# Patient Record
Sex: Male | Born: 1945 | Race: White | Hispanic: No | Smoking: Former smoker
Health system: Southern US, Community
[De-identification: ages and names within clinical notes are randomized; demographics above are authoritative.]

## PROBLEM LIST (undated history)

## (undated) DIAGNOSIS — I10 Essential (primary) hypertension: Secondary | ICD-10-CM

## (undated) DIAGNOSIS — I959 Hypotension, unspecified: Secondary | ICD-10-CM

## (undated) DIAGNOSIS — I209 Angina pectoris, unspecified: Secondary | ICD-10-CM

## (undated) DIAGNOSIS — I4891 Unspecified atrial fibrillation: Secondary | ICD-10-CM

## (undated) DIAGNOSIS — K219 Gastro-esophageal reflux disease without esophagitis: Secondary | ICD-10-CM

## (undated) DIAGNOSIS — M316 Other giant cell arteritis: Secondary | ICD-10-CM

## (undated) DIAGNOSIS — M1990 Unspecified osteoarthritis, unspecified site: Secondary | ICD-10-CM

## (undated) DIAGNOSIS — I482 Chronic atrial fibrillation, unspecified: Secondary | ICD-10-CM

## (undated) DIAGNOSIS — R51 Headache: Secondary | ICD-10-CM

## (undated) DIAGNOSIS — Z8489 Family history of other specified conditions: Secondary | ICD-10-CM

## (undated) DIAGNOSIS — Z9289 Personal history of other medical treatment: Secondary | ICD-10-CM

## (undated) DIAGNOSIS — R519 Headache, unspecified: Secondary | ICD-10-CM

## (undated) DIAGNOSIS — K5900 Constipation, unspecified: Secondary | ICD-10-CM

## (undated) DIAGNOSIS — I509 Heart failure, unspecified: Secondary | ICD-10-CM

## (undated) DIAGNOSIS — G4730 Sleep apnea, unspecified: Secondary | ICD-10-CM

## (undated) DIAGNOSIS — I499 Cardiac arrhythmia, unspecified: Secondary | ICD-10-CM

## (undated) DIAGNOSIS — R0600 Dyspnea, unspecified: Secondary | ICD-10-CM

## (undated) DIAGNOSIS — K649 Unspecified hemorrhoids: Secondary | ICD-10-CM

## (undated) DIAGNOSIS — G4700 Insomnia, unspecified: Secondary | ICD-10-CM

## (undated) HISTORY — PX: CHOLECYSTECTOMY: SHX55

## (undated) HISTORY — PX: COLONOSCOPY W/ POLYPECTOMY: SHX1380

## (undated) HISTORY — PX: EYE SURGERY: SHX253

## (undated) HISTORY — PX: APPENDECTOMY: SHX54

## (undated) HISTORY — PX: ESOPHAGOSCOPY: SUR460

## (undated) HISTORY — PX: HERNIA REPAIR: SHX51

## (undated) HISTORY — PX: JOINT REPLACEMENT: SHX530

## (undated) HISTORY — PX: VERTICAL BANDED GASTROPLASTY: SHX1102

---

## 1998-10-29 ENCOUNTER — Encounter: Payer: Self-pay | Admitting: Orthopedic Surgery

## 1998-10-29 ENCOUNTER — Ambulatory Visit (HOSPITAL_COMMUNITY): Admission: RE | Admit: 1998-10-29 | Discharge: 1998-10-29 | Payer: Self-pay | Admitting: Orthopedic Surgery

## 2000-03-18 ENCOUNTER — Encounter: Admission: RE | Admit: 2000-03-18 | Discharge: 2000-06-16 | Payer: Self-pay | Admitting: Anesthesiology

## 2000-05-13 ENCOUNTER — Emergency Department (HOSPITAL_COMMUNITY): Admission: EM | Admit: 2000-05-13 | Discharge: 2000-05-13 | Payer: Self-pay | Admitting: Emergency Medicine

## 2000-06-06 ENCOUNTER — Emergency Department (HOSPITAL_COMMUNITY): Admission: EM | Admit: 2000-06-06 | Discharge: 2000-06-06 | Payer: Self-pay | Admitting: Emergency Medicine

## 2000-06-06 ENCOUNTER — Encounter: Payer: Self-pay | Admitting: Emergency Medicine

## 2000-07-03 ENCOUNTER — Encounter: Admission: RE | Admit: 2000-07-03 | Discharge: 2000-10-01 | Payer: Self-pay | Admitting: Anesthesiology

## 2001-02-18 ENCOUNTER — Emergency Department (HOSPITAL_COMMUNITY): Admission: EM | Admit: 2001-02-18 | Discharge: 2001-02-18 | Payer: Self-pay | Admitting: *Deleted

## 2002-01-18 ENCOUNTER — Encounter: Admission: RE | Admit: 2002-01-18 | Discharge: 2002-04-18 | Payer: Self-pay | Admitting: Internal Medicine

## 2002-10-08 ENCOUNTER — Emergency Department (HOSPITAL_COMMUNITY): Admission: EM | Admit: 2002-10-08 | Discharge: 2002-10-08 | Payer: Self-pay | Admitting: Emergency Medicine

## 2003-09-05 ENCOUNTER — Ambulatory Visit (HOSPITAL_COMMUNITY): Admission: RE | Admit: 2003-09-05 | Discharge: 2003-09-05 | Payer: Self-pay | Admitting: Urology

## 2003-09-11 ENCOUNTER — Ambulatory Visit (HOSPITAL_BASED_OUTPATIENT_CLINIC_OR_DEPARTMENT_OTHER): Admission: RE | Admit: 2003-09-11 | Discharge: 2003-09-11 | Payer: Self-pay | Admitting: Internal Medicine

## 2004-02-12 ENCOUNTER — Encounter: Admission: RE | Admit: 2004-02-12 | Discharge: 2004-02-12 | Payer: Self-pay | Admitting: Internal Medicine

## 2004-05-27 ENCOUNTER — Encounter: Admission: RE | Admit: 2004-05-27 | Discharge: 2004-05-27 | Payer: Self-pay | Admitting: General Surgery

## 2004-09-01 DIAGNOSIS — I209 Angina pectoris, unspecified: Secondary | ICD-10-CM

## 2004-09-01 HISTORY — DX: Angina pectoris, unspecified: I20.9

## 2004-09-16 ENCOUNTER — Inpatient Hospital Stay (HOSPITAL_COMMUNITY): Admission: RE | Admit: 2004-09-16 | Discharge: 2004-09-20 | Payer: Self-pay | Admitting: Orthopedic Surgery

## 2004-09-16 ENCOUNTER — Ambulatory Visit: Payer: Self-pay | Admitting: Physical Medicine & Rehabilitation

## 2005-03-25 ENCOUNTER — Encounter: Admission: RE | Admit: 2005-03-25 | Discharge: 2005-03-25 | Payer: Self-pay | Admitting: Internal Medicine

## 2005-06-30 ENCOUNTER — Ambulatory Visit (HOSPITAL_COMMUNITY): Admission: RE | Admit: 2005-06-30 | Discharge: 2005-06-30 | Payer: Self-pay | Admitting: Cardiology

## 2005-09-26 ENCOUNTER — Inpatient Hospital Stay (HOSPITAL_COMMUNITY): Admission: RE | Admit: 2005-09-26 | Discharge: 2005-10-01 | Payer: Self-pay | Admitting: Orthopedic Surgery

## 2005-11-26 ENCOUNTER — Ambulatory Visit: Payer: Self-pay | Admitting: Family Medicine

## 2006-01-01 ENCOUNTER — Ambulatory Visit: Payer: Self-pay | Admitting: Family Medicine

## 2006-01-23 ENCOUNTER — Encounter
Admission: RE | Admit: 2006-01-23 | Discharge: 2006-04-23 | Payer: Self-pay | Admitting: Physical Medicine & Rehabilitation

## 2006-02-05 ENCOUNTER — Encounter: Admission: RE | Admit: 2006-02-05 | Discharge: 2006-03-10 | Payer: Self-pay | Admitting: Orthopedic Surgery

## 2006-02-12 ENCOUNTER — Ambulatory Visit: Payer: Self-pay | Admitting: Family Medicine

## 2006-03-06 ENCOUNTER — Ambulatory Visit: Payer: Self-pay | Admitting: Physical Medicine & Rehabilitation

## 2006-04-28 ENCOUNTER — Encounter: Admission: RE | Admit: 2006-04-28 | Discharge: 2006-04-28 | Payer: Self-pay | Admitting: Specialist

## 2006-05-20 ENCOUNTER — Ambulatory Visit: Payer: Self-pay | Admitting: Family Medicine

## 2006-06-29 ENCOUNTER — Encounter: Admission: RE | Admit: 2006-06-29 | Discharge: 2006-07-27 | Payer: Self-pay | Admitting: Specialist

## 2006-07-20 ENCOUNTER — Ambulatory Visit: Payer: Self-pay | Admitting: Family Medicine

## 2006-08-03 ENCOUNTER — Ambulatory Visit: Payer: Self-pay | Admitting: Family Medicine

## 2006-09-03 ENCOUNTER — Ambulatory Visit: Payer: Self-pay | Admitting: Family Medicine

## 2006-10-23 ENCOUNTER — Ambulatory Visit: Payer: Self-pay | Admitting: Family Medicine

## 2006-11-09 ENCOUNTER — Ambulatory Visit: Payer: Self-pay | Admitting: Family Medicine

## 2006-11-20 ENCOUNTER — Ambulatory Visit: Payer: Self-pay | Admitting: Family Medicine

## 2006-12-04 ENCOUNTER — Ambulatory Visit: Payer: Self-pay | Admitting: Family Medicine

## 2006-12-24 ENCOUNTER — Ambulatory Visit (HOSPITAL_COMMUNITY): Admission: RE | Admit: 2006-12-24 | Discharge: 2006-12-24 | Payer: Self-pay | Admitting: Gastroenterology

## 2006-12-24 ENCOUNTER — Encounter (INDEPENDENT_AMBULATORY_CARE_PROVIDER_SITE_OTHER): Payer: Self-pay | Admitting: Specialist

## 2007-01-08 ENCOUNTER — Ambulatory Visit: Payer: Self-pay | Admitting: Physician Assistant

## 2007-01-26 ENCOUNTER — Ambulatory Visit: Payer: Self-pay | Admitting: Family Medicine

## 2007-05-11 ENCOUNTER — Ambulatory Visit (HOSPITAL_COMMUNITY): Admission: RE | Admit: 2007-05-11 | Discharge: 2007-05-11 | Payer: Self-pay | Admitting: Family Medicine

## 2007-09-23 ENCOUNTER — Ambulatory Visit (HOSPITAL_COMMUNITY): Admission: RE | Admit: 2007-09-23 | Discharge: 2007-09-23 | Payer: Self-pay | Admitting: Cardiology

## 2009-12-03 ENCOUNTER — Ambulatory Visit (HOSPITAL_COMMUNITY): Admission: RE | Admit: 2009-12-03 | Discharge: 2009-12-03 | Payer: Self-pay | Admitting: Orthopedic Surgery

## 2010-03-14 ENCOUNTER — Ambulatory Visit (HOSPITAL_COMMUNITY): Admission: RE | Admit: 2010-03-14 | Discharge: 2010-03-14 | Payer: Self-pay | Admitting: Gastroenterology

## 2011-01-14 NOTE — Op Note (Signed)
[name], [name]                 ACCOUNT NO.:  [id number]   MEDICAL RECORD NO.:  [id number]          PATIENT TYPE:  OIB   LOCATION:  2899                         FACILITY:  [facility]   PHYSICIAN:  Francisca December, M.D.  DATE OF BIRTH:  [birth year]   DATE OF PROCEDURE:  09/23/2007  DATE OF DISCHARGE:  09/23/2007                               OPERATIVE REPORT   PROCEDURE PERFORMED:  Explant Medtronic Reveal Plus loop recorder.   INDICATIONS:  End-of-life battery on this device.  It is no longer  responsive to interrogation.   PROCEDURAL NOTE:  The patient was brought to the cardiac catheterization  laboratory in the fasting state.  The mid chest was prepped and draped  in the usual sterile fashion.  Local anesthesia was obtained with the  infiltration of 1% lidocaine over the site of the loop recorder.  There  was a small keloid scar present there.   A 2-3 cm incision was made below this keloid, and this was carried down,  by sharp dissection, to the capsule of the loop recorder.  The capsule  was incised.  The device was then manipulated outside of the body, and  the anchoring sutures removed as well.  The wound was then copiously  irrigated using 1% kanamycin solution.  The wound was then closed using  2-0 Vicryl, in a running fashion, for the subcutaneous layer, the skin  was approximated using 4.0 vicryl in a subcuticular fashion.  Steri-  Strips and a sterile dressing were applied, and the patient was  transported to the day cath center in stable condition.      Francisca December, M.D.  Electronically Signed     JHE/MEDQ  D:  09/23/2007  T:  09/24/2007  Job:  [id number]

## 2011-01-17 NOTE — Op Note (Signed)
[name], [name]                 ACCOUNT NO.:  [id number]   MEDICAL RECORD NO.:  [id number]          PATIENT TYPE:  AMB   LOCATION:  ENDO                         FACILITY:  [facility]   PHYSICIAN:  Petra Kuba, M.D.    DATE OF BIRTH:  [birth year]   DATE OF PROCEDURE:  12/24/2006  DATE OF DISCHARGE:                               OPERATIVE REPORT   PROCEDURE PERFORMED:  Colonoscopy with polypectomy.   ENDOSCOPIST:  Petra Kuba, M.D.   INDICATIONS FOR PROCEDURE:  Patient with history of colon polyps, due  for repeat screening.  Consent was signed after the risks, benefits,  methods and options were thoroughly discussed in the office.   MEDICINES USED:  Per anesthesia, Diprivan, fentanyl and Versed.   DESCRIPTION OF PROCEDURE:  Rectal inspection was pertinent for external  hemorrhoids, small.  Digital exam was negative.  A video colonoscope was  inserted and with some difficulty due to lots of fluid left in the colon  as well as some spasm, we were able to advance to the cecum.  This did  not require any abdominal pressure or any position changes.  No  abnormalities were seen on insertion.  The cecum was identified by the  appendiceal orifice and the ileocecal valve. In the cecal pole along the  fold was a questionable small sessile polyp which was cold biopsied x3.  The scope was slowly withdrawn.  One rare ascending diverticulum was  seen.  At the approximate level of the hepatic flexure, questionable  edematous fold versus a small sessile polyp was seen and was cold  biopsied x3 and put in a separate container.  The scope was further  withdrawn.  There was a rare left-sided diverticulum.  The prep overall  was adequate but over 1 L had to be washed and suctioned.  As we  withdrew back through the sigmoid, in the distal sigmoid a small sessile  polyp was seen, snared, electrocautery applied and the polyp was  suctioned through the scope and collected in the trap and put in the  third bottle.  Scope was withdrawn back to the rectum and retroflexed,  pertinent for some small internal hemorrhoids.  Anorectal pullthrough  confirmed the above.  Scope was reinserted a short ways up the left side  of the colon, air was suctioned, scope removed.  The patient tolerated  the procedure adequately.  There was no immediate obvious complication.   ENDOSCOPIC DIAGNOSIS:  1. Internal and external small hemorrhoids.  2. Rare left greater than 1 right diverticulum seen.  3. Sigmoid distal small polyp snared.  4. Questionable abnormal hepatic flexure fold versus polyp status post      biopsy.  5. Small cecal polyp cold biopsied.  6. Otherwise within normal limits to the cecum.   PLAN:  Await pathology to determine future colonic screening.  Barring a  surprise, repeat in three to five years.  May consider increased prep to  clean him out even better.  Probably will need Diprivan again.  Based on  his size, sleep apnea etc.  Happy to see back  p.r.n.           ______________________________  Petra Kuba, M.D.     MEM/MEDQ  D:  12/24/2006  T:  12/24/2006  Job:  [id number]   cc:   Delaney Meigs, M.D.  Fax: [phone]

## 2011-01-17 NOTE — H&P (Signed)
[facility]  Patient:    [name], [name]                        MRN: [id number] Adm. Date:  [id number] Attending:  Thyra Cooper CC:         Frank Hair. Vaughan Basta., M.D.  [name]. Cooper, M.D., [facility]   History and Physical  FOLLOW-UP EVALUATION  HISTORY OF PRESENT ILLNESS:  [name] comes in for follow-up evaluation.  He stated that he was doing well up until a couple of nights ago.  He is developing more problems with cramping in his back and his leg.  [name], M.D., is in the process of evaluating his back more fully at [facility].  They have not found a scanner that will hold his weight.  He has come down off of his Flexeril and has reduced his Darvocet to just at night in addition to the OxyContin.  PHYSICAL EXAMINATION:  Blood pressure 137/58, heart rate 86, respiratory rate 24, O2 saturation 96%.  He moves with great caution today.  IMPRESSION: 1. Low back pain with radicular quality, being worked up at [facility]. 2. Atrial fibrillation per [name], M.D. 3. 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 4. Other medical problems per Frank Hair. Vaughan Basta., M.D.  DISPOSITION: 1. Continue on the OxyContin 40 mg two p.o. b.i.d., #120 with no refill. 2. Continue with the Darvocet p.r.n. 3. Zanaflex 2 mg one p.o. q.p.m. x 7 days, then one p.o. b.i.d. x 7 days, and    then one p.o. q.8h.  He was advised of the potential side effects of this    medication.  Given #100 with two refills. 4. Follow up with me in four weeks. DD:  07/03/00 TD:  07/05/00 Job: [id number]

## 2011-01-17 NOTE — H&P (Signed)
[name], [name]                 ACCOUNT NO.:  [id number]   MEDICAL RECORD NO.:  [id number]          PATIENT TYPE:  INP   LOCATION:  NA                           FACILITY:  [facility]   PHYSICIAN:  Ollen Gross, M.D.    DATE OF BIRTH:  [birth year]   DATE OF ADMISSION:  09/26/2005  DATE OF DISCHARGE:                                HISTORY & PHYSICAL   Date of office visit, history and physical September 09, 2005. Date of  admission September 26, 2005.   CHIEF COMPLAINT:  Left hip pain.   HISTORY OF PRESENT ILLNESS:  The patient is a 65 year old male well known to  Dr. Homero Fellers Aluisio. He has previously undergone a right total hip in January  of 2006 and done extremely well with this. He had known history of bilateral  hip arthritis. He has done so well with this previous hip surgery he was  felt to be an excellent candidate for the second. He comes in now to have  the left hip done.   ALLERGIES:  No known drug allergies.   CURRENT MEDICATIONS:  1.  Strattera 100 milligrams daily.  2.  Effexor 3 tablets daily.  3.  Metamucil 2 tablespoons b.i.d.  4.  Mobic 15 milligrams b.i.d.  5.  Methadone 2 tablets t.i.d.   PAST MEDICAL HISTORY:  1.  Hypertension.  2.  History of prostatitis.  3.  Sleep apnea.  4.  Morbid obesity.  5.  History of paroxysm atrial fibrillation. He does have an implanted loop      recorder.   PAST SURGICAL HISTORY:  1.  Implanted loop recorder.  2.  Appendectomy.  3.  Right eye surgery.  4.  Gastroplasty.  5.  Incisional hernia repair.  6.  Right total hip.   FAMILY HISTORY:  Mother deceased at age 40 with a history of cancer and  smoking. Father deceased at age 65 with a history of alcohol use/abuse.   SOCIAL HISTORY:  Single. Two children and one grandchild. Quit smoking  approximately 23 years ago. Smoked about 20 years, two pack per day. Very  seldom intake of alcohol.   REVIEW OF SYSTEMS:  GENERAL:  No fevers, chills or nights sweats.  NEUROLOGICAL:   No seizures, syncope, or paralysis. RESPIRATORY:  No  shortness of breath, productive cough or hemoptysis. He does have sleep  apnea. CARDIOVASCULAR:  No chest pain, angina or orthopnea. History of  irregular heart rate and atrial fibrillation. GI:  No nausea, vomiting. No  diarrhea but he does have some intermittent constipation due to his  medications. GU:  No dysuria or hematuria. Past history of prostatitis.   PHYSICAL EXAMINATION:  VITAL SIGNS:  Pulse 76, respirations 16, blood  pressure 126/68.  GENERAL:  A 65 year old white male well-developed, well-nourished,  overweight, morbidly obese. He is alert, oriented, cooperative and very  pleasant.  HEENT:  Normocephalic and atraumatic. Pupils are equal, round, and reactive.  Oropharynx clear. Noted to wear glasses.  CHEST:  Chest wall is clear on anterior and posterior chest wall. He does  have a small subcutaneous  implant left anterior chest wall which is a loop  recorder.  HEART:  Regular rhythm. No murmur.  ABDOMEN:  Soft, large, round, protuberant abdomen. Faint bowel sounds are  present.  RECTAL:  Not done, not pertinent to present illness.  BREASTS:  Not done, not pertinent to present illness.  GENITALIA:  Not done, not pertinent to present illness.  EXTREMITIES:  Left hip has limited range of motion. Only able to flex the  hip up to 90 degrees. There is no internal rotation, about 10 degrees of  external rotation, and only about 10 degrees of abduction. He ambulates with  antalgic gait.   IMPRESSION:  1.  Osteoarthritis left hip.  2.  Sleep apnea.  3.  Hypertension.  4.  Morbid obesity.  5.  Irregular heart rate/atrial fibrillation.  6.  History of prostatitis.   PLAN:  The patient will be admitted to [facility] to undergo a  left total hip arthroplasty. Surgery will be performed by Dr. Ollen Gross.      Alexzandrew L. Julien Girt, P.A.      Ollen Gross, M.D.  Electronically Signed    ALP/MEDQ  D:   09/25/2005  T:  09/26/2005  Job:  [id number]   cc:   Ollen Gross, M.D.  Fax: [phone]   Ike Bene, M.D.  Fax: [phone]   Tish Frederickson. Earlene Plater, M.D.  Fax: [phone]

## 2011-01-17 NOTE — Op Note (Signed)
NAMEKEAN, GAUTREAU                 ACCOUNT NO.:  [id number]   MEDICAL RECORD NO.:  [id number]          PATIENT TYPE:  INP   LOCATION:  1506                         [facility]ACILITY:  [facility]   PHYSICIAN:  Ollen Gross, M.D.    DATE OF BIRTH:  November 26, 1945   DATE OF PROCEDURE:  09/26/2005  DATE OF DISCHARGE:                                 OPERATIVE REPORT   PREOPERATIVE DIAGNOSIS:  Osteoarthritis left hip.   POSTOPERATIVE DIAGNOSIS:  Osteoarthritis left hip.   PROCEDURE:  Left total hip arthroplasty.   SURGEON:  Ollen Gross, M.D.   ASSISTANT:  Alexzandrew L. Julien Girt, P.A.   ANESTHESIA:  General.   ESTIMATED BLOOD LOSS:  300.   DRAINS:  Hemovac x1.   COMPLICATIONS:  None.   CONDITION:  Stable to recovery.   BRIEF CLINICAL NOTE:  [name] is a 65 year old male with end-stage  osteoarthritis of the left hip with intractable pain. He has had a previous  successful right total hip arthroplasty and presents now for left total hip  arthroplasty.   PROCEDURE IN DETAIL:  After successful administration of general anesthetic,  the patient is placed in the right lateral decubitus position with the left  side up and held with the hip positioner. The left lower extremity was  isolated from the perineum with plastic drapes and prepped and draped in the  usual sterile fashion. A standard posterolateral incision was made with a 10  blade through a very thick layer of subcutaneous tissue about 8 inches thick  down to the level of his fascia lata which was incised in line with the skin  incision. The sciatic nerve was palpated and protected and the short  rotators isolated off the femur. Capsulectomy was performed and the hips  dislocated. The center of the femoral head is marked and a trial prosthesis  placed such that the center of the trial head corresponds to the center of  his native femoral head. The osteotomy line is marked on the femoral neck  and osteotomy made with an oscillating saw.  The femur was retracted  anteriorly to gain acetabular exposure.   Acetabular osteophytes and labrum were removed. Reaming starts at 47 mm  coursing up in increments of 2 to 55 mm and then a 56 mm pinnacle acetabular  shell was placed in anatomic position and transfixed with two dome screws. A  trial 36 mm neutral liner is placed.   The femur is repaired with the canal finder and irrigation. Axial reaming is  performed up to 17.5 mm, proximal reaming to an30F and the sleeve machined  to a large. An 30F large trial sleeve is placed and 18 x 13 stem and 36 plus  12 neck. We matched his native anteversion. A 36 plus 0 head is placed and  the hip is reduced with outstanding stability. He had full extension and  full external rotation, 70 degrees flexion, 40 degrees adduction, 90 degrees  internal rotation and 90 degrees of flexion and about 70 degrees internal  rotation. By placing the left leg on the right, I felt as though the  leg  lengths were equal. The hip is then dislocated and all trials are removed.  The permanent apex hole eliminator is placed into the acetabular shell and  then the permanent 36 mm neutral Ultamet metal liner is placed. It is a  metal-on-metal hip replacement. The permanent 45F large sleeve is placed  with a 22 x 17 stem and a 36 plus 12 neck again matching his native  anteversion. The permanent 36 plus 0 head is placed and the hip is reduced  with the same stability parameters. The wound was copiously irrigated with  saline solution and the short external rotators reattached to the femur  through drill holes. The fascia lata was closed over a Hemovac drain with  interrupted #1 Vicryl, subcu closed in multiple layers of #1and 2-0 Vicryl  and subcuticular with running 4-0 Monocryl. The drain is hooked to suction,  incision cleaned and dried and Steri-Strips and bulky sterile dressing  applied. He is then awakened and transported to recovery in stable   condition.      Ollen Gross, M.D.  Electronically Signed     FA/MEDQ  D:  09/26/2005  T:  09/27/2005  Job:  [id number]

## 2011-01-17 NOTE — Op Note (Signed)
[name], [name]                 ACCOUNT NO.:  [id number]   MEDICAL RECORD NO.:  [id number]          PATIENT TYPE:  OIB   LOCATION:  2852                         FACILITY:  [facility]   PHYSICIAN:  Francisca December, M.D.  DATE OF BIRTH:  1945/09/15   DATE OF PROCEDURE:  06/30/2005  DATE OF DISCHARGE:                                 OPERATIVE REPORT   PROCEDURE:  Implantable loop recorder insertion.   SURGEON:  Francisca December, M.D.   INDICATION:  A 65 year old man with intermittent presyncopal episodes.  Unable to capture an episode during external ambulatory loop recorder/event  recorder.   PROCEDURAL NOTE:  The patient was brought to cardiac catheterization  laboratory in a fasting state.  The left chest was prepped and draped in the  usual sterile fashion.  Local anesthesia was obtained with infiltration of  1% lidocaine with epinephrine throughout the left chest and the parasternal  region.  A 3-cm-long incision was made horizontally 2 fingerbreadths to the  left of the sternum.  This was carried down by sharp and blunt dissection to  the prepectoral fascia.  There, are plane was lifted and a pocket formed  inferiorly adequate to insert the small finger and 2-0 silk anchoring  sutures were placed in the pectoralis muscle and the device was placed in  the pocket.  The sutures were used to anchor the device superiorly.  The  pocket was then irrigated with 1% kanamycin solution.  The subcutaneous  tissue was closed using 2-0 Vicryl in a running fashion.  This skin was  approximated using 4-0 Vicryl in a running subcuticular fashion.  Steri-  Strips were applied as well as a sterile dressing and the patient was  transported to the recovery area in stable condition.   DEVICE INFORMATION:  This is a Medtronic Reveal Plus, serial number  [id number].  Huston Foley setting is less than 40 beats per minute; tachy setting  of the greater 165 beats per minute; 5 activated automatically and 3  patient-  activated available.      Francisca December, M.D.  Electronically Signed     JHE/MEDQ  D:  06/30/2005  T:  07/01/2005  Job:  [id number]   cc:   Ladell Pier, M.D.  Fax: [phone]

## 2011-01-17 NOTE — Discharge Summary (Signed)
NAMEJSHAWN, HURTA                 ACCOUNT NO.:  [id number]   MEDICAL RECORD NO.:  [id number]          PATIENT TYPE:  INP   LOCATION:  0457                         [facility]AC[facility]   PHYSICIAN:  Ollen Gross, M.D.    DATE OF BIRTH:  [birth year]   DATE OF ADMISSION:  09/16/2004  DATE OF DISCHARGE:  09/20/2004                                 DISCHARGE SUMMARY   ADMISSION DIAGNOSES:  1.  Osteoarthritis, right hip.  2.  Sleep apnea.  3.  Hypertension.  4.  Morbid obesity.  5.  Irregular heart rate (questionable paroxysmal atrial fibrillation).  6.  Recent prostatitis.   DISCHARGE DIAGNOSES:  1.  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  2.  Sleep apnea.  3.  Hypertension.  4.  Morbid obesity.  5.  Irregular heart rate (questionable paroxysmal atrial fibrillation).  6.  Recent prostatitis.  7.  Mild post operative blood loss anemia status post intraoperative      transfusion of 1 unit.   PROCEDURE:  Date of surgery was 09/16/04, right total hip arthroplasty, metal-  on-metal hip construction.  Surgeon was Dr. Ollen Gross, assisted by  Patrica Duel, PA-C.  Anesthesia:  General.  Blood loss:  800 cc.  Hemovac drain x 1.   CONSULTATIONS:  Rehab, Dr. Claudette Laws.   BRIEF HISTORY:  The patient is a 65 year old male with morbid obesity.  He  has lost down from 650 pounds to about 309.  He has had chronic severe right  hip pain.  He has plateaued out on his weight loss and would like to  exercise more and lose further weight, but he has a severely arthritic hip.  He was felt otherwise benefit undergoing a hip procedure.  Risks and  benefits have been discussed.  Patient was subsequently admitted to the  hospital.   LABORATORY DATA:  Preop CBC showed hemoglobin 12.6, hematocrit 38.1, white  cell count 5.9, differential within normal limits.  Postop hemoglobin 10.6,  last H&H 9.6 and 28.5.  PT and PTT preop  were 12.9 and 30 respectively with  INR 1.0.  Serial pro-times followed.  Last noted PT/INR 18.4 and 1.8.  Urinalysis on admission negative.  Blood group type A+.   EKG dated 09/12/04 showed a normal sinus rhythm, low-voltage QRS, no previous  tracing, performed by Dr. Lady Deutscher.   X-rays, two-view chest, 09/12/04 showed scarring in the left medial lung  base, no active disease.   HOSPITAL COURSE:  The patient was admitted to [facility], was  taken to the OR and underwent above-stated procedure without complications.  He did receive 1 unit of blood intraoperatively.  He tolerated the procedure  well, later sent to recovery room and then to orthopedic floor for continued  postoperative care.  Actually, he was transferred to the ICU for  postoperative monitoring due to his sleep apnea.  He did well and had fairly  good pain control.  He states he is doing better.  He was transferred out to  a regular  floor.  He started to get up out of bed with physical therapy.  He  was initially slow to progress with physical therapy; therefore, rehab  consult was called.  He was seen by rehab services, felt to be an  appropriate candidate for rehab SACU; however, unfortunately during the  hospital course a bed did not come available, so discharge planning assisted  in locating a skilled nursing facility bed.  FL2 was signed.  On day 2 after  surgery the dressing was changed.  Incision was healing well.  He had a  little bit of mild postop temperature.  This was treated with antipyretics  and incentive spirometer.  His hemoglobin had gotten down to 9.4 but then  started trending back up to 9.6 by day 3.  He started to progress a little  better with his physical therapy.  He started getting up.  His pain was  under good control.  On day 2 the PCA and IVs were discontinued.  He  continued to slowly progress through the week.  He was noted on 09/20/04, he  had been seen on rounds by Dr. [name].   He was progressing with his therapy  but still needed a little bit of assistance.  There were no SACU beds  available; however, after sending out the FL2 we did have a bed offer from  [facility].  Patient was in agreement.  Plans were made, and the patient was  discharged at that time.   DISCHARGE PLAN:  Patient is discharged to [facility].   FINAL DISCHARGE DIAGNOSIS:  See above.   DISCHARGE MEDICATIONS:  1.  Strattera 100 mg p.o. daily.  2.  Lotensin 20 mg daily.  3.  Effexor XR 150 mg p.o. daily.  4.  Methadone 30-mg tablets p.o. t.i.d.  5.  Colace 100 mg p.o. b.i.d.  6.  Coumadin as per pharmacy protocol.  He needs to be on Coumadin for a      total of 3 weeks after the date of surgery which was 09/16/04.  Titrate      the INR between 2.0 and 3.0.  Adjust Coumadin as needed.  7.  Senokot S p.o. p.r.n.  8.  Laxative of choice.  9.  Enema of choice.  10. Percocet 1-2 every 4-6 hours as needed for pain.  11. Tylenol 1-2 every 4-6 hours as needed for mild pain, temperature or      headache.  12. Robaxin 500 mg p.o. q.6-8h. p.r.n. spasm.  13. Magnesium citrate 300 mL p.o. p.r.n. constipation.   DISCHARGE DIET:  Cardiac diet, low-sodium diet.   ACTIVITY:  He is 25-50% partial weightbearing to the right lower extremity.  Continue with gait training, ambulation and ADLs.  Needs to be out of bed  minimum b.i.d.  Continue with PT and OT for gait training, ambulation and  ADLs.  Daily dressing changes.  May start showering; however, do no submerge  incision under water.  Hip precautions at all times per total hip protocol.   FOLLOW UP:  Patient will follow up in the office 2 to 2-1/2 weeks from date  of surgery.  Please contact the office at [phone] to arrange an appointment  time and transfer the patient for follow-up care.   DISPOSITION:  [facility] facility.   CONDITION ON DISCHARGE:  Improving.     Alex  ALP/MEDQ  D:  09/20/2004  T:  09/20/2004  Job:   [id number]   cc:   [name]  [facility]  Ike Bene, M.D.  [address]  [place]  [place] [address]  Fax: [phone]   Tish Frederickson. Earlene Plater, M.D.  [address]  [place]  [place]  [place] [address]  Fax: [phone]

## 2011-01-17 NOTE — H&P (Signed)
[facility]  Patient:    [name]                         MRN: [id number] Adm. Date:  [id number] Attending:  Thyra Breed CC:         Christean Grief. Hey, M.D., [facility], [place], [place]   History and Physical  FOLLOW-UP EVALUATION  HISTORY OF PRESENT ILLNESS:  [name] comes in for follow-up evaluation.  I currently have him on OxyContin 40 mg q.8h. in additional to Darvocet for breakthrough pain.  The patient notes that his nocturnal pain is excruciating. I finally have gotten records from Booth D. 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 Catalina Antigua, M.D.  The patient states that this was a numbness and tingling that was in both lower extremities.  His current pain is more of a discomfort out into the right lower extremity down to the thigh and out into the calf.  It is made worse by walking and improved by sitting, as well as by the OxyContin.  He denied any loss of control of his bowels or bladder.  The patient apparently has not had repeat nerve conduction studies.  The patients wife has expressed concerns that she is wasting her time coming to see [place] and I confronted her with this.  She stated that this was more to do with the fact that we have not gotten any outside records.  I advised her that we were continuing to treat him despite the lack of records and I concerned that he does have more of a radicular quality to his discomfort.  Because of his extreme weight, we are unable to get diagnostic studies performed in [place], [place].  PHYSICAL EXAMINATION:  Blood pressure 139/58, heart rate 86, respiratory rate 20, O2 saturation 98%, pain level 8/10, temperature 97 degrees.  The patient exhibits inability to sit for more than a couple of minutes without having to stand up and moving  about.  He did not take his second dose of OxyContin today and is in a lot of discomfort as a result.  The deep tendon reflexes at the knees were 1-2+ and symmetric with positive straight leg raise sign on the right side after 70 degrees.  IMPRESSION: 1. Low back pain which has a radicular quality today, which the patient states    is distinctly different from his previous discomfort.  He does not show any    change in his deep tendon reflexes today. 2. 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 Vaughan Basta., M.D.  DISPOSITION: 1. Increase OxyContin to 80 mg one p.o. b.i.d., #60 with no refill. 2. Continue Darvocet for rescue pain one to two p.o. q.6h. p.r.n., #100 with    no refill. 3. We will go ahead and refer him to [facility] A. Hey, M.D., at [facility] to see whether he is better able to    delineate the source of his discomfort and whether any surgical options can    be made to him, which I am concerned due to his morbid obesity will not be    an option. 4. Follow up with me in four weeks. DD:  04/30/00 TD:  04/30/00 Job: [id number]

## 2011-01-17 NOTE — H&P (Signed)
[name], [name]                 ACCOUNT NO.:  [id number]   MEDICAL RECORD NO.:  [id number]          PATIENT TYPE:  INP   LOCATION:  NA                           FACILITY:  [facility]   PHYSICIAN:  Ollen Gross, M.D.    DATE OF BIRTH:  10-24-1945   DATE OF ADMISSION:  09/16/2004  DATE OF DISCHARGE:                                HISTORY & PHYSICAL   CHIEF COMPLAINT:  Right hip pain.   HISTORY OF PRESENT ILLNESS:  The patient is a 65 year old male who has been  seen recently by Dr. Ollen Gross for a second opinion concerning his right  hip.  He has known history of osteoarthritis of the right hip and has been  seen and treated by another orthopedic surgeon regarding ongoing arthritis.  [name] has a history of morbid obesity and has weighed between 500 and 600  pounds in the past.  He has had a gastroplasty years ago which he did lose  nearly a 100 to 150 pounds.  However, he had a rebound in his weight gain  and at one time weighed over 600 pounds.  One day, he woke up and just felt  that he did not have that desire to eat excessively anymore.  He has dropped  his weight through dieting and exercise.  However, his hip has prevented him  from exercising recently.  He has felt that he has reached a plateau on his  weight loss.  He is just over 300 pounds at this time.  He was seen in  second opinion and found to have severe end-stage arthritis of the right  hip.  It is felt that he be an appropriate candidate for a hip replacement.  Risks and benefits of the procedure have been discussed with the patient at  length and he elects to proceed with surgery.   ALLERGIES:  No known drug allergies.   CURRENT MEDICATIONS:  1.  Strattera 100 mg q.d.  2.  Benazepril q.d. dosage unknown.  3.  Metamucil 1 tablespoon t.i.d.  4.  Mobic 15 mg b.i.d.  5.  Methadone dose unknown 3 tablets t.i.d.  6.  Cipro 500 mg for recent prostatitis treated by Dr. Earlene Plater.   PAST MEDICAL HISTORY:  1.   Hypertension.  2.  Sleep apnea.  3.  Morbid obesity.  4.  Irregular heart rate.  5.  Questionable paroxysm atrial fibrillation.  6.  Recent prostatitis, currently on antibiotics preoperatively.   PAST SURGICAL HISTORY:  1.  Appendectomy.  2.  Eye surgery.  3.  Gastroplasty.  4.  Incisional hernia repair.   FAMILY HISTORY:  Mother deceased at age 8 with history of cancer and  smoker.  Father deceased at age 7 with history of alcohol use and abuse.   SOCIAL HISTORY:  Single and has two children and one grandchild.  Quit  smoking approximately 22 years ago.  He smoked for about 20 years at two  packs a day.  Very seldom intake of alcohol.   REVIEW OF SYMPTOMS:  GENERAL:  No fevers, chills, or night sweats.  NEUROLOGICAL:  No seizures, syncope, or paralysis.  RESPIRATORY:  Does have  sleep apnea.  No shortness of breath at rest but he does have shortness of  breath on exertion.  No productive cough or hemoptysis.  CARDIOVASCULAR:  No  chest pain, angina, or orthopnea.  History of irregular heart rate.  Sounds  like it could possibly be atrial fibrillation.  GASTROINTESTINAL:  Rare  reflux which he takes Maalox or Tagamet for.  No nausea, vomiting, and no  diarrhea.  He does have constipation due to his medications.  GENITOURINARY:  Recent prostatitis with some dysuria.  He has been by Dr. Earlene Plater with Cipro.  Sounds like he will complete his Cipro prior to admission.   PHYSICAL EXAMINATION:  VITAL SIGNS:  Pulse 68, respirations 12, blood  pressure 120/72.  GENERAL:  A 65 year old white male well-developed, well-nourished, obese.  In no acute distress.  He is alert, oriented, and cooperative.  HEENT:  Normocephalic and atraumatic.  Pupils are equal, round, and  reactive.  The patient is noted to wear glasses.  EOMs are intact.  NECK:  Supple.  CHEST:  Clear anterior and posterior chest wall.  There are no murmur,  rhonchi, rales, or wheezing.  HEART:  Regular rate and rhythm.  S1  and S2 noted.  No murmurs.  ABDOMEN:  Large, round, protuberant abdomen.  Bowel sounds are present.  RECTAL:  Not done, not pertinent to present illness.  BREASTS:  Not done, not pertinent to present illness.  GENITALIA:  Not done, not pertinent to present illness.  EXTREMITIES:  To the right lower extremity, he has limited range of motion  of the right hip with pain on flexion.  Very minimal internal and external  rotation.  Motor function is intact.  He does ambulate with antalgic gait  using a cane.   IMPRESSION:  1.  Osteoarthritis right hip.  2.  Sleep apnea.  3.  Hypertension.  4.  Morbid obesity.  5.  Irregular heart rate (questionable paroxysm atrial fibrillation).  6.  Recent prostatitis.   PLAN:  The patient is admitted to [facility] to undergo a right  total hip arthroplasty.  Surgery will be performed by Dr. Ollen Gross.     Alex   ALP/MEDQ  D:  09/15/2004  T:  09/15/2004  Job:  [id number]   cc:   Ollen Gross, M.D.  [facility]  [address]  [address]  [place]  [place] [address]  Fax: [phone]   Ike Bene, M.D.  [address]  [place]  [place] [address]  Fax: [phone]   Tish Frederickson. Earlene Plater, M.D.  [address]  [place]  [place] [address]  Fax: [phone]

## 2011-01-17 NOTE — H&P (Signed)
[facility]: [facility]  Patient:    [name], [name]                        MRN: [id number] Adm. Date:  [id number] Attending:  Thyra Breed CC:         Hal T. Stoneking, M.D.             James P. Aplington, M.D.             Dr. Dorita Fray, [facility]             Dr. Donneta Romberg                         History and Physical  FOLLOW-UP EVALUATION:  [name] was doing well until he took a fall in his driveway this morning, and he has a lot of discomfort in his left leg and elbow, where he took the brunt of his fall.  Since his last visit, he was doing quite well up until two days ago, when he has noted that his pain has increased in severity.  He is scheduled to see Dr. Elvina Sidle later in October.  Since his last evaluation, he went into atrial fibrillation and was placed on Cardizem and Coumadin.  He has noted that the OxyContin 80 mg twice a day is helpful as long as he continues this with the Darvocet.  PHYSICAL EXAMINATION:  VITAL SIGNS:  Blood pressure 147/72, heart rate 84, respiratory rate 22, O2 saturation 96%, pain level is 5 out of 10, and temperature is 97.2.  MUSCULOSKELETAL/NEUROLOGIC:  He is quite tender on straight leg raise sign on the right side.  His deep tendon reflexes are unchanged from previously.  He has an excoriation on the left elbow.  IMPRESSION: 1. Increased pain acutely from recent fall with underlying chronic low back    pain syndrome radiating out into the right lower extremity, suggestive of a    radicular-type discomfort. 2. Atrial fibrillation per Dr. Pete Glatter. 3. DeQuervains of the left thumb per Dr. Simonne Come. 4. Other medical problems per Dr. Benedetto Goad.  DISPOSITION: 1. OxyContin 40 mg two p.o. b.i.d., #120 with no refill. 2. Continue on the Darvocet-N 100 one p.o. q.6h. p.r.n., #100 with no refill. 3. Follow up with me in four weeks. 4. The patient was encouraged to go ahead and follow up with Dr. Elvina Sidle in the  interim. DD:  06/02/00 TD:  06/02/00 Job: [id number]

## 2011-01-17 NOTE — H&P (Signed)
[facility]  Patient:    [name]                         MRN: [id number] Adm. Date:  [id number] Attending:  Thyra Breed CC:         Barbette Hair. Vaughan Basta., M.D.   History and Physical  FOLLOW UP EVALUATION  NOTE:  The patient was seen in follow up on April 01, 2000, and his dictation was not transcribed due to garbling.  This is a redictation made on April 07, 2000, the day that we discovered that the dictation was not transcribed.  HISTORY:  On his visit on April 01, 2000, the patient described the fact that he felt miserable.  He rated his pain at 8/10 and stated, before his treatment, it was 6/10.  He is not a candidate for an MRI due to his weight, and we have not been able to find an MRI that will accommodate him.  He is currently on OxyContin 40 mg, one b.i.d.  Wellbutrin, Celebrex, Lotensin, Neurontin, Tagamet, diazepam and Flexeril.  He is using Darvocet for breakthrough pain.  His pain is worse in the early morning hours before he awakens. He has pain that radiates out into his right buttock and posterior thigh.  PHYSICAL EXAMINATION:  VITAL SIGNS:  Blood pressure 140/69, heart rate 77, respiratory rate 18, O2 saturations 96%, pain level 8/10, temperature 97.5.  NEUROLOGIC:  Unchanged.  IMPRESSION:  Chronic low back pain with radiation into the right buttock region, which is suspect is a radiculopathy.  DISPOSITION: 1. Increase OxyContin to 40 mg t.i.d. 2. Continue with Darvocet for breakthrough, but use very sparingly. 3. The patient is to call us next week so that we can reasses his progress. 4. He is to see [place] back in follow up in approximately 2-4 weeks. DD:  04/07/00 TD:  04/07/00 Job: [id number]

## 2011-01-17 NOTE — Op Note (Signed)
NAMEMELBOURNE, [name]                 ACCOUNT NO.:  [id number]   MEDICAL RECORD NO.:  [id number]          PATIENT TYPE:  INP   LOCATION:  X002                         FACILITY:  [facility]   PHYSICIAN:  Ollen Gross, M.D.    DATE OF BIRTH:  1945/11/26   DATE OF PROCEDURE:  09/16/2004  DATE OF DISCHARGE:                                 OPERATIVE REPORT   PREOPERATIVE DIAGNOSIS:  Severe osteoarthritis of the right hip.   POSTOPERATIVE DIAGNOSIS:  Severe osteoarthritis of the right knee.   PROCEDURE:  Right total hip arthroplasty, metal on metal.   SURGEON:  Ollen Gross, M.D.   ASSISTANT:  Alexzandrew L. Julien Girt, P.A.   ANESTHESIA:  General.   ESTIMATED BLOOD LOSS:  800 mL.   DRAINS:  Hemovac x 1 .   COMPLICATIONS:  None.   CONDITION:  Stable to the recovery room.   BRIEF CLINICAL NOTE:  [name] is a 65 year old male with morbid obesity who  lost from 650 pounds down to 309 pounds and has had chronic, severe right  hip pain.  He would like to exercise and lose more weight but cannot because  of his severely arthritic hip.  He is in constant pain and has been  refractory to medications.  He presents now for right total hip  arthroplasty.   PROCEDURE IN DETAIL:  After successful administration of general anesthetic,  the patient was placed in the left lateral decubitus position with the right  side up and held with the hip positioner.  The right lower extremity was  isolated from his perineum with plastic drapes and prepped and draped in the  usual sterile fashion.  A long posterolateral incision was made with a 10  blade through a very thick layer of subcutaneous tissue down to the level of  the fascia lata which was incised in line with the skin incision.  Charnley  retractor was placed avoiding the sciatic nerve.  The sciatic nerve was  palpated and protected and then the short rotators were isolated off the  femur.  A capsulectomy was performed and the hip was dislocated.  He  had  massive osteophyte formation on the femoral head and neck.  We placed a  trial prosthesis such that the center of the trial had corresponded to the  center of his native femoral head.  The osteotomy line was marked on the  femoral neck and the osteotomy made with an oscillating saw.  The femoral  head was then removed and the femur retracted anteriorly to gain acetabular  exposure.  He had a massive rim of osteophyte circumferentially around the  acetabulum which I removed.  I also removed the labrum.  Reaming started at  47 mm coursing in increments of 2 to 57 and then a 58 mm pinnacle acetabular  shell was placed in anatomic position and transfixed with two dome screws.  The trial 36 mm neutral liner was placed.   The wound was prepared first with a canal finder and then irrigation.  Axial  reaming was performed with 17.5 mm, proximal reaming to a 62F,  and the  sleeve machine to a large.  The 56F large trial sleeve was placed with a 22  by 17 stem and a 36 plus 12 neck.  We tried a 36 plus 8 neck first and this  did not provide enough offset.  With the 36 plus 12, we had an excellent  offset.  We tried a 36 plus 0 head first.  With the 36 plus 0, he had full  extension, full external rotation, 70 degrees flexion, 40 degrees adduction,  and about 65 degrees of internal rotation before he started to sublux.  I  went to a 36 plus 6 head and with that, he had full extension, full external  rotation at 70 degrees flexion, 40 degrees abduction, and 90 degrees  internal rotation.  He had 90 degrees of flexion and 70 degrees of internal  rotation.  By placing the right leg on top of the left, the leg lengths  appeared to be equal.  The hip was then dislocated and all trials were  removed.  The apex hole eliminator was placed into the acetabular shell and  then the permanent 36 mm neutral Ultramet metal liner was placed into the  acetabular shell.  This was a metal on metal hip replacement.   The 56F large  sleeve was placed into the proximal femur with a 22 by 17 stem, 36 plus 12  neck.  This was about 10 degrees beyond his native anteversion which matched  at trial.  The 36 plus 6 head is then placed and the hip reduced with the  same stability parameters.   The wound was copiously irrigated with saline solution and the short rotator  was reattached to the femur through drill holes.  The fascia lata was closed  over a Hemovac drain with interrupted #1 Vicryl.  The subcutaneous layer was  closed in about five layers with #1 Vicryl and 2-0 Vicryl.  The subcuticular  was closed with running 4-0 Monocryl.  The incision was cleaned and dried,  Steri-Strips and a bulky, sterile dressing was applied.  The drain was  hooked to suction.  He was placed into a knee immobilizer, awakened, and  transported to recovery room in stable condition.     Drenda Freeze   FA/MEDQ  D:  09/16/2004  T:  09/16/2004  Job:  [id number]

## 2011-01-17 NOTE — Group Therapy Note (Signed)
PRIMARY CARE PHYSICIAN:  Delaney Meigs, M.D.   PURPOSE OF EVALUATION:  Evaluate for chronic diffuse pain.   HISTORY OF THE PRESENT ILLNESS:  [name] is a 65 year old obese gentleman  with history of chronic pain mostly of his bilateral hips, knees, shoulders,  and bilateral hands.  He has been told that he has osteoarthritis and been  treated for that in the past.   The patient has undergone a right total hip replacement with Dr. [name] in  January of 2006.  He subsequently underwent a left total hip replacement in  January 2007; again, with Dr. [name].  This was after the patient had lost  weight from a maximum of 680 pounds to a present weight of approximately 360  pounds  He reports that he still has knee pain, and was told by Dr. [name]  that if he lost another 50 pounds they would consider bilateral total knee  replacements.   The patient has been treated with methadone for an extended period of time,  i.e. for at least 2 years.  He reports that he was on OxyContin prior to  that, but the dose had increased to such a level that he and his primary  care physician, Dr. Lysbeth Galas, had decided to switch him to methadone.  He has  been using the methadone 10 mg 2 tablets t.i.d. and that dose has been  stable for about 1 year.  He reports that he was treated with methadone by  Dr. Benedetto Goad. The patient reports that he gets very good relief from the  methadone.  He uses only minimal amounts of Percocet 5/325 or approximately  1 tablet every other day for breakthrough pain usually related to exercise.  He tries to be as active as possible, given his overall health and he has  had the weight loss as noted above.   The patient was treated with an implantable loop recorder approximately  June 30, 2005 for a history of cardiac arrhythmias.  He reports that the  cardiac arrhythmias actually predated by several months the start of his  methadone.  He reports that his main arrhythmia  has been atrial  fibrillation; and that the last episode occurred approximately 8 months ago  and lasted for 3-4 minutes.  He continues to be treated by Dr. Ty Hilts  through [facility], and he gets that loop recorder checked  approximately every 3 months.   His main concern in the office today, is the history of arrhythmias that can  be secondary to methadone.  He wants to know if he is at risk, given the  fact that he uses methadone and has a history of arrhythmias, as noted  above.   PAST MEDICAL HISTORY:  1.  History of obesity with maximum weight of 680 pounds and a history of      cardiac arrhythmias specifically atrial fibrillation.  2.  Depression.  3.  Hypertension.  4.  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  5.  Left total hip replacement September 06, 2005.  6.  History of degenerative joint disease of the bilateral shoulders, knees      hips, and thumbs.  7.  Insomnia, chronic edema.  8.  Benign prostatic hypertrophy.  9.  Diverticulosis.  10. Prostatitis.  11. Sleep apnea with CPAP.  12. Gastroesophageal reflux disease.  13. Status post implantable loop recorder June 30, 2005 for arrhythmias.  14. History of negative cardiac catheter, May 02, 2005 with ejection  fraction of 63%.  15. Attention deficit disorder.  16. Prior appendectomy.  17. Prior eye surgery for strabismus.   ALLERGIES:  No known drug allergies.   FAMILY HISTORY:  His mother is deceased from cancer and his father is  deceased from alcohol abuse with a history of diabetes mellitus.   SOCIAL HISTORY:  The patient is divorced with 2 grown children and 1  grandchild.  He previously worked as a Surveyor, quantity for  [employer].  He is on Social Security/Medicare.  He denies tobacco use.  He  reports rare alcohol usage.   MEDICATIONS:  1.  Multivitamin daily.  2.  Vitamin D 400 International Units daily.  3.  Strattera 25 mg 4 tablets daily.  4.  Mobic 7.5 mg  b.i.d.  5.  Septra 800/160 one tablet b.i.d.  6.  Percocet 5/325 one to two tablets daily.  7.  Methadone 10 mg 2 tablets t.i.d.  8.  Effexor XR 150 mg 2 tablets daily.  9.  Rozerem 8 mg 1 tablet daily.   REVIEW OF SYSTEMS:  Positive for weight loss, constipation, urinary  retention and sleep apnea.   PHYSICAL EXAMINATION:  An overweight adult male.  Height 5 feet 10 inches,  weight 340 pounds.  He ambulates with a single-point cane.  He has  difficulty toe walking and heel walking, but has reasonable ambulation  abilities.  Bilateral upper extremity exam showed 5 minus/5 strength  throughout.  Bulk and tone were normal.  Reflexes were 2+ and symmetrical.  Shoulder range of motion was decreased with complaints of pain.  Bilateral  lower extremity exam showed 5 minus/5 strength throughout.  Bulk and tone  were normal.  Reflexes were 2+ and symmetrical.  Hip range of motion was  within functional limits.   IMPRESSION:  1.  History of chronic, diffuse, musculoskeletal pain of the bilateral hips,      knees, shoulders, and hands related to osteoarthritis.  2.  Morbid obesity with weight loss to 340 pounds from a maximum of 680      pounds.  3.  History of cardiac arrhythmias, specifically atrial fibrillation      predating start of methadone.  4.  History of bilateral total hip replacements within the past 2 years.   At the present time the patient is doing well on his methadone using 10 mg 2  tablets t.i.d.  He uses only minimal Percocet for breakthrough pain.  At  this point, his history of arrhythmias does not and appear to be at all  related to the methadone.  The main complicating factor for methadone is  prolongation of Q-T interval.  The patient seems to be an appropriate  candidate for the continuation of the methadone especially since he has had  an implantable loop recorder in that measures his Q-T interval on a regular basis.  As long as his cardiologist, Dr. Amil Amen, is  aware that he is on the  methadone, and there can be a prolongation in the Q-T interval I see no  reason to try a different medicine which may or may not have as good an  analgesic effect.  For that reason, I would recommend continuation of the  methadone at the present dose with monitoring of his Q-T interval  specifically with his cardiologist.  Again, there does not seem to be any  correlation with his present arrhythmia that he has specifically his atrial  fibrillation.  Even that has been under control and he  is on no heart rate  control medications, nor any anticoagulation medication.  It seems to be  that the last episode of atrial fibrillation was for only a few minutes and  that occurred more than 8 months ago.   The patient is fairly confident that is primary care physician is  comfortable continuing the methadone and writing that prescription along  with the p.r.n. minimal dose Percocet.  The patient understands that if Dr.  Lysbeth Galas is not comfortable prescribing that medication we will continue with  the prescriptions, we will certainly fill those through this office, but  that will require the patient coming to this office to pick up  prescriptions.  He reports that the drive is extensive and he would prefer  to get those through Dr. Joyce Copa office.  Again, the patient understands  that we will prescribe them if Dr. Joyce Copa office is unwilling or unable to  continue that prescription for him.   We will plan to see the patient in followup on an as needed basis.           ______________________________  Ellwood Dense, M.D.     DC/MedQ  D:  03/06/2006 15:11:57  T:  03/06/2006 16:22:54  Job #:  [id number]

## 2011-01-17 NOTE — Discharge Summary (Signed)
NAMEMARVELLE, CAUDILL                 ACCOUNT NO.:  [id number]   MEDICAL RECORD NO.:  [id number]          PATIENT TYPE:  INP   LOCATION:  1506                         FACILITY:  [facility]   PHYSICIAN:  Ollen Gross, M.D.    DATE OF BIRTH:  11-Aug-1946   DATE OF ADMISSION:  09/26/2005  DATE OF DISCHARGE:  10/02/2005                                 DISCHARGE SUMMARY   ADMISSION DIAGNOSES:  1.  Osteoarthritis left hip.  2.  Sleep apnea.  3.  Hypertension.  4.  Morbid obesity.  5.  Irregular heart rate/atrial fibrillation.  6.  History of prostatitis.   DISCHARGE DIAGNOSES:  1.  Osteoarthritis left hip, status post left total hip arthroplasty.  2.  Sleep apnea.  3.  Hypertension.  4.  Morbid obesity.  5.  Irregular heart rate/atrial fibrillation.  6.  History of prostatitis.   PROCEDURE:  September 26, 2005, left total hip.  Surgeon Dr. [name],  assistant Avel Peace, PA-C.  Anesthesia general.   CONSULTS:  Rehab services.   BRIEF HISTORY:  [name] is a 65 year old male with end-stage osteoarthritis of  the left hip, previous successful right total and now presents for a left  hip procedure.   LABORATORY DATA:  CBC on admission:  Hemoglobin 12.1, hematocrit 35.6, white  cell count 4.5, postop hemoglobin 10.7, last noted H&H 9.8 and 29.1.  PT/PTT  preop 13.6 and 30, respectively with an INR of 1.0 last noted.  Serial pro  times followed.  Last noted PT/INR 19.3 and 1.6.  Chem panel on admission  all within normal limits with the exception of a minimally elevated ALT of  43.  Serial BMETs were followed.  Electrolytes remained within normal  limits.  Preop UA negative.  Blood group type A positive.  Portable pelvis  film on September 26, 2005, satisfactory appearance of the left total hip.  EKG September 23, 2005:  Normal sinus rhythm, unconfirmed.   HOSPITAL COURSE:  Admitted to [facility], tolerated the procedure  well, later returned to the recovery room and started on PC and  p.o.  analgesics, using a CPAP at night.  Had a rough night through the night of  surgery, was doing a little bit better by the next day, having a fair amount  of spasms in the thigh.  Allowed the patient to be at partial weightbearing,  50%, started getting up with physical therapy.  The patient was seen by  rehab services postop and felt that he would either need inpatient rehab  services versus some type of short term skilled facility.  By day 2, he was  feeling a little bit better.  Spasms are still present but improved.  He was  placed on Coumadin.  PC, IVs, and Foleys were discontinued by day 2.  Day 3,  he was progressing.  He had already been up ambulating with physical  therapy.  He got up to a point where he was walking about 100 feet with  supervision.  Rehab coordinator said that he was progressing well and would  not need inpatient SACU  and could possibly do with home health; however, the  patient had no support at home, and he lives alone, could not go home.  Therefore, discharge planning sought bed availability for short term  nursing.  There were bed offers made.  He chose [facility].  He was seen on October 02, 2005, by Dr. [name] on rounds.  He is  progressing well, tolerating his medications.  He was ready to be  discharged, so he was transferred at that time.   DISCHARGE PLAN:  The patient to be transferred over to [facility] on October 02, 2005.   DISCHARGE DIAGNOSES:  Please see above.   DISCHARGE MEDICATIONS:   CURRENT MEDICATIONS:  1.  Coumadin protocol.  Need to titrate his Coumadin protocol for 3 weeks,      titrate the INR between 2.0-3.0.  2.  Colace 100 mg p.o. twice daily.  3.  Laxative Citrucel packet p.o. twice daily.  4.  Methadone 20 mg p.o. t.i.d.  5.  Effexor XR 225 p.o. daily.  6.  Strattera 100 mg daily.  7.  He is also on Lovenox 40 mg subcu injections every 24 hours.  Continue      Lovenox until his INR  is therapeutic.  It was 1.6 at time of discharge.  8.  Percocet 1-2 q.4-6h. p.r.n. pain.  9.  Tylenol 1-2 q.4-6h. p.r.n. for mild pain and headache.  10. Robaxin 500 mg p.o. q.6-8h. p.r.n. spasm.  11. Restoril 15-30 mg p.o. q.h.s. p.r.n.  12. Cepacol lozenges p.r.n.  13. Please note, continue Lovenox daily until his INR is therapeutic, then      Lovenox can be discontinued.   DIET:  Low sodium diet as tolerated.   ACTIVITY:  He is initially placed 50% partial weightbearing to the left  lower extremity, continue gait training ambulation and ADLs as per PT and  OT.  Hip precautions at all times.  Total hip protocol.  The patient may  start showering.  Dry dressing change to the left hip.   FOLLOW UP:  The patient is to follow up in the office about 2 weeks from  surgery.  Please contact the office at [phone] to arrange appointment time  and transfer of the patient 2 weeks postop.   DISPOSITION:  [facility].   CONDITION ON DISCHARGE:  Improving.      Alexzandrew L. Julien Girt, P.A.      Ollen Gross, M.D.  Electronically Signed    ALP/MEDQ  D:  10/01/2005  T:  10/01/2005  Job:  [id number]   cc:   [name], M.D.  Fax: [phone]   Tish Frederickson. Earlene Plater, M.D.  Fax: [phone]

## 2011-01-17 NOTE — Consult Note (Signed)
[facility]  Patient:    [name], [name]                        MRN: [id number] Proc. Date: 03/19/00 Adm. Date:  [id number] Attending:  Thyra Breed CC:         Barbette Hair. Vaughan Basta., M.D.                          Consultation Report  NEW PATIENT EVALUATION  HISTORY OF PRESENT ILLNESS:  [name] is a 65 year old gentleman who was sent to [place] by Dr. Merril Abbe for possible lumbar epidural steroid injection. The patient presents with 1 page of progress notes, no laboratory data and himself. He describes a history of back discomfort which began approximately 1 year ago. He described it as a pain that begins in his tail bone and radiates out predominantly into the right lower extremity effecting the posterior aspect of the thigh and the lateral aspect of the thigh. It began gradually. It was not associated with any trauma. Initially, there was a considerable amount of numbness. He has a previous history of numbness of the feet bilaterally and has been seen by Dr. Meryl Crutch in the past. He is not familiar with the results of the workup. He describes the pain as a sharp discomfort which radiates at times down the back of the leg. Certain positions will increase it to a stabbing discomfort. It has improved since he has been placed on OxyContin as well as certain changes in position. Sitting long periods will increase his discomfort. In the night, he has increased discomfort especially if he rolls over on his right side. He sleeps on his left side or on his back with his legs propped he does better. He has a history of sleep apnea syndrome and has been treated by Dr. Fannie Knee in the past for this and does have some problems since his weight has increased since the initial diagnosis. He has been treated with Ultram which was not beneficial, Darvocet which is partially beneficial, Neurontin which is predominantly for his peripheral neuropathy type discomfort  and OxyContin which he only takes 1 tablet at night and he does feel as though it is helpful. He has noted some difficulties with dribbling and has seen Dr. Etta Grandchild in the past for prostate and epididymitis problems. He has not followed up with him.  The patient has not had a great deal of radiographic evaluations because of his marked weight of greater than 450 pounds. His weight has effected his ability to get about but the pain in his right lower extremity and buttock has exacerbated this.  CURRENT MEDICATIONS:  Wellbutrin 150 mg t.i.d., Celebrex 200 mg b.i.d., Lotensin 20 mg q.d., Neurontin 300 mg 2 tablets t.i.d., OxyContin 40 mg at night, Tagamet p.r.n., diazepam p.r.n., Flexeril p.r.n., Darvocet.  ALLERGIES:  Bee stings.  FAMILY HISTORY:  Positive for lung problems in his mother as well as lung cancer and arthritis.  PAST SURGICAL HISTORY:  Significant for gastroplasty, appendectomy, and correction of amblyopia as a child.  SOCIAL HISTORY:  The patient works as a work Midwife. He does not smoke and rarely smokes alcohol.  ACTIVE MEDICAL PROBLEMS:  Sleep apnea syndrome, morbid obesity, hypertension, osteoarthritis of the knees, gastroesophageal reflux disease and prostatism.  REVIEW OF SYSTEMS:  GENERAL:  Negative. HEAD:  Negative. EYES: Significant for amblyopia. NOSE/MOUTH/THROAT:  Negative. EARS:  Significant for  occasional ear ache in the right ear. ALLERGY/IMMUNOLOGIC:  Negative. PULMONARY:  Negative. CARDIOVASCULAR:  Significant for chronic hypertension. GI:  Significant for gastroesophageal reflux disease.  GU:  Significant for epididymitis and prostatism. HEMATOLOGIC:  Negative.  ENDOCRINE:  Negative. CUTANEOUS:  Significant for seborrhea and possible psoriasis. PSYCHIATRIC: History of depression.  PHYSICAL EXAMINATION:  VITAL SIGNS:  Blood pressure is 177/94, heart rate 86, respiratory rate 18, O2 saturations 93%, pain level is 3/10, temperature is  97.4.  GENERAL:  This is a morbidly obese male who has obvious discomfort when he moves from the sitting to standing position. He is unable to sit in a traditional chair because of his size and has a great difficulty getting onto the exam table.  HEENT:  Normocephalic, atraumatic. Eyes, extraocular movements intact with conjunctivae and sclerae clear. Nose patent nares. Oropharynx was significant for seborrhea in his mustache with mucosa of the mucous membranes of the mouth and throat intact. He had reasonable dentition.  NECK:  Showed good range of motion with carotids 2+ and symmetric without bruits.  LUNGS:  Showed distant breath sounds as well as heart showed distant heart sounds.  ABDOMEN:  Markedly obese.  BACK:  He had prominent lordosis of the lumbar spine with negative straight leg raise signs on back exam.  EXTREMITIES:  The patient demonstrated folds of fat hanging over his knees bilaterally with some crepitus on range of motion of the knees. The calves were much smaller than his thighs. Radial pulses and dorsalis pedis pulse were 2+ and symmetric.  NEUROLOGIC:  The patient was oriented x 4. Cranial nerves II-XII are grossly intact. Deep tendon reflexes were symmetric in the upper examination, 1+ at the right knee, 2+ at the left knee and 1-2+ at the ankles with down going toes. Motor was 5/5 with symmetric bulk and tone. Sensation was significant for attenuated perception of touch over the lateral aspect of the right thigh. He had tenderness of the right sciatic notch and right piriformis muscle. His back discomfort was increased by extension and reduced by forward flexion. Additional sensory exam revealed that he had a stocking distribution, attenuation of pinprick to the mid calves bilaterally.  IMPRESSION: 1. Low back pain with radiation predominantly onto the right lower extremity    which may be on a basis of either spinal disease with neuroforaminal     stenosis versus SI joint dysfunction, versus piriformis syndrome with    limited access to evaluations at the present time.  2. Peripheral neuropathy with previous workup by Dr. Meryl Crutch which is pending. 3. Morbid obesity which is limiting the workup. 4. Hypertension per Dr. Merril Abbe. 5. Osteoarthritis of the knees per Dr. Merril Abbe. 6. Gastroesophageal reflux disease per Dr. Merril Abbe. 7. Prostatism per doctors MacArthur and Sural. 8. Sleep apnea which seems to have intensified over the years with poor    compliance for follow-up by the patient.  DISPOSITION: 1. I advised the patient that I needed to procure more of his outside records    before proceeding with the epidural steroid injection. In addition, I    advised him that I felt that he would probably benefit from having his    OxyContin ingested upward to try and get better control of his pain. The    patient has great difficulty sitting up and has told me that he cannot lay    down to have his epidural done at this time. Hopefully by improving his    pain control, we can allow this  to occur. We are limited not only by his    size but his limitations and ability to sit for a long enough period of    time to go ahead with the epidural on the exam table or lay on his side.  I have gone ahead and taken the liberty of increasing his OxyContin to 40 mg b.i.d. #60 with no refill. He has signed a controlled substance contract with [place]. He will continue on his cyclobenzaprine and diazepam as well as Tagamet. He is to stop his Ultram and propoxyphene. He is to continue his other medications per Dr. Merril Abbe. I plan to see him back in follow-up in about 2 weeks. Hopefully we will have more data on him. In the meantime, I am also ascertaining what the weight limits are on the MRI scanners in town.  DD: 03/19/00 TD:  03/23/00 Job: [id number]

## 2011-01-17 NOTE — Consult Note (Signed)
[facility]  Patient:    DINESH, ULYSSE                        MRN: [id number] Proc. Date: 05/13/00 Adm. Date:  [id number] Disc. Date: [id number] Attending:  Tobey Bride CC:         Barbette Hair. Vaughan Basta., M.D.   Consultation Report  IDENTIFYING DATA:  Mr. Hankerson is a very pleasant 65 year old white male who suffers from morbid obesity, hypertension, peripheral neuropathy, DJD, GERD and sleep apnea.  He is followed by Dr. Barbette Hair. Vaughan Basta.  Mr. Mitton was in his usual state of health until earlier day.  He developed rapid heart palpitations.  He felt a fullness in his chest but no chest pain.  He denied any shortness of breath, although he does have baseline shortness of breath due to his severe obesity.  He came to the emergency room for evaluation, where he was given IV Cardizem and within a couple of hours, had converted to normal sinus rhythm.  By the time I saw him, he was totally comfortable, no chest discomfort.  He states he has believed that he has had this before and it lasts just a brief period and goes away on its own.  ALLERGIES:  He has ? allergies to SOME TYPE OF ANTIDEPRESSANT MEDICATION.  CURRENT MEDICATIONS 1. Neurontin 300 mg p.o. t.i.d. 2. Celebrex 200 mg p.o. b.i.d. 3. Lotensin 20 mg a day. 4. Wellbutrin 150 mg t.i.d. 5. OxyContin 80 mg b.i.d. 6. Darvocet-N p.r.n. pain 7. Flexeril 10 mg t.i.d.  PAST MEDICAL HISTORY:  Remarkable for morbid obesity, hypertension, peripheral neuropathy, DJD specifically of the knees, GERD, sleep apnea and currently having problems with sciatica and has been seen in the pain clinic.  FAMILY HISTORY:  Mother died at age 74 of lung cancer and emphysema and was a heavy smoker.  Father died at age 59 and was an alcoholic.  Two brothers and a sister -- no chronic medical problems.  Two children in good health.  SOCIAL HISTORY:  The patient is married.  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  He stopped smoking 18 or 19 years ago.  He does occasionally consume alcohol.  REVIEW OF SYSTEMS:  Currently no headache.  Currently no resting shortness of breath or chest discomfort.  PHYSICAL EXAMINATION  VITAL SIGNS:  Blood pressure 127/80, pulse rate 70 and regular.  Telemetry shows normal sinus rhythm.  NECK:  No JVD.  LUNGS:  Clear.  HEART:  Regular rate and rhythm, without murmurs, rubs, or gallops.  ABDOMEN:  Grossly obese.  EXTREMITIES:  Lower extremities -- trace 1+ edema.  LABORATORY DATA AVAILABLE AT THIS TIME:  White count 4400, hemoglobin 12.3, platelet count 174,000, pro time 14.5 with an INR of 1.3.  Pending at this time are a CK, a TSH and chemistry values which will be called to me.  ASSESSMENT:  New-onset atrial fibrillation, spontaneously converted to normal sinus rhythm.  PLAN:  Will start Cardizem CD 240 mg a day for rate control.  If he does to back into atrial fibrillation, we will start him on Coumadin 5 mg a day.  He is to have a pro time repeat in two days, then we will get a note to Dr. Merril Abbe.  He will need followup in the office for setup of possible 2-D echocardiogram and then if he is doing well, possible Holter monitor to see if  he is going in and out of atrial fibrillation. DD:  05/13/00 TD:  05/14/00 Job: [id number]

## 2011-01-17 NOTE — Op Note (Signed)
NAME:  [name], [name]                           ACCOUNT NO.:  [id number]   MEDICAL RECORD NO.:  [id number]                   PATIENT TYPE:  OUT   LOCATION:  [facility]                                 FACILITY:  [facility]   PHYSICIAN:  [name], M.D.           DATE OF BIRTH:  May 21, 1946   DATE OF PROCEDURE:  09/05/2003  DATE OF DISCHARGE:                                 OPERATIVE REPORT   PREOPERATIVE DIAGNOSES:  1. Urinary incontinence.  2. Massive obesity.   POSTOPERATIVE DIAGNOSES:  1. Urinary incontinence.  2. Massive obesity.   OPERATION:  1. Bilateral renal ureter and bladder ultrasound.  2. Micturition study.   SURGEON:  [name], M.D.   ANESTHESIA:  None.   INDICATIONS FOR PROCEDURE:  [name] is a very nice 65 year old obese male  who presents with a one year history of incontinence.  He was well over 600  pounds and recently has lost down to under 400 pounds.  He does have  irritable bowel syndrome, high blood pressure, and chronic back pain.  He  has had some sciatica and neuropathies from the back situation.  He is  currently on methadone, Effexor, diazepam and other medications.  He has  tried Oxytrol patches which caused severe skin irritation and had to stop  it.  He has really not tried any other anticholinergics to his knowledge but  we felt that urodynamics was indicated. We could not perform it in the  office and it was felt that in the outpatient setting in the hospital was  most appropriate.  In addition, we felt bilateral renal ultrasound due to  the possible neurogenic situation was indicated.  Our ultrasound tech came  to the hospital with our ultrasound machine and performed a bilateral renal  ureter and bladder ultrasound utilizing a 3.5 MHz probe with longitudinal  transverse scans performed of the right and left kidney and bladder.  The  bladder was noted to have 459.1 mL and it appeared to be smooth walled.  Neither ureter was dilated. The  right kidney was noted to be 119.5 mm in  length with a 10.9 mm cortical mantle.  Transverse diameters were noted to  be 61.9 x 56.7 mm depth x width.  No hydronephrosis or masses were noted to  be present.  A similar scan performed of the left kidney, it was noted to be  107.4 mm in length with a 15.0 mm cortical mantle. Transverse diameters were  noted to be 72.2 x 62.4 mm depth x width with again no hydronephrosis or  masses noted to be present.  The impression is essentially normal bilateral renal ureteral ultrasound.  He had a significant volume in his bladder but he has really been supine and  not been able to urinate. The micturition study was then performed.   He was catheterized and the bladder was drained, urine appeared to be clear.  An optical probe was then placed, functional integrity was confirmed after  the machine had been calibrated utilizing a Valsalva maneuver and a cough  and urodynamics was performed with water flow at 50 mL per minute.  He was  noted to have his first sensation at approximately 40 mL.  At approximately  100 mL he had a small uninhibited contraction with pressures up to 25 cm of  water. He had a second, third and fourth uninhibited bladder contractions.  Although they were all low caliber 25-30 cm of water, he did feel  significant urge to urinate.  Flow was stopped at approximately 250 mL of  water.  The catheter was advanced out, the operative probe was maintained.  He was then asked to stand which he did so and again had some uninhibited  bladder contractions. He was asked to void.  He voided a total of 368 mL.  There was strained artifact. There did appear to be a detrusor contraction  and it was felt that there was probably a slight obstruction but he did  completely empty.  T1 pressure at maximum flow was 49 cm of water although  it was much higher throughout most of the flow.  Peak flow was 50 mL per  second, mean of 15 mL per second which was  a good flow curve.   IMPRESSION:  Renal ultrasound normal except for a large bladder capacity  which he had been holding his urine.  He does have uninhibited bladder  contractions with low volume sensation.  He had good flow at high pressures  although there was felt to be mild obstruction.   PLAN:  Time voiding every 2 hours during the day.  He was placed on Ditropan  XL 10 mg p.o. q.d. and will increase it to b.i.d. if necessary.  He was  placed on Flomax 0.4 mg p.o. q.h.s. and will try to taper the Effexor but he  will clear it with Dr. Merril Abbe before proceeding with that and he will see  [place] in the clinic in three months.                                               [name], M.D.    RLD/MEDQ  D:  09/05/2003  T:  09/05/2003  Job:  [id number]   cc:   [name], M.D.  [address]  [place]  [place] [address]  Fax: [phone]

## 2011-01-17 NOTE — H&P (Signed)
[facility]  Patient:    [name], [name]                        MRN: [id number] Adm. Date:  [id number] Attending:  Thyra Breed CC:         Michell Heinrich, M.D. [facility] IN THE [facility]  Barbette Hair. Vaughan Basta., M.D.   History and Physical  FOLLOWUP EVALUATION  HISTORY OF PRESENT ILLNESS:  [name] presents today for follow-up evaluation of his chronic low back pain with radiation out to the right lower extremity. Since his last evaluation, he has had an exacerbation of his lower back discomfort, and is very concerned about his work.  He is also noticing increasing weakness.  On July 20, 2000, he had a CT scan of his lumbar spine performed.  Apparently, this was in Dr. Madilyn Fireman office, and we do not know the results of this.  The patient has recently up abruptly and noted an exacerbation in his lower back discomfort.  It is radiating out into his right hip and knee, and he rates it a level of at least 10.  He is having difficulties with incomplete emptying of his bladder since he has been on the OxyContin.  He does feel as though his Zanaflex is helping him, and he does continue to take the Neurontin.  CURRENT MEDICATIONS: 1. OxyContin 40 mg two p.o. b.i.d. 2. Zanaflex 2 mg q.8h. 3. Neurontin 300 mg two tablets t.i.d.  PHYSICAL EXAMINATION:  VITAL SIGNS:  The patient is afebrile with vital signs stable.  NEUROLOGIC:  He exhibits a great deal of discomfort on straight leg raise signs on the right side.  Deep tendon reflexes are difficult to elicit, but he appears to have right knee at 0 to 1+, left at 2+, ankles are 1+ and symmetric.  IMPRESSION: 1. Low back pain radiating out into the right lower extremity which I suspect is radicular in quality. 2. 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 Meryl Crutch. 3. Morbid obesity. 4. Hypertension per Dr. Merril Abbe. 5. Gastroesophageal reflux disease per Dr. Merril Abbe. 6.  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  He sees Dr. Truett Perna. 7. Sleep apnea syndrome.  DISPOSITION: 1. Continue on OxyContin 40 mg two p.o. b.i.d. #120 with no refill. 2. Continue with Zanaflex, but increase it to one tablet in the morning, one    at midday, and two in the evening, #100 with 3 refills. 3. Continue on the current dose of Neurontin. 4. Medrol 40 mg IM and taper of prednisone starting at 40 mg q.d. x 2 days,    then taper by 10 mg every 2 days.  He was given a prescription for 20    tablets. 5. He is asking about work and if he is able to continue it at the rate that    he has previously been able to do.  He is apparently getting a lot of    pressure at work.  I advised him that he has had an acute exacerbation, and    it is very difficult to assess his work capacity at this point.  It is also    difficult to assess if he is a surgical candidate anyway.  He is not a    candidate for any block therapy here. 6. I advised the patient I would see him back in followup in four weeks, and    if we found out anything from Dr. Madilyn Fireman office  prior to him that we would    let him know. DD:  07/30/00 TD:  07/30/00 Job: [id number]

## 2011-05-07 ENCOUNTER — Ambulatory Visit (INDEPENDENT_AMBULATORY_CARE_PROVIDER_SITE_OTHER): Payer: Medicare Other | Admitting: Surgery

## 2011-05-13 ENCOUNTER — Ambulatory Visit (INDEPENDENT_AMBULATORY_CARE_PROVIDER_SITE_OTHER): Payer: Medicare Other | Admitting: Surgery

## 2011-12-21 ENCOUNTER — Other Ambulatory Visit: Payer: Self-pay | Admitting: Orthopedic Surgery

## 2011-12-21 MED ORDER — BUPIVACAINE LIPOSOME 1.3 % IJ SUSP: 20.0000 mL | Freq: Once | INTRAMUSCULAR | Status: DC

## 2011-12-21 MED ORDER — DEXAMETHASONE SODIUM PHOSPHATE 10 MG/ML IJ SOLN: 10.0000 mg | Freq: Once | INTRAMUSCULAR | Status: DC

## 2012-02-25 NOTE — H&P (Signed)
[name] DOB: [birth year]   Chief Complaint: right hip pain  History of Present Illness The patient is a 66 year old male who comes in today for a preoperative History and Physical. The patient is scheduled for a right acetabular revision vs total hip revision to be performed by Dr. Gus Rankin. Aluisio, MD at [facility] on Wednesday March 17, 2012 . He has had a previous total hip arthroplasty on that hip and for the last five months he has been having an increasing discomfort in the groin area. This is worse with weight bearing but also with testing motion. He has had no new trauma that he is aware of. His left total hip is not giving him any difficulties. At this time the patient is going to need a revision of his acetabular cup with mesh and bone graft vs total hip revision. He is on Methadone for pain management. We advised him he will need to get his pain medications from his pain management doctors. Due to failure of the prosthesis, the most predictable means for increased function and decreased pain in the right hip is a right acetabular revision vs total hip revision.    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 06/08/1998 Osteoarthrosis, local NOS, pelvis/thigh (715.35). 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 06/15/2009 Rheumatoid Arthritis Hypertension Sleep Apnea Gastroesophageal Reflux Disease Obesity  Allergies "antidepressants"   Family History Hypertension. mother and father Drug / Alcohol Addiction. mother and father Cancer. mother Father. deceased age 37 due to complications from alcoholism Mother. deceased 23 due to lung cancer   Social History Number of flights of stairs before winded. 4-5 Marital status. divorced Living situation. live  alone Tobacco use. former smoker; smoke(d) 2 pack(s) per day Tobacco / smoke exposure. no Pain Contract. yes Illicit drug use. no Current work status. disabled Children. 2 Alcohol use. current drinker; drinks wine and hard liquor; only occasionally per week Exercise. Exercises weekly; does running / walking Drug/Alcohol Rehab (Previously). no Drug/Alcohol Rehab (Currently). no Post-Surgical Plans. SNF Advance Directives. healthcare POA: [facility] [phone]   Medication History CeleBREX (200MG  Capsule, Oral) Active. AmLODIPine Besylate (5MG  Tablet, Oral) Active. Methadone HCl (10MG  Tablet, Oral) Active. Omeprazole (40MG  Capsule DR, Oral) Active. Venlafaxine HCl ER (150MG  Capsule ER 24HR, Oral) Active. Losartan Potassium-HCTZ (50-12.5MG  Tablet, Oral daily) Active. Cyclobenzaprine HCl (10MG  Tablet, Oral as needed) Active. Metamucil ( Oral daily) Active. Testim (50MG /5GM Gel, Transdermal as needed) Active. Multiple Vitamins ( Oral) Active.   Past Surgical History Appendectomy Gallbladder Surgery. open Hip Replacement, Total. bilateral Eye Surgery. bilateral Vertical Banded Gasteplasty    Review of Systems General:Not Present- Chills, Fever, Night Sweats, Fatigue, Weight Gain, Weight Loss and Memory Loss. Skin:Present- Dryness. Not Present- Hives, Itching, Rash, Eczema and Lesions. HEENT:Not Present- Tinnitus, Headache, Double Vision, Visual Loss, Hearing Loss and Dentures. Respiratory:Not Present- Shortness of breath with exertion, Shortness of breath at rest, Allergies, Coughing up blood and Chronic Cough. Cardiovascular:Not Present- Chest Pain, Racing/skipping heartbeats, Difficulty Breathing Lying Down, Murmur, Swelling and Palpitations. Gastrointestinal:Not Present- Bloody Stool, Heartburn, Abdominal Pain, Vomiting, Nausea, Constipation, Diarrhea, Difficulty Swallowing, Jaundice and Loss of appetitie. Male Genitourinary:Present- Urinary  Retention. Not Present- Urinary frequency, Blood in Urine, Weak urinary stream, Discharge, Flank Pain, Incontinence, Painful Urination, Urgency and Urinating at Night. Musculoskeletal:Present- Joint Swelling and Joint Pain. Not Present- Muscle Weakness, Muscle Pain, Back Pain, Morning Stiffness and Spasms. Neurological:Not Present- Tremor,  Dizziness, Blackout spells, Paralysis, Difficulty with balance and Weakness. Psychiatric:Not Present- Insomnia.   Vitals Weight: 385 lb Height: 70 in Body Surface Area: 2.94 m Body Mass Index: 55.24 kg/m Pulse: 84 (Regular) Resp.: 18 (Unlabored) BP: 132/78 (Sitting, Left Arm, Standard)    Physical Exam General Mental Status - Alert, cooperative and good historian. General Appearance- pleasant. Not in acute distress. Orientation- Oriented X3. Build & Nutrition- Obese and Well developed. Head and Neck Head- normocephalic, atraumatic . Neck Global Assessment- supple. no bruit auscultated on the right and no bruit auscultated on the left. Eye Pupil- Bilateral- Regular and Round. Motion- Bilateral- EOMI. Chest and Lung Exam Auscultation: Breath sounds:- clear at anterior chest wall and - clear at posterior chest wall. Adventitious sounds:- No Adventitious sounds. Cardiovascular Auscultation:Rhythm- Regular rate and rhythm. Heart Sounds- S1 WNL and S2 WNL. Murmurs & Other Heart Sounds:Auscultation of the heart reveals - No Murmurs Abdomen Inspection:Contour- Obese. Palpation/Percussion:Tenderness- Abdomen is non-tender to palpation. Rigidity (guarding)- Abdomen is soft. Auscultation:Auscultation of the abdomen reveals - Bowel sounds normal. Male GenitourinaryNot done, not pertinent to present illness Peripheral Vascular Upper Extremity: Palpation:- Pulses bilaterally normal. Lower Extremity: Palpation:- Pulses bilaterally normal. Neurologic Examination of related systems reveals - normal  muscle strength and tone in all extremities. Neurologic evaluation reveals - normal sensation and upper and lower extremity deep tendon reflexes intact bilaterally Musculoskeletal He has pain with any flexion, internal or external rotation of the hip.    Radiographs X-rays of his hip are obtained and show him to have a beginning protrusion of his acetabular cup.    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

## 2012-03-02 ENCOUNTER — Encounter (HOSPITAL_COMMUNITY): Payer: Self-pay | Admitting: Pharmacy Technician

## 2012-03-11 ENCOUNTER — Ambulatory Visit (HOSPITAL_COMMUNITY)
Admission: RE | Admit: 2012-03-11 | Discharge: 2012-03-11 | Disposition: A | Discharge status: Home / Self Care | Payer: Medicare Other | Source: Ambulatory Visit | Attending: Orthopedic Surgery | Admitting: Orthopedic Surgery

## 2012-03-11 ENCOUNTER — Encounter (HOSPITAL_COMMUNITY): Payer: Self-pay

## 2012-03-11 ENCOUNTER — Encounter (HOSPITAL_COMMUNITY)
Admission: RE | Admit: 2012-03-11 | Discharge: 2012-03-11 | Disposition: A | Discharge status: Home / Self Care | Payer: Medicare Other | Source: Ambulatory Visit | Attending: Orthopedic Surgery | Admitting: Orthopedic Surgery

## 2012-03-11 DIAGNOSIS — T84039A Mechanical loosening of unspecified internal prosthetic joint, initial encounter: Secondary | ICD-10-CM | POA: Insufficient documentation

## 2012-03-11 DIAGNOSIS — Z96649 Presence of unspecified artificial hip joint: Secondary | ICD-10-CM | POA: Insufficient documentation

## 2012-03-11 DIAGNOSIS — Y838 Other surgical procedures as the cause of abnormal reaction of the patient, or of later complication, without mention of misadventure at the time of the procedure: Secondary | ICD-10-CM | POA: Insufficient documentation

## 2012-03-11 DIAGNOSIS — I10 Essential (primary) hypertension: Secondary | ICD-10-CM | POA: Insufficient documentation

## 2012-03-11 DIAGNOSIS — Z01812 Encounter for preprocedural laboratory examination: Secondary | ICD-10-CM | POA: Insufficient documentation

## 2012-03-11 HISTORY — DX: Insomnia, unspecified: G47.00

## 2012-03-11 HISTORY — DX: Gastro-esophageal reflux disease without esophagitis: K21.9

## 2012-03-11 HISTORY — DX: Sleep apnea, unspecified: G47.30

## 2012-03-11 HISTORY — DX: Angina pectoris, unspecified: I20.9

## 2012-03-11 HISTORY — DX: Unspecified hemorrhoids: K64.9

## 2012-03-11 HISTORY — DX: Essential (primary) hypertension: I10

## 2012-03-11 HISTORY — DX: Personal history of other medical treatment: Z92.89

## 2012-03-11 HISTORY — DX: Unspecified osteoarthritis, unspecified site: M19.90

## 2012-03-11 LAB — CBC
HCT: 37.8 % — ABNORMAL LOW (ref 39.0–52.0)
Hemoglobin: 12.3 g/dL — ABNORMAL LOW (ref 13.0–17.0)
MCH: 30.1 pg (ref 26.0–34.0)
MCHC: 32.5 g/dL (ref 30.0–36.0)
MCV: 92.4 fL (ref 78.0–100.0)
Platelets: 152 10*3/uL (ref 150–400)
RBC: 4.09 MIL/uL — ABNORMAL LOW (ref 4.22–5.81)
RDW: 13.3 % (ref 11.5–15.5)
WBC: 4.8 10*3/uL (ref 4.0–10.5)

## 2012-03-11 LAB — URINALYSIS, ROUTINE W REFLEX MICROSCOPIC
Bilirubin Urine: NEGATIVE
Glucose, UA: NEGATIVE mg/dL
Hgb urine dipstick: NEGATIVE
Ketones, ur: NEGATIVE mg/dL
Leukocytes, UA: NEGATIVE
Nitrite: NEGATIVE
Protein, ur: NEGATIVE mg/dL
Specific Gravity, Urine: 1.013 (ref 1.005–1.030)
Urobilinogen, UA: 0.2 mg/dL (ref 0.0–1.0)
pH: 6.5 (ref 5.0–8.0)

## 2012-03-11 LAB — COMPREHENSIVE METABOLIC PANEL
ALT: 20 U/L (ref 0–53)
AST: 27 U/L (ref 0–37)
Albumin: 3.6 g/dL (ref 3.5–5.2)
Alkaline Phosphatase: 109 U/L (ref 39–117)
BUN: 21 mg/dL (ref 6–23)
CO2: 27 mEq/L (ref 19–32)
Calcium: 9.2 mg/dL (ref 8.4–10.5)
Chloride: 98 mEq/L (ref 96–112)
Creatinine, Ser: 1.14 mg/dL (ref 0.50–1.35)
GFR calc Af Amer: 76 mL/min — ABNORMAL LOW (ref >90)
GFR calc non Af Amer: 66 mL/min — ABNORMAL LOW (ref >90)
Glucose, Bld: 88 mg/dL (ref 70–99)
Potassium: 4.5 mEq/L (ref 3.5–5.1)
Sodium: 134 mEq/L — ABNORMAL LOW (ref 135–145)
Total Bilirubin: 0.3 mg/dL (ref 0.3–1.2)
Total Protein: 7 g/dL (ref 6.0–8.3)

## 2012-03-11 LAB — PROTIME-INR
INR: 1.07 (ref 0.00–1.49)
Prothrombin Time: 14.1 seconds (ref 11.6–15.2)

## 2012-03-11 LAB — APTT: aPTT: 37 seconds (ref 24–37)

## 2012-03-11 LAB — SURGICAL PCR SCREEN
MRSA, PCR: NEGATIVE
Staphylococcus aureus: NEGATIVE

## 2012-03-11 NOTE — Patient Instructions (Signed)
20 [name]  03/11/2012   Your procedure is scheduled on:  03/17/12  Wednesday   Surgery [id number]  Report to [facility] at 0630      AM.  Call this number if you have problems the morning of surgery: [phone]     Or PST   [id number]  [name]   Remember: BRING CPAP MASK WITH YOU TO HOSPITAL  Do not eat food: or drink any fluids After Midnight.  Tuesday NIGHT       Take these medicines the morning of surgery with A SIP OF WATER:  AMLODIPINE, METHADONE, OMEPRAZOLE, EFFEXOR                                      FLEXARIL IF NEEDED   Do not wear jewelry, make-up or nail polish.  Do not wear lotions, powders, or perfumes. You may wear deodorant.  Do not shave 48 hours prior to surgery.  Do not bring valuables to the hospital.  Contacts, dentures or bridgework may not be worn into surgery.  Leave suitcase in the car. After surgery it may be brought to your room.  For patients admitted to the hospital, checkout time is 11:00 AM the day of discharge.   Patients discharged the day of surgery will not be allowed to drive home.  Name and phone number of your driver:         [name]   friend                                                             Special Instructions: CHG Shower Use Special Wash: 1/2 bottle night before surgery and 1/2 bottle morning of surgery. REGULAR SOAP FACE AND PRIVATES                          MEN-MAY SHAVE FACE MORNING OF SURGERY  Please read over the following fact sheets that you were given: MRSA Information

## 2012-03-11 NOTE — Pre-Procedure Instructions (Signed)
Unable to obtain OV note from [facility]-"not in computer"- all studies were from 2006. States had an event monitor in 2006 but was removed and 3 months..States had injury to left small finger and split the nail-  Portion of nail came off today with small amt old bleeding noted- looks clean and dry- instructed if becomes red or infected to have this looked at by PCP.  Records from Dr Salina April office- H&P and clearance noted requested from Parke Poisson at [facility].  Notified Nikki, portable equipment of need for bariatric bed day of surgery

## 2012-03-15 NOTE — Pre-Procedure Instructions (Signed)
Per w caton at [facility] 03/12/12- she will obtain clearance from Dr Deitra Mayo office on Monday. Patient told this RN at PST appt,  and W Theda Belfast that had been completed

## 2012-03-17 ENCOUNTER — Ambulatory Visit (HOSPITAL_COMMUNITY): Payer: Medicare Other

## 2012-03-17 ENCOUNTER — Ambulatory Visit (HOSPITAL_COMMUNITY): Payer: Medicare Other | Admitting: Anesthesiology

## 2012-03-17 ENCOUNTER — Encounter (HOSPITAL_COMMUNITY)
Admission: RE | Disposition: A | Discharge status: Skilled Nursing Facility | Payer: Self-pay | Source: Ambulatory Visit | Attending: Orthopedic Surgery

## 2012-03-17 ENCOUNTER — Encounter (HOSPITAL_COMMUNITY): Payer: Self-pay

## 2012-03-17 ENCOUNTER — Encounter (HOSPITAL_COMMUNITY): Payer: Self-pay | Admitting: Anesthesiology

## 2012-03-17 ENCOUNTER — Inpatient Hospital Stay (HOSPITAL_COMMUNITY)
Admission: RE | Admit: 2012-03-17 | Discharge: 2012-03-20 | DRG: 467 | Disposition: A | Discharge status: Skilled Nursing Facility | Payer: Medicare Other | Source: Ambulatory Visit | Attending: Orthopedic Surgery | Admitting: Orthopedic Surgery

## 2012-03-17 DIAGNOSIS — Z96649 Presence of unspecified artificial hip joint: Secondary | ICD-10-CM

## 2012-03-17 DIAGNOSIS — T84098A Other mechanical complication of other internal joint prosthesis, initial encounter: Secondary | ICD-10-CM

## 2012-03-17 DIAGNOSIS — G8918 Other acute postprocedural pain: Secondary | ICD-10-CM | POA: Diagnosis present

## 2012-03-17 DIAGNOSIS — Z6841 Body Mass Index (BMI) 40.0 and over, adult: Secondary | ICD-10-CM

## 2012-03-17 DIAGNOSIS — I10 Essential (primary) hypertension: Secondary | ICD-10-CM | POA: Diagnosis present

## 2012-03-17 DIAGNOSIS — M1610 Unilateral primary osteoarthritis, unspecified hip: Secondary | ICD-10-CM | POA: Diagnosis present

## 2012-03-17 DIAGNOSIS — E871 Hypo-osmolality and hyponatremia: Secondary | ICD-10-CM

## 2012-03-17 DIAGNOSIS — F3289 Other specified depressive episodes: Secondary | ICD-10-CM | POA: Diagnosis present

## 2012-03-17 DIAGNOSIS — F329 Major depressive disorder, single episode, unspecified: Secondary | ICD-10-CM | POA: Diagnosis present

## 2012-03-17 DIAGNOSIS — T84039A Mechanical loosening of unspecified internal prosthetic joint, initial encounter: Principal | ICD-10-CM | POA: Diagnosis present

## 2012-03-17 DIAGNOSIS — M169 Osteoarthritis of hip, unspecified: Secondary | ICD-10-CM | POA: Diagnosis present

## 2012-03-17 DIAGNOSIS — Y831 Surgical operation with implant of artificial internal device as the cause of abnormal reaction of the patient, or of later complication, without mention of misadventure at the time of the procedure: Secondary | ICD-10-CM | POA: Diagnosis present

## 2012-03-17 LAB — TYPE AND SCREEN
ABO/RH(D): A POS
Antibody Screen: NEGATIVE

## 2012-03-17 SURGERY — REVISION, TOTAL ARTHROPLASTY, HIP, ACETABULAR COMPONENT
Anesthesia: General | Site: Hip | Laterality: Right | Wound class: Clean

## 2012-03-17 MED ORDER — ACETAMINOPHEN 10 MG/ML IV SOLN: 1000.0000 mg | Freq: Once | INTRAVENOUS | Status: DC

## 2012-03-17 MED ORDER — ACETAMINOPHEN 10 MG/ML IV SOLN
INTRAVENOUS | Status: AC
  Filled 2012-03-17: qty 100

## 2012-03-17 MED ORDER — VENLAFAXINE HCL ER 150 MG PO CP24
300.0000 mg | ORAL_CAPSULE | Freq: Every day | ORAL | Status: DC
  Administered 2012-03-18 – 2012-03-20 (×3): 300 mg via ORAL
  Filled 2012-03-17 (×4): qty 2

## 2012-03-17 MED ORDER — OXYCODONE HCL 5 MG PO TABS
ORAL_TABLET | ORAL | Status: AC
  Administered 2012-03-17: 15 mg
  Filled 2012-03-17: qty 3

## 2012-03-17 MED ORDER — EPHEDRINE SULFATE 50 MG/ML IJ SOLN
INTRAMUSCULAR | Status: DC | PRN
  Administered 2012-03-17: 5 mg via INTRAVENOUS

## 2012-03-17 MED ORDER — AMLODIPINE BESYLATE 5 MG PO TABS
5.0000 mg | ORAL_TABLET | Freq: Every day | ORAL | Status: DC
  Administered 2012-03-18: 5 mg via ORAL
  Filled 2012-03-17 (×2): qty 1

## 2012-03-17 MED ORDER — POLYETHYLENE GLYCOL 3350 17 G PO PACK: 17.0000 g | PACK | Freq: Every day | ORAL | Status: DC | PRN

## 2012-03-17 MED ORDER — ONDANSETRON HCL 4 MG/2ML IJ SOLN
INTRAMUSCULAR | Status: DC | PRN
  Administered 2012-03-17 (×2): 2 mg via INTRAVENOUS

## 2012-03-17 MED ORDER — 0.9 % SODIUM CHLORIDE (POUR BTL) OPTIME
TOPICAL | Status: DC | PRN
  Administered 2012-03-17: 1000 mL

## 2012-03-17 MED ORDER — HYDROMORPHONE HCL PF 1 MG/ML IJ SOLN
INTRAMUSCULAR | Status: AC
  Filled 2012-03-17: qty 1

## 2012-03-17 MED ORDER — HYDROMORPHONE HCL PF 1 MG/ML IJ SOLN
INTRAMUSCULAR | Status: DC | PRN
  Administered 2012-03-17 (×2): 1 mg via INTRAVENOUS

## 2012-03-17 MED ORDER — ACETAMINOPHEN 325 MG PO TABS
650.0000 mg | ORAL_TABLET | Freq: Four times a day (QID) | ORAL | Status: DC | PRN
  Administered 2012-03-18 – 2012-03-19 (×2): 650 mg via ORAL
  Filled 2012-03-17 (×2): qty 2

## 2012-03-17 MED ORDER — METOCLOPRAMIDE HCL 10 MG PO TABS: 5.0000 mg | ORAL_TABLET | Freq: Three times a day (TID) | ORAL | Status: DC | PRN

## 2012-03-17 MED ORDER — ACETAMINOPHEN 10 MG/ML IV SOLN
1000.0000 mg | Freq: Four times a day (QID) | INTRAVENOUS | Status: AC
  Administered 2012-03-17 – 2012-03-18 (×4): 1000 mg via INTRAVENOUS
  Filled 2012-03-17 (×4): qty 100

## 2012-03-17 MED ORDER — ONDANSETRON HCL 4 MG PO TABS
4.0000 mg | ORAL_TABLET | Freq: Four times a day (QID) | ORAL | Status: DC | PRN
  Administered 2012-03-18: 4 mg via ORAL
  Filled 2012-03-17: qty 1

## 2012-03-17 MED ORDER — HYDROMORPHONE HCL PF 1 MG/ML IJ SOLN
0.2500 mg | INTRAMUSCULAR | Status: DC | PRN
  Administered 2012-03-17 (×7): 0.5 mg via INTRAVENOUS

## 2012-03-17 MED ORDER — HYDROCHLOROTHIAZIDE 12.5 MG PO CAPS
12.5000 mg | ORAL_CAPSULE | Freq: Every day | ORAL | Status: DC
  Administered 2012-03-18 – 2012-03-20 (×3): 12.5 mg via ORAL
  Filled 2012-03-17 (×4): qty 1

## 2012-03-17 MED ORDER — NEOSTIGMINE METHYLSULFATE 1 MG/ML IJ SOLN
INTRAMUSCULAR | Status: DC | PRN
  Administered 2012-03-17: 5 mg via INTRAVENOUS

## 2012-03-17 MED ORDER — MIDAZOLAM HCL 5 MG/5ML IJ SOLN
INTRAMUSCULAR | Status: DC | PRN
  Administered 2012-03-17 (×2): 1 mg via INTRAVENOUS

## 2012-03-17 MED ORDER — CEFAZOLIN SODIUM 1-5 GM-% IV SOLN
INTRAVENOUS | Status: DC | PRN
  Administered 2012-03-17: 3 g via INTRAVENOUS

## 2012-03-17 MED ORDER — LACTATED RINGERS IV SOLN: INTRAVENOUS | Status: DC

## 2012-03-17 MED ORDER — CISATRACURIUM BESYLATE (PF) 10 MG/5ML IV SOLN
INTRAVENOUS | Status: DC | PRN
  Administered 2012-03-17: 4 mg via INTRAVENOUS
  Administered 2012-03-17: 10 mg via INTRAVENOUS
  Administered 2012-03-17: 2 mg via INTRAVENOUS

## 2012-03-17 MED ORDER — LIDOCAINE HCL (CARDIAC) 20 MG/ML IV SOLN
INTRAVENOUS | Status: DC | PRN
  Administered 2012-03-17: 100 mg via INTRAVENOUS

## 2012-03-17 MED ORDER — DOCUSATE SODIUM 100 MG PO CAPS
100.0000 mg | ORAL_CAPSULE | Freq: Two times a day (BID) | ORAL | Status: DC
  Administered 2012-03-17 – 2012-03-20 (×6): 100 mg via ORAL

## 2012-03-17 MED ORDER — DEXTROSE 5 % IV SOLN
3.0000 g | INTRAVENOUS | Status: DC
  Filled 2012-03-17: qty 30

## 2012-03-17 MED ORDER — DIPHENHYDRAMINE HCL 12.5 MG/5ML PO ELIX: 12.5000 mg | ORAL_SOLUTION | ORAL | Status: DC | PRN

## 2012-03-17 MED ORDER — ONDANSETRON HCL 4 MG/2ML IJ SOLN
4.0000 mg | Freq: Four times a day (QID) | INTRAMUSCULAR | Status: DC | PRN
  Administered 2012-03-17: 4 mg via INTRAVENOUS
  Filled 2012-03-17: qty 2

## 2012-03-17 MED ORDER — BISACODYL 10 MG RE SUPP: 10.0000 mg | Freq: Every day | RECTAL | Status: DC | PRN

## 2012-03-17 MED ORDER — SODIUM CHLORIDE 0.9 % IV SOLN: INTRAVENOUS | Status: DC

## 2012-03-17 MED ORDER — ACETAMINOPHEN 10 MG/ML IV SOLN
INTRAVENOUS | Status: DC | PRN
  Administered 2012-03-17: 1000 mg via INTRAVENOUS

## 2012-03-17 MED ORDER — METHOCARBAMOL 500 MG PO TABS
500.0000 mg | ORAL_TABLET | Freq: Four times a day (QID) | ORAL | Status: DC | PRN
  Administered 2012-03-17 – 2012-03-19 (×5): 500 mg via ORAL
  Filled 2012-03-17 (×6): qty 1

## 2012-03-17 MED ORDER — FLEET ENEMA 7-19 GM/118ML RE ENEM: 1.0000 | ENEMA | Freq: Once | RECTAL | Status: AC | PRN

## 2012-03-17 MED ORDER — SODIUM CHLORIDE 0.9 % IV SOLN
INTRAVENOUS | Status: DC
  Administered 2012-03-17: 1000 mL via INTRAVENOUS
  Administered 2012-03-18 (×2): via INTRAVENOUS

## 2012-03-17 MED ORDER — PHENOL 1.4 % MT LIQD
1.0000 | OROMUCOSAL | Status: DC | PRN
  Filled 2012-03-17: qty 177

## 2012-03-17 MED ORDER — PROMETHAZINE HCL 25 MG/ML IJ SOLN: 6.2500 mg | INTRAMUSCULAR | Status: DC | PRN

## 2012-03-17 MED ORDER — LACTATED RINGERS IV SOLN
INTRAVENOUS | Status: DC | PRN
  Administered 2012-03-17 (×3): via INTRAVENOUS

## 2012-03-17 MED ORDER — CEFAZOLIN SODIUM 1-5 GM-% IV SOLN
INTRAVENOUS | Status: AC
  Filled 2012-03-17: qty 50

## 2012-03-17 MED ORDER — METOCLOPRAMIDE HCL 5 MG/ML IJ SOLN: 5.0000 mg | Freq: Three times a day (TID) | INTRAMUSCULAR | Status: DC | PRN

## 2012-03-17 MED ORDER — CEFAZOLIN SODIUM-DEXTROSE 2-3 GM-% IV SOLR
2.0000 g | Freq: Four times a day (QID) | INTRAVENOUS | Status: AC
  Administered 2012-03-17 (×2): 2 g via INTRAVENOUS
  Filled 2012-03-17 (×2): qty 50

## 2012-03-17 MED ORDER — GLYCOPYRROLATE 0.2 MG/ML IJ SOLN
INTRAMUSCULAR | Status: DC | PRN
  Administered 2012-03-17: 0.6 mg via INTRAVENOUS

## 2012-03-17 MED ORDER — POLYVINYL ALCOHOL 1.4 % OP SOLN
1.0000 [drp] | Freq: Three times a day (TID) | OPHTHALMIC | Status: DC | PRN
  Filled 2012-03-17: qty 15

## 2012-03-17 MED ORDER — TESTOSTERONE 50 MG/5GM (1%) TD GEL
10.0000 g | Freq: Every day | TRANSDERMAL | Status: DC
  Administered 2012-03-17 – 2012-03-19 (×3): 10 g via TRANSDERMAL
  Filled 2012-03-17 (×4): qty 5

## 2012-03-17 MED ORDER — MORPHINE SULFATE 2 MG/ML IJ SOLN
1.0000 mg | INTRAMUSCULAR | Status: DC | PRN
  Administered 2012-03-17 – 2012-03-18 (×6): 2 mg via INTRAVENOUS
  Filled 2012-03-17: qty 2
  Filled 2012-03-17 (×4): qty 1
  Filled 2012-03-17: qty 2
  Filled 2012-03-17: qty 1

## 2012-03-17 MED ORDER — METHADONE HCL 10 MG PO TABS
20.0000 mg | ORAL_TABLET | Freq: Four times a day (QID) | ORAL | Status: DC
  Administered 2012-03-17 – 2012-03-20 (×12): 20 mg via ORAL
  Filled 2012-03-17 (×12): qty 2

## 2012-03-17 MED ORDER — MENTHOL 3 MG MT LOZG
1.0000 | LOZENGE | OROMUCOSAL | Status: DC | PRN
  Administered 2012-03-17: 3 mg via ORAL
  Filled 2012-03-17 (×2): qty 9

## 2012-03-17 MED ORDER — HYDROMORPHONE HCL PF 1 MG/ML IJ SOLN: 0.2500 mg | INTRAMUSCULAR | Status: DC | PRN

## 2012-03-17 MED ORDER — CEFAZOLIN SODIUM-DEXTROSE 2-3 GM-% IV SOLR
INTRAVENOUS | Status: AC
  Filled 2012-03-17: qty 50

## 2012-03-17 MED ORDER — METHOCARBAMOL 100 MG/ML IJ SOLN
500.0000 mg | Freq: Four times a day (QID) | INTRAVENOUS | Status: DC | PRN
  Administered 2012-03-17: 500 mg via INTRAVENOUS
  Filled 2012-03-17 (×2): qty 5

## 2012-03-17 MED ORDER — PANTOPRAZOLE SODIUM 40 MG PO TBEC
80.0000 mg | DELAYED_RELEASE_TABLET | Freq: Every day | ORAL | Status: DC
  Administered 2012-03-17: 80 mg via ORAL
  Filled 2012-03-17 (×2): qty 2

## 2012-03-17 MED ORDER — FENTANYL CITRATE 0.05 MG/ML IJ SOLN
INTRAMUSCULAR | Status: DC | PRN
  Administered 2012-03-17: 150 ug via INTRAVENOUS
  Administered 2012-03-17: 100 ug via INTRAVENOUS
  Administered 2012-03-17: 150 ug via INTRAVENOUS
  Administered 2012-03-17 (×3): 100 ug via INTRAVENOUS
  Administered 2012-03-17: 50 ug via INTRAVENOUS

## 2012-03-17 MED ORDER — RIVAROXABAN 10 MG PO TABS
10.0000 mg | ORAL_TABLET | Freq: Every day | ORAL | Status: DC
  Administered 2012-03-18 – 2012-03-20 (×3): 10 mg via ORAL
  Filled 2012-03-17 (×4): qty 1

## 2012-03-17 MED ORDER — LOSARTAN POTASSIUM-HCTZ 50-12.5 MG PO TABS: 1.0000 | ORAL_TABLET | Freq: Every day | ORAL | Status: DC

## 2012-03-17 MED ORDER — MENTHOL 3 MG MT LOZG: 1.0000 | LOZENGE | OROMUCOSAL | Status: DC | PRN

## 2012-03-17 MED ORDER — PROPOFOL 10 MG/ML IV EMUL
INTRAVENOUS | Status: DC | PRN
  Administered 2012-03-17: 50 mg via INTRAVENOUS
  Administered 2012-03-17: 200 mg via INTRAVENOUS

## 2012-03-17 MED ORDER — OXYCODONE HCL 5 MG PO TABS
5.0000 mg | ORAL_TABLET | ORAL | Status: DC | PRN
  Administered 2012-03-17 (×2): 20 mg via ORAL
  Administered 2012-03-17: 15 mg via ORAL
  Administered 2012-03-18 – 2012-03-19 (×8): 20 mg via ORAL
  Filled 2012-03-17 (×11): qty 4

## 2012-03-17 MED ORDER — LOSARTAN POTASSIUM 50 MG PO TABS
50.0000 mg | ORAL_TABLET | Freq: Every day | ORAL | Status: DC
  Administered 2012-03-18 – 2012-03-20 (×3): 50 mg via ORAL
  Filled 2012-03-17 (×4): qty 1

## 2012-03-17 MED ORDER — ACETAMINOPHEN 650 MG RE SUPP: 650.0000 mg | Freq: Four times a day (QID) | RECTAL | Status: DC | PRN

## 2012-03-17 MED ORDER — PSYLLIUM 95 % PO PACK
1.0000 | PACK | Freq: Three times a day (TID) | ORAL | Status: DC
  Administered 2012-03-17 – 2012-03-20 (×8): 1 via ORAL
  Filled 2012-03-17 (×11): qty 1

## 2012-03-17 MED ORDER — SUCCINYLCHOLINE CHLORIDE 20 MG/ML IJ SOLN
INTRAMUSCULAR | Status: DC | PRN
  Administered 2012-03-17: 120 mg via INTRAVENOUS

## 2012-03-17 SURGICAL SUPPLY — 64 items
BAG ZIPLOCK 12X15 (MISCELLANEOUS) ×6 IMPLANT
BIT DRILL 2.8X128 (BIT) ×2 IMPLANT
BLADE EXTENDED COATED 6.5IN (ELECTRODE) ×2 IMPLANT
BLADE SAW SAG 73X25 THK (BLADE) ×1
BLADE SAW SGTL 73X25 THK (BLADE) ×1 IMPLANT
CATH KIT ON-Q SILVERSOAK 5IN (CATHETERS) ×2 IMPLANT
CLOTH BEACON ORANGE TIMEOUT ST (SAFETY) ×2 IMPLANT
CONT SPECI 4OZ STER CLIK (MISCELLANEOUS) IMPLANT
CUP PINNACLE REV 60MM (Orthopedic Implant) ×2 IMPLANT
DRAPE INCISE IOBAN 66X45 STRL (DRAPES) ×2 IMPLANT
DRAPE ORTHO SPLIT 77X108 STRL (DRAPES) ×2
DRAPE POUCH INSTRU U-SHP 10X18 (DRAPES) ×2 IMPLANT
DRAPE SURG ORHT 6 SPLT 77X108 (DRAPES) ×2 IMPLANT
DRAPE U-SHAPE 47X51 STRL (DRAPES) ×2 IMPLANT
DRSG EMULSION OIL 3X16 NADH (GAUZE/BANDAGES/DRESSINGS) ×2 IMPLANT
DRSG MEPILEX BORDER 4X12 (GAUZE/BANDAGES/DRESSINGS) ×2 IMPLANT
DRSG MEPILEX BORDER 4X4 (GAUZE/BANDAGES/DRESSINGS) ×4 IMPLANT
DRSG MEPILEX BORDER 4X8 (GAUZE/BANDAGES/DRESSINGS) ×2 IMPLANT
DURAPREP 26ML APPLICATOR (WOUND CARE) ×2 IMPLANT
ELECT REM PT RETURN 9FT ADLT (ELECTROSURGICAL) ×2
ELECTRODE REM PT RTRN 9FT ADLT (ELECTROSURGICAL) ×1 IMPLANT
EVACUATOR 1/8 PVC DRAIN (DRAIN) ×2 IMPLANT
FACESHIELD LNG OPTICON STERILE (SAFETY) ×8 IMPLANT
GLOVE BIO SURGEON STRL SZ7.5 (GLOVE) ×2 IMPLANT
GLOVE BIO SURGEON STRL SZ8 (GLOVE) ×2 IMPLANT
GLOVE BIOGEL PI IND STRL 8 (GLOVE) ×2 IMPLANT
GLOVE BIOGEL PI INDICATOR 8 (GLOVE) ×2
GOWN STRL NON-REIN LRG LVL3 (GOWN DISPOSABLE) ×2 IMPLANT
GOWN STRL REIN XL XLG (GOWN DISPOSABLE) ×2 IMPLANT
HEAD CERAMIC BIOLOX DELTA 36 6 (Hips) ×2 IMPLANT
IMMOBILIZER KNEE 20 (SOFTGOODS) ×2
IMMOBILIZER KNEE 20 THIGH 36 (SOFTGOODS) ×1 IMPLANT
KIT BASIN OR (CUSTOM PROCEDURE TRAY) ×2 IMPLANT
LINER MARATHON NEUT +4X54X36 (Hips) ×2 IMPLANT
MANIFOLD NEPTUNE II (INSTRUMENTS) ×2 IMPLANT
NDL SAFETY ECLIPSE 18X1.5 (NEEDLE) IMPLANT
NEEDLE HYPO 18GX1.5 SHARP (NEEDLE)
NS IRRIG 1000ML POUR BTL (IV SOLUTION) ×2 IMPLANT
PACK TOTAL JOINT (CUSTOM PROCEDURE TRAY) ×2 IMPLANT
PASSER SUT SWANSON 36MM LOOP (INSTRUMENTS) ×2 IMPLANT
POSITIONER SURGICAL ARM (MISCELLANEOUS) ×2 IMPLANT
SCREW 6.5MMX30MM (Screw) ×2 IMPLANT
SCREW PERIPHERAL BONE SZ 5M (Screw) ×2 IMPLANT
SCREW PINN CAN BONE 6.5MMX15MM (Screw) ×2 IMPLANT
SCREW TPR HD 5.0MM DIA 50 (Screw) ×2 IMPLANT
SPONGE GAUZE 4X4 12PLY (GAUZE/BANDAGES/DRESSINGS) ×2 IMPLANT
SPONGE LAP 18X18 X RAY DECT (DISPOSABLE) ×2 IMPLANT
STAPLER VISISTAT 35W (STAPLE) ×2 IMPLANT
STEM FEM SROM STD 17X22 36+12 (Hips) ×2 IMPLANT
SUCTION FRAZIER TIP 10 FR DISP (SUCTIONS) ×2 IMPLANT
SUT ETHIBOND NAB CT1 #1 30IN (SUTURE) ×4 IMPLANT
SUT MNCRL AB 4-0 PS2 18 (SUTURE) ×2 IMPLANT
SUT VIC AB 1 CT1 27 (SUTURE) ×3
SUT VIC AB 1 CT1 27XBRD ANTBC (SUTURE) ×3 IMPLANT
SUT VIC AB 2-0 CT1 27 (SUTURE) ×4
SUT VIC AB 2-0 CT1 TAPERPNT 27 (SUTURE) ×4 IMPLANT
SUT VLOC 180 0 24IN GS25 (SUTURE) ×4 IMPLANT
SUT WIRE 16GA (Orthopedic Implant) ×4 IMPLANT
SWAB COLLECTION DEVICE MRSA (MISCELLANEOUS) ×2 IMPLANT
SYR 50ML LL SCALE MARK (SYRINGE) IMPLANT
TOWEL OR 17X26 10 PK STRL BLUE (TOWEL DISPOSABLE) ×4 IMPLANT
TRAY FOLEY CATH 14FRSI W/METER (CATHETERS) ×2 IMPLANT
TUBE ANAEROBIC SPECIMEN COL (MISCELLANEOUS) IMPLANT
WATER STERILE IRR 1500ML POUR (IV SOLUTION) ×2 IMPLANT

## 2012-03-17 NOTE — Anesthesia Procedure Notes (Signed)
Procedure Name: Intubation Date/Time: 03/17/2012 8:32 AM Performed by: Edison Pace Pre-anesthesia Checklist: Patient identified, Timeout performed, Emergency Drugs available, Suction available and Patient being monitored Patient Re-evaluated:Patient Re-evaluated prior to inductionOxygen Delivery Method: Circle system utilized Preoxygenation: Pre-oxygenation with 100% oxygen Intubation Type: IV induction and Cricoid Pressure applied Ventilation: Mask ventilation without difficulty Laryngoscope Size: Mac and 4 Grade View: Grade II Tube type: Oral Tube size: 7.5 mm Number of attempts: 1 Airway Equipment and Method: Stylet Placement Confirmation: ETT inserted through vocal cords under direct vision,  positive ETCO2 and breath sounds checked- equal and bilateral Secured at: 21 cm Tube secured with: Tape Dental Injury: Teeth and Oropharynx as per pre-operative assessment  Future Recommendations: Recommend- induction with short-acting agent, and alternative techniques readily available

## 2012-03-17 NOTE — Progress Notes (Signed)
Utilization review completed.  

## 2012-03-17 NOTE — Preoperative (Signed)
Beta Blockers   Reason not to administer Beta Blockers:Not Applicable 

## 2012-03-17 NOTE — Progress Notes (Signed)
Placed pt on nasal cpap for rest with 2l o2 bleedin and nasal mask from home. Settings unknown, placed on autotitration mode 7cm-20cm h2o per pt comfort.  Hr 83, sats 100%.  Pt is tolerating cpap well at this time.  RN aware.

## 2012-03-17 NOTE — Anesthesia Preprocedure Evaluation (Addendum)
Anesthesia Evaluation  Patient identified by MRN, date of birth, ID band Patient awake    Reviewed: Allergy & Precautions, H&P , NPO status , Patient's Chart, lab work & pertinent test results  Airway Mallampati: III TM Distance: >3 FB Neck ROM: Full    Dental No notable dental hx.    Pulmonary sleep apnea and Continuous Positive Airway Pressure Ventilation , former smoker,  breath sounds clear to auscultation  Pulmonary exam normal       Cardiovascular hypertension, Pt. on medications + angina negative cardio ROS  Rhythm:Regular Rate:Normal     Neuro/Psych PSYCHIATRIC DISORDERS Depression negative neurological ROS     GI/Hepatic Neg liver ROS, GERD-  Medicated,  Endo/Other  Morbid obesity  Renal/GU negative Renal ROS  negative genitourinary   Musculoskeletal negative musculoskeletal ROS (+)   Abdominal (+) + obese,   Peds negative pediatric ROS (+)  Hematology negative hematology ROS (+)   Anesthesia Other Findings   Reproductive/Obstetrics negative OB ROS                          Anesthesia Physical Anesthesia Plan  ASA: III  Anesthesia Plan: General   Post-op Pain Management:    Induction: Intravenous  Airway Management Planned: Oral ETT  Additional Equipment:   Intra-op Plan:   Post-operative Plan: Extubation in OR  Informed Consent: I have reviewed the patients History and Physical, chart, labs and discussed the procedure including the risks, benefits and alternatives for the proposed anesthesia with the patient or authorized representative who has indicated his/her understanding and acceptance.   Dental advisory given  Plan Discussed with: CRNA  Anesthesia Plan Comments:         Anesthesia Quick Evaluation

## 2012-03-17 NOTE — Brief Op Note (Signed)
03/17/2012  10:55 AM  PATIENT:  [name]  66 y.o. male  PRE-OPERATIVE DIAGNOSIS:  loose implant/ failed implant right Hip  POST-OPERATIVE DIAGNOSIS:  loose implant/ failed implant right Hip  PROCEDURE:  Procedure(s) (LRB): RIGHT THA REVISION  SURGEON:  Surgeon(s) and Role:    * Loanne Drilling, MD - Primary  PHYSICIAN ASSISTANT:   ASSISTANTS: Avel Peace, PA-C   ANESTHESIA:   general  EBL:  Total I/O In: 2000 [I.V.:2000] Out: 975 [Urine:300; Blood:675]  BLOOD ADMINISTERED:none  DRAINS: (Medium) Hemovact drain(s) in the right hip with  Suction Open   LOCAL MEDICATIONS USED:  NONE  COUNTS:  YES  TOURNIQUET:  * No tourniquets in log *  DICTATION: .Other Dictation: Dictation Number [id number]  PLAN OF CARE: Admit to inpatient   PATIENT DISPOSITION:  PACU - hemodynamically stable.   Gus Rankin Fay Swider, MD    03/17/2012, 11:04 AM

## 2012-03-17 NOTE — Anesthesia Postprocedure Evaluation (Signed)
  Anesthesia Post-op Note  Patient: [name]  Procedure(s) Performed: Procedure(s) (LRB): ACETABULAR REVISION (Right)  Patient Location: PACU  Anesthesia Type: General  Level of Consciousness: awake and alert   Airway and Oxygen Therapy: Patient Spontanous Breathing  Post-op Pain: mild  Post-op Assessment: Post-op Vital signs reviewed, Patient's Cardiovascular Status Stable, Respiratory Function Stable, Patent Airway and No signs of Nausea or vomiting  Post-op Vital Signs: stable  Complications: No apparent anesthesia complications

## 2012-03-17 NOTE — Transfer of Care (Signed)
Immediate Anesthesia Transfer of Care Note  Patient: [name]  Procedure(s) Performed: Procedure(s) (LRB): ACETABULAR REVISION (Right)  Patient Location: PACU  Anesthesia Type: General  Level of Consciousness: awake, alert , oriented and patient cooperative  Airway & Oxygen Therapy: Patient Spontanous Breathing and Patient connected to face mask oxygen  Post-op Assessment: Report given to PACU RN, Post -op Vital signs reviewed and stable and Patient moving all extremities  Post vital signs: Reviewed and stable  Complications: No apparent anesthesia complications

## 2012-03-17 NOTE — Interval H&P Note (Signed)
History and Physical Interval Note:  03/17/2012 8:14 AM  [name]  has presented today for surgery, with the diagnosis of right hip acetabular loosing   The various methods of treatment have been discussed with the patient and family. After consideration of risks, benefits and other options for treatment, the patient has consented to  Procedure(s) (LRB): ACETABULAR REVISION (Right) as a surgical intervention .  The patient's history has been reviewed, patient examined, no change in status, stable for surgery.  I have reviewed the patients' chart and labs.  Questions were answered to the patient's satisfaction.     Loanne Drilling

## 2012-03-17 NOTE — Progress Notes (Signed)
Plan prior to admission is for skilled facility. Referral made to social worker.

## 2012-03-18 DIAGNOSIS — E871 Hypo-osmolality and hyponatremia: Secondary | ICD-10-CM

## 2012-03-18 LAB — CBC
HCT: 33.8 % — ABNORMAL LOW (ref 39.0–52.0)
Hemoglobin: 11.1 g/dL — ABNORMAL LOW (ref 13.0–17.0)
MCH: 30.1 pg (ref 26.0–34.0)
MCHC: 32.8 g/dL (ref 30.0–36.0)
MCV: 91.6 fL (ref 78.0–100.0)
Platelets: 120 10*3/uL — ABNORMAL LOW (ref 150–400)
RBC: 3.69 MIL/uL — ABNORMAL LOW (ref 4.22–5.81)
RDW: 13.7 % (ref 11.5–15.5)
WBC: 6.8 10*3/uL (ref 4.0–10.5)

## 2012-03-18 LAB — BASIC METABOLIC PANEL
BUN: 15 mg/dL (ref 6–23)
CO2: 26 mEq/L (ref 19–32)
Calcium: 8.5 mg/dL (ref 8.4–10.5)
Chloride: 101 mEq/L (ref 96–112)
Creatinine, Ser: 1.03 mg/dL (ref 0.50–1.35)
GFR calc Af Amer: 86 mL/min — ABNORMAL LOW (ref >90)
GFR calc non Af Amer: 74 mL/min — ABNORMAL LOW (ref >90)
Glucose, Bld: 115 mg/dL — ABNORMAL HIGH (ref 70–99)
Potassium: 3.9 mEq/L (ref 3.5–5.1)
Sodium: 134 mEq/L — ABNORMAL LOW (ref 135–145)

## 2012-03-18 MED ORDER — CALCIUM CARBONATE ANTACID 500 MG PO CHEW
1.0000 | CHEWABLE_TABLET | Freq: Four times a day (QID) | ORAL | Status: DC | PRN
  Filled 2012-03-18: qty 1

## 2012-03-18 MED ORDER — NON FORMULARY: 80.0000 mg | Freq: Every day | Status: DC

## 2012-03-18 MED ORDER — OMEPRAZOLE 20 MG PO CPDR
40.0000 mg | DELAYED_RELEASE_CAPSULE | Freq: Every day | ORAL | Status: DC
  Administered 2012-03-18 – 2012-03-20 (×3): 40 mg via ORAL
  Filled 2012-03-18 (×3): qty 2

## 2012-03-18 MED ORDER — AMLODIPINE BESYLATE 5 MG PO TABS
5.0000 mg | ORAL_TABLET | Freq: Every day | ORAL | Status: DC
  Administered 2012-03-19 – 2012-03-20 (×2): 5 mg via ORAL
  Filled 2012-03-18 (×2): qty 1

## 2012-03-18 MED ORDER — ALUM & MAG HYDROXIDE-SIMETH 200-200-20 MG/5ML PO SUSP
30.0000 mL | Freq: Four times a day (QID) | ORAL | Status: DC | PRN
  Administered 2012-03-18 – 2012-03-20 (×3): 30 mL via ORAL
  Filled 2012-03-18 (×3): qty 30

## 2012-03-18 NOTE — Progress Notes (Signed)
Clinical Social Work Department CLINICAL SOCIAL WORK PLACEMENT NOTE 03/18/2012  Patient:  DAMYON, MULLANE  Account Number:  [id number] Admit date:  03/17/2012  Clinical Social Worker:  Dellie Burns, Theresia Majors  Date/time:  03/18/2012 03:00 PM  Clinical Social Work is seeking post-discharge placement for this patient at the following level of care:   SKILLED NURSING   (*CSW will update this form in Epic as items are completed)   03/18/2012  Patient/family provided with [facility] Department of Clinical Social Work's list of facilities offering this level of care within the geographic area requested by the patient (or if unable, by the patient's family).  03/18/2012  Patient/family informed of their freedom to choose among providers that offer the needed level of care, that participate in Medicare, Medicaid or managed care program needed by the patient, have an available bed and are willing to accept the patient.  03/18/2012  Patient/family informed of [facility]' ownership interest in [facility], as well as of the fact that they are under no obligation to receive care at this facility.  PASARR submitted to EDS on 03/18/2012 PASARR number received from EDS on 03/18/2012  FL2 transmitted to all facilities in geographic area requested by pt/family on  03/18/2012 FL2 transmitted to all facilities within larger geographic area on   Patient informed that his/her managed care company has contracts with or will negotiate with  certain facilities, including the following:     Patient/family informed of bed offers received:   Patient chooses bed at  Physician recommends and patient chooses bed at    Patient to be transferred to  on   Patient to be transferred to facility by   The following physician request were entered in Epic:   Additional Comments:  Dellie Burns, MSW, LCSWA [phone] (coverage)

## 2012-03-18 NOTE — Progress Notes (Deleted)
CSW confirmed with pt's dtr [facility] is SNF choice. Dtr to complete ppw at [facility] today. Anticipate d/c tomorrow.  Dellie Burns, MSW, [place] [phone] (coverage)

## 2012-03-18 NOTE — Op Note (Signed)
NAMEASHLIN, HIDALGO NO.:  [id number]  MEDICAL RECORD NO.:  [id number]  LOCATION:  1610                         FACILITY:  [facility]  PHYSICIAN:  Ollen Gross, M.D.    DATE OF BIRTH:  26-Dec-1945  DATE OF PROCEDURE:  03/17/2012 DATE OF DISCHARGE:                              OPERATIVE REPORT   PREOPERATIVE DIAGNOSIS:  Failed right total hip arthroplasty.  POSTOPERATIVE DIAGNOSIS:  Failed right total hip arthroplasty.  PROCEDURE:  Right total hip arthroplasty revision.  SURGEON:  Ollen Gross, M.D.  ASSISTANT:  Alexzandrew L. Perkins, P.A.C.  ANESTHESIA:  General.  ESTIMATED BLOOD LOSS:  675 mL.  DRAINS:  Hemovac x1.  COMPLICATIONS:  None.  CONDITION:  Stable to recovery.  BRIEF CLINICAL NOTE:  Mycal is a 66 year old male with morbid obesity, BMI of 55.35, who had a total hip arthroplasty done several years ago. He has gone on to develop progressive pain in the right groin and his x- ray showed loosening of migration of his acetabular component.  There were concerns about this possibly being a by-product of his a metal and metal hip.  He presents now for acetabular versus total hip arthroplasty revision.  PROCEDURE IN DETAIL:  After successful administration of general anesthetic, the patient was placed in the left lateral decubitus position with the right side up and held with the hip positioner.  Extra padding had to be utilized due to his morbid obesity.  When we felt he was adequately padded, the right lower extremity was isolated from his perineum with plastic drapes, and prepped and draped in the usual sterile fashion.  Posterolateral incision was made with a 10-blade through a very deep layer subcutaneous tissue approximately 10-12 inches in thickness.  Fascia was identified and incised in line with the skin incision.  The sciatic nerve was palpated and protected, and the posterior pseudocapsule excised off the femur.  There was a very  small amount of fluid in the joint.  The tissues did not have any metallic staining.  No evidence of any metal wear.  We did send some periarticular tissues to pathology for evaluation.  The hip was then dislocated.  The femoral head was removed off the trunnion.  Very mild corrosion on the trunnion.  The femoral component was in good position. However, there was some corrosion on the femoral neck, and because of this, and since S-ROM hip prosthesis which could be removed without bony compromise, we decided to remove the stem.  The extractor chisel was then placed between the stem and the sleeve to disrupt the interface between them.  Once disrupted, the loop extractor was placed around the femoral neck and the femoral component was extracted.  The sleeve was well ingrown with no peri-sleeve lysis.  The femur was then retracted anteriorly to gain access to the acetabulum.  The acetabular component had migrated proximally and tilted into excess anteversion.  There was a shelf of bone over the superior part of the component that needed to be removed.  We saved this for bone grafting later.  I was then able to circumferentially see the edges of the shell and liner, and was able to disrupt  the interface between the shell and liner, removing the liner.  There was no evidence of any corrosion or scratching on the liner.  We removed the 2 screws from the shell.  The shell was then removed without any bone loss.  I assessed the acetabular bed.  Anterior and posterior columns were intact.  We did have some bone loss medially, but it was not into the pelvis.  Also some bone loss superiorly.  58 shell was removed.  I started reaming at 55 coursing increments up to 59.  We did get some good cancellous bone at a 59 reamer and used that to pack up into the superior medial defect.  I used a 60-mm pinnacle revision acetabular shell with the deep profile.  This was impacted in anatomic position with  excellent purchase.  I placed 2 additional dome screws and 2 additional peripheral screws.  We had a very stable fixation.  I was very pleased with the position and with the fixation.  Then, placed the 36-mm neutral +4 marathon liner into the acetabular shell.  We noted that there was a small crack in the greater trochanter.  It was not a complete fracture and was not displaced.  The retractors were placed around the proximal femur and then the new femoral stem is placed.  The same size as the stem was removed.  Placed a 22 x 17 stem with a 36+/12 neck.  It placed matching the patient's native anteversion.  The stem was impacted without difficulty.  The trial 36+ 6 head was placed.  Hips reduced with outstanding stability.  There is full extension and there is full external rotation, 70 degrees flexion, 40 degrees adduction, 90 degrees internal rotation, 90 degrees flexion, 70 degrees of internal rotation.  By placing the right leg on top of the left, it felt as though the leg lengths were equal.  Hip was dislocated, all trials removed.  The permanent 36+ 6 ceramic head was placed onto the femoral neck and the hip was reduced same stability parameters. Wounds were copiously irrigated with saline solution, and then the small crack in the trochanter was fixed with a 16-gauge wire with heavy sutures.  There was a solid fixation and trochanter did not move at all anymore.  The short rotators and pseudocapsule reattached to the femur through drill holes with Ethibond suture.  The wound was copiously irrigated again.  Fascia lata was closed over Hemovac drain with a running #1 V-Loc suture.  The deep subcu tissues closed with running 1 V- Loc.  Two other layers of subcu closed with interrupted 2-0 Vicryl, and subcuticular running 4-0 Monocryl.  Incisions cleaned and dried, and Steri-Strips and a bulky sterile dressing applied.  Drains hooked to suction.  He was placed into a knee immobilizer  awakened and transported to recovery in stable condition.  Please note that a surgical assistant was an absolute medical necessity for this procedure.  Surgical assistant was necessary for retracting the vital neurovascular structures and providing exposure to the acetabulum and femur to prevent damage and to allow for anatomic placement of prosthesis, also specially needed given the morbid obesity of this patient.     Ollen Gross, M.D.     FA/MEDQ  D:  03/17/2012  T:  03/18/2012  Job:  [id number]

## 2012-03-18 NOTE — Progress Notes (Signed)
Clinical Social Work Department BRIEF PSYCHOSOCIAL ASSESSMENT 03/18/2012  Patient:  [name], [name]     Account Number:  [id number]     Admit date:  03/17/2012  Clinical Social Worker:  Skip Mayer  Date/Time:  03/18/2012 02:00 PM  Referred by:  Physician  Date Referred:  03/18/2012 Referred for  SNF Placement   Other Referral:   Interview type:  Patient Other interview type:   Pt's friend, Fayrene Fearing, at bedside    PSYCHOSOCIAL DATA Living Status:  ALONE Admitted from facility:   Level of care:   Primary support name:  Fayrene Fearing [phone] Primary support relationship to patient:  FRIEND Degree of support available:   Adequate    CURRENT CONCERNS Current Concerns  Post-Acute Placement   Other Concerns:    SOCIAL WORK ASSESSMENT / PLAN CSW met with pt and pt's friend, Fayrene Fearing, re: PT recommendation for SNF.  CSW explained placement process and answered questions.  Pt requesting SNF in [facility], preferrably [facility] in [place] as it is close to his home.  Pt's friend, Fayrene Fearing, to tour SNFs in [place] to assist pt in choice.  CSW to initiate SNF search and will f/u with offers.  Anticipate d/c Saturday 7/20.  FL2 on chart for MD signture.   Assessment/plan status:  Information/Referral to [facility] Other assessment/ plan:   Information/referral to community resources:   SNF list    PATIENT'S/FAMILY'S RESPONSE TO PLAN OF CARE: Pt reports agreeable to ST SNF in order to increase strength and independence with mobility prior to return home alone.  Pt prefers SNF in [facility], but agreeable to [facility] as b/u plan.        Dellie Burns, MSW, [place] [phone] (coverage)

## 2012-03-18 NOTE — Evaluation (Signed)
Physical Therapy Evaluation Patient Details Name: [name] MRN: [id number] DOB: [birth year] Today's Date: 03/18/2012 Time: 4098-1191 PT Time Calculation (min): 34 min  PT Assessment / Plan / Recommendation Clinical Impression  66 yo male s/p R THA revision. Requiring +2 assist for all mobility. Moderately limited by pain during eval. Recommend ST rehab at SNF prior to returning home alone. Pt states that he would like to return home if all goes well, however he is open to rehab if needed.    PT Assessment  Patient needs continued PT services    Follow Up Recommendations  Skilled nursing facility    Barriers to Discharge Decreased caregiver support      Equipment Recommendations  Defer to next venue    Recommendations for Other Services OT consult   Frequency 7X/week    Precautions / Restrictions Precautions Precautions: Posterior Hip Precaution Comments: Reviewed hip precautions and PWB status Restrictions Weight Bearing Restrictions: Yes RLE Weight Bearing: Partial weight bearing RLE Partial Weight Bearing Percentage or Pounds: 25-50%   Pertinent Vitals/Pain       Mobility  Bed Mobility Bed Mobility: Supine to Sit Supine to Sit: HOB elevated;With rails;1: +2 Total assist Supine to Sit: Patient Percentage: 60% Details for Bed Mobility Assistance: VCs safety, technique, hand placement. Assist for bil LEs off bed and trunk to upright. Increased time. Heavy reliance on trapeze.  Transfers Transfers: Sit to Stand;Stand to Sit;Stand Pivot Transfers Sit to Stand: From elevated surface;1: +2 Total assist;From bed;With upper extremity assist Sit to Stand: Patient Percentage: 60% Stand to Sit: To chair/3-in-1;1: +2 Total assist;With upper extremity assist;With armrests Stand to Sit: Patient Percentage: 60% Stand Pivot Transfers: 1: +2 Total assist Stand Pivot Transfers: Patient Percentage: 60% Details for Transfer Assistance: VCs safety, technique, hand placement. Assist  to rise, stabilize, control descent, navigate with RW.  Ambulation/Gait Ambulation/Gait Assistance: Not tested (comment)    Exercises     PT Diagnosis: Difficulty walking;Abnormality of gait;Acute pain  PT Problem List: Decreased strength;Decreased range of motion;Decreased activity tolerance;Decreased mobility;Pain;Decreased knowledge of precautions;Decreased knowledge of use of DME;Obesity;Decreased balance PT Treatment Interventions: DME instruction;Gait training;Functional mobility training;Therapeutic activities;Therapeutic exercise;Patient/family education;Balance training   PT Goals Acute Rehab PT Goals PT Goal Formulation: With patient Time For Goal Achievement: 03/25/12 Potential to Achieve Goals: Good Pt will go Supine/Side to Sit: with min assist PT Goal: Supine/Side to Sit - Progress: Goal set today Pt will go Sit to Supine/Side: with min assist PT Goal: Sit to Supine/Side - Progress: Goal set today Pt will go Sit to Stand: with min assist PT Goal: Sit to Stand - Progress: Goal set today Pt will Transfer Bed to Chair/Chair to Bed: with min assist PT Transfer Goal: Bed to Chair/Chair to Bed - Progress: Goal set today Pt will Ambulate: 51 - 150 feet;with min assist;with least restrictive assistive device PT Goal: Ambulate - Progress: Goal set today  Visit Information  Last PT Received On: 03/18/12 Assistance Needed: +2    Subjective Data  Subjective: "I know this is gonna hurt" Patient Stated Goal: Home if possible. But open to rehab   Prior Functioning  Home Living Lives With: Alone Type of Home: House Home Access: Ramped entrance Home Adaptive Equipment: Crutches;Wheelchair - manual;Hospital bed (trapeze) Prior Function Level of Independence: Needs assistance Needs Assistance: Light Housekeeping Communication Communication: No difficulties    Cognition  Overall Cognitive Status: Appears within functional limits for tasks  assessed/performed Arousal/Alertness: Awake/alert Orientation Level: Appears intact for tasks assessed Behavior During Session: [facility]  for tasks performed    Extremity/Trunk Assessment Right Lower Extremity Assessment RLE ROM/Strength/Tone: Deficits;Unable to fully assess RLE ROM/Strength/Tone Deficits: Difficult to assess due to body habitus. Hip Abd/add 2/5. Moves ankle well.  Left Lower Extremity Assessment LLE ROM/Strength/Tone: Unable to fully assess LLE ROM/Strength/Tone Deficits: Difficult to assess due to body habitus. Able to weightbear without difficulty   Balance    End of Session PT - End of Session Equipment Utilized During Treatment: Gait belt Activity Tolerance: Patient limited by pain Patient left: in chair;with call bell/phone within reach  GP     Rebeca Alert [facility] 03/18/2012, 12:08 PM [phone]

## 2012-03-18 NOTE — Clinical Documentation Improvement (Signed)
BMI DOCUMENTATION CLARIFICATION QUERY  THIS DOCUMENT IS NOT A PERMANENT PART OF THE MEDICAL RECORD  TO RESPOND TO THE THIS QUERY, FOLLOW THE INSTRUCTIONS BELOW:  1. If needed, update documentation for the patient's encounter via the notes activity.  2. Access this query again and click edit on the In [employer].  3. After updating, or not, click F2 to complete all highlighted (required) fields concerning your review. Select "additional documentation in the medical record" OR "no additional documentation provided".  4. Click Sign note button.  5. The deficiency will fall out of your In Basket *Please let us know if you are not able to complete this workflow by phone or e-mail (listed below).         03/18/12  Dear Dr.  [name], [name]  In an effort to better capture your patient's severity of illness, reflect appropriate length of stay and utilization of resources, a review of the patient medical record has revealed the following indicators.    Based on your clinical judgment, please clarify and document in a progress note and/or discharge summary the clinical condition associated with the following supporting information:  In responding to this query please exercise your independent judgment.  The fact that a query is asked, does not imply that any particular answer is desired or expected.  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    Please clarify whether or not abnormal "BMI"  And "Sleep apnea"can be linked to one or more  of the diagnoses listed below and document in pn  and d/c. Thank You!  BEST PRACTICE: When linking BMI to a diagnosis please document both BMI and diagnosis together in pn for accuracy of SOI and ROM.  BEST PRACTICE: According to NIH "obesity leads to a number of sleep-disordered breathing patterns like  obstructive sleep apnea and obesity hypoventilation syndrome (OHS), leading to increased morbidity and mortality with reduced quality of life."  Reference:  Dabal, L.,& Ahmed, B. (2009). Obesity hypoventilation syndrome. Retrieved from [employer].    Possible Clinical conditions (Please document all that apply)  Morbid Obesity W/ BMI= 53.59  Obesity Hypoventilation Syndrome (OHS)  Underweight w/BMI=  Other condition___________________  Cannot Clinically determine _____________  Risk Factors: Loosening of prosthetic hip/Sleep Apnea/Obesity/GERD/H/O Tear lateral meniscus/OA  Sign & Symptoms: Weight:  5'9" Height 362lbs BMI= 53.59   Treatment CPAP O2 @ 2L/[place] Reg Diet Intake and Output montoring   Reviewed: additional documentation in the medical record- documented in operative report  Thank You,  [name]  RN, BSN, CCDS Clinical Documentation Specialist [facility] HIM Dept Pager: [phone] / E-mail: [email or web address] Health Information Management 

## 2012-03-18 NOTE — Progress Notes (Signed)
Physical Therapy Treatment Patient Details Name: [name] MRN: [id number] DOB: October 26, 1945 Today's Date: 03/18/2012 Time: 1340-1400 PT Time Calculation (min): 20 min  PT Assessment / Plan / Recommendation Comments on Treatment Session  Pt c/o dizziness/lightheadedness once sat up from reclined position in chair. Assisted pt back to bed after sitting for several minutes at edge of chair.Continue to recommend ST rehab at [facility].     Follow Up Recommendations  Skilled nursing facility    Barriers to Discharge Decreased caregiver support      Equipment Recommendations  Defer to next venue    Recommendations for Other Services OT consult  Frequency 7X/week   Plan Discharge plan remains appropriate    Precautions / Restrictions Precautions Precautions: Posterior Hip Precaution Comments: Reviewed hip precautions and PWB status Restrictions Weight Bearing Restrictions: Yes RLE Weight Bearing: Partial weight bearing RLE Partial Weight Bearing Percentage or Pounds: 25-50%   Pertinent Vitals/Pain 5/10 after transfer and ROM exercises    Mobility  Bed Mobility Bed Mobility: Sit to Supine Supine to Sit: HOB elevated;With rails;1: +2 Total assist Supine to Sit: Patient Percentage: 60% Sit to Supine: 1: +2 Total assist Sit to Supine: Patient Percentage: 50% Details for Bed Mobility Assistance: VCs safety, technique, hand placement. assist for bil LEs onto bed and trunk to supine.  Transfers Transfers: Sit to Stand;Stand to Sit Sit to Stand: 1: +2 Total assist;From chair/3-in-1;With upper extremity assist;With armrests Sit to Stand: Patient Percentage: 60% Stand to Sit: 1: +2 Total assist;To bed;With upper extremity assist Stand to Sit: Patient Percentage: 60% Stand Pivot Transfers: 1: +2 Total assist Stand Pivot Transfers: Patient Percentage: 60% Details for Transfer Assistance: VCs safety, technique, hand placement. Assist to rise, stabilize, control descent, navigate with RW.  Recliner > bed stand pivot.  Ambulation/Gait Ambulation/Gait Assistance: Not tested (comment)    Exercises Total Joint Exercises Ankle Circles/Pumps: AROM;Both;10 reps;Supine Quad Sets: AROM;Both;10 reps;Supine Heel Slides: AAROM;Right;10 reps;Supine Hip ABduction/ADduction: AAROM;Right;10 reps;Supine   PT Diagnosis: Difficulty walking;Abnormality of gait;Acute pain  PT Problem List: Decreased strength;Decreased range of motion;Decreased activity tolerance;Decreased mobility;Pain;Decreased knowledge of precautions;Decreased knowledge of use of DME;Obesity;Decreased balance PT Treatment Interventions: DME instruction;Gait training;Functional mobility training;Therapeutic activities;Therapeutic exercise;Patient/family education;Balance training   PT Goals Acute Rehab PT Goals PT Goal Formulation: With patient Time For Goal Achievement: 03/25/12 Potential to Achieve Goals: Good Pt will go Supine/Side to Sit: with min assist PT Goal: Supine/Side to Sit - Progress: Goal set today Pt will go Sit to Supine/Side: with min assist PT Goal: Sit to Supine/Side - Progress: Progressing toward goal Pt will go Sit to Stand: with min assist PT Goal: Sit to Stand - Progress: Progressing toward goal Pt will Transfer Bed to Chair/Chair to Bed: with min assist PT Transfer Goal: Bed to Chair/Chair to Bed - Progress: Progressing toward goal Pt will Ambulate: 51 - 150 feet;with min assist;with least restrictive assistive device PT Goal: Ambulate - Progress: Goal set today  Visit Information  Last PT Received On: 03/18/12 Assistance Needed: +2    Subjective Data  Subjective: "I just can't get my bearings." Patient Stated Goal: None stated   Cognition  Overall Cognitive Status: Appears within functional limits for tasks assessed/performed Arousal/Alertness: Awake/alert Orientation Level: Appears intact for tasks assessed Behavior During Session: [facility] for tasks performed    Balance     End of  Session PT - End of Session Equipment Utilized During Treatment: Gait belt Activity Tolerance: Patient limited by pain (Limited by lightheadedness) Patient left: in bed;with call bell/phone  within reach;with family/visitor present   GP     Rebeca Alert [facility] 03/18/2012, 2:06 PM [phone]

## 2012-03-18 NOTE — Progress Notes (Signed)
   Subjective: 1 Day Post-Op Procedure(s) (LRB): ACETABULAR REVISION (Right) Patient reports pain as moderate and severe.   Patient seen in rounds by Dr. [name].   Patient is having problems with pain in the back, requiring pain medications We will start therapy today.  Plan is to go Skilled nursing facility after hospital stay.  Objective: Vital signs in last 24 hours: Temp:  [97.5 F (36.4 C)-98.7 F (37.1 C)] 98.5 F (36.9 C) (07/18 0518) Pulse Rate:  [74-84] 81  (07/18 0518) Resp:  [12-18] 18  (07/18 0518) BP: (122-170)/(52-82) 150/66 mmHg (07/18 0518) SpO2:  [95 %-100 %] 96 % (07/18 0518) Weight:  [164.6 kg (362 lb 14 oz)] 164.6 kg (362 lb 14 oz) (07/17 2300)  Intake/Output from previous day:  Intake/Output Summary (Last 24 hours) at 03/18/12 0852 Last data filed at 03/18/12 0600  Gross per 24 hour  Intake   5360 ml  Output   4990 ml  Net    370 ml    Intake/Output this shift: UOP 1300 since MN  Labs:  [facility] 03/18/12 0410  HGB 11.1*    Basename 03/18/12 0410  WBC 6.8  RBC 3.69*  HCT 33.8*  PLT 120*    Basename 03/18/12 0410  NA 134*  K 3.9  CL 101  CO2 26  BUN 15  CREATININE 1.03  GLUCOSE 115*  CALCIUM 8.5   No results found for this basename: LABPT:2,INR:2 in the last 72 hours  EXAM General - Patient is Alert, Appropriate and Oriented Extremity - Neurovascular intact Sensation intact distally Dorsiflexion/Plantar flexion intact Dressing - dressing C/D/I Motor Function - intact, moving foot and toes well on exam.  Hemovac pulled without difficulty.  Past Medical History  Diagnosis Date  . Hypertension   . Depression   . Sleep apnea   . GERD (gastroesophageal reflux disease)   . Arthritis   . History of blood transfusion   . Hemorrhoids   . Insomnia   . Anginal pain 2006    evaluated by cardio    Assessment/Plan: 1 Day Post-Op Procedure(s) (LRB): ACETABULAR REVISION (Right) Principal Problem:  *Mechanical complication of  hip prosthesis   Advance diet Up with therapy Continue foley due to strict I&O and urinary output monitoring Discharge to SNF  DVT Prophylaxis - Xarelto PWB 25-50% to right leg due to having revision surgery. D/C Knee Immobilizer Hemovac Pulled Begin Therapy Hip Preacutions Keep foley until tomorrow. No vaccines.  Asriel Westrup 03/18/2012, 8:52 AM

## 2012-03-19 LAB — BASIC METABOLIC PANEL
BUN: 12 mg/dL (ref 6–23)
CO2: 29 mEq/L (ref 19–32)
Calcium: 8.5 mg/dL (ref 8.4–10.5)
Chloride: 100 mEq/L (ref 96–112)
Creatinine, Ser: 1.03 mg/dL (ref 0.50–1.35)
GFR calc Af Amer: 86 mL/min — ABNORMAL LOW (ref >90)
GFR calc non Af Amer: 74 mL/min — ABNORMAL LOW (ref >90)
Glucose, Bld: 111 mg/dL — ABNORMAL HIGH (ref 70–99)
Potassium: 3.6 mEq/L (ref 3.5–5.1)
Sodium: 136 mEq/L (ref 135–145)

## 2012-03-19 LAB — CBC
HCT: 29.6 % — ABNORMAL LOW (ref 39.0–52.0)
Hemoglobin: 9.7 g/dL — ABNORMAL LOW (ref 13.0–17.0)
MCH: 29.9 pg (ref 26.0–34.0)
MCHC: 32.8 g/dL (ref 30.0–36.0)
MCV: 91.4 fL (ref 78.0–100.0)
Platelets: 109 10*3/uL — ABNORMAL LOW (ref 150–400)
RBC: 3.24 MIL/uL — ABNORMAL LOW (ref 4.22–5.81)
RDW: 13.6 % (ref 11.5–15.5)
WBC: 7.7 10*3/uL (ref 4.0–10.5)

## 2012-03-19 MED ORDER — ACETAMINOPHEN 325 MG PO TABS: 650.0000 mg | ORAL_TABLET | Freq: Four times a day (QID) | ORAL | Status: AC | PRN

## 2012-03-19 MED ORDER — RIVAROXABAN 10 MG PO TABS: 10.0000 mg | ORAL_TABLET | Freq: Every day | ORAL | Status: DC

## 2012-03-19 MED ORDER — METHOCARBAMOL 500 MG PO TABS: 500.0000 mg | ORAL_TABLET | Freq: Four times a day (QID) | ORAL | Status: AC | PRN

## 2012-03-19 MED ORDER — ONDANSETRON HCL 4 MG PO TABS: 4.0000 mg | ORAL_TABLET | Freq: Four times a day (QID) | ORAL | Status: AC | PRN

## 2012-03-19 MED ORDER — CALCIUM CARBONATE ANTACID 500 MG PO CHEW: 1.0000 | CHEWABLE_TABLET | Freq: Four times a day (QID) | ORAL | Status: AC | PRN

## 2012-03-19 MED ORDER — ALUM & MAG HYDROXIDE-SIMETH 200-200-20 MG/5ML PO SUSP: 30.0000 mL | Freq: Four times a day (QID) | ORAL | Status: AC | PRN

## 2012-03-19 MED ORDER — OXYCODONE HCL 5 MG PO TABS: 5.0000 mg | ORAL_TABLET | ORAL | Status: AC | PRN

## 2012-03-19 MED ORDER — DSS 100 MG PO CAPS: 100.0000 mg | ORAL_CAPSULE | Freq: Two times a day (BID) | ORAL | Status: AC

## 2012-03-19 MED ORDER — BISACODYL 10 MG RE SUPP: 10.0000 mg | Freq: Every day | RECTAL | Status: AC | PRN

## 2012-03-19 MED ORDER — POLYETHYLENE GLYCOL 3350 17 G PO PACK: 17.0000 g | PACK | Freq: Every day | ORAL | Status: AC | PRN

## 2012-03-19 NOTE — Progress Notes (Signed)
Physical Therapy Treatment Patient Details Name: [name] MRN: [id number] DOB: Apr 05, 1946 Today's Date: 03/19/2012 Time: 7829-5621 PT Time Calculation (min): 16 min  PT Assessment / Plan / Recommendation Comments on Treatment Session  Progressing slowly. Fatigues easily. Recommend SNF    Follow Up Recommendations  Skilled nursing facility    Barriers to Discharge        Equipment Recommendations  Defer to next venue    Recommendations for Other Services OT consult  Frequency 7X/week   Plan Discharge plan remains appropriate    Precautions / Restrictions Precautions Precautions: Posterior Hip Precaution Comments: Reviewed hip precautions and PWB status.  Restrictions Weight Bearing Restrictions: Yes RLE Weight Bearing: Partial weight bearing RLE Partial Weight Bearing Percentage or Pounds: 25-50%   Pertinent Vitals/Pain     Mobility  Bed Mobility Bed Mobility: Supine to Sit Supine to Sit: 1: +2 Total assist;HOB elevated;With rails Supine to Sit: Patient Percentage: 60% Details for Bed Mobility Assistance: VCs safety, technique, hand placement. Assist for bil LEs off bed and trunk to upright. Heavy reliance on trapeze. Increased time and effort. Transfers Transfers: Sit to Stand;Stand to Sit Sit to Stand: 1: +2 Total assist;From bed;With upper extremity assist;From elevated surface Sit to Stand: Patient Percentage: 60% Stand to Sit: 1: +2 Total assist;To chair/3-in-1;With upper extremity assist;With armrests Stand to Sit: Patient Percentage: 60% Details for Transfer Assistance: VCs safety, technique, hand placement. Assist to rise, stabilize, control descent.  Ambulation/Gait Ambulation/Gait Assistance: 1: +2 Total assist Ambulation/Gait: Patient Percentage: 70% Ambulation Distance (Feet): 15 Feet Assistive device: Rolling walker Ambulation/Gait Assistance Details: VCs safety, technique, sequence, adherence to WB precautions. Fatigues easily.  Gait Pattern:  Step-to pattern    Exercises     PT Diagnosis:    PT Problem List:   PT Treatment Interventions:     PT Goals Acute Rehab PT Goals Pt will go Supine/Side to Sit: with min assist PT Goal: Supine/Side to Sit - Progress: Progressing toward goal Pt will go Sit to Stand: with min assist PT Goal: Sit to Stand - Progress: Progressing toward goal Pt will Ambulate: 51 - 150 feet;with min assist;with least restrictive assistive device PT Goal: Ambulate - Progress: Progressing toward goal  Visit Information  Last PT Received On: 03/19/12 Assistance Needed: +2    Subjective Data  Subjective: "I was doing this with my crutches before I came in" Patient Stated Goal: None stated   Cognition  Overall Cognitive Status: Appears within functional limits for tasks assessed/performed Arousal/Alertness: Awake/alert Orientation Level: Appears intact for tasks assessed Behavior During Session: [facility] for tasks performed    Balance     End of Session PT - End of Session Equipment Utilized During Treatment: Gait belt Activity Tolerance: Patient limited by fatigue Patient left: in chair;with call bell/phone within reach;with family/visitor present   GP     Rebeca Alert [facility] 03/19/2012, 11:56 AM [phone]

## 2012-03-19 NOTE — Progress Notes (Signed)
   Subjective: 2 Days Post-Op Procedure(s) (LRB): ACETABULAR REVISION (Right) Patient reports pain as mild.  Pain has improved today. Patient seen in rounds with Dr. [name]. Patient is well, and has had no acute complaints or problems Plan is to go Skilled nursing facility after hospital stay.  Objective: Vital signs in last 24 hours: Temp:  [98.4 F (36.9 C)-100.1 F (37.8 C)] 98.4 F (36.9 C) (07/19 0511) Pulse Rate:  [74-85] 77  (07/19 0511) Resp:  [16-18] 16  (07/19 0511) BP: (116-145)/(56-70) 116/56 mmHg (07/19 0511) SpO2:  [96 %-99 %] 97 % (07/19 0511)  Intake/Output from previous day:  Intake/Output Summary (Last 24 hours) at 03/19/12 0935 Last data filed at 03/19/12 0600  Gross per 24 hour  Intake 1674.17 ml  Output   4550 ml  Net -2875.83 ml    Intake/Output this shift:    Labs:  Basename 03/19/12 0428 03/18/12 0410  HGB 9.7* 11.1*    Basename 03/19/12 0428 03/18/12 0410  WBC 7.7 6.8  RBC 3.24* 3.69*  HCT 29.6* 33.8*  PLT 109* 120*    Basename 03/19/12 0428 03/18/12 0410  NA 136 134*  K 3.6 3.9  CL 100 101  CO2 29 26  BUN 12 15  CREATININE 1.03 1.03  GLUCOSE 111* 115*  CALCIUM 8.5 8.5   No results found for this basename: LABPT:2,INR:2 in the last 72 hours  EXAM General - Patient is Alert, Appropriate and Oriented Extremity - Neurovascular intact Sensation intact distally Dorsiflexion/Plantar flexion intact Dressing/Incision - clean, dry, no drainage, healing Motor Function - intact, moving foot and toes well on exam.   Past Medical History  Diagnosis Date  . Hypertension   . Depression   . Sleep apnea   . GERD (gastroesophageal reflux disease)   . Arthritis   . History of blood transfusion   . Hemorrhoids   . Insomnia   . Anginal pain 2006    evaluated by cardio    Assessment/Plan: 2 Days Post-Op Procedure(s) (LRB): ACETABULAR REVISION (Right) Principal Problem:  *Mechanical complication of hip prosthesis Active  Problems:  Hyponatremia   Up with therapy Discharge to SNF  DVT Prophylaxis - Xarelto PWB 25-50% to right leg due to having revision surgery.   Kamen Hanken 03/19/2012, 9:35 AM

## 2012-03-19 NOTE — Progress Notes (Signed)
Physical Therapy Treatment Patient Details Name: FLYNN GWYN MRN: [id number] DOB: 05/31/1946 Today's Date: 03/19/2012 Time: 4098-1191 PT Time Calculation (min): 19 min  PT Assessment / Plan / Recommendation Comments on Treatment Session  Continuing to progress. Decreased activity tolerance. Continue to recommend SNF.    Follow Up Recommendations  Skilled nursing facility    Barriers to Discharge        Equipment Recommendations  Defer to next venue    Recommendations for Other Services OT consult  Frequency 7X/week   Plan Discharge plan remains appropriate    Precautions / Restrictions Precautions Precautions: Posterior Hip Precaution Comments: Reviewed hip precautions and PWB status.  Restrictions Weight Bearing Restrictions: Yes RLE Weight Bearing: Partial weight bearing RLE Partial Weight Bearing Percentage or Pounds: 25-50%   Pertinent Vitals/Pain 5/10 R hip    Mobility  Bed Mobility Bed Mobility: Sit to Supine Sit to Supine: 1: +2 Total assist Sit to Supine: Patient Percentage: 50% Details for Bed Mobility Assistance: VCs safety, technique, hand placement. Assist for bil LEs onto bed and trunk to supine. Trapeze. Transfers Transfers: Sit to Stand;Stand to Sit Sit to Stand: 1: +2 Total assist;From chair/3-in-1;With upper extremity assist;With armrests Sit to Stand: Patient Percentage: 60% Stand to Sit: 1: +2 Total assist;With upper extremity assist;With armrests;To bed Stand to Sit: Patient Percentage: 60% Details for Transfer Assistance: VCs safety, technique, hand placement. Assist to rise, stabilize, control descent.  Ambulation/Gait Ambulation/Gait Assistance: 1: +2 Total assist Ambulation/Gait: Patient Percentage: 70% Ambulation Distance (Feet): 15 Feet Assistive device: Rolling walker Ambulation/Gait Assistance Details: VCs safety, technique, sequence, adherence to WB precautions. Fatigues easily.  Gait Pattern: Step-to pattern;Decreased stride  length;Decreased step length - right;Decreased step length - left    Exercises     PT Diagnosis:    PT Problem List:   PT Treatment Interventions:     PT Goals Acute Rehab PT Goals Pt will go Supine/Side to Sit: with min assist PT Goal: Supine/Side to Sit - Progress: Progressing toward goal Pt will go Sit to Supine/Side: with min assist PT Goal: Sit to Supine/Side - Progress: Progressing toward goal Pt will go Sit to Stand: with min assist PT Goal: Sit to Stand - Progress: Progressing toward goal Pt will Ambulate: 51 - 150 feet;with min assist;with least restrictive assistive device PT Goal: Ambulate - Progress: Progressing toward goal  Visit Information  Last PT Received On: 03/19/12 Assistance Needed: +2    Subjective Data  Subjective: "How long before I regain my mobility" Patient Stated Goal: Move around better   Cognition  Overall Cognitive Status: Appears within functional limits for tasks assessed/performed Arousal/Alertness: Awake/alert Orientation Level: Appears intact for tasks assessed Behavior During Session: [facility] for tasks performed    Balance     End of Session PT - End of Session Equipment Utilized During Treatment: Gait belt Activity Tolerance: Patient limited by fatigue Patient left: in bed;with call bell/phone within reach   GP     Rebeca Alert [place] 03/19/2012, 2:40 PM [phone]

## 2012-03-19 NOTE — Discharge Summary (Signed)
Physician Discharge Summary   Patient ID: [name] MRN: [id number] DOB/AGE: 66-Aug-1947 66 y.o.  Admit date: 03/17/2012 Discharge date: 03/20/2012  Primary Diagnosis: Loosening of prosthetic hip  Admission Diagnoses:  Past Medical History  Diagnosis Date  . Hypertension   . Depression   . Sleep apnea   . GERD (gastroesophageal reflux disease)   . Arthritis   . History of blood transfusion   . Hemorrhoids   . Insomnia   . Anginal pain 2006    evaluated by cardio   Discharge Diagnoses:   Principal Problem:  *Mechanical complication of hip prosthesis Active Problems:  Hyponatremia  Procedure: Procedure(s) (LRB): ACETABULAR REVISION (Right)   Consults: None  HPI: [name] is a 66 year old male with morbid obesity, BMI of 55.35, who had a total hip arthroplasty done several years ago. He has gone on to develop progressive pain in the right groin and his x- ray showed loosening of migration of his acetabular component. There were concerns about this possibly being a by-product of his a metal and metal hip. He presents now for acetabular versus total hip arthroplasty revision.  Laboratory Data: Hospital Outpatient Visit on 03/11/2012  Component Date Value Range Status  . aPTT 03/11/2012 37  24 - 37 seconds Final   Comment:                                 IF BASELINE aPTT IS ELEVATED,                          SUGGEST PATIENT RISK ASSESSMENT                          BE USED TO DETERMINE APPROPRIATE                          ANTICOAGULANT THERAPY.  . WBC 03/11/2012 4.8  4.0 - 10.5 K/uL Final  . RBC 03/11/2012 4.09* 4.22 - 5.81 MIL/uL Final  . Hemoglobin 03/11/2012 12.3* 13.0 - 17.0 g/dL Final  . HCT 40/98/1191 37.8* 39.0 - 52.0 % Final  . MCV 03/11/2012 92.4  78.0 - 100.0 fL Final  . MCH 03/11/2012 30.1  26.0 - 34.0 pg Final  . MCHC 03/11/2012 32.5  30.0 - 36.0 g/dL Final  . RDW 47/82/9562 13.3  11.5 - 15.5 % Final  . Platelets 03/11/2012 152  150 - 400 K/uL Final  .  Sodium 03/11/2012 134* 135 - 145 mEq/L Final  . Potassium 03/11/2012 4.5  3.5 - 5.1 mEq/L Final  . Chloride 03/11/2012 98  96 - 112 mEq/L Final  . CO2 03/11/2012 27  19 - 32 mEq/L Final  . Glucose, Bld 03/11/2012 88  70 - 99 mg/dL Final  . BUN 13/04/6577 21  6 - 23 mg/dL Final  . Creatinine, Ser 03/11/2012 1.14  0.50 - 1.35 mg/dL Final  . Calcium 46/96/2952 9.2  8.4 - 10.5 mg/dL Final  . Total Protein 03/11/2012 7.0  6.0 - 8.3 g/dL Final  . Albumin 84/13/2440 3.6  3.5 - 5.2 g/dL Final  . AST 06/28/2535 27  0 - 37 U/L Final  . ALT 03/11/2012 20  0 - 53 U/L Final  . Alkaline Phosphatase 03/11/2012 109  39 - 117 U/L Final  . Total Bilirubin 03/11/2012 0.3  0.3 - 1.2 mg/dL Final  .  GFR calc non Af Amer 03/11/2012 66* >90 mL/min Final  . GFR calc Af Amer 03/11/2012 76* >90 mL/min Final   Comment:                                 The eGFR has been calculated                          using the CKD EPI equation.                          This calculation has not been                          validated in all clinical                          situations.                          eGFR's persistently                          <90 mL/min signify                          possible Chronic Kidney Disease.  Marland Kitchen Prothrombin Time 03/11/2012 14.1  11.6 - 15.2 seconds Final  . INR 03/11/2012 1.07  0.00 - 1.49 Final  . Color, Urine 03/11/2012 YELLOW  YELLOW Final  . APPearance 03/11/2012 CLEAR  CLEAR Final  . Specific Gravity, Urine 03/11/2012 1.013  1.005 - 1.030 Final  . pH 03/11/2012 6.5  5.0 - 8.0 Final  . Glucose, UA 03/11/2012 NEGATIVE  NEGATIVE mg/dL Final  . Hgb urine dipstick 03/11/2012 NEGATIVE  NEGATIVE Final  . Bilirubin Urine 03/11/2012 NEGATIVE  NEGATIVE Final  . Ketones, ur 03/11/2012 NEGATIVE  NEGATIVE mg/dL Final  . Protein, ur 45/40/9811 NEGATIVE  NEGATIVE mg/dL Final  . Urobilinogen, UA 03/11/2012 0.2  0.0 - 1.0 mg/dL Final  . Nitrite 91/47/8295 NEGATIVE  NEGATIVE Final  . Leukocytes,  UA 03/11/2012 NEGATIVE  NEGATIVE Final   MICROSCOPIC NOT DONE ON URINES WITH NEGATIVE PROTEIN, BLOOD, LEUKOCYTES, NITRITE, OR GLUCOSE <1000 mg/dL.  Marland Kitchen MRSA, PCR 03/11/2012 NEGATIVE  NEGATIVE Final  . Staphylococcus aureus 03/11/2012 NEGATIVE  NEGATIVE Final   Comment:                                 The Xpert SA Assay (FDA                          approved for NASAL specimens                          only), is one component of                          a comprehensive surveillance                          program.  It is not intended  to diagnose infection nor to                          guide or monitor treatment.    Basename 03/19/12 0428 03/18/12 0410  HGB 9.7* 11.1*    Basename 03/19/12 0428 03/18/12 0410  WBC 7.7 6.8  RBC 3.24* 3.69*  HCT 29.6* 33.8*  PLT 109* 120*    Basename 03/19/12 0428 03/18/12 0410  NA 136 134*  K 3.6 3.9  CL 100 101  CO2 29 26  BUN 12 15  CREATININE 1.03 1.03  GLUCOSE 111* 115*  CALCIUM 8.5 8.5   No results found for this basename: LABPT:2,INR:2 in the last 72 hours  X-Rays:Dg Chest 2 View  03/11/2012  *RADIOLOGY REPORT*  Clinical Data: Preop respiratory exam for right hip prosthesis revision.  Hypertension.  CHEST - 2 VIEW  Comparison: 09/12/2004  Findings: Mild scarring again seen in the left lung base.  No evidence of pulmonary infiltrate or edema.  No evidence of pleural effusion.  Heart size is normal.  No mass or lymphadenopathy identified.  IMPRESSION: Stable mild left basilar scarring.  No active disease.  Original Report Authenticated By: Danae Orleans, M.D.   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  Lucency along the acetabular metallic bone interface evident, suspicious for loosening.  Hardware  components appear aligned.  Left hip arthroplasty also noted.  Pelvis appears intact.  Degenerative changes of the spine.  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  Original Report Authenticated By: Judie Petit. Ruel Favors, M.D.   Dg Pelvis Portable  03/17/2012  *RADIOLOGY REPORT*  Clinical Data: Loose implant.  PORTABLE PELVIS  Comparison: 03/11/2012  Findings: The patient has undergone right total hip arthroplasty revision.  Satisfactory position and alignment.  IMPRESSION: As above.  Original Report Authenticated By: Elsie Stain, M.D.   Dg Hip Portable 1 View Right  03/17/2012  *RADIOLOGY REPORT*  Clinical Data: Postop right hip  PORTABLE RIGHT HIP - 1 VIEW  Comparison: None.  Findings: Patient is status post right THA revision.  Satisfactory position and alignment.  IMPRESSION: As above.  Original Report Authenticated By: Elsie Stain, M.D.    EKG: Orders placed during the hospital encounter of 03/11/12  . EKG 12-LEAD  . EKG 12-LEAD     Hospital Course: Patient was admitted to [facility] and taken to the OR and underwent the above state procedure without complications.  Patient tolerated the procedure well and was later transferred to the recovery room and then to the orthopaedic floor for postoperative care.  They were given PO and IV analgesics for pain control following their surgery.  They were given 24 hours of postoperative antibiotics and started on DVT prophylaxis in the form of Xarelto.   PT and OT were ordered for total hip protocol.  The patient was allowed to be PWB 25-50% with therapy. Discharge planning was consulted to help with postop disposition and equipment needs.  Patient had a very tough night on the evening of surgery because of pain but started to get up OOB with therapy on day one.  Hemovac drain was pulled without difficulty.  The knee immobilizer was removed and  discontinued.  Continued  to work with therapy into day two.  Pain was improved by day two.  Dressing was changed on day two and the incision was healing well.  The patient was seen on day two which was Friday.  The plan was to transfer over to [facility] the next day, Saturday, 03/20/2012.  This summary being setup in anticipation of transfer of the patient the next day.  He will be seen on Saturday by the weekend coverage and plans to transfer if doing well.  Discharge Medications: Prior to Admission medications   Medication Sig Start Date End Date Taking? Authorizing Provider  amLODipine (NORVASC) 5 MG tablet Take 5 mg by mouth daily with breakfast.   Yes Historical Provider, MD  celecoxib (CELEBREX) 200 MG capsule Take 200 mg by mouth 2 (two) times daily.   Yes Historical Provider, MD  cyclobenzaprine (FLEXERIL) 10 MG tablet Take 10 mg by mouth 2 (two) times daily. Muscle spasm   Yes Historical Provider, MD  hydroxypropyl methylcellulose (ISOPTO TEARS) 2.5 % ophthalmic solution Place 1 drop into both eyes 3 (three) times daily as needed. Dry eyes   Yes Historical Provider, MD  losartan-hydrochlorothiazide (HYZAAR) 50-12.5 MG per tablet Take 1 tablet by mouth daily with breakfast.   Yes Historical Provider, MD  methadone (DOLOPHINE) 10 MG tablet Take 20 mg by mouth every 6 (six) hours.    Yes Historical Provider, MD  omeprazole (PRILOSEC) 40 MG capsule Take 40 mg by mouth daily.   Yes Historical Provider, MD  Psyllium (METAMUCIL PO) Take by mouth. 3-4 scoops daily   Yes Historical Provider, MD  venlafaxine XR (EFFEXOR-XR) 150 MG 24 hr capsule Take 300 mg by mouth daily with breakfast.   Yes Historical Provider, MD  acetaminophen (TYLENOL) 325 MG tablet Take 2 tablets (650 mg total) by mouth every 6 (six) hours as needed (or Fever >/= 101). 03/19/12 03/19/13  Conor Lata, PA  alum & mag hydroxide-simeth (MAALOX/MYLANTA) 200-200-20 MG/5ML suspension Take 30 mLs by mouth every 6 (six) hours as  needed. 03/19/12 03/29/12  Cheria Sadiq, PA  bisacodyl (DULCOLAX) 10 MG suppository Place 1 suppository (10 mg total) rectally daily as needed. 03/19/12 03/29/12  Woodroe Vogan, PA  calcium carbonate (TUMS - DOSED IN MG ELEMENTAL CALCIUM) 500 MG chewable tablet Chew 1 tablet (200 mg of elemental calcium total) by mouth every 6 (six) hours as needed. 03/19/12 03/19/13  Deniss Wormley, PA  docusate sodium 100 MG CAPS Take 100 mg by mouth 2 (two) times daily. 03/19/12 03/29/12  Estie Sproule, PA  methocarbamol (ROBAXIN) 500 MG tablet Take 1 tablet (500 mg total) by mouth every 6 (six) hours as needed. 03/19/12 03/29/12  Rylyn Zawistowski, PA  ondansetron (ZOFRAN) 4 MG tablet Take 1 tablet (4 mg total) by mouth every 6 (six) hours as needed for nausea. 03/19/12 03/26/12  Sheilyn Boehlke, PA  oxyCODONE (OXY IR/ROXICODONE) 5 MG immediate release tablet Take 1-4 tablets (5-20 mg total) by mouth every 4 (four) hours as needed. 03/19/12 03/29/12  Versie Soave, PA  polyethylene glycol (MIRALAX / GLYCOLAX) packet Take 17 g by mouth daily as needed. 03/19/12 03/22/12  Ahnaf Caponi Julien Girt, PA  rivaroxaban (XARELTO) 10 MG TABS tablet Take 1 tablet (10 mg total) by mouth daily with breakfast. Take Xarelto for two and a half more weeks, then discontinue Xarelto. 03/19/12   Zanae Kuehnle Julien Girt, PA    Diet: Cardiac diet Activity:PWB 25-50% No bending hip over 90 degrees- A "L" Angle Do not cross legs Do not let foot roll  inward When turning these patients a pillow should be placed between the patient's legs to prevent crossing. Patients should have the affected knee fully extended when trying to sit or stand from all surfaces to prevent excessive hip flexion. When ambulating and turning toward the affected side the affected leg should have the toes turned out prior to moving the walker and the rest of patient's body as to prevent internal rotation/ turning in of the leg. Abduction pillows are  the most effective way to prevent a patient from not crossing legs or turning toes in at rest. If an abduction pillow is not ordered placing a regular pillow length wise between the patient's legs is also an effective reminder. It is imperative that these precautions be maintained so that the surgical hip does not dislocate. Follow-up:in 2 weeks Disposition - Skilled nursing facility - [facility] Discharged Condition: Pending at the time of this summary, will transfer over to SNF if doing well and improved.   Discharge Orders    Future Orders Please Complete By Expires   Diet - low sodium heart healthy      Call MD / Call 911      Comments:   If you experience chest pain or shortness of breath, CALL 911 and be transported to the hospital emergency room.  If you develope a fever above 101 F, pus (white drainage) or increased drainage or redness at the wound, or calf pain, call your surgeon's office.   Discharge instructions      Comments:   Pick up stool softner and laxative for home. Do not submerge incision under water. May shower. Continue to use ice for pain and swelling from surgery. Hip precautions.  Total Hip Protocol.  Take Xarelto for two and a half more weeks, then discontinue Xarelto.   Constipation Prevention      Comments:   Drink plenty of fluids.  Prune juice may be helpful.  You may use a stool softener, such as Colace (over the counter) 100 mg twice a day.  Use MiraLax (over the counter) for constipation as needed.   Increase activity slowly as tolerated      Patient may shower      Comments:   You may shower without a dressing once there is no drainage.  Do not wash over the wound.  If drainage remains, do not shower until drainage stops.   Driving restrictions      Comments:   No driving until released by the physician.   Lifting restrictions      Comments:   No lifting until released by the physician.   Follow the hip precautions as taught in Physical Therapy       Change dressing      Comments:   You may change your dressing dressing daily with sterile 4 x 4 inch gauze dressing and paper tape.  Do not submerge the incision under water.   TED hose      Comments:   Use stockings (TED hose) for 3 weeks on both leg(s).  You may remove them at night for sleeping.   Do not sit on low chairs, stoools or toilet seats, as it may be difficult to get up from low surfaces        Medication List  As of 03/19/2012  2:10 PM   STOP taking these medications         multivitamin with minerals Tabs      testosterone 50 MG/5GM Gel  VITAMIN C PO      VITAMIN D PO         TAKE these medications         acetaminophen 325 MG tablet   Commonly known as: TYLENOL   Take 2 tablets (650 mg total) by mouth every 6 (six) hours as needed (or Fever >/= 101).      alum & mag hydroxide-simeth 200-200-20 MG/5ML suspension   Commonly known as: MAALOX/MYLANTA   Take 30 mLs by mouth every 6 (six) hours as needed.      amLODipine 5 MG tablet   Commonly known as: NORVASC   Take 5 mg by mouth daily with breakfast.      bisacodyl 10 MG suppository   Commonly known as: DULCOLAX   Place 1 suppository (10 mg total) rectally daily as needed.      calcium carbonate 500 MG chewable tablet   Commonly known as: TUMS - dosed in mg elemental calcium   Chew 1 tablet (200 mg of elemental calcium total) by mouth every 6 (six) hours as needed.      celecoxib 200 MG capsule   Commonly known as: CELEBREX   Take 200 mg by mouth 2 (two) times daily.      cyclobenzaprine 10 MG tablet   Commonly known as: FLEXERIL   Take 10 mg by mouth 2 (two) times daily. Muscle spasm      DSS 100 MG Caps   Take 100 mg by mouth 2 (two) times daily.      hydroxypropyl methylcellulose 2.5 % ophthalmic solution   Commonly known as: ISOPTO TEARS   Place 1 drop into both eyes 3 (three) times daily as needed. Dry eyes      losartan-hydrochlorothiazide 50-12.5 MG per tablet   Commonly known  as: HYZAAR   Take 1 tablet by mouth daily with breakfast.      METAMUCIL PO   Take by mouth. 3-4 scoops daily      methadone 10 MG tablet   Commonly known as: DOLOPHINE   Take 20 mg by mouth every 6 (six) hours.      methocarbamol 500 MG tablet   Commonly known as: ROBAXIN   Take 1 tablet (500 mg total) by mouth every 6 (six) hours as needed.      omeprazole 40 MG capsule   Commonly known as: PRILOSEC   Take 40 mg by mouth daily.      ondansetron 4 MG tablet   Commonly known as: ZOFRAN   Take 1 tablet (4 mg total) by mouth every 6 (six) hours as needed for nausea.      oxyCODONE 5 MG immediate release tablet   Commonly known as: Oxy IR/ROXICODONE   Take 1-4 tablets (5-20 mg total) by mouth every 4 (four) hours as needed.      polyethylene glycol packet   Commonly known as: MIRALAX / GLYCOLAX   Take 17 g by mouth daily as needed.      rivaroxaban 10 MG Tabs tablet   Commonly known as: XARELTO   Take 1 tablet (10 mg total) by mouth daily with breakfast. Take Xarelto for two and a half more weeks, then discontinue Xarelto.      venlafaxine XR 150 MG 24 hr capsule   Commonly known as: EFFEXOR-XR   Take 300 mg by mouth daily with breakfast.           Follow-up Information    Follow up with Loanne Drilling, MD. Schedule an  appointment as soon as possible for a visit in 2 weeks.   Contact information:   [facility] [address] [place] [address] [phone]          Signed: Patrica Duel 03/19/2012, 2:10 PM

## 2012-03-19 NOTE — Evaluation (Signed)
Occupational Therapy Evaluation Patient Details Name: [name] MRN: [id number] DOB: October 21, 1945 Today's Date: 03/19/2012 Time: 2841-3244 OT Time Calculation (min): 16 min  OT Assessment / Plan / Recommendation Clinical Impression  This 66 year old man was admitted for R THA revision.  He is appropriate for skilled OT to increase independence with ADLs with min A level goals in acute    OT Assessment  Patient needs continued OT Services    Follow Up Recommendations  Skilled nursing facility    Barriers to Discharge Decreased caregiver support    Equipment Recommendations  Defer to next venue    Recommendations for Other Services    Frequency  Min 1X/week    Precautions / Restrictions Precautions Precautions: Posterior Hip Precaution Comments: Reviewed hip precautions and PWB status.  Restrictions Weight Bearing Restrictions: Yes RLE Weight Bearing: Partial weight bearing RLE Partial Weight Bearing Percentage or Pounds: 25-50%   Pertinent Vitals/Pain Initially no pain in R hip in bed; 3 with ambulation    ADL  Eating/Feeding: Simulated;Independent Where Assessed - Eating/Feeding: Chair Grooming: Simulated;Set up Where Assessed - Grooming: Supported sitting Upper Body Bathing: Simulated;Set up Where Assessed - Upper Body Bathing: Supported sitting Lower Body Bathing: Simulated;+2 Total assistance (for sit to stand, with AE) Lower Body Bathing: Patient Percentage: 80% (bathing activity; 60% sit to stand) Where Assessed - Lower Body Bathing: Supported sit to stand Upper Body Dressing: Simulated;Set up Where Assessed - Upper Body Dressing: Supported sitting Lower Body Dressing: Simulated;+2 Total assistance (for sit to stand; using AE) Lower Body Dressing: Patient Percentage: 80% (dressing activity: 60% sit to stand) Where Assessed - Lower Body Dressing: Supported sit to stand Toilet Transfer: Chief of Staff: Patient Percentage:  60% Statistician Method:  (ambulate to recliner) Toileting - Clothing Manipulation and Hygiene: Simulated;+2 Total assistance Toileting - Clothing Manipulation and Hygiene: Patient Percentage: 50% Equipment Used: Gait belt;Rolling walker (pt did not feel he needed to review AE) Transfers/Ambulation Related to ADLs: +2 for safety ADL Comments: Pt familiar with AE:  has been using sock aid    OT Diagnosis: Generalized weakness  OT Problem List: Decreased strength;Decreased activity tolerance;Decreased knowledge of use of DME or AE;Decreased knowledge of precautions;Decreased range of motion;Pain OT Treatment Interventions: Self-care/ADL training;DME and/or AE instruction;Patient/family education   OT Goals Acute Rehab OT Goals OT Goal Formulation: With patient Time For Goal Achievement: 03/26/12 Potential to Achieve Goals: Good ADL Goals Pt Will Perform Grooming: with supervision;Standing at sink;Supported ADL Goal: Grooming - Progress: Goal set today Pt Will Transfer to Toilet: with min assist;Ambulation;Extra wide 3-in-1 ADL Goal: Toilet Transfer - Progress: Goal set today Miscellaneous OT Goals Miscellaneous OT Goal #1: Pt will perform LB ADLs with AE, sit to stand with min A  OT Goal: Miscellaneous Goal #1 - Progress: Goal set today  Visit Information  Last OT Received On: 03/19/12 Assistance Needed: +2 PT/OT Co-Evaluation/Treatment: Yes    Subjective Data  Subjective: "I've been using the sock aid. I really want to use those crutches; that's what I'm used to" Patient Stated Goal: rehab then home   Prior Functioning  Vision/Perception  Home Living Lives With: Alone Prior Function Level of Independence: Needs assistance Communication Communication: No difficulties Dominant Hand: Right      Cognition  Overall Cognitive Status: Appears within functional limits for tasks assessed/performed Arousal/Alertness: Awake/alert Orientation Level: Appears intact for tasks  assessed Behavior During Session: [facility] for tasks performed    Extremity/Trunk Assessment Right Upper Extremity Assessment RUE ROM/Strength/Tone:  Within functional levels (reports problem with R shoulder:  can lift to 90) Left Upper Extremity Assessment LUE ROM/Strength/Tone: Within functional levels   Mobility Bed Mobility Bed Mobility: Supine to Sit Supine to Sit: 1: +2 Total assist;HOB elevated;With rails Supine to Sit: Patient Percentage: 60% Details for Bed Mobility Assistance: VCs safety, technique, hand placement. Assist for bil LEs off bed and trunk to upright. Heavy reliance on trapeze.  Transfers Sit to Stand: 1: +2 Total assist;From bed;With upper extremity assist;From elevated surface Sit to Stand: Patient Percentage: 60% Stand to Sit: 1: +2 Total assist;To chair/3-in-1;With upper extremity assist;With armrests Stand to Sit: Patient Percentage: 60% Details for Transfer Assistance: VCs safety, technique, hand placement. Assist to rise, stabilize, control descent.    Exercise    Balance    End of Session OT - End of Session Activity Tolerance: Patient limited by fatigue Patient left: in chair;with call bell/phone within reach  GO     Prescilla Monger 03/19/2012, 12:03 PM Marica Otter, OTR/L [phone] 03/19/2012

## 2012-03-20 LAB — CBC
HCT: 30.8 % — ABNORMAL LOW (ref 39.0–52.0)
Hemoglobin: 10.1 g/dL — ABNORMAL LOW (ref 13.0–17.0)
MCH: 30.1 pg (ref 26.0–34.0)
MCHC: 32.8 g/dL (ref 30.0–36.0)
MCV: 91.9 fL (ref 78.0–100.0)
Platelets: 133 10*3/uL — ABNORMAL LOW (ref 150–400)
RBC: 3.35 MIL/uL — ABNORMAL LOW (ref 4.22–5.81)
RDW: 13.5 % (ref 11.5–15.5)
WBC: 6.9 10*3/uL (ref 4.0–10.5)

## 2012-03-20 NOTE — Progress Notes (Signed)
Clinical Social Work Department CLINICAL SOCIAL WORK PLACEMENT NOTE 03/20/2012  Patient:  [name], [name]  Account Number:  [id number] Admit date:  03/17/2012  Clinical Social Worker:  Dellie Burns, Theresia Majors  Date/time:  03/18/2012 03:00 PM  Clinical Social Work is seeking post-discharge placement for this patient at the following level of care:   SKILLED NURSING   (*CSW will update this form in Epic as items are completed)   03/18/2012  Patient/family provided with [facility] Department of Clinical Social Work's list of facilities offering this level of care within the geographic area requested by the patient (or if unable, by the patient's family).  03/18/2012  Patient/family informed of their freedom to choose among providers that offer the needed level of care, that participate in Medicare, Medicaid or managed care program needed by the patient, have an available bed and are willing to accept the patient.  03/18/2012  Patient/family informed of [facility]' ownership interest in [facility], as well as of the fact that they are under no obligation to receive care at this facility.  PASARR submitted to EDS on 03/18/2012 PASARR number received from EDS on 03/18/2012  FL2 transmitted to all facilities in geographic area requested by pt/family on  03/18/2012 FL2 transmitted to all facilities within larger geographic area on 03/18/2012  Patient informed that his/her managed care company has contracts with or will negotiate with  certain facilities, including the following:     Patient/family informed of bed offers received:  03/18/2012 Patient chooses bed at [facility] Physician recommends and patient chooses bed at    Patient to be transferred to [facility] on  03/20/2012 Patient to be transferred to facility by [facility]  The following physician request were entered in Epic:   Additional Comments:  Leron Croak, Leeroy Bock Long  Weekend Coverage [phone]

## 2012-03-20 NOTE — Progress Notes (Signed)
Discharged from floor via stretcher, accompanied by EMS. No changed in assessment. Shandi Godfrey, [employer]

## 2012-03-20 NOTE — Progress Notes (Signed)
Pt to be d/c today to [facility].  Pt and family agreeable. Confirmed plans with facility.  Plan transfer via EMS.   Nurse notified. Pt will be taking his own C-Pap machine to facility.   Leron Croak, LCSWA [employer] [phone]

## 2012-03-20 NOTE — Progress Notes (Signed)
Orthopedics Progress Note  Subjective: Pt resting comfortably in no acute distress C/o mild pain when awake to right hip  Objective:  Filed Vitals:   03/20/12 0621  BP: 120/64  Pulse: 80  Temp: 98.7 F (37.1 C)  Resp: 16    General: Awake and alert  Musculoskeletal: right hip incision healing well, moderate pedal edema distally bilaterally, no drainage or erythema Neurovascularly intact  Lab Results  Component Value Date   WBC 6.9 03/20/2012   HGB 10.1* 03/20/2012   HCT 30.8* 03/20/2012   MCV 91.9 03/20/2012   PLT 133* 03/20/2012       Component Value Date/Time   NA 136 03/19/2012 0428   K 3.6 03/19/2012 0428   CL 100 03/19/2012 0428   CO2 29 03/19/2012 0428   GLUCOSE 111* 03/19/2012 0428   BUN 12 03/19/2012 0428   CREATININE 1.03 03/19/2012 0428   CALCIUM 8.5 03/19/2012 0428   GFRNONAA 74* 03/19/2012 0428   GFRAA 86* 03/19/2012 0428    Lab Results  Component Value Date   INR 1.07 03/11/2012    Assessment/Plan: POD # s/p Procedure(s):right ACETABULAR REVISION  Plan to d/c to snf today F/u in 2 weeks   Viviann Spare R. Ranell Patrick, MD 03/20/2012 6:58 AM

## 2012-03-22 NOTE — Care Management Note (Signed)
    Page 1 of 2   03/22/2012     11:22:03 AM   CARE MANAGEMENT NOTE 03/22/2012  Patient:  [name], [name]   Account Number:  [id number]  Date Initiated:  03/18/2012  Documentation initiated by:  Colleen Can  Subjective/Objective Assessment:   dx rt hip revision     Action/Plan:   SNF rehab   Anticipated DC Date:  03/20/2012   Anticipated DC Plan:  SKILLED NURSING FACILITY  In-house referral  Clinical Social Worker      DC Planning Services  NA      [facility] Choice  NA   Choice offered to / List presented to:  NA   DME arranged  NA      DME agency  NA     HH arranged  NA      HH agency  NA   Status of service:  Completed, signed off Medicare Important Message given?  NA - LOS <3 / Initial given by admissions (If response is "NO", the following Medicare IM given date fields will be blank) Date Medicare IM given:   Date Additional Medicare IM given:    Discharge Disposition:  SKILLED NURSING FACILITY  Per UR Regulation:    If discussed at Long Length of Stay Meetings, dates discussed:    Comments:

## 2012-07-06 ENCOUNTER — Ambulatory Visit (HOSPITAL_COMMUNITY)
Admission: RE | Admit: 2012-07-06 | Discharge: 2012-07-06 | Disposition: A | Discharge status: Home / Self Care | Payer: Medicare Other | Source: Ambulatory Visit | Attending: Gastroenterology | Admitting: Gastroenterology

## 2012-07-06 ENCOUNTER — Encounter (HOSPITAL_COMMUNITY): Payer: Self-pay | Admitting: *Deleted

## 2012-07-06 ENCOUNTER — Encounter (HOSPITAL_COMMUNITY): Payer: Self-pay | Admitting: Gastroenterology

## 2012-07-06 ENCOUNTER — Encounter (HOSPITAL_COMMUNITY)
Admission: RE | Disposition: A | Discharge status: Home / Self Care | Payer: Self-pay | Source: Ambulatory Visit | Attending: Gastroenterology

## 2012-07-06 ENCOUNTER — Ambulatory Visit (HOSPITAL_COMMUNITY): Payer: Medicare Other | Admitting: *Deleted

## 2012-07-06 DIAGNOSIS — R195 Other fecal abnormalities: Secondary | ICD-10-CM | POA: Insufficient documentation

## 2012-07-06 DIAGNOSIS — K219 Gastro-esophageal reflux disease without esophagitis: Secondary | ICD-10-CM | POA: Insufficient documentation

## 2012-07-06 DIAGNOSIS — K449 Diaphragmatic hernia without obstruction or gangrene: Secondary | ICD-10-CM | POA: Insufficient documentation

## 2012-07-06 HISTORY — PX: ESOPHAGOGASTRODUODENOSCOPY (EGD) WITH PROPOFOL: SHX5813

## 2012-07-06 SURGERY — ESOPHAGOGASTRODUODENOSCOPY (EGD) WITH PROPOFOL
Anesthesia: Monitor Anesthesia Care

## 2012-07-06 MED ORDER — KETAMINE HCL 10 MG/ML IJ SOLN
INTRAMUSCULAR | Status: DC | PRN
  Administered 2012-07-06: 10 mg via INTRAVENOUS

## 2012-07-06 MED ORDER — PROPOFOL 10 MG/ML IV EMUL: INTRAVENOUS | Status: DC | PRN

## 2012-07-06 MED ORDER — SODIUM CHLORIDE 0.9 % IV SOLN: INTRAVENOUS | Status: DC

## 2012-07-06 MED ORDER — PROPOFOL 10 MG/ML IV EMUL
INTRAVENOUS | Status: DC | PRN
  Administered 2012-07-06: 180 ug/kg/min via INTRAVENOUS

## 2012-07-06 MED ORDER — FENTANYL CITRATE 0.05 MG/ML IJ SOLN
INTRAMUSCULAR | Status: DC | PRN
  Administered 2012-07-06: 50 ug via INTRAVENOUS

## 2012-07-06 MED ORDER — MIDAZOLAM HCL 5 MG/5ML IJ SOLN
INTRAMUSCULAR | Status: DC | PRN
  Administered 2012-07-06 (×2): 1 mg via INTRAVENOUS

## 2012-07-06 MED ORDER — LACTATED RINGERS IV SOLN
INTRAVENOUS | Status: DC
  Administered 2012-07-06: 1000 mL via INTRAVENOUS

## 2012-07-06 MED ORDER — LACTATED RINGERS IV SOLN
INTRAVENOUS | Status: DC | PRN
  Administered 2012-07-06: 11:00:00 via INTRAVENOUS

## 2012-07-06 SURGICAL SUPPLY — 14 items

## 2012-07-06 NOTE — Progress Notes (Signed)
[name] 10:35 AM  Subjective: Patient has no new medical problems since we saw him in the office and has not seen any more blood and has no new complaints  Objective: Vital signs stable afebrile no acute distress exam please see pre-assessment  Assessment: Upper tract symptoms guaiac positive history of stomach stapling multiple medical problems  Plan: Okay to proceed with propofol and endoscopy today  [name] E

## 2012-07-06 NOTE — Anesthesia Preprocedure Evaluation (Addendum)
Anesthesia Evaluation  Patient identified by MRN, date of birth, ID band Patient awake    Reviewed: Allergy & Precautions, H&P , NPO status , Patient's Chart, lab work & pertinent test results  Airway Mallampati: III TM Distance: >3 FB Neck ROM: full    Dental No notable dental hx. (+) Teeth Intact and Dental Advisory Given   Pulmonary neg pulmonary ROS, sleep apnea and Continuous Positive Airway Pressure Ventilation ,  breath sounds clear to auscultation  Pulmonary exam normal       Cardiovascular Exercise Tolerance: Good hypertension, Pt. on medications negative cardio ROS  Rhythm:regular Rate:Normal     Neuro/Psych negative neurological ROS  negative psych ROS   GI/Hepatic negative GI ROS, Neg liver ROS,   Endo/Other  negative endocrine ROSMorbid obesity  Renal/GU negative Renal ROS  negative genitourinary   Musculoskeletal   Abdominal (+) + obese,   Peds  Hematology negative hematology ROS (+)   Anesthesia Other Findings   Reproductive/Obstetrics negative OB ROS                          Anesthesia Physical Anesthesia Plan  ASA: III  Anesthesia Plan: MAC   Post-op Pain Management:    Induction:   Airway Management Planned:   Additional Equipment:   Intra-op Plan:   Post-operative Plan:   Informed Consent: I have reviewed the patients History and Physical, chart, labs and discussed the procedure including the risks, benefits and alternatives for the proposed anesthesia with the patient or authorized representative who has indicated his/her understanding and acceptance.   Dental Advisory Given  Plan Discussed with: CRNA and Surgeon  Anesthesia Plan Comments:         Anesthesia Quick Evaluation

## 2012-07-06 NOTE — Op Note (Signed)
[facility] [address] [place], [address]   ENDOSCOPY PROCEDURE REPORT  PATIENT: [name], [name]  MR#: [id number] BIRTHDATE: [birth year] , 66  yrs. old GENDER: Male  ENDOSCOPIST: Vida Rigger, MD REFERRED IO:[id number] Lysbeth Galas, M.D.  PROCEDURE DATE:  07/06/2012 PROCEDURE:   EGD, diagnostic ASA CLASS:   Class III INDICATIONS:occult blood positive and history of esophageal reflux.   MEDICATIONS: Propofol (Diprivan) 210 mg, Fentanyl-Detailed 50 mcg, and Versed 2 mg IV  TOPICAL ANESTHETIC:used  DESCRIPTION OF PROCEDURE:   After the risks benefits and alternatives of the procedure were thoroughly explained, informed consent was obtained.  The Pentax Gastroscope [id number] and Pentax [id number] peds [id number]  endoscope was introduced through the mouth and advanced to the second portion of the duodenum , limited by Without limitations.   The instrument was slowly withdrawn as the mucosa was fully examined.initially we used the regular endoscope but could not get past either of the seemingly two anastomosis from his gastric staplingbut his gastric pouch and esophagus are normal except for a very tiny hiatal hernia and we exchanged the regular scope for the pediatric endoscope and could easily get through the distal anastomosis into the distal stomach which had minimal gastric and advanced through the normal pylorus into a normal duodenal bulb and second portion of the duodenum an adequate look at the ampulla was normal as well no additional findings were seen of the distal stomach on straight and retroflexed visuals and we withdrew back to the gastric pouch but could not advance past the proximal anastomosis due to increased looping and after a prolonged effort air was suctioned and the scope was withdrawn and the patient tolerated the procedure well there was no obvious immediate complication      FINDINGS:1. Tiny hiatal hernia only 2. Unable to advance the  regular scope passed the anastomosis 3 . Exchange scopes for the pediatric and able to evaluate distal stomach and upper duodenum with minimal gastritis only 4.unable to advance past proximalfundic anastomosis due to looping 5. Otherwise within normal limits EGD  COMPLICATIONS: none  ENDOSCOPIC IMPRESSION: above   RECOMMENDATIONS:continue pump inhibitors happy to see back when necessary return care of to primary Dr.  Amado Coe: as needed   _______________________________ Vida Rigger, MD    CC:  PATIENT NAME:  [name], [name] MR#: [id number]

## 2012-07-07 ENCOUNTER — Encounter (HOSPITAL_COMMUNITY): Payer: Self-pay | Admitting: Gastroenterology

## 2012-07-08 NOTE — Anesthesia Postprocedure Evaluation (Signed)
  Anesthesia Post-op Note  Patient: [name]  Procedure(s) Performed: Procedure(s) (LRB): ESOPHAGOGASTRODUODENOSCOPY (EGD) WITH PROPOFOL (N/A)  Patient Location: PACU  Anesthesia:  MAC  Level of Consciousness: awake and alert   Airway and Oxygen Therapy: Patient Spontanous Breathing  Post-op Pain: mild  Post-op Assessment: Post-op Vital signs reviewed, Patient's Cardiovascular Status Stable, Respiratory Function Stable, Patent Airway and No signs of Nausea or vomiting  Post-op Vital Signs: stable  Complications: No apparent anesthesia complications

## 2012-07-08 NOTE — Transfer of Care (Signed)
Immediate Anesthesia Transfer of Care Note  Patient: [name]  Procedure(s) Performed: Procedure(s) (LRB) with comments: ESOPHAGOGASTRODUODENOSCOPY (EGD) WITH PROPOFOL (N/A)  Patient Location: PACU and Endoscopy Unit  Anesthesia Type:MAC  Level of Consciousness: awake  Airway & Oxygen Therapy: Patient Spontanous Breathing  Post-op Assessment: Report given to PACU RN  Post vital signs: stable  Complications: No apparent anesthesia complications

## 2012-09-06 ENCOUNTER — Emergency Department (HOSPITAL_COMMUNITY)
Admission: EM | Admit: 2012-09-06 | Discharge: 2012-09-06 | Disposition: A | Discharge status: Home / Self Care | Payer: Medicare Other | Attending: Emergency Medicine | Admitting: Emergency Medicine

## 2012-09-06 ENCOUNTER — Encounter (HOSPITAL_COMMUNITY): Payer: Self-pay | Admitting: Emergency Medicine

## 2012-09-06 ENCOUNTER — Emergency Department (HOSPITAL_COMMUNITY): Payer: Medicare Other

## 2012-09-06 DIAGNOSIS — G4700 Insomnia, unspecified: Secondary | ICD-10-CM | POA: Insufficient documentation

## 2012-09-06 DIAGNOSIS — Z8679 Personal history of other diseases of the circulatory system: Secondary | ICD-10-CM | POA: Insufficient documentation

## 2012-09-06 DIAGNOSIS — G4730 Sleep apnea, unspecified: Secondary | ICD-10-CM | POA: Insufficient documentation

## 2012-09-06 DIAGNOSIS — I10 Essential (primary) hypertension: Secondary | ICD-10-CM | POA: Insufficient documentation

## 2012-09-06 DIAGNOSIS — Z9989 Dependence on other enabling machines and devices: Secondary | ICD-10-CM | POA: Insufficient documentation

## 2012-09-06 DIAGNOSIS — R112 Nausea with vomiting, unspecified: Secondary | ICD-10-CM | POA: Insufficient documentation

## 2012-09-06 DIAGNOSIS — M129 Arthropathy, unspecified: Secondary | ICD-10-CM | POA: Insufficient documentation

## 2012-09-06 DIAGNOSIS — F3289 Other specified depressive episodes: Secondary | ICD-10-CM | POA: Insufficient documentation

## 2012-09-06 DIAGNOSIS — K219 Gastro-esophageal reflux disease without esophagitis: Secondary | ICD-10-CM | POA: Insufficient documentation

## 2012-09-06 DIAGNOSIS — R109 Unspecified abdominal pain: Secondary | ICD-10-CM | POA: Insufficient documentation

## 2012-09-06 DIAGNOSIS — Z8719 Personal history of other diseases of the digestive system: Secondary | ICD-10-CM | POA: Insufficient documentation

## 2012-09-06 DIAGNOSIS — F329 Major depressive disorder, single episode, unspecified: Secondary | ICD-10-CM | POA: Insufficient documentation

## 2012-09-06 DIAGNOSIS — Z9884 Bariatric surgery status: Secondary | ICD-10-CM | POA: Insufficient documentation

## 2012-09-06 DIAGNOSIS — Z87891 Personal history of nicotine dependence: Secondary | ICD-10-CM | POA: Insufficient documentation

## 2012-09-06 DIAGNOSIS — Z79899 Other long term (current) drug therapy: Secondary | ICD-10-CM | POA: Insufficient documentation

## 2012-09-06 LAB — URINALYSIS, ROUTINE W REFLEX MICROSCOPIC
Bilirubin Urine: NEGATIVE
Glucose, UA: 100 mg/dL — AB
Ketones, ur: NEGATIVE mg/dL
Leukocytes, UA: NEGATIVE
Nitrite: NEGATIVE
Protein, ur: NEGATIVE mg/dL
Specific Gravity, Urine: 1.02 (ref 1.005–1.030)
Urobilinogen, UA: 0.2 mg/dL (ref 0.0–1.0)
pH: 6.5 (ref 5.0–8.0)

## 2012-09-06 LAB — BASIC METABOLIC PANEL
BUN: 19 mg/dL (ref 6–23)
CO2: 26 mEq/L (ref 19–32)
Calcium: 8.9 mg/dL (ref 8.4–10.5)
Chloride: 104 mEq/L (ref 96–112)
Creatinine, Ser: 1.02 mg/dL (ref 0.50–1.35)
GFR calc Af Amer: 86 mL/min — ABNORMAL LOW (ref >90)
GFR calc non Af Amer: 75 mL/min — ABNORMAL LOW (ref >90)
Glucose, Bld: 146 mg/dL — ABNORMAL HIGH (ref 70–99)
Potassium: 4.3 mEq/L (ref 3.5–5.1)
Sodium: 136 mEq/L (ref 135–145)

## 2012-09-06 LAB — CBC WITH DIFFERENTIAL/PLATELET
Basophils Absolute: 0 10*3/uL (ref 0.0–0.1)
Basophils Relative: 0 % (ref 0–1)
Eosinophils Absolute: 0 10*3/uL (ref 0.0–0.7)
Eosinophils Relative: 0 % (ref 0–5)
HCT: 38.5 % — ABNORMAL LOW (ref 39.0–52.0)
Hemoglobin: 12.5 g/dL — ABNORMAL LOW (ref 13.0–17.0)
Lymphocytes Relative: 8 % — ABNORMAL LOW (ref 12–46)
Lymphs Abs: 0.5 10*3/uL — ABNORMAL LOW (ref 0.7–4.0)
MCH: 29 pg (ref 26.0–34.0)
MCHC: 32.5 g/dL (ref 30.0–36.0)
MCV: 89.3 fL (ref 78.0–100.0)
Monocytes Absolute: 0.4 10*3/uL (ref 0.1–1.0)
Monocytes Relative: 7 % (ref 3–12)
Neutro Abs: 5.5 10*3/uL (ref 1.7–7.7)
Neutrophils Relative %: 85 % — ABNORMAL HIGH (ref 43–77)
Platelets: 153 10*3/uL (ref 150–400)
RBC: 4.31 MIL/uL (ref 4.22–5.81)
RDW: 14.2 % (ref 11.5–15.5)
WBC: 6.5 10*3/uL (ref 4.0–10.5)

## 2012-09-06 LAB — URINE MICROSCOPIC-ADD ON

## 2012-09-06 MED ORDER — ONDANSETRON HCL 4 MG PO TABS: 4.0000 mg | ORAL_TABLET | Freq: Four times a day (QID) | ORAL | Status: DC

## 2012-09-06 MED ORDER — MORPHINE SULFATE 4 MG/ML IJ SOLN
2.0000 mg | Freq: Once | INTRAMUSCULAR | Status: AC
  Administered 2012-09-06: 2 mg via INTRAVENOUS
  Filled 2012-09-06: qty 1

## 2012-09-06 MED ORDER — SODIUM CHLORIDE 0.9 % IV SOLN
Freq: Once | INTRAVENOUS | Status: AC
  Administered 2012-09-06: 1000 mL via INTRAVENOUS

## 2012-09-06 NOTE — ED Provider Notes (Signed)
History     CSN: [id number]  Arrival date & time 09/06/12  0136   First MD Initiated Contact with Patient 09/06/12 0205      Chief Complaint  Patient presents with  . Abdominal Pain  . Emesis    (Consider location/radiation/quality/duration/timing/severity/associated sxs/prior treatment) HPI [name] is a 67 y.o. male brought in by ambulance, who presents to the Emergency Department complaining of mid abdominal pain that began at 1900. Pain is located at site of mesh and palpable scar tissue to mid abdomen. Associated with nausea and several episodes of vomiting. Unable to keep down fluids. Denies diarrhea. He took MOM but was unable to keep it down.  PCP Dr. Lysbeth Galas GI Dr, Ewing Schlein Past Medical History  Diagnosis Date  . Hypertension   . Depression   . GERD (gastroesophageal reflux disease)   . History of blood transfusion   . Hemorrhoids   . Insomnia   . Anginal pain 2006    evaluated by cardio  . Sleep apnea     cpap  . Arthritis     knees,feet,shoulders,elbows.hands    Past Surgical History  Procedure Date  . Colonoscopy w/ polypectomy   . Esophagoscopy   . Vertical banded gastroplasty   . Eye surgery     left eye- muscle repair  . Appendectomy   . Hernia repair     ventral hernia  . Cholecystectomy   . Joint replacement     bilateral hips  . Esophagogastroduodenoscopy (egd) with propofol 07/06/2012    Procedure: ESOPHAGOGASTRODUODENOSCOPY (EGD) WITH PROPOFOL;  Surgeon: Petra Kuba, MD;  Location: [facility] ENDOSCOPY;  Service: Endoscopy;  Laterality: N/A;    No family history on file.  History  Substance Use Topics  . Smoking status: Former Smoker -- 2.0 packs/day for 10 years    Types: Cigarettes    Quit date: 03/11/1984  . Smokeless tobacco: Never Used  . Alcohol Use: Yes      Review of Systems  Constitutional: Negative for fever.       10 Systems reviewed and are negative for acute change except as noted in the HPI.  HENT: Negative for  congestion.   Eyes: Negative for discharge and redness.  Respiratory: Negative for cough and shortness of breath.   Cardiovascular: Negative for chest pain.  Gastrointestinal: Positive for nausea, vomiting and abdominal pain.  Musculoskeletal: Negative for back pain.  Skin: Negative for rash.  Neurological: Negative for syncope, numbness and headaches.  Psychiatric/Behavioral:       No behavior change.    Allergies  Review of patient's allergies indicates no known allergies.  Home Medications   Current Outpatient Rx  Name  Route  Sig  Dispense  Refill  . AMLODIPINE BESYLATE 5 MG PO TABS   Oral   Take 5 mg by mouth daily with breakfast.         . CALCIUM CARBONATE ANTACID 500 MG PO CHEW   Oral   Chew 1 tablet (200 mg of elemental calcium total) by mouth every 6 (six) hours as needed.   60 tablet   0   . CYCLOBENZAPRINE HCL 10 MG PO TABS   Oral   Take 10 mg by mouth 2 (two) times daily. Muscle spasm         . HYPROMELLOSE 2.5 % OP SOLN   Both Eyes   Place 1 drop into both eyes 3 (three) times daily as needed. Dry eyes         .  LOSARTAN POTASSIUM-HCTZ 50-12.5 MG PO TABS   Oral   Take 1 tablet by mouth daily with breakfast.         . MELOXICAM 7.5 MG PO TABS   Oral   Take 7.5 mg by mouth daily.         Marland Kitchen METHADONE HCL 10 MG PO TABS   Oral   Take 20 mg by mouth every 6 (six) hours.          . OMEPRAZOLE 40 MG PO CPDR   Oral   Take 40 mg by mouth daily.         Marland Kitchen METAMUCIL PO   Oral   Take by mouth. 3-4 scoops daily         . VENLAFAXINE HCL ER 150 MG PO CP24   Oral   Take 300 mg by mouth daily with breakfast.         . ACETAMINOPHEN 325 MG PO TABS   Oral   Take 2 tablets (650 mg total) by mouth every 6 (six) hours as needed (or Fever >/= 101).   60 tablet   0   . CELECOXIB 200 MG PO CAPS   Oral   Take 200 mg by mouth 2 (two) times daily.         Marland Kitchen RIVAROXABAN 10 MG PO TABS   Oral   Take 1 tablet (10 mg total) by mouth daily with  breakfast. Take Xarelto for two and a half more weeks, then discontinue Xarelto.   18 tablet   0     BP 108/76  Pulse 89  Temp 98.7 F (37.1 C) (Oral)  Resp 18  Ht 5\' 9"  (1.753 m)  Wt 385 lb (174.635 kg)  BMI 56.85 kg/m2  SpO2 96%  Physical Exam  Nursing note and vitals reviewed. Constitutional:       Morbildly obese Awake, alert, nontoxic appearance.  HENT:  Head: Atraumatic.  Eyes: Right eye exhibits no discharge. Left eye exhibits no discharge.  Neck: Neck supple.  Cardiovascular: Normal heart sounds.   Pulmonary/Chest: Effort normal and breath sounds normal. He exhibits no tenderness.  Abdominal: Soft. There is tenderness. There is no rebound.       Bowel sounds in all 4 quadrants. Hard rounded structure in the mid abdomen patient states is a "cyst" behind the mesh. His pain is focused across the mid abdomen. No rebound, no guarding.   Musculoskeletal: He exhibits no tenderness.       Baseline ROM, no obvious new focal weakness.  Neurological:       Mental status and motor strength appears baseline for patient and situation.  Skin: No rash noted.  Psychiatric: He has a normal mood and affect.    ED Course  Procedures (including critical care time) Results for orders placed during the hospital encounter of 09/06/12  URINALYSIS, ROUTINE W REFLEX MICROSCOPIC      Component Value Range   Color, Urine YELLOW  YELLOW   APPearance CLEAR  CLEAR   Specific Gravity, Urine 1.020  1.005 - 1.030   pH 6.5  5.0 - 8.0   Glucose, UA 100 (*) NEGATIVE mg/dL   Hgb urine dipstick TRACE (*) NEGATIVE   Bilirubin Urine NEGATIVE  NEGATIVE   Ketones, ur NEGATIVE  NEGATIVE mg/dL   Protein, ur NEGATIVE  NEGATIVE mg/dL   Urobilinogen, UA 0.2  0.0 - 1.0 mg/dL   Nitrite NEGATIVE  NEGATIVE   Leukocytes, UA NEGATIVE  NEGATIVE  CBC WITH DIFFERENTIAL  Component Value Range   WBC 6.5  4.0 - 10.5 K/uL   RBC 4.31  4.22 - 5.81 MIL/uL   Hemoglobin 12.5 (*) 13.0 - 17.0 g/dL   HCT 02.7 (*)  25.3 - 52.0 %   MCV 89.3  78.0 - 100.0 fL   MCH 29.0  26.0 - 34.0 pg   MCHC 32.5  30.0 - 36.0 g/dL   RDW 66.4  40.3 - 47.4 %   Platelets 153  150 - 400 K/uL   Neutrophils Relative 85 (*) 43 - 77 %   Neutro Abs 5.5  1.7 - 7.7 K/uL   Lymphocytes Relative 8 (*) 12 - 46 %   Lymphs Abs 0.5 (*) 0.7 - 4.0 K/uL   Monocytes Relative 7  3 - 12 %   Monocytes Absolute 0.4  0.1 - 1.0 K/uL   Eosinophils Relative 0  0 - 5 %   Eosinophils Absolute 0.0  0.0 - 0.7 K/uL   Basophils Relative 0  0 - 1 %   Basophils Absolute 0.0  0.0 - 0.1 K/uL  BASIC METABOLIC PANEL      Component Value Range   Sodium 136  135 - 145 mEq/L   Potassium 4.3  3.5 - 5.1 mEq/L   Chloride 104  96 - 112 mEq/L   CO2 26  19 - 32 mEq/L   Glucose, Bld 146 (*) 70 - 99 mg/dL   BUN 19  6 - 23 mg/dL   Creatinine, Ser 2.59  0.50 - 1.35 mg/dL   Calcium 8.9  8.4 - 56.3 mg/dL   GFR calc non Af Amer 75 (*) >90 mL/min   GFR calc Af Amer 86 (*) >90 mL/min  URINE MICROSCOPIC-ADD ON      Component Value Range   Squamous Epithelial / LPF RARE  RARE   RBC / HPF 0-2  <3 RBC/hpf    Dg Abd Acute W/chest  09/06/2012  *RADIOLOGY REPORT*  Clinical Data: Nausea vomiting.  Epigastric pain.  ACUTE ABDOMEN SERIES (ABDOMEN 2 VIEW & CHEST 1 VIEW)  Comparison: PA and lateral chest 03/11/2012 and 09/12/2004.  Findings: Single view of the chest demonstrates streaky opacities in the left mid lung and left lung base.  Right lung is clear. Heart size upper normal.  No pneumothorax or pleural effusion.  Two views of the abdomen show no free intraperitoneal air.  There is gaseous distention of the transverse colon.  A few gas filled but nondilated loops of small bowel are identified.  IMPRESSION:  1.  Streaky left mid lung and left basilar opacities likely due to atelectasis or scar rather than pneumonia. 2.  Nonspecific, nonobstructive bowel gas pattern.   Original Report Authenticated By: Holley Dexter, M.D.      0400 Patient has been up to the bathroom.  Now with diarrhea. Pain and nausea resolved with medication.  MDM  Patient presents with mid abdominal pain, nausea, vomiting that began at 1900. Given antiemetic, analgesic with relief.Labs unremarkable. Xrays with gaseous distention of the transverse colon and a few gas filled but non dilated loops of small bowel.Picture is c/w viral gastroenteritis.He developed diarrhea while in the ER. Pt stable in ED with no significant deterioration in condition.The patient appears reasonably screened and/or stabilized for discharge and I doubt any other medical condition or other [facility] requiring further screening, evaluation, or treatment in the ED at this time prior to discharge.  MDM Reviewed: nursing note and vitals Interpretation: labs and x-ray  Nicoletta Dress. Colon Branch, MD 09/06/12 [phone]

## 2012-09-06 NOTE — ED Notes (Signed)
Patient awaiting ride home.  Sitting up in bed using his phone.

## 2012-09-06 NOTE — ED Notes (Signed)
Up to bathroom, had a large "bordering on diarrhea" stool. Feels better, pain decreased to 1/10 and nausea gone

## 2012-09-06 NOTE — ED Notes (Signed)
Patient presents to ER via [facility] with c/o center epigastric pain with associated nausea and vomiting.  Patient was given Zofran 4mg  IV en route.

## 2012-12-06 ENCOUNTER — Encounter: Payer: Self-pay | Admitting: Neurology

## 2012-12-06 ENCOUNTER — Ambulatory Visit (INDEPENDENT_AMBULATORY_CARE_PROVIDER_SITE_OTHER): Payer: Medicare Other | Admitting: Neurology

## 2012-12-06 VITALS — BP 140/80 | HR 76 | Temp 99.0°F | Ht 69.0 in | Wt 347.0 lb

## 2012-12-06 DIAGNOSIS — R269 Unspecified abnormalities of gait and mobility: Secondary | ICD-10-CM

## 2012-12-06 DIAGNOSIS — Z9181 History of falling: Secondary | ICD-10-CM

## 2012-12-06 DIAGNOSIS — R296 Repeated falls: Secondary | ICD-10-CM

## 2012-12-06 NOTE — Patient Instructions (Addendum)
I want to suggest a few things today: I would like to do an MRI brain and refer you to physical therapy. Stand up from the seated position or from the lying position very slowly as your blood pressure drops with standing. This can cause you for faint.   Remember to drink plenty of fluid, eat healthy meals and do not skip any meals. Try to eat protein with a every meal and eat a healthy snack such as fruit or nuts in between meals. Try to keep a regular sleep-wake schedule and try to exercise daily, particularly in the form of walking, 20-30 minutes a day, if you can.   Engage in social activities in your community and with your family and try to keep up with current events by reading the newspaper or watching the news.   As far as your medications are concerned, I would like to suggest no changes.  For your safety, since you live alone, consider a Psychologist, educational.   I would like to see you back in 3 months, sooner if we need to. Please call us with any interim questions, concerns, problems, updates or refill requests.  Please also call us for any test results so we can go over those with you on the phone. Brett Canales is my clinical assistant and will answer any of your questions and relay your messages to me and also relay most of my messages to you.  Our phone number is [phone]. We also have an after hours call service for urgent matters and there is a physician on-call for urgent questions. For any emergencies you know to call 911 or go to the nearest emergency room.
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
## 2012-12-21 ENCOUNTER — Other Ambulatory Visit: Payer: Self-pay | Admitting: Neurology

## 2012-12-21 ENCOUNTER — Ambulatory Visit
Admission: RE | Admit: 2012-12-21 | Discharge: 2012-12-21 | Disposition: A | Discharge status: Home / Self Care | Payer: Medicare Other | Source: Ambulatory Visit | Attending: Neurology | Admitting: Neurology

## 2012-12-21 DIAGNOSIS — IMO0002 Reserved for concepts with insufficient information to code with codable children: Secondary | ICD-10-CM

## 2012-12-21 DIAGNOSIS — R269 Unspecified abnormalities of gait and mobility: Secondary | ICD-10-CM

## 2012-12-21 DIAGNOSIS — R296 Repeated falls: Secondary | ICD-10-CM

## 2012-12-23 ENCOUNTER — Other Ambulatory Visit: Payer: Self-pay | Admitting: Neurology

## 2012-12-23 DIAGNOSIS — R9089 Other abnormal findings on diagnostic imaging of central nervous system: Secondary | ICD-10-CM

## 2012-12-23 NOTE — Progress Notes (Signed)
Quick Note:  Please call patient and advise him that his recent brain MRI without contrast showed no acute abnormalities but there are some white spots in his brain. These are nonspecific findings, and we can see these in patients who have vascular disease, migraine headaches, chronic hypertension, or just with advancing age. Nevertheless, since he has had a history of recurrent falls I would recommend that we go ahead and do another MRI, this time with contrast to make sure we were not missing anything that would explain his falls. If he is agreeable I will go ahead and order another MRI brain, this time with contrast administration. Please related to patient, thanks Huston Foley, MD, PhD [facility] ([facility]) ______

## 2012-12-24 ENCOUNTER — Telehealth: Payer: Self-pay | Admitting: *Deleted

## 2012-12-24 NOTE — Progress Notes (Signed)
Quick Note:  Spoke with patient and relayed the results of their MRI as well as Dr Teofilo Pod advise or recommendations. The patient would like to have the recommended second MRI done with contrast. ______

## 2012-12-24 NOTE — Telephone Encounter (Signed)
Left a message on the pt's home voice mail regarding his recent MRI.  Contact information was given so that he may return the call along with reassurance that findings were not critical but need to be discussed.

## 2012-12-24 NOTE — Telephone Encounter (Signed)
Spoke with patient and relayed results of his MRI.  Patient understood and had no questions.  He will be scheduling a 2nd MRI (w/contrast) as recommended by Dr Frances Furbish.

## 2013-01-10 ENCOUNTER — Ambulatory Visit
Admission: RE | Admit: 2013-01-10 | Discharge: 2013-01-10 | Disposition: A | Discharge status: Home / Self Care | Payer: Medicare Other | Source: Ambulatory Visit | Attending: Neurology | Admitting: Neurology

## 2013-01-10 DIAGNOSIS — R9089 Other abnormal findings on diagnostic imaging of central nervous system: Secondary | ICD-10-CM

## 2013-01-10 DIAGNOSIS — Z9181 History of falling: Secondary | ICD-10-CM

## 2013-01-10 MED ORDER — GADOBENATE DIMEGLUMINE 529 MG/ML IV SOLN
20.0000 mL | Freq: Once | INTRAVENOUS | Status: AC | PRN
  Administered 2013-01-10: 20 mL via INTRAVENOUS

## 2013-01-13 NOTE — Progress Notes (Signed)
Quick Note:  Please call patient and advise him that his recent brain MRI with contrast showed no new or acute abnormalities and there was no contrast enhancement. Again, nothing to worry about, no mass, no lesion, no stroke. There are nonspecific findings, and we can see these in patients who have vascular disease, migraine headaches, chronic hypertension, or just with advancing age. No further action is required with respect to his MRI brain. Huston Foley, MD, PhD [facility]  ______

## 2013-01-14 ENCOUNTER — Telehealth: Payer: Self-pay | Admitting: *Deleted

## 2013-01-14 NOTE — Telephone Encounter (Signed)
Message copied by Avie Echevaria on Fri Jan 14, 2013  4:07 PM ------      Message from: Huston Foley      Created: Thu Jan 13, 2013  5:16 PM       Please call patient and advise him that his recent brain MRI with contrast showed no new or acute abnormalities and there was no contrast enhancement. Again, nothing to worry about, no mass, no lesion, no stroke. There are nonspecific findings, and we can see these in patients who have vascular disease, migraine headaches, chronic hypertension, or just with advancing age. No further action is required with respect to his MRI brain.      Huston Foley, MD, PhD      [facility])       ------

## 2013-01-14 NOTE — Telephone Encounter (Signed)
Left a message on the pt's home voice mail requesting a return call regarding his recent MRI.

## 2013-01-18 ENCOUNTER — Telehealth: Payer: Self-pay | Admitting: *Deleted

## 2013-01-18 NOTE — Telephone Encounter (Signed)
Message copied by Avie Echevaria on Tue Jan 18, 2013  2:16 PM ------      Message from: Huston Foley      Created: Thu Jan 13, 2013  5:16 PM       Please call patient and advise him that his recent brain MRI with contrast showed no new or acute abnormalities and there was no contrast enhancement. Again, nothing to worry about, no mass, no lesion, no stroke. There are nonspecific findings, and we can see these in patients who have vascular disease, migraine headaches, chronic hypertension, or just with advancing age. No further action is required with respect to his MRI brain.      Huston Foley, MD, PhD      [facility])       ------

## 2013-01-18 NOTE — Telephone Encounter (Signed)
error 

## 2013-01-18 NOTE — Progress Notes (Signed)
Quick Note:  Spoke with patient and relayed the results of their MRI as well as Dr Athar's advise or recommendations. The patient was also reminded of any future appointments. The patient understood and had no questions.  ______ 

## 2013-03-07 ENCOUNTER — Ambulatory Visit: Payer: Medicare Other | Admitting: Neurology

## 2013-07-21 ENCOUNTER — Other Ambulatory Visit: Payer: Self-pay | Admitting: Dermatology

## 2014-03-31 ENCOUNTER — Encounter: Payer: Self-pay | Admitting: Cardiovascular Disease

## 2014-03-31 ENCOUNTER — Ambulatory Visit (INDEPENDENT_AMBULATORY_CARE_PROVIDER_SITE_OTHER): Payer: Medicare Other | Admitting: Cardiovascular Disease

## 2014-03-31 VITALS — BP 152/87 | HR 88 | Ht 69.0 in | Wt 324.0 lb

## 2014-03-31 DIAGNOSIS — R0602 Shortness of breath: Secondary | ICD-10-CM

## 2014-03-31 DIAGNOSIS — I10 Essential (primary) hypertension: Secondary | ICD-10-CM

## 2014-03-31 NOTE — Patient Instructions (Signed)
Your physician recommends that you schedule a follow-up appointment in: 2 months with DR. [facility]  Your physician recommends that you continue on your current medications as directed. Please refer to the Current Medication list given to you today.

## 2014-03-31 NOTE — Progress Notes (Signed)
Patient ID: [name], male   DOB: 1946/03/19, 68 y.o.   MRN: [id number]       CARDIOLOGY CONSULT NOTE  Patient ID: [name] MRN: [id number] DOB/AGE: [birth year] 68 y.o.  Admit date: (Not on file) Primary Physician Sherrie Mustache, MD  Reason for Consultation: SOB  HPI: The patient is a 68 year old male who is referred for shortness of breath. He has a history of hypertension, anemia and morbid obesity. ECG performed in the office today demonstrates normal sinus rhythm with a nonspecific T wave abnormality an RSR prime pattern in V1.  He tells me he had been experiencing shortness of breath when taking Lasix. However, since Lasix was discontinued and he was started on Hyzaar, he tells me his dyspnea on exertion has completely resolved. He says he has not seen his PCP since symptom resolution. He used to get short of breath when just standing up and would feel dizzy. He now says he can walk at least 2 blocks without having to stop. He denies chest tightness. He said he has a history of an arrhythmia which has since resolved. His blood pressure is 152/87 today but he initially went to the wrong office and felt rushed. He said his shortness of breath is made worse with eating. He does have sleep apnea and uses CPAP. He denies leg swelling and paroxysmal nocturnal dyspnea. He denies a history of heart disease.   Allergies  Allergen Reactions  . Bee Venom     Current Outpatient Prescriptions  Medication Sig Dispense Refill  . amLODipine (NORVASC) 5 MG tablet Take 5 mg by mouth daily with breakfast.      . celecoxib (CELEBREX) 200 MG capsule 2 tab am      . cyclobenzaprine (FLEXERIL) 10 MG tablet Take 10 mg by mouth as needed. Muscle spasm      . cycloSPORINE (RESTASIS) 0.05 % ophthalmic emulsion 1 drop as directed.      Marland Kitchen losartan-hydrochlorothiazide (HYZAAR) 50-12.5 MG per tablet Take 1 tablet by mouth daily with breakfast.      . meloxicam (MOBIC) 7.5 MG tablet Take 7.5 mg  by mouth daily.      . methadone (DOLOPHINE) 10 MG tablet Take 20 mg by mouth 3 (three) times daily.       . Psyllium (METAMUCIL PO) Take by mouth. 3-4 scoops daily      . venlafaxine XR (EFFEXOR-XR) 150 MG 24 hr capsule Take 300 mg by mouth daily with breakfast.       No current facility-administered medications for this visit.    Past Medical History  Diagnosis Date  . Hypertension   . Depression   . GERD (gastroesophageal reflux disease)   . History of blood transfusion   . Hemorrhoids   . Insomnia   . Anginal pain 2006    evaluated by cardio  . Sleep apnea     cpap  . Arthritis     knees,feet,shoulders,elbows.hands    Past Surgical History  Procedure Laterality Date  . Colonoscopy w/ polypectomy    . Esophagoscopy    . Vertical banded gastroplasty    . Eye surgery      left eye- muscle repair  . Appendectomy    . Hernia repair      ventral hernia  . Cholecystectomy    . Joint replacement      bilateral hips  . Esophagogastroduodenoscopy (egd) with propofol  07/06/2012    Procedure: ESOPHAGOGASTRODUODENOSCOPY (EGD) WITH PROPOFOL;  Surgeon: Caryl Bis  Magod, MD;  Location: [facility];  Service: Endoscopy;  Laterality: N/A;    History   Social History  . Marital Status: Divorced    Spouse Name: N/A    Number of Children: N/A  . Years of Education: N/A   Occupational History  . Not on file.   Social History Main Topics  . Smoking status: Former Smoker -- 2.00 packs/day for 10 years    Types: Cigarettes    Quit date: 03/11/1984  . Smokeless tobacco: Never Used  . Alcohol Use: Yes  . Drug Use: No  . Sexual Activity: Not on file   Other Topics Concern  . Not on file   Social History Narrative  . No narrative on file     No family history of premature CAD in 1st degree relatives.  Prior to Admission medications   Medication Sig Start Date End Date Taking? Authorizing Provider  amLODipine (NORVASC) 5 MG tablet Take 5 mg by mouth daily with breakfast.    Yes Historical Provider, MD  celecoxib (CELEBREX) 200 MG capsule 2 tab am   Yes Historical Provider, MD  cyclobenzaprine (FLEXERIL) 10 MG tablet Take 10 mg by mouth as needed. Muscle spasm   Yes Historical Provider, MD  cycloSPORINE (RESTASIS) 0.05 % ophthalmic emulsion 1 drop as directed.   Yes Historical Provider, MD  losartan-hydrochlorothiazide (HYZAAR) 50-12.5 MG per tablet Take 1 tablet by mouth daily with breakfast.   Yes Historical Provider, MD  meloxicam (MOBIC) 7.5 MG tablet Take 7.5 mg by mouth daily.   Yes Historical Provider, MD  methadone (DOLOPHINE) 10 MG tablet Take 20 mg by mouth 3 (three) times daily.    Yes Historical Provider, MD  Psyllium (METAMUCIL PO) Take by mouth. 3-4 scoops daily   Yes Historical Provider, MD  venlafaxine XR (EFFEXOR-XR) 150 MG 24 hr capsule Take 300 mg by mouth daily with breakfast.   Yes Historical Provider, MD     Review of systems complete and found to be negative unless listed above in HPI     Physical exam Blood pressure 152/87, pulse 88, height 5\' 9"  (1.753 m), weight 324 lb (146.965 kg). General: NAD, morbidly obese Neck: No JVD, no thyromegaly or thyroid nodule.  Lungs: Clear to auscultation bilaterally with normal respiratory effort. CV: Nondisplaced PMI. Regular rate and rhythm, normal S1/S2, no S3/S4, no murmur.  Minimal peripheral edema.  No carotid bruit.  Abdomen: Obese Skin: Intact without lesions or rashes.  Neurologic: Alert and oriented x 3.  Psych: Normal affect. Extremities: No clubbing or cyanosis.  HEENT: Normal.   Labs:   Lab Results  Component Value Date   WBC 6.5 09/06/2012   HGB 12.5* 09/06/2012   HCT 38.5* 09/06/2012   MCV 89.3 09/06/2012   PLT 153 09/06/2012   No results found for this basename: NA, K, CL, CO2, BUN, CREATININE, CALCIUM, LABALBU, PROT, BILITOT, ALKPHOS, ALT, AST, GLUCOSE,  in the last 168 hours No results found for this basename: CKTOTAL, CKMB, CKMBINDEX, TROPONINI    No results found for this  basename: CHOL   No results found for this basename: HDL   No results found for this basename: LDLCALC   No results found for this basename: TRIG   No results found for this basename: CHOLHDL   No results found for this basename: LDLDIRECT         Studies: No results found.  ASSESSMENT AND PLAN:  1. Dysnpea with exertion: This appears to have resolved with discontinuation of Lasix.  He now says he feels much better. For the time being, I will not proceed with noninvasive testing. I have informed him that should he have symptom recurrence, we would then pursue echocardiography and perhaps stress testing. I will followup with him in 2 months. Given that some of his symptoms were exacerbated with eating, I suspect he has gastroesophageal reflux disease.  2. Essential HTN: Well elevated in the office today, he initially went to the wrong office and was feeling stressed and rushed. I will continue to monitor this. He is on amlodipine 5 mg daily and Hyzaar 50-12.5 mg daily.  Dispo: f/u 2 months.  Signed: Kate Sable, M.D., F.A.C.C.  03/31/2014, 11:45 AM

## 2014-04-05 ENCOUNTER — Telehealth: Payer: Self-pay | Admitting: *Deleted

## 2014-04-05 NOTE — Telephone Encounter (Signed)
Care everywhere inquiry

## 2014-05-10 ENCOUNTER — Other Ambulatory Visit: Payer: Self-pay | Admitting: Gastroenterology

## 2014-05-31 ENCOUNTER — Ambulatory Visit (INDEPENDENT_AMBULATORY_CARE_PROVIDER_SITE_OTHER): Payer: 59 | Admitting: Cardiovascular Disease

## 2014-05-31 ENCOUNTER — Encounter: Payer: Self-pay | Admitting: Cardiovascular Disease

## 2014-05-31 ENCOUNTER — Encounter: Payer: Self-pay | Admitting: *Deleted

## 2014-05-31 VITALS — BP 146/91 | HR 73 | Ht 69.0 in | Wt 324.0 lb

## 2014-05-31 DIAGNOSIS — G4730 Sleep apnea, unspecified: Secondary | ICD-10-CM

## 2014-05-31 DIAGNOSIS — R0989 Other specified symptoms and signs involving the circulatory and respiratory systems: Secondary | ICD-10-CM

## 2014-05-31 DIAGNOSIS — R0609 Other forms of dyspnea: Secondary | ICD-10-CM

## 2014-05-31 DIAGNOSIS — R0600 Dyspnea, unspecified: Secondary | ICD-10-CM

## 2014-05-31 DIAGNOSIS — I10 Essential (primary) hypertension: Secondary | ICD-10-CM

## 2014-05-31 MED ORDER — AMLODIPINE BESYLATE 10 MG PO TABS: 10.0000 mg | ORAL_TABLET | Freq: Every day | ORAL | Status: DC

## 2014-05-31 NOTE — Patient Instructions (Addendum)
   Increase Amlodipine to 10mg  daily. New sent to pharmacy. Continue all other medications.   Your physician has requested that you have an echocardiogram with contrast. Echocardiography is a painless test that uses sound waves to create images of your heart. It provides your doctor with information about the size and shape of your heart and how well your heart's chambers and valves are working. This procedure takes approximately one hour. There are no restrictions for this procedure. Your physician has requested that you have a lexiscan myoview. For further information please visit [email or web address]. Please follow instruction sheet, as given. Office will contact with results via phone or letter.   Your physician wants you to follow up in:  1 month.

## 2014-05-31 NOTE — Progress Notes (Signed)
Patient ID: [name], male   DOB: 06/06/1946, 68 y.o.   MRN: [id number]      SUBJECTIVE: The patient presents for followup of dyspnea with exertion. He previously told me that his symptoms resolved with cessation of Lasix. He has begun taking Lasix again. He has been experiencing continued dyspnea with exertion. He denies orthopnea and paroxysmal nocturnal dyspnea. He has sleep apnea and uses CPAP. His symptoms appear to be provoked after eating. He said he underwent an EGD and colonoscopy which he says were unremarkable. He occasionally has mild chest tightness which he attributes to anxiety. He occasionally has mild leg swelling.   Review of Systems: As per "subjective", otherwise negative.  Allergies  Allergen Reactions  . Bee Venom     Current Outpatient Prescriptions  Medication Sig Dispense Refill  . amLODipine (NORVASC) 5 MG tablet Take 5 mg by mouth daily with breakfast.      . celecoxib (CELEBREX) 200 MG capsule 2 tab am      . cyclobenzaprine (FLEXERIL) 10 MG tablet Take 10 mg by mouth as needed. Muscle spasm      . cycloSPORINE (RESTASIS) 0.05 % ophthalmic emulsion 1 drop as directed.      Marland Kitchen losartan-hydrochlorothiazide (HYZAAR) 50-12.5 MG per tablet Take 1 tablet by mouth daily with breakfast.      . meloxicam (MOBIC) 7.5 MG tablet Take 7.5 mg by mouth daily.      . methadone (DOLOPHINE) 10 MG tablet Take 20 mg by mouth 3 (three) times daily.       . Psyllium (METAMUCIL PO) Take by mouth. 3-4 scoops daily      . venlafaxine XR (EFFEXOR-XR) 150 MG 24 hr capsule Take 300 mg by mouth daily with breakfast.       No current facility-administered medications for this visit.    Past Medical History  Diagnosis Date  . Hypertension   . Depression   . GERD (gastroesophageal reflux disease)   . History of blood transfusion   . Hemorrhoids   . Insomnia   . Anginal pain 2006    evaluated by cardio  . Sleep apnea     cpap  . Arthritis    knees,feet,shoulders,elbows.hands    Past Surgical History  Procedure Laterality Date  . Colonoscopy w/ polypectomy    . Esophagoscopy    . Vertical banded gastroplasty    . Eye surgery      left eye- muscle repair  . Appendectomy    . Hernia repair      ventral hernia  . Cholecystectomy    . Joint replacement      bilateral hips  . Esophagogastroduodenoscopy (egd) with propofol  07/06/2012    Procedure: ESOPHAGOGASTRODUODENOSCOPY (EGD) WITH PROPOFOL;  Surgeon: Jeryl Columbia, MD;  Location: [facility];  Service: Endoscopy;  Laterality: N/A;    History   Social History  . Marital Status: Divorced    Spouse Name: N/A    Number of Children: N/A  . Years of Education: N/A   Occupational History  . Not on file.   Social History Main Topics  . Smoking status: Former Smoker -- 2.00 packs/day for 10 years    Types: Cigarettes    Quit date: 03/11/1984  . Smokeless tobacco: Never Used  . Alcohol Use: Yes  . Drug Use: No  . Sexual Activity: Not on file   Other Topics Concern  . Not on file   Social History Narrative  . No narrative on file  Filed Vitals:   05/31/14 1356  Height: 5\' 9"  (1.753 m)  Weight: 324 lb (146.965 kg)   BP 146/91  Pulse 73   PHYSICAL EXAM General: NAD, morbidly obese HEENT: Normal. Neck: No JVD, no thyromegaly. Lungs: Clear to auscultation bilaterally with normal respiratory effort. CV: Nondisplaced PMI.  Regular rate and rhythm, normal S1/S2, no S3/S4, no murmur. No pretibial or periankle edema.  No carotid bruit.   Abdomen: Soft, morbidly obese.  Neurologic: Alert and oriented.  Psych: Normal affect. Skin: Lower extremity stasis dermatitis. Musculoskeletal: Normal range of motion, no gross deformities. Extremities: No clubbing or cyanosis.   ECG: Most recent ECG reviewed.      ASSESSMENT AND PLAN: 1. Dysnpea with exertion: Given the recurrence of symptoms, I will proceed with echocardiography and a Lexiscan nuclear stress  test for further clarification. 2. Essential HTN: Elevated again in office today. On amlodipine 5 mg daily and Hyzaar 50-12.5 mg daily. Will increase amlodipine to 10 mg daily. 3. Sleep apnea: Uses CPAP.  Dispo: f/u 1 month.  Kate Sable, M.D., F.A.C.C.

## 2014-06-09 ENCOUNTER — Encounter (HOSPITAL_COMMUNITY)
Admission: RE | Admit: 2014-06-09 | Discharge: 2014-06-09 | Disposition: A | Discharge status: Home / Self Care | Payer: Medicare Other | Source: Ambulatory Visit | Attending: Cardiovascular Disease | Admitting: Cardiovascular Disease

## 2014-06-09 ENCOUNTER — Ambulatory Visit (HOSPITAL_COMMUNITY)
Admission: RE | Admit: 2014-06-09 | Discharge: 2014-06-09 | Disposition: A | Discharge status: Home / Self Care | Payer: Medicare Other | Source: Ambulatory Visit | Attending: Cardiovascular Disease | Admitting: Cardiovascular Disease

## 2014-06-09 ENCOUNTER — Encounter (HOSPITAL_COMMUNITY): Payer: Self-pay

## 2014-06-09 DIAGNOSIS — E669 Obesity, unspecified: Secondary | ICD-10-CM | POA: Insufficient documentation

## 2014-06-09 DIAGNOSIS — R0600 Dyspnea, unspecified: Secondary | ICD-10-CM | POA: Diagnosis present

## 2014-06-09 DIAGNOSIS — F17200 Nicotine dependence, unspecified, uncomplicated: Secondary | ICD-10-CM | POA: Diagnosis not present

## 2014-06-09 DIAGNOSIS — I071 Rheumatic tricuspid insufficiency: Secondary | ICD-10-CM | POA: Diagnosis not present

## 2014-06-09 DIAGNOSIS — R0609 Other forms of dyspnea: Secondary | ICD-10-CM | POA: Insufficient documentation

## 2014-06-09 DIAGNOSIS — Z6841 Body Mass Index (BMI) 40.0 and over, adult: Secondary | ICD-10-CM | POA: Diagnosis not present

## 2014-06-09 DIAGNOSIS — G4730 Sleep apnea, unspecified: Secondary | ICD-10-CM | POA: Insufficient documentation

## 2014-06-09 DIAGNOSIS — I10 Essential (primary) hypertension: Secondary | ICD-10-CM | POA: Insufficient documentation

## 2014-06-09 DIAGNOSIS — I517 Cardiomegaly: Secondary | ICD-10-CM

## 2014-06-09 MED ORDER — TECHNETIUM TC 99M SESTAMIBI - CARDIOLITE
10.0000 | Freq: Once | INTRAVENOUS | Status: AC | PRN
  Administered 2014-06-09: 10 via INTRAVENOUS

## 2014-06-09 MED ORDER — REGADENOSON 0.4 MG/5ML IV SOLN
0.4000 mg | Freq: Once | INTRAVENOUS | Status: AC
  Administered 2014-06-09: 0.4 mg via INTRAVENOUS

## 2014-06-09 MED ORDER — SODIUM CHLORIDE 0.9 % IJ SOLN
20.0000 mL | Freq: Once | INTRAMUSCULAR | Status: AC
  Administered 2014-06-09: 20 mL via INTRAVENOUS

## 2014-06-09 MED ORDER — SODIUM CHLORIDE 0.9 % IJ SOLN
10.0000 mL | INTRAMUSCULAR | Status: DC | PRN
  Administered 2014-06-09: 10 mL via INTRAVENOUS

## 2014-06-09 MED ORDER — SODIUM CHLORIDE 0.9 % IJ SOLN
INTRAMUSCULAR | Status: AC
  Filled 2014-06-09: qty 36

## 2014-06-09 MED ORDER — TECHNETIUM TC 99M SESTAMIBI GENERIC - CARDIOLITE
30.0000 | Freq: Once | INTRAVENOUS | Status: AC | PRN
  Administered 2014-06-09: 30 via INTRAVENOUS

## 2014-06-09 MED ORDER — PERFLUTREN LIPID MICROSPHERE
1.0000 mL | INTRAVENOUS | Status: AC | PRN
Start: 2014-06-09 — End: 2014-06-09
  Administered 2014-06-09: 4 mL via INTRAVENOUS
  Administered 2014-06-09 (×2): 3 mL via INTRAVENOUS
  Filled 2014-06-09: qty 10

## 2014-06-09 NOTE — Progress Notes (Signed)
Stress Lab Nurses Notes - [name] 06/09/2014 Reason for doing test: Dyspnea Type of test: Carlton Adam MyoviewCardiolite Nurse performing test: Benay Pillow Rn Nuclear Medicine Tech: Melburn Hake Echo Tech: Not Applicable MD performing test: S. McDowell/K Armen Pickup MD: Dr. Edrick Oh Test explained and consent signed: Yes.   IV started: Saline lock started in radiology Symptoms: SOB Treatment/Intervention: None Reason test stopped: protocol completed After recovery IV was: Discontinued via X-ray tech and No redness or edema Patient to return to Nuc. Med at : 1215 after Echo complete Patient discharged: Home Patient's Condition upon discharge was: stable Comments: symptoms resolved in recovery. Peak BP 146/75 HR 88, recovery bp 151/87, HR 82 [name]

## 2014-06-09 NOTE — Progress Notes (Signed)
  Echocardiogram 2D Echocardiogram has been performed.  [place], [place] 06/09/2014, 2:49 PM

## 2014-06-12 ENCOUNTER — Telehealth: Payer: Self-pay | Admitting: *Deleted

## 2014-06-12 NOTE — Telephone Encounter (Signed)
Pt informed of results.

## 2014-06-12 NOTE — Telephone Encounter (Signed)
Message copied by Orion Modest on Mon Jun 12, 2014  9:45 AM ------      Message from: Kate Sable A      Created: Mon Jun 12, 2014  8:56 AM       Low risk study. No clear evidence of blockage. Will f/u with pt in office. ------

## 2014-06-13 ENCOUNTER — Telehealth: Payer: Self-pay | Admitting: *Deleted

## 2014-06-13 DIAGNOSIS — Z01810 Encounter for preprocedural cardiovascular examination: Secondary | ICD-10-CM

## 2014-06-13 DIAGNOSIS — I10 Essential (primary) hypertension: Secondary | ICD-10-CM

## 2014-06-13 DIAGNOSIS — I77810 Thoracic aortic ectasia: Secondary | ICD-10-CM

## 2014-06-13 NOTE — Telephone Encounter (Signed)
Message copied by Laurine Blazer on Tue Jun 13, 2014  4:37 PM ------      Message from: Orion Modest      Created: Mon Jun 12, 2014  9:41 AM                   ----- Message -----         From: Herminio Commons, MD         Sent: 06/12/2014   8:58 AM           To: Orion Modest, CMA            Normal pumping function. Mild aortic root dilatation. Would recommend CTA aorta to evaluate entirety of aorta. ------

## 2014-06-13 NOTE — Telephone Encounter (Signed)
Notes Recorded by Laurine Blazer, LPN on 09/40/7680 at 2:00 PM Patient notified this morning. He agrees to further testing. Will put order in & forward to [facility]) for scheduling.

## 2014-06-19 ENCOUNTER — Telehealth: Payer: Self-pay | Admitting: Cardiovascular Disease

## 2014-06-19 NOTE — Telephone Encounter (Signed)
Yes, please reschedule till after CTA.  Did not realize follow up was tomorrow.

## 2014-06-19 NOTE — Telephone Encounter (Signed)
Please schedule CTA chest/aorta with contrast - order is in.  DX: Aortic root dilatation    SCHEDULE at [facility] 10/28 arrive at 8:30 Only liquids 2 hours prior to test Patient is aware to have labs done this week. He thinks he had Bmet done recently at Dr Murrell Redden office if not he will do this week.

## 2014-06-19 NOTE — Telephone Encounter (Signed)
Would like to know if he needs to keep appt for 10/20. He is scheduled for CXT on 10/28

## 2014-06-20 ENCOUNTER — Other Ambulatory Visit: Payer: Self-pay | Admitting: *Deleted

## 2014-06-20 ENCOUNTER — Ambulatory Visit: Payer: 59 | Admitting: Cardiovascular Disease

## 2014-06-20 DIAGNOSIS — I77810 Thoracic aortic ectasia: Secondary | ICD-10-CM

## 2014-06-20 NOTE — Telephone Encounter (Signed)
Authorized CTA Chest/Aorta and also CTA abdomen/pelvis.  Message to [facility] to add order for CTA abdomen/pelvis that is needed to evaluate entire aorta.  This was verified with [facility].  Order will also need to be linked to his appointment on 10/28 @ [facility].

## 2014-06-28 ENCOUNTER — Encounter (HOSPITAL_COMMUNITY): Payer: Self-pay

## 2014-06-28 ENCOUNTER — Ambulatory Visit (HOSPITAL_COMMUNITY)
Admission: RE | Admit: 2014-06-28 | Discharge: 2014-06-28 | Disposition: A | Discharge status: Home / Self Care | Payer: Medicare Other | Source: Ambulatory Visit | Attending: Cardiovascular Disease | Admitting: Cardiovascular Disease

## 2014-06-28 DIAGNOSIS — I77810 Thoracic aortic ectasia: Secondary | ICD-10-CM

## 2014-06-28 DIAGNOSIS — I77819 Aortic ectasia, unspecified site: Secondary | ICD-10-CM | POA: Insufficient documentation

## 2014-06-28 LAB — POCT I-STAT CREATININE: Creatinine, Ser: 1.1 mg/dL (ref 0.50–1.35)

## 2014-06-28 MED ORDER — IOHEXOL 350 MG/ML SOLN
150.0000 mL | Freq: Once | INTRAVENOUS | Status: AC | PRN
  Administered 2014-06-28: 150 mL via INTRAVENOUS

## 2014-06-30 ENCOUNTER — Ambulatory Visit (HOSPITAL_COMMUNITY)
Admission: RE | Admit: 2014-06-30 | Discharge: 2014-06-30 | Disposition: A | Discharge status: Home / Self Care | Payer: Medicare Other | Source: Ambulatory Visit | Attending: Cardiovascular Disease | Admitting: Cardiovascular Disease

## 2014-06-30 MED ORDER — IOHEXOL 350 MG/ML SOLN
100.0000 mL | Freq: Once | INTRAVENOUS | Status: AC | PRN
  Administered 2014-06-30: 100 mL via INTRAVENOUS

## 2014-07-04 ENCOUNTER — Other Ambulatory Visit: Payer: Self-pay | Admitting: *Deleted

## 2014-07-04 DIAGNOSIS — I10 Essential (primary) hypertension: Secondary | ICD-10-CM

## 2014-07-04 DIAGNOSIS — I77810 Thoracic aortic ectasia: Secondary | ICD-10-CM

## 2014-07-04 DIAGNOSIS — Z01810 Encounter for preprocedural cardiovascular examination: Secondary | ICD-10-CM

## 2014-07-06 ENCOUNTER — Telehealth: Payer: Self-pay | Admitting: Cardiovascular Disease

## 2014-07-06 NOTE — Telephone Encounter (Signed)
Checking percert for the following to be done at [facility] on 07-12-14 CT Angio of Neck  -  CT Body

## 2014-07-07 ENCOUNTER — Ambulatory Visit: Payer: Medicare Other | Admitting: Cardiovascular Disease

## 2014-07-07 NOTE — Telephone Encounter (Signed)
[place]

## 2014-07-12 ENCOUNTER — Ambulatory Visit (HOSPITAL_COMMUNITY)
Admission: RE | Admit: 2014-07-12 | Discharge: 2014-07-12 | Disposition: A | Discharge status: Home / Self Care | Payer: Medicare Other | Source: Ambulatory Visit | Attending: Cardiovascular Disease | Admitting: Cardiovascular Disease

## 2014-07-12 ENCOUNTER — Ambulatory Visit (HOSPITAL_COMMUNITY): Payer: Medicare Other

## 2014-07-12 DIAGNOSIS — I77810 Thoracic aortic ectasia: Secondary | ICD-10-CM | POA: Diagnosis present

## 2014-07-12 MED ORDER — IOHEXOL 350 MG/ML SOLN
100.0000 mL | Freq: Once | INTRAVENOUS | Status: AC | PRN
  Administered 2014-07-12: 100 mL via INTRAVENOUS

## 2014-07-14 ENCOUNTER — Telehealth: Payer: Self-pay | Admitting: *Deleted

## 2014-07-14 NOTE — Telephone Encounter (Signed)
Notes Recorded by Laurine Blazer, LPN on 68/07/5725 at 2:17 PM Patient notified. Will forward copy to PMD. Already has 1 mo follow up scheduled for 08/01/14 with Dr. Bronson Ing.

## 2014-07-14 NOTE — Telephone Encounter (Signed)
-----   Message from Herminio Commons, MD sent at 07/13/2014  9:54 AM EST ----- Aorta looks normal. Would f/u with PCP regarding biliary system dilatation.

## 2014-08-01 ENCOUNTER — Ambulatory Visit: Payer: Medicare Other | Admitting: Cardiovascular Disease

## 2014-08-02 ENCOUNTER — Ambulatory Visit: Payer: Medicare Other | Admitting: Cardiovascular Disease

## 2014-08-03 ENCOUNTER — Ambulatory Visit (INDEPENDENT_AMBULATORY_CARE_PROVIDER_SITE_OTHER): Payer: Medicare Other | Admitting: Cardiovascular Disease

## 2014-08-03 ENCOUNTER — Encounter: Payer: Self-pay | Admitting: Cardiovascular Disease

## 2014-08-03 VITALS — BP 134/70 | HR 72 | Ht 69.0 in | Wt 320.0 lb

## 2014-08-03 DIAGNOSIS — G4730 Sleep apnea, unspecified: Secondary | ICD-10-CM

## 2014-08-03 DIAGNOSIS — I10 Essential (primary) hypertension: Secondary | ICD-10-CM

## 2014-08-03 DIAGNOSIS — R0600 Dyspnea, unspecified: Secondary | ICD-10-CM

## 2014-08-03 DIAGNOSIS — R0602 Shortness of breath: Secondary | ICD-10-CM

## 2014-08-03 DIAGNOSIS — R0609 Other forms of dyspnea: Secondary | ICD-10-CM

## 2014-08-03 NOTE — Progress Notes (Signed)
Patient ID: [name], male   DOB: [birth year], 68 y.o.   MRN: [id number]      SUBJECTIVE: The patient presents for follow-up on cardiovascular testing performed for the evaluation of dyspnea on exertion. Nuclear stress testing demonstrated diaphragmatic soft tissue attenuation artifact with no clear evidence of ischemia. It was deemed low risk. Echocardiography demonstrated normal left ventricular systolic function and regional wall motion, EF 55-60%, mild left ventricular enlargement, grade 1 diastolic dysfunction , and mild aortic root dilatation. CTA of the chest and abdomen did not demonstrate any evidence of a thoracoabdominal aneurysm. He continues to be dyspneic with exertion, but denies chest pain. This has progressively gotten worse. He has lost a considerable amount of weight as he used to weigh in excess of 600 lbs. He doesn't smoke but his mother was a lifelong smoker and he was exposed to this.  Review of Systems: As per "subjective", otherwise negative.  Allergies  Allergen Reactions  . Bee Venom     Current Outpatient Prescriptions  Medication Sig Dispense Refill  . amLODipine (NORVASC) 10 MG tablet Take 1 tablet (10 mg total) by mouth daily. 30 tablet 6  . furosemide (LASIX) 20 MG tablet Take 1 tablet by mouth daily.    . meloxicam (MOBIC) 7.5 MG tablet Take 7.5 mg by mouth daily.    . methadone (DOLOPHINE) 10 MG tablet Take 20 mg by mouth every 12 (twelve) hours.     Marland Kitchen omeprazole (PRILOSEC) 40 MG capsule Take 40 mg by mouth daily.    . Psyllium (METAMUCIL PO) Take by mouth as needed. 3-4 scoops daily    . venlafaxine XR (EFFEXOR-XR) 150 MG 24 hr capsule Take 300 mg by mouth daily with breakfast.     No current facility-administered medications for this visit.    Past Medical History  Diagnosis Date  . Hypertension   . Depression   . GERD (gastroesophageal reflux disease)   . History of blood transfusion   . Hemorrhoids   . Insomnia   . Anginal pain 2006   evaluated by cardio  . Sleep apnea     cpap  . Arthritis     knees,feet,shoulders,elbows.hands    Past Surgical History  Procedure Laterality Date  . Colonoscopy w/ polypectomy    . Esophagoscopy    . Vertical banded gastroplasty    . Eye surgery      left eye- muscle repair  . Appendectomy    . Hernia repair      ventral hernia  . Cholecystectomy    . Joint replacement      bilateral hips  . Esophagogastroduodenoscopy (egd) with propofol  07/06/2012    Procedure: ESOPHAGOGASTRODUODENOSCOPY (EGD) WITH PROPOFOL;  Surgeon: Jeryl Columbia, MD;  Location: [facility];  Service: Endoscopy;  Laterality: N/A;    History   Social History  . Marital Status: Divorced    Spouse Name: N/A    Number of Children: N/A  . Years of Education: N/A   Occupational History  . Not on file.   Social History Main Topics  . Smoking status: Former Smoker -- 2.00 packs/day for 10 years    Types: Cigarettes    Start date: 05/16/1966    Quit date: 03/11/1984  . Smokeless tobacco: Never Used  . Alcohol Use: Yes     Comment: occasionally a couple of times a year   . Drug Use: No  . Sexual Activity: Not on file   Other Topics Concern  . Not on  file   Social History Narrative     Filed Vitals:   08/03/14 1515  BP: 134/70  Pulse: 72  Height: 5\' 9"  (1.753 m)  Weight: 320 lb (145.151 kg)  SpO2: 98%    PHYSICAL EXAM General: NAD, morbidly obese HEENT: Normal. Neck: No JVD, no thyromegaly. Lungs: Clear to auscultation bilaterally with normal respiratory effort. CV: Nondisplaced PMI. Regular rate and rhythm, normal S1/S2, no S3/S4, no murmur. No pretibial or periankle edema. No carotid bruit.  Abdomen: Soft, morbidly obese.  Neurologic: Alert and oriented.  Psych: Normal affect. Skin: Lower extremity stasis dermatitis. Musculoskeletal: Normal range of motion, no gross deformities. Extremities: No clubbing or cyanosis.   ECG: Most recent ECG reviewed.    ASSESSMENT AND  PLAN: 1. Dysnpea with exertion: Does not appear to be cardiac in etiology. It may be related to COPD due to longstanding secondhand tobacco exposure. I will obtain PFT's. He may also have an element of restrictive lung pathology due to obesity-hypoventilation syndrome. 2. Essential HTN: Well controlled on current therapy. No changes indicated. 3. Sleep apnea: Uses CPAP.  Dispo: f/u prn.   Kate Sable, M.D., F.A.C.C.

## 2014-08-03 NOTE — Patient Instructions (Signed)
Your physician recommends that you schedule a follow-up appointment in: as needed. Your physician recommends that you continue on your current medications as directed. Please refer to the Current Medication list given to you today. Your physician has recommended that you have a pulmonary function test. Pulmonary Function Tests are a group of tests that measure how well air moves in and out of your lungs.

## 2014-08-16 ENCOUNTER — Ambulatory Visit (HOSPITAL_COMMUNITY)
Admission: RE | Admit: 2014-08-16 | Discharge: 2014-08-16 | Disposition: A | Discharge status: Home / Self Care | Payer: Medicare Other | Source: Ambulatory Visit | Attending: Cardiovascular Disease | Admitting: Cardiovascular Disease

## 2014-08-16 DIAGNOSIS — R0609 Other forms of dyspnea: Secondary | ICD-10-CM | POA: Diagnosis not present

## 2014-08-16 DIAGNOSIS — R0602 Shortness of breath: Secondary | ICD-10-CM | POA: Insufficient documentation

## 2014-08-16 DIAGNOSIS — R0600 Dyspnea, unspecified: Secondary | ICD-10-CM

## 2014-08-16 LAB — PULMONARY FUNCTION TEST
DL/VA % pred: 116 %
DL/VA: 5.29 ml/min/mmHg/L
DLCO cor % pred: 80 %
DLCO cor: 24.97 ml/min/mmHg
DLCO unc % pred: 79 %
DLCO unc: 24.75 ml/min/mmHg
FEF 25-75 Post: 1.67 L/sec
FEF 25-75 Pre: 1.41 L/sec
FEF2575-%Change-Post: 18 %
FEF2575-%Pred-Post: 67 %
FEF2575-%Pred-Pre: 57 %
FEV1-%Change-Post: 5 %
FEV1-%Pred-Post: 72 %
FEV1-%Pred-Pre: 68 %
FEV1-Post: 2.3 L
FEV1-Pre: 2.18 L
FEV1FVC-%Change-Post: 3 %
FEV1FVC-%Pred-Pre: 93 %
FEV6-%Change-Post: 2 %
FEV6-%Pred-Post: 78 %
FEV6-%Pred-Pre: 75 %
FEV6-Post: 3.19 L
FEV6-Pre: 3.1 L
FEV6FVC-%Change-Post: 0 %
FEV6FVC-%Pred-Post: 105 %
FEV6FVC-%Pred-Pre: 105 %
FVC-%Change-Post: 2 %
FVC-%Pred-Post: 74 %
FVC-%Pred-Pre: 72 %
FVC-Post: 3.2 L
FVC-Pre: 3.13 L
Post FEV1/FVC ratio: 72 %
Post FEV6/FVC ratio: 100 %
Pre FEV1/FVC ratio: 70 %
Pre FEV6/FVC Ratio: 99 %
RV % pred: 92 %
RV: 2.17 L
TLC % pred: 88 %
TLC: 6.01 L

## 2014-08-16 MED ORDER — ALBUTEROL SULFATE (2.5 MG/3ML) 0.083% IN NEBU
2.5000 mg | INHALATION_SOLUTION | Freq: Once | RESPIRATORY_TRACT | Status: AC
  Administered 2014-08-16: 2.5 mg via RESPIRATORY_TRACT

## 2014-08-17 ENCOUNTER — Telehealth: Payer: Self-pay | Admitting: *Deleted

## 2014-08-17 NOTE — Telephone Encounter (Signed)
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 Can f/u with PCP regarding this.

## 2014-08-17 NOTE — Telephone Encounter (Signed)
Patient informed and copy sent to PCP. 

## 2014-12-21 ENCOUNTER — Encounter: Payer: Self-pay | Admitting: *Deleted

## 2014-12-25 ENCOUNTER — Ambulatory Visit (INDEPENDENT_AMBULATORY_CARE_PROVIDER_SITE_OTHER): Payer: Medicare Other | Admitting: Cardiovascular Disease

## 2014-12-25 ENCOUNTER — Encounter: Payer: Self-pay | Admitting: Cardiovascular Disease

## 2014-12-25 VITALS — BP 118/74 | HR 94 | Ht 69.0 in | Wt 337.0 lb

## 2014-12-25 DIAGNOSIS — G4730 Sleep apnea, unspecified: Secondary | ICD-10-CM

## 2014-12-25 DIAGNOSIS — R0600 Dyspnea, unspecified: Secondary | ICD-10-CM

## 2014-12-25 DIAGNOSIS — R55 Syncope and collapse: Secondary | ICD-10-CM | POA: Diagnosis not present

## 2014-12-25 DIAGNOSIS — R0609 Other forms of dyspnea: Secondary | ICD-10-CM

## 2014-12-25 DIAGNOSIS — I10 Essential (primary) hypertension: Secondary | ICD-10-CM

## 2014-12-25 DIAGNOSIS — R0602 Shortness of breath: Secondary | ICD-10-CM

## 2014-12-25 DIAGNOSIS — I77810 Thoracic aortic ectasia: Secondary | ICD-10-CM

## 2014-12-25 MED ORDER — AMLODIPINE BESYLATE 5 MG PO TABS
5.0000 mg | ORAL_TABLET | Freq: Every day | ORAL | Status: DC
Start: 2014-12-25 — End: 2015-01-23

## 2014-12-25 NOTE — Patient Instructions (Signed)
   Decrease Amlodipine to 5mg  daily - new sent to pharm today Continue all other medications.   Follow up as needed.

## 2014-12-25 NOTE — Progress Notes (Signed)
Patient ID: [name], male   DOB: [birth year], 69 y.o.   MRN: [id number]      SUBJECTIVE: The patient presents for evaluation of near syncope. He saw his PCP earlier this month for this. There appeared to be a vasovagal component. He has chronic exertional dyspnea as well as hypertension and sleep apnea and morbid obesity. 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  Yesterday he had an episode of dizziness with near syncope after standing up in church after being seated for some time. Episodes like this also occur with straining while having a bowel movement. He denies chest pain and palpitations.   Review of Systems: As per "subjective", otherwise negative.  Allergies  Allergen Reactions  . Bee Venom     Current Outpatient Prescriptions  Medication Sig Dispense Refill  . amLODipine (NORVASC) 10 MG tablet Take 1 tablet (10 mg total) by mouth daily. 30 tablet 6  . aspirin EC 81 MG tablet Take 81 mg by mouth daily.    . cyclobenzaprine (FLEXERIL) 10 MG tablet Take 1 tablet by mouth 2 (two) times daily.    . diphenhydrAMINE (BENADRYL) 25 MG tablet Take 50 mg by mouth at bedtime as needed.    . furosemide (LASIX) 20 MG tablet Take 1 tablet by mouth daily.    . Magnesium 500 MG TABS Take 1 tablet by mouth daily.    . meloxicam (MOBIC) 7.5 MG tablet Take 15 mg by mouth daily.     . methadone (DOLOPHINE) 10 MG tablet Take 20 mg by mouth every 12 (twelve) hours.     . Multiple Vitamins-Minerals (CENTRUM ADULTS PO) Take 1 tablet by mouth daily.    Marland Kitchen omeprazole (PRILOSEC) 40 MG capsule Take 40 mg by mouth daily.    . Psyllium (METAMUCIL PO) Take by mouth as needed. 3-4 scoops daily    . Testosterone Propionate POWD Apply 1 g topically daily.    Marland Kitchen venlafaxine XR (EFFEXOR-XR) 150 MG 24 hr capsule Take 300 mg by mouth daily with  breakfast.     No current facility-administered medications for this visit.    Past Medical History  Diagnosis Date  . Hypertension   . Depression   . GERD (gastroesophageal reflux disease)   . History of blood transfusion   . Hemorrhoids   . Insomnia   . Anginal pain 2006    evaluated by cardio  . Sleep apnea     cpap  . Arthritis     knees,feet,shoulders,elbows.hands    Past Surgical History  Procedure Laterality Date  . Colonoscopy w/ polypectomy    . Esophagoscopy    . Vertical banded gastroplasty    . Eye surgery      left eye- muscle repair  . Appendectomy    . Hernia repair      ventral hernia  . Cholecystectomy    . Joint replacement      bilateral hips  . Esophagogastroduodenoscopy (egd) with propofol  07/06/2012    Procedure: ESOPHAGOGASTRODUODENOSCOPY (EGD) WITH PROPOFOL;  Surgeon: Jeryl Columbia, MD;  Location: [facility];  Service: Endoscopy;  Laterality: N/A;    History   Social History  . Marital Status: Divorced    Spouse Name: N/A  . Number of Children: N/A  . Years of Education: N/A   Occupational History  . Not on file.   Social History Main Topics  .  Smoking status: Former Smoker -- 2.00 packs/day for 10 years    Types: Cigarettes    Start date: 05/16/1966    Quit date: 03/11/1984  . Smokeless tobacco: Never Used  . Alcohol Use: 0.0 oz/week    0 Standard drinks or equivalent per week     Comment: occasionally a couple of times a year   . Drug Use: No  . Sexual Activity: Not on file   Other Topics Concern  . Not on file   Social History Narrative     Filed Vitals:   12/25/14 1039 12/25/14 1040 12/25/14 1042 12/25/14 1045  BP: 117/68 92/60 109/72 118/74  Pulse: 85 88 94 94  Height:      Weight:        PHYSICAL EXAM General: NAD, morbidly obese HEENT: Normal. Neck: No JVD, no thyromegaly. Lungs: Clear to auscultation bilaterally with normal respiratory effort. CV: Nondisplaced PMI. Regular rate and rhythm, normal S1/S2,  no S3/S4, no murmur. No pretibial or periankle edema. No carotid bruit.  Abdomen: Soft, morbidly obese.  Neurologic: Alert and oriented.  Psych: Normal affect. Skin: Lower extremity stasis dermatitis. Musculoskeletal: Normal range of motion, no gross deformities. Extremities: No clubbing or cyanosis.   ECG: Most recent ECG reviewed.      ASSESSMENT AND PLAN: 1. Near syncope: Symptoms consistent with vasovagal etiology. Will reduce amlodipine to 5 mg. Also encouraged him to consider taking Lasix every other day to avoid intravascular depletion, which will also precipitate these episodes. Low suspicion for arrhythmic etiology. 2. Essential HTN: BP controlled to low normal. Given near syncopal episodes, will reduce amlodipine to 5 mg daily. 3. Chronic exertional dyspnea: Due to mild obstructive and combined restrictive process, with component of obesity-hypoventilation syndrome.  Dispo: f/u prn.  Kate Sable, M.D., F.A.C.C.

## 2014-12-25 NOTE — Addendum Note (Signed)
Addended by: Laurine Blazer on: 12/25/2014 11:25 AM   Modules accepted: Orders, Level of Service

## 2014-12-28 ENCOUNTER — Other Ambulatory Visit: Payer: Self-pay | Admitting: Gastroenterology

## 2014-12-28 DIAGNOSIS — D500 Iron deficiency anemia secondary to blood loss (chronic): Secondary | ICD-10-CM

## 2015-01-01 ENCOUNTER — Other Ambulatory Visit: Payer: Medicare Other

## 2015-01-09 ENCOUNTER — Ambulatory Visit
Admission: RE | Admit: 2015-01-09 | Discharge: 2015-01-09 | Disposition: A | Discharge status: Home / Self Care | Payer: Medicare Other | Source: Ambulatory Visit | Attending: Gastroenterology | Admitting: Gastroenterology

## 2015-01-09 DIAGNOSIS — D500 Iron deficiency anemia secondary to blood loss (chronic): Secondary | ICD-10-CM

## 2015-01-09 MED ORDER — IOHEXOL 300 MG/ML  SOLN
150.0000 mL | Freq: Once | INTRAMUSCULAR | Status: AC | PRN
  Administered 2015-01-09: 150 mL via INTRAVENOUS

## 2015-01-22 ENCOUNTER — Telehealth: Payer: Self-pay | Admitting: Physician Assistant

## 2015-01-22 NOTE — Telephone Encounter (Signed)
Patient contacted the after hour cardiology service stating his blood pressure has been 218/119 today. He noted his blood pressure was elevated around 6 PM. He endorsed some dizziness, however denies any chest pain, shortness breath or feeling of passing out. He denies any slurring of speech, headache or weakness. He retested the blood pressure 2 hours later and the systolic blood pressure went down to 170s.  Of note, he was seen by Dr. Burke Keels in April, at which time his blood pressure was low and he was having vasovagal symptom. His amlodipine was cut back from 10 mg daily to 5 mg daily. Given his current uncontrolled blood pressure and how labile it has been, I have advised him to continue on the 5 mg daily of amlodipine. Recheck his blood pressure in p.m., if his systolic blood pressure is greater than 160, he will need to take an additional dose of amlodipine at night. If he has been taking more p.m. Doses than not, he will need to be reassessed by Dr. Bronson Ing to see if additional blood pressure medication should be added.  Hilbert Corrigan PA Pager: [phone]

## 2015-01-23 ENCOUNTER — Telehealth: Payer: Self-pay | Admitting: Cardiovascular Disease

## 2015-01-23 MED ORDER — AMLODIPINE BESYLATE 5 MG PO TABS
7.5000 mg | ORAL_TABLET | Freq: Every day | ORAL | Status: DC
Start: 2015-01-23 — End: 2017-12-22

## 2015-01-23 NOTE — Telephone Encounter (Addendum)
Patient calling in reference to elevated BP since last pm.  See phone note last evening [name] [place], [place]).  Patient stated he had episode last evening where he broke out in cold sweat, shaking.  Questioned if maybe his sugar may have dropped.  Checked BP after that & reading was 218/119.  He has been taking Amlodipine 5mg  daily per last OV instructions.  PA had advised him if systolic was greater than 160 to take an extra Amlodipine 5mg  that evening.  Stated that he did take an additional Amlodipine & then went to sleep some time afterwards.  This morning around 11:00 am prior to morning meds, BP was 170/118.  I did not advise patient to go to ED as he was not experiencing any other symptoms.  Informed patient that message will be sent to provider for any further advice.

## 2015-01-23 NOTE — Telephone Encounter (Signed)
Would start by taking amlodipine 7.5 mg daily. If after one week BP remains persistently elevated, would then increase to 10 mg daily. Again, he can f/u with me prn. He should go see PCP.

## 2015-01-23 NOTE — Telephone Encounter (Signed)
Patient notified verbalized understanding

## 2015-01-23 NOTE — Telephone Encounter (Signed)
5/23 218/119 @ 6:00 pm 5/24 172/119 @11 :20 am Patient wanting to know if he needs to go to ER

## 2015-02-19 ENCOUNTER — Other Ambulatory Visit: Payer: Self-pay | Admitting: Surgery

## 2015-02-22 ENCOUNTER — Encounter (HOSPITAL_COMMUNITY)
Admission: RE | Admit: 2015-02-22 | Discharge: 2015-02-22 | Disposition: A | Discharge status: Home / Self Care | Payer: Medicare Other | Source: Ambulatory Visit | Attending: Surgery | Admitting: Surgery

## 2015-02-22 ENCOUNTER — Encounter (HOSPITAL_COMMUNITY): Payer: Self-pay

## 2015-02-22 DIAGNOSIS — R1900 Intra-abdominal and pelvic swelling, mass and lump, unspecified site: Secondary | ICD-10-CM | POA: Diagnosis not present

## 2015-02-22 DIAGNOSIS — Z01812 Encounter for preprocedural laboratory examination: Secondary | ICD-10-CM | POA: Diagnosis not present

## 2015-02-22 HISTORY — DX: Constipation, unspecified: K59.00

## 2015-02-22 HISTORY — DX: Cardiac arrhythmia, unspecified: I49.9

## 2015-02-22 LAB — CBC WITH DIFFERENTIAL/PLATELET
Basophils Absolute: 0 10*3/uL (ref 0.0–0.1)
Basophils Relative: 1 % (ref 0–1)
Eosinophils Absolute: 0.1 10*3/uL (ref 0.0–0.7)
Eosinophils Relative: 3 % (ref 0–5)
HCT: 34.8 % — ABNORMAL LOW (ref 39.0–52.0)
Hemoglobin: 11.1 g/dL — ABNORMAL LOW (ref 13.0–17.0)
Lymphocytes Relative: 26 % (ref 12–46)
Lymphs Abs: 1.1 10*3/uL (ref 0.7–4.0)
MCH: 28.9 pg (ref 26.0–34.0)
MCHC: 31.9 g/dL (ref 30.0–36.0)
MCV: 90.6 fL (ref 78.0–100.0)
Monocytes Absolute: 0.5 10*3/uL (ref 0.1–1.0)
Monocytes Relative: 10 % (ref 3–12)
Neutro Abs: 2.7 10*3/uL (ref 1.7–7.7)
Neutrophils Relative %: 60 % (ref 43–77)
Platelets: 178 10*3/uL (ref 150–400)
RBC: 3.84 MIL/uL — ABNORMAL LOW (ref 4.22–5.81)
RDW: 14.8 % (ref 11.5–15.5)
WBC: 4.4 10*3/uL (ref 4.0–10.5)

## 2015-02-22 LAB — COMPREHENSIVE METABOLIC PANEL
ALT: 18 U/L (ref 17–63)
AST: 23 U/L (ref 15–41)
Albumin: 3.2 g/dL — ABNORMAL LOW (ref 3.5–5.0)
Alkaline Phosphatase: 116 U/L (ref 38–126)
Anion gap: 7 (ref 5–15)
BUN: 20 mg/dL (ref 6–20)
CO2: 26 mmol/L (ref 22–32)
Calcium: 8.8 mg/dL — ABNORMAL LOW (ref 8.9–10.3)
Chloride: 106 mmol/L (ref 101–111)
Creatinine, Ser: 1.2 mg/dL (ref 0.61–1.24)
GFR calc Af Amer: >60 mL/min (ref >60)
GFR calc non Af Amer: >60 mL/min (ref >60)
Glucose, Bld: 85 mg/dL (ref 65–99)
Potassium: 4.3 mmol/L (ref 3.5–5.1)
Sodium: 139 mmol/L (ref 135–145)
Total Bilirubin: 0.4 mg/dL (ref 0.3–1.2)
Total Protein: 6.8 g/dL (ref 6.5–8.1)

## 2015-02-22 NOTE — Pre-Procedure Instructions (Signed)
    [name]  02/22/2015       Your procedure is scheduled on Friday, July 1.  Report to [facility] Admitting at 5:30A.M.   Call this number if you have problems the morning of surgery: [phone]     Remember:  Do not eat food or drink liquids after midnight Thursday, June 30.  Take these medicines the morning of surgery with A SIP OF WATER : amLODipine, cyclobenzaprine (FLEXERIL), methadone (DOLOPHINE), omeprazole (PRILOSEC)venlafaxine XR (EFFEXOR-XR).                 Stop taking Aspirin, Coumadin, Plavix, Effient and Herbal medications.  Do not take any NSAIDs [id number] (MOBIC),  Ibuprofen,  Advil,Naproxen or any medication containing Aspirin.   Do not wear jewelry, make-up or nail polish.  Do not wear lotions, powders, or perfumes.    Do not shave 48 hours prior to surgery.    Do not bring valuables to the hospital.  [facility] is not responsible for any belongings or valuables.  Contacts, dentures or bridgework may not be worn into surgery.  Leave your suitcase in the car.  After surgery it may be brought to your room.  For patients admitted to the hospital, discharge time will be determined by your treatment team.  Patients discharged the day of surgery will not be allowed to drive home.   Name and phone number of your driver:   -  Special instructions: Review  [place] - Preparing For Surgery.  Please read over the following fact sheets that you were given. Pain Booklet, Coughing and Deep Breathing and Surgical Site Infection Prevention

## 2015-03-01 MED ORDER — DEXTROSE 5 % IV SOLN
3.0000 g | INTRAVENOUS | Status: AC
  Administered 2015-03-02: 3 g via INTRAVENOUS
  Filled 2015-03-01: qty 3000

## 2015-03-02 ENCOUNTER — Encounter (HOSPITAL_COMMUNITY): Payer: Self-pay | Admitting: *Deleted

## 2015-03-02 ENCOUNTER — Ambulatory Visit (HOSPITAL_COMMUNITY): Payer: Medicare Other | Admitting: Anesthesiology

## 2015-03-02 ENCOUNTER — Ambulatory Visit (HOSPITAL_COMMUNITY)
Admission: RE | Admit: 2015-03-02 | Discharge: 2015-03-02 | Disposition: A | Discharge status: Home / Self Care | Payer: Medicare Other | Source: Ambulatory Visit | Attending: Surgery | Admitting: Surgery

## 2015-03-02 ENCOUNTER — Encounter (HOSPITAL_COMMUNITY)
Admission: RE | Disposition: A | Discharge status: Home / Self Care | Payer: Self-pay | Source: Ambulatory Visit | Attending: Surgery

## 2015-03-02 DIAGNOSIS — Z96643 Presence of artificial hip joint, bilateral: Secondary | ICD-10-CM | POA: Insufficient documentation

## 2015-03-02 DIAGNOSIS — Z79899 Other long term (current) drug therapy: Secondary | ICD-10-CM | POA: Insufficient documentation

## 2015-03-02 DIAGNOSIS — D214 Benign neoplasm of connective and other soft tissue of abdomen: Secondary | ICD-10-CM | POA: Insufficient documentation

## 2015-03-02 DIAGNOSIS — G4733 Obstructive sleep apnea (adult) (pediatric): Secondary | ICD-10-CM | POA: Diagnosis not present

## 2015-03-02 DIAGNOSIS — Z6841 Body Mass Index (BMI) 40.0 and over, adult: Secondary | ICD-10-CM | POA: Diagnosis not present

## 2015-03-02 DIAGNOSIS — Z9884 Bariatric surgery status: Secondary | ICD-10-CM | POA: Diagnosis not present

## 2015-03-02 DIAGNOSIS — L905 Scar conditions and fibrosis of skin: Secondary | ICD-10-CM | POA: Diagnosis not present

## 2015-03-02 DIAGNOSIS — I10 Essential (primary) hypertension: Secondary | ICD-10-CM | POA: Diagnosis not present

## 2015-03-02 DIAGNOSIS — R1900 Intra-abdominal and pelvic swelling, mass and lump, unspecified site: Secondary | ICD-10-CM | POA: Diagnosis not present

## 2015-03-02 HISTORY — PX: MASS EXCISION: SHX2000

## 2015-03-02 SURGERY — EXCISION MASS
Anesthesia: General

## 2015-03-02 MED ORDER — ONDANSETRON HCL 4 MG/2ML IJ SOLN
INTRAMUSCULAR | Status: AC
  Filled 2015-03-02: qty 2

## 2015-03-02 MED ORDER — PHENYLEPHRINE 40 MCG/ML (10ML) SYRINGE FOR IV PUSH (FOR BLOOD PRESSURE SUPPORT)
PREFILLED_SYRINGE | INTRAVENOUS | Status: AC
  Filled 2015-03-02: qty 10

## 2015-03-02 MED ORDER — SUCCINYLCHOLINE CHLORIDE 20 MG/ML IJ SOLN
INTRAMUSCULAR | Status: DC | PRN
  Administered 2015-03-02: 140 mg via INTRAVENOUS

## 2015-03-02 MED ORDER — LIDOCAINE HCL (CARDIAC) 20 MG/ML IV SOLN
INTRAVENOUS | Status: DC | PRN
  Administered 2015-03-02: 50 mg via INTRAVENOUS

## 2015-03-02 MED ORDER — OXYCODONE HCL 5 MG PO TABS: 5.0000 mg | ORAL_TABLET | Freq: Once | ORAL | Status: DC | PRN

## 2015-03-02 MED ORDER — EPHEDRINE SULFATE 50 MG/ML IJ SOLN
INTRAMUSCULAR | Status: DC | PRN
  Administered 2015-03-02: 10 mg via INTRAVENOUS
  Administered 2015-03-02: 20 mg via INTRAVENOUS
  Administered 2015-03-02 (×2): 10 mg via INTRAVENOUS

## 2015-03-02 MED ORDER — CHLORHEXIDINE GLUCONATE 4 % EX LIQD: 1.0000 "application " | Freq: Once | CUTANEOUS | Status: DC

## 2015-03-02 MED ORDER — MIDAZOLAM HCL 2 MG/2ML IJ SOLN
INTRAMUSCULAR | Status: AC
  Filled 2015-03-02: qty 2

## 2015-03-02 MED ORDER — FENTANYL CITRATE (PF) 250 MCG/5ML IJ SOLN
INTRAMUSCULAR | Status: AC
  Filled 2015-03-02: qty 5

## 2015-03-02 MED ORDER — PROPOFOL 10 MG/ML IV BOLUS
INTRAVENOUS | Status: AC
  Filled 2015-03-02: qty 20

## 2015-03-02 MED ORDER — LACTATED RINGERS IV SOLN
INTRAVENOUS | Status: DC | PRN
  Administered 2015-03-02 (×2): via INTRAVENOUS

## 2015-03-02 MED ORDER — OXYCODONE HCL 5 MG/5ML PO SOLN: 5.0000 mg | Freq: Once | ORAL | Status: DC | PRN

## 2015-03-02 MED ORDER — MIDAZOLAM HCL 2 MG/2ML IJ SOLN
INTRAMUSCULAR | Status: DC | PRN
  Administered 2015-03-02: 2 mg via INTRAVENOUS

## 2015-03-02 MED ORDER — LIDOCAINE HCL (CARDIAC) 20 MG/ML IV SOLN
INTRAVENOUS | Status: AC
  Filled 2015-03-02: qty 10

## 2015-03-02 MED ORDER — ONDANSETRON HCL 4 MG/2ML IJ SOLN: 4.0000 mg | Freq: Once | INTRAMUSCULAR | Status: DC | PRN

## 2015-03-02 MED ORDER — HYDROCODONE-ACETAMINOPHEN 5-325 MG PO TABS: 1.0000 | ORAL_TABLET | Freq: Four times a day (QID) | ORAL | Status: DC | PRN

## 2015-03-02 MED ORDER — PHENYLEPHRINE HCL 10 MG/ML IJ SOLN
INTRAMUSCULAR | Status: DC | PRN
  Administered 2015-03-02: 120 ug via INTRAVENOUS
  Administered 2015-03-02: 160 ug via INTRAVENOUS
  Administered 2015-03-02: 120 ug via INTRAVENOUS

## 2015-03-02 MED ORDER — PROPOFOL 10 MG/ML IV BOLUS
INTRAVENOUS | Status: DC | PRN
  Administered 2015-03-02: 200 mg via INTRAVENOUS

## 2015-03-02 MED ORDER — HYDROMORPHONE HCL 1 MG/ML IJ SOLN: 0.2500 mg | INTRAMUSCULAR | Status: DC | PRN

## 2015-03-02 MED ORDER — ROCURONIUM BROMIDE 50 MG/5ML IV SOLN
INTRAVENOUS | Status: AC
  Filled 2015-03-02: qty 1

## 2015-03-02 MED ORDER — BUPIVACAINE-EPINEPHRINE (PF) 0.5% -1:200000 IJ SOLN
INTRAMUSCULAR | Status: AC
  Filled 2015-03-02: qty 30

## 2015-03-02 MED ORDER — 0.9 % SODIUM CHLORIDE (POUR BTL) OPTIME
TOPICAL | Status: DC | PRN
  Administered 2015-03-02 (×4): 1000 mL

## 2015-03-02 MED ORDER — ONDANSETRON HCL 4 MG/2ML IJ SOLN
INTRAMUSCULAR | Status: DC | PRN
Start: 2015-03-02 — End: 2015-03-02
  Administered 2015-03-02: 4 mg via INTRAVENOUS

## 2015-03-02 MED ORDER — FENTANYL CITRATE (PF) 250 MCG/5ML IJ SOLN
INTRAMUSCULAR | Status: DC | PRN
  Administered 2015-03-02: 100 ug via INTRAVENOUS

## 2015-03-02 MED ORDER — LIDOCAINE HCL 4 % MT SOLN
OROMUCOSAL | Status: DC | PRN
  Administered 2015-03-02: 4 mL via TOPICAL

## 2015-03-02 SURGICAL SUPPLY — 40 items
CANISTER SUCTION 2500CC (MISCELLANEOUS) ×4 IMPLANT
CHLORAPREP W/TINT 26ML (MISCELLANEOUS) ×2 IMPLANT
COVER SURGICAL LIGHT HANDLE (MISCELLANEOUS) ×2 IMPLANT
DRAIN CHANNEL 19F RND (DRAIN) ×2 IMPLANT
DRAPE PED LAPAROTOMY (DRAPES) ×4 IMPLANT
DRAPE UTILITY XL STRL (DRAPES) IMPLANT
ELECT CAUTERY BLADE 6.4 (BLADE) ×2 IMPLANT
ELECT REM PT RETURN 9FT ADLT (ELECTROSURGICAL) ×2
ELECTRODE REM PT RTRN 9FT ADLT (ELECTROSURGICAL) ×1 IMPLANT
EVACUATOR SILICONE 100CC (DRAIN) ×2 IMPLANT
GAUZE SPONGE 4X4 16PLY XRAY LF (GAUZE/BANDAGES/DRESSINGS) ×2 IMPLANT
GLOVE SURG SIGNA 7.5 PF LTX (GLOVE) ×2 IMPLANT
GOWN STRL REUS W/ TWL LRG LVL3 (GOWN DISPOSABLE) ×1 IMPLANT
GOWN STRL REUS W/ TWL XL LVL3 (GOWN DISPOSABLE) ×1 IMPLANT
GOWN STRL REUS W/TWL LRG LVL3 (GOWN DISPOSABLE) ×1
GOWN STRL REUS W/TWL XL LVL3 (GOWN DISPOSABLE) ×1
KIT BASIN OR (CUSTOM PROCEDURE TRAY) ×2 IMPLANT
KIT ROOM TURNOVER OR (KITS) ×2 IMPLANT
LIQUID BAND (GAUZE/BANDAGES/DRESSINGS) IMPLANT
NEEDLE HYPO 25X1 1.5 SAFETY (NEEDLE) ×2 IMPLANT
NS IRRIG 1000ML POUR BTL (IV SOLUTION) ×8 IMPLANT
PACK SURGICAL SETUP 50X90 (CUSTOM PROCEDURE TRAY) ×2 IMPLANT
PAD ARMBOARD 7.5X6 YLW CONV (MISCELLANEOUS) ×2 IMPLANT
PENCIL BUTTON HOLSTER BLD 10FT (ELECTRODE) ×2 IMPLANT
SPECIMEN JAR SMALL (MISCELLANEOUS) ×2 IMPLANT
SPONGE GAUZE 4X4 12PLY STER LF (GAUZE/BANDAGES/DRESSINGS) ×2 IMPLANT
SPONGE LAP 18X18 X RAY DECT (DISPOSABLE) ×4 IMPLANT
STAPLER VISISTAT 35W (STAPLE) ×2 IMPLANT
SUT ETHILON 2 0 FS 18 (SUTURE) ×2 IMPLANT
SUT MNCRL AB 4-0 PS2 18 (SUTURE) ×2 IMPLANT
SUT VIC AB 3-0 SH 27 (SUTURE) ×1
SUT VIC AB 3-0 SH 27XBRD (SUTURE) ×1 IMPLANT
SWAB COLLECTION DEVICE MRSA (MISCELLANEOUS) ×2 IMPLANT
SYR BULB 3OZ (MISCELLANEOUS) ×2 IMPLANT
SYR CONTROL 10ML LL (SYRINGE) ×2 IMPLANT
TAPE CLOTH SURG 4X10 WHT LF (GAUZE/BANDAGES/DRESSINGS) ×2 IMPLANT
TOWEL OR 17X24 6PK STRL BLUE (TOWEL DISPOSABLE) ×2 IMPLANT
TOWEL OR 17X26 10 PK STRL BLUE (TOWEL DISPOSABLE) ×2 IMPLANT
TUBE CONNECTING 12X1/4 (SUCTIONS) ×2 IMPLANT
YANKAUER SUCT BULB TIP NO VENT (SUCTIONS) ×2 IMPLANT

## 2015-03-02 NOTE — H&P (Signed)
[name]. Rooke 01/19/2015 4:18 PM Location: [facility] Patient #: [id number] DOB: [birth year] Divorced / Language: [name] / Race: White Male  History of Present Illness  Patient words: abd wound.   The patient is a 69 year old male who presents with an abdominal mass.  His PCP is Dr. Barnet Glasgow. He comes by himself.   The patient is morbidly obese. He has been overweight much of his adult life. He underwent a vertical banded gastroplasty (and cholecystectomy) about 2000 at [facility] by Dr. Buddy Duty. About 2 years later he was operated on for an abdominal wall hernia repaired with mesh. This was done at [facility], but is unsure the physician who did the surgery. His wieght was around 500 - 600 pounds with his initial vertical Banded gastroplasty. He thinks he lost about 100 pounds after the vertical banded gastroplasty. But then slowly gained the weight back. Over the last 5 years he thinks he has lost 200-300 pounds intentionally. His current weight is 336 pounds with a BMI of 51. As he has lost weight he has felt a mass along his belt line near his umbilicus. This corresponds to where his abdominal wall hernia was repaired. He had an abdominal CT scan on 12 July 2014 that showed a 7.4 x 3.5 cm fluid collection in his anterior abdominal wall. I think the CT scan was actually done to evaluate his aorta.  Because of persistent abdominal pain, he had another CT scan on 01/09/2015 - this showed the same 7.9 x 3.8 cm fluid collection in his anterior abdominal wall. 2. Stable a biliary duct dilatation.  I discussed with the patient about removing/excising this cyst/fluid collection. It is unclear to me how it relates to his prior abdominal wall hernia repair, but there is probably mesh involved in some way. It does not appear infected I think because of its location in his discomfort is reasonable try to remove. The risk of surgery  include bleeding, infection, recurrence of the cyst, hernia formation, infection of the mesh, and unforeseen complications related to his size. With his size and sleep apnea, I would plan to keep him over night.  Past Medical History: 1. HTN He has had some trouble with low BP recently and has seen Dr. Court Joy  2. 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 Alusio. 09/2005 - left hip arthroplasty - F. Alusio 3. OSA On CPAP since the 1990's 4. Sees Dr. Watt Climes for GI His last colonoscopy and upper endo were about 2 years ago by Dr. Watt Climes 5. Prior history of being seen at the pain clinic 6. Morbid obesity  Social History:  Divorced. 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 Oral Surgery Resection of Stomach  Diagnostic Studies History Marjean Donna, CMA; 01/19/2015 4:19 PM) Colonoscopy 1-5 years ago  Allergies Davy Pique Bynum, CMA; 01/19/2015 4:20 PM) Bee Pollen *NUTRIENTS*  Medication History (Sonya Bynum, CMA; 01/19/2015 4:22 PM) AmLODIPine Besylate (5MG  Tablet, Oral) Active. Aspirin EC (81MG  Tablet DR, Oral) Active. Flexeril (10MG  Tablet, Oral) Active. Furosemide (20MG  Tablet, Oral) Active. Magnesium Gluconate (500MG  Tablet, Oral) Active. Multiple Vitamin (Oral) Active. Omeprazole (40MG  Capsule DR, Oral) Active.  Psyllium (Oral) Active. Testosterone Active. Effexor XR (150MG  Capsule ER 24HR, Oral) Active. Medications Reconciled  Social History Marjean Donna, CMA; 01/19/2015 4:19 PM) Alcohol use Occasional alcohol use. Illicit drug use Remotely  quit drug use. Tobacco use Former smoker.  Family History Marjean Donna, CMA; 01/19/2015 4:19 PM) Alcohol Abuse Father, Mother. Anesthetic complications Mother. Cancer Family Members In General. Hypertension Father. Respiratory Condition Mother.  Review of Systems ([place]; 01/19/2015 4:19 PM) General Present- Fatigue. Not Present- Appetite Loss, Chills, Fever, Night Sweats, Weight Gain and Weight Loss. Skin Present- Rash. Not Present- Change in Wart/Mole, Dryness, Hives, Jaundice, New Lesions, Non-Healing Wounds and Ulcer. HEENT Present- Wears glasses/contact lenses. Not Present- Earache, Hearing Loss, Hoarseness, Nose Bleed, Oral Ulcers, Ringing in the Ears, Seasonal Allergies, Sinus Pain, Sore Throat, Visual Disturbances and Yellow Eyes. Respiratory Not Present- Bloody sputum, Chronic Cough, Difficulty Breathing, Snoring and Wheezing. Cardiovascular Present- Shortness of Breath. Not Present- Chest Pain, Difficulty Breathing Lying Down, Leg Cramps, Palpitations, Rapid Heart Rate and Swelling of Extremities. Gastrointestinal Present- Abdominal Pain. Not Present- Bloating, Bloody Stool, Change in Bowel Habits, Chronic diarrhea, Constipation, Difficulty Swallowing, Excessive gas, Gets full quickly at meals, Hemorrhoids, Indigestion, Nausea, Rectal Pain and Vomiting. Male Genitourinary Present- Frequency and Urgency. Not Present- Blood in Urine, Change in Urinary Stream, Impotence, Nocturia, Painful Urination and Urine Leakage.   Vitals (Sonya Bynum CMA; 01/19/2015 4:19 PM) 01/19/2015 4:19 PM Weight: 336 lb Height: 68in Body Surface Area: 2.7 m Body Mass Index: 51.09 kg/m Temp.: 97.1F(Temporal)  Pulse: 90 (Regular)  BP: 144/84 (Sitting, Left Arm, Standard)  Physical Exam  General: Obese WM alert. He uses a cane to walk. Can get on the exam table with some difficulty. HEENT: Normal. Pupils equal.  Neck: Supple. No mass. No thyroid mass. Lymph Nodes: No  supraclavicular or cervical nodes.  Lungs: Clear to auscultation and symmetric breath sounds. Heart: RRR. No murmur or rub.  Abdomen: Soft. No hernia. Normal bowel sounds. He has a 7 x 9 cm mass just above his umbilicus. He is so large, it is difficult to get the exact demensions. It does not feel like a hernia. And is consistent with the fluid filled mass seen on CT scan. He has a significant pannus of redundant abdominal skin.  Extremities: Walks with a cane.  Assessment & Plan  1.  ABDOMINAL WALL MASS (789.30  R19.00)  Current Plans - Schedule for Surgery  2.  MORBID OBESITY WITH BMI OF 50.0-59.9, ADULT (V85.43  Z68.43) 3.  SLEEP APNEA IN ADULT ([phone])  Alphonsa Overall, MD, [facility] Pager: [phone] Office phone:  [phone]

## 2015-03-02 NOTE — Discharge Instructions (Signed)
[facility] - DISCHARGE INSTRUCTIONS TO PATIENT  Activity:  Driving - May drive when taking no pain meds - 2 or 3 days   Lifting - Take it easy for 7 days  Wound Care:   Leave bandage for 2 days, then may remove and shower  Diet:  As tolerated  Follow up appointment:  Call Dr. Pollie Friar office [facility]) at [phone] for an appointment in 7 - 10 days  Medications and dosages:  Resume your home medications.  You have a prescription for:  Vicodin  Call Dr. Lucia Gaskins or his office  [phone]) if you have:  Temperature greater than 100.4,  Persistent nausea and vomiting,  Severe uncontrolled pain,  Redness, tenderness, or signs of infection (pain, swelling, redness, odor or green/yellow discharge around the site),  Difficulty breathing, headache or visual disturbances,  Any other questions or concerns you may have after discharge.  In an emergency, call 911 or go to an Emergency Department at a nearby hospital.

## 2015-03-02 NOTE — Transfer of Care (Signed)
Immediate Anesthesia Transfer of Care Note  Patient: [name]  Procedure(s) Performed: Procedure(s): EXCISION ABDOMINAL WALL MASS (N/A)  Patient Location: PACU  Anesthesia Type:General  Level of Consciousness: awake, alert  and oriented  Airway & Oxygen Therapy: Patient Spontanous Breathing  Post-op Assessment: Report given to RN and Post -op Vital signs reviewed and stable  Post vital signs: Reviewed and stable  Last Vitals:  Filed Vitals:   03/02/15 0830  BP: 132/63  Pulse: 87  Temp: 36.3 C  Resp: 14    Complications: No apparent anesthesia complications

## 2015-03-02 NOTE — Anesthesia Postprocedure Evaluation (Signed)
  Anesthesia Post-op Note  Patient: [name]  Procedure(s) Performed: Procedure(s): EXCISION ABDOMINAL WALL MASS (N/A)  Patient Location: PACU  Anesthesia Type:General  Level of Consciousness: awake, alert  and oriented  Airway and Oxygen Therapy: Patient Spontanous Breathing and Patient connected to nasal cannula oxygen  Post-op Pain: mild  Post-op Assessment: Post-op Vital signs reviewed, Patient's Cardiovascular Status Stable, Respiratory Function Stable, Patent Airway and Pain level controlled              Post-op Vital Signs: stable  Last Vitals:  Filed Vitals:   03/02/15 0915  BP: 102/86  Pulse:   Temp: 36.2 C  Resp: 16    Complications: No apparent anesthesia complications

## 2015-03-02 NOTE — Anesthesia Procedure Notes (Signed)
Procedure Name: Intubation Date/Time: 03/02/2015 7:24 AM Performed by: Tamala Fothergill S Preoxygenation: Pre-oxygenation with 100% oxygen Intubation Type: IV induction and Cricoid Pressure applied Laryngoscope Size: Miller and 2 Grade View: Grade II Tube type: Oral Tube size: 7.5 mm Number of attempts: 1 Airway Equipment and Method: LTA kit utilized Tube secured with: Tape Dental Injury: Teeth and Oropharynx as per pre-operative assessment

## 2015-03-02 NOTE — Anesthesia Preprocedure Evaluation (Addendum)
Anesthesia Evaluation  Patient identified by MRN, date of birth, ID band Patient awake    Reviewed: Allergy & Precautions, NPO status , Patient's Chart, lab work & pertinent test results  Airway Mallampati: II  TM Distance: >3 FB Neck ROM: Full    Dental  (+) Teeth Intact, Dental Advisory Given   Pulmonary former smoker,  breath sounds clear to auscultation        Cardiovascular hypertension, Rhythm:Regular Rate:Normal     Neuro/Psych    GI/Hepatic   Endo/Other    Renal/GU      Musculoskeletal   Abdominal   Peds  Hematology   Anesthesia Other Findings   Reproductive/Obstetrics                            Anesthesia Physical Anesthesia Plan  ASA: III  Anesthesia Plan: General   Post-op Pain Management:    Induction: Intravenous  Airway Management Planned: Oral ETT  Additional Equipment:   Intra-op Plan:   Post-operative Plan:   Informed Consent: I have reviewed the patients History and Physical, chart, labs and discussed the procedure including the risks, benefits and alternatives for the proposed anesthesia with the patient or authorized representative who has indicated his/her understanding and acceptance.   Dental advisory given  Plan Discussed with: CRNA and Anesthesiologist  Anesthesia Plan Comments:         Anesthesia Quick Evaluation

## 2015-03-03 NOTE — Op Note (Signed)
NAMEMOHAMAD, BRUSO                 ACCOUNT NO.:  [id number]  MEDICAL RECORD NO.:  [id number]  LOCATION:  [facility]                         FACILITY:  [facility]  PHYSICIAN:  Fenton Malling. Lucia Gaskins, M.D.  DATE OF BIRTH:  01/11/1946  DATE OF PROCEDURE:  03/02/2015                              OPERATIVE REPORT   PREOPERATIVE DIAGNOSIS:  Abdominal wall mass measuring approximately 8 x 5 cm.  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  PROCEDURE:  Excision of abdominal wall mass.  SURGEON:  Fenton Malling. Lucia Gaskins, M.D.  FIRST ASSISTANT:  None.  ANESTHESIA:  General endotracheal supervised Dr. Roberts Gaudy.  ESTIMATED BLOOD LOSS:  Minimal.  DRAINS:  Left in was a 19-French Blake drain.  LOCAL ANESTHETIC:  None.  SPECIMEN:  This 8 x 5 cm chronic cyst which was sent to Pathology.  INDICATION FOR PROCEDURE:  Mr. Schriver is a 69 year old white male who sees Dr. [name] as his primary care doctor.  He has had for at least a couple of years a palpable mass in his mid abdomen.  He had a CT scan in November of 2015 which showed this mass to be 7.4 x 3.5 cm fluid collection in the anterior abdominal wall.  CAT scan was repeated on Jan 09, 2015, showing this approximately 7.9 x 3.8 cm fluid collection.  The patient has complained of pain in this area and my plan will be to excise this.  Significantly, he underwent a vertical banded gastroplasty by Dr. Buddy Duty in 2000 for morbid obesity.  At that time, he thought he weighed around 500 pounds.  He has lost down to the mid 300 pounds and has successfully loss of 150 pounds.  He also apparently at some point, he says had an abdominal wall hernia repair, exact timing and what was used for that repair is unknown.  OPERATIVE NOTE:  The patient was taken to room #7 at [facility] where he underwent general endotracheal anesthetic supervised by Dr. Roberts Gaudy.  Abdomen was prepped with ChloraPrep, was given 3 g of Ancef at the initiation of  procedure.  A surgical checklist was run and time-out was held at the initiation of the procedure.  His abdomen was sterilely draped.  I could palpate this mass which was about 9 cm in diameter, right at the area of his umbilicus.  I made an incision approximately 14 cm in length and went down and got around this mass.  I got into the mass.  In the mass there was thick pastey material, a mixture ranging from thinner paste to a kind of cottage cheese material.  It did not smell.  It did not look infected.  I obtained cultures for both aerobes and anaerobes of it.  [Note: these came back negative]  The mass had a chronic wall with necrotic debris.  The reason for the mass is unclear to me.   I did remove some suture material.  I tried to remove the entire wall of the cyst.  This was sent to Pathology.  The footprint of the mass on the fascial abdominal wall was about 5 x 8 cm.  I did  not think he had any full fascial defects, but this could thin out and cause a hernia with time..    I irrigated the wound with 4 L of saline.  The wound was very clean, and there was no debris left behind.  I placed a 19-French Keenan Bachelor to a stab wound to the left of incision, and then closed the incision in layers with 2-0 Vicryl sutures deep and superficial and closed the skin with 2-0 nylon sutures spaced about 1.5 cm apart.  The wound was then sterilely dressed.  The patient was transferred to the recovery room in good condition.  Because this did not go into the fascia and did not go into the abdominal cavity, I think he could go home today with pain medicine, see me back in 1 week for possible drain removal and then I will leave the sutures in for 2 weeks.   Fenton Malling. Lucia Gaskins, M.D., [facility], scribe for Epic   DHN/MEDQ  D:  03/02/2015  T:  03/03/2015  Job:  [id number]  cc:   Margarita Rana, M.D.

## 2015-03-06 ENCOUNTER — Encounter (HOSPITAL_COMMUNITY): Payer: Self-pay | Admitting: Surgery

## 2015-03-06 LAB — WOUND CULTURE
Culture: NO GROWTH
Gram Stain: NONE SEEN

## 2015-03-07 LAB — ANAEROBIC CULTURE: Gram Stain: NONE SEEN

## 2017-02-10 DIAGNOSIS — K219 Gastro-esophageal reflux disease without esophagitis: Secondary | ICD-10-CM | POA: Diagnosis present

## 2017-04-30 DIAGNOSIS — M47812 Spondylosis without myelopathy or radiculopathy, cervical region: Secondary | ICD-10-CM

## 2017-04-30 HISTORY — DX: Spondylosis without myelopathy or radiculopathy, cervical region: M47.812

## 2017-11-23 ENCOUNTER — Encounter (INDEPENDENT_AMBULATORY_CARE_PROVIDER_SITE_OTHER): Payer: Self-pay

## 2017-11-23 ENCOUNTER — Encounter: Payer: Self-pay | Admitting: Neurology

## 2017-11-23 ENCOUNTER — Ambulatory Visit: Payer: Medicare Other | Admitting: Neurology

## 2017-11-23 VITALS — BP 163/80 | HR 81 | Ht 68.5 in | Wt 341.0 lb

## 2017-11-23 DIAGNOSIS — R7982 Elevated C-reactive protein (CRP): Secondary | ICD-10-CM

## 2017-11-23 DIAGNOSIS — R51 Headache: Secondary | ICD-10-CM | POA: Diagnosis not present

## 2017-11-23 DIAGNOSIS — G4489 Other headache syndrome: Secondary | ICD-10-CM

## 2017-11-23 DIAGNOSIS — R700 Elevated erythrocyte sedimentation rate: Secondary | ICD-10-CM | POA: Diagnosis not present

## 2017-11-23 DIAGNOSIS — R519 Headache, unspecified: Secondary | ICD-10-CM

## 2017-11-23 NOTE — Patient Instructions (Addendum)
Please follow up with your sleep specialist and stay fully compliant with your CPAP.  Please reduce your caffeine intake and increase your water intake to 6-8 cups per day.   We will look for an inflammatory or structural cause for your headache by doing blood work today and scheduling a brain MRI with and without contrast.   I would like for you to see an ophthalmologist for a formal eye exam. I will make a referral.    Please remember, common headache triggers are: sleep deprivation, dehydration, overheating, stress, hypoglycemia or skipping meals and blood sugar fluctuations, excessive pain medications or excessive alcohol use or caffeine withdrawal. Some people have food triggers such as aged cheese, orange juice or chocolate, especially dark chocolate, or MSG (monosodium glutamate). Try to avoid these headache triggers as much possible. It may be helpful to keep a headache diary to figure out what makes your headaches worse or brings them on and what alleviates them. Some people report headache onset after exercise but studies have shown that regular exercise may actually prevent headaches from coming. If you have exercise-induced headaches, please make sure that you drink plenty of fluid before and after exercising and that you do not over do it and do not overheat.  If your test results are benign, please follow up with primary care and pain management.
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
## 2017-11-24 ENCOUNTER — Telehealth: Payer: Self-pay | Admitting: Neurology

## 2017-11-24 ENCOUNTER — Telehealth: Payer: Self-pay

## 2017-11-24 LAB — SEDIMENTATION RATE: Sed Rate: 107 mm/hr — ABNORMAL HIGH (ref 0–30)

## 2017-11-24 LAB — C-REACTIVE PROTEIN: CRP: 23.6 mg/L — ABNORMAL HIGH (ref 0.0–4.9)

## 2017-11-24 LAB — ANA W/REFLEX: Anti Nuclear Antibody(ANA): NEGATIVE

## 2017-11-24 NOTE — Telephone Encounter (Signed)
I called pt to discuss. No answer, left a message asking him to call me back. 

## 2017-11-24 NOTE — Telephone Encounter (Signed)
I spoke to Dola at [facility] she stated that they can not do any contrast with Medicare insurance there.. Do you know if the patient requested it there?

## 2017-11-24 NOTE — Progress Notes (Signed)
Inflammatory markers are high, I am not sure due to what condition. Given his complicated Hx it may be difficult to tease out. He is advised to FU with PCP on this. I will make a referral to rheumatology in the interim. He may need to see a headache specialist too. We will await MRI too. Please expedite referral to ophthalmology as well. Please forward result to PCP.  [name], MD, PhD [facility])

## 2017-11-24 NOTE — Telephone Encounter (Signed)
-----   Message from [name], MD sent at 11/24/2017  8:03 AM EDT ----- Inflammatory markers are high, I am not sure due to what condition. Given his complicated Hx it may be difficult to tease out. He is advised to FU with PCP on this. I will make a referral to rheumatology in the interim. He may need to see a headache specialist too. We will await MRI too. Please expedite referral to ophthalmology as well. Please forward result to PCP.  [name], MD, PhD [facility])

## 2017-11-24 NOTE — Addendum Note (Signed)
Addended by: [name] on: 11/24/2017 08:03 AM   Modules accepted: Orders

## 2017-11-25 NOTE — Telephone Encounter (Signed)
See telephone note from 11/25/17.

## 2017-11-25 NOTE — Telephone Encounter (Signed)
Pt returned my call. I explained his lab work and Dr. Guadelupe Sabin recommendations. Pt reports that he has an appt with Dr. Katy Fitch in ophthalmology scheduled. Pt is agreeable to seeing a rheumatologist and I advised him that we have sent his referral to Dr. Estanislado Pandy. Pt says that he has no preference on where the MRI is performed and is agreeable to it being in [place]. Pt understands that our office will coordinate his MRI and where he can have it. Pt asked that I send his lab results to Dr. Edrick Oh. Pt verbalized understanding of results.

## 2017-11-25 NOTE — Telephone Encounter (Signed)
Noted, thank you I will send the order to GI. They will reach out to the patient to schedule.

## 2017-11-25 NOTE — Telephone Encounter (Signed)
I called pt again to discuss his results. No answer, left a message asking him to call me back. 

## 2017-11-30 ENCOUNTER — Telehealth: Payer: Self-pay | Admitting: Neurology

## 2017-11-30 NOTE — Telephone Encounter (Signed)
[name] called Patient has refused his apt . Patient refused urgent appointment so referral has been declined per Dr. Estanislado Pandy .

## 2017-11-30 NOTE — Telephone Encounter (Signed)
I called pt. He reports that Dr. Arlean Hopping office only offered him one appointment time, which he could not take, because he has a surgical procedure for his knees scheduled at that time. Pt reports that Dr. Arlean Hopping office would not offer him any other appointment times. Pt does want to see a rheumatologist, but is asking that we send him to another rheumatologist office.

## 2017-11-30 NOTE — Telephone Encounter (Signed)
Noted done .  Patient had conflict with already scheduled apt's patient wanted another doctor's office patient being  [facility] . I spoke to Patient he is aware.

## 2017-11-30 NOTE — Telephone Encounter (Signed)
Kristen: Please inquire as to why patient refused appt.  I would like to be able to help him, but he will have to work with [place].   Hinton Dyer: can we resend referral to another practice? Not sure, what else to do.

## 2017-11-30 NOTE — Telephone Encounter (Signed)
[name] , Can you try to get him to [facility] and let me know because they were getting him worked him pretty quick. I scared If I send him somewhere else he will refuse.

## 2017-12-08 ENCOUNTER — Ambulatory Visit
Admission: RE | Admit: 2017-12-08 | Discharge: 2017-12-08 | Disposition: A | Discharge status: Home / Self Care | Payer: Medicare Other | Source: Ambulatory Visit | Attending: Neurology | Admitting: Neurology

## 2017-12-08 DIAGNOSIS — G4489 Other headache syndrome: Secondary | ICD-10-CM

## 2017-12-08 MED ORDER — GADOBENATE DIMEGLUMINE 529 MG/ML IV SOLN
20.0000 mL | Freq: Once | INTRAVENOUS | Status: AC | PRN
  Administered 2017-12-08: 20 mL via INTRAVENOUS

## 2017-12-10 ENCOUNTER — Telehealth: Payer: Self-pay | Admitting: Diagnostic Neuroimaging

## 2017-12-10 DIAGNOSIS — G4489 Other headache syndrome: Secondary | ICD-10-CM

## 2017-12-10 NOTE — Telephone Encounter (Signed)
I called patient and reviewed scan results and symptoms.   Patient reports sudden onset headaches lasting hours (Dec 2018, then Feb 2019 and Apr 2019). Also lower level headaches intermittently. Overall headaches are improving in last few weeks.  Headaches mainly over left temporal region.  No vision loss symptoms.  No nausea or vomiting.  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  PLAN: - repeat MRI brain and pituitary protocol in 6 weeks - check pituitary hormone evaluation - monitor symptoms; if sudden increase in headache, numbness, weakness, vision changes or any other neurologic symptoms, I advised patient to go to emergency room  Orders Placed This Encounter  Procedures  . MR BRAIN W WO CONTRAST  . Prolactin  . Follicle Stimulating Hormone  . Luteinizing Hormone  . Testosterone  . Growth hormone  . Insulin-Like Growth Factor  . TSH  . T4, Free  . UA/M w/rflx Culture, Routine     Penni Bombard, MD 6/78/9381, 0:17 PM Certified in Neurology, Neurophysiology and Neuroimaging  [facility] [address] [place], [place] [address] [phone]

## 2017-12-11 ENCOUNTER — Telehealth: Payer: Self-pay | Admitting: Diagnostic Neuroimaging

## 2017-12-11 NOTE — Telephone Encounter (Signed)
Spoke with patient to advise him that he does not need appointment for his labs. Orders are in; he comes when office is open. Gave him office hours, advised he not come between 12- 1 pm. He stated he will come early next week, verbalized understanding, appreciation of call.

## 2017-12-11 NOTE — Telephone Encounter (Signed)
Repeat this MRI Brain in 6 weeks order sent to GI. No auth they will reach out to the pt to schedule.

## 2017-12-17 ENCOUNTER — Encounter (HOSPITAL_COMMUNITY): Payer: Self-pay | Admitting: *Deleted

## 2017-12-17 ENCOUNTER — Other Ambulatory Visit: Payer: Self-pay | Admitting: General Surgery

## 2017-12-24 NOTE — Patient Instructions (Signed)
HUSAIN COSTABILE  12/24/2017   Your procedure is scheduled on: 12/30/2017   Report to [facility] Main  Entrance  Report to admitting at   12 noon    Call this number if you have problems the morning of surgery [phone]   Remember: Do not eat food :After Midnight.      Take these medicines the morning of surgery with A SIP OF WATER: Amlodipine ( Norvasc), Eye drops as usual, Neurontin, Methadone, Vesicare, Effexor, Prilosec                                 You may not have any metal on your body including hair pins and              piercings  Do not wear jewelry,  lotions, powders or perfumes, deodorant          .              Men may shave face and neck.   Do not bring valuables to the hospital. [place].  Contacts, dentures or bridgework may not be worn into surgery.       Patients discharged the day of surgery will not be allowed to drive home.  Name and phone number of your driver:                Please read over the following fact sheets you were given: _____________________________________________________________________             NO SOLID FOOD AFTER MIDNIGHT THE NIGHT PRIOR TO SURGERY. NOTHING BY MOUTH EXCEPT CLEAR LIQUIDS UNTIL 3 HOURS PRIOR TO [place] SURGERY. PLEASE FINISH ENSURE DRINK PER SURGEON ORDER 3 HOURS PRIOR TO SCHEDULED SURGERY TIME WHICH NEEDS TO BE COMPLETED AT ____1100am________.   CLEAR LIQUID DIET   Foods Allowed                                                                     Foods Excluded  Coffee and tea, regular and decaf                             liquids that you cannot  Plain Jell-O in any flavor                                             see through such as: Fruit ices (not with fruit pulp)                                     milk, soups, orange juice  Iced Popsicles  All solid food Carbonated beverages, regular and diet                                     Cranberry, grape and apple juices Sports drinks like Gatorade Lightly seasoned clear broth or consume(fat free) Sugar, honey syrup  Sample Menu Breakfast                                Lunch                                     Supper Cranberry juice                    Beef broth                            Chicken broth Jell-O                                     Grape juice                           Apple juice Coffee or tea                        Jell-O                                      Popsicle                                                Coffee or tea                        Coffee or tea  _____________________________________________________________________  [facility] - Preparing for Surgery Before surgery, you can play an important role.  Because skin is not sterile, your skin needs to be as free of germs as possible.  You can reduce the number of germs on your skin by washing with CHG (chlorahexidine gluconate) soap before surgery.  CHG is an antiseptic cleaner which kills germs and bonds with the skin to continue killing germs even after washing. Please DO NOT use if you have an allergy to CHG or antibacterial soaps.  If your skin becomes reddened/irritated stop using the CHG and inform your nurse when you arrive at Short Stay. Do not shave (including legs and underarms) for at least 48 hours prior to the first CHG shower.  You may shave your face/neck. Please follow these instructions carefully:  1.  Shower with CHG Soap the night before surgery and the  morning of Surgery.  2.  If you choose to wash your hair, wash your hair first as usual with your  normal  shampoo.  3.  After you shampoo, rinse your hair and body thoroughly to remove the  shampoo.  4.  Use CHG as you would any other liquid soap.  You can apply chg directly  to the skin and wash                       Gently with a scrungie or clean washcloth.  5.  Apply  the CHG Soap to your body ONLY FROM THE NECK DOWN.   Do not use on face/ open                           Wound or open sores. Avoid contact with eyes, ears mouth and genitals (private parts).                       Wash face,  Genitals (private parts) with your normal soap.             6.  Wash thoroughly, paying special attention to the area where your surgery  will be performed.  7.  Thoroughly rinse your body with warm water from the neck down.  8.  DO NOT shower/wash with your normal soap after using and rinsing off  the CHG Soap.                9.  Pat yourself dry with a clean towel.            10.  Wear clean pajamas.            11.  Place clean sheets on your bed the night of your first shower and do not  sleep with pets. Day of Surgery : Do not apply any lotions/deodorants the morning of surgery.  Please wear clean clothes to the hospital/surgery center.  FAILURE TO FOLLOW THESE INSTRUCTIONS MAY RESULT IN THE CANCELLATION OF YOUR SURGERY PATIENT SIGNATURE_________________________________  NURSE SIGNATURE__________________________________  ________________________________________________________________________

## 2017-12-25 ENCOUNTER — Inpatient Hospital Stay (HOSPITAL_COMMUNITY): Admission: RE | Admit: 2017-12-25 | Payer: Medicare Other | Source: Ambulatory Visit

## 2017-12-25 NOTE — H&P (Signed)
[name] Location: [facility] Surgery Patient #: [id number] DOB: 02-15-1946 Divorced / Language: [name] / Race: White Male        History of Present Illness      This is a 72 year old man, referred by Dr. Warden Fillers from ophthamology for consideration of left temporal artery biopsy. Dr. Edrick Oh is his PCP. He is followed at [facility] and [place] [facility] for chronic pain syndrome      He says some recent left sided facial symptoms and some deterioration of his vision. Sedimentation rate is 107 and CRP 23.6 months notably elevated. He was started on oral prednisone and omeprazole. His left facial symptoms have resolved. 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 The significance of that is not clear.      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 GERD. Hypertension. BMI 52. Mood disorder. Family history reveals mother died of COPD lung cancer and alcohol. Father died of alcoholic abuse Social history reveals he is divorced. 2 children. quit smoking 35 years ago. Denies alcohol. Retired. Used to be a Music therapist    He will be scheduled for left temporal artery biopsy under monitored sedation. I told him we will have to clip the hair on the left side of his face. I discussed the indications, details, techniques, and risk of the surgery with him. He is aware of the risk of bleeding, infection, most notably infrequent but possible facial nerve damage. He is aware that the biopsy may or may not show cell arteritis. I told him it was clearly indicated given the history. He understands all these issues. All of his questions are answered. He agrees with this plan.  Doppler and alm retractor requested   Allergies  Bee Pollen *NUTRIENTS*  Allergies Reconciled   Medication History  AmLODIPine Besylate (5MG  Tablet, Oral) Active. Aspirin EC (81MG  Tablet DR, Oral) Active. Flexeril  (10MG  Tablet, Oral) Active. Furosemide (20MG  Tablet, Oral) Active. Magnesium Gluconate (500MG  Tablet, Oral) Active. Multiple Vitamin (Oral) Active. Omeprazole (40MG  Capsule DR, Oral) Active. Psyllium (Oral) Active. Testosterone Active. Effexor XR (150MG  Capsule ER 24HR, Oral) Active. Medications Reconciled  Vitals  Weight: 344.5 lb Height: 68in Body Surface Area: 2.58 m Body Mass Index: 52.38 kg/m  Temp.: 98.28F(Oral)  Pulse: 83 (Regular)    Physical Exam  General Mental Status-Alert. General Appearance-Consistent with stated age. Hydration-Well hydrated. Voice-Normal. Note: Impaired mobility due to obesity. Walks with a cane   Head and Neck Head-normocephalic, atraumatic with no lesions or palpable masses. Trachea-midline. Thyroid Gland Characteristics - normal size and consistency. Note: Right temporal artery more easily palpable than left. Full beard.   Eye Eyeball - Bilateral-Extraocular movements intact. Sclera/Conjunctiva - Bilateral-No scleral icterus.  Chest and Lung Exam Chest and lung exam reveals -quiet, even and easy respiratory effort with no use of accessory muscles and on auscultation, normal breath sounds, no adventitious sounds and normal vocal resonance. Inspection Chest Wall - Normal. Back - normal.  Cardiovascular Cardiovascular examination reveals -normal heart sounds, regular rate and rhythm with no murmurs and normal pedal pulses bilaterally.  Abdomen Inspection Inspection of the abdomen reveals - No Hernias. Skin - Scar - no surgical scars. Palpation/Percussion Palpation and Percussion of the abdomen reveal - Soft, Non Tender, No Rebound tenderness, No Rigidity (guarding) and No hepatosplenomegaly. Auscultation Auscultation of the abdomen reveals - Bowel sounds normal.  Neurologic Neurologic evaluation reveals -alert and oriented x 3 with no impairment of recent or remote memory.  Mental  Status-Normal.  Musculoskeletal Normal Exam - Left-Upper Extremity Strength Normal and Lower Extremity Strength Normal. Normal Exam - Right-Upper Extremity Strength Normal and Lower Extremity Strength Normal.  Lymphatic Head & Neck  General Head & Neck Lymphatics: Bilateral - Description - Normal. Axillary  General Axillary Region: Bilateral - Description - Normal. Tenderness - Non Tender. Femoral & Inguinal  Generalized Femoral & Inguinal Lymphatics: Bilateral - Description - Normal. Tenderness - Non Tender.    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 Shirleen Schirmer is consistent with giant cell arteritis Your left-sided facial symptoms have resolved with prednisone  you will be scheduled for left temporal artery biopsy under sedation. 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 [name], M.D., [facility] General and Minimally invasive Surgery Breast and Colorectal Surgery Office:   [phone] Pager:   [phone]

## 2017-12-28 ENCOUNTER — Encounter (HOSPITAL_COMMUNITY): Payer: Self-pay

## 2017-12-28 ENCOUNTER — Other Ambulatory Visit: Payer: Self-pay

## 2017-12-28 ENCOUNTER — Encounter (HOSPITAL_COMMUNITY)
Admission: RE | Admit: 2017-12-28 | Discharge: 2017-12-28 | Disposition: A | Discharge status: Home / Self Care | Payer: Medicare Other | Source: Ambulatory Visit | Attending: General Surgery | Admitting: General Surgery

## 2017-12-28 DIAGNOSIS — Z6841 Body Mass Index (BMI) 40.0 and over, adult: Secondary | ICD-10-CM | POA: Diagnosis not present

## 2017-12-28 DIAGNOSIS — Z811 Family history of alcohol abuse and dependence: Secondary | ICD-10-CM | POA: Diagnosis not present

## 2017-12-28 DIAGNOSIS — G894 Chronic pain syndrome: Secondary | ICD-10-CM | POA: Diagnosis not present

## 2017-12-28 DIAGNOSIS — Z87891 Personal history of nicotine dependence: Secondary | ICD-10-CM | POA: Diagnosis not present

## 2017-12-28 DIAGNOSIS — Z79899 Other long term (current) drug therapy: Secondary | ICD-10-CM | POA: Diagnosis not present

## 2017-12-28 DIAGNOSIS — Z801 Family history of malignant neoplasm of trachea, bronchus and lung: Secondary | ICD-10-CM | POA: Diagnosis not present

## 2017-12-28 DIAGNOSIS — Z91030 Bee allergy status: Secondary | ICD-10-CM | POA: Diagnosis not present

## 2017-12-28 DIAGNOSIS — F39 Unspecified mood [affective] disorder: Secondary | ICD-10-CM | POA: Diagnosis not present

## 2017-12-28 DIAGNOSIS — Z825 Family history of asthma and other chronic lower respiratory diseases: Secondary | ICD-10-CM | POA: Diagnosis not present

## 2017-12-28 DIAGNOSIS — K219 Gastro-esophageal reflux disease without esophagitis: Secondary | ICD-10-CM | POA: Diagnosis not present

## 2017-12-28 DIAGNOSIS — I209 Angina pectoris, unspecified: Secondary | ICD-10-CM | POA: Diagnosis not present

## 2017-12-28 DIAGNOSIS — I10 Essential (primary) hypertension: Secondary | ICD-10-CM | POA: Diagnosis not present

## 2017-12-28 DIAGNOSIS — G4730 Sleep apnea, unspecified: Secondary | ICD-10-CM | POA: Diagnosis not present

## 2017-12-28 DIAGNOSIS — M316 Other giant cell arteritis: Secondary | ICD-10-CM | POA: Diagnosis present

## 2017-12-28 DIAGNOSIS — Z7982 Long term (current) use of aspirin: Secondary | ICD-10-CM | POA: Diagnosis not present

## 2017-12-28 DIAGNOSIS — R51 Headache: Secondary | ICD-10-CM | POA: Diagnosis not present

## 2017-12-28 HISTORY — DX: Headache: R51

## 2017-12-28 HISTORY — DX: Family history of other specified conditions: Z84.89

## 2017-12-28 HISTORY — DX: Dyspnea, unspecified: R06.00

## 2017-12-28 HISTORY — DX: Headache, unspecified: R51.9

## 2017-12-28 NOTE — Progress Notes (Signed)
CBC/DIFF/CMP-12/10/17 on chart  12/09/2017- LOV_ Dr [name] on chart  EKG-12/28/17-epic

## 2017-12-29 MED ORDER — DEXTROSE 5 % IV SOLN
3.0000 g | INTRAVENOUS | Status: AC
  Administered 2017-12-30: 3 g via INTRAVENOUS
  Filled 2017-12-29: qty 3

## 2017-12-30 ENCOUNTER — Encounter (HOSPITAL_COMMUNITY)
Admission: RE | Disposition: A | Discharge status: Home / Self Care | Payer: Self-pay | Source: Ambulatory Visit | Attending: General Surgery

## 2017-12-30 ENCOUNTER — Ambulatory Visit (HOSPITAL_COMMUNITY)
Admission: RE | Admit: 2017-12-30 | Discharge: 2017-12-30 | Disposition: A | Discharge status: Home / Self Care | Payer: Medicare Other | Source: Ambulatory Visit | Attending: General Surgery | Admitting: General Surgery

## 2017-12-30 ENCOUNTER — Ambulatory Visit (HOSPITAL_COMMUNITY): Payer: Medicare Other | Admitting: Anesthesiology

## 2017-12-30 ENCOUNTER — Encounter (HOSPITAL_COMMUNITY): Payer: Self-pay | Admitting: *Deleted

## 2017-12-30 DIAGNOSIS — Z91030 Bee allergy status: Secondary | ICD-10-CM | POA: Insufficient documentation

## 2017-12-30 DIAGNOSIS — M316 Other giant cell arteritis: Secondary | ICD-10-CM | POA: Diagnosis not present

## 2017-12-30 DIAGNOSIS — F39 Unspecified mood [affective] disorder: Secondary | ICD-10-CM | POA: Insufficient documentation

## 2017-12-30 DIAGNOSIS — I209 Angina pectoris, unspecified: Secondary | ICD-10-CM | POA: Insufficient documentation

## 2017-12-30 DIAGNOSIS — Z825 Family history of asthma and other chronic lower respiratory diseases: Secondary | ICD-10-CM | POA: Insufficient documentation

## 2017-12-30 DIAGNOSIS — G894 Chronic pain syndrome: Secondary | ICD-10-CM | POA: Diagnosis not present

## 2017-12-30 DIAGNOSIS — Z6841 Body Mass Index (BMI) 40.0 and over, adult: Secondary | ICD-10-CM | POA: Insufficient documentation

## 2017-12-30 DIAGNOSIS — Z87891 Personal history of nicotine dependence: Secondary | ICD-10-CM | POA: Insufficient documentation

## 2017-12-30 DIAGNOSIS — Z811 Family history of alcohol abuse and dependence: Secondary | ICD-10-CM | POA: Insufficient documentation

## 2017-12-30 DIAGNOSIS — Z7982 Long term (current) use of aspirin: Secondary | ICD-10-CM | POA: Insufficient documentation

## 2017-12-30 DIAGNOSIS — K219 Gastro-esophageal reflux disease without esophagitis: Secondary | ICD-10-CM | POA: Insufficient documentation

## 2017-12-30 DIAGNOSIS — Z79899 Other long term (current) drug therapy: Secondary | ICD-10-CM | POA: Insufficient documentation

## 2017-12-30 DIAGNOSIS — R51 Headache: Secondary | ICD-10-CM | POA: Insufficient documentation

## 2017-12-30 DIAGNOSIS — I10 Essential (primary) hypertension: Secondary | ICD-10-CM | POA: Insufficient documentation

## 2017-12-30 DIAGNOSIS — Z801 Family history of malignant neoplasm of trachea, bronchus and lung: Secondary | ICD-10-CM | POA: Insufficient documentation

## 2017-12-30 DIAGNOSIS — G4730 Sleep apnea, unspecified: Secondary | ICD-10-CM | POA: Insufficient documentation

## 2017-12-30 HISTORY — DX: Other giant cell arteritis: M31.6

## 2017-12-30 HISTORY — PX: ARTERY BIOPSY: SHX891

## 2017-12-30 SURGERY — BIOPSY TEMPORAL ARTERY
Anesthesia: General | Laterality: Left

## 2017-12-30 MED ORDER — CELECOXIB 100 MG PO CAPS
100.0000 mg | ORAL_CAPSULE | ORAL | Status: DC
  Filled 2017-12-30: qty 1

## 2017-12-30 MED ORDER — LIDOCAINE 2% (20 MG/ML) 5 ML SYRINGE
INTRAMUSCULAR | Status: DC | PRN
  Administered 2017-12-30: 100 mg via INTRAVENOUS

## 2017-12-30 MED ORDER — GABAPENTIN 300 MG PO CAPS
300.0000 mg | ORAL_CAPSULE | ORAL | Status: DC
  Filled 2017-12-30: qty 1

## 2017-12-30 MED ORDER — 0.9 % SODIUM CHLORIDE (POUR BTL) OPTIME
TOPICAL | Status: DC | PRN
  Administered 2017-12-30: 1000 mL

## 2017-12-30 MED ORDER — LIDOCAINE 2% (20 MG/ML) 5 ML SYRINGE
INTRAMUSCULAR | Status: AC
  Filled 2017-12-30: qty 5

## 2017-12-30 MED ORDER — DEXAMETHASONE SODIUM PHOSPHATE 10 MG/ML IJ SOLN
INTRAMUSCULAR | Status: AC
  Filled 2017-12-30: qty 1

## 2017-12-30 MED ORDER — ONDANSETRON HCL 4 MG/2ML IJ SOLN
INTRAMUSCULAR | Status: AC
  Filled 2017-12-30: qty 2

## 2017-12-30 MED ORDER — PROPOFOL 10 MG/ML IV BOLUS
INTRAVENOUS | Status: AC
  Filled 2017-12-30: qty 20

## 2017-12-30 MED ORDER — DEXAMETHASONE SODIUM PHOSPHATE 10 MG/ML IJ SOLN
INTRAMUSCULAR | Status: DC | PRN
  Administered 2017-12-30: 10 mg via INTRAVENOUS

## 2017-12-30 MED ORDER — ACETAMINOPHEN 500 MG PO TABS
1000.0000 mg | ORAL_TABLET | ORAL | Status: AC
  Administered 2017-12-30: 1000 mg via ORAL
  Filled 2017-12-30: qty 2

## 2017-12-30 MED ORDER — LACTATED RINGERS IV SOLN
INTRAVENOUS | Status: DC
  Administered 2017-12-30: 13:00:00 via INTRAVENOUS

## 2017-12-30 MED ORDER — DIPHENHYDRAMINE HCL 50 MG/ML IJ SOLN: INTRAMUSCULAR | Status: DC | PRN

## 2017-12-30 MED ORDER — CHLORHEXIDINE GLUCONATE CLOTH 2 % EX PADS: 6.0000 | MEDICATED_PAD | Freq: Once | CUTANEOUS | Status: DC

## 2017-12-30 MED ORDER — PROPOFOL 10 MG/ML IV BOLUS
INTRAVENOUS | Status: DC | PRN
  Administered 2017-12-30: 250 mg via INTRAVENOUS

## 2017-12-30 MED ORDER — LIDOCAINE-EPINEPHRINE 1 %-1:100000 IJ SOLN
INTRAMUSCULAR | Status: DC | PRN
  Administered 2017-12-30: 3 mL

## 2017-12-30 MED ORDER — FENTANYL CITRATE (PF) 100 MCG/2ML IJ SOLN
INTRAMUSCULAR | Status: AC
  Filled 2017-12-30: qty 2

## 2017-12-30 MED ORDER — LIDOCAINE-EPINEPHRINE 1 %-1:100000 IJ SOLN
INTRAMUSCULAR | Status: AC
  Filled 2017-12-30: qty 1

## 2017-12-30 MED ORDER — FENTANYL CITRATE (PF) 100 MCG/2ML IJ SOLN
INTRAMUSCULAR | Status: DC | PRN
  Administered 2017-12-30: 50 ug via INTRAVENOUS

## 2017-12-30 MED ORDER — ONDANSETRON HCL 4 MG/2ML IJ SOLN
INTRAMUSCULAR | Status: DC | PRN
  Administered 2017-12-30: 4 mg via INTRAVENOUS

## 2017-12-30 SURGICAL SUPPLY — 45 items
ATTRACTOMAT 16X20 MAGNETIC DRP (DRAPES) IMPLANT
BENZOIN TINCTURE PRP APPL 2/3 (GAUZE/BANDAGES/DRESSINGS) IMPLANT
BLADE HEX COATED 2.75 (ELECTRODE) ×2 IMPLANT
BLADE SURG 15 STRL LF DISP TIS (BLADE) ×1 IMPLANT
BLADE SURG 15 STRL SS (BLADE) ×1
BLADE SURG SZ10 CARB STEEL (BLADE) IMPLANT
CLIP VESOCCLUDE SM WIDE 6/CT (CLIP) ×4 IMPLANT
COTTONBALL LRG STERILE PKG (GAUZE/BANDAGES/DRESSINGS) ×2 IMPLANT
DERMABOND ADVANCED (GAUZE/BANDAGES/DRESSINGS) ×1
DERMABOND ADVANCED .7 DNX12 (GAUZE/BANDAGES/DRESSINGS) ×1 IMPLANT
DISSECTOR ROUND CHERRY 3/8 STR (MISCELLANEOUS) ×2 IMPLANT
DRAPE LAPAROTOMY T 98X78 PEDS (DRAPES) ×2 IMPLANT
ELECT NEEDLE TIP 2.8 STRL (NEEDLE) IMPLANT
ELECT PENCIL ROCKER SW 15FT (MISCELLANEOUS) ×2 IMPLANT
ELECT REM PT RETURN 15FT ADLT (MISCELLANEOUS) ×2 IMPLANT
GAUZE 4X4 16PLY RFD (DISPOSABLE) ×2 IMPLANT
GAUZE SPONGE 4X4 12PLY STRL (GAUZE/BANDAGES/DRESSINGS) IMPLANT
GLOVE BIOGEL PI IND STRL 7.0 (GLOVE) ×1 IMPLANT
GLOVE BIOGEL PI INDICATOR 7.0 (GLOVE) ×1
GLOVE EUDERMIC 7 POWDERFREE (GLOVE) ×2 IMPLANT
GOWN STRL REUS W/TWL LRG LVL3 (GOWN DISPOSABLE) ×2 IMPLANT
GOWN STRL REUS W/TWL XL LVL3 (GOWN DISPOSABLE) ×2 IMPLANT
HEMOSTAT SURGICEL 2X14 (HEMOSTASIS) IMPLANT
KIT BASIN OR (CUSTOM PROCEDURE TRAY) ×2 IMPLANT
MARKER SKIN DUAL TIP RULER LAB (MISCELLANEOUS) ×2 IMPLANT
NEEDLE HYPO 25X1 1.5 SAFETY (NEEDLE) ×2 IMPLANT
PACK BASIC VI WITH GOWN DISP (CUSTOM PROCEDURE TRAY) ×2 IMPLANT
STAPLER VISISTAT 35W (STAPLE) ×2 IMPLANT
STRIP CLOSURE SKIN 1/2X4 (GAUZE/BANDAGES/DRESSINGS) IMPLANT
SUCTION FRAZIER HANDLE 12FR (TUBING) ×1
SUCTION TUBE FRAZIER 12FR DISP (TUBING) ×1 IMPLANT
SUT MNCRL AB 4-0 PS2 18 (SUTURE) ×2 IMPLANT
SUT SILK 2 0 (SUTURE)
SUT SILK 2-0 18XBRD TIE 12 (SUTURE) IMPLANT
SUT SILK 3 0 (SUTURE) ×1
SUT SILK 3-0 18XBRD TIE 12 (SUTURE) ×1 IMPLANT
SUT VIC AB 3-0 SH 18 (SUTURE) ×2 IMPLANT
SUT VIC AB 3-0 SH 27 (SUTURE)
SUT VIC AB 3-0 SH 27XBRD (SUTURE) IMPLANT
SUT VIC AB 4-0 PS2 27 (SUTURE) IMPLANT
SYR BULB IRRIGATION 50ML (SYRINGE) ×2 IMPLANT
SYR CONTROL 10ML LL (SYRINGE) ×2 IMPLANT
TOWEL OR 17X26 10 PK STRL BLUE (TOWEL DISPOSABLE) ×2 IMPLANT
TOWEL OR NON WOVEN STRL DISP B (DISPOSABLE) ×2 IMPLANT
YANKAUER SUCT BULB TIP 10FT TU (MISCELLANEOUS) ×2 IMPLANT

## 2017-12-30 NOTE — Op Note (Signed)
Patient Name:           [name]   Date of Surgery:        12/30/2017  Pre op Diagnosis:      Giant cell arteritis  Post op Diagnosis:    Giant cell arteritis  Procedure:                 Left temporal artery biopsy  Surgeon:                     Edsel Petrin. Dalbert Batman, M.D., FACS  Assistant:                      OR staff  Operative Indications:       This is a 72 year old man, referred by Dr. Warden Fillers from ophthamology for consideration of left temporal artery biopsy. Dr. Edrick Oh is his PCP. He is followed at [facility] neurological and [place] pain clinic for chronic pain syndrome.  Dr. Gavin Pound  is his rheumatologist      He has had some recent left sided facial symptoms and some deterioration of his vision. Sedimentation rate is 107 and CRP 23.6 months notably elevated. He was started on oral prednisone and omeprazole. His left facial symptoms have resolved. He has also started seeing a neurologist because of a pituitary cyst. The significance of that is not clear.      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 GERD. Hypertension. BMI 52. Mood disorder.    He will be scheduled for left temporal artery biopsy under monitored sedation. . He agrees with this plan.    Operative Findings:       The left temporal artery was in the usual anatomic location.  I removed a 2.5 cm long segment of the artery and send it to the pathology lab  Procedure in Detail:          The patient was brought to the operating room.  He underwent intravenous sedation and monitoring by the anesthesia department.  The hair in front of his left ear was clipped.  The left face was prepped and draped in a sterile fashion.  Surgical timeout was performed.  Intravenous antibiotics were given.  1% Xylocaine with epinephrine was used as a local infiltration anesthetic.    A Doppler was used to map out the arterial pulse.  A linear incision was made in the preauricular area following the  course of the artery.  Dissection was carried down through subcutaneous tissue, through the deep muscle fascia.  We slowly dissected out a good length of the artery.  One side branch was clamped divided and ligated with a Vicryl tie.  The proximal and distal ends of the artery were clamped and the artery was divided and sent to the lab.  Proximal and distal ends of the artery were ligated with 3-0 Vicryl ties.  Hemostasis was excellent.  The wound was irrigated.  The subcutaneous tissue was closed with interrupted sutures of 3-0 Vicryl and the skin closed with a running subcuticular 4-0 Monocryl and Dermabond.  Patient tolerated the procedure well was taken to PACU in stable condition.  EBL 10 cc.  Counts correct.  Complications none.    Addendum: I logged onto the [employer] and reviewed his prescription medication history     Myndi Wamble M. Dalbert Batman, M.D., FACS General and Minimally Invasive Surgery Breast and Colorectal Surgery  12/30/2017 2:57 PM

## 2017-12-30 NOTE — Anesthesia Postprocedure Evaluation (Signed)
Anesthesia Post Note  Patient: [name]  Procedure(s) Performed: BIOPSY TEMPORAL ARTERY (Left )     Patient location during evaluation: PACU Anesthesia Type: General Level of consciousness: sedated Pain management: pain level controlled Vital Signs Assessment: post-procedure vital signs reviewed and stable Respiratory status: spontaneous breathing and respiratory function stable Cardiovascular status: stable Postop Assessment: no apparent nausea or vomiting Anesthetic complications: no    Last Vitals:  Vitals:   12/30/17 1515 12/30/17 1525  BP: (!) 162/75 (!) 151/79  Pulse: 76 78  Resp: 12 15  Temp:  (!) 36.4 C  SpO2: 96% 98%    Last Pain:  Vitals:   12/30/17 1525  TempSrc:   PainSc: 0-No pain                 Imaya Duffy DANIEL

## 2017-12-30 NOTE — Anesthesia Preprocedure Evaluation (Addendum)
Anesthesia Evaluation  Patient identified by MRN, date of birth, ID band Patient awake    Reviewed: Allergy & Precautions, NPO status , Patient's Chart, lab work & pertinent test results  History of Anesthesia Complications Negative for: history of anesthetic complications  Airway Mallampati: III  TM Distance: >3 FB Neck ROM: Full    Dental  (+) Teeth Intact, Dental Advisory Given   Pulmonary sleep apnea , former smoker,    breath sounds clear to auscultation       Cardiovascular hypertension, + angina  Rhythm:Regular Rate:Normal     Neuro/Psych  Headaches, negative psych ROS   GI/Hepatic Neg liver ROS, GERD  ,  Endo/Other  Morbid obesity  Renal/GU negative Renal ROS     Musculoskeletal   Abdominal   Peds  Hematology   Anesthesia Other Findings   Reproductive/Obstetrics                            Anesthesia Physical  Anesthesia Plan  ASA: III  Anesthesia Plan: General   Post-op Pain Management:    Induction: Intravenous  PONV Risk Score and Plan: 3 and Ondansetron, Dexamethasone and Diphenhydramine  Airway Management Planned: Oral ETT and LMA  Additional Equipment:   Intra-op Plan:   Post-operative Plan: Extubation in OR  Informed Consent: I have reviewed the patients History and Physical, chart, labs and discussed the procedure including the risks, benefits and alternatives for the proposed anesthesia with the patient or authorized representative who has indicated his/her understanding and acceptance.   Dental advisory given  Plan Discussed with: CRNA and Anesthesiologist  Anesthesia Plan Comments:         Anesthesia Quick Evaluation

## 2017-12-30 NOTE — Interval H&P Note (Signed)
History and Physical Interval Note:  12/30/2017 1:21 PM  [name]  has presented today for surgery, with the diagnosis of [place]  The various methods of treatment have been discussed with the patient and family. After consideration of risks, benefits and other options for treatment, the patient has consented to  Procedure(s): BIOPSY TEMPORAL ARTERY (Left) as a surgical intervention .  The patient's history has been reviewed, patient examined, no change in status, stable for surgery.  I have reviewed the patient's chart and labs.  Questions were answered to the patient's satisfaction.     Adin Hector

## 2017-12-30 NOTE — Transfer of Care (Signed)
Immediate Anesthesia Transfer of Care Note  Patient: [name]  Procedure(s) Performed: BIOPSY TEMPORAL ARTERY (Left )  Patient Location: PACU  Anesthesia Type:General  Level of Consciousness: sedated  Airway & Oxygen Therapy: Patient Spontanous Breathing and Patient connected to face mask oxygen  Post-op Assessment: Report given to RN and Post -op Vital signs reviewed and stable  Post vital signs: Reviewed and stable  Last Vitals:  Vitals Value Taken Time  BP    Temp    Pulse 79 12/30/2017  2:58 PM  Resp    SpO2 100 % 12/30/2017  2:58 PM  Vitals shown include unvalidated device data.  Last Pain:  Vitals:   12/30/17 1222  TempSrc:   PainSc: 0-No pain         Complications: No apparent anesthesia complications

## 2017-12-30 NOTE — Anesthesia Procedure Notes (Signed)
Procedure Name: LMA Insertion Date/Time: 12/30/2017 2:10 PM Performed by: Lind Covert, CRNA Pre-anesthesia Checklist: Patient identified, Emergency Drugs available, Suction available, Patient being monitored and Timeout performed Patient Re-evaluated:Patient Re-evaluated prior to induction Oxygen Delivery Method: Circle system utilized Preoxygenation: Pre-oxygenation with 100% oxygen Induction Type: IV induction LMA: LMA inserted Number of attempts: 1 Placement Confirmation: positive ETCO2 and breath sounds checked- equal and bilateral Tube secured with: Tape Dental Injury: Teeth and Oropharynx as per pre-operative assessment

## 2017-12-30 NOTE — Discharge Instructions (Signed)
Ice pack, intermittently, for 24 hours  Okay to shower, starting tomorrow night  You may drive a car tomorrow if you are comfortable  The clear plastic superglue should wear off in about 3 weeks  Call Dr. Darrel Hoover office or make an appointment to see Dr. Dalbert Batman if there are any wound problems such as unusual swelling, severe pain, or infection  High-dose Tylenol or high-dose ibuprofen should be adequate for pain The methadone and Neurontin will also help

## 2017-12-31 NOTE — Telephone Encounter (Signed)
I called pt. He has yet to complete the lab work ordered by Dr. Leta Baptist. Pt reports that he has "been stuck so many times recently" and wants to know if this blood work is truly necessary. He asked me to review his [facility] chart to see if these labs have already been drawn. I advised him that I doubt these labs will have been drawn elsewhere, but did review the labs from [place], and I do not see any duplicate labs. Pt asked me to have Dr. Rexene Alberts review the labs from [facility] and let him know if he still should come in for the labs ordered by Dr. Leta Baptist.

## 2017-12-31 NOTE — Telephone Encounter (Signed)
I agree that he has had a lot of blood work done recently but those specific pituitary labs that were ordered have not actually been done. I would recommend for completion that he consider coming in for that blood work.

## 2018-01-01 NOTE — Telephone Encounter (Signed)
I called pt and explained Dr. Guadelupe Sabin recommendations. Pt reports that he will come to the office on 01/18/18 to have the labs drawn. I explained our office hours to him. Pt verbalized understanding.

## 2018-01-11 ENCOUNTER — Other Ambulatory Visit: Payer: Self-pay

## 2018-01-11 ENCOUNTER — Emergency Department (HOSPITAL_COMMUNITY)
Admission: EM | Admit: 2018-01-11 | Discharge: 2018-01-11 | Disposition: A | Discharge status: Home / Self Care | Payer: Medicare Other | Attending: Emergency Medicine | Admitting: Emergency Medicine

## 2018-01-11 ENCOUNTER — Emergency Department (HOSPITAL_COMMUNITY): Payer: Medicare Other

## 2018-01-11 ENCOUNTER — Encounter (HOSPITAL_COMMUNITY): Payer: Self-pay | Admitting: Emergency Medicine

## 2018-01-11 DIAGNOSIS — I10 Essential (primary) hypertension: Secondary | ICD-10-CM | POA: Diagnosis not present

## 2018-01-11 DIAGNOSIS — M79605 Pain in left leg: Secondary | ICD-10-CM

## 2018-01-11 DIAGNOSIS — Z79899 Other long term (current) drug therapy: Secondary | ICD-10-CM | POA: Diagnosis not present

## 2018-01-11 DIAGNOSIS — M79652 Pain in left thigh: Secondary | ICD-10-CM | POA: Insufficient documentation

## 2018-01-11 DIAGNOSIS — Z96643 Presence of artificial hip joint, bilateral: Secondary | ICD-10-CM | POA: Insufficient documentation

## 2018-01-11 DIAGNOSIS — Z7982 Long term (current) use of aspirin: Secondary | ICD-10-CM | POA: Insufficient documentation

## 2018-01-11 DIAGNOSIS — Z87891 Personal history of nicotine dependence: Secondary | ICD-10-CM | POA: Diagnosis not present

## 2018-01-11 NOTE — Discharge Instructions (Addendum)
IMPRESSION:  1. No acute findings.  2. Lucency about the distal stem of the LEFT hip arthroplasty  hardware, highly suspicious for hardware loosening. This could be  confirmed with nuclear medicine bone scan.  3. 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    Please follow-up with your doctor.  Please be careful that you do not cause extra stress to this area.  If your pain continues to worsen, then please seek additional medical care and evaluation.  I recommend that you call your doctor's office tomorrow morning for an appointment.  Please use caution as you may be at an increased risk for breaking your bone in your leg.

## 2018-01-11 NOTE — ED Triage Notes (Signed)
Pt c/o left upper leg pain that has been going on for a "long while but got unbearable last Thursday" denies any falls or injuries. Pt is worse with movement.

## 2018-01-11 NOTE — Telephone Encounter (Signed)
Pt called said he will try to come today to have labs.  FYI

## 2018-01-11 NOTE — ED Provider Notes (Signed)
[facility] DEPT Provider Note   CSN: [id number] Arrival date & time: 01/11/18  1642     History   Chief Complaint Chief Complaint  Patient presents with  . Leg Pain    HPI [name] is a 72 y.o. male with a history of temporal cell arteritis, obesity, status post bilateral hip replacement, who presents today for evaluation of left thigh pain.  He reports that this is been going on for multiple months however it got significantly worse on Thursday.  He is a pain management patient and takes methadone for chronic joint pain.  He denies any trauma, no fevers or chills.    HPI  Past Medical History:  Diagnosis Date  . Anginal pain ([place]) 2006   evaluated by cardio  . Arthritis    knees,feet,shoulders,elbows.hands  . Constipation   . Dyspnea    with exertion   . Dysrhythmia    Atrial Flutter- 2006- corredted itself  . Family history of adverse reaction to anesthesia    mother- with novocaine went into shock  . GERD (gastroesophageal reflux disease)   . Headache   . Hemorrhoids   . History of blood transfusion   . Hypertension   . Insomnia   . Sleep apnea    cpap  . Temporal giant cell arteritis ([place]) 12/30/2017    Patient Active Problem List   Diagnosis Date Noted  . Temporal giant cell arteritis ([place]) 12/30/2017  . SOB (shortness of breath) 03/31/2014  . Hypertension   . Hyponatremia 03/18/2012  . 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[place]) 03/17/2012    Past Surgical History:  Procedure Laterality Date  . APPENDECTOMY    . ARTERY BIOPSY Left 12/30/2017   Procedure: BIOPSY TEMPORAL ARTERY;  Surgeon: Fanny Skates, MD;  Location: [facility];  Service: General;  Laterality: Left;  . CHOLECYSTECTOMY    . COLONOSCOPY W/ POLYPECTOMY    . ESOPHAGOGASTRODUODENOSCOPY (EGD) WITH PROPOFOL  07/06/2012   Procedure: ESOPHAGOGASTRODUODENOSCOPY (EGD) WITH PROPOFOL;  Surgeon: Jeryl Columbia, MD;  [facility]: [facility];  Service: Endoscopy;   Laterality: N/A;  . ESOPHAGOSCOPY    . EYE SURGERY     left eye- muscle repair  . HERNIA REPAIR     ventral hernia  . JOINT REPLACEMENT     bilateral hips.  Right broke and had to be re placed again  . MASS EXCISION N/A 03/02/2015   Procedure: EXCISION ABDOMINAL WALL MASS;  Surgeon: Alphonsa Overall, MD;  Location: [facility];  Service: General;  Laterality: N/A;  . VERTICAL BANDED GASTROPLASTY          Home Medications    Prior to Admission medications   Medication Sig Start Date End Date Taking? Authorizing Provider  amitriptyline (ELAVIL) 25 MG tablet Take 25 mg by mouth at bedtime.    [provider]  amLODipine (NORVASC) 10 MG tablet Take 10 mg by mouth daily.    [provider]  aspirin EC 81 MG tablet Take 81 mg by mouth every evening.     [provider]  celecoxib (CELEBREX) 200 MG capsule Take 200 mg by mouth daily.    [provider]  Cholecalciferol (VITAMIN D3) 2000 units TABS Take 2,000 Units by mouth every evening.     [provider]  cycloSPORINE (RESTASIS) 0.05 % ophthalmic emulsion Place 1 drop into both eyes 2 (two) times daily.     [provider]  diphenhydrAMINE (BENADRYL) 25 MG tablet Take 25-50 mg by mouth  every 8 (eight) hours as needed for allergies (bee stings).     [provider]  furosemide (LASIX) 20 MG tablet Take 20 mg by mouth daily.  05/23/14   [provider]  gabapentin (NEURONTIN) 300 MG capsule Take 600-900 mg by mouth See admin instructions. Take 2 capsules in the morning & 3 capsules at bedtim    [provider]  methadone (DOLOPHINE) 10 MG tablet Take 10 mg by mouth 4 (four) times daily.     [provider]  methocarbamol (ROBAXIN) 750 MG tablet Take 750 mg by mouth 4 (four) times daily.    [provider]  Multiple Vitamins-Minerals (CENTRUM ADULTS PO) Take 1 tablet by mouth every evening.     [provider]  omeprazole (PRILOSEC) 40 MG capsule  Take 40 mg by mouth daily.    [provider]  predniSONE (DELTASONE) 20 MG tablet Take 20 mg by mouth 3 (three) times daily.    [provider]  PRESCRIPTION MEDICATION Apply 1 application topically at bedtime. Testosterone Cream 10%.  Apply 1 gm daily as directed.     [provider]  solifenacin (VESICARE) 10 MG tablet Take 10 mg by mouth daily.    [provider]  venlafaxine XR (EFFEXOR-XR) 150 MG 24 hr capsule Take 300 mg by mouth daily with breakfast.    [provider]    Family History Family History  Problem Relation Age of Onset  . Cancer Mother   . Alcohol abuse Father     Social History Social History   Tobacco Use  . Smoking status: Former Smoker    Packs/day: 2.00    Years: 10.00    Pack years: 20.00    Types: Cigarettes    Start date: 05/16/1966    Last attempt to quit: 03/11/1984    Years since quitting: 33.8  . Smokeless tobacco: Never Used  Substance Use Topics  . Alcohol use: Yes    Alcohol/week: 0.0 oz    Comment: occasionally a couple of times a year   . Drug use: No     Allergies   Bee venom   Review of Systems Review of Systems  Constitutional: Negative for chills and fever.  Musculoskeletal:       Pain in lateral aspect of left mid thigh.     Physical Exam Updated Vital Signs BP (!) 165/97 (BP Location: Left Arm)   Pulse 72   Temp 98.7 F (37.1 C) (Oral)   Resp 16   Ht 5\' 8"  (1.727 m)   Wt (!) 152 kg (335 lb)   SpO2 97%   BMI 50.94 kg/m   Physical Exam  Constitutional: He appears well-developed and well-nourished.  Obese  Cardiovascular:  2+ DP/PT pulses bilaterally.  Musculoskeletal:  Exam limited by patient body habitus.  There are no obvious masses, crepitus, or deformities palpated over the left thigh.  5/5 strength in bilateral lower extremities through ankle dorsiflexion and plantar flexion.  Neurological:  Sensation intact to bilateral lower extremities.  Skin: Skin is warm  and dry. He is not diaphoretic.  Psychiatric: He has a normal mood and affect. His behavior is normal.  Nursing note and vitals reviewed.    ED Treatments / Results  Labs (all labs ordered are listed, but only abnormal results are displayed) Labs Reviewed - No data to display  EKG None  Radiology Dg Femur Min 2 Views Left  Result Date: 01/11/2018 CLINICAL DATA:  Pain at lateral aspect of  LEFT hip, chronic but worsened on Thursday. EXAM: LEFT FEMUR 2 VIEWS COMPARISON:  None. FINDINGS: LEFT hip arthroplasty hardware appears intact and appropriately positioned. Lucency about the distal stem of the intramedullary hardware suggesting loosening. No acute appearing osseous abnormality. Soft tissues about the LEFT hip and femur are unremarkable. Advanced degenerative changes appreciated at the LEFT knee, with associated joint space narrowing and osseous spurring. IMPRESSION: 1. No acute findings. 2. Lucency about the distal stem of the LEFT hip arthroplasty hardware, highly suspicious for hardware loosening. This could be confirmed with nuclear medicine bone scan. 3. 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 Electronically Signed   By: Franki Cabot M.D.   On: 01/11/2018 20:49    Procedures Procedures (including critical care time)  Medications Ordered in ED Medications - No data to display   Initial Impression / Assessment and Plan / ED Course  I have reviewed the triage vital signs and the nursing notes.  Pertinent labs & imaging results that were available during my care of the patient were reviewed by me and considered in my medical decision making (see chart for details).    Patient presents today for evaluation of acute on chronic atraumatic left lateral thigh pain.  No obvious masses palpated.  X-rays were obtained showing concern for loosening hardware from his hip replacement.  Patient is in pain management, after discussion with patient will not provide additional medications.   Patient was instructed to follow-up with his orthopedist for further evaluation.  He was given strict return precautions, states his understanding.  He was instructed that he may be at an elevated risk for fractures and needs to be extra cautious.  Patient stated his understanding.  Discharged home.  Final Clinical Impressions(s) / ED Diagnoses   Final diagnoses:  Left leg pain    ED Discharge Orders    None       Ollen Gross 01/11/18 2201    Julianne Rice, MD 01/16/18 [phone]

## 2018-01-18 ENCOUNTER — Ambulatory Visit
Admission: RE | Admit: 2018-01-18 | Discharge: 2018-01-18 | Disposition: A | Discharge status: Home / Self Care | Payer: Medicare Other | Source: Ambulatory Visit | Attending: Diagnostic Neuroimaging | Admitting: Diagnostic Neuroimaging

## 2018-01-18 ENCOUNTER — Other Ambulatory Visit (INDEPENDENT_AMBULATORY_CARE_PROVIDER_SITE_OTHER): Payer: Self-pay

## 2018-01-18 ENCOUNTER — Other Ambulatory Visit: Payer: Self-pay

## 2018-01-18 DIAGNOSIS — G4489 Other headache syndrome: Secondary | ICD-10-CM

## 2018-01-18 DIAGNOSIS — D352 Benign neoplasm of pituitary gland: Secondary | ICD-10-CM | POA: Diagnosis not present

## 2018-01-18 DIAGNOSIS — Z0289 Encounter for other administrative examinations: Secondary | ICD-10-CM

## 2018-01-18 MED ORDER — GADOBENATE DIMEGLUMINE 529 MG/ML IV SOLN
15.0000 mL | Freq: Once | INTRAVENOUS | Status: AC | PRN
  Administered 2018-01-18: 15 mL via INTRAVENOUS

## 2018-01-18 NOTE — Telephone Encounter (Signed)
I called pt, left a message on his home number asking him to get his labs drawn at our office and to call me back if he has any questions or concerns.

## 2018-01-18 NOTE — Telephone Encounter (Signed)
VO received from Dr. Rexene Alberts to reorder all of the labs that Dr. Leta Baptist ordered so that pt's labs may be drawn. He presented to the office today. Orders placed.

## 2018-01-19 LAB — FOLLICLE STIMULATING HORMONE: FSH: 0.4 m[IU]/mL — ABNORMAL LOW (ref 1.5–12.4)

## 2018-01-19 LAB — UA/M W/RFLX CULTURE, ROUTINE
Bilirubin, UA: NEGATIVE
Glucose, UA: NEGATIVE
Ketones, UA: NEGATIVE
Leukocytes, UA: NEGATIVE
Nitrite, UA: NEGATIVE
Protein, UA: NEGATIVE
RBC, UA: NEGATIVE
Specific Gravity, UA: 1.011 (ref 1.005–1.030)
Urobilinogen, Ur: 0.2 mg/dL (ref 0.2–1.0)
pH, UA: 6 (ref 5.0–7.5)

## 2018-01-19 LAB — T4, FREE: Free T4: 0.94 ng/dL (ref 0.82–1.77)

## 2018-01-19 LAB — MICROSCOPIC EXAMINATION

## 2018-01-19 LAB — PROLACTIN: Prolactin: 24.6 ng/mL — ABNORMAL HIGH (ref 4.0–15.2)

## 2018-01-19 LAB — INSULIN-LIKE GROWTH FACTOR: Insulin-Like GF-1: 84 ng/mL (ref 41–179)

## 2018-01-19 LAB — TESTOSTERONE: Testosterone: 206 ng/dL — ABNORMAL LOW (ref 264–916)

## 2018-01-19 LAB — TSH: TSH: 1.16 u[IU]/mL (ref 0.450–4.500)

## 2018-01-19 LAB — LUTEINIZING HORMONE: LH: <0.2 m[IU]/mL — ABNORMAL LOW (ref 1.7–8.6)

## 2018-01-19 LAB — GROWTH HORMONE: Growth Hormone: 0.4 ng/mL (ref 0.0–10.0)

## 2018-01-25 ENCOUNTER — Other Ambulatory Visit: Payer: Self-pay | Admitting: Neurology

## 2018-01-25 DIAGNOSIS — D352 Benign neoplasm of pituitary gland: Secondary | ICD-10-CM

## 2018-01-25 NOTE — Progress Notes (Signed)
Please call pt: Recent brain MRI again shows an enlarged Pituitary gland - unchanged in appearance compared to the MRI from April - which may cause symptoms, including headache, vision changes and hormonal changes. Recent blood work shows increase in the hormone prolactin, which is probably connected with the growth in the brain. I would recommend 2 things at this point:  Referral to neurosurgery for eval and treatment of the growth on the pituitary gland in the brain, and referral to endocrinology for evaluation and treatment of the abnormal hormone secretion.  Referrals placed in chart.

## 2018-01-26 ENCOUNTER — Telehealth: Payer: Self-pay

## 2018-01-26 NOTE — Telephone Encounter (Signed)
I called pt, had an extensive conversation with him regarding his MRI results. Pt is agreeable to seeing a neurosurgeon and endocrinologist. Pt understands that those office will reach out to him to schedule these appts. Pt asked what the hormone prolactin is, and what it does. I answered all of pt's questions and he verbalized understanding.

## 2018-01-26 NOTE — Telephone Encounter (Signed)
-----   Message from [name], MD sent at 01/25/2018 10:42 AM EDT ----- Please call pt: Recent brain MRI again shows an enlarged Pituitary gland - unchanged in appearance compared to the MRI from April - which may cause symptoms, including headache, vision changes and hormonal changes. Recent blood work shows increase in the hormone prolactin, which is probably connected with the growth in the brain. I would recommend 2 things at this point:  Referral to neurosurgery for eval and treatment of the growth on the pituitary gland in the brain, and referral to endocrinology for evaluation and treatment of the abnormal hormone secretion.  Referrals placed in chart.

## 2018-02-15 ENCOUNTER — Other Ambulatory Visit: Payer: Self-pay

## 2018-02-15 ENCOUNTER — Encounter (HOSPITAL_COMMUNITY): Payer: Self-pay | Admitting: Emergency Medicine

## 2018-02-15 ENCOUNTER — Inpatient Hospital Stay (HOSPITAL_COMMUNITY): Payer: Medicare Other

## 2018-02-15 ENCOUNTER — Inpatient Hospital Stay (HOSPITAL_COMMUNITY)
Admission: EM | Admit: 2018-02-15 | Discharge: 2018-02-23 | DRG: 467 | Disposition: A | Discharge status: Skilled Nursing Facility | Payer: Medicare Other | Attending: Internal Medicine | Admitting: Internal Medicine

## 2018-02-15 ENCOUNTER — Emergency Department (HOSPITAL_COMMUNITY): Payer: Medicare Other

## 2018-02-15 DIAGNOSIS — Y838 Other surgical procedures as the cause of abnormal reaction of the patient, or of later complication, without mention of misadventure at the time of the procedure: Secondary | ICD-10-CM | POA: Diagnosis present

## 2018-02-15 DIAGNOSIS — Y792 Prosthetic and other implants, materials and accessory orthopedic devices associated with adverse incidents: Secondary | ICD-10-CM | POA: Diagnosis present

## 2018-02-15 DIAGNOSIS — D62 Acute posthemorrhagic anemia: Secondary | ICD-10-CM | POA: Diagnosis not present

## 2018-02-15 DIAGNOSIS — I10 Essential (primary) hypertension: Secondary | ICD-10-CM | POA: Diagnosis present

## 2018-02-15 DIAGNOSIS — T84091A Other mechanical complication of internal left hip prosthesis, initial encounter: Secondary | ICD-10-CM | POA: Diagnosis not present

## 2018-02-15 DIAGNOSIS — D649 Anemia, unspecified: Secondary | ICD-10-CM | POA: Diagnosis not present

## 2018-02-15 DIAGNOSIS — G4733 Obstructive sleep apnea (adult) (pediatric): Secondary | ICD-10-CM | POA: Diagnosis present

## 2018-02-15 DIAGNOSIS — Z96641 Presence of right artificial hip joint: Secondary | ICD-10-CM | POA: Diagnosis present

## 2018-02-15 DIAGNOSIS — Z6841 Body Mass Index (BMI) 40.0 and over, adult: Secondary | ICD-10-CM | POA: Diagnosis not present

## 2018-02-15 DIAGNOSIS — S7292XA Unspecified fracture of left femur, initial encounter for closed fracture: Secondary | ICD-10-CM | POA: Diagnosis not present

## 2018-02-15 DIAGNOSIS — Z01811 Encounter for preprocedural respiratory examination: Secondary | ICD-10-CM

## 2018-02-15 DIAGNOSIS — Z96649 Presence of unspecified artificial hip joint: Secondary | ICD-10-CM

## 2018-02-15 DIAGNOSIS — Z7982 Long term (current) use of aspirin: Secondary | ICD-10-CM

## 2018-02-15 DIAGNOSIS — Z7952 Long term (current) use of systemic steroids: Secondary | ICD-10-CM

## 2018-02-15 DIAGNOSIS — R52 Pain, unspecified: Secondary | ICD-10-CM

## 2018-02-15 DIAGNOSIS — K219 Gastro-esophageal reflux disease without esophagitis: Secondary | ICD-10-CM | POA: Diagnosis present

## 2018-02-15 DIAGNOSIS — R32 Unspecified urinary incontinence: Secondary | ICD-10-CM | POA: Diagnosis present

## 2018-02-15 DIAGNOSIS — F419 Anxiety disorder, unspecified: Secondary | ICD-10-CM | POA: Diagnosis present

## 2018-02-15 DIAGNOSIS — Z87891 Personal history of nicotine dependence: Secondary | ICD-10-CM

## 2018-02-15 DIAGNOSIS — M316 Other giant cell arteritis: Secondary | ICD-10-CM | POA: Diagnosis present

## 2018-02-15 DIAGNOSIS — F329 Major depressive disorder, single episode, unspecified: Secondary | ICD-10-CM | POA: Diagnosis present

## 2018-02-15 DIAGNOSIS — Z01810 Encounter for preprocedural cardiovascular examination: Secondary | ICD-10-CM

## 2018-02-15 DIAGNOSIS — E875 Hyperkalemia: Secondary | ICD-10-CM | POA: Diagnosis present

## 2018-02-15 DIAGNOSIS — S7290XA Unspecified fracture of unspecified femur, initial encounter for closed fracture: Secondary | ICD-10-CM | POA: Diagnosis present

## 2018-02-15 DIAGNOSIS — T84031A Mechanical loosening of internal left hip prosthetic joint, initial encounter: Secondary | ICD-10-CM | POA: Diagnosis present

## 2018-02-15 DIAGNOSIS — M979XXA Periprosthetic fracture around unspecified internal prosthetic joint, initial encounter: Secondary | ICD-10-CM

## 2018-02-15 DIAGNOSIS — N179 Acute kidney failure, unspecified: Secondary | ICD-10-CM | POA: Diagnosis present

## 2018-02-15 DIAGNOSIS — I951 Orthostatic hypotension: Secondary | ICD-10-CM | POA: Diagnosis not present

## 2018-02-15 DIAGNOSIS — M9702XA Periprosthetic fracture around internal prosthetic left hip joint, initial encounter: Principal | ICD-10-CM | POA: Diagnosis present

## 2018-02-15 DIAGNOSIS — Z96642 Presence of left artificial hip joint: Secondary | ICD-10-CM | POA: Diagnosis not present

## 2018-02-15 DIAGNOSIS — D539 Nutritional anemia, unspecified: Secondary | ICD-10-CM | POA: Diagnosis present

## 2018-02-15 LAB — BASIC METABOLIC PANEL
Anion gap: 5 (ref 5–15)
BUN: 27 mg/dL — ABNORMAL HIGH (ref 6–20)
CO2: 29 mmol/L (ref 22–32)
Calcium: 8.6 mg/dL — ABNORMAL LOW (ref 8.9–10.3)
Chloride: 108 mmol/L (ref 101–111)
Creatinine, Ser: 1.15 mg/dL (ref 0.61–1.24)
GFR calc Af Amer: >60 mL/min (ref >60)
GFR calc non Af Amer: >60 mL/min (ref >60)
Glucose, Bld: 87 mg/dL (ref 65–99)
Potassium: 4.4 mmol/L (ref 3.5–5.1)
Sodium: 142 mmol/L (ref 135–145)

## 2018-02-15 LAB — URINALYSIS, ROUTINE W REFLEX MICROSCOPIC
Bilirubin Urine: NEGATIVE
Glucose, UA: NEGATIVE mg/dL
Hgb urine dipstick: NEGATIVE
Ketones, ur: NEGATIVE mg/dL
Leukocytes, UA: NEGATIVE
Nitrite: NEGATIVE
Protein, ur: NEGATIVE mg/dL
Specific Gravity, Urine: 1.008 (ref 1.005–1.030)
pH: 6 (ref 5.0–8.0)

## 2018-02-15 LAB — CBC
HCT: 37.6 % — ABNORMAL LOW (ref 39.0–52.0)
Hemoglobin: 11.6 g/dL — ABNORMAL LOW (ref 13.0–17.0)
MCH: 29.9 pg (ref 26.0–34.0)
MCHC: 30.9 g/dL (ref 30.0–36.0)
MCV: 96.9 fL (ref 78.0–100.0)
Platelets: 156 10*3/uL (ref 150–400)
RBC: 3.88 MIL/uL — ABNORMAL LOW (ref 4.22–5.81)
RDW: 18.7 % — ABNORMAL HIGH (ref 11.5–15.5)
WBC: 9.7 10*3/uL (ref 4.0–10.5)

## 2018-02-15 LAB — PROTIME-INR
INR: 1.06
Prothrombin Time: 13.7 seconds (ref 11.4–15.2)

## 2018-02-15 MED ORDER — METHADONE HCL 10 MG PO TABS
10.0000 mg | ORAL_TABLET | Freq: Four times a day (QID) | ORAL | Status: DC
  Administered 2018-02-15 – 2018-02-23 (×31): 10 mg via ORAL
  Filled 2018-02-15 (×31): qty 1

## 2018-02-15 MED ORDER — GABAPENTIN 300 MG PO CAPS
900.0000 mg | ORAL_CAPSULE | Freq: Every day | ORAL | Status: DC
  Administered 2018-02-15 – 2018-02-22 (×8): 900 mg via ORAL
  Filled 2018-02-15 (×7): qty 3

## 2018-02-15 MED ORDER — CELECOXIB 200 MG PO CAPS
200.0000 mg | ORAL_CAPSULE | Freq: Two times a day (BID) | ORAL | Status: DC
  Administered 2018-02-15 – 2018-02-17 (×4): 200 mg via ORAL
  Filled 2018-02-15 (×5): qty 1

## 2018-02-15 MED ORDER — ENOXAPARIN SODIUM 80 MG/0.8ML ~~LOC~~ SOLN
75.0000 mg | SUBCUTANEOUS | Status: DC
  Administered 2018-02-15: 75 mg via SUBCUTANEOUS
  Filled 2018-02-15: qty 0.8

## 2018-02-15 MED ORDER — HYDROMORPHONE HCL 1 MG/ML IJ SOLN
0.5000 mg | INTRAMUSCULAR | Status: DC | PRN
  Administered 2018-02-15 – 2018-02-20 (×16): 0.5 mg via INTRAVENOUS
  Filled 2018-02-15 (×5): qty 0.5
  Filled 2018-02-15: qty 1
  Filled 2018-02-15 (×10): qty 0.5

## 2018-02-15 MED ORDER — ACETAMINOPHEN 650 MG RE SUPP: 650.0000 mg | Freq: Four times a day (QID) | RECTAL | Status: DC | PRN

## 2018-02-15 MED ORDER — GABAPENTIN 300 MG PO CAPS
600.0000 mg | ORAL_CAPSULE | Freq: Every day | ORAL | Status: DC
  Administered 2018-02-16 – 2018-02-23 (×8): 600 mg via ORAL
  Filled 2018-02-15 (×9): qty 2

## 2018-02-15 MED ORDER — AMLODIPINE BESYLATE 10 MG PO TABS
10.0000 mg | ORAL_TABLET | Freq: Every day | ORAL | Status: DC
  Administered 2018-02-16 – 2018-02-18 (×3): 10 mg via ORAL
  Filled 2018-02-15 (×3): qty 1

## 2018-02-15 MED ORDER — MORPHINE SULFATE (PF) 4 MG/ML IV SOLN
4.0000 mg | Freq: Once | INTRAVENOUS | Status: AC
  Administered 2018-02-15: 4 mg via INTRAVENOUS
  Filled 2018-02-15: qty 1

## 2018-02-15 MED ORDER — PREDNISONE 5 MG PO TABS
25.0000 mg | ORAL_TABLET | Freq: Every day | ORAL | Status: DC
  Administered 2018-02-16 – 2018-02-17 (×2): 25 mg via ORAL
  Filled 2018-02-15 (×2): qty 1

## 2018-02-15 MED ORDER — METHOCARBAMOL 500 MG PO TABS
750.0000 mg | ORAL_TABLET | Freq: Four times a day (QID) | ORAL | Status: DC
  Administered 2018-02-15 – 2018-02-23 (×31): 750 mg via ORAL
  Filled 2018-02-15 (×31): qty 2

## 2018-02-15 MED ORDER — SODIUM CHLORIDE 0.9 % IV SOLN: INTRAVENOUS | Status: AC

## 2018-02-15 MED ORDER — ASPIRIN EC 81 MG PO TBEC
81.0000 mg | DELAYED_RELEASE_TABLET | Freq: Every evening | ORAL | Status: DC
  Administered 2018-02-15 – 2018-02-16 (×2): 81 mg via ORAL
  Filled 2018-02-15 (×4): qty 1

## 2018-02-15 MED ORDER — ACETAMINOPHEN 325 MG PO TABS
650.0000 mg | ORAL_TABLET | Freq: Four times a day (QID) | ORAL | Status: DC | PRN
  Administered 2018-02-19 – 2018-02-22 (×4): 650 mg via ORAL
  Filled 2018-02-15 (×4): qty 2

## 2018-02-15 MED ORDER — DIPHENHYDRAMINE HCL 25 MG PO CAPS
25.0000 mg | ORAL_CAPSULE | Freq: Three times a day (TID) | ORAL | Status: DC | PRN
  Administered 2018-02-22: 50 mg via ORAL
  Filled 2018-02-15: qty 2

## 2018-02-15 MED ORDER — ADULT MULTIVITAMIN W/MINERALS CH
1.0000 | ORAL_TABLET | Freq: Every evening | ORAL | Status: DC
  Administered 2018-02-16 – 2018-02-23 (×7): 1 via ORAL
  Filled 2018-02-15 (×8): qty 1

## 2018-02-15 MED ORDER — AMITRIPTYLINE HCL 25 MG PO TABS
25.0000 mg | ORAL_TABLET | Freq: Every day | ORAL | Status: DC
  Administered 2018-02-15 – 2018-02-22 (×8): 25 mg via ORAL
  Filled 2018-02-15 (×8): qty 1

## 2018-02-15 MED ORDER — PANTOPRAZOLE SODIUM 40 MG PO TBEC
40.0000 mg | DELAYED_RELEASE_TABLET | Freq: Every day | ORAL | Status: DC
  Administered 2018-02-16 – 2018-02-18 (×3): 40 mg via ORAL
  Filled 2018-02-15 (×3): qty 1

## 2018-02-15 MED ORDER — GABAPENTIN 300 MG PO CAPS: 600.0000 mg | ORAL_CAPSULE | ORAL | Status: DC

## 2018-02-15 MED ORDER — VITAMIN D 1000 UNITS PO TABS
2000.0000 [IU] | ORAL_TABLET | Freq: Every evening | ORAL | Status: DC
  Administered 2018-02-15 – 2018-02-16 (×2): 2000 [IU] via ORAL
  Filled 2018-02-15 (×2): qty 2

## 2018-02-15 MED ORDER — SODIUM CHLORIDE 0.9 % IV SOLN
INTRAVENOUS | Status: DC
  Administered 2018-02-15 – 2018-02-16 (×2): via INTRAVENOUS

## 2018-02-15 MED ORDER — FUROSEMIDE 20 MG PO TABS
20.0000 mg | ORAL_TABLET | Freq: Every day | ORAL | Status: DC
  Administered 2018-02-16 – 2018-02-17 (×2): 20 mg via ORAL
  Filled 2018-02-15 (×2): qty 1

## 2018-02-15 MED ORDER — DARIFENACIN HYDROBROMIDE ER 7.5 MG PO TB24
7.5000 mg | ORAL_TABLET | Freq: Every day | ORAL | Status: DC
  Administered 2018-02-16 – 2018-02-23 (×8): 7.5 mg via ORAL
  Filled 2018-02-15 (×8): qty 1

## 2018-02-15 MED ORDER — VENLAFAXINE HCL ER 150 MG PO CP24
300.0000 mg | ORAL_CAPSULE | Freq: Every day | ORAL | Status: DC
  Administered 2018-02-16 – 2018-02-23 (×8): 300 mg via ORAL
  Filled 2018-02-15 (×8): qty 2

## 2018-02-15 MED ORDER — CYCLOSPORINE 0.05 % OP EMUL
1.0000 [drp] | Freq: Two times a day (BID) | OPHTHALMIC | Status: DC
  Administered 2018-02-15 – 2018-02-23 (×16): 1 [drp] via OPHTHALMIC
  Filled 2018-02-15 (×17): qty 1

## 2018-02-15 NOTE — ED Notes (Signed)
Pt eating graham crackers and drinking black coffee, family at the bedside

## 2018-02-15 NOTE — Progress Notes (Signed)
PHARMACY - ENOXAPARIN    Pharmacy has been asked to assist with dose adjustment of enoxaparin (lovenox) in this patient for DVT prophylaxis, when indicated.    Height: 68 inches Weight: 149.7 kg BMI: 50 CrCl: 84 ml/min Hgb: 11.6 Hct: 37.6 PLTC: 156  Assessment:  BMI > 30 and CrCl > 109ml/min so will adjust Enoxaparin to 0.5 mg/kg/q24h per manufacturer recommendations  PLAN: Change Enoxaparin to 75mg  sq q24h for VTE prophylaxis  Leone Haven, PharmD

## 2018-02-15 NOTE — H&P (Addendum)
[facility] H&P   Patient Demographics:    [name], is a 72 y.o. male  MRN: [id number]   DOB - 1946-07-01  Admit Date - 02/15/2018  Outpatient Primary MD for the patient is [name], MD  Referring MD/NP/PA: Marye Round  Outpatient Specialists:  [facility]  Patient coming from: home  Chief Complaint  Patient presents with  . Hip Pain      HPI:    [name]  is a 72 y.o. male,  w hypertension, temporal arteritis on chronic prednisone unable to get out of bed today due to left hip pain.   In ED, xray L hip IMPRESSION: There is again noted lucency surrounding the distal portion of femoral component of left hip arthroplasty suggesting loosening. There does appear to be interval development of mildly displaced cortical fracture overlying the distal tip of the prosthesis in the proximal left femoral shaft.  Wbc 9.7, Hgb 11.6, Plt 156 Na 142, K 4.4, Bun 27, Creatinine 1.15 INR 1.06  EKG nsr at 70, nl axis,  Urinalysis pending CXR pending  ED spoke with Dr. Maureen Ralphs who recommended transfer down to [facility].  Pt will be admitted for left femur fracture.     Review of systems:    In addition to the HPI above,   No Fever-chills, No Headache, No changes with Vision or hearing, No problems swallowing food or Liquids, No Chest pain, Cough or Shortness of Breath, No Abdominal pain, No Nausea or Vommitting, Bowel movements are regular, No Blood in stool or Urine, No dysuria, No new skin rashes or bruises,  No new weakness, tingling, numbness in any extremity, No recent weight gain or loss, No polyuria, polydypsia or polyphagia, No significant Mental Stressors.  A full 10 point Review of Systems was done, except as stated above, all other Review of Systems were negative.   With Past History of the following :    Past Medical History:    Diagnosis Date  . Anginal pain ([place]) 2006   evaluated by cardio  . Arthritis    knees,feet,shoulders,elbows.hands  . Constipation   . Dyspnea    with exertion   . Dysrhythmia    Atrial Flutter- 2006- corredted itself  . Family history of adverse reaction to anesthesia    mother- with novocaine went into shock  . GERD (gastroesophageal reflux disease)   . Headache   . Hemorrhoids   . History of blood transfusion   . Hypertension   . Insomnia   . Sleep apnea    cpap  . 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 APPENDECTOMY    . ARTERY BIOPSY Left 12/30/2017   Procedure: BIOPSY TEMPORAL ARTERY;  Surgeon: Fanny Skates, MD;  [facility]: [facility];  Service: General;  Laterality: Left;  . CHOLECYSTECTOMY    .  COLONOSCOPY W/ POLYPECTOMY    . ESOPHAGOGASTRODUODENOSCOPY (EGD) WITH PROPOFOL  07/06/2012   Procedure: ESOPHAGOGASTRODUODENOSCOPY (EGD) WITH PROPOFOL;  Surgeon: Jeryl Columbia, MD;  [facility];  Service: Endoscopy;  Laterality: N/A;  . ESOPHAGOSCOPY    . EYE SURGERY     left eye- muscle repair  . HERNIA REPAIR     ventral hernia  . JOINT REPLACEMENT     bilateral hips.  Right broke and had to be re placed again  . MASS EXCISION N/A 03/02/2015   Procedure: EXCISION ABDOMINAL WALL MASS;  Surgeon: Alphonsa Overall, MD;  Location: [facility];  Service: General;  Laterality: N/A;  . VERTICAL BANDED GASTROPLASTY        Social History:     Social History   Tobacco Use  . Smoking status: Former Smoker    Packs/day: 2.00    Years: 10.00    Pack years: 20.00    Types: Cigarettes    Start date: 05/16/1966    Last attempt to quit: 03/11/1984    Years since quitting: 33.9  . Smokeless tobacco: Never Used  Substance Use Topics  . Alcohol use: Yes    Alcohol/week: 0.0 oz    Comment: occasionally a couple of times a year      Lives - at home  Mobility - walks by self   Family History :     Family History   Problem Relation Age of Onset  . Cancer Mother   . Alcohol abuse Father        Home Medications:   Prior to Admission medications   Medication Sig Start Date End Date Taking? Authorizing Provider  amitriptyline (ELAVIL) 25 MG tablet Take 25 mg by mouth at bedtime.   Yes [provider]  amLODipine (NORVASC) 10 MG tablet Take 10 mg by mouth daily.   Yes [provider]  aspirin EC 81 MG tablet Take 81 mg by mouth every evening.    Yes [provider]  celecoxib (CELEBREX) 200 MG capsule Take 200 mg by mouth 2 (two) times daily.    Yes [provider]  Cholecalciferol (VITAMIN D3) 2000 units TABS Take 2,000 Units by mouth every evening.    Yes [provider]  cycloSPORINE (RESTASIS) 0.05 % ophthalmic emulsion Place 1 drop into both eyes 2 (two) times daily.    Yes [provider]  diphenhydrAMINE (BENADRYL) 25 MG tablet Take 25-50 mg by mouth every 8 (eight) hours as needed for allergies (bee stings).    Yes [provider]  furosemide (LASIX) 20 MG tablet Take 20 mg by mouth daily.  05/23/14  Yes [provider]  gabapentin (NEURONTIN) 300 MG capsule Take 600-900 mg by mouth See admin instructions. Take 2 capsules in the morning & 3 capsules at bedtim   Yes [provider]  methadone (DOLOPHINE) 10 MG tablet Take 10 mg by mouth 4 (four) times daily.    Yes [provider]  methocarbamol (ROBAXIN) 750 MG tablet Take 750 mg by mouth 4 (four) times daily.   Yes [provider]  Multiple Vitamins-Minerals (CENTRUM ADULTS PO) Take 1 tablet by mouth every evening.    Yes [provider]  omeprazole (PRILOSEC) 40 MG capsule Take 40 mg by mouth daily.   Yes [provider]  predniSONE (DELTASONE) 20 MG tablet Take 20 mg by mouth 3 (three) times daily.   Yes [provider]  PRESCRIPTION MEDICATION Apply 1 application topically at bedtime. Testosterone Cream 10%.  Apply 1 gm  daily as directed.    Yes [provider]  solifenacin (VESICARE) 10 MG tablet Take 10 mg by mouth daily.   Yes [provider]  venlafaxine XR (EFFEXOR-XR) 150 MG 24 hr capsule Take 300 mg by mouth daily with breakfast.   Yes [provider]     Allergies:     Allergies  Allergen Reactions  . Bee Venom Swelling     Physical Exam:   Vitals  Blood pressure 130/68, pulse 99, temperature 98.2 F (36.8 C), temperature source Oral, resp. rate 18, height 5\' 8"  (1.727 m), weight (!) 149.7 kg (330 lb), SpO2 97 %.   1. General  lying in bed in NAD,   2. Normal affect and insight, Not Suicidal or Homicidal, Awake Alert, Oriented X 3.  3. No F.N deficits, ALL C.Nerves Intact, Strength 5/5 all 4 extremities, Sensation intact all 4 extremities, Plantars down going.  4. Ears and Eyes appear Normal, Conjunctivae clear, PERRLA. Moist Oral Mucosa.  5. Supple Neck, No JVD, No cervical lymphadenopathy appriciated, No Carotid Bruits.  6. Symmetrical Chest wall movement, Good air movement bilaterally, CTAB.  7. RRR, No Gallops, Rubs or Murmurs, No Parasternal Heave.  8. Positive Bowel Sounds, Abdomen Soft, No tenderness, No organomegaly appriciated,No rebound -guarding or rigidity.  9.  No Cyanosis, Normal Skin Turgor, No Skin Rash or Bruise.  10. Good muscle tone,  joints appear normal , no effusions, Normal ROM.  11. No Palpable Lymph Nodes in Neck or Axillae     Data Review:    CBC Recent Labs  Lab 02/15/18 1249  WBC 9.7  HGB 11.6*  HCT 37.6*  PLT 156  MCV 96.9  MCH 29.9  MCHC 30.9  RDW 18.7*   ------------------------------------------------------------------------------------------------------------------  Chemistries  Recent Labs  Lab 02/15/18 1249  NA 142  K 4.4  CL 108  CO2 29  GLUCOSE 87  BUN 27*  CREATININE 1.15  CALCIUM 8.6*    ------------------------------------------------------------------------------------------------------------------ estimated creatinine clearance is 84.1 mL/min (by C-G formula based on SCr of 1.15 mg/dL). ------------------------------------------------------------------------------------------------------------------ No results for input(s): TSH, T4TOTAL, T3FREE, THYROIDAB in the last 72 hours.  Invalid input(s): FREET3  Coagulation profile Recent Labs  Lab 02/15/18 1249  INR 1.06   ------------------------------------------------------------------------------------------------------------------- No results for input(s): DDIMER in the last 72 hours. -------------------------------------------------------------------------------------------------------------------  Cardiac Enzymes No results for input(s): CKMB, TROPONINI, MYOGLOBIN in the last 168 hours.  Invalid input(s): CK ------------------------------------------------------------------------------------------------------------------ No results found for: BNP   ---------------------------------------------------------------------------------------------------------------  Urinalysis    Component Value Date/Time   COLORURINE YELLOW 09/06/2012 0213   APPEARANCEUR Clear 01/18/2018 1539   LABSPEC 1.020 09/06/2012 0213   PHURINE 6.5 09/06/2012 0213   GLUCOSEU Negative 01/18/2018 1539   HGBUR TRACE (A) 09/06/2012 0213   BILIRUBINUR Negative 01/18/2018 [place] 09/06/2012 0213   PROTEINUR Negative 01/18/2018 1539   PROTEINUR NEGATIVE 09/06/2012 0213   UROBILINOGEN 0.2 09/06/2012 0213   NITRITE Negative 01/18/2018 1539   NITRITE NEGATIVE 09/06/2012 0213   LEUKOCYTESUR Negative 01/18/2018 1539    ----------------------------------------------------------------------------------------------------------------   Imaging Results:    Dg Hip Unilat With Pelvis 2-3 Views Left  Result Date:  02/15/2018 CLINICAL DATA:  Left hip pain. EXAM: DG HIP (WITH OR WITHOUT PELVIS) 2-3V LEFT COMPARISON:  Radiographs of Jan 11, 2018. FINDINGS: Status post bilateral hip arthroplasties. Visualized portion of right hip prosthesis appears normal. However, there is again noted lucency surrounding the distal tip of the femoral component of the left hip prosthesis  with interval development of mildly displaced cortical fracture involving the proximal left femoral shaft. IMPRESSION: There is again noted lucency surrounding the distal portion of femoral component of left hip arthroplasty suggesting loosening. There does appear to be interval development of mildly displaced cortical fracture overlying the distal tip of the prosthesis in the proximal left femoral shaft. Electronically Signed   By: Marijo Conception, M.D.   On: 02/15/2018 14:11       Assessment & Plan:    Active Problems:   Hypertension   Femur fracture, left (HCC)   Anemia   Left femur fracture  Per ED, surgery probably not til Wednesday Defer to orthopedics Dilaudid 0.5mg  iv q4h prn  Cont methadone   Anemia Check cbc in am  Temporal arteritis Cont prednisone 25mg  po qday  Presumed Osteoporosis Bone density testing as outpatient by rheumatology or pcp please  Hypertension Cont amlodipine 10mg  po qday  Gerd Cont PPI  Urinary incontinence Vesicar=> enablex  Anxiety Cont Effexor 150mg  po qday  Edema Cont Lasix 20mg  po qday Check cmp in am  OSA  Cont cpap at nite       DVT Prophylaxis   Lovenox - SCDs  AM Labs Ordered, also please review Full Orders  Family Communication: Admission, patients condition and plan of care including tests being ordered have been discussed with the patient who indicate understanding and agree with the plan and Code Status.  Code Status  FULL CODE  Likely DC to  ?  Condition GUARDED    Consults called: orthopedics by ED,   Admission status: inpatient  Time spent in minutes  :45   Jani Gravel M.D on 02/15/2018 at 5:25 PM  Between 7am to 7pm - Pager - [phone]  After 7pm go to [email or web address] - password [facility]  [facility] - Office  [phone]

## 2018-02-15 NOTE — Progress Notes (Signed)
Patient arrived from [facility] via [place]. Patient stable. Telemetry applied. Oriented to the room and equipment. Report given to night RN.

## 2018-02-15 NOTE — ED Provider Notes (Signed)
[facility] EMERGENCY DEPARTMENT Provider Note   CSN: [id number] Arrival date & time: 02/15/18  1205     History   Chief Complaint Chief Complaint  Patient presents with  . Hip Pain    HPI [name] is a 72 y.o. male.  HPI Patient has been having pain in his left upper thigh for several weeks.  Patient was initially seen at [facility] on May 13.  He had an x-ray that was concerning for possible loosening of the hardware at the distal aspect of his left hip arthroplasty prosthesis.  Patient followed up with his orthopedic doctor.  He previously had surgery by Dr. Maureen Ralphs.  He he is scheduled to have follow-up surgery in August by Dr. Lyla Glassing.  Patient states that his pain has been progressing.  Last night it took him 3 hours just to try to get into bed.  Patient called the office this morning.  They suggested he go to [facility] emergency room.  Patient was not able to make it there because of the distance.  Patient is hoping to have surgery on his leg because of the progressive and debilitating pain. Past Medical History:  Diagnosis Date  . Anginal pain ([place]) 2006   evaluated by cardio  . Arthritis    knees,feet,shoulders,elbows.hands  . Constipation   . Dyspnea    with exertion   . Dysrhythmia    Atrial Flutter- 2006- corredted itself  . Family history of adverse reaction to anesthesia    mother- with novocaine went into shock  . GERD (gastroesophageal reflux disease)   . Headache   . Hemorrhoids   . History of blood transfusion   . Hypertension   . Insomnia   . Sleep apnea    cpap  . 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 SOB (shortness of breath) 03/31/2014  . Hypertension   . Hyponatremia 03/18/2012  . 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 APPENDECTOMY      . ARTERY BIOPSY Left 12/30/2017   Procedure: BIOPSY TEMPORAL ARTERY;  Surgeon: Fanny Skates, MD;  Location: [facility] ORS;  Service: General;  Laterality: Left;  . CHOLECYSTECTOMY    . COLONOSCOPY W/ POLYPECTOMY    . ESOPHAGOGASTRODUODENOSCOPY (EGD) WITH PROPOFOL  07/06/2012   Procedure: ESOPHAGOGASTRODUODENOSCOPY (EGD) WITH PROPOFOL;  Surgeon: Jeryl Columbia, MD;  Location: [facility] ENDOSCOPY;  Service: Endoscopy;  Laterality: N/A;  . ESOPHAGOSCOPY    . EYE SURGERY     left eye- muscle repair  . HERNIA REPAIR     ventral hernia  . JOINT REPLACEMENT     bilateral hips.  Right broke and had to be re placed again  . MASS EXCISION N/A 03/02/2015   Procedure: EXCISION ABDOMINAL WALL MASS;  Surgeon: Alphonsa Overall, MD;  Location: [place];  Service: General;  Laterality: N/A;  . VERTICAL BANDED GASTROPLASTY          Home Medications    Prior to Admission medications   Medication Sig Start Date End Date Taking? Authorizing Provider  amitriptyline (ELAVIL) 25 MG tablet Take 25 mg by mouth at bedtime.   Yes [provider]  amLODipine (NORVASC) 10 MG tablet Take 10 mg by mouth daily.   Yes [provider]  aspirin EC 81 MG tablet Take 81 mg by mouth  every evening.    Yes [provider]  celecoxib (CELEBREX) 200 MG capsule Take 200 mg by mouth 2 (two) times daily.    Yes [provider]  Cholecalciferol (VITAMIN D3) 2000 units TABS Take 2,000 Units by mouth every evening.    Yes [provider]  cycloSPORINE (RESTASIS) 0.05 % ophthalmic emulsion Place 1 drop into both eyes 2 (two) times daily.    Yes [provider]  diphenhydrAMINE (BENADRYL) 25 MG tablet Take 25-50 mg by mouth every 8 (eight) hours as needed for allergies (bee stings).    Yes [provider]  furosemide (LASIX) 20 MG tablet Take 20 mg by mouth daily.  05/23/14  Yes [provider]  gabapentin (NEURONTIN) 300 MG capsule Take 600-900 mg by mouth See admin instructions. Take  2 capsules in the morning & 3 capsules at bedtim   Yes [provider]  methadone (DOLOPHINE) 10 MG tablet Take 10 mg by mouth 4 (four) times daily.    Yes [provider]  methocarbamol (ROBAXIN) 750 MG tablet Take 750 mg by mouth 4 (four) times daily.   Yes [provider]  Multiple Vitamins-Minerals (CENTRUM ADULTS PO) Take 1 tablet by mouth every evening.    Yes [provider]  omeprazole (PRILOSEC) 40 MG capsule Take 40 mg by mouth daily.   Yes [provider]  predniSONE (DELTASONE) 20 MG tablet Take 20 mg by mouth 3 (three) times daily.   Yes [provider]  PRESCRIPTION MEDICATION Apply 1 application topically at bedtime. Testosterone Cream 10%.  Apply 1 gm daily as directed.    Yes [provider]  solifenacin (VESICARE) 10 MG tablet Take 10 mg by mouth daily.   Yes [provider]  venlafaxine XR (EFFEXOR-XR) 150 MG 24 hr capsule Take 300 mg by mouth daily with breakfast.   Yes [provider]    Family History Family History  Problem Relation Age of Onset  . Cancer Mother   . Alcohol abuse Father     Social History Social History   Tobacco Use  . Smoking status: Former Smoker    Packs/day: 2.00    Years: 10.00    Pack years: 20.00    Types: Cigarettes    Start date: 05/16/1966    Last attempt to quit: 03/11/1984    Years since quitting: 33.9  . Smokeless tobacco: Never Used  Substance Use Topics  . Alcohol use: Yes    Alcohol/week: 0.0 oz    Comment: occasionally a couple of times a year   . Drug use: No     Allergies   Bee venom   Review of Systems Review of Systems  Constitutional: Negative for fever.  Respiratory: Negative for shortness of breath.   Cardiovascular: Negative for chest pain.  Gastrointestinal: Negative for abdominal pain.  Neurological: Negative for weakness and numbness.  All other systems reviewed and are negative.    Physical Exam Updated Vital  Signs BP (!) 168/85   Pulse 74   Temp 98.2 F (36.8 C) (Oral)   Resp 18   Ht 1.727 m (5\' 8" )   Wt (!) 149.7 kg (330 lb)   SpO2 97%   BMI 50.18 kg/m   Physical Exam  Constitutional:  Morbidly obese  HENT:  Head: Normocephalic and atraumatic.  Right Ear: External ear normal.  Left Ear: External ear normal.  Eyes: Conjunctivae are normal. Right eye exhibits no discharge. Left eye exhibits no discharge. No scleral icterus.  Neck: Neck supple. No tracheal deviation present.  Cardiovascular: Normal rate, regular rhythm and intact distal pulses.  Pulmonary/Chest: Effort normal and breath sounds normal. No stridor. No respiratory distress. He has no wheezes. He has no rales.  Abdominal: Soft. Bowel sounds are normal. He exhibits no distension. There is no tenderness. There is no rebound and no guarding.  Musculoskeletal: He exhibits tenderness. He exhibits no edema.  Tenderness palpation left proximal thigh, no erythema or induration  Neurological: He is alert. He has normal strength. No cranial nerve deficit (no facial droop, extraocular movements intact, no slurred speech) or sensory deficit. He exhibits normal muscle tone. He displays no seizure activity. Coordination normal.  Skin: Skin is warm and dry. No rash noted. He is not diaphoretic.  Psychiatric: He has a normal mood and affect.  Nursing note and vitals reviewed.    ED Treatments / Results  Labs (all labs ordered are listed, but only abnormal results are displayed) Labs Reviewed  CBC - Abnormal; Notable for the following components:      Result Value   RBC 3.88 (*)    Hemoglobin 11.6 (*)    HCT 37.6 (*)    RDW 18.7 (*)    All other components within normal limits  BASIC METABOLIC PANEL - Abnormal; Notable for the following components:   BUN 27 (*)    Calcium 8.6 (*)    All other components within normal limits  PROTIME-INR    EKG EKG Interpretation  Date/Time:  Monday February 15 2018 13:10:20 EDT Ventricular  Rate:  69 PR Interval:    QRS Duration: 91 QT Interval:  388 QTC Calculation: 416 R Axis:   19 Text Interpretation:  Sinus rhythm Atrial premature complexes Low voltage, precordial leads No significant change since last tracing Confirmed by Dorie Rank [phone]) on 02/15/2018 1:12:18 PM   Radiology Dg Hip Unilat With Pelvis 2-3 Views Left  Result Date: 02/15/2018 CLINICAL DATA:  Left hip pain. EXAM: DG HIP (WITH OR WITHOUT PELVIS) 2-3V LEFT COMPARISON:  Radiographs of Jan 11, 2018. FINDINGS: Status post bilateral hip arthroplasties. Visualized portion of right hip prosthesis appears normal. However, there is again noted lucency surrounding the distal tip of the femoral component of the left hip prosthesis with interval development of mildly displaced cortical fracture involving the proximal left femoral shaft. IMPRESSION: There is again noted lucency surrounding the distal portion of femoral component of left hip arthroplasty suggesting loosening. There does appear to be interval development of mildly displaced cortical fracture overlying the distal tip of the prosthesis in the proximal left femoral shaft. Electronically Signed   By: Marijo Conception, M.D.   On: 02/15/2018 14:11    Procedures Procedures (including critical care time)  Medications Ordered in ED Medications  0.9 %  sodium chloride infusion ( Intravenous New Bag/Given 02/15/18 1248)     Initial Impression / Assessment and Plan / ED Course  I have reviewed the triage vital signs and the nursing notes.  Pertinent labs & imaging results that were available during my care of the patient were reviewed by me and considered in my medical decision making (see chart for details).  Clinical Course as of Feb 15 1625  Mon Feb 15, 2018  1448 X-rays show worsening loosening.  New finding of fracture of the cortex at the distal aspect of the hardware   [JK]  1528 Discussed with Dr Wynelle Link.  Admit to medical service.    Will try to get him  on the  schedule possibly.  They will see him tomorrow. Pt can eat and drink    [JK]  1620 D/w Dr Maudie Mercury   [JK]    Clinical Course User Index [JK] Dorie Rank, MD    Patient presented to the emergency room with complaints of increasing left leg pain.  Patient's x-rays unfortunately show worsening of the previous abnormality noted associated with his hip prosthesis.  He appears to have a fracture now associated with loosening.  I will consult with his orthopedist to see if we can arrange transfer down to [facility].  Final Clinical Impressions(s) / ED Diagnoses   Final diagnoses:  Fracture of bone adjacent to prosthesis, initial encounter      Dorie Rank, MD 02/15/18 2211

## 2018-02-15 NOTE — ED Triage Notes (Signed)
Pt reports he has been having left leg pain for a few weeks.  Was seen at [facility] a few weeks ago and told his hardware was loose.  Took 3 hours to get into the bed last night.

## 2018-02-15 NOTE — ED Notes (Signed)
[facility] transport pt to [facility].

## 2018-02-16 DIAGNOSIS — M316 Other giant cell arteritis: Secondary | ICD-10-CM

## 2018-02-16 LAB — CBC WITH DIFFERENTIAL/PLATELET
Basophils Absolute: 0 10*3/uL (ref 0.0–0.1)
Basophils Relative: 0 %
Eosinophils Absolute: 0.1 10*3/uL (ref 0.0–0.7)
Eosinophils Relative: 1 %
HCT: 37.2 % — ABNORMAL LOW (ref 39.0–52.0)
Hemoglobin: 11.6 g/dL — ABNORMAL LOW (ref 13.0–17.0)
Lymphocytes Relative: 33 %
Lymphs Abs: 2.7 10*3/uL (ref 0.7–4.0)
MCH: 30.8 pg (ref 26.0–34.0)
MCHC: 31.2 g/dL (ref 30.0–36.0)
MCV: 98.7 fL (ref 78.0–100.0)
Monocytes Absolute: 0.8 10*3/uL (ref 0.1–1.0)
Monocytes Relative: 9 %
Neutro Abs: 4.6 10*3/uL (ref 1.7–7.7)
Neutrophils Relative %: 57 %
Platelets: 153 10*3/uL (ref 150–400)
RBC: 3.77 MIL/uL — ABNORMAL LOW (ref 4.22–5.81)
RDW: 19.1 % — ABNORMAL HIGH (ref 11.5–15.5)
WBC: 8.1 10*3/uL (ref 4.0–10.5)

## 2018-02-16 LAB — BASIC METABOLIC PANEL
Anion gap: 6 (ref 5–15)
BUN: 29 mg/dL — ABNORMAL HIGH (ref 6–20)
CO2: 30 mmol/L (ref 22–32)
Calcium: 8.5 mg/dL — ABNORMAL LOW (ref 8.9–10.3)
Chloride: 108 mmol/L (ref 101–111)
Creatinine, Ser: 1.17 mg/dL (ref 0.61–1.24)
GFR calc Af Amer: >60 mL/min (ref >60)
GFR calc non Af Amer: >60 mL/min (ref >60)
Glucose, Bld: 87 mg/dL (ref 65–99)
Potassium: 4.4 mmol/L (ref 3.5–5.1)
Sodium: 144 mmol/L (ref 135–145)

## 2018-02-16 MED ORDER — SENNOSIDES-DOCUSATE SODIUM 8.6-50 MG PO TABS
1.0000 | ORAL_TABLET | Freq: Two times a day (BID) | ORAL | Status: DC
  Administered 2018-02-16 – 2018-02-23 (×14): 1 via ORAL
  Filled 2018-02-16 (×14): qty 1

## 2018-02-16 MED ORDER — POLYETHYLENE GLYCOL 3350 17 G PO PACK
17.0000 g | PACK | Freq: Every day | ORAL | Status: DC
  Administered 2018-02-16 – 2018-02-22 (×5): 17 g via ORAL
  Filled 2018-02-16 (×7): qty 1

## 2018-02-16 MED ORDER — SODIUM CHLORIDE 0.9 % IV SOLN: INTRAVENOUS | Status: DC

## 2018-02-16 NOTE — Progress Notes (Signed)
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  Patient reported falling about a couple of weeks ago, but the hip pain has been ongoing prior to the fall.  Patient is well-known to [facility], and has been following with Dr. Coralee North who planned for surgery in July.  Patient has a history of total hip arthroplasty several years ago.  In the ED, imaging showed a left periprosthetic femur fracture.  Orthopedics consulted.  Patient admitted for further management.  Today, patient still reports severe left hip pain worse with any movement in bed, denies any chest pain, worsening shortness of breath, fever/chills, abdominal pain, nausea/vomiting.  Orthopedics is on board.  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 at midnight, will hold Lovenox tonight  Temporal arteritis Continue home dose prednisone daily  Hypertension Stable Continue amlodipine daily  GERD Continue PPI  Presumed osteoporosis Plan for bone density testing as an outpatient  Urinary incontinence Continue vesica  OSA Continue CPAP at night  Anxiety Continue Effexor daily     Code Status: Full  Family Communication: None at bedside  Disposition Plan: Likely SNF after surgery   Consultants:  Orthopedics  Procedures:  None  Antimicrobials:  None  DVT prophylaxis: Lovenox   Objective: Vitals:   02/15/18 2109 02/16/18 0420 02/16/18 0542  02/16/18 1450  BP: (!) 136/111 (!) 155/75  137/76  Pulse: 83 65  80  Resp: 18 18  14   Temp: 98.9 F (37.2 C) 97.8 F (36.6 C)  99.2 F (37.3 C)  TempSrc: Oral Oral  Oral  SpO2: 97% 96%  96%  Weight:   (!) 151.5 kg (334 lb)   Height:        Intake/Output Summary (Last 24 hours) at 02/16/2018 1723 Last data filed at 02/16/2018 1050 Gross per 24 hour  Intake 1890 ml  Output 1325 ml  Net 565 ml   Filed Weights   02/15/18 1211 02/16/18 0542  Weight: (!) 149.7 kg (330 lb) (!) 151.5 kg (334 lb)    Exam:   General: NAD  Cardiovascular: S1, S2 present  Respiratory: CTA B  Abdomen: Soft, nontender, obese, bowel sounds present  Musculoskeletal: Left hip pain, no pedal edema bilaterally  Skin: Normal  Psychiatry: Normal mood   Data Reviewed: CBC: Recent Labs  Lab 02/15/18 1249 02/16/18 0910  WBC 9.7 8.1  NEUTROABS  --  4.6  HGB 11.6* 11.6*  HCT 37.6* 37.2*  MCV 96.9 98.7  PLT 156 614   Basic Metabolic Panel: Recent Labs  Lab 02/15/18 1249 02/16/18 0910  NA 142 144  K 4.4 4.4  CL 108 108  CO2 29 30  GLUCOSE 87 87  BUN 27* 29*  CREATININE 1.15 1.17  CALCIUM 8.6* 8.5*   GFR: Estimated Creatinine Clearance: 83.2 mL/min (by C-G formula based on SCr of 1.17 mg/dL). Liver Function Tests: No results for input(s): AST, ALT, ALKPHOS, BILITOT, PROT, ALBUMIN in the last 168 hours. No results for input(s): LIPASE, AMYLASE in the last 168 hours. No results  for input(s): AMMONIA in the last 168 hours. Coagulation Profile: Recent Labs  Lab 02/15/18 1249  INR 1.06   Cardiac Enzymes: No results for input(s): CKTOTAL, CKMB, CKMBINDEX, TROPONINI in the last 168 hours. BNP (last 3 results) No results for input(s): PROBNP in the last 8760 hours. HbA1C: No results for input(s): HGBA1C in the last 72 hours. CBG: No results for input(s): GLUCAP in the last 168 hours. Lipid Profile: No results for input(s): CHOL, HDL, LDLCALC, TRIG, CHOLHDL, LDLDIRECT in the last  72 hours. Thyroid Function Tests: No results for input(s): TSH, T4TOTAL, FREET4, T3FREE, THYROIDAB in the last 72 hours. Anemia Panel: No results for input(s): VITAMINB12, FOLATE, FERRITIN, TIBC, IRON, RETICCTPCT in the last 72 hours. Urine analysis:    Component Value Date/Time   COLORURINE YELLOW 02/15/2018 1947   APPEARANCEUR CLEAR 02/15/2018 1947   APPEARANCEUR Clear 01/18/2018 1539   LABSPEC 1.008 02/15/2018 1947   PHURINE 6.0 02/15/2018 1947   GLUCOSEU NEGATIVE 02/15/2018 1947   HGBUR NEGATIVE 02/15/2018 1947   BILIRUBINUR NEGATIVE 02/15/2018 1947   BILIRUBINUR Negative 01/18/2018 [place] 02/15/2018 1947   PROTEINUR NEGATIVE 02/15/2018 1947   UROBILINOGEN 0.2 09/06/2012 0213   NITRITE NEGATIVE 02/15/2018 1947   LEUKOCYTESUR NEGATIVE 02/15/2018 1947   LEUKOCYTESUR Negative 01/18/2018 1539   Sepsis Labs: @LABRCNTIP (procalcitonin:4,lacticidven:4)  )No results found for this or any previous visit (from the past 240 hour(s)).    Studies: Dg Chest 1 View  Result Date: 02/15/2018 CLINICAL DATA:  Preop hip fracture EXAM: CHEST  1 VIEW COMPARISON:  03/11/2012 FINDINGS: AP portable semi upright view of the chest. Mild cardiac enlargement with ectasia of the thoracic aorta. Mild aortic atherosclerosis is noted. Subpleural scarring and/or atelectasis is noted along the periphery of the left mid lung with pleural thickening. No acute fracture or pulmonary consolidation. Slight elevation along the lateral aspect of the left hemidiaphragm volume loss. 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 IMPRESSION: 1. No acute pulmonary consolidation. Subpleural scarring or atelectasis in the left mid lung. 2. Cardiac enlargement with slight aortic atherosclerosis. Electronically Signed   By: Ashley Royalty M.D.   On: 02/15/2018 21:11    Scheduled Meds: . amitriptyline  25 mg Oral QHS  . amLODipine  10 mg Oral Daily  . aspirin EC  81 mg Oral QPM  . celecoxib   200 mg Oral BID  . cholecalciferol  2,000 Units Oral QPM  . cycloSPORINE  1 drop Both Eyes BID  . darifenacin  7.5 mg Oral Daily  . enoxaparin (LOVENOX) injection  75 mg Subcutaneous Q24H  . furosemide  20 mg Oral Daily  . gabapentin  600 mg Oral Daily   And  . gabapentin  900 mg Oral QHS  . methadone  10 mg Oral QID  . methocarbamol  750 mg Oral QID  . multivitamin with minerals  1 tablet Oral QPM  . pantoprazole  40 mg Oral Daily  . predniSONE  25 mg Oral Q breakfast  . venlafaxine XR  300 mg Oral Q breakfast    Continuous Infusions: . [START ON 02/17/2018] sodium chloride       LOS: 1 day     Alma Friendly, MD [facility]   If 7PM-7AM, please contact night-coverage [email or web address] Password [facility] 02/16/2018, 5:23 PM

## 2018-02-16 NOTE — H&P (View-Only) (Signed)
Reason for Consult:Left femur fracture Referring Physician: Dr. [name] is an 72 y.o. male.  HPI: [name] is a 72 yo male known to me from previous Total Hip arthroplasty several years ago who has had several months of left hip pain and radiographs showing femoral component loosening. He had a marked increase in pain yesterday and went to the ER at [facility] and was noted to have a periprosthetic femur fracture. He was transferred here for management. Main complaint ifs left hip pain  Past Medical History:  Diagnosis Date  . Anginal pain ([place]) 2006   evaluated by cardio  . Arthritis    knees,feet,shoulders,elbows.hands  . Constipation   . Dyspnea    with exertion   . Dysrhythmia    Atrial Flutter- 2006- corredted itself  . Family history of adverse reaction to anesthesia    mother- with novocaine went into shock  . GERD (gastroesophageal reflux disease)   . Headache   . Hemorrhoids   . History of blood transfusion   . Hypertension   . Insomnia   . Sleep apnea    cpap  . 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 APPENDECTOMY    . ARTERY BIOPSY Left 12/30/2017   Procedure: BIOPSY TEMPORAL ARTERY;  Surgeon: Fanny Skates, MD;  Location: [facility];  Service: General;  Laterality: Left;  . CHOLECYSTECTOMY    . COLONOSCOPY W/ POLYPECTOMY    . ESOPHAGOGASTRODUODENOSCOPY (EGD) WITH PROPOFOL  07/06/2012   Procedure: ESOPHAGOGASTRODUODENOSCOPY (EGD) WITH PROPOFOL;  Surgeon: Jeryl Columbia, MD;  [facility];  Service: Endoscopy;  Laterality: N/A;  . ESOPHAGOSCOPY    . EYE SURGERY     left eye- muscle repair  . HERNIA REPAIR     ventral hernia  . JOINT REPLACEMENT     bilateral hips.  Right broke and had to be re placed again  . MASS EXCISION N/A 03/02/2015   Procedure: EXCISION ABDOMINAL WALL MASS;  Surgeon: Alphonsa Overall, MD;  Location: [facility];  Service: General;  Laterality: N/A;  . VERTICAL BANDED  GASTROPLASTY      Family History  Problem Relation Age of Onset  . Cancer Mother   . Alcohol abuse Father     Social History:  reports that he quit smoking about 33 years ago. His smoking use included cigarettes. He started smoking about 51 years ago. He has a 20.00 pack-year smoking history. He has never used smokeless tobacco. He reports that he drinks alcohol. He reports that he does not use drugs.  Allergies:  Allergies  Allergen Reactions  . Bee Venom Swelling    Medications: I have reviewed the patient's current medications.  Results for orders placed or performed during the hospital encounter of 02/15/18 (from the past 48 hour(s))  CBC     Status: Abnormal   Collection Time: 02/15/18 12:49 PM  Result Value Ref Range   WBC 9.7 4.0 - 10.5 K/uL   RBC 3.88 (L) 4.22 - 5.81 MIL/uL   Hemoglobin 11.6 (L) 13.0 - 17.0 g/dL   HCT 37.6 (L) 39.0 - 52.0 %   MCV 96.9 78.0 - 100.0 fL   MCH 29.9 26.0 - 34.0 pg   MCHC 30.9 30.0 - 36.0 g/dL   RDW 18.7 (H) 11.5 - 15.5 %   Platelets 156 150 - 400 K/uL    Comment: Performed at [facility], [address] [place], [place] [address]  Basic metabolic panel     Status: Abnormal   Collection Time: 02/15/18 12:49 PM  Result Value Ref Range   Sodium 142 135 - 145 mmol/L   Potassium 4.4 3.5 - 5.1 mmol/L   Chloride 108 101 - 111 mmol/L   CO2 29 22 - 32 mmol/L   Glucose, Bld 87 65 - 99 mg/dL   BUN 27 (H) 6 - 20 mg/dL   Creatinine, Ser 1.15 0.61 - 1.24 mg/dL   Calcium 8.6 (L) 8.9 - 10.3 mg/dL   GFR calc non Af Amer >60 >60 mL/min   GFR calc Af Amer >60 >60 mL/min    Comment: (NOTE) The eGFR has been calculated using the CKD EPI equation. This calculation has not been validated in all clinical situations. eGFR's persistently <60 mL/min signify possible Chronic Kidney Disease.    Anion gap 5 5 - 15    Comment: Performed at [facility], [address] [place], [place] [address]  Protime-INR     Status: None   Collection Time: 02/15/18  12:49 PM  Result Value Ref Range   Prothrombin Time 13.7 11.4 - 15.2 seconds   INR 1.06     Comment: Performed at [facility], [address], [place], [place] [address]  Urinalysis, Routine w reflex microscopic     Status: None   Collection Time: 02/15/18  7:47 PM  Result Value Ref Range   Color, Urine YELLOW YELLOW   APPearance CLEAR CLEAR   Specific Gravity, Urine 1.008 1.005 - 1.030   pH 6.0 5.0 - 8.0   Glucose, UA NEGATIVE NEGATIVE mg/dL   Hgb urine dipstick NEGATIVE NEGATIVE   Bilirubin Urine NEGATIVE NEGATIVE   Ketones, ur NEGATIVE NEGATIVE mg/dL   Protein, ur NEGATIVE NEGATIVE mg/dL   Nitrite NEGATIVE NEGATIVE   Leukocytes, UA NEGATIVE NEGATIVE    Comment: Performed at [facility], [place] [address] [place], [place] [address]  CBC with Differential/Platelet     Status: Abnormal   Collection Time: 02/16/18  9:10 AM  Result Value Ref Range   WBC 8.1 4.0 - 10.5 K/uL   RBC 3.77 (L) 4.22 - 5.81 MIL/uL   Hemoglobin 11.6 (L) 13.0 - 17.0 g/dL   HCT 37.2 (L) 39.0 - 52.0 %   MCV 98.7 78.0 - 100.0 fL   MCH 30.8 26.0 - 34.0 pg   MCHC 31.2 30.0 - 36.0 g/dL   RDW 19.1 (H) 11.5 - 15.5 %   Platelets 153 150 - 400 K/uL   Neutrophils Relative % 57 %   Neutro Abs 4.6 1.7 - 7.7 K/uL   Lymphocytes Relative 33 %   Lymphs Abs 2.7 0.7 - 4.0 K/uL   Monocytes Relative 9 %   Monocytes Absolute 0.8 0.1 - 1.0 K/uL   Eosinophils Relative 1 %   Eosinophils Absolute 0.1 0.0 - 0.7 K/uL   Basophils Relative 0 %   Basophils Absolute 0.0 0.0 - 0.1 K/uL    Comment: Performed at [facility], [place] [address] [place], [place] [address]  Basic metabolic panel     Status: Abnormal   Collection Time: 02/16/18  9:10 AM  Result Value Ref Range   Sodium 144 135 - 145 mmol/L   Potassium 4.4 3.5 - 5.1 mmol/L   Chloride 108 101 - 111 mmol/L   CO2 30 22 - 32 mmol/L   Glucose, Bld 87 65 - 99 mg/dL   BUN 29 (H) 6 - 20 mg/dL   Creatinine, Ser 1.17  0.61 - 1.24 mg/dL    Calcium 8.5 (L) 8.9 - 10.3 mg/dL   GFR calc non Af Amer >60 >60 mL/min   GFR calc Af Amer >60 >60 mL/min    Comment: (NOTE) The eGFR has been calculated using the CKD EPI equation. This calculation has not been validated in all clinical situations. eGFR's persistently <60 mL/min signify possible Chronic Kidney Disease.    Anion gap 6 5 - 15    Comment: Performed at [facility], [place] [address] [place], [place] [address]    Dg Chest 1 View  Result Date: 02/15/2018 CLINICAL DATA:  Preop hip fracture EXAM: CHEST  1 VIEW COMPARISON:  03/11/2012 FINDINGS: AP portable semi upright view of the chest. Mild cardiac enlargement with ectasia of the thoracic aorta. Mild aortic atherosclerosis is noted. Subpleural scarring and/or atelectasis is noted along the periphery of the left mid lung with pleural thickening. No acute fracture or pulmonary consolidation. Slight elevation along the lateral aspect of the left hemidiaphragm volume loss. 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 IMPRESSION: 1. No acute pulmonary consolidation. Subpleural scarring or atelectasis in the left mid lung. 2. Cardiac enlargement with slight aortic atherosclerosis. Electronically Signed   By: Ashley Royalty M.D.   On: 02/15/2018 21:11   Dg Hip Unilat With Pelvis 2-3 Views Left  Result Date: 02/15/2018 CLINICAL DATA:  Left hip pain. EXAM: DG HIP (WITH OR WITHOUT PELVIS) 2-3V LEFT COMPARISON:  Radiographs of Jan 11, 2018. FINDINGS: Status post bilateral hip arthroplasties. Visualized portion of right hip prosthesis appears normal. However, there is again noted lucency surrounding the distal tip of the femoral component of the left hip prosthesis with interval development of mildly displaced cortical fracture involving the proximal left femoral shaft. IMPRESSION: There is again noted lucency surrounding the distal portion of femoral component of left hip arthroplasty suggesting loosening. There  does appear to be interval development of mildly displaced cortical fracture overlying the distal tip of the prosthesis in the proximal left femoral shaft. Electronically Signed   By: Marijo Conception, M.D.   On: 02/15/2018 14:11    ROS Blood pressure (!) 155/75, pulse 65, temperature 97.8 F (36.6 C), temperature source Oral, resp. rate 18, height _0  (1.727 m), weight (!) 151.5 kg (334 lb), SpO2 96 %. 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 THA revision. Was scheduled for July but surgery will be moved up now because of his increased pain and periprosthetic fracture. Plan surgery tomorrow  Gaynelle Arabian 02/16/2018, 11:46 AM

## 2018-02-16 NOTE — Consult Note (Signed)
Reason for Consult:Left femur fracture Referring Physician: Dr. Ezenduka  [name] is an 71 y.o. male.  HPI: [name] is a 71 yo male known to me from previous Total Hip arthroplasty several years ago who has had several months of left hip pain and radiographs showing femoral component loosening. He had a marked increase in pain yesterday and went to the ER at [facility] and was noted to have a periprosthetic femur fracture. He was transferred here for management. Main complaint ifs left hip pain  Past Medical History:  Diagnosis Date  . Anginal pain ([facility]) 2006   evaluated by cardio  . Arthritis    knees,feet,shoulders,elbows.hands  . Constipation   . Dyspnea    with exertion   . Dysrhythmia    Atrial Flutter- 2006- corredted itself  . Family history of adverse reaction to anesthesia    mother- with novocaine went into shock  . GERD (gastroesophageal reflux disease)   . Headache   . Hemorrhoids   . History of blood transfusion   . Hypertension   . Insomnia   . Sleep apnea    cpap  . 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    Past Surgical History:  Procedure Laterality Date  . APPENDECTOMY    . ARTERY BIOPSY Left 12/30/2017   Procedure: BIOPSY TEMPORAL ARTERY;  Surgeon: Ingram, Haywood, MD;  [facility];  Service: General;  Laterality: Left;  . CHOLECYSTECTOMY    . COLONOSCOPY W/ POLYPECTOMY    . ESOPHAGOGASTRODUODENOSCOPY (EGD) WITH PROPOFOL  07/06/2012   Procedure: ESOPHAGOGASTRODUODENOSCOPY (EGD) WITH PROPOFOL;  Surgeon: Marc E Magod, MD;  [facility];  Service: Endoscopy;  Laterality: N/A;  . ESOPHAGOSCOPY    . EYE SURGERY     left eye- muscle repair  . HERNIA REPAIR     ventral hernia  . JOINT REPLACEMENT     bilateral hips.  Right broke and had to be re placed again  . MASS EXCISION N/A 03/02/2015   Procedure: EXCISION ABDOMINAL WALL MASS;  Surgeon: David Newman, MD;  [facility];  Service: General;  Laterality: N/A;  . VERTICAL BANDED  GASTROPLASTY      Family History  Problem Relation Age of Onset  . Cancer Mother   . Alcohol abuse Father     Social History:  reports that he quit smoking about 33 years ago. His smoking use included cigarettes. He started smoking about 51 years ago. He has a 20.00 pack-year smoking history. He has never used smokeless tobacco. He reports that he drinks alcohol. He reports that he does not use drugs.  Allergies:  Allergies  Allergen Reactions  . Bee Venom Swelling    Medications: I have reviewed the patient's current medications.  Results for orders placed or performed during the hospital encounter of 02/15/18 (from the past 48 hour(s))  CBC     Status: Abnormal   Collection Time: 02/15/18 12:49 PM  Result Value Ref Range   WBC 9.7 4.0 - 10.5 K/uL   RBC 3.88 (L) 4.22 - 5.81 MIL/uL   Hemoglobin 11.6 (L) 13.0 - 17.0 g/dL   HCT 37.6 (L) 39.0 - 52.0 %   MCV 96.9 78.0 - 100.0 fL   MCH 29.9 26.0 - 34.0 pg   MCHC 30.9 30.0 - 36.0 g/dL   RDW 18.7 (H) 11.5 - 15.5 %   Platelets 156 150 - 400 K/uL    Comment: Performed at [facility], [address] [place], [place] [address]    Basic metabolic panel     Status: Abnormal   Collection Time: 02/15/18 12:49 PM  Result Value Ref Range   Sodium 142 135 - 145 mmol/L   Potassium 4.4 3.5 - 5.1 mmol/L   Chloride 108 101 - 111 mmol/L   CO2 29 22 - 32 mmol/L   Glucose, Bld 87 65 - 99 mg/dL   BUN 27 (H) 6 - 20 mg/dL   Creatinine, Ser 1.15 0.61 - 1.24 mg/dL   Calcium 8.6 (L) 8.9 - 10.3 mg/dL   GFR calc non Af Amer >60 >60 mL/min   GFR calc Af Amer >60 >60 mL/min    Comment: (NOTE) The eGFR has been calculated using the CKD EPI equation. This calculation has not been validated in all clinical situations. eGFR's persistently <60 mL/min signify possible Chronic Kidney Disease.    Anion gap 5 5 - 15    Comment: Performed at [facility], [address] [place], [place] [address]  Protime-INR     Status: None   Collection Time: 02/15/18  12:49 PM  Result Value Ref Range   Prothrombin Time 13.7 11.4 - 15.2 seconds   INR 1.06     Comment: Performed at [facility], [address] [place], [place] [address]  Urinalysis, Routine w reflex microscopic     Status: None   Collection Time: 02/15/18  7:47 PM  Result Value Ref Range   Color, Urine YELLOW YELLOW   APPearance CLEAR CLEAR   Specific Gravity, Urine 1.008 1.005 - 1.030   pH 6.0 5.0 - 8.0   Glucose, UA NEGATIVE NEGATIVE mg/dL   Hgb urine dipstick NEGATIVE NEGATIVE   Bilirubin Urine NEGATIVE NEGATIVE   Ketones, ur NEGATIVE NEGATIVE mg/dL   Protein, ur NEGATIVE NEGATIVE mg/dL   Nitrite NEGATIVE NEGATIVE   Leukocytes, UA NEGATIVE NEGATIVE    Comment: Performed at [facility], [address] [place], [place] [address]  CBC with Differential/Platelet     Status: Abnormal   Collection Time: 02/16/18  9:10 AM  Result Value Ref Range   WBC 8.1 4.0 - 10.5 K/uL   RBC 3.77 (L) 4.22 - 5.81 MIL/uL   Hemoglobin 11.6 (L) 13.0 - 17.0 g/dL   HCT 37.2 (L) 39.0 - 52.0 %   MCV 98.7 78.0 - 100.0 fL   MCH 30.8 26.0 - 34.0 pg   MCHC 31.2 30.0 - 36.0 g/dL   RDW 19.1 (H) 11.5 - 15.5 %   Platelets 153 150 - 400 K/uL   Neutrophils Relative % 57 %   Neutro Abs 4.6 1.7 - 7.7 K/uL   Lymphocytes Relative 33 %   Lymphs Abs 2.7 0.7 - 4.0 K/uL   Monocytes Relative 9 %   Monocytes Absolute 0.8 0.1 - 1.0 K/uL   Eosinophils Relative 1 %   Eosinophils Absolute 0.1 0.0 - 0.7 K/uL   Basophils Relative 0 %   Basophils Absolute 0.0 0.0 - 0.1 K/uL    Comment: Performed at [facility], [address] [place], [place] [address]  Basic metabolic panel     Status: Abnormal   Collection Time: 02/16/18  9:10 AM  Result Value Ref Range   Sodium 144 135 - 145 mmol/L   Potassium 4.4 3.5 - 5.1 mmol/L   Chloride 108 101 - 111 mmol/L   CO2 30 22 - 32 mmol/L   Glucose, Bld 87 65 - 99 mg/dL   BUN 29 (H) 6 - 20 mg/dL   Creatinine, Ser 1.17   0.61 - 1.24 mg/dL    Calcium 8.5 (L) 8.9 - 10.3 mg/dL   GFR calc non Af Amer >60 >60 mL/min   GFR calc Af Amer >60 >60 mL/min    Comment: (NOTE) The eGFR has been calculated using the CKD EPI equation. This calculation has not been validated in all clinical situations. eGFR's persistently <60 mL/min signify possible Chronic Kidney Disease.    Anion gap 6 5 - 15    Comment: Performed at [facility], [address] [place],  [address]    Dg Chest 1 View  Result Date: 02/15/2018 CLINICAL DATA:  Preop hip fracture EXAM: CHEST  1 VIEW COMPARISON:  03/11/2012 FINDINGS: AP portable semi upright view of the chest. Mild cardiac enlargement with ectasia of the thoracic aorta. Mild aortic atherosclerosis is noted. Subpleural scarring and/or atelectasis is noted along the periphery of the left mid lung with pleural thickening. No acute fracture or pulmonary consolidation. Slight elevation along the lateral aspect of the left hemidiaphragm volume loss. Osteoarthritis of the AC and glenohumeral joints on the right. IMPRESSION: 1. No acute pulmonary consolidation. Subpleural scarring or atelectasis in the left mid lung. 2. Cardiac enlargement with slight aortic atherosclerosis. Electronically Signed   By: David  Kwon M.D.   On: 02/15/2018 21:11   Dg Hip Unilat With Pelvis 2-3 Views Left  Result Date: 02/15/2018 CLINICAL DATA:  Left hip pain. EXAM: DG HIP (WITH OR WITHOUT PELVIS) 2-3V LEFT COMPARISON:  Radiographs of Jan 11, 2018. FINDINGS: Status post bilateral hip arthroplasties. Visualized portion of right hip prosthesis appears normal. However, there is again noted lucency surrounding the distal tip of the femoral component of the left hip prosthesis with interval development of mildly displaced cortical fracture involving the proximal left femoral shaft. IMPRESSION: There is again noted lucency surrounding the distal portion of femoral component of left hip arthroplasty suggesting loosening. There  does appear to be interval development of mildly displaced cortical fracture overlying the distal tip of the prosthesis in the proximal left femoral shaft. Electronically Signed   By: James  Green Jr, M.D.   On: 02/15/2018 14:11    ROS Blood pressure (!) 155/75, pulse 65, temperature 97.8 F (36.6 C), temperature source Oral, resp. rate 18, height 5' 8" (1.727 m), weight (!) 151.5 kg (334 lb), SpO2 96 %. 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 THA revision. Was scheduled for July but surgery will be moved up now because of his increased pain and periprosthetic fracture. Plan surgery tomorrow  Axcel Horsch 02/16/2018, 11:46 AM     

## 2018-02-17 ENCOUNTER — Inpatient Hospital Stay (HOSPITAL_COMMUNITY): Payer: Medicare Other | Admitting: Certified Registered Nurse Anesthetist

## 2018-02-17 ENCOUNTER — Inpatient Hospital Stay (HOSPITAL_COMMUNITY): Payer: Medicare Other

## 2018-02-17 ENCOUNTER — Encounter (HOSPITAL_COMMUNITY): Payer: Self-pay | Admitting: Anesthesiology

## 2018-02-17 ENCOUNTER — Encounter (HOSPITAL_COMMUNITY)
Admission: EM | Disposition: A | Discharge status: Skilled Nursing Facility | Payer: Self-pay | Source: Home / Self Care | Attending: Internal Medicine

## 2018-02-17 DIAGNOSIS — S7292XA Unspecified fracture of left femur, initial encounter for closed fracture: Secondary | ICD-10-CM

## 2018-02-17 DIAGNOSIS — I10 Essential (primary) hypertension: Secondary | ICD-10-CM

## 2018-02-17 DIAGNOSIS — R52 Pain, unspecified: Secondary | ICD-10-CM

## 2018-02-17 HISTORY — PX: TOTAL HIP REVISION: SHX763

## 2018-02-17 LAB — BASIC METABOLIC PANEL
Anion gap: 6 (ref 5–15)
BUN: 24 mg/dL — ABNORMAL HIGH (ref 6–20)
CO2: 30 mmol/L (ref 22–32)
Calcium: 8.6 mg/dL — ABNORMAL LOW (ref 8.9–10.3)
Chloride: 107 mmol/L (ref 101–111)
Creatinine, Ser: 1.07 mg/dL (ref 0.61–1.24)
GFR calc Af Amer: >60 mL/min (ref >60)
GFR calc non Af Amer: >60 mL/min (ref >60)
Glucose, Bld: 92 mg/dL (ref 65–99)
Potassium: 3.8 mmol/L (ref 3.5–5.1)
Sodium: 143 mmol/L (ref 135–145)

## 2018-02-17 LAB — CBC WITH DIFFERENTIAL/PLATELET
Basophils Absolute: 0 10*3/uL (ref 0.0–0.1)
Basophils Relative: 0 %
Eosinophils Absolute: 0.1 10*3/uL (ref 0.0–0.7)
Eosinophils Relative: 1 %
HCT: 37.3 % — ABNORMAL LOW (ref 39.0–52.0)
Hemoglobin: 11.8 g/dL — ABNORMAL LOW (ref 13.0–17.0)
Lymphocytes Relative: 21 %
Lymphs Abs: 2 10*3/uL (ref 0.7–4.0)
MCH: 30.4 pg (ref 26.0–34.0)
MCHC: 31.6 g/dL (ref 30.0–36.0)
MCV: 96.1 fL (ref 78.0–100.0)
Monocytes Absolute: 0.9 10*3/uL (ref 0.1–1.0)
Monocytes Relative: 9 %
Neutro Abs: 6.6 10*3/uL (ref 1.7–7.7)
Neutrophils Relative %: 69 %
Platelets: 178 10*3/uL (ref 150–400)
RBC: 3.88 MIL/uL — ABNORMAL LOW (ref 4.22–5.81)
RDW: 18.5 % — ABNORMAL HIGH (ref 11.5–15.5)
WBC: 9.6 10*3/uL (ref 4.0–10.5)

## 2018-02-17 LAB — TYPE AND SCREEN
ABO/RH(D): A POS
Antibody Screen: POSITIVE
PT AG Type: NEGATIVE

## 2018-02-17 LAB — SURGICAL PCR SCREEN
MRSA, PCR: NEGATIVE
Staphylococcus aureus: NEGATIVE

## 2018-02-17 SURGERY — TOTAL HIP REVISION
Anesthesia: General | Site: Hip | Laterality: Left

## 2018-02-17 MED ORDER — PROPOFOL 10 MG/ML IV BOLUS
INTRAVENOUS | Status: AC
  Filled 2018-02-17: qty 40

## 2018-02-17 MED ORDER — 0.9 % SODIUM CHLORIDE (POUR BTL) OPTIME
TOPICAL | Status: DC | PRN
  Administered 2018-02-17: 1000 mL

## 2018-02-17 MED ORDER — PHENYLEPHRINE HCL 10 MG/ML IJ SOLN
INTRAVENOUS | Status: DC | PRN
  Administered 2018-02-17: 50 ug/min via INTRAVENOUS

## 2018-02-17 MED ORDER — PROMETHAZINE HCL 25 MG/ML IJ SOLN: 6.2500 mg | INTRAMUSCULAR | Status: DC | PRN

## 2018-02-17 MED ORDER — DEXTROSE 5 % IV SOLN
3.0000 g | INTRAVENOUS | Status: AC
  Administered 2018-02-17: 3 g via INTRAVENOUS
  Filled 2018-02-17: qty 3

## 2018-02-17 MED ORDER — HYDROMORPHONE HCL 2 MG/ML IJ SOLN
INTRAMUSCULAR | Status: AC
  Filled 2018-02-17: qty 1

## 2018-02-17 MED ORDER — HYDROMORPHONE HCL 1 MG/ML IJ SOLN
INTRAMUSCULAR | Status: AC
  Filled 2018-02-17: qty 1

## 2018-02-17 MED ORDER — LACTATED RINGERS IV SOLN
Freq: Once | INTRAVENOUS | Status: AC
  Administered 2018-02-17: 15:00:00 via INTRAVENOUS

## 2018-02-17 MED ORDER — LACTATED RINGERS IV SOLN
INTRAVENOUS | Status: DC | PRN
  Administered 2018-02-17 (×3): via INTRAVENOUS

## 2018-02-17 MED ORDER — ROCURONIUM BROMIDE 10 MG/ML (PF) SYRINGE
PREFILLED_SYRINGE | INTRAVENOUS | Status: DC | PRN
  Administered 2018-02-17: 50 mg via INTRAVENOUS
  Administered 2018-02-17: 30 mg via INTRAVENOUS

## 2018-02-17 MED ORDER — ONDANSETRON HCL 4 MG/2ML IJ SOLN
INTRAMUSCULAR | Status: DC | PRN
  Administered 2018-02-17: 4 mg via INTRAVENOUS

## 2018-02-17 MED ORDER — HYDROMORPHONE HCL 1 MG/ML IJ SOLN
0.2500 mg | INTRAMUSCULAR | Status: DC | PRN
  Administered 2018-02-17 (×4): 0.5 mg via INTRAVENOUS

## 2018-02-17 MED ORDER — HYDRALAZINE HCL 20 MG/ML IJ SOLN: 10.0000 mg | INTRAMUSCULAR | Status: DC | PRN

## 2018-02-17 MED ORDER — BISACODYL 10 MG RE SUPP: 10.0000 mg | Freq: Every day | RECTAL | Status: DC | PRN

## 2018-02-17 MED ORDER — ATROPINE SULFATE 0.4 MG/ML IV SOSY
PREFILLED_SYRINGE | INTRAVENOUS | Status: DC | PRN
  Administered 2018-02-17 (×2): .2 mg via INTRAVENOUS

## 2018-02-17 MED ORDER — METOCLOPRAMIDE HCL 5 MG/ML IJ SOLN: 5.0000 mg | Freq: Three times a day (TID) | INTRAMUSCULAR | Status: DC | PRN

## 2018-02-17 MED ORDER — BUPIVACAINE-EPINEPHRINE (PF) 0.25% -1:200000 IJ SOLN
INTRAMUSCULAR | Status: AC
  Filled 2018-02-17: qty 30

## 2018-02-17 MED ORDER — SODIUM CHLORIDE 0.9 % IV SOLN
INTRAVENOUS | Status: DC
  Administered 2018-02-17: 21:00:00 via INTRAVENOUS

## 2018-02-17 MED ORDER — FLEET ENEMA 7-19 GM/118ML RE ENEM: 1.0000 | ENEMA | Freq: Once | RECTAL | Status: DC | PRN

## 2018-02-17 MED ORDER — KETAMINE HCL 10 MG/ML IJ SOLN
INTRAMUSCULAR | Status: AC
  Filled 2018-02-17: qty 1

## 2018-02-17 MED ORDER — DEXAMETHASONE SODIUM PHOSPHATE 10 MG/ML IJ SOLN
INTRAMUSCULAR | Status: DC | PRN
  Administered 2018-02-17: 10 mg via INTRAVENOUS

## 2018-02-17 MED ORDER — SUGAMMADEX SODIUM 200 MG/2ML IV SOLN
INTRAVENOUS | Status: DC | PRN
  Administered 2018-02-17: 300 mg via INTRAVENOUS

## 2018-02-17 MED ORDER — DEXMEDETOMIDINE HCL IN NACL 200 MCG/50ML IV SOLN
INTRAVENOUS | Status: AC
  Filled 2018-02-17: qty 50

## 2018-02-17 MED ORDER — EPHEDRINE SULFATE-NACL 50-0.9 MG/10ML-% IV SOSY
PREFILLED_SYRINGE | INTRAVENOUS | Status: DC | PRN
  Administered 2018-02-17 (×2): 15 mg via INTRAVENOUS

## 2018-02-17 MED ORDER — STERILE WATER FOR IRRIGATION IR SOLN
Status: DC | PRN
  Administered 2018-02-17: 2000 mL

## 2018-02-17 MED ORDER — SUGAMMADEX SODIUM 200 MG/2ML IV SOLN
INTRAVENOUS | Status: AC
  Filled 2018-02-17: qty 2

## 2018-02-17 MED ORDER — DEXAMETHASONE SODIUM PHOSPHATE 10 MG/ML IJ SOLN
10.0000 mg | Freq: Once | INTRAMUSCULAR | Status: AC
  Administered 2018-02-18: 10 mg via INTRAVENOUS
  Filled 2018-02-17: qty 1

## 2018-02-17 MED ORDER — PHENYLEPHRINE 40 MCG/ML (10ML) SYRINGE FOR IV PUSH (FOR BLOOD PRESSURE SUPPORT)
PREFILLED_SYRINGE | INTRAVENOUS | Status: AC
  Filled 2018-02-17: qty 10

## 2018-02-17 MED ORDER — DIPHENHYDRAMINE HCL 12.5 MG/5ML PO ELIX: 12.5000 mg | ORAL_SOLUTION | ORAL | Status: DC | PRN

## 2018-02-17 MED ORDER — METHOCARBAMOL 500 MG PO TABS: 500.0000 mg | ORAL_TABLET | Freq: Four times a day (QID) | ORAL | Status: DC | PRN

## 2018-02-17 MED ORDER — PROPOFOL 10 MG/ML IV BOLUS
INTRAVENOUS | Status: DC | PRN
  Administered 2018-02-17: 200 mg via INTRAVENOUS

## 2018-02-17 MED ORDER — RIVAROXABAN 10 MG PO TABS
10.0000 mg | ORAL_TABLET | Freq: Every day | ORAL | Status: DC
  Administered 2018-02-18 – 2018-02-23 (×6): 10 mg via ORAL
  Filled 2018-02-17 (×8): qty 1

## 2018-02-17 MED ORDER — PHENOL 1.4 % MT LIQD
1.0000 | OROMUCOSAL | Status: DC | PRN
  Filled 2018-02-17: qty 177

## 2018-02-17 MED ORDER — MENTHOL 3 MG MT LOZG
1.0000 | LOZENGE | OROMUCOSAL | Status: DC | PRN
  Filled 2018-02-17: qty 9

## 2018-02-17 MED ORDER — ONDANSETRON HCL 4 MG PO TABS: 4.0000 mg | ORAL_TABLET | Freq: Four times a day (QID) | ORAL | Status: DC | PRN

## 2018-02-17 MED ORDER — POLYETHYLENE GLYCOL 3350 17 G PO PACK: 17.0000 g | PACK | Freq: Every day | ORAL | Status: DC | PRN

## 2018-02-17 MED ORDER — METOCLOPRAMIDE HCL 5 MG PO TABS: 5.0000 mg | ORAL_TABLET | Freq: Three times a day (TID) | ORAL | Status: DC | PRN

## 2018-02-17 MED ORDER — GLYCOPYRROLATE 0.2 MG/ML IV SOSY
PREFILLED_SYRINGE | INTRAVENOUS | Status: AC
  Filled 2018-02-17: qty 5

## 2018-02-17 MED ORDER — FENTANYL CITRATE (PF) 100 MCG/2ML IJ SOLN
INTRAMUSCULAR | Status: DC | PRN
  Administered 2018-02-17: 100 ug via INTRAVENOUS

## 2018-02-17 MED ORDER — BUPIVACAINE-EPINEPHRINE (PF) 0.25% -1:200000 IJ SOLN
INTRAMUSCULAR | Status: DC | PRN
  Administered 2018-02-17: 30 mL

## 2018-02-17 MED ORDER — FENTANYL CITRATE (PF) 100 MCG/2ML IJ SOLN
INTRAMUSCULAR | Status: AC
  Filled 2018-02-17: qty 2

## 2018-02-17 MED ORDER — LIDOCAINE 2% (20 MG/ML) 5 ML SYRINGE
INTRAMUSCULAR | Status: DC | PRN
  Administered 2018-02-17: 100 mg via INTRAVENOUS

## 2018-02-17 MED ORDER — DEXMEDETOMIDINE HCL IN NACL 200 MCG/50ML IV SOLN
INTRAVENOUS | Status: DC | PRN
  Administered 2018-02-17 (×3): 10 ug via INTRAVENOUS

## 2018-02-17 MED ORDER — KETAMINE HCL 10 MG/ML IJ SOLN
INTRAMUSCULAR | Status: DC | PRN
  Administered 2018-02-17 (×2): 70 mg via INTRAVENOUS

## 2018-02-17 MED ORDER — PREDNISONE 5 MG PO TABS
25.0000 mg | ORAL_TABLET | Freq: Every day | ORAL | Status: DC
  Administered 2018-02-19 – 2018-02-23 (×5): 25 mg via ORAL
  Filled 2018-02-17 (×5): qty 1

## 2018-02-17 MED ORDER — ONDANSETRON HCL 4 MG/2ML IJ SOLN: 4.0000 mg | Freq: Four times a day (QID) | INTRAMUSCULAR | Status: DC | PRN

## 2018-02-17 MED ORDER — CEFAZOLIN SODIUM-DEXTROSE 2-4 GM/100ML-% IV SOLN
2.0000 g | Freq: Four times a day (QID) | INTRAVENOUS | Status: AC
  Administered 2018-02-17 – 2018-02-18 (×2): 2 g via INTRAVENOUS
  Filled 2018-02-17 (×2): qty 100

## 2018-02-17 MED ORDER — DOCUSATE SODIUM 100 MG PO CAPS
100.0000 mg | ORAL_CAPSULE | Freq: Two times a day (BID) | ORAL | Status: DC
  Administered 2018-02-17 – 2018-02-23 (×12): 100 mg via ORAL
  Filled 2018-02-17 (×12): qty 1

## 2018-02-17 MED ORDER — PHENYLEPHRINE HCL 10 MG/ML IJ SOLN
INTRAMUSCULAR | Status: AC
  Filled 2018-02-17: qty 4

## 2018-02-17 MED ORDER — SUCCINYLCHOLINE CHLORIDE 20 MG/ML IJ SOLN
INTRAMUSCULAR | Status: DC | PRN
  Administered 2018-02-17: 120 mg via INTRAVENOUS

## 2018-02-17 MED ORDER — HYDROMORPHONE HCL 1 MG/ML IJ SOLN
INTRAMUSCULAR | Status: DC | PRN
  Administered 2018-02-17 (×4): 1 mg via INTRAVENOUS

## 2018-02-17 MED ORDER — METHOCARBAMOL 1000 MG/10ML IJ SOLN
500.0000 mg | Freq: Four times a day (QID) | INTRAVENOUS | Status: DC | PRN
  Administered 2018-02-17: 500 mg via INTRAVENOUS
  Filled 2018-02-17: qty 550

## 2018-02-17 SURGICAL SUPPLY — 78 items
BAG DECANTER FOR FLEXI CONT (MISCELLANEOUS) ×1 IMPLANT
BAG ZIPLOCK 12X15 (MISCELLANEOUS) ×4 IMPLANT
BIT DRILL 2.8X128 (BIT) ×1 IMPLANT
BLADE EXTENDED COATED 6.5IN (ELECTRODE) ×2 IMPLANT
BLADE SAW SGTL 73X25 THK (BLADE) ×1 IMPLANT
BRUSH FEMORAL CANAL (MISCELLANEOUS) IMPLANT
COVER SURGICAL LIGHT HANDLE (MISCELLANEOUS) ×2 IMPLANT
DRAPE INCISE IOBAN 66X45 STRL (DRAPES) ×1 IMPLANT
DRAPE INCISE IOBAN 85X60 (DRAPES) ×1 IMPLANT
DRAPE ORTHO SPLIT 77X108 STRL (DRAPES) ×2
DRAPE POUCH INSTRU U-SHP 10X18 (DRAPES) ×2 IMPLANT
DRAPE SURG ORHT 6 SPLT 77X108 (DRAPES) ×2 IMPLANT
DRAPE U-SHAPE 47X51 STRL (DRAPES) ×2 IMPLANT
DRSG EMULSION OIL 3X16 NADH (GAUZE/BANDAGES/DRESSINGS) ×1 IMPLANT
DRSG MEPILEX BORDER 4X12 (GAUZE/BANDAGES/DRESSINGS) ×1 IMPLANT
DRSG MEPILEX BORDER 4X4 (GAUZE/BANDAGES/DRESSINGS) ×3 IMPLANT
DRSG MEPILEX BORDER 4X8 (GAUZE/BANDAGES/DRESSINGS) ×1 IMPLANT
DURAPREP 26ML APPLICATOR (WOUND CARE) ×3 IMPLANT
ELECT REM PT RETURN 15FT ADLT (MISCELLANEOUS) ×2 IMPLANT
EVACUATOR 1/8 PVC DRAIN (DRAIN) ×2 IMPLANT
GAUZE SPONGE 2X2 8PLY STRL LF (GAUZE/BANDAGES/DRESSINGS) IMPLANT
GAUZE SPONGE 4X4 12PLY STRL (GAUZE/BANDAGES/DRESSINGS) ×1 IMPLANT
GLOVE BIO SURGEON STRL SZ 6.5 (GLOVE) ×2 IMPLANT
GLOVE BIO SURGEON STRL SZ7.5 (GLOVE) ×1 IMPLANT
GLOVE BIO SURGEON STRL SZ8 (GLOVE) ×2 IMPLANT
GLOVE BIOGEL PI IND STRL 6.5 (GLOVE) ×3 IMPLANT
GLOVE BIOGEL PI IND STRL 7.0 (GLOVE) IMPLANT
GLOVE BIOGEL PI IND STRL 8 (GLOVE) IMPLANT
GLOVE BIOGEL PI INDICATOR 6.5 (GLOVE) ×1
GLOVE BIOGEL PI INDICATOR 7.0 (GLOVE) ×2
GLOVE BIOGEL PI INDICATOR 8 (GLOVE) ×1
GLOVE ECLIPSE 7.5 STRL STRAW (GLOVE) ×1 IMPLANT
GLOVE SURG ORTHO 8.0 STRL STRW (GLOVE) ×1 IMPLANT
GLOVE SURG SS PI 6.5 STRL IVOR (GLOVE) IMPLANT
GLOVE SURG SS PI 7.0 STRL IVOR (GLOVE) ×2 IMPLANT
GOWN SRG XL XLNG 56XLVL 4 (GOWN DISPOSABLE) IMPLANT
GOWN STRL NON-REIN XL XLG LVL4 (GOWN DISPOSABLE) ×1
GOWN STRL REUS W/TWL LRG LVL3 (GOWN DISPOSABLE) ×6 IMPLANT
GOWN STRL REUS W/TWL XL LVL3 (GOWN DISPOSABLE) ×1 IMPLANT
GUIDEWIRE BEAD TIP (WIRE) ×1 IMPLANT
HANDPIECE INTERPULSE COAX TIP (DISPOSABLE)
HEAD CERAMIC DELTA 36 PLUS 1.5 (Hips) ×1 IMPLANT
HOLDER FOLEY CATH W/STRAP (MISCELLANEOUS) ×1 IMPLANT
IMMOBILIZER KNEE 20 (SOFTGOODS) IMPLANT
IMMOBILIZER KNEE 20 THIGH 36 (SOFTGOODS) IMPLANT
LINER MARATHON 4 NEUTRAL 36X56 (Hips) ×1 IMPLANT
MANIFOLD NEPTUNE II (INSTRUMENTS) ×2 IMPLANT
MARKER SKIN DUAL TIP RULER LAB (MISCELLANEOUS) ×2 IMPLANT
NDL SAFETY ECLIPSE 18X1.5 (NEEDLE) ×1 IMPLANT
NEEDLE HYPO 18GX1.5 SHARP (NEEDLE) ×1
NS IRRIG 1000ML POUR BTL (IV SOLUTION) ×2 IMPLANT
PACK TOTAL JOINT WL LF (CUSTOM PROCEDURE TRAY) ×2 IMPLANT
PASSER SUT SWANSON 36MM LOOP (INSTRUMENTS) ×1 IMPLANT
POSITIONER SURGICAL ARM (MISCELLANEOUS) ×2 IMPLANT
PRESSURIZER FEMORAL UNIV (MISCELLANEOUS) IMPLANT
SET HNDPC FAN SPRY TIP SCT (DISPOSABLE) IMPLANT
SPONGE GAUZE 2X2 STER 10/PKG (GAUZE/BANDAGES/DRESSINGS) ×1
SPONGE LAP 18X18 RF (DISPOSABLE) ×1 IMPLANT
STAPLER VISISTAT 35W (STAPLE) ×1 IMPLANT
STEM STRAIGHT 8IN 18.0M (Stem) ×1 IMPLANT
SUCTION FRAZIER HANDLE 10FR (MISCELLANEOUS) ×1
SUCTION TUBE FRAZIER 10FR DISP (MISCELLANEOUS) ×1 IMPLANT
SUT ETHIBOND NAB CT1 #1 30IN (SUTURE) IMPLANT
SUT STRATAFIX 0 PDS 27 VIOLET (SUTURE) ×4
SUT VIC AB 1 CT1 27 (SUTURE) ×3
SUT VIC AB 1 CT1 27XBRD ANTBC (SUTURE) ×3 IMPLANT
SUT VIC AB 2-0 CT1 27 (SUTURE) ×4
SUT VIC AB 2-0 CT1 TAPERPNT 27 (SUTURE) ×3 IMPLANT
SUTURE STRATFX 0 PDS 27 VIOLET (SUTURE) ×1 IMPLANT
SWAB COLLECTION DEVICE MRSA (MISCELLANEOUS) IMPLANT
SWAB CULTURE ESWAB REG 1ML (MISCELLANEOUS) IMPLANT
SYR 50ML LL SCALE MARK (SYRINGE) ×2 IMPLANT
TOWEL OR 17X26 10 PK STRL BLUE (TOWEL DISPOSABLE) ×3 IMPLANT
TOWER CARTRIDGE SMART MIX (DISPOSABLE) IMPLANT
TRAY FOLEY MTR SLVR 16FR STAT (SET/KITS/TRAYS/PACK) ×2 IMPLANT
TUBE KAMVAC SUCTION (TUBING) IMPLANT
WATER STERILE IRR 1000ML POUR (IV SOLUTION) ×4 IMPLANT
YANKAUER SUCT BULB TIP 10FT TU (MISCELLANEOUS) ×2 IMPLANT

## 2018-02-17 NOTE — Anesthesia Preprocedure Evaluation (Signed)
Anesthesia Evaluation  Patient identified by MRN, date of birth, ID band Patient awake    Reviewed: Allergy & Precautions, NPO status , Patient's Chart, lab work & pertinent test results  Airway Mallampati: II  TM Distance: >3 FB Neck ROM: Full    Dental no notable dental hx.    Pulmonary sleep apnea and Continuous Positive Airway Pressure Ventilation , former smoker,    Pulmonary exam normal breath sounds clear to auscultation       Cardiovascular hypertension, Normal cardiovascular exam Rhythm:Regular Rate:Normal     Neuro/Psych negative neurological ROS  negative psych ROS   GI/Hepatic negative GI ROS, Neg liver ROS,   Endo/Other  negative endocrine ROS  Renal/GU negative Renal ROS  negative genitourinary   Musculoskeletal  (+) Arthritis ,   Abdominal   Peds negative pediatric ROS (+)  Hematology  (+) anemia ,   Anesthesia Other Findings   Reproductive/Obstetrics negative OB ROS                             Anesthesia Physical Anesthesia Plan  ASA: III  Anesthesia Plan: General   Post-op Pain Management:    Induction: Intravenous  PONV Risk Score and Plan: 2 and Ondansetron, Dexamethasone and Treatment may vary due to age or medical condition  Airway Management Planned: Oral ETT  Additional Equipment:   Intra-op Plan:   Post-operative Plan: Extubation in OR  Informed Consent: I have reviewed the patients History and Physical, chart, labs and discussed the procedure including the risks, benefits and alternatives for the proposed anesthesia with the patient or authorized representative who has indicated his/her understanding and acceptance.   Dental advisory given  Plan Discussed with: CRNA and Surgeon  Anesthesia Plan Comments:         Anesthesia Quick Evaluation

## 2018-02-17 NOTE — Interval H&P Note (Signed)
History and Physical Interval Note:  02/17/2018 3:19 PM  [name]  has presented today for surgery, with the diagnosis of loose left total hip  The various methods of treatment have been discussed with the patient and family. After consideration of risks, benefits and other options for treatment, the patient has consented to  Procedure(s): Left total hip arthroplasty revision (Left) as a surgical intervention .  The patient's history has been reviewed, patient examined, no change in status, stable for surgery.  I have reviewed the patient's chart and labs.  Questions were answered to the patient's satisfaction.     [name]

## 2018-02-17 NOTE — Transfer of Care (Signed)
Immediate Anesthesia Transfer of Care Note  Patient: [name]  Procedure(s) Performed: Left total hip arthroplasty revision (Left Hip)  Patient Location: PACU  Anesthesia Type:General  Level of Consciousness: awake, alert , oriented and patient cooperative  Airway & Oxygen Therapy: Patient Spontanous Breathing and Patient connected to face mask  Post-op Assessment: Report given to RN and Post -op Vital signs reviewed and stable  Post vital signs: Reviewed and stable  Last Vitals:  Vitals Value Taken Time  BP 157/143 02/17/2018  7:06 PM  Temp    Pulse    Resp 14 02/17/2018  7:07 PM  SpO2    Vitals shown include unvalidated device data.  Last Pain:  Vitals:   02/17/18 1522  TempSrc:   PainSc: 7       Patients Stated Pain Goal: 3 (16/10/96 0454)  Complications: No apparent anesthesia complications

## 2018-02-17 NOTE — Anesthesia Procedure Notes (Signed)
Procedure Name: Intubation Date/Time: 02/17/2018 5:00 PM Performed by: Lavina Hamman, CRNA Pre-anesthesia Checklist: Patient identified, Emergency Drugs available, Suction available, Patient being monitored and Timeout performed Patient Re-evaluated:Patient Re-evaluated prior to induction Oxygen Delivery Method: Circle system utilized Preoxygenation: Pre-oxygenation with 100% oxygen Induction Type: IV induction Ventilation: Mask ventilation without difficulty Laryngoscope Size: Mac and 4 Grade View: Grade III Tube type: Oral Tube size: 7.5 mm Number of attempts: 1 Airway Equipment and Method: Stylet Placement Confirmation: ETT inserted through vocal cords under direct vision,  positive ETCO2,  CO2 detector and breath sounds checked- equal and bilateral Secured at: 23 cm Tube secured with: Tape Dental Injury: Teeth and Oropharynx as per pre-operative assessment  Difficulty Due To: Difficulty was anticipated Comments: G3 with Mac 4 by DR Rose, bougie used, easy mask.

## 2018-02-17 NOTE — Discharge Instructions (Addendum)
Dr. Gaynelle Arabian Total Joint Specialist [facility] [address] [place], [place] [address] [phone]  POSTERIOR TOTAL HIP REVISION POSTOPERATIVE DIRECTIONS  Hip Rehabilitation, Guidelines Following Surgery  The results of a hip operation are greatly improved after range of motion and muscle strengthening exercises. Follow all safety measures which are given to protect your hip. If any of these exercises cause increased pain or swelling in your joint, decrease the amount until you are comfortable again. Then slowly increase the exercises. Call your caregiver if you have problems or questions.   HOME CARE INSTRUCTIONS   Remove items at home which could result in a fall. This includes throw rugs or furniture in walking pathways.   ICE to the affected hip every three hours for 30 minutes at a time and then as needed for pain and swelling.  Continue to use ice on the hip for pain and swelling from surgery. You may notice swelling that will progress down to the foot and ankle.  This is normal after surgery.  Elevate the leg when you are not up walking on it.    Continue to use the breathing machine which will help keep your temperature down.  It is common for your temperature to cycle up and down following surgery, especially at night when you are not up moving around and exerting yourself.  The breathing machine keeps your lungs expanded and your temperature down.  DIET You may resume your previous home diet once your are discharged from the hospital.  DRESSING / WOUND CARE / SHOWERING You may shower 3 days after surgery, but keep the wounds dry during showering.  You may use an occlusive plastic wrap (Press'n Seal for example), NO SOAKING/SUBMERGING IN THE BATHTUB.  If the bandage gets wet, change with a clean dry gauze.  If the incision gets wet, pat the wound dry with a clean towel. You may start showering once you are discharged home but do not submerge the incision  under water. Just pat the incision dry and apply a dry gauze dressing on daily. Change the surgical dressing daily and reapply a dry dressing each time.  ACTIVITY Walk with your walker as instructed. Use walker as long as suggested by your caregivers. Avoid periods of inactivity such as sitting longer than an hour when not asleep. This helps prevent blood clots.  You may resume a sexual relationship in one month or when given the OK by your doctor.  You may return to work once you are cleared by your doctor.  Do not drive a car for 6 weeks or until released by you surgeon.  Do not drive while taking narcotics.  WEIGHT BEARING Weight bearing as tolerated with assist device (walker, cane, etc) as directed, use it as long as suggested by your surgeon or therapist, typically at least 4-6 weeks.  POSTOPERATIVE CONSTIPATION PROTOCOL Constipation - defined medically as fewer than three stools per week and severe constipation as less than one stool per week.  One of the most common issues patients have following surgery is constipation.  Even if you have a regular bowel pattern at home, your normal regimen is likely to be disrupted due to multiple reasons following surgery.  Combination of anesthesia, postoperative narcotics, change in appetite and fluid intake all can affect your bowels.  In order to avoid complications following surgery, here are some recommendations in order to help you during your recovery period.  Colace (docusate) - Pick up an over-the-counter form of  Colace or another stool softener and take twice a day as long as you are requiring postoperative pain medications.  Take with a full glass of water daily.  If you experience loose stools or diarrhea, hold the colace until you stool forms back up.  If your symptoms do not get better within 1 week or if they get worse, check with your doctor.  Dulcolax (bisacodyl) - Pick up over-the-counter and take as directed by the product  packaging as needed to assist with the movement of your bowels.  Take with a full glass of water.  Use this product as needed if not relieved by Colace only.   MiraLax (polyethylene glycol) - Pick up over-the-counter to have on hand.  MiraLax is a solution that will increase the amount of water in your bowels to assist with bowel movements.  Take as directed and can mix with a glass of water, juice, soda, coffee, or tea.  Take if you go more than two days without a movement. Do not use MiraLax more than once per day. Call your doctor if you are still constipated or irregular after using this medication for 7 days in a row.  If you continue to have problems with postoperative constipation, please contact the office for further assistance and recommendations.  If you experience "the worst abdominal pain ever" or develop nausea or vomiting, please contact the office immediatly for further recommendations for treatment.  ITCHING  If you experience itching with your medications, try taking only a single pain pill, or even half a pain pill at a time.  You can also use Benadryl over the counter for itching or also to help with sleep.   TED HOSE STOCKINGS Wear the elastic stockings on both legs for three weeks following surgery during the day but you may remove then at night for sleeping.  MEDICATIONS See your medication summary on the After Visit Summary that the nursing staff will review with you prior to discharge.  You may have some home medications which will be placed on hold until you complete the course of blood thinner medication.  It is important for you to complete the blood thinner medication as prescribed by your surgeon.  Continue your approved medications as instructed at time of discharge.  PRECAUTIONS If you experience chest pain or shortness of breath - call 911 immediately for transfer to the hospital emergency department.  If you develop a fever greater that 101 F, purulent drainage  from wound, increased redness or drainage from wound, foul odor from the wound/dressing, or calf pain - CONTACT YOUR SURGEON.                                                   FOLLOW-UP APPOINTMENTS Make sure you keep all of your appointments after your operation with your surgeon and caregivers. You should call the office at the above phone number and make an appointment for approximately two weeks after the date of your surgery or on the date instructed by your surgeon outlined in the "After Visit Summary".  RANGE OF MOTION AND STRENGTHENING EXERCISES  These exercises are designed to help you keep full movement of your hip joint. Follow your caregiver's or physical therapist's instructions. Perform all exercises about fifteen times, three times per day or as directed. Exercise both hips, even if you have had  only one joint replacement. These exercises can be done on a training (exercise) mat, on the floor, on a table or on a bed. Use whatever works the best and is most comfortable for you. Use music or television while you are exercising so that the exercises are a pleasant break in your day. This will make your life better with the exercises acting as a break in routine you can look forward to.   Lying on your back, slowly slide your foot toward your buttocks, raising your knee up off the floor. Then slowly slide your foot back down until your leg is straight again.   Lying on your back spread your legs as far apart as you can without causing discomfort.   Lying on your side, raise your upper leg and foot straight up from the floor as far as is comfortable. Slowly lower the leg and repeat.   Lying on your back, tighten up the muscle in the front of your thigh (quadriceps muscles). You can do this by keeping your leg straight and trying to raise your heel off the floor. This helps strengthen the largest muscle supporting your knee.   Lying on your back, tighten up the muscles of your buttocks both  with the legs straight and with the knee bent at a comfortable angle while keeping your heel on the floor.   IF YOU ARE TRANSFERRED TO A SKILLED REHAB FACILITY If the patient is transferred to a skilled rehab facility following release from the hospital, a list of the current medications will be sent to the facility for the patient to continue.  When discharged from the skilled rehab facility, please have the facility set up the patient's [place] prior to being released. Also, the skilled facility will be responsible for providing the patient with their medications at time of release from the facility to include their pain medication, the muscle relaxants, and their blood thinner medication. If the patient is still at the rehab facility at time of the two week follow up appointment, the skilled rehab facility will also need to assist the patient in arranging follow up appointment in our office and any transportation needs.  MAKE SURE YOU:   Understand these instructions.   Get help right away if you are not doing well or get worse.    Pick up stool softner and laxative for home use following surgery while on pain medications. Do not submerge incision under water. Please use good hand washing techniques while changing dressing each day. May shower starting three days after surgery. Please use a clean towel to pat the incision dry following showers. Continue to use ice for pain and swelling after surgery. Do not use any lotions or creams on the incision until instructed by your surgeon.  _____________________________________________________________  Information on my medicine - XARELTO (Rivaroxaban)  This medication education was reviewed with me or my healthcare representative as part of my discharge preparation.    Why was Xarelto prescribed for you? Xarelto was prescribed for you to reduce the risk of blood clots forming after orthopedic surgery. The medical term  for these abnormal blood clots is venous thromboembolism (VTE).  What do you need to know about xarelto ? Take your Xarelto ONCE DAILY at the same time every day. You may take it either with or without food.  If you have difficulty swallowing the tablet whole, you may crush it and mix in applesauce just prior to taking your dose.  Take Xarelto  exactly as prescribed by your doctor and DO NOT stop taking Xarelto without talking to the doctor who prescribed the medication.  Stopping without other VTE prevention medication to take the place of Xarelto may increase your risk of developing a clot.  After discharge, you should have regular check-up appointments with your healthcare provider that is prescribing your Xarelto.    What do you do if you miss a dose? If you miss a dose, take it as soon as you remember on the same day then continue your regularly scheduled once daily regimen the next day. Do not take two doses of Xarelto on the same day.   Important Safety Information A possible side effect of Xarelto is bleeding. You should call your healthcare provider right away if you experience any of the following: ? Bleeding from an injury or your nose that does not stop. ? Unusual colored urine (red or dark brown) or unusual colored stools (red or black). ? Unusual bruising for unknown reasons. ? A serious fall or if you hit your head (even if there is no bleeding).  Some medicines may interact with Xarelto and might increase your risk of bleeding while on Xarelto. To help avoid this, consult your healthcare provider or pharmacist prior to using any new prescription or non-prescription medications, including herbals, vitamins, non-steroidal anti-inflammatory drugs (NSAIDs) and supplements.  This website has more information on Xarelto: [email or web address]

## 2018-02-17 NOTE — Anesthesia Postprocedure Evaluation (Signed)
Anesthesia Post Note  Patient: [name]  Procedure(Cooper) Performed: Left total hip arthroplasty revision (Left Hip)     Patient location during evaluation: PACU Anesthesia Type: General Level of consciousness: awake and alert Pain management: pain level controlled Vital Signs Assessment: post-procedure vital signs reviewed and stable Respiratory status: spontaneous breathing, nonlabored ventilation, respiratory function stable and patient connected to nasal cannula oxygen Cardiovascular status: blood pressure returned to baseline and stable Postop Assessment: no apparent nausea or vomiting Anesthetic complications: no    Last Vitals:  Vitals:   02/17/18 1915 02/17/18 2000  BP: 122/72   Pulse: 79   Resp: 17   Temp: 36.9 C   SpO2: 98% 99%    Last Pain:  Vitals:   02/17/18 1952  TempSrc:   PainSc: 8                  [name]

## 2018-02-17 NOTE — Brief Op Note (Signed)
02/15/2018 - 02/17/2018  6:38 PM  PATIENT:  [name]  72 y.o. 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

## 2018-02-17 NOTE — Progress Notes (Signed)
PROGRESS NOTE    [name]  YTK:[id number] DOB: 11-Dec-1945 DOA: 02/15/2018 PCP: [name], MD    Brief Narrative:  72 year old male who presented with hip pain, he does have significant past medical history for hypertension, temporal arteritis and chronic left hip pain.  Patient reported worsening left hip pain, imaging showed disease around the distal portion of the femoral component of the left hip, suggesting loosening arthroplasty.  Initial physical examination blood pressure 130/68, heart rate 90, temperature 98.2, respiratory rate 18.  Oxygen saturation 97%.  Moist mucous membranes, lungs clear to auscultation bilaterally, heart S1-S2 present rhythmic, abdomen soft nontender, no lower extremity edema.  Patient was admitted to the hospital with the working diagnosis of left femur fracture.  Assessment & Plan:   Principal Problem:   Femur fracture, left (HCC) Active Problems:   Hypertension   Anemia   Femur fracture ([place])   1. Left femur fracture. Patient will be intervened by orthopedics today, continue pain control, and dvt prophylaxis, will follow on physical therapy and surgical team recommendations. Continue methadone and as needed hydromorphone.   2. Temporal arteritis. Stable with no signs of exacerbation, will continue home dose of prednisone.   3. HTN. Continue blood pressure control with amlodipine and furosemide  4, GERD. Continue pantoprazole.   5. Depression. Continue amitriptyline and venlafaxine   DVT prophylaxis: scd   Code Status: full Family Communication: no family at the bedside  Disposition Plan: pending surgical intervention    Consultants:   Orthopedics  Procedures:     Antimicrobials:       Subjective: Patient with positive pain at the left hip, worse with movement and improved with analgesics, no nausea or vomiting.   Objective: Vitals:   02/16/18 1450 02/16/18 2242 02/17/18 0456 02/17/18 1421  BP: 137/76 (!) 170/83 (!)  181/85 (!) 163/90  Pulse: 80 75 64 78  Resp: 14 18 18 16   Temp: 99.2 F (37.3 C) 99.5 F (37.5 C) 98.3 F (36.8 C) (!) 97.5 F (36.4 C)  TempSrc: Oral Oral Oral Oral  SpO2: 96% 98% 100% 94%  Weight:      Height:        Intake/Output Summary (Last 24 hours) at 02/17/2018 1428 Last data filed at 02/17/2018 1329 Gross per 24 hour  Intake -  Output 3230 ml  Net -3230 ml   Filed Weights   02/15/18 1211 02/16/18 0542  Weight: (!) 149.7 kg (330 lb) (!) 151.5 kg (334 lb)    Examination:   General: Not in pain or dyspnea, deconditioned  Neurology: Awake and alert, non focal  E ENT: mild pallor, no icterus, oral mucosa moist Cardiovascular: No JVD. S1-S2 present, rhythmic, no gallops, rubs, or murmurs. No lower extremity edema. Pulmonary: vesicular breath sounds bilaterally, adequate air movement, no wheezing, rhonchi or rales. Gastrointestinal. Abdomen protuberant with no organomegaly, non tender, no rebound or guarding Skin. No rashes Musculoskeletal: no joint deformities     Data Reviewed: I have personally reviewed following labs and imaging studies  CBC: Recent Labs  Lab 02/15/18 1249 02/16/18 0910 02/17/18 0445  WBC 9.7 8.1 9.6  NEUTROABS  --  4.6 6.6  HGB 11.6* 11.6* 11.8*  HCT 37.6* 37.2* 37.3*  MCV 96.9 98.7 96.1  PLT 156 153 751   Basic Metabolic Panel: Recent Labs  Lab 02/15/18 1249 02/16/18 0910 02/17/18 0445  NA 142 144 143  K 4.4 4.4 3.8  CL 108 108 107  CO2 29 30 30   GLUCOSE 87 87  92  BUN 27* 29* 24*  CREATININE 1.15 1.17 1.07  CALCIUM 8.6* 8.5* 8.6*   GFR: Estimated Creatinine Clearance: 91 mL/min (by C-G formula based on SCr of 1.07 mg/dL). Liver Function Tests: No results for input(s): AST, ALT, ALKPHOS, BILITOT, PROT, ALBUMIN in the last 168 hours. No results for input(s): LIPASE, AMYLASE in the last 168 hours. No results for input(s): AMMONIA in the last 168 hours. Coagulation Profile: Recent Labs  Lab 02/15/18 1249  INR 1.06    Cardiac Enzymes: No results for input(s): CKTOTAL, CKMB, CKMBINDEX, TROPONINI in the last 168 hours. BNP (last 3 results) No results for input(s): PROBNP in the last 8760 hours. HbA1C: No results for input(s): HGBA1C in the last 72 hours. CBG: No results for input(s): GLUCAP in the last 168 hours. Lipid Profile: No results for input(s): CHOL, HDL, LDLCALC, TRIG, CHOLHDL, LDLDIRECT in the last 72 hours. Thyroid Function Tests: No results for input(s): TSH, T4TOTAL, FREET4, T3FREE, THYROIDAB in the last 72 hours. Anemia Panel: No results for input(s): VITAMINB12, FOLATE, FERRITIN, TIBC, IRON, RETICCTPCT in the last 72 hours.    Radiology Studies: I have reviewed all of the imaging during this hospital visit personally     Scheduled Meds: . amitriptyline  25 mg Oral QHS  . amLODipine  10 mg Oral Daily  . aspirin EC  81 mg Oral QPM  . celecoxib  200 mg Oral BID  . cholecalciferol  2,000 Units Oral QPM  . cycloSPORINE  1 drop Both Eyes BID  . darifenacin  7.5 mg Oral Daily  . furosemide  20 mg Oral Daily  . gabapentin  600 mg Oral Daily   And  . gabapentin  900 mg Oral QHS  . methadone  10 mg Oral QID  . methocarbamol  750 mg Oral QID  . multivitamin with minerals  1 tablet Oral QPM  . pantoprazole  40 mg Oral Daily  . polyethylene glycol  17 g Oral Daily  . predniSONE  25 mg Oral Q breakfast  . senna-docusate  1 tablet Oral BID  . venlafaxine XR  300 mg Oral Q breakfast   Continuous Infusions:   LOS: 2 days        Alessandro Griep Gerome Apley, MD [facility] Pager [phone]

## 2018-02-17 NOTE — Progress Notes (Signed)
Pt. seen for CPAP placement, stated,"was able to placed on/off on own", remains on room air, made aware to notifiy if help needed.

## 2018-02-18 ENCOUNTER — Encounter (HOSPITAL_COMMUNITY): Payer: Self-pay | Admitting: Orthopedic Surgery

## 2018-02-18 DIAGNOSIS — Z6841 Body Mass Index (BMI) 40.0 and over, adult: Secondary | ICD-10-CM

## 2018-02-18 DIAGNOSIS — N179 Acute kidney failure, unspecified: Secondary | ICD-10-CM

## 2018-02-18 LAB — CBC WITH DIFFERENTIAL/PLATELET
Basophils Absolute: 0 10*3/uL (ref 0.0–0.1)
Basophils Relative: 0 %
Eosinophils Absolute: 0 10*3/uL (ref 0.0–0.7)
Eosinophils Relative: 0 %
HCT: 31 % — ABNORMAL LOW (ref 39.0–52.0)
Hemoglobin: 9.8 g/dL — ABNORMAL LOW (ref 13.0–17.0)
Lymphocytes Relative: 6 %
Lymphs Abs: 0.8 10*3/uL (ref 0.7–4.0)
MCH: 30.9 pg (ref 26.0–34.0)
MCHC: 31.6 g/dL (ref 30.0–36.0)
MCV: 97.8 fL (ref 78.0–100.0)
Monocytes Absolute: 1.5 10*3/uL — ABNORMAL HIGH (ref 0.1–1.0)
Monocytes Relative: 11 %
Neutro Abs: 11.1 10*3/uL — ABNORMAL HIGH (ref 1.7–7.7)
Neutrophils Relative %: 83 %
Platelets: 147 10*3/uL — ABNORMAL LOW (ref 150–400)
RBC: 3.17 MIL/uL — ABNORMAL LOW (ref 4.22–5.81)
RDW: 18.3 % — ABNORMAL HIGH (ref 11.5–15.5)
WBC: 13.4 10*3/uL — ABNORMAL HIGH (ref 4.0–10.5)

## 2018-02-18 LAB — BASIC METABOLIC PANEL
Anion gap: 6 (ref 5–15)
BUN: 29 mg/dL — ABNORMAL HIGH (ref 6–20)
CO2: 30 mmol/L (ref 22–32)
Calcium: 8.2 mg/dL — ABNORMAL LOW (ref 8.9–10.3)
Chloride: 104 mmol/L (ref 101–111)
Creatinine, Ser: 1.49 mg/dL — ABNORMAL HIGH (ref 0.61–1.24)
GFR calc Af Amer: 53 mL/min — ABNORMAL LOW (ref >60)
GFR calc non Af Amer: 45 mL/min — ABNORMAL LOW (ref >60)
Glucose, Bld: 168 mg/dL — ABNORMAL HIGH (ref 65–99)
Potassium: 5.3 mmol/L — ABNORMAL HIGH (ref 3.5–5.1)
Sodium: 140 mmol/L (ref 135–145)

## 2018-02-18 MED ORDER — ALUM & MAG HYDROXIDE-SIMETH 200-200-20 MG/5ML PO SUSP
15.0000 mL | Freq: Once | ORAL | Status: AC
Start: 2018-02-18 — End: 2018-02-18
  Administered 2018-02-18: 15 mL via ORAL
  Filled 2018-02-18: qty 30

## 2018-02-18 MED ORDER — FAMOTIDINE 20 MG PO TABS
20.0000 mg | ORAL_TABLET | Freq: Every day | ORAL | Status: DC
  Administered 2018-02-18 – 2018-02-22 (×5): 20 mg via ORAL
  Filled 2018-02-18 (×5): qty 1

## 2018-02-18 MED ORDER — RIVAROXABAN 10 MG PO TABS: 10.0000 mg | ORAL_TABLET | Freq: Every day | ORAL | 0 refills | Status: DC

## 2018-02-18 NOTE — Care Management Important Message (Signed)
Important Message  Patient Details  Name: [name] MRN: [id number] Date of Birth: 1946-06-15   Medicare Important Message Given:  Yes    Kerin Salen 02/18/2018, 12:02 [place] Message  Patient Details  Name: [name] MRN: [id number] Date of Birth: 1946/06/13   Medicare Important Message Given:  Yes    Kerin Salen 02/18/2018, 12:01 PM

## 2018-02-18 NOTE — Progress Notes (Signed)
   02/18/18 1000  Clinical Encounter Type  Visited With Patient  Visit Type Initial;Psychological support;Spiritual support  Referral From Nurse  Consult/Referral To Chaplain  Spiritual Encounters  Spiritual Needs Other (Comment);Literature  Stress Factors  Patient Stress Factors Other (Comment) (Advance Directive )  Advance Directives (For Healthcare)  Does Patient Have a Medical Advance Directive? No  Would patient like information on creating a medical advance directive? Yes (Inpatient - patient requests chaplain consult to create a medical advance directive) (Paperwork Given )   Advance Directive paperwork given to patient.  Please, contact Spiritual Care for further assistance when the patient would like to complete.  Chaplain Shanon Ace M.Div., [facility]

## 2018-02-18 NOTE — Op Note (Signed)
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  PROCEDURE:  Left total hip arthroplasty revision.  SURGEON:  Gaynelle Arabian, MD.  ASSISTANT:  Gerrit Halls, PA-C.  ANESTHESIA:  General.  ESTIMATED BLOOD LOSS:  750 mL.  DRAINS:  Hemovac x1.  COMPLICATIONS:  None.  CONDITION:  Stable to recovery.  BRIEF CLINICAL NOTE:  The patient is a 72 year old male who had a left total hip arthroplasty done many years ago.  He has gone on to develop femoral loosening.  He was scheduled for revision next month, but had a marked increase in pain earlier this  week.  He went to the emergency room at [facility] and was noted to have a nondisplaced periprosthetic fracture.  He was transferred to [facility] for pain management and now for revision of his left hip.  PROCEDURE IN DETAIL:  After successful administration of general anesthetic, the patient was placed in a right lateral decubitus position with the left side up and held with a hip positioner.  Left lower extremity was isolated from his perineum with  plastic drapes and prepped and draped in the usual sterile fashion.  A long posterolateral incision was made with a #10 blade through the skin and a very thick layer of subcutaneous tissue all the way down to his fascia lata.  The fascia lata was incised  in line with the skin incision.  Sciatic nerve was palpated and protected.  The posterior pseudocapsule was isolated off the femur and elevated and retracted.  There was no evidence of any fluid or wear debris in the joint.  The hip was  dislocated and  the femoral head was removed.  This was a metal-on-metal hip replacement.  Fortunately, there was no evidence of any metalosis.  The femur is an S-ROM femur and per the x-ray was loose.  We removed some overgrown bone that was overlying the edge of the  prosthesis superiorly and that is taken out with a rongeur and osteotome.  We then used the S-ROM extractor to disrupt the interface between the stem and the sleeve and fortunately the stem came out easily.  We then used flexible osteotomes to disrupt  the interface between the sleeve and proximal femur.  We got the sleeve out also with no evidence of any bone loss.  The canal was thoroughly irrigated and then long drills are passed to get past the pedestal that was created at the tip of the stem.  We  placed a long guide rod down the femoral canal to confirm that we did not perforate the femur and that we were in the canal all the way down past the isthmus.  Once this was confirmed, then we started flexible reaming up to 14 mm and then used the  solution reamers starting at a 12 mm and coursing up to 18 mm to place an 18 mm diameter stem.  We broached up to an 18 with a large body.  The femur was then retracted anteriorly to gain acetabular exposure.  Given that this was a metal-on-metal construct, I felt that we should remove the metal liner and replace it with a polyethylene liner to prevent the development of  any metalosis in the  future.  The extraction device was used to remove the liner from the acetabular shell.  The shell was in excellent position and is well fixed.  It is a 56 mm shell.  We cleared off soft tissue from around the rim of the acetabulum and then placed the 36  mm neutral +4 marathon liner into the shell, impacted that and had locked in place.  We did a trial with a 36+1.5 head and the hip was reduced with outstanding stability.  There was full extension, full external rotation 70 degrees, flexion 40 degrees,  adduction 90 degrees of internal rotation, 90 degrees of flexion, 70 degrees of  internal rotation.  By placing the left leg on top of the right it was felt as though the leg lengths were equal.  The hip was dislocated and the trial femoral trial removed.  We then placed the 8-inch straight Solution stem, which is 18 mm diameter with  a large proximal body.  I impacted that at about 20 degrees of anteversion.  It is impacted down to the level where the bone had previously been cut.  A trial of 36+1.5 head is placed and the hips were reduced with the same stability parameters.  Trial  head was removed and a permanent ceramic 36+1.5 head is placed.  The hip was reduced with outstanding stability.  The left leg is placed on top of the right and the leg lengths were equal.  Wound was copiously irrigated with saline solution and the  posterior pseudocapsule reattached to the femur through drill holes with Ethibond suture.  Fascia lata was closed over a Hemovac drain with a running 0 Stratafix suture and 40 mL of 0.25% Marcaine is injected into the subQ tissue.  SubQ was closed in  multiple layers starting with Stratafix in multiple layers with 2.0 Vicryl.  Skin was closed with staples.  Drain was hooked to suction.  Incision cleaned and dried and a bulky sterile dressing applied.  He was then awakened and transported to recovery  in stable condition.  Please note that surgical assistant was a medical necessity for this procedure.  Exposure was very difficult given the patient's morbid obesity.  An assistant was necessary for retraction of vital neurovascular structures and for  proper positioning and alignment for safe removal of the old prosthesis and safe and accurate placement of the new prosthesis.  TN/NUANCE  D:02/17/2018 T:02/18/2018 JOB:[id number]

## 2018-02-18 NOTE — Progress Notes (Signed)
  02/18/2018 1 Day Post-Op Procedure(s) (LRB): Left total hip arthroplasty revision (Left) Patient reports pain as mild.   Patient seen in rounds with Dr. Wynelle Link. Patient is well, and has had no acute complaints or problems. Denies chest pain or SOB. They will be WBAT to the left. Will begin working with therapy today.  Vital signs in last 24 hours: Temp:  [97.5 F (36.4 C)-98.8 F (37.1 C)] 98 F (36.7 C) (06/20 0419) Pulse Rate:  [70-90] 70 (06/20 0419) Resp:  [14-22] 18 (06/20 0419) BP: (102-163)/(54-90) 116/79 (06/20 0419) SpO2:  [94 %-100 %] 99 % (06/20 0419) Weight:  [148.9 kg (328 lb 4.2 oz)-151.5 kg (333 lb 15.9 oz)] 148.9 kg (328 lb 4.2 oz) (06/20 0419)  I&O's: I/O last 3 completed shifts: In: 0938 [I.V.:2798.3; IV Piggyback:646.7] Out: 1829 [HBZJI:9678; Drains:445; Blood:1025] No intake/output data recorded.  Labs: Recent Labs    02/15/18 1249 02/16/18 0910 02/17/18 0445 02/18/18 0447  HGB 11.6* 11.6* 11.8* 9.8*   Recent Labs    02/17/18 0445 02/18/18 0447  WBC 9.6 13.4*  RBC 3.88* 3.17*  HCT 37.3* 31.0*  PLT 178 147*   Recent Labs    02/17/18 0445 02/18/18 0447  NA 143 140  K 3.8 5.3*  CL 107 104  CO2 30 30  BUN 24* 29*  CREATININE 1.07 1.49*  GLUCOSE 92 168*  CALCIUM 8.6* 8.2*   Recent Labs    02/15/18 1249  INR 1.06    Exam: General - Patient is Alert and Oriented Extremity - Neurologically intact Neurovascular intact Sensation intact distally Dorsiflexion/Plantar flexion intact Dressing - dressing C/D/I Motor Function - intact, moving foot and toes well on exam.   Past Medical History:  Diagnosis Date  . Anginal pain ([place]) 2006   evaluated by cardio  . Arthritis    knees,feet,shoulders,elbows.hands  . Constipation   . Dyspnea    with exertion   . Dysrhythmia    Atrial Flutter- 2006- corredted itself  . Family history of adverse reaction to anesthesia    mother- with novocaine went into shock  . GERD (gastroesophageal  reflux disease)   . Headache   . Hemorrhoids   . History of blood transfusion   . Hypertension   . Insomnia   . Sleep apnea    cpap  . 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   Weight as of this encounter: 148.9 kg (328 lb 4.2 oz). Up with therapy  DVT Prophylaxis - Aspirin and Xarelto Weight-bearing as tolerated Hemovac pulled without difficulty. D/C O2 and pulse ox, try on room air.  Disposition: discharge per medicine  Theresa Duty, PA-C Orthopedic Surgery 02/18/2018, 8:18 AM

## 2018-02-18 NOTE — Evaluation (Signed)
Occupational Therapy Evaluation Patient Details Name: [name] MRN: [id number] DOB: [birth year] Today's Date: 02/18/2018    History of Present Illness s/p L THR, posterior approach   Clinical Impression   This 72 year old man was admitted for the above sx.  He mostly used w/c at home and was mod I for adls.  He currently needs mod +2 to stand and was unable to transfer today due to dizziness.  Pt needs reinforcement for THPs. Will follow in acute setting with min A level goals.      Follow Up Recommendations  SNF    Equipment Recommendations  None recommended by OT    Recommendations for Other Services       Precautions / Restrictions Precautions Precautions: Posterior Hip;Fall Restrictions Other Position/Activity Restrictions: WBAT      Mobility Bed Mobility Overal bed mobility: Needs Assistance Bed Mobility: Supine to Sit     Supine to sit: Mod assist;+2 for physical assistance Sit to supine: Mod assist;+2 for physical assistance   General bed mobility comments: assist for legs and trunk.  Cues for sequence getting back to bed as pt first laid straight back  Transfers Overall transfer level: Needs assistance Equipment used: Rolling walker (2 wheeled) Transfers: Sit to/from Stand Sit to Stand: Mod assist;+2 physical assistance;From elevated surface         General transfer comment: assist to rise and stabilize. Cues for UE/LE placement.      Balance                                           ADL either performed or assessed with clinical judgement   ADL Overall ADL's : Needs assistance/impaired Eating/Feeding: Independent   Grooming: Set up;Bed level   Upper Body Bathing: Set up;Bed level   Lower Body Bathing: Moderate assistance;Sit to/from stand;+2 for physical assistance;With adaptive equipment   Upper Body Dressing : Set up;Bed level   Lower Body Dressing: Moderate assistance;+2 for physical assistance;Sit to/from stand;With  adaptive equipment                 General ADL Comments: pt needs set up from bed level for UB adls vs supervision (min guard for tactile cues) if sitting EOB as he tends to lean forward and doesn't follow THPs strictly     Vision         Perception     Praxis      Pertinent Vitals/Pain Pain Assessment: Faces Faces Pain Scale: Hurts even more Pain Location: L hip Pain Descriptors / Indicators: Aching Pain Intervention(s): Limited activity within patient's tolerance;Monitored during session;Premedicated before session;Repositioned;Ice applied     Hand Dominance     Extremity/Trunk Assessment Upper Extremity Assessment Upper Extremity Assessment: Overall WFL for tasks assessed           Communication Communication Communication: No difficulties   Cognition Arousal/Alertness: Awake/alert Behavior During Therapy: WFL for tasks assessed/performed Overall Cognitive Status: No family/caregiver present to determine baseline cognitive functioning                                 General Comments: pt talks a lot; needs cues for THPs   General Comments  pt reported dizziness.  Could not get BP in standing but pt remained alert.  Decreased control/sat quicky on second trial of standing.  BP 173/113 in sitting both times we checked    Exercises     Shoulder Instructions      Home Living Family/patient expects to be discharged to:: Private residence Living Arrangements: Alone   Type of Home: House Home Access: Ramped entrance     Home Layout: One level     Bathroom Shower/Tub: Teacher, early years/pre: Handicapped height     Home Equipment: Wheelchair - [employer] - 2 wheels;Crutches;Bedside commode;Hospital bed;Other (comment)(reacher, long sponge, sock aide  NO LONG SHOEHORN.  trapeze)   Additional Comments: daughter lives across the street; works but flexible schedule.  Has been sponge bathing      Prior  Functioning/Environment                   OT Problem List: Decreased activity tolerance;Impaired balance (sitting and/or standing);Cardiopulmonary status limiting activity;Pain;Decreased knowledge of precautions;Decreased knowledge of use of DME or AE;Obesity      OT Treatment/Interventions: Self-care/ADL training;DME and/or AE instruction;Patient/family education;Balance training;Cognitive remediation/compensation    OT Goals(Current goals can be found in the care plan section) Acute Rehab OT Goals Patient Stated Goal: get back walking OT Goal Formulation: With patient Time For Goal Achievement: 03/04/18 Potential to Achieve Goals: Good ADL Goals Pt Will Transfer to Toilet: with min assist;stand pivot transfer;bedside commode(and hygiene) Additional ADL Goal #1: pt will perform LB adls with min A using AE following posterior THPS Additional ADL Goal #2: pt will not need any cues for posterior THPS during adls/toilet transfers  OT Frequency: Min 2X/week   Barriers to D/C:            Co-evaluation PT/OT/SLP Co-Evaluation/Treatment: Yes Reason for Co-Treatment: For patient/therapist safety PT goals addressed during session: Mobility/safety with mobility OT goals addressed during session: ADL's and self-care      AM-PAC PT "6 Clicks" Daily Activity     Outcome Measure Help from another person eating meals?: None Help from another person taking care of personal grooming?: A Little Help from another person toileting, which includes using toliet, bedpan, or urinal?: A Lot Help from another person bathing (including washing, rinsing, drying)?: A Lot Help from another person to put on and taking off regular upper body clothing?: A Little Help from another person to put on and taking off regular lower body clothing?: A Lot 6 Click Score: 16   End of Session Nurse Communication: Mobility status  Activity Tolerance: Patient limited by fatigue;Patient limited by pain Patient  left: in bed;with bed alarm set;with call bell/phone within reach  OT Visit Diagnosis: Unsteadiness on feet (R26.81);Muscle weakness (generalized) (M62.81);Pain Pain - Right/Left: Left Pain - part of body: Hip                Time: 1610-9604 OT Time Calculation (min): 46 min Charges:  OT General Charges $OT Visit: 1 Visit OT Evaluation $OT Eval Low Complexity: 1 Low G-Codes:     [place], OTR/L [phone] 02/18/2018  Lamarr Feenstra 02/18/2018, 12:47 PM

## 2018-02-18 NOTE — Evaluation (Signed)
Physical Therapy Evaluation Patient Details Name: [name] MRN: [id number] DOB: 22-Nov-1945 Today's Date: 02/18/2018   History of Present Illness  72 yo male admitted with L periprosthetic femur fx. S/P L THA rev-posterior 6/19. Hx of L THA, R THA + rev 2013, morbid obesity    Clinical Impression  On eval, pt required Mod-Max assist +2 for mobility. He was able to sit up at EOB and stand with Mod assist +2. He was only able to stand for ~10-15 seconds before c/o dizziness. On 2nd standing trial, pt buckled (? Near syncope) and required assistance to prevent fall. Deferred any further mobility. Max assist +2 for sit to supine. At this time, recommendation is for ST rehab at SNF, if pt will agree. Will follow and progress activity as safely able.     Follow Up Recommendations SNF    Equipment Recommendations  None recommended by PT    Recommendations for Other Services       Precautions / Restrictions Precautions Precautions: Posterior Hip;Fall Precaution Comments: Reviewed posterior precautions. Pt has dizziness and syncopal episodes Restrictions Weight Bearing Restrictions: No Other Position/Activity Restrictions: WBAT      Mobility  Bed Mobility Overal bed mobility: Needs Assistance Bed Mobility: Supine to Sit;Sit to Supine     Supine to sit: Mod assist;+2 for physical assistance;+2 for safety/equipment Sit to supine: Max assist;+2 for physical assistance;+2 for safety/equipment   General bed mobility comments: Assist for trunk and bil LEs. Utilized bedpad to aid with positioning when getting back to bed. Pt relies heavily on bedrails.   Transfers Overall transfer level: Needs assistance Equipment used: Rolling walker (2 wheeled) Transfers: Sit to/from Stand Sit to Stand: From elevated surface;Mod assist;+2 physical assistance;+2 safety/equipment         General transfer comment: x 2. Assist to rise, stabilzie, control descent. VCs safety, hand/LE placement,  technique. Pt stood for ~10-15 seconds each time before c/o dizziness. Near syncopal episode during 2nd standing trial.   Ambulation/Gait             General Gait Details: NT-pt c/o dizziness. High fall risk  Stairs            Wheelchair Mobility    Modified Rankin (Stroke Patients Only)       Balance Overall balance assessment: Needs assistance;History of Falls         Standing balance support: Bilateral upper extremity supported Standing balance-[name]: Poor                               Pertinent Vitals/Pain Pain Assessment: Faces Faces Pain Scale: Hurts even more Pain Location: L hip Pain Descriptors / Indicators: Aching;Sore;Grimacing;Discomfort Pain Intervention(s): Limited activity within patient's tolerance;Repositioned;RN gave pain meds during session;Ice applied    Home Living Family/patient expects to be discharged to:: Unsure Living Arrangements: Alone   Type of Home: House Home Access: Ramped entrance     Home Layout: One level Home Equipment: Wheelchair - [employer] - 2 wheels;Crutches;Bedside commode;Hospital bed;Other (comment) Additional Comments: daughter lives across the street; works but flexible schedule.  Has been sponge bathing    Prior Function Level of Independence: Needs assistance   Gait / Transfers Assistance Needed: primarily transfers per pt           Hand Dominance        Extremity/Trunk Assessment   Upper Extremity Assessment Upper Extremity Assessment: Defer to OT evaluation    Lower Extremity  Assessment Lower Extremity Assessment: Generalized weakness       Communication   Communication: No difficulties  Cognition Arousal/Alertness: Awake/alert Behavior During Therapy: WFL for tasks assessed/performed Overall Cognitive Status: No family/caregiver present to determine baseline cognitive functioning Area of Impairment: Safety/judgement                          Safety/Judgement: Decreased awareness of safety     General Comments: talkative; needs cues      General Comments General comments (skin integrity, edema, etc.): pt reported dizziness.  Could not get BP in standing but pt remained alert.  Decreased control/sat quicky on second trial of standing.  BP 173/113 in sitting both times we checked    Exercises     Assessment/Plan    PT Assessment Patient needs continued PT services  PT Problem List Decreased strength;Decreased balance;Decreased mobility;Decreased range of motion;Decreased activity tolerance;Decreased knowledge of use of DME;Obesity;Pain;Decreased safety awareness       PT Treatment Interventions DME instruction;Gait training;Functional mobility training;Therapeutic activities;Balance training;Patient/family education;Therapeutic exercise    PT Goals (Current goals can be found in the Care Plan section)  Acute Rehab PT Goals Patient Stated Goal: get back walking PT Goal Formulation: With patient Time For Goal Achievement: 03/04/18 Potential to Achieve Goals: Fair    Frequency Min 3X/week   Barriers to discharge        Co-evaluation   Reason for Co-Treatment: For patient/therapist safety PT goals addressed during session: Mobility/safety with mobility OT goals addressed during session: ADL's and self-care       AM-PAC PT "6 Clicks" Daily Activity  Outcome Measure Difficulty turning over in bed (including adjusting bedclothes, sheets and blankets)?: Unable Difficulty moving from lying on back to sitting on the side of the bed? : Unable Difficulty sitting down on and standing up from a chair with arms (e.g., wheelchair, bedside commode, etc,.)?: Unable Help needed moving to and from a bed to chair (including a wheelchair)?: Total Help needed walking in hospital room?: Total Help needed climbing 3-5 steps with a railing? : Total 6 Click Score: 6    End of Session Equipment Utilized During Treatment: Gait  belt Activity Tolerance: Patient limited by pain;Patient limited by fatigue(Limited by pt c/o dizziness) Patient left: in bed;with call bell/phone within reach;with bed alarm set   PT Visit Diagnosis: Muscle weakness (generalized) (M62.81);Difficulty in walking, not elsewhere classified (R26.2);Pain;History of falling (Z91.81) Pain - Right/Left: Left Pain - part of body: Hip    Time: 1020-1107 PT Time Calculation (min) (ACUTE ONLY): 47 min   Charges:   PT Evaluation $PT Eval Moderate Complexity: 1 Mod PT Treatments $Therapeutic Activity: 8-22 mins   PT G Codes:          Weston Anna, MPT Pager: [phone]

## 2018-02-18 NOTE — Progress Notes (Addendum)
PROGRESS NOTE    [name]  ZOX:[id number] DOB: [birth year] DOA: 02/15/2018 PCP: [name], MD    Brief Narrative:  72 year old male who presented with hip pain, he does have significant past medical history for hypertension, temporal arteritis and chronic left hip pain.  Patient reported worsening left hip pain, imaging showed disease around the distal portion of the femoral component of the left hip, suggesting loosening arthroplasty.  Initial physical examination blood pressure 130/68, heart rate 90, temperature 98.2, respiratory rate 18.  Oxygen saturation 97%.  Moist mucous membranes, lungs clear to auscultation bilaterally, heart S1-S2 present rhythmic, abdomen soft nontender, no lower extremity edema.  Sodium 142, potassium 4.4, chloride 108, bicarb 29, glucose 87, BUN 27, creatinine 1.15, white count 9.7, hemoglobin 9.6 hematocrit 37.6, platelets 156.  Urinalysis negative for infection, specific gravity 1.008 left hip radiograph with lucency surrounding the distal portion of the femoral component of the left hip arthroplasty digesting loosening, development of mildly displaced cortical fracture overlying the distal tip of the prosthesis in the proximal left femoral shaft.  Chest radiograph with left rotation, increased lung markings, no infiltrates.  EKG sinus rhythm with premature atrial complexes.  Patient was admitted to the hospital with the working diagnosis of left femur fracture.   Assessment & Plan:   Principal Problem:   Femur fracture, left (HCC) Active Problems:   Hypertension   Anemia   Femur fracture ([place])   1. Left femur fracture in the setting of obesity BMI 49. Patient underwent surgical procedure with no mayor complications will continue pain control with hydromorphone as needed and continue methadone plus methacarbamol. Rivaroxaban for dvt prophylaxis. Patient will need SNF at discharge. Steroids per post op protocol.   2. New pre-renal AKI with  Hyperkalemia. Renal function with worsening cr up to 1,49 from 1,07, will continue to follow renal function in am, hold on furosemide. Patient tolerating po well. Serum K at 5,3 with serum bicarbonate at 30.   2. Temporal arteritis. No acute exacerbation. Continue prednisone.   3. HTN. Will hold on Amlodipine for now to prevent hypotension. Systolic 811 to 914 mmHg.   4, GERD. Will hold on pantoprazole due to aki, continue antiacid therapy with H2 blockers.    5. Depression. On amitriptyline and venlafaxine, no confusion or agitation.   DVT prophylaxis: rivaroxaban.   Code Status: full Family Communication: no family at the bedside  Disposition Plan: pending surgical intervention    Consultants:   Orthopedics  Procedures:     Antimicrobials:       Subjective: Patient reports left hip pain with movement, no radiation, improved with analgesics, no nausea or vomiting, no chest pain or dyspnea.   Objective: Vitals:   02/17/18 2157 02/17/18 2321 02/18/18 0016 02/18/18 0419  BP: (!) 102/54 (!) 107/56 (!) 119/59 116/79  Pulse: 90 84 89 70  Resp:    18  Temp:    98 F (36.7 C)  TempSrc:    Oral  SpO2:    99%  Weight:    (!) 148.9 kg (328 lb 4.2 oz)  Height:        Intake/Output Summary (Last 24 hours) at 02/18/2018 1352 Last data filed at 02/18/2018 0557 Gross per 24 hour  Intake 3445 ml  Output 2810 ml  Net 635 ml   Filed Weights   02/16/18 0542 02/17/18 1522 02/18/18 0419  Weight: (!) 151.5 kg (334 lb) (!) 151.5 kg (333 lb 15.9 oz) (!) 148.9 kg (328 lb 4.2 oz)  Examination:   General: Not in dyspnea, deconditioned evident pain with movement.   Neurology: Awake and alert, non focal  E ENT: mild pallor, no icterus, oral mucosa moist Cardiovascular: No JVD. S1-S2 present, rhythmic, no gallops, rubs, or murmurs. Trace non pitting lower extremity edema. Pulmonary: vesicular breath sounds bilaterally, adequate air movement, no wheezing, rhonchi or  rales. Gastrointestinal. Abdomen protuberant, with no organomegaly, non tender, no rebound or guarding Skin. No rashes Musculoskeletal: no joint deformities     Data Reviewed: I have personally reviewed following labs and imaging studies  CBC: Recent Labs  Lab 02/15/18 1249 02/16/18 0910 02/17/18 0445 02/18/18 0447  WBC 9.7 8.1 9.6 13.4*  NEUTROABS  --  4.6 6.6 11.1*  HGB 11.6* 11.6* 11.8* 9.8*  HCT 37.6* 37.2* 37.3* 31.0*  MCV 96.9 98.7 96.1 97.8  PLT 156 153 178 774*   Basic Metabolic Panel: Recent Labs  Lab 02/15/18 1249 02/16/18 0910 02/17/18 0445 02/18/18 0447  NA 142 144 143 140  K 4.4 4.4 3.8 5.3*  CL 108 108 107 104  CO2 29 30 30 30   GLUCOSE 87 87 92 168*  BUN 27* 29* 24* 29*  CREATININE 1.15 1.17 1.07 1.49*  CALCIUM 8.6* 8.5* 8.6* 8.2*   GFR: Estimated Creatinine Clearance: 64.7 mL/min (A) (by C-G formula based on SCr of 1.49 mg/dL (H)). Liver Function Tests: No results for input(s): AST, ALT, ALKPHOS, BILITOT, PROT, ALBUMIN in the last 168 hours. No results for input(s): LIPASE, AMYLASE in the last 168 hours. No results for input(s): AMMONIA in the last 168 hours. Coagulation Profile: Recent Labs  Lab 02/15/18 1249  INR 1.06   Cardiac Enzymes: No results for input(s): CKTOTAL, CKMB, CKMBINDEX, TROPONINI in the last 168 hours. BNP (last 3 results) No results for input(s): PROBNP in the last 8760 hours. HbA1C: No results for input(s): HGBA1C in the last 72 hours. CBG: No results for input(s): GLUCAP in the last 168 hours. Lipid Profile: No results for input(s): CHOL, HDL, LDLCALC, TRIG, CHOLHDL, LDLDIRECT in the last 72 hours. Thyroid Function Tests: No results for input(s): TSH, T4TOTAL, FREET4, T3FREE, THYROIDAB in the last 72 hours. Anemia Panel: No results for input(s): VITAMINB12, FOLATE, FERRITIN, TIBC, IRON, RETICCTPCT in the last 72 hours.    Radiology Studies: I have reviewed all of the imaging during this hospital visit  personally     Scheduled Meds: . amitriptyline  25 mg Oral QHS  . amLODipine  10 mg Oral Daily  . aspirin EC  81 mg Oral QPM  . cycloSPORINE  1 drop Both Eyes BID  . darifenacin  7.5 mg Oral Daily  . docusate sodium  100 mg Oral BID  . gabapentin  600 mg Oral Daily   And  . gabapentin  900 mg Oral QHS  . methadone  10 mg Oral QID  . methocarbamol  750 mg Oral QID  . multivitamin with minerals  1 tablet Oral QPM  . pantoprazole  40 mg Oral Daily  . polyethylene glycol  17 g Oral Daily  . [START ON 02/19/2018] predniSONE  25 mg Oral Q breakfast  . rivaroxaban  10 mg Oral Q breakfast  . senna-docusate  1 tablet Oral BID  . venlafaxine XR  300 mg Oral Q breakfast   Continuous Infusions: . sodium chloride 100 mL/hr at 02/17/18 2101  . methocarbamol (ROBAXIN)  IV Stopped (02/17/18 1956)     LOS: 3 days        Mauricio Gerome Apley, MD [facility] Pager [phone]

## 2018-02-18 NOTE — Plan of Care (Signed)
  Problem: Pain Managment: Goal: General experience of comfort will improve Outcome: Progressing   Problem: Safety: Goal: Ability to remain free from injury will improve Outcome: Progressing   Problem: Skin Integrity: Goal: Risk for impaired skin integrity will decrease Outcome: Progressing   Problem: Spiritual Needs Goal: Ability to function at adequate level Outcome: Progressing   Problem: Clinical Measurements: Goal: Postoperative complications will be avoided or minimized Outcome: Progressing   Problem: Self-Concept: Goal: Ability to maintain and perform role responsibilities to the fullest extent possible will improve Outcome: Progressing   Problem: Pain Management: Goal: Pain level will decrease Outcome: Progressing

## 2018-02-19 DIAGNOSIS — Z6841 Body Mass Index (BMI) 40.0 and over, adult: Secondary | ICD-10-CM

## 2018-02-19 DIAGNOSIS — M979XXA Periprosthetic fracture around unspecified internal prosthetic joint, initial encounter: Secondary | ICD-10-CM

## 2018-02-19 LAB — BASIC METABOLIC PANEL
Anion gap: 7 (ref 5–15)
BUN: 39 mg/dL — ABNORMAL HIGH (ref 6–20)
CO2: 27 mmol/L (ref 22–32)
Calcium: 8.1 mg/dL — ABNORMAL LOW (ref 8.9–10.3)
Chloride: 104 mmol/L (ref 101–111)
Creatinine, Ser: 1.94 mg/dL — ABNORMAL HIGH (ref 0.61–1.24)
GFR calc Af Amer: 38 mL/min — ABNORMAL LOW (ref >60)
GFR calc non Af Amer: 33 mL/min — ABNORMAL LOW (ref >60)
Glucose, Bld: 137 mg/dL — ABNORMAL HIGH (ref 65–99)
Potassium: 4.6 mmol/L (ref 3.5–5.1)
Sodium: 138 mmol/L (ref 135–145)

## 2018-02-19 LAB — CBC WITH DIFFERENTIAL/PLATELET
Basophils Absolute: 0 10*3/uL (ref 0.0–0.1)
Basophils Relative: 0 %
Eosinophils Absolute: 0 10*3/uL (ref 0.0–0.7)
Eosinophils Relative: 0 %
HCT: 30.3 % — ABNORMAL LOW (ref 39.0–52.0)
Hemoglobin: 9.8 g/dL — ABNORMAL LOW (ref 13.0–17.0)
Lymphocytes Relative: 12 %
Lymphs Abs: 1.6 10*3/uL (ref 0.7–4.0)
MCH: 31.3 pg (ref 26.0–34.0)
MCHC: 32.3 g/dL (ref 30.0–36.0)
MCV: 96.8 fL (ref 78.0–100.0)
Monocytes Absolute: 1.7 10*3/uL — ABNORMAL HIGH (ref 0.1–1.0)
Monocytes Relative: 13 %
Neutro Abs: 9.9 10*3/uL — ABNORMAL HIGH (ref 1.7–7.7)
Neutrophils Relative %: 75 %
Platelets: 161 10*3/uL (ref 150–400)
RBC: 3.13 MIL/uL — ABNORMAL LOW (ref 4.22–5.81)
RDW: 18.6 % — ABNORMAL HIGH (ref 11.5–15.5)
WBC: 13.2 10*3/uL — ABNORMAL HIGH (ref 4.0–10.5)

## 2018-02-19 MED ORDER — SODIUM CHLORIDE 0.9 % IV SOLN
INTRAVENOUS | Status: DC
  Administered 2018-02-19 (×2): via INTRAVENOUS

## 2018-02-19 MED ORDER — RIVAROXABAN 10 MG PO TABS: 10.0000 mg | ORAL_TABLET | Freq: Every day | ORAL | 0 refills | Status: DC

## 2018-02-19 NOTE — Progress Notes (Signed)
PROGRESS NOTE    [name]  VFI:[id number] DOB: [birth year] DOA: 02/15/2018 PCP: [name], MD    Brief Narrative:  72 year old male who presented with hip pain, he does have significant past medical history for hypertension, temporal arteritis and chronic left hip pain. Patient reported worsening left hip pain, imaging showed disease around the distal portion of the femoral component of the left hip, suggesting loosening arthroplasty. Initial physical examination blood pressure 130/68, heart rate 90, temperature 98.2, respiratory rate 18. Oxygen saturation 97%. Moist mucous membranes, lungs clear to auscultation bilaterally, heart S1-S2 present rhythmic, abdomen soft nontender, no lower extremity edema.  Sodium 142, potassium 4.4, chloride 108, bicarb 29, glucose 87, BUN 27, creatinine 1.15, white count 9.7, hemoglobin 9.6 hematocrit 37.6, platelets 156.  Urinalysis negative for infection, specific gravity 1.008 left hip radiograph with lucency surrounding the distal portion of the femoral component of the left hip arthroplasty digesting loosening, development of mildly displaced cortical fracture overlying the distal tip of the prosthesis in the proximal left femoral shaft.  Chest radiograph with left rotation, increased lung markings, no infiltrates.  EKG sinus rhythm with premature atrial complexes.  Patient was admitted to the hospitalwith theworking diagnosis of left femur fracture.   Assessment & Plan:   Principal Problem:   Femur fracture, left (HCC) Active Problems:   Hypertension   Anemia   Femur fracture ([place])  1. Left femur fracture in the setting of obesity BMI 49. Continue pain control with hydromorphone as needed and continue methadone plus methacarbamol. Placed on Rivaroxaban for dvt prophylaxis. Plan for snf at discharge.   2. New pre-renal AKI with Hyperkalemia. Worsening renal function with serum cr up to 1.94 with K at 4,6 and bicarbonate at 27, will  continue to hold on nephrotoxic medications and diuretics, will add gentle saline for hydration, will follow on renal panel in am, patient with good oral intake.   2. Temporal arteritis. On prednisone with no exacerbation.   3. HTN. Continue to hold on all antihypertensive agents to prevent hypotension, will continue close blood pressure monitoring, systolic blood pressure 416 to 139 mmHg.   4, GERD. continue H2 blockers, holding proton pump inhibitors due to elevated cr.  5. Depression. Continue with amitriptyline and venlafaxine, with good toleration.   DVT prophylaxis:rivaroxaban.  Code Status:full Family Communication:no family at the bedside Disposition Plan:pending recovery of renal function, will be discharge to snf.    Consultants:  Orthopedics  Procedures:    Antimicrobials:    Subjective: Patient with persistent pain on the left hip, improved with analgesics, working with physical therapy, no nausea or vomiting, good oral intake.   Objective: Vitals:   02/18/18 0419 02/18/18 1422 02/18/18 2041 02/19/18 0653  BP: 116/79 (!) 95/48 (!) 128/54 139/60  Pulse: 70 85 86 66  Resp: 18 16 16 20   Temp: 98 F (36.7 C) 98.3 F (36.8 C) (!) 100.4 F (38 C) 97.9 F (36.6 C)  TempSrc: Oral Oral Oral Oral  SpO2: 99% 96% 96% 98%  Weight: (!) 148.9 kg (328 lb 4.2 oz)     Height:        Intake/Output Summary (Last 24 hours) at 02/19/2018 1331 Last data filed at 02/19/2018 1000 Gross per 24 hour  Intake 2958.33 ml  Output 2625 ml  Net 333.33 ml   Filed Weights   02/16/18 0542 02/17/18 1522 02/18/18 0419  Weight: (!) 151.5 kg (334 lb) (!) 151.5 kg (333 lb 15.9 oz) (!) 148.9 kg (328 lb 4.2 oz)  Examination:   General: Not in pain or dyspnea, deconditioned  Neurology: Awake and alert, non focal  E ENT: mild pallor, no icterus, oral mucosa moist Cardiovascular: No JVD. S1-S2 present, rhythmic, no gallops, rubs, or murmurs. No lower extremity  edema. Pulmonary: vesicular breath sounds bilaterally, adequate air movement, no wheezing, rhonchi or rales. Gastrointestinal. Abdomen protuberant no organomegaly, non tender, no rebound or guarding Skin. No rashes Musculoskeletal: no joint deformities     Data Reviewed: I have personally reviewed following labs and imaging studies  CBC: Recent Labs  Lab 02/15/18 1249 02/16/18 0910 02/17/18 0445 02/18/18 0447 02/19/18 0430  WBC 9.7 8.1 9.6 13.4* 13.2*  NEUTROABS  --  4.6 6.6 11.1* 9.9*  HGB 11.6* 11.6* 11.8* 9.8* 9.8*  HCT 37.6* 37.2* 37.3* 31.0* 30.3*  MCV 96.9 98.7 96.1 97.8 96.8  PLT 156 153 178 147* 025   Basic Metabolic Panel: Recent Labs  Lab 02/15/18 1249 02/16/18 0910 02/17/18 0445 02/18/18 0447 02/19/18 0430  NA 142 144 143 140 138  K 4.4 4.4 3.8 5.3* 4.6  CL 108 108 107 104 104  CO2 29 30 30 30 27   GLUCOSE 87 87 92 168* 137*  BUN 27* 29* 24* 29* 39*  CREATININE 1.15 1.17 1.07 1.49* 1.94*  CALCIUM 8.6* 8.5* 8.6* 8.2* 8.1*   GFR: Estimated Creatinine Clearance: 49.7 mL/min (A) (by C-G formula based on SCr of 1.94 mg/dL (H)). Liver Function Tests: No results for input(s): AST, ALT, ALKPHOS, BILITOT, PROT, ALBUMIN in the last 168 hours. No results for input(s): LIPASE, AMYLASE in the last 168 hours. No results for input(s): AMMONIA in the last 168 hours. Coagulation Profile: Recent Labs  Lab 02/15/18 1249  INR 1.06   Cardiac Enzymes: No results for input(s): CKTOTAL, CKMB, CKMBINDEX, TROPONINI in the last 168 hours. BNP (last 3 results) No results for input(s): PROBNP in the last 8760 hours. HbA1C: No results for input(s): HGBA1C in the last 72 hours. CBG: No results for input(s): GLUCAP in the last 168 hours. Lipid Profile: No results for input(s): CHOL, HDL, LDLCALC, TRIG, CHOLHDL, LDLDIRECT in the last 72 hours. Thyroid Function Tests: No results for input(s): TSH, T4TOTAL, FREET4, T3FREE, THYROIDAB in the last 72 hours. Anemia Panel: No  results for input(s): VITAMINB12, FOLATE, FERRITIN, TIBC, IRON, RETICCTPCT in the last 72 hours.    Radiology Studies: I have reviewed all of the imaging during this hospital visit personally     Scheduled Meds: . amitriptyline  25 mg Oral QHS  . cycloSPORINE  1 drop Both Eyes BID  . darifenacin  7.5 mg Oral Daily  . docusate sodium  100 mg Oral BID  . famotidine  20 mg Oral QHS  . gabapentin  600 mg Oral Daily   And  . gabapentin  900 mg Oral QHS  . methadone  10 mg Oral QID  . methocarbamol  750 mg Oral QID  . multivitamin with minerals  1 tablet Oral QPM  . polyethylene glycol  17 g Oral Daily  . predniSONE  25 mg Oral Q breakfast  . rivaroxaban  10 mg Oral Q breakfast  . senna-docusate  1 tablet Oral BID  . venlafaxine XR  300 mg Oral Q breakfast   Continuous Infusions: . sodium chloride 75 mL/hr at 02/19/18 1151  . methocarbamol (ROBAXIN)  IV Stopped (02/17/18 1956)     LOS: 4 days        Mauricio Gerome Apley, MD [facility] Pager [phone]

## 2018-02-19 NOTE — Progress Notes (Signed)
Physical Therapy Treatment Patient Details Name: [name] MRN: [id number] DOB: 09-May-1946 Today's Date: 02/19/2018    History of Present Illness 72 yo male admitted with L periprosthetic femur fx. S/P L THA rev-posterior 6/19. Hx of L THA, R THA + rev 2013, morbid obesity    PT Comments    Progressing slowly with mobility. Will need SNF.    Follow Up Recommendations  SNF     Equipment Recommendations  None recommended by PT    Recommendations for Other Services       Precautions / Restrictions Precautions Precautions: Posterior Hip;Fall Precaution Comments: Reviewed posterior precautions. Pt has dizziness and syncopal episodes Restrictions Weight Bearing Restrictions: No LLE Weight Bearing: Weight bearing as tolerated Other Position/Activity Restrictions: WBAT    Mobility  Bed Mobility Overal bed mobility: Needs Assistance Bed Mobility: Sit to Supine     Supine to sit: Mod assist;+2 for physical assistance;+2 for safety/equipment;HOB elevated Sit to supine: Max assist;+2 for physical assistance;+2 for safety/equipment   General bed mobility comments: Assist for trunk and bil LEs. Utilized bedpad to aid with scooting/positioning. Pt relies heavily on bedrails/trapeze. Max multimodal cueing required for safety, technique, and adherence to precautions  Transfers Overall transfer level: Needs assistance Equipment used: Rolling walker (2 wheeled) Transfers: Sit to/from Stand Sit to Stand: Mod assist;+2 physical assistance;+2 safety/equipment Stand pivot transfers: Mod assist;+2 physical assistance;+2 safety/equipment       General transfer comment: Assist to rise, stabilize, control descent. VCs safety, technique, hand/LE placement. Increased time. Pt c/o some dizziness/spinning but he was able to pivot back to bed.   Ambulation/Gait             General Gait Details: NT-pain, weakness, dizziness   Stairs             Wheelchair Mobility     Modified Rankin (Stroke Patients Only)       Balance Overall balance assessment: Needs assistance         Standing balance support: Bilateral upper extremity supported Standing balance-[name]: Poor                              Cognition Arousal/Alertness: Awake/alert Behavior During Therapy: WFL for tasks assessed/performed Overall Cognitive Status: No family/caregiver present to determine baseline cognitive functioning Area of Impairment: Safety/judgement                         Safety/Judgement: Decreased awareness of safety     General Comments: talkative; needs cues and at times resistant to therapist's instruction      Exercises      General Comments        Pertinent Vitals/Pain Pain Assessment: Faces Pain Score: 10-Worst pain ever Faces Pain Scale: Hurts whole lot Pain Location: L hip Pain Descriptors / Indicators: Aching;Sore;Grimacing;Discomfort Pain Intervention(s): Limited activity within patient's tolerance;Repositioned    Home Living                      Prior Function            PT Goals (current goals can now be found in the care plan section) Progress towards PT goals: Progressing toward goals    Frequency    Min 3X/week      PT Plan Current plan remains appropriate    Co-evaluation              AM-PAC PT "  6 Clicks" Daily Activity  Outcome Measure  Difficulty turning over in bed (including adjusting bedclothes, sheets and blankets)?: Unable Difficulty moving from lying on back to sitting on the side of the bed? : Unable Difficulty sitting down on and standing up from a chair with arms (e.g., wheelchair, bedside commode, etc,.)?: Unable Help needed moving to and from a bed to chair (including a wheelchair)?: A Lot Help needed walking in hospital room?: Total Help needed climbing 3-5 steps with a railing? : Total 6 Click Score: 7    End of Session Equipment Utilized During Treatment:  Gait belt Activity Tolerance: Patient limited by fatigue;Patient limited by pain Patient left: in bed;with call bell/phone within reach   PT Visit Diagnosis: Muscle weakness (generalized) (M62.81);Difficulty in walking, not elsewhere classified (R26.2);Pain;History of falling (Z91.81) Pain - Right/Left: Left Pain - part of body: Hip     Time: 1355-1419 PT Time Calculation (min) (ACUTE ONLY): 24 min  Charges:  $Therapeutic Activity: 23-37 mins                    G Codes:          Weston Anna, MPT Pager: [phone]

## 2018-02-19 NOTE — Progress Notes (Signed)
Physical Therapy Treatment Patient Details Name: [name] MRN: [id number] DOB: March 16, 1946 Today's Date: 02/19/2018    History of Present Illness 72 yo male admitted with L periprosthetic femur fx. S/P L THA rev-posterior 6/19. Hx of L THA, R THA + rev 2013, morbid obesity    PT Comments    Progressing slowly with mobility. Pt was able to stand and pivot to recliner on today. He continues to c/o dizziness/spinning but no reports of feeling as if he would pass out. Recommend SNF.    Follow Up Recommendations  SNF     Equipment Recommendations  None recommended by PT    Recommendations for Other Services       Precautions / Restrictions Precautions Precautions: Posterior Hip;Fall Precaution Comments: Reviewed posterior precautions. Pt has dizziness and syncopal episodes Restrictions Weight Bearing Restrictions: No Other Position/Activity Restrictions: WBAT    Mobility  Bed Mobility Overal bed mobility: Needs Assistance Bed Mobility: Supine to Sit     Supine to sit: Mod assist;+2 for physical assistance;+2 for safety/equipment;HOB elevated     General bed mobility comments: Assist for trunk and bil LEs. Utilized bedpad to aid with scooting to EOB. Pt relies heavily on bedrails. Max multimodal cueing required for safety, technique, and adherence to precautions  Transfers Overall transfer level: Needs assistance Equipment used: Rolling walker (2 wheeled) Transfers: Sit to/from [facility] Sit to Stand: Mod assist;+2 physical assistance;+2 safety/equipment;From elevated surface Stand pivot transfers: Mod assist;+2 physical assistance;+2 safety/equipment       General transfer comment: Assist to rise, stabilize, control descent. VCs safety, technique, hand/LE placement. Increased time. Pt c/o some dizziness/spinning but he was able to pivot to recliner  Ambulation/Gait             General Gait Details: NT-pain, weakness   Stairs             Wheelchair Mobility    Modified Rankin (Stroke Patients Only)       Balance Overall balance assessment: Needs assistance         Standing balance support: Bilateral upper extremity supported Standing balance-[name]: Poor                              Cognition Arousal/Alertness: Awake/alert Behavior During Therapy: WFL for tasks assessed/performed Overall Cognitive Status: No family/caregiver present to determine baseline cognitive functioning Area of Impairment: Safety/judgement                         Safety/Judgement: Decreased awareness of safety     General Comments: talkative; needs cues and at times resistant to therapist's instruction      Exercises      General Comments        Pertinent Vitals/Pain Pain Assessment: 0-10 Pain Score: 10-Worst pain ever Pain Location: L hip Pain Descriptors / Indicators: Aching;Sore;Grimacing;Discomfort Pain Intervention(s): Limited activity within patient's tolerance;Repositioned    Home Living                      Prior Function            PT Goals (current goals can now be found in the care plan section) Progress towards PT goals: Progressing toward goals(slowly)    Frequency    Min 3X/week      PT Plan Current plan remains appropriate    Co-evaluation  AM-PAC PT "6 Clicks" Daily Activity  Outcome Measure  Difficulty turning over in bed (including adjusting bedclothes, sheets and blankets)?: Unable Difficulty moving from lying on back to sitting on the side of the bed? : Unable Difficulty sitting down on and standing up from a chair with arms (e.g., wheelchair, bedside commode, etc,.)?: Unable Help needed moving to and from a bed to chair (including a wheelchair)?: A Lot Help needed walking in hospital room?: Total Help needed climbing 3-5 steps with a railing? : Total 6 Click Score: 7    End of Session Equipment Utilized During Treatment:  Gait belt Activity Tolerance: Patient limited by fatigue;Patient limited by pain Patient left: in chair;with call bell/phone within reach   PT Visit Diagnosis: Muscle weakness (generalized) (M62.81);Difficulty in walking, not elsewhere classified (R26.2);Pain;History of falling (Z91.81) Pain - Right/Left: Left Pain - part of body: Hip     Time: 1110-1136 PT Time Calculation (min) (ACUTE ONLY): 26 min  Charges:  $Therapeutic Activity: 23-37 mins                    G Codes:          Weston Anna, MPT Pager: [phone]

## 2018-02-19 NOTE — Progress Notes (Cosign Needed)
  02/19/2018 2 Days Post-Op Procedure(s) (LRB): Left total hip arthroplasty revision (Left) Patient reports pain as mild.   Patient seen in rounds for Dr. Wynelle Link. Patient is well, and has had no acute complaints or problems other than pain in the left hip. Had some issues with dizziness and ambulation yesterday with physical therapy. Will continue working with PT today. Denies chest pain or SOB.  They will be WBAT to the left.  Vital signs in last 24 hours: Temp:  [97.9 F (36.6 C)-100.4 F (38 C)] 97.9 F (36.6 C) (06/21 0653) Pulse Rate:  [66-86] 66 (06/21 0653) Resp:  [16-20] 20 (06/21 0653) BP: (95-139)/(48-60) 139/60 (06/21 0653) SpO2:  [96 %-98 %] 98 % (06/21 0653)  I&O's: I/O last 3 completed shifts: In: 4643.3 [P.O.:720; I.V.:3276.7; IV Piggyback:646.7] Out: 2060 [Urine:1615; Drains:445] Total I/O In: -  Out: 800 [Urine:800]  Labs: Recent Labs    02/16/18 0910 02/17/18 0445 02/18/18 0447 02/19/18 0430  HGB 11.6* 11.8* 9.8* 9.8*   Recent Labs    02/18/18 0447 02/19/18 0430  WBC 13.4* 13.2*  RBC 3.17* 3.13*  HCT 31.0* 30.3*  PLT 147* 161   Recent Labs    02/18/18 0447 02/19/18 0430  NA 140 138  K 5.3* 4.6  CL 104 104  CO2 30 27  BUN 29* 39*  CREATININE 1.49* 1.94*  GLUCOSE 168* 137*  CALCIUM 8.2* 8.1*   Exam: General - Patient is Alert and Oriented Extremity - Neurologically intact Neurovascular intact Sensation intact distally Dorsiflexion/Plantar flexion intact Dressing - dressing C/D/I Motor Function - intact, moving foot and toes well on exam.   Past Medical History:  Diagnosis Date  . Anginal pain ([place]) 2006   evaluated by cardio  . Arthritis    knees,feet,shoulders,elbows.hands  . Constipation   . Dyspnea    with exertion   . Dysrhythmia    Atrial Flutter- 2006- corredted itself  . Family history of adverse reaction to anesthesia    mother- with novocaine went into shock  . GERD (gastroesophageal reflux disease)   . Headache    . Hemorrhoids   . History of blood transfusion   . Hypertension   . Insomnia   . Sleep apnea    cpap  . 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   Weight as of this encounter: 148.9 kg (328 lb 4.2 oz). Up with therapy  DVT Prophylaxis - Xarelto Weight-bearing as tolerated  Disposition per medicine.  Theresa Duty, PA-C Orthopedic Surgery 02/19/2018, 8:05 AM

## 2018-02-20 DIAGNOSIS — Z6841 Body Mass Index (BMI) 40.0 and over, adult: Secondary | ICD-10-CM

## 2018-02-20 LAB — BASIC METABOLIC PANEL
Anion gap: 7 (ref 5–15)
BUN: 37 mg/dL — ABNORMAL HIGH (ref 6–20)
CO2: 25 mmol/L (ref 22–32)
Calcium: 8 mg/dL — ABNORMAL LOW (ref 8.9–10.3)
Chloride: 107 mmol/L (ref 101–111)
Creatinine, Ser: 1.53 mg/dL — ABNORMAL HIGH (ref 0.61–1.24)
GFR calc Af Amer: 51 mL/min — ABNORMAL LOW (ref >60)
GFR calc non Af Amer: 44 mL/min — ABNORMAL LOW (ref >60)
Glucose, Bld: 85 mg/dL (ref 65–99)
Potassium: 4.7 mmol/L (ref 3.5–5.1)
Sodium: 139 mmol/L (ref 135–145)

## 2018-02-20 MED ORDER — HYDROMORPHONE HCL 1 MG/ML IJ SOLN
1.0000 mg | INTRAMUSCULAR | Status: DC | PRN
  Administered 2018-02-20 – 2018-02-22 (×6): 1 mg via INTRAVENOUS
  Filled 2018-02-20 (×6): qty 1

## 2018-02-20 MED ORDER — CYCLOBENZAPRINE HCL 5 MG PO TABS
5.0000 mg | ORAL_TABLET | Freq: Three times a day (TID) | ORAL | Status: DC | PRN
  Administered 2018-02-20 – 2018-02-23 (×4): 5 mg via ORAL
  Filled 2018-02-20 (×5): qty 1

## 2018-02-20 NOTE — Progress Notes (Signed)
    Subjective:  Patient reports pain as moderate to severe.  Denies N/V/CP/SOB. C/o L hip pain.  Objective:   VITALS:   Vitals:   02/19/18 0653 02/19/18 1431 02/20/18 0446 02/20/18 0611  BP: 139/60 (!) 131/49 (!) 141/62   Pulse: 66 77 77   Resp: 20 18 18    Temp: 97.9 F (36.6 C) 99 F (37.2 C) 99.1 F (37.3 C)   TempSrc: Oral Oral Oral   SpO2: 98% 100% 97%   Weight:    (!) 156 kg (343 lb 14.7 oz)  Height:        NAD ABD soft Sensation intact distally Intact pulses distally Dorsiflexion/Plantar flexion intact Incision: scant drainage Dressing changed.   Lab Results  Component Value Date   WBC 13.2 (H) 02/19/2018   HGB 9.8 (L) 02/19/2018   HCT 30.3 (L) 02/19/2018   MCV 96.8 02/19/2018   PLT 161 02/19/2018   BMET    Component Value Date/Time   NA 139 02/20/2018 0426   K 4.7 02/20/2018 0426   CL 107 02/20/2018 0426   CO2 25 02/20/2018 0426   GLUCOSE 85 02/20/2018 0426   BUN 37 (H) 02/20/2018 0426   CREATININE 1.53 (H) 02/20/2018 0426   CALCIUM 8.0 (L) 02/20/2018 0426   GFRNONAA 44 (L) 02/20/2018 0426   GFRAA 51 (L) 02/20/2018 0426     Assessment/Plan: 3 Days Post-Op   Principal Problem:   Femur fracture, left (HCC) Active Problems:   Hypertension   Anemia   Femur fracture (HCC)   WBAT with walker DVT ppx: xarelto, SCDs, TEDS PO pain control PT/OT Dispo: D/C planning   [name] 02/20/2018, 8:15 AM   Rod Can, MD Cell [phone]

## 2018-02-20 NOTE — Progress Notes (Signed)
PT Cancellation Note  Patient Details Name: [name] MRN: [id number] DOB: June 07, 1946   Cancelled Treatment:    Reason Eval/Treat Not Completed: Pt was on schedule for PT tx session. Apparently pt was assisted onto [facility] by nursing however pt was unable to stand from commode after sitting for a while (pt stated legs were numb). Nursing paged PT for assistance. Once we arrived, nursing was in process of using lift to get pt off bsc then back to bed. Will defer PT today and check back another day.    Weston Anna, MPT Pager: [phone]

## 2018-02-20 NOTE — Plan of Care (Signed)
  Problem: Pain Managment: Goal: General experience of comfort will improve Outcome: Progressing   Problem: Safety: Goal: Ability to remain free from injury will improve Outcome: Progressing   Problem: Education: Goal: Verbalization of understanding the information provided (i.e., activity precautions, restrictions, etc) will improve Outcome: Progressing   Problem: Clinical Measurements: Goal: Postoperative complications will be avoided or minimized Outcome: Progressing   Problem: Pain Management: Goal: Pain level will decrease Outcome: Progressing

## 2018-02-20 NOTE — Progress Notes (Signed)
PROGRESS NOTE    [name]  EZM:[id number] DOB: [birth year] DOA: 02/15/2018 PCP: [name], MD    Brief Narrative:  72 year old male who presented with hip pain, he does have significant past medical history for hypertension, temporal arteritis and chronic left hip pain. Patient reported worsening left hip pain, imaging showed disease around the distal portion of the femoral component of the left hip, suggesting loosening arthroplasty. Initial physical examination blood pressure 130/68, heart rate 90, temperature 98.2, respiratory rate 18. Oxygen saturation 97%. Moist mucous membranes, lungs clear to auscultation bilaterally, heart S1-S2 present rhythmic, abdomen soft nontender, no lower extremity edema.Sodium 142, potassium 4.4, chloride 108, bicarb 29, glucose 87, BUN 27, creatinine 1.15, white count 9.7, hemoglobin 9.6 hematocrit 37.6, platelets 156.Urinalysis negative for infection,specific gravity 1.008left hip radiograph with lucency surrounding the distal portion of the femoral component of the left hip arthroplasty digesting loosening,development of mildly displaced cortical fracture overlying the distal tip of the prosthesis in the proximal left femoral shaft.Chest radiograph with left rotation, increased lung markings, no infiltrates. EKG sinus rhythm with premature atrial complexes.  Patient was admitted to the hospitalwith theworking diagnosis of left femur fracture.   Assessment & Plan:   Principal Problem:   Femur fracture, left (HCC) Active Problems:   Hypertension   Anemia   Femur fracture ([place])   1. Left femur fracturein the setting of obesity BMI 49. reports worsening left hip pain, will increase dose of hydromorphone to 1 mg as needed q 4 hours and add flexeril continue methadone and scheduled methacarbamol. Pending discharge to snf.  2. New pre-renal AKI with Hyperkalemia. Improved renal function after hydration with IV fluids, serum cr down  to 1,59, with K at 4,7, and serum bicarbonate at 25. Will follow on renal panel in am, hold on IV fluids and diuretics for now.   2. Temporal arteritis.continue prednisone. No clinica signs of exacerbation.   3. HTN.Stable blood pressure, continue to hold on antihypertensive medications.    4, GERD.On H2 blockers, with good toleration  5. Depression.Onamitriptyline and venlafaxine, no confusion or agitation  6. Morbid obesity and OSA. Will continue c pap at night, calculated BMI 49  DVT prophylaxis:rivaroxaban. Code Status:full Family Communication:no family at the bedside Disposition Plan:pending recovery of renal function, will be discharge to snf.    Consultants:  Orthopedics  Procedures:    Antimicrobials:     Subjective: Patient with severe pain on right hip with only mild improvement after analgesics, worse with movement, 10/10 intensity, associated with muscle cramps. Positive bowel movement.   Objective: Vitals:   02/19/18 0653 02/19/18 1431 02/20/18 0446 02/20/18 0611  BP: 139/60 (!) 131/49 (!) 141/62   Pulse: 66 77 77   Resp: 20 18 18    Temp: 97.9 F (36.6 C) 99 F (37.2 C) 99.1 F (37.3 C)   TempSrc: Oral Oral Oral   SpO2: 98% 100% 97%   Weight:    (!) 156 kg (343 lb 14.7 oz)  Height:        Intake/Output Summary (Last 24 hours) at 02/20/2018 1127 Last data filed at 02/20/2018 0502 Gross per 24 hour  Intake 1767.5 ml  Output 1270 ml  Net 497.5 ml   Filed Weights   02/17/18 1522 02/18/18 0419 02/20/18 0611  Weight: (!) 151.5 kg (333 lb 15.9 oz) (!) 148.9 kg (328 lb 4.2 oz) (!) 156 kg (343 lb 14.7 oz)    Examination:   General: deconditioned  Neurology: Awake and alert, non focal  E  ENT: no pallor, no icterus, oral mucosa moist Cardiovascular: No JVD. S1-S2 present, rhythmic, no gallops, rubs, or murmurs. No lower extremity edema. Pulmonary: vesicular breath sounds bilaterally, adequate air movement, no wheezing,  rhonchi or rales. Gastrointestinal. Abdomen protuberant no organomegaly, non tender, no rebound or guarding Skin. Mild ecchymosis at the right hip region.  Musculoskeletal: no joint deformities/      Data Reviewed: I have personally reviewed following labs and imaging studies  CBC: Recent Labs  Lab 02/15/18 1249 02/16/18 0910 02/17/18 0445 02/18/18 0447 02/19/18 0430  WBC 9.7 8.1 9.6 13.4* 13.2*  NEUTROABS  --  4.6 6.6 11.1* 9.9*  HGB 11.6* 11.6* 11.8* 9.8* 9.8*  HCT 37.6* 37.2* 37.3* 31.0* 30.3*  MCV 96.9 98.7 96.1 97.8 96.8  PLT 156 153 178 147* 732   Basic Metabolic Panel: Recent Labs  Lab 02/16/18 0910 02/17/18 0445 02/18/18 0447 02/19/18 0430 02/20/18 0426  NA 144 143 140 138 139  K 4.4 3.8 5.3* 4.6 4.7  CL 108 107 104 104 107  CO2 30 30 30 27 25   GLUCOSE 87 92 168* 137* 85  BUN 29* 24* 29* 39* 37*  CREATININE 1.17 1.07 1.49* 1.94* 1.53*  CALCIUM 8.5* 8.6* 8.2* 8.1* 8.0*   GFR: Estimated Creatinine Clearance: 64.8 mL/min (A) (by C-G formula based on SCr of 1.53 mg/dL (H)). Liver Function Tests: No results for input(s): AST, ALT, ALKPHOS, BILITOT, PROT, ALBUMIN in the last 168 hours. No results for input(s): LIPASE, AMYLASE in the last 168 hours. No results for input(s): AMMONIA in the last 168 hours. Coagulation Profile: Recent Labs  Lab 02/15/18 1249  INR 1.06   Cardiac Enzymes: No results for input(s): CKTOTAL, CKMB, CKMBINDEX, TROPONINI in the last 168 hours. BNP (last 3 results) No results for input(s): PROBNP in the last 8760 hours. HbA1C: No results for input(s): HGBA1C in the last 72 hours. CBG: No results for input(s): GLUCAP in the last 168 hours. Lipid Profile: No results for input(s): CHOL, HDL, LDLCALC, TRIG, CHOLHDL, LDLDIRECT in the last 72 hours. Thyroid Function Tests: No results for input(s): TSH, T4TOTAL, FREET4, T3FREE, THYROIDAB in the last 72 hours. Anemia Panel: No results for input(s): VITAMINB12, FOLATE, FERRITIN, TIBC,  IRON, RETICCTPCT in the last 72 hours.    Radiology Studies: I have reviewed all of the imaging during this hospital visit personally     Scheduled Meds: . amitriptyline  25 mg Oral QHS  . cycloSPORINE  1 drop Both Eyes BID  . darifenacin  7.5 mg Oral Daily  . docusate sodium  100 mg Oral BID  . famotidine  20 mg Oral QHS  . gabapentin  600 mg Oral Daily   And  . gabapentin  900 mg Oral QHS  . methadone  10 mg Oral QID  . methocarbamol  750 mg Oral QID  . multivitamin with minerals  1 tablet Oral QPM  . polyethylene glycol  17 g Oral Daily  . predniSONE  25 mg Oral Q breakfast  . rivaroxaban  10 mg Oral Q breakfast  . senna-docusate  1 tablet Oral BID  . venlafaxine XR  300 mg Oral Q breakfast   Continuous Infusions: . sodium chloride 75 mL/hr at 02/19/18 2228  . methocarbamol (ROBAXIN)  IV Stopped (02/17/18 1956)     LOS: 5 days        Mauricio Gerome Apley, MD [facility] Pager [phone]

## 2018-02-21 LAB — BASIC METABOLIC PANEL
Anion gap: 5 (ref 5–15)
BUN: 33 mg/dL — ABNORMAL HIGH (ref 6–20)
CO2: 28 mmol/L (ref 22–32)
Calcium: 8 mg/dL — ABNORMAL LOW (ref 8.9–10.3)
Chloride: 109 mmol/L (ref 101–111)
Creatinine, Ser: 1.32 mg/dL — ABNORMAL HIGH (ref 0.61–1.24)
GFR calc Af Amer: >60 mL/min (ref >60)
GFR calc non Af Amer: 53 mL/min — ABNORMAL LOW (ref >60)
Glucose, Bld: 81 mg/dL (ref 65–99)
Potassium: 5.3 mmol/L — ABNORMAL HIGH (ref 3.5–5.1)
Sodium: 142 mmol/L (ref 135–145)

## 2018-02-21 NOTE — NC FL2 (Signed)
[place] LEVEL OF CARE SCREENING TOOL     IDENTIFICATION  Patient Name: [name] Birthdate: [birth year] Sex: male Admission Date (Current Location): 02/15/2018  [facility] and [place] Number:  [employer] manufacturing systems and Address:  [facility],  [place], [place]      Provider Number: [id number]  Attending Physician Name and Address:  Tawni Millers  Relative Name and Phone Number:       Current Level of Care: Hospital Recommended Level of Care: [place] Prior Approval Number:    Date Approved/Denied:   PASRR Number: [id number]  Discharge Plan: SNF    Current Diagnoses: Patient Active Problem List   Diagnosis Date Noted  . Femur fracture, left ([place]) 02/15/2018  . Anemia 02/15/2018  . Femur fracture ([place]) 02/15/2018  . 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 SOB (shortness of breath) 03/31/2014  . Hypertension   . Hyponatremia 03/18/2012  . 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    Orientation RESPIRATION BLADDER Height & Weight     Self, Time, Situation, Place  Normal Continent Weight: (!) 343 lb 14.7 oz (156 kg)(Could not move the pillows supporting his leg due to his pai) Height:  5\' 8"  (172.7 cm)  BEHAVIORAL SYMPTOMS/MOOD NEUROLOGICAL BOWEL NUTRITION STATUS      Continent Diet(regular)  AMBULATORY STATUS COMMUNICATION OF NEEDS Skin   Extensive Assist Verbally Surgical wounds(closed incision L hip, gauze)                       Personal Care Assistance Level of Assistance  Bathing, Feeding, Dressing Bathing Assistance: Maximum assistance Feeding assistance: Independent Dressing Assistance: Limited assistance     Functional Limitations Info  Sight, Hearing, Speech Sight Info: Adequate Hearing Info: Adequate Speech Info: Adequate    SPECIAL CARE FACTORS FREQUENCY  PT (By licensed PT), OT (By licensed OT)     PT Frequency: 5x OT Frequency: 5x             Contractures Contractures Info: Not present    Additional Factors Info  Code Status, Allergies Code Status Info: full code Allergies Info: bee venom           Current Medications (02/21/2018):  This is the current hospital active medication list Current Facility-Administered Medications  Medication Dose Route Frequency Provider Last Rate Last Dose  . acetaminophen (TYLENOL) tablet 650 mg  650 mg Oral Q6H PRN Gaynelle Arabian, MD   650 mg at 02/19/18 1534   Or  . acetaminophen (TYLENOL) suppository 650 mg  650 mg Rectal Q6H PRN Aluisio, Pilar Plate, MD      . amitriptyline (ELAVIL) tablet 25 mg  25 mg Oral QHS Gaynelle Arabian, MD   25 mg at 02/20/18 2156  . bisacodyl (DULCOLAX) suppository 10 mg  10 mg Rectal Daily PRN Gaynelle Arabian, MD      . cyclobenzaprine (FLEXERIL) tablet 5 mg  5 mg Oral TID PRN Arrien, Jimmy Picket, MD   5 mg at 02/20/18 2345  . cycloSPORINE (RESTASIS) 0.05 % ophthalmic emulsion 1 drop  1 drop Both Eyes BID Gaynelle Arabian, MD   1 drop at 02/21/18 0936  . darifenacin (ENABLEX) 24 hr tablet 7.5 mg  7.5 mg Oral Daily Gaynelle Arabian, MD   7.5 mg at 02/21/18 0936  . diphenhydrAMINE (BENADRYL) 12.5 MG/5ML elixir 12.5-25 mg  12.5-25 mg Oral Q4H PRN Gaynelle Arabian, MD      . diphenhydrAMINE (BENADRYL) capsule  25-50 mg  25-50 mg Oral Q8H PRN Aluisio, Pilar Plate, MD      . docusate sodium (COLACE) capsule 100 mg  100 mg Oral BID Gaynelle Arabian, MD   100 mg at 02/21/18 0935  . famotidine (PEPCID) tablet 20 mg  20 mg Oral QHS Tawni Millers, MD   20 mg at 02/20/18 2157  . gabapentin (NEURONTIN) capsule 600 mg  600 mg Oral Daily Aluisio, Pilar Plate, MD   600 mg at 02/21/18 0935   And  . gabapentin (NEURONTIN) capsule 900 mg  900 mg Oral QHS Gaynelle Arabian, MD   900 mg at 02/20/18 2156  . hydrALAZINE (APRESOLINE) injection 10 mg  10 mg Intravenous Q4H PRN Aluisio, Pilar Plate, MD      . HYDROmorphone (DILAUDID) injection 1 mg  1 mg Intravenous Q4H PRN Arrien, Jimmy Picket,  MD   1 mg at 02/21/18 0547  . menthol-cetylpyridinium (CEPACOL) lozenge 3 mg  1 lozenge Oral PRN Aluisio, Pilar Plate, MD       Or  . phenol (CHLORASEPTIC) mouth spray 1 spray  1 spray Mouth/Throat PRN Aluisio, Pilar Plate, MD      . methadone (DOLOPHINE) tablet 10 mg  10 mg Oral QID Gaynelle Arabian, MD   10 mg at 02/21/18 0935  . methocarbamol (ROBAXIN) tablet 750 mg  750 mg Oral QID Gaynelle Arabian, MD   750 mg at 02/21/18 0935  . metoCLOPramide (REGLAN) tablet 5-10 mg  5-10 mg Oral Q8H PRN Aluisio, Pilar Plate, MD       Or  . metoCLOPramide (REGLAN) injection 5-10 mg  5-10 mg Intravenous Q8H PRN Aluisio, Pilar Plate, MD      . multivitamin with minerals tablet 1 tablet  1 tablet Oral QPM Gaynelle Arabian, MD   1 tablet at 02/20/18 1741  . ondansetron (ZOFRAN) tablet 4 mg  4 mg Oral Q6H PRN Aluisio, Pilar Plate, MD       Or  . ondansetron (ZOFRAN) injection 4 mg  4 mg Intravenous Q6H PRN Aluisio, Pilar Plate, MD      . polyethylene glycol (MIRALAX / GLYCOLAX) packet 17 g  17 g Oral Daily Gaynelle Arabian, MD   17 g at 02/20/18 0944  . polyethylene glycol (MIRALAX / GLYCOLAX) packet 17 g  17 g Oral Daily PRN Aluisio, Pilar Plate, MD      . predniSONE (DELTASONE) tablet 25 mg  25 mg Oral Q breakfast Arrien, Jimmy Picket, MD   25 mg at 02/21/18 0744  . rivaroxaban (XARELTO) tablet 10 mg  10 mg Oral Q breakfast Gaynelle Arabian, MD   10 mg at 02/21/18 0744  . senna-docusate (Senokot-S) tablet 1 tablet  1 tablet Oral BID Gaynelle Arabian, MD   1 tablet at 02/21/18 0935  . sodium phosphate (FLEET) 7-19 GM/118ML enema 1 enema  1 enema Rectal Once PRN Aluisio, Pilar Plate, MD      . venlafaxine XR (EFFEXOR-XR) 24 hr capsule 300 mg  300 mg Oral Q breakfast Gaynelle Arabian, MD   300 mg at 02/21/18 3785     Discharge Medications: Please see discharge summary for a list of discharge medications.  Relevant Imaging Results:  Relevant Lab Results:   Additional Information SS# [id number]. Weight 343 lb  Nila Nephew, LCSW

## 2018-02-21 NOTE — Progress Notes (Signed)
Pt checked frequently, rested quietly at long intervals with eyes closed, resp even and unlabored, CPAP intact at night for use. NAD noted, no signs of resp distress noted. Agree with previous nursing assessment, no changes noted in patient status. Uneventful night. 

## 2018-02-21 NOTE — Progress Notes (Signed)
PROGRESS NOTE    OSMEL DYKSTRA  XQJ:[id number] DOB: 10/31/1945 DOA: 02/15/2018 PCP: [name], MD    Brief Narrative:  72 year old male who presented with hip pain, he does have significant past medical history for hypertension, temporal arteritis and chronic left hip pain. Patient reported worsening left hip pain, imaging showed disease around the distal portion of the femoral component of the left hip, suggesting loosening arthroplasty. Initial physical examination blood pressure 130/68, heart rate 90, temperature 98.2, respiratory rate 18. Oxygen saturation 97%. Moist mucous membranes, lungs clear to auscultation bilaterally, heart S1-S2 present rhythmic, abdomen soft nontender, no lower extremity edema.Sodium 142, potassium 4.4, chloride 108, bicarb 29, glucose 87, BUN 27, creatinine 1.15, white count 9.7, hemoglobin 9.6 hematocrit 37.6, platelets 156.Urinalysis negative for infection,specific gravity 1.008left hip radiograph with lucency surrounding the distal portion of the femoral component of the left hip arthroplasty digesting loosening,development of mildly displaced cortical fracture overlying the distal tip of the prosthesis in the proximal left femoral shaft.Chest radiograph with left rotation, increased lung markings, no infiltrates. EKG sinus rhythm with premature atrial complexes.  Patient was admitted to the hospitalwith theworking diagnosis of left femur fracture.    Assessment & Plan:   Principal Problem:   Femur fracture, left (HCC) Active Problems:   Hypertension   Anemia   Femur fracture ([place])  1. Left femur fracturein the setting of obesity BMI 49.improved pain control with hydromorphone  1 mg as needed q 4 hours and  Flexeril. Patient on methadone and scheduled methacarbamol. Stable for discharge to SNF.  2. New pre-renal AKI with Hyperkalemia. Continue to improve renal function, now with serum cr down to 1,32 with K at 5,3 and serum  bicarbonate at 28. Will continue to hold on furosemide, patient is tolerating po well.    2. Temporal arteritis.Omprednisone with good toleration  3. HTN.Off blood pressure medications.   4, GERD.toelrating po well, continue antiacid therapy.   5. Depression.Continue withamitriptyline and venlafaxine.   6. Morbid obesity and OSA. BMI 49.  c pap at night.   DVT prophylaxis:rivaroxaban. Code Status:full Family Communication:no family at the bedside Disposition Plan:pending placement at SNF   Consultants:  Orthopedics  Procedures:    Antimicrobials:   Subjective: Patient is feeling better, pain has improved along with muscle cramps with the use of muscle relaxants, no nausea or vomiting.   Objective: Vitals:   02/20/18 0611 02/20/18 1402 02/20/18 2233 02/21/18 0537  BP:  (!) 121/57 (!) 162/75 (!) 162/61  Pulse:  84 76 75  Resp:  18 18 18   Temp:  99.9 F (37.7 C) 98.2 F (36.8 C) 98.4 F (36.9 C)  TempSrc:  Oral Oral Oral  SpO2:  97% 99% 100%  Weight: (!) 156 kg (343 lb 14.7 oz)     Height:        Intake/Output Summary (Last 24 hours) at 02/21/2018 1113 Last data filed at 02/21/2018 0806 Gross per 24 hour  Intake 960 ml  Output 1025 ml  Net -65 ml   Filed Weights   02/17/18 1522 02/18/18 0419 02/20/18 0611  Weight: (!) 151.5 kg (333 lb 15.9 oz) (!) 148.9 kg (328 lb 4.2 oz) (!) 156 kg (343 lb 14.7 oz)    Examination:   General: Not in pain or dyspnea, deconditioned  Neurology: Awake and alert, non focal  E ENT: mild pallor, no icterus, oral mucosa moist Cardiovascular: No JVD. S1-S2 present, rhythmic, no gallops, rubs, or murmurs. Non pitting lower extremity edema. Pulmonary: vesicular breath  sounds bilaterally, adequate air movement, no wheezing, rhonchi or rales. Gastrointestinal. Abdomen protuberant no organomegaly, non tender, no rebound or guarding Skin. Left hip stable ecchymosis Musculoskeletal: no joint  deformities     Data Reviewed: I have personally reviewed following labs and imaging studies  CBC: Recent Labs  Lab 02/15/18 1249 02/16/18 0910 02/17/18 0445 02/18/18 0447 02/19/18 0430  WBC 9.7 8.1 9.6 13.4* 13.2*  NEUTROABS  --  4.6 6.6 11.1* 9.9*  HGB 11.6* 11.6* 11.8* 9.8* 9.8*  HCT 37.6* 37.2* 37.3* 31.0* 30.3*  MCV 96.9 98.7 96.1 97.8 96.8  PLT 156 153 178 147* 539   Basic Metabolic Panel: Recent Labs  Lab 02/17/18 0445 02/18/18 0447 02/19/18 0430 02/20/18 0426 02/21/18 0408  NA 143 140 138 139 142  K 3.8 5.3* 4.6 4.7 5.3*  CL 107 104 104 107 109  CO2 30 30 27 25 28   GLUCOSE 92 168* 137* 85 81  BUN 24* 29* 39* 37* 33*  CREATININE 1.07 1.49* 1.94* 1.53* 1.32*  CALCIUM 8.6* 8.2* 8.1* 8.0* 8.0*   GFR: Estimated Creatinine Clearance: 75.1 mL/min (A) (by C-G formula based on SCr of 1.32 mg/dL (H)). Liver Function Tests: No results for input(s): AST, ALT, ALKPHOS, BILITOT, PROT, ALBUMIN in the last 168 hours. No results for input(s): LIPASE, AMYLASE in the last 168 hours. No results for input(s): AMMONIA in the last 168 hours. Coagulation Profile: Recent Labs  Lab 02/15/18 1249  INR 1.06   Cardiac Enzymes: No results for input(s): CKTOTAL, CKMB, CKMBINDEX, TROPONINI in the last 168 hours. BNP (last 3 results) No results for input(s): PROBNP in the last 8760 hours. HbA1C: No results for input(s): HGBA1C in the last 72 hours. CBG: No results for input(s): GLUCAP in the last 168 hours. Lipid Profile: No results for input(s): CHOL, HDL, LDLCALC, TRIG, CHOLHDL, LDLDIRECT in the last 72 hours. Thyroid Function Tests: No results for input(s): TSH, T4TOTAL, FREET4, T3FREE, THYROIDAB in the last 72 hours. Anemia Panel: No results for input(s): VITAMINB12, FOLATE, FERRITIN, TIBC, IRON, RETICCTPCT in the last 72 hours.    Radiology Studies: I have reviewed all of the imaging during this hospital visit personally     Scheduled Meds: . amitriptyline  25  mg Oral QHS  . cycloSPORINE  1 drop Both Eyes BID  . darifenacin  7.5 mg Oral Daily  . docusate sodium  100 mg Oral BID  . famotidine  20 mg Oral QHS  . gabapentin  600 mg Oral Daily   And  . gabapentin  900 mg Oral QHS  . methadone  10 mg Oral QID  . methocarbamol  750 mg Oral QID  . multivitamin with minerals  1 tablet Oral QPM  . polyethylene glycol  17 g Oral Daily  . predniSONE  25 mg Oral Q breakfast  . rivaroxaban  10 mg Oral Q breakfast  . senna-docusate  1 tablet Oral BID  . venlafaxine XR  300 mg Oral Q breakfast   Continuous Infusions:   LOS: 6 days        Aaliyan Brinkmeier Gerome Apley, MD [facility] Pager [phone]

## 2018-02-21 NOTE — Clinical Social Work Note (Signed)
Clinical Social Work Assessment  Patient Details  Name: [name] MRN: [id number] Date of Birth: 26-Oct-1945  Date of referral:  02/21/18               Reason for consult:  Facility Placement                Permission sought to share information with:    Permission granted to share information::     Name::        Agency::     Relationship::     Contact Information:     Housing/Transportation Living arrangements for the past 2 months:  Single Family Home Source of Information:  Patient Patient Interpreter Needed:  None Criminal Activity/Legal Involvement Pertinent to Current Situation/Hospitalization:  No - Comment as needed Significant Relationships:  Adult Children, Friend Lives with:  Self Do you feel safe going back to the place where you live?  Yes Need for family participation in patient care:  No (Coment)  Care giving concerns:  Pt admitted from home where he resides alone, daughter lives nearby and assists him with care. Pt fell at home and fractured femur, now post-op. Reports prior to fall was having issues ambulating and was using wheelchair and walker, transferring alone.    Social Worker assessment / plan:  CSW consulted to assist with SNF placement. Met with pt at bedside- alert and oriented, reports he understands recommendation for SNF for short term rehab and has been to [facility] several years ago- would like this SNF again if available. Completed FL2 and referrals- will follow up with bed offers. Will need facility to initiate [facility] Medicare authorization request once facility confirmed.   Employment status:  Retired Engineer, maintenance (IT)) PT Recommendations:  [place] / Referral to community resources:  [place]  Patient/Family's Response to care:  Appreciative  Patient/Family's Understanding of and Emotional Response to Diagnosis, Current Treatment, and Prognosis:  Pt  shows good understanding of treatment, described his injury and surgery to CSW- reports "I don't know what I would've done if I hadn't had this surgery. Before I came in the hospital I thought I'd be okay, but then my pain got worse and worse and I couldn't move"  Emotional Assessment Appearance:  Appears stated age Attitude/Demeanor/Rapport:  Engaged Affect (typically observed):  Pleasant, Accepting, Adaptable Orientation:  Oriented to Self, Oriented to Place, Oriented to  Time, Oriented to Situation Alcohol / Substance use:  Not Applicable Psych involvement (Current and /or in the community):  No (Comment)  Discharge Needs  Concerns to be addressed:  Discharge Planning Concerns Readmission within the last 30 days:  No Current discharge risk:  Dependent with Mobility Barriers to Discharge:  Continued Medical Work up, [employer], LCSW 02/21/2018, 10:25 AM  [phone] weekend coverage for [phone]

## 2018-02-21 NOTE — Progress Notes (Signed)
    Subjective:  Patient reports pain as moderate to severe.  Denies N/V/CP/SOB. C/o L thigh pain.  Objective:   VITALS:   Vitals:   02/20/18 0611 02/20/18 1402 02/20/18 2233 02/21/18 0537  BP:  (!) 121/57 (!) 162/75 (!) 162/61  Pulse:  84 76 75  Resp:  18 18 18   Temp:  99.9 F (37.7 C) 98.2 F (36.8 C) 98.4 F (36.9 C)  TempSrc:  Oral Oral Oral  SpO2:  97% 99% 100%  Weight: (!) 156 kg (343 lb 14.7 oz)     Height:        NAD ABD soft Sensation intact distally Intact pulses distally Dorsiflexion/Plantar flexion intact Incision: scant drainage Dressing c/d/i   Lab Results  Component Value Date   WBC 13.2 (H) 02/19/2018   HGB 9.8 (L) 02/19/2018   HCT 30.3 (L) 02/19/2018   MCV 96.8 02/19/2018   PLT 161 02/19/2018   BMET    Component Value Date/Time   NA 142 02/21/2018 0408   K 5.3 (H) 02/21/2018 0408   CL 109 02/21/2018 0408   CO2 28 02/21/2018 0408   GLUCOSE 81 02/21/2018 0408   BUN 33 (H) 02/21/2018 0408   CREATININE 1.32 (H) 02/21/2018 0408   CALCIUM 8.0 (L) 02/21/2018 0408   GFRNONAA 53 (L) 02/21/2018 0408   GFRAA >60 02/21/2018 0408     Assessment/Plan: 4 Days Post-Op   Principal Problem:   Femur fracture, left (HCC) Active Problems:   Hypertension   Anemia   Femur fracture (HCC)   WBAT with walker DVT ppx: xarelto, SCDs, TEDS PO pain control PT/OT Dispo: D/C planning   Nicholes Stairs 02/21/2018, 8:31 AM

## 2018-02-22 DIAGNOSIS — D62 Acute posthemorrhagic anemia: Secondary | ICD-10-CM

## 2018-02-22 DIAGNOSIS — Z96642 Presence of left artificial hip joint: Secondary | ICD-10-CM

## 2018-02-22 LAB — BASIC METABOLIC PANEL
Anion gap: 5 (ref 5–15)
BUN: 32 mg/dL — ABNORMAL HIGH (ref 6–20)
CO2: 29 mmol/L (ref 22–32)
Calcium: 8.2 mg/dL — ABNORMAL LOW (ref 8.9–10.3)
Chloride: 109 mmol/L (ref 101–111)
Creatinine, Ser: 1.33 mg/dL — ABNORMAL HIGH (ref 0.61–1.24)
GFR calc Af Amer: >60 mL/min (ref >60)
GFR calc non Af Amer: 52 mL/min — ABNORMAL LOW (ref >60)
Glucose, Bld: 96 mg/dL (ref 65–99)
Potassium: 4.6 mmol/L (ref 3.5–5.1)
Sodium: 143 mmol/L (ref 135–145)

## 2018-02-22 LAB — HEMOGLOBIN AND HEMATOCRIT, BLOOD
HCT: 27.4 % — ABNORMAL LOW (ref 39.0–52.0)
Hemoglobin: 8.8 g/dL — ABNORMAL LOW (ref 13.0–17.0)

## 2018-02-22 MED ORDER — RIVAROXABAN 10 MG PO TABS: 10.0000 mg | ORAL_TABLET | Freq: Every day | ORAL | 0 refills | Status: DC

## 2018-02-22 MED ORDER — ALUM & MAG HYDROXIDE-SIMETH 200-200-20 MG/5ML PO SUSP
30.0000 mL | ORAL | Status: DC | PRN
  Administered 2018-02-22 – 2018-02-23 (×4): 30 mL via ORAL
  Filled 2018-02-22 (×4): qty 30

## 2018-02-22 NOTE — Progress Notes (Signed)
Pt checked frequently, rested comfortably at long intervals with eyes closed, resp even and unlabored.CPAP intact. Medicated X 2 with oral Tylenol for c/o left femur/hip pain. Dressing remains C/D/I at this time. No acute distress noted. Uneventful night.

## 2018-02-22 NOTE — Care Management Important Message (Signed)
Important Message  Patient Details  Name: CEM KOSMAN MRN: [id number] Date of Birth: Dec 30, 1945   Medicare Important Message Given:  Yes    Kerin Salen 02/22/2018, 11:38 AMImportant Message  Patient Details  Name: AKIEL FENNELL MRN: [id number] Date of Birth: 1945/12/23   Medicare Important Message Given:  Yes    Kerin Salen 02/22/2018, 11:38 AM

## 2018-02-22 NOTE — Progress Notes (Signed)
PROGRESS NOTE    PERICLES CARMICHEAL  ZMO:[id number] DOB: 1945-09-21 DOA: 02/15/2018 PCP: [name], MD    Brief Narrative:  72 year old male who presented with hip pain, he does have significant past medical history for hypertension, temporal arteritis and chronic left hip pain. Patient reported worsening left hip pain, imaging showed disease around the distal portion of the femoral component of the left hip, suggesting loosening arthroplasty. Initial physical examination blood pressure 130/68, heart rate 90, temperature 98.2, respiratory rate 18. Oxygen saturation 97%. Moist mucous membranes, lungs clear to auscultation bilaterally, heart S1-S2 present rhythmic, abdomen soft nontender, no lower extremity edema.Sodium 142, potassium 4.4, chloride 108, bicarb 29, glucose 87, BUN 27, creatinine 1.15, white count 9.7, hemoglobin 9.6 hematocrit 37.6, platelets 156.Urinalysis negative for infection,specific gravity 1.008left hip radiograph with lucency surrounding the distal portion of the femoral component of the left hip arthroplasty digesting loosening,development of mildly displaced cortical fracture overlying the distal tip of the prosthesis in the proximal left femoral shaft.Chest radiograph with left rotation, increased lung markings, no infiltrates. EKG sinus rhythm with premature atrial complexes.  Patient was admitted to the hospitalwith theworking diagnosis of left femur fracture.     Assessment & Plan:   Principal Problem:   Femur fracture, left (HCC) Active Problems:   Hypertension   Anemia   Femur fracture ([place])   1. Left femur fracturein the setting of obesity BMI 49 sp left total hip arthroplasty revision/ sp NEW local bleed and acute blood loss anemia . Continue pain control with hydromorphone  1 mg as needed q 4 hours and  Flexeril. Scheduled methadoneand methacarbamol.  On rivaroxaban for anticoagulation, last night developed bleeding at the site of the  wound, his hb has doped one gram and has developed orthostatic hypotension. Will continue local wound care, and H&H monitoring.   2. New pre-renal AKI with Hyperkalemia.Renal function with stable serum cr at 1,33 with K at 4,6  and serum bicarbonate at 29. Continue to hold on diuretics and IV fluids for now, avoid hypotension, worsening anemia or nephrotoxic medications.  2. Temporal arteritis.Continueprednisone with good toleration  3. HTN.Positive orthostatic hypotension today, continue to hold on BP meds.   4, GERD. Continue maalox, and famotidine.   5. Depression.Onamitriptyline and venlafaxine.   6. Morbid obesity and OSA. BMI 49.  c pap at night.   DVT prophylaxis:rivaroxaban. Code Status:full Family Communication:no family at the bedside Disposition Plan:pending placement at SNF   Consultants:  Orthopedics  Procedures:    Antimicrobials:   Subjective: Patient had a large episode of bleeding on his surgical site last night, that currently has stopped. Pain seems to be worse today, no nausea or vomiting.   Objective: Vitals:   02/21/18 0537 02/21/18 1310 02/21/18 2133 02/22/18 0536  BP: (!) 162/61 (!) 149/65 (!) 154/70 (!) 148/63  Pulse: 75 86 95 72  Resp: 18 16 18 18   Temp: 98.4 F (36.9 C) 98.3 F (36.8 C) (!) 100.4 F (38 C) 97.6 F (36.4 C)  TempSrc: Oral Oral Oral Oral  SpO2: 100% 99% 97% 98%  Weight:      Height:        Intake/Output Summary (Last 24 hours) at 02/22/2018 1147 Last data filed at 02/22/2018 0555 Gross per 24 hour  Intake 720 ml  Output 8675 ml  Net -7955 ml   Filed Weights   02/17/18 1522 02/18/18 0419 02/20/18 0611  Weight: (!) 151.5 kg (333 lb 15.9 oz) (!) 148.9 kg (328 lb 4.2 oz) Marland Kitchen)  156 kg (343 lb 14.7 oz)    Examination:   General: deconditioned  Neurology: Awake and alert, non focal  E ENT: mild pallor, no icterus, oral mucosa moist Cardiovascular: No JVD. S1-S2 present, rhythmic, no  gallops, rubs, or murmurs. No lower extremity edema. Pulmonary: decreased breath sounds bilaterally, adequate air movement, no wheezing, rhonchi or rales. Gastrointestinal. Abdomen protuberant no organomegaly, non tender, no rebound or guarding Skin. Large ecchymosis at the left hip, tender to palpation.  Musculoskeletal: no joint deformities     Data Reviewed: I have personally reviewed following labs and imaging studies  CBC: Recent Labs  Lab 02/15/18 1249 02/16/18 0910 02/17/18 0445 02/18/18 0447 02/19/18 0430 02/22/18 0446  WBC 9.7 8.1 9.6 13.4* 13.2*  --   NEUTROABS  --  4.6 6.6 11.1* 9.9*  --   HGB 11.6* 11.6* 11.8* 9.8* 9.8* 8.8*  HCT 37.6* 37.2* 37.3* 31.0* 30.3* 27.4*  MCV 96.9 98.7 96.1 97.8 96.8  --   PLT 156 153 178 147* 161  --    Basic Metabolic Panel: Recent Labs  Lab 02/18/18 0447 02/19/18 0430 02/20/18 0426 02/21/18 0408 02/22/18 0446  NA 140 138 139 142 143  K 5.3* 4.6 4.7 5.3* 4.6  CL 104 104 107 109 109  CO2 30 27 25 28 29   GLUCOSE 168* 137* 85 81 96  BUN 29* 39* 37* 33* 32*  CREATININE 1.49* 1.94* 1.53* 1.32* 1.33*  CALCIUM 8.2* 8.1* 8.0* 8.0* 8.2*   GFR: Estimated Creatinine Clearance: 74.5 mL/min (A) (by C-G formula based on SCr of 1.33 mg/dL (H)). Liver Function Tests: No results for input(s): AST, ALT, ALKPHOS, BILITOT, PROT, ALBUMIN in the last 168 hours. No results for input(s): LIPASE, AMYLASE in the last 168 hours. No results for input(s): AMMONIA in the last 168 hours. Coagulation Profile: Recent Labs  Lab 02/15/18 1249  INR 1.06   Cardiac Enzymes: No results for input(s): CKTOTAL, CKMB, CKMBINDEX, TROPONINI in the last 168 hours. BNP (last 3 results) No results for input(s): PROBNP in the last 8760 hours. HbA1C: No results for input(s): HGBA1C in the last 72 hours. CBG: No results for input(s): GLUCAP in the last 168 hours. Lipid Profile: No results for input(s): CHOL, HDL, LDLCALC, TRIG, CHOLHDL, LDLDIRECT in the last 72  hours. Thyroid Function Tests: No results for input(s): TSH, T4TOTAL, FREET4, T3FREE, THYROIDAB in the last 72 hours. Anemia Panel: No results for input(s): VITAMINB12, FOLATE, FERRITIN, TIBC, IRON, RETICCTPCT in the last 72 hours.    Radiology Studies: I have reviewed all of the imaging during this hospital visit personally     Scheduled Meds: . amitriptyline  25 mg Oral QHS  . cycloSPORINE  1 drop Both Eyes BID  . darifenacin  7.5 mg Oral Daily  . docusate sodium  100 mg Oral BID  . famotidine  20 mg Oral QHS  . gabapentin  600 mg Oral Daily   And  . gabapentin  900 mg Oral QHS  . methadone  10 mg Oral QID  . methocarbamol  750 mg Oral QID  . multivitamin with minerals  1 tablet Oral QPM  . polyethylene glycol  17 g Oral Daily  . predniSONE  25 mg Oral Q breakfast  . rivaroxaban  10 mg Oral Q breakfast  . senna-docusate  1 tablet Oral BID  . venlafaxine XR  300 mg Oral Q breakfast   Continuous Infusions:   LOS: 7 days        Jahrell Hamor Gerome Apley, MD  [facility] Pager [phone]

## 2018-02-22 NOTE — Progress Notes (Signed)
Paged Hilton Cork., PA to make her aware of patients positive orthostatic vitals, over night shift patient had a large amount of bloody drainage on dressing,what appears to be a hematoma to surgical site and that attending MD ordered H&H.

## 2018-02-22 NOTE — Progress Notes (Addendum)
Occupational Therapy Treatment Patient Details Name: [name] MRN: [id number] DOB: 1946/07/05 Today's Date: 02/22/2018    History of present illness 72 yo male admitted with L periprosthetic femur fx. S/P L THA rev-posterior 6/19. Hx of L THA, R THA + rev 2013, morbid obesity   OT comments  Pt able to sidestep up [place].  Pt orthostatic today.  Needs a lot of assistance to get back into bed  Follow Up Recommendations  SNF    Equipment Recommendations  None recommended by OT    Recommendations for Other Services      Precautions / Restrictions Precautions Precautions: Posterior Hip;Fall Precaution Comments: check BP  ORTHOSTATIC Restrictions Weight Bearing Restrictions: No LLE Weight Bearing: Weight bearing as tolerated       Mobility Bed Mobility Overal bed mobility: Needs Assistance Bed Mobility: Supine to Sit;Sit to Supine     Supine to sit: Mod assist;+2 for physical assistance;+2 for safety/equipment Sit to supine: Max assist;+2 for safety/equipment;+2 for physical assistance   General bed mobility comments: Assist for trunk and bil LEs. Utilized bedpad to aid with scooting/positioning. Pt relies heavily on bedrails/trapeze. Max multimodal cueing required for safety, technique, and adherence to precautions  Transfers Overall transfer level: Needs assistance Equipment used: Rolling walker (2 wheeled) Transfers: Sit to/from Stand Sit to Stand: Mod assist;+2 physical assistance;+2 safety/equipment;From elevated surface         General transfer comment: x 2 for strengthening, activity tolerance. Assist to rise, stabilize, control descent. VCs safety, technique, hand/LE palcement. Pt c/o dizziness after 1st standing trial so check BP during standing on 2nd standing trial. BP dropped to 87/50. Assisted pt back into sitting.     Balance Overall balance assessment: Needs assistance         Standing balance support: Bilateral upper extremity supported Standing  balance-[name]: Poor                             ADL either performed or assessed with clinical judgement   ADL                                         General ADL Comments: donned long gown in bed; min A to pull down in back     Vision       Perception     Praxis      Cognition Arousal/Alertness: Awake/alert Behavior During Therapy: WFL for tasks assessed/performed Overall Cognitive Status: Within Functional Limits for tasks assessed, mostly      Pt unable to name precautions but when given choices, he identifies correct one.  Needs cues during functional activities                           General Comments: talkative; needs cues and at times resistant to therapist's instruction        Exercises     Shoulder Instructions       General Comments pt was orthostatic; 140/90 sitting and 87/50 sitting    Pertinent Vitals/ Pain       Pain Assessment: Faces Faces Pain Scale: Hurts whole lot Pain Location: L hip with activity Pain Descriptors / Indicators: Sharp;Discomfort;Grimacing;Aching Pain Intervention(s): Limited activity within patient's tolerance;Monitored during session;Premedicated before session;Repositioned  Home Living  Prior Functioning/Environment              Frequency  Min 2X/week        Progress Toward Goals  OT Goals(current goals can now be found in the care plan section)        Plan      Co-evaluation    PT/OT/SLP Co-Evaluation/Treatment: Yes Reason for Co-Treatment: For patient/therapist safety PT goals addressed during session: Mobility/safety with mobility OT goals addressed during session: ADL's and self-care      AM-PAC PT "6 Clicks" Daily Activity     Outcome Measure   Help from another person eating meals?: None Help from another person taking care of personal grooming?: A Little Help from another person  toileting, which includes using toliet, bedpan, or urinal?: A Lot Help from another person bathing (including washing, rinsing, drying)?: A Lot Help from another person to put on and taking off regular upper body clothing?: A Little Help from another person to put on and taking off regular lower body clothing?: A Lot 6 Click Score: 16    End of Session    OT Visit Diagnosis: Unsteadiness on feet (R26.81);Muscle weakness (generalized) (M62.81);Pain Pain - Right/Left: Left Pain - part of body: Hip   Activity Tolerance Patient limited by fatigue;Patient limited by pain   Patient Left in bed;with bed alarm set;with call bell/phone within reach   Nurse Communication Mobility status        Time: 1007-1050 OT Time Calculation (min): 43 min  Charges: OT General Charges $OT Visit: 1 Visit OT Treatments $Therapeutic Activity: 8-22 mins  [name], OTR/L [phone] 02/22/2018   [name] 02/22/2018, 11:50 AM

## 2018-02-22 NOTE — Progress Notes (Signed)
  02/22/2018 5 Days Post-Op Procedure(s) (LRB): Left total hip arthroplasty revision (Left) Patient reports pain as mild.   Patient seen in rounds for Dr. Wynelle Link. Patient is well, and has had no acute complaints or problems other than pain in the left hip. Voiding without difficulty and positive flatus. Denies chest pain, SOB or calf pain. They will be WBAT to the left.  Vital signs in last 24 hours: Temp:  [97.6 F (36.4 C)-100.4 F (38 C)] 97.6 F (36.4 C) (06/24 0536) Pulse Rate:  [72-95] 72 (06/24 0536) Resp:  [16-18] 18 (06/24 0536) BP: (148-154)/(63-70) 148/63 (06/24 0536) SpO2:  [97 %-99 %] 98 % (06/24 0536)  I&O's: I/O last 3 completed shifts: In: 960 [P.O.:960] Out: 9425 [Urine:9425] No intake/output data recorded.  Labs: No results for input(s): HGB in the last 72 hours. No results for input(s): WBC, RBC, HCT, PLT in the last 72 hours. Recent Labs    02/21/18 0408 02/22/18 0446  NA 142 143  K 5.3* 4.6  CL 109 109  CO2 28 29  BUN 33* 32*  CREATININE 1.32* 1.33*  GLUCOSE 81 96  CALCIUM 8.0* 8.2*   No results for input(s): LABPT, INR in the last 72 hours.  Exam: General - Patient is Alert and Oriented Extremity - Neurologically intact Neurovascular intact Sensation intact distally Dorsiflexion/Plantar flexion intact Dressing - dressing C/D/I Motor Function - intact, moving foot and toes well on exam.   Past Medical History:  Diagnosis Date  . Anginal pain ([place]) 2006   evaluated by cardio  . Arthritis    knees,feet,shoulders,elbows.hands  . Constipation   . Dyspnea    with exertion   . Dysrhythmia    Atrial Flutter- 2006- corredted itself  . Family history of adverse reaction to anesthesia    mother- with novocaine went into shock  . GERD (gastroesophageal reflux disease)   . Headache   . Hemorrhoids   . History of blood transfusion   . Hypertension   . Insomnia   . Sleep apnea    cpap  . 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   Weight as of this encounter: 156 kg (343 lb 14.7 oz). Up with therapy  DVT Prophylaxis - Xarelto Weight-bearing as tolerated  Plan for discharge to SNF per medicine. Follow-up in office with Dr. Wynelle Link in 2 weeks.   Frank Duty, PA-C Orthopedic Surgery 02/22/2018, 7:10 AM

## 2018-02-22 NOTE — Progress Notes (Signed)
Physical Therapy Treatment Patient Details Name: [name] MRN: [id number] DOB: [birth year] Today's Date: 02/22/2018    History of Present Illness 72 yo male admitted with L periprosthetic femur fx. S/P L THA rev-posterior 6/19. Hx of L THA, R THA + rev 2013, morbid obesity    PT Comments    RN reported pt has been having more drainage from L hip. She was okay with PT reinforcing dressing with ABD pad prior to mobilizing. Pt continues to require +2 assist for safe mobility. He stood x 2 on today and attempted lateral steps along bedside with RW. Mobility progress will likely be on slower side due to pt size and limited mobility prior to admission/surgery. Assessed BP in standing due to pt's c/o dizziness-BP 87/50. Assisted pt back into bed. Noted drainage beginning to show through ABD once back in supine. RN aware. Continue to recommend SNF.     Follow Up Recommendations  SNF     Equipment Recommendations  None recommended by PT    Recommendations for Other Services       Precautions / Restrictions Precautions Precautions: Posterior Hip;Fall Precaution Comments: Reviewed posterior precautions. Dizziness-?orthostatic Restrictions Weight Bearing Restrictions: No LLE Weight Bearing: Weight bearing as tolerated    Mobility  Bed Mobility Overal bed mobility: Needs Assistance Bed Mobility: Supine to Sit;Sit to Supine     Supine to sit: Mod assist;+2 for physical assistance;+2 for safety/equipment Sit to supine: Max assist;+2 for safety/equipment;+2 for physical assistance   General bed mobility comments: Assist for trunk and bil LEs. Utilized bedpad to aid with scooting/positioning. Pt relies heavily on bedrails/trapeze. Max multimodal cueing required for safety, technique, and adherence to precautions  Transfers Overall transfer level: Needs assistance Equipment used: Rolling walker (2 wheeled) Transfers: Sit to/from Stand Sit to Stand: Mod assist;+2 physical assistance;+2  safety/equipment;From elevated surface         General transfer comment: x 2 for strengthening, activity tolerance. Assist to rise, stabilize, control descent. VCs safety, technique, hand/LE palcement. Pt c/o dizziness after 1st standing trial so check BP during standing on 2nd standing trial. BP dropped to 87/50. Assisted pt back into sitting.   Ambulation/Gait             General Gait Details: Attempted side steps along side of bed with a RW. Cues for pt to try to pick up foot and take steps instead of heel-toe pivot. Pt was unable so he continued on with pivot technique.    Stairs             Wheelchair Mobility    Modified Rankin (Stroke Patients Only)       Balance Overall balance assessment: Needs assistance         Standing balance support: Bilateral upper extremity supported Standing balance-[name]: Poor                              Cognition Arousal/Alertness: Awake/alert Behavior During Therapy: WFL for tasks assessed/performed Overall Cognitive Status: Within Functional Limits for tasks assessed                                 General Comments: talkative; needs cues and at times resistant to therapist's instruction      Exercises      General Comments        Pertinent Vitals/Pain Pain Assessment: Faces Faces Pain Scale:  Hurts whole lot Pain Location: L hip with activity Pain Descriptors / Indicators: Sharp;Discomfort;Grimacing;Aching Pain Intervention(s): Limited activity within patient's tolerance;Monitored during session;Repositioned    Home Living                      Prior Function            PT Goals (current goals can now be found in the care plan section) Progress towards PT goals: Progressing toward goals    Frequency    Min 3X/week      PT Plan Current plan remains appropriate    Co-evaluation   Reason for Co-Treatment: For patient/therapist safety PT goals addressed  during session: Mobility/safety with mobility        AM-PAC PT "6 Clicks" Daily Activity  Outcome Measure  Difficulty turning over in bed (including adjusting bedclothes, sheets and blankets)?: Unable Difficulty moving from lying on back to sitting on the side of the bed? : Unable Difficulty sitting down on and standing up from a chair with arms (e.g., wheelchair, bedside commode, etc,.)?: Unable Help needed moving to and from a bed to chair (including a wheelchair)?: A Lot Help needed walking in hospital room?: Total Help needed climbing 3-5 steps with a railing? : Total 6 Click Score: 7    End of Session Equipment Utilized During Treatment: Gait belt Activity Tolerance: Patient limited by fatigue;Patient limited by pain Patient left: in bed;with call bell/phone within reach   PT Visit Diagnosis: Muscle weakness (generalized) (M62.81);Difficulty in walking, not elsewhere classified (R26.2);Pain;History of falling (Z91.81) Pain - Right/Left: Left Pain - part of body: Hip     Time: 5374-8270 PT Time Calculation (min) (ACUTE ONLY): 46 min  Charges:  $Therapeutic Activity: 23-37 mins                    G Codes:          Weston Anna, MPT Pager: [phone]

## 2018-02-22 NOTE — Progress Notes (Addendum)
Reported to Probation officer from night RN that patient had a copious amount of serosanguinous drainage from hip dressing. Dressing was changed. Upon assessment this am, patient has a large hematoma on left hip/thigh region and dressing had some old/new drainage present.  Patient's MD made aware of this.

## 2018-02-22 NOTE — Progress Notes (Signed)
   02/22/18 0935  Orthostatic Sitting  BP- Sitting 140/90  Pulse- Sitting 101  Orthostatic Standing at 0 minutes  BP- Standing at 0 minutes (!) 87/50  Pulse- Standing at 0 minutes 85  Pain Assessment  Pain Scale 0-10  Pain Score 8  Pain Type Surgical pain  Pain Location Hip  Pain Orientation Left  Pain Descriptors / Indicators Aching  Pain Onset On-going  Patients Stated Pain Goal 3  Pain Intervention(s) Medication (See eMAR)   PT alerted RN that patient was dizzy while standing and patient's BP did drop. MD made aware. New orders received for H&H. Will continue to monitor.

## 2018-02-23 DIAGNOSIS — D649 Anemia, unspecified: Secondary | ICD-10-CM

## 2018-02-23 LAB — CBC WITH DIFFERENTIAL/PLATELET
Basophils Absolute: 0 10*3/uL (ref 0.0–0.1)
Basophils Relative: 0 %
Eosinophils Absolute: 0.1 10*3/uL (ref 0.0–0.7)
Eosinophils Relative: 1 %
HCT: 26.8 % — ABNORMAL LOW (ref 39.0–52.0)
Hemoglobin: 8.3 g/dL — ABNORMAL LOW (ref 13.0–17.0)
Lymphocytes Relative: 26 %
Lymphs Abs: 1.9 10*3/uL (ref 0.7–4.0)
MCH: 30.4 pg (ref 26.0–34.0)
MCHC: 31 g/dL (ref 30.0–36.0)
MCV: 98.2 fL (ref 78.0–100.0)
Monocytes Absolute: 0.9 10*3/uL (ref 0.1–1.0)
Monocytes Relative: 12 %
Neutro Abs: 4.5 10*3/uL (ref 1.7–7.7)
Neutrophils Relative %: 61 %
Platelets: 196 10*3/uL (ref 150–400)
RBC: 2.73 MIL/uL — ABNORMAL LOW (ref 4.22–5.81)
RDW: 17.9 % — ABNORMAL HIGH (ref 11.5–15.5)
WBC: 7.4 10*3/uL (ref 4.0–10.5)

## 2018-02-23 LAB — BASIC METABOLIC PANEL
Anion gap: 4 — ABNORMAL LOW (ref 5–15)
BUN: 29 mg/dL — ABNORMAL HIGH (ref 8–23)
CO2: 30 mmol/L (ref 22–32)
Calcium: 8 mg/dL — ABNORMAL LOW (ref 8.9–10.3)
Chloride: 107 mmol/L (ref 98–111)
Creatinine, Ser: 1.3 mg/dL — ABNORMAL HIGH (ref 0.61–1.24)
GFR calc Af Amer: >60 mL/min (ref >60)
GFR calc non Af Amer: 54 mL/min — ABNORMAL LOW (ref >60)
Glucose, Bld: 93 mg/dL (ref 70–99)
Potassium: 4.4 mmol/L (ref 3.5–5.1)
Sodium: 141 mmol/L (ref 135–145)

## 2018-02-23 LAB — HEMOGLOBIN AND HEMATOCRIT, BLOOD
HCT: 28.2 % — ABNORMAL LOW (ref 39.0–52.0)
Hemoglobin: 8.9 g/dL — ABNORMAL LOW (ref 13.0–17.0)

## 2018-02-23 MED ORDER — PREDNISONE 20 MG PO TABS: 20.0000 mg | ORAL_TABLET | Freq: Two times a day (BID) | ORAL | 0 refills | Status: DC

## 2018-02-23 MED ORDER — RIVAROXABAN 10 MG PO TABS: 10.0000 mg | ORAL_TABLET | Freq: Every day | ORAL | 0 refills | Status: DC

## 2018-02-23 MED ORDER — FAMOTIDINE 20 MG PO TABS: 20.0000 mg | ORAL_TABLET | Freq: Every day | ORAL | 0 refills | Status: DC

## 2018-02-23 MED ORDER — POLYETHYLENE GLYCOL 3350 17 G PO PACK: 17.0000 g | PACK | Freq: Every day | ORAL | 0 refills | Status: DC | PRN

## 2018-02-23 MED ORDER — FUROSEMIDE 20 MG PO TABS: 20.0000 mg | ORAL_TABLET | Freq: Every day | ORAL | 0 refills | Status: DC | PRN

## 2018-02-23 NOTE — Progress Notes (Signed)
  02/23/2018 6 Days Post-Op Procedure(s) (LRB): Left total hip arthroplasty revision (Left) Patient reports pain as moderate.   Patient seen in rounds with Dr. Wynelle Link. Patient is well, but has had some minor complaints of bloody drainage from incision site. Denies chest pain or SOB. Voiding without difficulty.  They are WBAT to the left. We will continue working with therapy.  Vital signs in last 24 hours: Temp:  [98.5 F (36.9 C)-99.5 F (37.5 C)] 98.5 F (36.9 C) (06/25 0527) Pulse Rate:  [68-86] 68 (06/25 0527) Resp:  [16-20] 20 (06/25 0527) BP: (134-168)/(62-70) 168/62 (06/25 0527) SpO2:  [97 %-99 %] 99 % (06/25 0527)  I&O's: I/O last 3 completed shifts: In: -  Out: 750 [Urine:750] Total I/O In: -  Out: 200 [Urine:200]  Labs: Recent Labs    02/22/18 0446 02/23/18 0451  HGB 8.8* 8.3*   Recent Labs    02/22/18 0446 02/23/18 0451  WBC  --  7.4  RBC  --  2.73*  HCT 27.4* 26.8*  PLT  --  196   Recent Labs    02/22/18 0446 02/23/18 0451  NA 143 141  K 4.6 4.4  CL 109 107  CO2 29 30  BUN 32* 29*  CREATININE 1.33* 1.30*  GLUCOSE 96 93  CALCIUM 8.2* 8.0*   Exam: General - Patient is Alert and Oriented Extremity - Neurologically intact Neurovascular intact Sensation intact distally Dorsiflexion/Plantar flexion intact Dressing - dressing C/D/I. No drainage seen this AM. Motor Function - intact, moving foot and toes well on exam.   Past Medical History:  Diagnosis Date  . Anginal pain ([place]) 2006   evaluated by cardio  . Arthritis    knees,feet,shoulders,elbows.hands  . Constipation   . Dyspnea    with exertion   . Dysrhythmia    Atrial Flutter- 2006- corredted itself  . Family history of adverse reaction to anesthesia    mother- with novocaine went into shock  . GERD (gastroesophageal reflux disease)   . Headache   . Hemorrhoids   . History of blood transfusion   . Hypertension   . Insomnia   . Sleep apnea    cpap  . Temporal giant cell  arteritis ([place]) 12/30/2017    Assessment/Plan: 6 Days Post-Op Procedure(s) (LRB): Left total hip arthroplasty revision (Left) Principal Problem:   Femur fracture, left (HCC) Active Problems:   Hypertension   Anemia   Femur fracture (HCC)  Estimated body mass index is 52.29 kg/m as calculated from the following:   Height as of this encounter: 5\' 8"  (1.727 m).   Weight as of this encounter: 156 kg (343 lb 14.7 oz). Up with therapy  DVT Prophylaxis - Xarelto Weight-bearing as tolerated  Pt has had complaints of bloody drainage coming from incision, hemoglobin 8.3 this AM. Will continue to monitor.  Disposition per medicine.  Theresa Duty, PA-C Orthopedic Surgery 02/23/2018, 8:23 AM

## 2018-02-23 NOTE — Progress Notes (Signed)
Report called to [place] at [facility]. Stacey Drain

## 2018-02-23 NOTE — Discharge Summary (Addendum)
Physician Discharge Summary  [name]:[id number] DOB: [birth year] DOA: 02/15/2018  PCP: [name], MD  Admit date: 02/15/2018 Discharge date: 02/23/2018  Admitted From: Home  Disposition:  SNF  Recommendations for Outpatient Follow-up and new medication changes:  1. Follow up with Dr. Edrick Oh in 7 days.  2. Please follow H&H in 72 hours 3. Please follow on left hip ecchymosis 4. Follow up with Dr Wynelle Link in 2 weeks 5. Weightbearing as tolerated. 6. Prednisone decreased to 20 mg po bid, continue to taper per outpatient clinic instructions.   Home Health: na  Equipment/Devices: na   Discharge Condition: stable  CODE STATUS: full  Diet recommendation: heart healthy   Brief/Interim Summary: 72 year old male who presented with hip pain, he does have the significant past medical history for hypertension, temporal arteritis and chronic left hip pain. Patient reported worsening left hip pain, imaging showed disease around the distal portion of the femoral component of the left hip, suggesting loosening arthroplasty. Initial physical examination blood pressure 130/68, heart rate 90, temperature 98.2, respiratory rate 18. Oxygen saturation 97%. Moist mucous membranes, lungs clear to auscultation bilaterally, heart S1-S2 present rhythmic, abdomen soft nontender, no lower extremity edema.Sodium 142, potassium 4.4, chloride 108, bicarb 29, glucose 87, BUN 27, creatinine 1.15, white count 9.7, hemoglobin 9.6 hematocrit 37.6, platelets 156.Urinalysis negative for infection,specific gravity 1.008left hip radiograph with lucency surrounding the distal portion of the femoral component of the left hip arthroplasty suggesting loosening,development of mildly displaced cortical fracture overlying the distal tip of the prosthesis in the proximal left femoral shaft.Chest radiograph with left rotation, increased lung markings, no infiltrates. EKG sinus rhythm with premature atrial  complexes.  Patient was admitted to the hospitalwith theworking diagnosis of left femur fracture.  1. Left femur fracturein the setting of obesity BMI 49 sp left total hip arthroplasty revision.  Patient was admitted to the medical ward, he received IV analgesics as needed and muscle relaxants.  DVT prophylaxis and orthopedic evaluation.  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  Patient was seen by physical therapy postoperatively with recommendations to discharge to skilled nursing facility.  Patient was placed on rivaroxaban for DVT prophylaxis.  Follow-up in the orthopedic clinic with Dr. Wynelle Link in 2 weeks.  Weightbearing as tolerated.  2.  Acute postoperative anemia due to acute blood loss.  Patient experienced bleeding per his surgical wound, it was locally controlled with a compression bandage, he lost about 1 g of hemoglobin, his discharge hemoglobin is 8.9, no indication for PRBC transfusion.  No further signs of bleeding.  Continue DVT prophylaxis.  Close follow-up on ecchymotic region at his left hip.  Follow-up H&H in 3 days.  3.  Prerenal acute kidney injury with hyperkalemia.  Patient received isotonic IV fluids, diuretics and nephrotoxic agents were held, kidney function improved with discharge creatinine down to 1.3 from a peak of 1.9.  His discharge potassium is 4.4 and serum bicarbonate 30.  At discharge patient will resume his diuretic therapy only as needed.   4.  Temporal arteritis.  No signs of exacerbation, continue prednisone therapy, it has been reduced to 20 mg po bid, and continue to taper per outpatient clinic instructions.  5.  GERD.  To continue antiacid therapy with famotidine.  6.  Depression.  Amitriptyline and venlafaxine.  7.  Morbid obesity with obstructive sleep apnea.  Continue CPAP at night, BMI 49, continue physical therapy, aggressive  lifestyle modification.   Discharge Diagnoses:  Principal Problem:   Femur fracture, left ([place]) Active Problems:   Hypertension   Anemia   Femur fracture ([place])    Discharge Instructions   Allergies as of 02/23/2018      Reactions   Bee Venom Swelling      Medication List    STOP taking these medications   celecoxib 200 MG capsule Commonly known as:  CELEBREX     TAKE these medications   amitriptyline 25 MG tablet Commonly known as:  ELAVIL Take 25 mg by mouth at bedtime.   amLODipine 10 MG tablet Commonly known as:  NORVASC Take 10 mg by mouth daily.   aspirin EC 81 MG tablet Take 81 mg by mouth every evening.   CENTRUM ADULTS PO Take 1 tablet by mouth every evening.   cycloSPORINE 0.05 % ophthalmic emulsion Commonly known as:  RESTASIS Place 1 drop into both eyes 2 (two) times daily.   diphenhydrAMINE 25 MG tablet Commonly known as:  BENADRYL Take 25-50 mg by mouth every 8 (eight) hours as needed for allergies (bee stings).   famotidine 20 MG tablet Commonly known as:  PEPCID Take 1 tablet (20 mg total) by mouth at bedtime.   furosemide 20 MG tablet Commonly known as:  LASIX Take 1 tablet (20 mg total) by mouth daily as needed for fluid or edema (as needed for edema.). What changed:    when to take this  reasons to take this   gabapentin 300 MG capsule Commonly known as:  NEURONTIN Take 600-900 mg by mouth See admin instructions. Take 2 capsules in the morning & 3 capsules at bedtim   methadone 10 MG tablet Commonly known as:  DOLOPHINE Take 10 mg by mouth 4 (four) times daily.   methocarbamol 750 MG tablet Commonly known as:  ROBAXIN Take 750 mg by mouth 4 (four) times daily.   omeprazole 40 MG capsule Commonly known as:  PRILOSEC Take 40 mg by mouth daily.   polyethylene glycol packet Commonly known as:  MIRALAX / GLYCOLAX Take 17 g by mouth daily as needed for mild constipation.   predniSONE 20 MG tablet Commonly known as:   DELTASONE Take 1 tablet (20 mg total) by mouth 2 (two) times daily with a meal. What changed:  when to take this   PRESCRIPTION MEDICATION Apply 1 application topically at bedtime. Testosterone Cream 10%.  Apply 1 gm daily as directed.   rivaroxaban 10 MG Tabs tablet Commonly known as:  XARELTO Take 1 tablet (10 mg total) by mouth daily with breakfast for 15 days. Take one 10 mg tablet Xarelto once a day AND one 81 mg Aspirin once a day for three weeks following surgery to prevent blood clots.  Then discontinue Xarelto and resume one 81 mg Aspirin once a day.   solifenacin 10 MG tablet Commonly known as:  VESICARE Take 10 mg by mouth daily.   venlafaxine XR 150 MG 24 hr capsule Commonly known as:  EFFEXOR-XR Take 300 mg by mouth daily with breakfast.   Vitamin D3 2000 units Tabs Take 2,000 Units by mouth every evening.       Contact information for follow-up providers    Gaynelle Arabian, MD Follow up in 2 week(s).   Specialty:  Orthopedic Surgery Contact information: [address] [place] [address] [phone]            Contact information for after-discharge care    Destination  [facility] .   Service:  Skilled Nursing Contact information: [place] [phone]                 Allergies  Allergen Reactions  . Bee Venom Swelling    Consultations:  Orthopedics   Procedures/Studies: Dg Chest 1 View  Result Date: 02/15/2018 CLINICAL DATA:  Preop hip fracture EXAM: CHEST  1 VIEW COMPARISON:  03/11/2012 FINDINGS: AP portable semi upright view of the chest. Mild cardiac enlargement with ectasia of the thoracic aorta. Mild aortic atherosclerosis is noted. Subpleural scarring and/or atelectasis is noted along the periphery of the left mid lung with pleural thickening. No acute fracture or pulmonary consolidation. Slight elevation along the lateral aspect of the left hemidiaphragm  volume loss. 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 IMPRESSION: 1. No acute pulmonary consolidation. Subpleural scarring or atelectasis in the left mid lung. 2. Cardiac enlargement with slight aortic atherosclerosis. Electronically Signed   By: Ashley Royalty M.D.   On: 02/15/2018 21:11   Dg Pelvis Portable  Result Date: 02/17/2018 CLINICAL DATA:  Left anterior hip revision for loosening. EXAM: PORTABLE PELVIS 1-2 VIEWS COMPARISON:  Preoperative study from 02/15/2018 and 01/11/2018 FINDINGS: A transverse minimally displaced fracture is seen of the left femoral diaphysis at the level of the previous left femoral component of a total hip arthroplasty. A longer uncemented femoral component is seen that goes beyond the level of the fracture. Similar appearing right hip arthroplasty with eccentrically located tip of femoral component abutting the lateral cortex. Lucency about the right femoral component is noted suspicious for loosening as well. IMPRESSION: 1. Minimally displaced transverse fracture of the left femoral diaphysis at site of the previous femoral component tip. New uncemented left hip arthroplasty with longer femoral stem noted. These results will be called to the ordering clinician or representative by the Radiologist Assistant, and communication documented in the PACS or zVision Dashboard. 2. Lucency noted adjacent to the tip of the patient's right femoral component with eccentrically located femoral stem. Findings are suspicious for loosening. No fracture is apparent on the right. Electronically Signed   By: Ashley Royalty M.D.   On: 02/17/2018 20:23   Dg Hip Unilat With Pelvis 2-3 Views Left  Result Date: 02/15/2018 CLINICAL DATA:  Left hip pain. EXAM: DG HIP (WITH OR WITHOUT PELVIS) 2-3V LEFT COMPARISON:  Radiographs of Jan 11, 2018. FINDINGS: Status post bilateral hip arthroplasties. Visualized portion of right hip prosthesis appears normal. However, there is again noted  lucency surrounding the distal tip of the femoral component of the left hip prosthesis with interval development of mildly displaced cortical fracture involving the proximal left femoral shaft. IMPRESSION: There is again noted lucency surrounding the distal portion of femoral component of left hip arthroplasty suggesting loosening. There does appear to be interval development of mildly displaced cortical fracture overlying the distal tip of the prosthesis in the proximal left femoral shaft. Electronically Signed   By: Marijo Conception, M.D.   On: 02/15/2018 14:11       Subjective: Patient is feeling better, no nausea or vomiting, left hip pain has improved, no further bleeding at the surgical site.   Discharge Exam: Vitals:   02/22/18 2005 02/23/18 0527  BP: 134/70 (!) 168/62  Pulse: 86 68  Resp: 18 20  Temp: 99.5 F (37.5 C) 98.5 F (36.9 C)  SpO2: 98% 99%   Vitals:   02/22/18 0536 02/22/18 1246 02/22/18 2005  02/23/18 0527  BP: (!) 148/63 (!) 157/63 134/70 (!) 168/62  Pulse: 72 81 86 68  Resp: 18 16 18 20   Temp: 97.6 F (36.4 C)  99.5 F (37.5 C) 98.5 F (36.9 C)  TempSrc: Oral  Oral Oral  SpO2: 98% 97% 98% 99%  Weight:      Height:        General: not in pain or dyspnea Neurology: Awake and alert, non focal  E ENT: mild pallor, no icterus, oral mucosa moist Cardiovascular: No JVD. S1-S2 present, rhythmic, no gallops, rubs, or murmurs. No lower extremity edema. Pulmonary: vesicular breath sounds bilaterally, adequate air movement, no wheezing, rhonchi or rales. Gastrointestinal. Abdomen protuberant no organomegaly, non tender, no rebound or guarding Skin. Positive ecchymosis at the lateral left hip, stable, dressing in place, no signs of active bleeding.  Musculoskeletal: no joint deformities   The results of significant diagnostics from this hospitalization (including imaging, microbiology, ancillary and laboratory) are listed below for reference.      Microbiology: Recent Results (from the past 240 hour(s))  Surgical pcr screen     Status: None   Collection Time: 02/17/18  6:55 AM  Result Value Ref Range Status   MRSA, PCR NEGATIVE NEGATIVE Final   Staphylococcus aureus NEGATIVE NEGATIVE Final    Comment: (NOTE) The Xpert SA Assay (FDA approved for NASAL specimens in patients 45 years of age and older), is one component of a comprehensive surveillance program. It is not intended to diagnose infection nor to guide or monitor treatment. Performed at [facility], [place] [address] [place], [place] [address]      Labs: BNP (last 3 results) No results for input(s): BNP in the last 8760 hours. Basic Metabolic Panel: Recent Labs  Lab 02/19/18 0430 02/20/18 0426 02/21/18 0408 02/22/18 0446 02/23/18 0451  NA 138 139 142 143 141  K 4.6 4.7 5.3* 4.6 4.4  CL 104 107 109 109 107  CO2 27 25 28 29 30   GLUCOSE 137* 85 81 96 93  BUN 39* 37* 33* 32* 29*  CREATININE 1.94* 1.53* 1.32* 1.33* 1.30*  CALCIUM 8.1* 8.0* 8.0* 8.2* 8.0*   Liver Function Tests: No results for input(s): AST, ALT, ALKPHOS, BILITOT, PROT, ALBUMIN in the last 168 hours. No results for input(s): LIPASE, AMYLASE in the last 168 hours. No results for input(s): AMMONIA in the last 168 hours. CBC: Recent Labs  Lab 02/17/18 0445 02/18/18 0447 02/19/18 0430 02/22/18 0446 02/23/18 0451 02/23/18 1110  WBC 9.6 13.4* 13.2*  --  7.4  --   NEUTROABS 6.6 11.1* 9.9*  --  4.5  --   HGB 11.8* 9.8* 9.8* 8.8* 8.3* 8.9*  HCT 37.3* 31.0* 30.3* 27.4* 26.8* 28.2*  MCV 96.1 97.8 96.8  --  98.2  --   PLT 178 147* 161  --  196  --    Cardiac Enzymes: No results for input(s): CKTOTAL, CKMB, CKMBINDEX, TROPONINI in the last 168 hours. BNP: Invalid input(s): POCBNP CBG: No results for input(s): GLUCAP in the last 168 hours. D-Dimer No results for input(s): DDIMER in the last 72 hours. Hgb A1c No results for input(s): HGBA1C in the last 72  hours. Lipid Profile No results for input(s): CHOL, HDL, LDLCALC, TRIG, CHOLHDL, LDLDIRECT in the last 72 hours. Thyroid function studies No results for input(s): TSH, T4TOTAL, T3FREE, THYROIDAB in the last 72 hours.  Invalid input(s): FREET3 Anemia work up No results for input(s): VITAMINB12, FOLATE, FERRITIN, TIBC, IRON, RETICCTPCT in the last  72 hours. Urinalysis    Component Value Date/Time   COLORURINE YELLOW 02/15/2018 1947   APPEARANCEUR CLEAR 02/15/2018 1947   APPEARANCEUR Clear 01/18/2018 1539   LABSPEC 1.008 02/15/2018 1947   PHURINE 6.0 02/15/2018 1947   GLUCOSEU NEGATIVE 02/15/2018 1947   HGBUR NEGATIVE 02/15/2018 [place] 02/15/2018 1947   BILIRUBINUR Negative 01/18/2018 [place] 02/15/2018 1947   PROTEINUR NEGATIVE 02/15/2018 1947   UROBILINOGEN 0.2 09/06/2012 0213   NITRITE NEGATIVE 02/15/2018 [place] 02/15/2018 1947   LEUKOCYTESUR Negative 01/18/2018 1539   Sepsis Labs Invalid input(s): PROCALCITONIN,  WBC,  LACTICIDVEN Microbiology Recent Results (from the past 240 hour(s))  Surgical pcr screen     Status: None   Collection Time: 02/17/18  6:55 AM  Result Value Ref Range Status   MRSA, PCR NEGATIVE NEGATIVE Final   Staphylococcus aureus NEGATIVE NEGATIVE Final    Comment: (NOTE) The Xpert SA Assay (FDA approved for NASAL specimens in patients 35 years of age and older), is one component of a comprehensive surveillance program. It is not intended to diagnose infection nor to guide or monitor treatment. Performed at [facility], [place] [address], [place],  [address]      Time coordinating discharge: 45 minutes  SIGNED:   Tawni Millers, MD  [facility] 02/23/2018, 12:23 PM Pager [phone]  If 7PM-7AM, please contact night-coverage [email or web address] Password [email or web address]

## 2018-02-23 NOTE — Clinical Social Work Placement (Signed)
Pt admitting to [facility] report # [phone]. [facility] transport arranged.  Provided DC information via the [place] and facility obtained [place] authorization. Pt's daughter bringing pt's CPAP machine to facility for pm use.   CLINICAL SOCIAL WORK PLACEMENT  NOTE  Date:  02/23/2018  Patient Details  Name: [name] MRN: [id number] Date of Birth: [birth year]  Clinical Social Work is seeking post-discharge placement for this patient at the [place] level of care (*CSW will initial, date and re-position this form in  chart as items are completed):  Yes   Patient/family provided with [place] Work Department's list of facilities offering this level of care within the geographic area requested by the patient (or if unable, by the patient's family).  Yes   Patient/family informed of their freedom to choose among providers that offer the needed level of care, that participate in Medicare, Medicaid or managed care program needed by the patient, have an available bed and are willing to accept the patient.  Yes   Patient/family informed of [facility]'s ownership interest in [facility] and [facility], as well as of the fact that they are under no obligation to receive care at these facilities.  PASRR submitted to EDS on       PASRR number received on       Existing PASRR number confirmed on 02/21/18     FL2 transmitted to all facilities in geographic area requested by pt/family on 02/21/18     FL2 transmitted to all facilities within larger geographic area on       Patient informed that his/her managed care company has contracts with or will negotiate with certain facilities, including the following:        Yes   Patient/family informed of bed offers received.  Patient chooses bed at [facility]     Physician recommends and patient chooses bed at [facility]    Patient to be transferred to [facility] on  02/22/18.  Patient to be transferred to facility by [facility]     Patient family notified on 02/22/18 of transfer.  Name of family member notified:  pt is alert/oriented and states he will inform family/friend     PHYSICIAN       Additional Comment:    _______________________________________________ Nila Nephew, LCSW 02/23/2018, 2:30 PM  [phone]

## 2018-02-23 NOTE — Progress Notes (Signed)
[facility] is here to transport patient to [facility].

## 2018-03-15 ENCOUNTER — Other Ambulatory Visit: Payer: Self-pay

## 2018-03-15 ENCOUNTER — Inpatient Hospital Stay (HOSPITAL_COMMUNITY)
Admission: EM | Admit: 2018-03-15 | Discharge: 2018-03-23 | DRG: 464 | Disposition: A | Discharge status: Skilled Nursing Facility | Payer: Medicare Other | Attending: Orthopedic Surgery | Admitting: Orthopedic Surgery

## 2018-03-15 ENCOUNTER — Emergency Department (HOSPITAL_COMMUNITY): Payer: Medicare Other

## 2018-03-15 ENCOUNTER — Encounter (HOSPITAL_COMMUNITY): Payer: Self-pay

## 2018-03-15 DIAGNOSIS — Z79899 Other long term (current) drug therapy: Secondary | ICD-10-CM

## 2018-03-15 DIAGNOSIS — G4730 Sleep apnea, unspecified: Secondary | ICD-10-CM | POA: Diagnosis present

## 2018-03-15 DIAGNOSIS — K5900 Constipation, unspecified: Secondary | ICD-10-CM | POA: Diagnosis present

## 2018-03-15 DIAGNOSIS — T8149XA Infection following a procedure, other surgical site, initial encounter: Secondary | ICD-10-CM | POA: Diagnosis present

## 2018-03-15 DIAGNOSIS — T82594A Other mechanical complication of infusion catheter, initial encounter: Secondary | ICD-10-CM | POA: Diagnosis present

## 2018-03-15 DIAGNOSIS — Z87891 Personal history of nicotine dependence: Secondary | ICD-10-CM

## 2018-03-15 DIAGNOSIS — Y831 Surgical operation with implant of artificial internal device as the cause of abnormal reaction of the patient, or of later complication, without mention of misadventure at the time of the procedure: Secondary | ICD-10-CM | POA: Diagnosis present

## 2018-03-15 DIAGNOSIS — Z7982 Long term (current) use of aspirin: Secondary | ICD-10-CM | POA: Diagnosis not present

## 2018-03-15 DIAGNOSIS — Z6841 Body Mass Index (BMI) 40.0 and over, adult: Secondary | ICD-10-CM | POA: Diagnosis not present

## 2018-03-15 DIAGNOSIS — T8452XD Infection and inflammatory reaction due to internal left hip prosthesis, subsequent encounter: Secondary | ICD-10-CM | POA: Diagnosis not present

## 2018-03-15 DIAGNOSIS — M316 Other giant cell arteritis: Secondary | ICD-10-CM | POA: Diagnosis present

## 2018-03-15 DIAGNOSIS — Z91030 Bee allergy status: Secondary | ICD-10-CM | POA: Diagnosis not present

## 2018-03-15 DIAGNOSIS — K219 Gastro-esophageal reflux disease without esophagitis: Secondary | ICD-10-CM | POA: Diagnosis present

## 2018-03-15 DIAGNOSIS — Z95828 Presence of other vascular implants and grafts: Secondary | ICD-10-CM | POA: Diagnosis not present

## 2018-03-15 DIAGNOSIS — D62 Acute posthemorrhagic anemia: Secondary | ICD-10-CM | POA: Diagnosis not present

## 2018-03-15 DIAGNOSIS — Y712 Prosthetic and other implants, materials and accessory cardiovascular devices associated with adverse incidents: Secondary | ICD-10-CM | POA: Diagnosis present

## 2018-03-15 DIAGNOSIS — M170 Bilateral primary osteoarthritis of knee: Secondary | ICD-10-CM | POA: Diagnosis present

## 2018-03-15 DIAGNOSIS — G4700 Insomnia, unspecified: Secondary | ICD-10-CM | POA: Diagnosis present

## 2018-03-15 DIAGNOSIS — Z9884 Bariatric surgery status: Secondary | ICD-10-CM

## 2018-03-15 DIAGNOSIS — Z9049 Acquired absence of other specified parts of digestive tract: Secondary | ICD-10-CM | POA: Diagnosis not present

## 2018-03-15 DIAGNOSIS — Z96642 Presence of left artificial hip joint: Secondary | ICD-10-CM | POA: Diagnosis present

## 2018-03-15 DIAGNOSIS — Z7952 Long term (current) use of systemic steroids: Secondary | ICD-10-CM

## 2018-03-15 DIAGNOSIS — Z87311 Personal history of (healed) other pathological fracture: Secondary | ICD-10-CM | POA: Diagnosis not present

## 2018-03-15 DIAGNOSIS — F1190 Opioid use, unspecified, uncomplicated: Secondary | ICD-10-CM | POA: Diagnosis present

## 2018-03-15 DIAGNOSIS — I10 Essential (primary) hypertension: Secondary | ICD-10-CM | POA: Diagnosis present

## 2018-03-15 DIAGNOSIS — T8452XA Infection and inflammatory reaction due to internal left hip prosthesis, initial encounter: Secondary | ICD-10-CM | POA: Diagnosis present

## 2018-03-15 DIAGNOSIS — Z811 Family history of alcohol abuse and dependence: Secondary | ICD-10-CM

## 2018-03-15 DIAGNOSIS — M869 Osteomyelitis, unspecified: Secondary | ICD-10-CM | POA: Diagnosis not present

## 2018-03-15 LAB — CBC WITH DIFFERENTIAL/PLATELET
Basophils Absolute: 0 10*3/uL (ref 0.0–0.1)
Basophils Relative: 0 %
Eosinophils Absolute: 0 10*3/uL (ref 0.0–0.7)
Eosinophils Relative: 1 %
HCT: 30 % — ABNORMAL LOW (ref 39.0–52.0)
Hemoglobin: 9.2 g/dL — ABNORMAL LOW (ref 13.0–17.0)
Lymphocytes Relative: 19 %
Lymphs Abs: 1 10*3/uL (ref 0.7–4.0)
MCH: 30.6 pg (ref 26.0–34.0)
MCHC: 30.7 g/dL (ref 30.0–36.0)
MCV: 99.7 fL (ref 78.0–100.0)
Monocytes Absolute: 0.8 10*3/uL (ref 0.1–1.0)
Monocytes Relative: 15 %
Neutro Abs: 3.4 10*3/uL (ref 1.7–7.7)
Neutrophils Relative %: 65 %
Platelets: 209 10*3/uL (ref 150–400)
RBC: 3.01 MIL/uL — ABNORMAL LOW (ref 4.22–5.81)
RDW: 16 % — ABNORMAL HIGH (ref 11.5–15.5)
WBC: 5.2 10*3/uL (ref 4.0–10.5)

## 2018-03-15 LAB — COMPREHENSIVE METABOLIC PANEL
ALT: 52 U/L — ABNORMAL HIGH (ref 0–44)
AST: 55 U/L — ABNORMAL HIGH (ref 15–41)
Albumin: 2.4 g/dL — ABNORMAL LOW (ref 3.5–5.0)
Alkaline Phosphatase: 115 U/L (ref 38–126)
Anion gap: 8 (ref 5–15)
BUN: 20 mg/dL (ref 8–23)
CO2: 29 mmol/L (ref 22–32)
Calcium: 8.3 mg/dL — ABNORMAL LOW (ref 8.9–10.3)
Chloride: 105 mmol/L (ref 98–111)
Creatinine, Ser: 1.13 mg/dL (ref 0.61–1.24)
GFR calc Af Amer: >60 mL/min (ref >60)
GFR calc non Af Amer: >60 mL/min (ref >60)
Glucose, Bld: 111 mg/dL — ABNORMAL HIGH (ref 70–99)
Potassium: 4.7 mmol/L (ref 3.5–5.1)
Sodium: 142 mmol/L (ref 135–145)
Total Bilirubin: 0.7 mg/dL (ref 0.3–1.2)
Total Protein: 6.1 g/dL — ABNORMAL LOW (ref 6.5–8.1)

## 2018-03-15 LAB — URINALYSIS, ROUTINE W REFLEX MICROSCOPIC
Bilirubin Urine: NEGATIVE
Glucose, UA: NEGATIVE mg/dL
Hgb urine dipstick: NEGATIVE
Ketones, ur: NEGATIVE mg/dL
Leukocytes, UA: NEGATIVE
Nitrite: NEGATIVE
Protein, ur: NEGATIVE mg/dL
Specific Gravity, Urine: 1.006 (ref 1.005–1.030)
pH: 7 (ref 5.0–8.0)

## 2018-03-15 LAB — I-STAT CG4 LACTIC ACID, ED
Lactic Acid, Venous: 0.93 mmol/L (ref 0.5–1.9)
Lactic Acid, Venous: 0.59 mmol/L (ref 0.5–1.9)

## 2018-03-15 MED ORDER — PIPERACILLIN-TAZOBACTAM 3.375 G IVPB
3.3750 g | Freq: Three times a day (TID) | INTRAVENOUS | Status: DC
  Administered 2018-03-16 – 2018-03-20 (×13): 3.375 g via INTRAVENOUS
  Filled 2018-03-15 (×15): qty 50

## 2018-03-15 MED ORDER — ACETAMINOPHEN 325 MG PO TABS
650.0000 mg | ORAL_TABLET | Freq: Once | ORAL | Status: AC
  Administered 2018-03-15: 650 mg via ORAL
  Filled 2018-03-15: qty 2

## 2018-03-15 MED ORDER — POLYETHYLENE GLYCOL 3350 17 G PO PACK: 17.0000 g | PACK | Freq: Every day | ORAL | Status: DC | PRN

## 2018-03-15 MED ORDER — AMITRIPTYLINE HCL 25 MG PO TABS
25.0000 mg | ORAL_TABLET | Freq: Every day | ORAL | Status: DC
  Administered 2018-03-15 – 2018-03-22 (×8): 25 mg via ORAL
  Filled 2018-03-15 (×9): qty 1

## 2018-03-15 MED ORDER — DIPHENHYDRAMINE HCL 25 MG PO CAPS: 25.0000 mg | ORAL_CAPSULE | Freq: Three times a day (TID) | ORAL | Status: DC | PRN

## 2018-03-15 MED ORDER — ASPIRIN EC 81 MG PO TBEC
81.0000 mg | DELAYED_RELEASE_TABLET | Freq: Every evening | ORAL | Status: DC
  Administered 2018-03-15: 81 mg via ORAL
  Filled 2018-03-15 (×2): qty 1

## 2018-03-15 MED ORDER — VANCOMYCIN HCL 10 G IV SOLR
1750.0000 mg | INTRAVENOUS | Status: DC
  Administered 2018-03-16 – 2018-03-19 (×4): 1750 mg via INTRAVENOUS
  Filled 2018-03-15: qty 1750
  Filled 2018-03-15: qty 750
  Filled 2018-03-15 (×5): qty 1750

## 2018-03-15 MED ORDER — METHOCARBAMOL 500 MG PO TABS
750.0000 mg | ORAL_TABLET | Freq: Four times a day (QID) | ORAL | Status: DC
  Administered 2018-03-15 – 2018-03-23 (×30): 750 mg via ORAL
  Filled 2018-03-15 (×28): qty 2

## 2018-03-15 MED ORDER — ACETAMINOPHEN 325 MG PO TABS
650.0000 mg | ORAL_TABLET | Freq: Four times a day (QID) | ORAL | Status: DC | PRN
  Administered 2018-03-16 – 2018-03-20 (×4): 650 mg via ORAL
  Filled 2018-03-15 (×4): qty 2

## 2018-03-15 MED ORDER — MORPHINE SULFATE (PF) 4 MG/ML IV SOLN
4.0000 mg | Freq: Once | INTRAVENOUS | Status: AC
  Administered 2018-03-15: 4 mg via INTRAVENOUS
  Filled 2018-03-15: qty 1

## 2018-03-15 MED ORDER — SODIUM CHLORIDE 0.9 % IV BOLUS
1000.0000 mL | Freq: Once | INTRAVENOUS | Status: AC
  Administered 2018-03-15: 1000 mL via INTRAVENOUS

## 2018-03-15 MED ORDER — VANCOMYCIN HCL 10 G IV SOLR
2500.0000 mg | Freq: Once | INTRAVENOUS | Status: AC
  Administered 2018-03-15: 2500 mg via INTRAVENOUS
  Filled 2018-03-15: qty 2500

## 2018-03-15 MED ORDER — FLEET ENEMA 7-19 GM/118ML RE ENEM
1.0000 | ENEMA | Freq: Once | RECTAL | Status: DC | PRN
Start: 2018-03-15 — End: 2018-03-23

## 2018-03-15 MED ORDER — DOCUSATE SODIUM 100 MG PO CAPS
100.0000 mg | ORAL_CAPSULE | Freq: Two times a day (BID) | ORAL | Status: DC
  Administered 2018-03-15 – 2018-03-16 (×3): 100 mg via ORAL
  Filled 2018-03-15 (×3): qty 1

## 2018-03-15 MED ORDER — DIPHENHYDRAMINE HCL 25 MG PO TABS
25.0000 mg | ORAL_TABLET | Freq: Three times a day (TID) | ORAL | Status: DC | PRN
  Filled 2018-03-15: qty 2

## 2018-03-15 MED ORDER — AMLODIPINE BESYLATE 10 MG PO TABS
10.0000 mg | ORAL_TABLET | Freq: Every day | ORAL | Status: DC
  Administered 2018-03-16 – 2018-03-23 (×7): 10 mg via ORAL
  Filled 2018-03-15 (×7): qty 1

## 2018-03-15 MED ORDER — PIPERACILLIN-TAZOBACTAM 3.375 G IVPB 30 MIN
3.3750 g | Freq: Once | INTRAVENOUS | Status: AC
  Administered 2018-03-15: 3.375 g via INTRAVENOUS
  Filled 2018-03-15: qty 50

## 2018-03-15 MED ORDER — CYCLOSPORINE 0.05 % OP EMUL
1.0000 [drp] | Freq: Two times a day (BID) | OPHTHALMIC | Status: DC
  Administered 2018-03-15 – 2018-03-23 (×16): 1 [drp] via OPHTHALMIC
  Filled 2018-03-15 (×17): qty 1

## 2018-03-15 MED ORDER — METHADONE HCL 10 MG PO TABS
10.0000 mg | ORAL_TABLET | Freq: Four times a day (QID) | ORAL | Status: DC
  Administered 2018-03-15 – 2018-03-23 (×30): 10 mg via ORAL
  Filled 2018-03-15 (×29): qty 1

## 2018-03-15 MED ORDER — ACETAMINOPHEN 650 MG RE SUPP: 650.0000 mg | Freq: Four times a day (QID) | RECTAL | Status: DC | PRN

## 2018-03-15 MED ORDER — DARIFENACIN HYDROBROMIDE ER 7.5 MG PO TB24
7.5000 mg | ORAL_TABLET | Freq: Every day | ORAL | Status: DC
  Administered 2018-03-16 – 2018-03-23 (×7): 7.5 mg via ORAL
  Filled 2018-03-15 (×8): qty 1

## 2018-03-15 MED ORDER — PREDNISONE 20 MG PO TABS
20.0000 mg | ORAL_TABLET | Freq: Two times a day (BID) | ORAL | Status: DC
  Filled 2018-03-15: qty 1

## 2018-03-15 MED ORDER — ONDANSETRON HCL 4 MG PO TABS
4.0000 mg | ORAL_TABLET | Freq: Four times a day (QID) | ORAL | Status: DC | PRN
  Administered 2018-03-18: 4 mg via ORAL
  Filled 2018-03-15: qty 1

## 2018-03-15 MED ORDER — ASPIRIN EC 81 MG PO TBEC: 81.0000 mg | DELAYED_RELEASE_TABLET | Freq: Every day | ORAL | Status: DC

## 2018-03-15 MED ORDER — BISACODYL 10 MG RE SUPP: 10.0000 mg | Freq: Every day | RECTAL | Status: DC | PRN

## 2018-03-15 MED ORDER — SODIUM CHLORIDE 0.9 % IV BOLUS
500.0000 mL | Freq: Once | INTRAVENOUS | Status: AC
  Administered 2018-03-15: 500 mL via INTRAVENOUS

## 2018-03-15 MED ORDER — PANTOPRAZOLE SODIUM 40 MG PO TBEC
40.0000 mg | DELAYED_RELEASE_TABLET | Freq: Every day | ORAL | Status: DC
  Administered 2018-03-16 – 2018-03-23 (×8): 40 mg via ORAL
  Filled 2018-03-15 (×7): qty 1

## 2018-03-15 MED ORDER — ONDANSETRON HCL 4 MG/2ML IJ SOLN: 4.0000 mg | Freq: Four times a day (QID) | INTRAMUSCULAR | Status: DC | PRN

## 2018-03-15 MED ORDER — SODIUM CHLORIDE 0.9 % IV SOLN
250.0000 mL | INTRAVENOUS | Status: DC | PRN
  Administered 2018-03-21: 250 mL via INTRAVENOUS

## 2018-03-15 MED ORDER — VENLAFAXINE HCL ER 150 MG PO CP24
300.0000 mg | ORAL_CAPSULE | Freq: Every day | ORAL | Status: DC
  Administered 2018-03-16 – 2018-03-23 (×8): 300 mg via ORAL
  Filled 2018-03-15 (×9): qty 2

## 2018-03-15 MED ORDER — GABAPENTIN 300 MG PO CAPS
900.0000 mg | ORAL_CAPSULE | Freq: Every day | ORAL | Status: DC
  Administered 2018-03-15 – 2018-03-22 (×8): 900 mg via ORAL
  Filled 2018-03-15 (×8): qty 3

## 2018-03-15 MED ORDER — FAMOTIDINE 20 MG PO TABS
20.0000 mg | ORAL_TABLET | Freq: Every day | ORAL | Status: DC
  Administered 2018-03-15 – 2018-03-17 (×3): 20 mg via ORAL
  Filled 2018-03-15 (×3): qty 1

## 2018-03-15 MED ORDER — SODIUM CHLORIDE 0.9 % IV SOLN
INTRAVENOUS | Status: DC
  Administered 2018-03-15 – 2018-03-21 (×6): via INTRAVENOUS
  Administered 2018-03-22: 1000 mL via INTRAVENOUS
  Administered 2018-03-22: 12:00:00 via INTRAVENOUS
  Administered 2018-03-22: 1000 mL via INTRAVENOUS

## 2018-03-15 MED ORDER — SODIUM CHLORIDE 0.9% FLUSH: 3.0000 mL | INTRAVENOUS | Status: DC | PRN

## 2018-03-15 MED ORDER — GABAPENTIN 300 MG PO CAPS
600.0000 mg | ORAL_CAPSULE | Freq: Every day | ORAL | Status: DC
  Administered 2018-03-16 – 2018-03-23 (×7): 600 mg via ORAL
  Filled 2018-03-15 (×6): qty 2

## 2018-03-15 MED ORDER — FUROSEMIDE 20 MG PO TABS: 20.0000 mg | ORAL_TABLET | Freq: Every day | ORAL | Status: DC | PRN

## 2018-03-15 MED ORDER — SODIUM CHLORIDE 0.9% FLUSH
3.0000 mL | Freq: Two times a day (BID) | INTRAVENOUS | Status: DC
  Administered 2018-03-16: 3 mL via INTRAVENOUS

## 2018-03-15 NOTE — Progress Notes (Signed)
Pt requesting food to eat. He has regular diet ordered in computer. Checked with his nurse. Food given to pt. Lucius Conn BSN, RN-BC Admissions RN 03/15/2018 7:23 PM

## 2018-03-15 NOTE — ED Notes (Signed)
Both cultures taken before antibiotics. First from left [facility], second from right upper arm PICC.

## 2018-03-15 NOTE — ED Notes (Signed)
Attempted to give report to [place], RN for room 1308. He questioned about the cardiac monitor order. Paged orthopedic to clarify order.

## 2018-03-15 NOTE — Progress Notes (Signed)
Pharmacy Antibiotic Note  [name] is a 72 y.o. male admitted on 03/15/2018 with wound infection s/p left total hip revision.  Pharmacy has been consulted for vanc/Zosyn dosing.  Plan: 1) Vancomycin 2500mg  IV x 1 then 1750mg  IV q24 - goal AUC 400-500 2) Zosyn 3.375g IV q8 (extended interval infusion) 3) Daily SCR  Height: 5\' 8"  (172.7 cm) Weight: (!) 343 lb (155.6 kg) IBW/kg (Calculated) : 68.4  Temp (24hrs), Avg:99 F (37.2 C), Min:99 F (37.2 C), Max:99 F (37.2 C)  Recent Labs  Lab 03/15/18 1533 03/15/18 1539 03/15/18 1724  WBC 5.2  --   --   CREATININE 1.13  --   --   LATICACIDVEN  --  0.93 0.59    Estimated Creatinine Clearance: 87.6 mL/min (by C-G formula based on SCr of 1.13 mg/dL).    Allergies  Allergen Reactions  . Bee Venom Swelling     Thank you for allowing pharmacy to be a part of this patient's care.  Kara Mead 03/15/2018 7:23 PM

## 2018-03-15 NOTE — ED Notes (Signed)
Notified transport for patient.

## 2018-03-15 NOTE — ED Notes (Signed)
Spoke with Dr. Mitzi Hansen about cardiac montioring. He gave a verbal to discontinue order.

## 2018-03-15 NOTE — ED Triage Notes (Signed)
Patient arrived via [facility] from [facility] facility. Patient c/o left hip pain. Red and draining at incision. Patient is post hip replacement surgery 3 week ago. Patient had a hip replacement years ago and also had a fractured femur. Patient has been running a low grade fever. Patient had a picc line put in Saturday and antibiotics started 2 days ago. 650mg  of tylenol given at 1000 per facility. Temp-99.

## 2018-03-15 NOTE — ED Notes (Signed)
Bed: WA06 Expected date:  Expected time:  Means of arrival:  Comments: ems 

## 2018-03-15 NOTE — ED Provider Notes (Signed)
[facility] DEPT Provider Note   CSN: [id number] Arrival date & time: 03/15/18  1447     History   Chief Complaint Chief Complaint  Patient presents with  . left hip pain  . possible infestion in his area    HPI [name] is a 72 y.o. male who recently underwent a left total hip arthroplasty with revision by Dr. Wynelle Link on 02/17/2018 and was discharged to [facility] skilled nursing facility on 02/23/18 who presents the emergency department today for possible infection of the left hip.  History obtained by patient and skilled nursing facility.  Patient was reported to have drainage since discharge from the left hip.  On 7/10 the drainage became purulent and had a foul odor to it on 7/12.  The report that the patient began running a fever of 101.8 on 7/11 that continued into 7/12 (100.4).  They have been managing the fever with 1000 mg of Tylenol 4 times daily.  He was reported to have a PICC line placed on Friday, 7/12 and was started on vancomycin and Zosyn.  He was sent over today for concerns of a possible infected hip.  Patient reports that he has not been ambulatory denies any trauma since event.  He notes some chills and associated fever as well as some nausea but denies any emesis.  He denies any chest pain, shortness of breath, abdominal pain, diarrhea or urinary symptoms.  He notes that he has been receiving wound care for the left hip with frequent dressing changes including this morning.  It appears that the dressing is completely saturated and Ehieli was discharged this current time.  He has not followed up with his orthopedist since discharge. He last was given 650mg  Tylenol at 1000 this morning.   HPI  Past Medical History:  Diagnosis Date  . Anginal pain ([place]) 2006   evaluated by cardio  . Arthritis    knees,feet,shoulders,elbows.hands  . Constipation   . Dyspnea    with exertion   . Dysrhythmia    Atrial Flutter- 2006- corredted itself    . Family history of adverse reaction to anesthesia    mother- with novocaine went into shock  . GERD (gastroesophageal reflux disease)   . Headache   . Hemorrhoids   . History of blood transfusion   . Hypertension   . Insomnia   . Sleep apnea    cpap  . 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 Femur fracture, left ([place]) 02/15/2018  . Anemia 02/15/2018  . Femur fracture ([place]) 02/15/2018  . 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 SOB (shortness of breath) 03/31/2014  . Hypertension   . Hyponatremia 03/18/2012  . 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 APPENDECTOMY    . ARTERY BIOPSY Left 12/30/2017   Procedure: BIOPSY TEMPORAL ARTERY;  Surgeon: Fanny Skates, MD;  Location: [facility] ORS;  Service: General;  Laterality: Left;  . CHOLECYSTECTOMY    . COLONOSCOPY W/ POLYPECTOMY    . ESOPHAGOGASTRODUODENOSCOPY (EGD) WITH PROPOFOL  07/06/2012   Procedure: ESOPHAGOGASTRODUODENOSCOPY (EGD) WITH PROPOFOL;  Surgeon: Jeryl Columbia, MD;  Location: [facility] END[facility];  Service: Endoscopy;  Laterality: N/A;  . ESOPHAGOSCOPY    . EYE SURGERY     left eye- muscle repair  . HERNIA REPAIR     ventral hernia  . JOINT REPLACEMENT  bilateral hips.  Right broke and had to be re placed again  . MASS EXCISION N/A 03/02/2015   Procedure: EXCISION ABDOMINAL WALL MASS;  Surgeon: Alphonsa Overall, MD;  Location: [facility];  Service: General;  Laterality: N/A;  . TOTAL HIP REVISION Left 02/17/2018   Procedure: Left total hip arthroplasty revision;  Surgeon: Gaynelle Arabian, MD;  Location: [facility];  Service: Orthopedics;  Laterality: Left;  Marland Kitchen VERTICAL BANDED GASTROPLASTY          Home Medications    Prior to Admission medications   Medication Sig Start Date End Date Taking? Authorizing Provider  amitriptyline (ELAVIL) 25 MG tablet Take 25 mg by mouth at bedtime.     [provider]  amLODipine (NORVASC) 10 MG tablet Take 10 mg by mouth daily.    [provider]  aspirin EC 81 MG tablet Take 81 mg by mouth every evening.     [provider]  Cholecalciferol (VITAMIN D3) 2000 units TABS Take 2,000 Units by mouth every evening.     [provider]  cycloSPORINE (RESTASIS) 0.05 % ophthalmic emulsion Place 1 drop into both eyes 2 (two) times daily.     [provider]  diphenhydrAMINE (BENADRYL) 25 MG tablet Take 25-50 mg by mouth every 8 (eight) hours as needed for allergies (bee stings).     [provider]  famotidine (PEPCID) 20 MG tablet Take 1 tablet (20 mg total) by mouth at bedtime. 02/23/18 03/25/18  Arrien, Jimmy Picket, MD  furosemide (LASIX) 20 MG tablet Take 1 tablet (20 mg total) by mouth daily as needed for fluid or edema (as needed for edema.). 02/23/18   Arrien, Jimmy Picket, MD  gabapentin (NEURONTIN) 300 MG capsule Take 600-900 mg by mouth See admin instructions. Take 2 capsules in the morning & 3 capsules at bedtim    [provider]  methadone (DOLOPHINE) 10 MG tablet Take 10 mg by mouth 4 (four) times daily.     [provider]  methocarbamol (ROBAXIN) 750 MG tablet Take 750 mg by mouth 4 (four) times daily.    [provider]  Multiple Vitamins-Minerals (CENTRUM ADULTS PO) Take 1 tablet by mouth every evening.     [provider]  omeprazole (PRILOSEC) 40 MG capsule Take 40 mg by mouth daily.    [provider]  polyethylene glycol (MIRALAX / GLYCOLAX) packet Take 17 g by mouth daily as needed for mild constipation. 02/23/18   Arrien, Jimmy Picket, MD  predniSONE (DELTASONE) 20 MG tablet Take 1 tablet (20 mg total) by mouth 2 (two) times daily with a meal. 02/23/18   Arrien, Jimmy Picket, MD  PRESCRIPTION MEDICATION Apply 1 application topically at bedtime. Testosterone Cream 10%.  Apply 1 gm daily as directed.     [provider]    rivaroxaban (XARELTO) 10 MG TABS tablet Take 1 tablet (10 mg total) by mouth daily with breakfast for 15 days. Take one 10 mg tablet Xarelto once a day AND one 81 mg Aspirin once a day for three weeks following surgery to prevent blood clots.  Then discontinue Xarelto and resume one 81 mg Aspirin once a day. 02/23/18 03/10/18  Edmisten, Ok Anis, PA  solifenacin (VESICARE) 10 MG tablet Take 10 mg by mouth daily.    [provider]  venlafaxine XR (EFFEXOR-XR) 150 MG 24 hr capsule Take 300 mg by mouth daily with breakfast.    [provider]    Family History Family History  Problem  Relation Age of Onset  . Cancer Mother   . Alcohol abuse Father     Social History Social History   Tobacco Use  . Smoking status: Former Smoker    Packs/day: 2.00    Years: 10.00    Pack years: 20.00    Types: Cigarettes    Start date: 05/16/1966    Last attempt to quit: 03/11/1984    Years since quitting: 34.0  . Smokeless tobacco: Never Used  Substance Use Topics  . Alcohol use: Yes    Alcohol/week: 0.0 oz    Comment: occasionally a couple of times a year   . Drug use: No     Allergies   Bee venom   Review of Systems Review of Systems  All other systems reviewed and are negative.    Physical Exam Updated Vital Signs BP (!) 152/65 (BP Location: Left Arm)   Pulse 89   Temp 99 F (37.2 C) (Oral)   Resp 16   Ht 5\' 8"  (1.727 m)   Wt (!) 155.6 kg (343 lb)   SpO2 97%   BMI 52.15 kg/m   Physical Exam  Constitutional: He appears well-developed and well-nourished.  Obese male in no acute distress  HENT:  Head: Normocephalic and atraumatic.  Right Ear: External ear normal.  Left Ear: External ear normal.  Nose: Nose normal.  Mouth/Throat: Uvula is midline, oropharynx is clear and moist and mucous membranes are normal. No tonsillar exudate.  Eyes: Pupils are equal, round, and reactive to light. Right eye exhibits no discharge. Left eye exhibits no discharge. No  scleral icterus.  Neck: Trachea normal. Neck supple. No spinous process tenderness present. No neck rigidity. Normal range of motion present.  Cardiovascular: Normal rate, regular rhythm and intact distal pulses.  No murmur heard. Pulses:      Radial pulses are 2+ on the right side, and 2+ on the left side.       Dorsalis pedis pulses are 2+ on the right side, and 2+ on the left side.       Posterior tibial pulses are 2+ on the right side, and 2+ on the left side.  No lower extremity swelling or edema. Calves symmetric in size bilaterally.  Pulmonary/Chest: Effort normal and breath sounds normal. He exhibits no tenderness.  Abdominal: Soft. Bowel sounds are normal. There is no tenderness. There is no rebound and no guarding.  Musculoskeletal: He exhibits no edema.  No deformity.  No leg shortening or external rotation. There is tenderness of the right hip on palpation and with minimal movement.   Lymphadenopathy:    He has no cervical adenopathy.  Neurological: He is alert. He has normal strength. No sensory deficit.  Normal sensation distally  Skin: Skin is warm and dry. No rash noted. He is not diaphoretic.  There is a small area of dehiscence of the wound on the distal third of the surgical site as seen in the picture below.  There is a foul, purulent/yellow discharge noted from the wound.  There is minimal erythema with some heat in the surrounding areas of the incision.  He is tender over this area with minimal palpation.  Psychiatric: He has a normal mood and affect.  Nursing note and vitals reviewed.      ED Treatments / Results  Labs (all labs ordered are listed, but only abnormal results are displayed) Labs Reviewed  COMPREHENSIVE METABOLIC PANEL - Abnormal; Notable for the following components:      Result Value  Glucose, Bld 111 (*)    Calcium 8.3 (*)    Total Protein 6.1 (*)    Albumin 2.4 (*)    AST 55 (*)    ALT 52 (*)    All other components within normal limits    CBC WITH DIFFERENTIAL/PLATELET - Abnormal; Notable for the following components:   RBC 3.01 (*)    Hemoglobin 9.2 (*)    HCT 30.0 (*)    RDW 16.0 (*)    All other components within normal limits  CULTURE, BLOOD (ROUTINE X 2)  CULTURE, BLOOD (ROUTINE X 2)  URINALYSIS, ROUTINE W REFLEX MICROSCOPIC  COMPREHENSIVE METABOLIC PANEL  CBC  I-STAT CG4 LACTIC ACID, ED  I-STAT CG4 LACTIC ACID, ED    EKG EKG Interpretation  Date/Time:  Monday March 15 2018 16:05:38 EDT Ventricular Rate:  86 PR Interval:    QRS Duration: 79 QT Interval:  363 QTC Calculation: 435 R Axis:   64 Text Interpretation:  Sinus rhythm Low voltage, extremity and precordial leads When compared to prior, no significant changes seen.  No STEMI Confirmed by Antony Blackbird [phone]) on 03/15/2018 4:58:45 PM   Radiology Dg Chest Portable 1 View  Result Date: 03/15/2018 CLINICAL DATA:  PICC line placement. EXAM: PORTABLE CHEST 1 VIEW COMPARISON:  Chest x-ray dated February 15, 2018. FINDINGS: Interval placement of a right upper extremity PICC line with the tip in the distal SVC. Stable cardiomegaly. Normal pulmonary vascularity. Unchanged chronic pleural thickening and subpleural scarring in the left mid lung. Unchanged chronic blunting of the left costophrenic angle. No consolidation, large pleural effusion, or pneumothorax. No acute osseous abnormality. IMPRESSION: 1. Right upper extremity PICC line with tip in the distal SVC. Otherwise stable chest without active disease. Electronically Signed   By: Titus Dubin M.D.   On: 03/15/2018 15:53   Dg Hip Unilat With Pelvis 2-3 Views Left  Result Date: 03/15/2018 CLINICAL DATA:  LEFT total hip arthroplasty revision 02/17/2018. Patient presents now with acute onset of LEFT hip pain and erythema and drainage at the incision site. Patient began antibiotic therapy 2 days ago via PICC. EXAM: DG HIP (WITH OR WITHOUT PELVIS) 2-3V LEFT COMPARISON:  02/15/2018. FINDINGS: LATERAL images are  underexposed due to cross-table lateral technique. Anatomic alignment post LEFT hip arthroplasty. Subacute fracture involving the proximal femoral metaphysis with normal healing (at the site of the periprosthetic lucency prior to the arthroplasty revision). Gas in the subcutaneous tissues overlying the LEFT hip and possible gas bubbles within the LEFT hip joint. IMPRESSION: 1. Gas in the subcutaneous tissues overlying the LEFT hip and possible small gas bubbles within the hip joint, highly suspicious for septic joint. 2. Subacute fracture involving the proximal femoral metaphysis with normal healing (at the site of the periprosthetic lucency prior to the arthroplasty revision). Electronically Signed   By: Evangeline Dakin M.D.   On: 03/15/2018 17:29    Procedures Procedures (including critical care time) CRITICAL CARE Performed by: Jillyn Ledger   Total critical care time: 60 minutes - septic joint   Critical care time was exclusive of separately billable procedures and treating other patients.  Critical care was necessary to treat or prevent imminent or life-threatening deterioration.  Critical care was time spent personally by me on the following activities: development of treatment plan with patient and/or surrogate as well as nursing, discussions with consultants, evaluation of patient's response to treatment, examination of patient, obtaining history from patient or surrogate, ordering and performing treatments and interventions, ordering and review  of laboratory studies, ordering and review of radiographic studies, pulse oximetry and re-evaluation of patient's condition.  Medications Ordered in ED Medications  piperacillin-tazobactam (ZOSYN) IVPB 3.375 g (has no administration in time range)  sodium chloride 0.9 % bolus 500 mL (has no administration in time range)  acetaminophen (TYLENOL) tablet 650 mg (650 mg Oral Given 03/15/18 1635)  sodium chloride 0.9 % bolus 1,000 mL (1,000 mLs  Intravenous New Bag/Given 03/15/18 1634)  morphine 4 MG/ML injection 4 mg (4 mg Intravenous Given 03/15/18 1633)     Initial Impression / Assessment and Plan / ED Course  I have reviewed the triage vital signs and the nursing notes.  Pertinent labs & imaging results that were available during my care of the patient were reviewed by me and considered in my medical decision making (see chart for details).     72 year old male who recently underwent a left total hip arthroplasty with revision by Dr. Wynelle Link of [facility] who presents the emergency department today with fever, purulent drainage from left hip incisional wound.  Skilled nursing facility reports that he has been having purulent drainage as well as fever over the last several days.  He had a PICC line placed on the 12th has been giving Vancomysin and Zosyn without improvement.  Patient is without fever, tachycardia, tachypnea, hypoxia or hypotension in the department.  No leukocytosis.  No lactic acidosis.  Blood cultures are pending.  Code sepsis was not called.  IVF given. Vanc and Zosyn were restarted.  Patient's x-ray with gas and subcutaneous tissue overlying the left hip consistent with septic joint. Will consult orthopedics.   6:04 PM Spoke with Dr. Wynelle Link. He is finishing a surgery and then will come see the patient.   I appreciate Dr. Wynelle Link for admitting the patient.   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

## 2018-03-15 NOTE — ED Notes (Signed)
Assisted Legrand [place], [place] with removing an island dressing from the left hip. The dressing was completely soiled with yellowish, cream drainage. Using clean technique, applied ABD pad x 2 over the surgical wound and secured with surgical tape. Patient tolerated it well.

## 2018-03-15 NOTE — H&P (Signed)
[name] is an 72 y.o. male.   Chief Complaint: left hip pain  HPI: [name] is a 72 y.o. male who recently underwent a left total hip arthroplasty with revision by Dr. Wynelle Link on 02/17/2018 and was discharged to [facility] skilled nursing facility on 02/23/18 who presents the emergency department today for possible infection of the left hip. Patient was reported to have drainage since his surgery. He was recently seen in the office by Dr. Wynelle Link and had a known post op hematoma. According to the SNF, on 7/10 the drainage became purulent and had a foul odor to it on 7/12.  The report that the patient began running a fever of 101.8 on 7/11 that continued into 7/12 (100.4).  They have been managing the fever with 1000 mg of Tylenol 4 times daily.  He was reported to have a PICC line placed on Friday, 7/12 and was started on vancomycin and Zosyn.  He was sent over today for concerns of a possible infected hip. Patient reports that he has not been ambulatory due to pain and denies any trauma since event.  He notes some chills and associated fever as well as some nausea but denies any emesis.  He denies any chest pain, shortness of breath, abdominal pain, diarrhea or urinary symptoms.  He notes that he has been receiving wound care for the left hip with frequent dressing changes including this morning. ED RN reported that the dressing was completely saturated upon arrival to the ED.      Past Medical History:  Diagnosis Date  . Anginal pain ([place]) 2006   evaluated by cardio  . Arthritis    knees,feet,shoulders,elbows.hands  . Constipation   . Dyspnea    with exertion   . Dysrhythmia    Atrial Flutter- 2006- corredted itself  . Family history of adverse reaction to anesthesia    mother- with novocaine went into shock  . GERD (gastroesophageal reflux disease)   . Headache   . Hemorrhoids   . History of blood transfusion   . Hypertension   . Insomnia   . Sleep apnea    cpap  .  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 APPENDECTOMY    . ARTERY BIOPSY Left 12/30/2017   Procedure: BIOPSY TEMPORAL ARTERY;  Surgeon: Fanny Skates, MD;  Location: [facility];  Service: General;  Laterality: Left;  . CHOLECYSTECTOMY    . COLONOSCOPY W/ POLYPECTOMY    . ESOPHAGOGASTRODUODENOSCOPY (EGD) WITH PROPOFOL  07/06/2012   Procedure: ESOPHAGOGASTRODUODENOSCOPY (EGD) WITH PROPOFOL;  Surgeon: Jeryl Columbia, MD;  Location: [facility];  Service: Endoscopy;  Laterality: N/A;  . ESOPHAGOSCOPY    . EYE SURGERY     left eye- muscle repair  . HERNIA REPAIR     ventral hernia  . JOINT REPLACEMENT     bilateral hips.  Right broke and had to be re placed again  . MASS EXCISION N/A 03/02/2015   Procedure: EXCISION ABDOMINAL WALL MASS;  Surgeon: Alphonsa Overall, MD;  Location: [facility];  Service: General;  Laterality: N/A;  . TOTAL HIP REVISION Left 02/17/2018   Procedure: Left total hip arthroplasty revision;  Surgeon: Gaynelle Arabian, MD;  Location: [facility];  Service: Orthopedics;  Laterality: Left;  Marland Kitchen VERTICAL BANDED GASTROPLASTY      Family History  Problem Relation Age of Onset  . Cancer Mother   . Alcohol abuse Father    Social  History:  reports that he quit smoking about 34 years ago. His smoking use included cigarettes. He started smoking about 51 years ago. He has a 20.00 pack-year smoking history. He has never used smokeless tobacco. He reports that he drinks alcohol. He reports that he does not use drugs.  Allergies:  Allergies  Allergen Reactions  . Bee Venom Swelling      Results for orders placed or performed during the hospital encounter of 03/15/18 (from the past 48 hour(s))  Comprehensive metabolic panel     Status: Abnormal   Collection Time: 03/15/18  3:33 PM  Result Value Ref Range   Sodium 142 135 - 145 mmol/L   Potassium 4.7 3.5 - 5.1 mmol/L   Chloride 105 98 - 111 mmol/L    Comment: Please note change in  reference range.   CO2 29 22 - 32 mmol/L   Glucose, Bld 111 (H) 70 - 99 mg/dL    Comment: Please note change in reference range.   BUN 20 8 - 23 mg/dL    Comment: Please note change in reference range.   Creatinine, Ser 1.13 0.61 - 1.24 mg/dL   Calcium 8.3 (L) 8.9 - 10.3 mg/dL   Total Protein 6.1 (L) 6.5 - 8.1 g/dL   Albumin 2.4 (L) 3.5 - 5.0 g/dL   AST 55 (H) 15 - 41 U/L   ALT 52 (H) 0 - 44 U/L    Comment: Please note change in reference range.   Alkaline Phosphatase 115 38 - 126 U/L   Total Bilirubin 0.7 0.3 - 1.2 mg/dL   GFR calc non Af Amer >60 >60 mL/min   GFR calc Af Amer >60 >60 mL/min    Comment: (NOTE) The eGFR has been calculated using the CKD EPI equation. This calculation has not been validated in all clinical situations. eGFR's persistently <60 mL/min signify possible Chronic Kidney Disease.    Anion gap 8 5 - 15    Comment: Performed at [facility], [place] [address], [place], [place] [address]  CBC with Differential     Status: Abnormal   Collection Time: 03/15/18  3:33 PM  Result Value Ref Range   WBC 5.2 4.0 - 10.5 K/uL   RBC 3.01 (L) 4.22 - 5.81 MIL/uL   Hemoglobin 9.2 (L) 13.0 - 17.0 g/dL   HCT 30.0 (L) 39.0 - 52.0 %   MCV 99.7 78.0 - 100.0 fL   MCH 30.6 26.0 - 34.0 pg   MCHC 30.7 30.0 - 36.0 g/dL   RDW 16.0 (H) 11.5 - 15.5 %   Platelets 209 150 - 400 K/uL   Neutrophils Relative % 65 %   Neutro Abs 3.4 1.7 - 7.7 K/uL   Lymphocytes Relative 19 %   Lymphs Abs 1.0 0.7 - 4.0 K/uL   Monocytes Relative 15 %   Monocytes Absolute 0.8 0.1 - 1.0 K/uL   Eosinophils Relative 1 %   Eosinophils Absolute 0.0 0.0 - 0.7 K/uL   Basophils Relative 0 %   Basophils Absolute 0.0 0.0 - 0.1 K/uL    Comment: Performed at [facility], [place] [address] [place],  [address]  I-Stat CG4 Lactic Acid, ED     Status: None   Collection Time: 03/15/18  3:39 PM  Result Value Ref Range   Lactic Acid, Venous 0.93 0.5 - 1.9 mmol/L   Urinalysis, Routine w reflex microscopic     Status: None   Collection Time: 03/15/18  4:07 PM  Result  Value Ref Range   Color, Urine YELLOW YELLOW   APPearance CLEAR CLEAR   Specific Gravity, Urine 1.006 1.005 - 1.030   pH 7.0 5.0 - 8.0   Glucose, UA NEGATIVE NEGATIVE mg/dL   Hgb urine dipstick NEGATIVE NEGATIVE   Bilirubin Urine NEGATIVE NEGATIVE   Ketones, ur NEGATIVE NEGATIVE mg/dL   Protein, ur NEGATIVE NEGATIVE mg/dL   Nitrite NEGATIVE NEGATIVE   Leukocytes, UA NEGATIVE NEGATIVE    Comment: Performed at [place] [address] [place], [place] [address]  I-Stat CG4 Lactic Acid, ED     Status: None   Collection Time: 03/15/18  5:24 PM  Result Value Ref Range   Lactic Acid, Venous 0.59 0.5 - 1.9 mmol/L   Dg Chest Portable 1 View  Result Date: 03/15/2018 CLINICAL DATA:  PICC line placement. EXAM: PORTABLE CHEST 1 VIEW COMPARISON:  Chest x-ray dated February 15, 2018. FINDINGS: Interval placement of a right upper extremity PICC line with the tip in the distal SVC. Stable cardiomegaly. Normal pulmonary vascularity. Unchanged chronic pleural thickening and subpleural scarring in the left mid lung. Unchanged chronic blunting of the left costophrenic angle. No consolidation, large pleural effusion, or pneumothorax. No acute osseous abnormality. IMPRESSION: 1. Right upper extremity PICC line with tip in the distal SVC. Otherwise stable chest without active disease. Electronically Signed   By: Titus Dubin M.D.   On: 03/15/2018 15:53   Dg Hip Unilat With Pelvis 2-3 Views Left  Result Date: 03/15/2018 CLINICAL DATA:  LEFT total hip arthroplasty revision 02/17/2018. Patient presents now with acute onset of LEFT hip pain and erythema and drainage at the incision site. Patient began antibiotic therapy 2 days ago via PICC. EXAM: DG HIP (WITH OR WITHOUT PELVIS) 2-3V LEFT COMPARISON:  02/15/2018. FINDINGS: LATERAL images are underexposed due to cross-table lateral technique.  Anatomic alignment post LEFT hip arthroplasty. Subacute fracture involving the proximal femoral metaphysis with normal healing (at the site of the periprosthetic lucency prior to the arthroplasty revision). Gas in the subcutaneous tissues overlying the LEFT hip and possible gas bubbles within the LEFT hip joint. IMPRESSION: 1. Gas in the subcutaneous tissues overlying the LEFT hip and possible small gas bubbles within the hip joint, highly suspicious for septic joint. 2. Subacute fracture involving the proximal femoral metaphysis with normal healing (at the site of the periprosthetic lucency prior to the arthroplasty revision). Electronically Signed   By: Evangeline Dakin M.D.   On: 03/15/2018 17:29    Review of Systems  Constitutional: Positive for chills and fever. Negative for diaphoresis, malaise/fatigue and weight loss.  HENT: Negative.   Eyes: Negative.   Respiratory: Negative.   Cardiovascular: Negative.   Gastrointestinal: Positive for nausea. Negative for abdominal pain, blood in stool, constipation, diarrhea, heartburn, melena and vomiting.  Genitourinary: Negative.   Musculoskeletal: Positive for joint pain and myalgias. Negative for back pain, falls and neck pain.  Skin: Negative.   Neurological: Negative.   Endo/Heme/Allergies: Negative.   Psychiatric/Behavioral: Negative.     Blood pressure (!) 143/69, pulse 79, temperature 99 F (37.2 C), temperature source Oral, resp. rate 18, height _0  (1.727 m), weight (!) 155.6 kg (343 lb), SpO2 95 %. Physical Exam  Constitutional: He is oriented to person, place, and time. He appears well-developed. No distress.  Morbidly obese  HENT:  Head: Normocephalic and atraumatic.  Right Ear: External ear normal.  Left Ear: External ear normal.  Nose: Nose normal.  Mouth/Throat: Oropharynx is clear and moist.  Eyes:  Conjunctivae and EOM are normal.  Neck: Normal range of motion. Neck supple.  Cardiovascular: Normal rate, regular rhythm,  normal heart sounds and intact distal pulses.  No murmur heard. Respiratory: Effort normal and breath sounds normal. No respiratory distress. He has no wheezes.  GI: Soft. Bowel sounds are normal. He exhibits no distension. There is no tenderness.  Neurological: He is alert and oriented to person, place, and time. He has normal strength. No sensory deficit.  Skin: He is not diaphoretic.  1 cm area of dehiscence of the incision on the distal third of the surgical site.  Purulent/yellow discharge noted from the wound. There is minimal erythema with some heat in the surrounding areas of the incision.  He is tender over this area with minimal palpation.   Psychiatric: He has a normal mood and affect. His behavior is normal.     Assessment/Plan S/P left total hip femoral revision Mr. Wedel is having drainage from the incision from his surgery a few weeks ago. At this time, Dr. Wynelle Link feels confident that we can manage this nonoperatively. WBC is normal and fever is low and responding to Tylenol. Will admit Mr. Penalver to the floor for continued evaluation, IV antibiotics, and PT. Will continue to monitor labs. If he does not appear to be responding to conservative treatment alone, possible need for I&D of the left hip on Wednesday.   Shakeem Stern LAUREN, PA-C 03/15/2018, 6:54 PM

## 2018-03-15 NOTE — Progress Notes (Signed)
A consult was received from an ED physician for vanc per pharmacy dosing.  The patient's profile has been reviewed for ht/wt/allergies/indication/available labs.   A one time order has been placed for vanc 2500mg .  Further antibiotics/pharmacy consults should be ordered by admitting physician if indicated.                       Thank you, Kara Mead 03/15/2018  5:50 PM

## 2018-03-15 NOTE — ED Notes (Signed)
Gave report to [place], Therapist, sports for room 1308.

## 2018-03-15 NOTE — ED Notes (Signed)
ED TO INPATIENT HANDOFF REPORT  Name/Age/Gender [name] 72 y.o. male  Code Status    Code Status Orders  (From admission, onward)        Start     Ordered   03/15/18 1910  Full code  Continuous     03/15/18 1915    Code Status History    Date Active Date Inactive Code Status Order ID Comments User Context   02/15/2018 1803 02/23/2018 2255 Full Code [id number]  Jani Gravel, MD ED   03/17/2012 1321 03/20/2012 1826 Full Code [id number]  Cr[place]field, Antony Haste, RN Inpatient      Home/SNF/Other Skilled nursing facility  Chief Complaint left hip pains, possible infection in hip area  Level of Care/Admitting Diagnosis ED Disposition    ED Disposition Condition [place]: [facility] [[id number]]  Level of Care: Med-Surg [16]  Diagnosis: Left hip postoperative wound infection [536144]  Admitting Physician: [place], Arthur  Attending Physician: Gaynelle Arabian [[id number]]  Estimated length of stay: past midnight tomorrow  Certification:: I certify this patient will need inpatient services for at least 2 midnights  PT Class (Do Not Modify): Inpatient [101]  PT Acc Code (Do Not Modify): Private [1]       Medical History Past Medical History:  Diagnosis Date  . Anginal pain ([place]) 2006   evaluated by cardio  . Arthritis    knees,feet,shoulders,elbows.hands  . Constipation   . Dyspnea    with exertion   . Dysrhythmia    Atrial Flutter- 2006- corredted itself  . Family history of adverse reaction to anesthesia    mother- with novocaine went into shock  . GERD (gastroesophageal reflux disease)   . Headache   . Hemorrhoids   . History of blood transfusion   . Hypertension   . Insomnia   . Sleep apnea    cpap  . 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 Bee Venom Swelling    IV Location/Drains/Wounds Patient Lines/Drains/Airways Status   Active Line/Drains/Airways    Name:    Placement date:   Placement time:   Site:   Days:   Incision 03/17/12 Hip Right   03/17/12    1028     2189   Incision (Closed) 03/02/15 Abdomen Right   03/02/15    0816     1109   Incision (Closed) 12/30/17 Face Other (Comment)   12/30/17    1446     75   Incision (Closed) 02/17/18 Hip Left   02/17/18    1839     26          Labs/Imaging Results for orders placed or performed during the hospital encounter of 03/15/18 (from the past 48 hour(s))  Comprehensive metabolic panel     Status: Abnormal   Collection Time: 03/15/18  3:33 PM  Result Value Ref Range   Sodium 142 135 - 145 mmol/L   Potassium 4.7 3.5 - 5.1 mmol/L   Chloride 105 98 - 111 mmol/L    Comment: Please note change in reference range.   CO2 29 22 - 32 mmol/L   Glucose, Bld 111 (H) 70 - 99 mg/dL    Comment: Please note change in reference range.   BUN 20 8 - 23 mg/dL    Comment: Please note change in reference range.   Creatinine, Ser 1.13 0.61 - 1.24 mg/dL   Calcium 8.3 (L) 8.9 -  10.3 mg/dL   Total Protein 6.1 (L) 6.5 - 8.1 g/dL   Albumin 2.4 (L) 3.5 - 5.0 g/dL   AST 55 (H) 15 - 41 U/L   ALT 52 (H) 0 - 44 U/L    Comment: Please note change in reference range.   Alkaline Phosphatase 115 38 - 126 U/L   Total Bilirubin 0.7 0.3 - 1.2 mg/dL   GFR calc non Af Amer >60 >60 mL/min   GFR calc Af Amer >60 >60 mL/min    Comment: (NOTE) The eGFR has been calculated using the CKD EPI equation. This calculation has not been validated in all clinical situations. eGFR's persistently <60 mL/min signify possible Chronic Kidney Disease.    Anion gap 8 5 - 15    Comment: Performed at [facility], [place] [address], [place], [place] [address]  CBC with Differential     Status: Abnormal   Collection Time: 03/15/18  3:33 PM  Result Value Ref Range   WBC 5.2 4.0 - 10.5 K/uL   RBC 3.01 (L) 4.22 - 5.81 MIL/uL   Hemoglobin 9.2 (L) 13.0 - 17.0 g/dL   HCT 30.0 (L) 39.0 - 52.0 %   MCV 99.7 78.0 - 100.0 fL   MCH  30.6 26.0 - 34.0 pg   MCHC 30.7 30.0 - 36.0 g/dL   RDW 16.0 (H) 11.5 - 15.5 %   Platelets 209 150 - 400 K/uL   Neutrophils Relative % 65 %   Neutro Abs 3.4 1.7 - 7.7 K/uL   Lymphocytes Relative 19 %   Lymphs Abs 1.0 0.7 - 4.0 K/uL   Monocytes Relative 15 %   Monocytes Absolute 0.8 0.1 - 1.0 K/uL   Eosinophils Relative 1 %   Eosinophils Absolute 0.0 0.0 - 0.7 K/uL   Basophils Relative 0 %   Basophils Absolute 0.0 0.0 - 0.1 K/uL    Comment: Performed at [facility], [place] [address] [place], [place] [address]  I-Stat CG4 Lactic Acid, ED     Status: None   Collection Time: 03/15/18  3:39 PM  Result Value Ref Range   Lactic Acid, Venous 0.93 0.5 - 1.9 mmol/L  Urinalysis, Routine w reflex microscopic     Status: None   Collection Time: 03/15/18  4:07 PM  Result Value Ref Range   Color, Urine YELLOW YELLOW   APPearance CLEAR CLEAR   Specific Gravity, Urine 1.006 1.005 - 1.030   pH 7.0 5.0 - 8.0   Glucose, UA NEGATIVE NEGATIVE mg/dL   Hgb urine dipstick NEGATIVE NEGATIVE   Bilirubin Urine NEGATIVE NEGATIVE   Ketones, ur NEGATIVE NEGATIVE mg/dL   Protein, ur NEGATIVE NEGATIVE mg/dL   Nitrite NEGATIVE NEGATIVE   Leukocytes, UA NEGATIVE NEGATIVE    Comment: Performed at [facility], [place] [address], [place], [place] [address]  I-Stat CG4 Lactic Acid, ED     Status: None   Collection Time: 03/15/18  5:24 PM  Result Value Ref Range   Lactic Acid, Venous 0.59 0.5 - 1.9 mmol/L   Dg Chest Portable 1 View  Result Date: 03/15/2018 CLINICAL DATA:  PICC line placement. EXAM: PORTABLE CHEST 1 VIEW COMPARISON:  Chest x-ray dated February 15, 2018. FINDINGS: Interval placement of a right upper extremity PICC line with the tip in the distal SVC. Stable cardiomegaly. Normal pulmonary vascularity. Unchanged chronic pleural thickening and subpleural scarring in the left mid lung. Unchanged chronic blunting of the left costophrenic angle. No consolidation, large  pleural  effusion, or pneumothorax. No acute osseous abnormality. IMPRESSION: 1. Right upper extremity PICC line with tip in the distal SVC. Otherwise stable chest without active disease. Electronically Signed   By: Titus Dubin M.D.   On: 03/15/2018 15:53   Dg Hip Unilat With Pelvis 2-3 Views Left  Result Date: 03/15/2018 CLINICAL DATA:  LEFT total hip arthroplasty revision 02/17/2018. Patient presents now with acute onset of LEFT hip pain and erythema and drainage at the incision site. Patient began antibiotic therapy 2 days ago via PICC. EXAM: DG HIP (WITH OR WITHOUT PELVIS) 2-3V LEFT COMPARISON:  02/15/2018. FINDINGS: LATERAL images are underexposed due to cross-table lateral technique. Anatomic alignment post LEFT hip arthroplasty. Subacute fracture involving the proximal femoral metaphysis with normal healing (at the site of the periprosthetic lucency prior to the arthroplasty revision). Gas in the subcutaneous tissues overlying the LEFT hip and possible gas bubbles within the LEFT hip joint. IMPRESSION: 1. Gas in the subcutaneous tissues overlying the LEFT hip and possible small gas bubbles within the hip joint, highly suspicious for septic joint. 2. Subacute fracture involving the proximal femoral metaphysis with normal healing (at the site of the periprosthetic lucency prior to the arthroplasty revision). Electronically Signed   By: Evangeline Dakin M.D.   On: 03/15/2018 17:29    Pending Labs Unresulted Labs (From admission, onward)   Start     Ordered   03/16/18 0500  Comprehensive metabolic panel  Tomorrow morning,   R     03/15/18 1915   03/16/18 0500  CBC  Tomorrow morning,   R     03/15/18 1915   03/16/18 0500  Creatinine, serum  Daily,   R     03/15/18 1937   03/15/18 1542  Culture, blood (routine x 2)  BLOOD CULTURE X 2,   STAT     03/15/18 1541      Vitals/Pain Today's Vitals   03/15/18 1735 03/15/18 1830 03/15/18 1900 03/15/18 2008  BP:  (!) 143/69 (!) 159/71   Pulse:  79  85   Resp:  18 18   Temp:      TempSrc:      SpO2:  95% 97%   Weight:      Height:      PainSc: 6    5     Isolation Precautions No active isolations  Medications Medications  vancomycin (VANCOCIN) 2,500 mg in sodium chloride 0.9 % 500 mL IVPB (2,500 mg Intravenous New Bag/Given 03/15/18 1849)  sodium chloride flush (NS) 0.9 % injection 3 mL (has no administration in time range)  sodium chloride flush (NS) 0.9 % injection 3 mL (has no administration in time range)  0.9 %  sodium chloride infusion (has no administration in time range)  0.9 %  sodium chloride infusion ( Intravenous New Bag/Given 03/15/18 2011)  acetaminophen (TYLENOL) tablet 650 mg (has no administration in time range)    Or  acetaminophen (TYLENOL) suppository 650 mg (has no administration in time range)  docusate sodium (COLACE) capsule 100 mg (has no administration in time range)  polyethylene glycol (MIRALAX / GLYCOLAX) packet 17 g (has no administration in time range)  bisacodyl (DULCOLAX) suppository 10 mg (has no administration in time range)  sodium phosphate (FLEET) 7-19 GM/118ML enema 1 enema (has no administration in time range)  ondansetron (ZOFRAN) tablet 4 mg (has no administration in time range)    Or  ondansetron (ZOFRAN) injection 4 mg (has no administration in time range)  aspirin EC tablet 81  mg (has no administration in time range)  vancomycin (VANCOCIN) 1,750 mg in sodium chloride 0.9 % 500 mL IVPB (has no administration in time range)  piperacillin-tazobactam (ZOSYN) IVPB 3.375 g (has no administration in time range)  acetaminophen (TYLENOL) tablet 650 mg (650 mg Oral Given 03/15/18 1635)  sodium chloride 0.9 % bolus 1,000 mL (0 mLs Intravenous Stopped 03/15/18 1821)  morphine 4 MG/ML injection 4 mg (4 mg Intravenous Given 03/15/18 1633)  piperacillin-tazobactam (ZOSYN) IVPB 3.375 g (0 g Intravenous Stopped 03/15/18 1912)  sodium chloride 0.9 % bolus 500 mL (0 mLs Intravenous Stopped 03/15/18 2000)     Mobility non-ambulatory

## 2018-03-16 LAB — CBC
HCT: 33.7 % — ABNORMAL LOW (ref 39.0–52.0)
Hemoglobin: 10.3 g/dL — ABNORMAL LOW (ref 13.0–17.0)
MCH: 30.7 pg (ref 26.0–34.0)
MCHC: 30.6 g/dL (ref 30.0–36.0)
MCV: 100.6 fL — ABNORMAL HIGH (ref 78.0–100.0)
Platelets: 215 10*3/uL (ref 150–400)
RBC: 3.35 MIL/uL — ABNORMAL LOW (ref 4.22–5.81)
RDW: 16 % — ABNORMAL HIGH (ref 11.5–15.5)
WBC: 5.7 10*3/uL (ref 4.0–10.5)

## 2018-03-16 LAB — COMPREHENSIVE METABOLIC PANEL
ALT: 55 U/L — ABNORMAL HIGH (ref 0–44)
AST: 56 U/L — ABNORMAL HIGH (ref 15–41)
Albumin: 2.3 g/dL — ABNORMAL LOW (ref 3.5–5.0)
Alkaline Phosphatase: 115 U/L (ref 38–126)
Anion gap: 7 (ref 5–15)
BUN: 15 mg/dL (ref 8–23)
CO2: 28 mmol/L (ref 22–32)
Calcium: 8.4 mg/dL — ABNORMAL LOW (ref 8.9–10.3)
Chloride: 106 mmol/L (ref 98–111)
Creatinine, Ser: 1 mg/dL (ref 0.61–1.24)
GFR calc Af Amer: >60 mL/min (ref >60)
GFR calc non Af Amer: >60 mL/min (ref >60)
Glucose, Bld: 75 mg/dL (ref 70–99)
Potassium: 4.1 mmol/L (ref 3.5–5.1)
Sodium: 141 mmol/L (ref 135–145)
Total Bilirubin: 0.7 mg/dL (ref 0.3–1.2)
Total Protein: 5.9 g/dL — ABNORMAL LOW (ref 6.5–8.1)

## 2018-03-16 MED ORDER — OXYCODONE HCL 5 MG PO TABS
5.0000 mg | ORAL_TABLET | Freq: Four times a day (QID) | ORAL | Status: DC | PRN
  Administered 2018-03-16 – 2018-03-22 (×8): 5 mg via ORAL
  Filled 2018-03-16 (×8): qty 1

## 2018-03-16 MED ORDER — PREDNISONE 5 MG PO TABS
10.0000 mg | ORAL_TABLET | Freq: Two times a day (BID) | ORAL | Status: DC
  Administered 2018-03-16 – 2018-03-18 (×3): 10 mg via ORAL
  Filled 2018-03-16 (×4): qty 2

## 2018-03-16 NOTE — Evaluation (Addendum)
Physical Therapy Evaluation Patient Details Name: [name] MRN: [id number] DOB: October 28, 1945 Today's Date: 03/16/2018   History of Present Illness  72 yo male admitted with L hip post op wound infection from SNF. 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     Clinical Impression  On eval, pt required Min assist +2 for mobility. He walked ~5 feet with a RW. Pt presents with general weakness, decreased activity tolerance, and impaired gait and balance. L hip incision site is still draining quite a bit. Discussed d/c plan-pt is unsure. He reported he has not had a good experience at [facility] rehab. Will follow and progress activity as tolerated. At this time, will recommend continued rehab at SNF if pt is agreeable. If pt chooses to return home, he will need HHPT and possibly a home health aide.     Follow Up Recommendations SNF(depending on progress/safest d/c plan)    Equipment Recommendations  None recommended by PT    Recommendations for Other Services       Precautions / Restrictions Precautions Precautions: Posterior Hip;Fall Restrictions Weight Bearing Restrictions: No LLE Weight Bearing: Weight bearing as tolerated      Mobility  Bed Mobility Overal bed mobility: Needs Assistance Bed Mobility: Supine to Sit     Supine to sit: Min assist;+2 for safety/equipment;HOB elevated     General bed mobility comments: Assist for L LE. Increased time. Pt relied on bedrail. Cues for safety, technique. Sat EOB for a few minutes before attempting standing  Transfers Overall transfer level: Needs assistance Equipment used: Rolling walker (2 wheeled) Transfers: Sit to/from Stand Sit to Stand: Min assist;+2 physical assistance;+2 safety/equipment;From elevated surface         General transfer comment: Assist to rise, stabilize, control descent. VCs safety, technique, hand/LE placement.   Ambulation/Gait Ambulation/Gait assistance: Min assist;+2 physical  assistance;+2 safety/equipment Gait Distance (Feet): 5 Feet Assistive device: Rolling walker (2 wheeled) Gait Pattern/deviations: Step-to pattern;Trunk flexed     General Gait Details: VCs safety, technique, sequence. Slow gait speed. Followed closely with recliner. Pt fatigues easily.   Stairs            Wheelchair Mobility    Modified Rankin (Stroke Patients Only)       Balance Overall balance assessment: Needs assistance         Standing balance support: Bilateral upper extremity supported Standing balance-[name]: Poor                               Pertinent Vitals/Pain Pain Assessment: 0-10 Pain Score: 5  Pain Location: L hip with activity Pain Descriptors / Indicators: Discomfort;Aching Pain Intervention(s): Monitored during session;Repositioned    Home Living Family/patient expects to be discharged to:: Private residence Living Arrangements: Alone   Type of Home: House Home Access: Ramped entrance     Home Layout: One level Home Equipment: Wheelchair - [employer] - 2 wheels;Crutches;Bedside commode;Hospital bed;Other (comment) Additional Comments: daughter lives across the street; works but flexible schedule.  Has been sponge bathing    Prior Function Level of Independence: Needs assistance   Gait / Transfers Assistance Needed: primarily transfers, per pt (prior to surgery in  01/2018)           Hand Dominance        Extremity/Trunk Assessment   Upper Extremity Assessment Upper Extremity Assessment: Generalized weakness    Lower Extremity Assessment Lower Extremity Assessment: Generalized weakness  Cervical / Trunk Assessment Cervical / Trunk Assessment: Kyphotic  Communication   Communication: No difficulties  Cognition Arousal/Alertness: Awake/alert Behavior During Therapy: WFL for tasks assessed/performed Overall Cognitive Status: Within Functional Limits for tasks assessed He will likely require  encouragement to progress activity.                                         General Comments      Exercises     Assessment/Plan    PT Assessment Patient needs continued PT services  PT Problem List Decreased strength;Decreased balance;Decreased mobility;Decreased range of motion;Decreased activity tolerance;Pain;Decreased knowledge of use of DME;Decreased knowledge of precautions       PT Treatment Interventions DME instruction;Gait training;Functional mobility training;Therapeutic activities;Balance training;Patient/family education;Therapeutic exercise;Stair training    PT Goals (Current goals can be found in the Care Plan section)  Acute Rehab PT Goals Patient Stated Goal: get back walking PT Goal Formulation: With patient Time For Goal Achievement: 03/30/18 Potential to Achieve Goals: Fair    Frequency Min 5X/week(pt is unsure of d/c plan-HHvsSNF)   Barriers to discharge        Co-evaluation               AM-PAC PT "6 Clicks" Daily Activity  Outcome Measure Difficulty turning over in bed (including adjusting bedclothes, sheets and blankets)?: Unable Difficulty moving from lying on back to sitting on the side of the bed? : Unable Difficulty sitting down on and standing up from a chair with arms (e.g., wheelchair, bedside commode, etc,.)?: Unable Help needed moving to and from a bed to chair (including a wheelchair)?: A Lot Help needed walking in hospital room?: A Lot Help needed climbing 3-5 steps with a railing? : Total 6 Click Score: 8    End of Session Equipment Utilized During Treatment: Gait belt Activity Tolerance: Patient tolerated treatment well Patient left: in chair;with call bell/phone within reach;with chair alarm set   PT Visit Diagnosis: Muscle weakness (generalized) (M62.81);Difficulty in walking, not elsewhere classified (R26.2);Pain;History of falling (Z91.81);Other abnormalities of gait and mobility (R26.89) Pain -  Right/Left: Left Pain - part of body: Hip    Time: 9244-6286 PT Time Calculation (min) (ACUTE ONLY): 13 min   Charges:   PT Evaluation $PT Eval Moderate Complexity: 1 Mod     PT G Codes:          Weston Anna, MPT Pager: [phone]

## 2018-03-16 NOTE — Progress Notes (Signed)
Subjective: He states he feels better today. He is getting ready to walk with PT  Objective: Vital signs in last 24 hours: Temp:  [97.6 F (36.4 C)-99 F (37.2 C)] 97.6 F (36.4 C) (07/16 1000) Pulse Rate:  [79-103] 103 (07/16 1000) Resp:  [14-18] 18 (07/16 1000) BP: (143-178)/(65-79) 144/67 (07/16 1000) SpO2:  [95 %-100 %] 100 % (07/16 1000) Weight:  [155.6 kg (343 lb)] 155.6 kg (343 lb) (07/15 1502)  Intake/Output from previous day: 07/15 0701 - 07/16 0700 In: 2938.1 [P.O.:260; I.V.:792.5; IV Piggyback:1885.6] Out: 3225 [Urine:3225] Intake/Output this shift: Total I/O In: 240 [P.O.:240] Out: 1200 [Urine:1200]  Recent Labs    03/15/18 1533 03/16/18 0559  HGB 9.2* 10.3*   Recent Labs    03/15/18 1533 03/16/18 0559  WBC 5.2 5.7  RBC 3.01* 3.35*  HCT 30.0* 33.7*  PLT 209 215   Recent Labs    03/15/18 1533 03/16/18 0559  NA 142 141  K 4.7 4.1  CL 105 106  CO2 29 28  BUN 20 15  CREATININE 1.13 1.00  GLUCOSE 111* 75  CALCIUM 8.3* 8.4*   No results for input(s): LABPT, INR in the last 72 hours.  Neurologically intact Dressing with mild drainage   Assessment/Plan: Doing better today. Will make decision tomorrow regarding possible surgery based on how the wound looks   Gaynelle Arabian 03/16/2018, 11:08 AM

## 2018-03-16 NOTE — Progress Notes (Signed)
Physical Therapy Treatment Patient Details Name: [name] MRN: [id number] DOB: [birth year] Today's Date: 03/16/2018    History of Present Illness 72 yo male admitted with L hip post op wound infection from SNF. 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     PT Comments    Called by nursing to help get pt out of recliner and back to bed. Nursing attempted to assist pt but they were unsuccessful. Multiple attempts and max encouragement for pt to put forth full effort to rise from recliner. Once standing, pt was able to stand and take a few steps to get back to bed. Continue to recommend SNF to improve strength, safety, and overall functional mobility.     Follow Up Recommendations  SNF     Equipment Recommendations  None recommended by PT    Recommendations for Other Services       Precautions / Restrictions Precautions Precautions: Posterior Hip;Fall Restrictions Weight Bearing Restrictions: No LLE Weight Bearing: Weight bearing as tolerated    Mobility  Bed Mobility Overal bed mobility: Needs Assistance Bed Mobility: Sit to Supine      Sit to supine: Mod assist   General bed mobility comments: Assist for bil LEs onto bed. Increased time. Pt relies on bedrails.   Transfers Overall transfer level: Needs assistance Equipment used: Rolling walker (2 wheeled) Transfers: Sit to/from Stand Sit to Stand: Max assist;+2 physical assistance;+2 safety/equipment Stand pivot transfers: Min assist;+2 physical assistance;+2 safety/equipment       General transfer comment: Pt had difficulty rising from low recliner. Multiple attempts and significant assist required. Increased time/effort. Cues for safety, hand/LE placement. Stand pivot, recliner to bed, with RW.   Ambulation/Gait Ambulation/Gait assistance: Min assist;+2 physical assistance;+2 safety/equipment Gait Distance (Feet): 5 Feet Assistive device: Rolling walker (2 wheeled) Gait  Pattern/deviations: Step-to pattern;Trunk flexed     General Gait Details: VCs safety, technique, sequence. Slow gait speed. Followed closely with recliner. Pt fatigues easily.    Stairs             Wheelchair Mobility    Modified Rankin (Stroke Patients Only)       Balance Overall balance assessment: Needs assistance         Standing balance support: Bilateral upper extremity supported Standing balance-[name]: Poor                              Cognition Arousal/Alertness: Awake/alert Behavior During Therapy: WFL for tasks assessed/performed Overall Cognitive Status: Within Functional Limits for tasks assessed                                        Exercises      General Comments        Pertinent Vitals/Pain Pain Assessment: 0-10 Pain Score: 5  Faces Pain Scale: Hurts whole lot Pain Location: L hip with activity Pain Descriptors / Indicators: Discomfort;Aching;Grimacing Pain Intervention(s): Limited activity within patient's tolerance;Repositioned    Home Living Family/patient expects to be discharged to:: Private residence Living Arrangements: Alone   Type of Home: House Home Access: Ramped entrance   Home Layout: One level Home Equipment: Wheelchair - [employer] - 2 wheels;Crutches;Bedside commode;Hospital bed;Other (comment) Additional Comments: daughter lives across the street; works but flexible schedule.  Has been sponge bathing    Prior Function Level of Independence: Needs  assistance  Gait / Transfers Assistance Needed: primarily transfers, per pt (prior to surgery in  01/2018)       PT Goals (current goals can now be found in the care plan section) Acute Rehab PT Goals Patient Stated Goal: get back walking PT Goal Formulation: With patient Time For Goal Achievement: 03/30/18 Potential to Achieve Goals: Fair Progress towards PT goals: Progressing toward goals    Frequency    Min 5X/week       PT Plan Current plan remains appropriate    Co-evaluation              AM-PAC PT "6 Clicks" Daily Activity  Outcome Measure  Difficulty turning over in bed (including adjusting bedclothes, sheets and blankets)?: Unable Difficulty moving from lying on back to sitting on the side of the bed? : Unable Difficulty sitting down on and standing up from a chair with arms (e.g., wheelchair, bedside commode, etc,.)?: Unable Help needed moving to and from a bed to chair (including a wheelchair)?: A Lot Help needed walking in hospital room?: A Lot Help needed climbing 3-5 steps with a railing? : Total 6 Click Score: 8    End of Session Equipment Utilized During Treatment: Gait belt Activity Tolerance: Patient limited by pain;Patient limited by fatigue Patient left: in bed;with call bell/phone within reach   PT Visit Diagnosis: Muscle weakness (generalized) (M62.81);Difficulty in walking, not elsewhere classified (R26.2);Pain;History of falling (Z91.81);Other abnormalities of gait and mobility (R26.89) Pain - Right/Left: Left Pain - part of body: Hip     Time: 4975-3005 PT Time Calculation (min) (ACUTE ONLY): 20 min  Charges:  $Therapeutic Activity: 8-22 mins                    G Codes:         Weston Anna, MPT Pager: [phone]

## 2018-03-17 ENCOUNTER — Encounter (HOSPITAL_COMMUNITY): Payer: Self-pay | Admitting: *Deleted

## 2018-03-17 ENCOUNTER — Inpatient Hospital Stay (HOSPITAL_COMMUNITY): Payer: Medicare Other | Admitting: Anesthesiology

## 2018-03-17 ENCOUNTER — Encounter (HOSPITAL_COMMUNITY)
Admission: EM | Disposition: A | Discharge status: Skilled Nursing Facility | Payer: Self-pay | Source: Home / Self Care | Attending: Orthopedic Surgery

## 2018-03-17 HISTORY — PX: INCISION AND DRAINAGE HIP: SHX1801

## 2018-03-17 LAB — CREATININE, SERUM
Creatinine, Ser: 1.09 mg/dL (ref 0.61–1.24)
GFR calc Af Amer: >60 mL/min (ref >60)
GFR calc non Af Amer: >60 mL/min (ref >60)

## 2018-03-17 LAB — SURGICAL PCR SCREEN
MRSA, PCR: NEGATIVE
Staphylococcus aureus: NEGATIVE

## 2018-03-17 LAB — CBC
HCT: 28.9 % — ABNORMAL LOW (ref 39.0–52.0)
Hemoglobin: 8.9 g/dL — ABNORMAL LOW (ref 13.0–17.0)
MCH: 30.7 pg (ref 26.0–34.0)
MCHC: 30.8 g/dL (ref 30.0–36.0)
MCV: 99.7 fL (ref 78.0–100.0)
Platelets: 215 10*3/uL (ref 150–400)
RBC: 2.9 MIL/uL — ABNORMAL LOW (ref 4.22–5.81)
RDW: 16 % — ABNORMAL HIGH (ref 11.5–15.5)
WBC: 6.2 10*3/uL (ref 4.0–10.5)

## 2018-03-17 LAB — BASIC METABOLIC PANEL
Anion gap: 8 (ref 5–15)
BUN: 15 mg/dL (ref 8–23)
CO2: 29 mmol/L (ref 22–32)
Calcium: 8.3 mg/dL — ABNORMAL LOW (ref 8.9–10.3)
Chloride: 106 mmol/L (ref 98–111)
Creatinine, Ser: 1.04 mg/dL (ref 0.61–1.24)
GFR calc Af Amer: >60 mL/min (ref >60)
GFR calc non Af Amer: >60 mL/min (ref >60)
Glucose, Bld: 82 mg/dL (ref 70–99)
Potassium: 3.9 mmol/L (ref 3.5–5.1)
Sodium: 143 mmol/L (ref 135–145)

## 2018-03-17 LAB — VANCOMYCIN, RANDOM: Vancomycin Rm: 14

## 2018-03-17 SURGERY — IRRIGATION AND DEBRIDEMENT HIP
Anesthesia: General | Site: Thigh | Laterality: Left

## 2018-03-17 MED ORDER — PHENYLEPHRINE 40 MCG/ML (10ML) SYRINGE FOR IV PUSH (FOR BLOOD PRESSURE SUPPORT)
PREFILLED_SYRINGE | INTRAVENOUS | Status: DC | PRN
  Administered 2018-03-17: 80 ug via INTRAVENOUS
  Administered 2018-03-17: 160 ug via INTRAVENOUS
  Administered 2018-03-17 (×4): 120 ug via INTRAVENOUS
  Administered 2018-03-17: 80 ug via INTRAVENOUS

## 2018-03-17 MED ORDER — LACTATED RINGERS IV SOLN
INTRAVENOUS | Status: DC | PRN
  Administered 2018-03-17: 16:00:00 via INTRAVENOUS

## 2018-03-17 MED ORDER — DEXAMETHASONE SODIUM PHOSPHATE 10 MG/ML IJ SOLN
INTRAMUSCULAR | Status: AC
  Filled 2018-03-17: qty 1

## 2018-03-17 MED ORDER — ONDANSETRON HCL 4 MG/2ML IJ SOLN
INTRAMUSCULAR | Status: AC
  Filled 2018-03-17: qty 2

## 2018-03-17 MED ORDER — SODIUM CHLORIDE 0.9 % IR SOLN
Status: DC | PRN
  Administered 2018-03-17: 1000 mL

## 2018-03-17 MED ORDER — PROPOFOL 10 MG/ML IV BOLUS
INTRAVENOUS | Status: DC | PRN
  Administered 2018-03-17: 50 mg via INTRAVENOUS
  Administered 2018-03-17: 200 mg via INTRAVENOUS

## 2018-03-17 MED ORDER — ALBUMIN HUMAN 5 % IV SOLN
INTRAVENOUS | Status: DC | PRN
  Administered 2018-03-17 (×2): via INTRAVENOUS

## 2018-03-17 MED ORDER — DOCUSATE SODIUM 100 MG PO CAPS
100.0000 mg | ORAL_CAPSULE | Freq: Two times a day (BID) | ORAL | Status: DC
  Administered 2018-03-17 – 2018-03-22 (×10): 100 mg via ORAL
  Filled 2018-03-17 (×11): qty 1

## 2018-03-17 MED ORDER — FENTANYL CITRATE (PF) 250 MCG/5ML IJ SOLN
INTRAMUSCULAR | Status: AC
  Filled 2018-03-17: qty 5

## 2018-03-17 MED ORDER — SUGAMMADEX SODIUM 200 MG/2ML IV SOLN
INTRAVENOUS | Status: DC | PRN
  Administered 2018-03-17: 300 mg via INTRAVENOUS

## 2018-03-17 MED ORDER — SODIUM CHLORIDE 0.9% FLUSH
10.0000 mL | INTRAVENOUS | Status: DC | PRN
  Administered 2018-03-23: 10 mL
  Filled 2018-03-17: qty 40

## 2018-03-17 MED ORDER — HYDROMORPHONE HCL 1 MG/ML IJ SOLN
INTRAMUSCULAR | Status: AC
  Filled 2018-03-17: qty 1

## 2018-03-17 MED ORDER — DEXAMETHASONE SODIUM PHOSPHATE 10 MG/ML IJ SOLN
INTRAMUSCULAR | Status: DC | PRN
  Administered 2018-03-17: 10 mg via INTRAVENOUS

## 2018-03-17 MED ORDER — ALBUMIN HUMAN 5 % IV SOLN
INTRAVENOUS | Status: AC
  Filled 2018-03-17: qty 250

## 2018-03-17 MED ORDER — SODIUM CHLORIDE 0.9 % IR SOLN
Status: DC | PRN
  Administered 2018-03-17: 9000 mL

## 2018-03-17 MED ORDER — PROMETHAZINE HCL 25 MG/ML IJ SOLN: 6.2500 mg | INTRAMUSCULAR | Status: DC | PRN

## 2018-03-17 MED ORDER — LIDOCAINE 2% (20 MG/ML) 5 ML SYRINGE
INTRAMUSCULAR | Status: AC
  Filled 2018-03-17: qty 5

## 2018-03-17 MED ORDER — ROCURONIUM BROMIDE 10 MG/ML (PF) SYRINGE
PREFILLED_SYRINGE | INTRAVENOUS | Status: AC
  Filled 2018-03-17: qty 10

## 2018-03-17 MED ORDER — PROPOFOL 10 MG/ML IV BOLUS
INTRAVENOUS | Status: AC
  Filled 2018-03-17: qty 20

## 2018-03-17 MED ORDER — ONDANSETRON HCL 4 MG/2ML IJ SOLN
INTRAMUSCULAR | Status: DC | PRN
  Administered 2018-03-17: 4 mg via INTRAVENOUS

## 2018-03-17 MED ORDER — ROCURONIUM BROMIDE 10 MG/ML (PF) SYRINGE
PREFILLED_SYRINGE | INTRAVENOUS | Status: DC | PRN
  Administered 2018-03-17: 60 mg via INTRAVENOUS

## 2018-03-17 MED ORDER — FENTANYL CITRATE (PF) 250 MCG/5ML IJ SOLN
INTRAMUSCULAR | Status: DC | PRN
  Administered 2018-03-17: 100 ug via INTRAVENOUS
  Administered 2018-03-17: 50 ug via INTRAVENOUS

## 2018-03-17 MED ORDER — MIDAZOLAM HCL 5 MG/5ML IJ SOLN
INTRAMUSCULAR | Status: DC | PRN
  Administered 2018-03-17: 2 mg via INTRAVENOUS

## 2018-03-17 MED ORDER — SUGAMMADEX SODIUM 500 MG/5ML IV SOLN
INTRAVENOUS | Status: AC
  Filled 2018-03-17: qty 5

## 2018-03-17 MED ORDER — PHENYLEPHRINE 40 MCG/ML (10ML) SYRINGE FOR IV PUSH (FOR BLOOD PRESSURE SUPPORT)
PREFILLED_SYRINGE | INTRAVENOUS | Status: AC
  Filled 2018-03-17: qty 20

## 2018-03-17 MED ORDER — MEPERIDINE HCL 50 MG/ML IJ SOLN: 6.2500 mg | INTRAMUSCULAR | Status: DC | PRN

## 2018-03-17 MED ORDER — MIDAZOLAM HCL 2 MG/2ML IJ SOLN
INTRAMUSCULAR | Status: AC
  Filled 2018-03-17: qty 2

## 2018-03-17 MED ORDER — HYDROMORPHONE HCL 1 MG/ML IJ SOLN
0.2500 mg | INTRAMUSCULAR | Status: DC | PRN
  Administered 2018-03-17 (×2): 0.5 mg via INTRAVENOUS

## 2018-03-17 MED ORDER — LIDOCAINE 2% (20 MG/ML) 5 ML SYRINGE
INTRAMUSCULAR | Status: DC | PRN
  Administered 2018-03-17: 75 mg via INTRAVENOUS

## 2018-03-17 MED ORDER — LACTATED RINGERS IV SOLN
INTRAVENOUS | Status: DC
  Administered 2018-03-17: 17:00:00 via INTRAVENOUS

## 2018-03-17 SURGICAL SUPPLY — 43 items
BAG ZIPLOCK 12X15 (MISCELLANEOUS) ×2 IMPLANT
COVER SURGICAL LIGHT HANDLE (MISCELLANEOUS) ×2 IMPLANT
DRAPE INCISE IOBAN 66X45 STRL (DRAPES) ×2 IMPLANT
DRAPE ORTHO SPLIT 77X108 STRL (DRAPES) ×2
DRAPE SURG ORHT 6 SPLT 77X108 (DRAPES) ×2 IMPLANT
DRAPE U-SHAPE 47X51 STRL (DRAPES) ×2 IMPLANT
DRSG EMULSION OIL 3X16 NADH (GAUZE/BANDAGES/DRESSINGS) ×2 IMPLANT
DRSG MEPILEX BORDER 4X12 (GAUZE/BANDAGES/DRESSINGS) ×2 IMPLANT
DRSG MEPILEX BORDER 4X4 (GAUZE/BANDAGES/DRESSINGS) ×2 IMPLANT
DRSG MEPILEX BORDER 4X8 (GAUZE/BANDAGES/DRESSINGS) ×2 IMPLANT
DURAPREP 26ML APPLICATOR (WOUND CARE) ×2 IMPLANT
ELECT REM PT RETURN 15FT ADLT (MISCELLANEOUS) ×2 IMPLANT
EVACUATOR 1/8 PVC DRAIN (DRAIN) ×4 IMPLANT
GAUZE SPONGE 4X4 12PLY STRL (GAUZE/BANDAGES/DRESSINGS) ×2 IMPLANT
GLOVE BIO SURGEON STRL SZ7.5 (GLOVE) ×2 IMPLANT
GLOVE BIO SURGEON STRL SZ8 (GLOVE) ×4 IMPLANT
GLOVE BIOGEL PI IND STRL 7.0 (GLOVE) ×2 IMPLANT
GLOVE BIOGEL PI IND STRL 8 (GLOVE) ×3 IMPLANT
GLOVE BIOGEL PI INDICATOR 7.0 (GLOVE) ×2
GLOVE BIOGEL PI INDICATOR 8 (GLOVE) ×3
GLOVE SURG SS PI 7.0 STRL IVOR (GLOVE) ×2 IMPLANT
GOWN STRL REUS W/TWL LRG LVL3 (GOWN DISPOSABLE) ×4 IMPLANT
GOWN STRL REUS W/TWL XL LVL3 (GOWN DISPOSABLE) ×2 IMPLANT
HANDPIECE INTERPULSE COAX TIP (DISPOSABLE) ×1
HOVERMATT SINGLE USE (MISCELLANEOUS) ×2 IMPLANT
KIT BASIN OR (CUSTOM PROCEDURE TRAY) ×2 IMPLANT
MANIFOLD NEPTUNE II (INSTRUMENTS) ×2 IMPLANT
PACK TOTAL JOINT (CUSTOM PROCEDURE TRAY) ×2 IMPLANT
PADDING CAST COTTON 6X4 STRL (CAST SUPPLIES) ×4 IMPLANT
POSITIONER SURGICAL ARM (MISCELLANEOUS) ×2 IMPLANT
SET HNDPC FAN SPRY TIP SCT (DISPOSABLE) ×1 IMPLANT
STAPLER VISISTAT 35W (STAPLE) ×4 IMPLANT
SUT ETHILON 3 0 PS 1 (SUTURE) ×2 IMPLANT
SUT PDS AB 1 CT1 27 (SUTURE) ×4 IMPLANT
SUT STRATAFIX 0 PDS 27 VIOLET (SUTURE) ×4
SUT VIC AB 1 CT1 27 (SUTURE) ×1
SUT VIC AB 1 CT1 27XBRD ANTBC (SUTURE) ×1 IMPLANT
SUT VIC AB 2-0 CT1 27 (SUTURE) ×4
SUT VIC AB 2-0 CT1 TAPERPNT 27 (SUTURE) ×4 IMPLANT
SUTURE STRATFX 0 PDS 27 VIOLET (SUTURE) ×2 IMPLANT
SWAB COLLECTION DEVICE MRSA (MISCELLANEOUS) ×2 IMPLANT
SWAB CULTURE ESWAB REG 1ML (MISCELLANEOUS) ×2 IMPLANT
TOWEL OR 17X26 10 PK STRL BLUE (TOWEL DISPOSABLE) ×4 IMPLANT

## 2018-03-17 NOTE — Anesthesia Postprocedure Evaluation (Signed)
Anesthesia Post Note  Patient: [name]  Procedure(s) Performed: IRRIGATION AND DEBRIDEMENT LEFT THIGH WOUND (Left Thigh)     Patient location during evaluation: PACU Anesthesia Type: General Level of consciousness: sedated and patient cooperative Pain management: pain level controlled Vital Signs Assessment: post-procedure vital signs reviewed and stable Respiratory status: spontaneous breathing Cardiovascular status: stable Anesthetic complications: no    Last Vitals:  Vitals:   03/17/18 1845 03/17/18 1900  BP: (!) 155/80 (!) 157/72  Pulse: 85 83  Resp: 14 16  Temp:    SpO2: 100% 100%    Last Pain:  Vitals:   03/17/18 1900  TempSrc:   PainSc: [place]

## 2018-03-17 NOTE — Clinical Social Work Note (Signed)
Clinical Social Work Assessment  Patient Details  Name: [name] MRN: [id number] Date of Birth: 1946/07/31  Date of referral:  03/17/18               Reason for consult:  Facility Placement                Permission sought to share information with:    Permission granted to share information::     Name::        Agency::     Relationship::     Contact Information:     Housing/Transportation Living arrangements for the past 2 months:  Single Family Home Source of Information:  Patient Patient Interpreter Needed:  None Criminal Activity/Legal Involvement Pertinent to Current Situation/Hospitalization:  No - Comment as needed Significant Relationships:  Adult Children, Friend Lives with:  Self Do you feel safe going back to the place where you live?  Yes Need for family participation in patient care:  No (Coment)  Care giving concerns:   Patient is requiring 2 plus assist. Patient is currently on Methadone.   Social Worker assessment / plan: CSW met with the patient at bedside, explain role and reason for visit- to assist with discharge to SNF for continued rehab. Patient reports he was recently at [facility] for rehab placement. While at the facility he reports he did not have a good experience there. Patient stayed at the facility for 20 days therefore is now in his [place] co-pay days which require the patient to 140-180 dollars a day at all SNF's. Patient reports,  "I cannot pay a co-pay that large."  Patient reports while at the facility he was also taking methadone at least four times a day.  Patient will continue to need methadone, which may also be a barrier at discharge.    Patient reports he plans to go home due to financial barriers.    CSW will continue to work with area SNF's to determine of SNF is an option.   Plan:  Unknown due to financial barriers.   Employment status:  Retired Nurse, adult PT Recommendations:   [place] / Referral to community resources:  [place]  Patient/Family's Response to care:  Agreeable and Responding well to care.   Patient/Family's Understanding of and Emotional Response to Diagnosis, Current Treatment, and Prognosis:  Patient has a good understanding of diagnosis and current treatment.   Emotional Assessment Appearance:  Appears stated age Attitude/Demeanor/Rapport:    Affect (typically observed):  Adaptable, Accepting Orientation:  Oriented to Self, Oriented to Place, Oriented to  Time, Oriented to Situation Alcohol / Substance use:  Not Applicable Psych involvement (Current and /or in the community):  No (Comment)  Discharge Needs  Concerns to be addressed:  Discharge Planning Concerns Readmission within the last 30 days:  No Current discharge risk:  Dependent with Mobility Barriers to Discharge:  Continued Medical Work up, Other(Patient unbale to pay copay/patient is currently on methodone for pain. )   Lia Hopping, LCSW 03/17/2018, 10:20 AM

## 2018-03-17 NOTE — Brief Op Note (Signed)
03/17/2018  6:41 PM  PATIENT:  [name]  72 y.o. male  PRE-OPERATIVE DIAGNOSIS:  left hip wound infection  POST-OPERATIVE DIAGNOSIS:  left hip wound infection  PROCEDURE:  Procedure(s): IRRIGATION AND DEBRIDEMENT LEFT THIGH WOUND (Left)  SURGEON:  Surgeon(s) and Role:    Gaynelle Arabian, MD - Primary  PHYSICIAN ASSISTANT:   ASSISTANTS: Molli Barrows, PA-C   ANESTHESIA:   general  EBL:  200 mL   BLOOD ADMINISTERED:none  DRAINS: (Medium) Hemovact drain(s) in the left hip with  Suction Open   LOCAL MEDICATIONS USED:  NONE  COUNTS:  YES  TOURNIQUET:  * No tourniquets in log *  DICTATION: .Other Dictation: Dictation Number [id number]  PLAN OF CARE: Admit to inpatient   PATIENT DISPOSITION:  PACU - hemodynamically stable.

## 2018-03-17 NOTE — Progress Notes (Signed)
Physical Therapy Treatment Patient Details Name: [name] MRN: [id number] DOB: 06-09-1946 Today's Date: 03/17/2018    History of Present Illness 72 yo male admitted with L hip post op wound infection from SNF. 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     PT Comments    Pt may possibly go down for I&D today. Noted significant drainage from L hip wound. Pt was able to stand and take a few lateral steps along the side of bed. He c/o fatigue and pain. He requested to return to bed. Continue to recommend SNF     Follow Up Recommendations  SNF     Equipment Recommendations  None recommended by PT    Recommendations for Other Services       Precautions / Restrictions Precautions Precautions: Posterior Hip;Fall Restrictions Weight Bearing Restrictions: No LLE Weight Bearing: Weight bearing as tolerated    Mobility  Bed Mobility Overal bed mobility: Needs Assistance Bed Mobility: Supine to Sit;Sit to Supine     Supine to sit: +2 for physical assistance;+2 for safety/equipment;Min assist;HOB elevated Sit to supine: Min assist;+2 for physical assistance;+2 for safety/equipment;HOB elevated   General bed mobility comments: Assist for bil LEs and trunk. Increased time. Pt relies on bedrails. Cues for safety.   Transfers Overall transfer level: Needs assistance Equipment used: Rolling walker (2 wheeled) Transfers: Sit to/from Stand Sit to Stand: Min assist;+2 physical assistance;+2 safety/equipment;From elevated surface         General transfer comment: highly elevated bed surface. VCs safety, technique, hand placement. Increased time. Noted significant draining from L hip wound once pt stood up.  Ambulation/Gait Ambulation/Gait assistance: Min assist;+2 physical assistance;+2 safety/equipment           General Gait Details: side steps along side of bed. Pt c/o fatigue.    Stairs             Wheelchair Mobility    Modified Rankin  (Stroke Patients Only)       Balance Overall balance assessment: Needs assistance         Standing balance support: Bilateral upper extremity supported Standing balance-[name]: Poor                              Cognition Arousal/Alertness: Awake/alert Behavior During Therapy: WFL for tasks assessed/performed Overall Cognitive Status: Within Functional Limits for tasks assessed                                        Exercises      General Comments        Pertinent Vitals/Pain Pain Assessment: Faces Faces Pain Scale: Hurts even more Pain Location: L hip, groin Pain Descriptors / Indicators: Discomfort;Aching;Grimacing Pain Intervention(s): Limited activity within patient's tolerance;Repositioned    Home Living                      Prior Function            PT Goals (current goals can now be found in the care plan section) Progress towards PT goals: Progressing toward goals    Frequency    Min 5X/week(in case pt decides to go home instead of SNF)      PT Plan Current plan remains appropriate    Co-evaluation   Reason for Co-Treatment: For patient/therapist  safety PT goals addressed during session: Mobility/safety with mobility        AM-PAC PT "6 Clicks" Daily Activity  Outcome Measure  Difficulty turning over in bed (including adjusting bedclothes, sheets and blankets)?: Unable Difficulty moving from lying on back to sitting on the side of the bed? : Unable Difficulty sitting down on and standing up from a chair with arms (e.g., wheelchair, bedside commode, etc,.)?: Unable Help needed moving to and from a bed to chair (including a wheelchair)?: A Lot Help needed walking in hospital room?: A Lot Help needed climbing 3-5 steps with a railing? : Total 6 Click Score: 8    End of Session   Activity Tolerance: Patient limited by fatigue;Patient limited by pain Patient left: in bed;with call bell/phone within  reach;with bed alarm set   PT Visit Diagnosis: Muscle weakness (generalized) (M62.81);Difficulty in walking, not elsewhere classified (R26.2);Pain;History of falling (Z91.81);Other abnormalities of gait and mobility (R26.89) Pain - Right/Left: Left Pain - part of body: Hip     Time: 3437-3578 PT Time Calculation (min) (ACUTE ONLY): 22 min  Charges:  $Gait Training: 8-22 mins                    G Codes:          [name], MPT Pager: [phone]

## 2018-03-17 NOTE — Progress Notes (Signed)
Pharmacy Antibiotic Note  [name] is a 72 y.o. male with recent L THA on 6/19 and was discharged on 6/25 to [facility].  Surgical wound became purulent with foul order on 7/12 and patient was started on vancomycin 1 gm IV q12h and zosyn 3.375 gm IV q6h with plan to treat for 7 days (thru 7/19).  He was transferred to [facility] on 7/15 for further workup and management of suspected hip infection.  Vancomycin and zosyn resumed on admission.  - Per [facility], last dose of vancomycin 1gm was given at 0800 on 7/15. - Pharmacist dosing initial vancomycin dose for patient may not have realized that he was getting vancomycin at the facility and gave him a 2500 mg vancomycin loading dose on admission, followed by 1750 mg IV daily.   Plan: - will check random vancomycin level at 7PM tonight to assess pt's drug level.  Pharmacy will f/u and adjust dose if needed.  _________________________________________   Height: 5\' 8"  (172.7 cm) Weight: (!) 343 lb (155.6 kg) IBW/kg (Calculated) : 68.4  Temp (24hrs), Avg:99.6 F (37.6 C), Min:99.2 F (37.3 C), Max:99.9 F (37.7 C)  Recent Labs  Lab 03/15/18 1533 03/15/18 1539 03/15/18 1724 03/16/18 0559 03/17/18 0440 03/17/18 0804  WBC 5.2  --   --  5.7  --  6.2  CREATININE 1.13  --   --  1.00 1.09 1.04  LATICACIDVEN  --  0.93 0.59  --   --   --     Estimated Creatinine Clearance: 95.2 mL/min (by C-G formula based on SCr of 1.04 mg/dL).    Allergies  Allergen Reactions  . Bee Venom Swelling    Thank you for allowing pharmacy to be a part of this patient's care.  Lynelle Doctor 03/17/2018 3:12 PM

## 2018-03-17 NOTE — Anesthesia Procedure Notes (Signed)
Procedure Name: Intubation Date/Time: 03/17/2018 5:12 PM Performed by: Lissa Morales, CRNA Pre-anesthesia Checklist: Patient identified, Emergency Drugs available, Suction available and Patient being monitored Patient Re-evaluated:Patient Re-evaluated prior to induction Oxygen Delivery Method: Circle system utilized Preoxygenation: Pre-oxygenation with 100% oxygen Induction Type: IV induction Ventilation: Mask ventilation without difficulty Laryngoscope Size: Mac and 4 Grade View: Grade II Tube type: Oral Tube size: 8.0 mm Number of attempts: 1 Airway Equipment and Method: Stylet and Oral airway Placement Confirmation: ETT inserted through vocal cords under direct vision,  positive ETCO2 and breath sounds checked- equal and bilateral Secured at: 22 cm Tube secured with: Tape Dental Injury: Teeth and Oropharynx as per pre-operative assessment

## 2018-03-17 NOTE — Anesthesia Preprocedure Evaluation (Signed)
Anesthesia Evaluation  Patient identified by MRN, date of birth, ID band Patient awake    Reviewed: Allergy & Precautions, NPO status , Patient's Chart, lab work & pertinent test results  Airway Mallampati: II  TM Distance: >3 FB Neck ROM: Full    Dental no notable dental hx.    Pulmonary shortness of breath, sleep apnea and Continuous Positive Airway Pressure Ventilation , former smoker,    Pulmonary exam normal breath sounds clear to auscultation       Cardiovascular hypertension, + angina + Peripheral Vascular Disease  Normal cardiovascular exam+ dysrhythmias  Rhythm:Regular Rate:Normal     Neuro/Psych  Headaches, negative neurological ROS  negative psych ROS   GI/Hepatic Neg liver ROS, GERD  ,  Endo/Other  Morbid obesity  Renal/GU negative Renal ROS  negative genitourinary   Musculoskeletal  (+) Arthritis ,   Abdominal   Peds negative pediatric ROS (+)  Hematology  (+) anemia ,   Anesthesia Other Findings   Reproductive/Obstetrics negative OB ROS                             Anesthesia Physical  Anesthesia Plan  ASA: III  Anesthesia Plan: General   Post-op Pain Management:    Induction: Intravenous  PONV Risk Score and Plan: 2 and Ondansetron, Dexamethasone and Treatment may vary due to age or medical condition  Airway Management Planned: Oral ETT  Additional Equipment:   Intra-op Plan:   Post-operative Plan: Extubation in OR  Informed Consent: I have reviewed the patients History and Physical, chart, labs and discussed the procedure including the risks, benefits and alternatives for the proposed anesthesia with the patient or authorized representative who has indicated his/her understanding and acceptance.   Dental advisory given  Plan Discussed with: CRNA  Anesthesia Plan Comments:         Anesthesia Quick Evaluation

## 2018-03-17 NOTE — Progress Notes (Signed)
Spoke with patient at bedside on multiple occasions. He is determined that he will d/c to home. He lives alone. He is agreeable to [facility] but does not have a caregiver to support him otherwise. He is having I&D today and will likely need wound care postop at home. Discussed with him the need for a teachable caregiver for dressing changes and need for assistance with ADL's as he currently is not ambulatory. He states his daughter has expressed she is unable to assist. He states he has not ambulated in several months. Currently he is not able to walk and requires 2+assist to move to the recliner. I have had frank discussion about needs at home and patient continues to say he will rehab himself. Will f/u after surgery to assess for improvement and to discuss support. [phone]

## 2018-03-17 NOTE — Evaluation (Signed)
Occupational Therapy Evaluation Patient Details Name: [name] MRN: [id number] DOB: [birth year] Today's Date: 03/17/2018    History of Present Illness 72 yo male admitted with L hip post op wound infection from SNF. 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 Scheduled for I & D 03/17/18   Clinical Impression   Pt was admitted for the above.  He reports that he wasn't getting OOB at SNF.  Pt needs cues for posterior THPs.  Will follow in acute setting with the goals listed below.  Recommend continued OT at SNF following d/c    Follow Up Recommendations  SNF    Equipment Recommendations  None recommended by OT    Recommendations for Other Services       Precautions / Restrictions Precautions Precautions: Posterior Hip;Fall Precaution Comments: check BP  ORTHOSTATIC Restrictions Weight Bearing Restrictions: No LLE Weight Bearing: Weight bearing as tolerated      Mobility Bed Mobility Overal bed mobility: Needs Assistance Bed Mobility: Supine to Sit;Sit to Supine     Supine to sit: +2 for physical assistance;+2 for safety/equipment;Min assist;HOB elevated Sit to supine: Min assist;+2 for physical assistance;+2 for safety/equipment;HOB elevated   General bed mobility comments: Assist for bil LEs and trunk. Increased time. Pt relies on bedrails. Cues for safety.   Transfers Overall transfer level: Needs assistance Equipment used: Rolling walker (2 wheeled) Transfers: Sit to/from Stand Sit to Stand: Min assist;+2 physical assistance;+2 safety/equipment;From elevated surface         General transfer comment: highly elevated bed surface. VCs safety, technique, hand placement. Increased time. Noted significant draining from L hip wound once pt stood up.    Balance Overall balance assessment: Needs assistance         Standing balance support: Bilateral upper extremity supported Standing balance-[name]: Poor                              ADL either performed or assessed with clinical judgement   ADL   Eating/Feeding: Independent   Grooming: Set up;Sitting   Upper Body Bathing: Set up;Sitting   Lower Body Bathing: Maximal assistance;+2 for safety/equipment;Bed level   Upper Body Dressing : Set up;Sitting   Lower Body Dressing: Total assistance;+2 for safety/equipment;Sit to/from stand                 General ADL Comments: without AE     Vision         Perception     Praxis      Pertinent Vitals/Pain Pain Assessment: Faces Faces Pain Scale: Hurts even more Pain Location: L hip, groin Pain Descriptors / Indicators: Discomfort;Aching;Grimacing Pain Intervention(s): Limited activity within patient's tolerance;Monitored during session;Premedicated before session;Repositioned     Hand Dominance     Extremity/Trunk Assessment Upper Extremity Assessment Upper Extremity Assessment: Overall WFL for tasks assessed           Communication Communication Communication: No difficulties   Cognition Arousal/Alertness: Awake/alert Behavior During Therapy: WFL for tasks assessed/performed Overall Cognitive Status: Within Functional Limits for tasks assessed                                 General Comments: needs reinforcement for THPS.  Unable to recall any without cues:  2/3 with cues   General Comments  pt for sx later, I & D    Exercises  Shoulder Instructions      Home Living Family/patient expects to be discharged to:: Unsure Living Arrangements: Alone                               Additional Comments: was at rehab prior to this admission. He reports he wasn't getting OOB      Prior Functioning/Environment                   OT Problem List: Decreased activity tolerance;Impaired balance (sitting and/or standing);Cardiopulmonary status limiting activity;Pain;Decreased knowledge of precautions;Decreased knowledge of use of DME or AE;Obesity       OT Treatment/Interventions: Self-care/ADL training;DME and/or AE instruction;Patient/family education;Balance training;Cognitive remediation/compensation    OT Goals(Current goals can be found in the care plan section) Acute Rehab OT Goals Patient Stated Goal: get back walking OT Goal Formulation: With patient Time For Goal Achievement: 03/31/18 Potential to Achieve Goals: Good ADL Goals Pt Will Perform Grooming: with min guard assist;standing Pt Will Perform Lower Body Bathing: with min assist;with adaptive equipment;sit to/from stand Pt Will Perform Lower Body Dressing: with min assist;sit to/from stand;with adaptive equipment Pt Will Transfer to Toilet: with min assist;ambulating;bedside commode Pt Will Perform Toileting - Clothing Manipulation and hygiene: with min assist;sit to/from stand Additional ADL Goal #1: pt will recall 3/3 posterior thps without cues  OT Frequency: Min 2X/week   Barriers to D/C:            Co-evaluation PT/OT/SLP Co-Evaluation/Treatment: Yes Reason for Co-Treatment: For patient/therapist safety PT goals addressed during session: Mobility/safety with mobility OT goals addressed during session: ADL's and self-care      AM-PAC PT "6 Clicks" Daily Activity     Outcome Measure Help from another person eating meals?: None Help from another person taking care of personal grooming?: A Little Help from another person toileting, which includes using toliet, bedpan, or urinal?: A Lot Help from another person bathing (including washing, rinsing, drying)?: A Lot Help from another person to put on and taking off regular upper body clothing?: A Little Help from another person to put on and taking off regular lower body clothing?: Total 6 Click Score: 15   End of Session    Activity Tolerance: Patient limited by fatigue;Patient limited by pain Patient left: in bed;with bed alarm set;with call bell/phone within reach  OT Visit Diagnosis: Unsteadiness on  feet (R26.81);Muscle weakness (generalized) (M62.81);Pain Pain - Right/Left: Left Pain - part of body: Hip                Time: 1610-9604 OT Time Calculation (min): 22 min Charges:  OT General Charges $OT Visit: 1 Visit OT Evaluation $OT Eval Low Complexity: 1 Low G-Codes:     [name], OTR/L [phone] 03/17/2018  [name] 03/17/2018, 9:48 AM

## 2018-03-17 NOTE — Progress Notes (Signed)
Pt. seen on first rounds, asked to use nasal mask this evening instead of FFM, obtained from dept. placed on circuit for pt., RN aware, told to notify if assistance needed.

## 2018-03-17 NOTE — Transfer of Care (Signed)
Immediate Anesthesia Transfer of Care Note  Patient: [name]  Procedure(s) Performed: IRRIGATION AND DEBRIDEMENT LEFT THIGH WOUND (Left Thigh)  Patient Location: PACU  Anesthesia Type:General  Level of Consciousness: awake, alert , oriented and patient cooperative  Airway & Oxygen Therapy: Patient Spontanous Breathing and Patient connected to face mask oxygen  Post-op Assessment: Report given to RN, Post -op Vital signs reviewed and stable and Patient moving all extremities X 4  Post vital signs: stable  Last Vitals:  Vitals Value Taken Time  BP 162/73 03/17/2018  6:38 PM  Temp    Pulse 85 03/17/2018  6:41 PM  Resp 13 03/17/2018  6:41 PM  SpO2 100 % 03/17/2018  6:41 PM  Vitals shown include unvalidated device data.  Last Pain:  Vitals:   03/17/18 1637  TempSrc: Oral  PainSc:       Patients Stated Pain Goal: 2 (61/44/31 5400)  Complications: No apparent anesthesia complications

## 2018-03-17 NOTE — Progress Notes (Signed)
Pharmacy Antibiotic Note  [name] is a 72 y.o. male with recent L THA on 6/19 and was discharged on 6/25 to [facility].  Surgical wound became purulent with foul order on 7/12 and patient was started on vancomycin 1 gm IV q12h and zosyn 3.375 gm IV q6h with plan to treat for 7 days (thru 7/19).  He was transferred to [facility] on 7/15 for further workup and management of suspected hip infection.  Vancomycin and zosyn resumed on admission.  - Per [facility], last dose of vancomycin 1gm was given at 0800 on 7/15. - Pharmacist dosing initial vancomycin dose for patient may not have realized that he was getting vancomycin at the facility and gave him a 2500 mg vancomycin loading dose on admission, followed by 1750 mg IV daily, received 1 dose 7/16 at 2100  Plan: Random Vanc level ordered for 19:00, drawn at 16: 09 = 14 mcg/ml (prior to patient taken to OR for I&D of L thigh wound Continue Vancomycin 1750mg  IV q24  Height: 5\' 8"  (172.7 cm) Weight: (!) 343 lb (155.6 kg) IBW/kg (Calculated) : 68.4  Temp (24hrs), Avg:99.3 F (37.4 C), Min:98.8 F (37.1 C), Max:99.9 F (37.7 C)  Recent Labs  Lab 03/15/18 1533 03/15/18 1539 03/15/18 1724 03/16/18 0559 03/17/18 0440 03/17/18 0804 03/17/18 1609  WBC 5.2  --   --  5.7  --  6.2  --   CREATININE 1.13  --   --  1.00 1.09 1.04  --   LATICACIDVEN  --  0.93 0.59  --   --   --   --   VANCORANDOM  --   --   --   --   --   --  14    Estimated Creatinine Clearance: 95.2 mL/min (by C-G formula based on SCr of 1.04 mg/dL).    Allergies  Allergen Reactions  . Bee Venom Swelling   Antimicrobials this admission: 7/12 vanc >> 7/12 Zosyn >>  Dose adjustments this admission:  Microbiology results: 7/15 BCx x2: ngtd 7/17 Surg PCR: neg/neg 7/17 Synovial Cx: sent  Thank you for allowing pharmacy to be a part of this patient's care.  Minda Ditto PharmD Pager [phone] 03/17/2018, 6:41 PM

## 2018-03-18 ENCOUNTER — Encounter (HOSPITAL_COMMUNITY): Payer: Self-pay | Admitting: Orthopedic Surgery

## 2018-03-18 LAB — BASIC METABOLIC PANEL
Anion gap: 5 (ref 5–15)
BUN: 16 mg/dL (ref 8–23)
CO2: 29 mmol/L (ref 22–32)
Calcium: 8.1 mg/dL — ABNORMAL LOW (ref 8.9–10.3)
Chloride: 107 mmol/L (ref 98–111)
Creatinine, Ser: 1.12 mg/dL (ref 0.61–1.24)
GFR calc Af Amer: >60 mL/min (ref >60)
GFR calc non Af Amer: >60 mL/min (ref >60)
Glucose, Bld: 218 mg/dL — ABNORMAL HIGH (ref 70–99)
Potassium: 4 mmol/L (ref 3.5–5.1)
Sodium: 141 mmol/L (ref 135–145)

## 2018-03-18 LAB — CBC
HCT: 25.5 % — ABNORMAL LOW (ref 39.0–52.0)
Hemoglobin: 7.7 g/dL — ABNORMAL LOW (ref 13.0–17.0)
MCH: 30.1 pg (ref 26.0–34.0)
MCHC: 30.2 g/dL (ref 30.0–36.0)
MCV: 99.6 fL (ref 78.0–100.0)
Platelets: 196 10*3/uL (ref 150–400)
RBC: 2.56 MIL/uL — ABNORMAL LOW (ref 4.22–5.81)
RDW: 15.7 % — ABNORMAL HIGH (ref 11.5–15.5)
WBC: 7.6 10*3/uL (ref 4.0–10.5)

## 2018-03-18 LAB — HEMOGLOBIN AND HEMATOCRIT, BLOOD
HCT: 27.5 % — ABNORMAL LOW (ref 39.0–52.0)
Hemoglobin: 8.6 g/dL — ABNORMAL LOW (ref 13.0–17.0)

## 2018-03-18 LAB — PREPARE RBC (CROSSMATCH)

## 2018-03-18 MED ORDER — ALUM & MAG HYDROXIDE-SIMETH 200-200-20 MG/5ML PO SUSP
15.0000 mL | ORAL | Status: DC | PRN
  Administered 2018-03-18 – 2018-03-22 (×5): 15 mL via ORAL
  Filled 2018-03-18 (×5): qty 30

## 2018-03-18 MED ORDER — ASPIRIN 325 MG PO TABS
325.0000 mg | ORAL_TABLET | Freq: Every day | ORAL | Status: DC
Start: 2018-03-18 — End: 2018-03-19
  Administered 2018-03-18: 325 mg via ORAL
  Filled 2018-03-18: qty 1

## 2018-03-18 MED ORDER — SODIUM CHLORIDE 0.9% IV SOLUTION: Freq: Once | INTRAVENOUS | Status: DC

## 2018-03-18 NOTE — Progress Notes (Signed)
   Subjective: 1 Day Post-Op Procedure(s) (LRB): IRRIGATION AND DEBRIDEMENT LEFT THIGH WOUND (Left) Patient reports pain as moderate.   Patient seen in rounds with Dr. Wynelle Link. Patient is well, and has had no acute complaints or problems other than discomfort in the left hip. No SOB or chest pain.  Plan is to go Skilled nursing facility after hospital stay.  Objective: Vital signs in last 24 hours: Temp:  [98.4 F (36.9 C)-99.1 F (37.3 C)] 98.5 F (36.9 C) (07/18 0607) Pulse Rate:  [73-97] 73 (07/18 0607) Resp:  [14-18] 18 (07/17 2243) BP: (134-167)/(66-84) 149/66 (07/18 0607) SpO2:  [90 %-100 %] 96 % (07/18 0607) Weight:  [155.6 kg (343 lb)] 155.6 kg (343 lb) (07/17 1637)  Intake/Output from previous day:  Intake/Output Summary (Last 24 hours) at 03/18/2018 0734 Last data filed at 03/18/2018 0600 Gross per 24 hour  Intake 2473.75 ml  Output 2085 ml  Net 388.75 ml     Labs: Recent Labs    03/15/18 1533 03/16/18 0559 03/17/18 0804 03/18/18 0434  HGB 9.2* 10.3* 8.9* 7.7*   Recent Labs    03/17/18 0804 03/18/18 0434  WBC 6.2 7.6  RBC 2.90* 2.56*  HCT 28.9* 25.5*  PLT 215 196   Recent Labs    03/17/18 0804 03/18/18 0434  NA 143 141  K 3.9 4.0  CL 106 107  CO2 29 29  BUN 15 16  CREATININE 1.04 1.12  GLUCOSE 82 218*  CALCIUM 8.3* 8.1*    EXAM General - Patient is Alert and Oriented Extremity - Neurologically intact Intact pulses distally Dorsiflexion/Plantar flexion intact Compartment soft Dressing - dressing C/D/I Motor Function - intact, moving foot and toes well on exam.    Past Medical History:  Diagnosis Date  . Anginal pain ([place]) 2006   evaluated by cardio  . Arthritis    knees,feet,shoulders,elbows.hands  . Constipation   . Dyspnea    with exertion   . Dysrhythmia    Atrial Flutter- 2006- corredted itself  . Family history of adverse reaction to anesthesia    mother- with novocaine went into shock  . GERD (gastroesophageal reflux  disease)   . Headache   . Hemorrhoids   . History of blood transfusion   . Hypertension   . Insomnia   . Sleep apnea    cpap  . 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   Weight as of this encounter: 155.6 kg (343 lb). Advance diet Up with therapy  DVT Prophylaxis - Aspirin Weight Bearing As Tolerated  D/C Knee Immobilizer Hip Precautions  Needs blood transfusion of 2 units. Therapy when done. Keep drain until Monday. Will need IV antibiotics post op and will need SNF placement.   Ardeen Jourdain, PA-C Orthopaedic Surgery 03/18/2018, 7:34 AM

## 2018-03-18 NOTE — Progress Notes (Signed)
Patient states that his prednisone was supposed to be tapered down as prescribed by his Rheumatologist ,Dr. Berna Bue. He is also missing his testosterone which was prescribed to him by Dr. Sloan Leiter.   Dr. Wynelle Link will be notified that his patient is requesting these medications.

## 2018-03-18 NOTE — Care Management Important Message (Signed)
Important Message  Patient Details  Name: [name] MRN: [id number] Date of Birth: February 24, 1946   Medicare Important Message Given:  Yes    Kerin Salen 03/18/2018, 11:02 AMImportant Message  Patient Details  Name: [name] MRN: [id number] Date of Birth: Dec 11, 1945   Medicare Important Message Given:  Yes    Kerin Salen 03/18/2018, 11:02 AM

## 2018-03-18 NOTE — Op Note (Signed)
NAME: MERCER, PEIFER MEDICAL RECORD [id number] ACCOUNT [id number] DATE OF BIRTH:1946/07/02 FACILITY: [facility] LOCATION: [facility] PHYSICIAN:Leialoha Hanna Zella Ball, MD  OPERATIVE REPORT  DATE OF PROCEDURE:  03/17/2018  PREOPERATIVE DIAGNOSIS:  Left hip wound infection.  POSTOPERATIVE DIAGNOSIS:  Left hip wound infection.  PROCEDURE:  Irrigation and debridement of left hip wound.  SURGEON:   Dione Plover. Harkirat Orozco, MD  ASSISTANT:   Molli Barrows, PA-C   ANESTHESIA:  General.  ESTIMATED BLOOD LOSS:.  DRAINS:  Hemovac x2.  COMPLICATIONS:  None.  CONDITION:  Stable to recovery.  BRIEF CLINICAL NOTE:  The patient is a 72 year old male who had a left total hip arthroplasty revision done approximately 3 weeks ago.  He had developed a postop wound drainage.  Unfortunately, it got worse over the past several days.  He was transferred  from the rehab facility back to the hospital.  He is taken to surgery today for irrigation and debridement of the wound.  DESCRIPTION OF  FINDINGS:  After successful administration of general anesthetic, the patient was placed in the right lateral decubitus position with the left side up and held with a hip positioner.  Left lower extremity was isolated from his perineum  with plastic drapes and prepped and draped in the usual sterile fashion.  Previous wound incision is cut with a 10 blade ellipsing out the old scar.  He has a 6-8 inch layer of subcutaneous tissue with a significant amount of fat necrosis present.  There  was purulent fluid that was sent for a Gram stain culture and sensitivity.  This went down to the level of the fascia.  There is a lot of inflamed tissue overlying the fascia and this was debrided back to more normal appearing tissue.  Again, there was  a lot of fat necrosis present.  I tried to debride as much of this as possible.  We then thoroughly irrigated the wound with 9 liters of saline using pulsatile lavage. It appeared there might have been small  communication with the joint, thus, we  thoroughly irrigated the joint out also.  I did not feel it was necessary to do a revision procedure, given the acuity of this.  After the irrigation was completed, we then closed the fascia layer with a running #1 Stratafix suture utilizing 2 of these  to oversew it.  Drain had been placed deep to the fascia lata.  Another drain was placed in the deep subcu and the subcu was closed with interrupted #1 PDS.  Subcutaneous layer was then closed with interrupted 2-0 Vicryl, which had a very good solid  closure.  Skin was then closed with staples.  The drains were then sewn in with interrupted 4-0 nylon.  The incision was cleaned and dried and a bulky sterile dressing applied.  He was then awakened and transported to recovery in stable condition.  Note that a surgical assistant was a medical necessity for this procedure.  This was a very complex wound requiring a complex debridement and complex closure.  Surgical assistant was necessary to facilitate this.  AN/NUANCE  D:03/17/2018 T:03/18/2018 JOB:[id number]

## 2018-03-18 NOTE — Progress Notes (Addendum)
Physical Therapy Treatment Patient Details Name: [name] MRN: [id number] DOB: 08/08/1946 Today's Date: 03/18/2018    History of Present Illness 72 yo male admitted with L hip post op wound infection from SNF. 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     PT Comments    Assisted patient with bed mobility and transfers. Patient c/o chair not being suitable to his size. Obtained a bariatric recliner and RW from central supply. Patient needed min assist to move LE's and elevate trunk while going from supine to EOB. Sit to stand required increased time and min +2 assist for safety. Upon standing patient became moderately dizzy. Orthostatic vitals BP 99/59, HR 125. Patient placed in recliner; stand to sit min assist with some lack of control in descent. Vitals in recliner: BP 131/73, HR 102. Patients daughter was present during therapy. Patient c/o 8/10 pain during therapy. RN notified of mobility status and pain level.   Follow Up Recommendations  SNF     Equipment Recommendations  None recommended by PT    Recommendations for Other Services       Precautions / Restrictions Precautions Precautions: Posterior Hip;Fall Precaution Comments: check BP  ORTHOSTATIC Restrictions Weight Bearing Restrictions: No LLE Weight Bearing: Weight bearing as tolerated Other Position/Activity Restrictions: WBAT    Mobility  Bed Mobility Overal bed mobility: Needs Assistance Bed Mobility: Supine to Sit     Supine to sit: +2 for physical assistance;+2 for safety/equipment;Min assist;HOB elevated     General bed mobility comments: Assist movement of bilateral LEs, HHA physical assist to evevate trunk  Transfers Overall transfer level: Needs assistance Equipment used: Rolling walker (2 wheeled)(bariatric RW)   Sit to Stand: Min assist;+2 physical assistance;+2 safety/equipment;From elevated surface         General transfer comment: elevated bed surface; must lean  significant distance forward to shift wt. to standing; patient became mod dizzy upon standing; soft BP  Ambulation/Gait Ambulation/Gait assistance: Min assist;+2 physical assistance;+2 safety/equipment Gait Distance (Feet): 1 foot with several steps to complete pivot Assistive device: Rolling walker (2 wheeled)(bariatric walker) Gait Pattern/deviations: Step-to pattern;Trunk flexed Gait velocity: decreased       Stairs             Wheelchair Mobility    Modified Rankin (Stroke Patients Only)       Balance                                            Cognition Arousal/Alertness: Awake/alert Behavior During Therapy: WFL for tasks assessed/performed Overall Cognitive Status: Within Functional Limits for tasks assessed                                        Exercises      General Comments        Pertinent Vitals/Pain Pain Assessment: 0-10 Pain Score: 8  Pain Location: Groin; L hip Pain Descriptors / Indicators: Discomfort;Aching;Grimacing Pain Intervention(s): Limited activity within patient's tolerance;Repositioned;Ice applied;Monitored during session    Home Living                      Prior Function            PT Goals (current goals can now be found in the  care plan section) Progress towards PT goals: Progressing toward goals    Frequency    Min 5X/week      PT Plan Current plan remains appropriate    Co-evaluation              AM-PAC PT "6 Clicks" Daily Activity  Outcome Measure  Difficulty turning over in bed (including adjusting bedclothes, sheets and blankets)?: Unable Difficulty moving from lying on back to sitting on the side of the bed? : Unable Difficulty sitting down on and standing up from a chair with arms (e.g., wheelchair, bedside commode, etc,.)?: Unable Help needed moving to and from a bed to chair (including a wheelchair)?: A Lot Help needed walking in hospital room?: A  Lot Help needed climbing 3-5 steps with a railing? : Total 6 Click Score: 8    End of Session Equipment Utilized During Treatment: Gait belt Activity Tolerance: Patient limited by fatigue;Patient limited by pain Patient left: in chair;with call bell/phone within reach;with family/visitor present(bariatric recliner) Nurse Communication: Mobility status PT Visit Diagnosis: Muscle weakness (generalized) (M62.81);Difficulty in walking, not elsewhere classified (R26.2);Pain;History of falling (Z91.81);Other abnormalities of gait and mobility (R26.89) Pain - part of body: Hip     Time:  - 1500 - 1524    Charges:    1 gt   1 ta                   G Codes:       Rachel Bo, SPTA 03/18/2018, 4:00 PM  Rica Koyanagi  PTA [facility]  Acute  Rehab Pager      [phone]

## 2018-03-19 LAB — BASIC METABOLIC PANEL
Anion gap: 4 — ABNORMAL LOW (ref 5–15)
BUN: 18 mg/dL (ref 8–23)
CO2: 29 mmol/L (ref 22–32)
Calcium: 8 mg/dL — ABNORMAL LOW (ref 8.9–10.3)
Chloride: 111 mmol/L (ref 98–111)
Creatinine, Ser: 1.13 mg/dL (ref 0.61–1.24)
GFR calc Af Amer: >60 mL/min (ref >60)
GFR calc non Af Amer: >60 mL/min (ref >60)
Glucose, Bld: 151 mg/dL — ABNORMAL HIGH (ref 70–99)
Potassium: 4 mmol/L (ref 3.5–5.1)
Sodium: 144 mmol/L (ref 135–145)

## 2018-03-19 LAB — TYPE AND SCREEN
ABO/RH(D): A POS
Antibody Screen: POSITIVE
Donor AG Type: NEGATIVE
Donor AG Type: NEGATIVE
Unit division: 0
Unit division: 0

## 2018-03-19 LAB — CBC
HCT: 27.9 % — ABNORMAL LOW (ref 39.0–52.0)
Hemoglobin: 8.6 g/dL — ABNORMAL LOW (ref 13.0–17.0)
MCH: 30.1 pg (ref 26.0–34.0)
MCHC: 30.8 g/dL (ref 30.0–36.0)
MCV: 97.6 fL (ref 78.0–100.0)
Platelets: 220 10*3/uL (ref 150–400)
RBC: 2.86 MIL/uL — ABNORMAL LOW (ref 4.22–5.81)
RDW: 17 % — ABNORMAL HIGH (ref 11.5–15.5)
WBC: 8.3 10*3/uL (ref 4.0–10.5)

## 2018-03-19 LAB — BPAM RBC
Blood Product Expiration Date: 201908072359
Blood Product Expiration Date: 201908082359
ISSUE DATE / TIME: 201907181233
ISSUE DATE / TIME: 201907181536
Unit Type and Rh: 6200
Unit Type and Rh: 6200

## 2018-03-19 MED ORDER — ENOXAPARIN SODIUM 40 MG/0.4ML ~~LOC~~ SOLN
40.0000 mg | SUBCUTANEOUS | Status: DC
  Administered 2018-03-19 – 2018-03-23 (×5): 40 mg via SUBCUTANEOUS
  Filled 2018-03-19 (×5): qty 0.4

## 2018-03-19 MED ORDER — PREDNISONE 5 MG PO TABS
7.5000 mg | ORAL_TABLET | Freq: Two times a day (BID) | ORAL | Status: DC
  Administered 2018-03-19 – 2018-03-23 (×10): 7.5 mg via ORAL
  Filled 2018-03-19 (×10): qty 2

## 2018-03-19 MED ORDER — TESTOSTERONE 50 MG/5GM (1%) TD GEL
10.0000 g | Freq: Every day | TRANSDERMAL | Status: DC
  Administered 2018-03-19 – 2018-03-21 (×3): 10 g via TRANSDERMAL
  Filled 2018-03-19 (×3): qty 10

## 2018-03-19 NOTE — Plan of Care (Signed)
Pt alert and oriented, complaints of pain.  OOB to chair with PT RN will monitor.

## 2018-03-19 NOTE — Progress Notes (Signed)
Physical Therapy Treatment Patient Details Name: [name] MRN: [id number] DOB: 08-03-1946 Today's Date: 03/19/2018    History of Present Illness 72 yo male admitted with L hip post op wound infection from SNF. 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     PT Comments    POD # 2 pm session Pt back in bed so performed some TE's  Followed by [facility].  Pt plans to D/C to SNF early next week.   Follow Up Recommendations  SNF     Equipment Recommendations  None recommended by PT    Recommendations for Other Services       Precautions / Restrictions Precautions Precautions: Posterior Hip;Fall Precaution Comments: check BP  ORTHOSTATIC Restrictions Weight Bearing Restrictions: No LLE Weight Bearing: Weight bearing as tolerated Other Position/Activity Restrictions: WBAT       Balance                                            Cognition Arousal/Alertness: Awake/alert Behavior During Therapy: WFL for tasks assessed/performed Overall Cognitive Status: Within Functional Limits for tasks assessed                                        Exercises LEFT  Hip TE's 10 reps ankle pumps 10 reps knee presses 10 reps heel slides AAROM 10 reps SAQ's 10 reps ABD (pt stated MD does NOT want him to perform this action) Followed by ICE   General Comments        Pertinent Vitals/Pain Pain Assessment: 0-10 Pain Score: 5  Pain Location: Groin; L hip Pain Descriptors / Indicators: Discomfort;Aching;Grimacing    Home Living                      Prior Function            PT Goals (current goals can now be found in the care plan section) Progress towards PT goals: Progressing toward goals    Frequency    Min 5X/week      PT Plan Current plan remains appropriate    Co-evaluation              AM-PAC PT "6 Clicks" Daily Activity  Outcome Measure  Difficulty turning over in bed (including adjusting  bedclothes, sheets and blankets)?: Unable Difficulty moving from lying on back to sitting on the side of the bed? : Unable Difficulty sitting down on and standing up from a chair with arms (e.g., wheelchair, bedside commode, etc,.)?: Unable Help needed moving to and from a bed to chair (including a wheelchair)?: A Lot Help needed walking in hospital room?: A Lot Help needed climbing 3-5 steps with a railing? : Total 6 Click Score: 8    End of Session Equipment Utilized During Treatment: Gait belt Activity Tolerance: Patient tolerated treatment well;Patient limited by fatigue Patient left: in chair;with call bell/phone within reach   PT Visit Diagnosis: Muscle weakness (generalized) (M62.81);Difficulty in walking, not elsewhere classified (R26.2);Pain;History of falling (Z91.81);Other abnormalities of gait and mobility (R26.89) Pain - Right/Left: Left Pain - part of body: Hip     Time: 8891-6945 PT Time Calculation (min) (ACUTE ONLY): 18 min  Charges:  $Therapeutic Exercise: 8-22 mins  G Codes:       Rica Koyanagi  PTA [facility]  Acute  Rehab Pager      [phone]

## 2018-03-19 NOTE — Progress Notes (Signed)
Pharmacy Antibiotic Note  [name] is a 72 y.o. male with recent L THA on 6/19 and was discharged on 6/25 to [facility].  Surgical wound became purulent with foul order on 7/12 and patient was started on vancomycin 1 gm IV q12h and zosyn 3.375 gm IV q6h with plan to treat for 7 days (thru 7/19).  He was transferred to [facility] on 7/15 for further workup and management of suspected hip infection.  Vancomycin and zosyn resumed on admission. - Per [facility], last dose of vancomycin 1gm was given at 0800 on 7/15.  Plan: Day 7 total abxs, D4 vanc as inpatient - Continue Vancomycin 1750mg  IV q24 - Continue current Zosyn dosing - Synovial fluid culture growing few GNR so likely can discontinue vanc and continue Zosyn pending final results - Daily SCr  Height: 5\' 8"  (172.7 cm) Weight: (!) 343 lb (155.6 kg) IBW/kg (Calculated) : 68.4  Temp (24hrs), Avg:98.7 F (37.1 C), Min:98 F (36.7 C), Max:99.9 F (37.7 C)  Recent Labs  Lab 03/15/18 1533 03/15/18 1539 03/15/18 1724 03/16/18 0559 03/17/18 0440 03/17/18 0804 03/17/18 1609 03/18/18 0434 03/19/18 0506  WBC 5.2  --   --  5.7  --  6.2  --  7.6 8.3  CREATININE 1.13  --   --  1.00 1.09 1.04  --  1.12 1.13  LATICACIDVEN  --  0.93 0.59  --   --   --   --   --   --   VANCORANDOM  --   --   --   --   --   --  14  --   --     Estimated Creatinine Clearance: 87.6 mL/min (by C-G formula based on SCr of 1.13 mg/dL).    Allergies  Allergen Reactions  . Bee Venom Swelling   Antimicrobials this admission: 7/12 (started at [facility] vanc >> 7/12 (started at [facility]) Zosyn >>  Dose adjustments this admission:  Microbiology results: 7/15 BCx x2: ngtd 7/17 Surg PCR: neg/neg 7/17 Synovial Cx: few GNR  Thank you for allowing pharmacy to be a part of this patient's care.  Adrian Saran, PharmD, BCPS Pager [phone] 03/19/2018 10:50 AM

## 2018-03-19 NOTE — Progress Notes (Addendum)
Physical Therapy Treatment Patient Details Name: [name] MRN: [id number] DOB: 04/30/1946 Today's Date: 03/19/2018    History of Present Illness 72 yo male admitted with L hip post op wound infection from SNF. 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     PT Comments    POD #2 assisted patient with bed mobility, transfers, and ambulation. Supine vitals:BP 144/64, HR 94, O2 100%. Patient able to elevate trunk under own power with use of rails and increased time; need min assist with moving LE's. EOB vitals: BP 154/67, HR 107, O2 100%. Patient must have bed significantly elevated to rise from sitting. Standing vitals: BP 114/64, HR 98, O2 99%. Patient was able to ambulate with a RW for 15 feet before he needed to stop and sit in recliner due to fatigue. Sitting vitals: BP 132/79, HR 116, O2 100%. Patient tolerated treatment well and reported a slight increase in pain after therapy.   Follow Up Recommendations  SNF     Equipment Recommendations  None recommended by PT    Recommendations for Other Services       Precautions / Restrictions Precautions Precautions: Posterior Hip;Fall Precaution Comments: check BP  ORTHOSTATIC Restrictions Weight Bearing Restrictions: No LLE Weight Bearing: Weight bearing as tolerated Other Position/Activity Restrictions: WBAT    Mobility  Bed Mobility Overal bed mobility: Needs Assistance Bed Mobility: Supine to Sit     Supine to sit: +2 for physical assistance;+2 for safety/equipment;Min assist;HOB elevated Sit to supine: Min assist;+2 for physical assistance;+2 for safety/equipment;HOB elevated   General bed mobility comments: min assist needed to move LE's  Transfers Overall transfer level: Needs assistance Equipment used: Rolling walker (2 wheeled) Transfers: Sit to/from Stand Sit to Stand: Min assist;+2 physical assistance;+2 safety/equipment;From elevated surface         General transfer comment: bed  must be elevated to assist with rising  Ambulation/Gait Ambulation/Gait assistance: Min assist;+2 physical assistance;+2 safety/equipment Gait Distance (Feet): 15 Feet Assistive device: Rolling walker (2 wheeled) Gait Pattern/deviations: Step-to pattern;Trunk flexed;Antalgic;Decreased step length - right;Decreased stance time - left Gait velocity: decreased   General Gait Details: had to stop and sit in recliner after 15 ft due to fatigue. Vitals looked good.   Stairs             Wheelchair Mobility    Modified Rankin (Stroke Patients Only)       Balance                                            Cognition Arousal/Alertness: Awake/alert Behavior During Therapy: WFL for tasks assessed/performed Overall Cognitive Status: Within Functional Limits for tasks assessed                                        Exercises Total Joint Exercises Ankle Circles/Pumps: AROM;Both;Seated;20 reps Quad Sets: Left;AROM;Seated;10 reps    General Comments        Pertinent Vitals/Pain Pain Assessment: 0-10 Pain Score: 6  Pain Location: Groin; L hip Pain Descriptors / Indicators: Discomfort;Aching;Grimacing    Home Living                      Prior Function            PT  Goals (current goals can now be found in the care plan section) Progress towards PT goals: Progressing toward goals    Frequency    Min 5X/week      PT Plan Current plan remains appropriate    Co-evaluation              AM-PAC PT "6 Clicks" Daily Activity  Outcome Measure  Difficulty turning over in bed (including adjusting bedclothes, sheets and blankets)?: Unable Difficulty moving from lying on back to sitting on the side of the bed? : Unable Difficulty sitting down on and standing up from a chair with arms (e.g., wheelchair, bedside commode, etc,.)?: Unable Help needed moving to and from a bed to chair (including a wheelchair)?: A Lot Help  needed walking in hospital room?: A Lot Help needed climbing 3-5 steps with a railing? : Total 6 Click Score: 8    End of Session Equipment Utilized During Treatment: Gait belt Activity Tolerance: Patient tolerated treatment well;Patient limited by fatigue Patient left: in chair;with call bell/phone within reach   PT Visit Diagnosis: Muscle weakness (generalized) (M62.81);Difficulty in walking, not elsewhere classified (R26.2);Pain;History of falling (Z91.81);Other abnormalities of gait and mobility (R26.89) Pain - Right/Left: Left Pain - part of body: Hip     Time: 0935-1003 PT Time Calculation (min) (ACUTE ONLY): 28 min  Charges:  $Gait Training: 8-22 mins $Therapeutic Exercise: 8-22 mins                    G Codes:       Rachel Bo, SPTA 03/19/2018, 12:46 PM

## 2018-03-19 NOTE — Progress Notes (Signed)
   Subjective: 2 Days Post-Op Procedure(s) (LRB): IRRIGATION AND DEBRIDEMENT LEFT THIGH WOUND (Left) Patient reports pain as moderate.   Patient seen in rounds with Dr. Wynelle Link. Patient is well, and has had no acute complaints or problems other than pain in the left leg. No SOB or chest pain. No issues overnight. Has some concerns about a few medications.  Plan is to go Skilled nursing facility after hospital stay.  Objective: Vital signs in last 24 hours: Temp:  [98 F (36.7 C)-99.9 F (37.7 C)] 98.1 F (36.7 C) (07/19 0504) Pulse Rate:  [82-99] 82 (07/19 0504) Resp:  [16] 16 (07/19 0504) BP: (105-159)/(55-93) 146/69 (07/19 0504) SpO2:  [95 %-99 %] 98 % (07/19 0504)  Intake/Output from previous day:  Intake/Output Summary (Last 24 hours) at 03/19/2018 0738 Last data filed at 03/19/2018 0507 Gross per 24 hour  Intake 2253.3 ml  Output 1605 ml  Net 648.3 ml     Labs: Recent Labs    03/17/18 0804 03/18/18 0434 03/18/18 2100 03/19/18 0506  HGB 8.9* 7.7* 8.6* 8.6*   Recent Labs    03/18/18 0434 03/18/18 2100 03/19/18 0506  WBC 7.6  --  8.3  RBC 2.56*  --  2.86*  HCT 25.5* 27.5* 27.9*  PLT 196  --  220   Recent Labs    03/18/18 0434 03/19/18 0506  NA 141 144  K 4.0 4.0  CL 107 111  CO2 29 29  BUN 16 18  CREATININE 1.12 1.13  GLUCOSE 218* 151*  CALCIUM 8.1* 8.0*    EXAM General - Patient is Alert and Oriented Extremity - Neurologically intact Intact pulses distally Dorsiflexion/Plantar flexion intact Compartment soft Dressing/Incision - clean, dry, no drainage Motor Function - intact, moving foot and toes well on exam.   Past Medical History:  Diagnosis Date  . Anginal pain ([place]) 2006   evaluated by cardio  . Arthritis    knees,feet,shoulders,elbows.hands  . Constipation   . Dyspnea    with exertion   . Dysrhythmia    Atrial Flutter- 2006- corredted itself  . Family history of adverse reaction to anesthesia    mother- with novocaine went  into shock  . GERD (gastroesophageal reflux disease)   . Headache   . Hemorrhoids   . History of blood transfusion   . Hypertension   . Insomnia   . Sleep apnea    cpap  . 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   Weight as of this encounter: 155.6 kg (343 lb). Advance diet Up with therapy  DVT Prophylaxis - Lovenox Weight-Bearing as tolerated   Continue with therapy today. Keep drain in until Monday. Will continue to follow culture results and adjust antibiotics accordingly. Will stay through the weekend. Hopeful for DC to SNF next week. Will resume testosterone at home. Will transition to Lovenox instead of aspirin. Adjust Prednisone taper.  Ardeen Jourdain, PA-C Orthopaedic Surgery 03/19/2018, 7:38 AM

## 2018-03-20 ENCOUNTER — Inpatient Hospital Stay: Payer: Self-pay

## 2018-03-20 LAB — CULTURE, BLOOD (ROUTINE X 2)
Culture: NO GROWTH
Culture: NO GROWTH
Special Requests: ADEQUATE
Special Requests: ADEQUATE

## 2018-03-20 LAB — CBC
HCT: 30.4 % — ABNORMAL LOW (ref 39.0–52.0)
Hemoglobin: 9.2 g/dL — ABNORMAL LOW (ref 13.0–17.0)
MCH: 30 pg (ref 26.0–34.0)
MCHC: 30.3 g/dL (ref 30.0–36.0)
MCV: 99 fL (ref 78.0–100.0)
Platelets: 241 10*3/uL (ref 150–400)
RBC: 3.07 MIL/uL — ABNORMAL LOW (ref 4.22–5.81)
RDW: 17 % — ABNORMAL HIGH (ref 11.5–15.5)
WBC: 8.9 10*3/uL (ref 4.0–10.5)

## 2018-03-20 LAB — CREATININE, SERUM
Creatinine, Ser: 1.04 mg/dL (ref 0.61–1.24)
GFR calc Af Amer: >60 mL/min (ref >60)
GFR calc non Af Amer: >60 mL/min (ref >60)

## 2018-03-20 MED ORDER — STERILE WATER FOR INJECTION IJ SOLN
INTRAMUSCULAR | Status: AC
  Filled 2018-03-20: qty 10

## 2018-03-20 MED ORDER — ALTEPLASE 2 MG IJ SOLR
2.0000 mg | Freq: Once | INTRAMUSCULAR | Status: AC
  Administered 2018-03-20: 2 mg
  Filled 2018-03-20: qty 2

## 2018-03-20 MED ORDER — PIPERACILLIN-TAZOBACTAM 3.375 G IVPB
3.3750 g | Freq: Three times a day (TID) | INTRAVENOUS | Status: DC
Start: 2018-03-20 — End: 2018-03-22
  Administered 2018-03-20 – 2018-03-22 (×6): 3.375 g via INTRAVENOUS
  Filled 2018-03-20 (×7): qty 50

## 2018-03-20 NOTE — Progress Notes (Signed)
Subjective: 3 Days Post-Op Procedure(s) (LRB): IRRIGATION AND DEBRIDEMENT LEFT THIGH WOUND (Left) Patient reports pain as mild and moderate.   Reports L thigh pain. Feeling much better than pre-op. PICC line apparently occluded at some point since receiving last abx dose. His peripheral IV is currently leaking. PICC team has asked for new order for alteplase to try again (first try  Unsuccessful in breaking up the clot).   Objective: Vital signs in last 24 hours: Temp:  [98 F (36.7 C)-98.9 F (37.2 C)] 98 F (36.7 C) (07/20 0555) Pulse Rate:  [73-105] 73 (07/20 0555) Resp:  [12-16] 16 (07/20 0555) BP: (140-175)/(60-76) 175/76 (07/20 0555) SpO2:  [86 %-100 %] 100 % (07/20 0555)  Intake/Output from previous day: 07/19 0701 - 07/20 0700 In: 1656.7 [P.O.:1200; I.V.:240; IV Piggyback:216.7] Out: 3320 [Urine:3100; Drains:220] Intake/Output this shift: Total I/O In: -  Out: 650 [Urine:650]  Recent Labs    03/18/18 0434 03/18/18 2100 03/19/18 0506 03/20/18 0348  HGB 7.7* 8.6* 8.6* 9.2*   Recent Labs    03/19/18 0506 03/20/18 0348  WBC 8.3 8.9  RBC 2.86* 3.07*  HCT 27.9* 30.4*  PLT 220 241   Recent Labs    03/18/18 0434 03/19/18 0506 03/20/18 0348  NA 141 144  --   K 4.0 4.0  --   CL 107 111  --   CO2 29 29  --   BUN 16 18  --   CREATININE 1.12 1.13 1.04  GLUCOSE 218* 151*  --   CALCIUM 8.1* 8.0*  --    No results for input(s): LABPT, INR in the last 72 hours.  Neurologically intact ABD soft Neurovascular intact Sensation intact distally Intact pulses distally Dorsiflexion/Plantar flexion intact Incision: dressing C/D/I and scant drainage No cellulitis present Compartment soft No calf pain or sign of DVT Minimal dried bloody drainage on dressing New dressing placed and reinforced  Assessment/Plan: 3 Days Post-Op Procedure(s) (LRB): IRRIGATION AND DEBRIDEMENT LEFT THIGH WOUND (Left) Advance diet Up with therapy Retry alteplase for clogged PICC. If  unsuccessful will need new PICC placed for continued IV vanco and zosyn- verbal given, discussed plan with nursing staff Continue Lovenox for DVT ppx Keep drain through weekend  [place], Layanna Charo M. 03/20/2018, 9:47 AM

## 2018-03-20 NOTE — Progress Notes (Signed)
Spoke with Lovena Le RN and Jenny Reichmann RN re PICC difficulties.  John IV Team RN to pull back 1-2 cm to attempt to correct potential malpositioned PICC line.   If unsuccessful, will place new PICC. Pt currently has PIV for meds per Jenny Reichmann and Lovena Le.

## 2018-03-20 NOTE — Progress Notes (Signed)
Physical Therapy Treatment Patient Details Name: [name] MRN: [id number] DOB: May 28, 1946 Today's Date: 03/20/2018    History of Present Illness 72 yo male admitted with L hip post op wound infection from SNF. 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     PT Comments    Patient progressing slowly with ambulation, still having difficulty with activity tolerance due to SOB, though no longer truly orthostatic.  Self limits with exercises due to pain and fear stating MD doesn't want any movement out to side (when assisted with knee flexion).  Feel he will benefit from SNF level rehab at d/c.   Follow Up Recommendations  SNF     Equipment Recommendations  None recommended by PT    Recommendations for Other Services       Precautions / Restrictions Precautions Precautions: Posterior Hip;Fall Precaution Comments: check BP  ORTHOSTATIC Restrictions Weight Bearing Restrictions: No LLE Weight Bearing: Weight bearing as tolerated    Mobility  Bed Mobility Overal bed mobility: Needs Assistance Bed Mobility: Supine to Sit     Supine to sit: +2 for physical assistance;+2 for safety/equipment;Min assist;HOB elevated     General bed mobility comments: assist to move legs and lift trunk and scoot to EOB, heavy UE use to pull himself into sitting  Transfers Overall transfer level: Needs assistance   Transfers: Sit to/from Stand Sit to Stand: Mod assist;+2 safety/equipment         General transfer comment: lifting assist from elevated bed  Ambulation/Gait Ambulation/Gait assistance: +2 safety/equipment;Min assist Gait Distance (Feet): 25 Feet Assistive device: Rolling walker (2 wheeled) Gait Pattern/deviations: Step-to pattern;Decreased stride length;Trunk flexed;Antalgic     General Gait Details: heavey UE use on walker, stops to rest in standing leaning on walker x 2; reports SOB; SpO2 difficult to obtain via finger monitor   Stairs              Wheelchair Mobility    Modified Rankin (Stroke Patients Only)       Balance   Sitting-balance support: Feet supported Sitting balance-[name]: Fair Sitting balance - Comments: can sit without UE support, but tends to hold onto railings   Standing balance support: Bilateral upper extremity supported Standing balance-[name]: Poor                              Cognition Arousal/Alertness: Awake/alert Behavior During Therapy: WFL for tasks assessed/performed;Anxious Overall Cognitive Status: Within Functional Limits for tasks assessed                                        Exercises Total Joint Exercises Ankle Circles/Pumps: AROM;10 reps;5 reps;Supine Quad Sets: AROM;10 reps;Left;Supine Short Arc Quad: AROM;Left;Supine;10 reps Heel Slides: AAROM;5 reps;Left;Supine(pt limited)    General Comments General comments (skin integrity, edema, etc.): BP supine 177/66 HR 75; sitting 162/73 HR 81; standing 153/123 (poor reading due to UE support on walker)      Pertinent Vitals/Pain Pain Score: 6  Pain Location: Groin; L hip Pain Descriptors / Indicators: Discomfort;Aching;Grimacing Pain Intervention(s): Monitored during session;Repositioned;Ice applied;RN gave pain meds during session    Home Living                      Prior Function            PT  Goals (current goals can now be found in the care plan section) Progress towards PT goals: Progressing toward goals    Frequency    Min 5X/week      PT Plan Current plan remains appropriate    Co-evaluation              AM-PAC PT "6 Clicks" Daily Activity  Outcome Measure  Difficulty turning over in bed (including adjusting bedclothes, sheets and blankets)?: Unable Difficulty moving from lying on back to sitting on the side of the bed? : Unable   Help needed moving to and from a bed to chair (including a wheelchair)?: A Lot Help needed walking in hospital  room?: A Lot Help needed climbing 3-5 steps with a railing? : Total 6 Click Score: 7    End of Session Equipment Utilized During Treatment: Gait belt Activity Tolerance: Patient limited by fatigue Patient left: with call bell/phone within reach;in chair Nurse Communication: Mobility status PT Visit Diagnosis: Muscle weakness (generalized) (M62.81);Difficulty in walking, not elsewhere classified (R26.2);Pain;History of falling (Z91.81);Other abnormalities of gait and mobility (R26.89) Pain - Right/Left: Left Pain - part of body: Hip     Time: 7341-9379 PT Time Calculation (min) (ACUTE ONLY): 35 min  Charges:  $Gait Training: 8-22 mins $Therapeutic Exercise: 8-22 mins                    G CodesMagda Kiel, [place] [phone] 03/20/2018    Reginia Naas 03/20/2018, 11:09 AM

## 2018-03-20 NOTE — Progress Notes (Signed)
Pt has expressed that he would prefer new PICC rather than continuing to try to reestablish function of current PICC. Notified Dierdre Highman, PA, & orders given for new PICC. Omara Alcon, [employer]

## 2018-03-21 NOTE — Progress Notes (Addendum)
Subjective: 4 Days Post-Op Procedure(s) (LRB): IRRIGATION AND DEBRIDEMENT LEFT THIGH WOUND (Left)  Patient reports pain as mild to moderate.  Tolerating POs well.  Admits to flatus.  Denies fever, chills, N/V, SOB, CP.  Lovena Le RN is in room, reporting PICC line issues.  Orders for new PICC line have been placed.  Patient reports that upon D/C he would like to go home.  States that his daughter lives across the street from him.  Objective:   VITALS:  Temp:  [97 F (36.1 C)-98.6 F (37 C)] 97.8 F (36.6 C) (07/21 0640) Pulse Rate:  [75-93] 79 (07/21 0640) Resp:  [20-21] 20 (07/21 0640) BP: (151-169)/(60-75) 169/62 (07/21 0640) SpO2:  [97 %-98 %] 97 % (07/21 0640)  General: WDWN patient in NAD. Psych:  Appropriate mood and affect. Neuro:  A&O x 3, Moving all extremities, sensation intact to light touch HEENT:  EOMs intact Chest:  Even non-labored respirations Skin:  Dressing C/D/I, no rashes or lesions.  Drain intact with serosanguinous fluid. Extremities: warm/dry, mild edema, no erythema or echymosis.  No lymphadenopathy. Pulses: Popliteus 2+ MSK:  ROM: TKE, MMT: able to perform quad set, (-) Homan's    LABS Recent Labs    03/18/18 2100 03/19/18 0506 03/20/18 0348  HGB 8.6* 8.6* 9.2*  WBC  --  8.3 8.9  PLT  --  220 241   Recent Labs    03/19/18 0506 03/20/18 0348  NA 144  --   K 4.0  --   CL 111  --   CO2 29  --   BUN 18  --   CREATININE 1.13 1.04  GLUCOSE 151*  --    No results for input(s): LABPT, INR in the last 72 hours.   Assessment/Plan: 4 Days Post-Op Procedure(s) (LRB): IRRIGATION AND DEBRIDEMENT LEFT THIGH WOUND (Left)  Patient seen in rounds for Dr. Wynelle Link New PICC line order placed  Continue IV vanc and zosyn Lovenox for DVT prophylaxis Keep drain through weekend Change dressing daily Note that patient would prefer to be D/C'd home if possible.  Mechele Claude PA-C [facility] Office:  [phone]

## 2018-03-22 DIAGNOSIS — M869 Osteomyelitis, unspecified: Secondary | ICD-10-CM

## 2018-03-22 DIAGNOSIS — Z7952 Long term (current) use of systemic steroids: Secondary | ICD-10-CM

## 2018-03-22 DIAGNOSIS — Z87891 Personal history of nicotine dependence: Secondary | ICD-10-CM

## 2018-03-22 DIAGNOSIS — Z87311 Personal history of (healed) other pathological fracture: Secondary | ICD-10-CM

## 2018-03-22 DIAGNOSIS — M316 Other giant cell arteritis: Secondary | ICD-10-CM

## 2018-03-22 DIAGNOSIS — Z95828 Presence of other vascular implants and grafts: Secondary | ICD-10-CM

## 2018-03-22 DIAGNOSIS — T8452XD Infection and inflammatory reaction due to internal left hip prosthesis, subsequent encounter: Secondary | ICD-10-CM

## 2018-03-22 LAB — AEROBIC/ANAEROBIC CULTURE W GRAM STAIN (SURGICAL/DEEP WOUND)

## 2018-03-22 LAB — AEROBIC/ANAEROBIC CULTURE (SURGICAL/DEEP WOUND)

## 2018-03-22 MED ORDER — SODIUM CHLORIDE 0.9% FLUSH: 10.0000 mL | INTRAVENOUS | Status: DC | PRN

## 2018-03-22 MED ORDER — CEFTRIAXONE SODIUM 2 G IJ SOLR
2.0000 g | INTRAMUSCULAR | Status: DC
  Administered 2018-03-22: 2 g via INTRAVENOUS
  Filled 2018-03-22: qty 20
  Filled 2018-03-22: qty 2

## 2018-03-22 MED ORDER — SODIUM CHLORIDE 0.9% FLUSH: 10.0000 mL | Freq: Two times a day (BID) | INTRAVENOUS | Status: DC

## 2018-03-22 NOTE — Progress Notes (Signed)
PHARMACY CONSULT NOTE FOR:  OUTPATIENT  PARENTERAL ANTIBIOTIC THERAPY (OPAT)  Indication: prosthetic joint infection Regimen: Rocephin 2gm IVPB daily End date: 04-28-18  IV antibiotic discharge orders are pended. To discharging provider:  please sign these orders via discharge navigator,  Select New Orders & click on the button choice - Manage This Unsigned Work.     Thank you for allowing pharmacy to be a part of this patient's care.  Biagio Borg 03/22/2018, 3:17 PM

## 2018-03-22 NOTE — Progress Notes (Signed)
Physical Therapy Treatment Patient Details Name: [name] MRN: [id number] DOB: 01-04-1946 Today's Date: 03/22/2018    History of Present Illness 72 yo male admitted with L hip post op wound infection from SNF. 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     PT Comments    Assisted OOB to amb in hallway.  Pt progressing well and plans to D/C to SNF for ST Rehab.    Follow Up Recommendations  SNF     Equipment Recommendations  None recommended by PT    Recommendations for Other Services       Precautions / Restrictions Precautions Precautions: Posterior Hip;Fall Restrictions Weight Bearing Restrictions: No LLE Weight Bearing: Weight bearing as tolerated    Mobility  Bed Mobility Overal bed mobility: Needs Assistance Bed Mobility: Supine to Sit     Supine to sit: Mod assist;Max assist     General bed mobility comments: HOB elevated and use of rail as well as increased time to complete scooting to EOB  Transfers Overall transfer level: Needs assistance Equipment used: Rolling walker (2 wheeled) Transfers: Sit to/from Stand Sit to Stand: Mod assist;+2 safety/equipment         General transfer comment: from elevated bed and forward momentum  Ambulation/Gait Ambulation/Gait assistance: Min assist;Mod assist Gait Distance (Feet): 25 Feet Assistive device: Rolling walker (2 wheeled) Gait Pattern/deviations: Step-to pattern;Decreased stride length;Trunk flexed;Antalgic Gait velocity: decreased   General Gait Details: tolerated distance well with Mod c/o fatigue wealkness esp B UE's due to heavy lean on walker    Stairs             Wheelchair Mobility    Modified [facility] (Stroke Patients Only)       Balance                                            Cognition Arousal/Alertness: Awake/alert Behavior During Therapy: WFL for tasks assessed/performed Overall Cognitive Status: Within Functional Limits for  tasks assessed                                        Exercises      General Comments        Pertinent Vitals/Pain Pain Assessment: Faces Faces Pain Scale: Hurts little more Pain Location: Groin; L hip Pain Descriptors / Indicators: Discomfort;Aching;Grimacing Pain Intervention(s): Monitored during session;Repositioned;Ice applied    Home Living                      Prior Function            PT Goals (current goals can now be found in the care plan section) Progress towards PT goals: Progressing toward goals    Frequency           PT Plan Current plan remains appropriate    Co-evaluation              AM-PAC PT "6 Clicks" Daily Activity  Outcome Measure  Difficulty turning over in bed (including adjusting bedclothes, sheets and blankets)?: A Lot Difficulty moving from lying on back to sitting on the side of the bed? : A Lot Difficulty sitting down on and standing up from a chair with arms (e.g., wheelchair, bedside commode, etc,.)?: A Lot  Help needed moving to and from a bed to chair (including a wheelchair)?: A Lot Help needed walking in hospital room?: A Lot Help needed climbing 3-5 steps with a railing? : Total 6 Click Score: 11    End of Session Equipment Utilized During Treatment: Gait belt Activity Tolerance: Patient limited by fatigue Patient left: with call bell/phone within reach;in chair Nurse Communication: Mobility status PT Visit Diagnosis: Muscle weakness (generalized) (M62.81);Difficulty in walking, not elsewhere classified (R26.2);Pain;History of falling (Z91.81);Other abnormalities of gait and mobility (R26.89)     Time: 4268-3419 PT Time Calculation (min) (ACUTE ONLY): 24 min  Charges:  $Gait Training: 8-22 mins $Therapeutic Activity: 8-22 mins                    G Codes:       Rica Koyanagi  PTA [facility]  Acute  Rehab Pager      [phone]

## 2018-03-22 NOTE — Clinical Social Work Placement (Addendum)
[facility] authorization received.  D/C Summary sent. PTAR arranged for transport.   CLINICAL SOCIAL WORK PLACEMENT  NOTE  Date:  03/22/2018  Patient Details  Name: [name] MRN: [id number] Date of Birth: 09/20/1945  Clinical Social Work is seeking post-discharge placement for this patient at the [place] level of care (*CSW will initial, date and re-position this form in  chart as items are completed):  Yes   Patient/family provided with [facility] Work Department's list of facilities offering this level of care within the geographic area requested by the patient (or if unable, by the patient's family).  Yes   Patient/family informed of their freedom to choose among providers that offer the needed level of care, that participate in Medicare, Medicaid or managed care program needed by the patient, have an available bed and are willing to accept the patient.  Yes   Patient/family informed of [facility]'s ownership interest in [facility] and [facility], as well as of the fact that they are under no obligation to receive care at these facilities.  PASRR submitted to EDS on       PASRR number received on       Existing PASRR number confirmed on       FL2 transmitted to all facilities in geographic area requested by pt/family on       FL2 transmitted to all facilities within larger geographic area on       Patient informed that his/her managed care company has contracts with or will negotiate with certain facilities, including the following:        Yes   Patient/family informed of bed offers received.  Patient chooses bed at [facility]     Physician recommends and patient chooses bed at      Patient to be transferred to [facility] on 03/23/18.  Patient to be transferred to facility by       Patient family notified on 03/23/18 of transfer.  Name of family member notified:  Pt. states he will inform family/friends      PHYSICIAN Please prepare priority discharge summary, including medications     Additional Comment:    _______________________________________________ Lia Hopping, LCSW 03/22/2018, 4:28 PM

## 2018-03-22 NOTE — Consult Note (Addendum)
[facility] for Infectious Disease    Date of Admission:  03/15/2018   Total days of antibiotics: 7 zosyn               Reason for Consult: prosthetic joint infection    Referring Provider: Alusio   Assessment: Prosthetic joint infection L hip ostoemyelitis  Plan: 1. Will change anbx to ceftriaxone 2. D/c zosyn 3. Write for pharm help with outpt anbx 4. D/c instructions as listed at bottom of consult.  5. See him back in id clinic in 1 month.   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 sodium chloride   Intravenous Once  . amitriptyline  25 mg Oral QHS  . amLODipine  10 mg Oral Daily  . cycloSPORINE  1 drop Both Eyes BID  . darifenacin  7.5 mg Oral Daily  . docusate sodium  100 mg Oral BID  . enoxaparin (LOVENOX) injection  40 mg Subcutaneous Q24H  . gabapentin  600 mg Oral Daily  . gabapentin  900 mg Oral QHS  . methadone  10 mg Oral QID  . methocarbamol  750 mg Oral QID  . pantoprazole  40 mg Oral Daily  . predniSONE  7.5 mg Oral BID WC  . sodium chloride flush  10-40 mL Intracatheter Q12H  . testosterone  10 g Transdermal QHS  . venlafaxine XR  300 mg Oral Q breakfast    HPI: [name] is a 72 y.o. 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 He was d/c to SNF. He was known to have a post-op hematoma.  At SNF he developed fever to 101.8 (7-11) then purulent d/c from his wound with foul odor (7-12). By 7-12 he had a PIC placed and was started on vanco/zosyn. He was unable to walk due to pain and came to ED on 7-15.  In hospital he has undergone I & D of his wound (03-17-18).  His hospital course has been notable for difficulty with fxn of his PIC (replaced today), he was continued on  vanco/zosyn.   He's been afebrile in the hospital (99.9 03-19-18). His WBC is normal.  His op Cx grew E coli (R- amp/sulb) and E aerogenes (R-ancef).   Review of Systems: Review of Systems  Constitutional: Positive for chills and fever.  Gastrointestinal: Negative for constipation and diarrhea.  Genitourinary: Negative for dysuria.  Neurological: Positive for headaches.  Please see HPI. All other systems reviewed and negative.   Past Medical History:  Diagnosis Date  . Anginal pain ([place] 2006   evaluated by cardio  . Arthritis    knees,feet,shoulders,elbows.hands  . Constipation   . Dyspnea    with exertion   . Dysrhythmia    Atrial Flutter- 2006- corredted itself  . Family history of adverse reaction to anesthesia    mother- with novocaine went into shock  . GERD (gastroesophageal reflux disease)   . Headache   . Hemorrhoids   . History of blood transfusion   . Hypertension   . Insomnia   . Sleep apnea    cpap  . 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 Smoking status: Former  Smoker    Packs/day: 2.00    Years: 10.00    Pack years: 20.00    Types: Cigarettes    Start date: 05/16/1966    Last attempt to quit: 03/11/1984    Years since quitting: 34.0  . Smokeless tobacco: Never Used  Substance Use Topics  . Alcohol use: Yes    Alcohol/week: 0.0 oz    Comment: occasionally a couple of times a year   . Drug use: No    Family History  Problem Relation Age of Onset  . Cancer Mother   . Alcohol abuse Father      Medications:  Scheduled: . sodium chloride   Intravenous Once  . amitriptyline  25 mg Oral QHS  . amLODipine  10 mg Oral Daily  . cycloSPORINE  1 drop Both Eyes BID  . darifenacin  7.5 mg Oral Daily  . docusate sodium  100 mg Oral BID  . enoxaparin (LOVENOX) injection  40 mg Subcutaneous Q24H  . gabapentin  600 mg Oral Daily  . gabapentin  900 mg Oral QHS  . methadone  10 mg Oral QID  . methocarbamol   750 mg Oral QID  . pantoprazole  40 mg Oral Daily  . predniSONE  7.5 mg Oral BID WC  . sodium chloride flush  10-40 mL Intracatheter Q12H  . testosterone  10 g Transdermal QHS  . venlafaxine XR  300 mg Oral Q breakfast    Abtx:  Anti-infectives (From admission, onward)   Start     Dose/Rate Route Frequency Ordered Stop   03/20/18 2000  piperacillin-tazobactam (ZOSYN) IVPB 3.375 g     3.375 g 12.5 mL/hr over 240 Minutes Intravenous Every 8 hours 03/20/18 1319     03/16/18 2000  vancomycin (VANCOCIN) 1,750 mg in sodium chloride 0.9 % 500 mL IVPB  Status:  Discontinued     1,750 mg 250 mL/hr over 120 Minutes Intravenous Every 24 hours 03/15/18 1937 03/20/18 1307   03/16/18 0200  piperacillin-tazobactam (ZOSYN) IVPB 3.375 g  Status:  Discontinued     3.375 g 12.5 mL/hr over 240 Minutes Intravenous Every 8 hours 03/15/18 1937 03/20/18 1319   03/15/18 1800  piperacillin-tazobactam (ZOSYN) IVPB 3.375 g     3.375 g 100 mL/hr over 30 Minutes Intravenous  Once 03/15/18 1746 03/15/18 1912   03/15/18 1800  vancomycin (VANCOCIN) 2,500 mg in sodium chloride 0.9 % 500 mL IVPB     2,500 mg 250 mL/hr over 120 Minutes Intravenous  Once 03/15/18 1752 03/15/18 2149        OBJECTIVE: Blood pressure 134/69, pulse 88, temperature 98.7 F (37.1 C), temperature source Oral, resp. rate 16, height 5' 8"  (1.727 m), weight (!) 155.6 kg (343 lb), SpO2 98 %.  Physical Exam  Constitutional: He is oriented to person, place, and time. He appears well-developed and well-nourished.  HENT:  Mouth/Throat: No oropharyngeal exudate.  Eyes: Pupils are equal, round, and reactive to light. EOM are normal.  Neck: Normal range of motion. Neck supple.  Cardiovascular: Normal rate, regular rhythm and normal heart sounds.  Pulmonary/Chest: Effort normal and breath sounds normal.  Abdominal: Soft. Bowel sounds are normal. There is no tenderness. There is no guarding.  Musculoskeletal: He exhibits no edema.       Arms:       Legs: Lymphadenopathy:    He has no cervical adenopathy.  Neurological: He is alert and oriented to person, place, and time.  Psychiatric: He has a normal mood and affect.  Lab Results No results found for this or any previous visit (from the past 48 hour(s)).    Component Value Date/Time   SDES  03/17/2018 1739    SYNOVIAL LEFT HIP Performed at [facility] [address] [place], [place] [address]    SPECREQUEST  03/17/2018 1739    NONE Performed at [facility], [place] [address], [place], [place] [address]    CULT  03/17/2018 1739    FEW ESCHERICHIA COLI FEW ENTEROBACTER AEROGENES NO ANAEROBES ISOLATED Performed at [facility], [place] [address] [place], [place] [address]    REPTSTATUS 03/22/2018 FINAL 03/17/2018 1739   No results found. Recent Results (from the past 240 hour(s))  Culture, blood (routine x 2)     Status: None   Collection Time: 03/15/18  3:47 PM  Result Value Ref Range Status   Specimen Description   Final    BLOOD RIGHT ARM Performed at [facility] [address] [place], [place] [address]    Special Requests   Final    BOTTLES DRAWN AEROBIC AND ANAEROBIC Blood Culture adequate volume Performed at [facility] [address], [place], [place] [address]    Culture   Final    NO GROWTH 5 DAYS Performed at [facility], [place] [address] [place], [place] [address]    Report Status 03/20/2018 FINAL  Final  Culture, blood (routine x 2)     Status: None   Collection Time: 03/15/18  4:00 PM  Result Value Ref Range Status   Specimen Description   Final    BLOOD LEFT FOREARM Performed at [facility] [address], [place], [place] [address]    Special Requests   Final    BOTTLES DRAWN AEROBIC AND ANAEROBIC Blood Culture adequate volume Performed at [facility] [address] [place], [place] [address]    Culture    Final    NO GROWTH 5 DAYS Performed at [facility], [place] [address], [place],  [address]    Report Status 03/20/2018 FINAL  Final  Surgical pcr screen     Status: None   Collection Time: 03/17/18  4:30 PM  Result Value Ref Range Status   MRSA, PCR NEGATIVE NEGATIVE Final   Staphylococcus aureus NEGATIVE NEGATIVE Final    Comment: (NOTE) The Xpert SA Assay (FDA approved for NASAL specimens in patients 69 years of age and older), is one component of a comprehensive surveillance program. It is not intended to diagnose infection nor to guide or monitor treatment. Performed at [facility], [place] [address], [place],  [address]   Aerobic/Anaerobic Culture (surgical/deep wound)     Status: None   Collection Time: 03/17/18  5:39 PM  Result Value Ref Range Status   Specimen Description   Final    SYNOVIAL LEFT HIP Performed at [facility] [address] [place],  [address]    Special Requests   Final    NONE Performed at [facility], [place] [address] [place],  [address]    Gram Stain   Final    ABUNDANT WBC PRESENT, PREDOMINANTLY PMN NO ORGANISMS SEEN Gram Stain Report Called to,Read Back By and Verified With: [facility] RN AT 1855 ON 03/17/18 BY N.THOMPSON Performed at [facility], [place] [address] [place],  [address]    Culture   Final    FEW ESCHERICHIA COLI  FEW ENTEROBACTER AEROGENES NO ANAEROBES ISOLATED Performed at [facility], [place] [address] [place], [place] [address]    Report Status 03/22/2018 FINAL  Final   Organism ID, Bacteria ESCHERICHIA COLI  Final   Organism ID, Bacteria ENTEROBACTER AEROGENES  Final      Susceptibility   Enterobacter aerogenes - MIC*    CEFAZOLIN >=64 RESISTANT Resistant     CEFEPIME <=1 SENSITIVE Sensitive     CEFTAZIDIME <=1 SENSITIVE Sensitive     CEFTRIAXONE <=1 SENSITIVE Sensitive     CIPROFLOXACIN <=0.25  SENSITIVE Sensitive     GENTAMICIN <=1 SENSITIVE Sensitive     IMIPENEM 0.5 SENSITIVE Sensitive     TRIMETH/SULFA <=20 SENSITIVE Sensitive     PIP/TAZO <=4 SENSITIVE Sensitive     * FEW ENTEROBACTER AEROGENES   Escherichia coli - MIC*    AMPICILLIN >=32 RESISTANT Resistant     CEFAZOLIN <=4 SENSITIVE Sensitive     CEFEPIME <=1 SENSITIVE Sensitive     CEFTAZIDIME <=1 SENSITIVE Sensitive     CEFTRIAXONE <=1 SENSITIVE Sensitive     CIPROFLOXACIN <=0.25 SENSITIVE Sensitive     GENTAMICIN <=1 SENSITIVE Sensitive     IMIPENEM <=0.25 SENSITIVE Sensitive     TRIMETH/SULFA <=20 SENSITIVE Sensitive     AMPICILLIN/SULBACTAM >=32 RESISTANT Resistant     PIP/TAZO <=4 SENSITIVE Sensitive     Extended ESBL NEGATIVE Sensitive     * FEW ESCHERICHIA COLI    Microbiology: Recent Results (from the past 240 hour(s))  Culture, blood (routine x 2)     Status: None   Collection Time: 03/15/18  3:47 PM  Result Value Ref Range Status   Specimen Description   Final    BLOOD RIGHT ARM Performed at [facility] [address], [place], [place] [address]    Special Requests   Final    BOTTLES DRAWN AEROBIC AND ANAEROBIC Blood Culture adequate volume Performed at [facility] [address] [place], [place] [address]    Culture   Final    NO GROWTH 5 DAYS Performed at [facility], [place] [address] [place], [place] [address]    Report Status 03/20/2018 FINAL  Final  Culture, blood (routine x 2)     Status: None   Collection Time: 03/15/18  4:00 PM  Result Value Ref Range Status   Specimen Description   Final    BLOOD LEFT FOREARM Performed at [facility] [address], [place], [place] [address]    Special Requests   Final    BOTTLES DRAWN AEROBIC AND ANAEROBIC Blood Culture adequate volume Performed at [facility] [address] [place], [place] [address]    Culture   Final    NO GROWTH 5 DAYS Performed  at [facility], [place] [address], [place], [place] [address]    Report Status 03/20/2018 FINAL  Final  Surgical pcr screen     Status: None   Collection Time: 03/17/18  4:30 PM  Result Value Ref Range Status   MRSA, PCR NEGATIVE NEGATIVE Final   Staphylococcus aureus NEGATIVE NEGATIVE Final    Comment: (NOTE) The Xpert SA Assay (FDA approved for NASAL specimens in patients 29 years of age and older), is one component of a comprehensive surveillance program. It is not intended to diagnose infection nor to guide or monitor treatment. Performed at [facility], [place] [address] [place], [place] [address]   Aerobic/Anaerobic Culture (surgical/deep  wound)     Status: None   Collection Time: 03/17/18  5:39 PM  Result Value Ref Range Status   Specimen Description   Final    SYNOVIAL LEFT HIP Performed at [facility] [address], [place], [place] [address]    Special Requests   Final    NONE Performed at [facility], [place] [address], [place], [place] [address]    Gram Stain   Final    ABUNDANT WBC PRESENT, PREDOMINANTLY PMN NO ORGANISMS SEEN Gram Stain Report Called to,Read Back By and Verified With: [facility] RN AT 1855 ON 03/17/18 BY N.THOMPSON Performed at [facility], [place] [address] [place], [place] [address]    Culture   Final    FEW ESCHERICHIA COLI FEW ENTEROBACTER AEROGENES NO ANAEROBES ISOLATED Performed at [facility], [place] [address] [place], [place] [address]    Report Status 03/22/2018 FINAL  Final   Organism ID, Bacteria ESCHERICHIA COLI  Final   Organism ID, Bacteria ENTEROBACTER AEROGENES  Final      Susceptibility   Enterobacter aerogenes - MIC*    CEFAZOLIN >=64 RESISTANT Resistant     CEFEPIME <=1 SENSITIVE Sensitive     CEFTAZIDIME <=1 SENSITIVE Sensitive     CEFTRIAXONE <=1 SENSITIVE Sensitive     CIPROFLOXACIN <=0.25 SENSITIVE Sensitive     GENTAMICIN <=1  SENSITIVE Sensitive     IMIPENEM 0.5 SENSITIVE Sensitive     TRIMETH/SULFA <=20 SENSITIVE Sensitive     PIP/TAZO <=4 SENSITIVE Sensitive     * FEW ENTEROBACTER AEROGENES   Escherichia coli - MIC*    AMPICILLIN >=32 RESISTANT Resistant     CEFAZOLIN <=4 SENSITIVE Sensitive     CEFEPIME <=1 SENSITIVE Sensitive     CEFTAZIDIME <=1 SENSITIVE Sensitive     CEFTRIAXONE <=1 SENSITIVE Sensitive     CIPROFLOXACIN <=0.25 SENSITIVE Sensitive     GENTAMICIN <=1 SENSITIVE Sensitive     IMIPENEM <=0.25 SENSITIVE Sensitive     TRIMETH/SULFA <=20 SENSITIVE Sensitive     AMPICILLIN/SULBACTAM >=32 RESISTANT Resistant     PIP/TAZO <=4 SENSITIVE Sensitive     Extended ESBL NEGATIVE Sensitive     * FEW ESCHERICHIA COLI    Radiographs and labs were personally reviewed by me.    Allergies  Allergen Reactions  . Bee Venom Swelling    OPAT Orders Discharge antibiotics: ceftriaxone 2g ivpb qday  Duration: 37 days End Date: 04-28-18  [facility] Care Per Protocol:  Labs weekly while on IV antibiotics: _x_ CBC with differential __ BMP _x_ CMP _x_ CRP _x_ ESR __ Vancomycin trough  _x_ Please pull PIC at completion of IV antibiotics __ Please leave PIC in place until doctor has seen patient or been notified  Fax weekly labs to [phone]  Clinic Follow Up Appt: [facility] [place] 4 weeks   Bobby Rumpf, MD [facility] for [facility] [place] [facility] [phone] 03/22/2018, 2:38 PM

## 2018-03-22 NOTE — Progress Notes (Signed)
Peripherally Inserted Central Catheter/Midline Placement  The IV Nurse has discussed with the patient and/or persons authorized to consent for the patient, the purpose of this procedure and the potential benefits and risks involved with this procedure.  The benefits include less needle sticks, lab draws from the catheter, and the patient may be discharged home with the catheter. Risks include, but not limited to, infection, bleeding, blood clot (thrombus formation), and puncture of an artery; nerve damage and irregular heartbeat and possibility to perform a PICC exchange if needed/ordered by physician.  Alternatives to this procedure were also discussed.  Bard Power PICC patient education guide, fact sheet on infection prevention and patient information card has been provided to patient /or left at bedside.    PICC/Midline Placement Documentation  PICC Single Lumen 29/93/71 PICC Left Basilic 49 cm (Active)  Indication for Insertion or Continuance of Line Home intravenous therapies (PICC only) 03/22/2018  9:00 AM  Site Assessment Clean;Dry;Intact 03/22/2018  9:00 AM  Line Status Flushed;Blood return noted 03/22/2018  9:00 AM  Dressing Type Transparent 03/22/2018  9:00 AM  Dressing Status Clean;Dry;Intact;Antimicrobial disc in place 03/22/2018  9:00 AM  Dressing Change Due 03/29/18 03/22/2018  9:00 AM       Jule Economy [place] 03/22/2018, 9:53 AM

## 2018-03-22 NOTE — Progress Notes (Signed)
  03/22/2018 5 Days Post-Op Procedure(s) (LRB): IRRIGATION AND DEBRIDEMENT LEFT THIGH WOUND (Left) Patient reports pain as mild.   Patient seen in rounds by Dr. Wynelle Link. Pt is tolerating POs without difficulty. Denies chest pain, SOB, fever or chills. New PICC line ordered on 03/21/2018 by Mechele Claude, PA-C.  Plan is to go Skilled nursing facility after hospital stay.  Vital signs in last 24 hours: Temp:  [98 F (36.7 C)-99.1 F (37.3 C)] 98 F (36.7 C) (07/22 0600) Pulse Rate:  [78-89] 78 (07/22 0600) Resp:  [18-21] 18 (07/22 0600) BP: (140-175)/(56-76) 175/76 (07/22 0600) SpO2:  [97 %-100 %] 97 % (07/22 0600)  I&O's: I/O last 3 completed shifts: In: 5026.8 [P.O.:560; I.V.:4231; IV Piggyback:235.8] Out: 8100 [Urine:7725; Drains:375] Total I/O In: -  Out: 850 [Urine:850]  Labs: Recent Labs    03/20/18 0348  HGB 9.2*   Recent Labs    03/20/18 0348  WBC 8.9  RBC 3.07*  HCT 30.4*  PLT 241   Recent Labs    03/20/18 0348  CREATININE 1.04   Exam: General - Patient is Alert and Oriented Extremity - Neurologically intact Neurovascular intact Sensation intact distally Dorsiflexion/Plantar flexion intact Dressing - dressing C/D/I Motor Function - intact, moving foot and toes well on exam.   Past Medical History:  Diagnosis Date  . Anginal pain ([place]) 2006   evaluated by cardio  . Arthritis    knees,feet,shoulders,elbows.hands  . Constipation   . Dyspnea    with exertion   . Dysrhythmia    Atrial Flutter- 2006- corredted itself  . Family history of adverse reaction to anesthesia    mother- with novocaine went into shock  . GERD (gastroesophageal reflux disease)   . Headache   . Hemorrhoids   . History of blood transfusion   . Hypertension   . Insomnia   . Sleep apnea    cpap  . 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   Weight as of this encounter: 155.6 kg (343 lb). Up with therapy  DVT Prophylaxis - Lovenox Weight-bearing as tolerated Hemovac drains left in place, as wound is still draining a moderate amount.  Infectious disease consult ordered for antibiotic management upon discharge.  Plan for discharge to SNF once placement is secured.   Theresa Duty, PA-C Orthopedic Surgery 03/22/2018, 8:09 AM

## 2018-03-22 NOTE — Progress Notes (Signed)
Pharmacy Antibiotic Note  [name] is a 72 y.o. 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  Surgical wound became purulent with foul order on 7/12 and patient was started on vancomycin 1 gm IV q12h and zosyn 3.375 gm IV q6h with plan to treat for 7 days (thru 7/19).  He was transferred to [facility] on 7/15 for further workup and management of suspected hip infection.  Vancomycin and zosyn resumed on admission. - Per [facility], last dose of vancomycin 1gm was given at 0800 on 7/15.  Plan: Day 10 total abxs - Continue current Zosyn dosing - Synovial fluid culture grew Ecoli and entercobacter - if discharged on IV abx's, would suggest change to Rocephin  - Daily SCr  Height: 5\' 8"  (172.7 cm) Weight: (!) 343 lb (155.6 kg) IBW/kg (Calculated) : 68.4  Temp (24hrs), Avg:98.6 F (37 C), Min:98 F (36.7 C), Max:99.1 F (37.3 C)  Recent Labs  Lab 03/15/18 1539 03/15/18 1724 03/16/18 0559 03/17/18 0440 03/17/18 0804 03/17/18 1609 03/18/18 0434 03/19/18 0506 03/20/18 0348  WBC  --   --  5.7  --  6.2  --  7.6 8.3 8.9  CREATININE  --   --  1.00 1.09 1.04  --  1.12 1.13 1.04  LATICACIDVEN 0.93 0.59  --   --   --   --   --   --   --   VANCORANDOM  --   --   --   --   --  14  --   --   --     Estimated Creatinine Clearance: 95.2 mL/min (by C-G formula based on SCr of 1.04 mg/dL).    Allergies  Allergen Reactions  . Bee Venom Swelling   Antimicrobials this admission: 7/12 (started at SNF) vanc >> 7/20 7/12 (started at [facility]) Zosyn >>  Dose adjustments this admission:  Microbiology results: 7/15 BCx x2: ngtd 7/17 Surg PCR: neg/neg 7/17 Synovial Cx: few GNR - Ecoli and enterobacter  Thank you for allowing pharmacy to be a part of this patient's care.  Adrian Saran, PharmD, BCPS Pager [phone] 03/22/2018 10:32 AM

## 2018-03-23 MED ORDER — DOCUSATE SODIUM 100 MG PO CAPS: 100.0000 mg | ORAL_CAPSULE | Freq: Two times a day (BID) | ORAL | 0 refills | Status: DC

## 2018-03-23 MED ORDER — OXYCODONE HCL 5 MG PO TABS: 5.0000 mg | ORAL_TABLET | Freq: Four times a day (QID) | ORAL | 0 refills | Status: DC | PRN

## 2018-03-23 MED ORDER — METHADONE HCL 10 MG PO TABS: 10.0000 mg | ORAL_TABLET | Freq: Four times a day (QID) | ORAL | 0 refills | Status: DC

## 2018-03-23 MED ORDER — ENOXAPARIN SODIUM 40 MG/0.4ML ~~LOC~~ SOLN: 40.0000 mg | SUBCUTANEOUS | 0 refills | Status: DC

## 2018-03-23 MED ORDER — CEFTRIAXONE IV (FOR PTA / DISCHARGE USE ONLY): 2.0000 g | INTRAVENOUS | 0 refills | Status: DC

## 2018-03-23 MED ORDER — BISACODYL 10 MG RE SUPP: 10.0000 mg | Freq: Every day | RECTAL | 0 refills | Status: DC | PRN

## 2018-03-23 MED ORDER — SODIUM CHLORIDE 0.9 % IV SOLN: 2.0000 g | INTRAVENOUS | 0 refills | Status: DC

## 2018-03-23 MED ORDER — PREDNISONE 2.5 MG PO TABS: 7.5000 mg | ORAL_TABLET | Freq: Two times a day (BID) | ORAL | 0 refills | Status: DC

## 2018-03-23 MED ORDER — ONDANSETRON HCL 4 MG PO TABS: 4.0000 mg | ORAL_TABLET | Freq: Four times a day (QID) | ORAL | 0 refills | Status: DC | PRN

## 2018-03-23 MED ORDER — HEPARIN SOD (PORK) LOCK FLUSH 100 UNIT/ML IV SOLN
250.0000 [IU] | INTRAVENOUS | Status: AC | PRN
  Administered 2018-03-23: 250 [IU]

## 2018-03-23 NOTE — Discharge Instructions (Signed)
Dr. Gaynelle Arabian Total Joint Specialist [facility] [address] [place], [place] [address] [phone]) [place] items at home which could result in a fall. This includes throw rugs or furniture in walking pathways.   ICE to the affected hip every three hours for 30 minutes at a time and then as needed for pain and swelling.  Continue to use ice on the hip for pain and swelling from surgery. You may notice swelling that will progress down to the foot and ankle.  This is normal after surgery.  Elevate the leg when you are not up walking on it.    Continue to use the breathing machine which will help keep your temperature down.  It is common for your temperature to cycle up and down following surgery, especially at night when you are not up moving around and exerting yourself.  The breathing machine keeps your lungs expanded and your temperature down.  DIET You may resume your previous home diet once your are discharged from the hospital.  DRESSING / WOUND CARE / SHOWERING You may shower 3 days after surgery, but keep the wounds dry during showering.  You may use an occlusive plastic wrap (Press'n Seal for example), NO SOAKING/SUBMERGING IN THE BATHTUB.  If the bandage gets wet, change with a clean dry gauze.  If the incision gets wet, pat the wound dry with a clean towel. You may start showering once you are discharged home but do not submerge the incision under water. Just pat the incision dry and apply a dry gauze dressing on daily. Change the surgical dressing daily and reapply a dry dressing each time.  ACTIVITY Walk with your walker as instructed. Use walker as long as suggested by your caregivers. Avoid periods of inactivity such as sitting longer than an hour when not asleep. This helps prevent blood clots.  You may resume a sexual relationship in one month or when given the OK by your doctor.  You may return to work once you are cleared by  your doctor.  Do not drive a car for 6 weeks or until released by you surgeon.  Do not drive while taking narcotics.  WEIGHT BEARING Weight bearing as tolerated with assist device (walker, cane, etc) as directed, use it as long as suggested by your surgeon or therapist, typically at least 4-6 weeks.  POSTOPERATIVE CONSTIPATION PROTOCOL Constipation - defined medically as fewer than three stools per week and severe constipation as less than one stool per week.  One of the most common issues patients have following surgery is constipation.  Even if you have a regular bowel pattern at home, your normal regimen is likely to be disrupted due to multiple reasons following surgery.  Combination of anesthesia, postoperative narcotics, change in appetite and fluid intake all can affect your bowels.  In order to avoid complications following surgery, here are some recommendations in order to help you during your recovery period.  Colace (docusate) - Pick up an over-the-counter form of Colace or another stool softener and take twice a day as long as you are requiring postoperative pain medications.  Take with a full glass of water daily.  If you experience loose stools or diarrhea, hold the colace until you stool forms back up.  If your symptoms do not get better within 1 week or if they get worse, check with your doctor.  Dulcolax (bisacodyl) - Pick up over-the-counter and take as directed by the product  packaging as needed to assist with the movement of your bowels.  Take with a full glass of water.  Use this product as needed if not relieved by Colace only.   MiraLax (polyethylene glycol) - Pick up over-the-counter to have on hand.  MiraLax is a solution that will increase the amount of water in your bowels to assist with bowel movements.  Take as directed and can mix with a glass of water, juice, soda, coffee, or tea.  Take if you go more than two days without a movement. Do not use MiraLax more than once  per day. Call your doctor if you are still constipated or irregular after using this medication for 7 days in a row.  If you continue to have problems with postoperative constipation, please contact the office for further assistance and recommendations.  If you experience "the worst abdominal pain ever" or develop nausea or vomiting, please contact the office immediatly for further recommendations for treatment.  ITCHING  If you experience itching with your medications, try taking only a single pain pill, or even half a pain pill at a time.  You can also use Benadryl over the counter for itching or also to help with sleep.   MEDICATIONS See your medication summary on the After Visit Summary that the nursing staff will review with you prior to discharge.  You may have some home medications which will be placed on hold until you complete the course of blood thinner medication.  It is important for you to complete the blood thinner medication as prescribed by your surgeon.  Continue your approved medications as instructed at time of discharge.  PRECAUTIONS If you experience chest pain or shortness of breath - call 911 immediately for transfer to the hospital emergency department.  If you develop a fever greater that 101 F, purulent drainage from wound, increased redness or drainage from wound, foul odor from the wound/dressing, or calf pain - CONTACT YOUR SURGEON.                                                   FOLLOW-UP APPOINTMENTS Make sure you keep all of your appointments after your operation with your surgeon and caregivers. You should call the office at the above phone number and make an appointment for approximately two weeks after the date of your surgery or on the date instructed by your surgeon outlined in the "After Visit Summary".  RANGE OF MOTION AND STRENGTHENING EXERCISES  These exercises are designed to help you keep full movement of your hip joint. Follow your caregiver's or  physical therapist's instructions. Perform all exercises about fifteen times, three times per day or as directed. Exercise both hips, even if you have had only one joint replacement. These exercises can be done on a training (exercise) mat, on the floor, on a table or on a bed. Use whatever works the best and is most comfortable for you. Use music or television while you are exercising so that the exercises are a pleasant break in your day. This will make your life better with the exercises acting as a break in routine you can look forward to.   Lying on your back, slowly slide your foot toward your buttocks, raising your knee up off the floor. Then slowly slide your foot back down until your leg is straight again.  Lying on your back spread your legs as far apart as you can without causing discomfort.   Lying on your side, raise your upper leg and foot straight up from the floor as far as is comfortable. Slowly lower the leg and repeat.   Lying on your back, tighten up the muscle in the front of your thigh (quadriceps muscles). You can do this by keeping your leg straight and trying to raise your heel off the floor. This helps strengthen the largest muscle supporting your knee.   Lying on your back, tighten up the muscles of your buttocks both with the legs straight and with the knee bent at a comfortable angle while keeping your heel on the floor.   IF YOU ARE TRANSFERRED TO A SKILLED REHAB FACILITY If the patient is transferred to a skilled rehab facility following release from the hospital, a list of the current medications will be sent to the facility for the patient to continue.  When discharged from the skilled rehab facility, please have the facility set up the patient's [place] prior to being released. Also, the skilled facility will be responsible for providing the patient with their medications at time of release from the facility to include their pain medication, the  muscle relaxants, and their blood thinner medication. If the patient is still at the rehab facility at time of the two week follow up appointment, the skilled rehab facility will also need to assist the patient in arranging follow up appointment in our office and any transportation needs.  MAKE SURE YOU:   Understand these instructions.   Get help right away if you are not doing well or get worse.    Pick up stool softner and laxative for home use following surgery while on pain medications. Do not submerge incision under water. Please use good hand washing techniques while changing dressing each day. May shower starting three days after surgery. Please use a clean towel to pat the incision dry following showers. Continue to use ice for pain and swelling after surgery. Do not use any lotions or creams on the incision until instructed by your surgeon.

## 2018-03-23 NOTE — Discharge Summary (Signed)
Physician Discharge Summary   Patient ID: [name] MRN: [id number] DOB/AGE: 72-Jul-1947 72 y.o.  Admit date: 03/15/2018 Discharge date: 03/23/2018 (tentative)  Primary Diagnosis: Left hip wound infection  Admission Diagnoses:  Past Medical History:  Diagnosis Date  . Anginal pain ([place]) 2006   evaluated by cardio  . Arthritis    knees,feet,shoulders,elbows.hands  . Constipation   . Dyspnea    with exertion   . Dysrhythmia    Atrial Flutter- 2006- corredted itself  . Family history of adverse reaction to anesthesia    mother- with novocaine went into shock  . GERD (gastroesophageal reflux disease)   . Headache   . Hemorrhoids   . History of blood transfusion   . Hypertension   . Insomnia   . Sleep apnea    cpap  . 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   Weight as of this encounter: 155.6 kg (343 lb).  Procedure(s) (LRB): IRRIGATION AND DEBRIDEMENT LEFT THIGH WOUND (Left)   Consults: ID  HPI: [name] a 72 y.o.malewho recently underwent a left total hip arthroplasty with revision by Dr.Aluisioon 02/17/2018 and was discharged to [facility] facility on6/25/19who presents the emergency department today for possible infection of the left hip.Patient was reported to have drainage since his surgery. He was recently seen in the office by Dr. Wynelle Link and had a known post op hematoma. According to the SNF, on 7/10 the drainage became purulent and had a foul odor to it on 7/12. The report that the patient began running a fever of 101.8 on 7/11 that continued into 7/12 (100.4).They have been managing the fever with 1000 mg of Tylenol 4 times daily. He was reported to have a PICC line  placed on Friday, 7/12 and was started on vancomycin and Zosyn. He was sent to the emergency department for concerns of a possible infected hip. Patient reported that he has not been ambulatory due to pain and denies any trauma since event. He endorsed some chills and associated fever as well as some nausea but denies any emesis.He noted that he has been receiving wound care for the left hip with frequent dressing changes including this morning. ED RN reported that the dressing was completely saturated upon arrival to the ED. Patient was monitored in the hospital while on antibiotics and wound did not seem to be improving. Due to this, he was taken to surgery on 03/17/2018 for irrigation and debridement of the wound.  Laboratory Data: Admission on 03/15/2018  Component Date Value Ref Range Status  . Lactic Acid, Venous 03/15/2018 0.93  0.5 - 1.9 mmol/L Final  . Sodium 03/15/2018 142  135 - 145 mmol/L Final  . Potassium 03/15/2018 4.7  3.5 - 5.1 mmol/L Final  . Chloride 03/15/2018 105  98 - 111 mmol/L Final   Please note change in reference range.  . CO2 03/15/2018 29  22 - 32 mmol/L Final  . Glucose, Bld 03/15/2018 111* 70 - 99 mg/dL Final   Please note change in reference range.  . BUN 03/15/2018 20  8 - 23 mg/dL Final   Please note change in reference range.  . Creatinine, Ser 03/15/2018 1.13  0.61 - 1.24 mg/dL Final  . Calcium  03/15/2018 8.3* 8.9 - 10.3 mg/dL Final  . Total Protein 03/15/2018 6.1* 6.5 - 8.1 g/dL Final  . Albumin 03/15/2018 2.4* 3.5 - 5.0 g/dL Final  . AST 03/15/2018 55* 15 - 41 U/L Final  . ALT 03/15/2018 52* 0 - 44 U/L Final   Please note change in reference range.  . Alkaline Phosphatase 03/15/2018 115  38 - 126 U/L Final  . Total Bilirubin 03/15/2018 0.7  0.3 - 1.2 mg/dL Final  . GFR calc non Af Amer 03/15/2018 >60  >60 mL/min Final  . GFR calc Af Amer 03/15/2018 >60  >60 mL/min Final   Comment: (NOTE) The eGFR has been calculated using the CKD EPI equation. This  calculation has not been validated in all clinical situations. eGFR's persistently <60 mL/min signify possible Chronic Kidney Disease.   Georgiann Hahn gap 03/15/2018 8  5 - 15 Final   Performed at [facility], [place] [address], [place], [place] [address]  . WBC 03/15/2018 5.2  4.0 - 10.5 K/uL Final  . RBC 03/15/2018 3.01* 4.22 - 5.81 MIL/uL Final  . Hemoglobin 03/15/2018 9.2* 13.0 - 17.0 g/dL Final  . HCT 03/15/2018 30.0* 39.0 - 52.0 % Final  . MCV 03/15/2018 99.7  78.0 - 100.0 fL Final  . MCH 03/15/2018 30.6  26.0 - 34.0 pg Final  . MCHC 03/15/2018 30.7  30.0 - 36.0 g/dL Final  . RDW 03/15/2018 16.0* 11.5 - 15.5 % Final  . Platelets 03/15/2018 209  150 - 400 K/uL Final  . Neutrophils Relative % 03/15/2018 65  % Final  . Neutro Abs 03/15/2018 3.4  1.7 - 7.7 K/uL Final  . Lymphocytes Relative 03/15/2018 19  % Final  . Lymphs Abs 03/15/2018 1.0  0.7 - 4.0 K/uL Final  . Monocytes Relative 03/15/2018 15  % Final  . Monocytes Absolute 03/15/2018 0.8  0.1 - 1.0 K/uL Final  . Eosinophils Relative 03/15/2018 1  % Final  . Eosinophils Absolute 03/15/2018 0.0  0.0 - 0.7 K/uL Final  . Basophils Relative 03/15/2018 0  % Final  . Basophils Absolute 03/15/2018 0.0  0.0 - 0.1 K/uL Final   Performed at [facility], [place] [address] [place], [place] [address]  . Color, Urine 03/15/2018 YELLOW  YELLOW Final  . APPearance 03/15/2018 CLEAR  CLEAR Final  . Specific Gravity, Urine 03/15/2018 1.006  1.005 - 1.030 Final  . pH 03/15/2018 7.0  5.0 - 8.0 Final  . Glucose, UA 03/15/2018 NEGATIVE  NEGATIVE mg/dL Final  . Hgb urine dipstick 03/15/2018 NEGATIVE  NEGATIVE Final  . Bilirubin Urine 03/15/2018 NEGATIVE  NEGATIVE Final  . Ketones, ur 03/15/2018 NEGATIVE  NEGATIVE mg/dL Final  . Protein, ur 03/15/2018 NEGATIVE  NEGATIVE mg/dL Final  . Nitrite 03/15/2018 NEGATIVE  NEGATIVE Final  . Leukocytes, UA 03/15/2018 NEGATIVE  NEGATIVE Final   Performed at [place] [address], [place], [place] [address]  . Specimen Description 03/15/2018    Final                   Value:BLOOD LEFT FOREARM Performed at [place] [address], [place], [place] [address]   . Special Requests 03/15/2018    Final                   Value:BOTTLES DRAWN AEROBIC AND ANAEROBIC Blood Culture adequate volume Performed at [place] [address] [place],  [address]   . Culture  03/15/2018    Final                   Value:NO GROWTH 5 DAYS Performed at [facility], [place] [address], [place], [place] [address]   . Report Status 03/15/2018 03/20/2018 FINAL   Final  . Specimen Description 03/15/2018    Final                   Value:BLOOD RIGHT ARM Performed at [facility], [place] [address], [place], [place] [address]   . Special Requests 03/15/2018    Final                   Value:BOTTLES DRAWN AEROBIC AND ANAEROBIC Blood Culture adequate volume Performed at [place] [address], [place], [place] [address]   . Culture 03/15/2018    Final                   Value:NO GROWTH 5 DAYS Performed at [facility], [place] [address] [place], [place] [address]   . Report Status 03/15/2018 03/20/2018 FINAL   Final  . Lactic Acid, Venous 03/15/2018 0.59  0.5 - 1.9 mmol/L Final  . Sodium 03/16/2018 141  135 - 145 mmol/L Final  . Potassium 03/16/2018 4.1  3.5 - 5.1 mmol/L Final  . Chloride 03/16/2018 106  98 - 111 mmol/L Final   Please note change in reference range.  . CO2 03/16/2018 28  22 - 32 mmol/L Final  . Glucose, Bld 03/16/2018 75  70 - 99 mg/dL Final   Please note change in reference range.  . BUN 03/16/2018 15  8 - 23 mg/dL Final   Please note change in reference range.  . Creatinine, Ser 03/16/2018 1.00  0.61 - 1.24 mg/dL Final  . Calcium 03/16/2018 8.4* 8.9 - 10.3 mg/dL Final  . Total Protein 03/16/2018 5.9* 6.5 - 8.1 g/dL Final  . Albumin 03/16/2018  2.3* 3.5 - 5.0 g/dL Final  . AST 03/16/2018 56* 15 - 41 U/L Final  . ALT 03/16/2018 55* 0 - 44 U/L Final   Please note change in reference range.  . Alkaline Phosphatase 03/16/2018 115  38 - 126 U/L Final  . Total Bilirubin 03/16/2018 0.7  0.3 - 1.2 mg/dL Final  . GFR calc non Af Amer 03/16/2018 >60  >60 mL/min Final  . GFR calc Af Amer 03/16/2018 >60  >60 mL/min Final   Comment: (NOTE) The eGFR has been calculated using the CKD EPI equation. This calculation has not been validated in all clinical situations. eGFR's persistently <60 mL/min signify possible Chronic Kidney Disease.   Georgiann Hahn gap 03/16/2018 7  5 - 15 Final   Performed at [facility], [place] [address] [place], [place] [address]  . WBC 03/16/2018 5.7  4.0 - 10.5 K/uL Final  . RBC 03/16/2018 3.35* 4.22 - 5.81 MIL/uL Final  . Hemoglobin 03/16/2018 10.3* 13.0 - 17.0 g/dL Final  . HCT 03/16/2018 33.7* 39.0 - 52.0 % Final  . MCV 03/16/2018 100.6* 78.0 - 100.0 fL Final  . MCH 03/16/2018 30.7  26.0 - 34.0 pg Final  . MCHC 03/16/2018 30.6  30.0 - 36.0 g/dL Final  . RDW 03/16/2018 16.0* 11.5 - 15.5 % Final  . Platelets 03/16/2018 215  150 - 400 K/uL Final   Performed at [facility], [place] [address] [place], [place] [address]  . Creatinine, Ser 03/17/2018 1.09  0.61 - 1.24 mg/dL Final  . GFR calc non Af Amer 03/17/2018 >60  >60 mL/min Final  . GFR calc Af Amer 03/17/2018 >60  >60 mL/min Final   Comment: (NOTE) The eGFR has been calculated using the CKD EPI equation. This calculation has not been validated in all clinical situations. eGFR's persistently <60 mL/min signify possible Chronic Kidney Disease. Performed at [facility], [place] [address], [place], [place] [address]   . WBC 03/17/2018 6.2  4.0 - 10.5 K/uL Final  . RBC 03/17/2018 2.90* 4.22 - 5.81 MIL/uL Final  . Hemoglobin 03/17/2018 8.9* 13.0 - 17.0 g/dL Final  . HCT 03/17/2018 28.9* 39.0 - 52.0 % Final  . MCV  03/17/2018 99.7  78.0 - 100.0 fL Final  . MCH 03/17/2018 30.7  26.0 - 34.0 pg Final  . MCHC 03/17/2018 30.8  30.0 - 36.0 g/dL Final  . RDW 03/17/2018 16.0* 11.5 - 15.5 % Final  . Platelets 03/17/2018 215  150 - 400 K/uL Final   Performed at [facility], [place] [address], [place], [place] [address]  . Sodium 03/17/2018 143  135 - 145 mmol/L Final  . Potassium 03/17/2018 3.9  3.5 - 5.1 mmol/L Final  . Chloride 03/17/2018 106  98 - 111 mmol/L Final   Please note change in reference range.  . CO2 03/17/2018 29  22 - 32 mmol/L Final  . Glucose, Bld 03/17/2018 82  70 - 99 mg/dL Final   Please note change in reference range.  . BUN 03/17/2018 15  8 - 23 mg/dL Final   Please note change in reference range.  . Creatinine, Ser 03/17/2018 1.04  0.61 - 1.24 mg/dL Final  . Calcium 03/17/2018 8.3* 8.9 - 10.3 mg/dL Final  . GFR calc non Af Amer 03/17/2018 >60  >60 mL/min Final  . GFR calc Af Amer 03/17/2018 >60  >60 mL/min Final   Comment: (NOTE) The eGFR has been calculated using the CKD EPI equation. This calculation has not been validated in all clinical situations. eGFR's persistently <60 mL/min signify possible Chronic Kidney Disease.   Georgiann Hahn gap 03/17/2018 8  5 - 15 Final   Performed at [facility], [place] [address], [place], [place] [address]  . ABO/RH(D) 03/17/2018 A POS   Final  . Antibody Screen 03/17/2018 POS   Final  . Sample Expiration 03/17/2018 03/20/2018   Final  . Antibody Identification 91/47/8295 ANTI K   Final  . Unit Number 03/17/2018 [id number]   Final  . Blood Component Type 03/17/2018 RED CELLS,LR   Final  . Unit division 03/17/2018 00   Final  . Status of Unit 03/17/2018 ISSUED,FINAL   Final  . Donor AG Type 03/17/2018 NEGATIVE FOR KELL ANTIGEN   Final  . Transfusion Status 03/17/2018 OK TO TRANSFUSE   Final  . Crossmatch Result 03/17/2018 COMPATIBLE   Final  . Unit Number 03/17/2018 [id number]   Final  . Blood Component  Type 03/17/2018 RED CELLS,LR   Final  . Unit division 03/17/2018 00   Final  . Status of Unit 03/17/2018 ISSUED,FINAL   Final  . Donor AG Type 03/17/2018 NEGATIVE FOR KELL ANTIGEN   Final  . Transfusion Status 03/17/2018 OK TO TRANSFUSE   Final  . Crossmatch Result 03/17/2018 COMPATIBLE   Final  . Vancomycin Rm 03/17/2018 14   Final   Comment:        Random Vancomycin therapeutic range is dependent on dosage and time of specimen collection.  A peak range is 20.0-40.0 ug/mL A trough range is 5.0-15.0 ug/mL        Performed at [place] [address], [place], [place] [address]   . MRSA, PCR 03/17/2018 NEGATIVE  NEGATIVE Final  . Staphylococcus aureus 03/17/2018 NEGATIVE  NEGATIVE Final   Comment: (NOTE) The Xpert SA Assay (FDA approved for NASAL specimens in patients 83 years of age and older), is one component of a comprehensive surveillance program. It is not intended to diagnose infection nor to guide or monitor treatment. Performed at [facility], [place] [address] [place], [place] [address]   . Specimen Description 03/17/2018    Final                   Value:SYNOVIAL LEFT HIP Performed at [facility], [place] [address] [place], [place] [address]   . Special Requests 03/17/2018    Final                   Value:NONE Performed at [facility], [place] [address], [place], [place] [address]   . Gram Stain 03/17/2018    Final                   Value:ABUNDANT WBC PRESENT, PREDOMINANTLY PMN NO ORGANISMS SEEN Gram Stain Report Called to,Read Back By and Verified With: [facility] RN AT 1855 ON 03/17/18 BY N.THOMPSON Performed at [facility], [place] [address] [place], [place] [address]   . Culture 03/17/2018    Final                   Value:FEW ESCHERICHIA COLI FEW ENTEROBACTER AEROGENES NO ANAEROBES ISOLATED Performed at [facility], [place] [address] [place], [place] [address]     . Report Status 03/17/2018 03/22/2018 FINAL   Final  . Organism ID, Bacteria 03/17/2018 ESCHERICHIA COLI   Final  . Organism ID, Bacteria 03/17/2018 ENTEROBACTER AEROGENES   Final  . WBC 03/18/2018 7.6  4.0 - 10.5 K/uL Final  . RBC 03/18/2018 2.56* 4.22 - 5.81 MIL/uL Final  . Hemoglobin 03/18/2018 7.7* 13.0 - 17.0 g/dL Final  . HCT 03/18/2018 25.5* 39.0 - 52.0 % Final  . MCV 03/18/2018 99.6  78.0 - 100.0 fL Final  . MCH 03/18/2018 30.1  26.0 - 34.0 pg Final  . MCHC 03/18/2018 30.2  30.0 - 36.0 g/dL Final  . RDW 03/18/2018 15.7* 11.5 - 15.5 % Final  . Platelets 03/18/2018 196  150 - 400 K/uL Final   Performed at [facility], [place] [address] [place], [place] [address]  . Sodium 03/18/2018 141  135 - 145 mmol/L Final  . Potassium 03/18/2018 4.0  3.5 - 5.1 mmol/L Final  . Chloride 03/18/2018 107  98 - 111 mmol/L Final   Please note change in reference range.  . CO2 03/18/2018 29  22 - 32 mmol/L Final  . Glucose, Bld 03/18/2018 218* 70 - 99 mg/dL Final   Please note change in reference range.  . BUN 03/18/2018 16  8 - 23 mg/dL Final   Please note change in reference range.  . Creatinine, Ser 03/18/2018 1.12  0.61 - 1.24 mg/dL Final  . Calcium 03/18/2018 8.1* 8.9 - 10.3 mg/dL Final  . GFR calc non Af Amer 03/18/2018 >60  >60 mL/min Final  . GFR calc Af Amer 03/18/2018 >60  >60 mL/min Final   Comment: (NOTE) The eGFR has been calculated using  the CKD EPI equation. This calculation has not been validated in all clinical situations. eGFR's persistently <60 mL/min signify possible Chronic Kidney Disease.   Georgiann Hahn gap 03/18/2018 5  5 - 15 Final   Performed at [facility], [place] [address] [place], [place] [address]  . Order Confirmation 03/18/2018    Final                   Value:ORDER PROCESSED BY BLOOD BANK Performed at [facility], [place] [address] [place], [place] [address]   . ISSUE DATE / TIME 03/17/2018 [id number]    Final  . Blood Product Unit Number 03/17/2018 [id number]   Final  . PRODUCT CODE 03/17/2018 [id number]   Final  . Unit Type and Rh 03/17/2018 6200   Final  . Blood Product Expiration Date 03/17/2018 [id number]   Final  . ISSUE DATE / TIME 03/17/2018 [id number]   Final  . Blood Product Unit Number 03/17/2018 [id number]   Final  . PRODUCT CODE 03/17/2018 [id number]   Final  . Unit Type and Rh 03/17/2018 6200   Final  . Blood Product Expiration Date 03/17/2018 [id number]   Final  . Sodium 03/19/2018 144  135 - 145 mmol/L Final  . Potassium 03/19/2018 4.0  3.5 - 5.1 mmol/L Final  . Chloride 03/19/2018 111  98 - 111 mmol/L Final   Please note change in reference range.  . CO2 03/19/2018 29  22 - 32 mmol/L Final  . Glucose, Bld 03/19/2018 151* 70 - 99 mg/dL Final   Please note change in reference range.  . BUN 03/19/2018 18  8 - 23 mg/dL Final   Please note change in reference range.  . Creatinine, Ser 03/19/2018 1.13  0.61 - 1.24 mg/dL Final  . Calcium 03/19/2018 8.0* 8.9 - 10.3 mg/dL Final  . GFR calc non Af Amer 03/19/2018 >60  >60 mL/min Final  . GFR calc Af Amer 03/19/2018 >60  >60 mL/min Final   Comment: (NOTE) The eGFR has been calculated using the CKD EPI equation. This calculation has not been validated in all clinical situations. eGFR's persistently <60 mL/min signify possible Chronic Kidney Disease.   Georgiann Hahn gap 03/19/2018 4* 5 - 15 Final   Performed at [facility], [place] [address] [place], [place] [address]  . WBC 03/19/2018 8.3  4.0 - 10.5 K/uL Final  . RBC 03/19/2018 2.86* 4.22 - 5.81 MIL/uL Final  . Hemoglobin 03/19/2018 8.6* 13.0 - 17.0 g/dL Final  . HCT 03/19/2018 27.9* 39.0 - 52.0 % Final  . MCV 03/19/2018 97.6  78.0 - 100.0 fL Final  . MCH 03/19/2018 30.1  26.0 - 34.0 pg Final  . MCHC 03/19/2018 30.8  30.0 - 36.0 g/dL Final  . RDW 03/19/2018 17.0* 11.5 - 15.5 % Final  . Platelets 03/19/2018 220  150 - 400 K/uL Final   Performed at  [facility], [place] [address] [place], [place] [address]  . Hemoglobin 03/18/2018 8.6* 13.0 - 17.0 g/dL Final  . HCT 03/18/2018 27.5* 39.0 - 52.0 % Final   Performed at [facility], [place] [address] [place], [place] [address]  . Creatinine, Ser 03/20/2018 1.04  0.61 - 1.24 mg/dL Final  . GFR calc non Af Amer 03/20/2018 >60  >60 mL/min Final  . GFR calc Af Amer 03/20/2018 >60  >60 mL/min Final   Comment: (NOTE) The eGFR has been calculated using the CKD EPI equation. This calculation has not  been validated in all clinical situations. eGFR's persistently <60 mL/min signify possible Chronic Kidney Disease. Performed at [facility], [place] [address], [place], [place] [address]   . WBC 03/20/2018 8.9  4.0 - 10.5 K/uL Final  . RBC 03/20/2018 3.07* 4.22 - 5.81 MIL/uL Final  . Hemoglobin 03/20/2018 9.2* 13.0 - 17.0 g/dL Final  . HCT 03/20/2018 30.4* 39.0 - 52.0 % Final  . MCV 03/20/2018 99.0  78.0 - 100.0 fL Final  . MCH 03/20/2018 30.0  26.0 - 34.0 pg Final  . MCHC 03/20/2018 30.3  30.0 - 36.0 g/dL Final  . RDW 03/20/2018 17.0* 11.5 - 15.5 % Final  . Platelets 03/20/2018 241  150 - 400 K/uL Final   Performed at [facility], [place] [address] [place], [place] [address]  Admission on 02/15/2018, Discharged on 02/23/2018  No results displayed because visit has over 200 results.       X-Rays:Dg Chest Portable 1 View  Result Date: 03/15/2018 CLINICAL DATA:  PICC line placement. EXAM: PORTABLE CHEST 1 VIEW COMPARISON:  Chest x-ray dated February 15, 2018. FINDINGS: Interval placement of a right upper extremity PICC line with the tip in the distal SVC. Stable cardiomegaly. Normal pulmonary vascularity. Unchanged chronic pleural thickening and subpleural scarring in the left mid lung. Unchanged chronic blunting of the left costophrenic angle. No consolidation, large pleural effusion, or pneumothorax. No acute osseous abnormality.  IMPRESSION: 1. Right upper extremity PICC line with tip in the distal SVC. Otherwise stable chest without active disease. Electronically Signed   By: Titus Dubin M.D.   On: 03/15/2018 15:53   Dg Hip Unilat With Pelvis 2-3 Views Left  Result Date: 03/15/2018 CLINICAL DATA:  LEFT total hip arthroplasty revision 02/17/2018. Patient presents now with acute onset of LEFT hip pain and erythema and drainage at the incision site. Patient began antibiotic therapy 2 days ago via PICC. EXAM: DG HIP (WITH OR WITHOUT PELVIS) 2-3V LEFT COMPARISON:  02/15/2018. FINDINGS: LATERAL images are underexposed due to cross-table lateral technique. Anatomic alignment post LEFT hip arthroplasty. Subacute fracture involving the proximal femoral metaphysis with normal healing (at the site of the periprosthetic lucency prior to the arthroplasty revision). Gas in the subcutaneous tissues overlying the LEFT hip and possible gas bubbles within the LEFT hip joint. IMPRESSION: 1. Gas in the subcutaneous tissues overlying the LEFT hip and possible small gas bubbles within the hip joint, highly suspicious for septic joint. 2. Subacute fracture involving the proximal femoral metaphysis with normal healing (at the site of the periprosthetic lucency prior to the arthroplasty revision). Electronically Signed   By: Evangeline Dakin M.D.   On: 03/15/2018 17:29   [place] Ekg Site Rite  Result Date: 03/20/2018 If Site Rite image not attached, placement could not be confirmed due to current cardiac rhythm.   EKG: Orders placed or performed during the hospital encounter of 03/15/18  . ED EKG  . ED EKG  . EKG 12-Lead  . EKG 12-Lead  . EKG      Hospital Course: Following appropriate workup and evaluation, the patient was taken to the OR and underwent the above stated procedure without complications.  Patient tolerated the procedure well and was later transferred to the recovery room and then to the floor for postoperative care.  They were  given PO and IV analgesics for pain control following their surgery.  They were given postoperative antibiotics of  Anti-infectives (From admission, onward)   Start     Dose/Rate Route  Frequency Ordered Stop   03/23/18 0000  cefTRIAXone (ROCEPHIN) IVPB  Status:  Discontinued     2 g Intravenous Every 24 hours 03/23/18 0902 03/23/18    03/23/18 0000  cefTRIAXone 2 g in sodium chloride 0.9 % 100 mL  Status:  Discontinued     2 g 200 mL/hr over 30 Minutes Intravenous Every 24 hours 03/23/18 0902 03/23/18    03/23/18 0000  cefTRIAXone (ROCEPHIN) IVPB     2 g Intravenous Every 24 hours 03/23/18 0905 04/29/18 2359   03/22/18 1800  cefTRIAXone (ROCEPHIN) 2 g in sodium chloride 0.9 % 100 mL IVPB     2 g 200 mL/hr over 30 Minutes Intravenous Every 24 hours 03/22/18 1510 04/28/18 1759   03/20/18 2000  piperacillin-tazobactam (ZOSYN) IVPB 3.375 g  Status:  Discontinued     3.375 g 12.5 mL/hr over 240 Minutes Intravenous Every 8 hours 03/20/18 1319 03/22/18 1510   03/16/18 2000  vancomycin (VANCOCIN) 1,750 mg in sodium chloride 0.9 % 500 mL IVPB  Status:  Discontinued     1,750 mg 250 mL/hr over 120 Minutes Intravenous Every 24 hours 03/15/18 1937 03/20/18 1307   03/16/18 0200  piperacillin-tazobactam (ZOSYN) IVPB 3.375 g  Status:  Discontinued     3.375 g 12.5 mL/hr over 240 Minutes Intravenous Every 8 hours 03/15/18 1937 03/20/18 1319   03/15/18 1800  piperacillin-tazobactam (ZOSYN) IVPB 3.375 g     3.375 g 100 mL/hr over 30 Minutes Intravenous  Once 03/15/18 1746 03/15/18 1912   03/15/18 1800  vancomycin (VANCOCIN) 2,500 mg in sodium chloride 0.9 % 500 mL IVPB     2,500 mg 250 mL/hr over 120 Minutes Intravenous  Once 03/15/18 1752 03/15/18 2149     and started on DVT prophylaxis in the form of Lovenox.   PT and OT were ordered for gait training and ambulation.  The patient's weight bearing status was WBAT with therapy. Discharge planning was consulted to help with postop disposition and equipment  needs.  Patient had a decent night on the evening of surgery. They started to get up OOB with therapy on POD #1. Patient received 2 units RBC transfusion and was started on antibiotics until wound cultures from surgery returned. Dressing was changed on POD #2 and incision was clean, dry, and intact. Continued to work with therapy over the weekend into POD #3 and #4. Wound culture came back significant for E. coli and Enterobacter pyogenes. An infectious disease consult was placed on POD #5 for antibiotic management. Per Dr. Bobby Rumpf, antibiotic management plan is as follows:  1. Will change anbx to ceftriaxone 2. D/c zosyn 3. See him back in id clinic in 1 month.   Discharge antibiotics: ceftriaxone 2g ivpb qday Duration: 37 days End Date: 04-28-18  [facility] Care Per Protocol:  Labs weekly while on IV antibiotics: - CBC with differential - CMP - CRP - ESR - Please pull PIC at completion of IV antibiotics  Patient was seen during rounds on POD #6 and was ready for discharge to SNF. Hemovac drains were left in to allow wound to continue draining. Patient will follow-up in the office in 1 week with Dr. Wynelle Link for drain removal.  Diet: Cardiac diet Activity:WBAT Follow-up: in 1 week Disposition - Skilled nursing facility Discharged Condition: stable  Discharge Instructions    Call MD / Call 911   Complete by:  As directed    If you experience chest pain or shortness of breath, CALL 911 and be transported  to the hospital emergency room.  If you develope a fever above 101 F, pus (white drainage) or increased drainage or redness at the wound, or calf pain, call your surgeon's office.   Change dressing   Complete by:  As directed    Change the dressing daily with sterile 4 x 4 inch gauze dressing and paper tape.   Constipation Prevention   Complete by:  As directed    Drink plenty of fluids.  Prune juice may be helpful.  You may use a stool softener, such as Colace (over the counter)  100 mg twice a day.  Use MiraLax (over the counter) for constipation as needed.   Diet - low sodium heart healthy   Complete by:  As directed    Do not sit on low chairs, stoools or toilet seats, as it may be difficult to get up from low surfaces   Complete by:  As directed    Driving restrictions   Complete by:  As directed    No driving for two weeks   Home infusion instructions [facility] May follow [place] Dosing Protocol; May administer Cathflo as needed to maintain patency of vascular access device.; Flushing of vascular access device: per [facility] Protocol: 0.9% NaCl pre/post medica...   Complete by:  As directed    Instructions:  May follow [place] Dosing Protocol   Instructions:  May administer Cathflo as needed to maintain patency of vascular access device.   Instructions:  Flushing of vascular access device: per [facility] Protocol: 0.9% NaCl pre/post medication administration and prn patency; Heparin 100 u/ml, 30m for implanted ports and Heparin 10u/ml, 54mfor all other central venous catheters.   Instructions:  May follow [facility] Anaphylaxis Protocol for First Dose Administration in the home: 0.9% NaCl at 25-50 ml/hr to maintain IV access for protocol meds. Epinephrine 0.3 ml IV/IM PRN and Benadryl 25-50 IV/IM PRN s/s of anaphylaxis.   Instructions:  [facility] Coord[facility] (RN) to assist per patient IV care needs in the home PRN.   TED hose   Complete by:  As directed    Use stockings (TED hose) for three weeks on both leg(s).  You may remove them at night for sleeping.   Weight bearing as tolerated   Complete by:  As directed      Allergies as of 03/23/2018      Reactions   Bee Venom Swelling      Medication List    STOP taking these medications   aspirin EC 81 MG tablet   oxycodone 5 MG capsule Commonly known as:  OXY-IR Replaced by:  oxyCODONE 5 MG immediate release tablet   piperacillin-tazobactam IVPB Commonly known as:  ZOSYN   PREDNISOLONE  PO   rivaroxaban 10 MG Tabs tablet Commonly known as:  XARELTO   vancomycin IVPB     TAKE these medications   acetaminophen 325 MG tablet Commonly known as:  TYLENOL Take 650 mg by mouth every 12 (twelve) hours.   amitriptyline 25 MG tablet Commonly known as:  ELAVIL Take 25 mg by mouth at bedtime.   amLODipine 10 MG tablet Commonly known as:  NORVASC Take 10 mg by mouth daily.   bisacodyl 10 MG suppository Commonly known as:  DULCOLAX Place 1 suppository (10 mg total) rectally daily as needed for moderate constipation.   cefTRIAXone IVPB Commonly known as:  ROCEPHIN Inject 2 g into the vein daily. Indication:  Prosthetic joint nfection Last Day of Therapy:  04/28/18 Labs - Once weekly:  CBC/D and BMP, Labs - Every other week:  ESR and CRP   CENTRUM ADULTS PO Take 1 tablet by mouth every evening.   cycloSPORINE 0.05 % ophthalmic emulsion Commonly known as:  RESTASIS Place 1 drop into both eyes 2 (two) times daily.   diphenhydrAMINE 25 MG tablet Commonly known as:  BENADRYL Take 25-50 mg by mouth every 8 (eight) hours as needed for allergies (bee stings).   docusate sodium 100 MG capsule Commonly known as:  COLACE Take 1 capsule (100 mg total) by mouth 2 (two) times daily.   enoxaparin 40 MG/0.4ML injection Commonly known as:  LOVENOX Inject 0.4 mLs (40 mg total) into the skin daily. Inject Lovenox once a day for 10 days after surgery followed by 3 weeks of a baby aspirin once a day. Start taking on:  03/24/2018   eucerin cream Apply 1 application topically daily.   famotidine 20 MG tablet Commonly known as:  PEPCID Take 1 tablet (20 mg total) by mouth at bedtime.   furosemide 20 MG tablet Commonly known as:  LASIX Take 1 tablet (20 mg total) by mouth daily as needed for fluid or edema (as needed for edema.).   gabapentin 300 MG capsule Commonly known as:  NEURONTIN Take 900 mg by mouth at bedtime.   gabapentin 600 MG tablet Commonly known as:   NEURONTIN Take 600 mg by mouth daily.   methadone 5 MG tablet Commonly known as:  DOLOPHINE Take 15 mg by mouth every 6 (six) hours.   methadone 10 MG tablet Commonly known as:  DOLOPHINE Take 1 tablet (10 mg total) by mouth 4 (four) times daily.   methocarbamol 750 MG tablet Commonly known as:  ROBAXIN Take 750 mg by mouth every 6 (six) hours.   nystatin powder Commonly known as:  MYCOSTATIN/NYSTOP Apply 1 g topically daily. Apply to right and left abdominal folds topically every day shift for rash   omeprazole 40 MG capsule Commonly known as:  PRILOSEC Take 40 mg by mouth every evening.   ondansetron 4 MG tablet Commonly known as:  ZOFRAN Take 1 tablet (4 mg total) by mouth every 6 (six) hours as needed for nausea.   oxyCODONE 5 MG immediate release tablet Commonly known as:  Oxy IR/ROXICODONE Take 1 tablet (5 mg total) by mouth every 6 (six) hours as needed for breakthrough pain. Replaces:  oxycodone 5 MG capsule   polyethylene glycol packet Commonly known as:  MIRALAX / GLYCOLAX Take 17 g by mouth daily as needed for mild constipation.   predniSONE 2.5 MG tablet Commonly known as:  DELTASONE Take 3 tablets (7.5 mg total) by mouth 2 (two) times daily with a meal. What changed:    medication strength  how much to take   PRESCRIPTION MEDICATION Place 1 application onto the skin at bedtime. Testosterone Cream 10%   solifenacin 10 MG tablet Commonly known as:  VESICARE Take 10 mg by mouth daily.   venlafaxine XR 150 MG 24 hr capsule Commonly known as:  EFFEXOR-XR Take 300 mg by mouth daily with breakfast.   Vitamin D3 2000 units Tabs Take 2,000 Units by mouth every evening.            Home Infusion Instuctions  (From admission, onward)        Start     Ordered   03/23/18 0000  Home infusion instructions [facility] May follow [place] Dosing Protocol; May administer Cathflo as needed to  maintain patency of vascular access device.;  Flushing of vascular access device: per [facility] Protocol: 0.9% NaCl pre/post medica...    Question Answer Comment  Instructions May follow [place] Dosing Protocol   Instructions May administer Cathflo as needed to maintain patency of vascular access device.   Instructions Flushing of vascular access device: per [facility] Protocol: 0.9% NaCl pre/post medication administration and prn patency; Heparin 100 u/ml, 19m for implanted ports and Heparin 10u/ml, 568mfor all other central venous catheters.   Instructions May follow [facility] Anaphylaxis Protocol for First Dose Administration in the home: 0.9% NaCl at 25-50 ml/hr to maintain IV access for protocol meds. Epinephrine 0.3 ml IV/IM PRN and Benadryl 25-50 IV/IM PRN s/s of anaphylaxis.   Instructions [facility] Infusion Coordinator (RN) to assist per patient IV care needs in the home PRN.      03/23/18 0902       Discharge Care Instructions  (From admission, onward)        Start     Ordered   03/23/18 0000  Weight bearing as tolerated     03/23/18 0903   03/23/18 0000  Change dressing    Comments:  Change the dressing daily with sterile 4 x 4 inch gauze dressing and paper tape.   03/23/18 0903      Contact information for follow-up providers    AlGaynelle ArabianMD. Schedule an appointment as soon as possible for a visit on 03/30/2018.   Specialty:  Orthopedic Surgery Contact information: [phone]          Contact information for after-discharge care    [facility] .   Service:  Skilled Nursing Contact information: [phone]                Signed: KrTheresa DutyPA-C Orthopaedic Surgery 03/23/2018, 11:26 AM

## 2018-03-23 NOTE — Progress Notes (Signed)
Second call made to Dr Aluisio's office to request Theresa Duty, PA do medication reconcilliation for discharge so DC instructions can be printed.  Also, patient pulled out his two Hemovacs upon sitting up in bed to go to [facility]. No measurable output noted. Gauze dressings placed over sites. Office staff stated "they are going to talk to Dr Maureen Ralphs about it now" and hung up phone before I could ask her name. Donne Hazel, RN

## 2018-03-23 NOTE — Progress Notes (Signed)
Call made to Dr Aluisio's office to request Theresa Duty, PA do   Medication reconcilliation for discharge so DC instructions can be printed. Spoke with [employer]. Donne Hazel, RN

## 2018-03-23 NOTE — Progress Notes (Signed)
Patient left 5W unit now on stretcher with PTAR , patient in no distress, Alert and oriented X4.

## 2018-03-23 NOTE — NC FL2 (Signed)
[place] LEVEL OF CARE SCREENING TOOL     IDENTIFICATION  Patient Name: [name] Birthdate: February 07, 1946 Sex: male Admission Date (Current Location): 03/15/2018  [facility] [place] Number:  Engineer, manufacturing systems and Address:  [facility],  [place] [address], [place]      Provider Number: [id number]  Attending Physician Name and Address:  Gaynelle Arabian, MD  Relative Name and Phone Number:       Current Level of Care: Hospital Recommended Level of Care: [place] Prior Approval Number:    Date Approved/Denied:   PASRR Number: [id number]  Discharge Plan:      Current Diagnoses: Patient Active Problem List   Diagnosis Date Noted  . Left hip postoperative wound infection 03/15/2018  . 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 Femur fracture, left ([place]) 02/15/2018  . Anemia 02/15/2018  . Femur fracture ([place]) 02/15/2018  . 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 SOB (shortness of breath) 03/31/2014  . Hypertension   . Hyponatremia 03/18/2012  . 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[place]) 03/17/2012    Orientation RESPIRATION BLADDER Height & Weight     Self, Time, Situation, Place  Normal Continent Weight: (!) 343 lb (155.6 kg) Height:  5\' 8"  (172.7 cm)  BEHAVIORAL SYMPTOMS/MOOD NEUROLOGICAL BOWEL NUTRITION STATUS        Diet(Regular)  AMBULATORY STATUS COMMUNICATION OF NEEDS Skin   Extensive Assist Verbally Surgical wounds                       Personal Care Assistance Level of Assistance  Bathing, Feeding, Dressing Bathing Assistance: Maximum assistance Feeding assistance: Independent Dressing Assistance: Independent     Functional Limitations Info  Sight, Hearing, Speech Sight Info: Adequate Hearing Info: Adequate Speech Info: Adequate    SPECIAL CARE FACTORS FREQUENCY  PT (By licensed PT), OT (By licensed OT)     PT Frequency: 5X/WEEK OT Frequency:  5X/WEEK            Contractures Contractures Info: Not present    Additional Factors Info  Code Status, Allergies Code Status Info: Fullcode Allergies Info: BEE VENOM           Current Medications (03/23/2018):  This is the current hospital active medication list Current Facility-Administered Medications  Medication Dose Route Frequency Provider Last Rate Last Dose  . 0.9 %  sodium chloride infusion (Manually program via Guardrails IV Fluids)   Intravenous Once Gaynelle Arabian, MD      . 0.9 %  sodium chloride infusion  250 mL Intravenous PRN Gaynelle Arabian, MD 10 mL/hr at 03/21/18 1941 250 mL at 03/21/18 1941  . 0.9 %  sodium chloride infusion   Intravenous Continuous Gaynelle Arabian, MD 75 mL/hr at 03/22/18 2107 1,000 mL at 03/22/18 2107  . acetaminophen (TYLENOL) tablet 650 mg  650 mg Oral Q6H PRN Gaynelle Arabian, MD   650 mg at 03/20/18 0949   Or  . acetaminophen (TYLENOL) suppository 650 mg  650 mg Rectal Q6H PRN Gaynelle Arabian, MD      . alum & mag hydroxide-simeth (MAALOX/MYLANTA) 200-200-20 MG/5ML suspension 15 mL  15 mL Oral Q4H PRN Cecilio Asper, Amber, PA-C   15 mL at 03/22/18 2108  . amitriptyline (ELAVIL) tablet 25 mg  25 mg Oral QHS Gaynelle Arabian, MD   25 mg at 03/22/18 2108  . amLODipine (NORVASC) tablet 10 mg  10 mg Oral Daily Gaynelle Arabian, MD  10 mg at 03/23/18 1051  . bisacodyl (DULCOLAX) suppository 10 mg  10 mg Rectal Daily PRN Aluisio, Pilar Plate, MD      . cefTRIAXone (ROCEPHIN) 2 g in sodium chloride 0.9 % 100 mL IVPB  2 g Intravenous Q24H Campbell Riches, MD   Stopped at 03/22/18 1734  . cycloSPORINE (RESTASIS) 0.05 % ophthalmic emulsion 1 drop  1 drop Both Eyes BID Gaynelle Arabian, MD   1 drop at 03/23/18 1050  . darifenacin (ENABLEX) 24 hr tablet 7.5 mg  7.5 mg Oral Daily Aluisio, Pilar Plate, MD   7.5 mg at 03/23/18 1051  . diphenhydrAMINE (BENADRYL) capsule 25-50 mg  25-50 mg Oral Q8H PRN Aluisio, Pilar Plate, MD      . docusate sodium (COLACE) capsule 100 mg  100 mg Oral  BID Gaynelle Arabian, MD   100 mg at 03/22/18 1000  . enoxaparin (LOVENOX) injection 40 mg  40 mg Subcutaneous Q24H Gaynelle Arabian, MD   40 mg at 03/23/18 0733  . furosemide (LASIX) tablet 20 mg  20 mg Oral Daily PRN Aluisio, Pilar Plate, MD      . gabapentin (NEURONTIN) capsule 600 mg  600 mg Oral Daily Gaynelle Arabian, MD   600 mg at 03/23/18 1050  . gabapentin (NEURONTIN) capsule 900 mg  900 mg Oral QHS Gaynelle Arabian, MD   900 mg at 03/22/18 2107  . methadone (DOLOPHINE) tablet 10 mg  10 mg Oral QID Gaynelle Arabian, MD   10 mg at 03/23/18 1305  . methocarbamol (ROBAXIN) tablet 750 mg  750 mg Oral QID Gaynelle Arabian, MD   750 mg at 03/23/18 1305  . ondansetron (ZOFRAN) tablet 4 mg  4 mg Oral Q6H PRN Gaynelle Arabian, MD   4 mg at 03/18/18 2112   Or  . ondansetron (ZOFRAN) injection 4 mg  4 mg Intravenous Q6H PRN Aluisio, Pilar Plate, MD      . oxyCODONE (Oxy IR/ROXICODONE) immediate release tablet 5 mg  5 mg Oral Q6H PRN Gaynelle Arabian, MD   5 mg at 03/22/18 2002  . pantoprazole (PROTONIX) EC tablet 40 mg  40 mg Oral Daily Gaynelle Arabian, MD   40 mg at 03/23/18 1051  . polyethylene glycol (MIRALAX / GLYCOLAX) packet 17 g  17 g Oral Daily PRN Aluisio, Pilar Plate, MD      . predniSONE (DELTASONE) tablet 7.5 mg  7.5 mg Oral BID WC Gaynelle Arabian, MD   7.5 mg at 03/23/18 0734  . sodium chloride flush (NS) 0.9 % injection 10-40 mL  10-40 mL Intracatheter PRN Aluisio, Pilar Plate, MD      . sodium chloride flush (NS) 0.9 % injection 10-40 mL  10-40 mL Intracatheter Q12H Aluisio, Pilar Plate, MD      . sodium chloride flush (NS) 0.9 % injection 10-40 mL  10-40 mL Intracatheter PRN Aluisio, Pilar Plate, MD      . sodium phosphate (FLEET) 7-19 GM/118ML enema 1 enema  1 enema Rectal Once PRN Aluisio, Pilar Plate, MD      . testosterone (ANDROGEL) 50 MG/5GM (1%) gel 10 g  10 g Transdermal QHS Gaynelle Arabian, MD   10 g at 03/21/18 2134  . venlafaxine XR (EFFEXOR-XR) 24 hr capsule 300 mg  300 mg Oral Q breakfast Gaynelle Arabian, MD   300 mg at 03/23/18  [id number]     Discharge Medications: Please see discharge summary for a list of discharge medications.  Relevant Imaging Results:  Relevant Lab Results:   Additional Information SS# [id number]. Weight 343 lb  Lia Hopping,  LCSW

## 2018-03-23 NOTE — Care Management Important Message (Signed)
Important Message  Patient Details  Name: [name] MRN: [id number] Date of Birth: [birth year]   Medicare Important Message Given:  Yes    Kerin Salen 03/23/2018, 11:16 AMImportant Message  Patient Details  Name: [name] MRN: [id number] Date of Birth: [birth year]   Medicare Important Message Given:  Yes    Kerin Salen 03/23/2018, 11:15 AM

## 2018-04-16 ENCOUNTER — Inpatient Hospital Stay (HOSPITAL_COMMUNITY)
Admission: EM | Admit: 2018-04-16 | Discharge: 2018-04-27 | DRG: 871 | Disposition: A | Discharge status: Skilled Nursing Facility | Payer: Medicare Other | Attending: Internal Medicine | Admitting: Internal Medicine

## 2018-04-16 ENCOUNTER — Emergency Department (HOSPITAL_COMMUNITY): Payer: Medicare Other

## 2018-04-16 ENCOUNTER — Encounter (HOSPITAL_COMMUNITY): Payer: Self-pay

## 2018-04-16 ENCOUNTER — Inpatient Hospital Stay (HOSPITAL_COMMUNITY): Payer: Medicare Other

## 2018-04-16 DIAGNOSIS — F329 Major depressive disorder, single episode, unspecified: Secondary | ICD-10-CM | POA: Diagnosis present

## 2018-04-16 DIAGNOSIS — J181 Lobar pneumonia, unspecified organism: Secondary | ICD-10-CM | POA: Diagnosis present

## 2018-04-16 DIAGNOSIS — R402360 Coma scale, best motor response, obeys commands, unspecified time: Secondary | ICD-10-CM | POA: Diagnosis present

## 2018-04-16 DIAGNOSIS — M19021 Primary osteoarthritis, right elbow: Secondary | ICD-10-CM | POA: Diagnosis present

## 2018-04-16 DIAGNOSIS — R441 Visual hallucinations: Secondary | ICD-10-CM | POA: Diagnosis present

## 2018-04-16 DIAGNOSIS — F05 Delirium due to known physiological condition: Secondary | ICD-10-CM | POA: Diagnosis not present

## 2018-04-16 DIAGNOSIS — K219 Gastro-esophageal reflux disease without esophagitis: Secondary | ICD-10-CM | POA: Diagnosis present

## 2018-04-16 DIAGNOSIS — M8580 Other specified disorders of bone density and structure, unspecified site: Secondary | ICD-10-CM | POA: Diagnosis present

## 2018-04-16 DIAGNOSIS — Z9989 Dependence on other enabling machines and devices: Secondary | ICD-10-CM

## 2018-04-16 DIAGNOSIS — I10 Essential (primary) hypertension: Secondary | ICD-10-CM | POA: Diagnosis not present

## 2018-04-16 DIAGNOSIS — Z6841 Body Mass Index (BMI) 40.0 and over, adult: Secondary | ICD-10-CM | POA: Diagnosis not present

## 2018-04-16 DIAGNOSIS — J189 Pneumonia, unspecified organism: Secondary | ICD-10-CM | POA: Diagnosis present

## 2018-04-16 DIAGNOSIS — Z7952 Long term (current) use of systemic steroids: Secondary | ICD-10-CM

## 2018-04-16 DIAGNOSIS — M19072 Primary osteoarthritis, left ankle and foot: Secondary | ICD-10-CM | POA: Diagnosis present

## 2018-04-16 DIAGNOSIS — M170 Bilateral primary osteoarthritis of knee: Secondary | ICD-10-CM | POA: Diagnosis present

## 2018-04-16 DIAGNOSIS — Z791 Long term (current) use of non-steroidal anti-inflammatories (NSAID): Secondary | ICD-10-CM

## 2018-04-16 DIAGNOSIS — Y831 Surgical operation with implant of artificial internal device as the cause of abnormal reaction of the patient, or of later complication, without mention of misadventure at the time of the procedure: Secondary | ICD-10-CM | POA: Diagnosis present

## 2018-04-16 DIAGNOSIS — R6510 Systemic inflammatory response syndrome (SIRS) of non-infectious origin without acute organ dysfunction: Secondary | ICD-10-CM | POA: Diagnosis present

## 2018-04-16 DIAGNOSIS — B952 Enterococcus as the cause of diseases classified elsewhere: Secondary | ICD-10-CM | POA: Diagnosis present

## 2018-04-16 DIAGNOSIS — I481 Persistent atrial fibrillation: Secondary | ICD-10-CM | POA: Diagnosis present

## 2018-04-16 DIAGNOSIS — K5900 Constipation, unspecified: Secondary | ICD-10-CM | POA: Diagnosis present

## 2018-04-16 DIAGNOSIS — I4891 Unspecified atrial fibrillation: Secondary | ICD-10-CM | POA: Diagnosis present

## 2018-04-16 DIAGNOSIS — D638 Anemia in other chronic diseases classified elsewhere: Secondary | ICD-10-CM | POA: Diagnosis present

## 2018-04-16 DIAGNOSIS — M19022 Primary osteoarthritis, left elbow: Secondary | ICD-10-CM | POA: Diagnosis present

## 2018-04-16 DIAGNOSIS — Z7901 Long term (current) use of anticoagulants: Secondary | ICD-10-CM

## 2018-04-16 DIAGNOSIS — J918 Pleural effusion in other conditions classified elsewhere: Secondary | ICD-10-CM | POA: Diagnosis present

## 2018-04-16 DIAGNOSIS — R51 Headache: Secondary | ICD-10-CM | POA: Diagnosis present

## 2018-04-16 DIAGNOSIS — T8452XA Infection and inflammatory reaction due to internal left hip prosthesis, initial encounter: Secondary | ICD-10-CM | POA: Diagnosis present

## 2018-04-16 DIAGNOSIS — Z79899 Other long term (current) drug therapy: Secondary | ICD-10-CM

## 2018-04-16 DIAGNOSIS — M19042 Primary osteoarthritis, left hand: Secondary | ICD-10-CM | POA: Diagnosis present

## 2018-04-16 DIAGNOSIS — Z9884 Bariatric surgery status: Secondary | ICD-10-CM

## 2018-04-16 DIAGNOSIS — Z87891 Personal history of nicotine dependence: Secondary | ICD-10-CM

## 2018-04-16 DIAGNOSIS — G4700 Insomnia, unspecified: Secondary | ICD-10-CM | POA: Diagnosis present

## 2018-04-16 DIAGNOSIS — H532 Diplopia: Secondary | ICD-10-CM | POA: Diagnosis present

## 2018-04-16 DIAGNOSIS — J9819 Other pulmonary collapse: Secondary | ICD-10-CM | POA: Diagnosis present

## 2018-04-16 DIAGNOSIS — R410 Disorientation, unspecified: Secondary | ICD-10-CM | POA: Diagnosis not present

## 2018-04-16 DIAGNOSIS — R0600 Dyspnea, unspecified: Secondary | ICD-10-CM

## 2018-04-16 DIAGNOSIS — E662 Morbid (severe) obesity with alveolar hypoventilation: Secondary | ICD-10-CM | POA: Diagnosis present

## 2018-04-16 DIAGNOSIS — R451 Restlessness and agitation: Secondary | ICD-10-CM | POA: Diagnosis not present

## 2018-04-16 DIAGNOSIS — Z91030 Bee allergy status: Secondary | ICD-10-CM

## 2018-04-16 DIAGNOSIS — M19011 Primary osteoarthritis, right shoulder: Secondary | ICD-10-CM | POA: Diagnosis present

## 2018-04-16 DIAGNOSIS — M19071 Primary osteoarthritis, right ankle and foot: Secondary | ICD-10-CM | POA: Diagnosis present

## 2018-04-16 DIAGNOSIS — N179 Acute kidney failure, unspecified: Secondary | ICD-10-CM | POA: Diagnosis present

## 2018-04-16 DIAGNOSIS — Z96643 Presence of artificial hip joint, bilateral: Secondary | ICD-10-CM | POA: Diagnosis present

## 2018-04-16 DIAGNOSIS — F419 Anxiety disorder, unspecified: Secondary | ICD-10-CM | POA: Diagnosis present

## 2018-04-16 DIAGNOSIS — I110 Hypertensive heart disease with heart failure: Secondary | ICD-10-CM | POA: Diagnosis present

## 2018-04-16 DIAGNOSIS — Z95828 Presence of other vascular implants and grafts: Secondary | ICD-10-CM

## 2018-04-16 DIAGNOSIS — E66813 Obesity, class 3: Secondary | ICD-10-CM

## 2018-04-16 DIAGNOSIS — A419 Sepsis, unspecified organism: Secondary | ICD-10-CM | POA: Diagnosis present

## 2018-04-16 DIAGNOSIS — M19041 Primary osteoarthritis, right hand: Secondary | ICD-10-CM | POA: Diagnosis present

## 2018-04-16 DIAGNOSIS — T8452XD Infection and inflammatory reaction due to internal left hip prosthesis, subsequent encounter: Secondary | ICD-10-CM | POA: Diagnosis not present

## 2018-04-16 DIAGNOSIS — R5381 Other malaise: Secondary | ICD-10-CM | POA: Diagnosis present

## 2018-04-16 DIAGNOSIS — Z7982 Long term (current) use of aspirin: Secondary | ICD-10-CM

## 2018-04-16 DIAGNOSIS — R0602 Shortness of breath: Secondary | ICD-10-CM

## 2018-04-16 DIAGNOSIS — Z7189 Other specified counseling: Secondary | ICD-10-CM | POA: Diagnosis not present

## 2018-04-16 DIAGNOSIS — G4733 Obstructive sleep apnea (adult) (pediatric): Secondary | ICD-10-CM

## 2018-04-16 DIAGNOSIS — Z79891 Long term (current) use of opiate analgesic: Secondary | ICD-10-CM

## 2018-04-16 DIAGNOSIS — M19012 Primary osteoarthritis, left shoulder: Secondary | ICD-10-CM | POA: Diagnosis present

## 2018-04-16 DIAGNOSIS — B9620 Unspecified Escherichia coli [E. coli] as the cause of diseases classified elsewhere: Secondary | ICD-10-CM | POA: Diagnosis present

## 2018-04-16 DIAGNOSIS — T8141XA Infection following a procedure, superficial incisional surgical site, initial encounter: Secondary | ICD-10-CM | POA: Diagnosis present

## 2018-04-16 DIAGNOSIS — R402140 Coma scale, eyes open, spontaneous, unspecified time: Secondary | ICD-10-CM | POA: Diagnosis present

## 2018-04-16 DIAGNOSIS — F32A Depression, unspecified: Secondary | ICD-10-CM

## 2018-04-16 DIAGNOSIS — R402250 Coma scale, best verbal response, oriented, unspecified time: Secondary | ICD-10-CM | POA: Diagnosis present

## 2018-04-16 DIAGNOSIS — Y95 Nosocomial condition: Secondary | ICD-10-CM | POA: Diagnosis present

## 2018-04-16 DIAGNOSIS — I5033 Acute on chronic diastolic (congestive) heart failure: Secondary | ICD-10-CM | POA: Diagnosis not present

## 2018-04-16 DIAGNOSIS — Z8679 Personal history of other diseases of the circulatory system: Secondary | ICD-10-CM

## 2018-04-16 DIAGNOSIS — M316 Other giant cell arteritis: Secondary | ICD-10-CM | POA: Diagnosis present

## 2018-04-16 DIAGNOSIS — T8149XA Infection following a procedure, other surgical site, initial encounter: Secondary | ICD-10-CM | POA: Diagnosis present

## 2018-04-16 LAB — CBC WITH DIFFERENTIAL/PLATELET
Basophils Absolute: 0 10*3/uL (ref 0.0–0.1)
Basophils Relative: 0 %
Eosinophils Absolute: 0.1 10*3/uL (ref 0.0–0.7)
Eosinophils Relative: 1 %
HCT: 29.9 % — ABNORMAL LOW (ref 39.0–52.0)
Hemoglobin: 9 g/dL — ABNORMAL LOW (ref 13.0–17.0)
Lymphocytes Relative: 10 %
Lymphs Abs: 0.7 10*3/uL (ref 0.7–4.0)
MCH: 29.2 pg (ref 26.0–34.0)
MCHC: 30.1 g/dL (ref 30.0–36.0)
MCV: 97.1 fL (ref 78.0–100.0)
Monocytes Absolute: 0.9 10*3/uL (ref 0.1–1.0)
Monocytes Relative: 12 %
Neutro Abs: 5.3 10*3/uL (ref 1.7–7.7)
Neutrophils Relative %: 77 %
Platelets: 172 10*3/uL (ref 150–400)
RBC: 3.08 MIL/uL — ABNORMAL LOW (ref 4.22–5.81)
RDW: 14.9 % (ref 11.5–15.5)
WBC: 7 10*3/uL (ref 4.0–10.5)

## 2018-04-16 LAB — BASIC METABOLIC PANEL
Anion gap: 10 (ref 5–15)
BUN: 32 mg/dL — ABNORMAL HIGH (ref 8–23)
CO2: 25 mmol/L (ref 22–32)
Calcium: 8.4 mg/dL — ABNORMAL LOW (ref 8.9–10.3)
Chloride: 105 mmol/L (ref 98–111)
Creatinine, Ser: 2.31 mg/dL — ABNORMAL HIGH (ref 0.61–1.24)
GFR calc Af Amer: 31 mL/min — ABNORMAL LOW (ref >60)
GFR calc non Af Amer: 27 mL/min — ABNORMAL LOW (ref >60)
Glucose, Bld: 109 mg/dL — ABNORMAL HIGH (ref 70–99)
Potassium: 4.4 mmol/L (ref 3.5–5.1)
Sodium: 140 mmol/L (ref 135–145)

## 2018-04-16 LAB — URINALYSIS, ROUTINE W REFLEX MICROSCOPIC
Bacteria, UA: NONE SEEN
Bilirubin Urine: NEGATIVE
Glucose, UA: NEGATIVE mg/dL
Hgb urine dipstick: NEGATIVE
Ketones, ur: NEGATIVE mg/dL
Nitrite: NEGATIVE
Protein, ur: 30 mg/dL — AB
Specific Gravity, Urine: 1.021 (ref 1.005–1.030)
pH: 5 (ref 5.0–8.0)

## 2018-04-16 LAB — VITAMIN B12: Vitamin B-12: 1113 pg/mL — ABNORMAL HIGH (ref 180–914)

## 2018-04-16 LAB — BRAIN NATRIURETIC PEPTIDE: B Natriuretic Peptide: 453 pg/mL — ABNORMAL HIGH (ref 0.0–100.0)

## 2018-04-16 LAB — TROPONIN I: Troponin I: <0.03 ng/mL (ref <0.03)

## 2018-04-16 LAB — LACTIC ACID, PLASMA: Lactic Acid, Venous: 1.3 mmol/L (ref 0.5–1.9)

## 2018-04-16 LAB — TSH: TSH: 1.118 u[IU]/mL (ref 0.350–4.500)

## 2018-04-16 MED ORDER — DILTIAZEM HCL-DEXTROSE 100-5 MG/100ML-% IV SOLN (PREMIX)
INTRAVENOUS | Status: AC
  Filled 2018-04-16: qty 100

## 2018-04-16 MED ORDER — AMITRIPTYLINE HCL 25 MG PO TABS
25.0000 mg | ORAL_TABLET | Freq: Every day | ORAL | Status: DC
  Administered 2018-04-16: 25 mg via ORAL
  Filled 2018-04-16: qty 1

## 2018-04-16 MED ORDER — VITAMIN D 1000 UNITS PO TABS
2000.0000 [IU] | ORAL_TABLET | Freq: Every evening | ORAL | Status: DC
  Administered 2018-04-17 – 2018-04-26 (×9): 2000 [IU] via ORAL
  Filled 2018-04-16 (×9): qty 2

## 2018-04-16 MED ORDER — ASPIRIN EC 81 MG PO TBEC
81.0000 mg | DELAYED_RELEASE_TABLET | Freq: Every day | ORAL | Status: DC
  Administered 2018-04-17 – 2018-04-19 (×2): 81 mg via ORAL
  Filled 2018-04-16 (×3): qty 1

## 2018-04-16 MED ORDER — PANTOPRAZOLE SODIUM 40 MG PO TBEC
40.0000 mg | DELAYED_RELEASE_TABLET | Freq: Every day | ORAL | Status: DC
  Administered 2018-04-17 – 2018-04-27 (×11): 40 mg via ORAL
  Filled 2018-04-16 (×11): qty 1

## 2018-04-16 MED ORDER — FAMOTIDINE 20 MG PO TABS
20.0000 mg | ORAL_TABLET | Freq: Every day | ORAL | Status: DC
  Administered 2018-04-17 – 2018-04-27 (×11): 20 mg via ORAL
  Filled 2018-04-16 (×11): qty 1

## 2018-04-16 MED ORDER — SODIUM CHLORIDE 0.9 % IV SOLN
2.0000 g | INTRAVENOUS | Status: DC
  Filled 2018-04-16 (×3): qty 2

## 2018-04-16 MED ORDER — NYSTATIN 100000 UNIT/GM EX POWD
1.0000 g | Freq: Every day | CUTANEOUS | Status: DC
  Administered 2018-04-18 – 2018-04-27 (×10): 1 g via TOPICAL
  Filled 2018-04-16: qty 15

## 2018-04-16 MED ORDER — PREDNISONE 5 MG PO TABS
7.5000 mg | ORAL_TABLET | Freq: Two times a day (BID) | ORAL | Status: DC
  Filled 2018-04-16 (×6): qty 1

## 2018-04-16 MED ORDER — TESTOSTERONE PROPIONATE POWD: 0.5000 mL | Status: DC

## 2018-04-16 MED ORDER — SODIUM CHLORIDE 0.9 % IV BOLUS
500.0000 mL | Freq: Once | INTRAVENOUS | Status: AC
  Administered 2018-04-16: 500 mL via INTRAVENOUS

## 2018-04-16 MED ORDER — ONDANSETRON HCL 4 MG/2ML IJ SOLN
4.0000 mg | Freq: Four times a day (QID) | INTRAMUSCULAR | Status: DC | PRN
  Administered 2018-04-19 – 2018-04-20 (×4): 4 mg via INTRAVENOUS
  Filled 2018-04-16 (×4): qty 2

## 2018-04-16 MED ORDER — HEPARIN SODIUM (PORCINE) 5000 UNIT/ML IJ SOLN
5000.0000 [IU] | Freq: Three times a day (TID) | INTRAMUSCULAR | Status: DC
  Administered 2018-04-16 – 2018-04-19 (×8): 5000 [IU] via SUBCUTANEOUS
  Filled 2018-04-16 (×8): qty 1

## 2018-04-16 MED ORDER — VANCOMYCIN HCL 10 G IV SOLR
2000.0000 mg | Freq: Once | INTRAVENOUS | Status: AC
  Administered 2018-04-16: 2000 mg via INTRAVENOUS
  Filled 2018-04-16: qty 2000

## 2018-04-16 MED ORDER — DARIFENACIN HYDROBROMIDE ER 7.5 MG PO TB24
15.0000 mg | ORAL_TABLET | Freq: Every day | ORAL | Status: DC
  Administered 2018-04-17 – 2018-04-27 (×11): 15 mg via ORAL
  Filled 2018-04-16 (×11): qty 2

## 2018-04-16 MED ORDER — SODIUM CHLORIDE 0.9 % IV SOLN
2.0000 g | Freq: Once | INTRAVENOUS | Status: AC
  Administered 2018-04-16: 2 g via INTRAVENOUS
  Filled 2018-04-16: qty 2

## 2018-04-16 MED ORDER — DILTIAZEM HCL-DEXTROSE 100-5 MG/100ML-% IV SOLN (PREMIX)
5.0000 mg/h | INTRAVENOUS | Status: DC
  Administered 2018-04-16: 15 mg/h via INTRAVENOUS
  Administered 2018-04-16: 5 mg/h via INTRAVENOUS
  Administered 2018-04-17 (×4): 15 mg/h via INTRAVENOUS
  Administered 2018-04-18: 10 mg/h via INTRAVENOUS
  Administered 2018-04-18: 15 mg/h via INTRAVENOUS
  Administered 2018-04-19 (×2): 10 mg/h via INTRAVENOUS
  Administered 2018-04-19: 15 mg/h via INTRAVENOUS
  Administered 2018-04-20 (×2): 10 mg/h via INTRAVENOUS
  Administered 2018-04-21: 5 mg/h via INTRAVENOUS
  Filled 2018-04-16 (×3): qty 100
  Filled 2018-04-16: qty 200
  Filled 2018-04-16 (×8): qty 100

## 2018-04-16 MED ORDER — FUROSEMIDE 20 MG PO TABS: 20.0000 mg | ORAL_TABLET | Freq: Every day | ORAL | Status: DC | PRN

## 2018-04-16 MED ORDER — METHADONE HCL 10 MG PO TABS
10.0000 mg | ORAL_TABLET | Freq: Four times a day (QID) | ORAL | Status: DC
  Administered 2018-04-16 – 2018-04-27 (×40): 10 mg via ORAL
  Filled 2018-04-16 (×41): qty 1

## 2018-04-16 MED ORDER — CYCLOSPORINE 0.05 % OP EMUL
1.0000 [drp] | Freq: Two times a day (BID) | OPHTHALMIC | Status: DC
  Administered 2018-04-17 – 2018-04-27 (×19): 1 [drp] via OPHTHALMIC
  Filled 2018-04-16 (×26): qty 1

## 2018-04-16 MED ORDER — BISACODYL 10 MG RE SUPP: 10.0000 mg | Freq: Every day | RECTAL | Status: DC | PRN

## 2018-04-16 MED ORDER — GABAPENTIN 300 MG PO CAPS
300.0000 mg | ORAL_CAPSULE | Freq: Every day | ORAL | Status: DC
  Administered 2018-04-17 – 2018-04-27 (×11): 300 mg via ORAL
  Filled 2018-04-16 (×11): qty 1

## 2018-04-16 MED ORDER — VANCOMYCIN HCL IN DEXTROSE 1-5 GM/200ML-% IV SOLN: 1000.0000 mg | Freq: Once | INTRAVENOUS | Status: DC

## 2018-04-16 MED ORDER — POLYETHYLENE GLYCOL 3350 17 G PO PACK: 17.0000 g | PACK | Freq: Every day | ORAL | Status: DC | PRN

## 2018-04-16 MED ORDER — OXYCODONE HCL 5 MG PO TABS
5.0000 mg | ORAL_TABLET | Freq: Four times a day (QID) | ORAL | Status: DC | PRN
  Administered 2018-04-17: 5 mg via ORAL
  Filled 2018-04-16: qty 1

## 2018-04-16 MED ORDER — VENLAFAXINE HCL ER 75 MG PO CP24
300.0000 mg | ORAL_CAPSULE | Freq: Every day | ORAL | Status: DC
  Administered 2018-04-17 – 2018-04-27 (×11): 300 mg via ORAL
  Filled 2018-04-16 (×11): qty 4

## 2018-04-16 MED ORDER — ONDANSETRON HCL 4 MG PO TABS
4.0000 mg | ORAL_TABLET | Freq: Four times a day (QID) | ORAL | Status: DC | PRN
  Administered 2018-04-27: 4 mg via ORAL
  Filled 2018-04-16: qty 1

## 2018-04-16 MED ORDER — ACETAMINOPHEN 650 MG RE SUPP: 650.0000 mg | Freq: Four times a day (QID) | RECTAL | Status: DC | PRN

## 2018-04-16 MED ORDER — ACETAMINOPHEN 325 MG PO TABS
650.0000 mg | ORAL_TABLET | Freq: Four times a day (QID) | ORAL | Status: DC | PRN
  Administered 2018-04-17 – 2018-04-19 (×2): 650 mg via ORAL
  Filled 2018-04-16 (×2): qty 2

## 2018-04-16 MED ORDER — SODIUM CHLORIDE 0.9 % IV SOLN
INTRAVENOUS | Status: DC
  Administered 2018-04-16 – 2018-04-20 (×7): via INTRAVENOUS

## 2018-04-16 MED ORDER — VANCOMYCIN HCL 10 G IV SOLR
1750.0000 mg | INTRAVENOUS | Status: DC
  Administered 2018-04-17 – 2018-04-18 (×2): 1750 mg via INTRAVENOUS
  Filled 2018-04-16 (×4): qty 1750

## 2018-04-16 MED ORDER — DILTIAZEM LOAD VIA INFUSION
15.0000 mg | Freq: Once | INTRAVENOUS | Status: AC
  Administered 2018-04-16: 15 mg via INTRAVENOUS

## 2018-04-16 MED ORDER — DOCUSATE SODIUM 100 MG PO CAPS
100.0000 mg | ORAL_CAPSULE | Freq: Two times a day (BID) | ORAL | Status: DC
  Administered 2018-04-16 – 2018-04-27 (×20): 100 mg via ORAL
  Filled 2018-04-16 (×21): qty 1

## 2018-04-16 NOTE — ED Notes (Signed)
EKG handed off to Dr. Regenia Skeeter

## 2018-04-16 NOTE — Progress Notes (Addendum)
Pharmacy Antibiotic Note  [name] is a 71 y.o. male admitted on 04/16/2018 with HCAP.  Pharmacy has been consulted for vancomycin and cefepime dosing. Pt was most recently on long-term Rocephin therapy, with stop date of 04-28-18.  Plan:  Loading dose: vancomycin 2g IV x1 dose  Maintenance dose:  vancomycin 1.75g IV q24h Goal vancomycin trough range: 15-20   Mcg/mL  Start cefepime 2g IV q12h Pharmacy will continue to monitor renal function, vancomycin troughs as clinically indicated, cultures and patient progress.   Weight: (!) 343 lb (155.6 kg)  Temp (24hrs), Avg:97.9 F (36.6 C), Min:97.9 F (36.6 C), Max:97.9 F (36.6 C)  Recent Labs  Lab 04/16/18 1248 04/16/18 1323  WBC 7.0  --   CREATININE 2.31*  --   LATICACIDVEN  --  1.3    CrClest~   64  mL/min     Allergies  Allergen Reactions  . Bee Venom Swelling    Antimicrobials this admission: 8/16 vancomycin>> 8/16 cefepime >>   Microbiology results: 8/16 MRSA PCR: negative 8/16 BCx2: ng <24h 8/16 UCx:  In progress 8/16 Legionella:   Thank you for allowing pharmacy to be a part of this patient's care.  Despina Pole, Pharm. D. Clinical Pharmacist 04/16/2018 5:12 PM

## 2018-04-16 NOTE — ED Provider Notes (Signed)
[facility] EMERGENCY DEPARTMENT Provider Note   CSN: [id number] Arrival date & time: 04/16/18  1221     History   Chief Complaint Chief Complaint  Patient presents with  . Shortness of Breath    HPI [name] is a 72 y.o. male.  HPI  72 year old male presents with a chief complaint of dizziness and shortness of breath.  Patient states that started about 3 days ago.  He recently had a prolonged hospital course for an infected left hip prosthesis.  He is currently on IV antibiotics at home as well as heparin.  The patient has not been having any fevers.  He is been having some cough but no chest pain.  No palpitations but EMS noted he was in A. fib.  He denies a known history of A. fib.  The patient also has been having dizziness that feels like a lightheaded sensation.  There is no room spinning.  It is not positional and seems to come and go.  No headache associated with this.  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  When he looks at his arm it like there is another arm beside it but it looks different and is not a Personal assistant.  He also has seen people that are not normally there.  This started around 3 AM.  He feels generally weak and is having pain and weakness in bilateral legs.  Chronic pitting edema that is not worse than typical.  No urinary symptoms.  Past Medical History:  Diagnosis Date  . Anginal pain ([place]) 2006   evaluated by cardio  . Arthritis    knees,feet,shoulders,elbows.hands  . Constipation   . Dyspnea    with exertion   . Dysrhythmia    Atrial Flutter- 2006- corredted itself  . Family history of adverse reaction to anesthesia    mother- with novocaine went into shock  . GERD (gastroesophageal reflux disease)   . Headache   . Hemorrhoids   . History of blood transfusion   . Hypertension   . Insomnia   . Sleep apnea    cpap  . 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 Left hip postoperative wound infection 03/15/2018  . 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 Femur fracture, left ([place]) 02/15/2018  . Anemia 02/15/2018  . Femur fracture ([place]) 02/15/2018  . 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 SOB (shortness of breath) 03/31/2014  . Hypertension   . Hyponatremia 03/18/2012  . 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[place]) 03/17/2012    Past Surgical History:  Procedure Laterality Date  . APPENDECTOMY    . ARTERY BIOPSY Left 12/30/2017   Procedure: BIOPSY TEMPORAL ARTERY;  Surgeon: Fanny Skates, MD;  Location: [facility];  Service: General;  Laterality: Left;  . CHOLECYSTECTOMY    . COLONOSCOPY W/ POLYPECTOMY    . ESOPHAGOGASTRODUODENOSCOPY (EGD) WITH PROPOFOL  07/06/2012   Procedure: ESOPHAGOGASTRODUODENOSCOPY (EGD) WITH PROPOFOL;  Surgeon: Jeryl Columbia, MD;  Location: [facility];  Service: Endoscopy;  Laterality: N/A;  . ESOPHAGOSCOPY    . EYE SURGERY     left eye- muscle repair  . HERNIA REPAIR     ventral hernia  . INCISION AND DRAINAGE HIP Left 03/17/2018   Procedure: IRRIGATION AND DEBRIDEMENT LEFT THIGH WOUND;  Surgeon: Gaynelle Arabian, MD;  Location: [facility];  Service: Orthopedics;  Laterality: Left;  . JOINT REPLACEMENT     bilateral hips.  Right broke and had to be re placed again  . MASS EXCISION N/A 03/02/2015   Procedure: EXCISION ABDOMINAL WALL MASS;  Surgeon: Alphonsa Overall, MD;  Location:  [facility];  Service: General;  Laterality: N/A;  . TOTAL HIP REVISION Left 02/17/2018   Procedure: Left total hip arthroplasty revision;  Surgeon: Gaynelle Arabian, MD;  Location: [facility];  Service: Orthopedics;  Laterality: Left;  Marland Kitchen VERTICAL BANDED GASTROPLASTY          Home Medications    Prior to Admission medications   Medication Sig Start Date End Date Taking? Authorizing Provider  acetaminophen (TYLENOL) 325 MG tablet Take 650 mg by mouth every 12 (twelve)  hours.    [provider]  amitriptyline (ELAVIL) 25 MG tablet Take 25 mg by mouth at bedtime.    [provider]  amLODipine (NORVASC) 10 MG tablet Take 10 mg by mouth daily.    [provider]  bisacodyl (DULCOLAX) 10 MG suppository Place 1 suppository (10 mg total) rectally daily as needed for moderate constipation. 03/23/18   Edmisten, Kristie L, PA  cefTRIAXone (ROCEPHIN) IVPB Inject 2 g into the vein daily. Indication:  Prosthetic joint nfection Last Day of Therapy:  04/28/18 Labs - Once weekly:  CBC/D and BMP, Labs - Every other week:  ESR and CRP 03/23/18 04/29/18  Edmisten, Kristie L, PA  Cholecalciferol (VITAMIN D3) 2000 units TABS Take 2,000 Units by mouth every evening.     [provider]  cycloSPORINE (RESTASIS) 0.05 % ophthalmic emulsion Place 1 drop into both eyes 2 (two) times daily.     [provider]  diphenhydrAMINE (BENADRYL) 25 MG tablet Take 25-50 mg by mouth every 8 (eight) hours as needed for allergies (bee stings).     [provider]  docusate sodium (COLACE) 100 MG capsule Take 1 capsule (100 mg total) by mouth 2 (two) times daily. 03/23/18   Edmisten, Kristie L, PA  enoxaparin (LOVENOX) 40 MG/0.4ML injection Inject 0.4 mLs (40 mg total) into the skin daily. Inject Lovenox once a day for 10 days after surgery followed by 3 weeks of a baby aspirin once a day. 03/24/18   Edmisten, Kristie L, PA  famotidine (PEPCID) 20 MG tablet Take 1 tablet (20 mg total) by mouth at bedtime. 02/23/18 03/25/18  Arrien, Jimmy Picket, MD  furosemide (LASIX) 20 MG tablet Take 1 tablet (20 mg total) by mouth daily as needed for fluid or edema (as needed for edema.). 02/23/18   Arrien, Jimmy Picket, MD  gabapentin (NEURONTIN) 300 MG capsule Take 900 mg by mouth at bedtime.     [provider]  gabapentin (NEURONTIN) 600 MG tablet Take 600 mg by mouth daily.    [provider]  methadone (DOLOPHINE) 10 MG tablet Take 1 tablet  (10 mg total) by mouth 4 (four) times daily. 03/23/18   Edmisten, Kristie L, PA  methadone (DOLOPHINE) 5 MG tablet Take 15 mg by mouth every 6 (six) hours.    [provider]  methocarbamol (ROBAXIN) 750 MG tablet Take 750 mg by mouth every 6 (six) hours.     [provider]  Multiple Vitamins-Minerals (CENTRUM ADULTS PO) Take 1 tablet by mouth every evening.     [provider]  nystatin (MYCOSTATIN/NYSTOP) powder Apply 1 g topically daily. Apply to right and left abdominal folds topically every day shift for rash  [provider]  omeprazole (PRILOSEC) 40 MG capsule Take 40 mg by mouth every evening.     [provider]  ondansetron (ZOFRAN) 4 MG tablet Take 1 tablet (4 mg total) by mouth every 6 (six) hours as needed for nausea. 03/23/18   Edmisten, Kristie L, PA  oxyCODONE (OXY IR/ROXICODONE) 5 MG immediate release tablet Take 1 tablet (5 mg total) by mouth every 6 (six) hours as needed for breakthrough pain. 03/23/18   Edmisten, Kristie L, PA  polyethylene glycol (MIRALAX / GLYCOLAX) packet Take 17 g by mouth daily as needed for mild constipation. 02/23/18   Arrien, Jimmy Picket, MD  predniSONE (DELTASONE) 2.5 MG tablet Take 3 tablets (7.5 mg total) by mouth 2 (two) times daily with a meal. 03/23/18   Edmisten, Kristie L, PA  PRESCRIPTION MEDICATION Place 1 application onto the skin at bedtime. Testosterone Cream 10%    [provider]  Skin Protectants, Misc. (EUCERIN) cream Apply 1 application topically daily.    [provider]  solifenacin (VESICARE) 10 MG tablet Take 10 mg by mouth daily.    [provider]  venlafaxine XR (EFFEXOR-XR) 150 MG 24 hr capsule Take 300 mg by mouth daily with breakfast.    [provider]    Family History Family History  Problem Relation Age of Onset  . Cancer Mother   . Alcohol abuse Father     Social History Social History   Tobacco Use  . Smoking status: Former Smoker     Packs/day: 2.00    Years: 10.00    Pack years: 20.00    Types: Cigarettes    Start date: 05/16/1966    Last attempt to quit: 03/11/1984    Years since quitting: 34.1  . Smokeless tobacco: Never Used  Substance Use Topics  . Alcohol use: Yes    Alcohol/week: 0.0 standard drinks    Comment: occasionally a couple of times a year   . Drug use: No     Allergies   Bee venom   Review of Systems Review of Systems  Constitutional: Negative for fever.  Eyes: Positive for visual disturbance.  Respiratory: Positive for cough and shortness of breath.   Cardiovascular: Positive for leg swelling. Negative for chest pain.  Genitourinary: Negative for dysuria.  Musculoskeletal: Positive for arthralgias.  Neurological: Positive for light-headedness. Negative for syncope and headaches.  All other systems reviewed and are negative.    Physical Exam Updated Vital Signs BP (!) 136/92 (BP Location: Left Arm)   Pulse (!) 127   Temp 97.9 F (36.6 C) (Oral)   Resp 17   Wt (!) 155.6 kg   SpO2 96%   BMI 52.15 kg/m   Physical Exam  Constitutional: He is oriented to person, place, and time. He appears well-developed and well-nourished.  Non-toxic appearance.  Morbidly obese  HENT:  Head: Normocephalic and atraumatic.  Right Ear: External ear normal.  Left Ear: External ear normal.  Nose: Nose normal.  Eyes: Pupils are equal, round, and reactive to light. EOM are normal. Right eye exhibits no discharge. Left eye exhibits no discharge.  Neck: Neck supple.  Cardiovascular: Normal heart sounds. An irregular rhythm present. Tachycardia present.  Pulmonary/Chest: Effort normal and breath sounds normal. No accessory muscle usage. He has no decreased breath sounds. He has no wheezes.  Abdominal: Soft. There is no tenderness.  Musculoskeletal:       Right lower leg: He exhibits edema (pitting).       Left lower  leg: He exhibits edema (pitting).  Neurological: He is alert and oriented to  person, place, and time.  CN 3-12 grossly intact. 5/5 strength in BUE. Limited strength due to pain (not weakness per patient) in BLE. Can move but not lift off stretcher Grossly normal sensation. Normal finger to nose.   Skin: Skin is warm and dry.  Nursing note and vitals reviewed.    ED Treatments / Results  Labs (all labs ordered are listed, but only abnormal results are displayed) Labs Reviewed  CBC WITH DIFFERENTIAL/PLATELET - Abnormal; Notable for the following components:      Result Value   RBC 3.08 (*)    Hemoglobin 9.0 (*)    HCT 29.9 (*)    All other components within normal limits  BASIC METABOLIC PANEL - Abnormal; Notable for the following components:   Glucose, Bld 109 (*)    BUN 32 (*)    Creatinine, Ser 2.31 (*)    Calcium 8.4 (*)    GFR calc non Af Amer 27 (*)    GFR calc Af Amer 31 (*)    All other components within normal limits  BRAIN NATRIURETIC PEPTIDE - Abnormal; Notable for the following components:   B Natriuretic Peptide 453.0 (*)    All other components within normal limits  CULTURE, BLOOD (ROUTINE X 2)  CULTURE, BLOOD (ROUTINE X 2)  URINE CULTURE  TROPONIN I  LACTIC ACID, PLASMA  URINALYSIS, ROUTINE W REFLEX MICROSCOPIC  LEGIONELLA PNEUMOPHILA SEROGP 1 UR AG    EKG EKG Interpretation  Date/Time:  Friday April 16 2018 12:29:55 EDT Ventricular Rate:  135 PR Interval:    QRS Duration: 88 QT Interval:  309 QTC Calculation: 464 R Axis:   66 Text Interpretation:  Atrial fibrillation with RVR Low voltage, extremity and precordial leads afib new with tachycardia since March 15 2018 Confirmed by Sherwood Gambler [phone]) on 04/16/2018 12:38:41 PM   Radiology Ct Head Wo Contrast  Result Date: 04/16/2018 CLINICAL DATA:  Shortness of breath on exertion for the past 3 days. Status post left hip replacement 3 weeks ago. Focal neural deficit for greater than 6 hours. EXAM: CT HEAD WITHOUT CONTRAST TECHNIQUE: Contiguous axial images were obtained from  the base of the skull through the vertex without intravenous contrast. COMPARISON:  Brain MR dated 01/18/2018 and head CT dated 05/11/2007. FINDINGS: Brain: Mildly enlarged ventricles and cortical sulci. Minimal patchy white matter low density in both cerebral hemispheres. No intracranial hemorrhage, mass lesion or CT evidence of acute infarction. Vascular: No hyperdense vessel or unexpected calcification. Skull: Mild bilateral hyperostosis frontalis. Sinuses/Orbits: Unremarkable. Other: None. IMPRESSION: 1. No acute abnormality. 2. Mild atrophy and minimal chronic small vessel white matter ischemic changes in both cerebral hemispheres. Electronically Signed   By: Claudie Revering M.D.   On: 04/16/2018 14:22   Dg Chest Portable 1 View  Result Date: 04/16/2018 CLINICAL DATA:  Shortness of breath. Recent left hip replacement 3 weeks ago. EXAM: PORTABLE CHEST 1 VIEW COMPARISON:  03/15/2018 FINDINGS: Lungs are adequately inflated demonstrate airspace consolidation over the right upper lobe with mild patchy hazy density over the left midlung and right mid to lower lung. Suggestion partial collapse right middle lobe. Small left effusion slightly worse. Stable cardiomegaly. Remainder the exam is unchanged. IMPRESSION: Airspace consolidation right upper lobe with minimal patchy hazy opacification left midlung and right base likely multifocal pneumonia. Slight worsening small left effusion. Partial collapse right middle lobe. Stable cardiomegaly. Electronically Signed   By: Marin Olp M.D.  On: 04/16/2018 12:55    Procedures .Critical Care Performed by: Sherwood Gambler, MD Authorized by: Sherwood Gambler, MD   Critical care provider statement:    Critical care time (minutes):  35   Critical care time was exclusive of:  Separately billable procedures and treating other patients   Critical care was necessary to treat or prevent imminent or life-threatening deterioration of the following conditions:  Respiratory  failure, circulatory failure and sepsis   Critical care was time spent personally by me on the following activities:  Development of treatment plan with patient or surrogate, discussions with consultants, evaluation of patient's response to treatment, examination of patient, obtaining history from patient or surrogate, ordering and performing treatments and interventions, ordering and review of laboratory studies, ordering and review of radiographic studies, pulse oximetry, re-evaluation of patient's condition and review of old charts   (including critical care time)  Medications Ordered in ED Medications  vancomycin (VANCOCIN) IVPB 1000 mg/200 mL premix (has no administration in time range)  ceFEPIme (MAXIPIME) 2 g in sodium chloride 0.9 % 100 mL IVPB (has no administration in time range)     Initial Impression / Assessment and Plan / ED Course  I have reviewed the triage vital signs and the nursing notes.  Pertinent labs & imaging results that were available during my care of the patient were reviewed by me and considered in my medical decision making (see chart for details).     Patient's atrial fibrillation appears to be a new issue today.  However he has been having the shortness of breath for about 3 days and so it is unclear exactly when the A. fib started and thus he is not eligible for ED cardioversion.  I also think the A. fib is probably related to his acute pneumonia.  He is not hypotensive but with the significant A. fib with RVR and his other complicating medical history I think he will need admission for healthcare associated pneumonia.  His vision changes are odd.  I do not think this represents a stroke but given the A. fib and other findings I did a CT of his head.  However it is more positive findings such as seeing people that are not there and thus is probably less likely to be stroke.  I did order an MRI after discussing with hospitalist but he does not fit in our MRI  machine.  Hospitalist will defer for now and treat other symptoms and see if this improves.  Otherwise patient is stable for admission to the hospitalist service.  Started on Cardizem drip.  Final Clinical Impressions(s) / ED Diagnoses   Final diagnoses:  HCAP (healthcare-associated pneumonia)  Atrial fibrillation with RVR ([place])  Acute kidney injury [facility])    ED Discharge Orders    None       Sherwood Gambler, MD 04/16/18 1546

## 2018-04-16 NOTE — H&P (Signed)
TRH H&P   Patient Demographics:    [name], is a 72 y.o. male  MRN: [id number]   DOB - 1945/11/14  Admit Date - 04/16/2018  Outpatient Primary MD for the patient is [name], MD  Referring MD: Dr. Regenia Skeeter  Outpatient Specialists: Dr. Reynaldo Minium (orthopedics), Dr. Johnnye Sima (ID)  Patient coming from: Home  Chief Complaint  Patient presents with  . Shortness of Breath      HPI:    [name]  is a 72 y.o. male, with morbid obesity, hypertension, obstructive sleep apnea on CPAP, giant cell arteritis on prednisone taper, chronic diplopia following?  Left eye muscle repair, who underwent left total hip arthroplasty with revision on 02/17/2018 and was discharged to SNF.  He developed postop hematoma and subsequently at SNF was febrile with purulent discharge from his wound with foul order 2 weeks later.  He had a PICC line placed in and was started on vancomycin and Zosyn.  He was unable to ambulate due to pain and then presented to the hospital on 7/15.  He underwent I&D of his wound on 7/17.  The wound culture grew E. coli and Enterobacter Aerogenous. He was discharged home on IV Rocephin 2 g daily for 6 weeks with end date of 04/28/2018 via PICC line. Patient presented to the ED today with increasing shortness of breath and weakness for past 3 days.  He reports poor p.o. intake.  Today he also was having visual hallucinations seeing people who were not there.  He denies headache, blurred vision, dizziness, reports some nausea but no vomiting.  Denies chest pain, palpitations, abdominal pain, dysuria or diarrhea.  He denies any discharge from his left area.  He reports that he has been feeling very weak for past 3 days and not able to ambulate in and around the house.  (Prior to 3 days ago he was able to ambulate in his house with some support).  He denies any sick contact or recent  travel.  ED he was afebrile, he was found to be in rapid A. fib with heart rate in 130s.  Respiratory rate in the 20s with stable blood pressure and normal O2 sat on room air.  Blood work showed WC of 7K, hemoglobin of 9 and normal platelets.  Chemistry showed findings of AKI with BUN of 32 and creatinine of 2.31.  Troponin was negative.  EKG showed A. fib with RVR. Lactic acid was normal.  Chest x-ray showed mild focal pneumonia involving right upper lobe, right lung base and left midlung.  Also showed small left pleural effusion and partial collapse of the right middle lobe.  Head CT negative for acute findings. Patient given empiric IV vancomycin and Zosyn, blood culture sent and hospitalist consulted for admission to stepdown unit.  He was also started on IV Cardizem drip.   Review of systems:  In addition to the HPI above,  No Fever-chills, No Headache, chronic diplopia without acute change in vision, no change in hearing Visual hallucination+ No problems swallowing food or Liquids, Shortness of breath, nonproductive cough, no chest pain No Abdominal pain, No Nausea or vomiting, Bowel movements are regular, No Blood in stool or Urine, No dysuria, No new skin rashes or bruises, No new joints pains-aches,  Generalized weakness+, tingling, numbness in any extremity, No recent weight gain or loss, No polyuria, polydypsia or polyphagia, No significant Mental Stressors.   With Past History of the following :    Past Medical History:  Diagnosis Date  . Anginal pain ([place]) 2006   evaluated by cardio  . Arthritis    knees,feet,shoulders,elbows.hands  . Constipation   . Dyspnea    with exertion   . Dysrhythmia    Atrial Flutter- 2006- corredted itself  . Family history of adverse reaction to anesthesia    mother- with novocaine went into shock  . GERD (gastroesophageal reflux disease)   . Headache   . Hemorrhoids   . History of blood transfusion   . Hypertension   . Insomnia    . Sleep apnea    cpap  . 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 APPENDECTOMY    . ARTERY BIOPSY Left 12/30/2017   Procedure: BIOPSY TEMPORAL ARTERY;  Surgeon: Fanny Skates, MD;  Location: [facility];  Service: General;  Laterality: Left;  . CHOLECYSTECTOMY    . COLONOSCOPY W/ POLYPECTOMY    . ESOPHAGOGASTRODUODENOSCOPY (EGD) WITH PROPOFOL  07/06/2012   Procedure: ESOPHAGOGASTRODUODENOSCOPY (EGD) WITH PROPOFOL;  Surgeon: Jeryl Columbia, MD;  Location: [facility];  Service: Endoscopy;  Laterality: N/A;  . ESOPHAGOSCOPY    . EYE SURGERY     left eye- muscle repair  . HERNIA REPAIR     ventral hernia  . INCISION AND DRAINAGE HIP Left 03/17/2018   Procedure: IRRIGATION AND DEBRIDEMENT LEFT THIGH WOUND;  Surgeon: Gaynelle Arabian, MD;  Location: [facility];  Service: Orthopedics;  Laterality: Left;  . JOINT REPLACEMENT     bilateral hips.  Right broke and had to be re placed again  . MASS EXCISION N/A 03/02/2015   Procedure: EXCISION ABDOMINAL WALL MASS;  Surgeon: Alphonsa Overall, MD;  Location: [facility];  Service: General;  Laterality: N/A;  . TOTAL HIP REVISION Left 02/17/2018   Procedure: Left total hip arthroplasty revision;  Surgeon: Gaynelle Arabian, MD;  Location: [facility];  Service: Orthopedics;  Laterality: Left;  Marland Kitchen VERTICAL BANDED GASTROPLASTY        Social History:     Social History   Tobacco Use  . Smoking status: Former Smoker    Packs/day: 2.00    Years: 10.00    Pack years: 20.00    Types: Cigarettes    Start date: 05/16/1966    Last attempt to quit: 03/11/1984    Years since quitting: 34.1  . Smokeless tobacco: Never Used  Substance Use Topics  . Alcohol use: Yes    Alcohol/week: 0.0 standard drinks    Comment: occasionally a couple of times a year      Lives -home alone Mobility -uses walker to ambulate    Family History :     Family History  Problem Relation Age of Onset  . Cancer Mother   . Alcohol  abuse Father       Home Medications:   Prior to Admission medications  Medication Sig Start Date End Date Taking? Authorizing Provider  amitriptyline (ELAVIL) 25 MG tablet Take 25 mg by mouth at bedtime.   Yes [provider]  amLODipine (NORVASC) 10 MG tablet Take 10 mg by mouth daily.   Yes [provider]  celecoxib (CELEBREX) 200 MG capsule Take 1 capsule by mouth 2 (two) times daily. 01/19/18  Yes [provider]  cycloSPORINE (RESTASIS) 0.05 % ophthalmic emulsion Place 1 drop into both eyes 2 (two) times daily.    Yes [provider]  enoxaparin (LOVENOX) 40 MG/0.4ML injection Inject 0.4 mLs (40 mg total) into the skin daily. Inject Lovenox once a day for 10 days after surgery followed by 3 weeks of a baby aspirin once a day. 03/24/18  Yes Edmisten, Kristie L, PA  furosemide (LASIX) 20 MG tablet Take 1 tablet (20 mg total) by mouth daily as needed for fluid or edema (as needed for edema.). 02/23/18  Yes Arrien, Jimmy Picket, MD  gabapentin (NEURONTIN) 300 MG capsule Take 900 mg by mouth at bedtime.    Yes [provider]  gabapentin (NEURONTIN) 300 MG capsule Take 900 mg by mouth at bedtime.   Yes [provider]  gabapentin (NEURONTIN) 600 MG tablet Take 300 mg by mouth daily.    Yes [provider]  methadone (DOLOPHINE) 10 MG tablet Take 1 tablet (10 mg total) by mouth 4 (four) times daily. 03/23/18  Yes Edmisten, Kristie L, PA  methocarbamol (ROBAXIN) 750 MG tablet Take 750 mg by mouth every 6 (six) hours.    Yes [provider]  mometasone (ELOCON) 0.1 % cream Apply 1 application topically daily as needed. 02/09/18  Yes [provider]  mupirocin ointment (BACTROBAN) 2 % Apply 1 application topically 3 (three) times daily. 10/22/17 10/22/18 Yes [provider]  nystatin (MYCOSTATIN/NYSTOP) powder Apply 1 g topically daily. Apply to right and left abdominal folds topically every day shift for rash    Yes [provider]  omeprazole (PRILOSEC) 40 MG capsule Take 40 mg by mouth every evening.    Yes [provider]  ondansetron (ZOFRAN) 4 MG tablet Take 1 tablet (4 mg total) by mouth every 6 (six) hours as needed for nausea. 03/23/18  Yes Edmisten, Kristie L, PA  oxyCODONE (OXY IR/ROXICODONE) 5 MG immediate release tablet Take 5 mg by mouth every 6 (six) hours as needed for severe pain.   Yes [provider]  predniSONE (DELTASONE) 2.5 MG tablet Take 3 tablets (7.5 mg total) by mouth 2 (two) times daily with a meal. 03/23/18  Yes Edmisten, Kristie L, PA  solifenacin (VESICARE) 10 MG tablet Take 10 mg by mouth daily.   Yes [provider]  Testosterone Propionate POWD Apply 0.5 mLs topically as directed. Apply 2 clicks (9.9IP) once a day as directed 01/05/18  Yes [provider]  venlafaxine XR (EFFEXOR-XR) 150 MG 24 hr capsule Take 300 mg by mouth daily with breakfast.   Yes [provider]  acetaminophen (TYLENOL) 325 MG tablet Take 650 mg by mouth every 12 (twelve) hours.    [provider]  aspirin EC 81 MG tablet Take 1 tablet by mouth daily.    [provider]  bisacodyl (DULCOLAX) 10 MG suppository Place 1 suppository (10 mg total) rectally daily as needed for moderate constipation. 03/23/18   Edmisten, Kristie L, PA  cefTRIAXone (ROCEPHIN) IVPB Inject 2 g into the vein daily. Indication:  Prosthetic joint nfection Last Day of Therapy:  04/28/18 Labs -  Once weekly:  CBC/D and BMP, Labs - Every other week:  ESR and CRP 03/23/18 04/29/18  Edmisten, Kristie L, PA  Cholecalciferol (VITAMIN D3) 2000 units TABS Take 2,000 Units by mouth every evening.     [provider]  diphenhydrAMINE (BENADRYL) 25 MG tablet Take 25-50 mg by mouth every 8 (eight) hours as needed for allergies (bee stings).     [provider]  docusate sodium (COLACE) 100 MG capsule Take 1 capsule (100 mg total) by mouth 2 (two) times daily.  03/23/18   Edmisten, Kristie L, PA  famotidine (PEPCID) 20 MG tablet Take 1 tablet by mouth daily. 04/09/18   [provider]  Multiple Vitamins-Minerals (CENTRUM ADULTS PO) Take 1 tablet by mouth every evening.     [provider]  polyethylene glycol (MIRALAX / GLYCOLAX) packet Take 17 g by mouth daily as needed for mild constipation. 02/23/18   Arrien, Jimmy Picket, MD  PRESCRIPTION MEDICATION Place 1 application onto the skin at bedtime. Testosterone Cream 10%    [provider]  Skin Protectants, Misc. (EUCERIN) cream Apply 1 application topically daily.    [provider]     Allergies:     Allergies  Allergen Reactions  . Bee Venom Swelling     Physical Exam:   Vitals  Blood pressure 100/65, pulse (!) 123, temperature 97.9 F (36.6 C), temperature source Oral, resp. rate 19, weight (!) 155.6 kg, SpO2 97 %.   General: Elderly morbidly obese male in no acute distress, appears fatigued HEENT: Pupils reactive bilaterally, EOMI, pallor present, no icterus, moist oral mucosa, supple neck, no cervical lymphadenopathy Chest: Diminished bilateral breath sounds due to body habitus CVs: S1-S2 irregularly irregular, no murmurs rub or gallop GI: Soft, nondistended, nontender, bowel sounds present Muscular skeletal: Warm, 1+ pitting edema bilaterally, left hip appears clean. CNS: Alert and oriented, normal affect.  No focal deficit.   Data Review:    CBC Recent Labs  Lab 04/16/18 1248  WBC 7.0  HGB 9.0*  HCT 29.9*  PLT 172  MCV 97.1  MCH 29.2  MCHC 30.1  RDW 14.9  LYMPHSABS 0.7  MONOABS 0.9  EOSABS 0.1  BASOSABS 0.0   ------------------------------------------------------------------------------------------------------------------  Chemistries  Recent Labs  Lab 04/16/18 1248  NA 140  K 4.4  CL 105  CO2 25  GLUCOSE 109*  BUN 32*  CREATININE 2.31*  CALCIUM 8.4*    ------------------------------------------------------------------------------------------------------------------ estimated creatinine clearance is 42.9 mL/min (A) (by C-G formula based on SCr of 2.31 mg/dL (H)). ------------------------------------------------------------------------------------------------------------------ No results for input(s): TSH, T4TOTAL, T3FREE, THYROIDAB in the last 72 hours.  Invalid input(s): FREET3  Coagulation profile No results for input(s): INR, PROTIME in the last 168 hours. ------------------------------------------------------------------------------------------------------------------- No results for input(s): DDIMER in the last 72 hours. -------------------------------------------------------------------------------------------------------------------  Cardiac Enzymes Recent Labs  Lab 04/16/18 1248  TROPONINI <0.03   ------------------------------------------------------------------------------------------------------------------    Component Value Date/Time   BNP 453.0 (H) 04/16/2018 1248     ---------------------------------------------------------------------------------------------------------------  Urinalysis    Component Value Date/Time   COLORURINE YELLOW 03/15/2018 1607   APPEARANCEUR CLEAR 03/15/2018 1607   APPEARANCEUR Clear 01/18/2018 1539   LABSPEC 1.006 03/15/2018 1607   PHURINE 7.0 03/15/2018 1607   GLUCOSEU NEGATIVE 03/15/2018 1607   HGBUR NEGATIVE 03/15/2018 1607   [place] 03/15/2018 1607   BILIRUBINUR Negative 01/18/2018 [place] 03/15/2018 1607   PROTEINUR NEGATIVE 03/15/2018 1607   UROBILINOGEN 0.2 09/06/2012 0213   NITRITE NEGATIVE 03/15/2018 1607   LEUKOCYTESUR NEGATIVE  03/15/2018 1607   LEUKOCYTESUR Negative 01/18/2018 1539    ----------------------------------------------------------------------------------------------------------------   Imaging Results:    Ct Head Wo  Contrast  Result Date: 04/16/2018 CLINICAL DATA:  Shortness of breath on exertion for the past 3 days. Status post left hip replacement 3 weeks ago. Focal neural deficit for greater than 6 hours. EXAM: CT HEAD WITHOUT CONTRAST TECHNIQUE: Contiguous axial images were obtained from the base of the skull through the vertex without intravenous contrast. COMPARISON:  Brain MR dated 01/18/2018 and head CT dated 05/11/2007. FINDINGS: Brain: Mildly enlarged ventricles and cortical sulci. Minimal patchy white matter low density in both cerebral hemispheres. No intracranial hemorrhage, mass lesion or CT evidence of acute infarction. Vascular: No hyperdense vessel or unexpected calcification. Skull: Mild bilateral hyperostosis frontalis. Sinuses/Orbits: Unremarkable. Other: None. IMPRESSION: 1. No acute abnormality. 2. Mild atrophy and minimal chronic small vessel white matter ischemic changes in both cerebral hemispheres. Electronically Signed   By: Claudie Revering M.D.   On: 04/16/2018 14:22   Dg Chest Portable 1 View  Result Date: 04/16/2018 CLINICAL DATA:  Shortness of breath. Recent left hip replacement 3 weeks ago. EXAM: PORTABLE CHEST 1 VIEW COMPARISON:  03/15/2018 FINDINGS: Lungs are adequately inflated demonstrate airspace consolidation over the right upper lobe with mild patchy hazy density over the left midlung and right mid to lower lung. Suggestion partial collapse right middle lobe. Small left effusion slightly worse. Stable cardiomegaly. Remainder the exam is unchanged. IMPRESSION: Airspace consolidation right upper lobe with minimal patchy hazy opacification left midlung and right base likely multifocal pneumonia. Slight worsening small left effusion. Partial collapse right middle lobe. Stable cardiomegaly. Electronically Signed   By: Marin Olp M.D.   On: 04/16/2018 12:55    My personal review of EKG: A. fib with RVR at 135 bpm.  No ST-T changes.   Assessment & Plan:    Principal Problem:    Atrial fibrillation with RVR ([place]) Suspect due to SIRS with multifocal pneumonia.  Started on Cardizem drip in the ED.  Check TSH and 2D echo.  Cardiology consult if no clinical improvement.  Active symptoms SIRS (systemic inflammatory response syndrome) ([place]) Healthcare associated pneumonia ([place]) Has multifocal pneumonia on chest x-ray.  Placed on empiric IV vancomycin and cefepime.  Follow blood culture, strep and Legionella antigen.  Will add PRN Robitussin.  O2 via nasal cannula.  Acute kidney injury ([place]) Possibly prerenal due to poor p.o. intake.  Will monitor with gentle hydration.  Avoid nephrotoxins.     Visual hallucination New findings.  Possibly due to underlying infection.  Monitor for now.  Is also on multiple pain medications which should be used with caution.    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 Status post I&D on 7/17 with cultures growing E. coli and Enterobacter erogenous.  On IV Rocephin for 6 weeks until 8/28.  Both sensitive to cefepime which should cover while inpatient.  Obstructive sleep apnea Continue CPAP.    Morbid obesity with BMI of 50.0-59.9, adult ([place])   Diplopia Reports this to be chronic following eye surgery.  Head CT unremarkable.      Anemia of chronic disease Chronic.  Monitor.  Temporal arteritis Reportedly on tapering dose of prednisone.  Continue for now.    DVT Prophylaxis: Subcu heparin  AM Labs Ordered, also please review Full Orders  Family Communication: Admission, patients condition and plan of care including tests being ordered have been discussed with the patient at bedside  CODE STATUS: Full code  Likely DC to home  Condition GUARDED    Consults called: None  Admission status: Inpatient.  Patient meets criteria for inpatient with acute presentation of healthcare associated pneumonia requiring inpatient IV antibiotics, A. fib with RVR requiring Cardizem drip with stepdown  monitoring, acute kidney injury requiring IV fluids and will require inpatient monitoring for more than 2 midnights.  Time spent in minutes : 70   Sharnell Knight M.D on 04/16/2018 at 3:34 PM  Between 7am to 7pm - Pager - [phone]. After 7pm go to [email or web address] - password [facility]  [facility] - Office  [phone]

## 2018-04-16 NOTE — ED Triage Notes (Signed)
Pt brought in by EMS due to SOB upon exertion x 3 days. Pt had left hip replacement 3 weeks ago. Pt discharged home. Has picc line with IV rocephin adm by [facility] for osteomylitis

## 2018-04-17 ENCOUNTER — Inpatient Hospital Stay (HOSPITAL_COMMUNITY): Payer: Medicare Other

## 2018-04-17 DIAGNOSIS — I4891 Unspecified atrial fibrillation: Secondary | ICD-10-CM

## 2018-04-17 LAB — CBC
HCT: 26.6 % — ABNORMAL LOW (ref 39.0–52.0)
Hemoglobin: 8 g/dL — ABNORMAL LOW (ref 13.0–17.0)
MCH: 29.2 pg (ref 26.0–34.0)
MCHC: 30.1 g/dL (ref 30.0–36.0)
MCV: 97.1 fL (ref 78.0–100.0)
Platelets: 175 10*3/uL (ref 150–400)
RBC: 2.74 MIL/uL — ABNORMAL LOW (ref 4.22–5.81)
RDW: 15 % (ref 11.5–15.5)
WBC: 5.3 10*3/uL (ref 4.0–10.5)

## 2018-04-17 LAB — BASIC METABOLIC PANEL
Anion gap: 6 (ref 5–15)
BUN: 27 mg/dL — ABNORMAL HIGH (ref 8–23)
CO2: 26 mmol/L (ref 22–32)
Calcium: 8.1 mg/dL — ABNORMAL LOW (ref 8.9–10.3)
Chloride: 108 mmol/L (ref 98–111)
Creatinine, Ser: 1.64 mg/dL — ABNORMAL HIGH (ref 0.61–1.24)
GFR calc Af Amer: 47 mL/min — ABNORMAL LOW (ref >60)
GFR calc non Af Amer: 40 mL/min — ABNORMAL LOW (ref >60)
Glucose, Bld: 89 mg/dL (ref 70–99)
Potassium: 4.1 mmol/L (ref 3.5–5.1)
Sodium: 140 mmol/L (ref 135–145)

## 2018-04-17 LAB — ECHOCARDIOGRAM COMPLETE
Height: 68 in
Weight: 5954.18 oz

## 2018-04-17 LAB — URINE CULTURE

## 2018-04-17 LAB — MRSA PCR SCREENING: MRSA by PCR: NEGATIVE

## 2018-04-17 LAB — HIV ANTIBODY (ROUTINE TESTING W REFLEX): HIV Screen 4th Generation wRfx: NONREACTIVE

## 2018-04-17 MED ORDER — SODIUM CHLORIDE 0.9% FLUSH
10.0000 mL | Freq: Two times a day (BID) | INTRAVENOUS | Status: DC
  Administered 2018-04-17 – 2018-04-18 (×4): 10 mL
  Administered 2018-04-19: 20 mL
  Administered 2018-04-19 – 2018-04-23 (×9): 10 mL

## 2018-04-17 MED ORDER — SODIUM CHLORIDE 0.9% FLUSH: 10.0000 mL | INTRAVENOUS | Status: DC | PRN

## 2018-04-17 MED ORDER — PREDNISONE 5 MG PO TABS
7.5000 mg | ORAL_TABLET | Freq: Two times a day (BID) | ORAL | Status: DC
  Administered 2018-04-17 – 2018-04-27 (×20): 7.5 mg via ORAL
  Filled 2018-04-17 (×5): qty 1.5
  Filled 2018-04-17: qty 2
  Filled 2018-04-17 (×6): qty 1.5
  Filled 2018-04-17: qty 2
  Filled 2018-04-17 (×5): qty 1.5
  Filled 2018-04-17 (×2): qty 2
  Filled 2018-04-17 (×2): qty 1.5
  Filled 2018-04-17: qty 2
  Filled 2018-04-17: qty 1.5

## 2018-04-17 MED ORDER — CHLORHEXIDINE GLUCONATE CLOTH 2 % EX PADS
6.0000 | MEDICATED_PAD | Freq: Every day | CUTANEOUS | Status: DC
  Administered 2018-04-17 – 2018-04-23 (×7): 6 via TOPICAL

## 2018-04-17 MED ORDER — SODIUM CHLORIDE 0.9 % IV SOLN
2.0000 g | Freq: Two times a day (BID) | INTRAVENOUS | Status: DC
  Administered 2018-04-17 – 2018-04-19 (×4): 2 g via INTRAVENOUS
  Filled 2018-04-17 (×7): qty 2

## 2018-04-17 MED ORDER — ORAL CARE MOUTH RINSE
15.0000 mL | Freq: Two times a day (BID) | OROMUCOSAL | Status: DC
Start: 2018-04-17 — End: 2018-04-27
  Administered 2018-04-18 – 2018-04-27 (×11): 15 mL via OROMUCOSAL

## 2018-04-17 MED ORDER — PERFLUTREN LIPID MICROSPHERE
1.0000 mL | INTRAVENOUS | Status: AC | PRN
  Administered 2018-04-17: 2 mL via INTRAVENOUS
  Filled 2018-04-17: qty 10

## 2018-04-17 NOTE — Progress Notes (Signed)
*  PRELIMINARY RESULTS* Echocardiogram 2D Echocardiogram with definity has been performed.  [name] 04/17/2018, 11:43 AM

## 2018-04-17 NOTE — Progress Notes (Signed)
PROGRESS NOTE                                                                                                                                                                                                             Patient Demographics:    Braedon Sjogren, is a 72 y.o. male, DOB - 12/12/1945, DJS:[id number]  Admit date - 04/16/2018   Admitting Physician Louellen Molder, MD  Outpatient Primary MD for the patient is [name], MD  LOS - 1  Outpatient Specialists: Dr Maureen Ralphs, Dr Johnnye Sima  Chief Complaint  Patient presents with  . 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  Wound culture grew E. coli and Enterobacter aerogenous for which she was discharged home on IV Rocephin for 6 weeks duration (stop date 04/28/2018). Patient presented to the ED with shortness of breath and weakness for past 3 days.  Other symptoms include visual hallucinations, poor p.o. intake and decreased mobility. In the ED he was found to be in rapid A. fib, tachypneic and an acute kidney injury. Chest x-ray showed multilobar pneumonia. Admitted to stepdown unit for SIRS with Healthcare associated pneumonia and new onset A. fib with RVR.    Subjective:   Maintained on Cardizem drip overnight.  Denies any shortness of breath or chest pain symptoms.  Heart rate better controlled today.   Assessment  & Plan :    Principal Problem:   Atrial fibrillation with RVR (HCC) Heart rate better controlled on Cardizem drip.  Will wean as tolerated.  TSH normal.  2D echo with mildly diffusely decreased  LVEF of 50-55% possibly due to rapid A. fib..  Continue stepdown monitoring. CHADS2 vasc of 1 (age).  Rapid A. fib possibly due to infection.  Patient on daily aspirin..  Active Problems:    Active symptoms SIRS (systemic inflammatory response syndrome) ([place]) Healthcare associated pneumonia ([place])  multifocal pneumonia on chest x-ray.  Placed on empiric IV vancomycin and cefepime.    Vitals stable. Follow blood culture, strep and Legionella antigen.   PRN  Robitussin.  O2 via nasal cannula.  Acute kidney injury ([place]) Possibly prerenal due to poor p.o. intake.    Improved with IV fluids in a.m. lab.  Will discontinue fluids given increasing leg swelling.  If renal function stable will start low-dose daily Lasix tomorrow.     Visual hallucination New findings.  Possibly due to underlying infection.  Monitor for now.  Is also on multiple pain medications and I will discontinue his oxycodone (reports has not been taking them), reduced gabapentin dose and discontinued amitriptyline.    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 Status post I&D on 7/17 with cultures growing E. coli and Enterobacter erogenous.  On IV Rocephin for 6 weeks until 8/28.  Both sensitive to cefepime which should cover while inpatient.  Obstructive sleep apnea Continue CPAP.    Morbid obesity with BMI of 50.0-59.9, adult ([place])   Diplopia Reports this to be chronic following eye surgery.  Head CT unremarkable.      Anemia of chronic disease Chronic.  Monitor.  Temporal arteritis Reportedly on tapering dose of prednisone.  Continue for now.      Code Status : Full code  Family Communication  : None at bedside  Disposition Plan  : Pending hospital course and clinical improvement  Barriers For Discharge : Active symptoms  Consults  : None  Procedures  : 2D echo  DVT Prophylaxis  : Subcu heparin  Lab Results  Component Value Date   PLT 175 04/17/2018     Antibiotics  :    Anti-infectives (From admission, onward)   Start     Dose/Rate Route Frequency Ordered Stop   04/17/18 1400  vancomycin (VANCOCIN) 1,750 mg in sodium chloride 0.9 % 500 mL IVPB     1,750 mg 250 mL/hr over 120 Minutes Intravenous Every 24 hours 04/16/18 1703     04/17/18 1300  ceFEPIme (MAXIPIME) 2 g in sodium chloride 0.9 % 100 mL IVPB  Status:  Discontinued     2 g 200 mL/hr over 30 Minutes Intravenous Every 24 hours 04/16/18 1703 04/17/18 1257   04/17/18 1300  ceFEPIme (MAXIPIME) 2 g in sodium chloride 0.9 % 100 mL IVPB     2 g 200 mL/hr over 30 Minutes Intravenous Every 12 hours 04/17/18 1257     04/16/18 1400  vancomycin (VANCOCIN) 2,000 mg in sodium chloride 0.9 % 500 mL IVPB     2,000 mg 250 mL/hr over 120 Minutes Intravenous  Once 04/16/18 1315 04/16/18 1657   04/16/18 1315  vancomycin (VANCOCIN) IVPB 1000 mg/200 mL premix  Status:  Discontinued     1,000 mg 200 mL/hr over 60 Minutes Intravenous  Once 04/16/18 1304 04/16/18 1314   04/16/18 1315  ceFEPIme (MAXIPIME) 2 g in sodium chloride 0.9 % 100 mL IVPB     2 g 200 mL/hr over 30 Minutes Intravenous  Once 04/16/18 1304 04/16/18 1419        Objective:   Vitals:   04/17/18 0736 04/17/18 1100 04/17/18 1200 04/17/18 1300  BP:   102/79 106/73  Pulse: (!) 128 (!) 162 (!) 124 (!) 130  Resp: 17 15 20 19   Temp: 97.9 F (36.6 C)     TempSrc: Oral     SpO2: 92% 94% 91% 91%  Weight:      Height:        Wt Readings from Last 3 Encounters:  04/16/18 (!) 168.8 kg  03/17/18 (!) 155.6 kg  02/20/18 (!) 156 kg  Intake/Output Summary (Last 24 hours) at 04/17/2018 1431 Last data filed at 04/17/2018 7616 Gross per 24 hour  Intake 2575.83 ml  Output -  Net 2575.83 ml     Physical Exam  Gen: Billy obese male not in distress HEENT: no pallor, moist mucosa, supple neck Chest: Diminished breath sounds due to body habitus CVS: S1 and S2 irregularly irregular, no murmurs rub or gallop GI: soft, NT,  ND, BS+ Musculoskeletal: warm, trace pitting edema bilaterally    Data Review:    CBC Recent Labs  Lab 04/16/18 1248 04/17/18 0408  WBC 7.0 5.3  HGB 9.0* 8.0*  HCT 29.9* 26.6*  PLT 172 175  MCV 97.1 97.1  MCH 29.2 29.2  MCHC 30.1 30.1  RDW 14.9 15.0  LYMPHSABS 0.7  --   MONOABS 0.9  --   EOSABS 0.1  --   BASOSABS 0.0  --     Chemistries  Recent Labs  Lab 04/16/18 1248 04/17/18 0408  NA 140 140  K 4.4 4.1  CL 105 108  CO2 25 26  GLUCOSE 109* 89  BUN 32* 27*  CREATININE 2.31* 1.64*  CALCIUM 8.4* 8.1*   ------------------------------------------------------------------------------------------------------------------ No results for input(s): CHOL, HDL, LDLCALC, TRIG, CHOLHDL, LDLDIRECT in the last 72 hours.  No results found for: HGBA1C ------------------------------------------------------------------------------------------------------------------ Recent Labs    04/16/18 1237  TSH 1.118   ------------------------------------------------------------------------------------------------------------------ Recent Labs    04/16/18 1329  VITAMINB12 1,113*    Coagulation profile No results for input(s): INR, PROTIME in the last 168 hours.  No results for input(s): DDIMER in the last 72 hours.  Cardiac Enzymes Recent Labs  Lab 04/16/18 1248  TROPONINI <0.03   ------------------------------------------------------------------------------------------------------------------    Component Value Date/Time   BNP 453.0 (H) 04/16/2018 1248    Inpatient Medications  Scheduled Meds: . aspirin EC  81 mg Oral Daily  . Chlorhexidine Gluconate Cloth  6 each Topical Daily  . cholecalciferol  2,000 Units Oral QPM  . cycloSPORINE  1 drop Both Eyes BID  . darifenacin  15 mg Oral Daily  . docusate sodium  100 mg Oral BID  . famotidine  20 mg Oral Daily  . gabapentin  300 mg Oral Daily  . heparin  5,000 Units Subcutaneous Q8H  . mouth rinse  15 mL Mouth Rinse  BID  . methadone  10 mg Oral QID  . nystatin  1 g Topical Daily  . pantoprazole  40 mg Oral Daily  . predniSONE  7.5 mg Oral BID WC  . sodium chloride flush  10-40 mL Intracatheter Q12H  . Testosterone Propionate  0.5 mL Topical UD  . venlafaxine XR  300 mg Oral Q breakfast   Continuous Infusions: . sodium chloride 100 mL/hr at 04/17/18 0627  . ceFEPime (MAXIPIME) IV    . diltiazem (CARDIZEM) infusion 15 mg/hr (04/17/18 0801)  . vancomycin     PRN Meds:.acetaminophen **OR** acetaminophen, bisacodyl, furosemide, ondansetron **OR** ondansetron (ZOFRAN) IV, polyethylene glycol, sodium chloride flush  Micro Results Recent Results (from the past 240 hour(s))  Blood Culture (routine x 2)     Status: None (Preliminary result)   Collection Time: 04/16/18  1:23 PM  Result Value Ref Range Status   Specimen Description BLOOD  Final   Special Requests NONE  Final   Culture   Final    NO GROWTH < 24 HOURS Performed at [facility], [address] [place], [place] [address]    Report Status PENDING  Incomplete  Blood Culture (routine x  2)     Status: None (Preliminary result)   Collection Time: 04/16/18  1:29 PM  Result Value Ref Range Status   Specimen Description BLOOD  Final   Special Requests NONE  Final   Culture   Final    NO GROWTH < 24 HOURS Performed at [facility], [address] [place], [place] [address]    Report Status PENDING  Incomplete  MRSA PCR Screening     Status: None   Collection Time: 04/16/18  5:30 PM  Result Value Ref Range Status   MRSA by PCR NEGATIVE NEGATIVE Final    Comment:        The GeneXpert MRSA Assay (FDA approved for NASAL specimens only), is one component of a comprehensive MRSA colonization surveillance program. It is not intended to diagnose MRSA infection nor to guide or monitor treatment for MRSA infections. Performed at [facility], [address], [place], [place] [address]     Radiology Reports Ct Head Wo Contrast  Result  Date: 04/16/2018 CLINICAL DATA:  Shortness of breath on exertion for the past 3 days. Status post left hip replacement 3 weeks ago. Focal neural deficit for greater than 6 hours. EXAM: CT HEAD WITHOUT CONTRAST TECHNIQUE: Contiguous axial images were obtained from the base of the skull through the vertex without intravenous contrast. COMPARISON:  Brain MR dated 01/18/2018 and head CT dated 05/11/2007. FINDINGS: Brain: Mildly enlarged ventricles and cortical sulci. Minimal patchy white matter low density in both cerebral hemispheres. No intracranial hemorrhage, mass lesion or CT evidence of acute infarction. Vascular: No hyperdense vessel or unexpected calcification. Skull: Mild bilateral hyperostosis frontalis. Sinuses/Orbits: Unremarkable. Other: None. IMPRESSION: 1. No acute abnormality. 2. Mild atrophy and minimal chronic small vessel white matter ischemic changes in both cerebral hemispheres. Electronically Signed   By: Claudie Revering M.D.   On: 04/16/2018 14:22   Dg Chest Portable 1 View  Result Date: 04/16/2018 CLINICAL DATA:  Shortness of breath. Recent left hip replacement 3 weeks ago. EXAM: PORTABLE CHEST 1 VIEW COMPARISON:  03/15/2018 FINDINGS: Lungs are adequately inflated demonstrate airspace consolidation over the right upper lobe with mild patchy hazy density over the left midlung and right mid to lower lung. Suggestion partial collapse right middle lobe. Small left effusion slightly worse. Stable cardiomegaly. Remainder the exam is unchanged. IMPRESSION: Airspace consolidation right upper lobe with minimal patchy hazy opacification left midlung and right base likely multifocal pneumonia. Slight worsening small left effusion. Partial collapse right middle lobe. Stable cardiomegaly. Electronically Signed   By: Marin Olp M.D.   On: 04/16/2018 12:55   [place] Ekg Site Rite  Result Date: 03/20/2018 If Site Rite image not attached, placement could not be confirmed due to current cardiac  rhythm.   Time Spent in minutes  35   Zakery Normington M.D on 04/17/2018 at 2:31 PM  Between 7am to 7pm - Pager - [phone]  After 7pm go to [email or web address] - password [employer] [facility]  [facility] -  Office  [phone]

## 2018-04-18 LAB — CBC
HCT: 27.2 % — ABNORMAL LOW (ref 39.0–52.0)
Hemoglobin: 8 g/dL — ABNORMAL LOW (ref 13.0–17.0)
MCH: 28.8 pg (ref 26.0–34.0)
MCHC: 29.4 g/dL — ABNORMAL LOW (ref 30.0–36.0)
MCV: 97.8 fL (ref 78.0–100.0)
Platelets: 184 10*3/uL (ref 150–400)
RBC: 2.78 MIL/uL — ABNORMAL LOW (ref 4.22–5.81)
RDW: 14.9 % (ref 11.5–15.5)
WBC: 5.3 10*3/uL (ref 4.0–10.5)

## 2018-04-18 LAB — BASIC METABOLIC PANEL
Anion gap: 5 (ref 5–15)
BUN: 23 mg/dL (ref 8–23)
CO2: 26 mmol/L (ref 22–32)
Calcium: 8.1 mg/dL — ABNORMAL LOW (ref 8.9–10.3)
Chloride: 110 mmol/L (ref 98–111)
Creatinine, Ser: 1.38 mg/dL — ABNORMAL HIGH (ref 0.61–1.24)
GFR calc Af Amer: 58 mL/min — ABNORMAL LOW (ref >60)
GFR calc non Af Amer: 50 mL/min — ABNORMAL LOW (ref >60)
Glucose, Bld: 151 mg/dL — ABNORMAL HIGH (ref 70–99)
Potassium: 4.2 mmol/L (ref 3.5–5.1)
Sodium: 141 mmol/L (ref 135–145)

## 2018-04-18 LAB — LEGIONELLA PNEUMOPHILA SEROGP 1 UR AG: L. pneumophila Serogp 1 Ur Ag: NEGATIVE

## 2018-04-18 MED ORDER — FUROSEMIDE 40 MG PO TABS
40.0000 mg | ORAL_TABLET | Freq: Every day | ORAL | Status: DC
  Administered 2018-04-18 – 2018-04-20 (×3): 40 mg via ORAL
  Filled 2018-04-18 (×3): qty 1

## 2018-04-18 NOTE — Progress Notes (Signed)
PROGRESS NOTE                                                                                                                                                                                                             Patient Demographics:    [name], is a 72 y.o. male, DOB - 1945-09-15, VVO:[id number]  Admit date - 04/16/2018   Admitting Physician Louellen Molder, MD  Outpatient Primary MD for the patient is [name], MD  LOS - 2  Outpatient Specialists: Dr Maureen Ralphs, Dr Johnnye Sima  Chief Complaint  Patient presents with  . 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  Wound culture grew E. coli and Enterobacter aerogenous for which she was discharged home on IV Rocephin for 6 weeks duration (stop date 04/28/2018). Patient presented to the ED with shortness of breath and weakness for past 3 days.  Other symptoms include visual hallucinations, poor p.o. intake and decreased mobility. In the ED he was found to be in rapid A. fib, tachypneic and an acute kidney injury. Chest x-ray showed multilobar pneumonia. Admitted to stepdown unit for SIRS with Healthcare associated pneumonia and new onset A. fib with RVR.    Subjective:   Cardizem drip being tapered down overnight to 5 mg/h but heart rate now jumps to 120s.  Patient reports his breathing to be better.  Assessment  & Plan :    Principal Problem:   Atrial fibrillation with RVR (HCC) Continue Cardizem drip (will increase to 7 mg/h as heart rate in 120s)..  TSH normal.  2D echo with mildly diffusely decreased  LVEF of 50-55% possibly due to rapid A. fib..  Continue stepdown monitoring. CHADS2 vasc of 1 (age).  Rapid A. fib possibly due to infection.  Patient on daily aspirin..  Active Problems:    Active symptoms SIRS (systemic inflammatory response syndrome) ([place]) Healthcare associated pneumonia ([place])  multifocal pneumonia on chest x-ray.    Continue empiric IV vancomycin and cefepime.  Negative blood culture, strep and Legionella antigen pending.  PRN Robitussin.  O2 via nasal cannula.  Acute kidney injury ([place]) Possibly prerenal due to poor p.o. intake.    Improving in a.m. labs.  Fluids discontinued.  I will place him on daily oral Lasix given increasing leg swelling.       Visual hallucination New findings.  Possibly due to underlying infection.    On multiple medications which have been adjusted/discontinued.  Denies further symptoms at present.    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 Status post I&D on 7/17 with cultures growing E. coli and Enterobacter erogenous.  On IV Rocephin for 6 weeks until 8/28.  Both sensitive to cefepime which should cover while inpatient.  Obstructive sleep apnea Continue CPAP.    Morbid obesity with BMI of 50.0-59.9, adult ([place])   Diplopia Reports this to be chronic following eye surgery.  Head CT unremarkable.      Anemia of chronic disease Chronic.  Monitor.  Temporal arteritis Reportedly on tapering dose of prednisone.  Continue for now.      Code Status : Full code  Family Communication  : None at bedside  Disposition Plan  : Pending hospital course and clinical improvement  Barriers For Discharge : Active symptoms.  Continue stepdown monitoring while still requiring Cardizem drip.  Consults  : None  Procedures  : 2D echo  DVT Prophylaxis  : Subcu heparin  Lab Results  Component Value Date   PLT 184 04/18/2018    Antibiotics  :    Anti-infectives (From admission, onward)   Start      Dose/Rate Route Frequency Ordered Stop   04/17/18 1400  vancomycin (VANCOCIN) 1,750 mg in sodium chloride 0.9 % 500 mL IVPB     1,750 mg 250 mL/hr over 120 Minutes Intravenous Every 24 hours 04/16/18 1703     04/17/18 1300  ceFEPIme (MAXIPIME) 2 g in sodium chloride 0.9 % 100 mL IVPB  Status:  Discontinued     2 g 200 mL/hr over 30 Minutes Intravenous Every 24 hours 04/16/18 1703 04/17/18 1257   04/17/18 1300  ceFEPIme (MAXIPIME) 2 g in sodium chloride 0.9 % 100 mL IVPB     2 g 200 mL/hr over 30 Minutes Intravenous Every 12 hours 04/17/18 1257     04/16/18 1400  vancomycin (VANCOCIN) 2,000 mg in sodium chloride 0.9 % 500 mL IVPB     2,000 mg 250 mL/hr over 120 Minutes Intravenous  Once 04/16/18 1315 04/16/18 1657   04/16/18 1315  vancomycin (VANCOCIN) IVPB 1000 mg/200 mL premix  Status:  Discontinued     1,000 mg 200 mL/hr over 60 Minutes Intravenous  Once 04/16/18 1304 04/16/18 1314   04/16/18 1315  ceFEPIme (MAXIPIME) 2 g in sodium chloride 0.9 % 100 mL IVPB     2 g 200 mL/hr over 30 Minutes Intravenous  Once 04/16/18 1304 04/16/18 1419        Objective:   Vitals:   04/18/18 0452 04/18/18 0500 04/18/18 0600 04/18/18 0743  BP:  (!) 151/95 137/87   Pulse:  (!) 109 (!) 154 (!) 113  Resp:  10 (!) 36 (!) 29  Temp: 97.7 F (36.5 C)   97.9 F (36.6 C)  TempSrc: Oral   Axillary  SpO2:  97% 100% 100%  Weight: (!) 170.3 kg     Height:        Wt Readings from Last 3 Encounters:  04/18/18 (!) 170.3 kg  03/17/18 (!) 155.6 kg  02/20/18 (!) 156 kg  Intake/Output Summary (Last 24 hours) at 04/18/2018 0926 Last data filed at 04/18/2018 4098 Gross per 24 hour  Intake 1774.92 ml  Output 1250 ml  Net 524.92 ml    Physical exam General: Morbidly obese male not in distress HEENT: Moist mucosa, supple neck Chest: Diminished bilateral breath sounds, no rhonchi or wheeze CVs: S1-S2 irregularly irregular, no murmurs rub or gallop GI: Soft, nondistended, nontender, bowel sounds  present Musculoskeletal: Warm, 1+ pitting edema bilaterally    Data Review:    CBC Recent Labs  Lab 04/16/18 1248 04/17/18 0408 04/18/18 0410  WBC 7.0 5.3 5.3  HGB 9.0* 8.0* 8.0*  HCT 29.9* 26.6* 27.2*  PLT 172 175 184  MCV 97.1 97.1 97.8  MCH 29.2 29.2 28.8  MCHC 30.1 30.1 29.4*  RDW 14.9 15.0 14.9  LYMPHSABS 0.7  --   --   MONOABS 0.9  --   --   EOSABS 0.1  --   --   BASOSABS 0.0  --   --     Chemistries  Recent Labs  Lab 04/16/18 1248 04/17/18 0408 04/18/18 0410  NA 140 140 141  K 4.4 4.1 4.2  CL 105 108 110  CO2 25 26 26   GLUCOSE 109* 89 151*  BUN 32* 27* 23  CREATININE 2.31* 1.64* 1.38*  CALCIUM 8.4* 8.1* 8.1*   ------------------------------------------------------------------------------------------------------------------ No results for input(s): CHOL, HDL, LDLCALC, TRIG, CHOLHDL, LDLDIRECT in the last 72 hours.  No results found for: HGBA1C ------------------------------------------------------------------------------------------------------------------ Recent Labs    04/16/18 1237  TSH 1.118   ------------------------------------------------------------------------------------------------------------------ Recent Labs    04/16/18 1329  VITAMINB12 1,113*    Coagulation profile No results for input(s): INR, PROTIME in the last 168 hours.  No results for input(s): DDIMER in the last 72 hours.  Cardiac Enzymes Recent Labs  Lab 04/16/18 1248  TROPONINI <0.03   ------------------------------------------------------------------------------------------------------------------    Component Value Date/Time   BNP 453.0 (H) 04/16/2018 1248    Inpatient Medications  Scheduled Meds: . aspirin EC  81 mg Oral Daily  . Chlorhexidine Gluconate Cloth  6 each Topical Daily  . cholecalciferol  2,000 Units Oral QPM  . cycloSPORINE  1 drop Both Eyes BID  . darifenacin  15 mg Oral Daily  . docusate sodium  100 mg Oral BID  . famotidine  20 mg  Oral Daily  . gabapentin  300 mg Oral Daily  . heparin  5,000 Units Subcutaneous Q8H  . mouth rinse  15 mL Mouth Rinse BID  . methadone  10 mg Oral QID  . nystatin  1 g Topical Daily  . pantoprazole  40 mg Oral Daily  . predniSONE  7.5 mg Oral BID WC  . sodium chloride flush  10-40 mL Intracatheter Q12H  . Testosterone Propionate  0.5 mL Topical UD  . venlafaxine XR  300 mg Oral Q breakfast   Continuous Infusions: . sodium chloride 100 mL/hr at 04/17/18 0627  . ceFEPime (MAXIPIME) IV Stopped (04/18/18 0200)  . diltiazem (CARDIZEM) infusion 5 mg/hr (04/18/18 0625)  . vancomycin Stopped (04/17/18 1741)   PRN Meds:.acetaminophen **OR** acetaminophen, bisacodyl, furosemide, ondansetron **OR** ondansetron (ZOFRAN) IV, polyethylene glycol, sodium chloride flush  Micro Results Recent Results (from the past 240 hour(s))  Blood Culture (routine x 2)     Status: None (Preliminary result)   Collection Time: 04/16/18  1:23 PM  Result Value Ref Range Status   Specimen Description BLOOD  Final   Special Requests NONE  Final   Culture   Final  NO GROWTH 2 DAYS Performed at [facility], [address] [place], [place] [address]    Report Status PENDING  Incomplete  Blood Culture (routine x 2)     Status: None (Preliminary result)   Collection Time: 04/16/18  1:29 PM  Result Value Ref Range Status   Specimen Description BLOOD  Final   Special Requests NONE  Final   Culture   Final    NO GROWTH 2 DAYS Performed at [facility], [address], [place], [place] [address]    Report Status PENDING  Incomplete  Urine culture     Status: Abnormal   Collection Time: 04/16/18  2:40 PM  Result Value Ref Range Status   Specimen Description   Final    URINE, CLEAN CATCH Performed at [facility], [address], [place], [place] [address]    Special Requests   Final    NONE Performed at [facility], [address] [place], [place] [address]    Culture MULTIPLE SPECIES PRESENT, SUGGEST  RECOLLECTION (A)  Final   Report Status 04/17/2018 FINAL  Final  MRSA PCR Screening     Status: None   Collection Time: 04/16/18  5:30 PM  Result Value Ref Range Status   MRSA by PCR NEGATIVE NEGATIVE Final    Comment:        The GeneXpert MRSA Assay (FDA approved for NASAL specimens only), is one component of a comprehensive MRSA colonization surveillance program. It is not intended to diagnose MRSA infection nor to guide or monitor treatment for MRSA infections. Performed at [facility], [address] [place],  [address]     Radiology Reports Ct Head Wo Contrast  Result Date: 04/16/2018 CLINICAL DATA:  Shortness of breath on exertion for the past 3 days. Status post left hip replacement 3 weeks ago. Focal neural deficit for greater than 6 hours. EXAM: CT HEAD WITHOUT CONTRAST TECHNIQUE: Contiguous axial images were obtained from the base of the skull through the vertex without intravenous contrast. COMPARISON:  Brain MR dated 01/18/2018 and head CT dated 05/11/2007. FINDINGS: Brain: Mildly enlarged ventricles and cortical sulci. Minimal patchy white matter low density in both cerebral hemispheres. No intracranial hemorrhage, mass lesion or CT evidence of acute infarction. Vascular: No hyperdense vessel or unexpected calcification. Skull: Mild bilateral hyperostosis frontalis. Sinuses/Orbits: Unremarkable. Other: None. IMPRESSION: 1. No acute abnormality. 2. Mild atrophy and minimal chronic small vessel white matter ischemic changes in both cerebral hemispheres. Electronically Signed   By: Claudie Revering M.D.   On: 04/16/2018 14:22   Dg Chest Portable 1 View  Result Date: 04/16/2018 CLINICAL DATA:  Shortness of breath. Recent left hip replacement 3 weeks ago. EXAM: PORTABLE CHEST 1 VIEW COMPARISON:  03/15/2018 FINDINGS: Lungs are adequately inflated demonstrate airspace consolidation over the right upper lobe with mild patchy hazy density over the left midlung and right mid to  lower lung. Suggestion partial collapse right middle lobe. Small left effusion slightly worse. Stable cardiomegaly. Remainder the exam is unchanged. IMPRESSION: Airspace consolidation right upper lobe with minimal patchy hazy opacification left midlung and right base likely multifocal pneumonia. Slight worsening small left effusion. Partial collapse right middle lobe. Stable cardiomegaly. Electronically Signed   By: Marin Olp M.D.   On: 04/16/2018 12:55   [place] Ekg Site Rite  Result Date: 03/20/2018 If Site Rite image not attached, placement could not be confirmed due to current cardiac rhythm.   Time Spent in minutes  35   Bridgit Eynon M.D on  04/18/2018 at 9:26 AM  Between 7am to 7pm - Pager - [phone]  After 7pm go to [email or web address] - password [facility]  [facility] -  Office  [phone]

## 2018-04-18 NOTE — Progress Notes (Signed)
Patient expressed concern for spiritual support and prayer. No family member was present during visit. Listened supportively and helped patient begin life review. He expressed that he is grieving the loss of one of his friends for 49 years. He is sad that he is not able to be there at his friend funeral. Patient smiled more as visit progressed and he stated that he was "less scared". Patient thanked me for my visit and said "it really helps to talk". Offered prayer and patient ask that I pray for "world peace and his health".

## 2018-04-19 ENCOUNTER — Other Ambulatory Visit: Payer: Self-pay

## 2018-04-19 DIAGNOSIS — Z6841 Body Mass Index (BMI) 40.0 and over, adult: Secondary | ICD-10-CM

## 2018-04-19 DIAGNOSIS — Z7189 Other specified counseling: Secondary | ICD-10-CM

## 2018-04-19 DIAGNOSIS — R6510 Systemic inflammatory response syndrome (SIRS) of non-infectious origin without acute organ dysfunction: Secondary | ICD-10-CM

## 2018-04-19 DIAGNOSIS — N179 Acute kidney failure, unspecified: Secondary | ICD-10-CM

## 2018-04-19 DIAGNOSIS — I10 Essential (primary) hypertension: Secondary | ICD-10-CM

## 2018-04-19 DIAGNOSIS — I4891 Unspecified atrial fibrillation: Secondary | ICD-10-CM

## 2018-04-19 DIAGNOSIS — J189 Pneumonia, unspecified organism: Secondary | ICD-10-CM

## 2018-04-19 DIAGNOSIS — D638 Anemia in other chronic diseases classified elsewhere: Secondary | ICD-10-CM

## 2018-04-19 LAB — BASIC METABOLIC PANEL
Anion gap: 7 (ref 5–15)
BUN: 19 mg/dL (ref 8–23)
CO2: 27 mmol/L (ref 22–32)
Calcium: 8.3 mg/dL — ABNORMAL LOW (ref 8.9–10.3)
Chloride: 110 mmol/L (ref 98–111)
Creatinine, Ser: 1.27 mg/dL — ABNORMAL HIGH (ref 0.61–1.24)
GFR calc Af Amer: >60 mL/min (ref >60)
GFR calc non Af Amer: 55 mL/min — ABNORMAL LOW (ref >60)
Glucose, Bld: 124 mg/dL — ABNORMAL HIGH (ref 70–99)
Potassium: 4.1 mmol/L (ref 3.5–5.1)
Sodium: 144 mmol/L (ref 135–145)

## 2018-04-19 LAB — CBC
HCT: 28.7 % — ABNORMAL LOW (ref 39.0–52.0)
Hemoglobin: 8.5 g/dL — ABNORMAL LOW (ref 13.0–17.0)
MCH: 29 pg (ref 26.0–34.0)
MCHC: 29.6 g/dL — ABNORMAL LOW (ref 30.0–36.0)
MCV: 98 fL (ref 78.0–100.0)
Platelets: 186 10*3/uL (ref 150–400)
RBC: 2.93 MIL/uL — ABNORMAL LOW (ref 4.22–5.81)
RDW: 14.9 % (ref 11.5–15.5)
WBC: 6.6 10*3/uL (ref 4.0–10.5)

## 2018-04-19 LAB — HEPARIN LEVEL (UNFRACTIONATED): Heparin Unfractionated: 0.44 IU/mL (ref 0.30–0.70)

## 2018-04-19 MED ORDER — METOPROLOL TARTRATE 25 MG PO TABS
25.0000 mg | ORAL_TABLET | Freq: Two times a day (BID) | ORAL | Status: DC
  Administered 2018-04-19 (×2): 25 mg via ORAL
  Filled 2018-04-19 (×2): qty 1

## 2018-04-19 MED ORDER — PHENOL 1.4 % MT LIQD: 1.0000 | OROMUCOSAL | Status: DC | PRN

## 2018-04-19 MED ORDER — MENTHOL 3 MG MT LOZG: 1.0000 | LOZENGE | OROMUCOSAL | Status: DC | PRN

## 2018-04-19 MED ORDER — LEVOFLOXACIN IN D5W 750 MG/150ML IV SOLN
750.0000 mg | INTRAVENOUS | Status: DC
  Administered 2018-04-19 – 2018-04-20 (×2): 750 mg via INTRAVENOUS
  Filled 2018-04-19 (×2): qty 150

## 2018-04-19 MED ORDER — HEPARIN (PORCINE) IN NACL 100-0.45 UNIT/ML-% IJ SOLN
1600.0000 [IU]/h | INTRAMUSCULAR | Status: DC
  Administered 2018-04-19 – 2018-04-22 (×6): 1600 [IU]/h via INTRAVENOUS
  Filled 2018-04-19 (×5): qty 250

## 2018-04-19 MED ORDER — HEPARIN BOLUS VIA INFUSION
5000.0000 [IU] | Freq: Once | INTRAVENOUS | Status: AC
  Administered 2018-04-19: 5000 [IU] via INTRAVENOUS
  Filled 2018-04-19: qty 5000

## 2018-04-19 MED ORDER — POLYETHYLENE GLYCOL 3350 17 G PO PACK: 17.0000 g | PACK | Freq: Every day | ORAL | Status: DC

## 2018-04-19 MED ORDER — GUAIFENESIN-DM 100-10 MG/5ML PO SYRP: 5.0000 mL | ORAL_SOLUTION | ORAL | Status: DC | PRN

## 2018-04-19 MED ORDER — SODIUM CHLORIDE 0.9 % IV SOLN
2.0000 g | INTRAVENOUS | Status: DC
  Administered 2018-04-19 – 2018-04-27 (×9): 2 g via INTRAVENOUS
  Filled 2018-04-19: qty 2
  Filled 2018-04-19 (×2): qty 20
  Filled 2018-04-19 (×7): qty 2

## 2018-04-19 MED ORDER — POLYETHYLENE GLYCOL 3350 17 G PO PACK
17.0000 g | PACK | Freq: Two times a day (BID) | ORAL | Status: DC
  Administered 2018-04-19 – 2018-04-25 (×9): 17 g via ORAL
  Filled 2018-04-19 (×14): qty 1

## 2018-04-19 NOTE — Progress Notes (Signed)
ANTICOAGULATION CONSULT NOTE - Initial Consult  Pharmacy Consult for heparin Indication: atrial fibrillation  Allergies  Allergen Reactions  . Bee Venom Swelling    Patient Measurements: Height: 5' 8"  (172.7 cm) Weight: (!) 374 lb 9 oz (169.9 kg) IBW/kg (Calculated) : 68.4 Heparin Dosing Weight: 110 kg  Vital Signs: Temp: 98 F (36.7 C) (08/19 0500) Temp Source: Oral (08/19 0500)  Labs: Recent Labs    04/16/18 1248 04/17/18 0408 04/18/18 0410 04/19/18 0351  HGB 9.0* 8.0* 8.0* 8.5*  HCT 29.9* 26.6* 27.2* 28.7*  PLT 172 175 184 186  CREATININE 2.31* 1.64* 1.38* 1.27*  TROPONINI <0.03  --   --   --     Estimated Creatinine Clearance: 82.3 mL/min (A) (by C-G formula based on SCr of 1.27 mg/dL (H)).   Medical History: Past Medical History:  Diagnosis Date  . Anginal pain ([place]) 2006   evaluated by cardio  . Arthritis    knees,feet,shoulders,elbows.hands  . Constipation   . Dyspnea    with exertion   . Dysrhythmia    Atrial Flutter- 2006- corredted itself  . Family history of adverse reaction to anesthesia    mother- with novocaine went into shock  . GERD (gastroesophageal reflux disease)   . Headache   . Hemorrhoids   . History of blood transfusion   . Hypertension   . Insomnia   . Sleep apnea    cpap  . 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 acetaminophen (TYLENOL) 325 MG tablet Take 650 mg by mouth every 12 (twelve) hours.   Past Week at Unknown time  . amitriptyline (ELAVIL) 25 MG tablet Take 25 mg by mouth at bedtime.   Past Week at Unknown time  . amLODipine (NORVASC) 10 MG tablet Take 10 mg by mouth daily.   Past Week at Unknown time  . aspirin EC 81 MG tablet Take 1 tablet by mouth daily.   Past Week at Unknown time  . cefTRIAXone (ROCEPHIN) IVPB Inject 2 g into the vein daily. Indication:  Prosthetic joint nfection Last Day of Therapy:   04/28/18 Labs - Once weekly:  CBC/D and BMP, Labs - Every other week:  ESR and CRP 37 Units 0 Past Week at Unknown time  . celecoxib (CELEBREX) 200 MG capsule Take 1 capsule by mouth 2 (two) times daily.   Past Week at Unknown time  . Cholecalciferol (VITAMIN D3) 2000 units TABS Take 2,000 Units by mouth every evening.    Past Week at Unknown time  . cycloSPORINE (RESTASIS) 0.05 % ophthalmic emulsion Place 1 drop into both eyes 2 (two) times daily.    Past Week at Unknown time  . diphenhydrAMINE (BENADRYL) 25 MG tablet Take 25-50 mg by mouth every 8 (eight) hours as needed for allergies (bee stings).    Past Week at Unknown time  . docusate sodium (COLACE) 100 MG capsule Take 1 capsule (100 mg total) by mouth 2 (two) times daily. 10 capsule 0   . famotidine (PEPCID) 20 MG tablet Take 1 tablet by mouth daily.   Past Week at Unknown time  . furosemide (LASIX) 20 MG tablet Take 1 tablet (20 mg total) by mouth daily as needed for fluid or edema (as needed for edema.). 20 tablet 0 Past Week at Unknown time  . gabapentin (NEURONTIN) 300 MG capsule Take 900 mg by mouth at bedtime.    Past Week  at Unknown time  . gabapentin (NEURONTIN) 300 MG capsule Take 900 mg by mouth at bedtime.   Past Week at Unknown time  . gabapentin (NEURONTIN) 600 MG tablet Take 300 mg by mouth daily.    Past Week at Unknown time  . methadone (DOLOPHINE) 10 MG tablet Take 1 tablet (10 mg total) by mouth 4 (four) times daily. 30 tablet 0 Past Week at Unknown time  . methocarbamol (ROBAXIN) 750 MG tablet Take 750 mg by mouth every 6 (six) hours.    Past Week at Unknown time  . mometasone (ELOCON) 0.1 % cream Apply 1 application topically daily as needed.   Past Week at Unknown time  . Multiple Vitamins-Minerals (CENTRUM ADULTS PO) Take 1 tablet by mouth every evening.    Past Week at Unknown time  . mupirocin ointment (BACTROBAN) 2 % Apply 1 application topically 3 (three) times daily.   Past Week at Unknown time  . nystatin  (MYCOSTATIN/NYSTOP) powder Apply 1 g topically daily. Apply to right and left abdominal folds topically every day shift for rash   Past Week at Unknown time  . omeprazole (PRILOSEC) 40 MG capsule Take 40 mg by mouth every evening.    Past Week at Unknown time  . ondansetron (ZOFRAN) 4 MG tablet Take 1 tablet (4 mg total) by mouth every 6 (six) hours as needed for nausea. 20 tablet 0 Past Week at Unknown time  . oxyCODONE (OXY IR/ROXICODONE) 5 MG immediate release tablet Take 5 mg by mouth every 6 (six) hours as needed for severe pain.   Past Week at Unknown time  . polyethylene glycol (MIRALAX / GLYCOLAX) packet Take 17 g by mouth daily as needed for mild constipation. 14 each 0 Past Week at Unknown time  . predniSONE (DELTASONE) 2.5 MG tablet Take 3 tablets (7.5 mg total) by mouth 2 (two) times daily with a meal. 20 tablet 0 Past Week at Unknown time  . PRESCRIPTION MEDICATION Place 1 application onto the skin at bedtime. Testosterone Cream 10%   Past Week at Unknown time  . Skin Protectants, Misc. (EUCERIN) cream Apply 1 application topically daily.   Past Week at Unknown time  . solifenacin (VESICARE) 10 MG tablet Take 10 mg by mouth daily.   Past Week at Unknown time  . Testosterone Propionate POWD Apply 0.5 mLs topically as directed. Apply 2 clicks (0.7PX) once a day as directed   Past Week at Unknown time  . venlafaxine XR (EFFEXOR-XR) 150 MG 24 hr capsule Take 300 mg by mouth daily with breakfast.   Past Week at Unknown time  . enoxaparin (LOVENOX) 40 MG/0.4ML injection Inject 0.4 mLs (40 mg total) into the skin daily. Inject Lovenox once a day for 10 days after surgery followed by 3 weeks of a baby aspirin once a day. (Patient not taking: Reported on 04/16/2018) 4 Syringe 0 Not Taking at Unknown time    Assessment: Pharmacy consulted to dose heparin for patient with atrial fibrillation. Cardiology does not recommend NOAC's due to weight.  Will ultimately be candidate for warfarin once heparin  drip transitioned.   Goal of Therapy:  Heparin level 0.3-0.7 units/ml Monitor platelets by anticoagulation protocol: Yes   Plan:  Give 5000 units bolus x 1 Start heparin infusion at 1600 units/hr Check anti-Xa level in 6-8 hours and daily while on heparin Continue to monitor H&H and platelets  Revonda Standard Dinah Lupa 04/19/2018,11:46 AM

## 2018-04-19 NOTE — Progress Notes (Signed)
[place] for heparin Indication: atrial fibrillation  Allergies  Allergen Reactions  . Bee Venom Swelling    Patient Measurements: Height: 5' 8" (172.7 cm) Weight: (!) 374 lb 9 oz (169.9 kg) IBW/kg (Calculated) : 68.4 Heparin Dosing Weight: 110 kg  Vital Signs: Temp: 97.5 F (36.4 C) (08/19 1656) Temp Source: Axillary (08/19 1228) BP: 176/115 (08/19 2030) Pulse Rate: 118 (08/19 2030)  Labs: Recent Labs    04/17/18 0408 04/18/18 0410 04/19/18 0351 04/19/18 2003  HGB 8.0* 8.0* 8.5*  --   HCT 26.6* 27.2* 28.7*  --   PLT 175 184 186  --   HEPARINUNFRC  --   --   --  0.44  CREATININE 1.64* 1.38* 1.27*  --     Estimated Creatinine Clearance: 82.3 mL/min (A) (by C-G formula based on SCr of 1.27 mg/dL (H)).   Medical History: Past Medical History:  Diagnosis Date  . Anginal pain ([place]) 2006   evaluated by cardio  . Arthritis    knees,feet,shoulders,elbows.hands  . Constipation   . Dyspnea    with exertion   . Dysrhythmia    Atrial Flutter- 2006- corredted itself  . Family history of adverse reaction to anesthesia    mother- with novocaine went into shock  . GERD (gastroesophageal reflux disease)   . Headache   . Hemorrhoids   . History of blood transfusion   . Hypertension   . Insomnia   . Sleep apnea    cpap  . 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 acetaminophen (TYLENOL) 325 MG tablet Take 650 mg by mouth every 12 (twelve) hours.   Past Week at Unknown time  . amitriptyline (ELAVIL) 25 MG tablet Take 25 mg by mouth at bedtime.   Past Week at Unknown time  . amLODipine (NORVASC) 10 MG tablet Take 10 mg by mouth daily.   Past Week at Unknown time  . aspirin EC 81 MG tablet Take 1 tablet by mouth daily.   Past Week at Unknown time  . cefTRIAXone (ROCEPHIN) IVPB Inject 2 g into the vein daily. Indication:  Prosthetic  joint nfection Last Day of Therapy:  04/28/18 Labs - Once weekly:  CBC/D and BMP, Labs - Every other week:  ESR and CRP 37 Units 0 Past Week at Unknown time  . celecoxib (CELEBREX) 200 MG capsule Take 1 capsule by mouth 2 (two) times daily.   Past Week at Unknown time  . Cholecalciferol (VITAMIN D3) 2000 units TABS Take 2,000 Units by mouth every evening.    Past Week at Unknown time  . cycloSPORINE (RESTASIS) 0.05 % ophthalmic emulsion Place 1 drop into both eyes 2 (two) times daily.    Past Week at Unknown time  . diphenhydrAMINE (BENADRYL) 25 MG tablet Take 25-50 mg by mouth every 8 (eight) hours as needed for allergies (bee stings).    Past Week at Unknown time  . docusate sodium (COLACE) 100 MG capsule Take 1 capsule (100 mg total) by mouth 2 (two) times daily. 10 capsule 0   . famotidine (PEPCID) 20 MG tablet Take 1 tablet by mouth daily.   Past Week at Unknown time  . furosemide (LASIX) 20 MG tablet Take 1 tablet (20 mg total) by mouth daily as needed for fluid or edema (as needed for edema.). 20 tablet 0 Past Week at Unknown time  . gabapentin (NEURONTIN) 300  MG capsule Take 900 mg by mouth at bedtime.    Past Week at Unknown time  . gabapentin (NEURONTIN) 300 MG capsule Take 900 mg by mouth at bedtime.   Past Week at Unknown time  . gabapentin (NEURONTIN) 600 MG tablet Take 300 mg by mouth daily.    Past Week at Unknown time  . methadone (DOLOPHINE) 10 MG tablet Take 1 tablet (10 mg total) by mouth 4 (four) times daily. 30 tablet 0 Past Week at Unknown time  . methocarbamol (ROBAXIN) 750 MG tablet Take 750 mg by mouth every 6 (six) hours.    Past Week at Unknown time  . mometasone (ELOCON) 0.1 % cream Apply 1 application topically daily as needed.   Past Week at Unknown time  . Multiple Vitamins-Minerals (CENTRUM ADULTS PO) Take 1 tablet by mouth every evening.    Past Week at Unknown time  . mupirocin ointment (BACTROBAN) 2 % Apply 1 application topically 3 (three) times daily.   Past Week  at Unknown time  . nystatin (MYCOSTATIN/NYSTOP) powder Apply 1 g topically daily. Apply to right and left abdominal folds topically every day shift for rash   Past Week at Unknown time  . omeprazole (PRILOSEC) 40 MG capsule Take 40 mg by mouth every evening.    Past Week at Unknown time  . ondansetron (ZOFRAN) 4 MG tablet Take 1 tablet (4 mg total) by mouth every 6 (six) hours as needed for nausea. 20 tablet 0 Past Week at Unknown time  . oxyCODONE (OXY IR/ROXICODONE) 5 MG immediate release tablet Take 5 mg by mouth every 6 (six) hours as needed for severe pain.   Past Week at Unknown time  . polyethylene glycol (MIRALAX / GLYCOLAX) packet Take 17 g by mouth daily as needed for mild constipation. 14 each 0 Past Week at Unknown time  . predniSONE (DELTASONE) 2.5 MG tablet Take 3 tablets (7.5 mg total) by mouth 2 (two) times daily with a meal. 20 tablet 0 Past Week at Unknown time  . PRESCRIPTION MEDICATION Place 1 application onto the skin at bedtime. Testosterone Cream 10%   Past Week at Unknown time  . Skin Protectants, Misc. (EUCERIN) cream Apply 1 application topically daily.   Past Week at Unknown time  . solifenacin (VESICARE) 10 MG tablet Take 10 mg by mouth daily.   Past Week at Unknown time  . Testosterone Propionate POWD Apply 0.5 mLs topically as directed. Apply 2 clicks (0.5LZ) once a day as directed   Past Week at Unknown time  . venlafaxine XR (EFFEXOR-XR) 150 MG 24 hr capsule Take 300 mg by mouth daily with breakfast.   Past Week at Unknown time  . enoxaparin (LOVENOX) 40 MG/0.4ML injection Inject 0.4 mLs (40 mg total) into the skin daily. Inject Lovenox once a day for 10 days after surgery followed by 3 weeks of a baby aspirin once a day. (Patient not taking: Reported on 04/16/2018) 4 Syringe 0 Not Taking at Unknown time    Assessment: Pharmacy consulted to dose heparin for patient with atrial fibrillation. Cardiology does not recommend NOAC's due to weight.  Will ultimately be  candidate for warfarin once heparin drip transitioned.  Heparin level therapeutic at 0.44   Goal of Therapy:  Heparin level 0.3-0.7 units/ml Monitor platelets by anticoagulation protocol: Yes   Plan:  Continue heparin infusion at 1600 units/hr Check anti-Xa level daily while on heparin Continue to monitor H&H and platelets   Ramond Craver 04/19/2018,9:26 PM

## 2018-04-19 NOTE — Consult Note (Addendum)
Cardiology Consultation:   Patient ID: ERI PLATTEN; [id number]; Oct 26, 1945   Admit date: 04/16/2018 Date of Consult: 04/19/2018  Primary Care Provider: [name], MD Primary Cardiologist: Kate Sable, MD   Primary Electrophysiologist: N/A   Patient Profile:   [name] is a 72 y.o. male with a hx of dyspnea on exertion with stress testing in 2015 demonstrating diaphragmatic soft tissue attenuation with no evidence of ischemia, low risk, normal LVEF on echo in 2016 EF 55 to 60% with grade 1 DD, morbid obesity with weights in excess of 600 pounds in the past who is being seen today for the evaluation of atrial fibrillation with RVR at the request of Dr. Clementeen Graham.  History of Present Illness:   [name] is a 72 year old male patient who Dr. Bronson Ing sign 2015 for dyspnea on exertion with low risk stress testing and 2D echo with normal LVEF 55-60% with grade 1 DD.  He also has morbid obesity, hypertension, obstructive sleep apnea on CPAP.  Had left total hip arthroplasty with revision 02/17/2018 and sent to a nursing home but developed postop hematoma and purulent discharge from his wound 2 weeks later.  He was placed on IV vancomycin and Zosyn and cultures grew E. coli and Enterobacter aerogenous.  Patient was admitted 04/16/2018 with increased shortness of breath, weakness, hallucinations.  He was found to be in atrial fibrillation with RVR.  Chest x-ray showed mild focal pneumonia right upper lobe, small left pleural effusion and partially collapsed right middle lobe.  He is on IV vancomycin and Zosyn and blood cultures were sent. Still having drainage from hip wound.  Patient is unaware of the afib-no chest pain, palpitations. He has chronic dyspnea and dyspnea on exertion.  Past Medical History:  Diagnosis Date  . Anginal pain ([place]) 2006   evaluated by cardio  . Arthritis    knees,feet,shoulders,elbows.hands  . Constipation   . Dyspnea    with exertion   . Dysrhythmia      Atrial Flutter- 2006- corredted itself  . Family history of adverse reaction to anesthesia    mother- with novocaine went into shock  . GERD (gastroesophageal reflux disease)   . Headache   . Hemorrhoids   . History of blood transfusion   . Hypertension   . Insomnia   . Sleep apnea    cpap  . 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 APPENDECTOMY    . ARTERY BIOPSY Left 12/30/2017   Procedure: BIOPSY TEMPORAL ARTERY;  Surgeon: Fanny Skates, MD;  Location: [facility];  Service: General;  Laterality: Left;  . CHOLECYSTECTOMY    . COLONOSCOPY W/ POLYPECTOMY    . ESOPHAGOGASTRODUODENOSCOPY (EGD) WITH PROPOFOL  07/06/2012   Procedure: ESOPHAGOGASTRODUODENOSCOPY (EGD) WITH PROPOFOL;  Surgeon: Jeryl Columbia, MD;  Location: [facility];  Service: Endoscopy;  Laterality: N/A;  . ESOPHAGOSCOPY    . EYE SURGERY     left eye- muscle repair  . HERNIA REPAIR     ventral hernia  . INCISION AND DRAINAGE HIP Left 03/17/2018   Procedure: IRRIGATION AND DEBRIDEMENT LEFT THIGH WOUND;  Surgeon: Gaynelle Arabian, MD;  Location: [facility];  Service: Orthopedics;  Laterality: Left;  . JOINT REPLACEMENT     bilateral hips.  Right broke and had to be re placed again  . MASS EXCISION N/A 03/02/2015   Procedure: EXCISION ABDOMINAL WALL MASS;  Surgeon: Alphonsa Overall, MD;  Location: [facility];  Service: General;  Laterality: N/A;  . TOTAL HIP REVISION Left 02/17/2018   Procedure: Left total hip arthroplasty revision;  Surgeon: Gaynelle Arabian, MD;  Location: [facility];  Service: Orthopedics;  Laterality: Left;  Marland Kitchen VERTICAL BANDED GASTROPLASTY       Home Medications:  Prior to Admission medications   Medication Sig Start Date End Date Taking? Authorizing Provider  acetaminophen (TYLENOL) 325 MG tablet Take 650 mg by mouth every 12 (twelve) hours.   Yes [provider]  amitriptyline (ELAVIL) 25 MG tablet Take 25 mg by mouth at bedtime.   Yes [provider]  amLODipine (NORVASC) 10 MG tablet Take 10 mg by mouth daily.   Yes [provider]  aspirin EC 81 MG tablet Take 1 tablet by mouth daily.   Yes [provider]  cefTRIAXone (ROCEPHIN) IVPB Inject 2 g into the vein daily. Indication:  Prosthetic joint nfection Last Day of Therapy:  04/28/18 Labs - Once weekly:  CBC/D and BMP, Labs - Every other week:  ESR and CRP 03/23/18 04/29/18 Yes Edmisten, Kristie L, PA  celecoxib (CELEBREX) 200 MG capsule Take 1 capsule by mouth 2 (two) times daily. 01/19/18  Yes [provider]  Cholecalciferol (VITAMIN D3) 2000 units TABS Take 2,000 Units by mouth every evening.    Yes [provider]  cycloSPORINE (RESTASIS) 0.05 % ophthalmic emulsion Place 1 drop into both eyes 2 (two) times daily.    Yes [provider]  diphenhydrAMINE (BENADRYL) 25 MG tablet Take 25-50 mg by mouth every 8 (eight) hours as needed for allergies (bee stings).    Yes [provider]  docusate sodium (COLACE) 100 MG capsule Take 1 capsule (100 mg total) by mouth 2 (two) times daily. 03/23/18  Yes Edmisten, Kristie L, PA  famotidine (PEPCID) 20 MG tablet Take 1 tablet by mouth daily. 04/09/18  Yes [provider]  furosemide (LASIX) 20 MG tablet Take 1 tablet (20 mg total) by mouth daily as needed for fluid or edema (as needed for edema.). 02/23/18  Yes Arrien, Jimmy Picket, MD  gabapentin (NEURONTIN) 300 MG capsule Take 900 mg by mouth at bedtime.    Yes [provider]  gabapentin (NEURONTIN) 300 MG capsule Take 900 mg by mouth at bedtime.   Yes [provider]  gabapentin (NEURONTIN) 600 MG tablet Take 300 mg by mouth daily.    Yes [provider]  methadone (DOLOPHINE) 10 MG tablet Take 1 tablet (10 mg total) by mouth 4 (four) times daily. 03/23/18  Yes Edmisten, Kristie L, PA  methocarbamol (ROBAXIN) 750 MG tablet Take 750 mg by mouth every 6 (six) hours.    Yes [provider]    mometasone (ELOCON) 0.1 % cream Apply 1 application topically daily as needed. 02/09/18  Yes [provider]  Multiple Vitamins-Minerals (CENTRUM ADULTS PO) Take 1 tablet by mouth every evening.    Yes [provider]  mupirocin ointment (BACTROBAN) 2 % Apply 1 application topically 3 (three) times daily. 10/22/17 10/22/18 Yes [provider]  nystatin (MYCOSTATIN/NYSTOP) powder Apply 1 g topically daily. Apply to right and left abdominal folds topically every day shift for rash   Yes [provider]  omeprazole (PRILOSEC) 40 MG capsule Take 40 mg by mouth every evening.    Yes [provider]  ondansetron (ZOFRAN) 4 MG tablet Take 1 tablet (4 mg total) by mouth every 6 (six) hours as needed for nausea. 03/23/18  Yes Edmisten, Kristie L, PA  oxyCODONE (OXY  IR/ROXICODONE) 5 MG immediate release tablet Take 5 mg by mouth every 6 (six) hours as needed for severe pain.   Yes [provider]  polyethylene glycol (MIRALAX / GLYCOLAX) packet Take 17 g by mouth daily as needed for mild constipation. 02/23/18  Yes Arrien, Jimmy Picket, MD  predniSONE (DELTASONE) 2.5 MG tablet Take 3 tablets (7.5 mg total) by mouth 2 (two) times daily with a meal. 03/23/18  Yes Edmisten, Kristie L, PA  PRESCRIPTION MEDICATION Place 1 application onto the skin at bedtime. Testosterone Cream 10%   Yes [provider]  Skin Protectants, Misc. (EUCERIN) cream Apply 1 application topically daily.   Yes [provider]  solifenacin (VESICARE) 10 MG tablet Take 10 mg by mouth daily.   Yes [provider]  Testosterone Propionate POWD Apply 0.5 mLs topically as directed. Apply 2 clicks (1.6XW) once a day as directed 01/05/18  Yes [provider]  venlafaxine XR (EFFEXOR-XR) 150 MG 24 hr capsule Take 300 mg by mouth daily with breakfast.   Yes [provider]  enoxaparin (LOVENOX) 40 MG/0.4ML injection Inject 0.4 mLs (40 mg total) into the  skin daily. Inject Lovenox once a day for 10 days after surgery followed by 3 weeks of a baby aspirin once a day. Patient not taking: Reported on 04/16/2018 03/24/18   Derl Barrow, PA    Inpatient Medications: Scheduled Meds: . aspirin EC  81 mg Oral Daily  . Chlorhexidine Gluconate Cloth  6 each Topical Daily  . cholecalciferol  2,000 Units Oral QPM  . cycloSPORINE  1 drop Both Eyes BID  . darifenacin  15 mg Oral Daily  . docusate sodium  100 mg Oral BID  . famotidine  20 mg Oral Daily  . furosemide  40 mg Oral Daily  . gabapentin  300 mg Oral Daily  . heparin  5,000 Units Subcutaneous Q8H  . mouth rinse  15 mL Mouth Rinse BID  . methadone  10 mg Oral QID  . nystatin  1 g Topical Daily  . pantoprazole  40 mg Oral Daily  . polyethylene glycol  17 g Oral BID  . predniSONE  7.5 mg Oral BID WC  . sodium chloride flush  10-40 mL Intracatheter Q12H  . venlafaxine XR  300 mg Oral Q breakfast   Continuous Infusions: . sodium chloride 100 mL/hr at 04/18/18 2348  . ceFEPime (MAXIPIME) IV Stopped (04/19/18 0036)  . diltiazem (CARDIZEM) infusion 15 mg/hr (04/19/18 0432)  . vancomycin Stopped (04/18/18 1756)   PRN Meds: acetaminophen **OR** acetaminophen, bisacodyl, guaiFENesin-dextromethorphan, menthol-cetylpyridinium, ondansetron **OR** ondansetron (ZOFRAN) IV, phenol, sodium chloride flush  Allergies:    Allergies  Allergen Reactions  . Bee Venom Swelling    Social History:   Social History   Socioeconomic History  . Marital status: Divorced    Spouse name: Not on file  . Number of children: Not on file  . Years of education: Not on file  . Highest education level: Not on file  Occupational History  . Not on file  Social Needs  . Financial resource strain: Not on file  . Food insecurity:    Worry: Not on file    Inability: Not on file  . Transportation needs:    Medical: Not on file    Non-medical: Not on file  Tobacco Use  . Smoking status: Former Smoker     Packs/day: 2.00    Years: 10.00    Pack years: 20.00    Types: Cigarettes  Start date: 05/16/1966    Last attempt to quit: 03/11/1984    Years since quitting: 34.1  . Smokeless tobacco: Never Used  Substance and Sexual Activity  . Alcohol use: Yes    Alcohol/week: 0.0 standard drinks    Comment: occasionally a couple of times a year   . Drug use: No  . Sexual activity: Not on file  Lifestyle  . Physical activity:    Days per week: Not on file    Minutes per session: Not on file  . Stress: Not on file  Relationships  . Social connections:    Talks on phone: Not on file    Gets together: Not on file    Attends religious service: Not on file    Active member of club or organization: Not on file    Attends meetings of clubs or organizations: Not on file    Relationship status: Not on file  . Intimate partner violence:    Fear of current or ex partner: Not on file    Emotionally abused: Not on file    Physically abused: Not on file    Forced sexual activity: Not on file  Other Topics Concern  . Not on file  Social History Narrative  . Not on file    Family History:     Family History  Problem Relation Age of Onset  . Cancer Mother   . Alcohol abuse Father      ROS:  Please see the history of present illness.  Review of Systems  Constitution: Positive for malaise/fatigue.  HENT: Negative.   Cardiovascular: Positive for dyspnea on exertion and irregular heartbeat.  Respiratory: Positive for shortness of breath.   Endocrine: Negative.   Hematologic/Lymphatic: Negative.   Skin: Positive for poor wound healing.  Musculoskeletal: Positive for muscle weakness and myalgias.  Gastrointestinal: Negative.   Genitourinary: Negative.   Neurological: Negative.     All other ROS reviewed and negative.     Physical Exam/Data:   Vitals:   04/18/18 2300 04/18/18 2345 04/19/18 0000 04/19/18 0500  BP: (!) 164/81     Pulse: (!) 146 89    Resp: (!) 41 (!) 22    Temp:   98.6  F (37 C) 98 F (36.7 C)  TempSrc:   Oral Oral  SpO2: (!) 77% 96%    Weight:    (!) 169.9 kg  Height:        Intake/Output Summary (Last 24 hours) at 04/19/2018 0901 Last data filed at 04/19/2018 0500 Gross per 24 hour  Intake 2893.08 ml  Output 2000 ml  Net 893.08 ml   Filed Weights   04/16/18 1738 04/18/18 0452 04/19/18 0500  Weight: (!) 168.8 kg (!) 170.3 kg (!) 169.9 kg   Body mass index is 56.95 kg/m.  General:  Obese.in no acute distress HEENT: normal Lymph: no adenopathy Neck: no JVD Endocrine:  No thryomegaly Vascular: No carotid bruits; FA pulses 2+ bilaterally without bruits  Cardiac:  normal S1, S2; irreg irreg; no murmur   Lungs:  Decreased breath sounds throughout Abd: soft, nontender, no hepatomegaly  Ext: no edema Musculoskeletal:  No deformities, BUE and BLE strength normal and equal Skin: warm and dry  Neuro:  CNs 2-12 intact, no focal abnormalities noted Psych:  Normal affect   EKG:  The EKG was personally reviewed and demonstrates: Atrial fibrillation with RVR low voltage Telemetry:  Telemetry was personally reviewed and demonstrates:  Afib at 120/m  Relevant CV Studies: 2D echo  04/17/2018 Study Conclusions   - Left ventricle: The cavity size was normal. There was moderate   concentric hypertrophy. Systolic function was normal. The   estimated ejection fraction was in the range of 50% to 55%.   Diffuse hypokinesis. - Aortic valve: Trileaflet; normal thickness leaflets. There was no   regurgitation. - Mitral valve: There was no regurgitation. - Left atrium: The atrium was mildly dilated. - Right ventricle: The cavity size was normal. Wall thickness was   normal. Systolic function was normal. - Tricuspid valve: There was no regurgitation. - Pulmonary arteries: Systolic pressure could not be accurately   estimated. - Inferior vena cava: The vessel was normal in size. - Pericardium, extracardiac: There was no pericardial effusion.    Impressions:   - LVEF mildly diffusely decreased most probably secondary to atrial   fibrillation with RVR dusring acquisition.   Nuclear stress test 2015IMPRESSION: 1. Diaphragmatic attenuation without clear evidence of focal ischemia. TID ratio is borderline at 1.21.   2. No focal LV wall motion abnormalities.   3. Left ventricular ejection fraction 58%   4. Low-risk stress test findings*.   Laboratory Data:  Chemistry Recent Labs  Lab 04/17/18 0408 04/18/18 0410 04/19/18 0351  NA 140 141 144  K 4.1 4.2 4.1  CL 108 110 110  CO2 26 26 27   GLUCOSE 89 151* 124*  BUN 27* 23 19  CREATININE 1.64* 1.38* 1.27*  CALCIUM 8.1* 8.1* 8.3*  GFRNONAA 40* 50* 55*  GFRAA 47* 58* >60  ANIONGAP 6 5 7     No results for input(s): PROT, ALBUMIN, AST, ALT, ALKPHOS, BILITOT in the last 168 hours. Hematology Recent Labs  Lab 04/17/18 0408 04/18/18 0410 04/19/18 0351  WBC 5.3 5.3 6.6  RBC 2.74* 2.78* 2.93*  HGB 8.0* 8.0* 8.5*  HCT 26.6* 27.2* 28.7*  MCV 97.1 97.8 98.0  MCH 29.2 28.8 29.0  MCHC 30.1 29.4* 29.6*  RDW 15.0 14.9 14.9  PLT 175 184 186   Cardiac Enzymes Recent Labs  Lab 04/16/18 1248  TROPONINI <0.03   No results for input(s): TROPIPOC in the last 168 hours.  BNP Recent Labs  Lab 04/16/18 1248  BNP 453.0*    DDimer No results for input(s): DDIMER in the last 168 hours.  Radiology/Studies:  Ct Head Wo Contrast  Result Date: 04/16/2018 CLINICAL DATA:  Shortness of breath on exertion for the past 3 days. Status post left hip replacement 3 weeks ago. Focal neural deficit for greater than 6 hours. EXAM: CT HEAD WITHOUT CONTRAST TECHNIQUE: Contiguous axial images were obtained from the base of the skull through the vertex without intravenous contrast. COMPARISON:  Brain MR dated 01/18/2018 and head CT dated 05/11/2007. FINDINGS: Brain: Mildly enlarged ventricles and cortical sulci. Minimal patchy white matter low density in both cerebral hemispheres. No  intracranial hemorrhage, mass lesion or CT evidence of acute infarction. Vascular: No hyperdense vessel or unexpected calcification. Skull: Mild bilateral hyperostosis frontalis. Sinuses/Orbits: Unremarkable. Other: None. IMPRESSION: 1. No acute abnormality. 2. Mild atrophy and minimal chronic small vessel white matter ischemic changes in both cerebral hemispheres. Electronically Signed   By: Claudie Revering M.D.   On: 04/16/2018 14:22   Dg Chest Portable 1 View  Result Date: 04/16/2018 CLINICAL DATA:  Shortness of breath. Recent left hip replacement 3 weeks ago. EXAM: PORTABLE CHEST 1 VIEW COMPARISON:  03/15/2018 FINDINGS: Lungs are adequately inflated demonstrate airspace consolidation over the right upper lobe with mild patchy hazy density over the left midlung and right mid  to lower lung. Suggestion partial collapse right middle lobe. Small left effusion slightly worse. Stable cardiomegaly. Remainder the exam is unchanged. IMPRESSION: Airspace consolidation right upper lobe with minimal patchy hazy opacification left midlung and right base likely multifocal pneumonia. Slight worsening small left effusion. Partial collapse right middle lobe. Stable cardiomegaly. Electronically Signed   By: Marin Olp M.D.   On: 04/16/2018 12:55    Assessment and Plan:   Atrial fibrillation with RVR  HR 120-130's on IV diltiazem at 15 mg/hr. Echo with EF 50-55% with diffuse HK probably secondary to Afib. LA mildly dilated. Maybe secondary to acute illness/infection. Need good HR control.CHADs Vasc2=2  for age, HTN- on ASA. Add metoprolol 25 mg BID for better rate control.  Recent right hip arthroplasty and revision with subsequent wound infection growing E. coli and Enterobacter aerogenous. HCAP on  IV Vanc & Cefepime Morbid obesity Hypertension BP up here. Was on Norvasc 10 mg PTA Hyperlipidemia Obstructive sleep apnea on CPAP AKI-Crt coming down-was on IV fluids then given lasix for leg swelling   For  questions or updates, please contact [facility] Please consult [email or web address] for contact info under Cardiology/STEMI.   Signed, Ermalinda Barrios, PA-C  04/19/2018 9:01 AM   The patient was seen and examined, and I agree with the history, physical exam, assessment and plan as documented above, with modifications as noted below. I have also personally reviewed all relevant documentation, old records, labs, and both radiographic and cardiovascular studies. I have also independently interpreted old and new ECG's.  Briefly, this is a 72 year old overly obese male with obstructive sleep apnea, hypertension, giant cell arteritis, chronic diplopia, who underwent left total hip arthroplasty with revision on 02/17/2018 and subsequently developed a postoperative wound infection.  He ultimately underwent incision and drainage on 03/17/2018.  He was evaluated by ID and has been on antimicrobial therapy.  He has developed increasing exertional dyspnea over the past 3 to 4 days and presented to the ED with multi lobar pneumonia seen on chest x-ray and rapid atrial fibrillation.  He has been placed on a diltiazem infusion presently at 15 mg/h with heart rates currently in the 110-130 bpm range.  He is also anemic with hemoglobin of 8.5 today.  It was 11.6 on 02/16/2018.  He is currently on aspirin and Lovenox.  Head CT showed no acute abnormalities with chronic small vessel white matter ischemic changes in both cerebral hemispheres.  Portable chest x-ray on 04/16/2018 showed multifocal pneumonia with partial collapse of the right middle lobe and slight worsening of a small left effusion.  I reviewed the echocardiogram performed on 04/17/2018 which demonstrated normal left ventricular systolic function with diffuse hypokinesis, LVEF 50 to 55%, moderate concentric LVH, and mild left atrial dilatation.  CHA2DS2/VAS Stroke Risk Points  Current as of 12 minutes ago     2 >= 2 Points: High Risk   He is currently on  aspirin 81 mg and prophylactic subcutaneous heparin.  Rapid atrial fibrillation likely triggered by pneumonia. That being said, he is at increased risk for recurrence of atrial arrhythmias given his morbid obesity, OSA, and hypertension (albeit LA is only mildly enlarged).  We will start metoprolol 25 mg twice daily for more optimal heart rate control.  He require systemic anticoagulation for thromboembolic risk reduction, although he is also anemic which will need further work-up.  Last EGD was performed in November 2013.  He did have a postoperative hematoma and EMR indicates anemia of chronic disease but I would not  expect this degree of hemoglobin drop for these reasons alone, albeit possible.   Given his morbid obesity factor Xa inhibitors are not advised and he would be best suited ultimately with warfarin if hemoglobin does not drop further.  I will discontinue aspirin and prophylactic subcutaneous heparin and start IV heparin with pharmacy consultation.  Hemoglobin will need close monitoring.   Kate Sable, MD, [facility]  Time spent: 45 minutes.  04/19/2018 11:01 AM

## 2018-04-19 NOTE — Progress Notes (Addendum)
PROGRESS NOTE                                                                                                                                                                                                             Patient Demographics:    [name], is a 72 y.o. male, DOB - 01/22/1946, TXM:[id number]  Admit date - 04/16/2018   Admitting Physician Louellen Molder, MD  Outpatient Primary MD for the patient is [name], MD  LOS - 3  Outpatient Specialists: Dr Maureen Ralphs, Dr Johnnye Sima  Chief Complaint  Patient presents with  . 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  Wound culture grew E. coli and Enterobacter aerogenous for which she was discharged home on IV Rocephin for 6 weeks duration (stop date 04/28/2018). Patient presented to the ED with shortness of breath and weakness for past 3 days.  Other symptoms include visual hallucinations, poor p.o. intake and decreased mobility. In the ED he was found to be in rapid A. fib, tachypneic and an acute kidney injury. Chest x-ray showed multilobar pneumonia. Admitted to stepdown unit for SIRS with Healthcare associated pneumonia and new onset A. fib with RVR.    Subjective:   Heart rate continued to be elevated past 24 hours and is on maximum dose of Cardizem.  Patient reports feeling tired, having sore throat with some productive cough and has not had a bowel movement since admission.  Assessment  & Plan :    Principal Problem:   Atrial fibrillation with RVR (HCC) Not well controlled on maximum dose of Cardizem  drip since admission.  Cardiology consult appreciated and started on scheduled metoprolol 25 mg twice daily.  CHADS2vasc of 2 (age and elevated blood pressure).  Recommend against newer anticoagulation due to his morbid obesity and anticoagulate with warfarin.  Started on IV heparin with close monitoring of hemoglobin.  Continue stepdown monitoring.  Active Problems:    Active symptoms SIRS (systemic inflammatory response syndrome) ([place]) Healthcare associated pneumonia ([place])  multifocal pneumonia on chest x-ray.  afebrile and blood cultures negative.  Strep urine antigen , Legionella antigen pending.  Continue antitussives and O2 via nasal cannula.  Narrow antibiotics to Levaquin.    Acute kidney injury ([place]) Possibly prerenal due to poor p.o. intake.    With fluids.  Avoid nephrotoxins.      Visual hallucination Possibly due to underlying infection.  On multiple medications which were adjusted.  No further symptoms.      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 Status post I&D on 7/17 with cultures growing E. coli and Enterobacter erogenous.  On IV Rocephin for 6 weeks until 8/28.    Was getting empiric cefepime for pneumonia and now Rocephin resumed.  Obstructive sleep apnea Continue CPAP.    Morbid obesity with BMI of 50.0-59.9, adult ([place]) Needs counseling on weight and exercise to lose weight.  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  Head CT unremarkable.      Anemia of chronic disease Baseline hemoglobin in the range of 8.5-9.5.  Had EGD in 2013 which did not show any bleeding or ulcer.  Monitor closely as he is being started on anti-correlation.  Temporal arteritis Reportedly on tapering dose of prednisone.  Continue for now.      Code Status : Full code  Family Communication  : None at bedside  Disposition Plan  : Pending hospital course and clinical improvement  Barriers For Discharge : Active symptoms.   Continue stepdown monitoring for persistent A. fib with RVR.  Consults  : None  Procedures  : 2D echo  DVT Prophylaxis  : IV heparin  Lab Results  Component Value Date   PLT 186 04/19/2018    Antibiotics  :    Anti-infectives (From admission, onward)   Start     Dose/Rate Route Frequency Ordered Stop   04/19/18 1200  levofloxacin (LEVAQUIN) IVPB 750 mg     750 mg 100 mL/hr over 90 Minutes Intravenous Every 24 hours 04/19/18 1033     04/17/18 1400  vancomycin (VANCOCIN) 1,750 mg in sodium chloride 0.9 % 500 mL IVPB  Status:  Discontinued     1,750 mg 250 mL/hr over 120 Minutes Intravenous Every 24 hours 04/16/18 1703 04/19/18 1029   04/17/18 1300  ceFEPIme (MAXIPIME) 2 g in sodium chloride 0.9 % 100 mL IVPB  Status:  Discontinued     2 g 200 mL/hr over 30 Minutes Intravenous Every 24 hours 04/16/18 1703 04/17/18 1257   04/17/18 1300  ceFEPIme (MAXIPIME) 2 g in sodium chloride 0.9 % 100 mL IVPB  Status:  Discontinued     2 g 200 mL/hr over 30 Minutes Intravenous Every 12 hours 04/17/18 1257 04/19/18 1029   04/16/18 1400  vancomycin (VANCOCIN) 2,000 mg in sodium chloride 0.9 % 500 mL IVPB     2,000 mg 250 mL/hr over 120 Minutes Intravenous  Once 04/16/18 1315 04/16/18 1657   04/16/18 1315  vancomycin (VANCOCIN) IVPB 1000 mg/200 mL premix  Status:  Discontinued     1,000 mg 200 mL/hr over 60 Minutes Intravenous  Once 04/16/18 1304 04/16/18 1314   04/16/18 1315  ceFEPIme (MAXIPIME) 2 g in sodium chloride 0.9 % 100 mL IVPB     2 g 200 mL/hr over 30 Minutes Intravenous  Once 04/16/18 1304 04/16/18 1419        Objective:   Vitals:   04/18/18 2300 04/18/18 2345 04/19/18 0000 04/19/18 0500  BP: (!) 164/81     Pulse: (!) 146 89  Resp: (!) 41 (!) 22    Temp:   98.6 F (37 C) 98 F (36.7 C)  TempSrc:   Oral Oral  SpO2: (!) 77% 96%    Weight:    (!) 169.9 kg  Height:        Wt Readings from Last 3 Encounters:  04/19/18 (!) 169.9 kg  03/17/18 (!) 155.6 kg  02/20/18 (!)  156 kg     Intake/Output Summary (Last 24 hours) at 04/19/2018 1124 Last data filed at 04/19/2018 0500 Gross per 24 hour  Intake 2893.08 ml  Output 2000 ml  Net 893.08 ml   Physical exam Morbidly obese male not in distress HEENT: Moist mucosa, supple neck Chest: Clear bilaterally CVS: S1-S2 irregularly regular, no murmurs GI: Soft, nondistended, nontender Musculoskeletal: Warm, trace pitting edema (improved from yesterday)    Data Review:    CBC Recent Labs  Lab 04/16/18 1248 04/17/18 0408 04/18/18 0410 04/19/18 0351  WBC 7.0 5.3 5.3 6.6  HGB 9.0* 8.0* 8.0* 8.5*  HCT 29.9* 26.6* 27.2* 28.7*  PLT 172 175 184 186  MCV 97.1 97.1 97.8 98.0  MCH 29.2 29.2 28.8 29.0  MCHC 30.1 30.1 29.4* 29.6*  RDW 14.9 15.0 14.9 14.9  LYMPHSABS 0.7  --   --   --   MONOABS 0.9  --   --   --   EOSABS 0.1  --   --   --   BASOSABS 0.0  --   --   --     Chemistries  Recent Labs  Lab 04/16/18 1248 04/17/18 0408 04/18/18 0410 04/19/18 0351  NA 140 140 141 144  K 4.4 4.1 4.2 4.1  CL 105 108 110 110  CO2 25 26 26 27   GLUCOSE 109* 89 151* 124*  BUN 32* 27* 23 19  CREATININE 2.31* 1.64* 1.38* 1.27*  CALCIUM 8.4* 8.1* 8.1* 8.3*   ------------------------------------------------------------------------------------------------------------------ No results for input(s): CHOL, HDL, LDLCALC, TRIG, CHOLHDL, LDLDIRECT in the last 72 hours.  No results found for: HGBA1C ------------------------------------------------------------------------------------------------------------------ Recent Labs    04/16/18 1237  TSH 1.118   ------------------------------------------------------------------------------------------------------------------ Recent Labs    04/16/18 1329  VITAMINB12 1,113*    Coagulation profile No results for input(s): INR, PROTIME in the last 168 hours.  No results for input(s): DDIMER in the last 72 hours.  Cardiac Enzymes Recent Labs  Lab 04/16/18 1248    TROPONINI <0.03   ------------------------------------------------------------------------------------------------------------------    Component Value Date/Time   BNP 453.0 (H) 04/16/2018 1248    Inpatient Medications  Scheduled Meds: . Chlorhexidine Gluconate Cloth  6 each Topical Daily  . cholecalciferol  2,000 Units Oral QPM  . cycloSPORINE  1 drop Both Eyes BID  . darifenacin  15 mg Oral Daily  . docusate sodium  100 mg Oral BID  . famotidine  20 mg Oral Daily  . furosemide  40 mg Oral Daily  . gabapentin  300 mg Oral Daily  . mouth rinse  15 mL Mouth Rinse BID  . methadone  10 mg Oral QID  . metoprolol tartrate  25 mg Oral BID  . nystatin  1 g Topical Daily  . pantoprazole  40 mg Oral Daily  . polyethylene glycol  17 g Oral BID  . predniSONE  7.5 mg Oral BID WC  . sodium chloride flush  10-40 mL Intracatheter Q12H  . venlafaxine XR  300 mg Oral Q breakfast   Continuous Infusions: . sodium chloride 100 mL/hr at 04/18/18 2348  . diltiazem (CARDIZEM)  infusion 15 mg/hr (04/19/18 0432)  . levofloxacin (LEVAQUIN) IV     PRN Meds:.acetaminophen **OR** acetaminophen, bisacodyl, guaiFENesin-dextromethorphan, menthol-cetylpyridinium, ondansetron **OR** ondansetron (ZOFRAN) IV, phenol, sodium chloride flush  Micro Results Recent Results (from the past 240 hour(s))  Blood Culture (routine x 2)     Status: None (Preliminary result)   Collection Time: 04/16/18  1:23 PM  Result Value Ref Range Status   Specimen Description BLOOD RIGHT FOREARM  Final   Special Requests   Final    BOTTLES DRAWN AEROBIC AND ANAEROBIC Blood Culture adequate volume   Culture   Final    NO GROWTH 3 DAYS Performed at [facility], [address] [place], [place] [address]    Report Status PENDING  Incomplete  Blood Culture (routine x 2)     Status: None (Preliminary result)   Collection Time: 04/16/18  1:29 PM  Result Value Ref Range Status   Specimen Description BLOOD RIGHT WRIST  Final    Special Requests   Final    BOTTLES DRAWN AEROBIC AND ANAEROBIC Blood Culture adequate volume   Culture   Final    NO GROWTH 3 DAYS Performed at [facility], [address], [place], [place] [address]    Report Status PENDING  Incomplete  Urine culture     Status: Abnormal   Collection Time: 04/16/18  2:40 PM  Result Value Ref Range Status   Specimen Description   Final    URINE, CLEAN CATCH Performed at [facility], [address], [place], [place] [address]    Special Requests   Final    NONE Performed at [facility], [address] [place], [place] [address]    Culture MULTIPLE SPECIES PRESENT, SUGGEST RECOLLECTION (A)  Final   Report Status 04/17/2018 FINAL  Final  MRSA PCR Screening     Status: None   Collection Time: 04/16/18  5:30 PM  Result Value Ref Range Status   MRSA by PCR NEGATIVE NEGATIVE Final    Comment:        The GeneXpert MRSA Assay (FDA approved for NASAL specimens only), is one component of a comprehensive MRSA colonization surveillance program. It is not intended to diagnose MRSA infection nor to guide or monitor treatment for MRSA infections. Performed at [facility], [address] [place], [place] [address]     Radiology Reports Ct Head Wo Contrast  Result Date: 04/16/2018 CLINICAL DATA:  Shortness of breath on exertion for the past 3 days. Status post left hip replacement 3 weeks ago. Focal neural deficit for greater than 6 hours. EXAM: CT HEAD WITHOUT CONTRAST TECHNIQUE: Contiguous axial images were obtained from the base of the skull through the vertex without intravenous contrast. COMPARISON:  Brain MR dated 01/18/2018 and head CT dated 05/11/2007. FINDINGS: Brain: Mildly enlarged ventricles and cortical sulci. Minimal patchy white matter low density in both cerebral hemispheres. No intracranial hemorrhage, mass lesion or CT evidence of acute infarction. Vascular: No hyperdense vessel or unexpected calcification. Skull: Mild bilateral  hyperostosis frontalis. Sinuses/Orbits: Unremarkable. Other: None. IMPRESSION: 1. No acute abnormality. 2. Mild atrophy and minimal chronic small vessel white matter ischemic changes in both cerebral hemispheres. Electronically Signed   By: Claudie Revering M.D.   On: 04/16/2018 14:22   Dg Chest Portable 1 View  Result Date: 04/16/2018 CLINICAL DATA:  Shortness of breath. Recent left hip replacement 3 weeks ago. EXAM: PORTABLE CHEST 1 VIEW COMPARISON:  03/15/2018 FINDINGS: Lungs are adequately inflated demonstrate airspace consolidation over the right upper  lobe with mild patchy hazy density over the left midlung and right mid to lower lung. Suggestion partial collapse right middle lobe. Small left effusion slightly worse. Stable cardiomegaly. Remainder the exam is unchanged. IMPRESSION: Airspace consolidation right upper lobe with minimal patchy hazy opacification left midlung and right base likely multifocal pneumonia. Slight worsening small left effusion. Partial collapse right middle lobe. Stable cardiomegaly. Electronically Signed   By: Marin Olp M.D.   On: 04/16/2018 12:55   [place] Ekg Site Rite  Result Date: 03/20/2018 If Site Rite image not attached, placement could not be confirmed due to current cardiac rhythm.   Time Spent in minutes  35   Darden Flemister M.D on 04/19/2018 at 11:24 AM  Between 7am to 7pm - Pager - [phone]  After 7pm go to [email or web address] - password [facility]  [facility] -  Office  [phone]

## 2018-04-20 LAB — CBC
HCT: 30.9 % — ABNORMAL LOW (ref 39.0–52.0)
Hemoglobin: 9.1 g/dL — ABNORMAL LOW (ref 13.0–17.0)
MCH: 29 pg (ref 26.0–34.0)
MCHC: 29.4 g/dL — ABNORMAL LOW (ref 30.0–36.0)
MCV: 98.4 fL (ref 78.0–100.0)
Platelets: 194 10*3/uL (ref 150–400)
RBC: 3.14 MIL/uL — ABNORMAL LOW (ref 4.22–5.81)
RDW: 14.9 % (ref 11.5–15.5)
WBC: 7.8 10*3/uL (ref 4.0–10.5)

## 2018-04-20 LAB — HEPARIN LEVEL (UNFRACTIONATED): Heparin Unfractionated: 0.6 IU/mL (ref 0.30–0.70)

## 2018-04-20 MED ORDER — METOPROLOL TARTRATE 50 MG PO TABS
50.0000 mg | ORAL_TABLET | Freq: Three times a day (TID) | ORAL | Status: DC
  Administered 2018-04-20 – 2018-04-21 (×3): 50 mg via ORAL
  Filled 2018-04-20 (×3): qty 1

## 2018-04-20 NOTE — Care Management Important Message (Signed)
Important Message  Patient Details  Name: BENJAMAN ARTMAN MRN: [id number] Date of Birth: 01-25-1946   Medicare Important Message Given:  Yes    Shelda Altes 04/20/2018, 9:44 AM

## 2018-04-20 NOTE — Progress Notes (Signed)
Progress Note  Patient Name: [name] Date of Encounter: 04/20/2018  Primary Cardiologist: Kate Sable, MD   Subjective   Some nausea this morning. No palpitations. SOB is improving.   Inpatient Medications    Scheduled Meds: . Chlorhexidine Gluconate Cloth  6 each Topical Daily  . cholecalciferol  2,000 Units Oral QPM  . cycloSPORINE  1 drop Both Eyes BID  . darifenacin  15 mg Oral Daily  . docusate sodium  100 mg Oral BID  . famotidine  20 mg Oral Daily  . furosemide  40 mg Oral Daily  . gabapentin  300 mg Oral Daily  . mouth rinse  15 mL Mouth Rinse BID  . methadone  10 mg Oral QID  . metoprolol tartrate  25 mg Oral BID  . nystatin  1 g Topical Daily  . pantoprazole  40 mg Oral Daily  . polyethylene glycol  17 g Oral BID  . predniSONE  7.5 mg Oral BID WC  . sodium chloride flush  10-40 mL Intracatheter Q12H  . venlafaxine XR  300 mg Oral Q breakfast   Continuous Infusions: . sodium chloride 100 mL/hr at 04/20/18 0045  . cefTRIAXone (ROCEPHIN)  IV Stopped (04/19/18 1638)  . diltiazem (CARDIZEM) infusion 10 mg/hr (04/20/18 0535)  . heparin 1,600 Units/hr (04/20/18 0045)  . levofloxacin (LEVAQUIN) IV Stopped (04/19/18 1357)   PRN Meds: acetaminophen **OR** acetaminophen, bisacodyl, guaiFENesin-dextromethorphan, menthol-cetylpyridinium, ondansetron **OR** ondansetron (ZOFRAN) IV, phenol, sodium chloride flush   Vital Signs    Vitals:   04/20/18 0530 04/20/18 0545 04/20/18 0600 04/20/18 0615  BP: (!) 166/99 (!) 157/126 (!) 158/112 (!) 167/103  Pulse: (!) 130 (!) 103 98 (!) 105  Resp: (!) 21 17 12 16   Temp:      TempSrc:      SpO2: 93% 94% 96% 93%  Weight:      Height:        Intake/Output Summary (Last 24 hours) at 04/20/2018 0805 Last data filed at 04/20/2018 0535 Gross per 24 hour  Intake 4556.6 ml  Output 2775 ml  Net 1781.6 ml   Filed Weights   04/18/18 0452 04/19/18 0500 04/20/18 0500  Weight: (!) 170.3 kg (!) 169.9 kg (!) 170.5 kg     Telemetry    afib rates 90s-120s - Personally Reviewed  ECG    na  Physical Exam   GEN: No acute distress.   Neck: No JVD Cardiac: irreg, no murmurs, rubs, or gallops.  Respiratory: Clear to auscultation bilaterally. GI: Soft, nontender, non-distended  MS: 1+ bilateral edema.; No deformity. Neuro:  Nonfocal  Psych: Normal affect   Labs    Chemistry Recent Labs  Lab 04/17/18 0408 04/18/18 0410 04/19/18 0351  NA 140 141 144  K 4.1 4.2 4.1  CL 108 110 110  CO2 26 26 27   GLUCOSE 89 151* 124*  BUN 27* 23 19  CREATININE 1.64* 1.38* 1.27*  CALCIUM 8.1* 8.1* 8.3*  GFRNONAA 40* 50* 55*  GFRAA 47* 58* >60  ANIONGAP 6 5 7      Hematology Recent Labs  Lab 04/18/18 0410 04/19/18 0351 04/20/18 0424  WBC 5.3 6.6 7.8  RBC 2.78* 2.93* 3.14*  HGB 8.0* 8.5* 9.1*  HCT 27.2* 28.7* 30.9*  MCV 97.8 98.0 98.4  MCH 28.8 29.0 29.0  MCHC 29.4* 29.6* 29.4*  RDW 14.9 14.9 14.9  PLT 184 186 194    Cardiac Enzymes Recent Labs  Lab 04/16/18 1248  TROPONINI <0.03   No results for  input(s): TROPIPOC in the last 168 hours.   BNP Recent Labs  Lab 04/16/18 1248  BNP 453.0*     DDimer No results for input(s): DDIMER in the last 168 hours.   Radiology    No results found.  Cardiac Studies    Patient Profile  [name] is a 72 y.o. male with a hx of dyspnea on exertion with stress testing in 2015 demonstrating diaphragmatic soft tissue attenuation with no evidence of ischemia, low risk, normal LVEF on echo in 2016 EF 55 to 60% with grade 1 DD, morbid obesity with weights in excess of 600 pounds in the past who is being seen today for the evaluation of atrial fibrillation with RVR at the request of Dr. Clementeen Graham.  Assessment & Plan    1. Afib - admitted with afib with RVR, likely trigerred by pneumonia. Appears to be new diagnosis - had been on dilt gtt, started on lopressor 25mg  bid. Started on heparin drip with plans to transition to oral anticoag pending Hgb  trends, he has had some anemia.   - we will increase lopressor to 50mg  po q6 hr with hold parameters, see if we can wean off the dilt gtt. Continue heparin drip at this time, if Hgb remains stable convert to oral anticoag   2. Postoperative wound infection - hip surgery 02/17/18, subsequently developed wound infection. S/p I/D 03/17/18.   3. Pneumonia - per primary team  4. Anemia - mild uptrend in Hgb from yesterday  For questions or updates, please contact [place] Please consult [email or web address] for contact info under Cardiology/STEMI.      Merrily Pew, MD  04/20/2018, 8:05 AM

## 2018-04-20 NOTE — Progress Notes (Signed)
PROGRESS NOTE                                                                                                                                                                                                             Patient Demographics:    [name], is a 72 y.o. male, DOB - [birth year], ERX:[id number]  Admit date - 04/16/2018   Admitting Physician Louellen Molder, MD  Outpatient Primary MD for the patient is [name], MD  LOS - 4  Outpatient Specialists: Dr Maureen Ralphs, Dr Johnnye Sima  Chief Complaint  Patient presents with  . 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  Wound culture grew E. coli and Enterobacter aerogenous for which she was discharged home on IV Rocephin for 6 weeks duration (stop date 04/28/2018). Patient presented to the ED with shortness of breath and weakness for past 3 days.  Other symptoms include visual hallucinations, poor p.o. intake and decreased mobility. In the ED he was found to be in rapid A. fib, tachypneic and an acute kidney injury. Chest x-ray showed multilobar pneumonia. Admitted to stepdown unit for SIRS with Healthcare associated pneumonia and new onset A. fib with RVR.    Subjective:   Still requiring 10 mg/h of Cardizem drip.  No respiratory symptoms.  Assessment  & Plan :    Principal Problem:   Atrial fibrillation with RVR (HCC) Cardizem drip weaned to 10 mg/h.  Lopressor increased to 50 mg every 6 hours and attempt to wean the drip.  Started on heparin drip yesterday.  Possibly start on Coumadin tomorrow as H&H has  been stable.   Active Problems:    Active symptoms SIRS (systemic inflammatory response syndrome) ([place]) Healthcare associated pneumonia ([place])  multifocal pneumonia on chest x-ray.    afebrile and blood cultures negative.  Strep urine antigen , Legionella antigen pending.  Symptoms improving.  Antibiotics narrowed to Levaquin.  Acute kidney injury ([place]) Possibly prerenal due to poor p.o. intake.    Improved with fluids.     Visual hallucination  Possibly due to underlying infection.  On multiple medications which were adjusted.  No further symptoms.      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 Status post I&D on 7/17 with cultures growing E. coli and Enterobacter erogenous.  On IV Rocephin for 6 weeks until 8/28.    Was getting empiric cefepime for pneumonia and now Rocephin resumed.  Obstructive sleep apnea Continue CPAP.    Morbid obesity with BMI of 50.0-59.9, adult ([place]) Needs counseling on weight and exercise to lose weight.  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  Head CT unremarkable.      Anemia of chronic disease Baseline hemoglobin in the range of 8.5-9.5.  Had EGD in 2013 which did not show any bleeding or ulcer.  Monitor closely while on heparin drip.  Temporal arteritis Reportedly on tapering dose of prednisone.  Continue for now.      Code Status : Full code  Family Communication  : None at bedside  Disposition Plan  : Pending hospital course and clinical improvement  Barriers For Discharge : active symptoms requiring stepdown monitoring for Cardizem drip.  Consults  : None  Procedures  : 2D echo  DVT Prophylaxis  : IV heparin  Lab Results  Component Value Date   PLT 194 04/20/2018    Antibiotics  :    Anti-infectives (From admission, onward)   Start     Dose/Rate Route Frequency Ordered Stop   04/19/18 1300  cefTRIAXone (ROCEPHIN) 2 g in sodium chloride 0.9 % 100 mL IVPB     2  g 200 mL/hr over 30 Minutes Intravenous Every 24 hours 04/19/18 1130     04/19/18 1200  levofloxacin (LEVAQUIN) IVPB 750 mg     750 mg 100 mL/hr over 90 Minutes Intravenous Every 24 hours 04/19/18 1033     04/17/18 1400  vancomycin (VANCOCIN) 1,750 mg in sodium chloride 0.9 % 500 mL IVPB  Status:  Discontinued     1,750 mg 250 mL/hr over 120 Minutes Intravenous Every 24 hours 04/16/18 1703 04/19/18 1029   04/17/18 1300  ceFEPIme (MAXIPIME) 2 g in sodium chloride 0.9 % 100 mL IVPB  Status:  Discontinued     2 g 200 mL/hr over 30 Minutes Intravenous Every 24 hours 04/16/18 1703 04/17/18 1257   04/17/18 1300  ceFEPIme (MAXIPIME) 2 g in sodium chloride 0.9 % 100 mL IVPB  Status:  Discontinued     2 g 200 mL/hr over 30 Minutes Intravenous Every 12 hours 04/17/18 1257 04/19/18 1029   04/16/18 1400  vancomycin (VANCOCIN) 2,000 mg in sodium chloride 0.9 % 500 mL IVPB     2,000 mg 250 mL/hr over 120 Minutes Intravenous  Once 04/16/18 1315 04/16/18 1657   04/16/18 1315  vancomycin (VANCOCIN) IVPB 1000 mg/200 mL premix  Status:  Discontinued     1,000 mg 200 mL/hr over 60 Minutes Intravenous  Once 04/16/18 1304 04/16/18 1314   04/16/18 1315  ceFEPIme (MAXIPIME) 2 g in sodium chloride 0.9 % 100 mL IVPB     2 g 200 mL/hr over 30 Minutes Intravenous  Once 04/16/18 1304 04/16/18 1419        Objective:   Vitals:   04/20/18 0545 04/20/18 0600 04/20/18 0615 04/20/18 0900  BP: (!) 157/126 (!) 158/112 (!) 167/103   Pulse: (!) 103 98 (!) 105   Resp: 17 12 16    Temp:    98.4 F (36.9 C)  TempSrc:    Oral  SpO2: 94% 96% 93%   Weight:      Height:        Wt Readings from Last 3 Encounters:  04/20/18 (!) 170.5 kg  03/17/18 (!) 155.6 kg  02/20/18 (!) 156 kg     Intake/Output Summary (Last 24 hours) at 04/20/2018 1134 Last data filed at 04/20/2018 0535 Gross per 24 hour  Intake 4556.6 ml  Output 1875 ml  Net 2681.6 ml   Physical exam Morbidly obese male not in distress HEENT: Pallor  present, moist mucosa, supple neck Chest: Clear bilaterally CVS: S1 and S2 irregular, no murmurs GI: Soft, nondistended, nontender Musculoskeletal: Warm, trace pitting edema, unchanged from yesterday     Data Review:    CBC Recent Labs  Lab 04/16/18 1248 04/17/18 0408 04/18/18 0410 04/19/18 0351 04/20/18 0424  WBC 7.0 5.3 5.3 6.6 7.8  HGB 9.0* 8.0* 8.0* 8.5* 9.1*  HCT 29.9* 26.6* 27.2* 28.7* 30.9*  PLT 172 175 184 186 194  MCV 97.1 97.1 97.8 98.0 98.4  MCH 29.2 29.2 28.8 29.0 29.0  MCHC 30.1 30.1 29.4* 29.6* 29.4*  RDW 14.9 15.0 14.9 14.9 14.9  LYMPHSABS 0.7  --   --   --   --   MONOABS 0.9  --   --   --   --   EOSABS 0.1  --   --   --   --   BASOSABS 0.0  --   --   --   --     Chemistries  Recent Labs  Lab 04/16/18 1248 04/17/18 0408 04/18/18 0410 04/19/18 0351  NA 140 140 141 144  K 4.4 4.1 4.2 4.1  CL 105 108 110 110  CO2 25 26 26 27   GLUCOSE 109* 89 151* 124*  BUN 32* 27* 23 19  CREATININE 2.31* 1.64* 1.38* 1.27*  CALCIUM 8.4* 8.1* 8.1* 8.3*   ------------------------------------------------------------------------------------------------------------------ No results for input(s): CHOL, HDL, LDLCALC, TRIG, CHOLHDL, LDLDIRECT in the last 72 hours.  No results found for: HGBA1C ------------------------------------------------------------------------------------------------------------------ No results for input(s): TSH, T4TOTAL, T3FREE, THYROIDAB in the last 72 hours.  Invalid input(s): FREET3 ------------------------------------------------------------------------------------------------------------------ No results for input(s): VITAMINB12, FOLATE, FERRITIN, TIBC, IRON, RETICCTPCT in the last 72 hours.  Coagulation profile No results for input(s): INR, PROTIME in the last 168 hours.  No results for input(s): DDIMER in the last 72 hours.  Cardiac Enzymes Recent Labs  Lab 04/16/18 1248  TROPONINI <0.03    ------------------------------------------------------------------------------------------------------------------    Component Value Date/Time   BNP 453.0 (H) 04/16/2018 1248    Inpatient Medications  Scheduled Meds: . Chlorhexidine Gluconate Cloth  6 each Topical Daily  . cholecalciferol  2,000 Units Oral QPM  . cycloSPORINE  1 drop Both Eyes BID  . darifenacin  15 mg Oral Daily  . docusate sodium  100 mg Oral BID  . famotidine  20 mg Oral Daily  . furosemide  40 mg Oral Daily  . gabapentin  300 mg Oral Daily  . mouth rinse  15 mL Mouth Rinse BID  . methadone  10 mg Oral QID  . metoprolol tartrate  50 mg Oral Q8H  . nystatin  1 g Topical Daily  . pantoprazole  40 mg Oral Daily  . polyethylene glycol  17 g Oral BID  . predniSONE  7.5 mg Oral BID WC  . sodium chloride flush  10-40 mL Intracatheter Q12H  . venlafaxine XR  300 mg Oral Q breakfast   Continuous Infusions: . sodium chloride 100 mL/hr at 04/20/18 0045  .  cefTRIAXone (ROCEPHIN)  IV Stopped (04/19/18 1638)  . diltiazem (CARDIZEM) infusion 10 mg/hr (04/20/18 0535)  . heparin 1,600 Units/hr (04/20/18 0045)  . levofloxacin (LEVAQUIN) IV Stopped (04/19/18 1357)   PRN Meds:.acetaminophen **OR** acetaminophen, bisacodyl, guaiFENesin-dextromethorphan, menthol-cetylpyridinium, ondansetron **OR** ondansetron (ZOFRAN) IV, phenol, sodium chloride flush  Micro Results Recent Results (from the past 240 hour(s))  Blood Culture (routine x 2)     Status: None (Preliminary result)   Collection Time: 04/16/18  1:23 PM  Result Value Ref Range Status   Specimen Description BLOOD RIGHT FOREARM  Final   Special Requests   Final    BOTTLES DRAWN AEROBIC AND ANAEROBIC Blood Culture adequate volume   Culture   Final    NO GROWTH 4 DAYS Performed at [facility], [address] [place], [place] [address]    Report Status PENDING  Incomplete  Blood Culture (routine x 2)     Status: None (Preliminary result)   Collection Time:  04/16/18  1:29 PM  Result Value Ref Range Status   Specimen Description BLOOD RIGHT WRIST  Final   Special Requests   Final    BOTTLES DRAWN AEROBIC AND ANAEROBIC Blood Culture adequate volume   Culture   Final    NO GROWTH 4 DAYS Performed at [facility], [address] [place], [place] [address]    Report Status PENDING  Incomplete  Urine culture     Status: Abnormal   Collection Time: 04/16/18  2:40 PM  Result Value Ref Range Status   Specimen Description   Final    URINE, CLEAN CATCH Performed at [facility], [address] [place], [place] [address]    Special Requests   Final    NONE Performed at [facility], [address] [place], [place] [address]    Culture MULTIPLE SPECIES PRESENT, SUGGEST RECOLLECTION (A)  Final   Report Status 04/17/2018 FINAL  Final  MRSA PCR Screening     Status: None   Collection Time: 04/16/18  5:30 PM  Result Value Ref Range Status   MRSA by PCR NEGATIVE NEGATIVE Final    Comment:        The GeneXpert MRSA Assay (FDA approved for NASAL specimens only), is one component of a comprehensive MRSA colonization surveillance program. It is not intended to diagnose MRSA infection nor to guide or monitor treatment for MRSA infections. Performed at [facility], [address], [place],  [address]     Radiology Reports Ct Head Wo Contrast  Result Date: 04/16/2018 CLINICAL DATA:  Shortness of breath on exertion for the past 3 days. Status post left hip replacement 3 weeks ago. Focal neural deficit for greater than 6 hours. EXAM: CT HEAD WITHOUT CONTRAST TECHNIQUE: Contiguous axial images were obtained from the base of the skull through the vertex without intravenous contrast. COMPARISON:  Brain MR dated 01/18/2018 and head CT dated 05/11/2007. FINDINGS: Brain: Mildly enlarged ventricles and cortical sulci. Minimal patchy white matter low density in both cerebral hemispheres. No intracranial hemorrhage, mass lesion or CT evidence of acute  infarction. Vascular: No hyperdense vessel or unexpected calcification. Skull: Mild bilateral hyperostosis frontalis. Sinuses/Orbits: Unremarkable. Other: None. IMPRESSION: 1. No acute abnormality. 2. Mild atrophy and minimal chronic small vessel white matter ischemic changes in both cerebral hemispheres. Electronically Signed   By: Claudie Revering M.D.   On: 04/16/2018 14:22   Dg Chest Portable 1 View  Result Date: 04/16/2018 CLINICAL DATA:  Shortness of breath. Recent left hip replacement 3 weeks ago. EXAM:  PORTABLE CHEST 1 VIEW COMPARISON:  03/15/2018 FINDINGS: Lungs are adequately inflated demonstrate airspace consolidation over the right upper lobe with mild patchy hazy density over the left midlung and right mid to lower lung. Suggestion partial collapse right middle lobe. Small left effusion slightly worse. Stable cardiomegaly. Remainder the exam is unchanged. IMPRESSION: Airspace consolidation right upper lobe with minimal patchy hazy opacification left midlung and right base likely multifocal pneumonia. Slight worsening small left effusion. Partial collapse right middle lobe. Stable cardiomegaly. Electronically Signed   By: Marin Olp M.D.   On: 04/16/2018 12:55    Time Spent in minutes  35   Shareef Eddinger M.D on 04/20/2018 at 11:34 AM  Between 7am to 7pm - Pager - [phone]  After 7pm go to [email or web address] - password [facility]  [facility] -  Office  [phone]

## 2018-04-20 NOTE — Progress Notes (Signed)
[place] for heparin Indication: atrial fibrillation  Allergies  Allergen Reactions  . Bee Venom Swelling    Patient Measurements: Height: 5' 8" (172.7 cm) Weight: (!) 375 lb 14.2 oz (170.5 kg) IBW/kg (Calculated) : 68.4 Heparin Dosing Weight: 110 kg  Vital Signs: Temp: 98.4 F (36.9 C) (08/20 0900) Temp Source: Oral (08/20 0900) BP: 167/103 (08/20 0615) Pulse Rate: 105 (08/20 0615)  Labs: Recent Labs    04/18/18 0410 04/19/18 0351 04/19/18 2003 04/20/18 0424 04/20/18 0854  HGB 8.0* 8.5*  --  9.1*  --   HCT 27.2* 28.7*  --  30.9*  --   PLT 184 186  --  194  --   HEPARINUNFRC  --   --  0.44  --  0.60  CREATININE 1.38* 1.27*  --   --   --     Estimated Creatinine Clearance: 82.4 mL/min (A) (by C-G formula based on SCr of 1.27 mg/dL (H)).   Medical History: Past Medical History:  Diagnosis Date  . Anginal pain ([place]) 2006   evaluated by cardio  . Arthritis    knees,feet,shoulders,elbows.hands  . Constipation   . Dyspnea    with exertion   . Dysrhythmia    Atrial Flutter- 2006- corredted itself  . Family history of adverse reaction to anesthesia    mother- with novocaine went into shock  . GERD (gastroesophageal reflux disease)   . Headache   . Hemorrhoids   . History of blood transfusion   . Hypertension   . Insomnia   . Sleep apnea    cpap  . Temporal giant cell arteritis ([place]) 12/30/2017    Medications:  Medications Prior to Admission  Medication Sig Dispense Refill Last Dose  . acetaminophen (TYLENOL) 325 MG tablet Take 650 mg by mouth every 12 (twelve) hours.   Past Week at Unknown time  . amitriptyline (ELAVIL) 25 MG tablet Take 25 mg by mouth at bedtime.   Past Week at Unknown time  . amLODipine (NORVASC) 10 MG tablet Take 10 mg by mouth daily.   Past Week at Unknown time  . aspirin EC 81 MG tablet Take 1 tablet by mouth daily.   Past Week at Unknown time  . cefTRIAXone (ROCEPHIN) IVPB Inject 2 g into the  vein daily. Indication:  Prosthetic joint nfection Last Day of Therapy:  04/28/18 Labs - Once weekly:  CBC/D and BMP, Labs - Every other week:  ESR and CRP 37 Units 0 Past Week at Unknown time  . celecoxib (CELEBREX) 200 MG capsule Take 1 capsule by mouth 2 (two) times daily.   Past Week at Unknown time  . Cholecalciferol (VITAMIN D3) 2000 units TABS Take 2,000 Units by mouth every evening.    Past Week at Unknown time  . cycloSPORINE (RESTASIS) 0.05 % ophthalmic emulsion Place 1 drop into both eyes 2 (two) times daily.    Past Week at Unknown time  . diphenhydrAMINE (BENADRYL) 25 MG tablet Take 25-50 mg by mouth every 8 (eight) hours as needed for allergies (bee stings).    Past Week at Unknown time  . docusate sodium (COLACE) 100 MG capsule Take 1 capsule (100 mg total) by mouth 2 (two) times daily. 10 capsule 0   . famotidine (PEPCID) 20 MG tablet Take 1 tablet by mouth daily.   Past Week at Unknown time  . furosemide (LASIX) 20 MG tablet Take 1 tablet (20 mg total) by mouth daily as needed for fluid or edema (  as needed for edema.). 20 tablet 0 Past Week at Unknown time  . gabapentin (NEURONTIN) 300 MG capsule Take 900 mg by mouth at bedtime.    Past Week at Unknown time  . gabapentin (NEURONTIN) 300 MG capsule Take 900 mg by mouth at bedtime.   Past Week at Unknown time  . gabapentin (NEURONTIN) 600 MG tablet Take 300 mg by mouth daily.    Past Week at Unknown time  . methadone (DOLOPHINE) 10 MG tablet Take 1 tablet (10 mg total) by mouth 4 (four) times daily. 30 tablet 0 Past Week at Unknown time  . methocarbamol (ROBAXIN) 750 MG tablet Take 750 mg by mouth every 6 (six) hours.    Past Week at Unknown time  . mometasone (ELOCON) 0.1 % cream Apply 1 application topically daily as needed.   Past Week at Unknown time  . Multiple Vitamins-Minerals (CENTRUM ADULTS PO) Take 1 tablet by mouth every evening.    Past Week at Unknown time  . mupirocin ointment (BACTROBAN) 2 % Apply 1 application  topically 3 (three) times daily.   Past Week at Unknown time  . nystatin (MYCOSTATIN/NYSTOP) powder Apply 1 g topically daily. Apply to right and left abdominal folds topically every day shift for rash   Past Week at Unknown time  . omeprazole (PRILOSEC) 40 MG capsule Take 40 mg by mouth every evening.    Past Week at Unknown time  . ondansetron (ZOFRAN) 4 MG tablet Take 1 tablet (4 mg total) by mouth every 6 (six) hours as needed for nausea. 20 tablet 0 Past Week at Unknown time  . oxyCODONE (OXY IR/ROXICODONE) 5 MG immediate release tablet Take 5 mg by mouth every 6 (six) hours as needed for severe pain.   Past Week at Unknown time  . polyethylene glycol (MIRALAX / GLYCOLAX) packet Take 17 g by mouth daily as needed for mild constipation. 14 each 0 Past Week at Unknown time  . predniSONE (DELTASONE) 2.5 MG tablet Take 3 tablets (7.5 mg total) by mouth 2 (two) times daily with a meal. 20 tablet 0 Past Week at Unknown time  . PRESCRIPTION MEDICATION Place 1 application onto the skin at bedtime. Testosterone Cream 10%   Past Week at Unknown time  . Skin Protectants, Misc. (EUCERIN) cream Apply 1 application topically daily.   Past Week at Unknown time  . solifenacin (VESICARE) 10 MG tablet Take 10 mg by mouth daily.   Past Week at Unknown time  . Testosterone Propionate POWD Apply 0.5 mLs topically as directed. Apply 2 clicks (3.5KT) once a day as directed   Past Week at Unknown time  . venlafaxine XR (EFFEXOR-XR) 150 MG 24 hr capsule Take 300 mg by mouth daily with breakfast.   Past Week at Unknown time  . enoxaparin (LOVENOX) 40 MG/0.4ML injection Inject 0.4 mLs (40 mg total) into the skin daily. Inject Lovenox once a day for 10 days after surgery followed by 3 weeks of a baby aspirin once a day. (Patient not taking: Reported on 04/16/2018) 4 Syringe 0 Not Taking at Unknown time    Assessment: Pharmacy consulted to dose heparin for patient with atrial fibrillation. Cardiology does not recommend  NOAC's due to weight.  Will ultimately be candidate for warfarin once heparin drip transitioned.  Heparin level therapeutic at 0.60   Goal of Therapy:  Heparin level 0.3-0.7 units/ml Monitor platelets by anticoagulation protocol: Yes   Plan:  Continue heparin infusion at 1600 units/hr Check anti-Xa level daily while  on heparin Continue to monitor H&H and platelets   Revonda Standard  04/20/2018,10:51 AM

## 2018-04-21 ENCOUNTER — Inpatient Hospital Stay: Admit: 2018-04-21 | Payer: Medicare Other | Admitting: Orthopedic Surgery

## 2018-04-21 DIAGNOSIS — T8149XA Infection following a procedure, other surgical site, initial encounter: Secondary | ICD-10-CM

## 2018-04-21 DIAGNOSIS — T8452XD Infection and inflammatory reaction due to internal left hip prosthesis, subsequent encounter: Secondary | ICD-10-CM

## 2018-04-21 DIAGNOSIS — A419 Sepsis, unspecified organism: Principal | ICD-10-CM

## 2018-04-21 LAB — CBC
HCT: 29 % — ABNORMAL LOW (ref 39.0–52.0)
Hemoglobin: 8.6 g/dL — ABNORMAL LOW (ref 13.0–17.0)
MCH: 29 pg (ref 26.0–34.0)
MCHC: 29.7 g/dL — ABNORMAL LOW (ref 30.0–36.0)
MCV: 97.6 fL (ref 78.0–100.0)
Platelets: 185 10*3/uL (ref 150–400)
RBC: 2.97 MIL/uL — ABNORMAL LOW (ref 4.22–5.81)
RDW: 14.7 % (ref 11.5–15.5)
WBC: 8.9 10*3/uL (ref 4.0–10.5)

## 2018-04-21 LAB — BASIC METABOLIC PANEL
Anion gap: 5 (ref 5–15)
BUN: 17 mg/dL (ref 8–23)
CO2: 29 mmol/L (ref 22–32)
Calcium: 8.3 mg/dL — ABNORMAL LOW (ref 8.9–10.3)
Chloride: 107 mmol/L (ref 98–111)
Creatinine, Ser: 1.09 mg/dL (ref 0.61–1.24)
GFR calc Af Amer: >60 mL/min (ref >60)
GFR calc non Af Amer: >60 mL/min (ref >60)
Glucose, Bld: 114 mg/dL — ABNORMAL HIGH (ref 70–99)
Potassium: 4.4 mmol/L (ref 3.5–5.1)
Sodium: 141 mmol/L (ref 135–145)

## 2018-04-21 LAB — CULTURE, BLOOD (ROUTINE X 2)
Culture: NO GROWTH
Culture: NO GROWTH
Special Requests: ADEQUATE
Special Requests: ADEQUATE

## 2018-04-21 LAB — HEPARIN LEVEL (UNFRACTIONATED): Heparin Unfractionated: 0.56 IU/mL (ref 0.30–0.70)

## 2018-04-21 LAB — PROTIME-INR
INR: 1.26
Prothrombin Time: 15.7 seconds — ABNORMAL HIGH (ref 11.4–15.2)

## 2018-04-21 SURGERY — TOTAL HIP REVISION
Anesthesia: Choice | Site: Hip | Laterality: Left

## 2018-04-21 MED ORDER — METOPROLOL TARTRATE 50 MG PO TABS
50.0000 mg | ORAL_TABLET | Freq: Four times a day (QID) | ORAL | Status: DC
  Administered 2018-04-21 – 2018-04-22 (×5): 50 mg via ORAL
  Filled 2018-04-21 (×4): qty 1

## 2018-04-21 MED ORDER — FUROSEMIDE 10 MG/ML IJ SOLN
40.0000 mg | Freq: Once | INTRAMUSCULAR | Status: AC
  Administered 2018-04-21: 40 mg via INTRAVENOUS
  Filled 2018-04-21: qty 4

## 2018-04-21 MED ORDER — WARFARIN - PHARMACIST DOSING INPATIENT
Freq: Every day | Status: DC
  Administered 2018-04-21: 17:00:00

## 2018-04-21 MED ORDER — WARFARIN SODIUM 5 MG PO TABS
5.0000 mg | ORAL_TABLET | Freq: Once | ORAL | Status: AC
  Administered 2018-04-21: 5 mg via ORAL
  Filled 2018-04-21: qty 1

## 2018-04-21 NOTE — Progress Notes (Signed)
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  Wound culture grew E. coli and Enterobacter aerogenous for which she was discharged home on IV Rocephin for 6 weeks duration (stop date 04/28/2018). Patient presented to the ED with shortness of breath and weakness for past 3 days.  Other symptoms include visual hallucinations, poor p.o. intake and decreased mobility. In the ED he was found to be in rapid A. fib, tachypneic and an acute kidney injury. Chest x-ray showed multilobar pneumonia. Admitted to stepdown unit for Sepsis with Healthcare associated pneumonia and new onset A. fib with RVR.  Assessment & Plan: 1-Atrial fibrillation with RVR ([place]) -new diagnosis -most likely triggered by sepsis -CHADsVASC  Score 2-3 -will continue cardizem drip and b-blocker as per cardiology rec's -hopefully would be able to wean him off diltiazem drip. -also started on coumadin for secondary prevention  2-Hypertension -BP stable -follow VS  3-sepsis due to HCAP -treated with broad spectrum antibiotics, and them levaquin. -patient is afebrile and no complaining of SOB. -will d/c levaquin; patient is receiving extended treatment for wound infection after hip surgery with rocephin.  4-left hip postoperative infection/prothetic joint infection -s/p I and D on 7/17 -cx's positive for E. Coli and enterobacter -continue IV rocephin  -last date of antibiotics 8/28  5-OSA -continue CPAP QHS  6-morbid obesity -Body mass index is 57.19 kg/m. -low calorie diet and  portion control discussed with patient  7-AKI -in the setting of poor hydration and orla intake -improved/resolved with IVF's -follow renal function trend  8-anemia of chronic disease -Hgb baseline 8.5-9.5 -will follow Hgb trend, especially with active anticoagulation transition .  9-temporal arteritis -continue steroids tapering   10-physical deconditioning  -return back to SNF once stable for further rehabilitation.  11-acute on chronic diastolic HF -IV lasix X 1 per cardiology rec's -follow daily weights and strict I's and O's -d/c IVF's and minimize extra volume.  DVT prophylaxis: heparin drip and transitioning to coumadin. Code Status: Full Family Communication: no family at bedside  Disposition Plan: back SNF once stable. For now remains in stepdown; continue weaning him off diltiazem drip. Pharmacy assisting with coumadin transition and heparin drip.  Consultants:   Cardiology   Procedures:   See below for x-ray reports   Antimicrobials:  Anti-infectives (From admission, onward)   Start     Dose/Rate Route Frequency Ordered Stop   04/19/18 1300  cefTRIAXone (ROCEPHIN) 2 g in sodium chloride 0.9 % 100 mL IVPB     2 g 200 mL/hr over 30 Minutes Intravenous Every 24 hours 04/19/18 1130     04/19/18 1200  levofloxacin (LEVAQUIN) IVPB 750 mg  Status:  Discontinued     750 mg 100 mL/hr over 90 Minutes Intravenous Every 24 hours 04/19/18 1033 04/21/18 1201   04/17/18 1400  vancomycin (VANCOCIN) 1,750 mg in sodium chloride 0.9 % 500 mL IVPB  Status:  Discontinued     1,750 mg 250 mL/hr over 120 Minutes Intravenous Every 24 hours 04/16/18 1703 04/19/18 1029   04/17/18 1300  ceFEPIme (MAXIPIME) 2 g in sodium chloride 0.9 % 100 mL IVPB  Status:  Discontinued     2 g 200 mL/hr over 30 Minutes Intravenous Every 24 hours 04/16/18 1703 04/17/18 1257   04/17/18 1300  ceFEPIme (MAXIPIME) 2 g in sodium chloride 0.9 % 100 mL IVPB  Status:  Discontinued     2 g 200 mL/hr over 30  Minutes Intravenous Every 12 hours 04/17/18 1257 04/19/18 1029   04/16/18 1400  vancomycin (VANCOCIN) 2,000 mg in sodium chloride 0.9 % 500 mL IVPB     2,000 mg 250 mL/hr over 120 Minutes Intravenous  Once 04/16/18 1315 04/16/18 1657   04/16/18 1315  vancomycin (VANCOCIN) IVPB 1000 mg/200 mL premix  Status:  Discontinued     1,000 mg 200 mL/hr over 60 Minutes Intravenous  Once 04/16/18 1304 04/16/18 1314   04/16/18 1315  ceFEPIme (MAXIPIME) 2 g in sodium chloride 0.9 % 100 mL IVPB     2 g 200 mL/hr over 30 Minutes Intravenous  Once 04/16/18 1304 04/16/18 1419       Subjective: Afebrile. Feeling better. No CP, no SOB. Still requiring oxygen supplementation and with elevated HR despite cardizem drip and b-blocker therapy.  Objective: Vitals:   04/21/18 0615 04/21/18 0632 04/21/18 0808 04/21/18 1112  BP:  (!) 143/79    Pulse:  (!) 104 (!) 110 (!) 107  Resp: (!) 28 16 (!) 50 18  Temp:   98.7 F (37.1 C) 98 F (36.7 C)  TempSrc:   Oral Oral  SpO2:  95% 91% 96%  Weight:      Height:        Intake/Output Summary (Last 24 hours) at 04/21/2018 1205 Last data filed at 04/21/2018 1610 Gross per 24 hour  Intake 3499.25 ml  Output 1125 ml  Net 2374.25 ml   Filed Weights   04/19/18 0500 04/20/18 0500 04/21/18 0500  Weight: (!) 169.9 kg (!) 170.5 kg (!) 170.6 kg    Examination:  General exam: obese, Alert, awake, oriented x 3; reports no CP or SOB. In no distress. Requiring oxygen supplementation. Respiratory system: good air movement, no wheezing; positive rhonchi; decrease BS at the bases.  Cardiovascular system: irregular, no rubs, no gallops.  Gastrointestinal system: Abdomen is nondistended, soft and nontender. No organomegaly or masses felt. Normal bowel sounds heard. Central nervous system: Alert and oriented. No focal neurological deficits. Extremities: No C/C/E, +pedal pulses Skin: No rashes, lesions or ulcers Psychiatry: Judgement and insight appear normal. Mood & affect  appropriate.    Data Reviewed: I have personally reviewed following labs and imaging studies  CBC: Recent Labs  Lab 04/16/18 1248 04/17/18 0408 04/18/18 0410 04/19/18 0351 04/20/18 0424 04/21/18 0424  WBC 7.0 5.3 5.3 6.6 7.8 8.9  NEUTROABS 5.3  --   --   --   --   --   HGB 9.0* 8.0* 8.0* 8.5* 9.1* 8.6*  HCT 29.9* 26.6* 27.2* 28.7* 30.9* 29.0*  MCV 97.1 97.1 97.8 98.0 98.4 97.6  PLT 172 175 184 186 194 960   Basic Metabolic Panel: Recent Labs  Lab 04/16/18 1248 04/17/18 0408 04/18/18 0410 04/19/18 0351 04/21/18 0424  NA 140 140 141 144 141  K 4.4 4.1 4.2 4.1 4.4  CL 105 108 110 110 107  CO2 25 26 26 27 29   GLUCOSE 109* 89 151* 124* 114*  BUN 32* 27* 23 19 17   CREATININE 2.31* 1.64* 1.38* 1.27* 1.09  CALCIUM 8.4* 8.1* 8.1* 8.3* 8.3*   GFR: Estimated Creatinine Clearance: 96.1 mL/min (by C-G formula based on SCr of 1.09 mg/dL).  Coagulation Profile: Recent Labs  Lab 04/21/18 0904  INR 1.26   Cardiac Enzymes: Recent Labs  Lab 04/16/18 1248  TROPONINI <0.03   Urine analysis:    Component Value Date/Time   COLORURINE YELLOW 04/16/2018 1440   APPEARANCEUR HAZY (A) 04/16/2018 1440   APPEARANCEUR Clear 01/18/2018 1539   LABSPEC 1.021 04/16/2018 1440   PHURINE 5.0 04/16/2018 1440   GLUCOSEU NEGATIVE 04/16/2018 1440   HGBUR NEGATIVE 04/16/2018 1440   BILIRUBINUR NEGATIVE 04/16/2018 1440   BILIRUBINUR Negative 01/18/2018 1539   KETONESUR NEGATIVE 04/16/2018 1440   PROTEINUR 30 (A) 04/16/2018 1440   UROBILINOGEN 0.2 09/06/2012 0213   NITRITE NEGATIVE 04/16/2018 1440   LEUKOCYTESUR MODERATE (A) 04/16/2018 1440   LEUKOCYTESUR Negative 01/18/2018 1539    Recent Results (from the past 240 hour(s))  Blood Culture (routine x 2)     Status: None   Collection Time: 04/16/18  1:23 PM  Result Value Ref Range Status   Specimen Description BLOOD RIGHT FOREARM  Final   Special Requests   Final    BOTTLES DRAWN AEROBIC AND ANAEROBIC Blood Culture adequate volume    Culture   Final    NO GROWTH 5 DAYS Performed at [facility], [address] [place], [place] [address]    Report Status 04/21/2018 FINAL  Final  Blood Culture (routine x 2)     Status: None   Collection Time: 04/16/18  1:29 PM  Result Value Ref Range Status   Specimen Description BLOOD RIGHT WRIST  Final   Special Requests   Final    BOTTLES DRAWN AEROBIC AND ANAEROBIC Blood Culture adequate volume   Culture   Final    NO GROWTH 5 DAYS Performed at [facility], [address], [place], [place] [address]    Report Status 04/21/2018 FINAL  Final  Urine culture     Status: Abnormal   Collection Time: 04/16/18  2:40 PM  Result Value Ref Range Status   Specimen Description   Final    URINE, CLEAN CATCH Performed at [facility], [address], [place], [place] [address]    Special Requests   Final    NONE Performed at [facility], [address] [place], [place] [address]    Culture MULTIPLE SPECIES PRESENT, SUGGEST RECOLLECTION (A)  Final   Report Status 04/17/2018 FINAL  Final  MRSA PCR Screening     Status: None   Collection Time: 04/16/18  5:30 PM  Result Value Ref Range Status   MRSA by PCR NEGATIVE NEGATIVE Final    Comment:        The GeneXpert MRSA Assay (FDA approved for NASAL specimens only), is one component of a comprehensive MRSA colonization surveillance program. It is not intended to diagnose MRSA infection nor to guide or monitor treatment for MRSA infections. Performed at [facility], [address], [place], [place] [address]          Radiology Studies: No results found.      Scheduled Meds: . Chlorhexidine Gluconate Cloth  6 each Topical Daily  . cholecalciferol  2,000 Units Oral QPM  . cycloSPORINE  1 drop Both Eyes BID  . darifenacin  15 mg Oral Daily  . docusate sodium  100 mg Oral BID  . famotidine  20 mg Oral Daily  . gabapentin  300 mg Oral Daily  . mouth rinse  15 mL Mouth Rinse BID  . methadone  10 mg Oral QID  . metoprolol  tartrate  50 mg Oral  Q6H  . nystatin  1 g Topical Daily  . pantoprazole  40 mg Oral Daily  . polyethylene glycol  17 g Oral BID  . predniSONE  7.5 mg Oral BID WC  . sodium chloride flush  10-40 mL Intracatheter Q12H  . venlafaxine XR  300 mg Oral Q breakfast  . warfarin  5 mg Oral Once  . Warfarin - Pharmacist Dosing Inpatient   Does not apply q1800   Continuous Infusions: . cefTRIAXone (ROCEPHIN)  IV Stopped (04/20/18 1426)  . diltiazem (CARDIZEM) infusion 5 mg/hr (04/21/18 0432)  . heparin 1,600 Units/hr (04/20/18 1740)     LOS: 5 days    Time spent: 35 minutes. Greater than 50% of this time was spent in direct contact with the patient, coordinating care and discussing relevant ongoing clinical issues, including elevated HR, need for anticoagulation and current transition point weaning him off cardizem drip. Case was also discussed with specialists involved in his care.     Barton Dubois, MD [facility] Pager [phone]  If 7PM-7AM, please contact night-coverage [email or web address] Password [facility] 04/21/2018, 12:05 PM

## 2018-04-21 NOTE — Progress Notes (Signed)
Progress Note  Patient Name: [name] Date of Encounter: 04/21/2018  Primary Cardiologist: Kate Sable, MD   Subjective   Feels anxious this morning. No significant palpitations. SOB is improving.  Inpatient Medications    Scheduled Meds: . Chlorhexidine Gluconate Cloth  6 each Topical Daily  . cholecalciferol  2,000 Units Oral QPM  . cycloSPORINE  1 drop Both Eyes BID  . darifenacin  15 mg Oral Daily  . docusate sodium  100 mg Oral BID  . famotidine  20 mg Oral Daily  . furosemide  40 mg Oral Daily  . gabapentin  300 mg Oral Daily  . mouth rinse  15 mL Mouth Rinse BID  . methadone  10 mg Oral QID  . metoprolol tartrate  50 mg Oral Q8H  . nystatin  1 g Topical Daily  . pantoprazole  40 mg Oral Daily  . polyethylene glycol  17 g Oral BID  . predniSONE  7.5 mg Oral BID WC  . sodium chloride flush  10-40 mL Intracatheter Q12H  . venlafaxine XR  300 mg Oral Q breakfast   Continuous Infusions: . sodium chloride 100 mL/hr at 04/21/18 0400  . cefTRIAXone (ROCEPHIN)  IV Stopped (04/20/18 1426)  . diltiazem (CARDIZEM) infusion 5 mg/hr (04/21/18 0432)  . heparin 1,600 Units/hr (04/20/18 1740)  . levofloxacin (LEVAQUIN) IV Stopped (04/20/18 1517)   PRN Meds: acetaminophen **OR** acetaminophen, bisacodyl, guaiFENesin-dextromethorphan, menthol-cetylpyridinium, ondansetron **OR** ondansetron (ZOFRAN) IV, phenol, sodium chloride flush   Vital Signs    Vitals:   04/21/18 0545 04/21/18 0600 04/21/18 0615 04/21/18 0632  BP: (!) 125/93   (!) 143/79  Pulse: (!) 101 99  (!) 104  Resp: 17 11 (!) 28 16  Temp:      TempSrc:      SpO2: 97%   95%  Weight:      Height:        Intake/Output Summary (Last 24 hours) at 04/21/2018 0804 Last data filed at 04/21/2018 0600 Gross per 24 hour  Intake 3499.25 ml  Output 725 ml  Net 2774.25 ml   Filed Weights   04/19/18 0500 04/20/18 0500 04/21/18 0500  Weight: (!) 169.9 kg (!) 170.5 kg (!) 170.6 kg    Telemetry    afib  rates 80s to 110s - Personally Reviewed  ECG    na  Physical Exam   GEN: No acute distress.   Neck: No JVD Cardiac: irreg, no murmurs, rubs, or gallops.  Respiratory: Clear to auscultation bilaterally. GI: Soft, nontender, non-distended  MS: 1-2+ bilateral LE edema; No deformity. Neuro:  Nonfocal  Psych: Normal affect   Labs    Chemistry Recent Labs  Lab 04/18/18 0410 04/19/18 0351 04/21/18 0424  NA 141 144 141  K 4.2 4.1 4.4  CL 110 110 107  CO2 26 27 29   GLUCOSE 151* 124* 114*  BUN 23 19 17   CREATININE 1.38* 1.27* 1.09  CALCIUM 8.1* 8.3* 8.3*  GFRNONAA 50* 55* >60  GFRAA 58* >60 >60  ANIONGAP 5 7 5      Hematology Recent Labs  Lab 04/19/18 0351 04/20/18 0424 04/21/18 0424  WBC 6.6 7.8 8.9  RBC 2.93* 3.14* 2.97*  HGB 8.5* 9.1* 8.6*  HCT 28.7* 30.9* 29.0*  MCV 98.0 98.4 97.6  MCH 29.0 29.0 29.0  MCHC 29.6* 29.4* 29.7*  RDW 14.9 14.9 14.7  PLT 186 194 185    Cardiac Enzymes Recent Labs  Lab 04/16/18 1248  TROPONINI <0.03   No results for  input(s): TROPIPOC in the last 168 hours.   BNP Recent Labs  Lab 04/16/18 1248  BNP 453.0*     DDimer No results for input(s): DDIMER in the last 168 hours.   Radiology    No results found.  Cardiac Studies    Patient Profile     GARI HARTSELL a 72 y.o.malewith a hx of dyspnea on exertion with stress testing in 2015 demonstrating diaphragmatic soft tissue attenuation with no evidence of ischemia, low risk, normal LVEF on echo in 2016 EF 55 to 60% with grade 1 DD, morbid obesity with weights in excess of 600 pounds in the pastwho is being seen today for the evaluation of atrial fibrillation with RVRat the request of Dr.Dhungel.  Assessment & Plan    1. Afib - admitted with afib with RVR, likely trigerred by pneumonia. Appears to be new diagnosis - had been on dilt gtt. Started on heparin drip with plans to transition to oral anticoag pending Hgb trends, he has had some anemia.   - yesterday  started lopressor 50mg  po every 8 hrs. Dilt drip down to 5 mg/hr. BP's are stable, start change lopressor to 50mg  every 6 hours and see if can get off dilt drip today. Tachycardia drive should lesses as pneumonia resolves as well - CHADS2Vasc score is 2. BMI is 57. Little data on DOACs and morbid obesity BMI>40, agree with starting coumadin.  - 04/2018 echo LVE 50-55%, mild LAE  2. Postoperative wound infection - hip surgery 02/17/18, subsequently developed wound infection. S/p I/D 03/17/18.   3. Pneumonia - per primary team  4. Anemia - Follow trends.   5. Acute on chronic diastolic HF - LE edema on exam, will give IV lasix 40mg  x 1 today. Had some AKI on admission that has improved, however appears to have some volume overload at this time. D/C IV fluids.     For questions or updates, please contact [place] Please consult [email or web address] for contact info under Cardiology/STEMI.      Merrily Pew, MD  04/21/2018, 8:04 AM

## 2018-04-21 NOTE — Progress Notes (Addendum)
[place] for heparin/warfarin Indication: atrial fibrillation  Allergies  Allergen Reactions  . Bee Venom Swelling    Patient Measurements: Height: 5' 8"  (172.7 cm) Weight: (!) 376 lb 1.7 oz (170.6 kg) IBW/kg (Calculated) : 68.4 Heparin Dosing Weight: 110 kg  Vital Signs: Temp: 98.7 F (37.1 C) (08/21 0808) Temp Source: Oral (08/21 0808) BP: 143/79 (08/21 7672) Pulse Rate: 110 (08/21 0808)  Labs: Recent Labs    04/19/18 0351 04/19/18 2003 04/20/18 0424 04/20/18 0854 04/21/18 0424 04/21/18 0904  HGB 8.5*  --  9.1*  --  8.6*  --   HCT 28.7*  --  30.9*  --  29.0*  --   PLT 186  --  194  --  185  --   LABPROT  --   --   --   --   --  15.7*  INR  --   --   --   --   --  1.26  HEPARINUNFRC  --  0.44  --  0.60  --  0.56  CREATININE 1.27*  --   --   --  1.09  --     Estimated Creatinine Clearance: 96.1 mL/min (by C-G formula based on SCr of 1.09 mg/dL).   Medical History: Past Medical History:  Diagnosis Date  . Anginal pain ([place]) 2006   evaluated by cardio  . Arthritis    knees,feet,shoulders,elbows.hands  . Constipation   . Dyspnea    with exertion   . Dysrhythmia    Atrial Flutter- 2006- corredted itself  . Family history of adverse reaction to anesthesia    mother- with novocaine went into shock  . GERD (gastroesophageal reflux disease)   . Headache   . Hemorrhoids   . History of blood transfusion   . Hypertension   . Insomnia   . Sleep apnea    cpap  . Temporal giant cell arteritis ([place]) 12/30/2017    Medications:  Medications Prior to Admission  Medication Sig Dispense Refill Last Dose  . acetaminophen (TYLENOL) 325 MG tablet Take 650 mg by mouth every 12 (twelve) hours.   Past Week at Unknown time  . amitriptyline (ELAVIL) 25 MG tablet Take 25 mg by mouth at bedtime.   Past Week at Unknown time  . amLODipine (NORVASC) 10 MG tablet Take 10 mg by mouth daily.   Past Week at Unknown time  . aspirin EC 81 MG  tablet Take 1 tablet by mouth daily.   Past Week at Unknown time  . cefTRIAXone (ROCEPHIN) IVPB Inject 2 g into the vein daily. Indication:  Prosthetic joint nfection Last Day of Therapy:  04/28/18 Labs - Once weekly:  CBC/D and BMP, Labs - Every other week:  ESR and CRP 37 Units 0 Past Week at Unknown time  . celecoxib (CELEBREX) 200 MG capsule Take 1 capsule by mouth 2 (two) times daily.   Past Week at Unknown time  . Cholecalciferol (VITAMIN D3) 2000 units TABS Take 2,000 Units by mouth every evening.    Past Week at Unknown time  . cycloSPORINE (RESTASIS) 0.05 % ophthalmic emulsion Place 1 drop into both eyes 2 (two) times daily.    Past Week at Unknown time  . diphenhydrAMINE (BENADRYL) 25 MG tablet Take 25-50 mg by mouth every 8 (eight) hours as needed for allergies (bee stings).    Past Week at Unknown time  . docusate sodium (COLACE) 100 MG capsule Take 1 capsule (100 mg total) by mouth 2 (  two) times daily. 10 capsule 0   . famotidine (PEPCID) 20 MG tablet Take 1 tablet by mouth daily.   Past Week at Unknown time  . furosemide (LASIX) 20 MG tablet Take 1 tablet (20 mg total) by mouth daily as needed for fluid or edema (as needed for edema.). 20 tablet 0 Past Week at Unknown time  . gabapentin (NEURONTIN) 300 MG capsule Take 900 mg by mouth at bedtime.    Past Week at Unknown time  . gabapentin (NEURONTIN) 300 MG capsule Take 900 mg by mouth at bedtime.   Past Week at Unknown time  . gabapentin (NEURONTIN) 600 MG tablet Take 300 mg by mouth daily.    Past Week at Unknown time  . methadone (DOLOPHINE) 10 MG tablet Take 1 tablet (10 mg total) by mouth 4 (four) times daily. 30 tablet 0 Past Week at Unknown time  . methocarbamol (ROBAXIN) 750 MG tablet Take 750 mg by mouth every 6 (six) hours.    Past Week at Unknown time  . mometasone (ELOCON) 0.1 % cream Apply 1 application topically daily as needed.   Past Week at Unknown time  . Multiple Vitamins-Minerals (CENTRUM ADULTS PO) Take 1 tablet by  mouth every evening.    Past Week at Unknown time  . mupirocin ointment (BACTROBAN) 2 % Apply 1 application topically 3 (three) times daily.   Past Week at Unknown time  . nystatin (MYCOSTATIN/NYSTOP) powder Apply 1 g topically daily. Apply to right and left abdominal folds topically every day shift for rash   Past Week at Unknown time  . omeprazole (PRILOSEC) 40 MG capsule Take 40 mg by mouth every evening.    Past Week at Unknown time  . ondansetron (ZOFRAN) 4 MG tablet Take 1 tablet (4 mg total) by mouth every 6 (six) hours as needed for nausea. 20 tablet 0 Past Week at Unknown time  . oxyCODONE (OXY IR/ROXICODONE) 5 MG immediate release tablet Take 5 mg by mouth every 6 (six) hours as needed for severe pain.   Past Week at Unknown time  . polyethylene glycol (MIRALAX / GLYCOLAX) packet Take 17 g by mouth daily as needed for mild constipation. 14 each 0 Past Week at Unknown time  . predniSONE (DELTASONE) 2.5 MG tablet Take 3 tablets (7.5 mg total) by mouth 2 (two) times daily with a meal. 20 tablet 0 Past Week at Unknown time  . PRESCRIPTION MEDICATION Place 1 application onto the skin at bedtime. Testosterone Cream 10%   Past Week at Unknown time  . Skin Protectants, Misc. (EUCERIN) cream Apply 1 application topically daily.   Past Week at Unknown time  . solifenacin (VESICARE) 10 MG tablet Take 10 mg by mouth daily.   Past Week at Unknown time  . Testosterone Propionate POWD Apply 0.5 mLs topically as directed. Apply 2 clicks (1.8HU) once a day as directed   Past Week at Unknown time  . venlafaxine XR (EFFEXOR-XR) 150 MG 24 hr capsule Take 300 mg by mouth daily with breakfast.   Past Week at Unknown time  . enoxaparin (LOVENOX) 40 MG/0.4ML injection Inject 0.4 mLs (40 mg total) into the skin daily. Inject Lovenox once a day for 10 days after surgery followed by 3 weeks of a baby aspirin once a day. (Patient not taking: Reported on 04/16/2018) 4 Syringe 0 Not Taking at Unknown time     Assessment: Pharmacy consulted to dose heparin for patient with atrial fibrillation. Cardiology does not recommend NOAC's due to  weight.  Also, consult to start patient on warfarin.  Heparin level therapeutic at 0.56   Goal of Therapy:  Heparin level 0.3-0.7 units/ml Monitor platelets by anticoagulation protocol: Yes   Plan:  Continue heparin infusion at 1600 units/hr Start warfarin 5 mg x 1 dose. Check INR and anti-Xa level daily Continue to monitor H&H and platelets   Remo Lipps C Tellis Spivak 04/21/2018,10:48 AM

## 2018-04-22 DIAGNOSIS — R410 Disorientation, unspecified: Secondary | ICD-10-CM

## 2018-04-22 LAB — CBC
HCT: 32.3 % — ABNORMAL LOW (ref 39.0–52.0)
Hemoglobin: 9.5 g/dL — ABNORMAL LOW (ref 13.0–17.0)
MCH: 28.5 pg (ref 26.0–34.0)
MCHC: 29.4 g/dL — ABNORMAL LOW (ref 30.0–36.0)
MCV: 97 fL (ref 78.0–100.0)
Platelets: 240 10*3/uL (ref 150–400)
RBC: 3.33 MIL/uL — ABNORMAL LOW (ref 4.22–5.81)
RDW: 14.6 % (ref 11.5–15.5)
WBC: 9.6 10*3/uL (ref 4.0–10.5)

## 2018-04-22 LAB — BASIC METABOLIC PANEL
Anion gap: 9 (ref 5–15)
BUN: 16 mg/dL (ref 8–23)
CO2: 29 mmol/L (ref 22–32)
Calcium: 8.7 mg/dL — ABNORMAL LOW (ref 8.9–10.3)
Chloride: 104 mmol/L (ref 98–111)
Creatinine, Ser: 1.02 mg/dL (ref 0.61–1.24)
GFR calc Af Amer: >60 mL/min (ref >60)
GFR calc non Af Amer: >60 mL/min (ref >60)
Glucose, Bld: 97 mg/dL (ref 70–99)
Potassium: 4.1 mmol/L (ref 3.5–5.1)
Sodium: 142 mmol/L (ref 135–145)

## 2018-04-22 LAB — MAGNESIUM: Magnesium: 1.8 mg/dL (ref 1.7–2.4)

## 2018-04-22 LAB — PROTIME-INR
INR: 1.26
Prothrombin Time: 15.7 seconds — ABNORMAL HIGH (ref 11.4–15.2)

## 2018-04-22 LAB — HEPARIN LEVEL (UNFRACTIONATED): Heparin Unfractionated: 0.65 IU/mL (ref 0.30–0.70)

## 2018-04-22 MED ORDER — WARFARIN SODIUM 7.5 MG PO TABS
7.5000 mg | ORAL_TABLET | Freq: Once | ORAL | Status: DC
  Filled 2018-04-22: qty 1

## 2018-04-22 MED ORDER — METOPROLOL SUCCINATE ER 50 MG PO TB24
100.0000 mg | ORAL_TABLET | Freq: Two times a day (BID) | ORAL | Status: DC
  Administered 2018-04-22 – 2018-04-27 (×10): 100 mg via ORAL
  Filled 2018-04-22 (×10): qty 2

## 2018-04-22 MED ORDER — HALOPERIDOL LACTATE 5 MG/ML IJ SOLN
2.5000 mg | Freq: Four times a day (QID) | INTRAMUSCULAR | Status: DC | PRN
  Administered 2018-04-22: 2.5 mg via INTRAVENOUS
  Filled 2018-04-22: qty 1

## 2018-04-22 MED ORDER — LORAZEPAM 2 MG/ML IJ SOLN
1.0000 mg | Freq: Once | INTRAMUSCULAR | Status: AC
  Administered 2018-04-22: 1 mg via INTRAVENOUS
  Filled 2018-04-22: qty 1

## 2018-04-22 MED ORDER — BUSPIRONE HCL 5 MG PO TABS
5.0000 mg | ORAL_TABLET | Freq: Three times a day (TID) | ORAL | Status: DC
  Administered 2018-04-22 – 2018-04-27 (×12): 5 mg via ORAL
  Filled 2018-04-22 (×15): qty 1

## 2018-04-22 MED ORDER — HALOPERIDOL LACTATE 5 MG/ML IJ SOLN
2.0000 mg | Freq: Once | INTRAMUSCULAR | Status: AC
  Administered 2018-04-22: 2 mg via INTRAMUSCULAR
  Filled 2018-04-22: qty 1

## 2018-04-22 MED ORDER — DILTIAZEM HCL 30 MG PO TABS: 30.0000 mg | ORAL_TABLET | Freq: Three times a day (TID) | ORAL | Status: DC

## 2018-04-22 MED ORDER — DILTIAZEM HCL 60 MG PO TABS
60.0000 mg | ORAL_TABLET | Freq: Three times a day (TID) | ORAL | Status: AC
  Administered 2018-04-23 – 2018-04-26 (×12): 60 mg via ORAL
  Filled 2018-04-22 (×12): qty 1

## 2018-04-22 MED ORDER — HEPARIN SODIUM (PORCINE) 5000 UNIT/ML IJ SOLN
5000.0000 [IU] | Freq: Three times a day (TID) | INTRAMUSCULAR | Status: DC
  Administered 2018-04-22 – 2018-04-27 (×14): 5000 [IU] via SUBCUTANEOUS
  Filled 2018-04-22 (×14): qty 1

## 2018-04-22 MED ORDER — FUROSEMIDE 10 MG/ML IJ SOLN
20.0000 mg | Freq: Once | INTRAMUSCULAR | Status: AC
  Administered 2018-04-22: 20 mg via INTRAVENOUS
  Filled 2018-04-22: qty 2

## 2018-04-22 MED ORDER — METOPROLOL TARTRATE 5 MG/5ML IV SOLN
5.0000 mg | Freq: Once | INTRAVENOUS | Status: AC
  Administered 2018-04-22: 5 mg via INTRAVENOUS
  Filled 2018-04-22: qty 5

## 2018-04-22 NOTE — Progress Notes (Signed)
Pt yelling for help. Staff answered. Pt found on edge of bed naked. PICC line and PIV pulled out. O2 off. Pt still off of monitor. When staff attempted to help pt up in bed pt began swinging at staff and yelling even further. Pt currently in bed covered. Refuses to let staff assist any further. Dr. Dyann Kief made aware.

## 2018-04-22 NOTE — Progress Notes (Signed)
Dr. Dyann Kief in to see pt. New orders received for bilateral soft wrist restraints and Haldol IM. Notified respiratory of pt's 83% O2 sat and will place pt back on CPAP. Pt is still very agitated. Yelling out restless in bed and yelling "please help me." Will attempt to obtain IV access if pt ever calms down.

## 2018-04-22 NOTE — Care Management Note (Signed)
Case Management Note  Patient Details  Name: MASSIMILIANO ROHLEDER MRN: [id number] Date of Birth: 1946-01-02  If discussed at Long Length of Stay Meetings, dates discussed:  04/22/2018  Additional Comments:  Rayleen Wyrick, Chauncey Reading, RN 04/22/2018, 12:22 PM

## 2018-04-22 NOTE — Progress Notes (Signed)
[place] for heparin/warfarin Indication: atrial fibrillation  Allergies  Allergen Reactions  . Bee Venom Swelling    Patient Measurements: Height: 5\' 8"  (172.7 cm) Weight: (!) 364 lb 3.2 oz (165.2 kg) IBW/kg (Calculated) : 68.4 Heparin Dosing Weight: 110 kg  Vital Signs: Temp: 98.8 F (37.1 C) (08/22 0802) Temp Source: Oral (08/22 0802) BP: 137/85 (08/22 0604) Pulse Rate: 106 (08/22 0604)  Labs: Recent Labs    04/20/18 0424 04/20/18 0854 04/21/18 0424 04/21/18 0904 04/22/18 0407  HGB 9.1*  --  8.6*  --  9.5*  HCT 30.9*  --  29.0*  --  32.3*  PLT 194  --  185  --  240  LABPROT  --   --   --  15.7* 15.7*  INR  --   --   --  1.26 1.26  HEPARINUNFRC  --  0.60  --  0.56 0.65  CREATININE  --   --  1.09  --   --     Estimated Creatinine Clearance: 94.2 mL/min (by C-G formula based on SCr of 1.09 mg/dL).    Assessment: Pharmacy consulted to dose heparin for patient with atrial fibrillation. Cardiology does not recommend NOAC's due to weight.  Heparin level is therapeutic this morning, but INR is below desired goal.    Heparin level therapeutic at 0.65 INR: 1.26 today   Goal of Therapy:  Heparin level 0.3-0.7 units/ml Monitor platelets by anticoagulation protocol: Yes   Plan:  Continue heparin infusion at 1600 units/hr Give  Warfarin 7.5 mg x 1 dose. Check INR and anti-Xa level daily Continue to monitor H&H and platelets   Despina Pole, Pharm. D. Clinical Pharmacist 04/22/2018 8:30 AM

## 2018-04-22 NOTE — Progress Notes (Signed)
Called by nurse to see patient due to increase agitation. Patient has pulled IV. Very restless and found with decrease O2 sat. Initially given one dose ativan and started on buspar for agitation. W/o IV's, will give one order for haldol IM and use soft restrains to prevent pulling on his IV. New line to be place by nursing staff. Will follow VS closely. CPAP ordered as he would be more somnolent with given medications. Lasix ordered as per cardiology rec's.   Barton Dubois MD [phone]

## 2018-04-22 NOTE — Progress Notes (Signed)
Pt has been screaming out, cursing at staff and stating he wants to leave, that staff is "keeping him hostage." Pt is physically unable of walking out and gets short of breath just sitting on the side of the bed. Staff explained to pt that he could not yell out because its disturbing to other patients and that he is not in a true emergency. Pt has pulled out O2, leads and continues to due so despite diversion techniques and IV Ativan. MD is aware that pt will not keep leads on. PT did state that he was not going to take medications and then once explained that he needed meds was agreeable. Will continue to monitor.

## 2018-04-22 NOTE — Progress Notes (Signed)
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  Wound culture grew E. coli and Enterobacter aerogenous for which she was discharged home on IV Rocephin for 6 weeks duration (stop date 04/28/2018). Patient presented to the ED with shortness of breath and weakness for past 3 days.  Other symptoms include visual hallucinations, poor p.o. intake and decreased mobility. In the ED he was found to be in rapid A. fib, tachypneic and an acute kidney injury. Chest x-ray showed multilobar pneumonia. Admitted to stepdown unit for Sepsis with Healthcare associated pneumonia and new onset A. fib with RVR.  Assessment & Plan: 1-Atrial fibrillation with RVR ([place]) -new diagnosis -most likely triggered by sepsis -CHADsVASC  Score 2-3 -will continue cardizem drip and b-blocker as per cardiology rec's -hopefully would be able to wean him off diltiazem drip today. -also started on coumadin for secondary prevention  2-Hypertension -BP stable -follow VS  3-sepsis due to HCAP -treated with broad spectrum antibiotics, and them levaquin. -patient is afebrile and no complaining of SOB. -will d/c levaquin; patient is receiving extended treatment for wound infection after hip surgery with rocephin. -encourage to use flutter valve/IS  4-left hip postoperative infection/prothetic joint infection -s/p I and D on 7/17 -cx's positive for E. Coli and enterobacter -continue IV rocephin  -last date of antibiotics 8/28  5-OSA and obesity hypoventilation syndrome  -continue CPAP  QHS  6-morbid obesity -Body mass index is 55.38 kg/m. -low calorie diet and portion control discussed with patient  7-AKI -in the setting of poor hydration and orla intake -improved/resolved with IVF's -follow renal function trend intermittently   8-anemia of chronic disease -Hgb baseline 8.5-9.5 -will follow Hgb trend, especially with active anticoagulation transition .  9-temporal arteritis -continue steroids tapering   10-physical deconditioning  -return back to SNF once stable for further rehabilitation.  11-acute on chronic diastolic HF -IV lasix 20 mg ordered by cardiology rec's -follow daily weights and strict I's and O's -d/c IVF's and minimize extra volume. -follow electrolytes   DVT prophylaxis: heparin drip and transitioning to coumadin. Code Status: Full Family Communication: no family at bedside  Disposition Plan: For now remains in stepdown; continue weaning him off diltiazem drip. Pharmacy assisting with coumadin transition and heparin drip. Patient was recently discharge from SNF and completing treatment at home with [facility] services.  Consultants:   Cardiology   Procedures:   See below for x-ray reports   Antimicrobials:  Anti-infectives (From admission, onward)   Start     Dose/Rate Route Frequency Ordered Stop   04/19/18 1300  cefTRIAXone (ROCEPHIN) 2 g in sodium chloride 0.9 % 100 mL IVPB     2 g 200 mL/hr over 30 Minutes Intravenous Every 24 hours 04/19/18 1130     04/19/18 1200  levofloxacin (LEVAQUIN) IVPB 750 mg  Status:  Discontinued     750 mg 100 mL/hr over 90 Minutes Intravenous Every 24 hours 04/19/18 1033 04/21/18 1201   04/17/18 1400  vancomycin (VANCOCIN) 1,750 mg in sodium chloride 0.9 % 500 mL IVPB  Status:  Discontinued     1,750 mg 250 mL/hr over 120 Minutes Intravenous  Every 24 hours 04/16/18 1703 04/19/18 1029   04/17/18 1300  ceFEPIme (MAXIPIME) 2 g in sodium chloride 0.9 % 100 mL IVPB  Status:  Discontinued     2 g 200 mL/hr  over 30 Minutes Intravenous Every 24 hours 04/16/18 1703 04/17/18 1257   04/17/18 1300  ceFEPIme (MAXIPIME) 2 g in sodium chloride 0.9 % 100 mL IVPB  Status:  Discontinued     2 g 200 mL/hr over 30 Minutes Intravenous Every 12 hours 04/17/18 1257 04/19/18 1029   04/16/18 1400  vancomycin (VANCOCIN) 2,000 mg in sodium chloride 0.9 % 500 mL IVPB     2,000 mg 250 mL/hr over 120 Minutes Intravenous  Once 04/16/18 1315 04/16/18 1657   04/16/18 1315  vancomycin (VANCOCIN) IVPB 1000 mg/200 mL premix  Status:  Discontinued     1,000 mg 200 mL/hr over 60 Minutes Intravenous  Once 04/16/18 1304 04/16/18 1314   04/16/18 1315  ceFEPIme (MAXIPIME) 2 g in sodium chloride 0.9 % 100 mL IVPB     2 g 200 mL/hr over 30 Minutes Intravenous  Once 04/16/18 1304 04/16/18 1419       Subjective: Afebrile, currently no requiring any oxygen supplementation and orbital breathing better.  He is pretty anxious and is asking for medication that can assist him to calm down.  Objective: Vitals:   04/22/18 0802 04/22/18 1137 04/22/18 1617 04/22/18 1647  BP:      Pulse:   (!) 126 (!) 123  Resp:   (!) 27 16  Temp: 98.8 F (37.1 C) 98.2 F (36.8 C) 97.7 F (36.5 C)   TempSrc: Oral Oral Axillary   SpO2:   90% 98%  Weight:      Height:        Intake/Output Summary (Last 24 hours) at 04/22/2018 1925 Last data filed at 04/22/2018 0600 Gross per 24 hour  Intake -  Output 1100 ml  Net -1100 ml   Filed Weights   04/20/18 0500 04/21/18 0500 04/22/18 0604  Weight: (!) 170.5 kg (!) 170.6 kg (!) 165.2 kg    Examination: General exam: Alert, awake, oriented x 2; obese, reported to be very anxious and asking for medication that can assist him calm down.  Also reports intermittent abdominal discomfort.  Oxygen saturation on room air and has remained afebrile. Respiratory system: Positive rhonchi, decreased breath sounds at the bases, no wheezing, no using accessory muscles.   Cardiovascular system: Irregular, no rubs,  no gallops, heart rate is still elevated.  Unable to assess for JVD due to body habitus.   Gastrointestinal system: Obese, nondistended, nontender, positive bowel sounds, no masses felt.   Central nervous system: Alert and oriented. No focal neurological deficits. Extremities: No no cyanosis or clubbing.  1+ edema bilaterally appreciated. Skin: No rashes, no petechiae. Psychiatry: Judgement and insight appear normal. Mood & affect appropriate.   Data Reviewed: I have personally reviewed following labs and imaging studies  CBC: Recent Labs  Lab 04/16/18 1248  04/18/18 0410 04/19/18 0351 04/20/18 0424 04/21/18 0424 04/22/18 0407  WBC 7.0   < > 5.3 6.6 7.8 8.9 9.6  NEUTROABS 5.3  --   --   --   --   --   --   HGB 9.0*   < > 8.0* 8.5* 9.1* 8.6* 9.5*  HCT 29.9*   < > 27.2* 28.7* 30.9* 29.0* 32.3*  MCV 97.1   < > 97.8 98.0 98.4 97.6 97.0  PLT 172   < > 184 186  194 185 240   < > = values in this interval not displayed.   Basic Metabolic Panel: Recent Labs  Lab 04/17/18 0408 04/18/18 0410 04/19/18 0351 04/21/18 0424 04/22/18 0407  NA 140 141 144 141 142  K 4.1 4.2 4.1 4.4 4.1  CL 108 110 110 107 104  CO2 26 26 27 29 29   GLUCOSE 89 151* 124* 114* 97  BUN 27* 23 19 17 16   CREATININE 1.64* 1.38* 1.27* 1.09 1.02  CALCIUM 8.1* 8.1* 8.3* 8.3* 8.7*  MG  --   --   --   --  1.8   GFR: Estimated Creatinine Clearance: 100.6 mL/min (by C-G formula based on SCr of 1.02 mg/dL).  Coagulation Profile: Recent Labs  Lab 04/21/18 0904 04/22/18 0407  INR 1.26 1.26   Cardiac Enzymes: Recent Labs  Lab 04/16/18 1248  TROPONINI <0.03   Urine analysis:    Component Value Date/Time   COLORURINE YELLOW 04/16/2018 1440   APPEARANCEUR HAZY (A) 04/16/2018 1440   APPEARANCEUR Clear 01/18/2018 1539   LABSPEC 1.021 04/16/2018 1440   PHURINE 5.0 04/16/2018 1440   GLUCOSEU NEGATIVE 04/16/2018 1440   HGBUR NEGATIVE 04/16/2018 1440   BILIRUBINUR NEGATIVE 04/16/2018 1440   BILIRUBINUR  Negative 01/18/2018 1539   KETONESUR NEGATIVE 04/16/2018 1440   PROTEINUR 30 (A) 04/16/2018 1440   UROBILINOGEN 0.2 09/06/2012 0213   NITRITE NEGATIVE 04/16/2018 1440   LEUKOCYTESUR MODERATE (A) 04/16/2018 1440   LEUKOCYTESUR Negative 01/18/2018 1539    Recent Results (from the past 240 hour(s))  Blood Culture (routine x 2)     Status: None   Collection Time: 04/16/18  1:23 PM  Result Value Ref Range Status   Specimen Description BLOOD RIGHT FOREARM  Final   Special Requests   Final    BOTTLES DRAWN AEROBIC AND ANAEROBIC Blood Culture adequate volume   Culture   Final    NO GROWTH 5 DAYS Performed at [facility], [address], [place], [place] [address]    Report Status 04/21/2018 FINAL  Final  Blood Culture (routine x 2)     Status: None   Collection Time: 04/16/18  1:29 PM  Result Value Ref Range Status   Specimen Description BLOOD RIGHT WRIST  Final   Special Requests   Final    BOTTLES DRAWN AEROBIC AND ANAEROBIC Blood Culture adequate volume   Culture   Final    NO GROWTH 5 DAYS Performed at [facility], [address] [place], [place] [address]    Report Status 04/21/2018 FINAL  Final  Urine culture     Status: Abnormal   Collection Time: 04/16/18  2:40 PM  Result Value Ref Range Status   Specimen Description   Final    URINE, CLEAN CATCH Performed at [facility], [address], [place], [place] [address]    Special Requests   Final    NONE Performed at [facility], [address], [place], [place] [address]    Culture MULTIPLE SPECIES PRESENT, SUGGEST RECOLLECTION (A)  Final   Report Status 04/17/2018 FINAL  Final  MRSA PCR Screening     Status: None   Collection Time: 04/16/18  5:30 PM  Result Value Ref Range Status   MRSA by PCR NEGATIVE NEGATIVE Final    Comment:        The GeneXpert MRSA Assay (FDA approved for NASAL specimens only), is one component of a comprehensive MRSA colonization surveillance program. It is not intended to diagnose  MRSA infection nor  to guide or monitor treatment for MRSA infections. Performed at [facility], [address] [place], [place] [address]      Radiology Studies: No results found.    Scheduled Meds: . busPIRone  5 mg Oral TID  . Chlorhexidine Gluconate Cloth  6 each Topical Daily  . cholecalciferol  2,000 Units Oral QPM  . cycloSPORINE  1 drop Both Eyes BID  . darifenacin  15 mg Oral Daily  . diltiazem  60 mg Oral TID  . docusate sodium  100 mg Oral BID  . famotidine  20 mg Oral Daily  . gabapentin  300 mg Oral Daily  . heparin  5,000 Units Subcutaneous Q8H  . mouth rinse  15 mL Mouth Rinse BID  . methadone  10 mg Oral QID  . metoprolol succinate  100 mg Oral BID  . nystatin  1 g Topical Daily  . pantoprazole  40 mg Oral Daily  . polyethylene glycol  17 g Oral BID  . predniSONE  7.5 mg Oral BID WC  . sodium chloride flush  10-40 mL Intracatheter Q12H  . venlafaxine XR  300 mg Oral Q breakfast  . warfarin  7.5 mg Oral Once  . Warfarin - Pharmacist Dosing Inpatient   Does not apply q1800   Continuous Infusions: . cefTRIAXone (ROCEPHIN)  IV Stopped (04/22/18 1238)     LOS: 6 days    Time spent: 35 minutes.  Greater than 50% of this time was spent in direct contact with the patient, coordinating care and discussing relevant ongoing clinical issues, including ongoing elevated heart rate, the need for anticoagulation and current transition point out of his Cardizem drip.  Case was discussed with his specialist involving his care and with nursing staff.   Barton Dubois, MD [facility] Pager [phone]  If 7PM-7AM, please contact night-coverage [email or web address] Password [facility] 04/22/2018, 7:25 PM

## 2018-04-22 NOTE — Progress Notes (Signed)
Progress Note  Patient Name: [name] Date of Encounter: 04/22/2018  Primary Cardiologist: Kate Sable, MD   Subjective   Feels anxious. Breathing improving.   Inpatient Medications    Scheduled Meds: . Chlorhexidine Gluconate Cloth  6 each Topical Daily  . cholecalciferol  2,000 Units Oral QPM  . cycloSPORINE  1 drop Both Eyes BID  . darifenacin  15 mg Oral Daily  . docusate sodium  100 mg Oral BID  . famotidine  20 mg Oral Daily  . gabapentin  300 mg Oral Daily  . mouth rinse  15 mL Mouth Rinse BID  . methadone  10 mg Oral QID  . metoprolol tartrate  50 mg Oral Q6H  . nystatin  1 g Topical Daily  . pantoprazole  40 mg Oral Daily  . polyethylene glycol  17 g Oral BID  . predniSONE  7.5 mg Oral BID WC  . sodium chloride flush  10-40 mL Intracatheter Q12H  . venlafaxine XR  300 mg Oral Q breakfast  . Warfarin - Pharmacist Dosing Inpatient   Does not apply q1800   Continuous Infusions: . cefTRIAXone (ROCEPHIN)  IV Stopped (04/21/18 1320)  . diltiazem (CARDIZEM) infusion 2.5 mg/hr (04/21/18 1600)  . heparin 1,600 Units/hr (04/22/18 0306)   PRN Meds: acetaminophen **OR** acetaminophen, bisacodyl, guaiFENesin-dextromethorphan, menthol-cetylpyridinium, ondansetron **OR** ondansetron (ZOFRAN) IV, phenol, sodium chloride flush   Vital Signs    Vitals:   04/22/18 0300 04/22/18 0400 04/22/18 0604 04/22/18 0802  BP:   137/85   Pulse: (!) 109 100 (!) 106   Resp: 18 (!) 39    Temp:   99.2 F (37.3 C) 98.8 F (37.1 C)  TempSrc:   Oral Oral  SpO2: (!) 87% 94%    Weight:   (!) 165.2 kg   Height:        Intake/Output Summary (Last 24 hours) at 04/22/2018 0810 Last data filed at 04/22/2018 0600 Gross per 24 hour  Intake -  Output 5125 ml  Net -5125 ml   Filed Weights   04/20/18 0500 04/21/18 0500 04/22/18 0604  Weight: (!) 170.5 kg (!) 170.6 kg (!) 165.2 kg    Telemetry    afib 90s-110s - Personally Reviewed  ECG    na  Physical Exam   GEN: No  acute distress.   Neck: No JVD Cardiac: irreg, tachy, no murmurs, rubs, or gallops.  Respiratory: Clear to auscultation bilaterally. GI: Soft, nontender, non-distended  MS: trace bilateral edema; No deformity. Neuro:  Nonfocal  Psych: Normal affect   Labs    Chemistry Recent Labs  Lab 04/18/18 0410 04/19/18 0351 04/21/18 0424  NA 141 144 141  K 4.2 4.1 4.4  CL 110 110 107  CO2 26 27 29   GLUCOSE 151* 124* 114*  BUN 23 19 17   CREATININE 1.38* 1.27* 1.09  CALCIUM 8.1* 8.3* 8.3*  GFRNONAA 50* 55* >60  GFRAA 58* >60 >60  ANIONGAP 5 7 5      Hematology Recent Labs  Lab 04/20/18 0424 04/21/18 0424 04/22/18 0407  WBC 7.8 8.9 9.6  RBC 3.14* 2.97* 3.33*  HGB 9.1* 8.6* 9.5*  HCT 30.9* 29.0* 32.3*  MCV 98.4 97.6 97.0  MCH 29.0 29.0 28.5  MCHC 29.4* 29.7* 29.4*  RDW 14.9 14.7 14.6  PLT 194 185 240    Cardiac Enzymes Recent Labs  Lab 04/16/18 1248  TROPONINI <0.03   No results for input(s): TROPIPOC in the last 168 hours.   BNP Recent Labs  Lab  04/16/18 1248  BNP 453.0*     DDimer No results for input(s): DDIMER in the last 168 hours.   Radiology    No results found.  Cardiac Studies    Patient Profile     [name] a 72 y.o.malewith a hx of dyspnea on exertion with stress testing in 2015 demonstrating diaphragmatic soft tissue attenuation with no evidence of ischemia, low risk, normal LVEF on echo in 2016 EF 55 to 60% with grade 1 DD, morbid obesity with weights in excess of 600 pounds in the pastwho is being seen today for the evaluation of atrial fibrillation with RVRat the request of Dr.Dhungel.  Assessment & Plan    1. Afib - admitted with afib with RVR, likely trigerred by pneumonia. Appears to be new diagnosis - had been on dilt gtt, now off. Rate controlled with lopressor 50mg  every 6 hrs. BP's are tolerating. Back on low dose dilt gtt this AM - CHADS2Vasc score is 2. BMI is 57. Little data on DOACs and morbid obesity BMI>40, we have  started coumadin. Does not have to be bridged, we will d/c heparin drip.  - 04/2018 echo LVE 50-55%, mild LAE  - change to toprol xl 100mg  bid, if neccesary can add some oral dilt as well. As pneumonia improves drive for tachycardia should improve, also with some anxiety.   2. Postoperative wound infection - hip surgery 02/17/18, subsequently developed wound infection. S/p I/D 03/17/18.   3. Pneumonia - per primary team  4. Anemia - Follow trends.   5. Acute on chronic diastolic HF -some volume overload yesterday. IVFs stopped, given IV lasix 40mg  x 1. I/Os incomplete, however 5L of UOP documented. Weights report down 5lbs from yesterday if accurate.  - mild fluid overload on exam, f/u labs. May redose low dose IV lasix pending lab results, would just dose 20mg  given his very strong response yesterday to 40mg  IV   For questions or updates, please contact [place] Please consult [email or web address] for contact info under Cardiology/STEMI.      Merrily Pew, MD  04/22/2018, 8:10 AM

## 2018-04-23 ENCOUNTER — Inpatient Hospital Stay (HOSPITAL_COMMUNITY): Payer: Medicare Other

## 2018-04-23 LAB — BASIC METABOLIC PANEL
Anion gap: 9 (ref 5–15)
BUN: 21 mg/dL (ref 8–23)
CO2: 29 mmol/L (ref 22–32)
Calcium: 8.4 mg/dL — ABNORMAL LOW (ref 8.9–10.3)
Chloride: 105 mmol/L (ref 98–111)
Creatinine, Ser: 1.21 mg/dL (ref 0.61–1.24)
GFR calc Af Amer: >60 mL/min (ref >60)
GFR calc non Af Amer: 58 mL/min — ABNORMAL LOW (ref >60)
Glucose, Bld: 106 mg/dL — ABNORMAL HIGH (ref 70–99)
Potassium: 3.8 mmol/L (ref 3.5–5.1)
Sodium: 143 mmol/L (ref 135–145)

## 2018-04-23 LAB — CBC
HCT: 30.7 % — ABNORMAL LOW (ref 39.0–52.0)
Hemoglobin: 9.2 g/dL — ABNORMAL LOW (ref 13.0–17.0)
MCH: 28.6 pg (ref 26.0–34.0)
MCHC: 30 g/dL (ref 30.0–36.0)
MCV: 95.3 fL (ref 78.0–100.0)
Platelets: 195 10*3/uL (ref 150–400)
RBC: 3.22 MIL/uL — ABNORMAL LOW (ref 4.22–5.81)
RDW: 14.7 % (ref 11.5–15.5)
WBC: 9.8 10*3/uL (ref 4.0–10.5)

## 2018-04-23 LAB — PROTIME-INR
INR: 1.36
Prothrombin Time: 16.6 seconds — ABNORMAL HIGH (ref 11.4–15.2)

## 2018-04-23 LAB — HEPARIN LEVEL (UNFRACTIONATED): Heparin Unfractionated: <0.1 IU/mL — ABNORMAL LOW (ref 0.30–0.70)

## 2018-04-23 MED ORDER — WARFARIN VIDEO
Freq: Once | Status: AC
  Administered 2018-04-23: 14:00:00

## 2018-04-23 MED ORDER — WARFARIN SODIUM 7.5 MG PO TABS
7.5000 mg | ORAL_TABLET | Freq: Once | ORAL | Status: AC
  Administered 2018-04-23: 7.5 mg via ORAL
  Filled 2018-04-23: qty 1

## 2018-04-23 MED ORDER — FUROSEMIDE 10 MG/ML IJ SOLN
40.0000 mg | Freq: Once | INTRAMUSCULAR | Status: AC
  Administered 2018-04-23: 40 mg via INTRAVENOUS
  Filled 2018-04-23: qty 4

## 2018-04-23 NOTE — Care Management Important Message (Signed)
Important Message  Patient Details  Name: [name] MRN: [id number] Date of Birth: 09/11/1945   Medicare Important Message Given:  Yes    Rayla Pember, Chauncey Reading, RN 04/23/2018, 11:06 AM

## 2018-04-23 NOTE — Progress Notes (Signed)
[place] for warfarin Indication: atrial fibrillation  Allergies  Allergen Reactions  . Bee Venom Swelling    Patient Measurements: Height: 5\' 8"  (172.7 cm) Weight: (!) 353 lb 2.8 oz (160.2 kg) IBW/kg (Calculated) : 68.4 Heparin Dosing Weight: 110 kg  Vital Signs: Temp: 97.6 F (36.4 C) (08/23 0613) Temp Source: Oral (08/23 3524) BP: 151/65 (08/23 0600) Pulse Rate: 134 (08/23 0600)  Labs: Recent Labs    04/21/18 0424 04/21/18 0904 04/22/18 0407 04/23/18 0512  HGB 8.6*  --  9.5* 9.2*  HCT 29.0*  --  32.3* 30.7*  PLT 185  --  240 195  LABPROT  --  15.7* 15.7* 16.6*  INR  --  1.26 1.26 1.36  HEPARINUNFRC  --  0.56 0.65 <0.10*  CREATININE 1.09  --  1.02 1.21    Estimated Creatinine Clearance: 83.2 mL/min (by C-G formula based on SCr of 1.21 mg/dL).   Assessment: Pharmacy consulted to dose warfarin for patient with atrial fibrillation. Cardiology does not recommend NOAC's due to weight. INR is below desired goal, but trending upward.   INR: 1.36 today   Goal of Therapy:  INR 2-3 Monitor platelets by anticoagulation protocol: Yes   Plan:  Warfarin 7.5 mg x 1 dose. Check INR daily Continue to monitor H&H and platelets  [name], [name], [name] Clinical Pharmacist Pager [phone] 04/23/2018 8:12 AM

## 2018-04-23 NOTE — Progress Notes (Signed)
CXR with some pulmonary edema, we will dose IV lasix 40mg  x 1   Zandra Abts MD

## 2018-04-23 NOTE — Evaluation (Signed)
Physical Therapy Evaluation Patient Details Name: [name] MRN: [id number] DOB: 24-Sep-1945 Today's Date: 04/23/2018   History of Present Illness  [name]  is a 72 y.o. male, with morbid obesity, hypertension, obstructive sleep apnea on CPAP, giant cell arteritis on prednisone taper, chronic diplopia following?  Left eye muscle repair, who underwent left total hip arthroplasty with revision on 02/17/2018 and was discharged to SNF.  He developed postop hematoma and subsequently at SNF was febrile with purulent discharge from his wound with foul order 2 weeks later.  He had a PICC line placed in and was started on vancomycin and Zosyn.  He was unable to ambulate due to pain and then presented to the hospital on 7/15.  He underwent I&D of his wound on 7/17.  The wound culture grew E. coli and Enterobacter Aerogenous.  Clinical Impression  [name] is a 72 yo male who was I with assistive device prior to a Lt THR.  At this time the patient is unable to sit up on his own.  The patient lives alone and will not be safe to return to home.  Therapist recommends SNF placement to improve pt strength and activity tolerance prior to returning home.     Follow Up Recommendations SNF    Equipment Recommendations  None recommended by PT    Recommendations for Other Services   OT    Precautions / Restrictions   falls     Mobility  Bed Mobility Overal bed mobility: Needs Assistance Bed Mobility: Rolling;Supine to Sit Rolling: Max assist   Supine to sit: Total assist;+2 for physical assistance        Transfers    unable              General transfer comment: not tried pt fatigued after exercises requested to stop therapy  Ambulation/Gait    unable                      Pertinent Vitals/Pain Pain Assessment: 0-10 Pain Score: 5  Pain Location: right shoulder when trying to pull self to Lt sidelying postion.  Pain Descriptors / Indicators: Throbbing;Sharp Pain  Intervention(s): Limited activity within patient's tolerance    Home Living Family/patient expects to be discharged to:: Private residence Living Arrangements: Alone Available Help at Discharge: Available PRN/intermittently;Family Type of Home: House Home Access: Stairs to enter     Home Layout: One level Home Equipment: Environmental consultant - 2 wheels      Prior Function    PT lives at home alone.  His sister lives across the street from him and  Looks in on him daily              Hand Dominance        Extremity/Trunk Assessment        Lower Extremity Assessment Lower Extremity Assessment: RLE deficits/detail;LLE deficits/detail RLE Deficits / Details: Rt LE strength 2 to 2+/5; Rt 2-/5 for hip mm       Communication   Communication: No difficulties  Cognition Arousal/Alertness: Lethargic Behavior During Therapy: WFL for tasks assessed/performed Overall Cognitive Status: Within Functional Limits for tasks assessed                                        General Comments      Exercises Total Joint Exercises Ankle Circles/Pumps: Right;Left;10 reps Quad Sets: Right;Left;10 reps Gluteal  Sets: Both;10 reps Heel Slides: Both;10 reps Straight Leg Raises: Both;10 reps Bridges: (attempted bridging )   Assessment/Plan    PT Assessment Patient needs continued PT services  PT Problem List Decreased strength;Decreased activity tolerance;Decreased range of motion;Decreased mobility;Decreased balance;Obesity;Pain       PT Treatment Interventions Functional mobility training;Therapeutic activities;Therapeutic exercise;Gait training;Patient/family education    PT Goals (Current goals can be found in the Care Plan section)       Frequency Min 5X/week              End of Session Equipment Utilized During Treatment: Oxygen Activity Tolerance: Patient limited by fatigue Patient left: in bed Nurse Communication: Mobility status PT Visit Diagnosis: Muscle  weakness (generalized) (M62.81);Difficulty in walking, not elsewhere classified (R26.2);Pain Pain - Right/Left: Right Pain - part of body: Shoulder;Hip    Time: 1330-1400 PT Time Calculation (min) (ACUTE ONLY): 30 min   Charges:  Leamon Arnt, PT CLT [phone] 04/23/2018, 2:14 PM

## 2018-04-23 NOTE — Consult Note (Addendum)
Progress Note  Patient Name: [name] Date of Encounter: 04/23/2018  Primary Cardiologist: Kate Sable, MD   Subjective   No complaints, sedated this AM  Inpatient Medications    Scheduled Meds: . busPIRone  5 mg Oral TID  . Chlorhexidine Gluconate Cloth  6 each Topical Daily  . cholecalciferol  2,000 Units Oral QPM  . cycloSPORINE  1 drop Both Eyes BID  . darifenacin  15 mg Oral Daily  . diltiazem  60 mg Oral TID  . docusate sodium  100 mg Oral BID  . famotidine  20 mg Oral Daily  . gabapentin  300 mg Oral Daily  . heparin  5,000 Units Subcutaneous Q8H  . mouth rinse  15 mL Mouth Rinse BID  . methadone  10 mg Oral QID  . metoprolol succinate  100 mg Oral BID  . nystatin  1 g Topical Daily  . pantoprazole  40 mg Oral Daily  . polyethylene glycol  17 g Oral BID  . predniSONE  7.5 mg Oral BID WC  . sodium chloride flush  10-40 mL Intracatheter Q12H  . venlafaxine XR  300 mg Oral Q breakfast  . warfarin  7.5 mg Oral Once  . warfarin  7.5 mg Oral Once  . Warfarin - Pharmacist Dosing Inpatient   Does not apply q1800   Continuous Infusions: . cefTRIAXone (ROCEPHIN)  IV Stopped (04/22/18 1238)   PRN Meds: acetaminophen **OR** acetaminophen, bisacodyl, guaiFENesin-dextromethorphan, haloperidol lactate, menthol-cetylpyridinium, ondansetron **OR** ondansetron (ZOFRAN) IV, phenol, sodium chloride flush   Vital Signs    Vitals:   04/23/18 0500 04/23/18 0600 04/23/18 0613 04/23/18 0818  BP: (!) 206/172 (!) 151/65  (!) 151/85  Pulse: 100 (!) 134    Resp: 13 (!) 33    Temp:   97.6 F (36.4 C)   TempSrc:   Oral   SpO2: 100% (!) 81%    Weight:   (!) 160.2 kg   Height:        Intake/Output Summary (Last 24 hours) at 04/23/2018 0842 Last data filed at 04/22/2018 2300 Gross per 24 hour  Intake 206.67 ml  Output -  Net 206.67 ml   Filed Weights   04/21/18 0500 04/22/18 0604 04/23/18 0613  Weight: (!) 170.6 kg (!) 165.2 kg (!) 160.2 kg    Telemetry      afib 110s - Personally Reviewed  ECG    na  Physical Exam   GEN: No acute distress.   Neck: No JVD Cardiac: irreg, no murmurs, rubs, or gallops.  Respiratory: Clear to auscultation bilaterally. GI: Soft, nontender, non-distended  MS: No edema; No deformity. Neuro:  Nonfocal  Psych: Normal affect   Labs    Chemistry Recent Labs  Lab 04/21/18 0424 04/22/18 0407 04/23/18 0512  NA 141 142 143  K 4.4 4.1 3.8  CL 107 104 105  CO2 29 29 29   GLUCOSE 114* 97 106*  BUN 17 16 21   CREATININE 1.09 1.02 1.21  CALCIUM 8.3* 8.7* 8.4*  GFRNONAA >60 >60 58*  GFRAA >60 >60 >60  ANIONGAP 5 9 9      Hematology Recent Labs  Lab 04/21/18 0424 04/22/18 0407 04/23/18 0512  WBC 8.9 9.6 9.8  RBC 2.97* 3.33* 3.22*  HGB 8.6* 9.5* 9.2*  HCT 29.0* 32.3* 30.7*  MCV 97.6 97.0 95.3  MCH 29.0 28.5 28.6  MCHC 29.7* 29.4* 30.0  RDW 14.7 14.6 14.7  PLT 185 240 195    Cardiac Enzymes Recent Labs  Lab  04/16/18 1248  TROPONINI <0.03   No results for input(s): TROPIPOC in the last 168 hours.   BNP Recent Labs  Lab 04/16/18 1248  BNP 453.0*     DDimer No results for input(s): DDIMER in the last 168 hours.   Radiology    No results found.  Cardiac Studies    Patient Profile     [name] a 72 y.o.malewith a hx of dyspnea on exertion with stress testing in 2015 demonstrating diaphragmatic soft tissue attenuation with no evidence of ischemia, low risk, normal LVEF on echo in 2016 EF 55 to 60% with grade 1 DD, morbid obesity with weights in excess of 600 pounds in the pastwho is being seen today for the evaluation of atrial fibrillation with RVRat the request of Dr.Dhungel.  Assessment & Plan    1. Afib - admitted with afib with RVR, likely trigerred by pneumonia. Appears to be new diagnosis - had been on dilt gtt, now off. Rate controlled with Toprol 100mg  bid, dilt 60mg  tid (only one dose received yesterday due to bp's and agitation) . . BP's are tolerating. Has  had some significant issues with agitation which is also likely contributing.  - CHADS2Vasc score is 2. BMI is 57. Little data on DOACs and morbid obesity BMI>40,he has started coumadin - 04/2018 echo LVE 50-55%, mild LAE  - follow rates today, He did not get all of his dilt doses yesterday, follow with dosing today. May titrate dilt further if needed pending bp's.    2. Postoperative wound infection - hip surgery 02/17/18, subsequently developed wound infection. S/p I/D 03/17/18.   3. Pneumonia - per primary team  4. Anemia - Follow trends.   5. Acute on chronic diastolic HF - incomplete I/Os, he did received lasix 20mg  IV x 1 yesterday.  - mild uptrend in Cr, LE edema much improved. No additional diuretics today.    1010 addendum Discussed patient with Dr Dyann Kief. Some ongoing SOB, his body habitus makes examining her fluid status difficult. His LE edema is much improved. We will obtain a CXR, pending results would anticipate redosign IV lasix 40mg  x1.   For questions or updates, please contact [place] Please consult [email or web address] for contact info under Cardiology/STEMI.      Merrily Pew, MD  04/23/2018, 8:42 AM

## 2018-04-23 NOTE — Care Management (Signed)
Patient is from home. Active with [facility] for RN, PT, OT, and SW. He was receiving IV antibiotics at home via PICC.  He did have a recent stay at [facility].  Anticipate a PT eval prior to DC.

## 2018-04-23 NOTE — Progress Notes (Signed)
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  Wound culture grew E. coli and Enterobacter aerogenous for which she was discharged home on IV Rocephin for 6 weeks duration (stop date 04/28/2018). Patient presented to the ED with shortness of breath and weakness for past 3 days.  Other symptoms include visual hallucinations, poor p.o. intake and decreased mobility. In the ED he was found to be in rapid A. fib, tachypneic and an acute kidney injury. Chest x-ray showed multilobar pneumonia. Admitted to stepdown unit for Sepsis with Healthcare associated pneumonia and new onset A. fib with RVR.  Assessment & Plan: 1-Atrial fibrillation with RVR ([place]) -new diagnosis -most likely triggered by sepsis -CHADsVASC  Score 2-3 -will continue adjusted dose of b-blocker and the need of oral cardizem as per cardiology rec's -continue building up coumadin for secondary prevention  2-Hypertension -BP stable -follow VS  3-sepsis due to HCAP -treated with broad spectrum antibiotics, and them levaquin. -patient is afebrile and no complaining of SOB. -will d/c levaquin; patient is receiving extended treatment for wound infection after hip surgery with rocephin. -continue to use flutter valve/IS  4-left hip postoperative infection/prothetic joint infection -s/p I and D on 7/17 -cx's positive for E. Coli and enterobacter -continue IV rocephin  -last date of antibiotics 8/28 -outpatient follow up with orthopedic service -left hip still with mild serosanguineous  suppurative wound  5-OSA and obesity hypoventilation syndrome  -continue CPAP QHS  6-morbid obesity -Body mass index is 53.7 kg/m. -low calorie diet and portion control discussed with patient  7-AKI -in the setting of poor hydration and oral intake -improved/resolved with IVF's -follow renal function trend intermittently, especially now that he will need lasix.  8-anemia of chronic disease -Hgb baseline 8.5-9.5 -will follow Hgb trend, especially with active anticoagulation transition . -no signs of overt bleeding   9-temporal arteritis -continue steroids tapering   10-physical deconditioning  -per PT will to return back to SNF at discharge for further rehabilitation.  11-acute on chronic diastolic HF -IV lasix 40 mg ordered by cardiology rec's -CXR demonstrating vascular congestion and mild interstitial edema. -follow daily weights and strict I's and O's -follow electrolytes   12-hospital acquired delirium  -improved after ativan and haldol -oriented X2 today -continue constant reorientation -follow mental status.    DVT prophylaxis: heparin drip and transitioning to coumadin. Code Status: Full Family Communication: ex-wife at bedside; clear by patient to provide info.  Disposition Plan: For now remains in stepdown; watch HR closely, patient is now off drip. Continue weaning oxygen supplementation. Treat with lasix for vascular congestion. Continue building up coumadin. Per PT will need SNF at discharge.  Consultants:   Cardiology   Procedures:   See below for x-ray reports   Antimicrobials:  Anti-infectives (From admission, onward)   Start     Dose/Rate Route Frequency Ordered Stop   04/19/18 1300  cefTRIAXone (ROCEPHIN) 2 g in sodium chloride 0.9 % 100 mL IVPB     2 g 200 mL/hr over 30 Minutes Intravenous Every 24 hours 04/19/18 1130     04/19/18 1200  levofloxacin (LEVAQUIN) IVPB 750 mg  Status:  Discontinued     750 mg 100 mL/hr over 90 Minutes  Intravenous Every 24 hours 04/19/18 1033 04/21/18 1201   04/17/18 1400  vancomycin (VANCOCIN) 1,750 mg in sodium chloride 0.9 % 500 mL IVPB  Status:  Discontinued     1,750 mg 250 mL/hr over 120 Minutes Intravenous Every 24 hours 04/16/18 1703 04/19/18 1029   04/17/18 1300  ceFEPIme (MAXIPIME) 2 g in sodium chloride 0.9 % 100 mL IVPB  Status:  Discontinued     2 g 200 mL/hr over 30 Minutes Intravenous Every 24 hours 04/16/18 1703 04/17/18 1257   04/17/18 1300  ceFEPIme (MAXIPIME) 2 g in sodium chloride 0.9 % 100 mL IVPB  Status:  Discontinued     2 g 200 mL/hr over 30 Minutes Intravenous Every 12 hours 04/17/18 1257 04/19/18 1029   04/16/18 1400  vancomycin (VANCOCIN) 2,000 mg in sodium chloride 0.9 % 500 mL IVPB     2,000 mg 250 mL/hr over 120 Minutes Intravenous  Once 04/16/18 1315 04/16/18 1657   04/16/18 1315  vancomycin (VANCOCIN) IVPB 1000 mg/200 mL premix  Status:  Discontinued     1,000 mg 200 mL/hr over 60 Minutes Intravenous  Once 04/16/18 1304 04/16/18 1314   04/16/18 1315  ceFEPIme (MAXIPIME) 2 g in sodium chloride 0.9 % 100 mL IVPB     2 g 200 mL/hr over 30 Minutes Intravenous  Once 04/16/18 1304 04/16/18 1419       Subjective: Afebrile, AAOX2, in no major distress. Following commands appropriately. Denies CP, nausea, vomiting and palpitations. Still with irregular rate and rhythm on cardiac monitoring. Off diltiazem drip since yesterday evening. He expressed no full recollections of yesterday events and his behavior.  Objective: Vitals:   04/23/18 1945 04/23/18 2000 04/23/18 2015 04/23/18 2100  BP:  98/76  96/68  Pulse: (!) 128 (!) 112 (!) 111 (!) 109  Resp: (!) 23 (!) 21 17 14   Temp:  99.1 F (37.3 C)    TempSrc:  Oral    SpO2: 90% 96% 97% 92%  Weight:      Height:        Intake/Output Summary (Last 24 hours) at 04/23/2018 2153 Last data filed at 04/23/2018 2000 Gross per 24 hour  Intake 866.67 ml  Output 250 ml  Net 616.67 ml   Filed Weights   04/21/18  0500 04/22/18 0604 04/23/18 0613  Weight: (!) 170.6 kg (!) 165.2 kg (!) 160.2 kg    Examination: General exam: Alert, awake, oriented x 2; patient is calmed and reported no much recollection of yesterday events. He denies CP, reported some SOB (especially with exertion), no nausea, no vomiting, no fever. Respiratory system: positive rhonchi, decrease BS at bases, no using accessory muscles. Using O2 supplementation.  Cardiovascular system: irregular, tachycardic. No rubs or gallops. Gastrointestinal system: Abdomen is obese, nondistended, soft and nontender. No organomegaly or masses felt. Normal bowel sounds heard. Central nervous system: Alert and oriented. No focal neurological or motor deficits. Patient is very weak and deconditioned. Decrease strength due to poor effort.   Extremities: No Cyanosis, no clubbing; improvement in swelling; but still with trace edema bilaterally.. Left hip scar without superimposed redness, post operative wound still with active serosanguineous suppuration. Skin: No rashes, no petechiae.  Psychiatry: Judgement and insight appear normal. Mood & affect flat.    Data Reviewed: I have personally reviewed following labs and imaging studies  CBC: Recent Labs  Lab 04/19/18 0351 04/20/18 0424 04/21/18 0424 04/22/18 0407 04/23/18 2683  WBC 6.6 7.8 8.9 9.6 9.8  HGB 8.5* 9.1* 8.6* 9.5* 9.2*  HCT 28.7* 30.9* 29.0* 32.3* 30.7*  MCV 98.0 98.4 97.6 97.0 95.3  PLT 186 194 185 240 347   Basic Metabolic Panel: Recent Labs  Lab 04/18/18 0410 04/19/18 0351 04/21/18 0424 04/22/18 0407 04/23/18 0512  NA 141 144 141 142 143  K 4.2 4.1 4.4 4.1 3.8  CL 110 110 107 104 105  CO2 26 27 29 29 29   GLUCOSE 151* 124* 114* 97 106*  BUN 23 19 17 16 21   CREATININE 1.38* 1.27* 1.09 1.02 1.21  CALCIUM 8.1* 8.3* 8.3* 8.7* 8.4*  MG  --   --   --  1.8  --    GFR: Estimated Creatinine Clearance: 83.2 mL/min (by C-G formula based on SCr of 1.21 mg/dL).  Coagulation  Profile: Recent Labs  Lab 04/21/18 0904 04/22/18 0407 04/23/18 0512  INR 1.26 1.26 1.36   Urine analysis:    Component Value Date/Time   COLORURINE YELLOW 04/16/2018 1440   APPEARANCEUR HAZY (A) 04/16/2018 1440   APPEARANCEUR Clear 01/18/2018 1539   LABSPEC 1.021 04/16/2018 1440   PHURINE 5.0 04/16/2018 1440   GLUCOSEU NEGATIVE 04/16/2018 1440   HGBUR NEGATIVE 04/16/2018 1440   BILIRUBINUR NEGATIVE 04/16/2018 1440   BILIRUBINUR Negative 01/18/2018 1539   KETONESUR NEGATIVE 04/16/2018 1440   PROTEINUR 30 (A) 04/16/2018 1440   UROBILINOGEN 0.2 09/06/2012 0213   NITRITE NEGATIVE 04/16/2018 1440   LEUKOCYTESUR MODERATE (A) 04/16/2018 1440   LEUKOCYTESUR Negative 01/18/2018 1539    Recent Results (from the past 240 hour(s))  Blood Culture (routine x 2)     Status: None   Collection Time: 04/16/18  1:23 PM  Result Value Ref Range Status   Specimen Description BLOOD RIGHT FOREARM  Final   Special Requests   Final    BOTTLES DRAWN AEROBIC AND ANAEROBIC Blood Culture adequate volume   Culture   Final    NO GROWTH 5 DAYS Performed at [facility], [address] [place], [place] [address]    Report Status 04/21/2018 FINAL  Final  Blood Culture (routine x 2)     Status: None   Collection Time: 04/16/18  1:29 PM  Result Value Ref Range Status   Specimen Description BLOOD RIGHT WRIST  Final   Special Requests   Final    BOTTLES DRAWN AEROBIC AND ANAEROBIC Blood Culture adequate volume   Culture   Final    NO GROWTH 5 DAYS Performed at [facility], [address] [place], [place] [address]    Report Status 04/21/2018 FINAL  Final  Urine culture     Status: Abnormal   Collection Time: 04/16/18  2:40 PM  Result Value Ref Range Status   Specimen Description   Final    URINE, CLEAN CATCH Performed at [facility], [address] [place],  [address]    Special Requests   Final    NONE Performed at [facility], [address] [place],  [address]    Culture  MULTIPLE SPECIES PRESENT, SUGGEST RECOLLECTION (A)  Final   Report Status 04/17/2018 FINAL  Final  MRSA PCR Screening     Status: None   Collection Time: 04/16/18  5:30 PM  Result Value Ref Range Status   MRSA by PCR NEGATIVE NEGATIVE Final    Comment:        The GeneXpert MRSA Assay (FDA approved for NASAL specimens only), is one component of a comprehensive MRSA colonization surveillance  program. It is not intended to diagnose MRSA infection nor to guide or monitor treatment for MRSA infections. Performed at [facility], [address], [place], [place] [address]      Radiology Studies: Dg Chest [facility] 1 View  Result Date: 04/23/2018 CLINICAL DATA:  Dyspnea, hypertension, ex smoker. EXAM: PORTABLE CHEST 1 VIEW COMPARISON:  04/16/2018 FINDINGS: Stable cardiomegaly with tortuous atherosclerotic aorta. Pulmonary vascular redistribution is identified. Minimal clearing of right upper lobe airspace disease. Persistent left mid and bibasilar airspace disease, atelectasis and/or scarring slightly increased at the right lung base. No acute osseous abnormality. IMPRESSION: 1. Cardiomegaly with aortic atherosclerosis. 2. Central vascular congestion consistent with mild CHF. 3. Pulmonary opacities in the right upper lobe have partially cleared. Persistent bibasilar and left mid lung airspace opacities, atelectasis and/or scarring persist, slightly increased at the right lung base. Electronically Signed   By: Ashley Royalty M.D.   On: 04/23/2018 13:18      Scheduled Meds: . busPIRone  5 mg Oral TID  . Chlorhexidine Gluconate Cloth  6 each Topical Daily  . cholecalciferol  2,000 Units Oral QPM  . cycloSPORINE  1 drop Both Eyes BID  . darifenacin  15 mg Oral Daily  . diltiazem  60 mg Oral TID  . docusate sodium  100 mg Oral BID  . famotidine  20 mg Oral Daily  . gabapentin  300 mg Oral Daily  . heparin  5,000 Units Subcutaneous Q8H  . mouth rinse  15 mL Mouth Rinse BID  . methadone  10 mg Oral  QID  . metoprolol succinate  100 mg Oral BID  . nystatin  1 g Topical Daily  . pantoprazole  40 mg Oral Daily  . polyethylene glycol  17 g Oral BID  . predniSONE  7.5 mg Oral BID WC  . sodium chloride flush  10-40 mL Intracatheter Q12H  . venlafaxine XR  300 mg Oral Q breakfast  . warfarin  7.5 mg Oral Once  . Warfarin - Pharmacist Dosing Inpatient   Does not apply q1800   Continuous Infusions: . cefTRIAXone (ROCEPHIN)  IV Stopped (04/23/18 1441)     LOS: 7 days    Time spent: 35 minutes. Greater than 50% of this time was spent in direct contact with the patient, coordinating care and discussing relevant ongoing clinical issues, including ongoing clinical issues, including ongoing elevated HR, delirium, increase weakness, need for anticoagulation. Case discussed with specialist involving his care and with nursing staff. Family updated at bedside.    Barton Dubois, MD [facility] Pager [phone]  If 7PM-7AM, please contact night-coverage [email or web address] Password [email or web address] 04/23/2018, 9:53 PM

## 2018-04-24 ENCOUNTER — Encounter (HOSPITAL_COMMUNITY): Payer: Self-pay

## 2018-04-24 LAB — BASIC METABOLIC PANEL
Anion gap: 4 — ABNORMAL LOW (ref 5–15)
BUN: 29 mg/dL — ABNORMAL HIGH (ref 8–23)
CO2: 34 mmol/L — ABNORMAL HIGH (ref 22–32)
Calcium: 8.1 mg/dL — ABNORMAL LOW (ref 8.9–10.3)
Chloride: 105 mmol/L (ref 98–111)
Creatinine, Ser: 1.38 mg/dL — ABNORMAL HIGH (ref 0.61–1.24)
GFR calc Af Amer: 58 mL/min — ABNORMAL LOW (ref >60)
GFR calc non Af Amer: 50 mL/min — ABNORMAL LOW (ref >60)
Glucose, Bld: 115 mg/dL — ABNORMAL HIGH (ref 70–99)
Potassium: 3.6 mmol/L (ref 3.5–5.1)
Sodium: 143 mmol/L (ref 135–145)

## 2018-04-24 LAB — CBC
HCT: 29.1 % — ABNORMAL LOW (ref 39.0–52.0)
Hemoglobin: 8.7 g/dL — ABNORMAL LOW (ref 13.0–17.0)
MCH: 28.9 pg (ref 26.0–34.0)
MCHC: 29.9 g/dL — ABNORMAL LOW (ref 30.0–36.0)
MCV: 96.7 fL (ref 78.0–100.0)
Platelets: 221 10*3/uL (ref 150–400)
RBC: 3.01 MIL/uL — ABNORMAL LOW (ref 4.22–5.81)
RDW: 15.1 % (ref 11.5–15.5)
WBC: 8 10*3/uL (ref 4.0–10.5)

## 2018-04-24 LAB — PROTIME-INR
INR: 1.39
Prothrombin Time: 16.9 seconds — ABNORMAL HIGH (ref 11.4–15.2)

## 2018-04-24 MED ORDER — WARFARIN SODIUM 7.5 MG PO TABS
7.5000 mg | ORAL_TABLET | Freq: Once | ORAL | Status: AC
  Administered 2018-04-24: 7.5 mg via ORAL
  Filled 2018-04-24: qty 1

## 2018-04-24 MED ORDER — FUROSEMIDE 40 MG PO TABS
40.0000 mg | ORAL_TABLET | Freq: Every day | ORAL | Status: DC
  Administered 2018-04-24 – 2018-04-27 (×4): 40 mg via ORAL
  Filled 2018-04-24 (×4): qty 1

## 2018-04-24 NOTE — Progress Notes (Addendum)
[facility] for warfarin Indication: atrial fibrillation  Allergies  Allergen Reactions  . Bee Venom Swelling    Patient Measurements: Height: 5\' 8"  (172.7 cm) Weight: (!) 360 lb 10.8 oz (163.6 kg) IBW/kg (Calculated) : 68.4 Heparin Dosing Weight: 110 kg  Vital Signs: Temp: 98.2 F (36.8 C) (08/24 0800) Temp Source: Oral (08/24 0800) BP: 99/67 (08/24 0400) Pulse Rate: 99 (08/24 0500)  Labs: Recent Labs    04/22/18 0407 04/23/18 0512 04/24/18 0554  HGB 9.5* 9.2* 8.7*  HCT 32.3* 30.7* 29.1*  PLT 240 195 221  LABPROT 15.7* 16.6* 16.9*  INR 1.26 1.36 1.39  HEPARINUNFRC 0.65 <0.10*  --   CREATININE 1.02 1.21 1.38*    Estimated Creatinine Clearance: 74 mL/min (A) (by C-G formula based on SCr of 1.38 mg/dL (H)).   Assessment: Pharmacy consulted to dose warfarin for patient with atrial fibrillation. Cardiology does not recommend NOAC's due to weight. INR is below desired goal, but trending upward.   INR: 1.38 today ( Coumadin does not given 8/22)   Goal of Therapy:  INR 2-3 Monitor platelets by anticoagulation protocol: Yes   Plan:  Warfarin 7.5 mg x 1 dose. Check INR daily Continue to monitor H&H and platelets  Isac Sarna, BS [place], BCPS Clinical Pharmacist Pager [phone] 04/24/2018 10:00 AM

## 2018-04-24 NOTE — Progress Notes (Signed)
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  Wound culture grew E. coli and Enterobacter aerogenous for which she was discharged home on IV Rocephin for 6 weeks duration (stop date 04/28/2018). Patient presented to the ED with shortness of breath and weakness for past 3 days.  Other symptoms include visual hallucinations, poor p.o. intake and decreased mobility. In the ED he was found to be in rapid A. fib, tachypneic and an acute kidney injury. Chest x-ray showed multilobar pneumonia. Admitted to stepdown unit for Sepsis with Healthcare associated pneumonia and new onset A. fib with RVR.  Assessment & Plan: 1-Atrial fibrillation with RVR ([place]) -new diagnosis -most likely triggered by sepsis -CHADsVASC  Score 2-3 -will continue adjusted dose of b-blocker and the need of oral cardizem as per cardiology rec's -continue building up coumadin for secondary prevention  2-Hypertension -BP stable -follow VS  3-sepsis due to HCAP -treated with broad spectrum antibiotics, and them levaquin. -patient is afebrile and no complaining of SOB. -will d/c levaquin; patient is receiving extended treatment for wound infection after hip surgery with rocephin. -continue to use flutter valve/IS -continue to wean down oxygen  4-left hip postoperative infection/prothetic joint infection -s/p I and D on 7/17 -cx's positive for E. Coli and enterobacter -continue IV rocephin  -last date of antibiotics 8/28 -outpatient follow up with orthopedic service -left hip still  with mild serosanguineous suppurative wound  5-OSA and obesity hypoventilation syndrome  -continue CPAP QHS  6-morbid obesity -Body mass index is 54.84 kg/m. -low calorie diet and portion control discussed with patient  7-AKI -in the setting of poor hydration and oral intake -improved/resolved with IVF's -follow renal function trend intermittently, especially now that he will need lasix.  8-anemia of chronic disease -Hgb baseline 8.5-9.5 -will follow Hgb trend, especially with active anticoagulation transition . -no signs of overt bleeding   9-temporal arteritis -continue steroids tapering   10-physical deconditioning  -per PT will to return back to SNF at discharge for further rehabilitation.  11-acute on chronic diastolic HF -received one dose of lasix on 8/23 -CXR demonstrating vascular congestion and mild interstitial edema. -follow daily weights and strict I's and O's -creatinine has trended up with IV lasix. Will start on oral lasix  12-hospital acquired delirium  -improved after ativan and haldol -oriented X2 today -continue constant reorientation -follow mental status.  -appears to be slowly improving   DVT prophylaxis: heparin [place] and transitioning to coumadin. Code Status: Full Family Communication: no family present   Disposition Plan: can consider transfer to telemetry later today if heart rate improves. Will need SNF placement  Consultants:   Cardiology   Procedures:   See below for x-ray reports   Antimicrobials:  Anti-infectives (From admission, onward)   Start     Dose/Rate Route Frequency Ordered Stop   04/19/18 1300  cefTRIAXone (ROCEPHIN) 2 g in sodium chloride 0.9 % 100 mL IVPB     2 g 200 mL/hr over 30 Minutes Intravenous Every 24 hours 04/19/18 1130     04/19/18 1200  levofloxacin (LEVAQUIN) IVPB 750 mg  Status:  Discontinued  750 mg 100 mL/hr over 90 Minutes Intravenous Every 24 hours 04/19/18 1033 04/21/18 1201   04/17/18 1400   vancomycin (VANCOCIN) 1,750 mg in sodium chloride 0.9 % 500 mL IVPB  Status:  Discontinued     1,750 mg 250 mL/hr over 120 Minutes Intravenous Every 24 hours 04/16/18 1703 04/19/18 1029   04/17/18 1300  ceFEPIme (MAXIPIME) 2 g in sodium chloride 0.9 % 100 mL IVPB  Status:  Discontinued     2 g 200 mL/hr over 30 Minutes Intravenous Every 24 hours 04/16/18 1703 04/17/18 1257   04/17/18 1300  ceFEPIme (MAXIPIME) 2 g in sodium chloride 0.9 % 100 mL IVPB  Status:  Discontinued     2 g 200 mL/hr over 30 Minutes Intravenous Every 12 hours 04/17/18 1257 04/19/18 1029   04/16/18 1400  vancomycin (VANCOCIN) 2,000 mg in sodium chloride 0.9 % 500 mL IVPB     2,000 mg 250 mL/hr over 120 Minutes Intravenous  Once 04/16/18 1315 04/16/18 1657   04/16/18 1315  vancomycin (VANCOCIN) IVPB 1000 mg/200 mL premix  Status:  Discontinued     1,000 mg 200 mL/hr over 60 Minutes Intravenous  Once 04/16/18 1304 04/16/18 1314   04/16/18 1315  ceFEPIme (MAXIPIME) 2 g in sodium chloride 0.9 % 100 mL IVPB     2 g 200 mL/hr over 30 Minutes Intravenous  Once 04/16/18 1304 04/16/18 1419       Subjective: Still has some cough. He is concerned that he cannot tell me the date and reports that he has been having issues with his memory for the last few months. Denies any worsening shortness of breath or chest pain. Patient is seen while eating breakfast and seems to be having frequent coughing spells.  Objective: Vitals:   04/24/18 0308 04/24/18 0400 04/24/18 0500 04/24/18 0800  BP:  99/67    Pulse: (!) 104 97 99   Resp: 10 (!) 8 11   Temp:  98.6 F (37 C)  98.2 F (36.8 C)  TempSrc:  Oral  Oral  SpO2: 95% 97% 97%   Weight:  (!) 163.6 kg    Height:        Intake/Output Summary (Last 24 hours) at 04/24/2018 0941 Last data filed at 04/23/2018 2000 Gross per 24 hour  Intake 360 ml  Output 550 ml  Net -190 ml   Filed Weights   04/22/18 0604 04/23/18 0613 04/24/18 0400  Weight: (!) 165.2 kg (!) 160.2 kg (!) 163.6  kg    Examination: General exam: Alert, awake, oriented x 2 (place and person) Respiratory system: Clear to auscultation. Respiratory effort normal. Cardiovascular system:irregular. No murmurs, rubs, gallops. Gastrointestinal system: Abdomen is nondistended, soft and nontender. No organomegaly or masses felt. Normal bowel sounds heard. Central nervous system:  No focal neurological deficits. Extremities: 1+ edema bilaterally Skin: venous stasis changes to lower legs bilaterally Psychiatry: disoriented to time, but is aware he is in the hospital and why he is in the hospital    Data Reviewed: I have personally reviewed following labs and imaging studies  CBC: Recent Labs  Lab 04/20/18 0424 04/21/18 0424 04/22/18 0407 04/23/18 0512 04/24/18 0554  WBC 7.8 8.9 9.6 9.8 8.0  HGB 9.1* 8.6* 9.5* 9.2* 8.7*  HCT 30.9* 29.0* 32.3* 30.7* 29.1*  MCV 98.4 97.6 97.0 95.3 96.7  PLT 194 185 240 195 062   Basic Metabolic Panel: Recent Labs  Lab 04/19/18 0351 04/21/18 0424 04/22/18 0407 04/23/18 0512 04/24/18 0554  NA 144 141  142 143 143  K 4.1 4.4 4.1 3.8 3.6  CL 110 107 104 105 105  CO2 27 29 29 29  34*  GLUCOSE 124* 114* 97 106* 115*  BUN 19 17 16 21  29*  CREATININE 1.27* 1.09 1.02 1.21 1.38*  CALCIUM 8.3* 8.3* 8.7* 8.4* 8.1*  MG  --   --  1.8  --   --    GFR: Estimated Creatinine Clearance: 74 mL/min (A) (by C-G formula based on SCr of 1.38 mg/dL (H)).  Coagulation Profile: Recent Labs  Lab 04/21/18 0904 04/22/18 0407 04/23/18 0512 04/24/18 0554  INR 1.26 1.26 1.36 1.39   Urine analysis:    Component Value Date/Time   COLORURINE YELLOW 04/16/2018 1440   APPEARANCEUR HAZY (A) 04/16/2018 1440   APPEARANCEUR Clear 01/18/2018 1539   LABSPEC 1.021 04/16/2018 1440   PHURINE 5.0 04/16/2018 1440   GLUCOSEU NEGATIVE 04/16/2018 1440   HGBUR NEGATIVE 04/16/2018 1440   BILIRUBINUR NEGATIVE 04/16/2018 1440   BILIRUBINUR Negative 01/18/2018 1539   KETONESUR NEGATIVE  04/16/2018 1440   PROTEINUR 30 (A) 04/16/2018 1440   UROBILINOGEN 0.2 09/06/2012 0213   NITRITE NEGATIVE 04/16/2018 1440   LEUKOCYTESUR MODERATE (A) 04/16/2018 1440   LEUKOCYTESUR Negative 01/18/2018 1539    Recent Results (from the past 240 hour(s))  Blood Culture (routine x 2)     Status: None   Collection Time: 04/16/18  1:23 PM  Result Value Ref Range Status   Specimen Description BLOOD RIGHT FOREARM  Final   Special Requests   Final    BOTTLES DRAWN AEROBIC AND ANAEROBIC Blood Culture adequate volume   Culture   Final    NO GROWTH 5 DAYS Performed at [facility], [address] [place], [place] [address]    Report Status 04/21/2018 FINAL  Final  Blood Culture (routine x 2)     Status: None   Collection Time: 04/16/18  1:29 PM  Result Value Ref Range Status   Specimen Description BLOOD RIGHT WRIST  Final   Special Requests   Final    BOTTLES DRAWN AEROBIC AND ANAEROBIC Blood Culture adequate volume   Culture   Final    NO GROWTH 5 DAYS Performed at [facility], [address] [place], [place] [address]    Report Status 04/21/2018 FINAL  Final  Urine culture     Status: Abnormal   Collection Time: 04/16/18  2:40 PM  Result Value Ref Range Status   Specimen Description   Final    URINE, CLEAN CATCH Performed at [facility], [address] [place], [place] [address]    Special Requests   Final    NONE Performed at [facility], [address] [place], [place] [address]    Culture MULTIPLE SPECIES PRESENT, SUGGEST RECOLLECTION (A)  Final   Report Status 04/17/2018 FINAL  Final  MRSA PCR Screening     Status: None   Collection Time: 04/16/18  5:30 PM  Result Value Ref Range Status   MRSA by PCR NEGATIVE NEGATIVE Final    Comment:        The GeneXpert MRSA Assay (FDA approved for NASAL specimens only), is one component of a comprehensive MRSA colonization surveillance program. It is not intended to diagnose MRSA infection nor to guide or monitor treatment  for MRSA infections. Performed at [facility], [address] [place], [place] [address]      Radiology Studies: Dg Chest [facility] 1 View  Result Date: 04/23/2018 CLINICAL DATA:  Dyspnea, hypertension, ex smoker.  EXAM: PORTABLE CHEST 1 VIEW COMPARISON:  04/16/2018 FINDINGS: Stable cardiomegaly with tortuous atherosclerotic aorta. Pulmonary vascular redistribution is identified. Minimal clearing of right upper lobe airspace disease. Persistent left mid and bibasilar airspace disease, atelectasis and/or scarring slightly increased at the right lung base. No acute osseous abnormality. IMPRESSION: 1. Cardiomegaly with aortic atherosclerosis. 2. Central vascular congestion consistent with mild CHF. 3. Pulmonary opacities in the right upper lobe have partially cleared. Persistent bibasilar and left mid lung airspace opacities, atelectasis and/or scarring persist, slightly increased at the right lung base. Electronically Signed   By: Ashley Royalty M.D.   On: 04/23/2018 13:18      Scheduled Meds: . busPIRone  5 mg Oral TID  . cholecalciferol  2,000 Units Oral QPM  . cycloSPORINE  1 drop Both Eyes BID  . darifenacin  15 mg Oral Daily  . diltiazem  60 mg Oral TID  . docusate sodium  100 mg Oral BID  . famotidine  20 mg Oral Daily  . gabapentin  300 mg Oral Daily  . heparin  5,000 Units Subcutaneous Q8H  . mouth rinse  15 mL Mouth Rinse BID  . methadone  10 mg Oral QID  . metoprolol succinate  100 mg Oral BID  . nystatin  1 g Topical Daily  . pantoprazole  40 mg Oral Daily  . polyethylene glycol  17 g Oral BID  . predniSONE  7.5 mg Oral BID WC  . venlafaxine XR  300 mg Oral Q breakfast  . warfarin  7.5 mg Oral Once  . Warfarin - Pharmacist Dosing Inpatient   Does not apply q1800   Continuous Infusions: . cefTRIAXone (ROCEPHIN)  IV Stopped (04/23/18 1441)     LOS: 8 days    Time spent: 35 minutes. Greater than 50% of this time was spent in direct contact with the patient, coordinating care  and discussing relevant ongoing clinical issues, including managing elevated heart rate, weaning down his oxygen requirement, discussing his ongoing issues with memory loss    Kathie Dike, MD [facility] Pager [phone]  If 7PM-7AM, please contact night-coverage [email or web address] Password [facility] 04/24/2018, 9:41 AM

## 2018-04-25 LAB — PROTIME-INR
INR: 1.35
Prothrombin Time: 16.6 seconds — ABNORMAL HIGH (ref 11.4–15.2)

## 2018-04-25 LAB — CBC
HCT: 33.9 % — ABNORMAL LOW (ref 39.0–52.0)
Hemoglobin: 9.9 g/dL — ABNORMAL LOW (ref 13.0–17.0)
MCH: 28.5 pg (ref 26.0–34.0)
MCHC: 29.2 g/dL — ABNORMAL LOW (ref 30.0–36.0)
MCV: 97.7 fL (ref 78.0–100.0)
Platelets: 257 10*3/uL (ref 150–400)
RBC: 3.47 MIL/uL — ABNORMAL LOW (ref 4.22–5.81)
RDW: 14.9 % (ref 11.5–15.5)
WBC: 10.3 10*3/uL (ref 4.0–10.5)

## 2018-04-25 LAB — BASIC METABOLIC PANEL
Anion gap: 8 (ref 5–15)
BUN: 25 mg/dL — ABNORMAL HIGH (ref 8–23)
CO2: 32 mmol/L (ref 22–32)
Calcium: 8.4 mg/dL — ABNORMAL LOW (ref 8.9–10.3)
Chloride: 103 mmol/L (ref 98–111)
Creatinine, Ser: 1.14 mg/dL (ref 0.61–1.24)
GFR calc Af Amer: >60 mL/min (ref >60)
GFR calc non Af Amer: >60 mL/min (ref >60)
Glucose, Bld: 114 mg/dL — ABNORMAL HIGH (ref 70–99)
Potassium: 3.5 mmol/L (ref 3.5–5.1)
Sodium: 143 mmol/L (ref 135–145)

## 2018-04-25 MED ORDER — WARFARIN SODIUM 5 MG PO TABS
10.0000 mg | ORAL_TABLET | Freq: Once | ORAL | Status: AC
  Administered 2018-04-25: 10 mg via ORAL
  Filled 2018-04-25: qty 2

## 2018-04-25 NOTE — Progress Notes (Signed)
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  Wound culture grew E. coli and Enterobacter aerogenous for which she was discharged home on IV Rocephin for 6 weeks duration (stop date 04/28/2018). Patient presented to the ED with shortness of breath and weakness for past 3 days.  Other symptoms include visual hallucinations, poor p.o. intake and decreased mobility. In the ED he was found to be in rapid A. fib, tachypneic and an acute kidney injury. Chest x-ray showed multilobar pneumonia. Admitted to stepdown unit for Sepsis with Healthcare associated pneumonia and new onset A. fib with RVR.  Assessment & Plan: 1-Atrial fibrillation with RVR ([place]) -new diagnosis -most likely triggered by sepsis -CHADsVASC  Score 2-3 -will continue adjusted dose of b-blocker and the need of oral cardizem as per cardiology rec's -continue building up coumadin for secondary prevention  2-Hypertension -BP stable -follow VS  3-sepsis due to HCAP -treated with broad spectrum antibiotics, and them levaquin. -patient is afebrile and no complaining of SOB. -will d/c levaquin; patient is receiving extended treatment for wound infection after hip surgery with rocephin. -continue to use flutter valve/IS -continue to wean down oxygen  4-left hip postoperative infection/prothetic joint infection -s/p I and D on 7/17 -cx's positive for E. Coli and enterobacter -continue IV rocephin  -last date of antibiotics 8/28 -outpatient follow up with orthopedic service -left hip still  with mild serosanguineous suppurative wound  5-OSA and obesity hypoventilation syndrome  -continue CPAP QHS  6-morbid obesity -Body mass index is 54.84 kg/m. -low calorie diet and portion control discussed with patient  7-AKI -in the setting of poor hydration/poor oral intake and then diuresis -improved/resolved with IVF's -follow renal function trend intermittently, especially now that he will need lasix.  8-anemia of chronic disease -Hgb baseline 8.5-9.5 -will follow Hgb trend, especially with active anticoagulation transition . -no signs of overt bleeding   9-temporal arteritis -continue steroids tapering   10-physical deconditioning  -per PT will to return back to SNF at discharge for further rehabilitation.  11-acute on chronic diastolic HF -received one dose of lasix on 8/22 and 8/23 -CXR demonstrating vascular congestion and mild interstitial edema. -follow daily weights and strict I's and O's -creatinine trended up on 8/24 and lasix has been transitioned to PO -so far well tolerated. -follow renal function closely.  12-hospital acquired delirium  -improved after ativan and haldol -oriented X2 today -continue constant reorientation -follow mental status.  -appears to be slowly improving   DVT prophylaxis: heparin [place] and transitioning to coumadin. Code Status: Full Family Communication: no family present   Disposition Plan: Continue antibiotics until 828 as previously prescribed; continue flutter valve and incentive for spirometer, continue CPAP nightly; continue beta-blocker and oral Cardizem.  Continue building up Coumadin.  Patient will need nursing home at discharge.  Consultants:   Cardiology   Procedures:   See below for x-ray reports   Antimicrobials:  Anti-infectives (From admission, onward)   Start     Dose/Rate Route Frequency Ordered Stop   04/19/18 1300  cefTRIAXone (ROCEPHIN) 2 g in sodium chloride 0.9 % 100 mL IVPB     2  g 200 mL/hr over  30 Minutes Intravenous Every 24 hours 04/19/18 1130     04/19/18 1200  levofloxacin (LEVAQUIN) IVPB 750 mg  Status:  Discontinued     750 mg 100 mL/hr over 90 Minutes Intravenous Every 24 hours 04/19/18 1033 04/21/18 1201   04/17/18 1400  vancomycin (VANCOCIN) 1,750 mg in sodium chloride 0.9 % 500 mL IVPB  Status:  Discontinued     1,750 mg 250 mL/hr over 120 Minutes Intravenous Every 24 hours 04/16/18 1703 04/19/18 1029   04/17/18 1300  ceFEPIme (MAXIPIME) 2 g in sodium chloride 0.9 % 100 mL IVPB  Status:  Discontinued     2 g 200 mL/hr over 30 Minutes Intravenous Every 24 hours 04/16/18 1703 04/17/18 1257   04/17/18 1300  ceFEPIme (MAXIPIME) 2 g in sodium chloride 0.9 % 100 mL IVPB  Status:  Discontinued     2 g 200 mL/hr over 30 Minutes Intravenous Every 12 hours 04/17/18 1257 04/19/18 1029   04/16/18 1400  vancomycin (VANCOCIN) 2,000 mg in sodium chloride 0.9 % 500 mL IVPB     2,000 mg 250 mL/hr over 120 Minutes Intravenous  Once 04/16/18 1315 04/16/18 1657   04/16/18 1315  vancomycin (VANCOCIN) IVPB 1000 mg/200 mL premix  Status:  Discontinued     1,000 mg 200 mL/hr over 60 Minutes Intravenous  Once 04/16/18 1304 04/16/18 1314   04/16/18 1315  ceFEPIme (MAXIPIME) 2 g in sodium chloride 0.9 % 100 mL IVPB     2 g 200 mL/hr over 30 Minutes Intravenous  Once 04/16/18 1304 04/16/18 1419       Subjective: Intermittent cough reported.  Denies chest pain, breathing is close to baseline according to patient.  No nausea, no vomiting.  Denies any chest pain or palpitation at this time.  She reports feeling weak and is in agreement to go to nursing home for rehabilitation.  Objective: Vitals:   04/24/18 1840 04/24/18 2118 04/24/18 2122 04/25/18 0600  BP: 104/77  106/77 140/79  Pulse: 98 89 91 89  Resp:  16 20 20   Temp: 98.4 F (36.9 C)  98.7 F (37.1 C) 97.9 F (36.6 C)  TempSrc: Oral  Oral Oral  SpO2: 95% 98% 99% 95%  Weight:      Height:        Intake/Output Summary (Last 24  hours) at 04/25/2018 1535 Last data filed at 04/25/2018 1420 Gross per 24 hour  Intake 960 ml  Output 4450 ml  Net -3490 ml   Filed Weights   04/22/18 0604 04/23/18 0613 04/24/18 0400  Weight: (!) 165.2 kg (!) 160.2 kg (!) 163.6 kg    Examination: General exam: Alert, awake, oriented x 2; (missing time).  No chest pain, reports breathing close to baseline, no nausea, no vomiting, no abdominal pain. Respiratory system: Clear to auscultation. Respiratory effort normal. Cardiovascular system: Irregular, no rubs, no gallops, unable to properly assess JVD due to body habitus.  No appreciated murmurs on exam.  Gastrointestinal system: Abdomen is obese, nondistended, soft and nontender. No organomegaly or masses felt. Normal bowel sounds heard. Central nervous system: Following commands properly; no focal neurological deficits. Extremities: Edema bilaterally, no cyanosis, left hip wound from recent hip surgery with mild serosanguineous drainage.  No erythema. Skin: Rashes, no petechiae, chronic venous stasis changes on his lower extremities bilaterally.  Bruises appreciated on his upper extremities. Psychiatry: Disoriented to time, otherwise with good orientation; no further episode of agitation.  Mood is a stable.  Data Reviewed:  I have personally reviewed following labs and imaging studies  CBC: Recent Labs  Lab 04/21/18 0424 04/22/18 0407 04/23/18 0512 04/24/18 0554 04/25/18 0708  WBC 8.9 9.6 9.8 8.0 10.3  HGB 8.6* 9.5* 9.2* 8.7* 9.9*  HCT 29.0* 32.3* 30.7* 29.1* 33.9*  MCV 97.6 97.0 95.3 96.7 97.7  PLT 185 240 195 221 248   Basic Metabolic Panel: Recent Labs  Lab 04/21/18 0424 04/22/18 0407 04/23/18 0512 04/24/18 0554 04/25/18 0708  NA 141 142 143 143 143  K 4.4 4.1 3.8 3.6 3.5  CL 107 104 105 105 103  CO2 29 29 29  34* 32  GLUCOSE 114* 97 106* 115* 114*  BUN 17 16 21  29* 25*  CREATININE 1.09 1.02 1.21 1.38* 1.14  CALCIUM 8.3* 8.7* 8.4* 8.1* 8.4*  MG  --  1.8  --   --    --    GFR: Estimated Creatinine Clearance: 89.5 mL/min (by C-G formula based on SCr of 1.14 mg/dL).  Coagulation Profile: Recent Labs  Lab 04/21/18 0904 04/22/18 0407 04/23/18 0512 04/24/18 0554 04/25/18 0708  INR 1.26 1.26 1.36 1.39 1.35   Urine analysis:    Component Value Date/Time   COLORURINE YELLOW 04/16/2018 1440   APPEARANCEUR HAZY (A) 04/16/2018 1440   APPEARANCEUR Clear 01/18/2018 1539   LABSPEC 1.021 04/16/2018 1440   PHURINE 5.0 04/16/2018 1440   GLUCOSEU NEGATIVE 04/16/2018 1440   HGBUR NEGATIVE 04/16/2018 1440   BILIRUBINUR NEGATIVE 04/16/2018 1440   BILIRUBINUR Negative 01/18/2018 1539   KETONESUR NEGATIVE 04/16/2018 1440   PROTEINUR 30 (A) 04/16/2018 1440   UROBILINOGEN 0.2 09/06/2012 0213   NITRITE NEGATIVE 04/16/2018 1440   LEUKOCYTESUR MODERATE (A) 04/16/2018 1440   LEUKOCYTESUR Negative 01/18/2018 1539    Recent Results (from the past 240 hour(s))  Blood Culture (routine x 2)     Status: None   Collection Time: 04/16/18  1:23 PM  Result Value Ref Range Status   Specimen Description BLOOD RIGHT FOREARM  Final   Special Requests   Final    BOTTLES DRAWN AEROBIC AND ANAEROBIC Blood Culture adequate volume   Culture   Final    NO GROWTH 5 DAYS Performed at [facility], [address], [place], [place] [address]    Report Status 04/21/2018 FINAL  Final  Blood Culture (routine x 2)     Status: None   Collection Time: 04/16/18  1:29 PM  Result Value Ref Range Status   Specimen Description BLOOD RIGHT WRIST  Final   Special Requests   Final    BOTTLES DRAWN AEROBIC AND ANAEROBIC Blood Culture adequate volume   Culture   Final    NO GROWTH 5 DAYS Performed at [facility], [address], [place], [place] [address]    Report Status 04/21/2018 FINAL  Final  Urine culture     Status: Abnormal   Collection Time: 04/16/18  2:40 PM  Result Value Ref Range Status   Specimen Description   Final    URINE, CLEAN CATCH Performed at [facility],  [address], [place], [place] [address]    Special Requests   Final    NONE Performed at [facility], [address], [place], [place] [address]    Culture MULTIPLE SPECIES PRESENT, SUGGEST RECOLLECTION (A)  Final   Report Status 04/17/2018 FINAL  Final  MRSA PCR Screening     Status: None   Collection Time: 04/16/18  5:30 PM  Result Value Ref Range Status   MRSA by PCR NEGATIVE  NEGATIVE Final    Comment:        The GeneXpert MRSA Assay (FDA approved for NASAL specimens only), is one component of a comprehensive MRSA colonization surveillance program. It is not intended to diagnose MRSA infection nor to guide or monitor treatment for MRSA infections. Performed at [facility], [address], [place], [place] [address]      Radiology Studies: No results found.   Scheduled Meds: . busPIRone  5 mg Oral TID  . cholecalciferol  2,000 Units Oral QPM  . cycloSPORINE  1 drop Both Eyes BID  . darifenacin  15 mg Oral Daily  . diltiazem  60 mg Oral TID  . docusate sodium  100 mg Oral BID  . famotidine  20 mg Oral Daily  . furosemide  40 mg Oral Daily  . gabapentin  300 mg Oral Daily  . heparin  5,000 Units Subcutaneous Q8H  . mouth rinse  15 mL Mouth Rinse BID  . methadone  10 mg Oral QID  . metoprolol succinate  100 mg Oral BID  . nystatin  1 g Topical Daily  . pantoprazole  40 mg Oral Daily  . polyethylene glycol  17 g Oral BID  . predniSONE  7.5 mg Oral BID WC  . venlafaxine XR  300 mg Oral Q breakfast  . warfarin  10 mg Oral Once  . warfarin  7.5 mg Oral Once  . Warfarin - Pharmacist Dosing Inpatient   Does not apply q1800   Continuous Infusions: . cefTRIAXone (ROCEPHIN)  IV Stopped (04/24/18 1414)     LOS: 9 days    Time spent: 30 minutes    Barton Dubois, MD [facility] Pager [phone]  If 7PM-7AM, please contact night-coverage [email or web address] Password [facility] 04/25/2018, 3:35 PM

## 2018-04-25 NOTE — Progress Notes (Signed)
[place] for warfarin Indication: atrial fibrillation  Allergies  Allergen Reactions  . Bee Venom Swelling    Patient Measurements: Height: 5\' 8"  (172.7 cm) Weight: (!) 360 lb 10.8 oz (163.6 kg) IBW/kg (Calculated) : 68.4 Heparin Dosing Weight: 110 kg  Vital Signs: Temp: 97.9 F (36.6 C) (08/25 0600) Temp Source: Oral (08/25 0600) BP: 140/79 (08/25 0600) Pulse Rate: 89 (08/25 0600)  Labs: Recent Labs    04/23/18 0512 04/24/18 0554 04/25/18 0708  HGB 9.2* 8.7* 9.9*  HCT 30.7* 29.1* 33.9*  PLT 195 221 257  LABPROT 16.6* 16.9* 16.6*  INR 1.36 1.39 1.35  HEPARINUNFRC <0.10*  --   --   CREATININE 1.21 1.38* 1.14    Estimated Creatinine Clearance: 89.5 mL/min (by C-G formula based on SCr of 1.14 mg/dL).   Assessment: Pharmacy consulted to dose warfarin for patient with atrial fibrillation. Cardiology does not recommend NOAC's due to weight. INR is below desired goal, but trending upward.   INR: 1.35 today ( Coumadin does not given 8/22). INR without much change, will give higher dose.   Goal of Therapy:  INR 2-3 Monitor platelets by anticoagulation protocol: Yes   Plan:  Warfarin 10mg  x 1 dose. Check INR daily Continue to monitor H&H and platelets  Isac Sarna, BS [place], [place] Clinical Pharmacist Pager [phone] 04/25/2018 8:40 AM

## 2018-04-26 DIAGNOSIS — I5033 Acute on chronic diastolic (congestive) heart failure: Secondary | ICD-10-CM

## 2018-04-26 LAB — PROTIME-INR
INR: 1.45
Prothrombin Time: 17.5 seconds — ABNORMAL HIGH (ref 11.4–15.2)

## 2018-04-26 MED ORDER — DILTIAZEM HCL ER COATED BEADS 180 MG PO CP24
180.0000 mg | ORAL_CAPSULE | Freq: Every day | ORAL | Status: DC
  Administered 2018-04-27: 180 mg via ORAL
  Filled 2018-04-26: qty 1

## 2018-04-26 MED ORDER — WARFARIN SODIUM 7.5 MG PO TABS
7.5000 mg | ORAL_TABLET | Freq: Once | ORAL | Status: AC
  Administered 2018-04-26: 7.5 mg via ORAL
  Filled 2018-04-26: qty 1

## 2018-04-26 NOTE — NC FL2 (Signed)
[place] LEVEL OF CARE SCREENING TOOL     IDENTIFICATION  Patient Name: [name] Birthdate: 22-Aug-1946 Sex: male Admission Date (Current Location): 04/16/2018  [facility] and [place] Number:  [employer] and Address:  [place] [address], [place]      Provider Number: [id number]  Attending Physician Name and Address:  Barton Dubois, MD  Relative Name and Phone Number:       Current Level of Care: Hospital Recommended Level of Care: [place] Prior Approval Number:    Date Approved/Denied:   PASRR Number: [id number])  Discharge Plan: SNF    Current Diagnoses: Patient Active Problem List   Diagnosis Date Noted  . HCAP (healthcare-associated pneumonia) 04/16/2018  . Atrial fibrillation with RVR ([place]) 04/16/2018  . Morbid obesity with BMI of 50.0-59.9, adult ([place]) 04/16/2018  . AKI (acute kidney injury) ([place]) 04/16/2018  . Visual hallucination 04/16/2018  . SIRS (systemic inflammatory response syndrome) ([place]) 04/16/2018  . Anemia of chronic disease 04/16/2018  . Left hip postoperative wound infection 03/15/2018  . 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 Femur fracture, left ([place]) 02/15/2018  . Anemia 02/15/2018  . Femur fracture ([place]) 02/15/2018  . 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 SOB (shortness of breath) 03/31/2014  . Hypertension   . Hyponatremia 03/18/2012  . 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[place]) 03/17/2012    Orientation RESPIRATION BLADDER Height & Weight     Self, Time, Situation, Place  Normal Continent Weight: (!) 360 lb 10.8 oz (163.6 kg) Height:  5\' 8"  (172.7 cm)  BEHAVIORAL SYMPTOMS/MOOD NEUROLOGICAL BOWEL NUTRITION STATUS      Continent Diet(Heart healthy)  AMBULATORY STATUS COMMUNICATION OF NEEDS Skin   Extensive Assist Verbally Surgical wounds(left hip lateral)                       Personal Care  Assistance Level of Assistance  Bathing, Feeding, Dressing Bathing Assistance: Limited assistance Feeding assistance: Independent Dressing Assistance: Limited assistance     Functional Limitations Info  Sight, Hearing, Speech Sight Info: Adequate Hearing Info: Adequate Speech Info: Adequate    SPECIAL CARE FACTORS FREQUENCY  PT (By licensed PT)     PT Frequency: 5x/week              Contractures Contractures Info: Not present    Additional Factors Info  Code Status, Allergies, Psychotropic Code Status Info: Full Code Allergies Info: Bee Venom  Psychotropic Info: Effexor         Current Medications (04/26/2018):  This is the current hospital active medication list Current Facility-Administered Medications  Medication Dose Route Frequency Provider Last Rate Last Dose  . acetaminophen (TYLENOL) tablet 650 mg  650 mg Oral Q6H PRN Dhungel, Nishant, MD   650 mg at 04/19/18 1572   Or  . acetaminophen (TYLENOL) suppository 650 mg  650 mg Rectal Q6H PRN Dhungel, Nishant, MD      . bisacodyl (DULCOLAX) suppository 10 mg  10 mg Rectal Daily PRN Dhungel, Nishant, MD      . busPIRone (BUSPAR) tablet 5 mg  5 mg Oral TID Barton Dubois, MD   5 mg at 04/25/18 2244  . cefTRIAXone (ROCEPHIN) 2 g in sodium chloride 0.9 % 100 mL IVPB  2 g Intravenous Q24H Dhungel, Nishant, MD   Stopped at 04/25/18 1610  . cholecalciferol (VITAMIN D) tablet 2,000 Units  2,000 Units Oral QPM Dhungel, Nishant,  MD   2,000 Units at 04/25/18 1823  . cycloSPORINE (RESTASIS) 0.05 % ophthalmic emulsion 1 drop  1 drop Both Eyes BID Dhungel, Nishant, MD   1 drop at 04/26/18 0930  . darifenacin (ENABLEX) 24 hr tablet 15 mg  15 mg Oral Daily Dhungel, Nishant, MD   15 mg at 04/26/18 0937  . [START ON 04/27/2018] diltiazem (CARDIZEM CD) 24 hr capsule 180 mg  180 mg Oral Daily Satira Sark, MD      . diltiazem (CARDIZEM) tablet 60 mg  60 mg Oral TID Satira Sark, MD   60 mg at 04/26/18 0936  . docusate sodium  (COLACE) capsule 100 mg  100 mg Oral BID Dhungel, Nishant, MD   100 mg at 04/25/18 2244  . famotidine (PEPCID) tablet 20 mg  20 mg Oral Daily Dhungel, Nishant, MD   20 mg at 04/26/18 0937  . furosemide (LASIX) tablet 40 mg  40 mg Oral Daily Kathie Dike, MD   40 mg at 04/26/18 0935  . gabapentin (NEURONTIN) capsule 300 mg  300 mg Oral Daily Dhungel, Nishant, MD   300 mg at 04/26/18 0936  . guaiFENesin-dextromethorphan (ROBITUSSIN DM) 100-10 MG/5ML syrup 5 mL  5 mL Oral Q4H PRN Dhungel, Nishant, MD      . haloperidol lactate (HALDOL) injection 2.5 mg  2.5 mg Intravenous Q6H PRN Schorr, Rhetta Mura, NP   2.5 mg at 04/22/18 2013  . heparin injection 5,000 Units  5,000 Units Subcutaneous Q8H Arnoldo Lenis, MD   5,000 Units at 04/26/18 [phone]  . MEDLINE mouth rinse  15 mL Mouth Rinse BID Dhungel, Nishant, MD   15 mL at 04/25/18 1001  . menthol-cetylpyridinium (CEPACOL) lozenge 3 mg  1 lozenge Oral PRN Dhungel, Nishant, MD      . methadone (DOLOPHINE) tablet 10 mg  10 mg Oral QID Dhungel, Nishant, MD   10 mg at 04/26/18 0937  . metoprolol succinate (TOPROL-XL) 24 hr tablet 100 mg  100 mg Oral BID Arnoldo Lenis, MD   100 mg at 04/26/18 7893  . nystatin (MYCOSTATIN/NYSTOP) topical powder 1 g  1 g Topical Daily Dhungel, Nishant, MD   1 g at 04/26/18 0942  . ondansetron (ZOFRAN) tablet 4 mg  4 mg Oral Q6H PRN Dhungel, Nishant, MD       Or  . ondansetron (ZOFRAN) injection 4 mg  4 mg Intravenous Q6H PRN Dhungel, Nishant, MD   4 mg at 04/20/18 1330  . pantoprazole (PROTONIX) EC tablet 40 mg  40 mg Oral Daily Dhungel, Nishant, MD   40 mg at 04/26/18 0935  . phenol (CHLORASEPTIC) mouth spray 1 spray  1 spray Mouth/Throat PRN Dhungel, Nishant, MD      . polyethylene glycol (MIRALAX / GLYCOLAX) packet 17 g  17 g Oral BID Dhungel, Nishant, MD   17 g at 04/25/18 0957  . predniSONE (DELTASONE) tablet 7.5 mg  7.5 mg Oral BID WC Dhungel, Nishant, MD   7.5 mg at 04/26/18 0934  . venlafaxine XR (EFFEXOR-XR)  24 hr capsule 300 mg  300 mg Oral Q breakfast Dhungel, Nishant, MD   300 mg at 04/26/18 0935  . warfarin (COUMADIN) tablet 7.5 mg  7.5 mg Oral Once Barton Dubois, MD      . Warfarin - Pharmacist Dosing Inpatient   Does not apply [id number] Barton Dubois, MD         Discharge Medications: Please see discharge summary for a list of discharge medications.  Relevant Imaging  Results:  Relevant Lab Results:   Additional Information SS# [id number]. Weight 360 lb. Patient uses CPAP   Brecklyn [place]vis, Nira Conn D, LCSW

## 2018-04-26 NOTE — Progress Notes (Signed)
Patient refused Buspar. Says he does not normally take it. And refused his prednisone.

## 2018-04-26 NOTE — Care Management Important Message (Signed)
Important Message  Patient Details  Name: [name] MRN: [id number] Date of Birth: 04-Mar-1946   Medicare Important Message Given:  Yes    Shelda Altes 04/26/2018, 11:37 AM

## 2018-04-26 NOTE — Progress Notes (Signed)
[place] for warfarin Indication: atrial fibrillation  Allergies  Allergen Reactions  . Bee Venom Swelling    Patient Measurements: Height: 5\' 8"  (172.7 cm) Weight: (!) 360 lb 10.8 oz (163.6 kg) IBW/kg (Calculated) : 68.4 Heparin Dosing Weight: 110 kg  Vital Signs: Temp: 98.8 F (37.1 C) (08/26 0640) Temp Source: Oral (08/26 0640) BP: 149/99 (08/26 0640) Pulse Rate: 83 (08/26 0640)  Labs: Recent Labs    04/24/18 0554 04/25/18 0708 04/26/18 0600  HGB 8.7* 9.9*  --   HCT 29.1* 33.9*  --   PLT 221 257  --   LABPROT 16.9* 16.6* 17.5*  INR 1.39 1.35 1.45  CREATININE 1.38* 1.14  --     Estimated Creatinine Clearance: 89.5 mL/min (by C-G formula based on SCr of 1.14 mg/dL).   Assessment: Pharmacy consulted to dose warfarin for patient with atrial fibrillation. Cardiology does not recommend NOAC's due to weight. INR is slowly moving up and expect to see results of last couple of days to make an impact soon..   INR: is 1.45 today   Goal of Therapy:  INR 2-3 Monitor platelets by anticoagulation protocol: Yes   Plan:  Give warfarin 7.5mg  x 1 dose. Check INR daily Continue to monitor H&H and platelets   Despina Pole, Pharm. D. Clinical Pharmacist 04/26/2018 10:50 AM

## 2018-04-26 NOTE — Progress Notes (Signed)
Pt requested CPAP placement. [facility] CPAP plugged into red outlet with 2L O2 in line

## 2018-04-26 NOTE — Progress Notes (Signed)
PT Cancellation Note  Patient Details Name: [name] MRN: [id number] DOB: May 24, 1946   Cancelled Treatment:    Reason Eval/Treat Not Completed: Other (comment).  Will try again at another time as pt and schedule allow.   Ramond Dial 04/26/2018, 12:43 PM   Mee Hives, PT MS Acute Rehab Dept. Number: [place] and [place]

## 2018-04-26 NOTE — Progress Notes (Signed)
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  Wound culture grew E. coli and Enterobacter aerogenous for which she was discharged home on IV Rocephin for 6 weeks duration (stop date 04/28/2018). Patient presented to the ED with shortness of breath and weakness for past 3 days.  Other symptoms include visual hallucinations, poor p.o. intake and decreased mobility. In the ED he was found to be in rapid A. fib, tachypneic and an acute kidney injury. Chest x-ray showed multilobar pneumonia. Admitted to stepdown unit for Sepsis with Healthcare associated pneumonia and new onset A. fib with RVR.  Assessment & Plan: 1-Atrial fibrillation with RVR ([place]) -new diagnosis -most likely triggered by sepsis -CHADsVASC  Score 2-3 -will continue adjusted dose of b-blocker and the need of oral cardizem as per cardiology rec's -continue building up coumadin for secondary prevention  2-Hypertension -BP stable and well controlled.  -follow VS  3-sepsis due to HCAP -treated with broad spectrum antibiotics, and them levaquin. -patient is afebrile and no complaining of SOB. -will d/c levaquin; patient is receiving extended treatment for wound infection after hip surgery with rocephin. -continue to use flutter valve/IS -continue to wean down oxygen -will need rehabilitation at SNF.  4-left hip postoperative infection/prothetic joint infection -s/p I and D on 7/17 -cx's positive for E. Coli and enterobacter -continue IV rocephin  -last date of antibiotics  8/28 -outpatient follow up with orthopedic service -left hip still with mild serosanguineous suppurative wound  5-OSA and obesity hypoventilation syndrome  -continue CPAP QHS  6-morbid obesity -Body mass index is 54.84 kg/m. -low calorie diet and portion control discussed with patient  7-AKI -in the setting of poor hydration/poor oral intake and then diuresis -improved/resolved with IVF's and adjustment in lasix dose. -follow renal function trend intermittently, especially now that he will need lasix.  8-anemia of chronic disease -Hgb baseline 8.5-9.5 -will follow Hgb trend, especially with active anticoagulation transition . -no signs of overt bleeding   9-temporal arteritis -continue steroids tapering   10-physical deconditioning  -per PT will to return back to SNF at discharge for further rehabilitation.  11-acute on chronic diastolic HF -received one dose of lasix on 8/22 and 8/23 -CXR demonstrating vascular congestion and mild interstitial edema. -follow daily weights and strict I's and O's -creatinine trended up on 8/24 and lasix has been transitioned to PO -so far well tolerated and his renal function is back to normal now.  -follow renal function closely.  12-hospital acquired delirium  -improved after ativan and haldol -oriented X3 today -continue constant reorientation -follow mental status.  -appears to be at baseline now   DVT prophylaxis: heparin [place] and transitioning to coumadin. Code Status: Full Family Communication: no family present   Disposition Plan: Continue antibiotics until 8/28 as previously prescribed; continue flutter valve and incentive for spirometer, continue CPAP nightly; continue beta-blocker and oral Cardizem.  Continue Coumadin per pharmacy.  Patient will need nursing home at discharge.  Consultants:   Cardiology   Procedures:   See below for x-ray reports   Antimicrobials:  Anti-infectives (From admission, onward)   Start      Dose/Rate  Route Frequency Ordered Stop   04/19/18 1300  cefTRIAXone (ROCEPHIN) 2 g in sodium chloride 0.9 % 100 mL IVPB     2 g 200 mL/hr over 30 Minutes Intravenous Every 24 hours 04/19/18 1130     04/19/18 1200  levofloxacin (LEVAQUIN) IVPB 750 mg  Status:  Discontinued     750 mg 100 mL/hr over 90 Minutes Intravenous Every 24 hours 04/19/18 1033 04/21/18 1201   04/17/18 1400  vancomycin (VANCOCIN) 1,750 mg in sodium chloride 0.9 % 500 mL IVPB  Status:  Discontinued     1,750 mg 250 mL/hr over 120 Minutes Intravenous Every 24 hours 04/16/18 1703 04/19/18 1029   04/17/18 1300  ceFEPIme (MAXIPIME) 2 g in sodium chloride 0.9 % 100 mL IVPB  Status:  Discontinued     2 g 200 mL/hr over 30 Minutes Intravenous Every 24 hours 04/16/18 1703 04/17/18 1257   04/17/18 1300  ceFEPIme (MAXIPIME) 2 g in sodium chloride 0.9 % 100 mL IVPB  Status:  Discontinued     2 g 200 mL/hr over 30 Minutes Intravenous Every 12 hours 04/17/18 1257 04/19/18 1029   04/16/18 1400  vancomycin (VANCOCIN) 2,000 mg in sodium chloride 0.9 % 500 mL IVPB     2,000 mg 250 mL/hr over 120 Minutes Intravenous  Once 04/16/18 1315 04/16/18 1657   04/16/18 1315  vancomycin (VANCOCIN) IVPB 1000 mg/200 mL premix  Status:  Discontinued     1,000 mg 200 mL/hr over 60 Minutes Intravenous  Once 04/16/18 1304 04/16/18 1314   04/16/18 1315  ceFEPIme (MAXIPIME) 2 g in sodium chloride 0.9 % 100 mL IVPB     2 g 200 mL/hr over 30 Minutes Intravenous  Once 04/16/18 1304 04/16/18 1419       Subjective: Denies chest pain, shortness of breath, nausea, vomiting, abdominal pain or any other acute complaints.  Reports good urine output.  Objective: Vitals:   04/25/18 2214 04/25/18 2233 04/25/18 2234 04/26/18 0640  BP:    (!) 149/99  Pulse: (!) 103 (!) 108 (!) 108 83  Resp:  16 16 20   Temp:    98.8 F (37.1 C)  TempSrc:    Oral  SpO2: 95% 96% 96% 99%  Weight:      Height:        Intake/Output Summary (Last 24 hours) at 04/26/2018  1728 Last data filed at 04/26/2018 8119 Gross per 24 hour  Intake 480 ml  Output 800 ml  Net -320 ml   Filed Weights   04/22/18 0604 04/23/18 0613 04/24/18 0400  Weight: (!) 165.2 kg (!) 160.2 kg (!) 163.6 kg    Examination: General exam: Alert, awake, oriented x 3, in no acute distress.  Denies chest pain, shortness of breath, nausea, vomiting, abdominal pain or palpitations.  Patient is afebrile. Respiratory system: Decreased breath sounds at the bases, otherwise good air movement bilaterally.  Respiratory effort normal.  No using oxygen supplementation currently. Cardiovascular system: Rate controlled. No murmurs, rubs or gallops.  Unable to assess JVD due to body habitus. Gastrointestinal system: Abdomen is obese, nondistended, soft and nontender. No organomegaly or masses felt. Normal bowel sounds heard. Central nervous system: Alert and oriented. No focal neurological deficits. Extremities: No cyanosis or clubbing.  Left hip wound continues to have mild serosanguineous drainage.  No erythema or superimposed infection appreciated on exam. Skin: No rashes, lesions or ulcers Psychiatry: Judgement and insight appear normal. Mood & affect appropriate.    Data Reviewed: I have personally reviewed following  labs and imaging studies  CBC: Recent Labs  Lab 04/21/18 0424 04/22/18 0407 04/23/18 0512 04/24/18 0554 04/25/18 0708  WBC 8.9 9.6 9.8 8.0 10.3  HGB 8.6* 9.5* 9.2* 8.7* 9.9*  HCT 29.0* 32.3* 30.7* 29.1* 33.9*  MCV 97.6 97.0 95.3 96.7 97.7  PLT 185 240 195 221 683   Basic Metabolic Panel: Recent Labs  Lab 04/21/18 0424 04/22/18 0407 04/23/18 0512 04/24/18 0554 04/25/18 0708  NA 141 142 143 143 143  K 4.4 4.1 3.8 3.6 3.5  CL 107 104 105 105 103  CO2 29 29 29  34* 32  GLUCOSE 114* 97 106* 115* 114*  BUN 17 16 21  29* 25*  CREATININE 1.09 1.02 1.21 1.38* 1.14  CALCIUM 8.3* 8.7* 8.4* 8.1* 8.4*  MG  --  1.8  --   --   --    GFR: Estimated Creatinine Clearance: 89.5  mL/min (by C-G formula based on SCr of 1.14 mg/dL).  Coagulation Profile: Recent Labs  Lab 04/22/18 0407 04/23/18 0512 04/24/18 0554 04/25/18 0708 04/26/18 0600  INR 1.26 1.36 1.39 1.35 1.45   Urine analysis:    Component Value Date/Time   COLORURINE YELLOW 04/16/2018 1440   APPEARANCEUR HAZY (A) 04/16/2018 1440   APPEARANCEUR Clear 01/18/2018 1539   LABSPEC 1.021 04/16/2018 1440   PHURINE 5.0 04/16/2018 1440   GLUCOSEU NEGATIVE 04/16/2018 1440   HGBUR NEGATIVE 04/16/2018 1440   BILIRUBINUR NEGATIVE 04/16/2018 1440   BILIRUBINUR Negative 01/18/2018 1539   KETONESUR NEGATIVE 04/16/2018 1440   PROTEINUR 30 (A) 04/16/2018 1440   UROBILINOGEN 0.2 09/06/2012 0213   NITRITE NEGATIVE 04/16/2018 1440   LEUKOCYTESUR MODERATE (A) 04/16/2018 1440   LEUKOCYTESUR Negative 01/18/2018 1539    Recent Results (from the past 240 hour(s))  MRSA PCR Screening     Status: None   Collection Time: 04/16/18  5:30 PM  Result Value Ref Range Status   MRSA by PCR NEGATIVE NEGATIVE Final    Comment:        The GeneXpert MRSA Assay (FDA approved for NASAL specimens only), is one component of a comprehensive MRSA colonization surveillance program. It is not intended to diagnose MRSA infection nor to guide or monitor treatment for MRSA infections. Performed at [facility], [address], [place], [place] [address]      Radiology Studies: No results found.   Scheduled Meds: . busPIRone  5 mg Oral TID  . cholecalciferol  2,000 Units Oral QPM  . cycloSPORINE  1 drop Both Eyes BID  . darifenacin  15 mg Oral Daily  . [START ON 04/27/2018] diltiazem  180 mg Oral Daily  . diltiazem  60 mg Oral TID  . docusate sodium  100 mg Oral BID  . famotidine  20 mg Oral Daily  . furosemide  40 mg Oral Daily  . gabapentin  300 mg Oral Daily  . heparin  5,000 Units Subcutaneous Q8H  . mouth rinse  15 mL Mouth Rinse BID  . methadone  10 mg Oral QID  . metoprolol succinate  100 mg Oral BID  .  nystatin  1 g Topical Daily  . pantoprazole  40 mg Oral Daily  . polyethylene glycol  17 g Oral BID  . predniSONE  7.5 mg Oral BID WC  . venlafaxine XR  300 mg Oral Q breakfast  . Warfarin - Pharmacist Dosing Inpatient   Does not apply q1800   Continuous Infusions: . cefTRIAXone (ROCEPHIN)  IV 2 g (04/26/18 1605)     LOS:  10 days    Time spent: 30 minutes    Barton Dubois, MD [facility] Pager [phone]  If 7PM-7AM, please contact night-coverage [email or web address] Password [facility] 04/26/2018, 5:28 PM

## 2018-04-26 NOTE — Progress Notes (Signed)
Patient requested IV to be taken out.  Patient stated IV access in arm was sore.  Attempted to restart IV, patient refused iv to be restarted.  Notified mid-level.

## 2018-04-26 NOTE — Progress Notes (Signed)
Progress Note  Patient Name: [name] Date of Encounter: 04/26/2018  Primary Cardiologist: Dr. Kate Sable  Subjective   Breathing has improved.  No active palpitations or chest pain.  Inpatient Medications    Scheduled Meds: . busPIRone  5 mg Oral TID  . cholecalciferol  2,000 Units Oral QPM  . cycloSPORINE  1 drop Both Eyes BID  . darifenacin  15 mg Oral Daily  . diltiazem  60 mg Oral TID  . docusate sodium  100 mg Oral BID  . famotidine  20 mg Oral Daily  . furosemide  40 mg Oral Daily  . gabapentin  300 mg Oral Daily  . heparin  5,000 Units Subcutaneous Q8H  . mouth rinse  15 mL Mouth Rinse BID  . methadone  10 mg Oral QID  . metoprolol succinate  100 mg Oral BID  . nystatin  1 g Topical Daily  . pantoprazole  40 mg Oral Daily  . polyethylene glycol  17 g Oral BID  . predniSONE  7.5 mg Oral BID WC  . venlafaxine XR  300 mg Oral Q breakfast  . warfarin  7.5 mg Oral Once  . Warfarin - Pharmacist Dosing Inpatient   Does not apply q1800   Continuous Infusions: . cefTRIAXone (ROCEPHIN)  IV Stopped (04/25/18 1610)   PRN Meds: acetaminophen **OR** acetaminophen, bisacodyl, guaiFENesin-dextromethorphan, haloperidol lactate, menthol-cetylpyridinium, ondansetron **OR** ondansetron (ZOFRAN) IV, phenol   Vital Signs    Vitals:   04/25/18 2214 04/25/18 2233 04/25/18 2234 04/26/18 0640  BP:    (!) 149/99  Pulse: (!) 103 (!) 108 (!) 108 83  Resp:  16 16 20   Temp:    98.8 F (37.1 C)  TempSrc:    Oral  SpO2: 95% 96% 96% 99%  Weight:      Height:        Intake/Output Summary (Last 24 hours) at 04/26/2018 1053 Last data filed at 04/26/2018 0926 Gross per 24 hour  Intake 1260 ml  Output 4800 ml  Net -3540 ml   Filed Weights   04/22/18 0604 04/23/18 0613 04/24/18 0400  Weight: (!) 165.2 kg (!) 160.2 kg (!) 163.6 kg    Telemetry    Atrial fibrillation.  Personally reviewed.  Physical Exam   GEN:  Morbidly obese.  No acute distress.   Neck: No  JVD. Cardiac:  Irregularly irregular, no gallop.  Respiratory:  Decreased breath sounds without wheezing. Clear to auscultation bilaterally. GI:  Obese, nontender, bowel sounds present. MS: No edema; No deformity. Neuro:  Nonfocal. Psych: Alert and oriented x 3. Normal affect.  Labs    Chemistry Recent Labs  Lab 04/23/18 0512 04/24/18 0554 04/25/18 0708  NA 143 143 143  K 3.8 3.6 3.5  CL 105 105 103  CO2 29 34* 32  GLUCOSE 106* 115* 114*  BUN 21 29* 25*  CREATININE 1.21 1.38* 1.14  CALCIUM 8.4* 8.1* 8.4*  GFRNONAA 58* 50* >60  GFRAA >60 58* >60  ANIONGAP 9 4* 8     Hematology Recent Labs  Lab 04/23/18 0512 04/24/18 0554 04/25/18 0708  WBC 9.8 8.0 10.3  RBC 3.22* 3.01* 3.47*  HGB 9.2* 8.7* 9.9*  HCT 30.7* 29.1* 33.9*  MCV 95.3 96.7 97.7  MCH 28.6 28.9 28.5  MCHC 30.0 29.9* 29.2*  RDW 14.7 15.1 14.9  PLT 195 221 257    Radiology    No results found.  Cardiac Studies   Echocardiogram 04/17/2018: Study Conclusions  - Left ventricle: The  cavity size was normal. There was moderate   concentric hypertrophy. Systolic function was normal. The   estimated ejection fraction was in the range of 50% to 55%.   Diffuse hypokinesis. - Aortic valve: Trileaflet; normal thickness leaflets. There was no   regurgitation. - Mitral valve: There was no regurgitation. - Left atrium: The atrium was mildly dilated. - Right ventricle: The cavity size was normal. Wall thickness was   normal. Systolic function was normal. - Tricuspid valve: There was no regurgitation. - Pulmonary arteries: Systolic pressure could not be accurately   estimated. - Inferior vena cava: The vessel was normal in size. - Pericardium, extracardiac: There was no pericardial effusion.  Impressions:  - LVEF mildly diffusely decreased most probably secondary to atrial   fibrillation with RVR dusring acquisition.  Patient Profile     72 y.o. male with a history of morbid obesity, postoperative  wound infection status post hip surgery in June, persistent atrial fibrillation in the setting of pneumonia, and acute on chronic diastolic heart failure.  Assessment & Plan    1.  Persistent atrial fibrillation.  Plan at this time is to treat with strategy of heart rate control and anticoagulation.  CHADSVASC score is 2.  He is on Coumadin per pharmacy for stroke prophylaxis, INR yet not yet therapeutic.  Heart rate control regimen includes short acting Cardizem and Toprol-XL.  2.  Chronic diastolic heart failure, LVEF 50 to 55% by recent echocardiogram.  He is currently on oral Lasix with reasonable urine output.  Did receive dose of IV Lasix late last week.  Reports improved breathing status overall.  Relatively stable from a cardiac perspective.  Continue Coumadin per pharmacy with goal INR of 2.0-3.0.  Continue Toprol-XL at current dose and change from short acting Cardizem to Cardizem CD 180 mg daily.  Otherwise continue Lasix, may ultimately need potassium supplement, would follow-up BMET.  No further inpatient cardiac testing is planned at this time.  We will sign off. Please call [phone] to arrange outpatient follow-up with Dr. Bronson Ing or APP within 2 weeks of discharge,  Signed, Rozann Lesches, MD  04/26/2018, 10:53 AM

## 2018-04-27 DIAGNOSIS — F329 Major depressive disorder, single episode, unspecified: Secondary | ICD-10-CM

## 2018-04-27 DIAGNOSIS — G4733 Obstructive sleep apnea (adult) (pediatric): Secondary | ICD-10-CM

## 2018-04-27 DIAGNOSIS — F32A Depression, unspecified: Secondary | ICD-10-CM

## 2018-04-27 DIAGNOSIS — E66813 Obesity, class 3: Secondary | ICD-10-CM

## 2018-04-27 DIAGNOSIS — R410 Disorientation, unspecified: Secondary | ICD-10-CM

## 2018-04-27 LAB — BASIC METABOLIC PANEL
Anion gap: 7 (ref 5–15)
BUN: 22 mg/dL (ref 8–23)
CO2: 35 mmol/L — ABNORMAL HIGH (ref 22–32)
Calcium: 8.6 mg/dL — ABNORMAL LOW (ref 8.9–10.3)
Chloride: 104 mmol/L (ref 98–111)
Creatinine, Ser: 1.09 mg/dL (ref 0.61–1.24)
GFR calc Af Amer: >60 mL/min (ref >60)
GFR calc non Af Amer: >60 mL/min (ref >60)
Glucose, Bld: 98 mg/dL (ref 70–99)
Potassium: 4.8 mmol/L (ref 3.5–5.1)
Sodium: 146 mmol/L — ABNORMAL HIGH (ref 135–145)

## 2018-04-27 LAB — PROTIME-INR
INR: 1.68
Prothrombin Time: 19.6 seconds — ABNORMAL HIGH (ref 11.4–15.2)

## 2018-04-27 MED ORDER — WARFARIN SODIUM 7.5 MG PO TABS: 7.5000 mg | ORAL_TABLET | Freq: Every day | ORAL | 0 refills | Status: DC

## 2018-04-27 MED ORDER — ALUM & MAG HYDROXIDE-SIMETH 200-200-20 MG/5ML PO SUSP: 15.0000 mL | Freq: Four times a day (QID) | ORAL | Status: DC | PRN

## 2018-04-27 MED ORDER — GABAPENTIN 300 MG PO CAPS: 300.0000 mg | ORAL_CAPSULE | Freq: Three times a day (TID) | ORAL | Status: DC

## 2018-04-27 MED ORDER — GUAIFENESIN-DM 100-10 MG/5ML PO SYRP: 5.0000 mL | ORAL_SOLUTION | Freq: Four times a day (QID) | ORAL | Status: DC | PRN

## 2018-04-27 MED ORDER — ACETAMINOPHEN 325 MG PO TABS: 650.0000 mg | ORAL_TABLET | Freq: Four times a day (QID) | ORAL | Status: DC | PRN

## 2018-04-27 MED ORDER — METHADONE HCL 10 MG PO TABS: 10.0000 mg | ORAL_TABLET | Freq: Four times a day (QID) | ORAL | 0 refills | Status: DC

## 2018-04-27 MED ORDER — METHOCARBAMOL 500 MG PO TABS: 750.0000 mg | ORAL_TABLET | Freq: Three times a day (TID) | ORAL | Status: DC | PRN

## 2018-04-27 MED ORDER — BUSPIRONE HCL 5 MG PO TABS: 5.0000 mg | ORAL_TABLET | Freq: Three times a day (TID) | ORAL | 0 refills | Status: DC

## 2018-04-27 MED ORDER — METOPROLOL SUCCINATE ER 100 MG PO TB24: 100.0000 mg | ORAL_TABLET | Freq: Two times a day (BID) | ORAL | Status: DC

## 2018-04-27 MED ORDER — SODIUM CHLORIDE 0.9 % IV SOLN: 2.0000 g | INTRAVENOUS | Status: AC

## 2018-04-27 MED ORDER — PANTOPRAZOLE SODIUM 40 MG PO TBEC: 40.0000 mg | DELAYED_RELEASE_TABLET | Freq: Every day | ORAL | Status: DC

## 2018-04-27 MED ORDER — OXYCODONE HCL 5 MG PO TABS: 5.0000 mg | ORAL_TABLET | Freq: Four times a day (QID) | ORAL | 0 refills | Status: DC | PRN

## 2018-04-27 MED ORDER — DILTIAZEM HCL ER COATED BEADS 180 MG PO CP24: 180.0000 mg | ORAL_CAPSULE | Freq: Every day | ORAL | Status: DC

## 2018-04-27 MED ORDER — WARFARIN SODIUM 7.5 MG PO TABS
7.5000 mg | ORAL_TABLET | Freq: Once | ORAL | Status: AC
  Administered 2018-04-27: 7.5 mg via ORAL
  Filled 2018-04-27: qty 1

## 2018-04-27 MED ORDER — FUROSEMIDE 40 MG PO TABS: 40.0000 mg | ORAL_TABLET | Freq: Every day | ORAL | Status: DC

## 2018-04-27 MED ORDER — FAMOTIDINE 20 MG PO TABS: 20.0000 mg | ORAL_TABLET | Freq: Every day | ORAL | Status: DC

## 2018-04-27 NOTE — Care Management (Addendum)
Late Entry: Pt seen morning of 04/27/18. He is interested in SNF. He was recently at [facility]. Getting IV abx through [facility] pta. Lives alone, has daughter that lives nearby and ex-wife who is a Marine scientist and helps. Initially thought pt was in co-pay days, after verification with [facility] (pt says he would like to go back there) pt's stay would be covered at 100%. Pt will need insurance auth prior to DC. CSW aware. [facility] rep, Romualdo Bolk, aware.

## 2018-04-27 NOTE — Clinical Social Work Note (Signed)
Clinical Social Work Assessment  Patient Details  Name: [name] MRN: [id number] Date of Birth: 1946-08-23  Date of referral:  04/27/18               Reason for consult:  Facility Placement                Permission sought to share information with:    Permission granted to share information::     Name::        Agency::     Relationship::     Contact Information:     Housing/Transportation Living arrangements for the past 2 months:  Single Family Home Source of Information:  Patient Patient Interpreter Needed:  None Criminal Activity/Legal Involvement Pertinent to Current Situation/Hospitalization:  No - Comment as needed Significant Relationships:  Friend, Adult Children Lives with:  Adult Children Do you feel safe going back to the place where you live?  Yes Need for family participation in patient care:  Yes (Comment)  Care giving concerns:  None identified by patient.    Social Worker assessment / plan:  Patient will discharge to [facility] today.  Patient's friend, [name], will pick patient up, stop him by his house to pick up his CPAP machine then take him to [facility].  LCSW signing off.  Employment status:  Retired Nurse, adult PT Recommendations:  [name] / Referral to community resources:  [place]  Patient/Family's Response to care:  Patient is agreeable to SNF.   Patient/Family's Understanding of and Emotional Response to Diagnosis, Current Treatment, and Prognosis:  Patient understands his diagnosis, treatment and prognosis and feels SNF is needed to get back to his baseline of ambulating with an assistive device.   Emotional Assessment Appearance:  Appears stated age Attitude/Demeanor/Rapport:    Affect (typically observed):  Accepting, Calm Orientation:  Oriented to Self, Oriented to Place, Oriented to  Time, Oriented to Situation Alcohol / Substance use:  Not Applicable Psych  involvement (Current and /or in the community):  No (Comment)  Discharge Needs  Concerns to be addressed:  Discharge Planning Concerns Readmission within the last 30 days:  Yes Current discharge risk:  None Barriers to Discharge:  No Barriers Identified   Ihor Gully, LCSW 04/27/2018, 3:54 PM

## 2018-04-27 NOTE — Clinical Social Work Placement (Signed)
   CLINICAL SOCIAL WORK PLACEMENT  NOTE  Date:  04/27/2018  Patient Details  Name: [name] MRN: [id number] Date of Birth: Apr 03, 1946  Clinical Social Work is seeking post-discharge placement for this patient at the [place] level of care (*CSW will initial, date and re-position this form in  chart as items are completed):  Yes   Patient/family provided with [facility] Work Department's list of facilities offering this level of care within the geographic area requested by the patient (or if unable, by the patient's family).  Yes   Patient/family informed of their freedom to choose among providers that offer the needed level of care, that participate in Medicare, Medicaid or managed care program needed by the patient, have an available bed and are willing to accept the patient.  Yes   Patient/family informed of [place]'s ownership interest in [facility] and [facility], as well as of the fact that they are under no obligation to receive care at these facilities.  PASRR submitted to EDS on 04/26/18     PASRR number received on 04/26/18     Existing PASRR number confirmed on       FL2 transmitted to all facilities in geographic area requested by pt/family on 04/26/18     FL2 transmitted to all facilities within larger geographic area on       Patient informed that his/her managed care company has contracts with or will negotiate with certain facilities, including the following:        Yes   Patient/family informed of bed offers received.  Patient chooses bed at [facility]     Physician recommends and patient chooses bed at      Patient to be transferred to [facility] on 04/27/18.  Patient to be transferred to facility by Per patient, his friend [name].      Patient family notified on 04/27/18 of transfer.  Name of family member notified:  Su Grand     PHYSICIAN       Additional Comment:     _______________________________________________ Ihor Gully, LCSW 04/27/2018, 4:21 PM

## 2018-04-27 NOTE — Discharge Summary (Signed)
Physician Discharge Summary  [name] JAS:[id number] DOB: May 09, 1946 DOA: 04/16/2018  PCP: [name], MD  Admit date: 04/16/2018 Discharge date: 04/27/2018  Time spent: 35 minutes  Recommendations for Outpatient Follow-up:  Follow low-sodium diet (less than 2 g on daily basis). 1 more dose of IV antibiotics through peripheral line to complete antibiotic therapy for his infected hip surgical wound. Follow-up with orthopedic service as previously instructed. Repeat basic metabolic panel in 1 week to follow-up electrolytes and renal function Repeat CBC in 5 days to follow hemoglobin trend as he is now actively receiving anticoagulation. Please check INR in 2 days to follow Coumadin level.  Further adjust Coumadin dosage as needed. Contact cardiology office for appointment details with them and set up info at Coumaidin clinic.   Discharge Diagnoses:  Principal Problem:   Atrial fibrillation with RVR ([place]) Active Problems:   Hypertension   Left hip postoperative wound infection   Prosthetic joint infection of left hip (HCC)   HCAP (healthcare-associated pneumonia)   Morbid obesity with BMI of 50.0-59.9, adult (HCC)   Acute kidney injury ([place])   Visual hallucination   SIRS (systemic inflammatory response syndrome) (HCC)   Anemia of chronic disease   Delirium   Depression   OSA (obstructive sleep apnea)   Obesity, Class III, BMI 40-49.9 (morbid obesity) ([place])   Discharge Condition: Stable and improved.  Discharge back to skilled nursing facility [facility]) for further rehabilitation and care.  Patient will need outpatient follow-up with cardiology and also with orthopedic service.  1 more day of antibiotics pending at discharge to complete treatment with Rocephin.  Diet recommendation: Heart healthy and low-calorie diet.  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 Wound culture grew E. coli and Enterobacter aerogenous for which she was discharged home on IV Rocephin for 6 weeks duration (stop date 04/28/2018). Patient presented to the ED with shortness of breath and weakness for past 3 days. Other symptoms include visual hallucinations, poor p.o. intake and decreased mobility. In the ED he was found to be in rapid A. fib, tachypneic and an acute kidney injury. Chest x-ray showed multilobar pneumonia. Admitted to stepdown unit for Sepsis with Healthcare associated pneumonia and new onset A. fib with RVR.  Hospital Course:  1-Atrial fibrillation with RVR ([place]) -new diagnosis -most likely triggered by sepsis -CHADsVASC  Score 2-3 -will continue adjusted dose of b-blocker and oral cardizem as per cardiology rec's -continue building up coumadin for secondary prevention. INR 1.68 at discharge. Will need coumadin level in 2 days. Discharge on coumadin 7.5 mg daily.  -outpatient follow up with cardiology service.   2-Hypertension -BP stable and well controlled.  -follow VS and continue current antihypertensive agents.   3-sepsis due to HCAP -treated with broad spectrum antibiotics, and them levaquin. -patient is afebrile and no complaining of SOB. -levaquin discontinued ; patient receiving extended treatment for wound infection after hip surgery with rocephin. -continue to use flutter valve/IS -no oxygen supplementation at discharge. -will need rehabilitation at [facility].  4-left hip postoperative infection/prothetic joint infection -s/p I and D on 7/17 -cx's  positive for E. Coli and  enterobacter -continue IV rocephin  -last date of antibiotics 8/28 -outpatient follow up with orthopedic service (Dr. Maureen Ralphs) -left hip still with mild serosanguineous suppurative drainage; but wound overall no erythematous or having signs of superimposed infection.   5-OSA and obesity hypoventilation syndrome  -continue CPAP QHS  6-morbid obesity -Body mass index is 54.84 kg/m. -low calorie diet and portion control discussed with patient  7-AKI -in the setting of poor hydration/poor oral intake and then diuresis -improved/resolved with IVF's and adjustment in lasix dose. -follow renal function trend. -maintain adequate hydration.  8-anemia of chronic disease -Hgb baseline 8.5-9.5 -will follow Hgb trend, especially with active anticoagulation transition . -no signs of overt bleeding  -repeat CBC to follow Hgb trend   9-temporal arteritis -continue steroids tapering as managed by PCP as an outpatient.   10-physical deconditioning  -per PT will need to return back to SNF at discharge for further rehabilitation.  11-acute on chronic diastolic HF -received one dose of IV lasix on 8/22 and 8/23 -CXR demonstrating vascular congestion and mild interstitial edema. -follow daily weights and low sodium diet. -creatinine trended up on 8/24 and lasix was transitioned to PO -so far well tolerated and his renal function is back to normal.  -follow renal function closely.  12-hospital acquired delirium  -resolved with use of ativan and haldol. -oriented X3 today and with mentation back to normal. -continue constant reorientation -appears to be at baseline now.  13-depression/anxiety -continue Buspar TID and resume effexor.  -no SI or hallucinations.   Procedures:  See below for x-ray reports   2-D echo - Left ventricle: The cavity size was normal. There was moderate   concentric hypertrophy. Systolic function was normal. The   estimated ejection fraction was in the  range of 50% to 55%.   Diffuse hypokinesis. - Aortic valve: Trileaflet; normal thickness leaflets. There was no   regurgitation. - Mitral valve: There was no regurgitation. - Left atrium: The atrium was mildly dilated. - Right ventricle: The cavity size was normal. Wall thickness was   normal. Systolic function was normal. - Tricuspid valve: There was no regurgitation. - Pulmonary arteries: Systolic pressure could not be accurately   estimated. - Inferior vena cava: The vessel was normal in size. - Pericardium, extracardiac: There was no pericardial effusion.  Impressions: - LVEF mildly diffusely decreased most probably secondary to atrial   fibrillation with RVR dusring acquisition.  Consultations:  Cardiology   Discharge Exam: Vitals:   04/27/18 0529 04/27/18 1322  BP: 124/85 118/80  Pulse: (!) 110 (!) 116  Resp:  20  Temp: 98.3 F (36.8 C) 98.9 F (37.2 C)  SpO2: 94% 96%   General exam: Alert, awake, oriented x 3, in no acute distress.  Denies chest pain, shortness of breath, nausea, vomiting, abdominal pain or palpitations.  Patient is afebrile and with good O2 sat on RA currently.  Respiratory system: Decreased breath sounds at the bases, otherwise good air movement bilaterally.  Respiratory effort normal.   Cardiovascular system: Rate controlled. No murmurs, rubs or gallops.  Unable to assess JVD due to body habitus. Gastrointestinal system: Abdomen is obese, nondistended, soft and nontender. No organomegaly or masses felt. Normal bowel sounds heard. Central nervous system: Alert and oriented. No focal neurological deficits. Extremities: No cyanosis or clubbing.  Left hip wound continues to have mild serosanguineous drainage.  No erythema or superimposed infection appreciated on exam. Skin: No rashes, lesions or ulcers Psychiatry: Judgement and insight appear  normal. Mood & affect appropriate.    Discharge Instructions   Discharge Instructions    (HEART FAILURE  PATIENTS) Call MD:  Anytime you have any of the following symptoms: 1) 3 pound weight gain in 24 hours or 5 pounds in 1 week 2) shortness of breath, with or without a dry hacking cough 3) swelling in the hands, feet or stomach 4) if you have to sleep on extra pillows at night in order to breathe.   Complete by:  As directed    Diet - low sodium heart healthy   Complete by:  As directed    Discharge instructions   Complete by:  As directed    Maintain adequate hydration Follow low-sodium diet (less than 2 g on daily basis). 1 more dose of IV antibiotics through peripheral line to complete antibiotic therapy for his infected hip surgical wound. Follow-up with orthopedic service as previously instructed. Repeat basic metabolic panel in 1 week to follow-up electrolytes and renal function Repeat CBC in 5 days to follow hemoglobin trend as he is now actively receiving anticoagulation. Please check INR in 2 days to follow Coumadin level.  Further adjust Coumadin dosage as needed. Contact cardiology office for appointment details with them and set up info at Coumaidin clinic.     Allergies as of 04/27/2018      Reactions   Bee Venom Swelling      Medication List    STOP taking these medications   amitriptyline 25 MG tablet Commonly known as:  ELAVIL   amLODipine 10 MG tablet Commonly known as:  NORVASC   aspirin EC 81 MG tablet   cefTRIAXone  IVPB Commonly known as:  ROCEPHIN   celecoxib 200 MG capsule Commonly known as:  CELEBREX   diphenhydrAMINE 25 MG tablet Commonly known as:  BENADRYL   enoxaparin 40 MG/0.4ML injection Commonly known as:  LOVENOX   gabapentin 600 MG tablet Commonly known as:  NEURONTIN Replaced by:  gabapentin 300 MG capsule   mometasone 0.1 % cream Commonly known as:  ELOCON   mupirocin ointment 2 % Commonly known as:  BACTROBAN   omeprazole 40 MG capsule Commonly known as:  PRILOSEC Replaced by:  pantoprazole 40 MG tablet   ondansetron 4 MG  tablet Commonly known as:  ZOFRAN   PRESCRIPTION MEDICATION     TAKE these medications   acetaminophen 325 MG tablet Commonly known as:  TYLENOL Take 2 tablets (650 mg total) by mouth every 6 (six) hours as needed for mild pain, fever or headache. What changed:    when to take this  reasons to take this   alum & mag hydroxide-simeth 200-200-20 MG/5ML suspension Commonly known as:  MAALOX/MYLANTA Take 15 mLs by mouth every 6 (six) hours as needed for indigestion or heartburn.   busPIRone 5 MG tablet Commonly known as:  BUSPAR Take 1 tablet (5 mg total) by mouth 3 (three) times daily.   cefTRIAXone 2 g in sodium chloride 0.9 % 100 mL Inject 2 g into the vein daily for 1 day. Start taking on:  04/28/2018   CENTRUM ADULTS PO Take 1 tablet by mouth every evening.   cycloSPORINE 0.05 % ophthalmic emulsion Commonly known as:  RESTASIS Place 1 drop into both eyes 2 (two) times daily.   diltiazem 180 MG 24 hr capsule Commonly known as:  CARDIZEM CD Take 1 capsule (180 mg total) by mouth daily. Start taking on:  04/28/2018   docusate sodium 100 MG capsule Commonly known as:  COLACE Take 1 capsule (100 mg total) by mouth 2 (two) times daily.   eucerin cream Apply 1 application topically daily.   famotidine 20 MG tablet Commonly known as:  PEPCID Take 1 tablet (20 mg total) by mouth at bedtime. What changed:  when to take this   furosemide 40 MG tablet Commonly known as:  LASIX Take 1 tablet (40 mg total) by mouth daily. Start taking on:  04/28/2018 What changed:    medication strength  how much to take  when to take this  reasons to take this   gabapentin 300 MG capsule Commonly known as:  NEURONTIN Take 1 capsule (300 mg total) by mouth 3 (three) times daily. Replaces:  gabapentin 600 MG tablet   guaiFENesin-dextromethorphan 100-10 MG/5ML syrup Commonly known as:  ROBITUSSIN DM Take 5 mLs by mouth every 6 (six) hours as needed for cough.   methadone 10 MG  tablet Commonly known as:  DOLOPHINE Take 1 tablet (10 mg total) by mouth 4 (four) times daily.   methocarbamol 500 MG tablet Commonly known as:  ROBAXIN Take 1.5 tablets (750 mg total) by mouth every 8 (eight) hours as needed for muscle spasms. What changed:    medication strength  when to take this  reasons to take this   metoprolol succinate 100 MG 24 hr tablet Commonly known as:  TOPROL-XL Take 1 tablet (100 mg total) by mouth 2 (two) times daily. Take with or immediately following a meal.   nystatin powder Commonly known as:  MYCOSTATIN/NYSTOP Apply 1 g topically daily. Apply to right and left abdominal folds topically every day shift for rash   oxyCODONE 5 MG immediate release tablet Commonly known as:  Oxy IR/ROXICODONE Take 1 tablet (5 mg total) by mouth every 6 (six) hours as needed for moderate pain, severe pain or breakthrough pain. What changed:  reasons to take this   pantoprazole 40 MG tablet Commonly known as:  PROTONIX Take 1 tablet (40 mg total) by mouth daily. Start taking on:  04/28/2018 Replaces:  omeprazole 40 MG capsule   polyethylene glycol packet Commonly known as:  MIRALAX / GLYCOLAX Take 17 g by mouth daily as needed for mild constipation.   predniSONE 2.5 MG tablet Commonly known as:  DELTASONE Take 3 tablets (7.5 mg total) by mouth 2 (two) times daily with a meal.   solifenacin 10 MG tablet Commonly known as:  VESICARE Take 10 mg by mouth daily.   Testosterone Propionate Powd Apply 0.5 mLs topically as directed. Apply 2 clicks (1.8HU) once a day as directed   venlafaxine XR 150 MG 24 hr capsule Commonly known as:  EFFEXOR-XR Take 300 mg by mouth daily with breakfast.   Vitamin D3 2000 units Tabs Take 2,000 Units by mouth every evening.   warfarin 7.5 MG tablet Commonly known as:  COUMADIN Take 1 tablet (7.5 mg total) by mouth daily at 6 PM.      Allergies  Allergen Reactions  . Bee Venom Swelling    Contact information for  follow-up providers    Arnoldo Lenis, MD. Schedule an appointment as soon as possible for a visit in 2 week(s).   Specialty:  Cardiology Contact information: [address] [place] [address] [phone]        Gaynelle Arabian, MD. Call today.   Specialty:  Orthopedic Surgery Why:  office for appointment details.  Contact information: [address] [place] [address] [phone]  Contact information for after-discharge care    [facility] .   Service:  Skilled Nursing Contact information: [address] [place] [phone]                  The results of significant diagnostics from this hospitalization (including imaging, microbiology, ancillary and laboratory) are listed below for reference.    Significant Diagnostic Studies: Ct Head Wo Contrast  Result Date: 04/16/2018 CLINICAL DATA:  Shortness of breath on exertion for the past 3 days. Status post left hip replacement 3 weeks ago. Focal neural deficit for greater than 6 hours. EXAM: CT HEAD WITHOUT CONTRAST TECHNIQUE: Contiguous axial images were obtained from the base of the skull through the vertex without intravenous contrast. COMPARISON:  Brain MR dated 01/18/2018 and head CT dated 05/11/2007. FINDINGS: Brain: Mildly enlarged ventricles and cortical sulci. Minimal patchy white matter low density in both cerebral hemispheres. No intracranial hemorrhage, mass lesion or CT evidence of acute infarction. Vascular: No hyperdense vessel or unexpected calcification. Skull: Mild bilateral hyperostosis frontalis. Sinuses/Orbits: Unremarkable. Other: None. IMPRESSION: 1. No acute abnormality. 2. Mild atrophy and minimal chronic small vessel white matter ischemic changes in both cerebral hemispheres. Electronically Signed   By: Claudie Revering M.D.   On: 04/16/2018 14:22   Dg Chest Port 1 View  Result Date:  04/23/2018 CLINICAL DATA:  Dyspnea, hypertension, ex smoker. EXAM: PORTABLE CHEST 1 VIEW COMPARISON:  04/16/2018 FINDINGS: Stable cardiomegaly with tortuous atherosclerotic aorta. Pulmonary vascular redistribution is identified. Minimal clearing of right upper lobe airspace disease. Persistent left mid and bibasilar airspace disease, atelectasis and/or scarring slightly increased at the right lung base. No acute osseous abnormality. IMPRESSION: 1. Cardiomegaly with aortic atherosclerosis. 2. Central vascular congestion consistent with mild CHF. 3. Pulmonary opacities in the right upper lobe have partially cleared. Persistent bibasilar and left mid lung airspace opacities, atelectasis and/or scarring persist, slightly increased at the right lung base. Electronically Signed   By: Ashley Royalty M.D.   On: 04/23/2018 13:18   Dg Chest Portable 1 View  Result Date: 04/16/2018 CLINICAL DATA:  Shortness of breath. Recent left hip replacement 3 weeks ago. EXAM: PORTABLE CHEST 1 VIEW COMPARISON:  03/15/2018 FINDINGS: Lungs are adequately inflated demonstrate airspace consolidation over the right upper lobe with mild patchy hazy density over the left midlung and right mid to lower lung. Suggestion partial collapse right middle lobe. Small left effusion slightly worse. Stable cardiomegaly. Remainder the exam is unchanged. IMPRESSION: Airspace consolidation right upper lobe with minimal patchy hazy opacification left midlung and right base likely multifocal pneumonia. Slight worsening small left effusion. Partial collapse right middle lobe. Stable cardiomegaly. Electronically Signed   By: Marin Olp M.D.   On: 04/16/2018 12:55   Labs: Basic Metabolic Panel: Recent Labs  Lab 04/22/18 0407 04/23/18 0512 04/24/18 0554 04/25/18 0708 04/27/18 0451  NA 142 143 143 143 146*  K 4.1 3.8 3.6 3.5 4.8  CL 104 105 105 103 104  CO2 29 29 34* 32 35*  GLUCOSE 97 106* 115* 114* 98  BUN 16 21 29* 25* 22  CREATININE 1.02 1.21  1.38* 1.14 1.09  CALCIUM 8.7* 8.4* 8.1* 8.4* 8.6*  MG 1.8  --   --   --   --    CBC: Recent Labs  Lab 04/21/18 0424 04/22/18 0407 04/23/18 0512 04/24/18 0554 04/25/18 0708  WBC 8.9 9.6 9.8 8.0 10.3  HGB 8.6* 9.5* 9.2* 8.7* 9.9*  HCT 29.0* 32.3* 30.7* 29.1* 33.9*  MCV 97.6 97.0 95.3 96.7 97.7  PLT 185 240 195 221 257   BNP (last 3 results) Recent Labs    04/16/18 1248  BNP 453.0*    Signed:  Barton Dubois MD.  [facility] 04/27/2018, 3:31 PM

## 2018-04-27 NOTE — Progress Notes (Signed)
Called report to [place] at [facility]. Gave patient d/c paperwork and preprinted prescriptions. Friend transported stable patient and belongings to [facility] in [place].

## 2018-04-27 NOTE — Progress Notes (Signed)
[place] for warfarin Indication: atrial fibrillation  Allergies  Allergen Reactions  . Bee Venom Swelling    Patient Measurements: Height: 5\' 8"  (172.7 cm) Weight: (!) 360 lb 10.8 oz (163.6 kg) IBW/kg (Calculated) : 68.4 Heparin Dosing Weight: 110 kg  Vital Signs: Temp: 98.3 F (36.8 C) (08/27 0529) Temp Source: Oral (08/27 0529) BP: 124/85 (08/27 0529) Pulse Rate: 110 (08/27 0529)  Labs: Recent Labs    04/25/18 0708 04/26/18 0600 04/27/18 0451  HGB 9.9*  --   --   HCT 33.9*  --   --   PLT 257  --   --   LABPROT 16.6* 17.5* 19.6*  INR 1.35 1.45 1.68  CREATININE 1.14  --  1.09    Estimated Creatinine Clearance: 93.6 mL/min (by C-G formula based on SCr of 1.09 mg/dL).   Assessment: Pharmacy consulted to dose warfarin for patient with atrial fibrillation. Cardiology does not recommend NOAC's due to weight. INR is 1.68 today and  trending up nicely, so will repeart yesterday's dose..  Goal of Therapy:  INR 2-3 Monitor platelets by anticoagulation protocol: Yes   Plan:  Repeat  warfarin 7.5mg  x 1 dose. Check INR daily Continue to monitor H&H and platelets   Despina Pole, Pharm. D. Clinical Pharmacist 04/27/2018 10:57 AM

## 2018-04-27 NOTE — Care Management Note (Signed)
Case Management Note  Patient Details  Name: [name] MRN: [id number] Date of Birth: 1946-06-28  If discussed at Long Length of Stay Meetings, dates discussed:  04/27/18  Additional Comments:  Sherald Barge, RN 04/27/2018, 12:08 PM

## 2018-04-27 NOTE — Progress Notes (Signed)
Physical Therapy Treatment Patient Details Name: [name] MRN: [id number] DOB: [birth year] Today's Date: 04/27/2018    History of Present Illness [name]  is a 72 y.o. male, with morbid obesity, hypertension, obstructive sleep apnea on CPAP, giant cell arteritis on prednisone taper, chronic diplopia following?  Left eye muscle repair, who underwent left total hip arthroplasty with revision on 02/17/2018 and was discharged to SNF.  He developed postop hematoma and subsequently at SNF was febrile with purulent discharge from his wound with foul order 2 weeks later.  He had a PICC line placed in and was started on vancomycin and Zosyn.  He was unable to ambulate due to pain and then presented to the hospital on 7/15.  He underwent I&D of his wound on 7/17.  The wound culture grew E. coli and Enterobacter Aerogenous.    PT Comments    Patient demonstrates labored movement for sitting up at bedside with assist to help pull self up, increased BLE strength for sit to stands, but required bed raised, able to take a few steps at bedside without loss of balance during transfer to wheelchair.  Patient tolerated sitting up in wheelchair after therapy - RN notified.  Patient will benefit from continued physical therapy in hospital and recommended venue below to increase strength, balance, endurance for safe ADLs and gait.    Follow Up Recommendations  SNF     Equipment Recommendations  None recommended by PT    Recommendations for Other Services       Precautions / Restrictions Precautions Precautions: Fall Restrictions Weight Bearing Restrictions: No    Mobility  Bed Mobility Overal bed mobility: Needs Assistance Bed Mobility: Supine to Sit Rolling: Min assist         General bed mobility comments: slightly labored movement, required assist to pull self up to sitting  Transfers Overall transfer level: Needs assistance Equipment used: Rolling walker (2 wheeled) Transfers: Sit  to/from [facility] Sit to Stand: Min guard Stand pivot transfers: Min guard       General transfer comment: slow slighlty labored movement with bed raised per patient's request to make it easier  Ambulation/Gait Ambulation/Gait assistance: Min assist Gait Distance (Feet): 3 Feet Assistive device: Rolling walker (2 wheeled) Gait Pattern/deviations: Decreased step length - right;Decreased step length - left;Decreased stride length Gait velocity: slow   General Gait Details: limited to 5-6 short unsteady side steps without loss of balance, limited secondary to c/o fatigue   Stairs             Wheelchair Mobility    Modified Rankin (Stroke Patients Only)       Balance Overall balance assessment: Needs assistance Sitting-balance support: Feet supported;No upper extremity supported Sitting balance-[name]: Good     Standing balance support: No upper extremity supported;During functional activity Standing balance-[name]: Fair Standing balance comment: fair/good with RW                            Cognition Arousal/Alertness: Awake/alert Behavior During Therapy: WFL for tasks assessed/performed Overall Cognitive Status: Within Functional Limits for tasks assessed                                        Exercises General Exercises - Lower Extremity Ankle Circles/Pumps: Seated;AROM;Strengthening;Both;10 reps Long Arc Quad: Seated;AROM;Strengthening;Both;10 reps    General Comments  Pertinent Vitals/Pain Pain Assessment: No/denies pain    Home Living                      Prior Function            PT Goals (current goals can now be found in the care plan section) Progress towards PT goals: Progressing toward goals    Frequency    Min 5X/week      PT Plan Current plan remains appropriate    Co-evaluation              AM-PAC PT "6 Clicks" Daily Activity  Outcome Measure   Difficulty turning over in bed (including adjusting bedclothes, sheets and blankets)?: A Little Difficulty moving from lying on back to sitting on the side of the bed? : A Little Difficulty sitting down on and standing up from a chair with arms (e.g., wheelchair, bedside commode, etc,.)?: A Little Help needed moving to and from a bed to chair (including a wheelchair)?: A Little Help needed walking in hospital room?: A Lot Help needed climbing 3-5 steps with a railing? : A Lot 6 Click Score: 16    End of Session Equipment Utilized During Treatment: Gait belt Activity Tolerance: Patient tolerated treatment well;Patient limited by fatigue Patient left: in chair;with call bell/phone within reach;with family/visitor present Nurse Communication: Mobility status;Other (comment)(RN notified that patient left up in wheelchair) PT Visit Diagnosis: Muscle weakness (generalized) (M62.81);Difficulty in walking, not elsewhere classified (R26.2);Pain     Time: 1610-9604 PT Time Calculation (min) (ACUTE ONLY): 28 min  Charges:  $Therapeutic Activity: 23-37 mins                     3:35 PM, 04/27/18 Lonell Grandchild, MPT Physical Therapist with [facility] 336 [phone] office [phone] mobile phone

## 2018-07-14 MED ORDER — SODIUM CHLORIDE FLUSH 0.9 % IV SOLN
10.00 | INTRAVENOUS | Status: DC
Start: 2018-07-14 — End: 2018-07-14

## 2018-07-14 MED ORDER — POLYETHYLENE GLYCOL 3350 17 G PO PACK
17.00 | PACK | ORAL | Status: DC
Start: 2018-07-15 — End: 2018-07-14

## 2018-07-14 MED ORDER — ACETAMINOPHEN 500 MG PO TABS
500.00 | ORAL_TABLET | ORAL | Status: DC
Start: 2018-07-14 — End: 2018-07-14

## 2018-07-14 MED ORDER — SODIUM CHLORIDE FLUSH 0.9 % IV SOLN
10.00 | INTRAVENOUS | Status: DC
Start: ? — End: 2018-07-14

## 2018-07-14 MED ORDER — GUAIFENESIN ER 600 MG PO TB12
600.00 | ORAL_TABLET | ORAL | Status: DC
Start: 2018-07-14 — End: 2018-07-14

## 2018-07-14 MED ORDER — ONDANSETRON HCL 4 MG/2ML IJ SOLN
4.00 | INTRAMUSCULAR | Status: DC
Start: ? — End: 2018-07-14

## 2018-07-14 MED ORDER — NYSTATIN 100000 UNIT/GM EX POWD
CUTANEOUS | Status: DC
Start: 2018-07-14 — End: 2018-07-14

## 2018-07-14 MED ORDER — BISACODYL 10 MG RE SUPP
10.00 | RECTAL | Status: DC
Start: ? — End: 2018-07-14

## 2018-07-14 MED ORDER — MIRTAZAPINE 7.5 MG PO TABS
7.50 | ORAL_TABLET | ORAL | Status: DC
Start: 2018-07-14 — End: 2018-07-14

## 2018-07-14 MED ORDER — GABAPENTIN 100 MG PO CAPS
100.00 | ORAL_CAPSULE | ORAL | Status: DC
Start: 2018-07-14 — End: 2018-07-14

## 2018-07-14 MED ORDER — GENERIC EXTERNAL MEDICATION
500.00 | Status: DC
Start: ? — End: 2018-07-14

## 2018-07-14 MED ORDER — APIXABAN 5 MG PO TABS
5.00 | ORAL_TABLET | ORAL | Status: DC
Start: 2018-07-14 — End: 2018-07-14

## 2018-07-14 MED ORDER — OXYCODONE HCL 5 MG PO TABS
10.00 | ORAL_TABLET | ORAL | Status: DC
Start: ? — End: 2018-07-14

## 2018-07-14 MED ORDER — CARBOXYMETHYLCELLULOSE SODIUM 0.5 % OP SOLN
1.00 | OPHTHALMIC | Status: DC
Start: ? — End: 2018-07-14

## 2018-07-14 MED ORDER — PANTOPRAZOLE SODIUM 40 MG PO TBEC
40.00 | DELAYED_RELEASE_TABLET | ORAL | Status: DC
Start: 2018-07-15 — End: 2018-07-14

## 2018-07-14 MED ORDER — DEXTROSE 10 % IV SOLN
125.00 | INTRAVENOUS | Status: DC
Start: ? — End: 2018-07-14

## 2018-07-14 MED ORDER — GENERIC EXTERNAL MEDICATION
1.00 | Status: DC
Start: ? — End: 2018-07-14

## 2018-07-14 MED ORDER — SENNOSIDES-DOCUSATE SODIUM 8.6-50 MG PO TABS
2.00 | ORAL_TABLET | ORAL | Status: DC
Start: 2018-07-14 — End: 2018-07-14

## 2018-07-14 MED ORDER — DILTIAZEM HCL ER COATED BEADS 120 MG PO CP24
240.00 | ORAL_CAPSULE | ORAL | Status: DC
Start: 2018-07-15 — End: 2018-07-14

## 2018-07-14 MED ORDER — THIAMINE HCL 100 MG PO TABS
100.00 | ORAL_TABLET | ORAL | Status: DC
Start: 2018-07-15 — End: 2018-07-14

## 2018-07-14 MED ORDER — BENZONATATE 100 MG PO CAPS
100.00 | ORAL_CAPSULE | ORAL | Status: DC
Start: 2018-07-14 — End: 2018-07-14

## 2018-07-14 MED ORDER — OXYCODONE HCL 5 MG PO TABS
5.00 | ORAL_TABLET | ORAL | Status: DC
Start: ? — End: 2018-07-14

## 2018-07-14 MED ORDER — HYDROXYZINE HCL 25 MG PO TABS
25.00 | ORAL_TABLET | ORAL | Status: DC
Start: ? — End: 2018-07-14

## 2018-07-14 MED ORDER — GENERIC EXTERNAL MEDICATION
1.00 | Status: DC
Start: 2018-07-15 — End: 2018-07-14

## 2018-07-14 MED ORDER — LIDOCAINE 4 % EX PTCH
1.00 | MEDICATED_PATCH | CUTANEOUS | Status: DC
Start: 2018-07-15 — End: 2018-07-14

## 2018-07-14 MED ORDER — HALOPERIDOL 1 MG PO TABS
1.00 | ORAL_TABLET | ORAL | Status: DC
Start: ? — End: 2018-07-14

## 2018-07-14 MED ORDER — GENERIC EXTERNAL MEDICATION
5.00 | Status: DC
Start: ? — End: 2018-07-14

## 2018-07-14 MED ORDER — METOPROLOL TARTRATE 25 MG PO TABS
25.00 | ORAL_TABLET | ORAL | Status: DC
Start: 2018-07-14 — End: 2018-07-14

## 2018-07-14 MED ORDER — MELATONIN 3 MG PO TABS
6.00 | ORAL_TABLET | ORAL | Status: DC
Start: 2018-07-14 — End: 2018-07-14

## 2018-07-14 MED ORDER — ALUMINUM-MAGNESIUM-SIMETHICONE 200-200-20 MG/5ML PO SUSP
30.00 | ORAL | Status: DC
Start: ? — End: 2018-07-14

## 2018-07-14 MED ORDER — IPRATROPIUM BROMIDE 0.02 % IN SOLN
.50 | RESPIRATORY_TRACT | Status: DC
Start: ? — End: 2018-07-14

## 2018-07-14 MED ORDER — FENTANYL CITRATE (PF) 50 MCG/ML IJ SOLN
INTRAMUSCULAR | Status: DC
Start: ? — End: 2018-07-14

## 2018-07-14 MED ORDER — IPRATROPIUM-ALBUTEROL 0.5-2.5 (3) MG/3ML IN SOLN
3.00 | RESPIRATORY_TRACT | Status: DC
Start: ? — End: 2018-07-14

## 2018-07-14 MED ORDER — FUROSEMIDE 20 MG PO TABS
20.00 | ORAL_TABLET | ORAL | Status: DC
Start: ? — End: 2018-07-14

## 2018-07-14 MED ORDER — QUETIAPINE FUMARATE 50 MG PO TABS
50.00 | ORAL_TABLET | ORAL | Status: DC
Start: 2018-07-14 — End: 2018-07-14

## 2018-07-26 ENCOUNTER — Other Ambulatory Visit: Payer: Self-pay | Admitting: Neurosurgery

## 2018-07-26 DIAGNOSIS — E237 Disorder of pituitary gland, unspecified: Secondary | ICD-10-CM

## 2018-08-17 ENCOUNTER — Inpatient Hospital Stay (HOSPITAL_COMMUNITY): Payer: Medicare Other

## 2018-08-17 ENCOUNTER — Inpatient Hospital Stay (HOSPITAL_COMMUNITY)
Admission: EM | Admit: 2018-08-17 | Discharge: 2018-09-02 | DRG: 308 | Disposition: A | Discharge status: Home Health | Payer: Medicare Other | Attending: Internal Medicine | Admitting: Internal Medicine

## 2018-08-17 ENCOUNTER — Emergency Department (HOSPITAL_COMMUNITY): Payer: Medicare Other

## 2018-08-17 ENCOUNTER — Other Ambulatory Visit: Payer: Self-pay

## 2018-08-17 ENCOUNTER — Encounter (HOSPITAL_COMMUNITY): Payer: Self-pay | Admitting: Emergency Medicine

## 2018-08-17 DIAGNOSIS — T8149XA Infection following a procedure, other surgical site, initial encounter: Secondary | ICD-10-CM | POA: Diagnosis present

## 2018-08-17 DIAGNOSIS — Z9989 Dependence on other enabling machines and devices: Secondary | ICD-10-CM | POA: Diagnosis not present

## 2018-08-17 DIAGNOSIS — I482 Chronic atrial fibrillation, unspecified: Principal | ICD-10-CM | POA: Diagnosis present

## 2018-08-17 DIAGNOSIS — K219 Gastro-esophageal reflux disease without esophagitis: Secondary | ICD-10-CM | POA: Diagnosis present

## 2018-08-17 DIAGNOSIS — Z91048 Other nonmedicinal substance allergy status: Secondary | ICD-10-CM

## 2018-08-17 DIAGNOSIS — T8141XA Infection following a procedure, superficial incisional surgical site, initial encounter: Secondary | ICD-10-CM | POA: Diagnosis present

## 2018-08-17 DIAGNOSIS — I2510 Atherosclerotic heart disease of native coronary artery without angina pectoris: Secondary | ICD-10-CM | POA: Diagnosis present

## 2018-08-17 DIAGNOSIS — Z79899 Other long term (current) drug therapy: Secondary | ICD-10-CM | POA: Diagnosis not present

## 2018-08-17 DIAGNOSIS — I110 Hypertensive heart disease with heart failure: Secondary | ICD-10-CM | POA: Diagnosis present

## 2018-08-17 DIAGNOSIS — Z91030 Bee allergy status: Secondary | ICD-10-CM

## 2018-08-17 DIAGNOSIS — Z96643 Presence of artificial hip joint, bilateral: Secondary | ICD-10-CM | POA: Diagnosis present

## 2018-08-17 DIAGNOSIS — R5383 Other fatigue: Secondary | ICD-10-CM | POA: Diagnosis present

## 2018-08-17 DIAGNOSIS — I5032 Chronic diastolic (congestive) heart failure: Secondary | ICD-10-CM | POA: Diagnosis present

## 2018-08-17 DIAGNOSIS — M316 Other giant cell arteritis: Secondary | ICD-10-CM | POA: Diagnosis present

## 2018-08-17 DIAGNOSIS — Z9049 Acquired absence of other specified parts of digestive tract: Secondary | ICD-10-CM | POA: Diagnosis not present

## 2018-08-17 DIAGNOSIS — G4733 Obstructive sleep apnea (adult) (pediatric): Secondary | ICD-10-CM | POA: Diagnosis present

## 2018-08-17 DIAGNOSIS — J918 Pleural effusion in other conditions classified elsewhere: Secondary | ICD-10-CM | POA: Diagnosis present

## 2018-08-17 DIAGNOSIS — Z7901 Long term (current) use of anticoagulants: Secondary | ICD-10-CM

## 2018-08-17 DIAGNOSIS — R0902 Hypoxemia: Secondary | ICD-10-CM | POA: Diagnosis present

## 2018-08-17 DIAGNOSIS — J90 Pleural effusion, not elsewhere classified: Secondary | ICD-10-CM | POA: Diagnosis present

## 2018-08-17 DIAGNOSIS — Z6841 Body Mass Index (BMI) 40.0 and over, adult: Secondary | ICD-10-CM

## 2018-08-17 DIAGNOSIS — D638 Anemia in other chronic diseases classified elsewhere: Secondary | ICD-10-CM | POA: Diagnosis present

## 2018-08-17 DIAGNOSIS — R112 Nausea with vomiting, unspecified: Secondary | ICD-10-CM | POA: Diagnosis present

## 2018-08-17 DIAGNOSIS — Z79891 Long term (current) use of opiate analgesic: Secondary | ICD-10-CM | POA: Diagnosis not present

## 2018-08-17 DIAGNOSIS — R6510 Systemic inflammatory response syndrome (SIRS) of non-infectious origin without acute organ dysfunction: Secondary | ICD-10-CM

## 2018-08-17 DIAGNOSIS — F329 Major depressive disorder, single episode, unspecified: Secondary | ICD-10-CM | POA: Diagnosis present

## 2018-08-17 DIAGNOSIS — F419 Anxiety disorder, unspecified: Secondary | ICD-10-CM | POA: Diagnosis present

## 2018-08-17 DIAGNOSIS — I5033 Acute on chronic diastolic (congestive) heart failure: Secondary | ICD-10-CM | POA: Diagnosis present

## 2018-08-17 DIAGNOSIS — I4891 Unspecified atrial fibrillation: Secondary | ICD-10-CM | POA: Diagnosis not present

## 2018-08-17 DIAGNOSIS — R0602 Shortness of breath: Secondary | ICD-10-CM | POA: Diagnosis not present

## 2018-08-17 DIAGNOSIS — I509 Heart failure, unspecified: Secondary | ICD-10-CM | POA: Diagnosis not present

## 2018-08-17 DIAGNOSIS — Z811 Family history of alcohol abuse and dependence: Secondary | ICD-10-CM

## 2018-08-17 DIAGNOSIS — F32A Depression, unspecified: Secondary | ICD-10-CM | POA: Diagnosis present

## 2018-08-17 DIAGNOSIS — J810 Acute pulmonary edema: Secondary | ICD-10-CM | POA: Diagnosis not present

## 2018-08-17 DIAGNOSIS — E66813 Obesity, class 3: Secondary | ICD-10-CM | POA: Diagnosis present

## 2018-08-17 DIAGNOSIS — Z9889 Other specified postprocedural states: Secondary | ICD-10-CM | POA: Diagnosis not present

## 2018-08-17 DIAGNOSIS — J189 Pneumonia, unspecified organism: Secondary | ICD-10-CM | POA: Diagnosis present

## 2018-08-17 DIAGNOSIS — I160 Hypertensive urgency: Secondary | ICD-10-CM | POA: Diagnosis not present

## 2018-08-17 DIAGNOSIS — Z87891 Personal history of nicotine dependence: Secondary | ICD-10-CM

## 2018-08-17 LAB — CBC WITH DIFFERENTIAL/PLATELET
Abs Immature Granulocytes: 0.02 10*3/uL (ref 0.00–0.07)
Basophils Absolute: 0 10*3/uL (ref 0.0–0.1)
Basophils Relative: 1 %
Eosinophils Absolute: 0.3 10*3/uL (ref 0.0–0.5)
Eosinophils Relative: 6 %
HCT: 30.6 % — ABNORMAL LOW (ref 39.0–52.0)
Hemoglobin: 9.1 g/dL — ABNORMAL LOW (ref 13.0–17.0)
Immature Granulocytes: 0 %
Lymphocytes Relative: 20 %
Lymphs Abs: 1.1 10*3/uL (ref 0.7–4.0)
MCH: 27.2 pg (ref 26.0–34.0)
MCHC: 29.7 g/dL — ABNORMAL LOW (ref 30.0–36.0)
MCV: 91.6 fL (ref 80.0–100.0)
Monocytes Absolute: 0.7 10*3/uL (ref 0.1–1.0)
Monocytes Relative: 13 %
Neutro Abs: 3.4 10*3/uL (ref 1.7–7.7)
Neutrophils Relative %: 60 %
Platelets: 250 10*3/uL (ref 150–400)
RBC: 3.34 MIL/uL — ABNORMAL LOW (ref 4.22–5.81)
RDW: 17 % — ABNORMAL HIGH (ref 11.5–15.5)
WBC: 5.6 10*3/uL (ref 4.0–10.5)
nRBC: 0 % (ref 0.0–0.2)

## 2018-08-17 LAB — LACTATE DEHYDROGENASE: LDH: 146 U/L (ref 98–192)

## 2018-08-17 LAB — LACTATE DEHYDROGENASE, PLEURAL OR PERITONEAL FLUID: LD, Fluid: 470 U/L — ABNORMAL HIGH (ref 3–23)

## 2018-08-17 LAB — POC OCCULT BLOOD, ED: Fecal Occult Bld: POSITIVE — AB

## 2018-08-17 LAB — TYPE AND SCREEN
ABO/RH(D): A POS
Antibody Screen: POSITIVE

## 2018-08-17 LAB — URINALYSIS, ROUTINE W REFLEX MICROSCOPIC
Bacteria, UA: NONE SEEN
Bilirubin Urine: NEGATIVE
Glucose, UA: NEGATIVE mg/dL
Hgb urine dipstick: NEGATIVE
Ketones, ur: NEGATIVE mg/dL
Leukocytes, UA: NEGATIVE
Nitrite: NEGATIVE
Protein, ur: NEGATIVE mg/dL
Specific Gravity, Urine: 1.011 (ref 1.005–1.030)
pH: 6 (ref 5.0–8.0)

## 2018-08-17 LAB — COMPREHENSIVE METABOLIC PANEL
ALT: 8 U/L (ref 0–44)
AST: 14 U/L — ABNORMAL LOW (ref 15–41)
Albumin: 2.8 g/dL — ABNORMAL LOW (ref 3.5–5.0)
Alkaline Phosphatase: 64 U/L (ref 38–126)
Anion gap: 8 (ref 5–15)
BUN: 17 mg/dL (ref 8–23)
CO2: 26 mmol/L (ref 22–32)
Calcium: 9 mg/dL (ref 8.9–10.3)
Chloride: 106 mmol/L (ref 98–111)
Creatinine, Ser: 1.05 mg/dL (ref 0.61–1.24)
GFR calc Af Amer: >60 mL/min (ref >60)
GFR calc non Af Amer: >60 mL/min (ref >60)
Glucose, Bld: 111 mg/dL — ABNORMAL HIGH (ref 70–99)
Potassium: 3.7 mmol/L (ref 3.5–5.1)
Sodium: 140 mmol/L (ref 135–145)
Total Bilirubin: 0.3 mg/dL (ref 0.3–1.2)
Total Protein: 6.1 g/dL — ABNORMAL LOW (ref 6.5–8.1)

## 2018-08-17 LAB — BODY FLUID CELL COUNT WITH DIFFERENTIAL
Eos, Fluid: 17 %
Lymphs, Fluid: 16 %
Monocyte-Macrophage-Serous Fluid: 26 % — ABNORMAL LOW (ref 50–90)
Neutrophil Count, Fluid: 41 % — ABNORMAL HIGH (ref 0–25)
Total Nucleated Cell Count, Fluid: 2815 cu mm — ABNORMAL HIGH (ref 0–1000)

## 2018-08-17 LAB — TROPONIN I: Troponin I: <0.03 ng/mL (ref <0.03)

## 2018-08-17 LAB — MAGNESIUM: Magnesium: 2 mg/dL (ref 1.7–2.4)

## 2018-08-17 LAB — MRSA PCR SCREENING: MRSA by PCR: NEGATIVE

## 2018-08-17 LAB — BRAIN NATRIURETIC PEPTIDE: B Natriuretic Peptide: 373.9 pg/mL — ABNORMAL HIGH (ref 0.0–100.0)

## 2018-08-17 LAB — PROTEIN, PLEURAL OR PERITONEAL FLUID: Total protein, fluid: <3 g/dL

## 2018-08-17 LAB — I-STAT CG4 LACTIC ACID, ED: Lactic Acid, Venous: 0.83 mmol/L (ref 0.5–1.9)

## 2018-08-17 LAB — PROTIME-INR
INR: 1.28
Prothrombin Time: 15.8 seconds — ABNORMAL HIGH (ref 11.4–15.2)

## 2018-08-17 MED ORDER — ESCITALOPRAM OXALATE 10 MG PO TABS: 10.0000 mg | ORAL_TABLET | Freq: Every day | ORAL | Status: DC

## 2018-08-17 MED ORDER — DILTIAZEM LOAD VIA INFUSION
15.0000 mg | Freq: Once | INTRAVENOUS | Status: AC
  Administered 2018-08-17: 15 mg via INTRAVENOUS
  Filled 2018-08-17: qty 15

## 2018-08-17 MED ORDER — VITAMIN B-1 100 MG PO TABS
100.0000 mg | ORAL_TABLET | Freq: Every day | ORAL | Status: DC
  Administered 2018-08-17 – 2018-09-02 (×17): 100 mg via ORAL
  Filled 2018-08-17 (×17): qty 1

## 2018-08-17 MED ORDER — APIXABAN 5 MG PO TABS
5.0000 mg | ORAL_TABLET | Freq: Two times a day (BID) | ORAL | Status: DC
  Administered 2018-08-17 – 2018-09-02 (×32): 5 mg via ORAL
  Filled 2018-08-17 (×33): qty 1

## 2018-08-17 MED ORDER — MENTHOL 3 MG MT LOZG: 1.0000 | LOZENGE | OROMUCOSAL | Status: DC | PRN

## 2018-08-17 MED ORDER — IOPAMIDOL (ISOVUE-370) INJECTION 76%
INTRAVENOUS | Status: AC
  Filled 2018-08-17: qty 100

## 2018-08-17 MED ORDER — DILTIAZEM HCL 25 MG/5ML IV SOLN
10.0000 mg | Freq: Once | INTRAVENOUS | Status: AC
  Administered 2018-08-17: 10 mg via INTRAVENOUS
  Filled 2018-08-17: qty 5

## 2018-08-17 MED ORDER — OXYCODONE HCL 5 MG PO TABS
5.0000 mg | ORAL_TABLET | Freq: Four times a day (QID) | ORAL | Status: DC | PRN
  Administered 2018-08-17 – 2018-08-22 (×14): 5 mg via ORAL
  Filled 2018-08-17 (×15): qty 1

## 2018-08-17 MED ORDER — CYCLOSPORINE 0.05 % OP EMUL
1.0000 [drp] | Freq: Two times a day (BID) | OPHTHALMIC | Status: DC
  Administered 2018-08-17 – 2018-09-02 (×30): 1 [drp] via OPHTHALMIC
  Filled 2018-08-17 (×33): qty 1

## 2018-08-17 MED ORDER — FUROSEMIDE 10 MG/ML IJ SOLN
40.0000 mg | Freq: Once | INTRAMUSCULAR | Status: AC
  Administered 2018-08-17: 40 mg via INTRAVENOUS
  Filled 2018-08-17: qty 4

## 2018-08-17 MED ORDER — ONDANSETRON HCL 4 MG PO TABS
4.0000 mg | ORAL_TABLET | Freq: Four times a day (QID) | ORAL | Status: DC | PRN
  Administered 2018-08-25: 4 mg via ORAL
  Filled 2018-08-17 (×3): qty 1

## 2018-08-17 MED ORDER — ACETAMINOPHEN 325 MG PO TABS
650.0000 mg | ORAL_TABLET | Freq: Four times a day (QID) | ORAL | Status: DC | PRN
  Administered 2018-08-17 – 2018-08-30 (×13): 650 mg via ORAL
  Filled 2018-08-17 (×13): qty 2

## 2018-08-17 MED ORDER — ALUM & MAG HYDROXIDE-SIMETH 200-200-20 MG/5ML PO SUSP
30.0000 mL | Freq: Once | ORAL | Status: AC
  Administered 2018-08-17: 30 mL via ORAL
  Filled 2018-08-17: qty 30

## 2018-08-17 MED ORDER — SODIUM CHLORIDE 0.9 % IV SOLN: 1.0000 g | INTRAVENOUS | Status: DC

## 2018-08-17 MED ORDER — LIDOCAINE HCL 1 % IJ SOLN
INTRAMUSCULAR | Status: AC
  Filled 2018-08-17: qty 20

## 2018-08-17 MED ORDER — ROPINIROLE HCL 0.25 MG PO TABS
0.2500 mg | ORAL_TABLET | Freq: Every day | ORAL | Status: DC
  Administered 2018-08-17 – 2018-09-01 (×16): 0.25 mg via ORAL
  Filled 2018-08-17 (×19): qty 1

## 2018-08-17 MED ORDER — SODIUM CHLORIDE 0.9% FLUSH
10.0000 mL | Freq: Two times a day (BID) | INTRAVENOUS | Status: DC
  Administered 2018-08-17 – 2018-09-01 (×12): 10 mL
  Administered 2018-09-02: 20 mL

## 2018-08-17 MED ORDER — POLYVINYL ALCOHOL 1.4 % OP SOLN: 1.0000 [drp] | Freq: Every day | OPHTHALMIC | Status: DC | PRN

## 2018-08-17 MED ORDER — CHLORHEXIDINE GLUCONATE CLOTH 2 % EX PADS
6.0000 | MEDICATED_PAD | Freq: Every day | CUTANEOUS | Status: DC
  Administered 2018-08-17 – 2018-08-18 (×2): 6 via TOPICAL

## 2018-08-17 MED ORDER — IPRATROPIUM-ALBUTEROL 0.5-2.5 (3) MG/3ML IN SOLN: 3.0000 mL | RESPIRATORY_TRACT | Status: DC | PRN

## 2018-08-17 MED ORDER — POLYETHYLENE GLYCOL 3350 17 G PO PACK: 17.0000 g | PACK | Freq: Every day | ORAL | Status: DC | PRN

## 2018-08-17 MED ORDER — SODIUM CHLORIDE 0.9 % IV SOLN
1.0000 g | INTRAVENOUS | Status: AC
  Administered 2018-08-17 – 2018-08-21 (×5): 1000 mg via INTRAVENOUS
  Filled 2018-08-17 (×5): qty 1

## 2018-08-17 MED ORDER — ADULT MULTIVITAMIN W/MINERALS CH
1.0000 | ORAL_TABLET | Freq: Every evening | ORAL | Status: DC
  Administered 2018-08-17 – 2018-09-02 (×16): 1 via ORAL
  Filled 2018-08-17 (×17): qty 1

## 2018-08-17 MED ORDER — CALCIUM CARBONATE ANTACID 500 MG PO CHEW
1000.0000 mg | CHEWABLE_TABLET | Freq: Three times a day (TID) | ORAL | Status: DC | PRN
  Administered 2018-08-21 – 2018-08-28 (×5): 1000 mg via ORAL
  Filled 2018-08-17 (×5): qty 5

## 2018-08-17 MED ORDER — IOPAMIDOL (ISOVUE-370) INJECTION 76%
100.0000 mL | Freq: Once | INTRAVENOUS | Status: AC | PRN
  Administered 2018-08-17: 100 mL via INTRAVENOUS

## 2018-08-17 MED ORDER — GABAPENTIN 100 MG PO CAPS
100.0000 mg | ORAL_CAPSULE | Freq: Three times a day (TID) | ORAL | Status: DC
  Administered 2018-08-17 – 2018-09-02 (×49): 100 mg via ORAL
  Filled 2018-08-17 (×49): qty 1

## 2018-08-17 MED ORDER — DILTIAZEM HCL 60 MG PO TABS
120.0000 mg | ORAL_TABLET | Freq: Once | ORAL | Status: DC
  Filled 2018-08-17: qty 2

## 2018-08-17 MED ORDER — MIRTAZAPINE 7.5 MG PO TABS: 7.5000 mg | ORAL_TABLET | Freq: Every day | ORAL | Status: DC

## 2018-08-17 MED ORDER — SODIUM CHLORIDE 0.9% FLUSH
10.0000 mL | INTRAVENOUS | Status: DC | PRN
  Administered 2018-08-25: 30 mL
  Administered 2018-08-30: 20 mL
  Administered 2018-08-31: 10 mL
  Filled 2018-08-17 (×3): qty 40

## 2018-08-17 MED ORDER — DILTIAZEM HCL 25 MG/5ML IV SOLN
10.0000 mg | Freq: Once | INTRAVENOUS | Status: DC
  Filled 2018-08-17: qty 5

## 2018-08-17 MED ORDER — SODIUM CHLORIDE (PF) 0.9 % IJ SOLN
INTRAMUSCULAR | Status: AC
  Filled 2018-08-17: qty 50

## 2018-08-17 MED ORDER — PANTOPRAZOLE SODIUM 40 MG PO TBEC
40.0000 mg | DELAYED_RELEASE_TABLET | Freq: Every day | ORAL | Status: DC
  Administered 2018-08-17 – 2018-09-02 (×17): 40 mg via ORAL
  Filled 2018-08-17 (×17): qty 1

## 2018-08-17 MED ORDER — METOPROLOL TARTRATE 25 MG PO TABS
25.0000 mg | ORAL_TABLET | Freq: Two times a day (BID) | ORAL | Status: DC
  Administered 2018-08-17: 25 mg via ORAL
  Filled 2018-08-17: qty 1

## 2018-08-17 MED ORDER — ACETAMINOPHEN 325 MG PO TABS: 650.0000 mg | ORAL_TABLET | Freq: Four times a day (QID) | ORAL | Status: DC | PRN

## 2018-08-17 MED ORDER — ONDANSETRON HCL 4 MG/2ML IJ SOLN
4.0000 mg | Freq: Four times a day (QID) | INTRAMUSCULAR | Status: DC | PRN
  Administered 2018-08-21 – 2018-08-31 (×4): 4 mg via INTRAVENOUS
  Filled 2018-08-17 (×4): qty 2

## 2018-08-17 MED ORDER — HYDROXYZINE HCL 25 MG PO TABS
25.0000 mg | ORAL_TABLET | Freq: Three times a day (TID) | ORAL | Status: DC | PRN
  Administered 2018-08-17 – 2018-09-02 (×7): 25 mg via ORAL
  Filled 2018-08-17 (×8): qty 1

## 2018-08-17 MED ORDER — ACETAMINOPHEN 650 MG RE SUPP: 650.0000 mg | Freq: Four times a day (QID) | RECTAL | Status: DC | PRN

## 2018-08-17 MED ORDER — QUETIAPINE FUMARATE 50 MG PO TABS: 75.0000 mg | ORAL_TABLET | Freq: Every day | ORAL | Status: DC

## 2018-08-17 MED ORDER — DILTIAZEM HCL-DEXTROSE 100-5 MG/100ML-% IV SOLN (PREMIX)
5.0000 mg/h | INTRAVENOUS | Status: DC
  Administered 2018-08-17: 12.5 mg/h via INTRAVENOUS
  Administered 2018-08-17: 5 mg/h via INTRAVENOUS
  Filled 2018-08-17 (×2): qty 100

## 2018-08-17 MED ORDER — FERROUS SULFATE 325 (65 FE) MG PO TABS
325.0000 mg | ORAL_TABLET | Freq: Two times a day (BID) | ORAL | Status: DC
  Administered 2018-08-17 – 2018-08-25 (×17): 325 mg via ORAL
  Filled 2018-08-17 (×18): qty 1

## 2018-08-17 MED ORDER — SODIUM CHLORIDE 0.9 % IV BOLUS: 1000.0000 mL | Freq: Once | INTRAVENOUS | Status: DC

## 2018-08-17 MED ORDER — SENNOSIDES-DOCUSATE SODIUM 8.6-50 MG PO TABS
2.0000 | ORAL_TABLET | Freq: Two times a day (BID) | ORAL | Status: DC
  Administered 2018-08-18 – 2018-09-02 (×20): 2 via ORAL
  Filled 2018-08-17 (×29): qty 2

## 2018-08-17 MED ORDER — MELATONIN 3 MG PO TABS
6.0000 mg | ORAL_TABLET | Freq: Every day | ORAL | Status: DC
  Administered 2018-08-17 – 2018-09-01 (×16): 6 mg via ORAL
  Filled 2018-08-17 (×19): qty 2

## 2018-08-17 MED ORDER — VITAMIN D 25 MCG (1000 UNIT) PO TABS
2000.0000 [IU] | ORAL_TABLET | Freq: Every evening | ORAL | Status: DC
  Administered 2018-08-17 – 2018-09-02 (×17): 2000 [IU] via ORAL
  Filled 2018-08-17 (×18): qty 2

## 2018-08-17 NOTE — ED Provider Notes (Signed)
[place] DEPT Provider Note   CSN: [id number] Arrival date & time: 08/17/18  1013     History   Chief Complaint Chief Complaint  Patient presents with  . Abnormal Lab    HPI [name] is a 72 y.o. male.  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  The patient states that for the last week, he has felt incredibly fatigued.  He has been essentially unable to participate in PT.  He has begun to develop a cough, and right-sided pain that is worse with inspiration.  He states he had shortness of breath.  He reportedly had a chest x-ray and was just told today that he has had a pneumonia.  He also states that he has been more pale than usual, though he has not noticed any dark black or blood in stools.  No emesis.  No known fevers.  He notes that he has missed his dose of antibiotics today, and is due for IV ertapenem.  He only has several more doses left.  Denies any pain other than his right upper chest at this time.  He has been taking his blood thinner.  Past Medical History:  Diagnosis Date  . Anginal pain ([place]) 2006   evaluated by cardio  . Arthritis    knees,feet,shoulders,elbows.hands  . Constipation   . Dyspnea    with exertion   . Dysrhythmia    Atrial Flutter- 2006- corredted itself  . Family history of adverse reaction to anesthesia    mother- with novocaine went into shock  . GERD (gastroesophageal reflux disease)   . Headache   . Hemorrhoids   . History of blood transfusion   . Hypertension   . Insomnia   . Sleep apnea    cpap  . 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 Delirium   . Depression   . OSA (obstructive sleep apnea)   . Obesity, Class III, BMI 40-49.9 (morbid obesity) ([place])    . HCAP (healthcare-associated pneumonia) 04/16/2018  . Atrial fibrillation with RVR ([place]) 04/16/2018  . Morbid obesity with BMI of 50.0-59.9, adult ([place]) 04/16/2018  . Acute kidney injury ([place]) 04/16/2018  . Visual hallucination 04/16/2018  . SIRS (systemic inflammatory response syndrome) ([place]) 04/16/2018  . Anemia of chronic disease 04/16/2018  . Left hip postoperative wound infection 03/15/2018  . 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 Femur fracture, left ([place]) 02/15/2018  . Anemia 02/15/2018  . Femur fracture ([place]) 02/15/2018  . Temporal giant cell arteritis ([place]) 12/30/2017  . SOB (shortness of breath) 03/31/2014  . Hypertension   . Hyponatremia 03/18/2012  . 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[place]) 03/17/2012    Past Surgical History:  Procedure Laterality Date  . APPENDECTOMY    . ARTERY BIOPSY Left 12/30/2017   Procedure: BIOPSY TEMPORAL ARTERY;  Surgeon: Fanny Skates, MD;  Location: [facility];  Service: General;  Laterality: Left;  . CHOLECYSTECTOMY    . COLONOSCOPY W/ POLYPECTOMY    . ESOPHAGOGASTRODUODENOSCOPY (EGD) WITH PROPOFOL  07/06/2012   Procedure: ESOPHAGOGASTRODUODENOSCOPY (EGD) WITH PROPOFOL;  Surgeon: Jeryl Columbia, MD;  Location: [facility];  Service: Endoscopy;  Laterality: N/A;  . ESOPHAGOSCOPY    . EYE SURGERY     left eye- muscle repair  .  HERNIA REPAIR     ventral hernia  . INCISION AND DRAINAGE HIP Left 03/17/2018   Procedure: IRRIGATION AND DEBRIDEMENT LEFT THIGH WOUND;  Surgeon: Gaynelle Arabian, MD;  Location: [facility];  Service: Orthopedics;  Laterality: Left;  . JOINT REPLACEMENT     bilateral hips.  Right broke and had to be re placed again  . MASS EXCISION N/A 03/02/2015   Procedure: EXCISION ABDOMINAL WALL MASS;  Surgeon: Alphonsa Overall, MD;  Location: [place];  Service: General;  Laterality: N/A;  . TOTAL HIP REVISION Left 02/17/2018   Procedure: Left total hip arthroplasty revision;  Surgeon: Gaynelle Arabian, MD;  Location:  [facility];  Service: Orthopedics;  Laterality: Left;  Marland Kitchen VERTICAL BANDED GASTROPLASTY          Home Medications    Prior to Admission medications   Medication Sig Start Date End Date Taking? Authorizing Provider  acetaminophen (TYLENOL) 325 MG tablet Take 2 tablets (650 mg total) by mouth every 6 (six) hours as needed for mild pain, fever or headache. 04/27/18  Yes Barton Dubois, MD  apixaban (ELIQUIS) 5 MG TABS tablet Take 5 mg by mouth 2 (two) times daily.   Yes [provider]  calcium carbonate (TUMS - DOSED IN MG ELEMENTAL CALCIUM) 500 MG chewable tablet Chew 1,000 mg by mouth 3 (three) times daily as needed for indigestion.   Yes [provider]  carboxymethylcellulose (REFRESH TEARS) 0.5 % SOLN Place 1 drop into both eyes daily as needed (dry eye/irritation).  07/14/18  Yes [provider]  Cholecalciferol (VITAMIN D3) 2000 units TABS Take 2,000 Units by mouth every evening.    Yes [provider]  cycloSPORINE (RESTASIS) 0.05 % ophthalmic emulsion Place 1 drop into both eyes 2 (two) times daily.    Yes [provider]  diltiazem (DILACOR XR) 120 MG 24 hr capsule Take 240 mg by mouth daily.   Yes [provider]  DM-Benzocaine-Menthol (CHLORASEPTIC TOTAL) 01-04-09 MG LOZG Use as directed 1 lozenge in the mouth or throat every 2 (two) hours as needed (sore throat).   Yes [provider]  ertapenem 1 g in sodium chloride 0.9 % 100 mL Inject 1 g into the vein daily. 08/12/18 08/21/18 Yes [provider]  escitalopram (LEXAPRO) 10 MG tablet Take 10 mg by mouth daily.   Yes [provider]  ferrous sulfate 325 (65 FE) MG tablet Take 325 mg by mouth 2 (two) times daily with a meal.   Yes [provider]  furosemide (LASIX) 20 MG tablet Take 20 mg by mouth daily.   Yes [provider]  gabapentin (NEURONTIN) 100 MG capsule Take 100 mg by mouth 3 (three) times daily.   Yes [provider]    hydrOXYzine (ATARAX/VISTARIL) 25 MG tablet Take 25 mg by mouth 3 (three) times daily as needed for itching or anxiety. 07/14/18  Yes [provider]  ipratropium-albuterol (DUONEB) 0.5-2.5 (3) MG/3ML SOLN Inhale 3 mLs into the lungs every 4 (four) hours as needed for wheezing or shortness of breath. 07/14/18  Yes [provider]  Lidocaine 4 % PTCH Place 3 patches onto the skin See admin instructions. Place 3 patches to left hip & thigh region every morning & remove after 3 hours. 07/15/18  Yes [provider]  Melatonin 3 MG TABS Take 6 mg by mouth at bedtime.   Yes [provider]  metoprolol tartrate (LOPRESSOR) 25 MG tablet Take 25 mg by mouth 2 (two) times daily. 07/14/18  Yes [provider]  mirtazapine (REMERON) 7.5 MG tablet Take 7.5 mg by mouth at bedtime. 07/14/18  Yes [provider]  Multiple Vitamins-Minerals (CENTRUM ADULTS PO) Take 1 tablet by mouth every evening.    Yes [provider]  nystatin (MYCOSTATIN/NYSTOP) powder Apply 1 g topically daily as needed (yeast). Apply to bilateral groin as needed daily for yeast   Yes [provider]  oxyCODONE (OXY IR/ROXICODONE) 5 MG immediate release tablet Take 1 tablet (5 mg total) by mouth every 6 (six) hours as needed for moderate pain, severe pain or breakthrough pain. 04/27/18  Yes Barton Dubois, MD  oxyCODONE (OXY IR/ROXICODONE) 5 MG immediate release tablet Take 10 mg by mouth every 6 (six) hours as needed for pain. For 14 days 08/10/18 08/22/18 Yes [provider]  pantoprazole (PROTONIX) 40 MG tablet Take 1 tablet (40 mg total) by mouth daily. 04/28/18  Yes Barton Dubois, MD  polyethylene glycol [facility] / Floria Raveling) packet Take 17 g by mouth daily as needed for mild constipation. Patient taking differently: Take 17 g by mouth daily.  02/23/18  Yes Arrien, Jimmy Picket, MD  QUEtiapine (SEROQUEL) 25 MG tablet Take 75 mg by mouth at bedtime.   Yes  [provider]  rOPINIRole (REQUIP) 0.25 MG tablet Take 0.25 mg by mouth at bedtime.   Yes [provider]  senna-docusate (SENOKOT-S) 8.6-50 MG tablet Take 2 tablets by mouth 2 (two) times daily. 07/14/18  Yes [provider]  thiamine 100 MG tablet Take 100 mg by mouth daily. 07/15/18  Yes [provider]    Family History Family History  Problem Relation Age of Onset  . Cancer Mother   . Alcohol abuse Father     Social History Social History   Tobacco Use  . Smoking status: Former Smoker    Packs/day: 2.00    Years: 10.00    Pack years: 20.00    Types: Cigarettes    Start date: 05/16/1966    Last attempt to quit: 03/11/1984    Years since quitting: 34.4  . Smokeless tobacco: Never Used  Substance Use Topics  . Alcohol use: Yes    Alcohol/week: 0.0 standard drinks    Comment: occasionally a couple of times a year   . Drug use: No     Allergies   Bee venom and Nickel   Review of Systems Review of Systems  Constitutional: Positive for fatigue. Negative for chills and fever.  HENT: Negative for congestion and rhinorrhea.   Eyes: Negative for visual disturbance.  Respiratory: Positive for cough and shortness of breath. Negative for wheezing.   Cardiovascular: Negative for chest pain and leg swelling.  Gastrointestinal: Negative for abdominal pain, diarrhea, nausea and vomiting.  Genitourinary: Negative for dysuria and flank pain.  Musculoskeletal: Negative for neck pain and neck stiffness.  Skin: Negative for rash and wound.  Allergic/Immunologic: Negative for immunocompromised state.  Neurological: Positive for weakness. Negative for syncope and headaches.  All other systems reviewed and are negative.    Physical Exam Updated Vital Signs BP (!) 163/127 (BP Location: Left Arm)   Pulse (!) 117   Temp 98 F (36.7 C) (Oral)   Resp 19   Ht 5\' 8"  (1.727 m)   Wt (!) 140.6 kg   SpO2 96%   BMI 47.14 kg/m   Physical  Exam Vitals signs and nursing note reviewed.  Constitutional:      General: He is not in acute distress.    Appearance: He  is well-developed.  HENT:     Head: Normocephalic and atraumatic.  Eyes:     Conjunctiva/sclera: Conjunctivae normal.  Neck:     Musculoskeletal: Neck supple.  Cardiovascular:     Rate and Rhythm: Normal rate and regular rhythm.     Heart sounds: Normal heart sounds. No murmur. No friction rub.  Pulmonary:     Effort: Pulmonary effort is normal. No respiratory distress.     Breath sounds: Rhonchi and rales present. No wheezing.  Abdominal:     General: There is no distension.  Skin:    General: Skin is warm.     Capillary Refill: Capillary refill takes less than 2 seconds.     Coloration: Skin is pale.  Neurological:     Mental Status: He is alert and oriented to person, place, and time.     Motor: No abnormal muscle tone.      ED Treatments / Results  Labs (all labs ordered are listed, but only abnormal results are displayed) Labs Reviewed  CBC WITH DIFFERENTIAL/PLATELET - Abnormal; Notable for the following components:      Result Value   RBC 3.34 (*)    Hemoglobin 9.1 (*)    HCT 30.6 (*)    MCHC 29.7 (*)    RDW 17.0 (*)    All other components within normal limits  PROTIME-INR - Abnormal; Notable for the following components:   Prothrombin Time 15.8 (*)    All other components within normal limits  COMPREHENSIVE METABOLIC PANEL - Abnormal; Notable for the following components:   Glucose, Bld 111 (*)    Total Protein 6.1 (*)    Albumin 2.8 (*)    AST 14 (*)    All other components within normal limits  BRAIN NATRIURETIC PEPTIDE - Abnormal; Notable for the following components:   B Natriuretic Peptide 373.9 (*)    All other components within normal limits  POC OCCULT BLOOD, ED - Abnormal; Notable for the following components:   Fecal Occult Bld POSITIVE (*)    All other components within normal limits  CULTURE, BLOOD (ROUTINE X 2)   CULTURE, BLOOD (ROUTINE X 2)  URINALYSIS, ROUTINE W REFLEX MICROSCOPIC  TROPONIN I  I-STAT CG4 LACTIC ACID, ED  I-STAT CG4 LACTIC ACID, ED  TYPE AND SCREEN    EKG None  Radiology Ct Angio Chest Pe W And/or Wo Contrast  Result Date: 08/17/2018 CLINICAL DATA:  Low hemoglobin with pneumonia. Dyspnea and fever. EXAM: CT ANGIOGRAPHY CHEST WITH CONTRAST TECHNIQUE: Multidetector CT imaging of the chest was performed using the standard protocol during bolus administration of intravenous contrast. Multiplanar CT image reconstructions and MIPs were obtained to evaluate the vascular anatomy. CONTRAST:  145mL ISOVUE-370 IOPAMIDOL (ISOVUE-370) INJECTION 76% COMPARISON:  08/17/2018 CXR, chest CT 06/17/2018 FINDINGS: Cardiovascular: Conventional branch pattern of the great vessels with minimal atherosclerosis at the origin of the left subclavian. No aortic aneurysm or dissection. Mild aortic atherosclerosis without aneurysm or dissection. Scattered coronary arteriosclerosis. Satisfactory opacification of the pulmonary arteries to the proximal segmental level without pulmonary embolus. Heart size is mildly enlarged without pericardial effusion or thickening. Mediastinum/Nodes: Patent trachea and mainstem bronchi. No thyroid mass. The CT appearance of the esophagus is unremarkable. No axillary, supraclavicular, mediastinal nor hilar adenopathy. Lungs/Pleura: New moderate to large right pleural effusion with trace to small left pleural effusion. Adjacent bibasilar and compressive atelectasis is noted. Atelectasis and/or scarring is also seen in the lingula. Respiratory motion artifact slightly limit assessment of the lung bases. No  pneumothorax. No dominant mass. There appear to be scattered calcified pleural plaque along the periphery of the left hemithorax. Upper Abdomen: Cholecystectomy. 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 Increased fecal retention within the included colon. Musculoskeletal:  Spondylosis of the dorsal spine. No acute nor aggressive osseous lesions. Review of the MIP images confirms the above findings. IMPRESSION: 1. No acute pulmonary embolus, aortic aneurysm or dissection. 2. New moderate to large right pleural effusion with trace to small left pleural effusion. There is adjacent compressive and subsegmental atelectasis at each lung base and lingula. Aortic Atherosclerosis (ICD10-I70.0). Electronically Signed   By: Ashley Royalty M.D.   On: 08/17/2018 14:34   Dg Chest Portable 1 View  Result Date: 08/17/2018 CLINICAL DATA:  Right-sided chest pain EXAM: PORTABLE CHEST 1 VIEW COMPARISON:  CT chest 06/17/2018 FINDINGS: Small left pleural effusion. Bilateral mild interstitial thickening no focal consolidation or pneumothorax. Stable cardiomegaly. Left-sided PICC line with the tip projecting over the SVC. Moderate osteoarthritis of the right shoulder. IMPRESSION: 1. Cardiomegaly with mild pulmonary vascular congestion. Electronically Signed   By: Kathreen Devoid   On: 08/17/2018 11:44    Procedures .Suture Removal Date/Time: 08/17/2018 11:04 AM Performed by: Duffy Bruce, MD Authorized by: Duffy Bruce, MD   Consent:    Consent obtained:  Verbal   Consent given by:  Patient   Risks discussed:  Bleeding, pain and wound separation   Alternatives discussed:  Referral Location:    Location: Left hip. Procedure details:    Wound appearance:  Red   Number of sutures removed:  1 (Long, cutaneous suture removed (running)) Post-procedure details:    Post-removal:  Antibiotic ointment applied and dressing applied   Patient tolerance of procedure:  Tolerated well, no immediate complications  .Critical Care Performed by: Duffy Bruce, MD Authorized by: Duffy Bruce, MD   Critical care provider statement:    Critical care time (minutes):  35   Critical care time was exclusive of:  Separately billable procedures and treating other patients and teaching time    Critical care was necessary to treat or prevent imminent or life-threatening deterioration of the following conditions:  Cardiac failure, circulatory failure and respiratory failure   Critical care was time spent personally by me on the following activities:  Development of treatment plan with patient or surrogate, discussions with consultants, evaluation of patient's response to treatment, examination of patient, obtaining history from patient or surrogate, ordering and performing treatments and interventions, ordering and review of laboratory studies, ordering and review of radiographic studies, pulse oximetry, re-evaluation of patient's condition and review of old charts   I assumed direction of critical care for this patient from another provider in my specialty: no     (including critical care time)  Medications Ordered in ED Medications  ertapenem (INVANZ) 1,000 mg in sodium chloride 0.9 % 100 mL IVPB ( Intravenous Stopped 08/17/18 1238)  sodium chloride (PF) 0.9 % injection (has no administration in time range)  iopamidol (ISOVUE-370) 76 % injection (has no administration in time range)  furosemide (LASIX) injection 40 mg (has no administration in time range)  diltiazem (CARDIZEM) 1 mg/mL load via infusion 15 mg (has no administration in time range)    And  diltiazem (CARDIZEM) 100 mg in dextrose 5% 12mL (1 mg/mL) infusion (has no administration in time range)  diltiazem (CARDIZEM) injection 10 mg (10 mg Intravenous Given 08/17/18 1246)  iopamidol (ISOVUE-370) 76 % injection 100 mL (100 mLs Intravenous Contrast Given 08/17/18 1408)  Initial Impression / Assessment and Plan / ED Course  I have reviewed the triage vital signs and the nursing notes.  Pertinent labs & imaging results that were available during my care of the patient were reviewed by me and considered in my medical decision making (see chart for details).  Clinical Course as of Aug 17 1450  Tue Aug 17, 2018  1041 72  yo M with complex history as above here w/ anemia, weakness, and R-sided chest pain. Regarding his anemia, suspect slow GIB 2/2 eliquis use. No BRBPR. No other external signs of bleeding. BP normal. Will repeat CBC here, send T&S. Regarding his R sided CP, he has rales, rhonchi and exam and c/f PNA, which is worrisome in setting of IV Erta use. Will check CXR, give dose of erta that is due currently - likely add Vanc.   [CI]  1250 CXR shows mild edema but no focal PNA, though his history is concerning for this. Will check CT given his immobility, pleurisy, tachycardia. Labs o/w reassuring - Hgb 9.1 here, so unsure if the earlier lab was error. Trop neg. Awaiting CT.    [CI]  1449 CT scan shows large R>L effusions. BNP elevated. AFib also persists after dilt IV. Will start on dilt bolus and drip, admit for diuresis and drainage.   [CI]    Clinical Course User Index [CI] Duffy Bruce, MD    Final Clinical Impressions(s) / ED Diagnoses   Final diagnoses:  Pleural effusion  Acute on chronic congestive heart failure, unspecified heart failure type [facility])  Atrial fibrillation with rapid ventricular response [facility])    ED Discharge Orders    None       Duffy Bruce, MD 08/17/18 1451

## 2018-08-17 NOTE — ED Notes (Signed)
Bed: DP82 Expected date:  Expected time:  Means of arrival:  Comments: EMS low hgb 5.8

## 2018-08-17 NOTE — Procedures (Addendum)
Ultrasound-guided diagnostic and therapeutic right thoracentesis performed yielding 830 cc of hazy, amber fluid. No immediate complications. Follow-up chest x-ray pending. The fluid was sent to the lab for preordered studies.EBL<1cc.

## 2018-08-17 NOTE — ED Notes (Signed)
Patient transported to Ultrasound 

## 2018-08-17 NOTE — ED Triage Notes (Signed)
Patient BIB [facility] from [facility] rehab for low hemoglobin of 5.6 drawn yesterday. Pt at facility for hip replacement and secondary infection. Pt also has current pneumonia per facility. Has picc line in place and receiving iv antibiotics. Pt non ambulatory.

## 2018-08-17 NOTE — H&P (Addendum)
TRH H&P   Patient Demographics:    [name], is a 72 y.o. male  MRN: [id number]   DOB - 12/11/1945  Admit Date - 08/17/2018  Outpatient Primary MD for the patient is [name], MD  Referring MD:: Dr. Ellender Hose  Outpatient Specialists: ID at [facility]  Patient coming from: [facility]  Chief Complaint  Patient presents with  . Abnormal Lab      HPI:    [name]  is a 72 y.o. male, with history of hypertension, A. 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  He was then transferred to [facility] for continued care.  He was extubated at [facility] where he had prolonged stay with hypoactive delirium.  He also had a fall from bed injuring his left hip with x-ray showing new linear fracture of his prosthetic hip joint.  CT showed a large fluid-filled collection extending from the left greater trochanter to the lateral skin surface.  On 07/01/2018 he developed wound dehiscence (swab and abscess culture growing Bacteroides fragilis).  He had a drain placed by IR followed by I&D and revision of the femoral head with sharp excision of scar tissue and necrotic tissues on 11/5.  Cultures obtained from the OR both were positive for B fragilis.  Patient was discharged with plan on 6 weeks of ertapenem and then transition to long-term suppressive Flagyl due to retained hardware. Patient was discharged to [facility] where he has been participating with PT.  At baseline he is unable to ambulate by himself but walks short distance with the help of PT At the facility he was having problems with missing ertapenem doses due to lack of IV access. He was seen by  the ID physician at [facility] 4 days back who recommended completing ertapenem on 12/21, then remove his PICC line start Flagyl 500 mg twice daily indefinitely.  For the past week he has not been able to participate well with PT and feeling increasingly fatigued with cough, right-sided chest pain worsened with inspiration and increasing shortness of breath.  He reports having a chest x-ray done at the facility and was told he had a pneumonia and appeared pale.  Denies any fevers or chills, nausea, vomiting, dizziness, abdominal pain, diarrhea, dysuria, orthopnea or PND.  He had blood work done at the facility and reportedly hemoglobin was 5.6.  He was then sent to the ED.  Course in the ED Patient was afebrile.  He was found to be in rapid A. fib with heart rate in 120s-130s, tachypneic with stable blood pressure. He had diminished breath sounds on the right lung.  Blood work showed hemoglobin of 9.1 (baseline of 9-10), normal WBC, chemistry  was normal, INR of 1.28, glucose of 111.  NP of 373 and Hemoccult positive in the ED.  Chest x-ray showed cardiomegaly with pulmonary vascular congestion.  Given his tachypnea and rapid A. fib a CT angiogram of the chest was done which showed moderate to large right pleural effusion with small left pleural effusion.  Negative for PE, aneurysm or dissection. Patient sent for ultrasound thoracentesis with a 30 cc of clear fluid removed.  Sent for studies. Patient given 40 mg IV Lasix, 10 mg Cardizem push without improvement in his heart rate and started on Cardizem drip.  Hospitalist consulted for admission to stepdown unit.    Review of systems:    In addition to the HPI above, No Fever-chills, No Headache, No changes with Vision or hearing, No problems swallowing food or Liquids, Right-sided chest pain, nonproductive cough and shortness of breath+++ No Abdominal pain, No Nausea or vomiting, Bowel movements are regular, No Blood in stool or Urine, No  dysuria, No new skin rashes or bruises, No new joints pains-aches,  Generalized weakness, no tingling or numbness O recent weight gain or loss, No polyuria, polydypsia or polyphagia, No significant Mental Stressors.     With Past History of the following :    Past Medical History:  Diagnosis Date  . Anginal pain ([place]) 2006   evaluated by cardio  . Arthritis    knees,feet,shoulders,elbows.hands  . Constipation   . Dyspnea    with exertion   . Dysrhythmia    Atrial Flutter- 2006- corredted itself  . Family history of adverse reaction to anesthesia    mother- with novocaine went into shock  . GERD (gastroesophageal reflux disease)   . Headache   . Hemorrhoids   . History of blood transfusion   . Hypertension   . Insomnia   . Sleep apnea    cpap  . 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 APPENDECTOMY    . ARTERY BIOPSY Left 12/30/2017   Procedure: BIOPSY TEMPORAL ARTERY;  Surgeon: Fanny Skates, MD;  Location: [facility];  Service: General;  Laterality: Left;  . CHOLECYSTECTOMY    . COLONOSCOPY W/ POLYPECTOMY    . ESOPHAGOGASTRODUODENOSCOPY (EGD) WITH PROPOFOL  07/06/2012   Procedure: ESOPHAGOGASTRODUODENOSCOPY (EGD) WITH PROPOFOL;  Surgeon: Jeryl Columbia, MD;  Location: [facility];  Service: Endoscopy;  Laterality: N/A;  . ESOPHAGOSCOPY    . EYE SURGERY     left eye- muscle repair  . HERNIA REPAIR     ventral hernia  . INCISION AND DRAINAGE HIP Left 03/17/2018   Procedure: IRRIGATION AND DEBRIDEMENT LEFT THIGH WOUND;  Surgeon: Gaynelle Arabian, MD;  Location: [facility];  Service: Orthopedics;  Laterality: Left;  . JOINT REPLACEMENT     bilateral hips.  Right broke and had to be re placed again  . MASS EXCISION N/A 03/02/2015   Procedure: EXCISION ABDOMINAL WALL MASS;  Surgeon: Alphonsa Overall, MD;  Location: [facility];  Service: General;  Laterality: N/A;  . TOTAL HIP REVISION Left 02/17/2018   Procedure: Left total  hip arthroplasty revision;  Surgeon: Gaynelle Arabian, MD;  Location: [facility];  Service: Orthopedics;  Laterality: Left;  Marland Kitchen VERTICAL BANDED GASTROPLASTY        Social History:     Social History   Tobacco Use  . Smoking status: Former Smoker    Packs/day: 2.00    Years: 10.00    Pack years: 20.00  Types: Cigarettes    Start date: 05/16/1966    Last attempt to quit: 03/11/1984    Years since quitting: 34.4  . Smokeless tobacco: Never Used  Substance Use Topics  . Alcohol use: Yes    Alcohol/week: 0.0 standard drinks    Comment: occasionally a couple of times a year      Lives -currently at SNF  Mobility -ambulates with assistance only     Family History :     Family History  Problem Relation Age of Onset  . Cancer Mother   . Alcohol abuse Father       Home Medications:   Prior to Admission medications   Medication Sig Start Date End Date Taking? Authorizing Provider  acetaminophen (TYLENOL) 325 MG tablet Take 2 tablets (650 mg total) by mouth every 6 (six) hours as needed for mild pain, fever or headache. 04/27/18  Yes Barton Dubois, MD  apixaban (ELIQUIS) 5 MG TABS tablet Take 5 mg by mouth 2 (two) times daily.   Yes [provider]  calcium carbonate (TUMS - DOSED IN MG ELEMENTAL CALCIUM) 500 MG chewable tablet Chew 1,000 mg by mouth 3 (three) times daily as needed for indigestion.   Yes [provider]  carboxymethylcellulose (REFRESH TEARS) 0.5 % SOLN Place 1 drop into both eyes daily as needed (dry eye/irritation).  07/14/18  Yes [provider]  Cholecalciferol (VITAMIN D3) 2000 units TABS Take 2,000 Units by mouth every evening.    Yes [provider]  cycloSPORINE (RESTASIS) 0.05 % ophthalmic emulsion Place 1 drop into both eyes 2 (two) times daily.    Yes [provider]  diltiazem (DILACOR XR) 120 MG 24 hr capsule Take 240 mg by mouth daily.   Yes [provider]  DM-Benzocaine-Menthol (CHLORASEPTIC  TOTAL) 01-04-09 MG LOZG Use as directed 1 lozenge in the mouth or throat every 2 (two) hours as needed (sore throat).   Yes [provider]  ertapenem 1 g in sodium chloride 0.9 % 100 mL Inject 1 g into the vein daily. 08/12/18 08/21/18 Yes [provider]  escitalopram (LEXAPRO) 10 MG tablet Take 10 mg by mouth daily.   Yes [provider]  ferrous sulfate 325 (65 FE) MG tablet Take 325 mg by mouth 2 (two) times daily with a meal.   Yes [provider]  furosemide (LASIX) 20 MG tablet Take 20 mg by mouth daily.   Yes [provider]  gabapentin (NEURONTIN) 100 MG capsule Take 100 mg by mouth 3 (three) times daily.   Yes [provider]  hydrOXYzine (ATARAX/VISTARIL) 25 MG tablet Take 25 mg by mouth 3 (three) times daily as needed for itching or anxiety. 07/14/18  Yes [provider]  ipratropium-albuterol (DUONEB) 0.5-2.5 (3) MG/3ML SOLN Inhale 3 mLs into the lungs every 4 (four) hours as needed for wheezing or shortness of breath. 07/14/18  Yes [provider]  Lidocaine 4 % PTCH Place 3 patches onto the skin See admin instructions. Place 3 patches to left hip & thigh region every morning & remove after 3 hours. 07/15/18  Yes [provider]  Melatonin 3 MG TABS Take 6 mg by mouth at bedtime.   Yes [provider]  metoprolol tartrate (LOPRESSOR) 25 MG tablet Take 25 mg by mouth 2 (two) times daily. 07/14/18  Yes [provider]  mirtazapine (REMERON) 7.5 MG tablet Take 7.5 mg by mouth at bedtime. 07/14/18  Yes [provider]  Multiple Vitamins-Minerals (CENTRUM ADULTS  PO) Take 1 tablet by mouth every evening.    Yes [provider]  nystatin (MYCOSTATIN/NYSTOP) powder Apply 1 g topically daily as needed (yeast). Apply to bilateral groin as needed daily for yeast   Yes [provider]  oxyCODONE (OXY IR/ROXICODONE) 5 MG immediate release tablet Take 1 tablet (5 mg total) by  mouth every 6 (six) hours as needed for moderate pain, severe pain or breakthrough pain. 04/27/18  Yes Barton Dubois, MD  oxyCODONE (OXY IR/ROXICODONE) 5 MG immediate release tablet Take 10 mg by mouth every 6 (six) hours as needed for pain. For 14 days 08/10/18 08/22/18 Yes [provider]  pantoprazole (PROTONIX) 40 MG tablet Take 1 tablet (40 mg total) by mouth daily. 04/28/18  Yes Barton Dubois, MD  polyethylene glycol [facility] / Floria Raveling) packet Take 17 g by mouth daily as needed for mild constipation. Patient taking differently: Take 17 g by mouth daily.  02/23/18  Yes Arrien, Jimmy Picket, MD  QUEtiapine (SEROQUEL) 25 MG tablet Take 75 mg by mouth at bedtime.   Yes [provider]  rOPINIRole (REQUIP) 0.25 MG tablet Take 0.25 mg by mouth at bedtime.   Yes [provider]  senna-docusate (SENOKOT-S) 8.6-50 MG tablet Take 2 tablets by mouth 2 (two) times daily. 07/14/18  Yes [provider]  thiamine 100 MG tablet Take 100 mg by mouth daily. 07/15/18  Yes [provider]     Allergies:     Allergies  Allergen Reactions  . Bee Venom Swelling  . Nickel Rash     Physical Exam:   Vitals  Blood pressure (!) 163/127, pulse (!) 117, temperature 98 F (36.7 C), temperature source Oral, resp. rate 19, height 5\' 8"  (1.727 m), weight (!) 140.6 kg, SpO2 96 %.   Renal: Elderly obese male lying in bed, fatigued, not in acute distress HEENT: Pupils reactive bilaterally, EOMI, pallor present, no icterus, moist mucosa, supple neck, no JVD appreciated Chest: Right thoracentesis site appears clean, tender to pressure over the area, diminished breath sounds over right lung, no rhonchi or wheeze CVs: S1 and S2 tachycardic and irregular, no murmurs rub or gallop GI: Soft, nondistended, nontender, bowel sounds present Musculoskeletal: Left hip surgical site appears clean.  Sutures removed in the ED Trace edema bilaterally CNS: Alert and oriented,  nonfocal   Data Review:    CBC Recent Labs  Lab 08/17/18 1106  WBC 5.6  HGB 9.1*  HCT 30.6*  PLT 250  MCV 91.6  MCH 27.2  MCHC 29.7*  RDW 17.0*  LYMPHSABS 1.1  MONOABS 0.7  EOSABS 0.3  BASOSABS 0.0   ------------------------------------------------------------------------------------------------------------------  Chemistries  Recent Labs  Lab 08/17/18 1106  NA 140  K 3.7  CL 106  CO2 26  GLUCOSE 111*  BUN 17  CREATININE 1.05  CALCIUM 9.0  AST 14*  ALT 8  ALKPHOS 64  BILITOT 0.3   ------------------------------------------------------------------------------------------------------------------ estimated creatinine clearance is 87.5 mL/min (by C-G formula based on SCr of 1.05 mg/dL). ------------------------------------------------------------------------------------------------------------------ No results for input(s): TSH, T4TOTAL, T3FREE, THYROIDAB in the last 72 hours.  Invalid input(s): FREET3  Coagulation profile Recent Labs  Lab 08/17/18 1106  INR 1.28   ------------------------------------------------------------------------------------------------------------------- No results for input(s): DDIMER in the last 72 hours. -------------------------------------------------------------------------------------------------------------------  Cardiac Enzymes Recent Labs  Lab 08/17/18 1106  TROPONINI <0.03   ------------------------------------------------------------------------------------------------------------------    Component Value Date/Time   BNP 373.9 (H) 08/17/2018 1106     ---------------------------------------------------------------------------------------------------------------  Urinalysis    Component Value Date/Time  COLORURINE YELLOW 08/17/2018 1035   APPEARANCEUR CLEAR 08/17/2018 1035   APPEARANCEUR Clear 01/18/2018 1539   LABSPEC 1.011 08/17/2018 1035   PHURINE 6.0 08/17/2018 1035   GLUCOSEU NEGATIVE 08/17/2018 1035    HGBUR NEGATIVE 08/17/2018 [place] 08/17/2018 1035   BILIRUBINUR Negative 01/18/2018 1539   KETONESUR NEGATIVE 08/17/2018 1035   PROTEINUR NEGATIVE 08/17/2018 1035   UROBILINOGEN 0.2 09/06/2012 0213   NITRITE NEGATIVE 08/17/2018 1035   LEUKOCYTESUR NEGATIVE 08/17/2018 1035   LEUKOCYTESUR Negative 01/18/2018 1539    ----------------------------------------------------------------------------------------------------------------   Imaging Results:    Ct Angio Chest Pe W And/or Wo Contrast  Result Date: 08/17/2018 CLINICAL DATA:  Low hemoglobin with pneumonia. Dyspnea and fever. EXAM: CT ANGIOGRAPHY CHEST WITH CONTRAST TECHNIQUE: Multidetector CT imaging of the chest was performed using the standard protocol during bolus administration of intravenous contrast. Multiplanar CT image reconstructions and MIPs were obtained to evaluate the vascular anatomy. CONTRAST:  170mL ISOVUE-370 IOPAMIDOL (ISOVUE-370) INJECTION 76% COMPARISON:  08/17/2018 CXR, chest CT 06/17/2018 FINDINGS: Cardiovascular: Conventional branch pattern of the great vessels with minimal atherosclerosis at the origin of the left subclavian. No aortic aneurysm or dissection. Mild aortic atherosclerosis without aneurysm or dissection. Scattered coronary arteriosclerosis. Satisfactory opacification of the pulmonary arteries to the proximal segmental level without pulmonary embolus. Heart size is mildly enlarged without pericardial effusion or thickening. Mediastinum/Nodes: Patent trachea and mainstem bronchi. No thyroid mass. The CT appearance of the esophagus is unremarkable. No axillary, supraclavicular, mediastinal nor hilar adenopathy. Lungs/Pleura: New moderate to large right pleural effusion with trace to small left pleural effusion. Adjacent bibasilar and compressive atelectasis is noted. Atelectasis and/or scarring is also seen in the lingula. Respiratory motion artifact slightly limit assessment of the lung  bases. No pneumothorax. No dominant mass. There appear to be scattered calcified pleural plaque along the periphery of the left hemithorax. Upper Abdomen: Cholecystectomy. 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 Increased fecal retention within the included colon. Musculoskeletal: Spondylosis of the dorsal spine. No acute nor aggressive osseous lesions. Review of the MIP images confirms the above findings. IMPRESSION: 1. No acute pulmonary embolus, aortic aneurysm or dissection. 2. New moderate to large right pleural effusion with trace to small left pleural effusion. There is adjacent compressive and subsegmental atelectasis at each lung base and lingula. Aortic Atherosclerosis (ICD10-I70.0). Electronically Signed   By: Ashley Royalty M.D.   On: 08/17/2018 14:34   Dg Chest Portable 1 View  Result Date: 08/17/2018 CLINICAL DATA:  Right-sided chest pain EXAM: PORTABLE CHEST 1 VIEW COMPARISON:  CT chest 06/17/2018 FINDINGS: Small left pleural effusion. Bilateral mild interstitial thickening no focal consolidation or pneumothorax. Stable cardiomegaly. Left-sided PICC line with the tip projecting over the SVC. Moderate osteoarthritis of the right shoulder. IMPRESSION: 1. Cardiomegaly with mild pulmonary vascular congestion. Electronically Signed   By: Kathreen Devoid   On: 08/17/2018 11:44    My personal review of EKG: A. fib with RVR at 116, PVCs with QTC of 516.  Assessment & Plan:    Principal Problem:   Atrial fibrillation with RVR (HCC) Possibly associated with acute on chronic diastolic CHF.  No signs of infection. Patient was hospitalized few months back at any pain with rapid A. fib. Placed on IV Cardizem drip which will be continued and titrated. Admit to stepdown unit.  I will resume his beta-blocker and Eliquis. 2D echo 4 months back with EF of 50-55% with diffuse hypokinesis.  Will not repeat echo at this time.  Active Problems: Acute  on chronic diastolic CHF with right-sided  pleural effusion Right-sided thoracentesis with a 30 cc clear fluid removed.  Sent for cell count, LDH, protein and culture. We will place on Lasix 40 mg every 12 hours.  O2 via nasal cannula.  Strict I's/O and daily weight. Continue PRN nebs.    SIRS (systemic inflammatory response syndrome) (HCC) Secondary to right-sided pleural effusion and rapid A. fib.  Monitor in stepdown. Continue PRN nebs.  Blood culture sent from the ED.     Left hip postoperative wound infection Following with ID at [facility].  He will be on ertapenem until 12/21, has a PEG.  After that he needs to be started on Flagyl 500 mg twice daily indefinitely for suppressive therapy. PT evaluation  Anemia of chronic disease Hemoccult positive in the ED.  Hemoglobin at baseline.  Monitor closely.  Patient on Eliquis.    Depression Multiple home meds.  QTC is prolonged.  Will hold both Remeron and Seroquel    Obesity, Class III, BMI 40-49.9 (morbid obesity) (HCC)    OSA on CPAP Nighttime CPAP ordered.  GERD  Continue PPI  Prolonged QTC (516) Hold Remeron, Lexapro and Seroquel.  Monitor potassium and magnesium.  DVT Prophylaxis: Eliquis  AM Labs Ordered, also please review Full Orders  Family Communication: Admission, patients condition and plan of care including tests being ordered have been discussed with the patient and his friend at bedside   Code Status full code  Likely DC to return to SNF once clinically improved  Condition GUARDED  Consults called: None  Admission status: Inpatient  Patient presenting with A. fib with RVR, acute on chronic diastolic CHF with large right-sided pleural effusion requiring thoracentesis.  He needs to be monitored in stepdown on IV Cardizem drip and IV diuresis.Marland Kitchen  He needs to be monitored for at least >2 midnights as inpatient  Time spent in minutes : 70   Keyana Guevara M.D on 08/17/2018 at 3:39 PM  Between 7am to 7pm - Pager - [phone]. After 7pm go to  [email or web address] - password [facility]  [facility] - Office  [phone]

## 2018-08-18 LAB — CBC
HCT: 27.6 % — ABNORMAL LOW (ref 39.0–52.0)
Hemoglobin: 8.1 g/dL — ABNORMAL LOW (ref 13.0–17.0)
MCH: 26.8 pg (ref 26.0–34.0)
MCHC: 29.3 g/dL — ABNORMAL LOW (ref 30.0–36.0)
MCV: 91.4 fL (ref 80.0–100.0)
Platelets: 253 10*3/uL (ref 150–400)
RBC: 3.02 MIL/uL — ABNORMAL LOW (ref 4.22–5.81)
RDW: 17 % — ABNORMAL HIGH (ref 11.5–15.5)
WBC: 6.8 10*3/uL (ref 4.0–10.5)
nRBC: 0 % (ref 0.0–0.2)

## 2018-08-18 LAB — BASIC METABOLIC PANEL
Anion gap: 8 (ref 5–15)
BUN: 17 mg/dL (ref 8–23)
CO2: 28 mmol/L (ref 22–32)
Calcium: 8.4 mg/dL — ABNORMAL LOW (ref 8.9–10.3)
Chloride: 104 mmol/L (ref 98–111)
Creatinine, Ser: 1.02 mg/dL (ref 0.61–1.24)
GFR calc Af Amer: >60 mL/min (ref >60)
GFR calc non Af Amer: >60 mL/min (ref >60)
Glucose, Bld: 87 mg/dL (ref 70–99)
Potassium: 4 mmol/L (ref 3.5–5.1)
Sodium: 140 mmol/L (ref 135–145)

## 2018-08-18 MED ORDER — ESCITALOPRAM OXALATE 10 MG PO TABS
10.0000 mg | ORAL_TABLET | Freq: Every day | ORAL | Status: DC
  Administered 2018-08-18 – 2018-09-02 (×16): 10 mg via ORAL
  Filled 2018-08-18 (×16): qty 1

## 2018-08-18 MED ORDER — METOPROLOL TARTRATE 25 MG PO TABS
25.0000 mg | ORAL_TABLET | Freq: Three times a day (TID) | ORAL | Status: DC
  Administered 2018-08-18 – 2018-08-20 (×9): 25 mg via ORAL
  Filled 2018-08-18 (×9): qty 1

## 2018-08-18 NOTE — Progress Notes (Signed)
Patient Demographics:    [name], is a 72 y.o. male, DOB - [birth year], HQI:[id number]  Admit date - 08/17/2018   Admitting Physician Louellen Molder, MD  Outpatient Primary MD for the patient is [name], MD  LOS - 1   Chief Complaint  Patient presents with  . Abnormal Lab        Subjective:    [name] today has no fevers, no emesis,  No chest pain, shortness of breath is improving slowly after thoracentesis, tachycardia and palpitations persist,  Assessment  & Plan :    Principal Problem:   Atrial fibrillation with RVR (HCC) Active Problems:   Left hip postoperative wound infection   SIRS (systemic inflammatory response syndrome) (HCC)   Anemia of chronic disease   Depression   OSA (obstructive sleep apnea)   Obesity, Class III, BMI 40-49.9 (morbid obesity) (HCC)   Hypertensive urgency   Pleural effusion on left   Acute on chronic diastolic (congestive) heart failure (HCC)   OSA on CPAP  Brief Summary 72 y.o. male, with history of hypertension, A. 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 fib with RVR and also found to have right-sided pleural effusion, IR removed 830 mL of clear fluid   Plan:-  1) chronic atrial fibrillation with RVR--- heart rate 120s to 130s, we will increase metoprolol to 25 mg 3 times daily, will with an attempt to wean off IV Cardizem if rate control improved with increased dose of metoprolol, , last known EF was 50 to 55% , continue Eliquis for anticoagulation  2)HFpEF with right-sided pleural effusion--- patient with history of chronic diastolic dysfunction CHF suspect exacerbation secondary to A. fib with RVR, improved post thoracentesis on 08/17/2018 with removal  of 830 mL of clear fluid, continue Lasix 40 mg twice daily, daily weights and strict I's and O's, supplemental oxygen as needed  3)Lt Hip postoperative wound infection----patient usually follows with infectious disease at [facility], continue IV ertapenem through 08/21/2018  4) morbid obesity with OSA-- c/n CPAP nightly  5)DEpression----Lexapro, Seroquel and Remeron were held due to concerns about QT prolongation, will restart Lexapro at this time  6) chronic anemia----etiology unclear, stool is Hemoccult positive at this time but H&H is stable patient on Eliquis due to history of A. Fib , monitor H&H closely, continue Protonix   Disposition/Need for in-Hospital Stay- patient unable to be discharged at this time due to symptomatic A. fib with RVR requiring IV Cardizem as well as heme positive stools requiring monitoring for possible significant bleeding  Code Status : Full   Disposition Plan  : SNF Rehab  DVT Prophylaxis  : Eliquis  Lab Results  Component Value Date   PLT 253 08/18/2018    Inpatient Medications  Scheduled Meds: . apixaban  5 mg Oral BID  . Chlorhexidine Gluconate Cloth  6 each Topical Daily  . cholecalciferol  2,000 Units Oral QPM  . cycloSPORINE  1 drop Both Eyes BID  . ferrous sulfate  325 mg Oral BID WC  . gabapentin  100 mg Oral TID  . Melatonin  6 mg Oral QHS  . metoprolol tartrate  25 mg Oral TID  . multivitamin with minerals  1 tablet Oral QPM  . pantoprazole  40 mg Oral Daily  . rOPINIRole  0.25 mg Oral QHS  . senna-docusate  2 tablet Oral BID  . sodium chloride flush  10-40 mL Intracatheter Q12H  . thiamine  100 mg Oral Daily   Continuous Infusions: . diltiazem (CARDIZEM) infusion Stopped (08/18/18 0955)  . ertapenem Stopped (08/18/18 1306)   PRN Meds:.acetaminophen **OR** acetaminophen, calcium carbonate, hydrOXYzine, ipratropium-albuterol, menthol-cetylpyridinium, ondansetron **OR** ondansetron (ZOFRAN) IV, oxyCODONE, polyethylene  glycol, polyvinyl alcohol, sodium chloride flush    Anti-infectives (From admission, onward)   Start     Dose/Rate Route Frequency Ordered Stop   08/17/18 1730  ertapenem (INVANZ) 1,000 mg in sodium chloride 0.9 % 100 mL IVPB  Status:  Discontinued     1 g 200 mL/hr over 30 Minutes Intravenous Every 24 hours 08/17/18 1720 08/17/18 1728   08/17/18 1200  ertapenem (INVANZ) 1,000 mg in sodium chloride 0.9 % 100 mL IVPB     1 g 200 mL/hr over 30 Minutes Intravenous Every 24 hours 08/17/18 1036          Objective:   Vitals:   08/18/18 0900 08/18/18 0930 08/18/18 1100 08/18/18 1200  BP: 108/70 (!) 115/53 125/62 100/65  Pulse: (!) 109 (!) 102 (!) 103 91  Resp: 17 15 19 20   Temp:      TempSrc:      SpO2: 96% 97% 96% 94%  Weight:      Height:        Wt Readings from Last 3 Encounters:  08/18/18 (!) 139.7 kg  04/24/18 (!) 163.6 kg  03/17/18 (!) 155.6 kg     Intake/Output Summary (Last 24 hours) at 08/18/2018 1420 Last data filed at 08/18/2018 1000 Gross per 24 hour  Intake 144.44 ml  Output 800 ml  Net -655.56 ml     Physical Exam Patient is examined daily including today on 08/19/18 , exams remain the same as of yesterday except that has changed   Gen:- Awake Alert, to speak in sentences HEENT:- [place].AT, No sclera icterus Neck-Supple Neck,No JVD,.  Lungs-improved air movement on the right after thoracentesis  CV- S1, S2 normal, irregularly irregular heart rate was 120s earlier abd-  +ve B.Sounds, Abd Soft, No tenderness,    Extremity/Skin:-  pedal pulses present  Psych-affect is appropriate, oriented x3 Neuro-no new focal deficits, no tremors   Data Review:   Micro Results Recent Results (from the past 240 hour(s))  Blood culture (routine x 2)     Status: None (Preliminary result)   Collection Time: 08/17/18 11:06 AM  Result Value Ref Range Status   Specimen Description   Final    BLOOD RIGHT ARM Performed at [facility] [address] [place], [place] [address]    Special Requests   Final    BOTTLES DRAWN AEROBIC AND ANAEROBIC Blood Culture adequate volume Performed at [facility] [address] [place], [place] [address]    Culture   Final    NO GROWTH 1 DAY Performed at [facility], [place] [address], [place], [place] [address]    Report Status PENDING  Incomplete  Blood culture (routine x 2)     Status: None (Preliminary result)   Collection Time: 08/17/18 12:00 PM  Result Value Ref Range Status   Specimen Description   Final    BLOOD  RIGHT ANTECUBITAL Performed at [facility] [address] [place], [place] [address]    Special Requests   Final    BOTTLES DRAWN AEROBIC ONLY Blood Culture results may not be optimal due to an inadequate volume of blood received in culture bottles Performed at [facility] [address] [place], [place] [address]    Culture   Final    NO GROWTH 1 DAY Performed at [facility], [place] [address], [place], [place] [address]    Report Status PENDING  Incomplete  Body fluid culture     Status: None (Preliminary result)   Collection Time: 08/17/18  4:47 PM  Result Value Ref Range Status   Specimen Description   Final    PLEURAL RIGHT Performed at [facility] [address] [place], [place] [address]    Special Requests   Final    Normal Performed at [facility], [place] [address] [place], [place] [address]    Gram Stain   Final    ABUNDANT WBC PRESENT,BOTH PMN AND MONONUCLEAR NO ORGANISMS SEEN    Culture   Final    NO GROWTH < 12 HOURS Performed at [facility] [address] [place], [place] [address]    Report Status PENDING  Incomplete  MRSA PCR Screening     Status: None   Collection Time: 08/17/18  8:11 PM  Result Value Ref Range Status   MRSA by PCR NEGATIVE NEGATIVE Final    Comment:        The GeneXpert MRSA Assay (FDA approved for NASAL  specimens only), is one component of a comprehensive MRSA colonization surveillance program. It is not intended to diagnose MRSA infection nor to guide or monitor treatment for MRSA infections. Performed at [facility], [place] [address], [place], [place] [address]     Radiology Reports Dg Chest 1 View  Result Date: 08/17/2018 CLINICAL DATA:  Right-sided thoracentesis EXAM: CHEST  1 VIEW COMPARISON:  Portable chest x-ray of 08/17/2018 FINDINGS: A right thoracentesis has been performed. No pneumothorax is seen. Mild volume loss at the left lung base remains with small left effusion. Cardiomegaly is stable. Left PICC line tip is seen to the mid lower SVC. IMPRESSION: 1. No pneumothorax after right thoracentesis. 2. Little change in opacity at the left lung base consistent with atelectasis and left effusion. Electronically Signed   By: Ivar Drape M.D.   On: 08/17/2018 16:23   Ct Angio Chest Pe W And/or Wo Contrast  Result Date: 08/17/2018 CLINICAL DATA:  Low hemoglobin with pneumonia. Dyspnea and fever. EXAM: CT ANGIOGRAPHY CHEST WITH CONTRAST TECHNIQUE: Multidetector CT imaging of the chest was performed using the standard protocol during bolus administration of intravenous contrast. Multiplanar CT image reconstructions and MIPs were obtained to evaluate the vascular anatomy. CONTRAST:  116mL ISOVUE-370 IOPAMIDOL (ISOVUE-370) INJECTION 76% COMPARISON:  08/17/2018 CXR, chest CT 06/17/2018 FINDINGS: Cardiovascular: Conventional branch pattern of the great vessels with minimal atherosclerosis at the origin of the left subclavian. No aortic aneurysm or dissection. Mild aortic atherosclerosis without aneurysm or dissection. Scattered coronary arteriosclerosis. Satisfactory opacification of the pulmonary arteries to the proximal segmental level without pulmonary embolus. Heart size is mildly enlarged without pericardial effusion or thickening. Mediastinum/Nodes: Patent trachea and  mainstem bronchi. No thyroid mass. The CT appearance of the esophagus is unremarkable. No axillary, supraclavicular, mediastinal nor hilar adenopathy. Lungs/Pleura: New moderate to large right pleural effusion with trace to small  left pleural effusion. Adjacent bibasilar and compressive atelectasis is noted. Atelectasis and/or scarring is also seen in the lingula. Respiratory motion artifact slightly limit assessment of the lung bases. No pneumothorax. No dominant mass. There appear to be scattered calcified pleural plaque along the periphery of the left hemithorax. Upper Abdomen: Cholecystectomy. 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 Increased fecal retention within the included colon. Musculoskeletal: Spondylosis of the dorsal spine. No acute nor aggressive osseous lesions. Review of the MIP images confirms the above findings. IMPRESSION: 1. No acute pulmonary embolus, aortic aneurysm or dissection. 2. New moderate to large right pleural effusion with trace to small left pleural effusion. There is adjacent compressive and subsegmental atelectasis at each lung base and lingula. Aortic Atherosclerosis (ICD10-I70.0). Electronically Signed   By: Ashley Royalty M.D.   On: 08/17/2018 14:34   Dg Chest Portable 1 View  Result Date: 08/17/2018 CLINICAL DATA:  Right-sided chest pain EXAM: PORTABLE CHEST 1 VIEW COMPARISON:  CT chest 06/17/2018 FINDINGS: Small left pleural effusion. Bilateral mild interstitial thickening no focal consolidation or pneumothorax. Stable cardiomegaly. Left-sided PICC line with the tip projecting over the SVC. Moderate osteoarthritis of the right shoulder. IMPRESSION: 1. Cardiomegaly with mild pulmonary vascular congestion. Electronically Signed   By: Kathreen Devoid   On: 08/17/2018 11:44   US Thoracentesis Asp Pleural Space W/img Guide  Result Date: 08/17/2018 INDICATION: Patient with history of pneumonia, chronic left hip infection, bilateral pleural effusions right  greater than left. Request made for diagnostic and therapeutic right thoracentesis. EXAM: ULTRASOUND GUIDED DIAGNOSTIC AND THERAPEUTIC RIGHT THORACENTESIS MEDICATIONS: None COMPLICATIONS: None immediate. PROCEDURE: An ultrasound guided thoracentesis was thoroughly discussed with the patient and questions answered. The benefits, risks, alternatives and complications were also discussed. The patient understands and wishes to proceed with the procedure. Written consent was obtained. Ultrasound was performed to localize and mark an adequate pocket of fluid in the right chest. The area was then prepped and draped in the normal sterile fashion. 1% Lidocaine was used for local anesthesia. Under ultrasound guidance a 6 Fr Safe-T-Centesis catheter was introduced. Thoracentesis was performed. The catheter was removed and a dressing applied. FINDINGS: A total of approximately 830 cc of hazy, amber fluid was removed. Samples were sent to the laboratory as requested by the clinical team. IMPRESSION: Successful ultrasound guided diagnostic and therapeutic right thoracentesis yielding 830 cc of pleural fluid. Read by: Rowe Robert, PA-C Electronically Signed   By: Jacqulynn Cadet M.D.   On: 08/17/2018 16:18     CBC Recent Labs  Lab 08/17/18 1106 08/18/18 0402  WBC 5.6 6.8  HGB 9.1* 8.1*  HCT 30.6* 27.6*  PLT 250 253  MCV 91.6 91.4  MCH 27.2 26.8  MCHC 29.7* 29.3*  RDW 17.0* 17.0*  LYMPHSABS 1.1  --   MONOABS 0.7  --   EOSABS 0.3  --   BASOSABS 0.0  --     Chemistries  Recent Labs  Lab 08/17/18 1058 08/17/18 1106 08/18/18 0402  NA  --  140 140  K  --  3.7 4.0  CL  --  106 104  CO2  --  26 28  GLUCOSE  --  111* 87  BUN  --  17 17  CREATININE  --  1.05 1.02  CALCIUM  --  9.0 8.4*  MG 2.0  --   --   AST  --  14*  --   ALT  --  8  --   ALKPHOS  --  64  --  BILITOT  --  0.3  --     ------------------------------------------------------------------------------------------------------------------ No results for input(s): CHOL, HDL, LDLCALC, TRIG, CHOLHDL, LDLDIRECT in the last 72 hours.  No results found for: HGBA1C ------------------------------------------------------------------------------------------------------------------ No results for input(s): TSH, T4TOTAL, T3FREE, THYROIDAB in the last 72 hours.  Invalid input(s): FREET3 ------------------------------------------------------------------------------------------------------------------ No results for input(s): VITAMINB12, FOLATE, FERRITIN, TIBC, IRON, RETICCTPCT in the last 72 hours.  Coagulation profile Recent Labs  Lab 08/17/18 1106  INR 1.28    No results for input(s): DDIMER in the last 72 hours.  Cardiac Enzymes Recent Labs  Lab 08/17/18 1106  TROPONINI <0.03   ------------------------------------------------------------------------------------------------------------------    Component Value Date/Time   BNP 373.9 (H) 08/17/2018 1106     Burak Zerbe M.D on 08/18/2018 at 2:20 PM  Pager---[phone] Go to [email or web address] - password [email or web address] for contact info  [facility] - Office  [phone]

## 2018-08-18 NOTE — Care Management Note (Signed)
Case Management Note  Patient Details  Name: [name] MRN: [id number] Date of Birth: October 26, 1945  Subjective/Objective:                   Discharge readiness is indicated by patient meeting Recovery Milestones, including ALL of the following: ? Hemodynamic stability ? Temp=102.8, hr=133,bp 92/57 ? Sinus rhythm or acceptable ventricular rate A.Fib. ? No evidence of myocardial ischemia-not present ? Mental status at baseline  alert ? Tachypnea absent  resp rate=12 ? Hypoxemia absent -on cd-pap ? Anticoagulation regimen for next level of care established  no ? Ambulatory no ? Oral hydration, diet, and medications  ? Iv cardizem drip,iv invanez ?   Action/Plan: Not ready for transition to next level  Expected Discharge Date:  (unknown)               Expected Discharge Plan:  [place]  In-House Referral:  Clinical Social Work  Discharge planning Services  CM Consult  Post Acute Care Choice:    Choice offered to:     DME Arranged:    DME Agency:     HH Arranged:    [place] Agency:     Status of Service:  In process, will continue to follow  If discussed at Long Length of Stay Meetings, dates discussed:    Additional Comments:  Leeroy Cha, RN 08/18/2018, 8:54 AM

## 2018-08-18 NOTE — Evaluation (Addendum)
Physical Therapy Evaluation Patient Details Name: [name] MRN: [id number] DOB: [birth year] Today's Date: 08/18/2018   History of Present Illness  72 y.o. male, with history of hypertension, A. fib on Eliquis, giant cell arteritis, OSA on CPAP, left total hip arthroplasty, L hip infection, anxiety, depression, morbid obesity; recent prolonged hospital stay at [facility] with  altered mental status, respiratory distress requiring intubation, was then transferred to [facility],  extubated at [facility] where he had further prolonged stay with delirium, had a fall from bed injuring his left hip with x-ray showing new linear linear peri-prosthetic fracture; admitted to [facility] on 08/17/18 with fatigue and cough, was found to be in afib in ED; underwent thoracentesis 12/17    Clinical Impression  Pt admitted with above diagnosis. Pt currently with functional limitations due to the deficits listed below (see PT Problem List).  Pt BP 104/69, complains of dizziness now and at his baseline with supine to sit and sit to stand; pt reported dizziness resolved within  ~ 1-2 mins;  Pt is weak and deconditioned, has been participating with PT at SNF but reports the last week he wasn't able to do much PT; He verbalizes motivation but is reluctant to move without encouragement; will continue to follow in acute setting;   Pt will benefit from skilled PT to increase their independence and safety with mobility to allow discharge to the venue listed below.       Follow Up Recommendations SNF    Equipment Recommendations  None recommended by PT    Recommendations for Other Services       Precautions / Restrictions Precautions Precautions: Fall Restrictions Weight Bearing Restrictions: No Other Position/Activity Restrictions: per pt he is allowed WBAT; periprosthetic was > 2 mos ago per notes, pt was amb at [facility] bed mobility: Needs Assistance Bed  Mobility: Supine to Sit     Supine to sit: Mod assist;+2 for safety/equipment;+2 for physical assistance;HOB elevated;Min assist     General bed mobility comments: assist with trunk and LEs, incr time but good pt effort  Transfers Overall transfer level: Needs assistance Equipment used: Rolling walker (2 wheeled) Transfers: Sit to/from [facility] Sit to Stand: Mod assist;+2 physical assistance;+2 safety/equipment;From elevated surface Stand pivot transfers: Mod assist;+2 physical assistance;+2 safety/equipment       General transfer comment: sit to stand trials x2; assist to rise and stabilize, cues for hand placement and safety; chair to pt after pivot steps d/t rapid fatigue; HR 100-114  Ambulation/Gait                Stairs            Wheelchair Mobility    Modified Rankin (Stroke Patients Only)       Balance Overall balance assessment: Needs assistance Sitting-balance support: No upper extremity supported;Feet supported;Bilateral upper extremity supported Sitting balance-[name]: Fair Sitting balance - Comments: unable to wt shift     Standing balance-[name]: Poor Standing balance comment: requires UE support and external assist                             Pertinent Vitals/Pain Pain Assessment: Faces Faces Pain Scale: Hurts worst Pain Location: gluts Pain Descriptors / Indicators: Burning Pain Intervention(s): Limited activity within patient's tolerance;Monitored during session;Premedicated before session;Repositioned;Patient requesting pain meds-RN notified    Home Living Family/patient expects to be discharged  to:: Skilled nursing facility                      Prior Function Level of Independence: Needs assistance   Gait / Transfers Assistance Needed: amb short distances with RW and PT at [facility] to this admission           Hand Dominance        Extremity/Trunk Assessment    Upper Extremity Assessment Upper Extremity Assessment: Generalized weakness    Lower Extremity Assessment Lower Extremity Assessment: Generalized weakness       Communication   Communication: No difficulties  Cognition Arousal/Alertness: Awake/alert Behavior During Therapy: WFL for tasks assessed/performed Overall Cognitive Status: Within Functional Limits for tasks assessed                                        General Comments      Exercises General Exercises - Lower Extremity Ankle Circles/Pumps: AROM;Both;10 reps Heel Slides: AROM;Both;5 reps   Assessment/Plan    PT Assessment Patient needs continued PT services  PT Problem List Decreased strength;Decreased mobility;Decreased activity tolerance;Decreased balance;Decreased knowledge of precautions;Decreased knowledge of use of DME;Pain; obesity       PT Treatment Interventions DME instruction;Functional mobility training;Balance training;Patient/family education;Gait training;Therapeutic activities;Therapeutic exercise    PT Goals (Current goals can be found in the Care Plan section)  Acute Rehab PT Goals Patient Stated Goal: be able to walk PT Goal Formulation: With patient Time For Goal Achievement: 09/01/18 Potential to Achieve Goals: Good    Frequency Min 2X/week   Barriers to discharge        Co-evaluation               AM-PAC PT "6 Clicks" Mobility  Outcome Measure Help needed turning from your back to your side while in a flat bed without using bedrails?: A Lot Help needed moving from lying on your back to sitting on the side of a flat bed without using bedrails?: A Lot Help needed moving to and from a bed to a chair (including a wheelchair)?: A Lot Help needed standing up from a chair using your arms (e.g., wheelchair or bedside chair)?: A Lot Help needed to walk in hospital room?: Total Help needed climbing 3-5 steps with a railing? : Total 6 Click Score: 10    End of  Session Equipment Utilized During Treatment: Gait belt Activity Tolerance: Patient limited by fatigue;Patient limited by pain Patient left: with call bell/phone within reach;in chair;with nursing/sitter in room Nurse Communication: Mobility status PT Visit Diagnosis: Other abnormalities of gait and mobility (R26.89);Muscle weakness (generalized) (M62.81)    Time: 9326-7124 PT Time Calculation (min) (ACUTE ONLY): 41 min   Charges:   PT Evaluation $PT Eval Low Complexity: 1 Low PT Treatments $Therapeutic Activity: 23-37 mins        Kenyon Ana, PT  Pager: [phone] Acute Rehab Dept [facility]): [phone]   08/18/2018   [facility] 08/18/2018, 3:12 PM

## 2018-08-19 LAB — BASIC METABOLIC PANEL
Anion gap: 8 (ref 5–15)
BUN: 16 mg/dL (ref 8–23)
CO2: 27 mmol/L (ref 22–32)
Calcium: 8.5 mg/dL — ABNORMAL LOW (ref 8.9–10.3)
Chloride: 106 mmol/L (ref 98–111)
Creatinine, Ser: 0.89 mg/dL (ref 0.61–1.24)
GFR calc Af Amer: >60 mL/min (ref >60)
GFR calc non Af Amer: >60 mL/min (ref >60)
Glucose, Bld: 105 mg/dL — ABNORMAL HIGH (ref 70–99)
Potassium: 3.8 mmol/L (ref 3.5–5.1)
Sodium: 141 mmol/L (ref 135–145)

## 2018-08-19 LAB — CBC
HCT: 28.9 % — ABNORMAL LOW (ref 39.0–52.0)
Hemoglobin: 8.6 g/dL — ABNORMAL LOW (ref 13.0–17.0)
MCH: 26.6 pg (ref 26.0–34.0)
MCHC: 29.8 g/dL — ABNORMAL LOW (ref 30.0–36.0)
MCV: 89.5 fL (ref 80.0–100.0)
Platelets: 260 10*3/uL (ref 150–400)
RBC: 3.23 MIL/uL — ABNORMAL LOW (ref 4.22–5.81)
RDW: 16.5 % — ABNORMAL HIGH (ref 11.5–15.5)
WBC: 6.6 10*3/uL (ref 4.0–10.5)
nRBC: 0 % (ref 0.0–0.2)

## 2018-08-19 MED ORDER — GERHARDT'S BUTT CREAM
TOPICAL_CREAM | Freq: Two times a day (BID) | CUTANEOUS | Status: AC
  Administered 2018-08-19 – 2018-08-25 (×12): via TOPICAL
  Administered 2018-08-25: 1 via TOPICAL
  Administered 2018-08-26 (×2): via TOPICAL
  Administered 2018-08-27: 1 via TOPICAL
  Administered 2018-08-28 (×2): via TOPICAL
  Filled 2018-08-19 (×2): qty 1

## 2018-08-19 MED ORDER — DILTIAZEM HCL ER COATED BEADS 120 MG PO CP24
120.0000 mg | ORAL_CAPSULE | Freq: Every day | ORAL | Status: DC
  Administered 2018-08-19 – 2018-08-20 (×2): 120 mg via ORAL
  Filled 2018-08-19 (×2): qty 1

## 2018-08-19 NOTE — Consult Note (Signed)
[facility] Nurse wound consult note Reason for Consult: MASD Patient is non ambulatory from SNF. Has episodes of incontinence of bowl and bladder.  Reported to be very dependent in all his care. Does not want to mobilize  Wound type:MASD (moisture associated skin damage) Pressure Injury POA: NA Measurement:area of denudation over the posterior thighs bilaterally and over the distal buttocks  Wound bed: partial thickness skin peeling, shaggy appearance, long term skin irritation from sheer and incontinence  Drainage (amount, consistency, odor) none Periwound: intact  Dressing procedure/placement/frequency: Gerhart's butt cream, patient reports itching and burning but does not appear fungal Chair pressure redistribution pad for chair when patient is up, patient to take with him at DC to use. Mobilize as much as possible Low air loss mattress ordered by [facility] nurse yesterday prior to transfer from the ICU.  Discussed POC with patient and bedside nurse.  Re consult if needed, will not follow at this time. Thanks  Jahon Bart [employer], RN,CWOCN, CNS, [place] [phone])

## 2018-08-19 NOTE — Clinical Social Work Note (Signed)
Clinical Social Work Assessment  Patient Details  Name: [name] MRN: [id number] Date of Birth: [birth year]  Date of referral:  08/19/18               Reason for consult:  Facility Placement                Permission sought to share information with:  Facility Sport and exercise psychologist Permission granted to share information::  Yes, Verbal Permission Granted  Name::       Designer, multimedia::  SNF   Relationship::   Daughter   Contact Information:    [phone]  Housing/Transportation Living arrangements for the past 2 months:  [place], [place] of Information:  Patient Patient Interpreter Needed:  None Criminal Activity/Legal Involvement Pertinent to Current Situation/Hospitalization:  No - Comment as needed Significant Relationships:  Adult Children Lives with:  Self Do you feel safe going back to the place where you live?  Yes Need for family participation in patient care:  Yes (Comment)  Care giving concerns:   Patient was discharged to [facility] where he has been participating with PT.  At baseline he is unable to ambulate by himself but walks short distance with the help of PT At the facility he was having problems with missing ertapenem doses due to lack of IV access. He was seen by the ID physician at [facility] 4 days back who recommended completing ertapenem on 12/21, then remove his PICC line start Flagyl 500 mg twice daily indefinitely.   Social Worker assessment / plan: CSW met with the patient at beside. CSW explain role and reason for visit to assist with discharge planning. Patient reports he was recently at [facility] for rehab for about three weeks. Patient reports he wants to return to the facility to continue rehab. CSW reached out to the [facility] to inquire about placement. Per facility staff, he is currently in his co-pay days. CSW informed the patient about his copay days. Patient reports he is not agreeable  to going to SNF if he has to pay "hundereds of dollars for a copay" he cannot afford to pay. Patient reports he prefers to go home with home health. Patient reports his daughter lives across the street from his home but works during the day so he has limited support.  Patient gave CSW permission to fax clinicals to SNF's in the area if he decides to go to SNF.   FL2 completed.  PASRR done.   Plan: Home vs. SNF  Employment status:    Insurance information:  Managed Medicare PT Recommendations:  [place] / Referral to community resources:  [place]  Patient/Family's Response to care:  Agreeable and Responding well to care.   Patient/Family's Understanding of and Emotional Response to Diagnosis, Current Treatment, and Prognosis: Patient alert and oriented and has a good understanding of his care. Patient express frustration about his copay at SNF.   Emotional Assessment Appearance:  Appears stated age Attitude/Demeanor/Rapport:    Affect (typically observed):  Accepting Orientation:  Oriented to Self, Oriented to Place, Oriented to Situation, Oriented to  Time Alcohol / Substance use:  Not Applicable Psych involvement (Current and /or in the community):  No (Comment)  Discharge Needs  Concerns to be addressed:  Discharge Planning Concerns Readmission within the last 30 days:  No Current discharge risk:  Dependent with Mobility Barriers to Discharge:  Continued Medical Work up, Ship broker  Lia Hopping, LCSW 08/19/2018, 1:33 PM

## 2018-08-19 NOTE — Progress Notes (Signed)
Patient Demographics:    [name], is a 72 y.o. male, DOB - 1946/07/26, NWG:[id number]  Admit date - 08/17/2018   Admitting Physician Louellen Molder, MD  Outpatient Primary MD for the patient is [name], MD  LOS - 2   Chief Complaint  Patient presents with  . Abnormal Lab        Subjective:    [name] today has no fevers, no emesis,  No chest pain, tachycardia persist, fatigue persist  Assessment  & Plan :    Principal Problem:   Atrial fibrillation with RVR (HCC) Active Problems:   Left hip postoperative wound infection   SIRS (systemic inflammatory response syndrome) (HCC)   Anemia of chronic disease   Depression   OSA (obstructive sleep apnea)   Obesity, Class III, BMI 40-49.9 (morbid obesity) (HCC)   Hypertensive urgency   Pleural effusion on left   Acute on chronic diastolic (congestive) heart failure (HCC)   OSA on CPAP  Brief Summary 72 y.o. male, with history of hypertension, A. 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 fib with RVR and also found to have right-sided pleural effusion, IR removed 830 mL of clear fluid   Plan:-  1) chronic atrial fibrillation with RVR--- rate control remains challenging, heart rate 110 to 120s, add Cardizem CD 120 mg daily, continue metoprolol to 25 mg 3 times daily, patient is off off IV Cardizem ,  last known EF was 50 to 55% , continue Eliquis for anticoagulation  2)HFpEF with right-sided pleural effusion--- patient with history of chronic diastolic dysfunction CHF suspect exacerbation secondary to A. fib with RVR, improved post thoracentesis on 08/17/2018 with removal of 830 mL of clear fluid (fluid Gram stain and culture negative so far),   continue Lasix 40 mg twice daily, daily weights and strict I's and O's, supplemental oxygen as needed  3)Lt Hip postoperative wound infection----patient usually follows with infectious disease at [facility], continue IV ertapenem through 08/21/2018  4) morbid obesity with OSA-- c/n CPAP nightly  5)DEpression----stable,  Seroquel and Remeron were held due to concerns about QT prolongation, okay to continue Lexapro   6) chronic anemia----etiology unclear, stool is Hemoccult positive at this time but H&H is stable at 8.6, patient on Eliquis due to history of A. Fib , monitor H&H closely, continue Protonix  7) generalized weakness/debility--- PT eval appreciated awaiting insurance approval for SNF Rehab discharge  Disposition/Need for in-Hospital Stay- patient unable to be discharged at this time due to awaiting insurance approval for SNF rehab transfer, also adjusting medications for A. fib rate control   Code Status : Full  Disposition Plan  : SNF Rehab  DVT Prophylaxis  : Eliquis  Lab Results  Component Value Date   PLT 260 08/19/2018    Inpatient Medications  Scheduled Meds: . apixaban  5 mg Oral BID  . cholecalciferol  2,000 Units Oral QPM  . cycloSPORINE  1 drop Both Eyes BID  . diltiazem  120 mg Oral Daily  . escitalopram  10 mg Oral Daily  . ferrous sulfate  325 mg Oral  BID WC  . gabapentin  100 mg Oral TID  . Gerhardt's butt cream   Topical BID  . Melatonin  6 mg Oral QHS  . metoprolol tartrate  25 mg Oral TID  . multivitamin with minerals  1 tablet Oral QPM  . pantoprazole  40 mg Oral Daily  . rOPINIRole  0.25 mg Oral QHS  . senna-docusate  2 tablet Oral BID  . sodium chloride flush  10-40 mL Intracatheter Q12H  . thiamine  100 mg Oral Daily   Continuous Infusions: . ertapenem 1,000 mg (08/19/18 1232)   PRN Meds:.acetaminophen **OR** acetaminophen, calcium carbonate, hydrOXYzine, ipratropium-albuterol, menthol-cetylpyridinium, ondansetron **OR** ondansetron  (ZOFRAN) IV, oxyCODONE, polyethylene glycol, polyvinyl alcohol, sodium chloride flush    Anti-infectives (From admission, onward)   Start     Dose/Rate Route Frequency Ordered Stop   08/17/18 1730  ertapenem (INVANZ) 1,000 mg in sodium chloride 0.9 % 100 mL IVPB  Status:  Discontinued     1 g 200 mL/hr over 30 Minutes Intravenous Every 24 hours 08/17/18 1720 08/17/18 1728   08/17/18 1200  ertapenem (INVANZ) 1,000 mg in sodium chloride 0.9 % 100 mL IVPB     1 g 200 mL/hr over 30 Minutes Intravenous Every 24 hours 08/17/18 1036          Objective:   Vitals:   08/18/18 1848 08/18/18 2049 08/18/18 2115 08/19/18 0520  BP: 113/70  124/84 119/76  Pulse: (!) 119 (!) 110 (!) 114 (!) 112  Resp: 18 17 18 18   Temp: 99.7 F (37.6 C)  99.5 F (37.5 C) 98.2 F (36.8 C)  TempSrc: Oral  Oral Oral  SpO2: 95% 96% 96% 93%  Weight:    (!) 139.5 kg  Height:        Wt Readings from Last 3 Encounters:  08/19/18 (!) 139.5 kg  04/24/18 (!) 163.6 kg  03/17/18 (!) 155.6 kg     Intake/Output Summary (Last 24 hours) at 08/19/2018 1402 Last data filed at 08/19/2018 1205 Gross per 24 hour  Intake 360 ml  Output 2550 ml  Net -2190 ml     Physical Exam Patient is examined daily including today on 08/19/18 , exams remain the same as of yesterday except that has changed   Gen:- Awake Alert, able to speak in sentences, obese HEENT:- [place].AT, No sclera icterus Neck-Supple Neck,No JVD,.  Lungs-improved air movement on the right after thoracentesis  CV- S1, S2 normal, irregularly irregular heart rate was 110s abd-  +ve B.Sounds, Abd Soft, No tenderness,    Extremity/Skin:-  pedal pulses present  Psych-affect is appropriate, oriented x3 Neuro-no new focal deficits, no tremors   Data Review:   Micro Results Recent Results (from the past 240 hour(s))  Blood culture (routine x 2)     Status: None (Preliminary result)   Collection Time: 08/17/18 11:06 AM  Result Value Ref Range Status   Specimen  Description   Final    BLOOD RIGHT ARM Performed at [facility] [address], [place], [place] [address]    Special Requests   Final    BOTTLES DRAWN AEROBIC AND ANAEROBIC Blood Culture adequate volume Performed at [facility] [address] [place], [place] [address]    Culture   Final    NO GROWTH 2 DAYS Performed at [facility] [address], [place], [place] [address]    Report Status PENDING  Incomplete  Blood culture (routine x 2)  Status: None (Preliminary result)   Collection Time: 08/17/18 12:00 PM  Result Value Ref Range Status   Specimen Description   Final    BLOOD RIGHT ANTECUBITAL Performed at [facility] [address] [place], [place] [address]    Special Requests   Final    BOTTLES DRAWN AEROBIC ONLY Blood Culture results may not be optimal due to an inadequate volume of blood received in culture bottles Performed at [facility] [address], [place], [place] [address]    Culture   Final    NO GROWTH 2 DAYS Performed at [facility] [address], [place], [place] [address]    Report Status PENDING  Incomplete  Body fluid culture     Status: None (Preliminary result)   Collection Time: 08/17/18  4:47 PM  Result Value Ref Range Status   Specimen Description   Final    PLEURAL RIGHT Performed at [facility] [address] [place], [place] [address]    Special Requests   Final    Normal Performed at [facility], [place] [address] [place], [place] [address]    Gram Stain   Final    ABUNDANT WBC PRESENT,BOTH PMN AND MONONUCLEAR NO ORGANISMS SEEN    Culture   Final    NO GROWTH 2 DAYS Performed at [facility], [place] [address] [place], [place] [address]    Report Status PENDING  Incomplete  MRSA PCR Screening     Status: None   Collection Time: 08/17/18  8:11 PM  Result Value Ref Range Status   MRSA  by PCR NEGATIVE NEGATIVE Final    Comment:        The GeneXpert MRSA Assay (FDA approved for NASAL specimens only), is one component of a comprehensive MRSA colonization surveillance program. It is not intended to diagnose MRSA infection nor to guide or monitor treatment for MRSA infections. Performed at [facility], [place] [address], [place], [place] [address]     Radiology Reports Dg Chest 1 View  Result Date: 08/17/2018 CLINICAL DATA:  Right-sided thoracentesis EXAM: CHEST  1 VIEW COMPARISON:  Portable chest x-ray of 08/17/2018 FINDINGS: A right thoracentesis has been performed. No pneumothorax is seen. Mild volume loss at the left lung base remains with small left effusion. Cardiomegaly is stable. Left PICC line tip is seen to the mid lower SVC. IMPRESSION: 1. No pneumothorax after right thoracentesis. 2. Little change in opacity at the left lung base consistent with atelectasis and left effusion. Electronically Signed   By: Ivar Drape M.D.   On: 08/17/2018 16:23   Ct Angio Chest Pe W And/or Wo Contrast  Result Date: 08/17/2018 CLINICAL DATA:  Low hemoglobin with pneumonia. Dyspnea and fever. EXAM: CT ANGIOGRAPHY CHEST WITH CONTRAST TECHNIQUE: Multidetector CT imaging of the chest was performed using the standard protocol during bolus administration of intravenous contrast. Multiplanar CT image reconstructions and MIPs were obtained to evaluate the vascular anatomy. CONTRAST:  138mL ISOVUE-370 IOPAMIDOL (ISOVUE-370) INJECTION 76% COMPARISON:  08/17/2018 CXR, chest CT 06/17/2018 FINDINGS: Cardiovascular: Conventional branch pattern of the great vessels with minimal atherosclerosis at the origin of the left subclavian. No aortic aneurysm or dissection. Mild aortic atherosclerosis without aneurysm or dissection. Scattered coronary arteriosclerosis. Satisfactory opacification of the pulmonary arteries to the proximal segmental level without pulmonary embolus. Heart size is  mildly enlarged without pericardial effusion or thickening. Mediastinum/Nodes: Patent trachea and mainstem bronchi. No thyroid mass.  The CT appearance of the esophagus is unremarkable. No axillary, supraclavicular, mediastinal nor hilar adenopathy. Lungs/Pleura: New moderate to large right pleural effusion with trace to small left pleural effusion. Adjacent bibasilar and compressive atelectasis is noted. Atelectasis and/or scarring is also seen in the lingula. Respiratory motion artifact slightly limit assessment of the lung bases. No pneumothorax. No dominant mass. There appear to be scattered calcified pleural plaque along the periphery of the left hemithorax. Upper Abdomen: Cholecystectomy. 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 Increased fecal retention within the included colon. Musculoskeletal: Spondylosis of the dorsal spine. No acute nor aggressive osseous lesions. Review of the MIP images confirms the above findings. IMPRESSION: 1. No acute pulmonary embolus, aortic aneurysm or dissection. 2. New moderate to large right pleural effusion with trace to small left pleural effusion. There is adjacent compressive and subsegmental atelectasis at each lung base and lingula. Aortic Atherosclerosis (ICD10-I70.0). Electronically Signed   By: Ashley Royalty M.D.   On: 08/17/2018 14:34   Dg Chest Portable 1 View  Result Date: 08/17/2018 CLINICAL DATA:  Right-sided chest pain EXAM: PORTABLE CHEST 1 VIEW COMPARISON:  CT chest 06/17/2018 FINDINGS: Small left pleural effusion. Bilateral mild interstitial thickening no focal consolidation or pneumothorax. Stable cardiomegaly. Left-sided PICC line with the tip projecting over the SVC. Moderate osteoarthritis of the right shoulder. IMPRESSION: 1. Cardiomegaly with mild pulmonary vascular congestion. Electronically Signed   By: Kathreen Devoid   On: 08/17/2018 11:44   US Thoracentesis Asp Pleural Space W/img Guide  Result Date: 08/17/2018 INDICATION:  Patient with history of pneumonia, chronic left hip infection, bilateral pleural effusions right greater than left. Request made for diagnostic and therapeutic right thoracentesis. EXAM: ULTRASOUND GUIDED DIAGNOSTIC AND THERAPEUTIC RIGHT THORACENTESIS MEDICATIONS: None COMPLICATIONS: None immediate. PROCEDURE: An ultrasound guided thoracentesis was thoroughly discussed with the patient and questions answered. The benefits, risks, alternatives and complications were also discussed. The patient understands and wishes to proceed with the procedure. Written consent was obtained. Ultrasound was performed to localize and mark an adequate pocket of fluid in the right chest. The area was then prepped and draped in the normal sterile fashion. 1% Lidocaine was used for local anesthesia. Under ultrasound guidance a 6 Fr Safe-T-Centesis catheter was introduced. Thoracentesis was performed. The catheter was removed and a dressing applied. FINDINGS: A total of approximately 830 cc of hazy, amber fluid was removed. Samples were sent to the laboratory as requested by the clinical team. IMPRESSION: Successful ultrasound guided diagnostic and therapeutic right thoracentesis yielding 830 cc of pleural fluid. Read by: Rowe Robert, PA-C Electronically Signed   By: Jacqulynn Cadet M.D.   On: 08/17/2018 16:18     CBC Recent Labs  Lab 08/17/18 1106 08/18/18 0402 08/19/18 0841  WBC 5.6 6.8 6.6  HGB 9.1* 8.1* 8.6*  HCT 30.6* 27.6* 28.9*  PLT 250 253 260  MCV 91.6 91.4 89.5  MCH 27.2 26.8 26.6  MCHC 29.7* 29.3* 29.8*  RDW 17.0* 17.0* 16.5*  LYMPHSABS 1.1  --   --   MONOABS 0.7  --   --   EOSABS 0.3  --   --   BASOSABS 0.0  --   --     Chemistries  Recent Labs  Lab 08/17/18 1058 08/17/18 1106 08/18/18 0402 08/19/18 0841  NA  --  140 140 141  K  --  3.7 4.0 3.8  CL  --  106 104 106  CO2  --  26 28 27   GLUCOSE  --  111* 87  105*  BUN  --  17 17 16   CREATININE  --  1.05 1.02 0.89  CALCIUM  --  9.0 8.4* 8.5*    MG 2.0  --   --   --   AST  --  14*  --   --   ALT  --  8  --   --   ALKPHOS  --  64  --   --   BILITOT  --  0.3  --   --    Coagulation profile Recent Labs  Lab 08/17/18 1106  INR 1.28    No results for input(s): DDIMER in the last 72 hours.  Cardiac Enzymes Recent Labs  Lab 08/17/18 1106  TROPONINI <0.03   ------------------------------------------------------------------------------------------------------------------    Component Value Date/Time   BNP 373.9 (H) 08/17/2018 1106    Lalonnie Shaffer M.D on 08/19/2018 at 2:02 PM  Pager---[phone] Go to [email or web address] - password [email or web address] for contact info  [facility] - Office  [phone]

## 2018-08-19 NOTE — NC FL2 (Signed)
[place] LEVEL OF CARE SCREENING TOOL     IDENTIFICATION  Patient Name: [name] Birthdate: 12-26-1945 Sex: male Admission Date (Current Location): 08/17/2018  [facility] and [place] Number:  Herbalist and Address:  [facility],  [place] [address] [place]      Provider Number: [id number]  Attending Physician Name and Address:  Roxan Hockey, MD  Relative Name and Phone Number:       Current Level of Care: Hospital Recommended Level of Care: [place] Prior Approval Number:    Date Approved/Denied:   PASRR Number: [id number]  Discharge Plan: SNF    Current Diagnoses: Patient Active Problem List   Diagnosis Date Noted  . Hypertensive urgency 08/17/2018  . Pleural effusion on left 08/17/2018  . Acute on chronic diastolic (congestive) heart failure ([place]) 08/17/2018  . OSA on CPAP 08/17/2018  . Atrial fibrillation with rapid ventricular response ([place])   . Delirium   . Depression   . OSA (obstructive sleep apnea)   . Obesity, Class III, BMI 40-49.9 (morbid obesity) ([place])   . HCAP (healthcare-associated pneumonia) 04/16/2018  . Atrial fibrillation with RVR ([place]) 04/16/2018  . Morbid obesity with BMI of 50.0-59.9, adult ([place]) 04/16/2018  . Acute kidney injury ([place]) 04/16/2018  . Visual hallucination 04/16/2018  . SIRS (systemic inflammatory response syndrome) ([place]) 04/16/2018  . Anemia of chronic disease 04/16/2018  . Left hip postoperative wound infection 03/15/2018  . 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 Femur fracture, left ([place]) 02/15/2018  . Anemia 02/15/2018  . Femur fracture ([place]) 02/15/2018  . 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 SOB (shortness of breath) 03/31/2014  . Hypertension   . Hyponatremia 03/18/2012  . 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[place]) 03/17/2012    Orientation RESPIRATION BLADDER Height & Weight     Self, Time, Situation,  Place  Normal Continent Weight: (!) 307 lb 8.7 oz (139.5 kg) Height:  5\' 8"  (172.7 cm)  BEHAVIORAL SYMPTOMS/MOOD NEUROLOGICAL BOWEL NUTRITION STATUS      Continent Diet(Heart Healthy )  AMBULATORY STATUS COMMUNICATION OF NEEDS Skin   Extensive Assist Verbally Surgical wounds(Left Hip )                       Personal Care Assistance Level of Assistance  Bathing, Dressing, Feeding Bathing Assistance: Maximum assistance   Dressing Assistance: Maximum assistance     Functional Limitations Info  Sight, Hearing, Speech Sight Info: Impaired Hearing Info: Adequate Speech Info: Adequate    SPECIAL CARE FACTORS FREQUENCY  PT (By licensed PT), OT (By licensed OT)     PT Frequency: 5x/week OT Frequency: 5x/week            Contractures Contractures Info: Not present    Additional Factors Info  Insulin Sliding Scale Code Status Info: Fullcode  Allergies Info: Allergies: Bee Venom, Nickel           Current Medications (08/19/2018):  This is the current hospital active medication list Current Facility-Administered Medications  Medication Dose Route Frequency Provider Last Rate Last Dose  . acetaminophen (TYLENOL) tablet 650 mg  650 mg Oral Q6H PRN Dhungel, Nishant, MD   650 mg at 08/18/18 2159   Or  . acetaminophen (TYLENOL) suppository 650 mg  650 mg Rectal Q6H PRN Dhungel, Nishant, MD      . apixaban (ELIQUIS) tablet 5 mg  5 mg Oral BID Dhungel, Nishant, MD  5 mg at 08/19/18 1016  . calcium carbonate (TUMS - dosed in mg elemental calcium) chewable tablet 1,000 mg  1,000 mg Oral TID PRN Dhungel, Nishant, MD      . cholecalciferol (VITAMIN D3) tablet 2,000 Units  2,000 Units Oral QPM Dhungel, Nishant, MD   2,000 Units at 08/18/18 1645  . cycloSPORINE (RESTASIS) 0.05 % ophthalmic emulsion 1 drop  1 drop Both Eyes BID Dhungel, Nishant, MD   1 drop at 08/18/18 0912  . diltiazem (CARDIZEM CD) 24 hr capsule 120 mg  120 mg Oral Daily Emokpae, Courage, MD   120 mg at 08/19/18  1016  . ertapenem (INVANZ) 1,000 mg in sodium chloride 0.9 % 100 mL IVPB  1 g Intravenous Q24H Duffy Bruce, MD 200 mL/hr at 08/19/18 1232 1,000 mg at 08/19/18 1232  . escitalopram (LEXAPRO) tablet 10 mg  10 mg Oral Daily Emokpae, Courage, MD   10 mg at 08/19/18 1016  . ferrous sulfate tablet 325 mg  325 mg Oral BID WC Dhungel, Nishant, MD   325 mg at 08/19/18 1016  . gabapentin (NEURONTIN) capsule 100 mg  100 mg Oral TID Dhungel, Nishant, MD   100 mg at 08/19/18 1016  . Gerhardt's butt cream   Topical BID Emokpae, Courage, MD      . hydrOXYzine (ATARAX/VISTARIL) tablet 25 mg  25 mg Oral TID PRN Dhungel, Nishant, MD   25 mg at 08/17/18 2207  . ipratropium-albuterol (DUONEB) 0.5-2.5 (3) MG/3ML nebulizer solution 3 mL  3 mL Inhalation Q4H PRN Dhungel, Nishant, MD      . Melatonin TABS 6 mg  6 mg Oral QHS Dhungel, Nishant, MD   6 mg at 08/18/18 2158  . menthol-cetylpyridinium (CEPACOL) lozenge 3 mg  1 lozenge Oral Q2H PRN Dhungel, Nishant, MD      . metoprolol tartrate (LOPRESSOR) tablet 25 mg  25 mg Oral TID Denton Brick, Courage, MD   25 mg at 08/19/18 1016  . multivitamin with minerals tablet 1 tablet  1 tablet Oral QPM Dhungel, Nishant, MD   1 tablet at 08/18/18 1645  . ondansetron (ZOFRAN) tablet 4 mg  4 mg Oral Q6H PRN Dhungel, Nishant, MD       Or  . ondansetron (ZOFRAN) injection 4 mg  4 mg Intravenous Q6H PRN Dhungel, Nishant, MD      . oxyCODONE (Oxy IR/ROXICODONE) immediate release tablet 5 mg  5 mg Oral Q6H PRN Dhungel, Nishant, MD   5 mg at 08/19/18 0036  . pantoprazole (PROTONIX) EC tablet 40 mg  40 mg Oral Daily Dhungel, Nishant, MD   40 mg at 08/19/18 1016  . polyethylene glycol (MIRALAX / GLYCOLAX) packet 17 g  17 g Oral Daily PRN Dhungel, Nishant, MD      . polyvinyl alcohol (LIQUIFILM TEARS) 1.4 % ophthalmic solution 1 drop  1 drop Both Eyes Daily PRN Dhungel, Nishant, MD      . rOPINIRole (REQUIP) tablet 0.25 mg  0.25 mg Oral QHS Dhungel, Nishant, MD   0.25 mg at 08/18/18 2159  .  senna-docusate (Senokot-S) tablet 2 tablet  2 tablet Oral BID Dhungel, Nishant, MD   2 tablet at 08/19/18 1016  . sodium chloride flush (NS) 0.9 % injection 10-40 mL  10-40 mL Intracatheter Q12H Dhungel, Nishant, MD   10 mL at 08/18/18 2200  . sodium chloride flush (NS) 0.9 % injection 10-40 mL  10-40 mL Intracatheter PRN Dhungel, Nishant, MD      . thiamine (VITAMIN B-1) tablet 100 mg  100 mg Oral Daily Dhungel, Nishant, MD   100 mg at 08/19/18 1016     Discharge Medications: Please see discharge summary for a list of discharge medications.  Relevant Imaging Results:  Relevant Lab Results:   Additional Information SS# [id number]. Weight 360 lb. Patient uses CPAP   Lia Hopping, LCSW

## 2018-08-20 NOTE — Progress Notes (Signed)
Physical Therapy Treatment Patient Details Name: [name] MRN: [id number] DOB: October 27, 1945 Today's Date: 08/20/2018    History of Present Illness 72 y.o. male, with history of hypertension, A. fib on Eliquis, giant cell arteritis, OSA on CPAP, left total hip arthroplasty, L hip infection, anxiety, depression, morbid obesity; recent prolonged hospital stay at [facility] with  altered mental status, respiratory distress requiring intubation, was then transferred to [facility],  extubated at [facility] where he had further prolonged stay with delirium, had a fall from bed injuring his left hip with x-ray showing new linear linear peri-prosthetic fracture; admitted to [facility] on 08/17/18 with fatigue and cough, was found to be in afib in ED; underwent thoracentesis 12/17      PT Comments    Pt progressing toward goals, incr gait distance and activity tolerance; pt does not feel he can tolerate sitting in blue recliner d/t his size related to size of chair; have contacted portable equipment again to bring bari-chair to room, RN made aware also;  discussed with Mr. Clasby that the goal on next PT visit should be incr amb distance and to sit in recliner for at least an hour, he has an air cushion in the room and does report that sitting is more tolerable with the cushion;   Encouraged pt to perform heel slides, ankle pumps and quad/glut sets as well as self scooting up and rolling in the bed ant that the nurses should not be doing this for him.  Follow Up Recommendations  SNF     Equipment Recommendations  None recommended by PT    Recommendations for Other Services       Precautions / Restrictions Precautions Precautions: Fall Restrictions Weight Bearing Restrictions: No Other Position/Activity Restrictions: per pt he is allowed WBAT; periprosthetic was > 2 mos ago per notes, pt was amb at [facility] bed mobility: Needs Assistance Bed Mobility:  Supine to Sit;Sit to Supine     Supine to sit: Min assist Sit to supine: +2 for physical assistance;+2 for safety/equipment;Mod assist   General bed mobility comments: heavy use of rail to come to sit, incr pt effort with multi-modal cues and firm instruction; assisit with bil LEs and trunk descent/positioning to return to supine; min assist to scoot up in bed with bed positioned in trendelenburg  Transfers Overall transfer level: Needs assistance Equipment used: Rolling walker (2 wheeled) Transfers: Sit to/from [facility] Sit to Stand: Min assist;+2 safety/equipment;+2 physical assistance Stand pivot transfers: Min assist;+2 physical assistance;+2 safety/equipment       General transfer comment: cues for foot positioning, hand placement and anterior wt translation; cues for sequencing for stand pivot  Ambulation/Gait Ambulation/Gait assistance: Min assist;+2 physical assistance;+2 safety/equipment Gait Distance (Feet): 15 Feet Assistive device: Rolling walker (2 wheeled) Gait Pattern/deviations: Step-to pattern;Decreased step length - right;Decreased step length - left;Wide base of support     General Gait Details: cues for sequence, RW position, trunk extension; firm cues/direction and encouragement    Stairs             Wheelchair Mobility    Modified Rankin (Stroke Patients Only)       Balance     Sitting balance-[name]: Fair Sitting balance - Comments: unable to wt shift, incr time to sit wihout UE support     Standing balance-[name]: Poor  Cognition Arousal/Alertness: Awake/alert Behavior During Therapy: WFL for tasks assessed/performed Overall Cognitive Status: Within Functional Limits for tasks assessed                                        Exercises General Exercises - Lower Extremity Ankle Circles/Pumps: AROM;Both;10 reps Heel Slides: AROM;Both;5 reps     General Comments        Pertinent Vitals/Pain Pain Assessment: Faces Faces Pain Scale: Hurts little more Pain Location: gluts Pain Descriptors / Indicators: Burning Pain Intervention(s): Limited activity within patient's tolerance;Monitored during session;Repositioned    Home Living                      Prior Function            PT Goals (current goals can now be found in the care plan section) Acute Rehab PT Goals Patient Stated Goal: be able to walk PT Goal Formulation: With patient Time For Goal Achievement: 09/01/18 Potential to Achieve Goals: Good Progress towards PT goals: Progressing toward goals    Frequency    Min 2X/week      PT Plan Current plan remains appropriate    Co-evaluation              AM-PAC PT "6 Clicks" Mobility   Outcome Measure  Help needed turning from your back to your side while in a flat bed without using bedrails?: A Lot Help needed moving from lying on your back to sitting on the side of a flat bed without using bedrails?: A Lot Help needed moving to and from a bed to a chair (including a wheelchair)?: A Lot Help needed standing up from a chair using your arms (e.g., wheelchair or bedside chair)?: A Lot Help needed to walk in hospital room?: A Lot Help needed climbing 3-5 steps with a railing? : Total 6 Click Score: 11    End of Session Equipment Utilized During Treatment: Gait belt Activity Tolerance: Patient tolerated treatment well Patient left: with call bell/phone within reach;in bed   PT Visit Diagnosis: Other abnormalities of gait and mobility (R26.89);Muscle weakness (generalized) (M62.81)     Time: 9675-9163 PT Time Calculation (min) (ACUTE ONLY): 40 min  Charges:  $Gait Training: 23-37 mins $Therapeutic Activity: 8-22 mins                     Kenyon Ana, PT  Pager: [phone] Acute Rehab Dept [facility]): [phone]   08/20/2018    [facility] 08/20/2018, 3:26 PM

## 2018-08-20 NOTE — Progress Notes (Signed)
Patient Demographics:    [name], is a 72 y.o. male, DOB - July 30, 1946, GDJ:[id number]  Admit date - 08/17/2018   Admitting Physician No admitting provider for patient encounter.  Outpatient Primary MD for the patient is [name], MD  LOS - 3   Chief Complaint  Patient presents with  . Abnormal Lab        Subjective:    [name] today has no fevers, no emesis,  No chest pain, tachycardia persist, fatigue persist  Assessment  & Plan :    Principal Problem:   Atrial fibrillation with RVR (HCC) Active Problems:   Left hip postoperative wound infection   SIRS (systemic inflammatory response syndrome) (HCC)   Anemia of chronic disease   Depression   OSA (obstructive sleep apnea)   Obesity, Class III, BMI 40-49.9 (morbid obesity) (HCC)   Hypertensive urgency   Pleural effusion on left   Acute on chronic diastolic (congestive) heart failure (HCC)   OSA on CPAP  Brief Summary 72 y.o. male, with history of hypertension, A. 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 fib with RVR and also found to have right-sided pleural effusion, IR removed 830 mL of Cooper fluid, awaiting insurance approval for transfer back to SNF rehab   Plan:-  1) chronic atrial fibrillation with RVR--- rate control is improved, continue Cardizem CD 120 mg daily, continue metoprolol to 25 mg 3 times daily, patient is off off IV Cardizem ,  last known EF was 50 to 55% , continue Eliquis for anticoagulation  2)HFpEF with right-sided pleural effusion--- patient with history of chronic diastolic dysfunction CHF suspect exacerbation secondary to A. fib with RVR, improved post thoracentesis on 08/17/2018 with removal of 830 mL of Cooper  fluid (fluid Gram stain and culture negative so far),  continue Lasix 40 mg twice daily, daily weights and strict I's and O's, supplemental oxygen as needed  3)Lt Hip postoperative wound infection----patient usually follows with infectious disease at [facility], continue IV ertapenem through 08/21/2018  4)Morbid Obesity with OSA-- c/n CPAP nightly  5)DEpression----stable,  Seroquel and Remeron were held due to concerns about QT prolongation,  continue Lexapro   6)Chronic Anemia----etiology unclear, stool is Hemoccult positive at this time but H&H is stable at 8.6, patient on Eliquis due to history of A. Fib , monitor H&H closely, continue Protonix  7)Generalized Weakness/Debility--- PT eval appreciated, recommends SNF rehab awaiting insurance approval for SNF Rehab discharge  Disposition/Need for in-Hospital Stay- patient unable to be discharged at this time due to awaiting insurance approval for SNF rehab transfer,   Code Status : Full  Disposition Plan  : SNF Rehab  DVT Prophylaxis  : Eliquis  Lab Results  Component Value Date   PLT 260 08/19/2018    Inpatient Medications  Scheduled Meds: . apixaban  5 mg Oral BID  . cholecalciferol  2,000 Units Oral QPM  . cycloSPORINE  1 drop Both Eyes BID  . diltiazem  120 mg Oral Daily  . escitalopram  10 mg Oral Daily  . ferrous sulfate  325 mg Oral BID WC  .  gabapentin  100 mg Oral TID  . Gerhardt's butt cream   Topical BID  . Melatonin  6 mg Oral QHS  . metoprolol tartrate  25 mg Oral TID  . multivitamin with minerals  1 tablet Oral QPM  . pantoprazole  40 mg Oral Daily  . rOPINIRole  0.25 mg Oral QHS  . senna-docusate  2 tablet Oral BID  . sodium chloride flush  10-40 mL Intracatheter Q12H  . thiamine  100 mg Oral Daily   Continuous Infusions: . ertapenem 1,000 mg (08/20/18 1134)   PRN Meds:.acetaminophen **OR** acetaminophen, calcium carbonate, hydrOXYzine, ipratropium-albuterol, menthol-cetylpyridinium, ondansetron  **OR** ondansetron (ZOFRAN) IV, oxyCODONE, polyethylene glycol, polyvinyl alcohol, sodium chloride flush    Anti-infectives (From admission, onward)   Start     Dose/Rate Route Frequency Ordered Stop   08/17/18 1730  ertapenem (INVANZ) 1,000 mg in sodium chloride 0.9 % 100 mL IVPB  Status:  Discontinued     1 g 200 mL/hr over 30 Minutes Intravenous Every 24 hours 08/17/18 1720 08/17/18 1728   08/17/18 1200  ertapenem (INVANZ) 1,000 mg in sodium chloride 0.9 % 100 mL IVPB     1 g 200 mL/hr over 30 Minutes Intravenous Every 24 hours 08/17/18 1036 08/21/18 2359        Objective:   Vitals:   08/19/18 2106 08/20/18 0552 08/20/18 1151 08/20/18 1347  BP: 136/73 136/83  132/83  Pulse: (!) 101 (!) 101  97  Resp: 17 18    Temp: 99.1 F (37.3 C) 98.4 F (36.9 C) 98.6 F (37 C) 98.9 F (37.2 C)  TempSrc: Oral Oral Oral Oral  SpO2: 93% 91%  94%  Weight:      Height:        Wt Readings from Last 3 Encounters:  08/19/18 (!) 139.5 kg  04/24/18 (!) 163.6 kg  03/17/18 (!) 155.6 kg     Intake/Output Summary (Last 24 hours) at 08/20/2018 1738 Last data filed at 08/20/2018 0932 Gross per 24 hour  Intake 797.56 ml  Output 1175 ml  Net -377.44 ml     Physical Exam Patient is examined daily including today on 08/20/18 , exams remain the same as of yesterday except that has changed   Gen:- Awake Alert, able to speak in sentences, obese HEENT:- [place].AT, No sclera icterus Neck-Supple Neck,No JVD,.  Lungs-improved air movement on the right after thoracentesis  CV- S1, S2 normal, irregularly irregular heart rate 96 abd-  +ve B.Sounds, Abd Soft, No tenderness, increased truncal adiposity    Extremity/Skin:-  pedal pulses present  Psych-affect is appropriate, oriented x3 Neuro-no new focal deficits, no tremors MSK-left hip wound appears to have healed nicely  Data Review:   Micro Results Recent Results (from the past 240 hour(s))  Blood culture (routine x 2)     Status: None  (Preliminary result)   Collection Time: 08/17/18 11:06 AM  Result Value Ref Range Status   Specimen Description   Final    BLOOD RIGHT ARM Performed at [facility] [address] [place], [place] [address]    Special Requests   Final    BOTTLES DRAWN AEROBIC AND ANAEROBIC Blood Culture adequate volume Performed at [facility] [address], [place], [place] [address]    Culture   Final    NO GROWTH 3 DAYS Performed at [facility], [employer] [address], [place], [place] [address]    Report Status PENDING  Incomplete  Blood culture (routine x 2)  Status: None (Preliminary result)   Collection Time: 08/17/18 12:00 PM  Result Value Ref Range Status   Specimen Description   Final    BLOOD RIGHT ANTECUBITAL Performed at [facility] [address] [place], [place] [address]    Special Requests   Final    BOTTLES DRAWN AEROBIC ONLY Blood Culture results may not be optimal due to an inadequate volume of blood received in culture bottles Performed at [facility] [address], [place], [place] [address]    Culture   Final    NO GROWTH 3 DAYS Performed at [facility], [place] [address] [place], [place] [address]    Report Status PENDING  Incomplete  Body fluid culture     Status: None (Preliminary result)   Collection Time: 08/17/18  4:47 PM  Result Value Ref Range Status   Specimen Description   Final    PLEURAL RIGHT Performed at [facility] [address], [place], [place] [address]    Special Requests   Final    Normal Performed at [facility], [place] [address] [place], [place] [address]    Gram Stain   Final    ABUNDANT WBC PRESENT,BOTH PMN AND MONONUCLEAR NO ORGANISMS SEEN    Culture   Final    NO GROWTH 3 DAYS Performed at [facility], [place] [address], [place], [place] [address]    Report Status PENDING  Incomplete  MRSA PCR  Screening     Status: None   Collection Time: 08/17/18  8:11 PM  Result Value Ref Range Status   MRSA by PCR NEGATIVE NEGATIVE Final    Comment:        The GeneXpert MRSA Assay (FDA approved for NASAL specimens only), is one component of a comprehensive MRSA colonization surveillance program. It is not intended to diagnose MRSA infection nor to guide or monitor treatment for MRSA infections. Performed at [facility], [place] [address], [place], [place] [address]     Radiology Reports Dg Chest 1 View  Result Date: 08/17/2018 CLINICAL DATA:  Right-sided thoracentesis EXAM: CHEST  1 VIEW COMPARISON:  Portable chest x-ray of 08/17/2018 FINDINGS: A right thoracentesis has been performed. No pneumothorax is seen. Mild volume loss at the left lung base remains with small left effusion. Cardiomegaly is stable. Left PICC line tip is seen to the mid lower SVC. IMPRESSION: 1. No pneumothorax after right thoracentesis. 2. Little change in opacity at the left lung base consistent with atelectasis and left effusion. Electronically Signed   By: Ivar Drape M.D.   On: 08/17/2018 16:23   Ct Angio Chest Pe W And/or Wo Contrast  Result Date: 08/17/2018 CLINICAL DATA:  Low hemoglobin with pneumonia. Dyspnea and fever. EXAM: CT ANGIOGRAPHY CHEST WITH CONTRAST TECHNIQUE: Multidetector CT imaging of the chest was performed using the standard protocol during bolus administration of intravenous contrast. Multiplanar CT image reconstructions and MIPs were obtained to evaluate the vascular anatomy. CONTRAST:  171mL ISOVUE-370 IOPAMIDOL (ISOVUE-370) INJECTION 76% COMPARISON:  08/17/2018 CXR, chest CT 06/17/2018 FINDINGS: Cardiovascular: Conventional branch pattern of the great vessels with minimal atherosclerosis at the origin of the left subclavian. No aortic aneurysm or dissection. Mild aortic atherosclerosis without aneurysm or dissection. Scattered coronary arteriosclerosis. Satisfactory  opacification of the pulmonary arteries to the proximal segmental level without pulmonary embolus. Heart size is mildly enlarged without pericardial effusion or thickening. Mediastinum/Nodes: Patent trachea and mainstem bronchi. No thyroid mass.  The CT appearance of the esophagus is unremarkable. No axillary, supraclavicular, mediastinal nor hilar adenopathy. Lungs/Pleura: New moderate to large right pleural effusion with trace to small left pleural effusion. Adjacent bibasilar and compressive atelectasis is noted. Atelectasis and/or scarring is also seen in the lingula. Respiratory motion artifact slightly limit assessment of the lung bases. No pneumothorax. No dominant mass. There appear to be scattered calcified pleural plaque along the periphery of the left hemithorax. Upper Abdomen: Cholecystectomy. 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 Increased fecal retention within the included colon. Musculoskeletal: Spondylosis of the dorsal spine. No acute nor aggressive osseous lesions. Review of the MIP images confirms the above findings. IMPRESSION: 1. No acute pulmonary embolus, aortic aneurysm or dissection. 2. New moderate to large right pleural effusion with trace to small left pleural effusion. There is adjacent compressive and subsegmental atelectasis at each lung base and lingula. Aortic Atherosclerosis (ICD10-I70.0). Electronically Signed   By: Ashley Royalty M.D.   On: 08/17/2018 14:34   Dg Chest Portable 1 View  Result Date: 08/17/2018 CLINICAL DATA:  Right-sided chest pain EXAM: PORTABLE CHEST 1 VIEW COMPARISON:  CT chest 06/17/2018 FINDINGS: Small left pleural effusion. Bilateral mild interstitial thickening no focal consolidation or pneumothorax. Stable cardiomegaly. Left-sided PICC line with the tip projecting over the SVC. Moderate osteoarthritis of the right shoulder. IMPRESSION: 1. Cardiomegaly with mild pulmonary vascular congestion. Electronically Signed   By: Kathreen Devoid    On: 08/17/2018 11:44   US Thoracentesis Asp Pleural Space W/img Guide  Result Date: 08/17/2018 INDICATION: Patient with history of pneumonia, chronic left hip infection, bilateral pleural effusions right greater than left. Request made for diagnostic and therapeutic right thoracentesis. EXAM: ULTRASOUND GUIDED DIAGNOSTIC AND THERAPEUTIC RIGHT THORACENTESIS MEDICATIONS: None COMPLICATIONS: None immediate. PROCEDURE: An ultrasound guided thoracentesis was thoroughly discussed with the patient and questions answered. The benefits, risks, alternatives and complications were also discussed. The patient understands and wishes to proceed with the procedure. Written consent was obtained. Ultrasound was performed to localize and mark an adequate pocket of fluid in the right chest. The area was then prepped and draped in the normal sterile fashion. 1% Lidocaine was used for local anesthesia. Under ultrasound guidance a 6 Fr Safe-T-Centesis catheter was introduced. Thoracentesis was performed. The catheter was removed and a dressing applied. FINDINGS: A total of approximately 830 cc of hazy, amber fluid was removed. Samples were sent to the laboratory as requested by the clinical team. IMPRESSION: Successful ultrasound guided diagnostic and therapeutic right thoracentesis yielding 830 cc of pleural fluid. Read by: Rowe Robert, PA-C Electronically Signed   By: Jacqulynn Cadet M.D.   On: 08/17/2018 16:18     CBC Recent Labs  Lab 08/17/18 1106 08/18/18 0402 08/19/18 0841  WBC 5.6 6.8 6.6  HGB 9.1* 8.1* 8.6*  HCT 30.6* 27.6* 28.9*  PLT 250 253 260  MCV 91.6 91.4 89.5  MCH 27.2 26.8 26.6  MCHC 29.7* 29.3* 29.8*  RDW 17.0* 17.0* 16.5*  LYMPHSABS 1.1  --   --   MONOABS 0.7  --   --   EOSABS 0.3  --   --   BASOSABS 0.0  --   --     Chemistries  Recent Labs  Lab 08/17/18 1058 08/17/18 1106 08/18/18 0402 08/19/18 0841  NA  --  140 140 141  K  --  3.7 4.0 3.8  CL  --  106 104 106  CO2  --  26 28  27   GLUCOSE  --  111*  87 105*  BUN  --  17 17 16   CREATININE  --  1.05 1.02 0.89  CALCIUM  --  9.0 8.4* 8.5*  MG 2.0  --   --   --   AST  --  14*  --   --   ALT  --  8  --   --   ALKPHOS  --  64  --   --   BILITOT  --  0.3  --   --    Coagulation profile Recent Labs  Lab 08/17/18 1106  INR 1.28    Cardiac Enzymes Recent Labs  Lab 08/17/18 1106  TROPONINI <0.03   ------------------------------------------------------------------------------------------------------------------    Component Value Date/Time   BNP 373.9 (H) 08/17/2018 1106    Tramane Gorum M.D on 08/20/2018 at 5:38 PM  Pager---[phone] Go to [email or web address] - [email or web address] for contact info  [facility] - Office  [phone]

## 2018-08-20 NOTE — Care Management Important Message (Signed)
Important Message  Patient Details  Name: [name] MRN: [id number] Date of Birth: Feb 01, 1946   Medicare Important Message Given:  Yes    Kerin Salen 08/20/2018, 10:44 AMImportant Message  Patient Details  Name: [name] MRN: [id number] Date of Birth: 11/16/1945   Medicare Important Message Given:  Yes    Kerin Salen 08/20/2018, 10:44 AM

## 2018-08-21 LAB — CBC
HCT: 28.4 % — ABNORMAL LOW (ref 39.0–52.0)
Hemoglobin: 8.2 g/dL — ABNORMAL LOW (ref 13.0–17.0)
MCH: 26.6 pg (ref 26.0–34.0)
MCHC: 28.9 g/dL — ABNORMAL LOW (ref 30.0–36.0)
MCV: 92.2 fL (ref 80.0–100.0)
Platelets: 246 10*3/uL (ref 150–400)
RBC: 3.08 MIL/uL — ABNORMAL LOW (ref 4.22–5.81)
RDW: 16.1 % — ABNORMAL HIGH (ref 11.5–15.5)
WBC: 6.5 10*3/uL (ref 4.0–10.5)
nRBC: 0 % (ref 0.0–0.2)

## 2018-08-21 LAB — BODY FLUID CULTURE
Culture: NO GROWTH
Special Requests: NORMAL

## 2018-08-21 MED ORDER — METRONIDAZOLE 500 MG PO TABS: 500.0000 mg | ORAL_TABLET | Freq: Two times a day (BID) | ORAL | Status: DC

## 2018-08-21 MED ORDER — METRONIDAZOLE 500 MG PO TABS
500.0000 mg | ORAL_TABLET | Freq: Two times a day (BID) | ORAL | Status: DC
  Administered 2018-08-22 – 2018-09-02 (×23): 500 mg via ORAL
  Filled 2018-08-21 (×23): qty 1

## 2018-08-21 MED ORDER — DILTIAZEM HCL ER COATED BEADS 180 MG PO CP24
180.0000 mg | ORAL_CAPSULE | Freq: Every day | ORAL | Status: DC
  Administered 2018-08-21 – 2018-09-02 (×13): 180 mg via ORAL
  Filled 2018-08-21 (×13): qty 1

## 2018-08-21 MED ORDER — METOPROLOL TARTRATE 25 MG PO TABS
25.0000 mg | ORAL_TABLET | Freq: Two times a day (BID) | ORAL | Status: DC
  Administered 2018-08-21 – 2018-09-02 (×25): 25 mg via ORAL
  Filled 2018-08-21 (×25): qty 1

## 2018-08-21 NOTE — Progress Notes (Signed)
Patient Demographics:    [name], is a 72 y.o. male, DOB - 1945-10-01, VQM:[id number]  Admit date - 08/17/2018   Admitting Physician No admitting provider for patient encounter.  Outpatient Primary MD for the patient is [name], MD  LOS - 4   Chief Complaint  Patient presents with  . Abnormal Lab        Subjective:    [name] today has no fevers, no emesis,  No chest pain,  fatigue persist, difficulties with mobility related activities of daily living persist,, dyspnea on exertion with minimal activity persist  Assessment  & Plan :    Principal Problem:   Atrial fibrillation with RVR (HCC) Active Problems:   Left hip postoperative wound infection   SIRS (systemic inflammatory response syndrome) (HCC)   Anemia of chronic disease   Depression   OSA (obstructive sleep apnea)   Obesity, Class III, BMI 40-49.9 (morbid obesity) (HCC)   Hypertensive urgency   Pleural effusion on left   Acute on chronic diastolic (congestive) heart failure (HCC)   OSA on CPAP  Brief Summary 72 y.o. male, with history of hypertension, A. 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 fib with RVR and also found to have right-sided pleural effusion, IR removed 830 mL of clear fluid, awaiting insurance approval for transfer back to SNF rehab   Plan:-  1)Chronic Atrial Fibrillation with RVR--- okay to increase Cardizem CD 180 mg daily, and metoprolol to 25 mg bid,  last known EF was 50 to 55% , continue Eliquis for anticoagulation  2)HFpEF with right-sided pleural effusion--- patient with history of chronic diastolic dysfunction CHF suspect exacerbation secondary to A. fib with RVR, improved post thoracentesis on 08/17/2018  with removal of 830 mL of clear fluid (fluid Gram stain and culture negative so far),  continue Lasix 40 mg twice daily, daily weights and strict I's and O's, supplemental oxygen as needed  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----patient usually follows with infectious disease at [facility], continue IV ertapenem through 08/21/2018, ID physician at [facility] recommends Flagyl 500 twice daily for Bacteroides fragilis keep infection starting 08/22/2018 for 90 days  4)Morbid Obesity with OSA-- c/n CPAP nightly  5)DEpression----stable,  Seroquel and Remeron were held due to concerns about QT prolongation,  continue Lexapro   6)Chronic Anemia----etiology unclear, stool is Hemoccult positive at this time  , patient on Eliquis due to history of A. Fib , hemoglobin is currently 8.2, monitor H&H closely, continue Protonix  7)Generalized Weakness/Debility--- PT eval appreciated, recommends SNF rehab awaiting insurance approval for SNF Rehab discharge ,  fatigue persist, difficulties with mobility related activities of daily living persist,, dyspnea on exertion with minimal activity persist   Disposition/Need for in-Hospital Stay- patient unable to be discharged at this time due to awaiting insurance approval for SNF rehab transfer   Code Status : Full  Disposition Plan  : SNF Rehab  DVT Prophylaxis  : Eliquis  Lab Results  Component Value Date   PLT 246 08/21/2018    Inpatient Medications  Scheduled Meds: . apixaban  5 mg Oral BID  . cholecalciferol  2,000 Units Oral QPM  . cycloSPORINE  1 drop Both Eyes BID  . diltiazem  180 mg Oral Daily  . escitalopram  10 mg Oral Daily  . ferrous sulfate  325 mg Oral BID WC  . gabapentin  100 mg Oral TID  . Gerhardt's butt cream   Topical BID  . Melatonin  6 mg Oral QHS  . metoprolol tartrate  25 mg Oral BID  . [START ON 08/22/2018] metroNIDAZOLE  500 mg Oral BID  . multivitamin with minerals  1 tablet Oral QPM    . pantoprazole  40 mg Oral Daily  . rOPINIRole  0.25 mg Oral QHS  . senna-docusate  2 tablet Oral BID  . sodium chloride flush  10-40 mL Intracatheter Q12H  . thiamine  100 mg Oral Daily   Continuous Infusions:  PRN Meds:.acetaminophen **OR** acetaminophen, calcium carbonate, hydrOXYzine, ipratropium-albuterol, menthol-cetylpyridinium, ondansetron **OR** ondansetron (ZOFRAN) IV, oxyCODONE, polyethylene glycol, polyvinyl alcohol, sodium chloride flush    Anti-infectives (From admission, onward)   Start     Dose/Rate Route Frequency Ordered Stop   08/22/18 1000  metroNIDAZOLE (FLAGYL) tablet 500 mg     500 mg Oral 2 times daily 08/21/18 1511 11/20/18 0959   08/21/18 1515  metroNIDAZOLE (FLAGYL) tablet 500 mg  Status:  Discontinued     500 mg Oral 2 times daily 08/21/18 1510 08/21/18 1511   08/17/18 1730  ertapenem (INVANZ) 1,000 mg in sodium chloride 0.9 % 100 mL IVPB  Status:  Discontinued     1 g 200 mL/hr over 30 Minutes Intravenous Every 24 hours 08/17/18 1720 08/17/18 1728   08/17/18 1200  ertapenem (INVANZ) 1,000 mg in sodium chloride 0.9 % 100 mL IVPB     1 g 200 mL/hr over 30 Minutes Intravenous Every 24 hours 08/17/18 1036 08/21/18 1308        Objective:   Vitals:   08/20/18 1347 08/20/18 2108 08/21/18 0547 08/21/18 1335  BP: 132/83 (!) 166/99 124/89 (!) 130/94  Pulse: 97 (!) 109 93 92  Resp:  18 17 20   Temp: 98.9 F (37.2 C) 98.2 F (36.8 C) 97.8 F (36.6 C) 98.8 F (37.1 C)  TempSrc: Oral Oral Oral Oral  SpO2: 94% 93% 93% 97%  Weight:      Height:        Wt Readings from Last 3 Encounters:  08/19/18 (!) 139.5 kg  04/24/18 (!) 163.6 kg  03/17/18 (!) 155.6 kg     Intake/Output Summary (Last 24 hours) at 08/21/2018 1512 Last data filed at 08/21/2018 1019 Gross per 24 hour  Intake 240 ml  Output 2250 ml  Net -2010 ml     Physical Exam Patient is examined daily including today on 08/21/18 , exams remain the same as of yesterday except that has  changed   Gen:- Awake Alert, obese HEENT:- [place].AT, No sclera icterus Neck-Supple Neck,No JVD,.  Lungs-improved air movement , no wheezing or rales CV- S1, S2 normal, irregularly irregular  abd-  +ve B.Sounds, Abd Soft, No tenderness, increased truncal adiposity    Extremity/Skin:-  pedal pulses present  Psych-affect is appropriate, oriented x3 Neuro-generalized weakness but no new focal deficits, no tremors MSK-left hip wound appears to have healed nicely  Data Review:   Micro Results Recent Results (from the past 240 hour(s))  Blood culture (routine x 2)     Status: None (Preliminary result)   Collection Time: 08/17/18 11:06 AM  Result Value Ref  Range Status   Specimen Description   Final    BLOOD RIGHT ARM Performed at [facility] [address], [place], [place] [address]    Special Requests   Final    BOTTLES DRAWN AEROBIC AND ANAEROBIC Blood Culture adequate volume Performed at [facility] [address] [place], [place] [address]    Culture   Final    NO GROWTH 4 DAYS Performed at [facility], [place] [address], [place], [place] [address]    Report Status PENDING  Incomplete  Blood culture (routine x 2)     Status: None (Preliminary result)   Collection Time: 08/17/18 12:00 PM  Result Value Ref Range Status   Specimen Description   Final    BLOOD RIGHT ANTECUBITAL Performed at [facility] [address], [place], [place] [address]    Special Requests   Final    BOTTLES DRAWN AEROBIC ONLY Blood Culture results may not be optimal due to an inadequate volume of blood received in culture bottles Performed at [facility] [address], [place], [place] [address]    Culture   Final    NO GROWTH 4 DAYS Performed at [facility], [place] [address] [place], [place] [address]    Report Status PENDING  Incomplete  Body fluid culture     Status: None   Collection Time: 08/17/18   4:47 PM  Result Value Ref Range Status   Specimen Description   Final    PLEURAL RIGHT Performed at [facility] [address], [place], [place] [address]    Special Requests   Final    Normal Performed at [facility], [place] [address], [place], [place] [address]    Gram Stain   Final    ABUNDANT WBC PRESENT,BOTH PMN AND MONONUCLEAR NO ORGANISMS SEEN    Culture   Final    NO GROWTH 3 DAYS Performed at [facility], [place] [address] [place], [place] [address]    Report Status 08/21/2018 FINAL  Final  MRSA PCR Screening     Status: None   Collection Time: 08/17/18  8:11 PM  Result Value Ref Range Status   MRSA by PCR NEGATIVE NEGATIVE Final    Comment:        The GeneXpert MRSA Assay (FDA approved for NASAL specimens only), is one component of a comprehensive MRSA colonization surveillance program. It is not intended to diagnose MRSA infection nor to guide or monitor treatment for MRSA infections. Performed at [facility], [place] [address] [place], [place] [address]     Radiology Reports Dg Chest 1 View  Result Date: 08/17/2018 CLINICAL DATA:  Right-sided thoracentesis EXAM: CHEST  1 VIEW COMPARISON:  Portable chest x-ray of 08/17/2018 FINDINGS: A right thoracentesis has been performed. No pneumothorax is seen. Mild volume loss at the left lung base remains with small left effusion. Cardiomegaly is stable. Left PICC line tip is seen to the mid lower SVC. IMPRESSION: 1. No pneumothorax after right thoracentesis. 2. Little change in opacity at the left lung base consistent with atelectasis and left effusion. Electronically Signed   By: Ivar Drape M.D.   On: 08/17/2018 16:23   Ct Angio Chest Pe W And/or Wo Contrast  Result Date: 08/17/2018 CLINICAL DATA:  Low hemoglobin with pneumonia. Dyspnea and fever. EXAM: CT ANGIOGRAPHY CHEST WITH CONTRAST TECHNIQUE: Multidetector CT imaging of the chest was performed using  the standard protocol during bolus administration of intravenous contrast. Multiplanar CT image reconstructions and MIPs were obtained to evaluate the vascular anatomy. CONTRAST:  138mL ISOVUE-370 IOPAMIDOL (ISOVUE-370) INJECTION 76% COMPARISON:  08/17/2018 CXR, chest CT 06/17/2018 FINDINGS: Cardiovascular: Conventional branch pattern of the great vessels with minimal atherosclerosis at the origin of the left subclavian. No aortic aneurysm or dissection. Mild aortic atherosclerosis without aneurysm or dissection. Scattered coronary arteriosclerosis. Satisfactory opacification of the pulmonary arteries to the proximal segmental level without pulmonary embolus. Heart size is mildly enlarged without pericardial effusion or thickening. Mediastinum/Nodes: Patent trachea and mainstem bronchi. No thyroid mass. The CT appearance of the esophagus is unremarkable. No axillary, supraclavicular, mediastinal nor hilar adenopathy. Lungs/Pleura: New moderate to large right pleural effusion with trace to small left pleural effusion. Adjacent bibasilar and compressive atelectasis is noted. Atelectasis and/or scarring is also seen in the lingula. Respiratory motion artifact slightly limit assessment of the lung bases. No pneumothorax. No dominant mass. There appear to be scattered calcified pleural plaque along the periphery of the left hemithorax. Upper Abdomen: Cholecystectomy. 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 Increased fecal retention within the included colon. Musculoskeletal: Spondylosis of the dorsal spine. No acute nor aggressive osseous lesions. Review of the MIP images confirms the above findings. IMPRESSION: 1. No acute pulmonary embolus, aortic aneurysm or dissection. 2. New moderate to large right pleural effusion with trace to small left pleural effusion. There is adjacent compressive and subsegmental atelectasis at each lung base and lingula. Aortic Atherosclerosis (ICD10-I70.0). Electronically  Signed   By: Ashley Royalty M.D.   On: 08/17/2018 14:34   Dg Chest Portable 1 View  Result Date: 08/17/2018 CLINICAL DATA:  Right-sided chest pain EXAM: PORTABLE CHEST 1 VIEW COMPARISON:  CT chest 06/17/2018 FINDINGS: Small left pleural effusion. Bilateral mild interstitial thickening no focal consolidation or pneumothorax. Stable cardiomegaly. Left-sided PICC line with the tip projecting over the SVC. Moderate osteoarthritis of the right shoulder. IMPRESSION: 1. Cardiomegaly with mild pulmonary vascular congestion. Electronically Signed   By: Kathreen Devoid   On: 08/17/2018 11:44   US Thoracentesis Asp Pleural Space W/img Guide  Result Date: 08/17/2018 INDICATION: Patient with history of pneumonia, chronic left hip infection, bilateral pleural effusions right greater than left. Request made for diagnostic and therapeutic right thoracentesis. EXAM: ULTRASOUND GUIDED DIAGNOSTIC AND THERAPEUTIC RIGHT THORACENTESIS MEDICATIONS: None COMPLICATIONS: None immediate. PROCEDURE: An ultrasound guided thoracentesis was thoroughly discussed with the patient and questions answered. The benefits, risks, alternatives and complications were also discussed. The patient understands and wishes to proceed with the procedure. Written consent was obtained. Ultrasound was performed to localize and mark an adequate pocket of fluid in the right chest. The area was then prepped and draped in the normal sterile fashion. 1% Lidocaine was used for local anesthesia. Under ultrasound guidance a 6 Fr Safe-T-Centesis catheter was introduced. Thoracentesis was performed. The catheter was removed and a dressing applied. FINDINGS: A total of approximately 830 cc of hazy, amber fluid was removed. Samples were sent to the laboratory as requested by the clinical team. IMPRESSION: Successful ultrasound guided diagnostic and therapeutic right thoracentesis yielding 830 cc of pleural fluid. Read by: Rowe Robert, PA-C Electronically Signed   By: Jacqulynn Cadet M.D.   On: 08/17/2018 16:18     CBC Recent Labs  Lab 08/17/18 1106 08/18/18 0402 08/19/18 0841 08/21/18 0433  WBC 5.6 6.8 6.6 6.5  HGB 9.1* 8.1* 8.6* 8.2*  HCT 30.6* 27.6* 28.9* 28.4*  PLT 250 253 260 246  MCV 91.6 91.4 89.5 92.2  MCH 27.2 26.8 26.6 26.6  MCHC 29.7* 29.3* 29.8* 28.9*  RDW 17.0* 17.0* 16.5* 16.1*  LYMPHSABS 1.1  --   --   --   MONOABS 0.7  --   --   --   EOSABS 0.3  --   --   --   BASOSABS 0.0  --   --   --     Chemistries  Recent Labs  Lab 08/17/18 1058 08/17/18 1106 08/18/18 0402 08/19/18 0841  NA  --  140 140 141  K  --  3.7 4.0 3.8  CL  --  106 104 106  CO2  --  26 28 27   GLUCOSE  --  111* 87 105*  BUN  --  17 17 16   CREATININE  --  1.05 1.02 0.89  CALCIUM  --  9.0 8.4* 8.5*  MG 2.0  --   --   --   AST  --  14*  --   --   ALT  --  8  --   --   ALKPHOS  --  64  --   --   BILITOT  --  0.3  --   --    Coagulation profile Recent Labs  Lab 08/17/18 1106  INR 1.28    Cardiac Enzymes Recent Labs  Lab 08/17/18 1106  TROPONINI <0.03   ------------------------------------------------------------------------------------------------------------------    Component Value Date/Time   BNP 373.9 (H) 08/17/2018 1106    Artis Buechele M.D on 08/21/2018 at 3:12 PM  Pager---[phone] Go to [email or web address] - password [email or web address] for contact info  [facility] - Office  [phone]

## 2018-08-21 NOTE — Plan of Care (Signed)
Patient medicated for chronic pain in back and shoulders x 2 this shift with some improvement.  Patient depressed, states he has lost his best friend in the last month and is estranged from his daughter.  Chaplain did come speak to the patient regarding the above.  Several friends and his brother came to visit this shift.

## 2018-08-21 NOTE — Progress Notes (Signed)
[name] was lying in bed, awake and alert when I arrived. He talked his family, a son who lives in [place] and a daughter who lives across the street from him. He said they do not have a relationship, he does not get to see his grandchildren, he is lonely and they do not come see him in the rehab facilities. He said his daughter said she does not want to bring her son to that place with old people (speaking of the rehab facility). He said his son, who is 92, said he was upset because he did not play with him when he was a child. His daughter said that she was upset because he did not attend her high school graduation. His daughter is in her late 47's. Pt explained how he worked two jobs when they were growing up and could not be there as much in order to make sure they were fed. He said he sometimes wishes he had not survived a previous infection. He said, "I don't feel like hurting myself," but he expressed how he feels given how things are with his children. He talked about wishing to see his grandparents. We talked about the possibility of him writing a letter to his children in order to express his feelings. We talked about the fact that they may or may not respond favorably or if at all. The letter would allow him to express what he has not been able to articulate to them. He liked the idea even though he admitted he has not written a letter in many years. Pt said he would just like five minutes with his children that that would do a world of good. He wishes to, and said he will, express that to his children. Pt said he is depressed because of his estranged family. Chaplain provided empathic support and presence. Please page if additional support is needed. [place], [place]   08/21/18 1800  Clinical Encounter Type  Visited With Patient

## 2018-08-21 NOTE — Progress Notes (Addendum)
Patient ambulatory in room, got up on right side of bed and walked around to left side of room to sit in bariatric chair.  2 assist to get out of bed, used walker with standby assist to chair.   Patient sat up for approximately one hour, called staff stating he was in significant pain in his back and needed to get back to bed.  2 staff members to assist patient to stand, patient able only to stand and take a couple of steps back to nearest side of bed with walker and gait belt.

## 2018-08-22 DIAGNOSIS — T8149XA Infection following a procedure, other surgical site, initial encounter: Secondary | ICD-10-CM

## 2018-08-22 LAB — BASIC METABOLIC PANEL
Anion gap: 7 (ref 5–15)
BUN: 15 mg/dL (ref 8–23)
CO2: 28 mmol/L (ref 22–32)
Calcium: 8.6 mg/dL — ABNORMAL LOW (ref 8.9–10.3)
Chloride: 108 mmol/L (ref 98–111)
Creatinine, Ser: 0.84 mg/dL (ref 0.61–1.24)
GFR calc Af Amer: >60 mL/min (ref >60)
GFR calc non Af Amer: >60 mL/min (ref >60)
Glucose, Bld: 102 mg/dL — ABNORMAL HIGH (ref 70–99)
Potassium: 4.1 mmol/L (ref 3.5–5.1)
Sodium: 143 mmol/L (ref 135–145)

## 2018-08-22 LAB — CBC
HCT: 28.5 % — ABNORMAL LOW (ref 39.0–52.0)
Hemoglobin: 8.1 g/dL — ABNORMAL LOW (ref 13.0–17.0)
MCH: 26.6 pg (ref 26.0–34.0)
MCHC: 28.4 g/dL — ABNORMAL LOW (ref 30.0–36.0)
MCV: 93.8 fL (ref 80.0–100.0)
Platelets: 265 10*3/uL (ref 150–400)
RBC: 3.04 MIL/uL — ABNORMAL LOW (ref 4.22–5.81)
RDW: 16.4 % — ABNORMAL HIGH (ref 11.5–15.5)
WBC: 6.3 10*3/uL (ref 4.0–10.5)
nRBC: 0 % (ref 0.0–0.2)

## 2018-08-22 LAB — CULTURE, BLOOD (ROUTINE X 2)
Culture: NO GROWTH
Culture: NO GROWTH
Special Requests: ADEQUATE

## 2018-08-22 MED ORDER — OXYCODONE HCL 5 MG PO TABS
5.0000 mg | ORAL_TABLET | Freq: Four times a day (QID) | ORAL | Status: DC | PRN
  Administered 2018-08-22: 5 mg via ORAL
  Administered 2018-08-23 (×2): 10 mg via ORAL
  Administered 2018-08-24: 5 mg via ORAL
  Administered 2018-08-24: 10 mg via ORAL
  Administered 2018-08-24: 5 mg via ORAL
  Administered 2018-08-25 – 2018-09-02 (×22): 10 mg via ORAL
  Filled 2018-08-22 (×2): qty 2
  Filled 2018-08-22: qty 1
  Filled 2018-08-22 (×9): qty 2
  Filled 2018-08-22: qty 1
  Filled 2018-08-22 (×10): qty 2
  Filled 2018-08-22: qty 1
  Filled 2018-08-22 (×5): qty 2

## 2018-08-22 NOTE — Progress Notes (Signed)
Pt was lying in bed, awake and alert when I arrived. He said his daughter and son are supposed to visit tonight or tomorrow.  He was very hopeful for the visit. He said he is going to hold off writing a letter until he sees what happens. I offered continued support.  Please page if needed. [place], [place]   08/22/18 1700  Clinical Encounter Type  Visited With Patient

## 2018-08-22 NOTE — Progress Notes (Signed)
PROGRESS NOTE        PATIENT DETAILS Name: [name] Age: 72 y.o. Sex: male Date of Birth: Nov 04, 1945 Admit Date: 08/17/2018 Admitting Physician No admitting provider for patient encounter. BTD:VVOHYW, Hollice Espy, MD  Brief Narrative: 72 y.o.male,with history of hypertension, A. 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 fib with RVR and also found to have right-sided pleural effusion, IR removed 830 mL of clear fluid, awaiting insurance approval for transfer back to SNF rehab  Subjective: Lying comfortably in bed-no chest pain or shortness of breath.  Heart rate well controlled this morning.   Assessment/Plan:  Atrial Fibrillation with RVR: Rate controlled-continue Cardizem and metoprolol.  Anticoagulated with Eliquis.  Continue telemetry monitoring.   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  Underwent thoracocentesis on 12/17  Right-sided pleural effusion: Thoracocentesis on 12/17-pleural fluid appears to be exudative by light's criteria.  Pleural fluid cytology negative for malignancy- pleural fluid culture was negative as well.  CT angiogram without any obvious parenchymal lesion-but may not be a good/reliable exam due to large pleural effusion.  Will need further evaluation in the outpatient setting.   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 Courage spoke with ID physician at [facility] recommends Flagyl 500 twice daily for Bacteroides fragilis keep infection starting 08/22/2018 for 90 days  Morbid Obesity with OSA: Continue CPAP nightly.  Depression: Stable-continue Lexapro.   Seroquel and Remeron remain on hold-resume when able.  Chronic normocytic anemia: No evidence of overt blood loss-although FOBT on 12/17+.  I suspect patient has anemia of chronic disease-could have worsened due to acute illness.  No evidence of overt blood loss-cautiously continue with Eliquis.   Generalized Weakness/Debility: PT eval appreciated, recommends SNF rehab awaiting insurance approval for SNF Rehab discharge.  Patient appears to be more deconditioned and debilitated than his usual baseline.  He claims that he has severe fatigue, exertional dyspnea with activity  DVT Prophylaxis: Full dose anticoagulation with Eliquis  Code Status: Full code   Family Communication: None at bedside  Disposition Plan: Remain inpatient- SNF on discharge  Antimicrobial agents: Anti-infectives (From admission, onward)   Start     Dose/Rate Route Frequency Ordered Stop   08/22/18 1000  metroNIDAZOLE (FLAGYL) tablet 500 mg     500 mg Oral 2 times daily 08/21/18 1511 11/20/18 0959   08/21/18 1515  metroNIDAZOLE (FLAGYL) tablet 500 mg  Status:  Discontinued     500 mg Oral 2 times daily 08/21/18 1510 08/21/18 1511   08/17/18 1730  ertapenem (INVANZ) 1,000 mg in sodium chloride 0.9 % 100 mL IVPB  Status:  Discontinued     1 g 200 mL/hr over 30 Minutes Intravenous Every 24 hours 08/17/18 1720 08/17/18 1728   08/17/18 1200  ertapenem (INVANZ) 1,000 mg in sodium chloride 0.9 % 100 mL IVPB     1 g 200 mL/hr over 30 Minutes Intravenous Every 24 hours 08/17/18 1036 08/21/18 1308      Procedures: 12/17>> thoracentesis  CONSULTS:  None  Time spent: 25- minutes-Greater than 50% of this time was spent in  counseling, explanation of diagnosis, planning of further management, and coordination of care.  MEDICATIONS: Scheduled Meds: . apixaban  5 mg Oral BID  . cholecalciferol  2,000 Units Oral QPM  . cycloSPORINE  1 drop Both Eyes BID  . diltiazem  180 mg Oral Daily  . escitalopram  10 mg Oral  Daily  . ferrous sulfate  325 mg Oral BID WC  . gabapentin  100 mg Oral TID  . Gerhardt's butt cream   Topical BID  . Melatonin  6 mg Oral QHS  . metoprolol tartrate  25 mg Oral BID  . metroNIDAZOLE  500 mg Oral BID  . multivitamin with minerals  1 tablet Oral QPM  . pantoprazole  40 mg Oral Daily  . rOPINIRole  0.25 mg Oral QHS  . senna-docusate  2 tablet Oral BID  . sodium chloride flush  10-40 mL Intracatheter Q12H  . thiamine  100 mg Oral Daily   Continuous Infusions: PRN Meds:.acetaminophen **OR** acetaminophen, calcium carbonate, hydrOXYzine, ipratropium-albuterol, menthol-cetylpyridinium, ondansetron **OR** ondansetron (ZOFRAN) IV, oxyCODONE, polyethylene glycol, polyvinyl alcohol, sodium chloride flush   PHYSICAL EXAM: Vital signs: Vitals:   08/21/18 0547 08/21/18 1335 08/21/18 2045 08/22/18 0516  BP: 124/89 (!) 130/94 122/78 128/89  Pulse: 93 92 (!) 107 88  Resp: 17 20 18 12   Temp: 97.8 F (36.6 C) 98.8 F (37.1 C) 99 F (37.2 C) (!) 97.4 F (36.3 C)  TempSrc: Oral Oral Oral Oral  SpO2: 93% 97% 95% 95%  Weight:    111.6 kg  Height:       Filed Weights   08/18/18 0500 08/19/18 0520 08/22/18 0516  Weight: (!) 139.7 kg (!) 139.5 kg 111.6 kg   Body mass index is 37.4 kg/m.   General appearance :Awake, alert, not in any distress.  Eyes:.Pink conjunctiva HEENT: Atraumatic and Normocephalic Neck: supple Resp:Good air entry bilaterally, no added sounds  CVS: S1 S2 regular, no murmurs.  GI: Bowel sounds present, Non tender and not distended with no gaurding, rigidity or rebound.No organomegaly Extremities: B/L Lower Ext shows no edema, both legs are warm to touch Neurology:  speech clear,Non focal, sensation is grossly intact. Musculoskeletal:No digital cyanosis Skin:No Rash, warm and dry Wounds:N/A  I have personally reviewed following labs and imaging studies  LABORATORY DATA: CBC: Recent Labs  Lab 08/17/18 1106 08/18/18 0402 08/19/18 0841  08/21/18 0433 08/22/18 0403  WBC 5.6 6.8 6.6 6.5 6.3  NEUTROABS 3.4  --   --   --   --   HGB 9.1* 8.1* 8.6* 8.2* 8.1*  HCT 30.6* 27.6* 28.9* 28.4* 28.5*  MCV 91.6 91.4 89.5 92.2 93.8  PLT 250 253 260 246 417    Basic Metabolic Panel: Recent Labs  Lab 08/17/18 1058 08/17/18 1106 08/18/18 0402 08/19/18 0841 08/22/18 0403  NA  --  140 140 141 143  K  --  3.7 4.0 3.8 4.1  CL  --  106 104 106 108  CO2  --  26 28 27 28   GLUCOSE  --  111* 87 105* 102*  BUN  --  17 17 16 15   CREATININE  --  1.05 1.02 0.89 0.84  CALCIUM  --  9.0 8.4* 8.5* 8.6*  MG 2.0  --   --   --   --     GFR: Estimated Creatinine Clearance: 96.4 mL/min (by C-G formula based on SCr of 0.84 mg/dL).  Liver Function Tests: Recent Labs  Lab 08/17/18 1106  AST 14*  ALT 8  ALKPHOS 64  BILITOT 0.3  PROT 6.1*  ALBUMIN 2.8*   No results for input(s): LIPASE, AMYLASE in the last 168 hours. No results for input(s): AMMONIA in the last 168 hours.  Coagulation Profile: Recent Labs  Lab 08/17/18 1106  INR 1.28    Cardiac Enzymes: Recent Labs  Lab 08/17/18 1106  TROPONINI <0.03    BNP (last 3 results) No results for input(s): PROBNP in the last 8760 hours.  HbA1C: No results for input(s): HGBA1C in the last 72 hours.  CBG: No results for input(s): GLUCAP in the last 168 hours.  Lipid Profile: No results for input(s): CHOL, HDL, LDLCALC, TRIG, CHOLHDL, LDLDIRECT in the last 72 hours.  Thyroid Function Tests: No results for input(s): TSH, T4TOTAL, FREET4, T3FREE, THYROIDAB in the last 72 hours.  Anemia Panel: No results for input(s): VITAMINB12, FOLATE, FERRITIN, TIBC, IRON, RETICCTPCT in the last 72 hours.  Urine analysis:    Component Value Date/Time   COLORURINE YELLOW 08/17/2018 1035   APPEARANCEUR CLEAR 08/17/2018 1035   APPEARANCEUR Clear 01/18/2018 1539   LABSPEC 1.011 08/17/2018 1035   PHURINE 6.0 08/17/2018 1035   GLUCOSEU NEGATIVE 08/17/2018 1035   HGBUR NEGATIVE 08/17/2018  1035   BILIRUBINUR NEGATIVE 08/17/2018 1035   BILIRUBINUR Negative 01/18/2018 1539   KETONESUR NEGATIVE 08/17/2018 1035   PROTEINUR NEGATIVE 08/17/2018 1035   UROBILINOGEN 0.2 09/06/2012 0213   NITRITE NEGATIVE 08/17/2018 1035   LEUKOCYTESUR NEGATIVE 08/17/2018 1035   LEUKOCYTESUR Negative 01/18/2018 1539    Sepsis Labs: Lactic Acid, Venous    Component Value Date/Time   LATICACIDVEN 0.83 08/17/2018 1202    MICROBIOLOGY: Recent Results (from the past 240 hour(s))  Blood culture (routine x 2)     Status: None   Collection Time: 08/17/18 11:06 AM  Result Value Ref Range Status   Specimen Description   Final    BLOOD RIGHT ARM Performed at [facility] [address], [place], [place] [address]    Special Requests   Final    BOTTLES DRAWN AEROBIC AND ANAEROBIC Blood Culture adequate volume Performed at [facility] [address], [place], [place] [address]    Culture   Final    NO GROWTH 5 DAYS Performed at [facility], [place] [address], [place], [place] [address]    Report Status 08/22/2018 FINAL  Final  Blood culture (routine x 2)     Status: None   Collection Time: 08/17/18 12:00 PM  Result Value Ref Range Status   Specimen Description   Final    BLOOD RIGHT ANTECUBITAL Performed at [facility] [address], [place], [place] [address]    Special Requests   Final    BOTTLES DRAWN AEROBIC ONLY Blood Culture results may not be optimal due to an inadequate volume of blood received in culture bottles Performed at [facility] [address], [place], [place] [address]    Culture   Final    NO GROWTH 5 DAYS Performed at [facility], [place] [address], [place],  [address]    Report Status 08/22/2018 FINAL  Final  Body fluid culture     Status: None   Collection Time: 08/17/18  4:47 PM  Result Value Ref Range Status   Specimen Description   Final    PLEURAL  RIGHT Performed at [facility] [address] [place],  [address]    Special Requests   Final  Normal Performed at [facility], [place] [address], [place], [place] [address]    Gram Stain   Final    ABUNDANT WBC PRESENT,BOTH PMN AND MONONUCLEAR NO ORGANISMS SEEN    Culture   Final    NO GROWTH 3 DAYS Performed at [facility], [place] [address] [place], [place] [address]    Report Status 08/21/2018 FINAL  Final  MRSA PCR Screening     Status: None   Collection Time: 08/17/18  8:11 PM  Result Value Ref Range Status   MRSA by PCR NEGATIVE NEGATIVE Final    Comment:        The GeneXpert MRSA Assay (FDA approved for NASAL specimens only), is one component of a comprehensive MRSA colonization surveillance program. It is not intended to diagnose MRSA infection nor to guide or monitor treatment for MRSA infections. Performed at [facility], [place] [address], [place], [place] [address]     RADIOLOGY STUDIES/RESULTS: Dg Chest 1 View  Result Date: 08/17/2018 CLINICAL DATA:  Right-sided thoracentesis EXAM: CHEST  1 VIEW COMPARISON:  Portable chest x-ray of 08/17/2018 FINDINGS: A right thoracentesis has been performed. No pneumothorax is seen. Mild volume loss at the left lung base remains with small left effusion. Cardiomegaly is stable. Left PICC line tip is seen to the mid lower SVC. IMPRESSION: 1. No pneumothorax after right thoracentesis. 2. Little change in opacity at the left lung base consistent with atelectasis and left effusion. Electronically Signed   By: Ivar Drape M.D.   On: 08/17/2018 16:23   Ct Angio Chest Pe W And/or Wo Contrast  Result Date: 08/17/2018 CLINICAL DATA:  Low hemoglobin with pneumonia. Dyspnea and fever. EXAM: CT ANGIOGRAPHY CHEST WITH CONTRAST TECHNIQUE: Multidetector CT imaging of the chest was performed using the standard protocol during bolus administration of intravenous contrast.  Multiplanar CT image reconstructions and MIPs were obtained to evaluate the vascular anatomy. CONTRAST:  145mL ISOVUE-370 IOPAMIDOL (ISOVUE-370) INJECTION 76% COMPARISON:  08/17/2018 CXR, chest CT 06/17/2018 FINDINGS: Cardiovascular: Conventional branch pattern of the great vessels with minimal atherosclerosis at the origin of the left subclavian. No aortic aneurysm or dissection. Mild aortic atherosclerosis without aneurysm or dissection. Scattered coronary arteriosclerosis. Satisfactory opacification of the pulmonary arteries to the proximal segmental level without pulmonary embolus. Heart size is mildly enlarged without pericardial effusion or thickening. Mediastinum/Nodes: Patent trachea and mainstem bronchi. No thyroid mass. The CT appearance of the esophagus is unremarkable. No axillary, supraclavicular, mediastinal nor hilar adenopathy. Lungs/Pleura: New moderate to large right pleural effusion with trace to small left pleural effusion. Adjacent bibasilar and compressive atelectasis is noted. Atelectasis and/or scarring is also seen in the lingula. Respiratory motion artifact slightly limit assessment of the lung bases. No pneumothorax. No dominant mass. There appear to be scattered calcified pleural plaque along the periphery of the left hemithorax. Upper Abdomen: Cholecystectomy. 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 Increased fecal retention within the included colon. Musculoskeletal: Spondylosis of the dorsal spine. No acute nor aggressive osseous lesions. Review of the MIP images confirms the above findings. IMPRESSION: 1. No acute pulmonary embolus, aortic aneurysm or dissection. 2. New moderate to large right pleural effusion with trace to small left pleural effusion. There is adjacent compressive and subsegmental atelectasis at each lung base and lingula. Aortic Atherosclerosis (ICD10-I70.0). Electronically Signed   By: Ashley Royalty M.D.   On: 08/17/2018 14:34   Dg Chest Portable  1 View  Result Date: 08/17/2018 CLINICAL DATA:  Right-sided chest  pain EXAM: PORTABLE CHEST 1 VIEW COMPARISON:  CT chest 06/17/2018 FINDINGS: Small left pleural effusion. Bilateral mild interstitial thickening no focal consolidation or pneumothorax. Stable cardiomegaly. Left-sided PICC line with the tip projecting over the SVC. Moderate osteoarthritis of the right shoulder. IMPRESSION: 1. Cardiomegaly with mild pulmonary vascular congestion. Electronically Signed   By: Kathreen Devoid   On: 08/17/2018 11:44   US Thoracentesis Asp Pleural Space W/img Guide  Result Date: 08/17/2018 INDICATION: Patient with history of pneumonia, chronic left hip infection, bilateral pleural effusions right greater than left. Request made for diagnostic and therapeutic right thoracentesis. EXAM: ULTRASOUND GUIDED DIAGNOSTIC AND THERAPEUTIC RIGHT THORACENTESIS MEDICATIONS: None COMPLICATIONS: None immediate. PROCEDURE: An ultrasound guided thoracentesis was thoroughly discussed with the patient and questions answered. The benefits, risks, alternatives and complications were also discussed. The patient understands and wishes to proceed with the procedure. Written consent was obtained. Ultrasound was performed to localize and mark an adequate pocket of fluid in the right chest. The area was then prepped and draped in the normal sterile fashion. 1% Lidocaine was used for local anesthesia. Under ultrasound guidance a 6 Fr Safe-T-Centesis catheter was introduced. Thoracentesis was performed. The catheter was removed and a dressing applied. FINDINGS: A total of approximately 830 cc of hazy, amber fluid was removed. Samples were sent to the laboratory as requested by the clinical team. IMPRESSION: Successful ultrasound guided diagnostic and therapeutic right thoracentesis yielding 830 cc of pleural fluid. Read by: Rowe Robert, PA-C Electronically Signed   By: Jacqulynn Cadet M.D.   On: 08/17/2018 16:18     LOS: 5 days   Oren Binet, MD  [facility]  If 7PM-7AM, please contact night-coverage  Please page via [email or web address]-Password [email or web address]-click on MD name and type text message  08/22/2018, 11:14 AM

## 2018-08-22 NOTE — Progress Notes (Signed)
Patient up to chair with 2 assist, after sitting in chair for approximately 30-45 minutes patient c/o severe back pain and had significant difficulty standing with 2 max assist and pivoting back to bed.  MD notified of pain, new orders received.

## 2018-08-22 NOTE — Plan of Care (Signed)
Patient stable during 7 a to 7 p shift.  After sitting in chair patient stated his right lung was hurting, no respiratory distress, oxygen saturation remained in the mid to high 90's on room air.  Additional pain medication administered as per order.  Will continue to monitor.

## 2018-08-23 MED ORDER — PHENOL 1.4 % MT LIQD
1.0000 | OROMUCOSAL | Status: DC | PRN
  Filled 2018-08-23: qty 177

## 2018-08-23 NOTE — Care Management Important Message (Signed)
Important Message  Patient Details  Name: [name] MRN: [id number] Date of Birth: 1945-09-21   Medicare Important Message Given:  Yes    Kerin Salen 08/23/2018, 10:59 AMImportant Message  Patient Details  Name: [name] MRN: [id number] Date of Birth: 1946/02/17   Medicare Important Message Given:  Yes    Kerin Salen 08/23/2018, 10:59 AM

## 2018-08-23 NOTE — Progress Notes (Signed)
Physical Therapy Treatment Patient Details Name: [name] MRN: [id number] DOB: 04-13-1946 Today's Date: 08/23/2018    History of Present Illness 72 y.o. male, with history of hypertension, A. fib on Eliquis, giant cell arteritis, OSA on CPAP, left total hip arthroplasty, L hip infection, anxiety, depression, morbid obesity; recent prolonged hospital stay at [facility] with  altered mental status, respiratory distress requiring intubation, was then transferred to [facility]  extubated at [facility] where he had further prolonged stay with delirium, had a fall from bed injuring his left hip with x-ray showing new linear linear peri-prosthetic fracture; admitted to [facility] on 08/17/18 with fatigue and cough, was found to be in afib in ED; underwent thoracentesis 12/17      PT Comments    Pt assisted OOB and ambulated within room however pt limited ambulation distance due to right flank pain ("lung").  Pt repositioned in bari recliner end of session.  Follow Up Recommendations  SNF     Equipment Recommendations  None recommended by PT    Recommendations for Other Services       Precautions / Restrictions Precautions Precautions: Fall    Mobility  Bed Mobility Overal bed mobility: Needs Assistance Bed Mobility: Supine to Sit     Supine to sit: Min assist;HOB elevated     General bed mobility comments: heavy use of rail to come to sit, pt able to move LEs over EOB however required assist for trunk likely due to Right flank pain  Transfers Overall transfer level: Needs assistance Equipment used: Rolling walker (2 wheeled) Transfers: Sit to/from Stand Sit to Stand: Min assist;+2 safety/equipment;From elevated surface         General transfer comment: verbal cues for UE and LE positioning, assist to rise and steady, cues for extension after forward flexion  Ambulation/Gait Ambulation/Gait assistance: Min guard;+2 safety/equipment Gait Distance (Feet): 10  Feet Assistive device: Rolling walker (2 wheeled) Gait Pattern/deviations: Step-to pattern;Wide base of support     General Gait Details: verbal cues for RW position, posture, provided encouragement however pt limited by Right flank pain and requested recliner   Stairs             Wheelchair Mobility    Modified Rankin (Stroke Patients Only)       Balance                                            Cognition Arousal/Alertness: Awake/alert Behavior During Therapy: WFL for tasks assessed/performed Overall Cognitive Status: Within Functional Limits for tasks assessed                                        Exercises      General Comments        Pertinent Vitals/Pain Pain Assessment: Faces Faces Pain Scale: Hurts even more Pain Location: right flank (pt feels it's his lung) Pain Intervention(s): Repositioned;Monitored during session;Limited activity within patient's tolerance(RN in another pt's room, requested secretary notify RN of request for pain meds when available)    Home Living                      Prior Function            PT Goals (current goals can now be  found in the care plan section) Progress towards PT goals: Progressing toward goals    Frequency    Min 2X/week      PT Plan Current plan remains appropriate    Co-evaluation              AM-PAC PT "6 Clicks" Mobility   Outcome Measure  Help needed turning from your back to your side while in a flat bed without using bedrails?: A Lot Help needed moving from lying on your back to sitting on the side of a flat bed without using bedrails?: A Lot Help needed moving to and from a bed to a chair (including a wheelchair)?: A Lot Help needed standing up from a chair using your arms (e.g., wheelchair or bedside chair)?: A Lot Help needed to walk in hospital room?: A Lot Help needed climbing 3-5 steps with a railing? : Total 6 Click Score: 11     End of Session Equipment Utilized During Treatment: Gait belt Activity Tolerance: Patient tolerated treatment well Patient left: with call bell/phone within reach;in chair   PT Visit Diagnosis: Other abnormalities of gait and mobility (R26.89);Muscle weakness (generalized) (M62.81)     Time: 3567-0141 PT Time Calculation (min) (ACUTE ONLY): 18 min  Charges:  $Gait Training: 8-22 mins                     Carmelia Bake, PT, DPT Acute Rehabilitation Services Office: [phone] Pager: [phone]  Trena Platt 08/23/2018, 2:25 PM

## 2018-08-23 NOTE — Progress Notes (Signed)
Patient Demographics:    [name], is a 72 y.o. male, DOB - [birth year], YQM:[id number]  Admit date - 08/17/2018   Admitting Physician No admitting provider for patient encounter.  Outpatient Primary MD for the patient is [name], MD  LOS - 6   Chief Complaint  Patient presents with  . Abnormal Lab        Subjective:    [name] today has no fevers, no emesis,  No chest pain,  fatigue persist, difficulties with mobility related activities of daily living persist,, dyspnea on exertion with minimal activity persist  Assessment  & Plan :    Principal Problem:   Atrial fibrillation with RVR (HCC) Active Problems:   Left hip postoperative wound infection   SIRS (systemic inflammatory response syndrome) (HCC)   Anemia of chronic disease   Depression   OSA (obstructive sleep apnea)   Obesity, Class III, BMI 40-49.9 (morbid obesity) (HCC)   Hypertensive urgency   Pleural effusion on left   Acute on chronic diastolic (congestive) heart failure (HCC)   OSA on CPAP  Brief Summary 72 y.o. male, with history of hypertension, A. 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 fib with RVR and also found to have right-sided pleural effusion, IR removed 830 mL of clear fluid, awaiting insurance approval for transfer back to SNF rehab   Plan:- 1)Chronic Atrial Fibrillation with RVR--- improved rate control, continue Cardizem CD 180 mg daily, and metoprolol  25 mg bid,  last known EF was 50 to 55% , continue Eliquis for anticoagulation  2)HFpEF with right-sided pleural effusion--- patient with history of chronic diastolic dysfunction CHF suspect exacerbation secondary to A. fib with RVR, improved post thoracentesis on  08/17/2018 with removal of 830 mL of clear fluid (fluid Gram stain and culture negative so far), fluid LDH 470 serum LDH 146, exudative effusion by Light's criteria, d/w PCCM attending Dr. Nelda Marseille, official pulmonology consult pending,  continue Lasix 40 mg twice daily, daily weights and strict I's and O's, supplemental oxygen as needed  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----patient usually follows with infectious disease at [facility], continue IV ertapenem through 08/21/2018, ID physician at [facility] recommends Flagyl 500 twice daily for Bacteroides fragilis keep infection starting 08/22/2018 for 90 days  4)Morbid Obesity with OSA-- c/n CPAP nightly  5)DEpression----stable,  Seroquel and Remeron were held due to concerns about QT prolongation,  continue Lexapro   6)Chronic Anemia----etiology unclear, stool is Hemoccult positive at this time  , patient on Eliquis due to history of A. Fib , hemoglobin is currently 8.2, monitor H&H closely, continue Protonix  7)Generalized Weakness/Debility--- PT eval appreciated, recommends SNF rehab awaiting insurance approval for SNF Rehab discharge ,  fatigue persist, difficulties with mobility related activities of daily living persist,, dyspnea on exertion with minimal activity persist   Disposition/Need for in-Hospital Stay- patient unable to be discharged at this time due to awaiting insurance approval for SNF rehab transfer   Code Status : Full  Disposition Plan  : SNF Rehab  DVT Prophylaxis  : Eliquis  Lab  Results  Component Value Date   PLT 265 08/22/2018    Inpatient Medications  Scheduled Meds: . apixaban  5 mg Oral BID  . cholecalciferol  2,000 Units Oral QPM  . cycloSPORINE  1 drop Both Eyes BID  . diltiazem  180 mg Oral Daily  . escitalopram  10 mg Oral Daily  . ferrous sulfate  325 mg Oral BID WC  . gabapentin  100 mg Oral TID  . Gerhardt's butt cream   Topical BID  . Melatonin  6 mg Oral  QHS  . metoprolol tartrate  25 mg Oral BID  . metroNIDAZOLE  500 mg Oral BID  . multivitamin with minerals  1 tablet Oral QPM  . pantoprazole  40 mg Oral Daily  . rOPINIRole  0.25 mg Oral QHS  . senna-docusate  2 tablet Oral BID  . sodium chloride flush  10-40 mL Intracatheter Q12H  . thiamine  100 mg Oral Daily   Continuous Infusions:  PRN Meds:.acetaminophen **OR** acetaminophen, calcium carbonate, hydrOXYzine, ipratropium-albuterol, menthol-cetylpyridinium, ondansetron **OR** ondansetron (ZOFRAN) IV, oxyCODONE, phenol, polyethylene glycol, polyvinyl alcohol, sodium chloride flush    Anti-infectives (From admission, onward)   Start     Dose/Rate Route Frequency Ordered Stop   08/22/18 1000  metroNIDAZOLE (FLAGYL) tablet 500 mg     500 mg Oral 2 times daily 08/21/18 1511 11/20/18 0959   08/21/18 1515  metroNIDAZOLE (FLAGYL) tablet 500 mg  Status:  Discontinued     500 mg Oral 2 times daily 08/21/18 1510 08/21/18 1511   08/17/18 1730  ertapenem (INVANZ) 1,000 mg in sodium chloride 0.9 % 100 mL IVPB  Status:  Discontinued     1 g 200 mL/hr over 30 Minutes Intravenous Every 24 hours 08/17/18 1720 08/17/18 1728   08/17/18 1200  ertapenem (INVANZ) 1,000 mg in sodium chloride 0.9 % 100 mL IVPB     1 g 200 mL/hr over 30 Minutes Intravenous Every 24 hours 08/17/18 1036 08/21/18 1308        Objective:   Vitals:   08/22/18 1322 08/22/18 2035 08/23/18 0501 08/23/18 1301  BP: 123/79 126/75 134/71 104/69  Pulse: 90 (!) 101 95 87  Resp: 18 16 12 18   Temp: 99.4 F (37.4 C) 98.8 F (37.1 C) 97.8 F (36.6 C) 99 F (37.2 C)  TempSrc: Oral Oral Oral Oral  SpO2: 95% 93% 93% 96%  Weight:      Height:        Wt Readings from Last 3 Encounters:  08/22/18 111.6 kg  04/24/18 (!) 163.6 kg  03/17/18 (!) 155.6 kg     Intake/Output Summary (Last 24 hours) at 08/23/2018 1600 Last data filed at 08/23/2018 1344 Gross per 24 hour  Intake 480 ml  Output 1331 ml  Net -851 ml      Physical Exam Patient is examined daily including today on 08/23/18 , exams remain the same as of yesterday except that has changed   Gen:- Awake Alert, obese HEENT:- [place].AT, No sclera icterus Neck-Supple Neck,No JVD,.  Lungs-improved air movement , no wheezing or rales CV- S1, S2 normal, irregularly irregular  abd-  +ve B.Sounds, Abd Soft, No tenderness, increased truncal adiposity    Extremity/Skin:-  pedal pulses present  Psych-affect is appropriate, oriented x3 Neuro-generalized weakness but no new focal deficits, no tremors MSK-left hip wound appears to have healed nicely  Data Review:   Micro Results Recent Results (from the past 240 hour(s))  Blood culture (routine x 2)  Status: None   Collection Time: 08/17/18 11:06 AM  Result Value Ref Range Status   Specimen Description   Final    BLOOD RIGHT ARM Performed at [facility] [address] [place], [place] [address]    Special Requests   Final    BOTTLES DRAWN AEROBIC AND ANAEROBIC Blood Culture adequate volume Performed at [facility] [address], [place], [place] [address]    Culture   Final    NO GROWTH 5 DAYS Performed at [facility], [place] [address] [place], [place] [address]    Report Status 08/22/2018 FINAL  Final  Blood culture (routine x 2)     Status: None   Collection Time: 08/17/18 12:00 PM  Result Value Ref Range Status   Specimen Description   Final    BLOOD RIGHT ANTECUBITAL Performed at [facility] [address], [place], [place] [address]    Special Requests   Final    BOTTLES DRAWN AEROBIC ONLY Blood Culture results may not be optimal due to an inadequate volume of blood received in culture bottles Performed at [facility] [address], [place], [place] [address]    Culture   Final    NO GROWTH 5 DAYS Performed at [facility], [place] [address] [place], [place] [address]    Report  Status 08/22/2018 FINAL  Final  Body fluid culture     Status: None   Collection Time: 08/17/18  4:47 PM  Result Value Ref Range Status   Specimen Description   Final    PLEURAL RIGHT Performed at [facility] [address], [place], [place] [address]    Special Requests   Final    Normal Performed at [facility], [place] [address], [place], [place] [address]    Gram Stain   Final    ABUNDANT WBC PRESENT,BOTH PMN AND MONONUCLEAR NO ORGANISMS SEEN    Culture   Final    NO GROWTH 3 DAYS Performed at [facility], [place] [address], [place], [place] [address]    Report Status 08/21/2018 FINAL  Final  MRSA PCR Screening     Status: None   Collection Time: 08/17/18  8:11 PM  Result Value Ref Range Status   MRSA by PCR NEGATIVE NEGATIVE Final    Comment:        The GeneXpert MRSA Assay (FDA approved for NASAL specimens only), is one component of a comprehensive MRSA colonization surveillance program. It is not intended to diagnose MRSA infection nor to guide or monitor treatment for MRSA infections. Performed at [facility], [place] [address], [place], [place] [address]     Radiology Reports Dg Chest 1 View  Result Date: 08/17/2018 CLINICAL DATA:  Right-sided thoracentesis EXAM: CHEST  1 VIEW COMPARISON:  Portable chest x-ray of 08/17/2018 FINDINGS: A right thoracentesis has been performed. No pneumothorax is seen. Mild volume loss at the left lung base remains with small left effusion. Cardiomegaly is stable. Left PICC line tip is seen to the mid lower SVC. IMPRESSION: 1. No pneumothorax after right thoracentesis. 2. Little change in opacity at the left lung base consistent with atelectasis and left effusion. Electronically Signed   By: Ivar Drape M.D.   On: 08/17/2018 16:23   Ct Angio Chest Pe W And/or Wo Contrast  Result Date: 08/17/2018 CLINICAL DATA:  Low hemoglobin with pneumonia. Dyspnea and fever. EXAM: CT  ANGIOGRAPHY CHEST WITH CONTRAST TECHNIQUE: Multidetector CT imaging of the chest was performed using the standard protocol during bolus administration of intravenous contrast. Multiplanar CT image reconstructions and MIPs were obtained to evaluate the vascular anatomy. CONTRAST:  132mL ISOVUE-370 IOPAMIDOL (ISOVUE-370) INJECTION 76% COMPARISON:  08/17/2018 CXR, chest CT 06/17/2018 FINDINGS: Cardiovascular: Conventional branch pattern of the great vessels with minimal atherosclerosis at the origin of the left subclavian. No aortic aneurysm or dissection. Mild aortic atherosclerosis without aneurysm or dissection. Scattered coronary arteriosclerosis. Satisfactory opacification of the pulmonary arteries to the proximal segmental level without pulmonary embolus. Heart size is mildly enlarged without pericardial effusion or thickening. Mediastinum/Nodes: Patent trachea and mainstem bronchi. No thyroid mass. The CT appearance of the esophagus is unremarkable. No axillary, supraclavicular, mediastinal nor hilar adenopathy. Lungs/Pleura: New moderate to large right pleural effusion with trace to small left pleural effusion. Adjacent bibasilar and compressive atelectasis is noted. Atelectasis and/or scarring is also seen in the lingula. Respiratory motion artifact slightly limit assessment of the lung bases. No pneumothorax. No dominant mass. There appear to be scattered calcified pleural plaque along the periphery of the left hemithorax. Upper Abdomen: Cholecystectomy. 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 Increased fecal retention within the included colon. Musculoskeletal: Spondylosis of the dorsal spine. No acute nor aggressive osseous lesions. Review of the MIP images confirms the above findings. IMPRESSION: 1. No acute pulmonary embolus, aortic aneurysm or dissection. 2. New moderate to large right pleural effusion with trace to small left pleural effusion. There is adjacent compressive and  subsegmental atelectasis at each lung base and lingula. Aortic Atherosclerosis (ICD10-I70.0). Electronically Signed   By: Ashley Royalty M.D.   On: 08/17/2018 14:34   Dg Chest Portable 1 View  Result Date: 08/17/2018 CLINICAL DATA:  Right-sided chest pain EXAM: PORTABLE CHEST 1 VIEW COMPARISON:  CT chest 06/17/2018 FINDINGS: Small left pleural effusion. Bilateral mild interstitial thickening no focal consolidation or pneumothorax. Stable cardiomegaly. Left-sided PICC line with the tip projecting over the SVC. Moderate osteoarthritis of the right shoulder. IMPRESSION: 1. Cardiomegaly with mild pulmonary vascular congestion. Electronically Signed   By: Kathreen Devoid   On: 08/17/2018 11:44   US Thoracentesis Asp Pleural Space W/img Guide  Result Date: 08/17/2018 INDICATION: Patient with history of pneumonia, chronic left hip infection, bilateral pleural effusions right greater than left. Request made for diagnostic and therapeutic right thoracentesis. EXAM: ULTRASOUND GUIDED DIAGNOSTIC AND THERAPEUTIC RIGHT THORACENTESIS MEDICATIONS: None COMPLICATIONS: None immediate. PROCEDURE: An ultrasound guided thoracentesis was thoroughly discussed with the patient and questions answered. The benefits, risks, alternatives and complications were also discussed. The patient understands and wishes to proceed with the procedure. Written consent was obtained. Ultrasound was performed to localize and mark an adequate pocket of fluid in the right chest. The area was then prepped and draped in the normal sterile fashion. 1% Lidocaine was used for local anesthesia. Under ultrasound guidance a 6 Fr Safe-T-Centesis catheter was introduced. Thoracentesis was performed. The catheter was removed and a dressing applied. FINDINGS: A total of approximately 830 cc of hazy, amber fluid was removed. Samples were sent to the laboratory as requested by the clinical team. IMPRESSION: Successful ultrasound guided diagnostic and therapeutic right  thoracentesis yielding 830 cc of pleural fluid. Read by: Rowe Robert, PA-C Electronically Signed   By: Jacqulynn Cadet M.D.   On: 08/17/2018 16:18     CBC Recent Labs  Lab 08/17/18 1106 08/18/18 0402 08/19/18 0841 08/21/18 0433 08/22/18 0403  WBC 5.6 6.8 6.6 6.5 6.3  HGB  9.1* 8.1* 8.6* 8.2* 8.1*  HCT 30.6* 27.6* 28.9* 28.4* 28.5*  PLT 250 253 260 246 265  MCV 91.6 91.4 89.5 92.2 93.8  MCH 27.2 26.8 26.6 26.6 26.6  MCHC 29.7* 29.3* 29.8* 28.9* 28.4*  RDW 17.0* 17.0* 16.5* 16.1* 16.4*  LYMPHSABS 1.1  --   --   --   --   MONOABS 0.7  --   --   --   --   EOSABS 0.3  --   --   --   --   BASOSABS 0.0  --   --   --   --     Chemistries  Recent Labs  Lab 08/17/18 1058 08/17/18 1106 08/18/18 0402 08/19/18 0841 08/22/18 0403  NA  --  140 140 141 143  K  --  3.7 4.0 3.8 4.1  CL  --  106 104 106 108  CO2  --  26 28 27 28   GLUCOSE  --  111* 87 105* 102*  BUN  --  17 17 16 15   CREATININE  --  1.05 1.02 0.89 0.84  CALCIUM  --  9.0 8.4* 8.5* 8.6*  MG 2.0  --   --   --   --   AST  --  14*  --   --   --   ALT  --  8  --   --   --   ALKPHOS  --  64  --   --   --   BILITOT  --  0.3  --   --   --    Coagulation profile Recent Labs  Lab 08/17/18 1106  INR 1.28    Cardiac Enzymes Recent Labs  Lab 08/17/18 1106  TROPONINI <0.03   ------------------------------------------------------------------------------------------------------------------    Component Value Date/Time   BNP 373.9 (H) 08/17/2018 1106    Iretha Kirley M.D on 08/23/2018 at 4:00 PM  Pager---[phone] Go to [email or web address] - password [email or web address] for contact info  [facility] - Office  [phone]

## 2018-08-24 DIAGNOSIS — J90 Pleural effusion, not elsewhere classified: Secondary | ICD-10-CM

## 2018-08-24 DIAGNOSIS — R0902 Hypoxemia: Secondary | ICD-10-CM

## 2018-08-24 DIAGNOSIS — R0602 Shortness of breath: Secondary | ICD-10-CM

## 2018-08-24 DIAGNOSIS — J810 Acute pulmonary edema: Secondary | ICD-10-CM

## 2018-08-24 LAB — C-REACTIVE PROTEIN: CRP: 4.1 mg/dL — ABNORMAL HIGH (ref <1.0)

## 2018-08-24 LAB — SEDIMENTATION RATE: Sed Rate: 60 mm/hr — ABNORMAL HIGH (ref 0–16)

## 2018-08-24 MED ORDER — ALTEPLASE 2 MG IJ SOLR
2.0000 mg | Freq: Once | INTRAMUSCULAR | Status: AC
  Administered 2018-08-24: 2 mg
  Filled 2018-08-24: qty 2

## 2018-08-24 MED ORDER — ALTEPLASE 2 MG IJ SOLR: 2.0000 mg | Freq: Once | INTRAMUSCULAR | Status: DC

## 2018-08-24 NOTE — Consult Note (Signed)
NAME:  [name], MRN:  [id number], DOB:  September 05, 1945, LOS: 7 ADMISSION DATE:  08/17/2018, CONSULTATION DATE:  08/24/2018 REFERRING MD:  Hermelinda Dellen, CHIEF COMPLAINT:  Pleural effusion   Brief History   72 year old male with giant cell arteritis now off prednisone who just had a very complicated course in [facility] to include hip fracture after a fall, intubation and delirium, please see H&P for extensive details.  Patient was brought to [facility] for CP and SOB and was noted to have R>L pleural effusion and the right was tapped.  Pleural fluid analysis was inconsistent and PCCM was called on consultation.  Patient denies any previous history of pleural effusion or thoracentesis in the past but did have new onset rapid a-fib during this admission.    History of present illness   72 year old male with giant cell arteritis now off prednisone who just had a very complicated course in [place] to include hip fracture after a fall, intubation and delirium, please see H&P for extensive details.  Patient was brought to [facility] for CP and SOB and was noted to have R>L pleural effusion and the right was tapped.  Pleural fluid analysis was inconsistent and PCCM was called on consultation.  Patient denies any previous history of pleural effusion or thoracentesis in the past but did have new onset rapid a-fib during this admission.    Past Medical History  See below  Significant Hospital Events   [place]  Consults:  PCCM  Procedures:  [place] 12/17  Significant Diagnostic Tests:  Pleural effusion analysis with protein <3, LDH 473, WBC of 2815 with 40% PMNs and 26% mono, cytology is negative for malignancy  Micro Data:  Pleural 12/17 negative Blood 12/17 negative  Antimicrobials:  Flagyl chronically form [facility]   Interim history/subjective:  No events overnight, no new complaints  Objective   Blood pressure 132/71, pulse 85, temperature 97.9 F (36.6 C), temperature source Oral, resp. rate 20, height  5\' 8"  (1.727 m), weight 111.6 kg, SpO2 91 %.        Intake/Output Summary (Last 24 hours) at 08/24/2018 0853 Last data filed at 08/24/2018 0720 Gross per 24 hour  Intake 720 ml  Output 1411 ml  Net -691 ml   Filed Weights   08/18/18 0500 08/19/18 0520 08/22/18 0516  Weight: (!) 139.7 kg (!) 139.5 kg 111.6 kg    Examination: General: Chronically ill appearing morbidly obese male, NAD HENT: [place]/AT, PERRL, EOM-I and MMM Lungs: Decreased BS diffusely Cardiovascular: RRR, Nl S1/S2 and -M/R/G Abdomen: Soft, NT, ND and +BS Extremities: Fracture repair, -edema and -tenderness Neuro: Alert and interactive Skin: intact  Resolved Hospital Problem list     Assessment & Plan:  72 year old male with exudative pleural effusion by LDH criteria only with cell analysis that is inconsistent with a bacterial infection and negative cytology.  Discussed with PCCM-NP.  Pleural effusion:  - CXR today  - If CXR shows return of effusion then will rethora and sent cytology again  - Will send an auto-immune panel  - If above is positive may need to consider steroids  - If rethora then will need to check pH and glucose as well  Hypoxemia:  - Titrate O2 for sat of 88-92%  Pulmonary edema:  - Diureses  - May need to consider repeat echo  PCCM will see again on Thursday  Discussed with PCCM-NP.  Labs   CBC: Recent Labs  Lab 08/17/18 1106 08/18/18 0402 08/19/18 0841 08/21/18  1324 08/22/18 0403  WBC 5.6 6.8 6.6 6.5 6.3  NEUTROABS 3.4  --   --   --   --   HGB 9.1* 8.1* 8.6* 8.2* 8.1*  HCT 30.6* 27.6* 28.9* 28.4* 28.5*  MCV 91.6 91.4 89.5 92.2 93.8  PLT 250 253 260 246 401    Basic Metabolic Panel: Recent Labs  Lab 08/17/18 1058 08/17/18 1106 08/18/18 0402 08/19/18 0841 08/22/18 0403  NA  --  140 140 141 143  K  --  3.7 4.0 3.8 4.1  CL  --  106 104 106 108  CO2  --  26 28 27 28   GLUCOSE  --  111* 87 105* 102*  BUN  --  17 17 16 15   CREATININE  --  1.05 1.02 0.89 0.84    CALCIUM  --  9.0 8.4* 8.5* 8.6*  MG 2.0  --   --   --   --    GFR: Estimated Creatinine Clearance: 96.4 mL/min (by C-G formula based on SCr of 0.84 mg/dL). Recent Labs  Lab 08/17/18 1202 08/18/18 0402 08/19/18 0841 08/21/18 0433 08/22/18 0403  WBC  --  6.8 6.6 6.5 6.3  LATICACIDVEN 0.83  --   --   --   --     Liver Function Tests: Recent Labs  Lab 08/17/18 1106  AST 14*  ALT 8  ALKPHOS 64  BILITOT 0.3  PROT 6.1*  ALBUMIN 2.8*   No results for input(s): LIPASE, AMYLASE in the last 168 hours. No results for input(s): AMMONIA in the last 168 hours.  ABG No results found for: PHART, PCO2ART, PO2ART, HCO3, TCO2, ACIDBASEDEF, O2SAT   Coagulation Profile: Recent Labs  Lab 08/17/18 1106  INR 1.28    Cardiac Enzymes: Recent Labs  Lab 08/17/18 1106  TROPONINI <0.03    HbA1C: No results found for: HGBA1C  CBG: No results for input(s): GLUCAP in the last 168 hours.  Review of Systems:   12 point ROS is negative other than above plus hip pain.  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   Surgical History    Past Surgical History:  Procedure Laterality Date  . APPENDECTOMY    . ARTERY BIOPSY Left 12/30/2017   Procedure: BIOPSY TEMPORAL ARTERY;  Surgeon: Fanny Skates, MD;  Location: [facility];  Service: General;  Laterality: Left;  . CHOLECYSTECTOMY    . COLONOSCOPY W/ POLYPECTOMY    . ESOPHAGOGASTRODUODENOSCOPY (EGD) WITH PROPOFOL  07/06/2012   Procedure: ESOPHAGOGASTRODUODENOSCOPY (EGD) WITH PROPOFOL;  Surgeon: Jeryl Columbia, MD;  Location: [facility];  Service: Endoscopy;  Laterality: N/A;  . ESOPHAGOSCOPY    . EYE SURGERY     left eye-  muscle repair  . HERNIA REPAIR     ventral hernia  . INCISION AND DRAINAGE HIP Left 03/17/2018   Procedure: IRRIGATION AND DEBRIDEMENT LEFT THIGH WOUND;  Surgeon: Gaynelle Arabian, MD;  Location: [facility];  Service: Orthopedics;  Laterality: Left;  . JOINT REPLACEMENT     bilateral hips.  Right broke and had to be re placed again  . MASS EXCISION N/A 03/02/2015   Procedure: EXCISION ABDOMINAL WALL MASS;  Surgeon: Alphonsa Overall, MD;  Location: [place];  Service: General;  Laterality: N/A;  . TOTAL HIP REVISION Left  02/17/2018   Procedure: Left total hip arthroplasty revision;  Surgeon: Gaynelle Arabian, MD;  Location: [facility];  Service: Orthopedics;  Laterality: Left;  Marland Kitchen VERTICAL BANDED GASTROPLASTY       Social History   reports that he quit smoking about 34 years ago. His smoking use included cigarettes. He started smoking about 52 years ago. He has a 20.00 pack-year smoking history. He has never used smokeless tobacco. He reports current alcohol use. He reports that he does not use drugs.   Family History   His family history includes Alcohol abuse in his father; Cancer in his mother.   Allergies Allergies  Allergen Reactions  . Bee Venom Swelling  . Nickel Rash     Home Medications  Prior to Admission medications   Medication Sig Start Date End Date Taking? Authorizing Provider  acetaminophen (TYLENOL) 325 MG tablet Take 2 tablets (650 mg total) by mouth every 6 (six) hours as needed for mild pain, fever or headache. 04/27/18  Yes Barton Dubois, MD  apixaban (ELIQUIS) 5 MG TABS tablet Take 5 mg by mouth 2 (two) times daily.   Yes [provider]  calcium carbonate (TUMS - DOSED IN MG ELEMENTAL CALCIUM) 500 MG chewable tablet Chew 1,000 mg by mouth 3 (three) times daily as needed for indigestion.   Yes [provider]  carboxymethylcellulose (REFRESH TEARS) 0.5 % SOLN Place 1 drop into both eyes daily as needed (dry eye/irritation).  07/14/18  Yes [provider]   Cholecalciferol (VITAMIN D3) 2000 units TABS Take 2,000 Units by mouth every evening.    Yes [provider]  cycloSPORINE (RESTASIS) 0.05 % ophthalmic emulsion Place 1 drop into both eyes 2 (two) times daily.    Yes [provider]  diltiazem (DILACOR XR) 120 MG 24 hr capsule Take 240 mg by mouth daily.   Yes [provider]  DM-Benzocaine-Menthol (CHLORASEPTIC TOTAL) 01-04-09 MG LOZG Use as directed 1 lozenge in the mouth or throat every 2 (two) hours as needed (sore throat).   Yes [provider]  escitalopram (LEXAPRO) 10 MG tablet Take 10 mg by mouth daily.   Yes [provider]  ferrous sulfate 325 (65 FE) MG tablet Take 325 mg by mouth 2 (two) times daily with a meal.   Yes [provider]  furosemide (LASIX) 20 MG tablet Take 20 mg by mouth daily.   Yes [provider]  gabapentin (NEURONTIN) 100 MG capsule Take 100 mg by mouth 3 (three) times daily.   Yes [provider]  hydrOXYzine (ATARAX/VISTARIL) 25 MG tablet Take 25 mg by mouth 3 (three) times daily as needed for itching or anxiety. 07/14/18  Yes [provider]  ipratropium-albuterol (DUONEB) 0.5-2.5 (3) MG/3ML SOLN Inhale 3 mLs into the lungs every 4 (four) hours as needed for wheezing or shortness of breath. 07/14/18  Yes [provider]  Lidocaine 4 % PTCH Place 3 patches onto the skin See admin instructions. Place 3 patches to left hip & thigh region every morning & remove after 3 hours. 07/15/18  Yes [provider]  Melatonin 3 MG TABS Take 6 mg by mouth at bedtime.   Yes [provider]  metoprolol tartrate (LOPRESSOR) 25 MG tablet Take 25 mg by mouth 2 (two) times daily. 07/14/18  Yes [provider]  mirtazapine (REMERON) 7.5 MG tablet Take 7.5 mg by mouth at bedtime. 07/14/18  Yes [provider]  Multiple Vitamins-Minerals (CENTRUM ADULTS PO) Take 1 tablet by mouth  every evening.    Yes [provider]  nystatin (MYCOSTATIN/NYSTOP) powder Apply 1 g topically daily as needed (yeast). Apply to bilateral groin as needed daily for yeast   Yes [provider]  oxyCODONE (OXY IR/ROXICODONE) 5 MG immediate release tablet Take 1 tablet (5 mg total) by mouth every 6 (six) hours as needed for moderate pain, severe pain or breakthrough pain. 04/27/18  Yes Barton Dubois, MD  pantoprazole (PROTONIX) 40 MG tablet Take 1 tablet (40 mg total) by mouth daily. 04/28/18  Yes Barton Dubois, MD  polyethylene glycol [facility] / Floria Raveling) packet Take 17 g by mouth daily as needed for mild constipation. Patient taking differently: Take 17 g by mouth daily.  02/23/18  Yes Arrien, Jimmy Picket, MD  QUEtiapine (SEROQUEL) 25 MG tablet Take 75 mg by mouth at bedtime.   Yes [provider]  rOPINIRole (REQUIP) 0.25 MG tablet Take 0.25 mg by mouth at bedtime.   Yes [provider]  senna-docusate (SENOKOT-S) 8.6-50 MG tablet Take 2 tablets by mouth 2 (two) times daily. 07/14/18  Yes [provider]  thiamine 100 MG tablet Take 100 mg by mouth daily. 07/15/18  Yes [provider]   Rush Farmer, M.D. [facility] Pulmonary/Critical Care Medicine. Pager: [phone]. After hours pager: [phone].

## 2018-08-24 NOTE — Progress Notes (Signed)
Patient Demographics:    [name], is a 72 y.o. male, DOB - 1946-05-20, BEM:[id number]  Admit date - 08/17/2018   Admitting Physician No admitting provider for patient encounter.  Outpatient Primary MD for the patient is [name], MD  LOS - 7   Chief Complaint  Patient presents with  . Abnormal Lab        Subjective:    [name] today has no fevers, no emesis,  No chest pain, oral intake is fair, no new concerns, Shob improved  Assessment  & Plan :    Principal Problem:   Atrial fibrillation with RVR (HCC) Active Problems:   Left hip postoperative wound infection   SIRS (systemic inflammatory response syndrome) (HCC)   Anemia of chronic disease   Depression   OSA (obstructive sleep apnea)   Obesity, Class III, BMI 40-49.9 (morbid obesity) (HCC)   Hypertensive urgency   Pleural effusion on left   Acute on chronic diastolic (congestive) heart failure (HCC)   OSA on CPAP  Brief Summary 72 y.o. male, with history of hypertension, A. 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 fib with RVR and also found to have right-sided pleural effusion, IR removed 830 mL of clear fluid, awaiting insurance approval for transfer back to SNF rehab   Plan:- 1)Chronic Atrial Fibrillation with RVR--- improved rate control, continue Cardizem CD 180 mg daily, and metoprolol  25 mg bid,  last known EF was 50 to 55% , continue Eliquis for anticoagulation  2)HFpEF with right-sided pleural effusion--- patient with history of chronic diastolic dysfunction CHF suspect exacerbation secondary to A. fib with RVR, improved post thoracentesis on 08/17/2018 with removal of 830 mL of clear fluid (fluid Gram stain and culture negative  so far), fluid LDH 470 serum LDH 146, exudative effusion by Light's criteria, d/w PCCM attending Dr. 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----patient usually follows with infectious disease at [facility], continue IV ertapenem through 08/21/2018, ID physician at [facility] recommends Flagyl 500 twice daily for Bacteroides fragilis keep infection starting 08/22/2018 for 90 days, ESR is down to 60 from 107, CRP is down to 4.1 from 23.6  4)Morbid Obesity with OSA-- c/n CPAP nightly  5)DEpression----stable,  Seroquel and Remeron were held due to concerns about QT prolongation,  continue Lexapro   6)Chronic Anemia----etiology unclear, stool is Hemoccult positive at this time  , patient on Eliquis due to history of A. Fib , hemoglobin is currently 8.2, monitor H&H closely, continue Protonix  7)Generalized Weakness/Debility--- PT eval appreciated, recommends SNF rehab awaiting insurance approval for SNF Rehab discharge ,  fatigue persist, difficulties with mobility related activities of daily living persist,, dyspnea on exertion with minimal activity persist   Disposition/Need for in-Hospital Stay- patient unable to be discharged at this  time due to awaiting insurance approval for SNF rehab transfer   Code Status : Full  Disposition Plan  : SNF Rehab  DVT Prophylaxis  : Eliquis  Lab Results  Component Value Date   PLT 265 08/22/2018    Inpatient Medications  Scheduled Meds: . apixaban  5 mg Oral BID  . cholecalciferol  2,000 Units Oral QPM  . cycloSPORINE  1 drop Both Eyes BID  . diltiazem  180 mg Oral Daily  .  escitalopram  10 mg Oral Daily  . ferrous sulfate  325 mg Oral BID WC  . gabapentin  100 mg Oral TID  . Gerhardt's butt cream   Topical BID  . Melatonin  6 mg Oral QHS  . metoprolol tartrate  25 mg Oral BID  . metroNIDAZOLE  500 mg Oral BID  . multivitamin with minerals  1 tablet Oral QPM  . pantoprazole  40 mg Oral Daily  . rOPINIRole  0.25 mg Oral QHS  . senna-docusate  2 tablet Oral BID  . sodium chloride flush  10-40 mL Intracatheter Q12H  . thiamine  100 mg Oral Daily   Continuous Infusions:  PRN Meds:.acetaminophen **OR** acetaminophen, calcium carbonate, hydrOXYzine, ipratropium-albuterol, menthol-cetylpyridinium, ondansetron **OR** ondansetron (ZOFRAN) IV, oxyCODONE, phenol, polyethylene glycol, polyvinyl alcohol, sodium chloride flush    Anti-infectives (From admission, onward)   Start     Dose/Rate Route Frequency Ordered Stop   08/22/18 1000  metroNIDAZOLE (FLAGYL) tablet 500 mg     500 mg Oral 2 times daily 08/21/18 1511 11/20/18 0959   08/21/18 1515  metroNIDAZOLE (FLAGYL) tablet 500 mg  Status:  Discontinued     500 mg Oral 2 times daily 08/21/18 1510 08/21/18 1511   08/17/18 1730  ertapenem (INVANZ) 1,000 mg in sodium chloride 0.9 % 100 mL IVPB  Status:  Discontinued     1 g 200 mL/hr over 30 Minutes Intravenous Every 24 hours 08/17/18 1720 08/17/18 1728   08/17/18 1200  ertapenem (INVANZ) 1,000 mg in sodium chloride 0.9 % 100 mL IVPB     1 g 200 mL/hr over 30 Minutes Intravenous Every 24 hours 08/17/18 1036 08/21/18 1308        Objective:   Vitals:   08/23/18 2052 08/23/18 2210 08/24/18 0451 08/24/18 1411  BP: 121/75  132/71 100/76  Pulse: 97 99 85 100  Resp: _0 Temp: 99.1 F (37.3 C)  97.9 F (36.6 C) 98 F (36.7 C)  TempSrc: Oral  Oral Oral  SpO2: 91% 93% 91% 94%  Weight:      Height:        Wt Readings from Last 3 Encounters:  08/22/18 111.6 kg  04/24/18 (!) 163.6 kg  03/17/18 (!) 155.6 kg     Intake/Output Summary (Last 24  hours) at 08/24/2018 1655 Last data filed at 08/24/2018 1317 Gross per 24 hour  Intake 240 ml  Output 1250 ml  Net -1010 ml     Physical Exam Patient is examined daily including today on 08/24/18 , exams remain the same as of yesterday except that has changed   Gen:- Awake Alert, obese, in no acute distress HEENT:- [place].AT, No sclera icterus Neck-Supple Neck,No JVD,.  Lungs-improved air movement , no wheezing or rales CV- S1, S2 normal, irregularly irregular  abd-  +ve B.Sounds, Abd Soft, No tenderness, increased truncal adiposity    Extremity/Skin:-  pedal pulses present  Psych-affect is appropriate, oriented x3 Neuro-generalized weakness but no new focal deficits, no tremors MSK-left  hip wound appears to have healed nicely  Data Review:   Micro Results Recent Results (from the past 240 hour(s))  Blood culture (routine x 2)     Status: None   Collection Time: 08/17/18 11:06 AM  Result Value Ref Range Status   Specimen Description   Final    BLOOD RIGHT ARM Performed at [facility] [address], [place], [place] [address]    Special Requests   Final    BOTTLES DRAWN AEROBIC AND ANAEROBIC Blood Culture adequate volume Performed at [facility] [address] [place], [place] [address]    Culture   Final    NO GROWTH 5 DAYS Performed at [facility], [place] [address] [place], [place] [address]    Report Status 08/22/2018 FINAL  Final  Blood culture (routine x 2)     Status: None   Collection Time: 08/17/18 12:00 PM  Result Value Ref Range Status   Specimen Description   Final    BLOOD RIGHT ANTECUBITAL Performed at [facility] [address], [place], [place] [address]    Special Requests   Final    BOTTLES DRAWN AEROBIC ONLY Blood Culture results may not be optimal due to an inadequate volume of blood received in culture bottles Performed at [facility] [address]  [place], [place] [address]    Culture   Final    NO GROWTH 5 DAYS Performed at [facility], [place] [address] [place], [place] [address]    Report Status 08/22/2018 FINAL  Final  Body fluid culture     Status: None   Collection Time: 08/17/18  4:47 PM  Result Value Ref Range Status   Specimen Description   Final    PLEURAL RIGHT Performed at [facility] [address] [place], [place] [address]    Special Requests   Final    Normal Performed at [facility], [place] [address] [place], [place] [address]    Gram Stain   Final    ABUNDANT WBC PRESENT,BOTH PMN AND MONONUCLEAR NO ORGANISMS SEEN    Culture   Final    NO GROWTH 3 DAYS Performed at [facility], [place] [address] [place], [place] [address]    Report Status 08/21/2018 FINAL  Final  MRSA PCR Screening     Status: None   Collection Time: 08/17/18  8:11 PM  Result Value Ref Range Status   MRSA by PCR NEGATIVE NEGATIVE Final    Comment:        The GeneXpert MRSA Assay (FDA approved for NASAL specimens only), is one component of a comprehensive MRSA colonization surveillance program. It is not intended to diagnose MRSA infection nor to guide or monitor treatment for MRSA infections. Performed at [facility], [place] [address], [place], [place] [address]     Radiology Reports Dg Chest 1 View  Result Date: 08/17/2018 CLINICAL DATA:  Right-sided thoracentesis EXAM: CHEST  1 VIEW COMPARISON:  Portable chest x-ray of 08/17/2018 FINDINGS: A right thoracentesis has been performed. No pneumothorax is seen. Mild volume loss at the left lung base remains with small left effusion. Cardiomegaly is stable. Left PICC line tip is seen to the mid lower SVC. IMPRESSION: 1. No pneumothorax after right thoracentesis. 2. Little change in opacity at the left lung base consistent with atelectasis and left effusion. Electronically Signed   By: Windy Canny.D.  On: 08/17/2018  16:23   Ct Angio Chest Pe W And/or Wo Contrast  Result Date: 08/17/2018 CLINICAL DATA:  Low hemoglobin with pneumonia. Dyspnea and fever. EXAM: CT ANGIOGRAPHY CHEST WITH CONTRAST TECHNIQUE: Multidetector CT imaging of the chest was performed using the standard protocol during bolus administration of intravenous contrast. Multiplanar CT image reconstructions and MIPs were obtained to evaluate the vascular anatomy. CONTRAST:  13m ISOVUE-370 IOPAMIDOL (ISOVUE-370) INJECTION 76% COMPARISON:  08/17/2018 CXR, chest CT 06/17/2018 FINDINGS: Cardiovascular: Conventional branch pattern of the great vessels with minimal atherosclerosis at the origin of the left subclavian. No aortic aneurysm or dissection. Mild aortic atherosclerosis without aneurysm or dissection. Scattered coronary arteriosclerosis. Satisfactory opacification of the pulmonary arteries to the proximal segmental level without pulmonary embolus. Heart size is mildly enlarged without pericardial effusion or thickening. Mediastinum/Nodes: Patent trachea and mainstem bronchi. No thyroid mass. The CT appearance of the esophagus is unremarkable. No axillary, supraclavicular, mediastinal nor hilar adenopathy. Lungs/Pleura: New moderate to large right pleural effusion with trace to small left pleural effusion. Adjacent bibasilar and compressive atelectasis is noted. Atelectasis and/or scarring is also seen in the lingula. Respiratory motion artifact slightly limit assessment of the lung bases. No pneumothorax. No dominant mass. There appear to be scattered calcified pleural plaque along the periphery of the left hemithorax. Upper Abdomen: Cholecystectomy. 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 Increased fecal retention within the included colon. Musculoskeletal: Spondylosis of the dorsal spine. No acute nor aggressive osseous lesions. Review of the MIP images confirms the above findings. IMPRESSION: 1. No acute pulmonary embolus, aortic  aneurysm or dissection. 2. New moderate to large right pleural effusion with trace to small left pleural effusion. There is adjacent compressive and subsegmental atelectasis at each lung base and lingula. Aortic Atherosclerosis (ICD10-I70.0). Electronically Signed   By: DAshley RoyaltyM.D.   On: 08/17/2018 14:34   Dg Chest Portable 1 View  Result Date: 08/17/2018 CLINICAL DATA:  Right-sided chest pain EXAM: PORTABLE CHEST 1 VIEW COMPARISON:  CT chest 06/17/2018 FINDINGS: Small left pleural effusion. Bilateral mild interstitial thickening no focal consolidation or pneumothorax. Stable cardiomegaly. Left-sided PICC line with the tip projecting over the SVC. Moderate osteoarthritis of the right shoulder. IMPRESSION: 1. Cardiomegaly with mild pulmonary vascular congestion. Electronically Signed   By: HKathreen Devoid  On: 08/17/2018 11:44   UKoreaThoracentesis Asp Pleural Space W/img Guide  Result Date: 08/17/2018 INDICATION: Patient with history of pneumonia, chronic left hip infection, bilateral pleural effusions right greater than left. Request made for diagnostic and therapeutic right thoracentesis. EXAM: ULTRASOUND GUIDED DIAGNOSTIC AND THERAPEUTIC RIGHT THORACENTESIS MEDICATIONS: None COMPLICATIONS: None immediate. PROCEDURE: An ultrasound guided thoracentesis was thoroughly discussed with the patient and questions answered. The benefits, risks, alternatives and complications were also discussed. The patient understands and wishes to proceed with the procedure. Written consent was obtained. Ultrasound was performed to localize and mark an adequate pocket of fluid in the right chest. The area was then prepped and draped in the normal sterile fashion. 1% Lidocaine was used for local anesthesia. Under ultrasound guidance a 6 Fr Safe-T-Centesis catheter was introduced. Thoracentesis was performed. The catheter was removed and a dressing applied. FINDINGS: A total of approximately 830 cc of hazy, amber fluid was  removed. Samples were sent to the laboratory as requested by the clinical team. IMPRESSION: Successful ultrasound guided diagnostic and therapeutic right thoracentesis yielding 830 cc of pleural fluid. Read by: KRowe Robert PA-C Electronically Signed   By: HJacqulynn CadetM.D.   On:  08/17/2018 16:18     CBC Recent Labs  Lab 08/18/18 0402 08/19/18 0841 08/21/18 0433 08/22/18 0403  WBC 6.8 6.6 6.5 6.3  HGB 8.1* 8.6* 8.2* 8.1*  HCT 27.6* 28.9* 28.4* 28.5*  PLT 253 260 246 265  MCV 91.4 89.5 92.2 93.8  MCH 26.8 26.6 26.6 26.6  MCHC 29.3* 29.8* 28.9* 28.4*  RDW 17.0* 16.5* 16.1* 16.4*    Chemistries  Recent Labs  Lab 08/18/18 0402 08/19/18 0841 08/22/18 0403  NA 140 141 143  K 4.0 3.8 4.1  CL 104 106 108  CO2 _0 GLUCOSE 87 105* 102*  BUN _1 CREATININE 1.02 0.89 0.84  CALCIUM 8.4* 8.5* 8.6*   ------------------------------------------------------------------------------------------------------------------    Component Value Date/Time   BNP 373.9 (H) 08/17/2018 1106    Glessie Eustice M.D on 08/24/2018 at 4:55 PM  Pager---[phone] Go to [email or web address] - password [email or web address] for contact info  [facility] - Office  [phone]

## 2018-08-24 NOTE — Plan of Care (Signed)
Patient up to chair for approximately 1 hour during shift, max 2 assist to get in and out of bed.  Patient continues to verbalize being depressed during the holiday season since he is estranged from his children.  Medicated for chronic pain in back and shoulder x 2 with improvement.

## 2018-08-25 ENCOUNTER — Inpatient Hospital Stay (HOSPITAL_COMMUNITY): Payer: Medicare Other

## 2018-08-25 LAB — CBC
HCT: 28.3 % — ABNORMAL LOW (ref 39.0–52.0)
Hemoglobin: 8.1 g/dL — ABNORMAL LOW (ref 13.0–17.0)
MCH: 26.7 pg (ref 26.0–34.0)
MCHC: 28.6 g/dL — ABNORMAL LOW (ref 30.0–36.0)
MCV: 93.4 fL (ref 80.0–100.0)
Platelets: 279 10*3/uL (ref 150–400)
RBC: 3.03 MIL/uL — ABNORMAL LOW (ref 4.22–5.81)
RDW: 16.3 % — ABNORMAL HIGH (ref 11.5–15.5)
WBC: 6.7 10*3/uL (ref 4.0–10.5)
nRBC: 0 % (ref 0.0–0.2)

## 2018-08-25 LAB — BASIC METABOLIC PANEL
Anion gap: 8 (ref 5–15)
BUN: 18 mg/dL (ref 8–23)
CO2: 27 mmol/L (ref 22–32)
Calcium: 8.6 mg/dL — ABNORMAL LOW (ref 8.9–10.3)
Chloride: 108 mmol/L (ref 98–111)
Creatinine, Ser: 0.89 mg/dL (ref 0.61–1.24)
GFR calc Af Amer: >60 mL/min (ref >60)
GFR calc non Af Amer: >60 mL/min (ref >60)
Glucose, Bld: 100 mg/dL — ABNORMAL HIGH (ref 70–99)
Potassium: 4.3 mmol/L (ref 3.5–5.1)
Sodium: 143 mmol/L (ref 135–145)

## 2018-08-25 NOTE — Progress Notes (Signed)
Patient Demographics:    [name], is a 72 y.o. male, DOB - 02/09/1946, RDE:[id number]  Admit date - 08/17/2018   Admitting Physician No admitting provider for patient encounter.  Outpatient Primary MD for the patient is [name], MD  LOS - 8   Chief Complaint  Patient presents with  . Abnormal Lab        Subjective:   Still has shortnes of breath and no chest pain, some cough, no diarrhea.    Assessment  & Plan :    Principal Problem:   Atrial fibrillation with RVR (HCC) Active Problems:   Left hip postoperative wound infection   SIRS (systemic inflammatory response syndrome) (HCC)   Anemia of chronic disease   Depression   OSA (obstructive sleep apnea)   Obesity, Class III, BMI 40-49.9 (morbid obesity) ([place])   Hypertensive urgency   Pleural effusion on left   Acute on chronic diastolic (congestive) heart failure (HCC)   OSA on CPAP  Brief Summary 72 y.o. male, with history of hypertension, A. 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 fib with RVR and also found to have right-sided pleural effusion, IR removed 830 mL of clear fluid, awaiting insurance approval for transfer back to SNF rehab   Plan:- 1) Chronic Atrial Fibrillation with RVR improved rate control, continue Cardizem CD 180 mg daily, and metoprolol  25 mg bid,  last known EF was 50 to 55% , continue Eliquis for anticoagulation  2)HFpEF with right-sided pleural effusion patient with history of chronic diastolic dysfunction CHF suspect exacerbation secondary to A. fib with RVR, improved post thoracentesis on 08/17/2018 with removal of 830 mL of clear fluid (fluid Gram stain and culture negative so far), fluid LDH 470 serum LDH 146,  exudative effusion by Light's criteria,  PCCM appreciated, continue Lasix 40 mg twice daily, daily weights and strict I's and O's, supplemental oxygen as needed  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----patient usually follows with infectious disease at [facility],  Completed IV ertapenem through 08/21/2018, ID physician at [facility] recommends Flagyl 500 twice daily for Bacteroides fragilis keep infection starting 08/22/2018 for 90 days, ESR is down to 60 from 107, CRP is down to 4.1 from 23.6  4)Morbid Obesity with OSA-- c/n CPAP nightly  5)DEpression----stable,  Seroquel and Remeron were held due to concerns about QT prolongation,  continue Lexapro   6)Chronic Anemia----etiology unclear, stool is Hemoccult positive at this time  , patient on Eliquis due to history of A. Fib , hemoglobin is currently 8.2, monitor H&H closely, continue Protonix  7)Generalized Weakness/Debility--- PT eval appreciated, recommends SNF rehab awaiting insurance approval for SNF Rehab discharge ,  fatigue persist, difficulties with mobility related activities of daily living persist,, dyspnea on exertion with minimal activity persist   Disposition/Need for in-Hospital Stay- patient unable to be discharged at this time due to awaiting insurance approval for SNF rehab transfer   Code Status : Full  Disposition Plan  : SNF Rehab  DVT Prophylaxis  : Eliquis  Lab Results  Component Value Date   PLT  279 08/25/2018    Inpatient Medications  Scheduled Meds: . apixaban  5 mg Oral BID  . cholecalciferol  2,000 Units Oral QPM  . cycloSPORINE  1 drop Both Eyes BID  . diltiazem  180 mg Oral Daily  . escitalopram  10 mg Oral Daily  . ferrous sulfate  325 mg Oral BID WC  . gabapentin  100 mg Oral TID  . Gerhardt's butt cream   Topical BID  . Melatonin  6 mg Oral QHS  . metoprolol tartrate  25 mg Oral BID  . metroNIDAZOLE  500 mg Oral BID  . multivitamin with minerals  1  tablet Oral QPM  . pantoprazole  40 mg Oral Daily  . rOPINIRole  0.25 mg Oral QHS  . senna-docusate  2 tablet Oral BID  . sodium chloride flush  10-40 mL Intracatheter Q12H  . thiamine  100 mg Oral Daily   Continuous Infusions:  PRN Meds:.acetaminophen **OR** acetaminophen, calcium carbonate, hydrOXYzine, ipratropium-albuterol, menthol-cetylpyridinium, ondansetron **OR** ondansetron (ZOFRAN) IV, oxyCODONE, phenol, polyethylene glycol, polyvinyl alcohol, sodium chloride flush    Anti-infectives (From admission, onward)   Start     Dose/Rate Route Frequency Ordered Stop   08/22/18 1000  metroNIDAZOLE (FLAGYL) tablet 500 mg     500 mg Oral 2 times daily 08/21/18 1511 11/20/18 0959   08/21/18 1515  metroNIDAZOLE (FLAGYL) tablet 500 mg  Status:  Discontinued     500 mg Oral 2 times daily 08/21/18 1510 08/21/18 1511   08/17/18 1730  ertapenem (INVANZ) 1,000 mg in sodium chloride 0.9 % 100 mL IVPB  Status:  Discontinued     1 g 200 mL/hr over 30 Minutes Intravenous Every 24 hours 08/17/18 1720 08/17/18 1728   08/17/18 1200  ertapenem (INVANZ) 1,000 mg in sodium chloride 0.9 % 100 mL IVPB     1 g 200 mL/hr over 30 Minutes Intravenous Every 24 hours 08/17/18 1036 08/21/18 1308        Objective:   Vitals:   08/24/18 1411 08/24/18 2317 08/25/18 0514 08/25/18 1350  BP: 100/76 105/72 (!) 133/92 129/89  Pulse: 100 84 95 89  Resp: 15 20 16 16   Temp: 98 F (36.7 C) 98.2 F (36.8 C) 97.9 F (36.6 C) 98 F (36.7 C)  TempSrc: Oral Oral Oral Oral  SpO2: 94% 92% 92% 93%  Weight:      Height:        Wt Readings from Last 3 Encounters:  08/22/18 111.6 kg  04/24/18 (!) 163.6 kg  03/17/18 (!) 155.6 kg     Intake/Output Summary (Last 24 hours) at 08/25/2018 1524 Last data filed at 08/25/2018 1400 Gross per 24 hour  Intake 460 ml  Output 1050 ml  Net -590 ml     Physical Exam Patient is examined daily including today on 08/24/18 , exams remain the same as of yesterday except that  has changed   Gen:- Awake Alert, obese, in no acute distress HEENT:- [place].AT, No sclera icterus Neck-Supple Neck,No JVD,.  Lungs-improved air movement , no wheezing or rales CV- S1, S2 normal, irregularly irregular  abd-  +ve B.Sounds, Abd Soft, No tenderness, increased truncal adiposity    Extremity/Skin:-  pedal pulses present  Psych-affect is appropriate, oriented x3 Neuro-generalized weakness but no new focal deficits, no tremors MSK-left hip wound appears to have healed nicely  Data Review:   Micro Results Recent Results (from the past 240 hour(s))  Blood culture (routine x 2)     Status: None  Collection Time: 08/17/18 11:06 AM  Result Value Ref Range Status   Specimen Description   Final    BLOOD RIGHT ARM Performed at [facility] [address], [place], [place] [address]    Special Requests   Final    BOTTLES DRAWN AEROBIC AND ANAEROBIC Blood Culture adequate volume Performed at [facility] [address], [place], [place] [address]    Culture   Final    NO GROWTH 5 DAYS Performed at [facility], [place] [address] [place], [place] [address]    Report Status 08/22/2018 FINAL  Final  Blood culture (routine x 2)     Status: None   Collection Time: 08/17/18 12:00 PM  Result Value Ref Range Status   Specimen Description   Final    BLOOD RIGHT ANTECUBITAL Performed at [facility] [address], [place], [place] [address]    Special Requests   Final    BOTTLES DRAWN AEROBIC ONLY Blood Culture results may not be optimal due to an inadequate volume of blood received in culture bottles Performed at [facility] [address], [place], [place] [address]    Culture   Final    NO GROWTH 5 DAYS Performed at [facility], [place] [address], [place], [place] [address]    Report Status 08/22/2018 FINAL  Final  Body fluid culture     Status: None   Collection Time: 08/17/18  4:47 PM    Result Value Ref Range Status   Specimen Description   Final    PLEURAL RIGHT Performed at [facility] [address] [place], [place] [address]    Special Requests   Final    Normal Performed at [facility], [place] [address] [place], [place] [address]    Gram Stain   Final    ABUNDANT WBC PRESENT,BOTH PMN AND MONONUCLEAR NO ORGANISMS SEEN    Culture   Final    NO GROWTH 3 DAYS Performed at [facility], [place] [address] [place], [place] [address]    Report Status 08/21/2018 FINAL  Final  MRSA PCR Screening     Status: None   Collection Time: 08/17/18  8:11 PM  Result Value Ref Range Status   MRSA by PCR NEGATIVE NEGATIVE Final    Comment:        The GeneXpert MRSA Assay (FDA approved for NASAL specimens only), is one component of a comprehensive MRSA colonization surveillance program. It is not intended to diagnose MRSA infection nor to guide or monitor treatment for MRSA infections. Performed at [facility], [place] [address] [place], [place] [address]     Radiology Reports Dg Chest 1 View  Result Date: 08/17/2018 CLINICAL DATA:  Right-sided thoracentesis EXAM: CHEST  1 VIEW COMPARISON:  Portable chest x-ray of 08/17/2018 FINDINGS: A right thoracentesis has been performed. No pneumothorax is seen. Mild volume loss at the left lung base remains with small left effusion. Cardiomegaly is stable. Left PICC line tip is seen to the mid lower SVC. IMPRESSION: 1. No pneumothorax after right thoracentesis. 2. Little change in opacity at the left lung base consistent with atelectasis and left effusion. Electronically Signed   By: Ivar Drape M.D.   On: 08/17/2018 16:23   Ct Angio Chest Pe W And/or Wo Contrast  Result Date: 08/17/2018 CLINICAL DATA:  Low hemoglobin with pneumonia. Dyspnea and fever. EXAM: CT ANGIOGRAPHY CHEST WITH CONTRAST  TECHNIQUE: Multidetector CT imaging of the chest was performed using the  standard protocol during bolus administration of intravenous contrast. Multiplanar CT image reconstructions and MIPs were obtained to evaluate the vascular anatomy. CONTRAST:  121m ISOVUE-370 IOPAMIDOL (ISOVUE-370) INJECTION 76% COMPARISON:  08/17/2018 CXR, chest CT 06/17/2018 FINDINGS: Cardiovascular: Conventional branch pattern of the great vessels with minimal atherosclerosis at the origin of the left subclavian. No aortic aneurysm or dissection. Mild aortic atherosclerosis without aneurysm or dissection. Scattered coronary arteriosclerosis. Satisfactory opacification of the pulmonary arteries to the proximal segmental level without pulmonary embolus. Heart size is mildly enlarged without pericardial effusion or thickening. Mediastinum/Nodes: Patent trachea and mainstem bronchi. No thyroid mass. The CT appearance of the esophagus is unremarkable. No axillary, supraclavicular, mediastinal nor hilar adenopathy. Lungs/Pleura: New moderate to large right pleural effusion with trace to small left pleural effusion. Adjacent bibasilar and compressive atelectasis is noted. Atelectasis and/or scarring is also seen in the lingula. Respiratory motion artifact slightly limit assessment of the lung bases. No pneumothorax. No dominant mass. There appear to be scattered calcified pleural plaque along the periphery of the left hemithorax. Upper Abdomen: Cholecystectomy. 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 Increased fecal retention within the included colon. Musculoskeletal: Spondylosis of the dorsal spine. No acute nor aggressive osseous lesions. Review of the MIP images confirms the above findings. IMPRESSION: 1. No acute pulmonary embolus, aortic aneurysm or dissection. 2. New moderate to large right pleural effusion with trace to small left pleural effusion. There is adjacent compressive and subsegmental atelectasis at each lung base and lingula. Aortic Atherosclerosis (ICD10-I70.0). Electronically  Signed   By: DAshley RoyaltyM.D.   On: 08/17/2018 14:34   Dg Chest Port 1 View  Result Date: 08/25/2018 CLINICAL DATA:  Pleural effusion. EXAM: PORTABLE CHEST 1 VIEW COMPARISON:  08/17/2018 FINDINGS: 0517 hours. The cardio pericardial silhouette is enlarged. There is pulmonary vascular congestion without overt pulmonary edema. Interval progression of airspace disease at the right base with persistent left base collapse/consolidation. Small bilateral pleural effusions are evident. Telemetry leads overlie the chest. IMPRESSION: Cardiomegaly with bibasilar collapse/consolidation, progressive on the right. Small bilateral pleural effusions. Electronically Signed   By: EMisty StanleyM.D.   On: 08/25/2018 07:54   Dg Chest Portable 1 View  Result Date: 08/17/2018 CLINICAL DATA:  Right-sided chest pain EXAM: PORTABLE CHEST 1 VIEW COMPARISON:  CT chest 06/17/2018 FINDINGS: Small left pleural effusion. Bilateral mild interstitial thickening no focal consolidation or pneumothorax. Stable cardiomegaly. Left-sided PICC line with the tip projecting over the SVC. Moderate osteoarthritis of the right shoulder. IMPRESSION: 1. Cardiomegaly with mild pulmonary vascular congestion. Electronically Signed   By: HKathreen Devoid  On: 08/17/2018 11:44   UKoreaThoracentesis Asp Pleural Space W/img Guide  Result Date: 08/17/2018 INDICATION: Patient with history of pneumonia, chronic left hip infection, bilateral pleural effusions right greater than left. Request made for diagnostic and therapeutic right thoracentesis. EXAM: ULTRASOUND GUIDED DIAGNOSTIC AND THERAPEUTIC RIGHT THORACENTESIS MEDICATIONS: None COMPLICATIONS: None immediate. PROCEDURE: An ultrasound guided thoracentesis was thoroughly discussed with the patient and questions answered. The benefits, risks, alternatives and complications were also discussed. The patient understands and wishes to proceed with the procedure. Written consent was obtained. Ultrasound was performed  to localize and mark an adequate pocket of fluid in the right chest. The area was then prepped and draped in the normal sterile fashion. 1% Lidocaine was used for local anesthesia. Under ultrasound guidance a 6 Fr Safe-T-Centesis catheter was introduced. Thoracentesis was performed. The catheter was removed  and a dressing applied. FINDINGS: A total of approximately 830 cc of hazy, amber fluid was removed. Samples were sent to the laboratory as requested by the clinical team. IMPRESSION: Successful ultrasound guided diagnostic and therapeutic right thoracentesis yielding 830 cc of pleural fluid. Read by: Rowe Robert, PA-C Electronically Signed   By: Jacqulynn Cadet M.D.   On: 08/17/2018 16:18     CBC Recent Labs  Lab 08/19/18 0841 08/21/18 0433 08/22/18 0403 08/25/18 0341  WBC 6.6 6.5 6.3 6.7  HGB 8.6* 8.2* 8.1* 8.1*  HCT 28.9* 28.4* 28.5* 28.3*  PLT 260 246 265 279  MCV 89.5 92.2 93.8 93.4  MCH 26.6 26.6 26.6 26.7  MCHC 29.8* 28.9* 28.4* 28.6*  RDW 16.5* 16.1* 16.4* 16.3*    Chemistries  Recent Labs  Lab 08/19/18 0841 08/22/18 0403 08/25/18 0341  NA 141 143 143  K 3.8 4.1 4.3  CL 106 108 108  CO2 27 28 27   GLUCOSE 105* 102* 100*  BUN 16 15 18   CREATININE 0.89 0.84 0.89  CALCIUM 8.5* 8.6* 8.6*   ------------------------------------------------------------------------------------------------------------------    Component Value Date/Time   BNP 373.9 (H) 08/17/2018 1106    Berle Mull M.D on 08/25/2018 at 3:24 PM  Go to [email or web address] - password [email or web address] for contact info  [facility] - Office  [phone]

## 2018-08-26 ENCOUNTER — Inpatient Hospital Stay (HOSPITAL_COMMUNITY): Payer: Medicare Other

## 2018-08-26 LAB — BODY FLUID CELL COUNT WITH DIFFERENTIAL
Eos, Fluid: 15 %
Lymphs, Fluid: 75 %
Monocyte-Macrophage-Serous Fluid: 3 % — ABNORMAL LOW (ref 50–90)
Neutrophil Count, Fluid: 7 % (ref 0–25)
Total Nucleated Cell Count, Fluid: 1131 cu mm — ABNORMAL HIGH (ref 0–1000)

## 2018-08-26 LAB — LACTATE DEHYDROGENASE, PLEURAL OR PERITONEAL FLUID: LD, Fluid: 162 U/L — ABNORMAL HIGH (ref 3–23)

## 2018-08-26 LAB — CBC WITH DIFFERENTIAL/PLATELET
Abs Immature Granulocytes: 0.02 10*3/uL (ref 0.00–0.07)
Basophils Absolute: 0 10*3/uL (ref 0.0–0.1)
Basophils Relative: 1 %
Eosinophils Absolute: 0.4 10*3/uL (ref 0.0–0.5)
Eosinophils Relative: 7 %
HCT: 29.1 % — ABNORMAL LOW (ref 39.0–52.0)
Hemoglobin: 8.4 g/dL — ABNORMAL LOW (ref 13.0–17.0)
Immature Granulocytes: 0 %
Lymphocytes Relative: 24 %
Lymphs Abs: 1.5 10*3/uL (ref 0.7–4.0)
MCH: 26.3 pg (ref 26.0–34.0)
MCHC: 28.9 g/dL — ABNORMAL LOW (ref 30.0–36.0)
MCV: 90.9 fL (ref 80.0–100.0)
Monocytes Absolute: 0.6 10*3/uL (ref 0.1–1.0)
Monocytes Relative: 10 %
Neutro Abs: 3.5 10*3/uL (ref 1.7–7.7)
Neutrophils Relative %: 58 %
Platelets: 329 10*3/uL (ref 150–400)
RBC: 3.2 MIL/uL — ABNORMAL LOW (ref 4.22–5.81)
RDW: 16.1 % — ABNORMAL HIGH (ref 11.5–15.5)
WBC: 6.1 10*3/uL (ref 4.0–10.5)
nRBC: 0 % (ref 0.0–0.2)

## 2018-08-26 LAB — ALBUMIN, PLEURAL OR PERITONEAL FLUID: Albumin, Fluid: 1.3 g/dL

## 2018-08-26 LAB — BASIC METABOLIC PANEL
Anion gap: 7 (ref 5–15)
BUN: 18 mg/dL (ref 8–23)
CO2: 29 mmol/L (ref 22–32)
Calcium: 8.9 mg/dL (ref 8.9–10.3)
Chloride: 106 mmol/L (ref 98–111)
Creatinine, Ser: 0.91 mg/dL (ref 0.61–1.24)
GFR calc Af Amer: >60 mL/min (ref >60)
GFR calc non Af Amer: >60 mL/min (ref >60)
Glucose, Bld: 99 mg/dL (ref 70–99)
Potassium: 4.2 mmol/L (ref 3.5–5.1)
Sodium: 142 mmol/L (ref 135–145)

## 2018-08-26 LAB — PROTEIN, PLEURAL OR PERITONEAL FLUID: Total protein, fluid: <3 g/dL

## 2018-08-26 LAB — GLUCOSE, PLEURAL OR PERITONEAL FLUID: Glucose, Fluid: 118 mg/dL

## 2018-08-26 LAB — GRAM STAIN

## 2018-08-26 LAB — ANCA TITERS
Atypical P-ANCA titer: <1:20 {titer}
C-ANCA: <1:20 {titer}
P-ANCA: <1:20 {titer}

## 2018-08-26 LAB — IRON AND TIBC
Iron: 31 ug/dL — ABNORMAL LOW (ref 45–182)
Saturation Ratios: 12 % — ABNORMAL LOW (ref 17.9–39.5)
TIBC: 252 ug/dL (ref 250–450)
UIBC: 221 ug/dL

## 2018-08-26 LAB — RETICULOCYTES
Immature Retic Fract: 26.7 % — ABNORMAL HIGH (ref 2.3–15.9)
RBC.: 3.2 MIL/uL — ABNORMAL LOW (ref 4.22–5.81)
Retic Count, Absolute: 40.3 10*3/uL (ref 19.0–186.0)
Retic Ct Pct: 1.3 % (ref 0.4–3.1)

## 2018-08-26 LAB — VITAMIN B12: Vitamin B-12: 282 pg/mL (ref 180–914)

## 2018-08-26 LAB — FERRITIN: Ferritin: 57 ng/mL (ref 24–336)

## 2018-08-26 LAB — PROTIME-INR
INR: 1.46
Prothrombin Time: 17.6 seconds — ABNORMAL HIGH (ref 11.4–15.2)

## 2018-08-26 LAB — MPO/PR-3 (ANCA) ANTIBODIES
ANCA Proteinase 3: <3.5 U/mL (ref 0.0–3.5)
Myeloperoxidase Abs: <9 U/mL (ref 0.0–9.0)

## 2018-08-26 LAB — LACTATE DEHYDROGENASE: LDH: 132 U/L (ref 98–192)

## 2018-08-26 LAB — ANA W/REFLEX IF POSITIVE: Anti Nuclear Antibody(ANA): NEGATIVE

## 2018-08-26 MED ORDER — CYANOCOBALAMIN 1000 MCG/ML IJ SOLN
1000.0000 ug | Freq: Once | INTRAMUSCULAR | Status: AC
  Administered 2018-08-26: 1000 ug via SUBCUTANEOUS
  Filled 2018-08-26: qty 1

## 2018-08-26 MED ORDER — LIDOCAINE HCL 1 % IJ SOLN
INTRAMUSCULAR | Status: AC
  Filled 2018-08-26: qty 20

## 2018-08-26 MED ORDER — FERROUS SULFATE 325 (65 FE) MG PO TABS
325.0000 mg | ORAL_TABLET | Freq: Three times a day (TID) | ORAL | Status: DC
  Administered 2018-08-26 – 2018-09-02 (×23): 325 mg via ORAL
  Filled 2018-08-26 (×22): qty 1

## 2018-08-26 NOTE — Progress Notes (Signed)
Patient Demographics:    [name], is a 72 y.o. male, DOB - 1945-12-04, RRN:[id number]  Admit date - 08/17/2018   Admitting Physician No admitting provider for patient encounter.  Outpatient Primary MD for the patient is [name], MD  LOS - 9   Chief Complaint  Patient presents with  . Abnormal Lab        Subjective:   No nausea no vomiting.  Breathing is somewhat better but still short.  Cough still present.  No diarrhea.   Assessment  & Plan :    Principal Problem:   Atrial fibrillation with RVR (HCC) Active Problems:   Left hip postoperative wound infection   SIRS (systemic inflammatory response syndrome) (HCC)   Anemia of chronic disease   Depression   OSA (obstructive sleep apnea)   Obesity, Class III, BMI 40-49.9 (morbid obesity) (HCC)   Hypertensive urgency   Pleural effusion on left   Acute on chronic diastolic (congestive) heart failure (HCC)   OSA on CPAP  Brief Summary 72 y.o. male, with history of hypertension, A. 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 fib with RVR and also found to have right-sided pleural effusion, IR removed 830 mL of clear fluid, awaiting insurance approval for transfer back to SNF rehab   Plan:- 1) Chronic Atrial Fibrillation with RVR improved rate control, continue Cardizem CD 180 mg daily, and metoprolol  25 mg bid,  last known EF was 50 to 55% , continue Eliquis for anticoagulation  2)HFpEF with right-sided pleural effusion patient with history of chronic diastolic dysfunction CHF suspect exacerbation secondary to A. fib with RVR, improved post thoracentesis on 08/17/2018 with removal of 830 mL of clear fluid (fluid Gram stain and culture negative so  far), fluid LDH 470 serum LDH 146, exudative effusion by Light's criteria,  PCCM appreciated, continue Lasix 40 mg twice daily, daily weights and strict I's and O's, supplemental oxygen as needed When repeat thoracentesis on 08/26/2018- is still concerning for exudative effusion.  Follow-up further work-up.  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----patient usually follows with infectious disease at [facility],  Completed IV ertapenem through 08/21/2018, ID physician at [facility] recommends Flagyl 500 twice daily for Bacteroides fragilis keep infection starting 08/22/2018 for 90 days, ESR is down to 60 from 107, CRP is down to 4.1 from 23.6  4)Morbid Obesity with OSA-- c/n CPAP nightly  5)DEpression----stable,  Seroquel and Remeron were held due to concerns about QT prolongation,  continue Lexapro   6)Chronic Anemia----etiology unclear, stool is Hemoccult positive at this time  , patient on Eliquis due to history of A. Fib , hemoglobin is currently 8.2, monitor H&H closely, continue Protonix Iron level still low.  Will increase oral iron supplementation as well as continue B12. B12 is relatively low.  Replaced with subcu injection x1.  7)Generalized Weakness/Debility--- PT eval appreciated, recommends SNF rehab awaiting insurance approval for SNF Rehab discharge ,  fatigue persist, difficulties with mobility related activities of daily living persist,, dyspnea on exertion with minimal activity persist   Disposition/Need for in-Hospital Stay- patient  unable to be discharged at this time due to awaiting insurance approval for SNF rehab transfer   Code Status : Full  Disposition Plan  : SNF Rehab  DVT Prophylaxis  : Eliquis  Lab Results  Component Value Date   PLT 329 08/26/2018    Inpatient Medications  Scheduled Meds: . apixaban  5 mg Oral BID  . cholecalciferol  2,000 Units Oral QPM  . cycloSPORINE  1 drop Both Eyes BID  . diltiazem  180 mg  Oral Daily  . escitalopram  10 mg Oral Daily  . ferrous sulfate  325 mg Oral TID WC  . gabapentin  100 mg Oral TID  . Gerhardt's butt cream   Topical BID  . lidocaine      . Melatonin  6 mg Oral QHS  . metoprolol tartrate  25 mg Oral BID  . metroNIDAZOLE  500 mg Oral BID  . multivitamin with minerals  1 tablet Oral QPM  . pantoprazole  40 mg Oral Daily  . rOPINIRole  0.25 mg Oral QHS  . senna-docusate  2 tablet Oral BID  . sodium chloride flush  10-40 mL Intracatheter Q12H  . thiamine  100 mg Oral Daily   Continuous Infusions:  PRN Meds:.acetaminophen **OR** acetaminophen, calcium carbonate, hydrOXYzine, ipratropium-albuterol, menthol-cetylpyridinium, ondansetron **OR** ondansetron (ZOFRAN) IV, oxyCODONE, phenol, polyethylene glycol, polyvinyl alcohol, sodium chloride flush    Anti-infectives (From admission, onward)   Start     Dose/Rate Route Frequency Ordered Stop   08/22/18 1000  metroNIDAZOLE (FLAGYL) tablet 500 mg     500 mg Oral 2 times daily 08/21/18 1511 11/20/18 0959   08/21/18 1515  metroNIDAZOLE (FLAGYL) tablet 500 mg  Status:  Discontinued     500 mg Oral 2 times daily 08/21/18 1510 08/21/18 1511   08/17/18 1730  ertapenem (INVANZ) 1,000 mg in sodium chloride 0.9 % 100 mL IVPB  Status:  Discontinued     1 g 200 mL/hr over 30 Minutes Intravenous Every 24 hours 08/17/18 1720 08/17/18 1728   08/17/18 1200  ertapenem (INVANZ) 1,000 mg in sodium chloride 0.9 % 100 mL IVPB     1 g 200 mL/hr over 30 Minutes Intravenous Every 24 hours 08/17/18 1036 08/21/18 1308        Objective:   Vitals:   08/26/18 1220 08/26/18 1230 08/26/18 1235 08/26/18 1312  BP: (!) 89/54 (!) 80/53 (!) 76/50 111/76  Pulse:    91  Resp:    19  Temp:    98.2 F (36.8 C)  TempSrc:    Oral  SpO2:    92%  Weight:      Height:        Wt Readings from Last 3 Encounters:  08/26/18 (!) 144.7 kg  04/24/18 (!) 163.6 kg  03/17/18 (!) 155.6 kg     Intake/Output Summary (Last 24 hours) at  08/26/2018 1906 Last data filed at 08/26/2018 1602 Gross per 24 hour  Intake 380 ml  Output 1025 ml  Net -645 ml     Physical Exam Gen:- Awake Alert, obese, in no acute distress HEENT:- [place].AT, No sclera icterus Neck-Supple Neck,No JVD,.  Lungs-improved air movement , no wheezing or rales CV- S1, S2 normal, irregularly irregular  abd-  +ve B.Sounds, Abd Soft, No tenderness, increased truncal adiposity    Extremity/Skin:-  pedal pulses present  Psych-affect is appropriate, oriented x3 Neuro-generalized weakness but no new focal deficits, no tremors MSK-left hip wound appears to have healed nicely  Data Review:  Micro Results Recent Results (from the past 240 hour(s))  Blood culture (routine x 2)     Status: None   Collection Time: 08/17/18 11:06 AM  Result Value Ref Range Status   Specimen Description   Final    BLOOD RIGHT ARM Performed at [facility] [address] [place], [place] [address]    Special Requests   Final    BOTTLES DRAWN AEROBIC AND ANAEROBIC Blood Culture adequate volume Performed at [facility] [address], [place], [place] [address]    Culture   Final    NO GROWTH 5 DAYS Performed at [facility], [place] [address] [place], [place] [address]    Report Status 08/22/2018 FINAL  Final  Blood culture (routine x 2)     Status: None   Collection Time: 08/17/18 12:00 PM  Result Value Ref Range Status   Specimen Description   Final    BLOOD RIGHT ANTECUBITAL Performed at [facility] [address], [place], [place] [address]    Special Requests   Final    BOTTLES DRAWN AEROBIC ONLY Blood Culture results may not be optimal due to an inadequate volume of blood received in culture bottles Performed at [facility] [address], [place], [place] [address]    Culture   Final    NO GROWTH 5 DAYS Performed at [facility], [place] [address] [place], [place]  [address]    Report Status 08/22/2018 FINAL  Final  Body fluid culture     Status: None   Collection Time: 08/17/18  4:47 PM  Result Value Ref Range Status   Specimen Description   Final    PLEURAL RIGHT Performed at [facility] [address] [place], [place] [address]    Special Requests   Final    Normal Performed at [facility], [place] [address] [place], [place] [address]    Gram Stain   Final    ABUNDANT WBC PRESENT,BOTH PMN AND MONONUCLEAR NO ORGANISMS SEEN    Culture   Final    NO GROWTH 3 DAYS Performed at [facility], [place] [address] [place], [place] [address]    Report Status 08/21/2018 FINAL  Final  MRSA PCR Screening     Status: None   Collection Time: 08/17/18  8:11 PM  Result Value Ref Range Status   MRSA by PCR NEGATIVE NEGATIVE Final    Comment:        The GeneXpert MRSA Assay (FDA approved for NASAL specimens only), is one component of a comprehensive MRSA colonization surveillance program. It is not intended to diagnose MRSA infection nor to guide or monitor treatment for MRSA infections. Performed at [facility], [place] [address] [place], [place] [address]   Gram stain     Status: None   Collection Time: 08/26/18 12:52 PM  Result Value Ref Range Status   Specimen Description FLUID PLEURAL RIGHT  Final   Special Requests NONE  Final   Gram Stain   Final    CYTOSPIN SMEAR WBC PRESENT,BOTH PMN AND MONONUCLEAR NO ORGANISMS SEEN Performed at [facility], [place] [address], [place], [place] [address]    Report Status 08/26/2018 FINAL  Final    Radiology Reports Dg Chest 1 View  Result Date: 08/26/2018 CLINICAL DATA:  Status post right thoracentesis today. EXAM: CHEST  1 VIEW COMPARISON:  Single-view of the chest 12/22  scratch the single view of the chest earlier today. FINDINGS: Right pleural effusion is markedly decreased after thoracentesis. No pneumothorax. Left effusion and  airspace disease persist. There is cardiomegaly and vascular congestion. IMPRESSION: Marked decrease in right pleural effusion after thoracentesis. Negative for pneumothorax. No change in a left pleural effusion and airspace disease. Cardiomegaly and vascular congestion. Electronically Signed   By: Inge Rise M.D.   On: 08/26/2018 13:01   Dg Chest 1 View  Result Date: 08/17/2018 CLINICAL DATA:  Right-sided thoracentesis EXAM: CHEST  1 VIEW COMPARISON:  Portable chest x-ray of 08/17/2018 FINDINGS: A right thoracentesis has been performed. No pneumothorax is seen. Mild volume loss at the left lung base remains with small left effusion. Cardiomegaly is stable. Left PICC line tip is seen to the mid lower SVC. IMPRESSION: 1. No pneumothorax after right thoracentesis. 2. Little change in opacity at the left lung base consistent with atelectasis and left effusion. Electronically Signed   By: Ivar Drape M.D.   On: 08/17/2018 16:23   Ct Angio Chest Pe W And/or Wo Contrast  Result Date: 08/17/2018 CLINICAL DATA:  Low hemoglobin with pneumonia. Dyspnea and fever. EXAM: CT ANGIOGRAPHY CHEST WITH CONTRAST TECHNIQUE: Multidetector CT imaging of the chest was performed using the standard protocol during bolus administration of intravenous contrast. Multiplanar CT image reconstructions and MIPs were obtained to evaluate the vascular anatomy. CONTRAST:  179m ISOVUE-370 IOPAMIDOL (ISOVUE-370) INJECTION 76% COMPARISON:  08/17/2018 CXR, chest CT 06/17/2018 FINDINGS: Cardiovascular: Conventional branch pattern of the great vessels with minimal atherosclerosis at the origin of the left subclavian. No aortic aneurysm or dissection. Mild aortic atherosclerosis without aneurysm or dissection. Scattered coronary arteriosclerosis. Satisfactory opacification of the pulmonary arteries to the proximal segmental level without pulmonary embolus. Heart size is mildly enlarged without pericardial effusion or thickening.  Mediastinum/Nodes: Patent trachea and mainstem bronchi. No thyroid mass. The CT appearance of the esophagus is unremarkable. No axillary, supraclavicular, mediastinal nor hilar adenopathy. Lungs/Pleura: New moderate to large right pleural effusion with trace to small left pleural effusion. Adjacent bibasilar and compressive atelectasis is noted. Atelectasis and/or scarring is also seen in the lingula. Respiratory motion artifact slightly limit assessment of the lung bases. No pneumothorax. No dominant mass. There appear to be scattered calcified pleural plaque along the periphery of the left hemithorax. Upper Abdomen: Cholecystectomy. 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 Increased fecal retention within the included colon. Musculoskeletal: Spondylosis of the dorsal spine. No acute nor aggressive osseous lesions. Review of the MIP images confirms the above findings. IMPRESSION: 1. No acute pulmonary embolus, aortic aneurysm or dissection. 2. New moderate to large right pleural effusion with trace to small left pleural effusion. There is adjacent compressive and subsegmental atelectasis at each lung base and lingula. Aortic Atherosclerosis (ICD10-I70.0). Electronically Signed   By: DAshley RoyaltyM.D.   On: 08/17/2018 14:34   Dg Chest Port 1 View  Result Date: 08/26/2018 CLINICAL DATA:  72year old male status post ultrasound-guided right side thoracentesis on 08/17/2018. EXAM: PORTABLE CHEST 1 VIEW COMPARISON:  08/25/2018 and earlier. FINDINGS: Portable AP semi upright view at 0451 hours. Stable left PICC line. Stable lung volumes and mediastinal contours. Increased veiling opacity at the right lung base since yesterday partially obscuring the right diaphragm. Persistent small left pleural effusion suspected. No pneumothorax. Stable pulmonary vascularity without acute edema. IMPRESSION: 1. Suspect re-accumulating right pleural effusion. Stable small left pleural effusion. 2. No new  cardiopulmonary abnormality. Electronically Signed   By: HHerminio HeadsD.  On: 08/26/2018 07:54   Dg Chest Port 1 View  Result Date: 08/25/2018 CLINICAL DATA:  Pleural effusion. EXAM: PORTABLE CHEST 1 VIEW COMPARISON:  08/17/2018 FINDINGS: 0517 hours. The cardio pericardial silhouette is enlarged. There is pulmonary vascular congestion without overt pulmonary edema. Interval progression of airspace disease at the right base with persistent left base collapse/consolidation. Small bilateral pleural effusions are evident. Telemetry leads overlie the chest. IMPRESSION: Cardiomegaly with bibasilar collapse/consolidation, progressive on the right. Small bilateral pleural effusions. Electronically Signed   By: Misty Stanley M.D.   On: 08/25/2018 07:54   Dg Chest Portable 1 View  Result Date: 08/17/2018 CLINICAL DATA:  Right-sided chest pain EXAM: PORTABLE CHEST 1 VIEW COMPARISON:  CT chest 06/17/2018 FINDINGS: Small left pleural effusion. Bilateral mild interstitial thickening no focal consolidation or pneumothorax. Stable cardiomegaly. Left-sided PICC line with the tip projecting over the SVC. Moderate osteoarthritis of the right shoulder. IMPRESSION: 1. Cardiomegaly with mild pulmonary vascular congestion. Electronically Signed   By: Kathreen Devoid   On: 08/17/2018 11:44   US Thoracentesis Asp Pleural Space W/img Guide  Result Date: 08/26/2018 INDICATION: History of pneumonia with recurrent symptomatic right-sided pleural effusion. Please perform ultrasound-guided thoracentesis for diagnostic and therapeutic purposes. EXAM: US THORACENTESIS ASP PLEURAL SPACE W/IMG GUIDE COMPARISON:  Right-sided ultrasound-guided thoracentesis-08/17/2018 yielding 830 cc of AC, amber colored fluid. Chest radiograph-earlier same day; 08/17/2018; chest CT-08/17/2018 MEDICATIONS: None. COMPLICATIONS: None immediate. TECHNIQUE: Informed written consent was obtained from the patient after a discussion of the risks, benefits and  alternatives to treatment. A timeout was performed prior to the initiation of the procedure. Patient was unable to sit upright on the edge of his hospital bed and as such was positioned left lateral decubitus. Initial ultrasound scanning demonstrates a recurrent moderate sized anechoic right-sided pleural effusion. The lower chest was prepped and draped in the usual sterile fashion. 1% lidocaine was used for local anesthesia. Under direct ultrasound guidance, a 19 gauge, 7-cm, Yueh catheter was introduced. An ultrasound image was saved for documentation purposes. The thoracentesis was performed. The catheter was removed and a dressing was applied. The patient tolerated the procedure well without immediate post procedural complication. The patient was escorted to have an upright chest radiograph. FINDINGS: A total of approximately 1.4 liters of serous, slightly blood tinged fluid was removed. Requested samples were sent to the laboratory. IMPRESSION: Successful ultrasound-guided right sided thoracentesis yielding 1.4 liters of serous, slightly blood tinged pleural fluid. Electronically Signed   By: Sandi Mariscal M.D.   On: 08/26/2018 13:28   US Thoracentesis Asp Pleural Space W/img Guide  Result Date: 08/17/2018 INDICATION: Patient with history of pneumonia, chronic left hip infection, bilateral pleural effusions right greater than left. Request made for diagnostic and therapeutic right thoracentesis. EXAM: ULTRASOUND GUIDED DIAGNOSTIC AND THERAPEUTIC RIGHT THORACENTESIS MEDICATIONS: None COMPLICATIONS: None immediate. PROCEDURE: An ultrasound guided thoracentesis was thoroughly discussed with the patient and questions answered. The benefits, risks, alternatives and complications were also discussed. The patient understands and wishes to proceed with the procedure. Written consent was obtained. Ultrasound was performed to localize and mark an adequate pocket of fluid in the right chest. The area was then prepped  and draped in the normal sterile fashion. 1% Lidocaine was used for local anesthesia. Under ultrasound guidance a 6 Fr Safe-T-Centesis catheter was introduced. Thoracentesis was performed. The catheter was removed and a dressing applied. FINDINGS: A total of approximately 830 cc of hazy, amber fluid was removed. Samples were sent to the laboratory as requested by the  clinical team. IMPRESSION: Successful ultrasound guided diagnostic and therapeutic right thoracentesis yielding 830 cc of pleural fluid. Read by: Rowe Robert, PA-C Electronically Signed   By: Jacqulynn Cadet M.D.   On: 08/17/2018 16:18     CBC Recent Labs  Lab 08/21/18 0433 08/22/18 0403 08/25/18 0341 08/26/18 0449  WBC 6.5 6.3 6.7 6.1  HGB 8.2* 8.1* 8.1* 8.4*  HCT 28.4* 28.5* 28.3* 29.1*  PLT 246 265 279 329  MCV 92.2 93.8 93.4 90.9  MCH 26.6 26.6 26.7 26.3  MCHC 28.9* 28.4* 28.6* 28.9*  RDW 16.1* 16.4* 16.3* 16.1*  LYMPHSABS  --   --   --  1.5  MONOABS  --   --   --  0.6  EOSABS  --   --   --  0.4  BASOSABS  --   --   --  0.0    Chemistries  Recent Labs  Lab 08/22/18 0403 08/25/18 0341 08/26/18 0449  NA 143 143 142  K 4.1 4.3 4.2  CL 108 108 106  CO2 28 27 29   GLUCOSE 102* 100* 99  BUN 15 18 18   CREATININE 0.84 0.89 0.91  CALCIUM 8.6* 8.6* 8.9   ------------------------------------------------------------------------------------------------------------------    Component Value Date/Time   BNP 373.9 (H) 08/17/2018 1106    Berle Mull M.D on 08/26/2018 at 7:06 PM  Go to [email or web address] - password [email or web address] for contact info  [facility] - Office  [phone]

## 2018-08-26 NOTE — Progress Notes (Signed)
Physical Therapy Treatment Patient Details Name: [name] MRN: [id number] DOB: 11/09/1945 Today's Date: 08/26/2018    History of Present Illness 72 y.o. male, with history of hypertension, A. fib on Eliquis, giant cell arteritis, OSA on CPAP, left total hip arthroplasty, L hip infection, anxiety, depression, morbid obesity; recent prolonged hospital stay at [facility] with  altered mental status, respiratory distress requiring intubation, was then transferred to [facility],  extubated at [facility] where he had further prolonged stay with delirium, had a fall from bed injuring his left hip with x-ray showing new linear linear peri-prosthetic fracture; admitted to [facility] on 08/17/18 with fatigue and cough, was found to be in afib in ED; underwent thoracentesis 12/17      PT Comments    Pt in an oversized Air Bed for pressure relief however he is unable to self reposition and or offer much assist to perform any bed mobility.  Pt would benefit from a regular hospital bed with an over bed trapeze.  Assisted OOB to amb however only able to amb 8 feet due to MAX c/o fatigue.  Also noted 4/4 dyspnea with RA avg 95% and HR 88.  Assisted back to bed per pt request.    Follow Up Recommendations  SNF     Equipment Recommendations  None recommended by PT    Recommendations for Other Services       Precautions / Restrictions Precautions Precautions: Fall Restrictions Other Position/Activity Restrictions: per pt he is allowed WBAT; periprosthetic was > 2 mos ago per notes, pt was amb at [place] bed mobility: Needs Assistance Bed Mobility: Supine to Sit     Supine to sit: Min assist;HOB elevated     General bed mobility comments: assist with upper body due to ABD girth and great difficulty self performing due to air mattress  Transfers Overall transfer level: Needs assistance Equipment used: Rolling walker (2 wheeled) Transfers: Sit  to/from Stand Sit to Stand: Min assist;+2 safety/equipment;From elevated surface Stand pivot transfers: Min assist;+2 physical assistance;+2 safety/equipment       General transfer comment: pt tends to use forward momentum and required increased assist from lower level recliner vs elevated bed.    Ambulation/Gait Ambulation/Gait assistance: Min guard;+2 safety/equipment Gait Distance (Feet): 8 Feet Assistive device: Rolling walker (2 wheeled) Gait Pattern/deviations: Step-to pattern;Wide base of support Gait velocity: decreased    General Gait Details: limited amb distance due max c/o fatigue and 3/4 dyspnea.  Required an extended seated rest break before attempting a transfer back to bed.     Stairs             Wheelchair Mobility    Modified Rankin (Stroke Patients Only)       Balance                                            Cognition Arousal/Alertness: Awake/alert Behavior During Therapy: WFL for tasks assessed/performed Overall Cognitive Status: Within Functional Limits for tasks assessed                                        Exercises      General Comments        Pertinent Vitals/Pain Pain Assessment: Faces Faces  Pain Scale: Hurts a little bit Pain Location: L flank (recent Thorocentesis) Pain Descriptors / Indicators: Discomfort Pain Intervention(s): Monitored during session;Repositioned    Home Living                      Prior Function            PT Goals (current goals can now be found in the care plan section) Progress towards PT goals: Progressing toward goals    Frequency    Min 2X/week      PT Plan Current plan remains appropriate    Co-evaluation              AM-PAC PT "6 Clicks" Mobility   Outcome Measure  Help needed turning from your back to your side while in a flat bed without using bedrails?: A Lot Help needed moving from lying on your back to sitting on the side  of a flat bed without using bedrails?: A Lot Help needed moving to and from a bed to a chair (including a wheelchair)?: A Lot Help needed standing up from a chair using your arms (e.g., wheelchair or bedside chair)?: A Lot Help needed to walk in hospital room?: A Lot Help needed climbing 3-5 steps with a railing? : Total 6 Click Score: 11    End of Session Equipment Utilized During Treatment: Gait belt Activity Tolerance: Other (comment)(dyspnea ) Patient left: with call bell/phone within reach;in chair Nurse Communication: Mobility status PT Visit Diagnosis: Other abnormalities of gait and mobility (R26.89);Muscle weakness (generalized) (M62.81)     Time: 8101-7510 PT Time Calculation (min) (ACUTE ONLY): 27 min  Charges:  $Gait Training: 8-22 mins $Therapeutic Activity: 8-22 mins                     Rica Koyanagi  PTA Acute  Rehabilitation Services Pager      [phone] Office      [phone]

## 2018-08-26 NOTE — Progress Notes (Signed)
NAME:  [name], MRN:  [id number], DOB:  [birth year], LOS: 9 ADMISSION DATE:  08/17/2018, CONSULTATION DATE:  08/24/2018 REFERRING MD:  Hermelinda Dellen, CHIEF COMPLAINT:  Pleural effusion   Brief History   72 year old male with giant cell arteritis now off prednisone who just had a very complicated course in [facility] to include hip fracture after a fall, intubation and delirium, please see H&P for extensive details.  Patient was brought to [facility] for CP and SOB and was noted to have R>L pleural effusion and the right was tapped.  Pleural fluid analysis was inconsistent and PCCM was called on consultation.  Patient denies any previous history of pleural effusion or thoracentesis in the past but did have new onset rapid a-fib during this admission.    History of present illness   72 year old male with giant cell arteritis now off prednisone who just had a very complicated course in [place] to include hip fracture after a fall, intubation and delirium, please see H&P for extensive details.  Patient was brought to [facility] for CP and SOB and was noted to have R>L pleural effusion and the right was tapped.  Pleural fluid analysis was inconsistent and PCCM was called on consultation.  Patient denies any previous history of pleural effusion or thoracentesis in the past but did have new onset rapid a-fib during this admission.    Past Medical History  See below  Significant Hospital Events   Thora  Consults:  PCCM  Procedures:  Thora 12/17  Significant Diagnostic Tests:  Pleural effusion analysis with protein <3, LDH 473, WBC of 2815 with 40% PMNs and 26% mono, cytology is negative for malignancy  Micro Data:  Pleural 12/17 negative Blood 12/17 negative  Antimicrobials:  Flagyl chronically form [facility]   Interim history/subjective:  No events overnight, no new complaints  Objective   Blood pressure 126/72, pulse 91, temperature 97.8 F (36.6 C), temperature source Oral, resp. rate 16, height  5\' 8"  (1.727 m), weight (!) 144.7 kg, SpO2 91 %.        Intake/Output Summary (Last 24 hours) at 08/26/2018 1105 Last data filed at 08/26/2018 1050 Gross per 24 hour  Intake 620 ml  Output 2125 ml  Net -1505 ml   Filed Weights   08/19/18 0520 08/22/18 0516 08/26/18 0432  Weight: (!) 139.5 kg 111.6 kg (!) 144.7 kg    Examination: General: Chronically ill appearing male, NAD HENT: [place]/AT, PERRL, EOM-I and MMM Lungs: Decreased BS R>L Cardiovascular: RRR, Nl S1/S2 and -M/R/G Abdomen: Soft, NT, ND and +BS Extremities: Fracture repair, -edema and -tenderness Neuro: Alert and interactive, moving all ext to command Skin: intact  I reviewed CXR myself, right sided pleural fluid is reaccumulating   Resolved Hospital Problem list     Assessment & Plan:  72 year old male with exudative pleural effusion by LDH criteria only with cell analysis that is inconsistent with a bacterial infection and negative cytology.  Discussed with PCCM-NP.  Pleural effusion:  - IR to perform thora  - F/U CXR post thora  - Auto-immune panel with CRP of 4.1 and negative ANA so far  - If above is positive may need to consider steroids  - Fluid analysis discussed with TR[facility]-MD  Hypoxemia:  - Titrate O2 for sat of 88-92%, get to off soon, patient does not use home O2  - Thora today  Pulmonary edema:  - Diureses  - F/U imaging as needed  PCCM will continue to follow  Discussed with [facility]-MD  Labs   CBC: Recent Labs  Lab 08/21/18 0433 08/22/18 0403 08/25/18 0341 08/26/18 0449  WBC 6.5 6.3 6.7 6.1  NEUTROABS  --   --   --  3.5  HGB 8.2* 8.1* 8.1* 8.4*  HCT 28.4* 28.5* 28.3* 29.1*  MCV 92.2 93.8 93.4 90.9  PLT 246 265 279 741    Basic Metabolic Panel: Recent Labs  Lab 08/22/18 0403 08/25/18 0341 08/26/18 0449  NA 143 143 142  K 4.1 4.3 4.2  CL 108 108 106  CO2 28 27 29   GLUCOSE 102* 100* 99  BUN 15 18 18   CREATININE 0.84 0.89 0.91  CALCIUM 8.6* 8.6* 8.9   GFR: Estimated  Creatinine Clearance: 102.6 mL/min (by C-G formula based on SCr of 0.91 mg/dL). Recent Labs  Lab 08/21/18 0433 08/22/18 0403 08/25/18 0341 08/26/18 0449  WBC 6.5 6.3 6.7 6.1    Liver Function Tests: No results for input(s): AST, ALT, ALKPHOS, BILITOT, PROT, ALBUMIN in the last 168 hours. No results for input(s): LIPASE, AMYLASE in the last 168 hours. No results for input(s): AMMONIA in the last 168 hours.  ABG No results found for: PHART, PCO2ART, PO2ART, HCO3, TCO2, ACIDBASEDEF, O2SAT   Coagulation Profile: Recent Labs  Lab 08/26/18 0449  INR 1.46    Cardiac Enzymes: No results for input(s): CKTOTAL, CKMB, CKMBINDEX, TROPONINI in the last 168 hours.  HbA1C: No results found for: HGBA1C  CBG: No results for input(s): GLUCAP in the last 168 hours.  Review of Systems:   12 point ROS is negative other than above plus hip pain.  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   Surgical History    Past Surgical History:  Procedure Laterality Date  . APPENDECTOMY    . ARTERY BIOPSY Left 12/30/2017   Procedure: BIOPSY TEMPORAL ARTERY;  Surgeon: Fanny Skates, MD;  Location: [facility];  Service: General;  Laterality: Left;  . CHOLECYSTECTOMY    . COLONOSCOPY W/ POLYPECTOMY    . ESOPHAGOGASTRODUODENOSCOPY (EGD) WITH PROPOFOL  07/06/2012   Procedure: ESOPHAGOGASTRODUODENOSCOPY (EGD) WITH PROPOFOL;  Surgeon: Jeryl Columbia, MD;  Location: [facility];  Service: Endoscopy;  Laterality: N/A;  . ESOPHAGOSCOPY    . EYE SURGERY     left eye- muscle repair  . HERNIA REPAIR     ventral hernia  . INCISION AND DRAINAGE HIP Left 03/17/2018   Procedure: IRRIGATION AND  DEBRIDEMENT LEFT THIGH WOUND;  Surgeon: Gaynelle Arabian, MD;  Location: [facility];  Service: Orthopedics;  Laterality: Left;  . JOINT REPLACEMENT     bilateral hips.  Right broke and had to be re placed again  . MASS EXCISION N/A 03/02/2015   Procedure: EXCISION ABDOMINAL WALL MASS;  Surgeon: Alphonsa Overall, MD;  Location: [place];  Service: General;  Laterality: N/A;  . TOTAL HIP REVISION Left 02/17/2018   Procedure: Left total hip arthroplasty revision;  Surgeon: Gaynelle Arabian, MD;  Location: [facility];  Service: Orthopedics;  Laterality: Left;  Marland Kitchen VERTICAL BANDED GASTROPLASTY       Social History   reports that he quit smoking about 34 years ago. His smoking use included cigarettes. He started smoking about 52 years ago. He has a 20.00 pack-year smoking history. He has never used smokeless  tobacco. He reports current alcohol use. He reports that he does not use drugs.   Family History   His family history includes Alcohol abuse in his father; Cancer in his mother.   Allergies Allergies  Allergen Reactions  . Bee Venom Swelling  . Nickel Rash     Home Medications  Prior to Admission medications   Medication Sig Start Date End Date Taking? Authorizing Provider  acetaminophen (TYLENOL) 325 MG tablet Take 2 tablets (650 mg total) by mouth every 6 (six) hours as needed for mild pain, fever or headache. 04/27/18  Yes Barton Dubois, MD  apixaban (ELIQUIS) 5 MG TABS tablet Take 5 mg by mouth 2 (two) times daily.   Yes [provider]  calcium carbonate (TUMS - DOSED IN MG ELEMENTAL CALCIUM) 500 MG chewable tablet Chew 1,000 mg by mouth 3 (three) times daily as needed for indigestion.   Yes [provider]  carboxymethylcellulose (REFRESH TEARS) 0.5 % SOLN Place 1 drop into both eyes daily as needed (dry eye/irritation).  07/14/18  Yes [provider]  Cholecalciferol (VITAMIN D3) 2000 units TABS Take 2,000 Units by mouth every evening.    Yes [provider]    cycloSPORINE (RESTASIS) 0.05 % ophthalmic emulsion Place 1 drop into both eyes 2 (two) times daily.    Yes [provider]  diltiazem (DILACOR XR) 120 MG 24 hr capsule Take 240 mg by mouth daily.   Yes [provider]  DM-Benzocaine-Menthol (CHLORASEPTIC TOTAL) 01-04-09 MG LOZG Use as directed 1 lozenge in the mouth or throat every 2 (two) hours as needed (sore throat).   Yes [provider]  escitalopram (LEXAPRO) 10 MG tablet Take 10 mg by mouth daily.   Yes [provider]  ferrous sulfate 325 (65 FE) MG tablet Take 325 mg by mouth 2 (two) times daily with a meal.   Yes [provider]  furosemide (LASIX) 20 MG tablet Take 20 mg by mouth daily.   Yes [provider]  gabapentin (NEURONTIN) 100 MG capsule Take 100 mg by mouth 3 (three) times daily.   Yes [provider]  hydrOXYzine (ATARAX/VISTARIL) 25 MG tablet Take 25 mg by mouth 3 (three) times daily as needed for itching or anxiety. 07/14/18  Yes [provider]  ipratropium-albuterol (DUONEB) 0.5-2.5 (3) MG/3ML SOLN Inhale 3 mLs into the lungs every 4 (four) hours as needed for wheezing or shortness of breath. 07/14/18  Yes [provider]  Lidocaine 4 % PTCH Place 3 patches onto the skin See admin instructions. Place 3 patches to left hip & thigh region every morning & remove after 3 hours. 07/15/18  Yes [provider]  Melatonin 3 MG TABS Take 6 mg by mouth at bedtime.   Yes [provider]  metoprolol tartrate (LOPRESSOR) 25 MG tablet Take 25 mg by mouth 2 (two) times daily. 07/14/18  Yes [provider]  mirtazapine (REMERON) 7.5 MG tablet Take 7.5 mg by mouth at bedtime. 07/14/18  Yes [provider]  Multiple Vitamins-Minerals (CENTRUM ADULTS PO) Take 1 tablet by mouth every evening.    Yes [provider]  nystatin (MYCOSTATIN/NYSTOP) powder Apply 1 g topically daily as needed (yeast). Apply to bilateral groin as  needed daily for yeast   Yes [provider]  oxyCODONE (OXY IR/ROXICODONE) 5 MG immediate release tablet Take 1 tablet (5 mg total) by mouth every 6 (six) hours as needed for moderate pain, severe pain or breakthrough pain. 04/27/18  Yes  Barton Dubois, MD  pantoprazole (PROTONIX) 40 MG tablet Take 1 tablet (40 mg total) by mouth daily. 04/28/18  Yes Barton Dubois, MD  polyethylene glycol [facility] / Floria Raveling) packet Take 17 g by mouth daily as needed for mild constipation. Patient taking differently: Take 17 g by mouth daily.  02/23/18  Yes Arrien, Jimmy Picket, MD  QUEtiapine (SEROQUEL) 25 MG tablet Take 75 mg by mouth at bedtime.   Yes [provider]  rOPINIRole (REQUIP) 0.25 MG tablet Take 0.25 mg by mouth at bedtime.   Yes [provider]  senna-docusate (SENOKOT-S) 8.6-50 MG tablet Take 2 tablets by mouth 2 (two) times daily. 07/14/18  Yes [provider]  thiamine 100 MG tablet Take 100 mg by mouth daily. 07/15/18  Yes [provider]   Rush Farmer, M.D. [facility] Pulmonary/Critical Care Medicine. Pager: [phone]. After hours pager: [phone].

## 2018-08-26 NOTE — Progress Notes (Signed)
CSW discussed disposition plans with pt's wife (pt unable to discuss currently)- explained that should pt return to SNF, if insurance approves, will have copay daily as he recently was at [facility] and 100% covered days are exhausted. Wife explains that PTA, pt was at [facility] and insurance had denied further coverage, pt was pursuing an appeal prior to him getting admitted to hospital.   Pt and wife unsure if copay could be managed financially- want to discuss tonight, wife also wanting medical update which CSW directed her to medical staff.  Will follow, unsure if pt will be able to admit to SNF given insurance coverage issues.  Sharren Bridge, MSW, LCSW Clinical Social Work 08/26/2018 [phone] coverage for [phone]

## 2018-08-26 NOTE — Care Management Important Message (Signed)
Important Message  Patient Details  Name: [name] MRN: [id number] Date of Birth: Dec 15, 1945   Medicare Important Message Given:  Yes    Mayleigh Tetrault 08/26/2018, 9:08 AM

## 2018-08-26 NOTE — Procedures (Signed)
Pre procedural Dx: Symptomatic Pleural effusion Post procedural Dx: Same  Successful US guided right sided thoracentesis yielding 1.4 L of serous, slightly blood tinged pleural fluid.   Samples sent to lab for analysis.  EBL: None  Complications: None immediate.  Ronny Bacon, MD Pager #: [phone]

## 2018-08-27 LAB — TRIGLYCERIDES, BODY FLUIDS: Triglycerides, Fluid: 14 mg/dL

## 2018-08-27 LAB — BASIC METABOLIC PANEL
Anion gap: 7 (ref 5–15)
BUN: 17 mg/dL (ref 8–23)
CO2: 29 mmol/L (ref 22–32)
Calcium: 8.9 mg/dL (ref 8.9–10.3)
Chloride: 105 mmol/L (ref 98–111)
Creatinine, Ser: 0.87 mg/dL (ref 0.61–1.24)
GFR calc Af Amer: >60 mL/min (ref >60)
GFR calc non Af Amer: >60 mL/min (ref >60)
Glucose, Bld: 100 mg/dL — ABNORMAL HIGH (ref 70–99)
Potassium: 4.1 mmol/L (ref 3.5–5.1)
Sodium: 141 mmol/L (ref 135–145)

## 2018-08-27 LAB — PH, BODY FLUID: pH, Body Fluid: 7.5

## 2018-08-27 MED ORDER — PROMETHAZINE HCL 25 MG/ML IJ SOLN
12.5000 mg | Freq: Four times a day (QID) | INTRAMUSCULAR | Status: DC | PRN
  Administered 2018-08-27: 12.5 mg via INTRAVENOUS
  Filled 2018-08-27: qty 1

## 2018-08-27 NOTE — Progress Notes (Signed)
Patient Demographics:    [name], is a 72 y.o. male, DOB - Apr 15, 1946, UTM:[id number]  Admit date - 08/17/2018   Admitting Physician No admitting provider for patient encounter.  Outpatient Primary MD for the patient is [name], MD  LOS - 10   Chief Complaint  Patient presents with  . Abnormal Lab        Subjective:   Reports nausea not improved with Zofran.  Had some back pain early this morning.  Still has cough.  No fever no chills.   Assessment  & Plan :    Principal Problem:   Atrial fibrillation with RVR (HCC) Active Problems:   Left hip postoperative wound infection   SIRS (systemic inflammatory response syndrome) (HCC)   Anemia of chronic disease   Depression   OSA (obstructive sleep apnea)   Obesity, Class III, BMI 40-49.9 (morbid obesity) (HCC)   Hypertensive urgency   Pleural effusion on left   Acute on chronic diastolic (congestive) heart failure (HCC)   OSA on CPAP  Brief Summary 72 y.o. male, with history of hypertension, A. 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 fib with RVR and also found to have right-sided pleural effusion, IR removed 830 mL of clear fluid, awaiting insurance approval for transfer back to SNF rehab   Plan:- 1) Chronic Atrial Fibrillation with RVR improved rate control, continue Cardizem CD 180 mg daily, and metoprolol  25 mg bid,  last known EF was 50 to 55% , continue Eliquis for anticoagulation rate control is occasionally difficult.  Monitor.  2)HFpEF with right-sided pleural effusion patient with history of chronic diastolic dysfunction CHF suspect exacerbation secondary to A. fib with RVR, improved post thoracentesis on 08/17/2018 with removal of 830  mL of clear fluid (fluid Gram stain and culture negative so far), fluid LDH 470 serum LDH 146, exudative effusion by Light's criteria,  PCCM appreciated, continue Lasix 40 mg twice daily, daily weights and strict I's and O's, supplemental oxygen as needed When repeat thoracentesis on 08/26/2018- is still concerning for exudative effusion.  Cytology negative.  Follow-up on cultures.  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----patient usually follows with infectious disease at [facility],  Completed IV ertapenem through 08/21/2018, ID physician at [facility] recommends Flagyl 500 twice daily for Bacteroides fragilis keep infection starting 08/22/2018 for 90 days, ESR is down to 60 from 107, CRP is down to 4.1 from 23.6  4)Morbid Obesity with OSA-- c/n CPAP nightly  5)DEpression----stable,  Seroquel and Remeron were held due to concerns about QT prolongation,  continue Lexapro   6)Chronic Anemia----etiology unclear, stool is Hemoccult positive at this time  , patient on Eliquis due to history of A. Fib , hemoglobin is currently 8.2, monitor H&H closely, continue Protonix Iron level still low.  Will increase oral iron supplementation as well as continue B12. B12 is relatively low.  Replaced with subcu injection x1.  7)Generalized Weakness/Debility--- PT eval appreciated, recommends SNF rehab awaiting insurance approval for SNF Rehab discharge ,  fatigue persist, difficulties with mobility related activities of daily living persist,,  dyspnea on exertion with minimal activity persist   Disposition/Need for in-Hospital Stay- patient unable to be discharged at this time due to awaiting insurance approval for SNF rehab transfer   Code Status : Full  Disposition Plan  : SNF Rehab  DVT Prophylaxis  : Eliquis  Lab Results  Component Value Date   PLT 329 08/26/2018    Inpatient Medications  Scheduled Meds: . apixaban  5 mg Oral BID  . cholecalciferol  2,000  Units Oral QPM  . cycloSPORINE  1 drop Both Eyes BID  . diltiazem  180 mg Oral Daily  . escitalopram  10 mg Oral Daily  . ferrous sulfate  325 mg Oral TID WC  . gabapentin  100 mg Oral TID  . Gerhardt's butt cream   Topical BID  . Melatonin  6 mg Oral QHS  . metoprolol tartrate  25 mg Oral BID  . metroNIDAZOLE  500 mg Oral BID  . multivitamin with minerals  1 tablet Oral QPM  . pantoprazole  40 mg Oral Daily  . rOPINIRole  0.25 mg Oral QHS  . senna-docusate  2 tablet Oral BID  . sodium chloride flush  10-40 mL Intracatheter Q12H  . thiamine  100 mg Oral Daily   Continuous Infusions:  PRN Meds:.acetaminophen **OR** acetaminophen, calcium carbonate, hydrOXYzine, ipratropium-albuterol, menthol-cetylpyridinium, ondansetron **OR** ondansetron (ZOFRAN) IV, oxyCODONE, phenol, polyethylene glycol, polyvinyl alcohol, promethazine, sodium chloride flush    Anti-infectives (From admission, onward)   Start     Dose/Rate Route Frequency Ordered Stop   08/22/18 1000  metroNIDAZOLE (FLAGYL) tablet 500 mg     500 mg Oral 2 times daily 08/21/18 1511 11/20/18 0959   08/21/18 1515  metroNIDAZOLE (FLAGYL) tablet 500 mg  Status:  Discontinued     500 mg Oral 2 times daily 08/21/18 1510 08/21/18 1511   08/17/18 1730  ertapenem (INVANZ) 1,000 mg in sodium chloride 0.9 % 100 mL IVPB  Status:  Discontinued     1 g 200 mL/hr over 30 Minutes Intravenous Every 24 hours 08/17/18 1720 08/17/18 1728   08/17/18 1200  ertapenem (INVANZ) 1,000 mg in sodium chloride 0.9 % 100 mL IVPB     1 g 200 mL/hr over 30 Minutes Intravenous Every 24 hours 08/17/18 1036 08/21/18 1308        Objective:   Vitals:   08/26/18 2134 08/26/18 2222 08/27/18 0431 08/27/18 1314  BP:  106/71 122/78 112/76  Pulse:  90 82 84  Resp: _0 Temp:  98.9 F (37.2 C) 98.1 F (36.7 C) 98.6 F (37 C)  TempSrc:  Oral Oral Oral  SpO2:  92% 93% 94%  Weight:      Height:        Wt Readings from Last 3 Encounters:  08/26/18  (!) 144.7 kg  04/24/18 (!) 163.6 kg  03/17/18 (!) 155.6 kg     Intake/Output Summary (Last 24 hours) at 08/27/2018 1829 Last data filed at 08/27/2018 1712 Gross per 24 hour  Intake -  Output 1347 ml  Net -1347 ml     Physical Exam Gen:- Awake Alert, obese, in no acute distress HEENT:- [place].AT, No sclera icterus Neck-Supple Neck,No JVD,.  Lungs-improved air movement , no wheezing or rales CV- S1, S2 normal, irregularly irregular  abd-  +ve B.Sounds, Abd Soft, No tenderness, increased truncal adiposity    Extremity/Skin:-  pedal pulses present  Psych-affect is appropriate, oriented x3 Neuro-generalized weakness but no new focal deficits, no tremors MSK-left hip  wound appears to have healed nicely  Data Review:   Micro Results Recent Results (from the past 240 hour(s))  MRSA PCR Screening     Status: None   Collection Time: 08/17/18  8:11 PM  Result Value Ref Range Status   MRSA by PCR NEGATIVE NEGATIVE Final    Comment:        The GeneXpert MRSA Assay (FDA approved for NASAL specimens only), is one component of a comprehensive MRSA colonization surveillance program. It is not intended to diagnose MRSA infection nor to guide or monitor treatment for MRSA infections. Performed at [facility], [place] [address], [place], [place] [address]   Culture, body fluid-bottle     Status: None (Preliminary result)   Collection Time: 08/26/18 12:52 PM  Result Value Ref Range Status   Specimen Description FLUID PLEURAL RIGHT  Final   Special Requests BOTTLES DRAWN AEROBIC AND ANAEROBIC  Final   Culture   Final    NO GROWTH < 24 HOURS Performed at [facility], [place] [address], [place], [place] [address]    Report Status PENDING  Incomplete  Gram stain     Status: None   Collection Time: 08/26/18 12:52 PM  Result Value Ref Range Status   Specimen Description FLUID PLEURAL RIGHT  Final   Special Requests NONE  Final   Gram Stain   Final    CYTOSPIN  SMEAR WBC PRESENT,BOTH PMN AND MONONUCLEAR NO ORGANISMS SEEN Performed at [facility], [address] [place], [place] [address]    Report Status 08/26/2018 FINAL  Final    Radiology Reports Dg Chest 1 View  Result Date: 08/26/2018 CLINICAL DATA:  Status post right thoracentesis today. EXAM: CHEST  1 VIEW COMPARISON:  Single-view of the chest 12/22 scratch the single view of the chest earlier today. FINDINGS: Right pleural effusion is markedly decreased after thoracentesis. No pneumothorax. Left effusion and airspace disease persist. There is cardiomegaly and vascular congestion. IMPRESSION: Marked decrease in right pleural effusion after thoracentesis. Negative for pneumothorax. No change in a left pleural effusion and airspace disease. Cardiomegaly and vascular congestion. Electronically Signed   By: Inge Rise M.D.   On: 08/26/2018 13:01   Dg Chest 1 View  Result Date: 08/17/2018 CLINICAL DATA:  Right-sided thoracentesis EXAM: CHEST  1 VIEW COMPARISON:  Portable chest x-ray of 08/17/2018 FINDINGS: A right thoracentesis has been performed. No pneumothorax is seen. Mild volume loss at the left lung base remains with small left effusion. Cardiomegaly is stable. Left PICC line tip is seen to the mid lower SVC. IMPRESSION: 1. No pneumothorax after right thoracentesis. 2. Little change in opacity at the left lung base consistent with atelectasis and left effusion. Electronically Signed   By: Ivar Drape M.D.   On: 08/17/2018 16:23   Ct Angio Chest Pe W And/or Wo Contrast  Result Date: 08/17/2018 CLINICAL DATA:  Low hemoglobin with pneumonia. Dyspnea and fever. EXAM: CT ANGIOGRAPHY CHEST WITH CONTRAST TECHNIQUE: Multidetector CT imaging of the chest was performed using the standard protocol during bolus administration of intravenous contrast. Multiplanar CT image reconstructions and MIPs were obtained to evaluate the vascular anatomy. CONTRAST:  140m ISOVUE-370 IOPAMIDOL (ISOVUE-370)  INJECTION 76% COMPARISON:  08/17/2018 CXR, chest CT 06/17/2018 FINDINGS: Cardiovascular: Conventional branch pattern of the great vessels with minimal atherosclerosis at the origin of the left subclavian. No aortic aneurysm or dissection. Mild aortic atherosclerosis without aneurysm or dissection. Scattered coronary arteriosclerosis. Satisfactory opacification of the pulmonary arteries  to the proximal segmental level without pulmonary embolus. Heart size is mildly enlarged without pericardial effusion or thickening. Mediastinum/Nodes: Patent trachea and mainstem bronchi. No thyroid mass. The CT appearance of the esophagus is unremarkable. No axillary, supraclavicular, mediastinal nor hilar adenopathy. Lungs/Pleura: New moderate to large right pleural effusion with trace to small left pleural effusion. Adjacent bibasilar and compressive atelectasis is noted. Atelectasis and/or scarring is also seen in the lingula. Respiratory motion artifact slightly limit assessment of the lung bases. No pneumothorax. No dominant mass. There appear to be scattered calcified pleural plaque along the periphery of the left hemithorax. Upper Abdomen: Cholecystectomy. 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 Increased fecal retention within the included colon. Musculoskeletal: Spondylosis of the dorsal spine. No acute nor aggressive osseous lesions. Review of the MIP images confirms the above findings. IMPRESSION: 1. No acute pulmonary embolus, aortic aneurysm or dissection. 2. New moderate to large right pleural effusion with trace to small left pleural effusion. There is adjacent compressive and subsegmental atelectasis at each lung base and lingula. Aortic Atherosclerosis (ICD10-I70.0). Electronically Signed   By: David  Kwon M.D.   On: 08/17/2018 14:34   Dg Chest Port 1 View  Result Date: 08/26/2018 CLINICAL DATA:  73-year-old male status post ultrasound-guided right side thoracentesis on 08/17/2018. EXAM:  PORTABLE CHEST 1 VIEW COMPARISON:  08/25/2018 and earlier. FINDINGS: Portable AP semi upright view at 0451 hours. Stable left PICC line. Stable lung volumes and mediastinal contours. Increased veiling opacity at the right lung base since yesterday partially obscuring the right diaphragm. Persistent small left pleural effusion suspected. No pneumothorax. Stable pulmonary vascularity without acute edema. IMPRESSION: 1. Suspect re-accumulating right pleural effusion. Stable small left pleural effusion. 2. No new cardiopulmonary abnormality. Electronically Signed   By: H  Hall M.D.   On: 08/26/2018 07:54   Dg Chest Port 1 View  Result Date: 08/25/2018 CLINICAL DATA:  Pleural effusion. EXAM: PORTABLE CHEST 1 VIEW COMPARISON:  08/17/2018 FINDINGS: 0517 hours. The cardio pericardial silhouette is enlarged. There is pulmonary vascular congestion without overt pulmonary edema. Interval progression of airspace disease at the right base with persistent left base collapse/consolidation. Small bilateral pleural effusions are evident. Telemetry leads overlie the chest. IMPRESSION: Cardiomegaly with bibasilar collapse/consolidation, progressive on the right. Small bilateral pleural effusions. Electronically Signed   By: Eric  Mansell M.D.   On: 08/25/2018 07:54   Dg Chest Portable 1 View  Result Date: 08/17/2018 CLINICAL DATA:  Right-sided chest pain EXAM: PORTABLE CHEST 1 VIEW COMPARISON:  CT chest 06/17/2018 FINDINGS: Small left pleural effusion. Bilateral mild interstitial thickening no focal consolidation or pneumothorax. Stable cardiomegaly. Left-sided PICC line with the tip projecting over the SVC. Moderate osteoarthritis of the right shoulder. IMPRESSION: 1. Cardiomegaly with mild pulmonary vascular congestion. Electronically Signed   By: Hetal  Patel   On: 08/17/2018 11:44   Us Thoracentesis Asp Pleural Space W/img Guide  Result Date: 08/26/2018 INDICATION: History of pneumonia with recurrent symptomatic  right-sided pleural effusion. Please perform ultrasound-guided thoracentesis for diagnostic and therapeutic purposes. EXAM: US THORACENTESIS ASP PLEURAL SPACE W/IMG GUIDE COMPARISON:  Right-sided ultrasound-guided thoracentesis-08/17/2018 yielding 830 cc of AC, amber colored fluid. Chest radiograph-earlier same day; 08/17/2018; chest CT-08/17/2018 MEDICATIONS: None. COMPLICATIONS: None immediate. TECHNIQUE: Informed written consent was obtained from the patient after a discussion of the risks, benefits and alternatives to treatment. A timeout was performed prior to the initiation of the procedure. Patient was unable to sit upright on the edge of his hospital bed and as such was positioned left   lateral decubitus. Initial ultrasound scanning demonstrates a recurrent moderate sized anechoic right-sided pleural effusion. The lower chest was prepped and draped in the usual sterile fashion. 1% lidocaine was used for local anesthesia. Under direct ultrasound guidance, a 19 gauge, 7-cm, Yueh catheter was introduced. An ultrasound image was saved for documentation purposes. The thoracentesis was performed. The catheter was removed and a dressing was applied. The patient tolerated the procedure well without immediate post procedural complication. The patient was escorted to have an upright chest radiograph. FINDINGS: A total of approximately 1.4 liters of serous, slightly blood tinged fluid was removed. Requested samples were sent to the laboratory. IMPRESSION: Successful ultrasound-guided right sided thoracentesis yielding 1.4 liters of serous, slightly blood tinged pleural fluid. Electronically Signed   By: John  Watts M.D.   On: 08/26/2018 13:28   Us Thoracentesis Asp Pleural Space W/img Guide  Result Date: 08/17/2018 INDICATION: Patient with history of pneumonia, chronic left hip infection, bilateral pleural effusions right greater than left. Request made for diagnostic and therapeutic right thoracentesis. EXAM:  ULTRASOUND GUIDED DIAGNOSTIC AND THERAPEUTIC RIGHT THORACENTESIS MEDICATIONS: None COMPLICATIONS: None immediate. PROCEDURE: An ultrasound guided thoracentesis was thoroughly discussed with the patient and questions answered. The benefits, risks, alternatives and complications were also discussed. The patient understands and wishes to proceed with the procedure. Written consent was obtained. Ultrasound was performed to localize and mark an adequate pocket of fluid in the right chest. The area was then prepped and draped in the normal sterile fashion. 1% Lidocaine was used for local anesthesia. Under ultrasound guidance a 6 Fr Safe-T-Centesis catheter was introduced. Thoracentesis was performed. The catheter was removed and a dressing applied. FINDINGS: A total of approximately 830 cc of hazy, amber fluid was removed. Samples were sent to the laboratory as requested by the clinical team. IMPRESSION: Successful ultrasound guided diagnostic and therapeutic right thoracentesis yielding 830 cc of pleural fluid. Read by: Kevin Allred, PA-C Electronically Signed   By: Heath  McCullough M.D.   On: 08/17/2018 16:18     CBC Recent Labs  Lab 08/21/18 0433 08/22/18 0403 08/25/18 0341 08/26/18 0449  WBC 6.5 6.3 6.7 6.1  HGB 8.2* 8.1* 8.1* 8.4*  HCT 28.4* 28.5* 28.3* 29.1*  PLT 246 265 279 329  MCV 92.2 93.8 93.4 90.9  MCH 26.6 26.6 26.7 26.3  MCHC 28.9* 28.4* 28.6* 28.9*  RDW 16.1* 16.4* 16.3* 16.1*  LYMPHSABS  --   --   --  1.5  MONOABS  --   --   --  0.6  EOSABS  --   --   --  0.4  BASOSABS  --   --   --  0.0    Chemistries  Recent Labs  Lab 08/22/18 0403 08/25/18 0341 08/26/18 0449 08/27/18 0921  NA 143 143 142 141  K 4.1 4.3 4.2 4.1  CL 108 108 106 105  CO2 28 27 29 29  GLUCOSE 102* 100* 99 100*  BUN 15 18 18 17  CREATININE 0.84 0.89 0.91 0.87  CALCIUM 8.6* 8.6* 8.9 8.9    ------------------------------------------------------------------------------------------------------------------    Component Value Date/Time   BNP 373.9 (H) 08/17/2018 1106    Pranav Patel M.D on 08/27/2018 at 6:29 PM  Go to [email or web address] - password [email or web address] for contact info  [facility] - Office  [phone]        

## 2018-08-27 NOTE — Progress Notes (Signed)
NAME:  [name], MRN:  [id number], DOB:  1945-11-09, LOS: 64 ADMISSION DATE:  08/17/2018, CONSULTATION DATE:  08/24/2018 REFERRING MD:  Hermelinda Dellen, CHIEF COMPLAINT:  Pleural effusion   Brief History   72 year old male with giant cell arteritis now off prednisone who just had a very complicated course in [facility] to include hip fracture after a fall, intubation and delirium, please see H&P for extensive details.  Patient was brought to [facility] for CP and SOB and was noted to have R>L pleural effusion and the right was tapped.  Pleural fluid analysis was inconsistent and PCCM was called on consultation.  Patient denies any previous history of pleural effusion or thoracentesis in the past but did have new onset rapid a-fib during this admission.    History of present illness   72 year old male with giant cell arteritis now off prednisone who just had a very complicated course in [place] to include hip fracture after a fall, intubation and delirium, please see H&P for extensive details.  Patient was brought to [facility] for CP and SOB and was noted to have R>L pleural effusion and the right was tapped.  Pleural fluid analysis was inconsistent and PCCM was called on consultation.  Patient denies any previous history of pleural effusion or thoracentesis in the past but did have new onset rapid a-fib during this admission.    Past Medical History  See below  Significant Hospital Events   Thora  Consults:  PCCM  Procedures:  Inocencio Homes 12/17  Significant Diagnostic Tests:  Pleural effusion analysis with protein <3, LDH 473, WBC of 2815 with 40% PMNs and 26% mono, cytology is negative for malignancy  Micro Data:  Pleural 12/17 negative Blood 12/17 negative  Antimicrobials:  Flagyl chronically form [facility]   Interim history/subjective:  No events overnight Thora done yesterday  Objective   Blood pressure 122/78, pulse 82, temperature 98.1 F (36.7 C), temperature source Oral, resp. rate 20,  height 5\' 8"  (1.727 m), weight (!) 144.7 kg, SpO2 93 %.        Intake/Output Summary (Last 24 hours) at 08/27/2018 0853 Last data filed at 08/27/2018 5465 Gross per 24 hour  Intake 370 ml  Output 1195 ml  Net -825 ml   Filed Weights   08/19/18 0520 08/22/18 0516 08/26/18 0432  Weight: (!) 139.5 kg 111.6 kg (!) 144.7 kg   Examination: General: Chronically ill appearing male, NAD HENT: [place]/AT, PERRL, EOM-I and MMM Lungs: Decreased BS diffusely Cardiovascular: RRR, Nl S1/S2 and -M/R/G Abdomen: Soft, NT, ND and +BS Extremities: Fracture repair, -edema and -tenderness Neuro: Alert and interactive, moving all ext to command Skin: intact  I reviewed CXR myself, right pleural effusion removed  Resolved Hospital Problem list     Assessment & Plan:  72 year old male with exudative pleural effusion by LDH criteria only with cell analysis that is inconsistent with a bacterial infection and negative cytology.  Discussed with PCCM-NP.  Pleural effusion:  - Fluid analysis consistent with exudate again by LDH criteria but this time the fluid is bloody with mostly lymphs which is more consistent with a malignancy.  Will need cytology (sent).  - F/U on cytology  - Auto-immune panel with CRP of 4.1 and negative ANA, P-ANCA, MPO, C-ANCA, so inconsistent with any autoimmune disease that can affect the pleural space.  - Fluid analysis discussed with TRH-MD  Hypoxemia:  - D/C O2  Pulmonary edema:  - Diureses to PRN at this point  PCCM  will continue to follow  Discussed with TRH-MD  Labs   CBC: Recent Labs  Lab 08/21/18 0433 08/22/18 0403 08/25/18 0341 08/26/18 0449  WBC 6.5 6.3 6.7 6.1  NEUTROABS  --   --   --  3.5  HGB 8.2* 8.1* 8.1* 8.4*  HCT 28.4* 28.5* 28.3* 29.1*  MCV 92.2 93.8 93.4 90.9  PLT 246 265 279 937    Basic Metabolic Panel: Recent Labs  Lab 08/22/18 0403 08/25/18 0341 08/26/18 0449  NA 143 143 142  K 4.1 4.3 4.2  CL 108 108 106  CO2 28 27 29   GLUCOSE  102* 100* 99  BUN 15 18 18   CREATININE 0.84 0.89 0.91  CALCIUM 8.6* 8.6* 8.9   GFR: Estimated Creatinine Clearance: 102.6 mL/min (by C-G formula based on SCr of 0.91 mg/dL). Recent Labs  Lab 08/21/18 0433 08/22/18 0403 08/25/18 0341 08/26/18 0449  WBC 6.5 6.3 6.7 6.1    Liver Function Tests: No results for input(s): AST, ALT, ALKPHOS, BILITOT, PROT, ALBUMIN in the last 168 hours. No results for input(s): LIPASE, AMYLASE in the last 168 hours. No results for input(s): AMMONIA in the last 168 hours.  ABG No results found for: PHART, PCO2ART, PO2ART, HCO3, TCO2, ACIDBASEDEF, O2SAT   Coagulation Profile: Recent Labs  Lab 08/26/18 0449  INR 1.46    Cardiac Enzymes: No results for input(s): CKTOTAL, CKMB, CKMBINDEX, TROPONINI in the last 168 hours.  HbA1C: No results found for: HGBA1C  CBG: No results for input(s): GLUCAP in the last 168 hours.  Rush Farmer, M.D. [facility] Pulmonary/Critical Care Medicine. Pager: [phone]. After hours pager: [phone].

## 2018-08-27 NOTE — Progress Notes (Signed)
Physical Therapy Treatment Patient Details Name: [name] MRN: [id number] DOB: 17-Jul-1946 Today's Date: 08/27/2018    History of Present Illness 72 y.o. male, with history of hypertension, A. fib on Eliquis, giant cell arteritis, OSA on CPAP, left total hip arthroplasty, L hip infection, anxiety, depression, morbid obesity; recent prolonged hospital stay at [facility] with  altered mental status, respiratory distress requiring intubation, was then transferred to [facility],  extubated at [facility] where he had further prolonged stay with delirium, had a fall from bed injuring his left hip with x-ray showing new linear linear peri-prosthetic fracture; admitted to [facility] on 08/17/18 with fatigue and cough, was found to be in afib in ED; underwent thoracentesis 12/17      PT Comments    Assisted out of bed required + 2 and increased time.  Pt c/o increased back pain today "from that bed".  Pt very uncomfortable in air bed and c/o he can't move or reposition self "I'm sunk in a hole".  MD wrote order for a regular mattress Bari bed with an over head trapeze (thank you) as that is what he is use to at home.  So assisted out of Air bed to recliner while beds where were switched out.  Pt delined to stay in recliner "they leave me here too long" so assisted back to regular mattress Bari bed with Trapeze.   Pt only amb 2 feet today due to increased c/o dyspnea and noted anxiety (not sure why).  Pt plans to D/C to SNF.     Follow Up Recommendations  SNF     Equipment Recommendations  None recommended by PT    Recommendations for Other Services       Precautions / Restrictions Precautions Precautions: Fall Restrictions Weight Bearing Restrictions: No Other Position/Activity Restrictions: per pt he is allowed WBAT; periprosthetic was > 2 mos ago per notes, pt was amb at [place] bed mobility: Needs Assistance Bed Mobility: Supine to Sit      Supine to sit: HOB elevated;Mod assist Sit to supine: Max assist;+2 for physical assistance;+2 for safety/equipment   General bed mobility comments: assist with upper body due to ABD girth and great difficulty self performing due to air mattress  Transfers Overall transfer level: Needs assistance Equipment used: Rolling walker (2 wheeled) Transfers: Sit to/from [facility] Sit to Stand: Min assist;+2 safety/equipment;From elevated surface Stand pivot transfers: Min assist;+2 physical assistance;+2 safety/equipment       General transfer comment: pt tends to use forward momentum and required increased assist from lower level recliner vs elevated bed.    Ambulation/Gait Ambulation/Gait assistance: Min guard;+2 safety/equipment Gait Distance (Feet): 2 Feet Assistive device: Rolling walker (2 wheeled) Gait Pattern/deviations: Step-to pattern;Wide base of support Gait velocity: decreased    General Gait Details: limited amb distance due max c/o fatigue and 3/4 dyspnea.  Required an extended seated rest break before attempting a transfer back to bed.     Stairs             Wheelchair Mobility    Modified Rankin (Stroke Patients Only)       Balance                                            Cognition Arousal/Alertness: Awake/alert Behavior During Therapy: WFL for tasks assessed/performed  Overall Cognitive Status: Within Functional Limits for tasks assessed                                        Exercises      General Comments        Pertinent Vitals/Pain Pain Assessment: Faces Faces Pain Scale: Hurts even more Pain Location: back Pain Descriptors / Indicators: Discomfort;Constant;Grimacing Pain Intervention(s): Monitored during session;Repositioned    Home Living                      Prior Function            PT Goals (current goals can now be found in the care plan section) Progress towards  PT goals: Progressing toward goals    Frequency    Min 2X/week      PT Plan Current plan remains appropriate    Co-evaluation              AM-PAC PT "6 Clicks" Mobility   Outcome Measure  Help needed turning from your back to your side while in a flat bed without using bedrails?: A Lot Help needed moving from lying on your back to sitting on the side of a flat bed without using bedrails?: A Lot Help needed moving to and from a bed to a chair (including a wheelchair)?: A Lot Help needed standing up from a chair using your arms (e.g., wheelchair or bedside chair)?: A Lot Help needed to walk in hospital room?: A Lot Help needed climbing 3-5 steps with a railing? : Total 6 Click Score: 11    End of Session Equipment Utilized During Treatment: Gait belt Activity Tolerance: Other (comment)(dyspnea) Patient left: with call bell/phone within reach;in bed Nurse Communication: Mobility status PT Visit Diagnosis: Other abnormalities of gait and mobility (R26.89);Muscle weakness (generalized) (M62.81)     Time: 1020-1050 PT Time Calculation (min) (ACUTE ONLY): 30 min  Charges:  $Gait Training: 8-22 mins $Therapeutic Activity: 8-22 mins                     Rica Koyanagi  PTA Acute  Rehabilitation Services Pager      [phone] Office      [phone]

## 2018-08-28 NOTE — Progress Notes (Signed)
Patient Demographics:    [name], is a 72 y.o. male, DOB - 1946/08/24, HER:[id number]  Admit date - 08/17/2018   Admitting Physician No admitting provider for patient encounter.  Outpatient Primary MD for the patient is [name], MD  LOS - 11   Chief Complaint  Patient presents with  . Abnormal Lab        Subjective:   Patient was interviewed and examined along with his RN in room.  Reports that he had a rough day yesterday with some nausea and nonbloody emesis.  Was able to eat small amount of dinner last night and no vomiting since.  Was out of bed to chair for a short time.  Some back pain.  No other complaints noted.   Assessment  & Plan :    Principal Problem:   Atrial fibrillation with RVR (HCC) Active Problems:   Left hip postoperative wound infection   SIRS (systemic inflammatory response syndrome) (HCC)   Anemia of chronic disease   Depression   OSA (obstructive sleep apnea)   Obesity, Class III, BMI 40-49.9 (morbid obesity) (HCC)   Hypertensive urgency   Pleural effusion on left   Acute on chronic diastolic (congestive) heart failure (HCC)   OSA on CPAP  Brief Summary 72 y.o. male, with history of hypertension, A. 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 fib with RVR and also found to have right-sided pleural effusion, IR removed 830 mL of clear fluid, awaiting insurance approval for transfer back to SNF rehab   Assessment and plan:  1) Chronic Atrial Fibrillation with RVR improved rate control, continue Cardizem CD 180 mg daily, and metoprolol  25 mg bid,  last known EF was 50 to 55% , continue Eliquis for anticoagulation rate control is occasionally difficult.  Mostly controlled  ventricular rate.  Mild RVR this morning in the 110s-120s.  2)HFpEF with right-sided pleural effusion patient with history of chronic diastolic dysfunction CHF suspect exacerbation secondary to A. fib with RVR, Right pleural effusion improved post thoracentesis on 08/17/2018 with removal of 830 mL of clear fluid (fluid Gram stain and culture negative, final report), fluid LDH 470 serum LDH 146, exudative effusion by Light's criteria,  PCCM appreciated, continue Lasix 40 mg twice daily, daily weights and strict I's and O's, supplemental oxygen as needed Repeat thoracentesis (1.4 L by IR) on 08/26/2018- is still concerning for exudative effusion.  Cytology negative.  Right pleural fluid culture 12/26: Negative to date.  As per pulmonology follow-up 12/27, fluid analysis consistent with exudative effusion but fluid was bloody however cytology negative.  Autoimmune work-up negative.  Clinically appears euvolemic.  Improved.  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----patient usually follows with infectious disease at [facility],  Completed IV ertapenem through 08/21/2018, ID physician at [facility] recommends Flagyl 500 twice daily for Bacteroides fragilis keep infection starting 08/22/2018 for 90 days, ESR is down to 60 from 107, CRP is down to 4.1 from 23.6  4)Morbid Obesity with OSA-- c/n CPAP nightly  5)Depression----stable,  Seroquel and Remeron were held due to concerns about  QT prolongation,  continue Lexapro   6)Chronic Anemia----etiology unclear, stool is Hemoccult positive at this time  , patient on Eliquis due to history of A. Fib , hemoglobin is currently 8.2, monitor H&H closely, continue Protonix Iron level still low.  Increased oral iron supplementation as well as continue B12. B12 is relatively low.  Replaced with subcu injection x1.  Hemoglobin stable in the 8 g range for several days.  Follow CBCs periodically.  7)Generalized Weakness/Debility--- PT  eval appreciated, recommends SNF rehab awaiting insurance approval for SNF Rehab discharge ,  fatigue persist, difficulties with mobility related activities of daily living persist,, dyspnea on exertion with minimal activity persist  Nausea and vomiting: Unclear etiology.  No acute abdominal findings.  Suspect self-limiting.  Diet as tolerated and monitor closely.   Disposition/Need for in-Hospital Stay- patient unable to be discharged at this time due to awaiting insurance approval for SNF rehab transfer.  Awaiting clinical social work follow-up.  Code Status : Full  Disposition Plan  : SNF Rehab  DVT Prophylaxis  : Eliquis  Inpatient Medications  Scheduled Meds: . apixaban  5 mg Oral BID  . cholecalciferol  2,000 Units Oral QPM  . cycloSPORINE  1 drop Both Eyes BID  . diltiazem  180 mg Oral Daily  . escitalopram  10 mg Oral Daily  . ferrous sulfate  325 mg Oral TID WC  . gabapentin  100 mg Oral TID  . Gerhardt's butt cream   Topical BID  . Melatonin  6 mg Oral QHS  . metoprolol tartrate  25 mg Oral BID  . metroNIDAZOLE  500 mg Oral BID  . multivitamin with minerals  1 tablet Oral QPM  . pantoprazole  40 mg Oral Daily  . rOPINIRole  0.25 mg Oral QHS  . senna-docusate  2 tablet Oral BID  . sodium chloride flush  10-40 mL Intracatheter Q12H  . thiamine  100 mg Oral Daily   Continuous Infusions:  PRN Meds:.acetaminophen **OR** acetaminophen, calcium carbonate, hydrOXYzine, ipratropium-albuterol, menthol-cetylpyridinium, ondansetron **OR** ondansetron (ZOFRAN) IV, oxyCODONE, phenol, polyethylene glycol, polyvinyl alcohol, promethazine, sodium chloride flush    Anti-infectives (From admission, onward)   Start     Dose/Rate Route Frequency Ordered Stop   08/22/18 1000  metroNIDAZOLE (FLAGYL) tablet 500 mg     500 mg Oral 2 times daily 08/21/18 1511 11/20/18 0959   08/21/18 1515  metroNIDAZOLE (FLAGYL) tablet 500 mg  Status:  Discontinued     500 mg Oral 2 times daily 08/21/18  1510 08/21/18 1511   08/17/18 1730  ertapenem (INVANZ) 1,000 mg in sodium chloride 0.9 % 100 mL IVPB  Status:  Discontinued     1 g 200 mL/hr over 30 Minutes Intravenous Every 24 hours 08/17/18 1720 08/17/18 1728   08/17/18 1200  ertapenem (INVANZ) 1,000 mg in sodium chloride 0.9 % 100 mL IVPB     1 g 200 mL/hr over 30 Minutes Intravenous Every 24 hours 08/17/18 1036 08/21/18 1308        Objective:   Vitals:   08/27/18 0431 08/27/18 1314 08/27/18 2036 08/28/18 0456  BP: 122/78 112/76 103/65 109/80  Pulse: 82 84 98 85  Resp: _0 Temp: 98.1 F (36.7 C) 98.6 F (37 C) 99.9 F (37.7 C) 98.3 F (36.8 C)  TempSrc: Oral Oral Oral Oral  SpO2: 93% 94% 93% 96%  Weight:      Height:        Wt Readings from Last 3 Encounters:  08/26/18 (!) 144.7 kg  04/24/18 (!) 163.6 kg  03/17/18 (!) 155.6 kg     Intake/Output Summary (Last 24 hours) at 08/28/2018 1508 Last data filed at 08/28/2018 1100 Gross per 24 hour  Intake 665 ml  Output 676 ml  Net -11 ml     Physical Exam Gen:-Pleasant elderly male, moderately built and nourished lying comfortably propped up in bed. Lungs-diminished breath sounds bibasilarly with bronchial breath sounds left base with occasional crackles.  Otherwise clear to auscultation. CV-S1 and S2 heard, irregularly irregular.  No JVD.  Trace bilateral ankle edema.  Murmurs.  Telemetry personally reviewed: A. fib with mostly controlled ventricular rate.  Occasional RVR in the 110s-120s. abd-nondistended, soft and nontender.  No organomegaly or masses appreciated.  Normal bowel sounds heard. Extremity/Skin:-  pedal pulses present.  Chronic patchy bilateral leg discoloration/hyperpigmentation. Psych-affect is flat. Neuro-alert and oriented x3.  No focal neurological deficits. MSK-left hip wound appears to have healed nicely  Data Review:   Micro Results Recent Results (from the past 240 hour(s))  Culture, body fluid-bottle     Status: None (Preliminary  result)   Collection Time: 08/26/18 12:52 PM  Result Value Ref Range Status   Specimen Description FLUID PLEURAL RIGHT  Final   Special Requests BOTTLES DRAWN AEROBIC AND ANAEROBIC  Final   Culture   Final    NO GROWTH 2 DAYS Performed at [facility], [address], [place], [place] [address]    Report Status PENDING  Incomplete  Gram stain     Status: None   Collection Time: 08/26/18 12:52 PM  Result Value Ref Range Status   Specimen Description FLUID PLEURAL RIGHT  Final   Special Requests NONE  Final   Gram Stain   Final    CYTOSPIN SMEAR WBC PRESENT,BOTH PMN AND MONONUCLEAR NO ORGANISMS SEEN Performed at [facility], [address], [place], [place] [address]    Report Status 08/26/2018 FINAL  Final    Radiology Reports Dg Chest 1 View  Result Date: 08/26/2018 CLINICAL DATA:  Status post right thoracentesis today. EXAM: CHEST  1 VIEW COMPARISON:  Single-view of the chest 12/22 scratch the single view of the chest earlier today. FINDINGS: Right pleural effusion is markedly decreased after thoracentesis. No pneumothorax. Left effusion and airspace disease persist. There is cardiomegaly and vascular congestion. IMPRESSION: Marked decrease in right pleural effusion after thoracentesis. Negative for pneumothorax. No change in a left pleural effusion and airspace disease. Cardiomegaly and vascular congestion. Electronically Signed   By: Inge Rise M.D.   On: 08/26/2018 13:01   Dg Chest 1 View  Result Date: 08/17/2018 CLINICAL DATA:  Right-sided thoracentesis EXAM: CHEST  1 VIEW COMPARISON:  Portable chest x-ray of 08/17/2018 FINDINGS: A right thoracentesis has been performed. No pneumothorax is seen. Mild volume loss at the left lung base remains with small left effusion. Cardiomegaly is stable. Left PICC line tip is seen to the mid lower SVC. IMPRESSION: 1. No pneumothorax after right thoracentesis. 2. Little change in opacity at the left lung base consistent with  atelectasis and left effusion. Electronically Signed   By: Ivar Drape M.D.   On: 08/17/2018 16:23   Ct Angio Chest Pe W And/or Wo Contrast  Result Date: 08/17/2018 CLINICAL DATA:  Low hemoglobin with pneumonia. Dyspnea and fever. EXAM: CT ANGIOGRAPHY CHEST WITH CONTRAST TECHNIQUE: Multidetector CT imaging of the chest was performed using the standard protocol during bolus administration of intravenous contrast. Multiplanar CT image reconstructions and  MIPs were obtained to evaluate the vascular anatomy. CONTRAST:  119m ISOVUE-370 IOPAMIDOL (ISOVUE-370) INJECTION 76% COMPARISON:  08/17/2018 CXR, chest CT 06/17/2018 FINDINGS: Cardiovascular: Conventional branch pattern of the great vessels with minimal atherosclerosis at the origin of the left subclavian. No aortic aneurysm or dissection. Mild aortic atherosclerosis without aneurysm or dissection. Scattered coronary arteriosclerosis. Satisfactory opacification of the pulmonary arteries to the proximal segmental level without pulmonary embolus. Heart size is mildly enlarged without pericardial effusion or thickening. Mediastinum/Nodes: Patent trachea and mainstem bronchi. No thyroid mass. The CT appearance of the esophagus is unremarkable. No axillary, supraclavicular, mediastinal nor hilar adenopathy. Lungs/Pleura: New moderate to large right pleural effusion with trace to small left pleural effusion. Adjacent bibasilar and compressive atelectasis is noted. Atelectasis and/or scarring is also seen in the lingula. Respiratory motion artifact slightly limit assessment of the lung bases. No pneumothorax. No dominant mass. There appear to be scattered calcified pleural plaque along the periphery of the left hemithorax. Upper Abdomen: Cholecystectomy. 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 Increased fecal retention within the included colon. Musculoskeletal: Spondylosis of the dorsal spine. No acute nor aggressive osseous lesions. Review of the  MIP images confirms the above findings. IMPRESSION: 1. No acute pulmonary embolus, aortic aneurysm or dissection. 2. New moderate to large right pleural effusion with trace to small left pleural effusion. There is adjacent compressive and subsegmental atelectasis at each lung base and lingula. Aortic Atherosclerosis (ICD10-I70.0). Electronically Signed   By: DAshley RoyaltyM.D.   On: 08/17/2018 14:34   Dg Chest Port 1 View  Result Date: 08/26/2018 CLINICAL DATA:  72year old male status post ultrasound-guided right side thoracentesis on 08/17/2018. EXAM: PORTABLE CHEST 1 VIEW COMPARISON:  08/25/2018 and earlier. FINDINGS: Portable AP semi upright view at 0451 hours. Stable left PICC line. Stable lung volumes and mediastinal contours. Increased veiling opacity at the right lung base since yesterday partially obscuring the right diaphragm. Persistent small left pleural effusion suspected. No pneumothorax. Stable pulmonary vascularity without acute edema. IMPRESSION: 1. Suspect re-accumulating right pleural effusion. Stable small left pleural effusion. 2. No new cardiopulmonary abnormality. Electronically Signed   By: HGenevie AnnM.D.   On: 08/26/2018 07:54   Dg Chest Port 1 View  Result Date: 08/25/2018 CLINICAL DATA:  Pleural effusion. EXAM: PORTABLE CHEST 1 VIEW COMPARISON:  08/17/2018 FINDINGS: 0517 hours. The cardio pericardial silhouette is enlarged. There is pulmonary vascular congestion without overt pulmonary edema. Interval progression of airspace disease at the right base with persistent left base collapse/consolidation. Small bilateral pleural effusions are evident. Telemetry leads overlie the chest. IMPRESSION: Cardiomegaly with bibasilar collapse/consolidation, progressive on the right. Small bilateral pleural effusions. Electronically Signed   By: EMisty StanleyM.D.   On: 08/25/2018 07:54   Dg Chest Portable 1 View  Result Date: 08/17/2018 CLINICAL DATA:  Right-sided chest pain EXAM: PORTABLE  CHEST 1 VIEW COMPARISON:  CT chest 06/17/2018 FINDINGS: Small left pleural effusion. Bilateral mild interstitial thickening no focal consolidation or pneumothorax. Stable cardiomegaly. Left-sided PICC line with the tip projecting over the SVC. Moderate osteoarthritis of the right shoulder. IMPRESSION: 1. Cardiomegaly with mild pulmonary vascular congestion. Electronically Signed   By: HKathreen Devoid  On: 08/17/2018 11:44   UKoreaThoracentesis Asp Pleural Space W/img Guide  Result Date: 08/26/2018 INDICATION: History of pneumonia with recurrent symptomatic right-sided pleural effusion. Please perform ultrasound-guided thoracentesis for diagnostic and therapeutic purposes. EXAM: [place]oreaTHORACENTESIS ASP PLEURAL SPACE W/IMG GUIDE COMPARISON:  Right-sided ultrasound-guided thoracentesis-08/17/2018 yielding 830 cc of AC, amber colored  fluid. Chest radiograph-earlier same day; 08/17/2018; chest CT-08/17/2018 MEDICATIONS: None. COMPLICATIONS: None immediate. TECHNIQUE: Informed written consent was obtained from the patient after a discussion of the risks, benefits and alternatives to treatment. A timeout was performed prior to the initiation of the procedure. Patient was unable to sit upright on the edge of his hospital bed and as such was positioned left lateral decubitus. Initial ultrasound scanning demonstrates a recurrent moderate sized anechoic right-sided pleural effusion. The lower chest was prepped and draped in the usual sterile fashion. 1% lidocaine was used for local anesthesia. Under direct ultrasound guidance, a 19 gauge, 7-cm, Yueh catheter was introduced. An ultrasound image was saved for documentation purposes. The thoracentesis was performed. The catheter was removed and a dressing was applied. The patient tolerated the procedure well without immediate post procedural complication. The patient was escorted to have an upright chest radiograph. FINDINGS: A total of approximately 1.4 liters of serous, slightly  blood tinged fluid was removed. Requested samples were sent to the laboratory. IMPRESSION: Successful ultrasound-guided right sided thoracentesis yielding 1.4 liters of serous, slightly blood tinged pleural fluid. Electronically Signed   By: Sandi Mariscal M.D.   On: 08/26/2018 13:28   US Thoracentesis Asp Pleural Space W/img Guide  Result Date: 08/17/2018 INDICATION: Patient with history of pneumonia, chronic left hip infection, bilateral pleural effusions right greater than left. Request made for diagnostic and therapeutic right thoracentesis. EXAM: ULTRASOUND GUIDED DIAGNOSTIC AND THERAPEUTIC RIGHT THORACENTESIS MEDICATIONS: None COMPLICATIONS: None immediate. PROCEDURE: An ultrasound guided thoracentesis was thoroughly discussed with the patient and questions answered. The benefits, risks, alternatives and complications were also discussed. The patient understands and wishes to proceed with the procedure. Written consent was obtained. Ultrasound was performed to localize and mark an adequate pocket of fluid in the right chest. The area was then prepped and draped in the normal sterile fashion. 1% Lidocaine was used for local anesthesia. Under ultrasound guidance a 6 Fr Safe-T-Centesis catheter was introduced. Thoracentesis was performed. The catheter was removed and a dressing applied. FINDINGS: A total of approximately 830 cc of hazy, amber fluid was removed. Samples were sent to the laboratory as requested by the clinical team. IMPRESSION: Successful ultrasound guided diagnostic and therapeutic right thoracentesis yielding 830 cc of pleural fluid. Read by: Rowe Robert, PA-C Electronically Signed   By: Jacqulynn Cadet M.D.   On: 08/17/2018 16:18     CBC Recent Labs  Lab 08/22/18 0403 08/25/18 0341 08/26/18 0449  WBC 6.3 6.7 6.1  HGB 8.1* 8.1* 8.4*  HCT 28.5* 28.3* 29.1*  PLT 265 279 329  MCV 93.8 93.4 90.9  MCH 26.6 26.7 26.3  MCHC 28.4* 28.6* 28.9*  RDW 16.4* 16.3* 16.1*  LYMPHSABS  --    --  1.5  MONOABS  --   --  0.6  EOSABS  --   --  0.4  BASOSABS  --   --  0.0    Chemistries  Recent Labs  Lab 08/22/18 0403 08/25/18 0341 08/26/18 0449 08/27/18 0921  NA 143 143 142 141  K 4.1 4.3 4.2 4.1  CL 108 108 106 105  CO2 _0 GLUCOSE 102* 100* 99 100*  BUN _1 CREATININE 0.84 0.89 0.91 0.87  CALCIUM 8.6* 8.6* 8.9 8.9   ------------------------------------------------------------------------------------------------------------------    Component Value Date/Time   BNP 373.9 (H) 08/17/2018 1106    Vernell Leep, MD, FACP, [facility]. [facility] Pager [phone]  If 7PM-7AM, please contact night-coverage [email or web address] Password [email or web address]  08/28/2018, 3:21 PM

## 2018-08-29 ENCOUNTER — Inpatient Hospital Stay (HOSPITAL_COMMUNITY): Payer: Medicare Other

## 2018-08-29 LAB — BASIC METABOLIC PANEL
Anion gap: 7 (ref 5–15)
BUN: 18 mg/dL (ref 8–23)
CO2: 29 mmol/L (ref 22–32)
Calcium: 8.6 mg/dL — ABNORMAL LOW (ref 8.9–10.3)
Chloride: 105 mmol/L (ref 98–111)
Creatinine, Ser: 0.94 mg/dL (ref 0.61–1.24)
GFR calc Af Amer: >60 mL/min (ref >60)
GFR calc non Af Amer: >60 mL/min (ref >60)
Glucose, Bld: 101 mg/dL — ABNORMAL HIGH (ref 70–99)
Potassium: 3.9 mmol/L (ref 3.5–5.1)
Sodium: 141 mmol/L (ref 135–145)

## 2018-08-29 LAB — CBC
HCT: 29.3 % — ABNORMAL LOW (ref 39.0–52.0)
Hemoglobin: 8.4 g/dL — ABNORMAL LOW (ref 13.0–17.0)
MCH: 26.6 pg (ref 26.0–34.0)
MCHC: 28.7 g/dL — ABNORMAL LOW (ref 30.0–36.0)
MCV: 92.7 fL (ref 80.0–100.0)
Platelets: 301 10*3/uL (ref 150–400)
RBC: 3.16 MIL/uL — ABNORMAL LOW (ref 4.22–5.81)
RDW: 17.1 % — ABNORMAL HIGH (ref 11.5–15.5)
WBC: 7 10*3/uL (ref 4.0–10.5)
nRBC: 0 % (ref 0.0–0.2)

## 2018-08-29 MED ORDER — CALCIUM CARBONATE ANTACID 500 MG PO CHEW
2.0000 | CHEWABLE_TABLET | Freq: Three times a day (TID) | ORAL | Status: DC | PRN
  Administered 2018-08-29 – 2018-09-02 (×5): 400 mg via ORAL
  Filled 2018-08-29 (×5): qty 2

## 2018-08-29 MED ORDER — FUROSEMIDE 10 MG/ML IJ SOLN
40.0000 mg | Freq: Once | INTRAMUSCULAR | Status: AC
  Administered 2018-08-29: 40 mg via INTRAVENOUS
  Filled 2018-08-29: qty 4

## 2018-08-29 NOTE — Progress Notes (Signed)
Patient Demographics:    [name], is a 72 y.o. male, DOB - 1946-04-29, AVW:[id number]  Admit date - 08/17/2018   Admitting Physician No admitting provider for patient encounter.  Outpatient Primary MD for the patient is [name], MD  LOS - 12   Chief Complaint  Patient presents with  . Abnormal Lab        Subjective:   Overnight events noted.  Concern regarding reaccumulation of pleural effusion and wants repeat chest x-ray.  No chest pain, cough reported.  Mild dyspnea on exertion.   Assessment  & Plan :    Principal Problem:   Atrial fibrillation with RVR (HCC) Active Problems:   Left hip postoperative wound infection   SIRS (systemic inflammatory response syndrome) (HCC)   Anemia of chronic disease   Depression   OSA (obstructive sleep apnea)   Obesity, Class III, BMI 40-49.9 (morbid obesity) (HCC)   Hypertensive urgency   Pleural effusion on left   Acute on chronic diastolic (congestive) heart failure (HCC)   OSA on CPAP  Brief Summary 72 y.o. male, with history of hypertension, A. 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 fib with RVR and also found to have right-sided pleural effusion, IR removed 830 mL of clear fluid, awaiting insurance approval for transfer back to SNF rehab   Assessment and plan:  1) Chronic Atrial Fibrillation with RVR improved rate control, continue Cardizem CD 180 mg daily, and metoprolol  25 mg bid,  last known EF was 50 to 55% , continue Eliquis for anticoagulation rate control is occasionally difficult.  Mostly controlled ventricular rate.  Stable.  Discontinue telemetry 12/29.  2)HFpEF with right-sided pleural effusion patient with history of chronic  diastolic dysfunction CHF suspect exacerbation secondary to A. fib with RVR, Right pleural effusion improved post thoracentesis on 08/17/2018 with removal of 830 mL of clear fluid (fluid Gram stain and culture negative, final report), fluid LDH 470 serum LDH 146, exudative effusion by Light's criteria,  PCCM appreciated, continue Lasix 40 mg twice daily, daily weights and strict I's and O's, supplemental oxygen as needed Repeat thoracentesis (1.4 L by IR) on 08/26/2018- is still concerning for exudative effusion.  Cytology negative.  Right pleural fluid culture 12/26: Negative to date.  As per pulmonology follow-up 12/27, fluid analysis consistent with exudative effusion but fluid was bloody however cytology negative.  Autoimmune work-up negative.  Clinically appears euvolemic.  Improved. Repeated chest x-ray 12/29 and personally reviewed.  Persistent small left effusion.?  Edema versus infection right mid lower lung-we will give a dose of IV Lasix 40 mg.  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----patient usually follows with infectious disease at [facility],  Completed IV ertapenem through 08/21/2018, ID physician at [facility] recommends Flagyl 500 twice daily for Bacteroides fragilis keep infection starting 08/22/2018 for 90 days, ESR is down to 60 from 107, CRP is down to 4.1 from 23.6  4)Morbid Obesity with OSA-- c/n CPAP nightly  5)Depression----stable,  Seroquel and Remeron were held due to concerns about QT prolongation,  continue Lexapro.  EKG 12/29  shows QTC 424 ms.  6)Chronic Anemia----etiology unclear, stool is Hemoccult positive at this time  , patient on Eliquis due to history of A. Fib , hemoglobin is currently 8.2, monitor H&H closely, continue Protonix Iron level still low.  Increased oral iron supplementation as well as continue B12. B12 is relatively low.  Replaced with subcu injection x1.  Hemoglobin stable in the 8 g range for several days.   Follow CBCs periodically.  7)Generalized Weakness/Debility--- PT eval appreciated, recommends SNF rehab awaiting insurance approval for SNF Rehab discharge ,  fatigue persist, difficulties with mobility related activities of daily living persist,, dyspnea on exertion with minimal activity persist  Nausea and vomiting: Unclear etiology.  No acute abdominal findings.  Suspect self-limiting.  Diet as tolerated and monitor closely.  Resolved.   Disposition/Need for in-Hospital Stay- patient unable to be discharged at this time due to awaiting insurance approval for SNF rehab transfer.  Awaiting clinical social work follow-up.  Code Status : Full  Disposition Plan  : SNF Rehab  DVT Prophylaxis  : Eliquis  Inpatient Medications  Scheduled Meds: . apixaban  5 mg Oral BID  . cholecalciferol  2,000 Units Oral QPM  . cycloSPORINE  1 drop Both Eyes BID  . diltiazem  180 mg Oral Daily  . escitalopram  10 mg Oral Daily  . ferrous sulfate  325 mg Oral TID WC  . gabapentin  100 mg Oral TID  . Melatonin  6 mg Oral QHS  . metoprolol tartrate  25 mg Oral BID  . metroNIDAZOLE  500 mg Oral BID  . multivitamin with minerals  1 tablet Oral QPM  . pantoprazole  40 mg Oral Daily  . rOPINIRole  0.25 mg Oral QHS  . senna-docusate  2 tablet Oral BID  . sodium chloride flush  10-40 mL Intracatheter Q12H  . thiamine  100 mg Oral Daily   Continuous Infusions:  PRN Meds:.acetaminophen **OR** acetaminophen, calcium carbonate, hydrOXYzine, ipratropium-albuterol, menthol-cetylpyridinium, ondansetron **OR** ondansetron (ZOFRAN) IV, oxyCODONE, phenol, polyethylene glycol, polyvinyl alcohol, promethazine, sodium chloride flush    Anti-infectives (From admission, onward)   Start     Dose/Rate Route Frequency Ordered Stop   08/22/18 1000  metroNIDAZOLE (FLAGYL) tablet 500 mg     500 mg Oral 2 times daily 08/21/18 1511 11/20/18 0959   08/21/18 1515  metroNIDAZOLE (FLAGYL) tablet 500 mg  Status:  Discontinued       500 mg Oral 2 times daily 08/21/18 1510 08/21/18 1511   08/17/18 1730  ertapenem (INVANZ) 1,000 mg in sodium chloride 0.9 % 100 mL IVPB  Status:  Discontinued     1 g 200 mL/hr over 30 Minutes Intravenous Every 24 hours 08/17/18 1720 08/17/18 1728   08/17/18 1200  ertapenem (INVANZ) 1,000 mg in sodium chloride 0.9 % 100 mL IVPB     1 g 200 mL/hr over 30 Minutes Intravenous Every 24 hours 08/17/18 1036 08/21/18 1308        Objective:   Vitals:   08/28/18 1859 08/28/18 2015 08/29/18 0622 08/29/18 1351  BP: 113/74 97/69 133/86 110/66  Pulse: 94 100 90 91  Resp: 18 20 18 14   Temp: 98 F (36.7 C) 99.9 F (37.7 C) 97.7 F (36.5 C) 99.2 F (37.3 C)  TempSrc: Oral Oral Oral Oral  SpO2: 92% 91% 91% 92%  Weight:   (!) 143.7 kg   Height:        Wt Readings from Last 3 Encounters:  08/29/18 (!) 143.7 kg  04/24/18 Marland Kitchen)  163.6 kg  03/17/18 (!) 155.6 kg     Intake/Output Summary (Last 24 hours) at 08/29/2018 1727 Last data filed at 08/29/2018 1527 Gross per 24 hour  Intake 360 ml  Output 1200 ml  Net -840 ml     Physical Exam Gen:-Pleasant elderly male, moderately built and nourished lying comfortably propped up in bed. Lungs-diminished breath sounds bibasilarly with bronchial breath sounds left base with occasional crackles.  Otherwise clear to auscultation.  Stable without change. CV-S1 and S2 heard, irregularly irregular.  No JVD.  Trace bilateral ankle edema.  No murmurs.  Telemetry personally reviewed: A. fib with controlled ventricular rate. abd-nondistended, soft and nontender.  No organomegaly or masses appreciated.  Normal bowel sounds heard.  Stable. Extremity/Skin:-  pedal pulses present.  Chronic patchy bilateral leg discoloration/hyperpigmentation. Psych-affect is flat. Neuro-alert and oriented x3.  No focal neurological deficits. MSK-left hip wound appears to have healed nicely  Data Review:   Micro Results Recent Results (from the past 240 hour(s))  Culture,  body fluid-bottle     Status: None (Preliminary result)   Collection Time: 08/26/18 12:52 PM  Result Value Ref Range Status   Specimen Description FLUID PLEURAL RIGHT  Final   Special Requests BOTTLES DRAWN AEROBIC AND ANAEROBIC  Final   Culture   Final    NO GROWTH 3 DAYS Performed at [facility], [place] [address] [place], [place] [address]    Report Status PENDING  Incomplete  Gram stain     Status: None   Collection Time: 08/26/18 12:52 PM  Result Value Ref Range Status   Specimen Description FLUID PLEURAL RIGHT  Final   Special Requests NONE  Final   Gram Stain   Final    CYTOSPIN SMEAR WBC PRESENT,BOTH PMN AND MONONUCLEAR NO ORGANISMS SEEN Performed at [facility], [address], [place], [place] [address]    Report Status 08/26/2018 FINAL  Final    Radiology Reports Dg Chest 1 View  Result Date: 08/26/2018 CLINICAL DATA:  Status post right thoracentesis today. EXAM: CHEST  1 VIEW COMPARISON:  Single-view of the chest 12/22 scratch the single view of the chest earlier today. FINDINGS: Right pleural effusion is markedly decreased after thoracentesis. No pneumothorax. Left effusion and airspace disease persist. There is cardiomegaly and vascular congestion. IMPRESSION: Marked decrease in right pleural effusion after thoracentesis. Negative for pneumothorax. No change in a left pleural effusion and airspace disease. Cardiomegaly and vascular congestion. Electronically Signed   By: Inge Rise M.D.   On: 08/26/2018 13:01   Dg Chest 1 View  Result Date: 08/17/2018 CLINICAL DATA:  Right-sided thoracentesis EXAM: CHEST  1 VIEW COMPARISON:  Portable chest x-ray of 08/17/2018 FINDINGS: A right thoracentesis has been performed. No pneumothorax is seen. Mild volume loss at the left lung base remains with small left effusion. Cardiomegaly is stable. Left PICC line tip is seen to the mid lower SVC. IMPRESSION: 1. No pneumothorax after right thoracentesis. 2. Little change  in opacity at the left lung base consistent with atelectasis and left effusion. Electronically Signed   By: Ivar Drape M.D.   On: 08/17/2018 16:23   Dg Chest 2 View  Result Date: 08/29/2018 CLINICAL DATA:  Right pleural effusion.  Cough EXAM: CHEST - 2 VIEW COMPARISON:  Chest radiograph 08/26/2018 FINDINGS: Monitoring leads overlie the patient. Stable cardiomegaly. Left upper extremity PICC line tip projects over the superior vena cava. Persistent moderate left pleural effusion with underlying opacities. Interval development of heterogeneous opacities  within the right mid and lower lung. IMPRESSION: 1. Persistent small left effusion with heterogeneous opacities left mid lower lung. 2. New heterogeneous opacities right mid lower lung which may represent edema or infection. Electronically Signed   By: Lovey Newcomer M.D.   On: 08/29/2018 12:03   Ct Angio Chest Pe W And/or Wo Contrast  Result Date: 08/17/2018 CLINICAL DATA:  Low hemoglobin with pneumonia. Dyspnea and fever. EXAM: CT ANGIOGRAPHY CHEST WITH CONTRAST TECHNIQUE: Multidetector CT imaging of the chest was performed using the standard protocol during bolus administration of intravenous contrast. Multiplanar CT image reconstructions and MIPs were obtained to evaluate the vascular anatomy. CONTRAST:  133m ISOVUE-370 IOPAMIDOL (ISOVUE-370) INJECTION 76% COMPARISON:  08/17/2018 CXR, chest CT 06/17/2018 FINDINGS: Cardiovascular: Conventional branch pattern of the great vessels with minimal atherosclerosis at the origin of the left subclavian. No aortic aneurysm or dissection. Mild aortic atherosclerosis without aneurysm or dissection. Scattered coronary arteriosclerosis. Satisfactory opacification of the pulmonary arteries to the proximal segmental level without pulmonary embolus. Heart size is mildly enlarged without pericardial effusion or thickening. Mediastinum/Nodes: Patent trachea and mainstem bronchi. No thyroid mass. The CT appearance of the  esophagus is unremarkable. No axillary, supraclavicular, mediastinal nor hilar adenopathy. Lungs/Pleura: New moderate to large right pleural effusion with trace to small left pleural effusion. Adjacent bibasilar and compressive atelectasis is noted. Atelectasis and/or scarring is also seen in the lingula. Respiratory motion artifact slightly limit assessment of the lung bases. No pneumothorax. No dominant mass. There appear to be scattered calcified pleural plaque along the periphery of the left hemithorax. Upper Abdomen: Cholecystectomy. 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 Increased fecal retention within the included colon. Musculoskeletal: Spondylosis of the dorsal spine. No acute nor aggressive osseous lesions. Review of the MIP images confirms the above findings. IMPRESSION: 1. No acute pulmonary embolus, aortic aneurysm or dissection. 2. New moderate to large right pleural effusion with trace to small left pleural effusion. There is adjacent compressive and subsegmental atelectasis at each lung base and lingula. Aortic Atherosclerosis (ICD10-I70.0). Electronically Signed   By: DAshley RoyaltyM.D.   On: 08/17/2018 14:34   Dg Chest Port 1 View  Result Date: 08/26/2018 CLINICAL DATA:  72year old male status post ultrasound-guided right side thoracentesis on 08/17/2018. EXAM: PORTABLE CHEST 1 VIEW COMPARISON:  08/25/2018 and earlier. FINDINGS: Portable AP semi upright view at 0451 hours. Stable left PICC line. Stable lung volumes and mediastinal contours. Increased veiling opacity at the right lung base since yesterday partially obscuring the right diaphragm. Persistent small left pleural effusion suspected. No pneumothorax. Stable pulmonary vascularity without acute edema. IMPRESSION: 1. Suspect re-accumulating right pleural effusion. Stable small left pleural effusion. 2. No new cardiopulmonary abnormality. Electronically Signed   By: HGenevie AnnM.D.   On: 08/26/2018 07:54   Dg Chest Port  1 View  Result Date: 08/25/2018 CLINICAL DATA:  Pleural effusion. EXAM: PORTABLE CHEST 1 VIEW COMPARISON:  08/17/2018 FINDINGS: 0517 hours. The cardio pericardial silhouette is enlarged. There is pulmonary vascular congestion without overt pulmonary edema. Interval progression of airspace disease at the right base with persistent left base collapse/consolidation. Small bilateral pleural effusions are evident. Telemetry leads overlie the chest. IMPRESSION: Cardiomegaly with bibasilar collapse/consolidation, progressive on the right. Small bilateral pleural effusions. Electronically Signed   By: EMisty StanleyM.D.   On: 08/25/2018 07:54   Dg Chest Portable 1 View  Result Date: 08/17/2018 CLINICAL DATA:  Right-sided chest pain EXAM: PORTABLE CHEST 1 VIEW COMPARISON:  CT chest 06/17/2018 FINDINGS: Small left  pleural effusion. Bilateral mild interstitial thickening no focal consolidation or pneumothorax. Stable cardiomegaly. Left-sided PICC line with the tip projecting over the SVC. Moderate osteoarthritis of the right shoulder. IMPRESSION: 1. Cardiomegaly with mild pulmonary vascular congestion. Electronically Signed   By: Kathreen Devoid   On: 08/17/2018 11:44   US Thoracentesis Asp Pleural Space W/img Guide  Result Date: 08/26/2018 INDICATION: History of pneumonia with recurrent symptomatic right-sided pleural effusion. Please perform ultrasound-guided thoracentesis for diagnostic and therapeutic purposes. EXAM: US THORACENTESIS ASP PLEURAL SPACE W/IMG GUIDE COMPARISON:  Right-sided ultrasound-guided thoracentesis-08/17/2018 yielding 830 cc of AC, amber colored fluid. Chest radiograph-earlier same day; 08/17/2018; chest CT-08/17/2018 MEDICATIONS: None. COMPLICATIONS: None immediate. TECHNIQUE: Informed written consent was obtained from the patient after a discussion of the risks, benefits and alternatives to treatment. A timeout was performed prior to the initiation of the procedure. Patient was unable to  sit upright on the edge of his hospital bed and as such was positioned left lateral decubitus. Initial ultrasound scanning demonstrates a recurrent moderate sized anechoic right-sided pleural effusion. The lower chest was prepped and draped in the usual sterile fashion. 1% lidocaine was used for local anesthesia. Under direct ultrasound guidance, a 19 gauge, 7-cm, Yueh catheter was introduced. An ultrasound image was saved for documentation purposes. The thoracentesis was performed. The catheter was removed and a dressing was applied. The patient tolerated the procedure well without immediate post procedural complication. The patient was escorted to have an upright chest radiograph. FINDINGS: A total of approximately 1.4 liters of serous, slightly blood tinged fluid was removed. Requested samples were sent to the laboratory. IMPRESSION: Successful ultrasound-guided right sided thoracentesis yielding 1.4 liters of serous, slightly blood tinged pleural fluid. Electronically Signed   By: Sandi Mariscal M.D.   On: 08/26/2018 13:28   US Thoracentesis Asp Pleural Space W/img Guide  Result Date: 08/17/2018 INDICATION: Patient with history of pneumonia, chronic left hip infection, bilateral pleural effusions right greater than left. Request made for diagnostic and therapeutic right thoracentesis. EXAM: ULTRASOUND GUIDED DIAGNOSTIC AND THERAPEUTIC RIGHT THORACENTESIS MEDICATIONS: None COMPLICATIONS: None immediate. PROCEDURE: An ultrasound guided thoracentesis was thoroughly discussed with the patient and questions answered. The benefits, risks, alternatives and complications were also discussed. The patient understands and wishes to proceed with the procedure. Written consent was obtained. Ultrasound was performed to localize and mark an adequate pocket of fluid in the right chest. The area was then prepped and draped in the normal sterile fashion. 1% Lidocaine was used for local anesthesia. Under ultrasound guidance a 6  Fr Safe-T-Centesis catheter was introduced. Thoracentesis was performed. The catheter was removed and a dressing applied. FINDINGS: A total of approximately 830 cc of hazy, amber fluid was removed. Samples were sent to the laboratory as requested by the clinical team. IMPRESSION: Successful ultrasound guided diagnostic and therapeutic right thoracentesis yielding 830 cc of pleural fluid. Read by: Rowe Robert, PA-C Electronically Signed   By: Jacqulynn Cadet M.D.   On: 08/17/2018 16:18     CBC Recent Labs  Lab 08/25/18 0341 08/26/18 0449 08/29/18 0432  WBC 6.7 6.1 7.0  HGB 8.1* 8.4* 8.4*  HCT 28.3* 29.1* 29.3*  PLT 279 329 301  MCV 93.4 90.9 92.7  MCH 26.7 26.3 26.6  MCHC 28.6* 28.9* 28.7*  RDW 16.3* 16.1* 17.1*  LYMPHSABS  --  1.5  --   MONOABS  --  0.6  --   EOSABS  --  0.4  --   BASOSABS  --  0.0  --  Chemistries  Recent Labs  Lab 08/25/18 0341 08/26/18 0449 08/27/18 0921 08/29/18 0432  NA 143 142 141 141  K 4.3 4.2 4.1 3.9  CL 108 106 105 105  CO2 27 29 29 29   GLUCOSE 100* 99 100* 101*  BUN 18 18 17 18   CREATININE 0.89 0.91 0.87 0.94  CALCIUM 8.6* 8.9 8.9 8.6*   ------------------------------------------------------------------------------------------------------------------    Component Value Date/Time   BNP 373.9 (H) 08/17/2018 1106    Vernell Leep, MD, FACP, [facility]. [facility] Pager [phone]  If 7PM-7AM, please contact night-coverage [email or web address] Password [facility] 08/29/2018, 5:27 PM

## 2018-08-29 NOTE — Plan of Care (Signed)
Pt only able to ambulate to just outside of his room, then needed to go back to bed. Pt is worried that if he doesn't get a chest x-ray before he is D/C'd, that he may have to come back to hosp. To have more fluid drained from lung.

## 2018-08-30 DIAGNOSIS — Z9889 Other specified postprocedural states: Secondary | ICD-10-CM

## 2018-08-30 DIAGNOSIS — I4891 Unspecified atrial fibrillation: Secondary | ICD-10-CM

## 2018-08-30 DIAGNOSIS — I509 Heart failure, unspecified: Secondary | ICD-10-CM

## 2018-08-30 LAB — BASIC METABOLIC PANEL
Anion gap: 8 (ref 5–15)
BUN: 16 mg/dL (ref 8–23)
CO2: 29 mmol/L (ref 22–32)
Calcium: 9 mg/dL (ref 8.9–10.3)
Chloride: 105 mmol/L (ref 98–111)
Creatinine, Ser: 0.84 mg/dL (ref 0.61–1.24)
GFR calc Af Amer: >60 mL/min (ref >60)
GFR calc non Af Amer: >60 mL/min (ref >60)
Glucose, Bld: 103 mg/dL — ABNORMAL HIGH (ref 70–99)
Potassium: 3.5 mmol/L (ref 3.5–5.1)
Sodium: 142 mmol/L (ref 135–145)

## 2018-08-30 MED ORDER — POTASSIUM CHLORIDE CRYS ER 20 MEQ PO TBCR
20.0000 meq | EXTENDED_RELEASE_TABLET | Freq: Every day | ORAL | Status: DC
  Administered 2018-08-30 – 2018-09-02 (×4): 20 meq via ORAL
  Filled 2018-08-30 (×4): qty 1

## 2018-08-30 MED ORDER — FUROSEMIDE 20 MG PO TABS
20.0000 mg | ORAL_TABLET | Freq: Every day | ORAL | Status: DC
  Administered 2018-08-30 – 2018-09-02 (×4): 20 mg via ORAL
  Filled 2018-08-30 (×4): qty 1

## 2018-08-30 NOTE — Progress Notes (Signed)
   NAME:  [name], MRN:  [id number], DOB:  [birth year], LOS: 15 ADMISSION DATE:  08/17/2018, CONSULTATION DATE:  08/24/2018 REFERRING MD:  Hermelinda Dellen, CHIEF COMPLAINT:  Pleural effusion   Brief History   72 year old male with giant cell arteritis now off prednisone who just had a very complicated course in [facility] to include hip fracture after a fall, intubation and delirium, please see H&P for extensive details.  Patient was brought to [facility] for CP and SOB and was noted to have R>L pleural effusion and the right was tapped.  Pleural fluid analysis was inconsistent and PCCM was called on consultation.  Patient denies any previous history of pleural effusion or thoracentesis in the past but did have new onset rapid a-fib during this admission.     Past Medical History  See below  Significant Hospital Events   [place]ora  Consults:  PCCM  Procedures:  [place]ora 12/17  Significant Diagnostic Tests:  Pleural effusion analysis with protein <3, LDH 473, WBC of 2815 with 40% PMNs and 26% mono, cytology is negative for malignancy  Micro Data:  Pleural 12/17 negative Blood 12/17 negative  Antimicrobials:  Flagyl chronically form [facility]   Interim history/subjective:  Feels better   Objective   Blood pressure 111/78, pulse 91, temperature 97.9 F (36.6 C), temperature source Oral, resp. rate 18, height 5\' 8"  (1.727 m), weight (Abnormal) 142.9 kg, SpO2 93 %.        Intake/Output Summary (Last 24 hours) at 08/30/2018 1121 Last data filed at 08/30/2018 0934 Gross per 24 hour  Intake 840 ml  Output 3300 ml  Net -2460 ml   Filed Weights   08/26/18 0432 08/29/18 0622 08/30/18 0505  Weight: (Abnormal) 144.7 kg (Abnormal) 143.7 kg (Abnormal) 142.9 kg   Examination: General 72 year old white male resting in chair HENT NCAT + JVD MMM Card Reg irreg af on last 12 lead pulm dec bases no accessory use Ext warm LE edema chronic venous stasis changes abd soft, not tender + bowel  sounds Neuro intact    Resolved Hospital Problem list     Assessment & Plan:   Recurrent Exudative pleural effusion by LDH criteria only with cell analysis that is inconsistent with a bacterial infection and negative cytology -Ddx: pseudo-exudate (was on diuretics) vs parapneumonic process. Second repeat was bloody but perhaps this was iatrogenic following the initial thora,  -pcxr personally reviewed>: R>L airspace disease likely representing element of edema and atx Plan Cont lasix PRN CXR  Atrial fib w/ RVR and volume overload  Plan Cont lasix  Cont DOAC   Left hip infection  Plan Cont flagyl per ID at [facility]   OSA Plan CPAP at HS  Chronic anemia w/out evidence of bleeding Plan Cont trend [place] ACNP-BC [place] Pager # [phone] OR # [phone] if no answer

## 2018-08-30 NOTE — Progress Notes (Signed)
Physical Therapy Treatment Patient Details Name: [name] MRN: [id number] DOB: [birth year] Today's Date: 08/30/2018    History of Present Illness 72 y.o. male, with history of hypertension, A. fib on Eliquis, giant cell arteritis, OSA on CPAP, left total hip arthroplasty, L hip infection, anxiety, depression, morbid obesity; recent prolonged hospital stay at [facility] with  altered mental status, respiratory distress requiring intubation, was then transferred to [facility],  extubated at [facility] where he had further prolonged stay with delirium, had a fall from bed injuring his left hip with x-ray showing new linear linear peri-prosthetic fracture; admitted to [facility] on 08/17/18 with fatigue and cough, was found to be in afib in ED; underwent thoracentesis 12/17      PT Comments    Progressing with mobility. Pt was motivated to participate. Pt was able to increase his ambulation distance today. Distance remains limited by fatigue and dyspnea.  Continue to recommend SNF.   Follow Up Recommendations  SNF     Equipment Recommendations  None recommended by PT    Recommendations for Other Services       Precautions / Restrictions Restrictions Weight Bearing Restrictions: No Other Position/Activity Restrictions: per pt he is allowed WBAT; periprosthetic was > 2 mos ago per notes, pt was amb at [place] bed mobility: Needs Assistance Bed Mobility: Supine to Sit     Supine to sit: HOB elevated;Mod assist     General bed mobility comments: Assist for trunk. Increased time. Pt relied heavily on rails.   Transfers Overall transfer level: Needs assistance Equipment used: Rolling walker (2 wheeled) Transfers: Sit to/from Stand Sit to Stand: Min assist;+2 physical assistance;+2 safety/equipment;From elevated surface         General transfer comment: Assist to rise, stabilize, control descent. VCs safety, hand placement. Increased  time.   Ambulation/Gait Ambulation/Gait assistance: Min assist;+2 safety/equipment Gait Distance (Feet): 16 Feet Assistive device: Rolling walker (2 wheeled) Gait Pattern/deviations: Step-to pattern;Wide base of support;Trunk flexed     General Gait Details: Assist to steady throughout distance. Slow gait speed. Followed closely with recliner. Pt took 2 brief standing rest breaks during short walk   Stairs             Wheelchair Mobility    Modified Rankin (Stroke Patients Only)       Balance Overall balance assessment: Needs assistance         Standing balance support: Bilateral upper extremity supported Standing balance-[name]: Poor                              Cognition Arousal/Alertness: Awake/alert Behavior During Therapy: WFL for tasks assessed/performed Overall Cognitive Status: Within Functional Limits for tasks assessed                                        Exercises      General Comments        Pertinent Vitals/Pain Pain Assessment: Faces Faces Pain Scale: Hurts even more Pain Location: back Pain Descriptors / Indicators: Discomfort;Grimacing Pain Intervention(s): Monitored during session;Repositioned    Home Living                      Prior Function            PT Goals (  current goals can now be found in the care plan section) Progress towards PT goals: Progressing toward goals    Frequency    Min 2X/week      PT Plan Current plan remains appropriate    Co-evaluation              AM-PAC PT "6 Clicks" Mobility   Outcome Measure  Help needed turning from your back to your side while in a flat bed without using bedrails?: A Lot Help needed moving from lying on your back to sitting on the side of a flat bed without using bedrails?: A Lot Help needed moving to and from a bed to a chair (including a wheelchair)?: A Lot Help needed standing up from a chair using your arms (e.g.,  wheelchair or bedside chair)?: A Lot Help needed to walk in hospital room?: A Lot Help needed climbing 3-5 steps with a railing? : Total 6 Click Score: 11    End of Session Equipment Utilized During Treatment: Gait belt Activity Tolerance: Patient limited by fatigue(limited by dyspnea) Patient left: in chair;with call bell/phone within reach   PT Visit Diagnosis: History of falling (Z91.81);Unsteadiness on feet (R26.81);Muscle weakness (generalized) (M62.81);Difficulty in walking, not elsewhere classified (R26.2)     Time: 3704-8889 PT Time Calculation (min) (ACUTE ONLY): 25 min  Charges:  $Gait Training: 8-22 mins $Therapeutic Activity: 8-22 mins                        Weston Anna, PT Acute Rehabilitation Services Pager: [phone] Office: [phone]

## 2018-08-30 NOTE — Progress Notes (Signed)
Patient Demographics:    [name], is a 72 y.o. male, DOB - 10/05/1945, QDI:[id number]  Admit date - 08/17/2018   Admitting Physician No admitting provider for patient encounter.  Outpatient Primary MD for the patient is [name], MD  LOS - 13   Chief Complaint  Patient presents with  . Abnormal Lab        Subjective:   Patient states that he feels much better.  Breathing significantly improved after dose of Lasix last night.  Denies any other complaints.   Assessment  & Plan :    Principal Problem:   Atrial fibrillation with RVR (HCC) Active Problems:   Left hip postoperative wound infection   SIRS (systemic inflammatory response syndrome) (HCC)   Anemia of chronic disease   Depression   OSA (obstructive sleep apnea)   Obesity, Class III, BMI 40-49.9 (morbid obesity) (HCC)   Hypertensive urgency   Pleural effusion on left   Acute on chronic diastolic (congestive) heart failure (HCC)   OSA on CPAP  Brief Summary 72 y.o. male, with history of hypertension, A. 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 fib with RVR and also found to have right-sided pleural effusion, IR removed 830 mL of clear fluid, awaiting insurance approval for transfer back to SNF rehab   Assessment and plan:  1) Chronic Atrial Fibrillation with RVR improved rate control, continue Cardizem CD 180 mg daily, and metoprolol  25 mg bid,  last known EF was 50 to 55% , continue Eliquis for anticoagulation rate control is occasionally difficult.  Mostly controlled ventricular rate.  Stable.  Discontinued telemetry 12/29.  2)HFpEF with right-sided pleural effusion patient with history of chronic diastolic dysfunction CHF suspect  exacerbation secondary to A. fib with RVR, Right pleural effusion improved post thoracentesis on 08/17/2018 with removal of 830 mL of clear fluid (fluid Gram stain and culture negative, final report), fluid LDH 470 serum LDH 146, exudative effusion by Light's criteria,  PCCM appreciated, continue Lasix 40 mg twice daily, daily weights and strict I's and O's, supplemental oxygen as needed Repeat thoracentesis (1.4 L by IR) on 08/26/2018- is still concerning for exudative effusion.  Cytology negative.  Right pleural fluid culture 12/26: Negative to date.  As per pulmonology follow-up 12/27, fluid analysis consistent with exudative effusion but fluid was bloody however cytology negative.  Autoimmune work-up negative.  Clinically appears euvolemic.  Improved. Repeated chest x-ray 12/29 and personally reviewed.  Persistent small left effusion.?  Edema versus infection right mid lower lung- received a dose of IV Lasix 40 mg with good effect.  We will continue low-dose Lasix 20 mg orally daily for a few days and then reassess at SNF with chest x-ray.  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  EKG 12/29 shows QTC 424 ms.  6)Chronic Anemia----etiology unclear, stool is Hemoccult positive at this time  , patient on Eliquis due to history of A. Fib , hemoglobin is currently 8.2, monitor H&H closely, continue Protonix Iron level still low.  Increased oral iron supplementation as well as continue B12. B12 is relatively low.   Replaced with subcu injection x1.  Hemoglobin stable in the 8 g range for several days.  Follow CBCs periodically.  7)Generalized Weakness/Debility--- PT eval appreciated, recommends SNF rehab awaiting insurance approval for SNF Rehab discharge ,  fatigue persist, difficulties with mobility related activities of daily living persist,, dyspnea on exertion with minimal activity persist  Nausea and vomiting: Unclear etiology.  No acute abdominal findings.  Suspect self-limiting.  Diet as tolerated and monitor closely.  Resolved.   Disposition/Need for in-Hospital Stay- patient unable to be discharged at this time due to awaiting insurance approval for SNF rehab transfer.  Social work input appreciated, patient has now selected an SNF and insurance authorization is in process.  Code Status : Full  Disposition Plan  : SNF Rehab  DVT Prophylaxis  : Eliquis  Inpatient Medications  Scheduled Meds: . apixaban  5 mg Oral BID  . cholecalciferol  2,000 Units Oral QPM  . cycloSPORINE  1 drop Both Eyes BID  . diltiazem  180 mg Oral Daily  . escitalopram  10 mg Oral Daily  . ferrous sulfate  325 mg Oral TID WC  . gabapentin  100 mg Oral TID  . Melatonin  6 mg Oral QHS  . metoprolol tartrate  25 mg Oral BID  . metroNIDAZOLE  500 mg Oral BID  . multivitamin with minerals  1 tablet Oral QPM  . pantoprazole  40 mg Oral Daily  . rOPINIRole  0.25 mg Oral QHS  . senna-docusate  2 tablet Oral BID  . sodium chloride flush  10-40 mL Intracatheter Q12H  . thiamine  100 mg Oral Daily   Continuous Infusions:  PRN Meds:.acetaminophen **OR** acetaminophen, calcium carbonate, hydrOXYzine, ipratropium-albuterol, menthol-cetylpyridinium, ondansetron **OR** ondansetron (ZOFRAN) IV, oxyCODONE, phenol, polyethylene glycol, polyvinyl alcohol, promethazine, sodium chloride flush    Anti-infectives (From admission, onward)   Start     Dose/Rate Route Frequency Ordered Stop   08/22/18 1000  metroNIDAZOLE (FLAGYL)  tablet 500 mg     500 mg Oral 2 times daily 08/21/18 1511 11/20/18 0959   08/21/18 1515  metroNIDAZOLE (FLAGYL) tablet 500 mg  Status:  Discontinued     500 mg Oral 2 times daily 08/21/18 1510 08/21/18 1511   08/17/18 1730  ertapenem (INVANZ) 1,000 mg in sodium chloride 0.9 % 100 mL IVPB  Status:  Discontinued     1 g 200 mL/hr over 30 Minutes Intravenous Every 24 hours 08/17/18 1720 08/17/18 1728   08/17/18 1200  ertapenem (INVANZ) 1,000 mg in sodium chloride 0.9 % 100 mL IVPB     1 g 200 mL/hr over 30 Minutes Intravenous Every 24 hours 08/17/18 1036 08/21/18 1308        Objective:   Vitals:   08/29/18 1351 08/29/18 2159 08/30/18 0505 08/30/18 1610  BP: 110/66 104/67 111/78 110/71  Pulse: 91 (!) 101 91 83  Resp: 14 18 18 16   Temp: 99.2 F (37.3 C) 99.7 F (37.6 C) 97.9 F (36.6 C) 99.4 F (37.4 C)  TempSrc: Oral Oral Oral Oral  SpO2: 92% 91% 93% 94%  Weight:   (!) 142.9 kg   Height:        Wt Readings from Last 3 Encounters:  08/30/18 (!) 142.9 kg  04/24/18 (!) 163.6 kg  03/17/18 (!) 155.6 kg     Intake/Output Summary (Last 24 hours) at 08/30/2018 1840 Last data filed at 08/30/2018 1726 Gross per 24 hour  Intake 620 ml  Output 3000 ml  Net -2380 ml     Physical Exam Gen:-Pleasant elderly male, moderately built and nourished lying comfortably propped up in bed.  Appears in good spirits today. Lungs-improved breath sounds.  Slightly diminished in the bases with occasional basal crackles but otherwise clear to auscultation.  No increased work of breathing. CV-S1 and S2 heard, irregularly irregular.  No JVD.  Trace bilateral ankle edema.  No murmurs.   abd-nondistended, soft and nontender.  No organomegaly or masses appreciated.  Normal bowel sounds heard.  Stable. Extremity/Skin:-  pedal pulses present.  Chronic patchy bilateral leg discoloration/hyperpigmentation. Psych-affect is flat. Neuro-alert and oriented x3.  No focal neurological deficits. MSK-left hip  wound appears to have healed nicely  Data Review:   Micro Results Recent Results (from the past 240 hour(s))  Culture, body fluid-bottle     Status: None (Preliminary result)   Collection Time: 08/26/18 12:52 PM  Result Value Ref Range Status   Specimen Description FLUID PLEURAL RIGHT  Final   Special Requests BOTTLES DRAWN AEROBIC AND ANAEROBIC  Final   Culture   Final    NO GROWTH 4 DAYS Performed at [facility], [address], [place], [place] [address]    Report Status PENDING  Incomplete  Gram stain     Status: None   Collection Time: 08/26/18 12:52 PM  Result Value Ref Range Status   Specimen Description FLUID PLEURAL RIGHT  Final   Special Requests NONE  Final   Gram Stain   Final    CYTOSPIN SMEAR WBC PRESENT,BOTH PMN AND MONONUCLEAR NO ORGANISMS SEEN Performed at [facility], [address] [place], [place] [address]    Report Status 08/26/2018 FINAL  Final    Radiology Reports Dg Chest 1 View  Result Date: 08/26/2018 CLINICAL DATA:  Status post right thoracentesis today. EXAM: CHEST  1 VIEW COMPARISON:  Single-view of the chest 12/22 scratch the single view of the chest earlier today. FINDINGS: Right pleural effusion is markedly decreased after thoracentesis. No pneumothorax. Left effusion and airspace disease persist. There is cardiomegaly and vascular congestion. IMPRESSION: Marked decrease in right pleural effusion after thoracentesis. Negative for pneumothorax. No change in a left pleural effusion and airspace disease. Cardiomegaly and vascular congestion. Electronically Signed   By: Inge Rise M.D.   On: 08/26/2018 13:01   Dg Chest 1 View  Result Date: 08/17/2018 CLINICAL DATA:  Right-sided thoracentesis EXAM: CHEST  1 VIEW COMPARISON:  Portable chest x-ray of 08/17/2018 FINDINGS: A right thoracentesis has been performed. No pneumothorax is seen. Mild volume loss at the left lung base remains with small left effusion. Cardiomegaly is stable.  Left PICC line tip is seen to the mid lower SVC. IMPRESSION: 1. No pneumothorax after right thoracentesis. 2. Little change in opacity at the left lung base consistent with atelectasis and left effusion. Electronically Signed   By: Ivar Drape M.D.   On: 08/17/2018 16:23   Dg Chest 2 View  Result Date: 08/29/2018 CLINICAL DATA:  Right pleural effusion.  Cough EXAM: CHEST - 2 VIEW COMPARISON:  Chest radiograph 08/26/2018 FINDINGS: Monitoring leads  overlie the patient. Stable cardiomegaly. Left upper extremity PICC line tip projects over the superior vena cava. Persistent moderate left pleural effusion with underlying opacities. Interval development of heterogeneous opacities within the right mid and lower lung. IMPRESSION: 1. Persistent small left effusion with heterogeneous opacities left mid lower lung. 2. New heterogeneous opacities right mid lower lung which may represent edema or infection. Electronically Signed   By: Lovey Newcomer M.D.   On: 08/29/2018 12:03   Ct Angio Chest Pe W And/or Wo Contrast  Result Date: 08/17/2018 CLINICAL DATA:  Low hemoglobin with pneumonia. Dyspnea and fever. EXAM: CT ANGIOGRAPHY CHEST WITH CONTRAST TECHNIQUE: Multidetector CT imaging of the chest was performed using the standard protocol during bolus administration of intravenous contrast. Multiplanar CT image reconstructions and MIPs were obtained to evaluate the vascular anatomy. CONTRAST:  176m ISOVUE-370 IOPAMIDOL (ISOVUE-370) INJECTION 76% COMPARISON:  08/17/2018 CXR, chest CT 06/17/2018 FINDINGS: Cardiovascular: Conventional branch pattern of the great vessels with minimal atherosclerosis at the origin of the left subclavian. No aortic aneurysm or dissection. Mild aortic atherosclerosis without aneurysm or dissection. Scattered coronary arteriosclerosis. Satisfactory opacification of the pulmonary arteries to the proximal segmental level without pulmonary embolus. Heart size is mildly enlarged without pericardial  effusion or thickening. Mediastinum/Nodes: Patent trachea and mainstem bronchi. No thyroid mass. The CT appearance of the esophagus is unremarkable. No axillary, supraclavicular, mediastinal nor hilar adenopathy. Lungs/Pleura: New moderate to large right pleural effusion with trace to small left pleural effusion. Adjacent bibasilar and compressive atelectasis is noted. Atelectasis and/or scarring is also seen in the lingula. Respiratory motion artifact slightly limit assessment of the lung bases. No pneumothorax. No dominant mass. There appear to be scattered calcified pleural plaque along the periphery of the left hemithorax. Upper Abdomen: Cholecystectomy. 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 Increased fecal retention within the included colon. Musculoskeletal: Spondylosis of the dorsal spine. No acute nor aggressive osseous lesions. Review of the MIP images confirms the above findings. IMPRESSION: 1. No acute pulmonary embolus, aortic aneurysm or dissection. 2. New moderate to large right pleural effusion with trace to small left pleural effusion. There is adjacent compressive and subsegmental atelectasis at each lung base and lingula. Aortic Atherosclerosis (ICD10-I70.0). Electronically Signed   By: DAshley RoyaltyM.D.   On: 08/17/2018 14:34   Dg Chest Port 1 View  Result Date: 08/26/2018 CLINICAL DATA:  72year old male status post ultrasound-guided right side thoracentesis on 08/17/2018. EXAM: PORTABLE CHEST 1 VIEW COMPARISON:  08/25/2018 and earlier. FINDINGS: Portable AP semi upright view at 0451 hours. Stable left PICC line. Stable lung volumes and mediastinal contours. Increased veiling opacity at the right lung base since yesterday partially obscuring the right diaphragm. Persistent small left pleural effusion suspected. No pneumothorax. Stable pulmonary vascularity without acute edema. IMPRESSION: 1. Suspect re-accumulating right pleural effusion. Stable small left pleural  effusion. 2. No new cardiopulmonary abnormality. Electronically Signed   By: HGenevie AnnM.D.   On: 08/26/2018 07:54   Dg Chest Port 1 View  Result Date: 08/25/2018 CLINICAL DATA:  Pleural effusion. EXAM: PORTABLE CHEST 1 VIEW COMPARISON:  08/17/2018 FINDINGS: 0517 hours. The cardio pericardial silhouette is enlarged. There is pulmonary vascular congestion without overt pulmonary edema. Interval progression of airspace disease at the right base with persistent left base collapse/consolidation. Small bilateral pleural effusions are evident. Telemetry leads overlie the chest. IMPRESSION: Cardiomegaly with bibasilar collapse/consolidation, progressive on the right. Small bilateral pleural effusions. Electronically Signed   By: EMisty StanleyM.D.   On: 08/25/2018 07:54  Dg Chest Portable 1 View  Result Date: 08/17/2018 CLINICAL DATA:  Right-sided chest pain EXAM: PORTABLE CHEST 1 VIEW COMPARISON:  CT chest 06/17/2018 FINDINGS: Small left pleural effusion. Bilateral mild interstitial thickening no focal consolidation or pneumothorax. Stable cardiomegaly. Left-sided PICC line with the tip projecting over the SVC. Moderate osteoarthritis of the right shoulder. IMPRESSION: 1. Cardiomegaly with mild pulmonary vascular congestion. Electronically Signed   By: Kathreen Devoid   On: 08/17/2018 11:44   US Thoracentesis Asp Pleural Space W/img Guide  Result Date: 08/26/2018 INDICATION: History of pneumonia with recurrent symptomatic right-sided pleural effusion. Please perform ultrasound-guided thoracentesis for diagnostic and therapeutic purposes. EXAM: US THORACENTESIS ASP PLEURAL SPACE W/IMG GUIDE COMPARISON:  Right-sided ultrasound-guided thoracentesis-08/17/2018 yielding 830 cc of AC, amber colored fluid. Chest radiograph-earlier same day; 08/17/2018; chest CT-08/17/2018 MEDICATIONS: None. COMPLICATIONS: None immediate. TECHNIQUE: Informed written consent was obtained from the patient after a discussion of the risks,  benefits and alternatives to treatment. A timeout was performed prior to the initiation of the procedure. Patient was unable to sit upright on the edge of his hospital bed and as such was positioned left lateral decubitus. Initial ultrasound scanning demonstrates a recurrent moderate sized anechoic right-sided pleural effusion. The lower chest was prepped and draped in the usual sterile fashion. 1% lidocaine was used for local anesthesia. Under direct ultrasound guidance, a 19 gauge, 7-cm, Yueh catheter was introduced. An ultrasound image was saved for documentation purposes. The thoracentesis was performed. The catheter was removed and a dressing was applied. The patient tolerated the procedure well without immediate post procedural complication. The patient was escorted to have an upright chest radiograph. FINDINGS: A total of approximately 1.4 liters of serous, slightly blood tinged fluid was removed. Requested samples were sent to the laboratory. IMPRESSION: Successful ultrasound-guided right sided thoracentesis yielding 1.4 liters of serous, slightly blood tinged pleural fluid. Electronically Signed   By: Sandi Mariscal M.D.   On: 08/26/2018 13:28   US Thoracentesis Asp Pleural Space W/img Guide  Result Date: 08/17/2018 INDICATION: Patient with history of pneumonia, chronic left hip infection, bilateral pleural effusions right greater than left. Request made for diagnostic and therapeutic right thoracentesis. EXAM: ULTRASOUND GUIDED DIAGNOSTIC AND THERAPEUTIC RIGHT THORACENTESIS MEDICATIONS: None COMPLICATIONS: None immediate. PROCEDURE: An ultrasound guided thoracentesis was thoroughly discussed with the patient and questions answered. The benefits, risks, alternatives and complications were also discussed. The patient understands and wishes to proceed with the procedure. Written consent was obtained. Ultrasound was performed to localize and mark an adequate pocket of fluid in the right chest. The area was  then prepped and draped in the normal sterile fashion. 1% Lidocaine was used for local anesthesia. Under ultrasound guidance a 6 Fr Safe-T-Centesis catheter was introduced. Thoracentesis was performed. The catheter was removed and a dressing applied. FINDINGS: A total of approximately 830 cc of hazy, amber fluid was removed. Samples were sent to the laboratory as requested by the clinical team. IMPRESSION: Successful ultrasound guided diagnostic and therapeutic right thoracentesis yielding 830 cc of pleural fluid. Read by: Rowe Robert, PA-C Electronically Signed   By: Jacqulynn Cadet M.D.   On: 08/17/2018 16:18     CBC Recent Labs  Lab 08/25/18 0341 08/26/18 0449 08/29/18 0432  WBC 6.7 6.1 7.0  HGB 8.1* 8.4* 8.4*  HCT 28.3* 29.1* 29.3*  PLT 279 329 301  MCV 93.4 90.9 92.7  MCH 26.7 26.3 26.6  MCHC 28.6* 28.9* 28.7*  RDW 16.3* 16.1* 17.1*  LYMPHSABS  --  1.5  --  MONOABS  --  0.6  --   EOSABS  --  0.4  --   BASOSABS  --  0.0  --     Chemistries  Recent Labs  Lab 08/25/18 0341 08/26/18 0449 08/27/18 0921 08/29/18 0432 08/30/18 0432  NA 143 142 141 141 142  K 4.3 4.2 4.1 3.9 3.5  CL 108 106 105 105 105  CO2 27 29 29 29 29   GLUCOSE 100* 99 100* 101* 103*  BUN 18 18 17 18 16   CREATININE 0.89 0.91 0.87 0.94 0.84  CALCIUM 8.6* 8.9 8.9 8.6* 9.0   ------------------------------------------------------------------------------------------------------------------    Component Value Date/Time   BNP 373.9 (H) 08/17/2018 1106    Vernell Leep, MD, FACP, [facility]. [facility] Pager [phone]  If 7PM-7AM, please contact night-coverage [email or web address] Password [facility] 08/30/2018, 6:40 PM

## 2018-08-30 NOTE — Plan of Care (Signed)
Pt refused to get OOB on day shift, but then wanted to get in chair at change of shift. Pt fears that if he gets OOB into chair, that when he is ready to get back in bed he will have to wait too long.

## 2018-08-30 NOTE — Progress Notes (Signed)
Patient agreeable to [facility]. [place] started the insurance authorization process. CSW will inform the patient and medical team when it has been received.   Kathrin Greathouse, Marlinda Mike, MSW Clinical Social Worker  [phone] 08/30/2018  3:01 PM

## 2018-08-30 NOTE — Care Management Important Message (Signed)
Important Message  Patient Details  Name: ABDULLA POOLEY MRN: [id number] Date of Birth: [birth year]   Medicare Important Message Given:  Yes    Kerin Salen 08/30/2018, 11:12 AMImportant Message  Patient Details  Name: JAFET WISSING MRN: [id number] Date of Birth: [birth year]   Medicare Important Message Given:  Yes    Kerin Salen 08/30/2018, 11:12 AM

## 2018-08-30 NOTE — Progress Notes (Signed)
Patient now agreeable to SNF for short rehab. Patient is agreeable to insurance co-pay at [facility]. CSW provided bed offers to the patient.  Patient will need prior insurance approval before discharging. Patient reports understanding.   Kathrin Greathouse, Marlinda Mike, MSW Clinical Social Worker  [phone] 08/30/2018  12:29 PM

## 2018-08-31 ENCOUNTER — Telehealth: Payer: Self-pay | Admitting: Pulmonary Disease

## 2018-08-31 LAB — BASIC METABOLIC PANEL
Anion gap: 8 (ref 5–15)
BUN: 16 mg/dL (ref 8–23)
CO2: 30 mmol/L (ref 22–32)
Calcium: 8.6 mg/dL — ABNORMAL LOW (ref 8.9–10.3)
Chloride: 104 mmol/L (ref 98–111)
Creatinine, Ser: 0.92 mg/dL (ref 0.61–1.24)
GFR calc Af Amer: >60 mL/min (ref >60)
GFR calc non Af Amer: >60 mL/min (ref >60)
Glucose, Bld: 104 mg/dL — ABNORMAL HIGH (ref 70–99)
Potassium: 3.9 mmol/L (ref 3.5–5.1)
Sodium: 142 mmol/L (ref 135–145)

## 2018-08-31 LAB — CULTURE, BODY FLUID W GRAM STAIN -BOTTLE: Culture: NO GROWTH

## 2018-08-31 NOTE — Telephone Encounter (Signed)
Called and spoke with patient, he stated that he could not schedule an appointment at this time because he is trying to get in with a SNF and he will need to figure out transportation and money. He will call back to make an appointment. Will forward to BI as an [place].

## 2018-08-31 NOTE — Progress Notes (Signed)
Patient Demographics:    [name], is a 72 y.o. male, DOB - 1945/09/14, VXB:[id number]  Admit date - 08/17/2018   Admitting Physician No admitting provider for patient encounter.  Outpatient Primary MD for the patient is [name], MD  LOS - 14   Chief Complaint  Patient presents with  . Abnormal Lab        Subjective:   No specific complaints.  Breathing has improved.   Assessment  & Plan :    Principal Problem:   Atrial fibrillation with RVR (HCC) Active Problems:   Left hip postoperative wound infection   SIRS (systemic inflammatory response syndrome) (HCC)   Anemia of chronic disease   Depression   OSA (obstructive sleep apnea)   Obesity, Class III, BMI 40-49.9 (morbid obesity) (HCC)   Hypertensive urgency   Pleural effusion on left   Acute on chronic diastolic (congestive) heart failure (HCC)   OSA on CPAP  Brief Summary 72 y.o. male, with history of hypertension, A. 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 fib with RVR and also found to have right-sided pleural effusion, IR removed 830 mL of clear fluid, awaiting insurance approval for transfer back to SNF rehab   Assessment and plan:  1) Chronic Atrial Fibrillation with RVR improved rate control, continue Cardizem CD 180 mg daily, and metoprolol  25 mg bid,  last known EF was 50 to 55% , continue Eliquis for anticoagulation rate control is occasionally difficult.  Mostly controlled ventricular rate.  Stable.  Discontinued telemetry 12/29.  2)HFpEF with right-sided pleural effusion patient with history of chronic diastolic dysfunction CHF suspect exacerbation secondary to A. fib with RVR, Right pleural effusion improved post  thoracentesis on 08/17/2018 with removal of 830 mL of clear fluid (fluid Gram stain and culture negative, final report), fluid LDH 470 serum LDH 146, exudative effusion by Light's criteria,  PCCM appreciated, continue Lasix 40 mg twice daily, daily weights and strict I's and O's, supplemental oxygen as needed Repeat thoracentesis (1.4 L by IR) on 08/26/2018- is still concerning for exudative effusion.  Cytology negative.  Right pleural fluid culture 12/26: Negative to date.  As per pulmonology follow-up 12/27, fluid analysis consistent with exudative effusion but fluid was bloody however cytology negative.  Autoimmune work-up negative.  Clinically appears euvolemic.  Improved. Repeated chest x-ray 12/29 and personally reviewed.  Persistent small left effusion.?  Edema versus infection right mid lower lung- received a dose of IV Lasix 40 mg with good effect.  We will continue low-dose Lasix 20 mg orally daily for a few days and then reassess at SNF with chest x-ray.  As per pulmonology follow-up 12/30, recurrent exudative pleural effusion by LDH criteria only with cell analysis that was not consistent with a bacterial infection and negative cytology and hence differential diagnosis includes pseudo-exudate versus parapneumonic process.  Continue Lasix and follow chest x-ray as needed.  Outpatient follow-up with pulmonology (patient to schedule based on convenience).  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  4)Morbid Obesity with OSA-- c/n CPAP nightly  5)Depression----stable,  Seroquel and Remeron were held due to concerns  about QT prolongation,  continue Lexapro.  EKG 12/29 shows QTC 424 ms.  6)Chronic Anemia----etiology unclear, stool is Hemoccult positive at this time  , patient on Eliquis due to history of A. Fib , hemoglobin is currently 8.2, monitor H&H closely, continue Protonix Iron level still low.  Increased oral iron supplementation as well as continue B12. B12 is relatively low.  Replaced with subcu injection x1.  Hemoglobin stable in the 8 g range for several days.  Follow CBCs periodically.  7)Generalized Weakness/Debility--- PT eval appreciated, recommends SNF rehab awaiting insurance approval for SNF Rehab discharge ,  fatigue persist, difficulties with mobility related activities of daily living persist,, dyspnea on exertion with minimal activity persist  Nausea and vomiting: Unclear etiology.  No acute abdominal findings.  Suspect self-limiting.  Diet as tolerated and monitor closely.  Resolved.   Disposition/Need for in-Hospital Stay- patient unable to be discharged at this time due to awaiting insurance approval for SNF rehab transfer.  Social work input appreciated, patient has now selected an SNF and insurance authorization is in process.  Code Status : Full  Disposition Plan  : SNF Rehab  DVT Prophylaxis  : Eliquis  Inpatient Medications  Scheduled Meds: . apixaban  5 mg Oral BID  . cholecalciferol  2,000 Units Oral QPM  . cycloSPORINE  1 drop Both Eyes BID  . diltiazem  180 mg Oral Daily  . escitalopram  10 mg Oral Daily  . ferrous sulfate  325 mg Oral TID WC  . furosemide  20 mg Oral Daily  . gabapentin  100 mg Oral TID  . Melatonin  6 mg Oral QHS  . metoprolol tartrate  25 mg Oral BID  . metroNIDAZOLE  500 mg Oral BID  . multivitamin with minerals  1 tablet Oral QPM  . pantoprazole  40 mg Oral Daily  . potassium chloride  20 mEq Oral Daily  . rOPINIRole  0.25 mg Oral QHS  . senna-docusate  2 tablet Oral BID  . sodium chloride flush  10-40 mL Intracatheter Q12H  .  thiamine  100 mg Oral Daily   Continuous Infusions:  PRN Meds:.acetaminophen **OR** acetaminophen, calcium carbonate, hydrOXYzine, ipratropium-albuterol, menthol-cetylpyridinium, ondansetron **OR** ondansetron (ZOFRAN) IV, oxyCODONE, phenol, polyethylene glycol, polyvinyl alcohol, promethazine, sodium chloride flush    Anti-infectives (From admission, onward)   Start     Dose/Rate Route Frequency Ordered Stop   08/22/18 1000  metroNIDAZOLE (FLAGYL) tablet 500 mg     500 mg Oral 2 times daily 08/21/18 1511 11/20/18 0959   08/21/18 1515  metroNIDAZOLE (FLAGYL) tablet 500 mg  Status:  Discontinued     500 mg Oral 2 times daily 08/21/18 1510 08/21/18 1511   08/17/18 1730  ertapenem (INVANZ) 1,000 mg in sodium chloride 0.9 % 100 mL IVPB  Status:  Discontinued     1 g 200 mL/hr over 30 Minutes Intravenous Every 24 hours 08/17/18 1720 08/17/18 1728   08/17/18 1200  ertapenem (INVANZ) 1,000 mg in sodium chloride 0.9 % 100 mL IVPB     1 g 200 mL/hr over 30 Minutes Intravenous Every 24 hours 08/17/18 1036 08/21/18 1308  Objective:   Vitals:   08/30/18 2015 08/31/18 0509 08/31/18 0512 08/31/18 1337  BP: 106/79 131/78  102/63  Pulse: 92 82  88  Resp: _0 Temp: 99.1 F (37.3 C) 97.9 F (36.6 C)  98 F (36.7 C)  TempSrc: Oral Oral  Oral  SpO2: 95% 98%    Weight:   (!) 143 kg   Height:        Wt Readings from Last 3 Encounters:  08/31/18 (!) 143 kg  04/24/18 (!) 163.6 kg  03/17/18 (!) 155.6 kg     Intake/Output Summary (Last 24 hours) at 08/31/2018 1747 Last data filed at 08/31/2018 1345 Gross per 24 hour  Intake 260 ml  Output 850 ml  Net -590 ml     Physical Exam: There has been no significant change in clinical exam today compared to yesterday. Gen:-Pleasant elderly male, moderately built and nourished lying comfortably propped up in bed.  Appears in good spirits today. Lungs-improved breath sounds.  Slightly diminished in the bases with occasional basal  crackles but otherwise clear to auscultation.  No increased work of breathing. CV-S1 and S2 heard, irregularly irregular.  No JVD.  Trace bilateral ankle edema.  No murmurs.   abd-nondistended, soft and nontender.  No organomegaly or masses appreciated.  Normal bowel sounds heard.  Stable. Extremity/Skin:-  pedal pulses present.  Chronic patchy bilateral leg discoloration/hyperpigmentation. Psych-affect is flat. Neuro-alert and oriented x3.  No focal neurological deficits. MSK-left hip wound appears to have healed nicely  Data Review:   Micro Results Recent Results (from the past 240 hour(s))  Culture, body fluid-bottle     Status: None   Collection Time: 08/26/18 12:52 PM  Result Value Ref Range Status   Specimen Description FLUID PLEURAL RIGHT  Final   Special Requests BOTTLES DRAWN AEROBIC AND ANAEROBIC  Final   Culture   Final    NO GROWTH 5 DAYS Performed at [facility], [place] [address], [place], [place] [address]    Report Status 08/31/2018 FINAL  Final  Gram stain     Status: None   Collection Time: 08/26/18 12:52 PM  Result Value Ref Range Status   Specimen Description FLUID PLEURAL RIGHT  Final   Special Requests NONE  Final   Gram Stain   Final    CYTOSPIN SMEAR WBC PRESENT,BOTH PMN AND MONONUCLEAR NO ORGANISMS SEEN Performed at [facility], [place] [address] [place], [place] [address]    Report Status 08/26/2018 FINAL  Final    Radiology Reports Dg Chest 1 View  Result Date: 08/26/2018 CLINICAL DATA:  Status post right thoracentesis today. EXAM: CHEST  1 VIEW COMPARISON:  Single-view of the chest 12/22 scratch the single view of the chest earlier today. FINDINGS: Right pleural effusion is markedly decreased after thoracentesis. No pneumothorax. Left effusion and airspace disease persist. There is cardiomegaly and vascular congestion. IMPRESSION: Marked decrease in right pleural effusion after thoracentesis. Negative for pneumothorax. No change in a left  pleural effusion and airspace disease. Cardiomegaly and vascular congestion. Electronically Signed   By: Inge Rise M.D.   On: 08/26/2018 13:01   Dg Chest 1 View  Result Date: 08/17/2018 CLINICAL DATA:  Right-sided thoracentesis EXAM: CHEST  1 VIEW COMPARISON:  Portable chest x-ray of 08/17/2018 FINDINGS: A right thoracentesis has been performed. No pneumothorax is seen. Mild volume loss at the left lung base remains with small left effusion. Cardiomegaly is stable. Left PICC line tip is seen to  the mid lower SVC. IMPRESSION: 1. No pneumothorax after right thoracentesis. 2. Little change in opacity at the left lung base consistent with atelectasis and left effusion. Electronically Signed   By: Ivar Drape M.D.   On: 08/17/2018 16:23   Dg Chest 2 View  Result Date: 08/29/2018 CLINICAL DATA:  Right pleural effusion.  Cough EXAM: CHEST - 2 VIEW COMPARISON:  Chest radiograph 08/26/2018 FINDINGS: Monitoring leads overlie the patient. Stable cardiomegaly. Left upper extremity PICC line tip projects over the superior vena cava. Persistent moderate left pleural effusion with underlying opacities. Interval development of heterogeneous opacities within the right mid and lower lung. IMPRESSION: 1. Persistent small left effusion with heterogeneous opacities left mid lower lung. 2. New heterogeneous opacities right mid lower lung which may represent edema or infection. Electronically Signed   By: Lovey Newcomer M.D.   On: 08/29/2018 12:03   Ct Angio Chest Pe W And/or Wo Contrast  Result Date: 08/17/2018 CLINICAL DATA:  Low hemoglobin with pneumonia. Dyspnea and fever. EXAM: CT ANGIOGRAPHY CHEST WITH CONTRAST TECHNIQUE: Multidetector CT imaging of the chest was performed using the standard protocol during bolus administration of intravenous contrast. Multiplanar CT image reconstructions and MIPs were obtained to evaluate the vascular anatomy. CONTRAST:  144m ISOVUE-370 IOPAMIDOL (ISOVUE-370) INJECTION 76%  COMPARISON:  08/17/2018 CXR, chest CT 06/17/2018 FINDINGS: Cardiovascular: Conventional branch pattern of the great vessels with minimal atherosclerosis at the origin of the left subclavian. No aortic aneurysm or dissection. Mild aortic atherosclerosis without aneurysm or dissection. Scattered coronary arteriosclerosis. Satisfactory opacification of the pulmonary arteries to the proximal segmental level without pulmonary embolus. Heart size is mildly enlarged without pericardial effusion or thickening. Mediastinum/Nodes: Patent trachea and mainstem bronchi. No thyroid mass. The CT appearance of the esophagus is unremarkable. No axillary, supraclavicular, mediastinal nor hilar adenopathy. Lungs/Pleura: New moderate to large right pleural effusion with trace to small left pleural effusion. Adjacent bibasilar and compressive atelectasis is noted. Atelectasis and/or scarring is also seen in the lingula. Respiratory motion artifact slightly limit assessment of the lung bases. No pneumothorax. No dominant mass. There appear to be scattered calcified pleural plaque along the periphery of the left hemithorax. Upper Abdomen: Cholecystectomy. 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 Increased fecal retention within the included colon. Musculoskeletal: Spondylosis of the dorsal spine. No acute nor aggressive osseous lesions. Review of the MIP images confirms the above findings. IMPRESSION: 1. No acute pulmonary embolus, aortic aneurysm or dissection. 2. New moderate to large right pleural effusion with trace to small left pleural effusion. There is adjacent compressive and subsegmental atelectasis at each lung base and lingula. Aortic Atherosclerosis (ICD10-I70.0). Electronically Signed   By: DAshley RoyaltyM.D.   On: 08/17/2018 14:34   Dg Chest Port 1 View  Result Date: 08/26/2018 CLINICAL DATA:  72year old male status post ultrasound-guided right side thoracentesis on 08/17/2018. EXAM: PORTABLE CHEST 1  VIEW COMPARISON:  08/25/2018 and earlier. FINDINGS: Portable AP semi upright view at 0451 hours. Stable left PICC line. Stable lung volumes and mediastinal contours. Increased veiling opacity at the right lung base since yesterday partially obscuring the right diaphragm. Persistent small left pleural effusion suspected. No pneumothorax. Stable pulmonary vascularity without acute edema. IMPRESSION: 1. Suspect re-accumulating right pleural effusion. Stable small left pleural effusion. 2. No new cardiopulmonary abnormality. Electronically Signed   By: HGenevie AnnM.D.   On: 08/26/2018 07:54   Dg Chest Port 1 View  Result Date: 08/25/2018 CLINICAL DATA:  Pleural effusion. EXAM: PORTABLE CHEST 1 VIEW COMPARISON:  08/17/2018 FINDINGS: 0517 hours. The cardio pericardial silhouette is enlarged. There is pulmonary vascular congestion without overt pulmonary edema. Interval progression of airspace disease at the right base with persistent left base collapse/consolidation. Small bilateral pleural effusions are evident. Telemetry leads overlie the chest. IMPRESSION: Cardiomegaly with bibasilar collapse/consolidation, progressive on the right. Small bilateral pleural effusions. Electronically Signed   By: Misty Stanley M.D.   On: 08/25/2018 07:54   Dg Chest Portable 1 View  Result Date: 08/17/2018 CLINICAL DATA:  Right-sided chest pain EXAM: PORTABLE CHEST 1 VIEW COMPARISON:  CT chest 06/17/2018 FINDINGS: Small left pleural effusion. Bilateral mild interstitial thickening no focal consolidation or pneumothorax. Stable cardiomegaly. Left-sided PICC line with the tip projecting over the SVC. Moderate osteoarthritis of the right shoulder. IMPRESSION: 1. Cardiomegaly with mild pulmonary vascular congestion. Electronically Signed   By: Kathreen Devoid   On: 08/17/2018 11:44   US Thoracentesis Asp Pleural Space W/img Guide  Result Date: 08/26/2018 INDICATION: History of pneumonia with recurrent symptomatic right-sided pleural  effusion. Please perform ultrasound-guided thoracentesis for diagnostic and therapeutic purposes. EXAM: US THORACENTESIS ASP PLEURAL SPACE W/IMG GUIDE COMPARISON:  Right-sided ultrasound-guided thoracentesis-08/17/2018 yielding 830 cc of AC, amber colored fluid. Chest radiograph-earlier same day; 08/17/2018; chest CT-08/17/2018 MEDICATIONS: None. COMPLICATIONS: None immediate. TECHNIQUE: Informed written consent was obtained from the patient after a discussion of the risks, benefits and alternatives to treatment. A timeout was performed prior to the initiation of the procedure. Patient was unable to sit upright on the edge of his hospital bed and as such was positioned left lateral decubitus. Initial ultrasound scanning demonstrates a recurrent moderate sized anechoic right-sided pleural effusion. The lower chest was prepped and draped in the usual sterile fashion. 1% lidocaine was used for local anesthesia. Under direct ultrasound guidance, a 19 gauge, 7-cm, Yueh catheter was introduced. An ultrasound image was saved for documentation purposes. The thoracentesis was performed. The catheter was removed and a dressing was applied. The patient tolerated the procedure well without immediate post procedural complication. The patient was escorted to have an upright chest radiograph. FINDINGS: A total of approximately 1.4 liters of serous, slightly blood tinged fluid was removed. Requested samples were sent to the laboratory. IMPRESSION: Successful ultrasound-guided right sided thoracentesis yielding 1.4 liters of serous, slightly blood tinged pleural fluid. Electronically Signed   By: Sandi Mariscal M.D.   On: 08/26/2018 13:28   US Thoracentesis Asp Pleural Space W/img Guide  Result Date: 08/17/2018 INDICATION: Patient with history of pneumonia, chronic left hip infection, bilateral pleural effusions right greater than left. Request made for diagnostic and therapeutic right thoracentesis. EXAM: ULTRASOUND GUIDED  DIAGNOSTIC AND THERAPEUTIC RIGHT THORACENTESIS MEDICATIONS: None COMPLICATIONS: None immediate. PROCEDURE: An ultrasound guided thoracentesis was thoroughly discussed with the patient and questions answered. The benefits, risks, alternatives and complications were also discussed. The patient understands and wishes to proceed with the procedure. Written consent was obtained. Ultrasound was performed to localize and mark an adequate pocket of fluid in the right chest. The area was then prepped and draped in the normal sterile fashion. 1% Lidocaine was used for local anesthesia. Under ultrasound guidance a 6 Fr Safe-T-Centesis catheter was introduced. Thoracentesis was performed. The catheter was removed and a dressing applied. FINDINGS: A total of approximately 830 cc of hazy, amber fluid was removed. Samples were sent to the laboratory as requested by the clinical team. IMPRESSION: Successful ultrasound guided diagnostic and therapeutic right thoracentesis yielding 830 cc of pleural fluid. Read by: Rowe Robert, PA-C Electronically Signed  By: Jacqulynn Cadet M.D.   On: 08/17/2018 16:18     CBC Recent Labs  Lab 08/25/18 0341 08/26/18 0449 08/29/18 0432  WBC 6.7 6.1 7.0  HGB 8.1* 8.4* 8.4*  HCT 28.3* 29.1* 29.3*  PLT 279 329 301  MCV 93.4 90.9 92.7  MCH 26.7 26.3 26.6  MCHC 28.6* 28.9* 28.7*  RDW 16.3* 16.1* 17.1*  LYMPHSABS  --  1.5  --   MONOABS  --  0.6  --   EOSABS  --  0.4  --   BASOSABS  --  0.0  --     Chemistries  Recent Labs  Lab 08/26/18 0449 08/27/18 0921 08/29/18 0432 08/30/18 0432 08/31/18 0438  NA 142 141 141 142 142  K 4.2 4.1 3.9 3.5 3.9  CL 106 105 105 105 104  CO2 _0 GLUCOSE 99 100* 101* 103* 104*  BUN _1 CREATININE 0.91 0.87 0.94 0.84 0.92  CALCIUM 8.9 8.9 8.6* 9.0 8.6*   ------------------------------------------------------------------------------------------------------------------    Component Value Date/Time   BNP 373.9 (H)  08/17/2018 1106    Vernell Leep, MD, FACP, [facility]. [facility] Pager [phone]  If 7PM-7AM, please contact night-coverage [email or web address] Password [facility] 08/31/2018, 5:47 PM

## 2018-08-31 NOTE — Telephone Encounter (Signed)
Called patient on all numbers provided. Left message to give [place] a call back.

## 2018-08-31 NOTE — Telephone Encounter (Signed)
PCCM: Ok thanks.   Garner Nash, DO [place] [facility] 08/31/2018 10:21 AM

## 2018-08-31 NOTE — Telephone Encounter (Signed)
PCCM:  Please set up appointment with NP for hospital follow up.  Garner Nash, DO [facility] 08/31/2018 8:25 AM

## 2018-09-01 NOTE — Progress Notes (Signed)
Patient Demographics:    [name], is a 73 y.o. male, DOB - 28-Sep-1945, VPX:[id number]  Admit date - 08/17/2018   Admitting Physician No admitting provider for patient encounter.  Outpatient Primary MD for the patient is [name], MD  LOS - 15   Chief Complaint  Patient presents with  . Abnormal Lab        Subjective:   States that he has occasional dyspnea, none now.  No other complaints reported.  As per RN, no acute issues noted.   Assessment  & Plan :    Principal Problem:   Atrial fibrillation with RVR (HCC) Active Problems:   Left hip postoperative wound infection   SIRS (systemic inflammatory response syndrome) (HCC)   Anemia of chronic disease   Depression   OSA (obstructive sleep apnea)   Obesity, Class III, BMI 40-49.9 (morbid obesity) ([place])   Hypertensive urgency   Pleural effusion on left   Acute on chronic diastolic (congestive) heart failure (HCC)   OSA on CPAP  Brief Summary 73 y.o. male, with history of hypertension, A. 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 fib with RVR and also found to have right-sided pleural effusion which has been tapped.  At this time patient is medically stable for discharge to SNF pending insurance approval.   Assessment and plan:  1) Chronic Atrial Fibrillation with RVR improved rate control, continue Cardizem CD 180 mg daily, and metoprolol  25 mg bid,  last known EF was 50 to 55% , continue Eliquis for anticoagulation rate control is occasionally difficult.  Mostly controlled ventricular rate.  Stable.  Discontinued telemetry 12/29.  2)HFpEF with right-sided pleural effusion patient with history of chronic diastolic dysfunction CHF suspect  exacerbation secondary to A. fib with RVR, Right pleural effusion improved post thoracentesis on 08/17/2018 with removal of 830 mL of clear fluid (fluid Gram stain and culture negative, final report), fluid LDH 470 serum LDH 146, exudative effusion by Light's criteria,  Repeat thoracentesis (1.4 L by IR) on 08/26/2018- is still concerning for exudative effusion.  Cytology negative.  Right pleural fluid culture 12/26: Negative.    As per pulmonology follow-up 12/27, fluid analysis consistent with exudative effusion but fluid was bloody however cytology negative.  Autoimmune work-up negative.    As per pulmonology follow-up 12/30, recurrent exudative pleural effusion by LDH criteria only with cell analysis that was not consistent with a bacterial infection and negative cytology and hence differential diagnosis includes pseudo-exudate versus parapneumonic process.  Continue Lasix and follow chest x-ray as needed.  Outpatient follow-up with pulmonology (patient to schedule based on convenience).  Stable.  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  4)Morbid Obesity with OSA-- c/w CPAP nightly  5)Depression----stable,  Seroquel and Remeron were held due to concerns about QT prolongation,  continue Lexapro.  EKG 12/29 shows QTC 424 ms.  6)Chronic Anemia----etiology unclear, stool is Hemoccult positive at this time  , patient on Eliquis due to history of A. Fib , hemoglobin is currently 8.2, monitor H&H closely, continue Protonix Iron level still low.  Increased oral iron supplementation as well as continue B12. B12 is  relatively low.  Replaced with subcu injection x1.  Hemoglobin stable in the 8 g range for several days.  Follow CBCs periodically.  7)Generalized Weakness/Debility--- PT eval appreciated, recommends SNF rehab awaiting insurance approval for SNF Rehab discharge ,  fatigue persist, difficulties with mobility related activities of daily living persist,, dyspnea on exertion with minimal activity persist  Nausea and vomiting: Unclear etiology.  No acute abdominal findings.  Suspect self-limiting.  Diet as tolerated and monitor closely.  Resolved.   Disposition/Need for in-Hospital Stay- patient unable to be discharged at this time due to awaiting insurance approval for SNF rehab transfer.  Social work input appreciated, patient has now selected an SNF and insurance authorization is in process.  Code Status : Full  Disposition Plan  : SNF Rehab  DVT Prophylaxis  : Eliquis  Inpatient Medications  Scheduled Meds: . apixaban  5 mg Oral BID  . cholecalciferol  2,000 Units Oral QPM  . cycloSPORINE  1 drop Both Eyes BID  . diltiazem  180 mg Oral Daily  . escitalopram  10 mg Oral Daily  . ferrous sulfate  325 mg Oral TID WC  . furosemide  20 mg Oral Daily  . gabapentin  100 mg Oral TID  . Melatonin  6 mg Oral QHS  . metoprolol tartrate  25 mg Oral BID  . metroNIDAZOLE  500 mg Oral BID  . multivitamin with minerals  1 tablet Oral QPM  . pantoprazole  40 mg Oral Daily  . potassium chloride  20 mEq Oral Daily  . rOPINIRole  0.25 mg Oral QHS  . senna-docusate  2 tablet Oral BID  . sodium chloride flush  10-40 mL Intracatheter Q12H  . thiamine  100 mg Oral Daily   Continuous Infusions:  PRN Meds:.acetaminophen **OR** acetaminophen, calcium carbonate, hydrOXYzine, ipratropium-albuterol, menthol-cetylpyridinium, ondansetron **OR** ondansetron (ZOFRAN) IV, oxyCODONE, phenol, polyethylene glycol, polyvinyl alcohol, promethazine, sodium chloride flush    Anti-infectives (From admission, onward)     Start     Dose/Rate Route Frequency Ordered Stop   08/22/18 1000  metroNIDAZOLE (FLAGYL) tablet 500 mg     500 mg Oral 2 times daily 08/21/18 1511 11/20/18 0959   08/21/18 1515  metroNIDAZOLE (FLAGYL) tablet 500 mg  Status:  Discontinued     500 mg Oral 2 times daily 08/21/18 1510 08/21/18 1511   08/17/18 1730  ertapenem (INVANZ) 1,000 mg in sodium chloride 0.9 % 100 mL IVPB  Status:  Discontinued     1 g 200 mL/hr over 30 Minutes Intravenous Every 24 hours 08/17/18 1720 08/17/18 1728   08/17/18 1200  ertapenem (INVANZ) 1,000 mg in sodium chloride 0.9 % 100 mL IVPB     1 g 200 mL/hr over 30 Minutes Intravenous Every 24 hours 08/17/18 1036 08/21/18 1308        Objective:   Vitals:   08/31/18 1337 08/31/18 2104 09/01/18 0529 09/01/18 1414  BP: 102/63 111/68 134/82 100/68  Pulse: 88 (!) 102 95 89  Resp: 14 16 16 20   Temp: 98 F (36.7 C) 99 F (37.2 C) 98.6 F (37 C)  98.6 F (37 C)  TempSrc: Oral Oral Oral Oral  SpO2:  92% 90% 92%  Weight:   (!) 142.3 kg   Height:        Wt Readings from Last 3 Encounters:  09/01/18 (!) 142.3 kg  04/24/18 (!) 163.6 kg  03/17/18 (!) 155.6 kg     Intake/Output Summary (Last 24 hours) at 09/01/2018 1503 Last data filed at 09/01/2018 1433 Gross per 24 hour  Intake 1060 ml  Output 1550 ml  Net -490 ml     Physical Exam: There has been no significant change in clinical exam over the last couple days. Gen:-Pleasant elderly male, moderately built and nourished lying comfortably propped up in bed.  Appears in good spirits today. Lungs-improved breath sounds.  Slightly diminished in the bases with occasional basal crackles but otherwise clear to auscultation.  No increased work of breathing. CV-S1 and S2 heard, irregularly irregular.  No JVD.  Trace bilateral ankle edema.  No murmurs.   abd-nondistended, soft and nontender.  No organomegaly or masses appreciated.  Normal bowel sounds heard.  Stable. Extremity/Skin:-  pedal pulses present.   Chronic patchy bilateral leg discoloration/hyperpigmentation. Psych-affect is flat. Neuro-alert and oriented x3.  No focal neurological deficits. MSK-left hip wound appears to have healed nicely  Data Review:   Micro Results Recent Results (from the past 240 hour(s))  Culture, body fluid-bottle     Status: None   Collection Time: 08/26/18 12:52 PM  Result Value Ref Range Status   Specimen Description FLUID PLEURAL RIGHT  Final   Special Requests BOTTLES DRAWN AEROBIC AND ANAEROBIC  Final   Culture   Final    NO GROWTH 5 DAYS Performed at [facility], [place] [address] [place], [place] [address]    Report Status 08/31/2018 FINAL  Final  Gram stain     Status: None   Collection Time: 08/26/18 12:52 PM  Result Value Ref Range Status   Specimen Description FLUID PLEURAL RIGHT  Final   Special Requests NONE  Final   Gram Stain   Final    CYTOSPIN SMEAR WBC PRESENT,BOTH PMN AND MONONUCLEAR NO ORGANISMS SEEN Performed at [facility], [place] [address] [place], [place] [address]    Report Status 08/26/2018 FINAL  Final    Radiology Reports Dg Chest 1 View  Result Date: 08/26/2018 CLINICAL DATA:  Status post right thoracentesis today. EXAM: CHEST  1 VIEW COMPARISON:  Single-view of the chest 12/22 scratch the single view of the chest earlier today. FINDINGS: Right pleural effusion is markedly decreased after thoracentesis. No pneumothorax. Left effusion and airspace disease persist. There is cardiomegaly and vascular congestion. IMPRESSION: Marked decrease in right pleural effusion after thoracentesis. Negative for pneumothorax. No change in a left pleural effusion and airspace disease. Cardiomegaly and vascular congestion. Electronically Signed   By: Inge Rise M.D.   On: 08/26/2018 13:01   Dg Chest 1 View  Result Date: 08/17/2018 CLINICAL DATA:  Right-sided thoracentesis EXAM: CHEST  1 VIEW COMPARISON:  Portable chest x-ray of 08/17/2018 FINDINGS: A right  thoracentesis has been performed. No pneumothorax is seen. Mild volume loss at the left lung base remains with small left effusion. Cardiomegaly is stable. Left PICC line tip is seen to the mid lower SVC. IMPRESSION: 1. No pneumothorax after right thoracentesis. 2. Little change in opacity at the left lung base consistent with atelectasis and left effusion. Electronically Signed   By: Ivar Drape M.D.   On: 08/17/2018 16:23  Dg Chest 2 View  Result Date: 08/29/2018 CLINICAL DATA:  Right pleural effusion.  Cough EXAM: CHEST - 2 VIEW COMPARISON:  Chest radiograph 08/26/2018 FINDINGS: Monitoring leads overlie the patient. Stable cardiomegaly. Left upper extremity PICC line tip projects over the superior vena cava. Persistent moderate left pleural effusion with underlying opacities. Interval development of heterogeneous opacities within the right mid and lower lung. IMPRESSION: 1. Persistent small left effusion with heterogeneous opacities left mid lower lung. 2. New heterogeneous opacities right mid lower lung which may represent edema or infection. Electronically Signed   By: Lovey Newcomer M.D.   On: 08/29/2018 12:03   Ct Angio Chest Pe W And/or Wo Contrast  Result Date: 08/17/2018 CLINICAL DATA:  Low hemoglobin with pneumonia. Dyspnea and fever. EXAM: CT ANGIOGRAPHY CHEST WITH CONTRAST TECHNIQUE: Multidetector CT imaging of the chest was performed using the standard protocol during bolus administration of intravenous contrast. Multiplanar CT image reconstructions and MIPs were obtained to evaluate the vascular anatomy. CONTRAST:  151m ISOVUE-370 IOPAMIDOL (ISOVUE-370) INJECTION 76% COMPARISON:  08/17/2018 CXR, chest CT 06/17/2018 FINDINGS: Cardiovascular: Conventional branch pattern of the great vessels with minimal atherosclerosis at the origin of the left subclavian. No aortic aneurysm or dissection. Mild aortic atherosclerosis without aneurysm or dissection. Scattered coronary arteriosclerosis.  Satisfactory opacification of the pulmonary arteries to the proximal segmental level without pulmonary embolus. Heart size is mildly enlarged without pericardial effusion or thickening. Mediastinum/Nodes: Patent trachea and mainstem bronchi. No thyroid mass. The CT appearance of the esophagus is unremarkable. No axillary, supraclavicular, mediastinal nor hilar adenopathy. Lungs/Pleura: New moderate to large right pleural effusion with trace to small left pleural effusion. Adjacent bibasilar and compressive atelectasis is noted. Atelectasis and/or scarring is also seen in the lingula. Respiratory motion artifact slightly limit assessment of the lung bases. No pneumothorax. No dominant mass. There appear to be scattered calcified pleural plaque along the periphery of the left hemithorax. Upper Abdomen: Cholecystectomy. 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 Increased fecal retention within the included colon. Musculoskeletal: Spondylosis of the dorsal spine. No acute nor aggressive osseous lesions. Review of the MIP images confirms the above findings. IMPRESSION: 1. No acute pulmonary embolus, aortic aneurysm or dissection. 2. New moderate to large right pleural effusion with trace to small left pleural effusion. There is adjacent compressive and subsegmental atelectasis at each lung base and lingula. Aortic Atherosclerosis (ICD10-I70.0). Electronically Signed   By: DAshley RoyaltyM.D.   On: 08/17/2018 14:34   Dg Chest Port 1 View  Result Date: 08/26/2018 CLINICAL DATA:  73year old male status post ultrasound-guided right side thoracentesis on 08/17/2018. EXAM: PORTABLE CHEST 1 VIEW COMPARISON:  08/25/2018 and earlier. FINDINGS: Portable AP semi upright view at 0451 hours. Stable left PICC line. Stable lung volumes and mediastinal contours. Increased veiling opacity at the right lung base since yesterday partially obscuring the right diaphragm. Persistent small left pleural effusion suspected. No  pneumothorax. Stable pulmonary vascularity without acute edema. IMPRESSION: 1. Suspect re-accumulating right pleural effusion. Stable small left pleural effusion. 2. No new cardiopulmonary abnormality. Electronically Signed   By: HGenevie AnnM.D.   On: 08/26/2018 07:54   Dg Chest Port 1 View  Result Date: 08/25/2018 CLINICAL DATA:  Pleural effusion. EXAM: PORTABLE CHEST 1 VIEW COMPARISON:  08/17/2018 FINDINGS: 0517 hours. The cardio pericardial silhouette is enlarged. There is pulmonary vascular congestion without overt pulmonary edema. Interval progression of airspace disease at the right base with persistent left base collapse/consolidation. Small bilateral pleural effusions are evident. Telemetry leads overlie  the chest. IMPRESSION: Cardiomegaly with bibasilar collapse/consolidation, progressive on the right. Small bilateral pleural effusions. Electronically Signed   By: Misty Stanley M.D.   On: 08/25/2018 07:54   Dg Chest Portable 1 View  Result Date: 08/17/2018 CLINICAL DATA:  Right-sided chest pain EXAM: PORTABLE CHEST 1 VIEW COMPARISON:  CT chest 06/17/2018 FINDINGS: Small left pleural effusion. Bilateral mild interstitial thickening no focal consolidation or pneumothorax. Stable cardiomegaly. Left-sided PICC line with the tip projecting over the SVC. Moderate osteoarthritis of the right shoulder. IMPRESSION: 1. Cardiomegaly with mild pulmonary vascular congestion. Electronically Signed   By: Kathreen Devoid   On: 08/17/2018 11:44   US Thoracentesis Asp Pleural Space W/img Guide  Result Date: 08/26/2018 INDICATION: History of pneumonia with recurrent symptomatic right-sided pleural effusion. Please perform ultrasound-guided thoracentesis for diagnostic and therapeutic purposes. EXAM: US THORACENTESIS ASP PLEURAL SPACE W/IMG GUIDE COMPARISON:  Right-sided ultrasound-guided thoracentesis-08/17/2018 yielding 830 cc of AC, amber colored fluid. Chest radiograph-earlier same day; 08/17/2018; chest  CT-08/17/2018 MEDICATIONS: None. COMPLICATIONS: None immediate. TECHNIQUE: Informed written consent was obtained from the patient after a discussion of the risks, benefits and alternatives to treatment. A timeout was performed prior to the initiation of the procedure. Patient was unable to sit upright on the edge of his hospital bed and as such was positioned left lateral decubitus. Initial ultrasound scanning demonstrates a recurrent moderate sized anechoic right-sided pleural effusion. The lower chest was prepped and draped in the usual sterile fashion. 1% lidocaine was used for local anesthesia. Under direct ultrasound guidance, a 19 gauge, 7-cm, Yueh catheter was introduced. An ultrasound image was saved for documentation purposes. The thoracentesis was performed. The catheter was removed and a dressing was applied. The patient tolerated the procedure well without immediate post procedural complication. The patient was escorted to have an upright chest radiograph. FINDINGS: A total of approximately 1.4 liters of serous, slightly blood tinged fluid was removed. Requested samples were sent to the laboratory. IMPRESSION: Successful ultrasound-guided right sided thoracentesis yielding 1.4 liters of serous, slightly blood tinged pleural fluid. Electronically Signed   By: Sandi Mariscal M.D.   On: 08/26/2018 13:28   US Thoracentesis Asp Pleural Space W/img Guide  Result Date: 08/17/2018 INDICATION: Patient with history of pneumonia, chronic left hip infection, bilateral pleural effusions right greater than left. Request made for diagnostic and therapeutic right thoracentesis. EXAM: ULTRASOUND GUIDED DIAGNOSTIC AND THERAPEUTIC RIGHT THORACENTESIS MEDICATIONS: None COMPLICATIONS: None immediate. PROCEDURE: An ultrasound guided thoracentesis was thoroughly discussed with the patient and questions answered. The benefits, risks, alternatives and complications were also discussed. The patient understands and wishes to  proceed with the procedure. Written consent was obtained. Ultrasound was performed to localize and mark an adequate pocket of fluid in the right chest. The area was then prepped and draped in the normal sterile fashion. 1% Lidocaine was used for local anesthesia. Under ultrasound guidance a 6 Fr Safe-T-Centesis catheter was introduced. Thoracentesis was performed. The catheter was removed and a dressing applied. FINDINGS: A total of approximately 830 cc of hazy, amber fluid was removed. Samples were sent to the laboratory as requested by the clinical team. IMPRESSION: Successful ultrasound guided diagnostic and therapeutic right thoracentesis yielding 830 cc of pleural fluid. Read by: Rowe Robert, PA-C Electronically Signed   By: Jacqulynn Cadet M.D.   On: 08/17/2018 16:18     CBC Recent Labs  Lab 08/26/18 0449 08/29/18 0432  WBC 6.1 7.0  HGB 8.4* 8.4*  HCT 29.1* 29.3*  PLT 329 301  MCV 90.9  92.7  MCH 26.3 26.6  MCHC 28.9* 28.7*  RDW 16.1* 17.1*  LYMPHSABS 1.5  --   MONOABS 0.6  --   EOSABS 0.4  --   BASOSABS 0.0  --     Chemistries  Recent Labs  Lab 08/26/18 0449 08/27/18 0921 08/29/18 0432 08/30/18 0432 08/31/18 0438  NA 142 141 141 142 142  K 4.2 4.1 3.9 3.5 3.9  CL 106 105 105 105 104  CO2 29 29 29 29 30   GLUCOSE 99 100* 101* 103* 104*  BUN 18 17 18 16 16   CREATININE 0.91 0.87 0.94 0.84 0.92  CALCIUM 8.9 8.9 8.6* 9.0 8.6*   ------------------------------------------------------------------------------------------------------------------    Component Value Date/Time   BNP 373.9 (H) 08/17/2018 1106    Vernell Leep, MD, FACP, [facility]. [facility] Pager [phone]  If 7PM-7AM, please contact night-coverage [email or web address] Password [email or web address] 09/01/2018, 3:03 PM

## 2018-09-01 NOTE — Progress Notes (Signed)
Physical Therapy Treatment Patient Details Name: [name] MRN: [id number] DOB: [birth year] Today's Date: 09/01/2018    History of Present Illness 73 y.o. male, with history of hypertension, A. fib on Eliquis, giant cell arteritis, OSA on CPAP, left total hip arthroplasty, L hip infection, anxiety, depression, morbid obesity; recent prolonged hospital stay at [facility] with  altered mental status, respiratory distress requiring intubation, was then transferred to [facility],  extubated at [facility] where he had further prolonged stay with delirium, had a fall from bed injuring his left hip with x-ray showing new linear linear peri-prosthetic fracture; admitted to [facility] on 08/17/18 with fatigue and cough, was found to be in afib in ED; underwent thoracentesis 12/17      PT Comments    Pt continues to participate well. He remains motivated to progress and regain his independence. He c/o some "lung pain" on today. O2 sat 94% on RA, dyspnea 2/4 during session. Will continue to follow. He will need SNF for continued rehab.    Follow Up Recommendations  SNF     Equipment Recommendations  None recommended by PT    Recommendations for Other Services       Precautions / Restrictions Precautions Precautions: Fall Restrictions Weight Bearing Restrictions: No Other Position/Activity Restrictions: per pt he is allowed WBAT; periprosthetic was > 2 mos ago per notes, pt was amb at [facility] bed mobility: Needs Assistance Bed Mobility: Supine to Sit     Supine to sit: Mod assist;+2 for physical assistance;+2 for safety/equipment Sit to supine: Mod assist;+2 for physical assistance;+2 for safety/equipment   General bed mobility comments: Assist for trunk and LEs. Increased time. Pt relied heavily on rails and trapeze.   Transfers Overall transfer level: Needs assistance Equipment used: Rolling walker (2 wheeled) Transfers: Sit to/from  Stand Sit to Stand: Mod assist;+2 physical assistance;+2 safety/equipment;From elevated surface Stand pivot transfers: Min assist;+2 physical assistance;+2 safety/equipment       General transfer comment: Assist to rise, stabilize, control descent. VCs safety, hand placement. Increased time. Stand pivot, recliner to bed.   Ambulation/Gait Min assist, +2 safety/equipment Gait Distance (Feet): 15 Feet Assistive device: Rolling walker (2 wheeled) Gait Pattern/deviations: Step-to pattern;Wide base of support;Trunk flexed;Step-through pattern;Decreased stride length     General Gait Details: Assist to steady throughout distance. Slow gait speed. Followed closely with recliner. Pt took 2 brief standing rest breaks during short walk. O2 sat 94% on RA.    Stairs             Wheelchair Mobility    Modified Rankin (Stroke Patients Only)       Balance Overall balance assessment: Needs assistance         Standing balance support: Bilateral upper extremity supported Standing balance-[name]: Poor                              Cognition Arousal/Alertness: Awake/alert Behavior During Therapy: WFL for tasks assessed/performed Overall Cognitive Status: Within Functional Limits for tasks assessed                                        Exercises      General Comments        Pertinent Vitals/Pain Pain Assessment: Faces Faces Pain Scale: Hurts even more Pain Location: back  Pain Descriptors / Indicators: Discomfort;Grimacing Pain Intervention(s): Limited activity within patient's tolerance;Repositioned    Home Living                      Prior Function            PT Goals (current goals can now be found in the care plan section) Progress towards PT goals: Progressing toward goals    Frequency    Min 2X/week      PT Plan Current plan remains appropriate    Co-evaluation              AM-PAC PT "6 Clicks"  Mobility   Outcome Measure  Help needed turning from your back to your side while in a flat bed without using bedrails?: A Lot Help needed moving from lying on your back to sitting on the side of a flat bed without using bedrails?: A Lot Help needed moving to and from a bed to a chair (including a wheelchair)?: A Lot Help needed standing up from a chair using your arms (e.g., wheelchair or bedside chair)?: A Lot Help needed to walk in hospital room?: A Lot Help needed climbing 3-5 steps with a railing? : Total 6 Click Score: 11    End of Session Equipment Utilized During Treatment: Gait belt Activity Tolerance: Patient limited by fatigue(limited by dyspnea) Patient left: in bed;with call bell/phone within reach   PT Visit Diagnosis: History of falling (Z91.81);Unsteadiness on feet (R26.81);Muscle weakness (generalized) (M62.81);Difficulty in walking, not elsewhere classified (R26.2)     Time: 1601-0932 PT Time Calculation (min) (ACUTE ONLY): 24 min  Charges:  $Gait Training: 8-22 mins $Therapeutic Activity: 8-22 mins                        Weston Anna, PT Acute Rehabilitation Services Pager: [phone] Office: [phone]

## 2018-09-02 MED ORDER — CYCLOSPORINE 0.05 % OP EMUL: 1.0000 [drp] | Freq: Two times a day (BID) | OPHTHALMIC | 0 refills | Status: DC

## 2018-09-02 MED ORDER — SENNOSIDES-DOCUSATE SODIUM 8.6-50 MG PO TABS: 2.0000 | ORAL_TABLET | Freq: Two times a day (BID) | ORAL | 0 refills | Status: DC

## 2018-09-02 MED ORDER — METRONIDAZOLE 500 MG PO TABS: 500.0000 mg | ORAL_TABLET | Freq: Two times a day (BID) | ORAL | 0 refills | Status: DC

## 2018-09-02 MED ORDER — OXYCODONE HCL 5 MG PO TABS: 5.0000 mg | ORAL_TABLET | Freq: Four times a day (QID) | ORAL | 0 refills | Status: DC | PRN

## 2018-09-02 MED ORDER — METOPROLOL TARTRATE 25 MG PO TABS: 25.0000 mg | ORAL_TABLET | Freq: Two times a day (BID) | ORAL | 0 refills | Status: DC

## 2018-09-02 MED ORDER — GABAPENTIN 100 MG PO CAPS: 100.0000 mg | ORAL_CAPSULE | Freq: Three times a day (TID) | ORAL | 0 refills | Status: DC

## 2018-09-02 MED ORDER — ALBUTEROL SULFATE HFA 108 (90 BASE) MCG/ACT IN AERS: 1.0000 | INHALATION_SPRAY | Freq: Four times a day (QID) | RESPIRATORY_TRACT | 0 refills | Status: DC | PRN

## 2018-09-02 MED ORDER — POTASSIUM CHLORIDE CRYS ER 20 MEQ PO TBCR: 20.0000 meq | EXTENDED_RELEASE_TABLET | Freq: Every day | ORAL | 0 refills | Status: DC

## 2018-09-02 MED ORDER — DILTIAZEM HCL ER COATED BEADS 180 MG PO CP24: 180.0000 mg | ORAL_CAPSULE | Freq: Every day | ORAL | 0 refills | Status: DC

## 2018-09-02 MED ORDER — ROPINIROLE HCL 0.25 MG PO TABS: 0.2500 mg | ORAL_TABLET | Freq: Every day | ORAL | 0 refills | Status: DC

## 2018-09-02 MED ORDER — FUROSEMIDE 20 MG PO TABS: 20.0000 mg | ORAL_TABLET | Freq: Every day | ORAL | 0 refills | Status: DC

## 2018-09-02 MED ORDER — HYDROXYZINE HCL 25 MG PO TABS: 25.0000 mg | ORAL_TABLET | Freq: Three times a day (TID) | ORAL | 0 refills | Status: DC | PRN

## 2018-09-02 MED ORDER — FERROUS SULFATE 325 (65 FE) MG PO TABS: 325.0000 mg | ORAL_TABLET | Freq: Three times a day (TID) | ORAL | 0 refills | Status: DC

## 2018-09-02 MED ORDER — PANTOPRAZOLE SODIUM 40 MG PO TBEC: 40.0000 mg | DELAYED_RELEASE_TABLET | Freq: Every day | ORAL | 0 refills | Status: DC

## 2018-09-02 MED ORDER — CARBOXYMETHYLCELLULOSE SODIUM 0.5 % OP SOLN: 1.0000 [drp] | Freq: Every day | OPHTHALMIC | 0 refills | Status: DC | PRN

## 2018-09-02 MED ORDER — APIXABAN 5 MG PO TABS: 5.0000 mg | ORAL_TABLET | Freq: Two times a day (BID) | ORAL | 0 refills | Status: DC

## 2018-09-02 MED ORDER — POLYETHYLENE GLYCOL 3350 17 G PO PACK: 17.0000 g | PACK | Freq: Every day | ORAL | 0 refills | Status: DC | PRN

## 2018-09-02 MED ORDER — ESCITALOPRAM OXALATE 10 MG PO TABS: 10.0000 mg | ORAL_TABLET | Freq: Every day | ORAL | 0 refills | Status: DC

## 2018-09-02 NOTE — Discharge Summary (Signed)
Physician Discharge Summary  [name] GGY:[id number] DOB: 10-01-1945  PCP: Frank Housekeeper, MD  Admit date: 08/17/2018 Discharge date: 09/02/2018  Recommendations for Outpatient Follow-up:  1. Dr. Dione Cooper, PCP in 1 week with repeat labs (CBC & BMP). 2. Frank Quaker, NP/Pulmonology in 2 weeks.  Patient states that he will call for appointment.  Their office will also try to coordinate. 3. Infectious disease at [facility].  Patient aware.  Home Health: RN, PT, OT and nurses aide.  As per case management, he was declined by insurance company for SNF admission. Equipment/Devices: Bedside commode and rolling walker.  Discharge Condition: Improved and stable CODE STATUS: Full Diet recommendation: Heart healthy diet.  Discharge Diagnoses:  Principal Problem:   Atrial fibrillation with RVR ([place]) Active Problems:   Left hip postoperative wound infection   SIRS (systemic inflammatory response syndrome) (HCC)   Anemia of chronic disease   Depression   OSA (obstructive sleep apnea)   Obesity, Class III, BMI 40-49.9 (morbid obesity) ([place])   Hypertensive urgency   Pleural effusion on left   Acute on chronic diastolic (congestive) heart failure (HCC)   OSA on CPAP   Brief Summary: Brief Summary [name],with history of hypertension, A. 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 fib with RVR and also found to have right-sided pleural effusion which has been tapped.  He clinically improved and has been stable for couple of days for discharge to SNF.  However today his insurance declined SNF admission.  He will be discharged home with home health services and has family to provide 24/7 supervision and assist.   Assessment and plan:  1) Chronic Atrial Fibrillation with RVR Controlled  ventricular rate.  Continue Cardizem CD 180 mg daily, and metoprolol  25 mg bid,  last known EF was 50 to 55% , continue Eliquis for anticoagulation.    2)HFpEF with right-sided pleural effusion patient with history of chronic diastolic dysfunction CHF suspect exacerbation secondary to A. fib with RVR, Right pleural effusion improved post thoracentesis on 08/17/2018 with removal of 830 mL of clear fluid (fluid Gram stain and culture negative, final report), fluid LDH 470 serum LDH 146, exudative effusion by Light's criteria,  Repeat thoracentesis (1.4 L by IR) on 08/26/2018- is still concerning for exudative effusion.  Cytology negative.  Right pleural fluid culture 12/26: Negative.    As per pulmonology follow-up 12/27, fluid analysis consistent with exudative effusion but fluid was bloody however cytology negative.  Autoimmune work-up negative.    As per pulmonology follow-up 12/30, recurrent exudative pleural effusion by LDH criteria only with cell analysis that was not consistent with a bacterial infection and negative cytology and hence differential diagnosis includes pseudo-exudate versus parapneumonic process.  Continue Lasix and follow chest x-ray as needed.  Outpatient follow-up with pulmonology (patient to schedule based on convenience).    I discussed with both patient and the pulmonology team regarding need for outpatient follow-up.  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 He was provided 1 month  supply of Flagyl and will need refills as outpatient to complete course.  4)Morbid Obesity with OSA-- c/w CPAP  nightly  5)Depression----stable,  Seroquel and Remeron were discontinued due to concerns about QT prolongation,  continue Lexapro.  EKG 12/29 shows QTC 424 ms.  Follow EKG periodically as outpatient.  6)Chronic Anemia----etiology unclear, stool is Hemoccult positive at this time. Patient on Eliquis due to history of A. Fib. ccontinue Protonix Iron level still low.  Increased oral iron supplementation. B12 is relatively low.  Replaced with subcu injection x1.  Hemoglobin stable in the 8 g range for several days.  Follow CBCs periodically.  7)Generalized Weakness/Debility--- PT eval appreciated, recommends SNF and patient waited several days in the hospital for insurance approval and finally was declined today.  As per case management, home with maximum home health services and family assistance.  Nausea and vomiting: Unclear etiology.  No acute abdominal findings.  Resolved.    Consultations:  Pulmonology  Interventional radiology  Procedures:  As above   Discharge Instructions  Discharge Instructions    (HEART FAILURE PATIENTS) Call MD:  Anytime you have any of the following symptoms: 1) 3 pound weight gain in 24 hours or 5 pounds in 1 week 2) shortness of breath, with or without a dry hacking cough 3) swelling in the hands, feet or stomach 4) if you have to sleep on extra pillows at night in order to breathe.   Complete by:  As directed    Call MD for:  difficulty breathing, headache or visual disturbances   Complete by:  As directed    Call MD for:  extreme fatigue   Complete by:  As directed    Call MD for:  persistant dizziness or light-headedness   Complete by:  As directed    Call MD for:  persistant nausea and vomiting   Complete by:  As directed    Call MD for:  redness, tenderness, or signs of infection (pain, swelling, redness, odor or green/yellow discharge around incision site)   Complete by:  As directed    Call MD for:  severe uncontrolled pain   Complete by:   As directed    Call MD for:  temperature >100.4   Complete by:  As directed    Diet - low sodium heart healthy   Complete by:  As directed    Increase activity slowly   Complete by:  As directed        Medication List    STOP taking these medications   CHLORASEPTIC TOTAL 01-04-09 MG Lozg Generic drug:  DM-Benzocaine-Menthol   diltiazem 120 MG 24 hr capsule Commonly known as:  DILACOR XR   ertapenem 1 g in sodium chloride 0.9 % 100 mL   ipratropium-albuterol 0.5-2.5 (3) MG/3ML Soln Commonly known as:  DUONEB   Lidocaine 4 % Ptch   mirtazapine 7.5 MG tablet Commonly known as:  REMERON   nystatin powder Commonly known as:  MYCOSTATIN/NYSTOP   QUEtiapine 25 MG tablet Commonly known as:  SEROQUEL     TAKE these medications   acetaminophen 325 MG tablet Commonly known as:  TYLENOL Take 2 tablets (650 mg total) by mouth every 6 (six) hours as needed for mild pain, fever or headache.   albuterol 108 (90 Base) MCG/ACT inhaler Commonly known as:  PROVENTIL HFA;VENTOLIN HFA Inhale 1-2 puffs into the lungs every 6 (six) hours as needed for wheezing or shortness of breath.   apixaban 5 MG Tabs tablet Commonly known as:  ELIQUIS Take 1 tablet (5 mg total) by mouth 2 (two) times daily.   calcium carbonate 500 MG chewable tablet Commonly known as:  TUMS - dosed in mg elemental calcium Chew 1,000 mg by mouth 3 (three) times daily as needed for indigestion.   carboxymethylcellulose 0.5 % Soln Commonly known as:  REFRESH TEARS Place 1 drop into both eyes daily as needed (dry eye/irritation).   CENTRUM ADULTS PO Take 1 tablet by mouth every evening.   cycloSPORINE 0.05 % ophthalmic emulsion Commonly known as:  RESTASIS Place 1 drop into both eyes 2 (two) times daily.   diltiazem 180 MG 24 hr capsule Commonly known as:  CARDIZEM CD Take 1 capsule (180 mg total) by mouth daily. Start taking on:  September 03, 2018   escitalopram 10 MG tablet Commonly known as:   LEXAPRO Take 1 tablet (10 mg total) by mouth daily.   ferrous sulfate 325 (65 FE) MG tablet Take 1 tablet (325 mg total) by mouth 3 (three) times daily with meals. What changed:  when to take this   furosemide 20 MG tablet Commonly known as:  LASIX Take 1 tablet (20 mg total) by mouth daily.   gabapentin 100 MG capsule Commonly known as:  NEURONTIN Take 1 capsule (100 mg total) by mouth 3 (three) times daily.   hydrOXYzine 25 MG tablet Commonly known as:  ATARAX/VISTARIL Take 1 tablet (25 mg total) by mouth 3 (three) times daily as needed for itching or anxiety.   Melatonin 3 MG Tabs Take 6 mg by mouth at bedtime.   metoprolol tartrate 25 MG tablet Commonly known as:  LOPRESSOR Take 1 tablet (25 mg total) by mouth 2 (two) times daily.   metroNIDAZOLE 500 MG tablet Commonly known as:  FLAGYL Take 1 tablet (500 mg total) by mouth 2 (two) times daily. For 90 days from 08/22/2018.  Will need refills.   oxyCODONE 5 MG immediate release tablet Commonly known as:  Oxy IR/ROXICODONE Take 1 tablet (5 mg total) by mouth every 6 (six) hours as needed for moderate pain, severe pain or breakthrough pain. What changed:  Another medication with the same name was removed. Continue taking this medication, and follow the directions you see here.   pantoprazole 40 MG tablet Commonly known as:  PROTONIX Take 1 tablet (40 mg total) by mouth daily.   polyethylene glycol packet Commonly known as:  MIRALAX / GLYCOLAX Take 17 g by mouth daily as needed for mild constipation. What changed:    when to take this  reasons to take this   potassium chloride SA 20 MEQ tablet Commonly known as:  K-DUR,KLOR-CON Take 1 tablet (20 mEq total) by mouth daily. Start taking on:  September 03, 2018   rOPINIRole 0.25 MG tablet Commonly known as:  REQUIP Take 1 tablet (0.25 mg total) by mouth at bedtime.   senna-docusate 8.6-50 MG tablet Commonly known as:  Senokot-S Take 2 tablets by mouth 2 (two)  times daily.   thiamine 100 MG tablet Take 100 mg by mouth daily.   Vitamin D3 50 MCG (2000 UT) Tabs Take 2,000 Units by mouth every evening.      Follow-up Information    Frank Housekeeper, MD. Schedule an appointment as soon as possible for a visit in 1 week(s).   Specialty:  Family Medicine Why:  To be seen with repeat labs (CBC & BMP). Contact information: [place] [address] [phone]        Herminio Commons,  MD .   Specialty:  Cardiology Contact information: [place] [address] [phone]        Lauraine Rinne, NP. Schedule an appointment as soon as possible for a visit in 2 week(s).   Specialty:  Pulmonary Disease Why:  Hospital follow-up. Contact information: [place] [address] [phone]        Infectious Disease MD at [facility]. Schedule an appointment as soon as possible for a visit.   Why:  Follow-up with your infectious disease MD at [facility].         Allergies  Allergen Reactions  . Bee Venom Swelling  . Nickel Rash      Procedures/Studies: Dg Chest 1 View  Result Date: 08/26/2018 CLINICAL DATA:  Status post right thoracentesis today. EXAM: CHEST  1 VIEW COMPARISON:  Single-view of the chest 12/22 scratch the single view of the chest earlier today. FINDINGS: Right pleural effusion is markedly decreased after thoracentesis. No pneumothorax. Left effusion and airspace disease persist. There is cardiomegaly and vascular congestion. IMPRESSION: Marked decrease in right pleural effusion after thoracentesis. Negative for pneumothorax. No change in a left pleural effusion and airspace disease. Cardiomegaly and vascular congestion. Electronically Signed   By: Inge Rise M.D.   On: 08/26/2018 13:01   Dg Chest 1 View  Result Date: 08/17/2018 CLINICAL DATA:  Right-sided thoracentesis EXAM: CHEST  1 VIEW COMPARISON:  Portable chest x-ray of 08/17/2018 FINDINGS: A right  thoracentesis has been performed. No pneumothorax is seen. Mild volume loss at the left lung base remains with small left effusion. Cardiomegaly is stable. Left PICC line tip is seen to the mid lower SVC. IMPRESSION: 1. No pneumothorax after right thoracentesis. 2. Little change in opacity at the left lung base consistent with atelectasis and left effusion. Electronically Signed   By: Ivar Drape M.D.   On: 08/17/2018 16:23   Dg Chest 2 View  Result Date: 08/29/2018 CLINICAL DATA:  Right pleural effusion.  Cough EXAM: CHEST - 2 VIEW COMPARISON:  Chest radiograph 08/26/2018 FINDINGS: Monitoring leads overlie the patient. Stable cardiomegaly. Left upper extremity PICC line tip projects over the superior vena cava. Persistent moderate left pleural effusion with underlying opacities. Interval development of heterogeneous opacities within the right mid and lower lung. IMPRESSION: 1. Persistent small left effusion with heterogeneous opacities left mid lower lung. 2. New heterogeneous opacities right mid lower lung which may represent edema or infection. Electronically Signed   By: Lovey Newcomer M.D.   On: 08/29/2018 12:03   Ct Angio Chest Pe W And/or Wo Contrast  Result Date: 08/17/2018 CLINICAL DATA:  Low hemoglobin with pneumonia. Dyspnea and fever. EXAM: CT ANGIOGRAPHY CHEST WITH CONTRAST TECHNIQUE: Multidetector CT imaging of the chest was performed using the standard protocol during bolus administration of intravenous contrast. Multiplanar CT image reconstructions and MIPs were obtained to evaluate the vascular anatomy. CONTRAST:  170m ISOVUE-370 IOPAMIDOL (ISOVUE-370) INJECTION 76% COMPARISON:  08/17/2018 CXR, chest CT 06/17/2018 FINDINGS: Cardiovascular: Conventional branch pattern of the great vessels with minimal atherosclerosis at the origin of the left subclavian. No aortic aneurysm or dissection. Mild aortic atherosclerosis without aneurysm or dissection. Scattered coronary arteriosclerosis.  Satisfactory opacification of the pulmonary arteries to the proximal segmental level without pulmonary embolus. Heart size is mildly enlarged without pericardial effusion or thickening. Mediastinum/Nodes: Patent trachea and mainstem bronchi. No thyroid mass. The CT appearance of the esophagus is unremarkable. No axillary, supraclavicular, mediastinal nor hilar adenopathy. Lungs/Pleura: New moderate  to large right pleural effusion with trace to small left pleural effusion. Adjacent bibasilar and compressive atelectasis is noted. Atelectasis and/or scarring is also seen in the lingula. Respiratory motion artifact slightly limit assessment of the lung bases. No pneumothorax. No dominant mass. There appear to be scattered calcified pleural plaque along the periphery of the left hemithorax. Upper Abdomen: Cholecystectomy. 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 Increased fecal retention within the included colon. Musculoskeletal: Spondylosis of the dorsal spine. No acute nor aggressive osseous lesions. Review of the MIP images confirms the above findings. IMPRESSION: 1. No acute pulmonary embolus, aortic aneurysm or dissection. 2. New moderate to large right pleural effusion with trace to small left pleural effusion. There is adjacent compressive and subsegmental atelectasis at each lung base and lingula. Aortic Atherosclerosis (ICD10-I70.0). Electronically Signed   By: Ashley Royalty M.D.   On: 08/17/2018 14:34   Dg Chest Port 1 View  Result Date: 08/26/2018 CLINICAL DATA:  73 year old [name] status post ultrasound-guided right side thoracentesis on 08/17/2018. EXAM: PORTABLE CHEST 1 VIEW COMPARISON:  08/25/2018 and earlier. FINDINGS: Portable AP semi upright view at 0451 hours. Stable left PICC line. Stable lung volumes and mediastinal contours. Increased veiling opacity at the right lung base since yesterday partially obscuring the right diaphragm. Persistent small left pleural effusion suspected. No  pneumothorax. Stable pulmonary vascularity without acute edema. IMPRESSION: 1. Suspect re-accumulating right pleural effusion. Stable small left pleural effusion. 2. No new cardiopulmonary abnormality. Electronically Signed   By: Genevie Ann M.D.   On: 08/26/2018 07:54   Dg Chest Port 1 View  Result Date: 08/25/2018 CLINICAL DATA:  Pleural effusion. EXAM: PORTABLE CHEST 1 VIEW COMPARISON:  08/17/2018 FINDINGS: 0517 hours. The cardio pericardial silhouette is enlarged. There is pulmonary vascular congestion without overt pulmonary edema. Interval progression of airspace disease at the right base with persistent left base collapse/consolidation. Small bilateral pleural effusions are evident. Telemetry leads overlie the chest. IMPRESSION: Cardiomegaly with bibasilar collapse/consolidation, progressive on the right. Small bilateral pleural effusions. Electronically Signed   By: Misty Stanley M.D.   On: 08/25/2018 07:54   Dg Chest Portable 1 View  Result Date: 08/17/2018 CLINICAL DATA:  Right-sided chest pain EXAM: PORTABLE CHEST 1 VIEW COMPARISON:  CT chest 06/17/2018 FINDINGS: Small left pleural effusion. Bilateral mild interstitial thickening no focal consolidation or pneumothorax. Stable cardiomegaly. Left-sided PICC line with the tip projecting over the SVC. Moderate osteoarthritis of the right shoulder. IMPRESSION: 1. Cardiomegaly with mild pulmonary vascular congestion. Electronically Signed   By: Kathreen Devoid   On: 08/17/2018 11:44   US Thoracentesis Asp Pleural Space W/img Guide  Result Date: 08/26/2018 INDICATION: History of pneumonia with recurrent symptomatic right-sided pleural effusion. Please perform ultrasound-guided thoracentesis for diagnostic and therapeutic purposes. EXAM: US THORACENTESIS ASP PLEURAL SPACE W/IMG GUIDE COMPARISON:  Right-sided ultrasound-guided thoracentesis-08/17/2018 yielding 830 cc of AC, amber colored fluid. Chest radiograph-earlier same day; 08/17/2018; chest  CT-08/17/2018 MEDICATIONS: None. COMPLICATIONS: None immediate. TECHNIQUE: Informed written consent was obtained from the patient after a discussion of the risks, benefits and alternatives to treatment. A timeout was performed prior to the initiation of the procedure. Patient was unable to sit upright on the edge of his hospital bed and as such was positioned left lateral decubitus. Initial ultrasound scanning demonstrates a recurrent moderate sized anechoic right-sided pleural effusion. The lower chest was prepped and draped in the usual sterile fashion. 1% lidocaine was used for local anesthesia. Under direct ultrasound guidance, a 19 gauge, 7-cm, Yueh catheter was introduced.  An ultrasound image was saved for documentation purposes. The thoracentesis was performed. The catheter was removed and a dressing was applied. The patient tolerated the procedure well without immediate post procedural complication. The patient was escorted to have an upright chest radiograph. FINDINGS: A total of approximately 1.4 liters of serous, slightly blood tinged fluid was removed. Requested samples were sent to the laboratory. IMPRESSION: Successful ultrasound-guided right sided thoracentesis yielding 1.4 liters of serous, slightly blood tinged pleural fluid. Electronically Signed   By: Sandi Mariscal M.D.   On: 08/26/2018 13:28   US Thoracentesis Asp Pleural Space W/img Guide  Result Date: 08/17/2018 INDICATION: Patient with history of pneumonia, chronic left hip infection, bilateral pleural effusions right greater than left. Request made for diagnostic and therapeutic right thoracentesis. EXAM: ULTRASOUND GUIDED DIAGNOSTIC AND THERAPEUTIC RIGHT THORACENTESIS MEDICATIONS: None COMPLICATIONS: None immediate. PROCEDURE: An ultrasound guided thoracentesis was thoroughly discussed with the patient and questions answered. The benefits, risks, alternatives and complications were also discussed. The patient understands and wishes to  proceed with the procedure. Written consent was obtained. Ultrasound was performed to localize and mark an adequate pocket of fluid in the right chest. The area was then prepped and draped in the normal sterile fashion. 1% Lidocaine was used for local anesthesia. Under ultrasound guidance a 6 Fr Safe-T-Centesis catheter was introduced. Thoracentesis was performed. The catheter was removed and a dressing applied. FINDINGS: A total of approximately 830 cc of hazy, amber fluid was removed. Samples were sent to the laboratory as requested by the clinical team. IMPRESSION: Successful ultrasound guided diagnostic and therapeutic right thoracentesis yielding 830 cc of pleural fluid. Read by: Rowe Robert, PA-C Electronically Signed   By: Jacqulynn Cadet M.D.   On: 08/17/2018 16:18      Subjective: No new complaints.  Occasional intermittent mild dyspnea which resolved spontaneously.  No cough, chest pain, fever or chills.  Discharge Exam:  Vitals:   09/01/18 1414 09/01/18 2033 09/02/18 0445 09/02/18 1316  BP: 100/68 115/73 (!) 124/97 106/65  Pulse: 89 96 85 82  Resp: 20 18 20 18   Temp: 98.6 F (37 C) 98.5 F (36.9 C) 97.8 F (36.6 C)   TempSrc: Oral Oral Oral   SpO2: 92% 93% 93% 92%  Weight:   (!) 142.7 kg   Height:        Physical Exam:  Gen:-Pleasant elderly [name], moderately built and nourished lying comfortably propped up in bed.  Appears in good spirits today. Lungs-improved breath sounds.  Slightly diminished in the bases with occasional basal crackles but otherwise clear to auscultation.  No increased work of breathing. CV-S1 and S2 heard, irregularly irregular.  No JVD.    No ankle edema.  No murmurs.   abd-nondistended, soft and nontender.  No organomegaly or masses appreciated.  Normal bowel sounds heard.  Stable. Extremity/Skin:-  pedal pulses present.  Chronic patchy bilateral leg discoloration/hyperpigmentation. Psych-affect is flat. Neuro-alert and oriented x3.  No focal  neurological deficits. MSK-left hip wound appears to have healed nicely    The results of significant diagnostics from this hospitalization (including imaging, microbiology, ancillary and laboratory) are listed below for reference.     Microbiology: Recent Results (from the past 240 hour(s))  Culture, body fluid-bottle     Status: None   Collection Time: 08/26/18 12:52 PM  Result Value Ref Range Status   Specimen Description FLUID PLEURAL RIGHT  Final   Special Requests BOTTLES DRAWN AEROBIC AND ANAEROBIC  Final   Culture   Final  NO GROWTH 5 DAYS Performed at [facility], [place] [address], [place], [place] [address]    Report Status 08/31/2018 FINAL  Final  Gram stain     Status: None   Collection Time: 08/26/18 12:52 PM  Result Value Ref Range Status   Specimen Description FLUID PLEURAL RIGHT  Final   Special Requests NONE  Final   Gram Stain   Final    CYTOSPIN SMEAR WBC PRESENT,BOTH PMN AND MONONUCLEAR NO ORGANISMS SEEN Performed at [facility], [place] [address], [place], [place] [address]    Report Status 08/26/2018 FINAL  Final     Labs: CBC: Recent Labs  Lab 08/29/18 0432  WBC 7.0  HGB 8.4*  HCT 29.3*  MCV 92.7  PLT 673   Basic Metabolic Panel: Recent Labs  Lab 08/27/18 0921 08/29/18 0432 08/30/18 0432 08/31/18 0438  NA 141 141 142 142  K 4.1 3.9 3.5 3.9  CL 105 105 105 104  CO2 29 29 29 30   GLUCOSE 100* 101* 103* 104*  BUN 17 18 16 16   CREATININE 0.87 0.94 0.84 0.92  CALCIUM 8.9 8.6* 9.0 8.6*   BNP (last 3 results) Recent Labs    04/16/18 1248 08/17/18 1106  BNP 453.0* 373.9*   Urinalysis    Component Value Date/Time   COLORURINE YELLOW 08/17/2018 [place] 08/17/2018 1035   APPEARANCEUR Clear 01/18/2018 1539   LABSPEC 1.011 08/17/2018 1035   PHURINE 6.0 08/17/2018 1035   GLUCOSEU NEGATIVE 08/17/2018 1035   HGBUR NEGATIVE 08/17/2018 1035   BILIRUBINUR NEGATIVE 08/17/2018 1035   BILIRUBINUR Negative  01/18/2018 [place] 08/17/2018 1035   PROTEINUR NEGATIVE 08/17/2018 1035   UROBILINOGEN 0.2 09/06/2012 0213   NITRITE NEGATIVE 08/17/2018 1035   LEUKOCYTESUR NEGATIVE 08/17/2018 1035   LEUKOCYTESUR Negative 01/18/2018 1539    I tried and was unsuccessful in reaching spouse via phone at time of discharge.  Time coordinating discharge: 50 minutes  SIGNED:  Vernell Leep, MD, FACP, [facility]. [facility] Pager [phone]  If 7PM-7AM, please contact night-coverage [email or web address] Password [facility] 09/02/2018, 3:49 PM

## 2018-09-02 NOTE — Progress Notes (Signed)
IV team RN removed PICC line for discharge, PTAR called for transportation to pt's home, instructed on discharge, follow up, and medication instructions, verbalized understanding, personal belongings with patient

## 2018-09-02 NOTE — Care Management Note (Signed)
Case Management Note  Patient Details  Name: [name] MRN: [id number] Date of Birth: 05-24-1946  Subjective/Objective:                    Action/Plan: Plan to discharge home with wife and [facility] for HH/PT/RN/NA/OT and SW.    Expected Discharge Date:  (week ago per report)               Expected Discharge Plan:  [place]  In-House Referral:  Clinical Social Work  Discharge planning Services  CM Consult  Post Acute Care Choice:  Durable Medical Equipment, Home Health Choice offered to:  Spouse  DME Arranged:  Bedside commode, Walker rolling DME Agency:  [place]:  RN, PT, OT, Nurse's Aide [place] Agency:  [place]  Status of Service:  Completed, signed off  If discussed at [place] of Stay Meetings, dates discussed:    Additional CommentsPurcell Mouton, RN 09/02/2018, 11:08 AM

## 2018-09-02 NOTE — Progress Notes (Signed)
Nutrition Brief Note  Patient identified on the Malnutrition Screening Tool (MST) Report  Patient now eating 100% of meals. Pt with h/o CHF, on Lasix at home. Weight fluctuations are most likely related to fluid shifts.  Wt Readings from Last 15 Encounters:  09/02/18 (!) 142.7 kg  04/24/18 (!) 163.6 kg  03/17/18 (!) 155.6 kg  02/20/18 (!) 156 kg  01/11/18 (!) 152 kg  12/30/17 (!) 156.5 kg  12/28/17 (!) 156.5 kg  11/23/17 (!) 154.7 kg  03/02/15 (!) 154.4 kg  02/22/15 (!) 154.4 kg  12/25/14 (!) 152.9 kg  08/03/14 (!) 145.2 kg  05/31/14 (!) 147 kg  03/31/14 (!) 147 kg  12/06/12 (!) 157.4 kg    Body mass index is 47.83 kg/m. Patient meets criteria for morbid obesity based on current BMI.   Current diet order is Heart Healthy, patient is consuming approximately 100% of meals at this time. Labs and medications reviewed.   No nutrition interventions warranted at this time. If nutrition issues arise, please consult RD.   Clayton Bibles, MS, RD, [place] Dietitian Pager: [phone] After Hours Pager: [phone]

## 2018-09-02 NOTE — Progress Notes (Signed)
Patient insurance denied SNF rehab. Patient does not have any SNF days left to rehab. CSW notified the patient and ex-spouse. Case Manager following for discharge needs home.  CSW signing off.   Kathrin Greathouse, Marlinda Mike, MSW Clinical Social Worker  [phone] 09/02/2018  12:50 PM

## 2018-09-02 NOTE — Discharge Instructions (Addendum)

## 2018-09-03 ENCOUNTER — Encounter (HOSPITAL_COMMUNITY): Payer: Self-pay | Admitting: *Deleted

## 2018-09-03 ENCOUNTER — Emergency Department (HOSPITAL_COMMUNITY): Payer: Medicare Other

## 2018-09-03 ENCOUNTER — Inpatient Hospital Stay (HOSPITAL_COMMUNITY)
Admission: EM | Admit: 2018-09-03 | Discharge: 2018-09-11 | DRG: 187 | Disposition: A | Discharge status: Home Health | Payer: Medicare Other | Attending: Internal Medicine | Admitting: Internal Medicine

## 2018-09-03 DIAGNOSIS — F319 Bipolar disorder, unspecified: Secondary | ICD-10-CM | POA: Diagnosis present

## 2018-09-03 DIAGNOSIS — J949 Pleural condition, unspecified: Secondary | ICD-10-CM

## 2018-09-03 DIAGNOSIS — G8929 Other chronic pain: Secondary | ICD-10-CM | POA: Diagnosis present

## 2018-09-03 DIAGNOSIS — Z6841 Body Mass Index (BMI) 40.0 and over, adult: Secondary | ICD-10-CM

## 2018-09-03 DIAGNOSIS — K219 Gastro-esophageal reflux disease without esophagitis: Secondary | ICD-10-CM | POA: Diagnosis present

## 2018-09-03 DIAGNOSIS — I5033 Acute on chronic diastolic (congestive) heart failure: Secondary | ICD-10-CM | POA: Diagnosis present

## 2018-09-03 DIAGNOSIS — R0602 Shortness of breath: Secondary | ICD-10-CM | POA: Diagnosis not present

## 2018-09-03 DIAGNOSIS — F418 Other specified anxiety disorders: Secondary | ICD-10-CM | POA: Diagnosis present

## 2018-09-03 DIAGNOSIS — M25519 Pain in unspecified shoulder: Secondary | ICD-10-CM

## 2018-09-03 DIAGNOSIS — Z87891 Personal history of nicotine dependence: Secondary | ICD-10-CM

## 2018-09-03 DIAGNOSIS — D539 Nutritional anemia, unspecified: Secondary | ICD-10-CM | POA: Diagnosis present

## 2018-09-03 DIAGNOSIS — M316 Other giant cell arteritis: Secondary | ICD-10-CM | POA: Diagnosis present

## 2018-09-03 DIAGNOSIS — Z599 Problem related to housing and economic circumstances, unspecified: Secondary | ICD-10-CM

## 2018-09-03 DIAGNOSIS — I482 Chronic atrial fibrillation, unspecified: Secondary | ICD-10-CM | POA: Diagnosis present

## 2018-09-03 DIAGNOSIS — D638 Anemia in other chronic diseases classified elsewhere: Secondary | ICD-10-CM | POA: Diagnosis present

## 2018-09-03 DIAGNOSIS — I480 Paroxysmal atrial fibrillation: Secondary | ICD-10-CM | POA: Diagnosis present

## 2018-09-03 DIAGNOSIS — R0600 Dyspnea, unspecified: Secondary | ICD-10-CM

## 2018-09-03 DIAGNOSIS — Y831 Surgical operation with implant of artificial internal device as the cause of abnormal reaction of the patient, or of later complication, without mention of misadventure at the time of the procedure: Secondary | ICD-10-CM | POA: Diagnosis present

## 2018-09-03 DIAGNOSIS — M545 Low back pain: Secondary | ICD-10-CM | POA: Diagnosis present

## 2018-09-03 DIAGNOSIS — G4733 Obstructive sleep apnea (adult) (pediatric): Secondary | ICD-10-CM | POA: Diagnosis present

## 2018-09-03 DIAGNOSIS — D649 Anemia, unspecified: Secondary | ICD-10-CM | POA: Diagnosis present

## 2018-09-03 DIAGNOSIS — J948 Other specified pleural conditions: Principal | ICD-10-CM | POA: Diagnosis present

## 2018-09-03 DIAGNOSIS — R5381 Other malaise: Secondary | ICD-10-CM | POA: Diagnosis present

## 2018-09-03 DIAGNOSIS — J918 Pleural effusion in other conditions classified elsewhere: Secondary | ICD-10-CM | POA: Diagnosis present

## 2018-09-03 DIAGNOSIS — Z7901 Long term (current) use of anticoagulants: Secondary | ICD-10-CM

## 2018-09-03 DIAGNOSIS — I110 Hypertensive heart disease with heart failure: Secondary | ICD-10-CM | POA: Diagnosis present

## 2018-09-03 DIAGNOSIS — T8452XA Infection and inflammatory reaction due to internal left hip prosthesis, initial encounter: Secondary | ICD-10-CM | POA: Diagnosis present

## 2018-09-03 DIAGNOSIS — Z9989 Dependence on other enabling machines and devices: Secondary | ICD-10-CM

## 2018-09-03 DIAGNOSIS — J90 Pleural effusion, not elsewhere classified: Secondary | ICD-10-CM

## 2018-09-03 DIAGNOSIS — F32A Depression, unspecified: Secondary | ICD-10-CM | POA: Diagnosis present

## 2018-09-03 DIAGNOSIS — K7460 Unspecified cirrhosis of liver: Secondary | ICD-10-CM | POA: Diagnosis present

## 2018-09-03 DIAGNOSIS — F329 Major depressive disorder, single episode, unspecified: Secondary | ICD-10-CM | POA: Diagnosis present

## 2018-09-03 DIAGNOSIS — Z9889 Other specified postprocedural states: Secondary | ICD-10-CM

## 2018-09-03 DIAGNOSIS — T8452XD Infection and inflammatory reaction due to internal left hip prosthesis, subsequent encounter: Secondary | ICD-10-CM

## 2018-09-03 DIAGNOSIS — R262 Difficulty in walking, not elsewhere classified: Secondary | ICD-10-CM

## 2018-09-03 DIAGNOSIS — R296 Repeated falls: Secondary | ICD-10-CM | POA: Diagnosis present

## 2018-09-03 DIAGNOSIS — K760 Fatty (change of) liver, not elsewhere classified: Secondary | ICD-10-CM | POA: Diagnosis present

## 2018-09-03 DIAGNOSIS — I4891 Unspecified atrial fibrillation: Secondary | ICD-10-CM | POA: Diagnosis present

## 2018-09-03 DIAGNOSIS — H532 Diplopia: Secondary | ICD-10-CM | POA: Diagnosis present

## 2018-09-03 DIAGNOSIS — I5032 Chronic diastolic (congestive) heart failure: Secondary | ICD-10-CM | POA: Diagnosis present

## 2018-09-03 DIAGNOSIS — M19011 Primary osteoarthritis, right shoulder: Secondary | ICD-10-CM | POA: Diagnosis present

## 2018-09-03 LAB — CBC WITH DIFFERENTIAL/PLATELET
Abs Immature Granulocytes: 0.04 10*3/uL (ref 0.00–0.07)
Basophils Absolute: 0 10*3/uL (ref 0.0–0.1)
Basophils Relative: 1 %
Eosinophils Absolute: 0.1 10*3/uL (ref 0.0–0.5)
Eosinophils Relative: 2 %
HCT: 34.8 % — ABNORMAL LOW (ref 39.0–52.0)
Hemoglobin: 10.2 g/dL — ABNORMAL LOW (ref 13.0–17.0)
Immature Granulocytes: 1 %
Lymphocytes Relative: 15 %
Lymphs Abs: 1.3 10*3/uL (ref 0.7–4.0)
MCH: 26.5 pg (ref 26.0–34.0)
MCHC: 29.3 g/dL — ABNORMAL LOW (ref 30.0–36.0)
MCV: 90.4 fL (ref 80.0–100.0)
Monocytes Absolute: 0.8 10*3/uL (ref 0.1–1.0)
Monocytes Relative: 10 %
Neutro Abs: 6.2 10*3/uL (ref 1.7–7.7)
Neutrophils Relative %: 71 %
Platelets: 315 10*3/uL (ref 150–400)
RBC: 3.85 MIL/uL — ABNORMAL LOW (ref 4.22–5.81)
RDW: 17.4 % — ABNORMAL HIGH (ref 11.5–15.5)
WBC: 8.5 10*3/uL (ref 4.0–10.5)
nRBC: 0 % (ref 0.0–0.2)

## 2018-09-03 LAB — COMPREHENSIVE METABOLIC PANEL
ALT: 9 U/L (ref 0–44)
AST: 17 U/L (ref 15–41)
Albumin: 2.8 g/dL — ABNORMAL LOW (ref 3.5–5.0)
Alkaline Phosphatase: 60 U/L (ref 38–126)
Anion gap: 9 (ref 5–15)
BUN: 19 mg/dL (ref 8–23)
CO2: 27 mmol/L (ref 22–32)
Calcium: 9.5 mg/dL (ref 8.9–10.3)
Chloride: 106 mmol/L (ref 98–111)
Creatinine, Ser: 0.95 mg/dL (ref 0.61–1.24)
GFR calc Af Amer: >60 mL/min (ref >60)
GFR calc non Af Amer: >60 mL/min (ref >60)
Glucose, Bld: 109 mg/dL — ABNORMAL HIGH (ref 70–99)
Potassium: 4.2 mmol/L (ref 3.5–5.1)
Sodium: 142 mmol/L (ref 135–145)
Total Bilirubin: 0.3 mg/dL (ref 0.3–1.2)
Total Protein: 6.3 g/dL — ABNORMAL LOW (ref 6.5–8.1)

## 2018-09-03 LAB — URINALYSIS, ROUTINE W REFLEX MICROSCOPIC
Bacteria, UA: NONE SEEN
Bilirubin Urine: NEGATIVE
Glucose, UA: NEGATIVE mg/dL
Hgb urine dipstick: NEGATIVE
Ketones, ur: NEGATIVE mg/dL
Nitrite: NEGATIVE
Protein, ur: NEGATIVE mg/dL
Specific Gravity, Urine: 1.016 (ref 1.005–1.030)
pH: 7 (ref 5.0–8.0)

## 2018-09-03 LAB — TROPONIN I: Troponin I: <0.03 ng/mL (ref <0.03)

## 2018-09-03 NOTE — ED Notes (Signed)
Pt transported to xray 

## 2018-09-03 NOTE — ED Triage Notes (Signed)
Per EMS, pt fell at home this afternoon. Pt complains of left leg knee pain, which EMS states he had before the fall. Pt has hx of chronic back pain. Pt states he wants to be placed in a rehab facility because he cannot take care of himself. Pt lives at home, states a friend helps him out.   BP 152/97 HR 111 RR 18 O2 88% on RA, 100% on 4L

## 2018-09-03 NOTE — Progress Notes (Signed)
CSW met with pt and walked pt through the timeline of his previous admission up to the point where the CSW's note from 1/2 stated, "Patient insurance denied SNF rehab. Patient does not have any SNF days left to rehab. CSW notified the patient and ex-spouse. Case Manager following for discharge needs home.  CSW signing off".  After realying this to the pt today (1/3) the pt denied his SNF days were used up saying instead, "They've extended it".  CSE asked pt to explain and pt stated his insurance [facility]) "extended my days".  CSW stated the notes do not reflect that, but that the CSW would review the notes again and check with the EDP to see where EDP was with "working the pt up" medically and would return to the pt to speak again.  Of note: The medical floor CSW Kathrin Greathouse LCSW will be back on duty on 1/4 and can reinforce that he notes were correct and pt was denied further authorized insurance.  Pt gave permission for the CSW to contact his ex-wife who was, per the notes, supportive and closely involved in the decision-making processes egarding D/C prior to the pt's leaving [facility] on 09/02/17.  CSW will continue to follow for D/C needs.  [name]. Frank Mccomber, LCSW, LCAS, CSI Clinical Social Worker Ph: [phone]

## 2018-09-03 NOTE — ED Notes (Signed)
Pt given PO fluids.

## 2018-09-03 NOTE — ED Provider Notes (Signed)
Medical screening examination/treatment/procedure(s) were conducted as a shared visit with non-physician practitioner(s) and myself.  I personally evaluated the patient during the encounter.  EKG Interpretation  Date/Time:  Friday September 03 2018 20:29:49 EST Ventricular Rate:  108 PR Interval:    QRS Duration: 84 QT Interval:  328 QTC Calculation: 439 R Axis:   70 Text Interpretation:  Atrial fibrillation with rapid ventricular response Low voltage QRS Septal infarct , age undetermined Abnormal ECG Confirmed by Lacretia Leigh [id number]) on 09/03/2018 8:72:74 PM  72 year old male who presents with falls and more shortness of breath.  Chest x-ray shows reaccumulation of pleural effusion on the right side.  Will admit to the hospital   Lacretia Leigh, MD 09/03/18 2300

## 2018-09-03 NOTE — Progress Notes (Addendum)
CSW spoke to pt's ex-wife who stated that if the pt were D/C'd tonight then the pt's wife CANNOT be at the pt's home until tomorrow.  Pt's wife suggested the CSW call pt's friend Gretta Arab at ph: [phone] and suggested he may be able to remain at the pt's home tonight and that if Mr. Shawn Stall can remain with the pt tonight then the pt's ex-wife can "spell" Mr. Shawn Stall tomorrow and remain with the pt until [place] team arrives to assess and begin services with he pt on 1/4.  CSW met with pt to let the pt know his ex-wife is aware of the pt's return to the ED and asked for verbal permission to call the pt's friend Mr. Hamilton.  Pt provided verbal permission for the CSW to call his friend Gretta Arab.  CSW called Mr. Hamilton and the phone seemed to be turned off and the CSW left a HIPPA-compliant VM and HIPPA-compliant text requesting a return call.  10:11 PM CSW returned to the pt to find out what time [place] was to arrive at his home on 1/4 (as the pt previously had stated) in order to attempt to request of the pt's wife that she meet the pt at home if he is medically cleared so that the pt can have someone with him when he returns home, have family support arranged, then pt can establish [place] when pt returns home while his ex-wife is present.  CSW met with pt again and asked when Home Health was supposed to meet with the pt on 09/04/17 and the pt presented as if he were obsfuscating AEB the pt hesitating for a moment or two before before stating he was on the phone with [place] while in the ambulance and "had told them I was on my way to the hospital so I don't even think they are coming now".  CSW immediately asked when [facility] was originally scheduled to be there and the pt replied, "I don't know".  CSW will continue to follow for D/C needs.  Alphonse Guild. Havanna Groner, LCSW, LCAS, CSI Clinical Social Worker Ph:  [phone]

## 2018-09-03 NOTE — Clinical Social Work Note (Signed)
Clinical Social Work Assessment  Patient Details  Name: [name] MRN: [id number] Date of Birth: Jun 19, 1946  Date of referral:  09/03/18               Reason for consult:  Facility Placement                Permission sought to share information with:    Permission granted to share information::  Yes, Verbal Permission Granted  Name::        Agency::     Relationship::     Contact Information:     Housing/Transportation Living arrangements for the past 2 months:  [place] of Information:  Patient Patient Interpreter Needed:  None Criminal Activity/Legal Involvement Pertinent to Current Situation/Hospitalization:    Significant Relationships:  Spouse Lives with:  Self Do you feel safe going back to the place where you live?  No Need for family participation in patient care:  Yes (Comment)  Care giving concerns:  CSW spoke to EDP who states pt was D/C'd home yesterday with [facility] services.  EDP requests possibility of SNF placement due to a mention of two falls today.  CSW reviewed chart and per the medical floor CSW on 09/02/17, the pt has used up all of his authorized SNF days and will need 30 days to elapse between SNF days before pt is eligible to receive more insurance authorized SNF days.  CSW reviewed chart and notes that on 12/17 the pt was BIB EMS from [facility].  On 12/21 the pt spoke of feeling depressed to the [facility] Chaplain due to family issues and being "depressed because of his estranged family".  Per chart pt was in the [facility] ED on 12/17 after being at [facility] where he had no longer been able to participate in PT, per all used. If this is correct pt will not be eligible for SNF days until the 15th of January.  Per the chart pt desired to return to his SNF, but had used up all of his authorized days that were fully paid for by insurance and was then "in his co-pay days".    Pt was not agreeable at that time (08/19/18) to return if  he had to pay, but eventually on 08/30/18 (during the same admission) pt was agreeable to pay co-pays in order to D/C to a SNF.  An insurance authorization was started but pt was denied insurance paid days at a SNF on 09/02/17 (same admission) and pt was D/C'd home with [facility] on 1/2.  Of note: To reiterate, if pt's SNF days were all used up on 12/17 when the pt D/C'd from [facility], as the notes reflect, the pt will not be eligible for more SNF days until 30 days go by, in this case 1/15 (approx).  Per notes, pt should be aware of this as the pt had multiple talks with the Medical Floor CSW before leaving [facility] on 1/2. CSW met with pt and walked pt through the timeline of his previous admission up to the point where the CSW's note from 1/2 stated, "Patient insurance denied SNF rehab. Patient does not have any SNF days left to rehab. CSW notified the patient and ex-spouse. Case Manager following for discharge needs home.  CSW signing off".  After realying this to the pt today (1/3) the pt denied his SNF days were used up saying instead, "They've extended it".  CSE asked pt to explain and pt stated  his insurance [facility]) "extended my days".  CSW stated the notes do not reflect that, but that the CSW would review the notes again and check with the EDP to see where EDP was with "working the pt up" medically and would return to the pt to speak again.  Of note: The medical floor CSW Kathrin Greathouse LCSW will be back on duty on 1/4 and can reinforce that he notes were correct and pt was denied further authorized insurance.  Pt gave permission for the CSW to contact his ex-wife who was, per the notes, supportive and closely involved in the decision-making processes egarding D/C prior to the pt's leaving [facility] on 09/02/17.  CSW spoke to pt's ex-wife who stated that if the pt were D/C'd tonight then the pt's wife CANNOT be at the pt's home until tomorrow.  Pt's wife suggested the CSW call pt's  friend Gretta Arab at ph: [phone] and suggested he may be able to remain at the pt's home tonight and that if Mr. Shawn Stall can remain with the pt tonight then the pt's ex-wife can "spell" Mr. Shawn Stall tomorrow and remain with the pt until [place] team arrives to assess and begin services with he pt on 1/4.  CSW met with pt to let the pt know his ex-wife is aware of the pt's return to the ED and asked for verbal permission to call the pt's friend Mr. Hamilton.  Pt provided verbal permission for the CSW to call his friend Gretta Arab.  CSW called Mr. Hamilton and the phone seemed to be turned off and the CSW left a HIPPA-compliant VM and HIPPA-compliant text requesting a return call.  10:11 PM CSW returned to the pt to find out what time [place] was to arrive at his home on 1/4 (as the pt previously had stated) in order to attempt to request of the pt's wife that she meet the pt at home if he is medically cleared so that the pt can have someone with him when he returns home, have family support arranged, then pt can establish [place] when pt returns home while his ex-wife is present.  CSW met with pt again and asked when Home Health was supposed to meet with the pt on 09/04/17 and the pt presented as if he were obsfuscating AEB the pt hesitating for a moment or two before before stating he was on the phone with [place] while in the ambulance and "had told them I was on my way to the hospital so I don't even think they are coming now".  CSW immediately asked when [facility] was originally scheduled to be there and the pt replied, "I don't know".  CSW spoke to the pt's wife and asked her if the pt's wife could be present at the pt's home on 1/4 if the pt D/C's.  Pt's wife stated to the CSW, "To be honest with you I believe he has a deep down sense of fear and extreme anxiety due to his fear of being home alone which has caused him to  have physical manifestations as a result of this fear, that are accompanied by actual physical issues" and that "this has happened three times this year".  Pt's wife asked if a "pulmonary functioning test of some sort is occurring to determine where the fluid accumulation is coming from and the is a determination being done to figure out how to fix that?"   CSW stated  CSW would relay those questions to the EDP.  Social Worker assessment / plan:  CSW met with pt and confirmed pt's plan to be discharged to SNFfor rehab at discharge.  CSW provided active listening and validated pt's concerns.   CSW DEPT was not given permission to complete FL-2 and send referrals out to SNF facilities via the hub per pt's request.  Pt has been living independently prior to being admitted to [facility].  Employment status:  Retired Nurse, adult PT Recommendations:  Not assessed at this time Information / Referral to community resources:     Patient/Family's Response to care:  Patient alert and oriented.  Patient agreeable to plan.  Pt's ex-wife supportive and strongly involved in pt.'s care.  Pt.'s ex-wife pleasant and appreciated CSW intervention.     Patient/Family's Understanding of and Emotional Response to Diagnosis, Current Treatment, and Prognosis:  Still assessing   Emotional Assessment Appearance:    Attitude/Demeanor/Rapport:    Affect (typically observed):  Accepting, Adaptable, Afraid/Fearful, Agitated Orientation:  Oriented to Self, Oriented to Place, Oriented to  Time, Oriented to Situation Alcohol / Substance use:    Psych involvement (Current and /or in the community):     Discharge Needs  Concerns to be addressed:  Home Safety Concerns Readmission within the last 30 days:  Yes Current discharge risk:  None Barriers to Discharge:  No Barriers Identified   Claudine Mouton, LCSWA 09/03/2018, 11:44 PM

## 2018-09-03 NOTE — Progress Notes (Signed)
Consult request has been received. CSW attempting to follow up at present time.  CSW spoke to EDP who states pt was D/C'd home yesterday with [facility] services.  EDP requests possibility of SNF placement due to a mention of two falls today.  CSW reviewed chart and per the medical floor CSW on 09/02/17, the pt has used up all of his authorized SNF days and will need 30 days to elapse between SNF days before pt is eligible to receive more insurance authorized SNF days.  CSW reviewed chart and notes that on 12/17 the pt was BIB EMS from [facility].  On 12/21 the pt spoke of feeling depressed to the [facility] Chaplain due to family issues and being "depressed because of his estranged family".  Per chart pt was in the [facility] ED on 12/17 after being at [facility] where he had no longer been able to participate in PT, per all used. If this is correct pt will not be eligible for SNF days until the 15th of January.  Per the chart pt desired to return to his SNF, but had used up all of his authorized days that were fully paid for by insurance and was then "in his co-pay days".    Pt was not agreeable at that time (08/19/18) to return if he had to pay, but eventually on 08/30/18 (during the same admission) pt was agreeable to pay co-pays in order to D/C to a SNF.  An insurance authorization was started but pt was denied insurance paid days at a SNF on 09/02/17 (same admission) and pt was D/C'd home with [facility] on 1/2.  Of note: To reiterate, if pt's SNF days were all used up on 12/17 when the pt D/C'd from [facility], as the notes reflect, the pt will not be eligible for more SNF days until 30 days go by, in this case 1/15 (approx).  Per notes, pt should be aware of this as the pt had multiple talks with the Medical Floor CSW before leaving [facility] on 1/2.  CSW will continue to follow for D/C needs.  Frank Guild. Rayshawn Maney, LCSW, LCAS, CSI Clinical Social Worker Ph:  [phone]

## 2018-09-03 NOTE — Progress Notes (Addendum)
CSW spoke to the pt's wife and asked her if the pt's wife could be present at the pt's home on 1/4 if the pt D/C's.  Pt's wife stated to the CSW, "To be honest with you I believe he has a deep down sense of fear and extreme anxiety due to his fear of being home alone which has caused him to have physical manifestations as a result of this fear, that are accompanied by actual physical issues" and that "this has happened three times this year".  Pt's wife asked if a "pulmonary functioning test of some sort is occurring to determine where the fluid accumulation is coming from and the is a determination being done to figure out how to fix that?"   CSW stated CSW would relay those questions to the EDP.  PLAN:   EDP is still assessing pt now.  If pt is not admitted then the EDP will place a CASE MGT consult with an order for Face-to-Face so that the RN CM can determine whether the pt received his medications and if not, have them sent to a pharmacy where the pt's wife can assist the pt in picking them up (pt's wife wants bubble packs) and then the RN CM can request that [place] (pt's [place]) can meet the pt at his home after the pt D/C's via PTAR and the pt's wife can be asked to be at the pt's home on 09/03/17 when the pt arrives so family support at the home can be determined as being in place.  This way [place] can be established for the pt with his [place] as this was not to be dome until 09/04/17 anyway.  RN CM will need to call [place] as there may still be an appointment in place and the pt is either unaware or is experiencing anxiety and cannot say or is refusing to say when the appt is.  1st shift ED CSW can contact pt's ex-wife in the morning at ph: [phone] to determine when pt can be at the pt's home prior to D/C and the CSW can inform pt's wife when [place] can be at the pt's home.  CSW can consult with Kathrin Greathouse LCSW to confirm pt can or cannot get more  SNF days (are they used up for now?) oe whether another authorization can take place from the medical floor.  RN can attempt to walk the pt to see if pt can ambulate, per EPD's agreement, with this being wise.  If not and pt is not deemed safe to return home and of pt is not admitted, then CSW Asst Director can be consulted for possible LOG as a last resort due to their being a lack of payor.  2nd shift ED CSW will leave handoff for 1st shift ED CSW.  Please reconsult if future social work needs arise.  CSW signing off, as social work intervention is no longer needed.  Alphonse Guild. Imanni Burdine, LCSW, LCAS, CSI Clinical Social Worker Ph: [phone]      \

## 2018-09-03 NOTE — ED Provider Notes (Signed)
[facility] DEPT Provider Note   CSN: [id number] Arrival date & time: 09/03/18  1802     History   Chief Complaint Chief Complaint  Patient presents with  . Fall  . Leg Pain    HPI [name] is a 73 y.o. male.  [name] is a 73 y.o. male with a history of hypertension, atrial fibrillation, GERD, obesity, chronic low back pain and arthritis, who presents to the emergency department via EMS for evaluation of recurrent falls.  Patient reports for the past several months he has been in and out of the hospital and different rehab facilities.  He was discharged from the hospital yesterday and sent home with home health resources put in place, but he reports they have not been set up to come out to the house yet, and he was supposed to have a consultation tomorrow, but reports that since being home he is fallen twice in the past 24 hours.  At first he called EMS to come help him up but was not transported to the hospital but after the second fall he fell landing on his left side and started to have some pain over his left hip and thigh and so was transported for further evaluation.  He denies hitting his head.  He reports he falls because he starts to feel short of breath and lightheaded.  During patient's recent admission he was found to be in atrial fibrillation with RVR and found to have large bilateral pleural effusions which were drained, but he reports since being home his shortness of breath is worsened.  He denies any chest pain.  No fevers or chills, no cough.  He denies any abdominal pain, nausea or vomiting.  Patient reports continued chronic low back pain and pain in his legs making it very difficult for him to walk.  He reports he is unable to walk even short distances and when he attempted to walk at home today he fell.  He denies any new numbness weakness or tingling in his extremities.  And denies any loss of bowel or bladder control.  Per chart  review during recent admission patient was recommended for skilled nursing facility placement but was unable to be placed due to insurance issues.     Past Medical History:  Diagnosis Date  . Anginal pain ([place]) 2006   evaluated by cardio  . Arthritis    knees,feet,shoulders,elbows.hands  . Constipation   . Dyspnea    with exertion   . Dysrhythmia    Atrial Flutter- 2006- corredted itself  . Family history of adverse reaction to anesthesia    mother- with novocaine went into shock  . GERD (gastroesophageal reflux disease)   . Headache   . Hemorrhoids   . History of blood transfusion   . Hypertension   . Insomnia   . Sleep apnea    cpap  . 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 Hypertensive urgency 08/17/2018  . Pleural effusion on left 08/17/2018  . Acute on chronic diastolic (congestive) heart failure ([place]) 08/17/2018  . OSA on CPAP 08/17/2018  . Atrial fibrillation with rapid ventricular response ([place])   . Delirium   . Depression   . OSA (obstructive sleep apnea)   . Obesity, Class III, BMI 40-49.9 (morbid obesity) ([place])   . HCAP (healthcare-associated pneumonia) 04/16/2018  . Atrial fibrillation with RVR ([place]) 04/16/2018  . Morbid obesity  with BMI of 50.0-59.9, adult ([place]) 04/16/2018  . Acute kidney injury ([place]) 04/16/2018  . Visual hallucination 04/16/2018  . SIRS (systemic inflammatory response syndrome) ([place]) 04/16/2018  . Anemia of chronic disease 04/16/2018  . Left hip postoperative wound infection 03/15/2018  . 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 Femur fracture, left ([place]) 02/15/2018  . Anemia 02/15/2018  . Femur fracture ([place]) 02/15/2018  . 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 SOB (shortness of breath) 03/31/2014  . Hypertension   . Hyponatremia 03/18/2012  . 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[place]) 03/17/2012    Past Surgical History:    Procedure Laterality Date  . APPENDECTOMY    . ARTERY BIOPSY Left 12/30/2017   Procedure: BIOPSY TEMPORAL ARTERY;  Surgeon: Fanny Skates, MD;  Location: [facility];  Service: General;  Laterality: Left;  . CHOLECYSTECTOMY    . COLONOSCOPY W/ POLYPECTOMY    . ESOPHAGOGASTRODUODENOSCOPY (EGD) WITH PROPOFOL  07/06/2012   Procedure: ESOPHAGOGASTRODUODENOSCOPY (EGD) WITH PROPOFOL;  Surgeon: Jeryl Columbia, MD;  Location: [facility];  Service: Endoscopy;  Laterality: N/A;  . ESOPHAGOSCOPY    . EYE SURGERY     left eye- muscle repair  . HERNIA REPAIR     ventral hernia  . INCISION AND DRAINAGE HIP Left 03/17/2018   Procedure: IRRIGATION AND DEBRIDEMENT LEFT THIGH WOUND;  Surgeon: Gaynelle Arabian, MD;  Location: [facility];  Service: Orthopedics;  Laterality: Left;  . JOINT REPLACEMENT     bilateral hips.  Right broke and had to be re placed again  . MASS EXCISION N/A 03/02/2015   Procedure: EXCISION ABDOMINAL WALL MASS;  Surgeon: Alphonsa Overall, MD;  Location: [facility];  Service: General;  Laterality: N/A;  . TOTAL HIP REVISION Left 02/17/2018   Procedure: Left total hip arthroplasty revision;  Surgeon: Gaynelle Arabian, MD;  Location: [facility];  Service: Orthopedics;  Laterality: Left;  Marland Kitchen VERTICAL BANDED GASTROPLASTY          Home Medications    Prior to Admission medications   Medication Sig Start Date End Date Taking? Authorizing Provider  acetaminophen (TYLENOL) 325 MG tablet Take 2 tablets (650 mg total) by mouth every 6 (six) hours as needed for mild pain, fever or headache. 04/27/18   Barton Dubois, MD  albuterol (PROVENTIL HFA;VENTOLIN HFA) 108 (90 Base) MCG/ACT inhaler Inhale 1-2 puffs into the lungs every 6 (six) hours as needed for wheezing or shortness of breath. 09/02/18   Hongalgi, Lenis Dickinson, MD  apixaban (ELIQUIS) 5 MG TABS tablet Take 1 tablet (5 mg total) by mouth 2 (two) times daily. 09/02/18   Hongalgi, Lenis Dickinson, MD  calcium carbonate (TUMS - DOSED IN MG ELEMENTAL CALCIUM) 500 MG chewable tablet  Chew 1,000 mg by mouth 3 (three) times daily as needed for indigestion.    [provider]  carboxymethylcellulose (REFRESH TEARS) 0.5 % SOLN Place 1 drop into both eyes daily as needed (dry eye/irritation). 09/02/18   Hongalgi, Lenis Dickinson, MD  Cholecalciferol (VITAMIN D3) 2000 units TABS Take 2,000 Units by mouth every evening.     [provider]  cycloSPORINE (RESTASIS) 0.05 % ophthalmic emulsion Place 1 drop into both eyes 2 (two) times daily. 09/02/18   Hongalgi, Lenis Dickinson, MD  diltiazem (CARDIZEM CD) 180 MG 24 hr capsule Take 1 capsule (180 mg total) by mouth daily. 09/03/18   Hongalgi, Lenis Dickinson, MD  escitalopram (LEXAPRO) 10 MG tablet Take 1 tablet (10 mg total) by mouth daily. 09/02/18  Hongalgi, Lenis Dickinson, MD  ferrous sulfate 325 (65 FE) MG tablet Take 1 tablet (325 mg total) by mouth 3 (three) times daily with meals. 09/02/18   Hongalgi, Lenis Dickinson, MD  furosemide (LASIX) 20 MG tablet Take 1 tablet (20 mg total) by mouth daily. 09/02/18   Hongalgi, Lenis Dickinson, MD  gabapentin (NEURONTIN) 100 MG capsule Take 1 capsule (100 mg total) by mouth 3 (three) times daily. 09/02/18   Hongalgi, Lenis Dickinson, MD  hydrOXYzine (ATARAX/VISTARIL) 25 MG tablet Take 1 tablet (25 mg total) by mouth 3 (three) times daily as needed for itching or anxiety. 09/02/18   Hongalgi, Lenis Dickinson, MD  Melatonin 3 MG TABS Take 6 mg by mouth at bedtime.    [provider]  metoprolol tartrate (LOPRESSOR) 25 MG tablet Take 1 tablet (25 mg total) by mouth 2 (two) times daily. 09/02/18   Hongalgi, Lenis Dickinson, MD  metroNIDAZOLE (FLAGYL) 500 MG tablet Take 1 tablet (500 mg total) by mouth 2 (two) times daily. For 90 days from 08/22/2018.  Will need refills. 09/02/18   Hongalgi, Lenis Dickinson, MD  Multiple Vitamins-Minerals (CENTRUM ADULTS PO) Take 1 tablet by mouth every evening.     [provider]  oxyCODONE (OXY IR/ROXICODONE) 5 MG immediate release tablet Take 1 tablet (5 mg total) by mouth every 6 (six) hours as needed for moderate pain,  severe pain or breakthrough pain. 09/02/18   Hongalgi, Lenis Dickinson, MD  pantoprazole (PROTONIX) 40 MG tablet Take 1 tablet (40 mg total) by mouth daily. 09/02/18   Hongalgi, Lenis Dickinson, MD  polyethylene glycol (MIRALAX / GLYCOLAX) packet Take 17 g by mouth daily as needed for mild constipation. 09/02/18   Hongalgi, Lenis Dickinson, MD  potassium chloride SA (K-DUR,KLOR-CON) 20 MEQ tablet Take 1 tablet (20 mEq total) by mouth daily. 09/03/18   Hongalgi, Lenis Dickinson, MD  rOPINIRole (REQUIP) 0.25 MG tablet Take 1 tablet (0.25 mg total) by mouth at bedtime. 09/02/18   Hongalgi, Lenis Dickinson, MD  senna-docusate (SENOKOT-S) 8.6-50 MG tablet Take 2 tablets by mouth 2 (two) times daily. 09/02/18   Hongalgi, Lenis Dickinson, MD  thiamine 100 MG tablet Take 100 mg by mouth daily. 07/15/18   [provider]    Family History Family History  Problem Relation Age of Onset  . Cancer Mother   . Alcohol abuse Father     Social History Social History   Tobacco Use  . Smoking status: Former Smoker    Packs/day: 2.00    Years: 10.00    Pack years: 20.00    Types: Cigarettes    Start date: 05/16/1966    Last attempt to quit: 03/11/1984    Years since quitting: 34.5  . Smokeless tobacco: Never Used  Substance Use Topics  . Alcohol use: Yes    Alcohol/week: 0.0 standard drinks    Comment: occasionally a couple of times a year   . Drug use: No     Allergies   Bee venom and Nickel   Review of Systems Review of Systems  Constitutional: Negative for chills and fever.  HENT: Negative.   Eyes: Negative for visual disturbance.  Respiratory: Positive for shortness of breath. Negative for cough, chest tightness and wheezing.   Cardiovascular: Negative for chest pain, palpitations and leg swelling.  Gastrointestinal: Negative for abdominal pain, nausea and vomiting.  Genitourinary: Negative for dysuria and frequency.  Musculoskeletal: Positive for arthralgias, back pain and myalgias.  Skin: Negative for color change and rash.  Neurological: Positive for dizziness, weakness (Generalized) and light-headedness. Negative for syncope, numbness and headaches.     Physical Exam Updated Vital Signs BP 140/85 (BP Location: Left Arm)   Pulse 79   Temp 98.2 F (36.8 C) (Oral)   Resp 16   SpO2 99%   Physical Exam Vitals signs and nursing note reviewed.  Constitutional:      General: He is not in acute distress.    Appearance: Normal appearance. He is well-developed. He is not ill-appearing or diaphoretic.     Comments: Patient appears chronically ill but in no acute distress.  HENT:     Head: Normocephalic and atraumatic.     Mouth/Throat:     Mouth: Mucous membranes are moist.     Pharynx: Oropharynx is clear.  Eyes:     General:        Right eye: No discharge.        Left eye: No discharge.     Pupils: Pupils are equal, round, and reactive to light.  Neck:     Musculoskeletal: Neck supple.  Cardiovascular:     Rate and Rhythm: Normal rate. Rhythm irregular.     Pulses: Normal pulses.     Heart sounds: Normal heart sounds. No murmur. No friction rub. No gallop.      Comments: Heart rhythm is irregularly irregular but rate controlled Pulmonary:     Effort: Pulmonary effort is normal. No respiratory distress.     Breath sounds: Normal breath sounds. No wheezing or rales.     Comments: Respirations equal and unlabored, patient able to speak in full sentences, lungs clear with lung sounds decreased in bilateral bases, right more than left Abdominal:     General: Bowel sounds are normal. There is no distension.     Palpations: Abdomen is soft. There is no mass.     Tenderness: There is no abdominal tenderness. There is no guarding.  Musculoskeletal:        General: No deformity.     Comments: Pain across the low back without overlying skin changes or palpable deformity.  Pain worse with movement of bilateral lower extremities.  Bilateral lower extremities warm and well perfused with 2+ DP and TP pulses and  sensation intact, previous surgical scar over the left hip with some tenderness over the left hip and thigh without overlying ecchymosis or palpable deformity.  No palpable deformity over the knees or lower legs.  Skin:    General: Skin is warm and dry.     Capillary Refill: Capillary refill takes less than 2 seconds.  Neurological:     Mental Status: He is alert and oriented to person, place, and time. Mental status is at baseline.     Coordination: Coordination normal.     Comments: Speech is clear, able to follow commands CN III-XII intact Normal strength in upper and lower extremities bilaterally including dorsiflexion and plantar flexion, strong and equal grip strength Sensation normal to light and sharp touch Moves extremities without ataxia, coordination intact  Psychiatric:        Mood and Affect: Mood normal.        Behavior: Behavior normal.      ED Treatments / Results  Labs (all labs ordered are listed, but only abnormal results are displayed) Labs Reviewed  COMPREHENSIVE METABOLIC PANEL - Abnormal; Notable for the following components:      Result Value   Glucose, Bld 109 (*)    Total Protein 6.3 (*)  Albumin 2.8 (*)    All other components within normal limits  CBC WITH DIFFERENTIAL/PLATELET - Abnormal; Notable for the following components:   RBC 3.85 (*)    Hemoglobin 10.2 (*)    HCT 34.8 (*)    MCHC 29.3 (*)    RDW 17.4 (*)    All other components within normal limits  URINALYSIS, ROUTINE W REFLEX MICROSCOPIC - Abnormal; Notable for the following components:   Leukocytes, UA TRACE (*)    All other components within normal limits  TROPONIN I    EKG EKG Interpretation  Date/Time:  Friday September 03 2018 20:29:49 EST Ventricular Rate:  108 PR Interval:    QRS Duration: 84 QT Interval:  328 QTC Calculation: 439 R Axis:   70 Text Interpretation:  Atrial fibrillation with rapid ventricular response Low voltage QRS Septal infarct , age undetermined  Abnormal ECG Confirmed by Lacretia Leigh ([id number]) on 09/03/2018 8:40:07 PM   Radiology Dg Chest 2 View  Result Date: 09/03/2018 CLINICAL DATA:  Per EMS, pt fell at home this afternoon. Pt complains of left leg knee pain, which EMS states he had before the fall. Pt has hx of chronic back pain. EXAM: CHEST - 2 VIEW COMPARISON:  08/29/2018 and older studies. FINDINGS: Cardiac silhouette is mildly enlarged. No mediastinal or hilar masses. There is bilateral lung base opacity, greater on the right. Opacity is consistent with a combination of small effusions and either atelectasis or infiltrate. Right lung base opacity appears increased from the most recent prior exam. There are prominent bronchovascular markings elsewhere. Linear opacities noted in the left mid lung peripherally consistent with scarring. These findings are stable. No pneumothorax. Skeletal structures are demineralized but grossly intact. IMPRESSION: 1. Bilateral lung base opacities, right greater than left, with the right appearing increased compared to the most recent prior chest radiograph, left stable. Lung base opacities are consistent with a combination of pleural fluid and either atelectasis or infection. No convincing pulmonary edema. Electronically Signed   By: Lajean Manes M.D.   On: 09/03/2018 21:05   Ct Head Wo Contrast  Result Date: 09/03/2018 CLINICAL DATA:  Fall. Headache and neck pain. EXAM: CT HEAD WITHOUT CONTRAST CT CERVICAL SPINE WITHOUT CONTRAST TECHNIQUE: Multidetector CT imaging of the head and cervical spine was performed following the standard protocol without intravenous contrast. Multiplanar CT image reconstructions of the cervical spine were also generated. COMPARISON:  07/18/2018 CT head. Cervical spine radiographs from 11/12/2016 FINDINGS: CT HEAD FINDINGS Brain: The brainstem, cerebellum, cerebral peduncles, thalami, basal ganglia, basilar cisterns, and ventricular system appear within normal limits. No intracranial  hemorrhage, mass lesion, or acute CVA. Vascular: Unremarkable Skull: Unremarkable Sinuses/Orbits: Prior right maxillary antrostomy. Other: No supplemental non-categorized findings. CT CERVICAL SPINE FINDINGS Alignment: 2 mm degenerative anterolisthesis at C6-7 and C7-T1. Skull base and vertebrae: Chronic defect in the anterior arch of C1 as shown on image 24/5, along with a chronic nonunited fracture at the junction of the lateral mass of C1 and the left C1 lamina. Small ossicle along the left upper margin of the odontoid on image 20/5, with small erosions of the odontoid. Fused right facet joint at C2-3. Prominent anterior intervertebral spurring with articulating spurs at all levels between C3 and C7. No appreciable acute cervical spine fractures. Soft tissues and spinal canal: Pannus posterior to the odontoid measuring up to 6 mm in thickness. Common carotid atherosclerotic calcification bilaterally. Disc levels: Osseous foraminal narrowing on the right at C3-4 and T2-3; and on the left at C3-4,  C4-5, C7-T1, T1-2, and T2-3. Upper chest: Abnormal right posterior pleural thickening likely from the previously seen pleural effusion, although slightly more lobular. Other: No supplemental non-categorized findings. IMPRESSION: 1. No acute intracranial findings or acute cervical spine findings. 2. 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 3. Erosions of C1 anteriorly and of the odontoid, with pannus posterior to the odontoid. 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 4. Multilevel osseous foraminal stenosis due to spurring in the cervical spine. 5. Right pleural effusion at the lung apex. Electronically Signed   By: Van Clines M.D.   On: 09/03/2018 21:36   Ct Cervical Spine Wo Contrast  Result Date: 09/03/2018 CLINICAL DATA:  Fall. Headache and neck pain. EXAM: CT HEAD WITHOUT CONTRAST CT CERVICAL SPINE WITHOUT CONTRAST TECHNIQUE:  Multidetector CT imaging of the head and cervical spine was performed following the standard protocol without intravenous contrast. Multiplanar CT image reconstructions of the cervical spine were also generated. COMPARISON:  07/18/2018 CT head. Cervical spine radiographs from 11/12/2016 FINDINGS: CT HEAD FINDINGS Brain: The brainstem, cerebellum, cerebral peduncles, thalami, basal ganglia, basilar cisterns, and ventricular system appear within normal limits. No intracranial hemorrhage, mass lesion, or acute CVA. Vascular: Unremarkable Skull: Unremarkable Sinuses/Orbits: Prior right maxillary antrostomy. Other: No supplemental non-categorized findings. CT CERVICAL SPINE FINDINGS Alignment: 2 mm degenerative anterolisthesis at C6-7 and C7-T1. Skull base and vertebrae: Chronic defect in the anterior arch of C1 as shown on image 24/5, along with a chronic nonunited fracture at the junction of the lateral mass of C1 and the left C1 lamina. Small ossicle along the left upper margin of the odontoid on image 20/5, with small erosions of the odontoid. Fused right facet joint at C2-3. Prominent anterior intervertebral spurring with articulating spurs at all levels between C3 and C7. No appreciable acute cervical spine fractures. Soft tissues and spinal canal: Pannus posterior to the odontoid measuring up to 6 mm in thickness. Common carotid atherosclerotic calcification bilaterally. Disc levels: Osseous foraminal narrowing on the right at C3-4 and T2-3; and on the left at C3-4, C4-5, C7-T1, T1-2, and T2-3. Upper chest: Abnormal right posterior pleural thickening likely from the previously seen pleural effusion, although slightly more lobular. Other: No supplemental non-categorized findings. IMPRESSION: 1. No acute intracranial findings or acute cervical spine findings. 2. 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 3. Erosions of C1 anteriorly and of the odontoid, with pannus  posterior to the odontoid. 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 4. Multilevel osseous foraminal stenosis due to spurring in the cervical spine. 5. Right pleural effusion at the lung apex. Electronically Signed   By: Van Clines M.D.   On: 09/03/2018 21:36   Dg Hip Unilat W Or Wo Pelvis 2-3 Views Left  Result Date: 09/03/2018 CLINICAL DATA:  Per EMS, pt fell at home this afternoon. Pt complains of left leg knee pain, which EMS states he had before the fall. Pt has hx of chronic back pain. EXAM: DG HIP (WITH OR WITHOUT PELVIS) 2-3V LEFT COMPARISON:  03/15/2018 FINDINGS: No acute fracture. There are bilateral total hip arthroplasties appear well seated. A fracture of the proximal left femoral shaft has some persistent deformity, but appears healed when compared to the prior exam. Skeletal structures are diffusely demineralized. SI joints and symphysis pubis are normally aligned. IMPRESSION: 1. No acute fracture or dislocation. 2. No evidence of loosening of either  total hip joint arthroplasty. Electronically Signed   By: Lajean Manes M.D.   On: 09/03/2018 21:07   Dg Femur Min 2 Views Left  Result Date: 09/03/2018 CLINICAL DATA:  Per EMS, pt fell at home this afternoon. Pt complains of left leg knee pain, which EMS states he had before the fall. Pt has hx of chronic back pain. EXAM: LEFT FEMUR 2 VIEWS COMPARISON:  03/15/2018 and 01/11/2018. FINDINGS: The previously seen, from 03/15/2018, fracture of the femoral shaft adjacent to the distal aspect the intramedullary stem of the femoral component the prosthesis, appears healed, but with some persistent deformity/angulation. No acute fracture. The left hip total arthroplasty appears well seated with no evidence of loosening. There are advanced degenerative changes of the knee joint. Bones are diffusely demineralized.  Soft tissues are unremarkable. IMPRESSION: 1. No acute fracture or dislocation. Electronically Signed   By: Lajean Manes  M.D.   On: 09/03/2018 21:09    Procedures Procedures (including critical care time)  Medications Ordered in ED Medications - No data to display   Initial Impression / Assessment and Plan / ED Course  I have reviewed the triage vital signs and the nursing notes.  Pertinent labs & imaging results that were available during my care of the patient were reviewed by me and considered in my medical decision making (see chart for details).  Patient presents for evaluation of recurrent falls, shortness of breath and lightheadedness.  Was discharged from hospital yesterday, and since being home has not been able to ambulate independently, has fallen twice, did not hit his head during falls, complaining of pain over the left thigh and hip from fall, no evidence of trauma to the abdomen or chest.  Patient was recently admitted for bilateral pleural effusions, had these drained but now reporting worsening shortness of breath, no oxygen requirement at this time.  Lungs with diminished sounds in bilateral bases, worse on the right than the left.  Denies any chest pain.  Appears neurologically intact on exam, but unable to ambulate here in the department.  Will get basic labs, troponin, EKG and chest x-ray will also get CT head and neck and x-rays of the left femur and hip to evaluate for injury after fall.  Social work consulted to aid in they have disposition.  Labs overall reassuring, negative troponin, EKG shows A. fib with a heart rate of 108 without concerning ischemic changes.  No leukocytosis, hemoglobin improved from recent admission, no acute electrolyte derangements requiring intervention, normal renal and liver function, urinalysis without signs of infection.  Chest x-ray does show interval worsening of bilateral pleural effusions worse on the right than the left, I reviewed these images myself and they do appear increased since patient was discharged yesterday, this could certainly be contributing to  patient's shortness of breath or lightheadedness.  X-rays of the hip and femur show prior surgical changes but no acute fracture or dislocation.  CT of the head and neck show no acute intracranial bleeding, CT of the neck shows a chronic C1 fracture with malunion and 2 level degenerative changes but no acute fracture or malalignment.  Aside from worsening pleural effusions work-up largely reassuring, but patient is unable to walk here in the emergency department, and feels that he is unable to care for himself at home, had home health resources set up but they are not able to be in the home regularly and have not yet been to evaluate the patient.  He reports that he is unable  to care for himself at home, and is estranged from family.  Social work consulted, and it appears from previous documentation that patient has used up his Medicare SNF days and will have to get through another 30-day period prior to being able to return to a SNF or rehab facility.  Given patient's inability to walk and worsening pleural effusions case was discussed with Dr. Zenia Resides who saw and evaluated the patient as well and recommends hospitalist admission with continued efforts for placement, as patient is not safe to go home at this time.  Case was discussed with Dr. Myna Hidalgo with [facility] who will see and evaluate the patient, concern with worsening pleural effusions that patient may ultimately need another thoracentesis, although he is stable and not hypoxic at this time.  If patient does not meet admission criteria he will need to board in the emergency department overnight case management and social work will then continue to work on disposition for the patient.   Final Clinical Impressions(s) / ED Diagnoses   Final diagnoses:  Recurrent falls  Shortness of breath  Pleural effusion, bilateral  Unable to ambulate    ED Discharge Orders    None       Janet Berlin 09/04/18 0040    Lacretia Leigh,  MD 09/04/18 2104

## 2018-09-04 ENCOUNTER — Encounter (HOSPITAL_COMMUNITY): Payer: Self-pay | Admitting: Family Medicine

## 2018-09-04 ENCOUNTER — Observation Stay (HOSPITAL_COMMUNITY): Payer: Medicare Other

## 2018-09-04 ENCOUNTER — Other Ambulatory Visit: Payer: Self-pay

## 2018-09-04 DIAGNOSIS — J90 Pleural effusion, not elsewhere classified: Secondary | ICD-10-CM | POA: Diagnosis not present

## 2018-09-04 DIAGNOSIS — I4891 Unspecified atrial fibrillation: Secondary | ICD-10-CM

## 2018-09-04 DIAGNOSIS — I5032 Chronic diastolic (congestive) heart failure: Secondary | ICD-10-CM

## 2018-09-04 DIAGNOSIS — R5381 Other malaise: Secondary | ICD-10-CM | POA: Diagnosis present

## 2018-09-04 DIAGNOSIS — F329 Major depressive disorder, single episode, unspecified: Secondary | ICD-10-CM

## 2018-09-04 DIAGNOSIS — D649 Anemia, unspecified: Secondary | ICD-10-CM

## 2018-09-04 DIAGNOSIS — T8452XD Infection and inflammatory reaction due to internal left hip prosthesis, subsequent encounter: Secondary | ICD-10-CM

## 2018-09-04 LAB — CBC WITH DIFFERENTIAL/PLATELET
Abs Immature Granulocytes: 0.02 10*3/uL (ref 0.00–0.07)
Basophils Absolute: 0 10*3/uL (ref 0.0–0.1)
Basophils Relative: 0 %
Eosinophils Absolute: 0.3 10*3/uL (ref 0.0–0.5)
Eosinophils Relative: 5 %
HCT: 32.7 % — ABNORMAL LOW (ref 39.0–52.0)
Hemoglobin: 9.7 g/dL — ABNORMAL LOW (ref 13.0–17.0)
Immature Granulocytes: 0 %
Lymphocytes Relative: 18 %
Lymphs Abs: 1.2 10*3/uL (ref 0.7–4.0)
MCH: 26.4 pg (ref 26.0–34.0)
MCHC: 29.7 g/dL — ABNORMAL LOW (ref 30.0–36.0)
MCV: 89.1 fL (ref 80.0–100.0)
Monocytes Absolute: 0.8 10*3/uL (ref 0.1–1.0)
Monocytes Relative: 11 %
Neutro Abs: 4.5 10*3/uL (ref 1.7–7.7)
Neutrophils Relative %: 66 %
Platelets: 291 10*3/uL (ref 150–400)
RBC: 3.67 MIL/uL — ABNORMAL LOW (ref 4.22–5.81)
RDW: 17.6 % — ABNORMAL HIGH (ref 11.5–15.5)
WBC: 6.9 10*3/uL (ref 4.0–10.5)
nRBC: 0 % (ref 0.0–0.2)

## 2018-09-04 LAB — PROTEIN, PLEURAL OR PERITONEAL FLUID: Total protein, fluid: <3 g/dL

## 2018-09-04 LAB — AMYLASE, PLEURAL OR PERITONEAL FLUID: Amylase, Fluid: 17 U/L

## 2018-09-04 LAB — BODY FLUID CELL COUNT WITH DIFFERENTIAL
Eos, Fluid: 14 %
Lymphs, Fluid: 56 %
Monocyte-Macrophage-Serous Fluid: 27 % — ABNORMAL LOW (ref 50–90)
Neutrophil Count, Fluid: 3 % (ref 0–25)
Total Nucleated Cell Count, Fluid: 1424 cu mm — ABNORMAL HIGH (ref 0–1000)

## 2018-09-04 LAB — BASIC METABOLIC PANEL
Anion gap: 9 (ref 5–15)
BUN: 18 mg/dL (ref 8–23)
CO2: 28 mmol/L (ref 22–32)
Calcium: 9.3 mg/dL (ref 8.9–10.3)
Chloride: 106 mmol/L (ref 98–111)
Creatinine, Ser: 0.93 mg/dL (ref 0.61–1.24)
GFR calc Af Amer: >60 mL/min (ref >60)
GFR calc non Af Amer: >60 mL/min (ref >60)
Glucose, Bld: 128 mg/dL — ABNORMAL HIGH (ref 70–99)
Potassium: 3.6 mmol/L (ref 3.5–5.1)
Sodium: 143 mmol/L (ref 135–145)

## 2018-09-04 LAB — LACTATE DEHYDROGENASE, PLEURAL OR PERITONEAL FLUID: LD, Fluid: 134 U/L — ABNORMAL HIGH (ref 3–23)

## 2018-09-04 LAB — LACTATE DEHYDROGENASE: LDH: 139 U/L (ref 98–192)

## 2018-09-04 LAB — ALBUMIN, PLEURAL OR PERITONEAL FLUID: Albumin, Fluid: 1.5 g/dL

## 2018-09-04 LAB — GLUCOSE, PLEURAL OR PERITONEAL FLUID: Glucose, Fluid: 101 mg/dL

## 2018-09-04 MED ORDER — ALBUTEROL SULFATE HFA 108 (90 BASE) MCG/ACT IN AERS: 1.0000 | INHALATION_SPRAY | Freq: Four times a day (QID) | RESPIRATORY_TRACT | Status: DC | PRN

## 2018-09-04 MED ORDER — SODIUM CHLORIDE 0.9% FLUSH: 3.0000 mL | INTRAVENOUS | Status: DC | PRN

## 2018-09-04 MED ORDER — ROPINIROLE HCL 0.25 MG PO TABS
0.2500 mg | ORAL_TABLET | Freq: Every day | ORAL | Status: DC
  Administered 2018-09-04 – 2018-09-10 (×7): 0.25 mg via ORAL
  Filled 2018-09-04 (×8): qty 1

## 2018-09-04 MED ORDER — GABAPENTIN 100 MG PO CAPS
100.0000 mg | ORAL_CAPSULE | Freq: Three times a day (TID) | ORAL | Status: DC
  Administered 2018-09-04 – 2018-09-11 (×23): 100 mg via ORAL
  Filled 2018-09-04 (×23): qty 1

## 2018-09-04 MED ORDER — DILTIAZEM HCL ER COATED BEADS 180 MG PO CP24
180.0000 mg | ORAL_CAPSULE | Freq: Every day | ORAL | Status: DC
  Administered 2018-09-04 – 2018-09-11 (×8): 180 mg via ORAL
  Filled 2018-09-04 (×8): qty 1

## 2018-09-04 MED ORDER — ACETAMINOPHEN 650 MG RE SUPP: 650.0000 mg | Freq: Four times a day (QID) | RECTAL | Status: DC | PRN

## 2018-09-04 MED ORDER — CYCLOSPORINE 0.05 % OP EMUL
1.0000 [drp] | Freq: Two times a day (BID) | OPHTHALMIC | Status: DC
  Administered 2018-09-04 – 2018-09-11 (×15): 1 [drp] via OPHTHALMIC
  Filled 2018-09-04 (×16): qty 1

## 2018-09-04 MED ORDER — SODIUM CHLORIDE 0.9% FLUSH
3.0000 mL | Freq: Two times a day (BID) | INTRAVENOUS | Status: DC
  Administered 2018-09-08 – 2018-09-11 (×4): 3 mL via INTRAVENOUS

## 2018-09-04 MED ORDER — PANTOPRAZOLE SODIUM 40 MG PO TBEC
40.0000 mg | DELAYED_RELEASE_TABLET | Freq: Every day | ORAL | Status: DC
  Administered 2018-09-04 – 2018-09-11 (×8): 40 mg via ORAL
  Filled 2018-09-04 (×8): qty 1

## 2018-09-04 MED ORDER — ZOLPIDEM TARTRATE 5 MG PO TABS
5.0000 mg | ORAL_TABLET | Freq: Every evening | ORAL | Status: DC | PRN
  Administered 2018-09-04 – 2018-09-10 (×7): 5 mg via ORAL
  Filled 2018-09-04 (×7): qty 1

## 2018-09-04 MED ORDER — ACETAMINOPHEN 325 MG PO TABS
650.0000 mg | ORAL_TABLET | Freq: Four times a day (QID) | ORAL | Status: DC | PRN
  Administered 2018-09-04 – 2018-09-11 (×5): 650 mg via ORAL
  Filled 2018-09-04 (×5): qty 2

## 2018-09-04 MED ORDER — MELATONIN 3 MG PO TABS
6.0000 mg | ORAL_TABLET | Freq: Every day | ORAL | Status: DC
  Administered 2018-09-04 – 2018-09-10 (×7): 6 mg via ORAL
  Filled 2018-09-04 (×8): qty 2

## 2018-09-04 MED ORDER — APIXABAN 5 MG PO TABS
5.0000 mg | ORAL_TABLET | Freq: Two times a day (BID) | ORAL | Status: DC
  Administered 2018-09-04 – 2018-09-11 (×15): 5 mg via ORAL
  Filled 2018-09-04 (×15): qty 1

## 2018-09-04 MED ORDER — SENNOSIDES-DOCUSATE SODIUM 8.6-50 MG PO TABS
2.0000 | ORAL_TABLET | Freq: Two times a day (BID) | ORAL | Status: DC
  Administered 2018-09-04 – 2018-09-10 (×9): 2 via ORAL
  Filled 2018-09-04 (×16): qty 2

## 2018-09-04 MED ORDER — POLYVINYL ALCOHOL 1.4 % OP SOLN
1.0000 [drp] | Freq: Every day | OPHTHALMIC | Status: DC | PRN
  Filled 2018-09-04: qty 15

## 2018-09-04 MED ORDER — ENSURE ENLIVE PO LIQD
237.0000 mL | Freq: Two times a day (BID) | ORAL | Status: DC
  Administered 2018-09-04 – 2018-09-11 (×14): 237 mL via ORAL

## 2018-09-04 MED ORDER — FUROSEMIDE 20 MG PO TABS
20.0000 mg | ORAL_TABLET | Freq: Every day | ORAL | Status: DC
  Administered 2018-09-04 – 2018-09-05 (×2): 20 mg via ORAL
  Filled 2018-09-04 (×2): qty 1

## 2018-09-04 MED ORDER — SODIUM CHLORIDE 0.9 % IV SOLN: 250.0000 mL | INTRAVENOUS | Status: DC | PRN

## 2018-09-04 MED ORDER — SODIUM CHLORIDE 0.9% FLUSH
3.0000 mL | Freq: Two times a day (BID) | INTRAVENOUS | Status: DC
  Administered 2018-09-04 – 2018-09-10 (×13): 3 mL via INTRAVENOUS

## 2018-09-04 MED ORDER — HYDROXYZINE HCL 25 MG PO TABS
25.0000 mg | ORAL_TABLET | Freq: Three times a day (TID) | ORAL | Status: DC | PRN
  Administered 2018-09-04 – 2018-09-11 (×15): 25 mg via ORAL
  Filled 2018-09-04 (×15): qty 1

## 2018-09-04 MED ORDER — METOPROLOL TARTRATE 25 MG PO TABS
25.0000 mg | ORAL_TABLET | Freq: Two times a day (BID) | ORAL | Status: DC
  Administered 2018-09-04 – 2018-09-06 (×6): 25 mg via ORAL
  Filled 2018-09-04 (×6): qty 1

## 2018-09-04 MED ORDER — ALBUMIN HUMAN 25 % IV SOLN
50.0000 g | Freq: Once | INTRAVENOUS | Status: AC
  Administered 2018-09-04: 50 g via INTRAVENOUS
  Filled 2018-09-04: qty 200

## 2018-09-04 MED ORDER — ONDANSETRON HCL 4 MG/2ML IJ SOLN
4.0000 mg | Freq: Four times a day (QID) | INTRAMUSCULAR | Status: DC | PRN
  Administered 2018-09-09: 4 mg via INTRAVENOUS
  Filled 2018-09-04 (×2): qty 2

## 2018-09-04 MED ORDER — LIDOCAINE HCL 1 % IJ SOLN
INTRAMUSCULAR | Status: AC
  Filled 2018-09-04: qty 10

## 2018-09-04 MED ORDER — OXYCODONE HCL 5 MG PO TABS
5.0000 mg | ORAL_TABLET | Freq: Four times a day (QID) | ORAL | Status: DC | PRN
  Administered 2018-09-04 – 2018-09-05 (×5): 5 mg via ORAL
  Filled 2018-09-04 (×5): qty 1

## 2018-09-04 MED ORDER — ONDANSETRON HCL 4 MG PO TABS
4.0000 mg | ORAL_TABLET | Freq: Four times a day (QID) | ORAL | Status: DC | PRN
  Administered 2018-09-10: 4 mg via ORAL
  Filled 2018-09-04: qty 1

## 2018-09-04 MED ORDER — POLYETHYLENE GLYCOL 3350 17 G PO PACK: 17.0000 g | PACK | Freq: Every day | ORAL | Status: DC | PRN

## 2018-09-04 MED ORDER — ALBUTEROL SULFATE (2.5 MG/3ML) 0.083% IN NEBU: 2.5000 mg | INHALATION_SOLUTION | Freq: Four times a day (QID) | RESPIRATORY_TRACT | Status: DC | PRN

## 2018-09-04 MED ORDER — METRONIDAZOLE 500 MG PO TABS
500.0000 mg | ORAL_TABLET | Freq: Two times a day (BID) | ORAL | Status: DC
  Administered 2018-09-04 – 2018-09-11 (×16): 500 mg via ORAL
  Filled 2018-09-04 (×16): qty 1

## 2018-09-04 MED ORDER — VITAMIN B-1 100 MG PO TABS
100.0000 mg | ORAL_TABLET | Freq: Every day | ORAL | Status: DC
  Administered 2018-09-04 – 2018-09-11 (×8): 100 mg via ORAL
  Filled 2018-09-04 (×8): qty 1

## 2018-09-04 MED ORDER — ESCITALOPRAM OXALATE 10 MG PO TABS
10.0000 mg | ORAL_TABLET | Freq: Every day | ORAL | Status: DC
  Administered 2018-09-04 – 2018-09-11 (×8): 10 mg via ORAL
  Filled 2018-09-04 (×8): qty 1

## 2018-09-04 NOTE — ED Notes (Signed)
Pt requesting medications for anxiety and to "help him rest". Clarise Cruz, RN aware.

## 2018-09-04 NOTE — ED Notes (Signed)
ED TO INPATIENT HANDOFF REPORT  Name/Age/Gender [name] 73 y.o. male  Code Status Code Status History    Date Active Date Inactive Code Status Order ID Comments User Context   08/17/2018 1718 09/02/2018 2346 Full Code [id number]  Louellen Molder, MD ED   04/16/2018 1757 04/27/2018 1949 Full Code [id number]  Louellen Molder, MD Inpatient   03/15/2018 1915 03/23/2018 2317 Full Code [id number]  Ardeen Jourdain, PA-C ED   02/15/2018 1803 02/23/2018 2255 Full Code [id number]  Jani Gravel, MD ED   03/17/2012 1321 03/20/2012 1826 Full Code [id number]  Crutchfield, Antony Haste, RN Inpatient      Home/SNF/Other Nursing Home  Chief Complaint fall  Level of Care/Admitting Diagnosis ED Disposition    ED Disposition Condition [place]: [facility] [100102]  Level of Care: Telemetry [5]  Admit to tele based on following criteria: Complex arrhythmia (Bradycardia/Tachycardia)  Diagnosis: Recurrent 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 [735329]  Admitting Physician: Vianne Bulls [[id number]]  Attending Physician: Vianne Bulls [[id number]]  PT Class (Do Not Modify): Observation [104]  PT Acc Code (Do Not Modify): Observation [10022]       Medical History Past Medical History:  Diagnosis Date  . Anginal pain ([place]) 2006   evaluated by cardio  . Arthritis    knees,feet,shoulders,elbows.hands  . Constipation   . Dyspnea    with exertion   . Dysrhythmia    Atrial Flutter- 2006- corredted itself  . Family history of adverse reaction to anesthesia    mother- with novocaine went into shock  . GERD (gastroesophageal reflux disease)   . Headache   . Hemorrhoids   . History of blood transfusion   . Hypertension   . Insomnia   . Sleep apnea    cpap  . 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 Bee Venom Swelling  . Nickel Rash    IV Location/Drains/Wounds Patient Lines/Drains/Airways Status   Active  Line/Drains/Airways    Name:   Placement date:   Placement time:   Site:   Days:   Incision (Closed) Hip Left;Lateral   -    -               Labs/Imaging Results for orders placed or performed during the hospital encounter of 09/03/18 (from the past 48 hour(s))  Comprehensive metabolic panel     Status: Abnormal   Collection Time: 09/03/18  7:59 PM  Result Value Ref Range   Sodium 142 135 - 145 mmol/L   Potassium 4.2 3.5 - 5.1 mmol/L   Chloride 106 98 - 111 mmol/L   CO2 27 22 - 32 mmol/L   Glucose, Bld 109 (H) 70 - 99 mg/dL   BUN 19 8 - 23 mg/dL   Creatinine, Ser 0.95 0.61 - 1.24 mg/dL   Calcium 9.5 8.9 - 10.3 mg/dL   Total Protein 6.3 (L) 6.5 - 8.1 g/dL   Albumin 2.8 (L) 3.5 - 5.0 g/dL   AST 17 15 - 41 U/L   ALT 9 0 - 44 U/L   Alkaline Phosphatase 60 38 - 126 U/L   Total Bilirubin 0.3 0.3 - 1.2 mg/dL   GFR calc non Af Amer >60 >60 mL/min   GFR calc Af Amer >60 >60 mL/min   Anion gap 9 5 - 15    Comment: Performed at [facility], [place] [address], [place], [place] [address]  CBC with  Differential     Status: Abnormal   Collection Time: 09/03/18  7:59 PM  Result Value Ref Range   WBC 8.5 4.0 - 10.5 K/uL   RBC 3.85 (L) 4.22 - 5.81 MIL/uL   Hemoglobin 10.2 (L) 13.0 - 17.0 g/dL   HCT 34.8 (L) 39.0 - 52.0 %   MCV 90.4 80.0 - 100.0 fL   MCH 26.5 26.0 - 34.0 pg   MCHC 29.3 (L) 30.0 - 36.0 g/dL   RDW 17.4 (H) 11.5 - 15.5 %   Platelets 315 150 - 400 K/uL   nRBC 0.0 0.0 - 0.2 %   Neutrophils Relative % 71 %   Neutro Abs 6.2 1.7 - 7.7 K/uL   Lymphocytes Relative 15 %   Lymphs Abs 1.3 0.7 - 4.0 K/uL   Monocytes Relative 10 %   Monocytes Absolute 0.8 0.1 - 1.0 K/uL   Eosinophils Relative 2 %   Eosinophils Absolute 0.1 0.0 - 0.5 K/uL   Basophils Relative 1 %   Basophils Absolute 0.0 0.0 - 0.1 K/uL   Immature Granulocytes 1 %   Abs Immature Granulocytes 0.04 0.00 - 0.07 K/uL    Comment: Performed at [facility], [place] [address],  [place], [place] [address]  Urinalysis, Routine w reflex microscopic     Status: Abnormal   Collection Time: 09/03/18  7:59 PM  Result Value Ref Range   Color, Urine YELLOW YELLOW   APPearance CLEAR CLEAR   Specific Gravity, Urine 1.016 1.005 - 1.030   pH 7.0 5.0 - 8.0   Glucose, UA NEGATIVE NEGATIVE mg/dL   Hgb urine dipstick NEGATIVE NEGATIVE   Bilirubin Urine NEGATIVE NEGATIVE   Ketones, ur NEGATIVE NEGATIVE mg/dL   Protein, ur NEGATIVE NEGATIVE mg/dL   Nitrite NEGATIVE NEGATIVE   Leukocytes, UA TRACE (A) NEGATIVE   RBC / HPF 0-5 0 - 5 RBC/hpf   WBC, UA 0-5 0 - 5 WBC/hpf   Bacteria, UA NONE SEEN NONE SEEN   Squamous Epithelial / LPF 0-5 0 - 5   Mucus PRESENT    Hyaline Casts, UA PRESENT     Comment: Performed at [facility], [place] [address] [place], [place] [address]  Troponin I - ONCE - STAT     Status: None   Collection Time: 09/03/18  7:59 PM  Result Value Ref Range   Troponin I <0.03 <0.03 ng/mL    Comment: Performed at [facility], [place] [address] [place], [place] [address]   Dg Chest 2 View  Result Date: 09/03/2018 CLINICAL DATA:  Per EMS, pt fell at home this afternoon. Pt complains of left leg knee pain, which EMS states he had before the fall. Pt has hx of chronic back pain. EXAM: CHEST - 2 VIEW COMPARISON:  08/29/2018 and older studies. FINDINGS: Cardiac silhouette is mildly enlarged. No mediastinal or hilar masses. There is bilateral lung base opacity, greater on the right. Opacity is consistent with a combination of small effusions and either atelectasis or infiltrate. Right lung base opacity appears increased from the most recent prior exam. There are prominent bronchovascular markings elsewhere. Linear opacities noted in the left mid lung peripherally consistent with scarring. These findings are stable. No pneumothorax. Skeletal structures are demineralized but grossly intact. IMPRESSION: 1. Bilateral lung base opacities, right greater  than left, with the right appearing increased compared to the most recent prior chest radiograph, left stable. Lung base opacities are consistent with a combination of pleural fluid and either  atelectasis or infection. No convincing pulmonary edema. Electronically Signed   By: Lajean Manes M.D.   On: 09/03/2018 21:05   Ct Head Wo Contrast  Result Date: 09/03/2018 CLINICAL DATA:  Fall. Headache and neck pain. EXAM: CT HEAD WITHOUT CONTRAST CT CERVICAL SPINE WITHOUT CONTRAST TECHNIQUE: Multidetector CT imaging of the head and cervical spine was performed following the standard protocol without intravenous contrast. Multiplanar CT image reconstructions of the cervical spine were also generated. COMPARISON:  07/18/2018 CT head. Cervical spine radiographs from 11/12/2016 FINDINGS: CT HEAD FINDINGS Brain: The brainstem, cerebellum, cerebral peduncles, thalami, basal ganglia, basilar cisterns, and ventricular system appear within normal limits. No intracranial hemorrhage, mass lesion, or acute CVA. Vascular: Unremarkable Skull: Unremarkable Sinuses/Orbits: Prior right maxillary antrostomy. Other: No supplemental non-categorized findings. CT CERVICAL SPINE FINDINGS Alignment: 2 mm degenerative anterolisthesis at C6-7 and C7-T1. Skull base and vertebrae: Chronic defect in the anterior arch of C1 as shown on image 24/5, along with a chronic nonunited fracture at the junction of the lateral mass of C1 and the left C1 lamina. Small ossicle along the left upper margin of the odontoid on image 20/5, with small erosions of the odontoid. Fused right facet joint at C2-3. Prominent anterior intervertebral spurring with articulating spurs at all levels between C3 and C7. No appreciable acute cervical spine fractures. Soft tissues and spinal canal: Pannus posterior to the odontoid measuring up to 6 mm in thickness. Common carotid atherosclerotic calcification bilaterally. Disc levels: Osseous foraminal narrowing on the right at  C3-4 and T2-3; and on the left at C3-4, C4-5, C7-T1, T1-2, and T2-3. Upper chest: Abnormal right posterior pleural thickening likely from the previously seen pleural effusion, although slightly more lobular. Other: No supplemental non-categorized findings. IMPRESSION: 1. No acute intracranial findings or acute cervical spine findings. 2. 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 3. Erosions of C1 anteriorly and of the odontoid, with pannus posterior to the odontoid. 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 4. Multilevel osseous foraminal stenosis due to spurring in the cervical spine. 5. Right pleural effusion at the lung apex. Electronically Signed   By: Van Clines M.D.   On: 09/03/2018 21:36   Ct Cervical Spine Wo Contrast  Result Date: 09/03/2018 CLINICAL DATA:  Fall. Headache and neck pain. EXAM: CT HEAD WITHOUT CONTRAST CT CERVICAL SPINE WITHOUT CONTRAST TECHNIQUE: Multidetector CT imaging of the head and cervical spine was performed following the standard protocol without intravenous contrast. Multiplanar CT image reconstructions of the cervical spine were also generated. COMPARISON:  07/18/2018 CT head. Cervical spine radiographs from 11/12/2016 FINDINGS: CT HEAD FINDINGS Brain: The brainstem, cerebellum, cerebral peduncles, thalami, basal ganglia, basilar cisterns, and ventricular system appear within normal limits. No intracranial hemorrhage, mass lesion, or acute CVA. Vascular: Unremarkable Skull: Unremarkable Sinuses/Orbits: Prior right maxillary antrostomy. Other: No supplemental non-categorized findings. CT CERVICAL SPINE FINDINGS Alignment: 2 mm degenerative anterolisthesis at C6-7 and C7-T1. Skull base and vertebrae: Chronic defect in the anterior arch of C1 as shown on image 24/5, along with a chronic nonunited fracture at the junction of the lateral mass of C1 and the left C1 lamina. Small ossicle along the left upper  margin of the odontoid on image 20/5, with small erosions of the odontoid. Fused right facet joint at C2-3. Prominent anterior intervertebral spurring with articulating spurs at all levels between C3 and C7. No appreciable acute cervical spine fractures. Soft tissues and spinal canal: Pannus posterior to  the odontoid measuring up to 6 mm in thickness. Common carotid atherosclerotic calcification bilaterally. Disc levels: Osseous foraminal narrowing on the right at C3-4 and T2-3; and on the left at C3-4, C4-5, C7-T1, T1-2, and T2-3. Upper chest: Abnormal right posterior pleural thickening likely from the previously seen pleural effusion, although slightly more lobular. Other: No supplemental non-categorized findings. IMPRESSION: 1. No acute intracranial findings or acute cervical spine findings. 2. 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 3. Erosions of C1 anteriorly and of the odontoid, with pannus posterior to the odontoid. 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 4. Multilevel osseous foraminal stenosis due to spurring in the cervical spine. 5. Right pleural effusion at the lung apex. Electronically Signed   By: Van Clines M.D.   On: 09/03/2018 21:36   Dg Hip Unilat W Or Wo Pelvis 2-3 Views Left  Result Date: 09/03/2018 CLINICAL DATA:  Per EMS, pt fell at home this afternoon. Pt complains of left leg knee pain, which EMS states he had before the fall. Pt has hx of chronic back pain. EXAM: DG HIP (WITH OR WITHOUT PELVIS) 2-3V LEFT COMPARISON:  03/15/2018 FINDINGS: No acute fracture. There are bilateral total hip arthroplasties appear well seated. A fracture of the proximal left femoral shaft has some persistent deformity, but appears healed when compared to the prior exam. Skeletal structures are diffusely demineralized. SI joints and symphysis pubis are normally aligned. IMPRESSION: 1. No acute fracture or dislocation. 2. No evidence  of loosening of either total hip joint arthroplasty. Electronically Signed   By: Lajean Manes M.D.   On: 09/03/2018 21:07   Dg Femur Min 2 Views Left  Result Date: 09/03/2018 CLINICAL DATA:  Per EMS, pt fell at home this afternoon. Pt complains of left leg knee pain, which EMS states he had before the fall. Pt has hx of chronic back pain. EXAM: LEFT FEMUR 2 VIEWS COMPARISON:  03/15/2018 and 01/11/2018. FINDINGS: The previously seen, from 03/15/2018, fracture of the femoral shaft adjacent to the distal aspect the intramedullary stem of the femoral component the prosthesis, appears healed, but with some persistent deformity/angulation. No acute fracture. The left hip total arthroplasty appears well seated with no evidence of loosening. There are advanced degenerative changes of the knee joint. Bones are diffusely demineralized.  Soft tissues are unremarkable. IMPRESSION: 1. No acute fracture or dislocation. Electronically Signed   By: Lajean Manes M.D.   On: 09/03/2018 21:09   EKG Interpretation  Date/Time:  Friday September 03 2018 20:29:49 EST Ventricular Rate:  108 PR Interval:    QRS Duration: 84 QT Interval:  328 QTC Calculation: 439 R Axis:   70 Text Interpretation:  Atrial fibrillation with rapid ventricular response Low voltage QRS Septal infarct , age undetermined Abnormal ECG Confirmed by Lacretia Leigh [phone]) on 09/03/2018 8:40:07 PM   Pending Labs Unresulted Labs (From admission, onward)   None      Vitals/Pain Today's Vitals   09/03/18 1821 09/03/18 1822 09/03/18 2142 09/04/18 0100  BP: 140/85  134/79 130/78  Pulse: 79  (!) 106 (!) 101  Resp: 16  16 18   Temp: 98.2 F (36.8 C)     TempSrc: Oral     SpO2: 99%  97% 93%  PainSc:  7       Isolation Precautions No active isolations  Medications Medications - No data to display  Mobility non-ambulatory

## 2018-09-04 NOTE — Procedures (Signed)
R thoracentesis 1100 cc EBL 0 Comp 0

## 2018-09-04 NOTE — Progress Notes (Signed)
I have seen and examined this patient and agree with plan of care as per my partner who saw this patient earlier this morning  73 year old male hospitalized recently 12/17-1/2 known A. fib on Eliquis giant cell arteritis OSA BiPAP prior total hip arthroplasty left side: Obesity found to be recently in A. fib RVR with a right-sided effusion that was tapped he was recommended to go to a skilled facility but was declined for this and had to go home  Presented to the emergency room on 1/4 after just 2 days of being at home with generalized weakness falls and shortness of breath-work-up in the emergency room showed A. fib heart rate 108 and a recurrent right-sided pleural effusion  He is awake alert no distress but is understandably frustrated and wants to know why he has these issues He has no chest pain no fever no chills Abdomen is soft obese cannot appreciate organomegaly lower extremities are soft without swelling  Getting ultrasound-guided thoracentesis I have added cell count and pathology as it is unusual to reaccumulate fluid this quickly-he has been a prior smoker but that was a long time ago and he had a CT scan of the chest on most recent admission which did not show any evidence of mass or malignancy  By light's criteria his pleural fluid seems to be transudative, Fluid protein 3, serum protein 6.3 Fluid LDH 43, serum LDH 139  -I will search for other causes of effusion--potential could be that patient has hepatic hydrothorax or chronic hypoalbuminemia [more likely would cause bilateral effusions?  ] So may scan the liver as he has a habitus suggestive of the same--- I will give albumin 50 mg x 1 now  He has history of a prosthetic joint infection and was supposed to be on Flagyl through 11/19/2018 which will be continued Lasix beta-blocker and his Eliquis for anticoagulation   Verneita Griffes, MD [facility] 3:21 PM

## 2018-09-04 NOTE — H&P (Signed)
History and Physical    [name] OEU:[id number] DOB: February 28, 1946 DOA: 09/03/2018  PCP: [name], MD   Patient coming from: Home   Chief Complaint: Falls, gen weakness, SOB  HPI: [name] is a 73 y.o. 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  The patient was admitted to the hospital from 08/17/2018 until 09/02/2018 with atrial fibrillation with RVR, shortness of breath with right pleural effusion, underwent thoracentesis, respiratory status improved, PT recommended SNF, but patient elected to go home with home health and family assistance.  Since returning home, he has become increasingly more dyspneic with mild nonproductive cough.  He also reports ongoing generalized weakness and has had multiple falls secondary to this.  He denies hitting his head or losing consciousness.  Reports that he has had intermittent fevers "for years," but no recent change in this and no shaking chills.  He complains of left knee pain after the fall, as well as back and right shoulder pain that has been chronic and not significantly changed.  ED Course: Upon arrival to the ED, patient is found to be afebrile, saturating well on room air, slightly tachycardic, and with stable blood pressure.  EKG features atrial fibrillation with rate 108.  Noncontrast head CT is negative for acute intracranial abnormality and cervical spine CT is negative for acute findings.  Radiographs of the left knee and hips are negative.  Chest x-ray is notable for bilateral basilar opacities, right greater than left, with right side worsened from the recent prior and likely pleural effusion and atelectasis.  Chemistry panel is notable for an albumin of 2.8 and CBC features a stable  chronic normocytic anemia.  Urinalysis is unremarkable and troponin undetectable.  Patient will be observed for further evaluation and management of shortness of breath with recurrent right-sided pleural effusion, as well as physical debility.   Review of Systems:  All other systems reviewed and apart from HPI, are negative.  Past Medical History:  Diagnosis Date  . Anginal pain ([place]) 2006   evaluated by cardio  . Arthritis    knees,feet,shoulders,elbows.hands  . Constipation   . Dyspnea    with exertion   . Dysrhythmia    Atrial Flutter- 2006- corredted itself  . Family history of adverse reaction to anesthesia    mother- with novocaine went into shock  . GERD (gastroesophageal reflux disease)   . Headache   . Hemorrhoids   . History of blood transfusion   . Hypertension   . Insomnia   . Sleep apnea    cpap  . 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 APPENDECTOMY    . ARTERY BIOPSY Left 12/30/2017   Procedure: BIOPSY TEMPORAL ARTERY;  Surgeon: Fanny Skates, MD;  Location: [facility];  Service: General;  Laterality: Left;  . CHOLECYSTECTOMY    . COLONOSCOPY W/ POLYPECTOMY    . ESOPHAGOGASTRODUODENOSCOPY (EGD) WITH PROPOFOL  07/06/2012   Procedure: ESOPHAGOGASTRODUODENOSCOPY (EGD) WITH PROPOFOL;  Surgeon: Jeryl Columbia, MD;  Location: [facility];  Service: Endoscopy;  Laterality: N/A;  . ESOPHAGOSCOPY    . EYE SURGERY     left eye- muscle repair  . HERNIA REPAIR     ventral hernia  . INCISION AND DRAINAGE HIP Left  03/17/2018   Procedure: IRRIGATION AND DEBRIDEMENT LEFT THIGH WOUND;  Surgeon: Gaynelle Arabian, MD;  Location: [facility];  Service: Orthopedics;  Laterality: Left;  . JOINT REPLACEMENT     bilateral hips.  Right broke and had to be re placed again  . MASS EXCISION N/A 03/02/2015   Procedure: EXCISION ABDOMINAL WALL MASS;  Surgeon: Alphonsa Overall, MD;  Location: [facility];  Service: General;  Laterality: N/A;  .  TOTAL HIP REVISION Left 02/17/2018   Procedure: Left total hip arthroplasty revision;  Surgeon: Gaynelle Arabian, MD;  Location: [facility];  Service: Orthopedics;  Laterality: Left;  Marland Kitchen VERTICAL BANDED GASTROPLASTY       reports that he quit smoking about 34 years ago. His smoking use included cigarettes. He started smoking about 52 years ago. He has a 20.00 pack-year smoking history. He has never used smokeless tobacco. He reports current alcohol use. He reports that he does not use drugs.  Allergies  Allergen Reactions  . Bee Venom Swelling  . Nickel Rash    Family History  Problem Relation Age of Onset  . Cancer Mother   . Alcohol abuse Father      Prior to Admission medications   Medication Sig Start Date End Date Taking? Authorizing Provider  acetaminophen (TYLENOL) 325 MG tablet Take 2 tablets (650 mg total) by mouth every 6 (six) hours as needed for mild pain, fever or headache. 04/27/18   Barton Dubois, MD  albuterol (PROVENTIL HFA;VENTOLIN HFA) 108 (90 Base) MCG/ACT inhaler Inhale 1-2 puffs into the lungs every 6 (six) hours as needed for wheezing or shortness of breath. 09/02/18   Hongalgi, Lenis Dickinson, MD  apixaban (ELIQUIS) 5 MG TABS tablet Take 1 tablet (5 mg total) by mouth 2 (two) times daily. 09/02/18   Hongalgi, Lenis Dickinson, MD  calcium carbonate (TUMS - DOSED IN MG ELEMENTAL CALCIUM) 500 MG chewable tablet Chew 1,000 mg by mouth 3 (three) times daily as needed for indigestion.    [provider]  carboxymethylcellulose (REFRESH TEARS) 0.5 % SOLN Place 1 drop into both eyes daily as needed (dry eye/irritation). 09/02/18   Hongalgi, Lenis Dickinson, MD  Cholecalciferol (VITAMIN D3) 2000 units TABS Take 2,000 Units by mouth every evening.     [provider]  cycloSPORINE (RESTASIS) 0.05 % ophthalmic emulsion Place 1 drop into both eyes 2 (two) times daily. 09/02/18   Hongalgi, Lenis Dickinson, MD  diltiazem (CARDIZEM CD) 180 MG 24 hr capsule Take 1 capsule (180 mg total) by mouth daily.  09/03/18   Hongalgi, Lenis Dickinson, MD  escitalopram (LEXAPRO) 10 MG tablet Take 1 tablet (10 mg total) by mouth daily. 09/02/18   Hongalgi, Lenis Dickinson, MD  ferrous sulfate 325 (65 FE) MG tablet Take 1 tablet (325 mg total) by mouth 3 (three) times daily with meals. 09/02/18   Hongalgi, Lenis Dickinson, MD  furosemide (LASIX) 20 MG tablet Take 1 tablet (20 mg total) by mouth daily. 09/02/18   Hongalgi, Lenis Dickinson, MD  gabapentin (NEURONTIN) 100 MG capsule Take 1 capsule (100 mg total) by mouth 3 (three) times daily. 09/02/18   Hongalgi, Lenis Dickinson, MD  hydrOXYzine (ATARAX/VISTARIL) 25 MG tablet Take 1 tablet (25 mg total) by mouth 3 (three) times daily as needed for itching or anxiety. 09/02/18   Hongalgi, Lenis Dickinson, MD  Melatonin 3 MG TABS Take 6 mg by mouth at bedtime.    [provider]  metoprolol tartrate (LOPRESSOR) 25 MG tablet Take 1 tablet (25 mg total) by  mouth 2 (two) times daily. 09/02/18   Hongalgi, Lenis Dickinson, MD  metroNIDAZOLE (FLAGYL) 500 MG tablet Take 1 tablet (500 mg total) by mouth 2 (two) times daily. For 90 days from 08/22/2018.  Will need refills. 09/02/18   Hongalgi, Lenis Dickinson, MD  Multiple Vitamins-Minerals (CENTRUM ADULTS PO) Take 1 tablet by mouth every evening.     [provider]  oxyCODONE (OXY IR/ROXICODONE) 5 MG immediate release tablet Take 1 tablet (5 mg total) by mouth every 6 (six) hours as needed for moderate pain, severe pain or breakthrough pain. 09/02/18   Hongalgi, Lenis Dickinson, MD  pantoprazole (PROTONIX) 40 MG tablet Take 1 tablet (40 mg total) by mouth daily. 09/02/18   Hongalgi, Lenis Dickinson, MD  polyethylene glycol (MIRALAX / GLYCOLAX) packet Take 17 g by mouth daily as needed for mild constipation. 09/02/18   Hongalgi, Lenis Dickinson, MD  potassium chloride SA (K-DUR,KLOR-CON) 20 MEQ tablet Take 1 tablet (20 mEq total) by mouth daily. 09/03/18   Hongalgi, Lenis Dickinson, MD  rOPINIRole (REQUIP) 0.25 MG tablet Take 1 tablet (0.25 mg total) by mouth at bedtime. 09/02/18   Hongalgi, Lenis Dickinson, MD  senna-docusate  (SENOKOT-S) 8.6-50 MG tablet Take 2 tablets by mouth 2 (two) times daily. 09/02/18   Hongalgi, Lenis Dickinson, MD  thiamine 100 MG tablet Take 100 mg by mouth daily. 07/15/18   [provider]    Physical Exam: Vitals:   09/03/18 1821 09/03/18 2142 09/04/18 0100 09/04/18 0208  BP: 140/85 134/79 130/78 (!) 132/91  Pulse: 79 (!) 106 (!) 101 (!) 110  Resp: 16 16 18 20   Temp: 98.2 F (36.8 C)   98.7 F (37.1 C)  TempSrc: Oral   Oral  SpO2: 99% 97% 93% 96%    Constitutional: NAD, calm  Eyes: PERTLA, lids and conjunctivae normal ENMT: Mucous membranes are moist. Posterior pharynx clear of any exudate or lesions.   Neck: normal, supple, no masses, no thyromegaly Respiratory: Diminished bilaterally. No accessory muscle use.  Cardiovascular: Rate ~100 and irregular. No extremity edema.   Abdomen: No distension, no tenderness, soft. Bowel sounds active.  Musculoskeletal: no clubbing / cyanosis. No joint deformity upper and lower extremities.   Skin: no significant rashes, lesions, ulcers. Warm, dry, well-perfused. Neurologic: no facial asymmetry. Sensation intact. Moving all extremities.  Psychiatric: Alert and oriented x 3. Calm, cooperative.    Labs on Admission: I have personally reviewed following labs and imaging studies  CBC: Recent Labs  Lab 08/29/18 0432 09/03/18 1959  WBC 7.0 8.5  NEUTROABS  --  6.2  HGB 8.4* 10.2*  HCT 29.3* 34.8*  MCV 92.7 90.4  PLT 301 092   Basic Metabolic Panel: Recent Labs  Lab 08/29/18 0432 08/30/18 0432 08/31/18 0438 09/03/18 1959  NA 141 142 142 142  K 3.9 3.5 3.9 4.2  CL 105 105 104 106  CO2 29 29 30 27   GLUCOSE 101* 103* 104* 109*  BUN 18 16 16 19   CREATININE 0.94 0.84 0.92 0.95  CALCIUM 8.6* 9.0 8.6* 9.5   GFR: Estimated Creatinine Clearance: 97.5 mL/min (by C-G formula based on SCr of 0.95 mg/dL). Liver Function Tests: Recent Labs  Lab 09/03/18 1959  AST 17  ALT 9  ALKPHOS 60  BILITOT 0.3  PROT 6.3*  ALBUMIN 2.8*    No results for input(s): LIPASE, AMYLASE in the last 168 hours. No results for input(s): AMMONIA in the last 168 hours. Coagulation Profile: No results for input(s): INR, PROTIME in the last 168  hours. Cardiac Enzymes: Recent Labs  Lab 09/03/18 1959  TROPONINI <0.03   BNP (last 3 results) No results for input(s): PROBNP in the last 8760 hours. HbA1C: No results for input(s): HGBA1C in the last 72 hours. CBG: No results for input(s): GLUCAP in the last 168 hours. Lipid Profile: No results for input(s): CHOL, HDL, LDLCALC, TRIG, CHOLHDL, LDLDIRECT in the last 72 hours. Thyroid Function Tests: No results for input(s): TSH, T4TOTAL, FREET4, T3FREE, THYROIDAB in the last 72 hours. Anemia Panel: No results for input(s): VITAMINB12, FOLATE, FERRITIN, TIBC, IRON, RETICCTPCT in the last 72 hours. Urine analysis:    Component Value Date/Time   COLORURINE YELLOW 09/03/2018 1959   APPEARANCEUR CLEAR 09/03/2018 1959   APPEARANCEUR Clear 01/18/2018 1539   LABSPEC 1.016 09/03/2018 1959   PHURINE 7.0 09/03/2018 1959   GLUCOSEU NEGATIVE 09/03/2018 1959   HGBUR NEGATIVE 09/03/2018 1959   BILIRUBINUR NEGATIVE 09/03/2018 1959   BILIRUBINUR Negative 01/18/2018 1539   [place] 09/03/2018 1959   PROTEINUR NEGATIVE 09/03/2018 1959   UROBILINOGEN 0.2 09/06/2012 0213   NITRITE NEGATIVE 09/03/2018 1959   LEUKOCYTESUR TRACE (A) 09/03/2018 1959   LEUKOCYTESUR Negative 01/18/2018 1539   Sepsis Labs: @LABRCNTIP (procalcitonin:4,lacticidven:4) ) Recent Results (from the past 240 hour(s))  Culture, body fluid-bottle     Status: None   Collection Time: 08/26/18 12:52 PM  Result Value Ref Range Status   Specimen Description FLUID PLEURAL RIGHT  Final   Special Requests BOTTLES DRAWN AEROBIC AND ANAEROBIC  Final   Culture   Final    NO GROWTH 5 DAYS Performed at [facility], [place] [address] [place], [place] [address]    Report Status 08/31/2018 FINAL  Final  Gram stain      Status: None   Collection Time: 08/26/18 12:52 PM  Result Value Ref Range Status   Specimen Description FLUID PLEURAL RIGHT  Final   Special Requests NONE  Final   Gram Stain   Final    CYTOSPIN SMEAR WBC PRESENT,BOTH PMN AND MONONUCLEAR NO ORGANISMS SEEN Performed at [facility], [place] [address], [place], [place] [address]    Report Status 08/26/2018 FINAL  Final     Radiological Exams on Admission: Dg Chest 2 View  Result Date: 09/03/2018 CLINICAL DATA:  Per EMS, pt fell at home this afternoon. Pt complains of left leg knee pain, which EMS states he had before the fall. Pt has hx of chronic back pain. EXAM: CHEST - 2 VIEW COMPARISON:  08/29/2018 and older studies. FINDINGS: Cardiac silhouette is mildly enlarged. No mediastinal or hilar masses. There is bilateral lung base opacity, greater on the right. Opacity is consistent with a combination of small effusions and either atelectasis or infiltrate. Right lung base opacity appears increased from the most recent prior exam. There are prominent bronchovascular markings elsewhere. Linear opacities noted in the left mid lung peripherally consistent with scarring. These findings are stable. No pneumothorax. Skeletal structures are demineralized but grossly intact. IMPRESSION: 1. Bilateral lung base opacities, right greater than left, with the right appearing increased compared to the most recent prior chest radiograph, left stable. Lung base opacities are consistent with a combination of pleural fluid and either atelectasis or infection. No convincing pulmonary edema. Electronically Signed   By: Lajean Manes M.D.   On: 09/03/2018 21:05   Ct Head Wo Contrast  Result Date: 09/03/2018 CLINICAL DATA:  Fall. Headache and neck pain. EXAM: CT HEAD WITHOUT CONTRAST CT CERVICAL SPINE WITHOUT CONTRAST TECHNIQUE: Multidetector CT  imaging of the head and cervical spine was performed following the standard protocol without intravenous contrast. Multiplanar  CT image reconstructions of the cervical spine were also generated. COMPARISON:  07/18/2018 CT head. Cervical spine radiographs from 11/12/2016 FINDINGS: CT HEAD FINDINGS Brain: The brainstem, cerebellum, cerebral peduncles, thalami, basal ganglia, basilar cisterns, and ventricular system appear within normal limits. No intracranial hemorrhage, mass lesion, or acute CVA. Vascular: Unremarkable Skull: Unremarkable Sinuses/Orbits: Prior right maxillary antrostomy. Other: No supplemental non-categorized findings. CT CERVICAL SPINE FINDINGS Alignment: 2 mm degenerative anterolisthesis at C6-7 and C7-T1. Skull base and vertebrae: Chronic defect in the anterior arch of C1 as shown on image 24/5, along with a chronic nonunited fracture at the junction of the lateral mass of C1 and the left C1 lamina. Small ossicle along the left upper margin of the odontoid on image 20/5, with small erosions of the odontoid. Fused right facet joint at C2-3. Prominent anterior intervertebral spurring with articulating spurs at all levels between C3 and C7. No appreciable acute cervical spine fractures. Soft tissues and spinal canal: Pannus posterior to the odontoid measuring up to 6 mm in thickness. Common carotid atherosclerotic calcification bilaterally. Disc levels: Osseous foraminal narrowing on the right at C3-4 and T2-3; and on the left at C3-4, C4-5, C7-T1, T1-2, and T2-3. Upper chest: Abnormal right posterior pleural thickening likely from the previously seen pleural effusion, although slightly more lobular. Other: No supplemental non-categorized findings. IMPRESSION: 1. No acute intracranial findings or acute cervical spine findings. 2. 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 3. Erosions of C1 anteriorly and of the odontoid, with pannus posterior to the odontoid. 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 4. Multilevel osseous foraminal stenosis due to spurring in  the cervical spine. 5. Right pleural effusion at the lung apex. Electronically Signed   By: Van Clines M.D.   On: 09/03/2018 21:36   Ct Cervical Spine Wo Contrast  Result Date: 09/03/2018 CLINICAL DATA:  Fall. Headache and neck pain. EXAM: CT HEAD WITHOUT CONTRAST CT CERVICAL SPINE WITHOUT CONTRAST TECHNIQUE: Multidetector CT imaging of the head and cervical spine was performed following the standard protocol without intravenous contrast. Multiplanar CT image reconstructions of the cervical spine were also generated. COMPARISON:  07/18/2018 CT head. Cervical spine radiographs from 11/12/2016 FINDINGS: CT HEAD FINDINGS Brain: The brainstem, cerebellum, cerebral peduncles, thalami, basal ganglia, basilar cisterns, and ventricular system appear within normal limits. No intracranial hemorrhage, mass lesion, or acute CVA. Vascular: Unremarkable Skull: Unremarkable Sinuses/Orbits: Prior right maxillary antrostomy. Other: No supplemental non-categorized findings. CT CERVICAL SPINE FINDINGS Alignment: 2 mm degenerative anterolisthesis at C6-7 and C7-T1. Skull base and vertebrae: Chronic defect in the anterior arch of C1 as shown on image 24/5, along with a chronic nonunited fracture at the junction of the lateral mass of C1 and the left C1 lamina. Small ossicle along the left upper margin of the odontoid on image 20/5, with small erosions of the odontoid. Fused right facet joint at C2-3. Prominent anterior intervertebral spurring with articulating spurs at all levels between C3 and C7. No appreciable acute cervical spine fractures. Soft tissues and spinal canal: Pannus posterior to the odontoid measuring up to 6 mm in thickness. Common carotid atherosclerotic calcification bilaterally. Disc levels: Osseous foraminal narrowing on the right at C3-4 and T2-3; and on the left at C3-4, C4-5, C7-T1, T1-2, and T2-3. Upper chest: Abnormal right posterior pleural thickening likely from the previously seen pleural  effusion,  although slightly more lobular. Other: No supplemental non-categorized findings. IMPRESSION: 1. No acute intracranial findings or acute cervical spine findings. 2. 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 3. Erosions of C1 anteriorly and of the odontoid, with pannus posterior to the odontoid. 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 4. Multilevel osseous foraminal stenosis due to spurring in the cervical spine. 5. Right pleural effusion at the lung apex. Electronically Signed   By: Van Clines M.D.   On: 09/03/2018 21:36   Dg Hip Unilat W Or Wo Pelvis 2-3 Views Left  Result Date: 09/03/2018 CLINICAL DATA:  Per EMS, pt fell at home this afternoon. Pt complains of left leg knee pain, which EMS states he had before the fall. Pt has hx of chronic back pain. EXAM: DG HIP (WITH OR WITHOUT PELVIS) 2-3V LEFT COMPARISON:  03/15/2018 FINDINGS: No acute fracture. There are bilateral total hip arthroplasties appear well seated. A fracture of the proximal left femoral shaft has some persistent deformity, but appears healed when compared to the prior exam. Skeletal structures are diffusely demineralized. SI joints and symphysis pubis are normally aligned. IMPRESSION: 1. No acute fracture or dislocation. 2. No evidence of loosening of either total hip joint arthroplasty. Electronically Signed   By: Lajean Manes M.D.   On: 09/03/2018 21:07   Dg Femur Min 2 Views Left  Result Date: 09/03/2018 CLINICAL DATA:  Per EMS, pt fell at home this afternoon. Pt complains of left leg knee pain, which EMS states he had before the fall. Pt has hx of chronic back pain. EXAM: LEFT FEMUR 2 VIEWS COMPARISON:  03/15/2018 and 01/11/2018. FINDINGS: The previously seen, from 03/15/2018, fracture of the femoral shaft adjacent to the distal aspect the intramedullary stem of the femoral component the prosthesis, appears healed, but with some persistent  deformity/angulation. No acute fracture. The left hip total arthroplasty appears well seated with no evidence of loosening. There are advanced degenerative changes of the knee joint. Bones are diffusely demineralized.  Soft tissues are unremarkable. IMPRESSION: 1. No acute fracture or dislocation. Electronically Signed   By: Lajean Manes M.D.   On: 09/03/2018 21:09    EKG: Independently reviewed. Atrial fibrillation, rate 108.   Assessment/Plan  1. Recurrent pleural effusion  - Presents with persistent gen weakness and debility, ongoing falls, and reports worsening in SOB and non-productive cough  - He had thoracentesis during recent admission, reports significant improvement after that until progressively re-worsening over past cpl days, and is found to have recurrent effusion  - Recent cytology was negative, fluid analysis suggested possible exudate but not cell counts not supportive of bacterial infection  - Will consult with IR to see if there is enough fluid there for thoracentesis, would be therapeutic and also to repeat fluid analysis   2. Debility  - PT recommended SNF during recent admit, but d/t insurance issues, patient elected to go home with home health and 24/7 assistance from family  - Family has not been available and patient reports being unable to care for himself  - SW is on the case for dispo planning  - Continue supportive care   3. Prosthetic joint infection  - Follows with ID at [facility], completed a course of ertapenem and continued on Flagyl through November 19, 2018  - No acute infectious s/s, continue Flagyl    4. Chronic diastolic CHF  - Appears compensated  - Continue Lasix and beta-blocker - Follow  daily wt    5. Depression; anxiety  - Continue Lexapro and as-needed Vistaril    6. Chronic pain  - Complains of chronic right shoulder pain with no acute injury, attributed to OA   - Continue Neurontin, prn APAP, prn oxycodone    7. Atrial fibrillation  - In  rate-controlled a fib on admission  - CHADS-VASc is at least 2 (age, CHF)  - Continue Eliquis, diltiazem, and metoprolol   8. Anemia  - Hgb is 10.2 on admission, higher than recent priors  - Likely secondary to chronic disease  - He will continue to supplement iron and vitamins     DVT prophylaxis: Eliquis  Code Status: Full  Family Communication: Discussed with patient   Consults called: None Admission status: Observation     Vianne Bulls, MD [facility] Pager [phone]  If 7PM-7AM, please contact night-coverage [email or web address] Password [facility]  09/04/2018, 2:37 AM

## 2018-09-05 ENCOUNTER — Observation Stay (HOSPITAL_COMMUNITY): Payer: Medicare Other

## 2018-09-05 DIAGNOSIS — J90 Pleural effusion, not elsewhere classified: Secondary | ICD-10-CM | POA: Diagnosis not present

## 2018-09-05 LAB — CBC WITH DIFFERENTIAL/PLATELET
Abs Immature Granulocytes: 0.01 10*3/uL (ref 0.00–0.07)
Basophils Absolute: 0 10*3/uL (ref 0.0–0.1)
Basophils Relative: 1 %
Eosinophils Absolute: 0.3 10*3/uL (ref 0.0–0.5)
Eosinophils Relative: 5 %
HCT: 29.3 % — ABNORMAL LOW (ref 39.0–52.0)
Hemoglobin: 8.6 g/dL — ABNORMAL LOW (ref 13.0–17.0)
Immature Granulocytes: 0 %
Lymphocytes Relative: 19 %
Lymphs Abs: 1.2 10*3/uL (ref 0.7–4.0)
MCH: 26.3 pg (ref 26.0–34.0)
MCHC: 29.4 g/dL — ABNORMAL LOW (ref 30.0–36.0)
MCV: 89.6 fL (ref 80.0–100.0)
Monocytes Absolute: 0.9 10*3/uL (ref 0.1–1.0)
Monocytes Relative: 14 %
Neutro Abs: 3.8 10*3/uL (ref 1.7–7.7)
Neutrophils Relative %: 61 %
Platelets: 275 10*3/uL (ref 150–400)
RBC: 3.27 MIL/uL — ABNORMAL LOW (ref 4.22–5.81)
RDW: 17.6 % — ABNORMAL HIGH (ref 11.5–15.5)
WBC: 6.2 10*3/uL (ref 4.0–10.5)
nRBC: 0 % (ref 0.0–0.2)

## 2018-09-05 LAB — COMPREHENSIVE METABOLIC PANEL
ALT: 8 U/L (ref 0–44)
AST: 13 U/L — ABNORMAL LOW (ref 15–41)
Albumin: 2.7 g/dL — ABNORMAL LOW (ref 3.5–5.0)
Alkaline Phosphatase: 46 U/L (ref 38–126)
Anion gap: 9 (ref 5–15)
BUN: 19 mg/dL (ref 8–23)
CO2: 28 mmol/L (ref 22–32)
Calcium: 8.7 mg/dL — ABNORMAL LOW (ref 8.9–10.3)
Chloride: 105 mmol/L (ref 98–111)
Creatinine, Ser: 0.92 mg/dL (ref 0.61–1.24)
GFR calc Af Amer: >60 mL/min (ref >60)
GFR calc non Af Amer: >60 mL/min (ref >60)
Glucose, Bld: 105 mg/dL — ABNORMAL HIGH (ref 70–99)
Potassium: 3.8 mmol/L (ref 3.5–5.1)
Sodium: 142 mmol/L (ref 135–145)
Total Bilirubin: 0.8 mg/dL (ref 0.3–1.2)
Total Protein: 5.3 g/dL — ABNORMAL LOW (ref 6.5–8.1)

## 2018-09-05 LAB — ACID FAST SMEAR (AFB, MYCOBACTERIA): Acid Fast Smear: NEGATIVE

## 2018-09-05 MED ORDER — LIDOCAINE 5 % EX PTCH
1.0000 | MEDICATED_PATCH | CUTANEOUS | Status: DC
  Administered 2018-09-05 – 2018-09-11 (×7): 1 via TRANSDERMAL
  Filled 2018-09-05 (×7): qty 1

## 2018-09-05 MED ORDER — OXYCODONE HCL 5 MG PO TABS
5.0000 mg | ORAL_TABLET | ORAL | Status: DC | PRN
  Administered 2018-09-05 – 2018-09-11 (×24): 5 mg via ORAL
  Filled 2018-09-05 (×25): qty 1

## 2018-09-05 MED ORDER — LORAZEPAM 0.5 MG PO TABS
0.5000 mg | ORAL_TABLET | Freq: Once | ORAL | Status: AC
  Administered 2018-09-05: 0.5 mg via ORAL
  Filled 2018-09-05: qty 1

## 2018-09-05 NOTE — Plan of Care (Signed)
  Problem: Education: Goal: Knowledge of General Education information will improve Description Including pain rating scale, medication(s)/side effects and non-pharmacologic comfort measures Outcome: Progressing   Problem: Health Behavior/Discharge Planning: Goal: Ability to manage health-related needs will improve Outcome: Progressing   Problem: Clinical Measurements: Goal: Ability to maintain clinical measurements within normal limits will improve Outcome: Progressing Goal: Diagnostic test results will improve Outcome: Progressing Goal: Respiratory complications will improve Outcome: Progressing   Problem: Pain Managment: Goal: General experience of comfort will improve Outcome: Progressing

## 2018-09-05 NOTE — Progress Notes (Signed)
Nutrition Brief Note  Patient identified on the Malnutrition Screening Tool (MST) Report  Patient continues to eat 100% of meals. Pt with h/o CHF, on Lasix at home. S/p thoracentesis 1/4. Weight fluctuations are most likely related to fluid shifts.   Wt Readings from Last 15 Encounters:  09/05/18 (!) 138.8 kg  09/02/18 (!) 142.7 kg  04/24/18 (!) 163.6 kg  03/17/18 (!) 155.6 kg  02/20/18 (!) 156 kg  01/11/18 (!) 152 kg  12/30/17 (!) 156.5 kg  12/28/17 (!) 156.5 kg  11/23/17 (!) 154.7 kg  03/02/15 (!) 154.4 kg  02/22/15 (!) 154.4 kg  12/25/14 (!) 152.9 kg  08/03/14 (!) 145.2 kg  05/31/14 (!) 147 kg  03/31/14 (!) 147 kg    Body mass index is 46.54 kg/m. Patient meets criteria for morbid obesity based on current BMI.   Current diet order is heart healthy, patient is consuming approximately 100% of meals at this time. Labs and medications reviewed.   No nutrition interventions warranted at this time. If nutrition issues arise, please consult RD.   Clayton Bibles, MS, RD, [place] [facility] Pager: [phone] After Hours Pager: [phone]

## 2018-09-05 NOTE — Progress Notes (Signed)
[facility] PROGRESS NOTE  [name] [id number] DOB: 03-26-1946 DOA: 09/03/2018 PCP: [name], MD   Narrative: 73 year old male hospitalized recently 12/17-1/2 known A. fib on Eliquis giant cell arteritis OSA BiPAP prior total hip arthroplasty left side,  recently in A. fib RVR with a right-sided effusion that was tapped he was recommended to go to a skilled facility but was declined for this and had to go home  Presented to the emergency room on 1/4 after just 2 days of being at home with generalized weakness falls and shortness of breath-work-up in the emergency room showed A. fib heart rate 108 and a recurrent right-sided pleural effusion   A & Plan Pleural effusion DDX includes heart failure, hypoproteinemia versus hepatic hydrothorax -work-up still underway-continue diuresis maintain negative fluid balance--seems like is inaccurate at this time Right shoulder pain- states that pain is uncontrolled-states was discharged on meds last visit-placing Lidoderm on right shoulder, going from every 6 to every 4 oxycodone and getting x-ray to rule out fracture-Occupational Therapy to see and evaluate and give range of motion exercises Paroxysmal A. Fib [place] vasc >2 on apixaban rate controlled metoprolol 25 twice daily Cardizem 180 OSa on cpap compliant and able to use on his own HTN-Cardizem metoprolol as above Giant cell arteritis history of-stable not on steroids at this time Morbid obesity with weight loss and BMI from 54.8-46.5 over the past year Congratulated on weight loss and portion control Bipolar Previous L THR 02/17/2018 Chronic diplopia left eye repair prior anemia chronic disease   Eliquis inpatient pending resolution work-up getting chest x-ray in addition to shoulder and ultrasound abdomen pelvis which are still pending needs to be inpatient Given he has failed in the outpatient setting probably would benefit from skilled placement doubt he is safe to  discharge home   [place], MD  [facility] Direct contact: [phone] --Via amion app OR  --[email or web address]; password [email or web address]  7PM-7AM contact night coverage as above 09/05/2018, 2:12 PM  LOS: 0 days   Procedures:  CT of cervical spine head and chest x-ray on 1/3 and 1/4  Antimicrobials:  No  Interval history/Subjective:  Having severe shoulder pain asking for increase in meds is having musculoskeletal pain in that area above the clavicle in the trapezius No fevers does not feel short of breath today  Objective:  Vitals:  Vitals:   09/04/18 2027 09/05/18 0458  BP: 117/60 102/67  Pulse: (!) 101 82  Resp: 20   Temp: 99.1 F (37.3 C) 97.8 F (36.6 C)  SpO2: 96% (!) 89%    Exam:  Awake alert pleasant slightly disheveled Range of motion intact no bicipital groove tenderness empty can test equivocal external rotation with the bicep halfway flexed does not reveal much pain I did not examine with Hawkins or Neer's test she is unable to really to do that while laying in bed Abdomen soft nontender Mild lower extremity swelling   I have personally reviewed the following:  DATA   Labs:  Be met relatively normal LFTs normal  Hemoglobin down from 10-8.6  Imaging studies:  None today   Scheduled Meds: . apixaban  5 mg Oral BID  . cycloSPORINE  1 drop Both Eyes BID  . diltiazem  180 mg Oral Daily  . escitalopram  10 mg Oral Daily  . feeding supplement (ENSURE ENLIVE)  237 mL Oral BID BM  . furosemide  20 mg Oral Daily  . gabapentin  100 mg Oral TID  . Melatonin  6 mg  Oral QHS  . metoprolol tartrate  25 mg Oral BID  . metroNIDAZOLE  500 mg Oral BID  . pantoprazole  40 mg Oral Daily  . rOPINIRole  0.25 mg Oral QHS  . senna-docusate  2 tablet Oral BID  . sodium chloride flush  3 mL Intravenous Q12H  . sodium chloride flush  3 mL Intravenous Q12H  . thiamine  100 mg Oral Daily   Continuous Infusions: . sodium chloride      Principal Problem:    Recurrent 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 Active Problems:   Anemia   Prosthetic joint infection of left hip (HCC)   Depression   Chronic diastolic CHF (congestive heart failure) (HCC)   OSA on CPAP   Unspecified atrial fibrillation (HCC)   Physical debility   Pleural effusion, bilateral   LOS: 0 days

## 2018-09-06 ENCOUNTER — Inpatient Hospital Stay (HOSPITAL_COMMUNITY): Payer: Medicare Other

## 2018-09-06 DIAGNOSIS — H532 Diplopia: Secondary | ICD-10-CM | POA: Diagnosis present

## 2018-09-06 DIAGNOSIS — Z7901 Long term (current) use of anticoagulants: Secondary | ICD-10-CM | POA: Diagnosis not present

## 2018-09-06 DIAGNOSIS — M545 Low back pain: Secondary | ICD-10-CM | POA: Diagnosis present

## 2018-09-06 DIAGNOSIS — R5381 Other malaise: Secondary | ICD-10-CM | POA: Diagnosis present

## 2018-09-06 DIAGNOSIS — D638 Anemia in other chronic diseases classified elsewhere: Secondary | ICD-10-CM | POA: Diagnosis present

## 2018-09-06 DIAGNOSIS — I4891 Unspecified atrial fibrillation: Secondary | ICD-10-CM | POA: Diagnosis not present

## 2018-09-06 DIAGNOSIS — I110 Hypertensive heart disease with heart failure: Secondary | ICD-10-CM | POA: Diagnosis present

## 2018-09-06 DIAGNOSIS — M316 Other giant cell arteritis: Secondary | ICD-10-CM | POA: Diagnosis present

## 2018-09-06 DIAGNOSIS — K760 Fatty (change of) liver, not elsewhere classified: Secondary | ICD-10-CM | POA: Diagnosis present

## 2018-09-06 DIAGNOSIS — I5032 Chronic diastolic (congestive) heart failure: Secondary | ICD-10-CM | POA: Diagnosis present

## 2018-09-06 DIAGNOSIS — J948 Other specified pleural conditions: Secondary | ICD-10-CM | POA: Diagnosis present

## 2018-09-06 DIAGNOSIS — G4733 Obstructive sleep apnea (adult) (pediatric): Secondary | ICD-10-CM | POA: Diagnosis present

## 2018-09-06 DIAGNOSIS — Z87891 Personal history of nicotine dependence: Secondary | ICD-10-CM | POA: Diagnosis not present

## 2018-09-06 DIAGNOSIS — D649 Anemia, unspecified: Secondary | ICD-10-CM | POA: Diagnosis not present

## 2018-09-06 DIAGNOSIS — R0602 Shortness of breath: Secondary | ICD-10-CM | POA: Diagnosis present

## 2018-09-06 DIAGNOSIS — Y831 Surgical operation with implant of artificial internal device as the cause of abnormal reaction of the patient, or of later complication, without mention of misadventure at the time of the procedure: Secondary | ICD-10-CM | POA: Diagnosis present

## 2018-09-06 DIAGNOSIS — J918 Pleural effusion in other conditions classified elsewhere: Secondary | ICD-10-CM | POA: Diagnosis present

## 2018-09-06 DIAGNOSIS — M19011 Primary osteoarthritis, right shoulder: Secondary | ICD-10-CM | POA: Diagnosis present

## 2018-09-06 DIAGNOSIS — K219 Gastro-esophageal reflux disease without esophagitis: Secondary | ICD-10-CM | POA: Diagnosis present

## 2018-09-06 DIAGNOSIS — I480 Paroxysmal atrial fibrillation: Secondary | ICD-10-CM | POA: Diagnosis present

## 2018-09-06 DIAGNOSIS — R296 Repeated falls: Secondary | ICD-10-CM | POA: Diagnosis present

## 2018-09-06 DIAGNOSIS — J90 Pleural effusion, not elsewhere classified: Secondary | ICD-10-CM | POA: Diagnosis not present

## 2018-09-06 DIAGNOSIS — Z6841 Body Mass Index (BMI) 40.0 and over, adult: Secondary | ICD-10-CM | POA: Diagnosis not present

## 2018-09-06 DIAGNOSIS — Z599 Problem related to housing and economic circumstances, unspecified: Secondary | ICD-10-CM | POA: Diagnosis not present

## 2018-09-06 DIAGNOSIS — K7460 Unspecified cirrhosis of liver: Secondary | ICD-10-CM | POA: Diagnosis present

## 2018-09-06 DIAGNOSIS — F319 Bipolar disorder, unspecified: Secondary | ICD-10-CM | POA: Diagnosis present

## 2018-09-06 DIAGNOSIS — G8929 Other chronic pain: Secondary | ICD-10-CM | POA: Diagnosis present

## 2018-09-06 DIAGNOSIS — F418 Other specified anxiety disorders: Secondary | ICD-10-CM | POA: Diagnosis present

## 2018-09-06 LAB — CBC WITH DIFFERENTIAL/PLATELET
Abs Immature Granulocytes: 0.03 10*3/uL (ref 0.00–0.07)
Basophils Absolute: 0 10*3/uL (ref 0.0–0.1)
Basophils Relative: 0 %
Eosinophils Absolute: 0.4 10*3/uL (ref 0.0–0.5)
Eosinophils Relative: 6 %
HCT: 32.2 % — ABNORMAL LOW (ref 39.0–52.0)
Hemoglobin: 9.4 g/dL — ABNORMAL LOW (ref 13.0–17.0)
Immature Granulocytes: 0 %
Lymphocytes Relative: 22 %
Lymphs Abs: 1.5 10*3/uL (ref 0.7–4.0)
MCH: 26.9 pg (ref 26.0–34.0)
MCHC: 29.2 g/dL — ABNORMAL LOW (ref 30.0–36.0)
MCV: 92.3 fL (ref 80.0–100.0)
Monocytes Absolute: 1.1 10*3/uL — ABNORMAL HIGH (ref 0.1–1.0)
Monocytes Relative: 16 %
Neutro Abs: 3.9 10*3/uL (ref 1.7–7.7)
Neutrophils Relative %: 56 %
Platelets: 240 10*3/uL (ref 150–400)
RBC: 3.49 MIL/uL — ABNORMAL LOW (ref 4.22–5.81)
RDW: 17.9 % — ABNORMAL HIGH (ref 11.5–15.5)
WBC: 6.9 10*3/uL (ref 4.0–10.5)
nRBC: 0 % (ref 0.0–0.2)

## 2018-09-06 LAB — COMPREHENSIVE METABOLIC PANEL
ALT: 8 U/L (ref 0–44)
AST: 12 U/L — ABNORMAL LOW (ref 15–41)
Albumin: 2.7 g/dL — ABNORMAL LOW (ref 3.5–5.0)
Alkaline Phosphatase: 47 U/L (ref 38–126)
Anion gap: 8 (ref 5–15)
BUN: 23 mg/dL (ref 8–23)
CO2: 29 mmol/L (ref 22–32)
Calcium: 8.8 mg/dL — ABNORMAL LOW (ref 8.9–10.3)
Chloride: 105 mmol/L (ref 98–111)
Creatinine, Ser: 0.96 mg/dL (ref 0.61–1.24)
GFR calc Af Amer: >60 mL/min (ref >60)
GFR calc non Af Amer: >60 mL/min (ref >60)
Glucose, Bld: 108 mg/dL — ABNORMAL HIGH (ref 70–99)
Potassium: 4.3 mmol/L (ref 3.5–5.1)
Sodium: 142 mmol/L (ref 135–145)
Total Bilirubin: 0.9 mg/dL (ref 0.3–1.2)
Total Protein: 5.4 g/dL — ABNORMAL LOW (ref 6.5–8.1)

## 2018-09-06 LAB — PH, BODY FLUID: pH, Body Fluid: 7.6

## 2018-09-06 LAB — PROTIME-INR
INR: 1.7
Prothrombin Time: 19.8 seconds — ABNORMAL HIGH (ref 11.4–15.2)

## 2018-09-06 LAB — PATHOLOGIST SMEAR REVIEW

## 2018-09-06 MED ORDER — FUROSEMIDE 40 MG PO TABS
40.0000 mg | ORAL_TABLET | Freq: Every day | ORAL | Status: DC
  Administered 2018-09-06 – 2018-09-09 (×4): 40 mg via ORAL
  Filled 2018-09-06 (×4): qty 1

## 2018-09-06 MED ORDER — METOPROLOL TARTRATE 25 MG PO TABS
25.0000 mg | ORAL_TABLET | Freq: Three times a day (TID) | ORAL | Status: DC
  Administered 2018-09-06 – 2018-09-10 (×12): 25 mg via ORAL
  Filled 2018-09-06 (×12): qty 1

## 2018-09-06 MED ORDER — SPIRONOLACTONE 25 MG PO TABS
100.0000 mg | ORAL_TABLET | Freq: Every day | ORAL | Status: DC
  Administered 2018-09-06 – 2018-09-09 (×4): 100 mg via ORAL
  Filled 2018-09-06 (×4): qty 4

## 2018-09-06 NOTE — Care Management Obs Status (Signed)
[facility] NOTIFICATION   Patient Details  Name: [name] MRN: [id number] Date of Birth: 09-30-1945   Medicare Observation Status Notification Given:  Yes    Purcell Mouton, RN 09/06/2018, 1:04 PM

## 2018-09-06 NOTE — Progress Notes (Signed)
[facility] PROGRESS NOTE  [name] [id number] DOB: 1946/08/05 DOA: 09/03/2018 PCP: [name], MD   Narrative: 73 year old male hospitalized recently 12/17-1/2 known A. fib on Eliquis giant cell arteritis OSA BiPAP prior total hip arthroplasty left side,  recently in A. fib RVR with a right-sided effusion that was tapped he was recommended to go to a skilled facility but was declined for this and had to go home  Presented to the emergency room on 1/4 after just 2 days of being at home with generalized weakness falls and shortness of breath-work-up in the emergency room showed A. fib heart rate 108 and a recurrent right-sided pleural effusion   A & Plan Pleural effusion [transudative] DDX includes heart failure, hypoproteinemia versus hepatic hydrothorax CXR shows re-accumulation fluid -?getting [place]? Not order in system though I put it in  + balance--likely patient drinking too much fluid? Would place foley if no net fluid balance Starting Aldactone 100 daily--continue lasix at 40 daily Right shoulder pain- states that pain is uncontrolled-states was discharged on meds last visit-placing Lidoderm on right shoulder, xr as below of shoulder-Occupational Therapy to see and evaluate and give range of motion exercises Paroxysmal A. Fib [place]i vasc >2 on apixaban Rates in high 110's  metoprolol 25 twice daily increased to 25 tid, Cardizem 180  OSa on cpap compliant and able to use on his own HTN-Cardizem metoprolol as above Giant cell arteritis history of-stable not on steroids at this time Morbid obesity with weight loss and BMI from 54.8-46.5 over the past year Congratulated on weight loss and portion control Bipolar Previous L THR 02/17/2018 Chronic diplopia left eye repair prior anemia chronic disease   Eliquis inpatient pending resolution work-await [place] abd pelvis Need neg fluid balance  Need CSW input- do not feel safe for homwe on d/c Needs ongoign education and  supervision on d/c   Kemp Gomes, MD  [facility] Direct contact: [phone] --Via amion app OR  --[email or web address]; password [email or web address]  7PM-7AM contact night coverage as above 09/06/2018, 11:39 AM  LOS: 0 days   Procedures:  CT of cervical spine head and chest x-ray on 1/3 and 1/4  Antimicrobials:  No  Interval history/Subjective:  Pain controlled eating breakfast Unclear what happened with my order of abd [place] No cp no fever   Objective:  Vitals:  Vitals:   09/05/18 2206 09/06/18 0604  BP: 102/67 116/80  Pulse: 98 84  Resp: 18 18  Temp: 98.7 F (37.1 C) 97.8 F (36.6 C)  SpO2: 94% 93%    Exam:  Awake alert in nad No cough no fever no chills  eomi thick neck No rales no rhonci-pateitn seated to poor exam abd obese slight swelling of LE's Neuro is intact   I have personally reviewed the following:  DATA   Labs:  Bun/creat 23/0.9 up from 19/0.9--LFTs low  Hemoglobin down from 10-8.6-->9.3  Imaging studies:  R shoudler xy 1/5=arthritis  CXR shows worsening fluid compared to prior and recurrent effusion   Scheduled Meds: . apixaban  5 mg Oral BID  . cycloSPORINE  1 drop Both Eyes BID  . diltiazem  180 mg Oral Daily  . escitalopram  10 mg Oral Daily  . feeding supplement (ENSURE ENLIVE)  237 mL Oral BID BM  . furosemide  40 mg Oral Daily  . gabapentin  100 mg Oral TID  . lidocaine  1 patch Transdermal Q24H  . Melatonin  6 mg Oral QHS  . metoprolol tartrate  25 mg Oral  BID  . metroNIDAZOLE  500 mg Oral BID  . pantoprazole  40 mg Oral Daily  . rOPINIRole  0.25 mg Oral QHS  . senna-docusate  2 tablet Oral BID  . sodium chloride flush  3 mL Intravenous Q12H  . sodium chloride flush  3 mL Intravenous Q12H  . spironolactone  100 mg Oral Daily  . thiamine  100 mg Oral Daily   Continuous Infusions: . sodium chloride      Principal Problem:   Recurrent 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 Active Problems:   Anemia   Prosthetic joint infection of left  hip (HCC)   Depression   Chronic diastolic CHF (congestive heart failure) (HCC)   OSA on CPAP   Unspecified atrial fibrillation (HCC)   Physical debility   Pleural effusion, bilateral   LOS: 0 days

## 2018-09-06 NOTE — Progress Notes (Addendum)
Per SNF, the patient has exhausted all one hundred SNF rehab days alloted by [employer] therefore they will not pay for SNF placement.   The patient will need a sixty day Wellness period(No hospital stay) before the insurance will cover his stay at SNF again. Patient will need to pay privately for SNF placement until then.    Patient is not willing to pay privately at this time. CSW sister Joaquim Lai reports she is concern about the patient and plans to visit tommorrow and discuss using his personal funds to pay for SNF.   Kathrin Greathouse, Marlinda Mike, MSW Clinical Social Worker  [phone] 09/06/2018  3:36 PM

## 2018-09-07 LAB — BODY FLUID CULTURE: Culture: NO GROWTH

## 2018-09-07 LAB — COMPREHENSIVE METABOLIC PANEL
ALT: 8 U/L (ref 0–44)
AST: 12 U/L — ABNORMAL LOW (ref 15–41)
Albumin: 2.6 g/dL — ABNORMAL LOW (ref 3.5–5.0)
Alkaline Phosphatase: 43 U/L (ref 38–126)
Anion gap: 7 (ref 5–15)
BUN: 23 mg/dL (ref 8–23)
CO2: 29 mmol/L (ref 22–32)
Calcium: 8.9 mg/dL (ref 8.9–10.3)
Chloride: 105 mmol/L (ref 98–111)
Creatinine, Ser: 0.96 mg/dL (ref 0.61–1.24)
GFR calc Af Amer: >60 mL/min (ref >60)
GFR calc non Af Amer: >60 mL/min (ref >60)
Glucose, Bld: 103 mg/dL — ABNORMAL HIGH (ref 70–99)
Potassium: 4.2 mmol/L (ref 3.5–5.1)
Sodium: 141 mmol/L (ref 135–145)
Total Bilirubin: 0.4 mg/dL (ref 0.3–1.2)
Total Protein: 5.4 g/dL — ABNORMAL LOW (ref 6.5–8.1)

## 2018-09-07 NOTE — Evaluation (Signed)
Physical Therapy Evaluation Patient Details Name: [name] MRN: [id number] DOB: 04/18/1946 Today's Date: 09/07/2018   History of Present Illness  Pt is a 73 year old male with PMHx significant for HTN, afib on Eliquis, OSA, morbid obesity, L THA and hip infection and history of multiple recent prolonged hospitalizations.  Pt presented to ED with generalized weakness, falls, and shortness of breath- work-up in the emergency room showed A. fib heart rate 108 and a recurrent right-sided pleural effusion  Clinical Impression  Pt admitted with above diagnosis. Pt currently with functional limitations due to the deficits listed below (see PT Problem List).  Pt will benefit from skilled PT to increase their independence and safety with mobility to allow discharge to the venue listed below.   Pt reports fall and increased weakness upon d/c home from recent admission and quickly readmitted.  SNF was recommended upon last hospitalization however pt discharged home.  Pt requires significant assist for mobility at this time and recommend SNF upon d/c as pt from home alone.     Follow Up Recommendations SNF    Equipment Recommendations  None recommended by PT    Recommendations for Other Services       Precautions / Restrictions Precautions Precautions: Fall Restrictions Weight Bearing Restrictions: No      Mobility  Bed Mobility Overal bed mobility: Needs Assistance Bed Mobility: Supine to Sit;Sit to Supine     Supine to sit: Mod assist;+2 for safety/equipment;+2 for physical assistance Sit to supine: +2 for physical assistance;+2 for safety/equipment;Mod assist   General bed mobility comments: assist for trunk upright and LEs onto bed  Transfers Overall transfer level: Needs assistance Equipment used: Rolling walker (2 wheeled) Transfers: Sit to/from Stand Sit to Stand: Mod assist;+2 physical assistance;+2 safety/equipment;From elevated surface         General transfer  comment: Assist to rise, stabilize, control descent, Increased time.   Ambulation/Gait Ambulation/Gait assistance: Min assist;+2 safety/equipment Gait Distance (Feet): 12 Feet Assistive device: Rolling walker (2 wheeled) Gait Pattern/deviations: Wide base of support;Trunk flexed;Step-through pattern;Decreased stride length     General Gait Details: assist for stability, slow speed, fatigued quickly, cues for RW positioning and posture; HR increased to 134 bpm, 98 bpm end of session (RN notified)  Stairs            Wheelchair Mobility    Modified Rankin (Stroke Patients Only)       Balance           Standing balance support: Bilateral upper extremity supported Standing balance-[name]: Poor Standing balance comment: requires UE support and external assist                             Pertinent Vitals/Pain Pain Assessment: Faces Faces Pain Scale: Hurts even more Pain Location: back Pain Descriptors / Indicators: Discomfort;Grimacing Pain Intervention(s): Monitored during session;Repositioned    Home Living   Living Arrangements: Alone   Type of Home: House Home Access: Ramped entrance     Home Layout: One level Home Equipment: Walker - 2 wheels;Wheelchair - [employer];Hospital bed      Prior Function Level of Independence: Needs assistance   Gait / Transfers Assistance Needed: pt quickly readmitted after previous hospitalization, was ambulating short distance with RW and PT, declined SNF and went home           Hand Dominance        Extremity/Trunk Assessment   Upper Extremity  Assessment Upper Extremity Assessment: Generalized weakness    Lower Extremity Assessment Lower Extremity Assessment: Generalized weakness       Communication   Communication: No difficulties  Cognition Arousal/Alertness: Awake/alert Behavior During Therapy: WFL for tasks assessed/performed Overall Cognitive Status: Within Functional  Limits for tasks assessed                                        General Comments      Exercises     Assessment/Plan    PT Assessment Patient needs continued PT services  PT Problem List Decreased strength;Decreased mobility;Decreased activity tolerance;Decreased balance;Decreased knowledge of use of DME;Pain       PT Treatment Interventions DME instruction;Functional mobility training;Balance training;Patient/family education;Gait training;Therapeutic activities;Therapeutic exercise;Wheelchair mobility training    PT Goals (Current goals can be found in the Care Plan section)  Acute Rehab PT Goals PT Goal Formulation: With patient Time For Goal Achievement: 09/21/18 Potential to Achieve Goals: Good    Frequency Min 2X/week   Barriers to discharge        Co-evaluation               AM-PAC PT "6 Clicks" Mobility  Outcome Measure Help needed turning from your back to your side while in a flat bed without using bedrails?: A Lot Help needed moving from lying on your back to sitting on the side of a flat bed without using bedrails?: A Lot Help needed moving to and from a bed to a chair (including a wheelchair)?: A Lot Help needed standing up from a chair using your arms (e.g., wheelchair or bedside chair)?: A Lot Help needed to walk in hospital room?: A Lot Help needed climbing 3-5 steps with a railing? : Total 6 Click Score: 11    End of Session Equipment Utilized During Treatment: Gait belt Activity Tolerance: Patient limited by fatigue Patient left: in bed;with call bell/phone within reach Nurse Communication: Mobility status PT Visit Diagnosis: History of falling (Z91.81);Unsteadiness on feet (R26.81);Muscle weakness (generalized) (M62.81);Difficulty in walking, not elsewhere classified (R26.2)    Time: 3361-2244 PT Time Calculation (min) (ACUTE ONLY): 17 min   Charges:   PT Evaluation $PT Eval Low Complexity: [place],  PT, DPT Acute Rehabilitation Services Office: [phone] Pager: [phone]   Trena Platt 09/07/2018, 12:18 PM

## 2018-09-07 NOTE — Progress Notes (Addendum)
[facility] PROGRESS NOTE  [name] [id number] DOB: July 14, 1946 DOA: 09/03/2018 PCP: [name], MD   Narrative: 73 year old male hospitalized recently 12/17-1/2 known A. fib on Eliquis giant cell arteritis OSA BiPAP prior total hip arthroplasty left side,  recently in A. fib RVR with a right-sided effusion that was tapped he was recommended to go to a skilled facility but was declined for this and had to go home  Presented to the emergency room on 1/4 after just 2 days of being at home with generalized weakness falls and shortness of breath-work-up in the emergency room showed A. 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 Fib [place] vasc >2 on apixaban metoprolol 25 twice daily increased to 25 tid, Cardizem 180  Rates slight better-continue to monitor on tele for 24-48 hours and if stable d/c monitors OSa on cpap compliant and able to use on his own HTN-Cardizem metoprolol as above Giant cell arteritis history of-stable not on steroids at this time Morbid obesity with weight loss and BMI from 54.8-46.5 over the past year Congratulated on weight loss and portion control Educated on Cirrhosis and association with NAFLD Bipolar Previous L THR 02/17/2018 Chronic diplopia left eye repair prior anemia chronic disease  Eliquis inpatient pending resolution work-await [place] abd  pelvis Need neg fluid balance  Need CSW input- do not feel safe for homwe on d/c Needs ongoing education and supervision on d/c   Frank Sangiovanni, MD  [facility] Direct contact: [phone] --Via amion app OR  --[email or web address]; password [email or web address]  7PM-7AM contact night coverage as above 09/07/2018, 5:49 PM  LOS: 1 day    Interval history/Subjective:  Awake alert better no cp Less swollen no fever no chills Overall about the same  Objective:  Vitals:  Vitals:   09/07/18 0445 09/07/18 1410  BP: 112/63 109/62  Pulse: 96 85  Resp:  18  Temp: 97.6 F (36.4 C) 98 F (36.7 C)  SpO2: 96% 97%    Exam:  Awake alert in nad No cough no fever no chills  eomi thick neck No rales no rhonci-no rales abd obese slight swelling of LE's Neuro is intact   I have personally reviewed the following:  DATA   Labs:  Bun/creat 23/0.9 and overall stable  I/o neg 1.5 liters  Imaging studies:  R shoudler xy 1/5=arthritis  CXR shows worsening fluid compared to prior and recurrent effusion  CT of cervical spine head and chest x-ray on 1/3 and 1/4  US liver IMPRESSION: Cirrhosis.  No focal hepatic lesion is seen. Status post cholecystectomy. Dilated common duct, measuring 20 mm, chronic. Mild intrahepatic ductal dilatation, chronic. Right pleural effusion.    Scheduled Meds: . apixaban  5 mg Oral BID  . cycloSPORINE  1 drop Both Eyes BID  . diltiazem  180 mg Oral Daily  . escitalopram  10 mg Oral Daily  . feeding supplement (ENSURE ENLIVE)  237 mL Oral BID BM  . furosemide  40 mg Oral Daily  . gabapentin  100 mg Oral TID  . lidocaine  1 patch Transdermal Q24H  . Melatonin  6 mg Oral QHS  . metoprolol tartrate  25 mg Oral TID  . metroNIDAZOLE  500 mg Oral BID  . pantoprazole  40 mg Oral Daily  . rOPINIRole  0.25 mg Oral QHS  . senna-docusate  2 tablet Oral BID  . sodium chloride flush  3 mL Intravenous Q12H  . sodium chloride flush  3 mL Intravenous Q12H  .  spironolactone  100 mg Oral Daily  . thiamine  100 mg Oral Daily   Continuous Infusions: . sodium chloride      Principal Problem:   Recurrent 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 Active Problems:   Anemia   Prosthetic joint infection of left hip (HCC)   Depression   Chronic diastolic CHF (congestive heart failure) (HCC)   OSA on CPAP   Unspecified atrial fibrillation (HCC)   Physical debility   Pleural effusion, bilateral   LOS: 1 day

## 2018-09-08 ENCOUNTER — Inpatient Hospital Stay (HOSPITAL_COMMUNITY): Payer: Medicare Other

## 2018-09-08 LAB — COMPREHENSIVE METABOLIC PANEL
ALT: 8 U/L (ref 0–44)
AST: 13 U/L — ABNORMAL LOW (ref 15–41)
Albumin: 2.6 g/dL — ABNORMAL LOW (ref 3.5–5.0)
Alkaline Phosphatase: 44 U/L (ref 38–126)
Anion gap: 9 (ref 5–15)
BUN: 24 mg/dL — ABNORMAL HIGH (ref 8–23)
CO2: 27 mmol/L (ref 22–32)
Calcium: 8.8 mg/dL — ABNORMAL LOW (ref 8.9–10.3)
Chloride: 104 mmol/L (ref 98–111)
Creatinine, Ser: 0.89 mg/dL (ref 0.61–1.24)
GFR calc Af Amer: >60 mL/min (ref >60)
GFR calc non Af Amer: >60 mL/min (ref >60)
Glucose, Bld: 110 mg/dL — ABNORMAL HIGH (ref 70–99)
Potassium: 4.2 mmol/L (ref 3.5–5.1)
Sodium: 140 mmol/L (ref 135–145)
Total Bilirubin: 0.6 mg/dL (ref 0.3–1.2)
Total Protein: 5.3 g/dL — ABNORMAL LOW (ref 6.5–8.1)

## 2018-09-08 LAB — HEPATITIS PANEL, ACUTE
HCV Ab: <0.1 s/co ratio (ref 0.0–0.9)
Hep A IgM: NEGATIVE
Hep B C IgM: NEGATIVE
Hepatitis B Surface Ag: NEGATIVE

## 2018-09-08 LAB — HEPATITIS B SURFACE ANTIGEN: Hepatitis B Surface Ag: NEGATIVE

## 2018-09-08 MED ORDER — MORPHINE SULFATE (PF) 2 MG/ML IV SOLN
2.0000 mg | Freq: Once | INTRAVENOUS | Status: AC
  Administered 2018-09-08: 2 mg via INTRAVENOUS
  Filled 2018-09-08: qty 1

## 2018-09-08 NOTE — Progress Notes (Signed)
[facility] PROGRESS NOTE  EBUBECHUKWU JEDLICKA OMV:[id number] DOB: 01-27-1946 DOA: 09/03/2018 PCP: [name], MD   Narrative: 73 year old male hospitalized recently 12/17-1/2 known A. fib on Eliquis giant cell arteritis OSA BiPAP prior total hip arthroplasty left side,  recently in A. fib RVR with a right-sided effusion that was tapped he was recommended to go to a skilled facility but was declined for this and had to go home  Presented to the emergency room on 1/4 after just 2 days of being at home with generalized weakness falls and shortness of breath-work-up in the emergency room showed A. 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  Patient is saturating normal on room air.  ?NAFLD +Cirrhosis Hepatitis panel was negative.  Patient will need further work-up preferably in the outpatient setting.  He will need follow-up with gastroenterology.  Right shoulder pain- Arthritis was noted on x-ray.  Pain better controlled with Lidoderm.    Paroxysmal A. Fib [place] vasc >2 on apixaban metoprolol 25 twice daily increased to 25 tid, Cardizem 180   OSa on cpap compliant and able to use on his own  Essential hypertension  Cardizem metoprolol as above  Giant cell arteritis Stable not on steroids at this time  Morbid obesity with weight loss and BMI from 54.8-46.5 over the past year Congratulated on weight loss and portion control Educated on Cirrhosis and association with NAFLD  Bipolar Previous L THR 02/17/2018 Chronic diplopia left eye repair prior anemia chronic disease  Patient remains medically stable.  Disposition is an issue.  PT has recommended  skilled nursing facility however patient unable to pay privately.  He states that he is unsafe to go home.  Social worker to readdress.   Subjective: Patient upset that he is unable to go to skilled nursing facility.  He understands that he will have to pay privately.  Objective:  Vitals:  Vitals:   09/08/18 0444 09/08/18 1309  BP: 104/65 115/61  Pulse: 88 78  Resp: 14 18  Temp: 97.8 F (36.6 C)   SpO2: 95% 97%    Exam:  General appearance: Awake alert.  In no distress Resp: Normal effort at rest.  Clear to auscultation bilaterally. Cardio: S1-S2 is normal regular.  No S3-S4.  No rubs murmurs or bruit GI: Abdomen is soft.  Nontender nondistended.  Bowel sounds are present normal.  No masses organomegaly Extremities: No edema.  Full range of motion of lower extremities. Neurologic: Alert and oriented x3.  No focal neurological deficits.     I have personally reviewed the following:  DATA   Labs:  Creatinine 0.89.  Glucose 110.  Other electrolytes normal.  LFTs normal.  Imaging studies:  R shoudler xy 1/5=arthritis  CXR shows worsening fluid compared to prior and recurrent effusion  CT of cervical spine head and chest x-ray on 1/3 and 1/4  US liver IMPRESSION: Cirrhosis.  No focal hepatic lesion is seen. Status post cholecystectomy. Dilated common duct, measuring 20 mm, chronic. Mild intrahepatic ductal dilatation, chronic. Right pleural effusion.    Scheduled Meds: . apixaban  5 mg Oral BID  . cycloSPORINE  1 drop Both Eyes BID  . diltiazem  180 mg Oral Daily  . escitalopram  10 mg Oral Daily  . feeding supplement (ENSURE ENLIVE)  237 mL Oral BID BM  . furosemide  40 mg Oral Daily  . gabapentin  100 mg Oral TID  . lidocaine  1 patch Transdermal Q24H  . Melatonin  6 mg Oral QHS  . metoprolol tartrate  25 mg Oral TID  . metroNIDAZOLE  500 mg Oral BID  . pantoprazole  40 mg Oral Daily  . rOPINIRole  0.25 mg Oral QHS  . senna-docusate  2 tablet Oral BID   . sodium chloride flush  3 mL Intravenous Q12H  . sodium chloride flush  3 mL Intravenous Q12H  . spironolactone  100 mg Oral Daily  . thiamine  100 mg Oral Daily   Continuous Infusions: . sodium chloride        LOS: 2 days   Bonnielee Haff

## 2018-09-09 ENCOUNTER — Inpatient Hospital Stay (HOSPITAL_COMMUNITY): Payer: Medicare Other

## 2018-09-09 DIAGNOSIS — K7460 Unspecified cirrhosis of liver: Secondary | ICD-10-CM

## 2018-09-09 MED ORDER — FUROSEMIDE 40 MG PO TABS
40.0000 mg | ORAL_TABLET | Freq: Two times a day (BID) | ORAL | Status: DC
  Administered 2018-09-09 – 2018-09-10 (×2): 40 mg via ORAL
  Filled 2018-09-09 (×2): qty 1

## 2018-09-09 MED ORDER — SPIRONOLACTONE 25 MG PO TABS
100.0000 mg | ORAL_TABLET | Freq: Two times a day (BID) | ORAL | Status: DC
  Administered 2018-09-09 – 2018-09-10 (×2): 100 mg via ORAL
  Filled 2018-09-09 (×2): qty 4

## 2018-09-09 MED ORDER — ALUM & MAG HYDROXIDE-SIMETH 200-200-20 MG/5ML PO SUSP
30.0000 mL | ORAL | Status: DC | PRN
  Administered 2018-09-09: 30 mL via ORAL
  Filled 2018-09-09: qty 30

## 2018-09-09 NOTE — Progress Notes (Signed)
[facility] PROGRESS NOTE  [name] IDP:[id number] DOB: [birth year] DOA: 09/03/2018  PCP: [name], MD  Brief History/Interval Summary: 73 year old male hospitalized recently 12/17-1/2 known A. fib on Eliquis giant cell arteritis OSA BiPAP prior total hip arthroplasty left side,  recently in A. fib RVR with a right-sided effusion that was tapped he was recommended to go to a skilled facility but was declined for this and had to go home  Presented to the emergency room on 1/4 after just 2 days of being at home with generalized weakness falls and shortness of breath-work-up in the emergency room showed A. fib heart rate 108 and a recurrent right-sided pleural effusion.  Reason for Visit: Recurrent pleural effusion  Consultants: None  Procedures: Thoracentesis 1/4  Antibiotics: Chronically on metronidazole  Subjective/Interval History: Patient states that he is feeling more short of breath than yesterday.  Especially when he ambulates.  He is concerned that the fluid is reaccumulating in his lungs.  Denies any chest pain.  ROS: Denies any nausea or vomiting  Objective:  Vital Signs  Vitals:   09/08/18 2013 09/08/18 2112 09/09/18 0455 09/09/18 0600  BP:  (!) 109/58 (!) 80/41 119/71  Pulse: 72 97 96 90  Resp: 17  20   Temp:  98.7 F (37.1 C) (!) 97.4 F (36.3 C)   TempSrc:  Oral Axillary   SpO2: 99% 96% 96% 92%  Weight:   133.6 kg   Height:        Intake/Output Summary (Last 24 hours) at 09/09/2018 1240 Last data filed at 09/09/2018 1000 Gross per 24 hour  Intake 900 ml  Output 1800 ml  Net -900 ml   Filed Weights   09/05/18 0500 09/06/18 0452 09/09/18 0455  Weight: (!) 138.8 kg 135.5 kg 133.6 kg    General appearance: Awake alert.  In no distress Resp: Diminished air entry noted on the right.  No crackles or wheezing appreciated.  No rhonchi.  Normal effort at rest. Cardio: S1-S2 is normal regular.  No S3-S4.  No rubs murmurs or bruit GI: Abdomen  is soft.  Nontender nondistended.  Bowel sounds are present normal.  No masses organomegaly Extremities: No edema.  Full range of motion of lower extremities. Neurologic: Alert and oriented x3.  No focal neurological deficits.    Lab Results:  Data Reviewed: I have personally reviewed following labs and imaging studies  CBC: Recent Labs  Lab 09/03/18 1959 09/04/18 0447 09/05/18 0516 09/06/18 0509  WBC 8.5 6.9 6.2 6.9  NEUTROABS 6.2 4.5 3.8 3.9  HGB 10.2* 9.7* 8.6* 9.4*  HCT 34.8* 32.7* 29.3* 32.2*  MCV 90.4 89.1 89.6 92.3  PLT 315 291 275 443    Basic Metabolic Panel: Recent Labs  Lab 09/04/18 0447 09/05/18 0516 09/06/18 0509 09/07/18 0453 09/08/18 0407  NA 143 142 142 141 140  K 3.6 3.8 4.3 4.2 4.2  CL 106 105 105 105 104  CO2 28 28 29 29 27   GLUCOSE 128* 105* 108* 103* 110*  BUN 18 19 23 23  24*  CREATININE 0.93 0.92 0.96 0.96 0.89  CALCIUM 9.3 8.7* 8.8* 8.9 8.8*    GFR: Estimated Creatinine Clearance: 100.3 mL/min (by C-G formula based on SCr of 0.89 mg/dL).  Liver Function Tests: Recent Labs  Lab 09/03/18 1959 09/05/18 0516 09/06/18 0509 09/07/18 0453 09/08/18 0407  AST 17 13* 12* 12* 13*  ALT 9 8 8 8 8   ALKPHOS 60 46 47 43 44  BILITOT 0.3 0.8 0.9 0.4  0.6  PROT 6.3* 5.3* 5.4* 5.4* 5.3*  ALBUMIN 2.8* 2.7* 2.7* 2.6* 2.6*     Coagulation Profile: Recent Labs  Lab 09/06/18 0509  INR 1.70    Cardiac Enzymes: Recent Labs  Lab 09/03/18 1959  TROPONINI <0.03     Recent Results (from the past 240 hour(s))  Body fluid culture     Status: None   Collection Time: 09/04/18 10:38 AM  Result Value Ref Range Status   Specimen Description   Final    PLEURAL Performed at [facility] [address] [place], [place] [address]    Special Requests   Final    NONE Performed at [facility], [place] [address] [place], [place] [address]    Gram Stain   Final    CYTOSPIN SMEAR WBC PRESENT,BOTH PMN AND  MONONUCLEAR NO ORGANISMS SEEN    Culture   Final    NO GROWTH 3 DAYS Performed at [facility], [place] [address], [place], [place] [address]    Report Status 09/07/2018 FINAL  Final  Acid Fast Smear (AFB)     Status: None   Collection Time: 09/04/18 10:38 AM  Result Value Ref Range Status   AFB Specimen Processing Concentration  Final   Acid Fast Smear Negative  Final    Comment: (NOTE) Performed At: [facility] [place], [place] [id number] Rush Farmer MD TI:[id number]    Source (AFB) PLEURAL  Final    Comment: Performed at [facility], [place] [address], [place],  [address]      Radiology Studies: Dg Chest 2 View  Result Date: 09/08/2018 CLINICAL DATA:  Recurrent pleural effusion on the right EXAM: CHEST - 2 VIEW COMPARISON:  Three days ago FINDINGS: Unchanged small to moderate right pleural effusion that appears to be layering. Unchanged pleural thickening on the left. Chronic cardiomegaly. Stable interstitial coarsening. IMPRESSION: 1. Unchanged small to moderate right pleural effusion. 2. Stable pleural thickening on the left. Electronically Signed   By: Monte Fantasia M.D.   On: 09/08/2018 08:38     Medications:  Scheduled: . apixaban  5 mg Oral BID  . cycloSPORINE  1 drop Both Eyes BID  . diltiazem  180 mg Oral Daily  . escitalopram  10 mg Oral Daily  . feeding supplement (ENSURE ENLIVE)  237 mL Oral BID BM  . furosemide  40 mg Oral Daily  . gabapentin  100 mg Oral TID  . lidocaine  1 patch Transdermal Q24H  . Melatonin  6 mg Oral QHS  . metoprolol tartrate  25 mg Oral TID  . metroNIDAZOLE  500 mg Oral BID  . pantoprazole  40 mg Oral Daily  . rOPINIRole  0.25 mg Oral QHS  . senna-docusate  2 tablet Oral BID  . sodium chloride flush  3 mL Intravenous Q12H  . sodium chloride flush  3 mL Intravenous Q12H  . spironolactone  100 mg Oral Daily  . thiamine  100 mg Oral Daily   Continuous: . sodium chloride      ZJQ:BHALPF chloride, acetaminophen **OR** acetaminophen, albuterol, hydrOXYzine, ondansetron **OR** ondansetron (ZOFRAN) IV, oxyCODONE, polyethylene glycol, polyvinyl alcohol, sodium chloride flush, zolpidem    Assessment/Plan:  Recurrent pleural effusion Seen by pulmonology during previous hospitalization.  Repeat fluid analysis on 1/4 does suggest that this is a transudative process.  Echocardiogram from August shows the patient has normal systolic function.  His imaging studies do suggest hepatic cirrhosis.  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  Patient is on furosemide as well as spironolactone.  He feels this morning that his fluid is reaccumulating.  X-ray was stable from yesterday.  We will repeat x-ray today.  He saturating normal on room air.  Consideration could be given to increasing the dose of his diuretics as he has been on the current dose for at least a week now.  Hepatic cirrhosis Etiology unclear.  Hepatitis panel was negative.  He will need further work-up and follow-up with gastroenterology.  This can be done in the outpatient setting.  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  Lidoderm topically.  Paroxysmal atrial fibrillation Patient is chads 2 vascular score greater than 2.  Patient is on apixaban which is being continued.  Patient is on metoprolol as well as Cardizem.  History of obstructive sleep apnea on CPAP Stable.  History of giant cell arteritis Stable.  Not on steroids at this time.  Morbid obesity He was able to successfully lose weight and BMI went from 54.8-44.  History of bipolar disorder Stable.  Previous left total hip replacement This was done in June (?at [facility]).  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  Chronic diplopia Stable.  Normocytic anemia This is likely due to chronic disease.  No evidence for bleeding.  DVT Prophylaxis: On apixaban    Code Status:  Full code Family Communication: Discussed with the patient Disposition Plan: Disposition has been an issue.  Skilled nursing facility is recommended due to deconditioning status.  However patient has used up all of his skilled nursing facility days with his insurance.  He will have to pay privately.  Patient is very upset with this.  Do not have a safe discharge plan as yet.    LOS: 3 days   [facility] Pager [phone] 09/09/2018, 12:40 PM  If 7PM-7AM, please contact night-coverage at [email or web address], password [facility]

## 2018-09-09 NOTE — Progress Notes (Signed)
Physical Therapy Treatment Patient Details Name: [name] MRN: [id number] DOB: [birth year] Today's Date: 09/09/2018    History of Present Illness Pt is a 73 year old male with PMHx significant for HTN, afib on Eliquis, OSA, morbid obesity, L THA and hip infection and history of multiple recent prolonged hospitalizations.  Pt presented to ED with generalized weakness, falls, and shortness of breath- work-up in the emergency room showed A. fib heart rate 108 and a recurrent right-sided pleural effusion    PT Comments    Pt assisted with ambulating however only tolerated 7 feet due to reported LE weakness which he believes is from his pleural effusion.  SPO2 97% on room.  Pt encouraged to remain in recliner for at least an hour since he states he has not been OOB since last PT session.  Pt also encouraged to at least sit EOB with nursing staff (ie for meals) as he also reports increased back pain with transitional movement.   Follow Up Recommendations  SNF     Equipment Recommendations  None recommended by PT    Recommendations for Other Services       Precautions / Restrictions Precautions Precautions: Fall Restrictions Weight Bearing Restrictions: No    Mobility  Bed Mobility Overal bed mobility: Needs Assistance Bed Mobility: Supine to Sit;Sit to Supine     Supine to sit: Mod assist;+2 for safety/equipment;+2 for physical assistance     General bed mobility comments: assist for trunk upright and scooting to EOB  Transfers Overall transfer level: Needs assistance Equipment used: Rolling walker (2 wheeled) Transfers: Sit to/from Stand Sit to Stand: +2 physical assistance;+2 safety/equipment;From elevated surface;Min assist         General transfer comment: Assist to rise, stabilize, control descent, Increased time.   Ambulation/Gait Ambulation/Gait assistance: Min assist;+2 safety/equipment Gait Distance (Feet): 7 Feet Assistive device: Rolling walker (2  wheeled) Gait Pattern/deviations: Wide base of support;Trunk flexed;Step-through pattern;Decreased stride length Gait velocity: decreased    General Gait Details: assist for stability, slow speed, fatigued very quickly, cues for RW positioning and posture; pt requested recliner to sit down due to LE weakness he believes from his "lung filling again", SpO2 97% on room air   Stairs             Wheelchair Mobility    Modified Rankin (Stroke Patients Only)       Balance Overall balance assessment: Needs assistance         Standing balance support: Bilateral upper extremity supported Standing balance-[name]: Poor Standing balance comment: requires UE support and external assist                            Cognition Arousal/Alertness: Awake/alert Behavior During Therapy: WFL for tasks assessed/performed Overall Cognitive Status: Within Functional Limits for tasks assessed                                        Exercises      General Comments        Pertinent Vitals/Pain Pain Assessment: Faces Faces Pain Scale: Hurts little more Pain Location: back with transitional movements Pain Descriptors / Indicators: Discomfort;Grimacing Pain Intervention(s): Monitored during session;Repositioned    Home Living                      Prior Function  PT Goals (current goals can now be found in the care plan section) Progress towards PT goals: Progressing toward goals    Frequency    Min 2X/week      PT Plan Current plan remains appropriate    Co-evaluation              AM-PAC PT "6 Clicks" Mobility   Outcome Measure  Help needed turning from your back to your side while in a flat bed without using bedrails?: A Lot Help needed moving from lying on your back to sitting on the side of a flat bed without using bedrails?: A Lot Help needed moving to and from a bed to a chair (including a wheelchair)?: A  Lot Help needed standing up from a chair using your arms (e.g., wheelchair or bedside chair)?: A Little Help needed to walk in hospital room?: A Little Help needed climbing 3-5 steps with a railing? : Total 6 Click Score: 13    End of Session Equipment Utilized During Treatment: Gait belt Activity Tolerance: Patient limited by fatigue Patient left: with call bell/phone within reach;in chair Nurse Communication: Mobility status(spoke to RN and requested nursing assist back back to bed in one hour (he fears being left up in recliner too long from recent admissions however needs to be OOB)) PT Visit Diagnosis: History of falling (Z91.81);Unsteadiness on feet (R26.81);Muscle weakness (generalized) (M62.81);Difficulty in walking, not elsewhere classified (R26.2)     Time: 6837-2902 PT Time Calculation (min) (ACUTE ONLY): 19 min  Charges:  $Gait Training: 8-22 mins                     Carmelia Bake, PT, [place] Office: [phone] Pager: [phone]  Trena Platt 09/09/2018, 3:07 PM

## 2018-09-10 LAB — BASIC METABOLIC PANEL
Anion gap: 9 (ref 5–15)
BUN: 23 mg/dL (ref 8–23)
CO2: 29 mmol/L (ref 22–32)
Calcium: 8.9 mg/dL (ref 8.9–10.3)
Chloride: 102 mmol/L (ref 98–111)
Creatinine, Ser: 1.12 mg/dL (ref 0.61–1.24)
GFR calc Af Amer: >60 mL/min (ref >60)
GFR calc non Af Amer: >60 mL/min (ref >60)
Glucose, Bld: 114 mg/dL — ABNORMAL HIGH (ref 70–99)
Potassium: 4.4 mmol/L (ref 3.5–5.1)
Sodium: 140 mmol/L (ref 135–145)

## 2018-09-10 MED ORDER — METOPROLOL TARTRATE 25 MG PO TABS
25.0000 mg | ORAL_TABLET | Freq: Two times a day (BID) | ORAL | Status: DC
  Administered 2018-09-10 – 2018-09-11 (×2): 25 mg via ORAL
  Filled 2018-09-10 (×2): qty 1

## 2018-09-10 MED ORDER — SPIRONOLACTONE 25 MG PO TABS
100.0000 mg | ORAL_TABLET | Freq: Two times a day (BID) | ORAL | Status: DC
  Administered 2018-09-11: 100 mg via ORAL
  Filled 2018-09-10: qty 4

## 2018-09-10 MED ORDER — FUROSEMIDE 40 MG PO TABS
40.0000 mg | ORAL_TABLET | Freq: Two times a day (BID) | ORAL | Status: DC
  Administered 2018-09-11 (×2): 40 mg via ORAL
  Filled 2018-09-10 (×2): qty 1

## 2018-09-10 NOTE — Progress Notes (Signed)
Pt requested to speak with CSW- Pt known to CSW from previous hospital admission. During that admission CSWs worked extensively with pt on disposition- SNF recommended however pt is out of medicare-covered SNF days and has not at 60 day wellness period therefore SNF care would be out of pocket at this point. Due to pt not financially able to pay out of pocket for SNF, returned home.   Upon meeting with CSW today, pt states he received a letter from [facility] that "his appeal of DC was upheld." Pt states, "That means I can go back to [facility]." CSW explained that appeal process does not directly impact his medicare coverage days. As pt still has not had wellness period of 60 days, pt would have to pay for SNF. Pt declines to do this and CSW investigated other options for more support for him at home. Pt was set up to have [facility] with [facility] at previous DC and he would like this to resume. Also states he has some funds for personal caregiver and will begin to look at hiring assistance at home. Pt's ex-wife is main support.  Pt plan is to return home with as much support as possible as he does not have SNF coverage.  Sharren Bridge, MSW, LCSW Clinical Social Work 09/10/2018 [phone]

## 2018-09-10 NOTE — Progress Notes (Signed)
Physical Therapy Treatment Patient Details Name: [name] MRN: [id number] DOB: 01-22-1946 Today's Date: 09/10/2018    History of Present Illness Pt is a 73 year old male with PMHx significant for HTN, afib on Eliquis, OSA, morbid obesity, L THA and hip infection and history of multiple recent prolonged hospitalizations.  Pt presented to ED with generalized weakness, falls, and shortness of breath- work-up in the emergency room showed A. fib heart rate 108 and a recurrent right-sided pleural effusion    PT Comments    Pt assisted with ambulating short distance again today however limited by dizziness and low BP (see below).  Pt reports not drinking very much fluid due to restriction however appears to have heart healthy diet.  RN notified of BP and pt's fluid intake.    Follow Up Recommendations  SNF     Equipment Recommendations  None recommended by PT    Recommendations for Other Services       Precautions / Restrictions Precautions Precautions: Fall    Mobility  Bed Mobility Overal bed mobility: Needs Assistance Bed Mobility: Supine to Sit     Supine to sit: Mod assist;+2 for safety/equipment;+2 for physical assistance     General bed mobility comments: assist for trunk upright and scooting to EOB  Transfers Overall transfer level: Needs assistance Equipment used: Rolling walker (2 wheeled) Transfers: Sit to/from Stand Sit to Stand: +2 physical assistance;+2 safety/equipment;From elevated surface;Min assist         General transfer comment: Assist to rise, stabilize, control descent, Increased time.   Ambulation/Gait Ambulation/Gait assistance: Min assist;+2 physical assistance Gait Distance (Feet): 14 Feet Assistive device: Rolling walker (2 wheeled) Gait Pattern/deviations: Wide base of support;Trunk flexed;Step-through pattern;Decreased stride length     General Gait Details: assist for stability, slow speed, fatigued very quickly, cues for RW  positioning and posture; one standing rest break with arms propped on RW; pt requested recliner due to dizziness; vitals obtained and in flowsheets (BP 94/60 mmHg) and RN notified   Stairs             Wheelchair Mobility    Modified Rankin (Stroke Patients Only)       Balance                                            Cognition Arousal/Alertness: Awake/alert Behavior During Therapy: WFL for tasks assessed/performed Overall Cognitive Status: Within Functional Limits for tasks assessed                                        Exercises      General Comments        Pertinent Vitals/Pain Pain Assessment: 0-10 Pain Score: 8  Pain Location: back with transitional movements Pain Descriptors / Indicators: Discomfort;Grimacing Pain Intervention(s): Premedicated before session;Monitored during session;Limited activity within patient's tolerance;Repositioned    Home Living                      Prior Function            PT Goals (current goals can now be found in the care plan section) Progress towards PT goals: Progressing toward goals    Frequency    Min 2X/week      PT Plan Current plan  remains appropriate    Co-evaluation              AM-PAC PT "6 Clicks" Mobility   Outcome Measure  Help needed turning from your back to your side while in a flat bed without using bedrails?: A Lot Help needed moving from lying on your back to sitting on the side of a flat bed without using bedrails?: A Lot Help needed moving to and from a bed to a chair (including a wheelchair)?: A Lot Help needed standing up from a chair using your arms (e.g., wheelchair or bedside chair)?: A Lot Help needed to walk in hospital room?: A Lot Help needed climbing 3-5 steps with a railing? : Total 6 Click Score: 11    End of Session Equipment Utilized During Treatment: Gait belt Activity Tolerance: Patient limited by fatigue Patient  left: with call bell/phone within reach;in chair Nurse Communication: Mobility status PT Visit Diagnosis: History of falling (Z91.81);Unsteadiness on feet (R26.81);Muscle weakness (generalized) (M62.81);Difficulty in walking, not elsewhere classified (R26.2)     Time: 7482-7078 PT Time Calculation (min) (ACUTE ONLY): 19 min  Charges:  $Gait Training: 8-22 mins                     Carmelia Bake, PT, DPT Acute Rehabilitation Services Office: [phone] Pager: [phone]  Trena Platt 09/10/2018, 1:17 PM

## 2018-09-10 NOTE — Care Management Important Message (Signed)
Important Message  Patient Details  Name: [name] MRN: [id number] Date of Birth: [birth year]   Medicare Important Message Given:  Yes    Kerin Salen 09/10/2018, 12:38 [place] Message  Patient Details  Name: [name] MRN: [id number] Date of Birth: 1946/07/27   Medicare Important Message Given:  Yes    Kerin Salen 09/10/2018, 12:38 PM

## 2018-09-10 NOTE — Progress Notes (Signed)
Pt. Seen for cpap placement, stated he would mbe able to place on independantly, aware to notify if help needed.

## 2018-09-10 NOTE — Care Management Note (Signed)
Case Management Note  Patient Details  Name: RAMI BUDHU MRN: [id number] Date of Birth: 09-10-1945  Subjective/Objective:                    Action/Plan: Pt plan to discharge home with [facility] with HHRN/PT/OT/SW/NA.   Expected Discharge Date:                  Expected Discharge Plan:  [place]  In-House Referral:  Clinical Social Work  Discharge planning Services  CM Consult  Post Acute Care Choice:    Choice offered to:  Patient  DME Arranged:    DME Agency:  [place] Arranged:  PT, OT, Nurse's Aide [place] Agency:  [place]  Status of Service:  In process, will continue to follow  If discussed at Long Length of Stay Meetings, dates discussed:    Additional CommentsPurcell Mouton, RN 09/10/2018, 4:12 PM

## 2018-09-10 NOTE — Progress Notes (Signed)
[facility] PROGRESS NOTE  NOBLE CICALESE UKG:[id number] DOB: May 26, 1946 DOA: 09/03/2018  PCP: [name], MD  Brief History/Interval Summary: 74 year old male hospitalized recently 12/17-1/2 known A. fib on Eliquis giant cell arteritis OSA BiPAP prior total hip arthroplasty left side,  recently in A. fib RVR with a right-sided effusion that was tapped. He was recommended to go to a skilled facility but was declined for this and had to go home  Presented to the emergency room on 1/4 after just 2 days of being at home with generalized weakness falls and shortness of breath-work-up in the emergency room showed A. fib heart rate 108 and a recurrent right-sided pleural effusion.  Reason for Visit: Recurrent pleural effusion  Consultants: None  Procedures: Thoracentesis 1/4  Antibiotics: Chronically on metronidazole  Subjective/Interval History: Patient very upset that he has not had a chance to speak to the social worker over the last 3 days.  Otherwise he seems to be comfortable.  Denies any shortness of breath this morning.  He was reassured regarding his chest x-ray findings.  ROS: Denies any nausea or vomiting  Objective:  Vital Signs  Vitals:   09/09/18 2304 09/10/18 0500 09/10/18 0506 09/10/18 1054  BP: (!) 95/58  104/72 94/60  Pulse: 84  99 (!) 56  Resp: 18  16   Temp: 100.2 F (37.9 C)  98.2 F (36.8 C)   TempSrc: Oral  Oral   SpO2: 91%  94% 96%  Weight:  132.3 kg    Height:        Intake/Output Summary (Last 24 hours) at 09/10/2018 1115 Last data filed at 09/10/2018 0823 Gross per 24 hour  Intake 680 ml  Output 2775 ml  Net -2095 ml   Filed Weights   09/06/18 0452 09/09/18 0455 09/10/18 0500  Weight: 135.5 kg 133.6 kg 132.3 kg   General appearance: Awake alert.  In no distress Resp: Normal effort at rest.  Diminished air entry at the base on the right.  No crackles or wheezing.  No rhonchi.   Cardio: S1-S2 is normal regular.  No S3-S4.  No rubs  murmurs or bruit GI: Abdomen is soft.  Nontender nondistended.  Bowel sounds are present normal.  No masses organomegaly Extremities: No edema.  Neurologic: Alert and oriented x3.  No focal neurological deficits.     Lab Results:  Data Reviewed: I have personally reviewed following labs and imaging studies  CBC: Recent Labs  Lab 09/03/18 1959 09/04/18 0447 09/05/18 0516 09/06/18 0509  WBC 8.5 6.9 6.2 6.9  NEUTROABS 6.2 4.5 3.8 3.9  HGB 10.2* 9.7* 8.6* 9.4*  HCT 34.8* 32.7* 29.3* 32.2*  MCV 90.4 89.1 89.6 92.3  PLT 315 291 275 762    Basic Metabolic Panel: Recent Labs  Lab 09/05/18 0516 09/06/18 0509 09/07/18 0453 09/08/18 0407 09/10/18 0413  NA 142 142 141 140 140  K 3.8 4.3 4.2 4.2 4.4  CL 105 105 105 104 102  CO2 28 29 29 27 29   GLUCOSE 105* 108* 103* 110* 114*  BUN 19 23 23  24* 23  CREATININE 0.92 0.96 0.96 0.89 1.12  CALCIUM 8.7* 8.8* 8.9 8.8* 8.9    GFR: Estimated Creatinine Clearance: 79.3 mL/min (by C-G formula based on SCr of 1.12 mg/dL).  Liver Function Tests: Recent Labs  Lab 09/03/18 1959 09/05/18 0516 09/06/18 0509 09/07/18 0453 09/08/18 0407  AST 17 13* 12* 12* 13*  ALT 9 8 8 8 8   ALKPHOS 60 46 47 43 44  BILITOT 0.3 0.8 0.9 0.4 0.6  PROT 6.3* 5.3* 5.4* 5.4* 5.3*  ALBUMIN 2.8* 2.7* 2.7* 2.6* 2.6*     Coagulation Profile: Recent Labs  Lab 09/06/18 0509  INR 1.70    Cardiac Enzymes: Recent Labs  Lab 09/03/18 1959  TROPONINI <0.03     Recent Results (from the past 240 hour(s))  Body fluid culture     Status: None   Collection Time: 09/04/18 10:38 AM  Result Value Ref Range Status   Specimen Description   Final    PLEURAL Performed at [facility] [address] [place], [place] [address]    Special Requests   Final    NONE Performed at [facility], [place] [address], [place], [place] [address]    Gram Stain   Final    CYTOSPIN SMEAR WBC PRESENT,BOTH PMN AND MONONUCLEAR NO  ORGANISMS SEEN    Culture   Final    NO GROWTH 3 DAYS Performed at [facility], [place] [address] [place], [place] [address]    Report Status 09/07/2018 FINAL  Final  Acid Fast Smear (AFB)     Status: None   Collection Time: 09/04/18 10:38 AM  Result Value Ref Range Status   AFB Specimen Processing Concentration  Final   Acid Fast Smear Negative  Final    Comment: (NOTE) Performed At: [facility] [place], [place] [id number] Rush Farmer MD DT:[id number]    Source (AFB) PLEURAL  Final    Comment: Performed at [facility], [place] [address] [place], [place] [address]      Radiology Studies: Dg Chest 2 View  Result Date: 09/09/2018 CLINICAL DATA:  BILATERAL pleural effusions, shortness of breath, weakness, history hypertension, CHF, former smoker EXAM: CHEST - 2 VIEW COMPARISON:  09/08/2018 FINDINGS: Enlargement of cardiac silhouette. Atherosclerotic calcification aorta. Mediastinal contours and pulmonary vascularity normal. Bibasilar effusions and atelectasis. Upper lungs clear. No pleural effusion or pneumothorax. Degenerative disc disease changes thoracic spine as well as BILATERAL glenohumeral degenerative changes. IMPRESSION: Enlargement of cardiac silhouette with bibasilar pleural effusions and atelectasis. Electronically Signed   By: Lavonia Dana M.D.   On: 09/09/2018 15:08     Medications:  Scheduled: . apixaban  5 mg Oral BID  . cycloSPORINE  1 drop Both Eyes BID  . diltiazem  180 mg Oral Daily  . escitalopram  10 mg Oral Daily  . feeding supplement (ENSURE ENLIVE)  237 mL Oral BID BM  . furosemide  40 mg Oral BID  . gabapentin  100 mg Oral TID  . lidocaine  1 patch Transdermal Q24H  . Melatonin  6 mg Oral QHS  . metoprolol tartrate  25 mg Oral TID  . metroNIDAZOLE  500 mg Oral BID  . pantoprazole  40 mg Oral Daily  . rOPINIRole  0.25 mg Oral QHS  . senna-docusate  2 tablet Oral BID  . sodium chloride flush  3 mL  Intravenous Q12H  . sodium chloride flush  3 mL Intravenous Q12H  . spironolactone  100 mg Oral BID  . thiamine  100 mg Oral Daily   Continuous: . 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  Repeat fluid analysis on 1/4 does suggest  that this is a transudative process.  Echocardiogram from August shows the patient has normal systolic function.  His imaging studies do suggest hepatic cirrhosis.  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  Patient is on furosemide as well as spironolactone.  Yesterday patient felt more dyspneic.  Chest x-ray was repeated which showed stable findings.  Patient was reassured.  Dose of his furosemide and spironolactone was increased.  Creatinine noted to be 1.12 today.  We will recheck it again tomorrow.  Hepatic cirrhosis Etiology unclear.  Hepatitis panel was negative.  He will need further work-up and follow-up with gastroenterology.  This can be done in the outpatient setting.  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  Lidoderm topically.  Paroxysmal atrial fibrillation Patient is chads 2 vascular score greater than 2.  Patient is on apixaban which is being continued.  Patient is on metoprolol as well as Cardizem.  History of obstructive sleep apnea on CPAP Stable.  History of giant cell arteritis Stable.  Not on steroids at this time.  Morbid obesity He was able to successfully lose weight and BMI went from 54.8-44.  History of bipolar disorder Stable.  Previous left total hip replacement This was done in June (?at [facility]).  Apparently got infected postoperatively.  On  chronic metronidazole for same.  Chronic diplopia Stable.  Normocytic anemia This is likely due to chronic disease.  No evidence for bleeding.  DVT Prophylaxis: On apixaban    Code Status: Full code Family Communication: Discussed with the patient Disposition Plan: Disposition has been an issue.  Skilled nursing facility is recommended due to deconditioning status.  However patient has used up all of his skilled nursing facility days with his insurance.  He will have to pay privately.  Patient is very upset with this.  Physical therapy continues to recommend skilled nursing facility due to his generalized deconditioning.  Do not have a safe discharge plan as yet.    LOS: 4 days   [facility] Pager [phone] 09/10/2018, 11:15 AM  If 7PM-7AM, please contact night-coverage at [email or web address], password [facility]

## 2018-09-11 LAB — BASIC METABOLIC PANEL
Anion gap: 8 (ref 5–15)
BUN: 26 mg/dL — ABNORMAL HIGH (ref 8–23)
CO2: 30 mmol/L (ref 22–32)
Calcium: 9.1 mg/dL (ref 8.9–10.3)
Chloride: 100 mmol/L (ref 98–111)
Creatinine, Ser: 1.05 mg/dL (ref 0.61–1.24)
GFR calc Af Amer: >60 mL/min (ref >60)
GFR calc non Af Amer: >60 mL/min (ref >60)
Glucose, Bld: 96 mg/dL (ref 70–99)
Potassium: 4.4 mmol/L (ref 3.5–5.1)
Sodium: 138 mmol/L (ref 135–145)

## 2018-09-11 MED ORDER — FUROSEMIDE 20 MG PO TABS: 60.0000 mg | ORAL_TABLET | Freq: Every day | ORAL | 1 refills | Status: DC

## 2018-09-11 MED ORDER — OXYCODONE HCL 5 MG PO TABS: 5.0000 mg | ORAL_TABLET | Freq: Four times a day (QID) | ORAL | 0 refills | Status: DC | PRN

## 2018-09-11 MED ORDER — METRONIDAZOLE 500 MG PO TABS: 500.0000 mg | ORAL_TABLET | Freq: Two times a day (BID) | ORAL | Status: DC

## 2018-09-11 MED ORDER — METRONIDAZOLE 500 MG PO TABS: 500.0000 mg | ORAL_TABLET | Freq: Two times a day (BID) | ORAL | 0 refills | Status: DC

## 2018-09-11 MED ORDER — HYDROXYZINE HCL 25 MG PO TABS: 25.0000 mg | ORAL_TABLET | Freq: Three times a day (TID) | ORAL | 0 refills | Status: DC | PRN

## 2018-09-11 MED ORDER — LIDOCAINE 5 % EX PTCH: 1.0000 | MEDICATED_PATCH | CUTANEOUS | 0 refills | Status: DC

## 2018-09-11 MED ORDER — SPIRONOLACTONE 50 MG PO TABS: ORAL_TABLET | ORAL | 0 refills | Status: DC

## 2018-09-11 MED ORDER — MELATONIN 3 MG PO TABS: 6.0000 mg | ORAL_TABLET | Freq: Every day | ORAL | 0 refills | Status: DC

## 2018-09-11 NOTE — Care Management (Addendum)
Contacted [facility] to make aware of dc home today, PTAR arranged. Has hospital bed, wheelchair, trapeze, RW, bedside commode and ramp. Pt requested information on personal care assistance. Provided pt with contact number for agencies that he requested, [place] [place], [facility], [facility] and [facility]. States he will call and get prices. Jonnie Finner RN [facility] Case Mgmt phone [phone]

## 2018-09-11 NOTE — Discharge Summary (Signed)
[facility]  Physician Discharge Summary   Patient ID: [name] MRN: [id number] DOB/AGE: [id number] 73 y.o.  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 Patient to follow-up with his primary care provider for further evaluation of his liver cirrhosis 2. He might need to be referred to gastroenterology. 3. Will need chest x-ray next week.   DISCHARGE CONDITION: fair  Diet recommendation: Heart healthy  Filed Weights   09/09/18 0455 09/10/18 0500 09/11/18 0527  Weight: 133.6 kg 132.3 kg 133.8 kg    INITIAL HISTORY: 73 year old male hospitalized recently 12/17-1/2 known A. fib on Eliquis giant cell arteritis OSA BiPAP prior total hip arthroplasty left side, recently in A. fib RVR with a right-sided effusion that was tapped. He was recommended to go to a skilled facility but was declined for this and had to go home  Presented to the emergency room on 1/4 after just 2 days of being at home with generalized weakness falls and shortness of breath-work-up in the emergency room showed A. fib heart rate 108 and a recurrent right-sided pleural effusion.  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  Repeat fluid analysis on 1/4 does suggest that this is a transudative process.  Echocardiogram from August shows the patient has normal systolic function.  His imaging studies do suggest hepatic cirrhosis.  The fluid could be due to hepatic  hydrothorax.  Patient is on furosemide as well as spironolactone.  Dose was increased slightly.  Patient had serial chest x-rays which showed stable findings.  He saturating normal on room air.  Patient has been very anxious that he is reaccumulating fluid.  He was reassured multiple times.  He will need follow-up chest x-ray in the outpatient setting.  Hopefully the diuretics will prevent pleural effusion from reaccumulating.  Renal function is stable.  Hepatic cirrhosis Etiology unclear.  Hepatitis panel was negative.  He will need further work-up and follow-up with gastroenterology.  This can be done in the outpatient setting.  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  Lidoderm topically.  Paroxysmal atrial fibrillation Patient is chads 2 vascular score greater than 2.  Patient is on apixaban which is being continued.  Patient is on metoprolol as well as Cardizem.  History of obstructive sleep apnea on CPAP Stable.  History of giant cell arteritis Stable.  Not on steroids at this time.  Morbid obesity He was able to successfully lose weight and BMI went from 54.8-44.  History of bipolar disorder Stable.  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  Chronic diplopia Stable.  Normocytic anemia This is likely due to chronic disease.  No evidence for bleeding.  Patient seen by physical therapy who recommended rehab in a skilled nursing facility.  However due to insurance and financial issues he is unable to go to skilled nursing facility as he will have to pay privately for same.  Patient unable to do so.  He does not have any active medical issues  currently.  Home health has been ordered.  Medically stable for discharge.     PERTINENT LABS:  The results of significant diagnostics from this hospitalization (including imaging,  microbiology, ancillary and laboratory) are listed below for reference.    Microbiology: Recent Results (from the past 240 hour(s))  Body fluid culture     Status: None   Collection Time: 09/04/18 10:38 AM  Result Value Ref Range Status   Specimen Description   Final    PLEURAL Performed at [facility] [address], [place], [place] [address]    Special Requests   Final    NONE Performed at [facility], [place] [address], [place], [place] [address]    Gram Stain   Final    CYTOSPIN SMEAR WBC PRESENT,BOTH PMN AND MONONUCLEAR NO ORGANISMS SEEN    Culture   Final    NO GROWTH 3 DAYS Performed at [facility], [place] [address] [place], [place] [address]    Report Status 09/07/2018 FINAL  Final  Acid Fast Smear (AFB)     Status: None   Collection Time: 09/04/18 10:38 AM  Result Value Ref Range Status   AFB Specimen Processing Concentration  Final   Acid Fast Smear Negative  Final    Comment: (NOTE) Performed At: [facility] [place], [place] [id number] Rush Farmer MD YI:[id number]    Source (AFB) PLEURAL  Final    Comment: Performed at [facility], [place] [address], [place], [place] [address]     Labs: Basic Metabolic Panel: Recent Labs  Lab 09/06/18 0509 09/07/18 0453 09/08/18 0407 09/10/18 0413 09/11/18 0535  NA 142 141 140 140 138  K 4.3 4.2 4.2 4.4 4.4  CL 105 105 104 102 100  CO2 29 29 27 29 30   GLUCOSE 108* 103* 110* 114* 96  BUN 23 23 24* 23 26*  CREATININE 0.96 0.96 0.89 1.12 1.05  CALCIUM 8.8* 8.9 8.8* 8.9 9.1   Liver Function Tests: Recent Labs  Lab 09/05/18 0516 09/06/18 0509 09/07/18 0453 09/08/18 0407  AST 13* 12* 12* 13*  ALT 8 8 8 8   ALKPHOS 46 47 43 44  BILITOT 0.8 0.9 0.4 0.6  PROT 5.3* 5.4* 5.4* 5.3*  ALBUMIN 2.7* 2.7* 2.6* 2.6*   CBC: Recent Labs  Lab 09/05/18 0516 09/06/18 0509  WBC 6.2 6.9  NEUTROABS 3.8 3.9  HGB 8.6* 9.4*  HCT 29.3*  32.2*  MCV 89.6 92.3  PLT 275 240     IMAGING STUDIES  Dg Chest 2 View  Result Date: 09/09/2018 CLINICAL DATA:  BILATERAL pleural effusions, shortness of breath, weakness, history hypertension, CHF, former smoker EXAM: CHEST - 2 VIEW COMPARISON:  09/08/2018 FINDINGS: Enlargement of cardiac silhouette. Atherosclerotic calcification aorta. Mediastinal contours and pulmonary vascularity normal. Bibasilar effusions and atelectasis. Upper lungs clear. No pleural effusion or pneumothorax. Degenerative disc disease changes thoracic spine as well as BILATERAL glenohumeral degenerative changes. IMPRESSION: Enlargement of cardiac silhouette with bibasilar pleural effusions and atelectasis. Electronically Signed   By: Lavonia Dana M.D.   On: 09/09/2018 15:08   Dg Chest 2 View  Result Date: 09/08/2018 CLINICAL DATA:  Recurrent pleural effusion on the right EXAM: CHEST - 2 VIEW COMPARISON:  Three days ago FINDINGS: Unchanged small to moderate right pleural effusion that appears to be layering. Unchanged pleural thickening on the left. Chronic cardiomegaly. Stable interstitial coarsening. IMPRESSION: 1. Unchanged small to moderate right pleural effusion. 2. Stable pleural thickening on  the left. Electronically Signed   By: Monte Fantasia M.D.   On: 09/08/2018 08:38   Dg Chest 2 View  Result Date: 09/05/2018 CLINICAL DATA:  One day post thoracentesis. EXAM: CHEST - 2 VIEW COMPARISON:  09/04/2018 FINDINGS: Cardiac silhouette is mildly enlarged. There is interstitial thickening that appears increased compared to the prior. Left pleural effusion is stable. There has been increase in right pleural fluid. No pneumothorax. Skeletal structures are grossly intact. IMPRESSION: 1. Worsened lung aeration compared to the previous day's study. Findings are consistent with interstitial pulmonary edema, likely due to congestive heart failure, with pleural effusions. Right pleural effusion has increased from the previous day's  exam. 2. No pneumothorax. Electronically Signed   By: Lajean Manes M.D.   On: 09/05/2018 16:01   Dg Chest 2 View  Result Date: 09/03/2018 CLINICAL DATA:  Per EMS, pt fell at home this afternoon. Pt complains of left leg knee pain, which EMS states he had before the fall. Pt has hx of chronic back pain. EXAM: CHEST - 2 VIEW COMPARISON:  08/29/2018 and older studies. FINDINGS: Cardiac silhouette is mildly enlarged. No mediastinal or hilar masses. There is bilateral lung base opacity, greater on the right. Opacity is consistent with a combination of small effusions and either atelectasis or infiltrate. Right lung base opacity appears increased from the most recent prior exam. There are prominent bronchovascular markings elsewhere. Linear opacities noted in the left mid lung peripherally consistent with scarring. These findings are stable. No pneumothorax. Skeletal structures are demineralized but grossly intact. IMPRESSION: 1. Bilateral lung base opacities, right greater than left, with the right appearing increased compared to the most recent prior chest radiograph, left stable. Lung base opacities are consistent with a combination of pleural fluid and either atelectasis or infection. No convincing pulmonary edema. Electronically Signed   By: Lajean Manes M.D.   On: 09/03/2018 21:05    Dg Shoulder Right  Result Date: 09/05/2018 CLINICAL DATA:  Chronic right shoulder pain. EXAM: RIGHT SHOULDER - 2+ VIEW COMPARISON:  Plain films right shoulder 05/17/2018. FINDINGS: The patient has bone-on-bone glenohumeral osteoarthritis with small subchondral cysts, sclerosis and an osteophyte off the humeral head. Loose bodies in the subscapularis recess noted. Moderate acromioclavicular osteoarthritis is seen. No acute bony abnormality. IMPRESSION: No acute finding. 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 Electronically Signed   By: Inge Rise M.D.   On: 09/05/2018 16:02   Ct Head Wo  Contrast  Result Date: 09/03/2018 CLINICAL DATA:  Fall. Headache and neck pain. EXAM: CT HEAD WITHOUT CONTRAST CT CERVICAL SPINE WITHOUT CONTRAST TECHNIQUE: Multidetector CT imaging of the head and cervical spine was performed following the standard protocol without intravenous contrast. Multiplanar CT image reconstructions of the cervical spine were also generated. COMPARISON:  07/18/2018 CT head. Cervical spine radiographs from 11/12/2016 FINDINGS: CT HEAD FINDINGS Brain: The brainstem, cerebellum, cerebral peduncles, thalami, basal ganglia, basilar cisterns, and ventricular system appear within normal limits. No intracranial hemorrhage, mass lesion, or acute CVA. Vascular: Unremarkable Skull: Unremarkable Sinuses/Orbits: Prior right maxillary antrostomy. Other: No supplemental non-categorized findings. CT CERVICAL SPINE FINDINGS Alignment: 2 mm degenerative anterolisthesis at C6-7 and C7-T1. Skull base and vertebrae: Chronic defect in the anterior arch of C1 as shown on image 24/5, along with a chronic nonunited fracture at the junction of the lateral mass of C1 and the left C1 lamina. Small ossicle along the left upper margin of the odontoid on image 20/5, with small erosions of the odontoid. Fused right facet joint at  C2-3. Prominent anterior intervertebral spurring with articulating spurs at all levels between C3 and C7. No appreciable acute cervical spine fractures. Soft tissues and spinal canal: Pannus posterior to the odontoid measuring up to 6 mm in thickness. Common carotid atherosclerotic calcification bilaterally. Disc levels: Osseous foraminal narrowing on the right at C3-4 and T2-3; and on the left at C3-4, C4-5, C7-T1, T1-2, and T2-3. Upper chest: Abnormal right posterior pleural thickening likely from the previously seen pleural effusion, although slightly more lobular. Other: No supplemental non-categorized findings. IMPRESSION: 1. No acute intracranial findings or acute cervical spine findings.  2. 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 3. Erosions of C1 anteriorly and of the odontoid, with pannus posterior to the odontoid. 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 4. Multilevel osseous foraminal stenosis due to spurring in the cervical spine. 5. Right pleural effusion at the lung apex. Electronically Signed   By: Van Clines M.D.   On: 09/03/2018 21:36    Ct Cervical Spine Wo Contrast  Result Date: 09/03/2018 CLINICAL DATA:  Fall. Headache and neck pain. EXAM: CT HEAD WITHOUT CONTRAST CT CERVICAL SPINE WITHOUT CONTRAST TECHNIQUE: Multidetector CT imaging of the head and cervical spine was performed following the standard protocol without intravenous contrast. Multiplanar CT image reconstructions of the cervical spine were also generated. COMPARISON:  07/18/2018 CT head. Cervical spine radiographs from 11/12/2016 FINDINGS: CT HEAD FINDINGS Brain: The brainstem, cerebellum, cerebral peduncles, thalami, basal ganglia, basilar cisterns, and ventricular system appear within normal limits. No intracranial hemorrhage, mass lesion, or acute CVA. Vascular: Unremarkable Skull: Unremarkable Sinuses/Orbits: Prior right maxillary antrostomy. Other: No supplemental non-categorized findings. CT CERVICAL SPINE FINDINGS Alignment: 2 mm degenerative anterolisthesis at C6-7 and C7-T1. Skull base and vertebrae: Chronic defect in the anterior arch of C1 as shown on image 24/5, along with a chronic nonunited fracture at the junction of the lateral mass of C1 and the left C1 lamina. Small ossicle along the left upper margin of the odontoid on image 20/5, with small erosions of the odontoid. Fused right facet joint at C2-3. Prominent anterior intervertebral spurring with articulating spurs at all levels between C3 and C7. No appreciable acute cervical spine fractures. Soft tissues and spinal canal: Pannus posterior to the odontoid measuring up to 6  mm in thickness. Common carotid atherosclerotic calcification bilaterally. Disc levels: Osseous foraminal narrowing on the right at C3-4 and T2-3; and on the left at C3-4, C4-5, C7-T1, T1-2, and T2-3. Upper chest: Abnormal right posterior pleural thickening likely from the previously seen pleural effusion, although slightly more lobular. Other: No supplemental non-categorized findings. IMPRESSION: 1. No acute intracranial findings or acute cervical spine findings. 2. 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 3. Erosions of C1 anteriorly and of the odontoid, with pannus posterior to the odontoid. 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 4. Multilevel osseous foraminal stenosis due to spurring in the cervical spine. 5. Right pleural effusion at the lung apex. Electronically Signed   By: Van Clines M.D.   On: 09/03/2018 21:36   US Abdomen Complete  Result Date: 09/06/2018 CLINICAL DATA:  Cirrhosis, status post cholecystectomy EXAM: ABDOMEN ULTRASOUND COMPLETE COMPARISON:  CT abdomen/pelvis dated 01/09/2015 FINDINGS: Gallbladder: Surgically absent. Common bile duct: Diameter: 20 mm, chronic Liver: Coarse pack echotexture with nodular hepatic contour, reflecting cirrhosis. Mild intrahepatic ductal dilatation in the left hepatic lobe, chronic. Portal vein is  patent on color Doppler imaging with normal direction of blood flow towards the liver. IVC: No abnormality visualized. Pancreas: Not visualized due to overlying bowel gas. Spleen: Size and appearance within normal limits. Right Kidney: Length: 11.2 cm. Poorly visualized. No mass or hydronephrosis. Left Kidney: Length: 10.8 cm.  No mass or hydronephrosis. Abdominal aorta: No aneurysm visualized. Other findings: Right pleural effusion. IMPRESSION: Cirrhosis.  No focal hepatic lesion is seen. Status post cholecystectomy. Dilated common duct, measuring 20 mm, chronic. Mild intrahepatic ductal  dilatation, chronic. Right pleural effusion. Electronically Signed   By: Julian Hy M.D.   On: 09/06/2018 18:39   Dg Chest Port 1 View  Result Date: 09/04/2018 CLINICAL DATA:  Status post thoracentesis EXAM: PORTABLE CHEST 1 VIEW COMPARISON:  09/03/2018 FINDINGS: Right pleural effusion has resolved after thoracentesis. No pneumothorax. Left pleural thickening and peripheral basilar heterogeneous opacities are stable. Upper normal heart size. IMPRESSION: No pneumothorax post right thoracentesis. Electronically Signed   By: Marybelle Killings M.D.   On: 09/04/2018 10:30    Dg Hip Unilat W Or Wo Pelvis 2-3 Views Left  Result Date: 09/03/2018 CLINICAL DATA:  Per EMS, pt fell at home this afternoon. Pt complains of left leg knee pain, which EMS states he had before the fall. Pt has hx of chronic back pain. EXAM: DG HIP (WITH OR WITHOUT PELVIS) 2-3V LEFT COMPARISON:  03/15/2018 FINDINGS: No acute fracture. There are bilateral total hip arthroplasties appear well seated. A fracture of the proximal left femoral shaft has some persistent deformity, but appears healed when compared to the prior exam. Skeletal structures are diffusely demineralized. SI joints and symphysis pubis are normally aligned. IMPRESSION: 1. No acute fracture or dislocation. 2. No evidence of loosening of either total hip joint arthroplasty. Electronically Signed   By: Lajean Manes M.D.   On: 09/03/2018 21:07   Dg Femur Min 2 Views Left  Result Date: 09/03/2018 CLINICAL DATA:  Per EMS, pt fell at home this afternoon. Pt complains of left leg knee pain, which EMS states he had before the fall. Pt has hx of chronic back pain. EXAM: LEFT FEMUR 2 VIEWS COMPARISON:  03/15/2018 and 01/11/2018. FINDINGS: The previously seen, from 03/15/2018, fracture of the femoral shaft adjacent to the distal aspect the intramedullary stem of the femoral component the prosthesis, appears healed, but with some persistent deformity/angulation. No acute fracture. The  left hip total arthroplasty appears well seated with no evidence of loosening. There are advanced degenerative changes of the knee joint. Bones are diffusely demineralized.  Soft tissues are unremarkable. IMPRESSION: 1. No acute fracture or dislocation. Electronically Signed   By: Lajean Manes M.D.   On: 09/03/2018 21:09   US Thoracentesis Asp Pleural Space W/img Guide  Result Date: 09/04/2018 INDICATION: Right pleural effusion EXAM: ULTRASOUND GUIDED RIGHT THORACENTESIS MEDICATIONS: None. COMPLICATIONS: None immediate. PROCEDURE: An ultrasound guided thoracentesis was thoroughly discussed with the patient and questions answered. The benefits, risks, alternatives and complications were also discussed. The patient understands and wishes to proceed with the procedure. Written consent was obtained. Ultrasound was performed to localize and mark an adequate pocket of fluid in the right chest. The area was then prepped and draped in the normal sterile fashion. 1% Lidocaine was used for local anesthesia. Under ultrasound guidance a 6 Fr Safe-T-Centesis catheter was introduced. Thoracentesis was performed. The catheter was removed and a dressing applied. FINDINGS: A total of approximately 1.1 L of clear yellow fluid was removed. Samples were sent to the laboratory as requested  by the clinical team. IMPRESSION: Successful ultrasound guided right thoracentesis yielding 1.1 L of pleural fluid. Electronically Signed   By: Marybelle Killings M.D.   On: 09/04/2018 10:30     DISCHARGE EXAMINATION: Vitals:   09/10/18 1513 09/10/18 2012 09/11/18 0525 09/11/18 0527  BP: 112/63 102/70 122/75   Pulse: 95 94 83   Resp: 18     Temp: (!) 97.5 F (36.4 C) 99.5 F (37.5 C) 97.9 F (36.6 C)   TempSrc:  Oral Oral   SpO2: 98% 94% 93%   Weight:    133.8 kg  Height:       General appearance: Awake alert.  In no distress Resp: Normal effort.  Diminished air entry at the base but improved from before.  No wheezing or  rhonchi. Cardio: S1-S2 is normal regular.  No S3-S4.  No rubs murmurs or bruit GI: Abdomen is soft.  Nontender nondistended.  Bowel sounds are present normal.  No masses organomegaly Neurologic: Alert and oriented x3.  No focal neurological deficits.    DISPOSITION: Home with home health  Discharge Instructions    Call MD for:  extreme fatigue   Complete by:  As directed    Call MD for:  persistant dizziness or light-headedness   Complete by:  As directed    Call MD for:  persistant nausea and vomiting   Complete by:  As directed    Call MD for:  severe uncontrolled pain   Complete by:  As directed    Call MD for:  temperature >100.4   Complete by:  As directed    Diet - low sodium heart healthy   Complete by:  As directed    Discharge instructions   Complete by:  As directed    Home health has been ordered.  Please follow-up with your primary care provider for further medical issues.  You will need periodic chest x-ray starting next week to monitor the pleural effusion.  Take your medications as prescribed.  Seek attention if your shortness of breath gets significantly worse.  You should expect some shortness of breath with exertion.  You were cared for by a hospitalist during your hospital stay. If you have any questions about your discharge medications or the care you received while you were in the hospital after you are discharged, you can call the unit and asked to speak with the hospitalist on call if the hospitalist that took care of you is not available. Once you are discharged, your primary care physician will handle any further medical issues. Please note that NO REFILLS for any discharge medications will be authorized once you are discharged, as it is imperative that you return to your primary care physician (or establish a relationship with a primary care physician if you do not have one) for your aftercare needs so that they can reassess your need for medications and monitor  your lab values. If you do not have a primary care physician, you can call [phone] for a physician referral.   Increase activity slowly   Complete by:  As directed         Allergies as of 09/11/2018      Reactions   Bee Venom Swelling   Nickel Rash      Medication List    TAKE these medications   acetaminophen 325 MG tablet Commonly known as:  TYLENOL Take 2 tablets (650 mg total) by mouth every 6 (six) hours as needed for mild pain, fever or headache.  albuterol 108 (90 Base) MCG/ACT inhaler Commonly known as:  PROVENTIL HFA;VENTOLIN HFA Inhale 1-2 puffs into the lungs every 6 (six) hours as needed for wheezing or shortness of breath.   apixaban 5 MG Tabs tablet Commonly known as:  ELIQUIS Take 1 tablet (5 mg total) by mouth 2 (two) times daily.   calcium carbonate 500 MG chewable tablet Commonly known as:  TUMS - dosed in mg elemental calcium Chew 1,000 mg by mouth 3 (three) times daily as needed for indigestion.   carboxymethylcellulose 0.5 % Soln Commonly known as:  REFRESH TEARS Place 1 drop into both eyes daily as needed (dry eye/irritation).   CENTRUM ADULTS PO Take 1 tablet by mouth every evening.   cycloSPORINE 0.05 % ophthalmic emulsion Commonly known as:  RESTASIS Place 1 drop into both eyes 2 (two) times daily.   diltiazem 180 MG 24 hr capsule Commonly known as:  CARDIZEM CD Take 1 capsule (180 mg total) by mouth daily.   escitalopram 10 MG tablet Commonly known as:  LEXAPRO Take 1 tablet (10 mg total) by mouth daily.   ferrous sulfate 325 (65 FE) MG tablet Take 1 tablet (325 mg total) by mouth 3 (three) times daily with meals.   furosemide 20 MG tablet Commonly known as:  LASIX Take 3 tablets (60 mg total) by mouth daily. What changed:  how much to take   gabapentin 100 MG capsule Commonly known as:  NEURONTIN Take 1 capsule (100 mg total) by mouth 3 (three) times daily.   hydrOXYzine 25 MG tablet Commonly known as:  ATARAX/VISTARIL Take  1 tablet (25 mg total) by mouth 3 (three) times daily as needed for itching or anxiety.   lidocaine 5 % Commonly known as:  LIDODERM Place 1 patch onto the skin daily. Remove & Discard patch within 12 hours or as directed by MD   Melatonin 3 MG Tabs Take 2 tablets (6 mg total) by mouth at bedtime.   metoprolol tartrate 25 MG tablet Commonly known as:  LOPRESSOR Take 1 tablet (25 mg total) by mouth 2 (two) times daily.   metroNIDAZOLE 500 MG tablet Commonly known as:  FLAGYL Take 1 tablet (500 mg total) by mouth 2 (two) times daily. What changed:  additional instructions   oxyCODONE 5 MG immediate release tablet Commonly known as:  Oxy IR/ROXICODONE Take 1 tablet (5 mg total) by mouth every 6 (six) hours as needed for moderate pain, severe pain or breakthrough pain.   pantoprazole 40 MG tablet Commonly known as:  PROTONIX Take 1 tablet (40 mg total) by mouth daily.   polyethylene glycol packet Commonly known as:  MIRALAX / GLYCOLAX Take 17 g by mouth daily as needed for mild constipation.   potassium chloride SA 20 MEQ tablet Commonly known as:  K-DUR,KLOR-CON Take 1 tablet (20 mEq total) by mouth daily.   rOPINIRole 0.25 MG tablet Commonly known as:  REQUIP Take 1 tablet (0.25 mg total) by mouth at bedtime.   senna-docusate 8.6-50 MG tablet Commonly known as:  Senokot-S Take 2 tablets by mouth 2 (two) times daily.   spironolactone 50 MG tablet Commonly known as:  ALDACTONE Take 100mg  every morning and 50mg  every evening   thiamine 100 MG tablet Take 100 mg by mouth daily.   Vitamin D3 50 MCG (2000 UT) Tabs Take 2,000 Units by mouth every evening.        Follow-up Information    [name], MD. Schedule an appointment as soon as possible for a  visit in 1 week(s).   Specialty:  Family Medicine Contact information: [place] [address] [phone]           TOTAL DISCHARGE TIME: 35 minutes  Bonnielee Haff  [facility] Pager [phone]  09/11/2018, 11:26 AM

## 2018-09-20 NOTE — Telephone Encounter (Signed)
09/20/2018 2138  Pt still has not be scheduled with our office for follow up. Please contact the patient and see if he is ready to schedule his hospital follow up.   Intital consult date: 08/27/18 Nelda Marseille when [place].  Aaron Edelman

## 2018-09-21 NOTE — Telephone Encounter (Signed)
Spoke with the pt and scheduled HFU with [name] for 10/01/2018 at 3 pm Pt notified of our office location

## 2018-09-30 NOTE — Progress Notes (Signed)
@Patient  ID: [name], male    DOB: [birth year], 73 y.o.   MRN: [id number]  Chief Complaint  Patient presents with  . 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 Valeta Harms   10/01/2018  - Visit   73 year old male former smoker presenting to our office today for a hospital follow-up.  Patient was initially seen by team and consulted in December/2018 when he was having repeated right pleural effusions requiring repeated thoracentesis.  So far cytology of those results have been negative for malignancy.  See test results listed below.  Causes of chronic right pleural effusions are linked to either hepatic hydrothorax, chronic hypo-albuminemia, or congestive heart failure.  Patient presents today stating that he is had worsening fatigue, decreased appetite, and that his left hip wound is starting to become more red and tender.  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  Patient reports that he has not followed up with infectious disease at [facility] and he has not contacted them regarding his concerns regarding left hip wound.  Patient has concerns that he feels like he needs to go to the hospital for further evaluation.  Patient is  afebrile today.   Chest x-ray was performed prior to patient being seen in office today.  Does appear that he has has small right pleural effusion but this is improved from last chest x-ray.  Still waiting on official radiologist read.  Unfortunately patient also has little to no caregiver support at home.  He reports that somebody does check on him 1 time a day but reports that she does not manage his help or help manage his medications.  Patient reports that wound care does come by to check on him but he feels that he needs additional support at home such as being back in a rehab facility.   Tests:   08/17/18 - Rt  thoracentesis - 86mL Pleural effusion analysis with protein <3, LDH 473, WBC of 2815 with 40% PMNs and 26% mono, cytology is negative for malignancy  08/26/18 - Rt  Throracentesis - 1.4L -   Fluid analysis consistent with exudate again by LDH criteria but this time the fluid is bloody with mostly lymphs which is more consistent with a malignancy, Cytology negative. Right pleural fluid culture 12/26: Negative.   09/04/2018- Rt Thora - 1.1L Getting ultrasound-guided thoracentesis I have added cell count and pathology as it is unusual to reaccumulate fluid this quickly-he has been a prior smoker but that was a long time ago and he had a CT scan of the chest on most recent admission which did not show any evidence of mass or malignancy  By light's criteria his pleural fluid seems to be transudative, Fluid protein 3, serum protein 6.3 Fluid LDH 43, serum LDH 139 >>>potential could be  that patient has hepatic hydrothorax or chronic hypoalbuminemia [more likely would cause bilateral effusions   08/24/18 - Auto-immune panel with CRP of 4.1 and negative ANA, P-ANCA, MPO, C-ANCA   04/17/2018-echocardiogram-LV ejection fraction 50 to 55%, diffuse hypokinesis, atrial fibrillation with RVR during acquisition  FENO:  No results found for: NITRICOXIDE  PFT: PFT Results Latest Ref Rng &  Units 08/16/2014  FVC-Pre L 3.13  FVC-Predicted Pre % 72  FVC-Post L 3.20  FVC-Predicted Post % 74  Pre FEV1/FVC % % 70  Post FEV1/FCV % % 72  FEV1-Pre L 2.18  FEV1-Predicted Pre % 68  FEV1-Post L 2.30  DLCO UNC% % 79  DLCO COR %Predicted % 116  TLC L 6.01  TLC % Predicted % 88  RV % Predicted % 92    Imaging: Dg Chest 1 View  Result Date: 10/01/2018 CLINICAL DATA:  Follow-up pneumothorax. EXAM: CHEST  1 VIEW COMPARISON:  09/09/2018 FINDINGS: Mild bilateral interstitial thickening. Small bilateral pleural effusions. Left lateral pleural thickening with a subtle lucency which may reflect a small loculated pneumothorax versus artifact. Right infrahilar hazy airspace disease likely reflecting atelectasis. Stable cardiomediastinal silhouette. No aggressive osseous lesion. Mild osteoarthritis of bilateral glenohumeral joints. IMPRESSION: Left lateral pleural thickening with a subtle lucency which may reflect a small loculated pneumothorax versus artifact. Recommend follow-up chest x-ray for re-evaluation. Bilateral interstitial thickening and small bilateral pleural effusions concerning for mild pulmonary edema. Electronically Signed   By: Kathreen Devoid   On: 10/01/2018 15:38   Dg Chest 2 View  Result Date: 09/09/2018 CLINICAL DATA:  BILATERAL pleural effusions, shortness of breath, weakness, history hypertension, CHF, former smoker EXAM: CHEST - 2 VIEW COMPARISON:  09/08/2018 FINDINGS: Enlargement of cardiac silhouette. Atherosclerotic calcification aorta. Mediastinal contours and pulmonary vascularity normal. Bibasilar effusions and atelectasis. Upper lungs clear. No pleural effusion or pneumothorax. Degenerative disc disease changes thoracic spine as well as BILATERAL glenohumeral degenerative changes. IMPRESSION: Enlargement of cardiac silhouette with bibasilar pleural effusions and atelectasis. Electronically Signed   By: Lavonia Dana M.D.   On: 09/09/2018 15:08   Dg Chest 2 View  Result  Date: 09/08/2018 CLINICAL DATA:  Recurrent pleural effusion on the right EXAM: CHEST - 2 VIEW COMPARISON:  Three days ago FINDINGS: Unchanged small to moderate right pleural effusion that appears to be layering. Unchanged pleural thickening on the left. Chronic cardiomegaly. Stable interstitial coarsening. IMPRESSION: 1. Unchanged small to moderate right pleural effusion. 2. Stable pleural thickening on the left. Electronically Signed   By: Monte Fantasia M.D.   On: 09/08/2018 08:38   Dg Chest 2 View  Result Date: 09/05/2018 CLINICAL DATA:  One day post thoracentesis. EXAM: CHEST - 2 VIEW COMPARISON:  09/04/2018 FINDINGS: Cardiac silhouette is mildly enlarged. There is interstitial thickening that appears increased compared to the prior. Left pleural effusion is stable. There has been increase in right pleural fluid. No pneumothorax. Skeletal structures are grossly intact. IMPRESSION: 1. Worsened lung aeration compared to the previous day's study. Findings are consistent with interstitial pulmonary edema, likely due to congestive heart failure, with pleural effusions. Right pleural effusion has increased from the previous day's exam. 2. No pneumothorax. Electronically Signed   By: Lajean Manes M.D.   On: 09/05/2018 16:01   Dg Chest 2 View  Result Date: 09/03/2018 CLINICAL DATA:  Per EMS, pt fell at home this afternoon. Pt complains of left leg knee pain, which EMS states he had before the fall. Pt has hx of chronic back pain. EXAM: CHEST -  2 VIEW COMPARISON:  08/29/2018 and older studies. FINDINGS: Cardiac silhouette is mildly enlarged. No mediastinal or hilar masses. There is bilateral lung base opacity, greater on the right. Opacity is consistent with a combination of small effusions and either atelectasis or infiltrate. Right lung base opacity appears increased from the most recent prior exam. There are prominent bronchovascular markings elsewhere. Linear opacities noted in the left mid lung peripherally  consistent with scarring. These findings are stable. No pneumothorax. Skeletal structures are demineralized but grossly intact. IMPRESSION: 1. Bilateral lung base opacities, right greater than left, with the right appearing increased compared to the most recent prior chest radiograph, left stable. Lung base opacities are consistent with a combination of pleural fluid and either atelectasis or infection. No convincing pulmonary edema. Electronically Signed   By: Lajean Manes M.D.   On: 09/03/2018 21:05   Dg Shoulder Right  Result Date: 09/05/2018 CLINICAL DATA:  Chronic right shoulder pain. EXAM: RIGHT SHOULDER - 2+ VIEW COMPARISON:  Plain films right shoulder 05/17/2018. FINDINGS: The patient has bone-on-bone glenohumeral osteoarthritis with small subchondral cysts, sclerosis and an osteophyte off the humeral head. Loose bodies in the subscapularis recess noted. Moderate acromioclavicular osteoarthritis is seen. No acute bony abnormality. IMPRESSION: No acute finding. 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 Electronically Signed   By: Inge Rise M.D.   On: 09/05/2018 16:02   Ct Head Wo Contrast  Result Date: 09/03/2018 CLINICAL DATA:  Fall. Headache and neck pain. EXAM: CT HEAD WITHOUT CONTRAST CT CERVICAL SPINE WITHOUT CONTRAST TECHNIQUE: Multidetector CT imaging of the head and cervical spine was performed following the standard protocol without intravenous contrast. Multiplanar CT image reconstructions of the cervical spine were also generated. COMPARISON:  07/18/2018 CT head. Cervical spine radiographs from 11/12/2016 FINDINGS: CT HEAD FINDINGS Brain: The brainstem, cerebellum, cerebral peduncles, thalami, basal ganglia, basilar cisterns, and ventricular system appear within normal limits. No intracranial hemorrhage, mass lesion, or acute CVA. Vascular: Unremarkable Skull: Unremarkable Sinuses/Orbits: Prior right maxillary antrostomy. Other: No supplemental  non-categorized findings. CT CERVICAL SPINE FINDINGS Alignment: 2 mm degenerative anterolisthesis at C6-7 and C7-T1. Skull base and vertebrae: Chronic defect in the anterior arch of C1 as shown on image 24/5, along with a chronic nonunited fracture at the junction of the lateral mass of C1 and the left C1 lamina. Small ossicle along the left upper margin of the odontoid on image 20/5, with small erosions of the odontoid. Fused right facet joint at C2-3. Prominent anterior intervertebral spurring with articulating spurs at all levels between C3 and C7. No appreciable acute cervical spine fractures. Soft tissues and spinal canal: Pannus posterior to the odontoid measuring up to 6 mm in thickness. Common carotid atherosclerotic calcification bilaterally. Disc levels: Osseous foraminal narrowing on the right at C3-4 and T2-3; and on the left at C3-4, C4-5, C7-T1, T1-2, and T2-3. Upper chest: Abnormal right posterior pleural thickening likely from the previously seen pleural effusion, although slightly more lobular. Other: No supplemental non-categorized findings. IMPRESSION: 1. No acute intracranial findings or acute cervical spine findings. 2. 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 3. Erosions of C1 anteriorly and of the odontoid, with pannus posterior to the odontoid. 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 4. Multilevel osseous foraminal stenosis due to spurring in the cervical spine. 5. Right pleural effusion at the lung apex. Electronically Signed   By: Van Clines M.D.   On: 09/03/2018 21:36   Ct Cervical  Spine Wo Contrast  Result Date: 09/03/2018 CLINICAL DATA:  Fall. Headache and neck pain. EXAM: CT HEAD WITHOUT CONTRAST CT CERVICAL SPINE WITHOUT CONTRAST TECHNIQUE: Multidetector CT imaging of the head and cervical spine was performed following the standard protocol without intravenous contrast. Multiplanar CT image  reconstructions of the cervical spine were also generated. COMPARISON:  07/18/2018 CT head. Cervical spine radiographs from 11/12/2016 FINDINGS: CT HEAD FINDINGS Brain: The brainstem, cerebellum, cerebral peduncles, thalami, basal ganglia, basilar cisterns, and ventricular system appear within normal limits. No intracranial hemorrhage, mass lesion, or acute CVA. Vascular: Unremarkable Skull: Unremarkable Sinuses/Orbits: Prior right maxillary antrostomy. Other: No supplemental non-categorized findings. CT CERVICAL SPINE FINDINGS Alignment: 2 mm degenerative anterolisthesis at C6-7 and C7-T1. Skull base and vertebrae: Chronic defect in the anterior arch of C1 as shown on image 24/5, along with a chronic nonunited fracture at the junction of the lateral mass of C1 and the left C1 lamina. Small ossicle along the left upper margin of the odontoid on image 20/5, with small erosions of the odontoid. Fused right facet joint at C2-3. Prominent anterior intervertebral spurring with articulating spurs at all levels between C3 and C7. No appreciable acute cervical spine fractures. Soft tissues and spinal canal: Pannus posterior to the odontoid measuring up to 6 mm in thickness. Common carotid atherosclerotic calcification bilaterally. Disc levels: Osseous foraminal narrowing on the right at C3-4 and T2-3; and on the left at C3-4, C4-5, C7-T1, T1-2, and T2-3. Upper chest: Abnormal right posterior pleural thickening likely from the previously seen pleural effusion, although slightly more lobular. Other: No supplemental non-categorized findings. IMPRESSION: 1. No acute intracranial findings or acute cervical spine findings. 2. 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 3. Erosions of C1 anteriorly and of the odontoid, with pannus posterior to the odontoid. 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 4. Multilevel osseous foraminal stenosis due to spurring in the  cervical spine. 5. Right pleural effusion at the lung apex. Electronically Signed   By: Van Clines M.D.   On: 09/03/2018 21:36   Ct Hip Left W Contrast  Result Date: 10/01/2018 CLINICAL DATA:  Left hip pain EXAM: CT OF THE LOWER LEFT EXTREMITY WITH CONTRAST TECHNIQUE: Multidetector CT imaging of the lower left extremity was performed according to the standard protocol following intravenous contrast administration. COMPARISON:  09/03/2018 CONTRAST:  150mL OMNIPAQUE IOHEXOL 300 MG/ML  SOLN FINDINGS: Bones/Joint/Cartilage An uncemented left total hip arthroplasty is identified with the tip traversing a left femoral diaphyseal fracture that is incompletely healed. Subtle residual fracture lucencies are suggested on the coronal reformats, series 6/46 through 48 for instance. There is callus formation developing about the fracture, series 3/143. Slight residual varus configuration about the fracture is seen of the femur. No new/acute appearing fracture or bone destruction is seen. With reference to the hip joint, no significant joint effusion or abnormal lucencies to suggest loosening. The prosthetic femoral head is seated within the acetabular component without evidence of asymmetric lining wear. No soft tissue mass or mineralization. No hematoma or abnormal fluid collections. Osteoarthritis of the included SI joints and pubic symphysis. No diastasis. Ligaments Suboptimally assessed by CT. Muscles and Tendons Negative for muscle edema, hematoma or definite tear. Soft tissues No soft tissue mass, mineralization or fluid collections. Postop scarring along the lateral aspect of the hip. IMPRESSION: 1. An uncemented left total hip arthroplasty is identified with the tip traversing a left femoral diaphyseal fracture that is incompletely  healed. Subtle residual fracture lucencies are suggested on the coronal reformats, series 6/46 through 48. Slight residual varus configuration of the femur about the fracture is  noted. 2. No new/acute appearing fracture or bone destruction is seen. 3. No significant joint effusion to suggest septic arthritis though this is of clinical concern, percutaneous sampling of the joint fluid could be attempted. No definite evidence of hardware loosening however. Electronically Signed   By: Ashley Royalty M.D.   On: 10/01/2018 20:08   US Abdomen Complete  Result Date: 09/06/2018 CLINICAL DATA:  Cirrhosis, status post cholecystectomy EXAM: ABDOMEN ULTRASOUND COMPLETE COMPARISON:  CT abdomen/pelvis dated 01/09/2015 FINDINGS: Gallbladder: Surgically absent. Common bile duct: Diameter: 20 mm, chronic Liver: Coarse pack echotexture with nodular hepatic contour, reflecting cirrhosis. Mild intrahepatic ductal dilatation in the left hepatic lobe, chronic. Portal vein is patent on color Doppler imaging with normal direction of blood flow towards the liver. IVC: No abnormality visualized. Pancreas: Not visualized due to overlying bowel gas. Spleen: Size and appearance within normal limits. Right Kidney: Length: 11.2 cm. Poorly visualized. No mass or hydronephrosis. Left Kidney: Length: 10.8 cm.  No mass or hydronephrosis. Abdominal aorta: No aneurysm visualized. Other findings: Right pleural effusion. IMPRESSION: Cirrhosis.  No focal hepatic lesion is seen. Status post cholecystectomy. Dilated common duct, measuring 20 mm, chronic. Mild intrahepatic ductal dilatation, chronic. Right pleural effusion. Electronically Signed   By: Julian Hy M.D.   On: 09/06/2018 18:39   Dg Chest Port 1 View  Result Date: 10/01/2018 CLINICAL DATA:  LEFT hip replacement 6 months ago, sepsis, question LEFT leg/hip infection EXAM: PORTABLE CHEST 1 VIEW COMPARISON:  Portable exam 1756 hours compared to 10/01/2018 FINDINGS: RIGHT costophrenic angle excluded. Enlargement of cardiac silhouette. Mediastinal contours and pulmonary vascularity normal. Atherosclerotic calcification aorta. Small LEFT pleural effusion and basilar  atelectasis at lower LEFT chest question partially loculated. No definite infiltrate or pneumothorax. Bones demineralized. IMPRESSION: LEFT pleural effusion and basilar atelectasis, question partially locule at the lower lateral LEFT chest. Electronically Signed   By: Lavonia Dana M.D.   On: 10/01/2018 18:05   Dg Chest Port 1 View  Result Date: 09/04/2018 CLINICAL DATA:  Status post thoracentesis EXAM: PORTABLE CHEST 1 VIEW COMPARISON:  09/03/2018 FINDINGS: Right pleural effusion has resolved after thoracentesis. No pneumothorax. Left pleural thickening and peripheral basilar heterogeneous opacities are stable. Upper normal heart size. IMPRESSION: No pneumothorax post right thoracentesis. Electronically Signed   By: Marybelle Killings M.D.   On: 09/04/2018 10:30   Dg Hip Unilat W Or Wo Pelvis 2-3 Views Left  Result Date: 09/03/2018 CLINICAL DATA:  Per EMS, pt fell at home this afternoon. Pt complains of left leg knee pain, which EMS states he had before the fall. Pt has hx of chronic back pain. EXAM: DG HIP (WITH OR WITHOUT PELVIS) 2-3V LEFT COMPARISON:  03/15/2018 FINDINGS: No acute fracture. There are bilateral total hip arthroplasties appear well seated. A fracture of the proximal left femoral shaft has some persistent deformity, but appears healed when compared to the prior exam. Skeletal structures are diffusely demineralized. SI joints and symphysis pubis are normally aligned. IMPRESSION: 1. No acute fracture or dislocation. 2. No evidence of loosening of either total hip joint arthroplasty. Electronically Signed   By: Lajean Manes M.D.   On: 09/03/2018 21:07   Dg Femur Min 2 Views Left  Result Date: 09/03/2018 CLINICAL DATA:  Per EMS, pt fell at home this afternoon. Pt complains of left leg knee pain, which EMS states he  had before the fall. Pt has hx of chronic back pain. EXAM: LEFT FEMUR 2 VIEWS COMPARISON:  03/15/2018 and 01/11/2018. FINDINGS: The previously seen, from 03/15/2018, fracture of the  femoral shaft adjacent to the distal aspect the intramedullary stem of the femoral component the prosthesis, appears healed, but with some persistent deformity/angulation. No acute fracture. The left hip total arthroplasty appears well seated with no evidence of loosening. There are advanced degenerative changes of the knee joint. Bones are diffusely demineralized.  Soft tissues are unremarkable. IMPRESSION: 1. No acute fracture or dislocation. Electronically Signed   By: Lajean Manes M.D.   On: 09/03/2018 21:09   US Thoracentesis Asp Pleural Space W/img Guide  Result Date: 09/04/2018 INDICATION: Right pleural effusion EXAM: ULTRASOUND GUIDED RIGHT THORACENTESIS MEDICATIONS: None. COMPLICATIONS: None immediate. PROCEDURE: An ultrasound guided thoracentesis was thoroughly discussed with the patient and questions answered. The benefits, risks, alternatives and complications were also discussed. The patient understands and wishes to proceed with the procedure. Written consent was obtained. Ultrasound was performed to localize and mark an adequate pocket of fluid in the right chest. The area was then prepped and draped in the normal sterile fashion. 1% Lidocaine was used for local anesthesia. Under ultrasound guidance a 6 Fr Safe-T-Centesis catheter was introduced. Thoracentesis was performed. The catheter was removed and a dressing applied. FINDINGS: A total of approximately 1.1 L of clear yellow fluid was removed. Samples were sent to the laboratory as requested by the clinical team. IMPRESSION: Successful ultrasound guided right thoracentesis yielding 1.1 L of pleural fluid. Electronically Signed   By: Marybelle Killings M.D.   On: 09/04/2018 10:30      Specialty Problems      Pulmonary Problems   SOB (shortness of breath)   OSA (obstructive sleep apnea)   OSA on CPAP   Pleural effusion on left   Pleural effusion, bilateral   Recurrent 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    08/17/18 - Rt  thoracentesis - 878mL  Pleural effusion analysis with protein <3, LDH 473, WBC of 2815 with 40% PMNs and 26% mono, cytology is negative for malignancy  08/26/18 - Rt  Throracentesis - 1.4L -   Fluid analysis consistent with exudate again by LDH criteria but this time the fluid is bloody with mostly lymphs which is more consistent with a malignancy, Cytology negative. Right pleural fluid culture 12/26: Negative.   09/04/2018- Rt Thora - 1.1L Getting ultrasound-guided thoracentesis I have added cell count and pathology as it is unusual to reaccumulate fluid this quickly-he has been a prior smoker but that was a long time ago and he had a CT scan of the chest on most recent admission which did not show any evidence of mass or malignancy  By light's criteria his pleural fluid seems to be transudative, Fluid protein 3, serum protein 6.3 Fluid LDH 43, serum LDH 139 >>>potential could be that patient has hepatic hydrothorax or chronic hypoalbuminemia [more likely would cause bilateral effusions   08/24/18 - Auto-immune panel with CRP of 4.1 and negative ANA, P-ANCA, MPO, C-ANCA   04/17/2018-echocardiogram-LV ejection fraction 50 to 55%, diffuse hypokinesis, atrial fibrillation with RVR during acquisition         Allergies  Allergen Reactions  . Bee Venom Swelling  . Nickel Rash     There is no immunization history on file for this patient.  Past Medical History:  Diagnosis Date  . Anginal pain ([place]) 2006   evaluated by cardio  . Arthritis    knees,feet,shoulders,elbows.hands  .  Constipation   . Dyspnea    with exertion   . Dysrhythmia    Atrial Flutter- 2006- corredted itself  . Family history of adverse reaction to anesthesia    mother- with novocaine went into shock  . GERD (gastroesophageal reflux disease)   . Headache   . Hemorrhoids   . History of blood transfusion   . Hypertension   . Insomnia   . Sleep apnea    cpap  . 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 Packs/day: 2.00  . Years: 10.00  . Pack years: 20.00  . Types: Cigarettes  . Start date: 05/16/1966  . Last attempt to quit: 03/11/1984  . Years since quitting: 34.5  Smokeless Tobacco Never Used   Counseling given: Yes   No facility-administered encounter medications on file as of 10/01/2018.    Outpatient Encounter Medications as of 10/01/2018  Medication Sig  . albuterol (PROVENTIL HFA;VENTOLIN HFA) 108 (90 Base) MCG/ACT inhaler Inhale 1-2 puffs into the lungs every 6 (six) hours as needed for wheezing or shortness of breath.  Marland Kitchen apixaban (ELIQUIS) 5 MG TABS tablet Take 1 tablet (5 mg total) by mouth 2 (two) times daily.  . calcium carbonate (TUMS - DOSED IN MG ELEMENTAL CALCIUM) 500 MG chewable tablet Chew 1,000 mg by mouth 3 (three) times daily as needed for indigestion.  . carboxymethylcellulose (REFRESH TEARS) 0.5 % SOLN Place 1 drop into both eyes daily as needed (dry eye/irritation).  . Cholecalciferol (VITAMIN D3) 2000 units TABS Take 2,000 Units by mouth every evening.   . cycloSPORINE (RESTASIS) 0.05 % ophthalmic emulsion Place 1 drop into both eyes 2 (two) times daily.  Marland Kitchen diltiazem (CARDIZEM CD) 180 MG 24 hr capsule Take 1 capsule (180 mg total) by mouth daily.  Marland Kitchen escitalopram (LEXAPRO) 10 MG tablet Take 1 tablet (10 mg total) by mouth daily.  . ferrous sulfate 325 (65 FE) MG tablet Take 1 tablet (325 mg total) by mouth 3 (three) times daily with meals.  . furosemide (LASIX) 20 MG tablet Take 3 tablets (60 mg total) by mouth daily.  Marland Kitchen gabapentin (NEURONTIN) 100 MG capsule Take 1 capsule (100 mg total) by mouth 3 (three) times daily.  . hydrOXYzine (ATARAX/VISTARIL) 25 MG tablet Take 1 tablet (25 mg total) by mouth 3 (three) times daily as needed for itching or anxiety.  . lidocaine (LIDODERM) 5 % Place 1 patch onto the skin daily. Remove & Discard patch within 12 hours or as directed by MD  . Melatonin 3  MG TABS Take 2 tablets (6 mg total) by mouth at bedtime.  . metoprolol tartrate (LOPRESSOR) 25 MG tablet Take 1 tablet (25 mg total) by mouth 2 (two) times daily.  . metroNIDAZOLE (FLAGYL) 500 MG tablet Take 1 tablet (500 mg total) by mouth 2 (two) times daily.  . Multiple Vitamins-Minerals (CENTRUM ADULTS PO) Take 1 tablet by mouth every evening.   Marland Kitchen oxyCODONE (OXY IR/ROXICODONE) 5 MG immediate release tablet Take 1 tablet (5 mg total) by mouth every 6 (six) hours as needed for moderate pain, severe pain or breakthrough pain.  . pantoprazole (PROTONIX) 40 MG tablet Take 1 tablet (40 mg total) by mouth daily.  . polyethylene glycol (MIRALAX / GLYCOLAX) packet Take 17 g by mouth daily as needed for mild constipation.  . potassium chloride SA (K-DUR,KLOR-CON) 20 MEQ tablet Take 1 tablet (20 mEq total) by mouth daily.  Marland Kitchen  rOPINIRole (REQUIP) 0.25 MG tablet Take 1 tablet (0.25 mg total) by mouth at bedtime.  Marland Kitchen spironolactone (ALDACTONE) 50 MG tablet Take 100mg  every morning and 50mg  every evening  . thiamine 100 MG tablet Take 100 mg by mouth daily.  Marland Kitchen acetaminophen (TYLENOL) 325 MG tablet Take 2 tablets (650 mg total) by mouth every 6 (six) hours as needed for mild pain, fever or headache. (Patient not taking: Reported on 10/01/2018)  . senna-docusate (SENOKOT-S) 8.6-50 MG tablet Take 2 tablets by mouth 2 (two) times daily. (Patient not taking: Reported on 10/01/2018)     Review of Systems  Review of Systems  Constitutional: Positive for activity change (Unable to do his many activities at home, reporting more falls), appetite change (Poor appetite), chills, fatigue and fever (Reports that he has been feeling hot at home, no recorded temperatures).  HENT: Negative for sinus pressure and sinus pain.   Respiratory: Positive for shortness of breath. Negative for cough and wheezing.   Cardiovascular: Negative for chest pain and palpitations.  Gastrointestinal: Negative for diarrhea, nausea and vomiting.   Musculoskeletal:       High risk for falls Severe chronic pain  Skin:       Patient reporting left hip wound has increased redness     Physical Exam  BP 138/84 (BP Location: Right Arm, Cuff Size: Normal)   Pulse 86   Temp 98.4 F (36.9 C)   SpO2 96%   Wt Readings from Last 5 Encounters:  10/01/18 298 lb (135.2 kg)  09/11/18 295 lb (133.8 kg)  09/02/18 (!) 314 lb 9.6 oz (142.7 kg)  04/24/18 (!) 360 lb 10.8 oz (163.6 kg)  03/17/18 (!) 343 lb (155.6 kg)     Physical Exam  Constitutional: He is oriented to person, place, and time. He appears unhealthy. He has a sickly appearance. He appears distressed.  Frail elderly male  HENT:  Head: Normocephalic and atraumatic.  Right Ear: Hearing, tympanic membrane, external ear and ear canal normal.  Left Ear: Hearing, tympanic membrane, external ear and ear canal normal.  Mouth/Throat: Uvula is midline and oropharynx is clear and moist. No oropharyngeal exudate.  Eyes: Pupils are equal, round, and reactive to light.  Neck: Normal range of motion. Neck supple. No JVD present.  Cardiovascular: Normal rate, regular rhythm and normal heart sounds.  Distant heart sounds  Pulmonary/Chest: Effort normal and breath sounds normal. No accessory muscle usage. No respiratory distress. He has no decreased breath sounds. He has no wheezes. He has no rhonchi.  Distant breath sounds  Abdominal: Soft. Bowel sounds are normal. There is no abdominal tenderness.  Musculoskeletal:        General: Edema (Trace bilateral lower extremity edema) present.     Comments: Wheelchair-bound Unable to ambulate   Lymphadenopathy:    He has no cervical adenopathy.  Neurological: He is alert and oriented to person, place, and time. Gait normal.  Skin: Skin is warm and dry. Rash noted. He is not diaphoretic. There is erythema.  Per patient he is a left hip wound that is closed with erythema, patient refuses to allow me to assess this this would require him to move  and also remove his pants  Psychiatric: Memory and judgment normal. He exhibits a depressed mood. He has a flat affect.  Nursing note and vitals reviewed.     Lab Results:  CBC    Component Value Date/Time   WBC 11.6 (H) 10/01/2018 1835   RBC 4.88 10/01/2018 1835  HGB 13.1 10/01/2018 1835   HCT 42.9 10/01/2018 1835   PLT 248 10/01/2018 1835   MCV 87.9 10/01/2018 1835   MCH 26.8 10/01/2018 1835   MCHC 30.5 10/01/2018 1835   RDW 17.5 (H) 10/01/2018 1835   LYMPHSABS 0.9 10/01/2018 1835   MONOABS 1.2 (H) 10/01/2018 1835   EOSABS 0.0 10/01/2018 1835   BASOSABS 0.0 10/01/2018 1835    BMET    Component Value Date/Time   NA 133 (L) 10/01/2018 1835   K 6.2 (H) 10/01/2018 1835   CL 99 10/01/2018 1835   CO2 23 10/01/2018 1835   GLUCOSE 103 (H) 10/01/2018 1835   BUN 50 (H) 10/01/2018 1835   CREATININE 1.50 (H) 10/01/2018 1835   CALCIUM 9.9 10/01/2018 1835   GFRNONAA 46 (L) 10/01/2018 1835   GFRAA 53 (L) 10/01/2018 1835    BNP    Component Value Date/Time   BNP 373.9 (H) 08/17/2018 1106    ProBNP No results found for: PROBNP    Assessment & Plan:    Recurrent 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 Assessment: Small right pleural effusion on chest x-ray today Probably derived from malnutrition, hepatic malfunction, and congestive heart failure  Plan: Follow-up with our office after hospitalization Present to the hospital today for further evaluation of your left hip wound  Left hip postoperative wound infection Assessment: Per patient he has a erythematous and painful left hip wound Patient refuses to let me assess this in the office today as he is in too much pain Patient is unable to ambulate due to this pain He reports the pain is been worsening over the last 2 weeks Patient has not followed up with infectious disease at [facility] He reports he has been taking his Flagyl as prescribed  Plan: Present to the emergency room at [facility] for further  evaluation Patient is afebrile today but still should be evaluated for potential sepsis from suspected left hip infection      Lauraine Rinne, NP 10/01/2018   This appointment was 30 min long with over 50% of the time in direct face-to-face patient care, assessment, plan of care, and follow-up.

## 2018-10-01 ENCOUNTER — Encounter: Payer: Self-pay | Admitting: Pulmonary Disease

## 2018-10-01 ENCOUNTER — Emergency Department (HOSPITAL_COMMUNITY): Payer: Medicare Other

## 2018-10-01 ENCOUNTER — Encounter (HOSPITAL_COMMUNITY): Payer: Self-pay | Admitting: Emergency Medicine

## 2018-10-01 ENCOUNTER — Ambulatory Visit (INDEPENDENT_AMBULATORY_CARE_PROVIDER_SITE_OTHER): Payer: Medicare Other | Admitting: Pulmonary Disease

## 2018-10-01 ENCOUNTER — Other Ambulatory Visit: Payer: Self-pay | Admitting: Pulmonary Disease

## 2018-10-01 ENCOUNTER — Inpatient Hospital Stay (HOSPITAL_COMMUNITY)
Admission: EM | Admit: 2018-10-01 | Discharge: 2018-12-07 | DRG: 559 | Disposition: A | Discharge status: Skilled Nursing Facility | Payer: Medicare Other | Source: Ambulatory Visit | Attending: Internal Medicine | Admitting: Internal Medicine

## 2018-10-01 ENCOUNTER — Ambulatory Visit (INDEPENDENT_AMBULATORY_CARE_PROVIDER_SITE_OTHER)
Admission: RE | Admit: 2018-10-01 | Discharge: 2018-10-01 | Disposition: A | Discharge status: Home / Self Care | Payer: Medicare Other | Source: Ambulatory Visit | Attending: Pulmonary Disease | Admitting: Pulmonary Disease

## 2018-10-01 ENCOUNTER — Other Ambulatory Visit: Payer: Self-pay

## 2018-10-01 VITALS — BP 138/84 | HR 86 | Temp 98.4°F

## 2018-10-01 DIAGNOSIS — K5903 Drug induced constipation: Secondary | ICD-10-CM | POA: Diagnosis not present

## 2018-10-01 DIAGNOSIS — R0602 Shortness of breath: Secondary | ICD-10-CM

## 2018-10-01 DIAGNOSIS — J90 Pleural effusion, not elsewhere classified: Secondary | ICD-10-CM | POA: Diagnosis not present

## 2018-10-01 DIAGNOSIS — M25559 Pain in unspecified hip: Secondary | ICD-10-CM

## 2018-10-01 DIAGNOSIS — I110 Hypertensive heart disease with heart failure: Secondary | ICD-10-CM | POA: Diagnosis present

## 2018-10-01 DIAGNOSIS — Z9884 Bariatric surgery status: Secondary | ICD-10-CM

## 2018-10-01 DIAGNOSIS — E871 Hypo-osmolality and hyponatremia: Secondary | ICD-10-CM | POA: Diagnosis not present

## 2018-10-01 DIAGNOSIS — Z7901 Long term (current) use of anticoagulants: Secondary | ICD-10-CM

## 2018-10-01 DIAGNOSIS — M19011 Primary osteoarthritis, right shoulder: Secondary | ICD-10-CM | POA: Diagnosis present

## 2018-10-01 DIAGNOSIS — Z811 Family history of alcohol abuse and dependence: Secondary | ICD-10-CM

## 2018-10-01 DIAGNOSIS — I480 Paroxysmal atrial fibrillation: Secondary | ICD-10-CM | POA: Diagnosis present

## 2018-10-01 DIAGNOSIS — G4700 Insomnia, unspecified: Secondary | ICD-10-CM | POA: Diagnosis present

## 2018-10-01 DIAGNOSIS — E875 Hyperkalemia: Secondary | ICD-10-CM

## 2018-10-01 DIAGNOSIS — L03116 Cellulitis of left lower limb: Secondary | ICD-10-CM | POA: Diagnosis not present

## 2018-10-01 DIAGNOSIS — Z9989 Dependence on other enabling machines and devices: Secondary | ICD-10-CM

## 2018-10-01 DIAGNOSIS — Z6841 Body Mass Index (BMI) 40.0 and over, adult: Secondary | ICD-10-CM

## 2018-10-01 DIAGNOSIS — M25519 Pain in unspecified shoulder: Secondary | ICD-10-CM

## 2018-10-01 DIAGNOSIS — J939 Pneumothorax, unspecified: Secondary | ICD-10-CM

## 2018-10-01 DIAGNOSIS — T8149XA Infection following a procedure, other surgical site, initial encounter: Secondary | ICD-10-CM

## 2018-10-01 DIAGNOSIS — I4891 Unspecified atrial fibrillation: Secondary | ICD-10-CM | POA: Diagnosis present

## 2018-10-01 DIAGNOSIS — F329 Major depressive disorder, single episode, unspecified: Secondary | ICD-10-CM | POA: Diagnosis present

## 2018-10-01 DIAGNOSIS — Z91048 Other nonmedicinal substance allergy status: Secondary | ICD-10-CM

## 2018-10-01 DIAGNOSIS — I482 Chronic atrial fibrillation, unspecified: Secondary | ICD-10-CM | POA: Diagnosis present

## 2018-10-01 DIAGNOSIS — K219 Gastro-esophageal reflux disease without esophagitis: Secondary | ICD-10-CM | POA: Diagnosis present

## 2018-10-01 DIAGNOSIS — Z91030 Bee allergy status: Secondary | ICD-10-CM

## 2018-10-01 DIAGNOSIS — K5900 Constipation, unspecified: Secondary | ICD-10-CM

## 2018-10-01 DIAGNOSIS — B966 Bacteroides fragilis [B. fragilis] as the cause of diseases classified elsewhere: Secondary | ICD-10-CM | POA: Diagnosis present

## 2018-10-01 DIAGNOSIS — Z66 Do not resuscitate: Secondary | ICD-10-CM | POA: Diagnosis not present

## 2018-10-01 DIAGNOSIS — Z515 Encounter for palliative care: Secondary | ICD-10-CM

## 2018-10-01 DIAGNOSIS — N179 Acute kidney failure, unspecified: Secondary | ICD-10-CM | POA: Diagnosis not present

## 2018-10-01 DIAGNOSIS — A419 Sepsis, unspecified organism: Secondary | ICD-10-CM | POA: Diagnosis present

## 2018-10-01 DIAGNOSIS — G622 Polyneuropathy due to other toxic agents: Secondary | ICD-10-CM | POA: Diagnosis present

## 2018-10-01 DIAGNOSIS — Z95828 Presence of other vascular implants and grafts: Secondary | ICD-10-CM

## 2018-10-01 DIAGNOSIS — T8452XA Infection and inflammatory reaction due to internal left hip prosthesis, initial encounter: Secondary | ICD-10-CM | POA: Diagnosis not present

## 2018-10-01 DIAGNOSIS — Z87891 Personal history of nicotine dependence: Secondary | ICD-10-CM

## 2018-10-01 DIAGNOSIS — N170 Acute kidney failure with tubular necrosis: Secondary | ICD-10-CM | POA: Diagnosis present

## 2018-10-01 DIAGNOSIS — I9589 Other hypotension: Secondary | ICD-10-CM | POA: Diagnosis not present

## 2018-10-01 DIAGNOSIS — F32A Depression, unspecified: Secondary | ICD-10-CM | POA: Diagnosis present

## 2018-10-01 DIAGNOSIS — T378X5A Adverse effect of other specified systemic anti-infectives and antiparasitics, initial encounter: Secondary | ICD-10-CM | POA: Diagnosis present

## 2018-10-01 DIAGNOSIS — E860 Dehydration: Secondary | ICD-10-CM | POA: Diagnosis present

## 2018-10-01 DIAGNOSIS — Y831 Surgical operation with implant of artificial internal device as the cause of abnormal reaction of the patient, or of later complication, without mention of misadventure at the time of the procedure: Secondary | ICD-10-CM | POA: Diagnosis present

## 2018-10-01 DIAGNOSIS — Z7189 Other specified counseling: Secondary | ICD-10-CM

## 2018-10-01 DIAGNOSIS — R52 Pain, unspecified: Secondary | ICD-10-CM

## 2018-10-01 DIAGNOSIS — R5381 Other malaise: Secondary | ICD-10-CM

## 2018-10-01 DIAGNOSIS — Z809 Family history of malignant neoplasm, unspecified: Secondary | ICD-10-CM

## 2018-10-01 DIAGNOSIS — M549 Dorsalgia, unspecified: Secondary | ICD-10-CM

## 2018-10-01 DIAGNOSIS — I5032 Chronic diastolic (congestive) heart failure: Secondary | ICD-10-CM | POA: Diagnosis present

## 2018-10-01 DIAGNOSIS — B009 Herpesviral infection, unspecified: Secondary | ICD-10-CM | POA: Diagnosis not present

## 2018-10-01 DIAGNOSIS — G4733 Obstructive sleep apnea (adult) (pediatric): Secondary | ICD-10-CM | POA: Diagnosis present

## 2018-10-01 DIAGNOSIS — F319 Bipolar disorder, unspecified: Secondary | ICD-10-CM | POA: Diagnosis present

## 2018-10-01 DIAGNOSIS — J948 Other specified pleural conditions: Secondary | ICD-10-CM | POA: Diagnosis present

## 2018-10-01 DIAGNOSIS — G8929 Other chronic pain: Secondary | ICD-10-CM

## 2018-10-01 DIAGNOSIS — K7469 Other cirrhosis of liver: Secondary | ICD-10-CM

## 2018-10-01 LAB — URINALYSIS, ROUTINE W REFLEX MICROSCOPIC
Bilirubin Urine: NEGATIVE
Glucose, UA: NEGATIVE mg/dL
Hgb urine dipstick: NEGATIVE
Ketones, ur: NEGATIVE mg/dL
Leukocytes, UA: NEGATIVE
Nitrite: NEGATIVE
Protein, ur: NEGATIVE mg/dL
Specific Gravity, Urine: 1.01 (ref 1.005–1.030)
pH: 5 (ref 5.0–8.0)

## 2018-10-01 LAB — COMPREHENSIVE METABOLIC PANEL
ALT: 11 U/L (ref 0–44)
AST: 19 U/L (ref 15–41)
Albumin: 3.1 g/dL — ABNORMAL LOW (ref 3.5–5.0)
Alkaline Phosphatase: 56 U/L (ref 38–126)
Anion gap: 11 (ref 5–15)
BUN: 50 mg/dL — ABNORMAL HIGH (ref 8–23)
CO2: 23 mmol/L (ref 22–32)
Calcium: 9.9 mg/dL (ref 8.9–10.3)
Chloride: 99 mmol/L (ref 98–111)
Creatinine, Ser: 1.5 mg/dL — ABNORMAL HIGH (ref 0.61–1.24)
GFR calc Af Amer: 53 mL/min — ABNORMAL LOW (ref >60)
GFR calc non Af Amer: 46 mL/min — ABNORMAL LOW (ref >60)
Glucose, Bld: 103 mg/dL — ABNORMAL HIGH (ref 70–99)
Potassium: 6.2 mmol/L — ABNORMAL HIGH (ref 3.5–5.1)
Sodium: 133 mmol/L — ABNORMAL LOW (ref 135–145)
Total Bilirubin: 0.5 mg/dL (ref 0.3–1.2)
Total Protein: 7.4 g/dL (ref 6.5–8.1)

## 2018-10-01 LAB — CBC WITH DIFFERENTIAL/PLATELET
Abs Immature Granulocytes: 0.05 10*3/uL (ref 0.00–0.07)
Basophils Absolute: 0 10*3/uL (ref 0.0–0.1)
Basophils Relative: 0 %
Eosinophils Absolute: 0 10*3/uL (ref 0.0–0.5)
Eosinophils Relative: 0 %
HCT: 42.9 % (ref 39.0–52.0)
Hemoglobin: 13.1 g/dL (ref 13.0–17.0)
Immature Granulocytes: 0 %
Lymphocytes Relative: 8 %
Lymphs Abs: 0.9 10*3/uL (ref 0.7–4.0)
MCH: 26.8 pg (ref 26.0–34.0)
MCHC: 30.5 g/dL (ref 30.0–36.0)
MCV: 87.9 fL (ref 80.0–100.0)
Monocytes Absolute: 1.2 10*3/uL — ABNORMAL HIGH (ref 0.1–1.0)
Monocytes Relative: 11 %
Neutro Abs: 9.4 10*3/uL — ABNORMAL HIGH (ref 1.7–7.7)
Neutrophils Relative %: 81 %
Platelets: 248 10*3/uL (ref 150–400)
RBC: 4.88 MIL/uL (ref 4.22–5.81)
RDW: 17.5 % — ABNORMAL HIGH (ref 11.5–15.5)
WBC: 11.6 10*3/uL — ABNORMAL HIGH (ref 4.0–10.5)
nRBC: 0 % (ref 0.0–0.2)

## 2018-10-01 LAB — SEDIMENTATION RATE: Sed Rate: 88 mm/hr — ABNORMAL HIGH (ref 0–16)

## 2018-10-01 LAB — LACTIC ACID, PLASMA: Lactic Acid, Venous: 1.7 mmol/L (ref 0.5–1.9)

## 2018-10-01 LAB — POTASSIUM: Potassium: 5.7 mmol/L — ABNORMAL HIGH (ref 3.5–5.1)

## 2018-10-01 LAB — C-REACTIVE PROTEIN: CRP: 10.7 mg/dL — ABNORMAL HIGH (ref <1.0)

## 2018-10-01 MED ORDER — SODIUM CHLORIDE 0.9 % IV SOLN
2.0000 g | Freq: Three times a day (TID) | INTRAVENOUS | Status: DC
  Administered 2018-10-02: 2 g via INTRAVENOUS
  Filled 2018-10-01 (×2): qty 2

## 2018-10-01 MED ORDER — ONDANSETRON HCL 4 MG/2ML IJ SOLN
4.0000 mg | Freq: Once | INTRAMUSCULAR | Status: AC
  Administered 2018-10-01: 4 mg via INTRAVENOUS
  Filled 2018-10-01: qty 2

## 2018-10-01 MED ORDER — HYDROXYZINE HCL 25 MG PO TABS
25.0000 mg | ORAL_TABLET | Freq: Three times a day (TID) | ORAL | Status: DC | PRN
  Administered 2018-10-02 – 2018-11-07 (×64): 25 mg via ORAL
  Filled 2018-10-01 (×67): qty 1

## 2018-10-01 MED ORDER — SODIUM CHLORIDE 0.9 % IV BOLUS: 500.0000 mL | Freq: Once | INTRAVENOUS | Status: DC

## 2018-10-01 MED ORDER — ACETAMINOPHEN 325 MG PO TABS: 650.0000 mg | ORAL_TABLET | Freq: Four times a day (QID) | ORAL | Status: DC | PRN

## 2018-10-01 MED ORDER — PANTOPRAZOLE SODIUM 40 MG PO TBEC
40.0000 mg | DELAYED_RELEASE_TABLET | Freq: Every day | ORAL | Status: DC
  Administered 2018-10-02 – 2018-12-07 (×67): 40 mg via ORAL
  Filled 2018-10-01 (×67): qty 1

## 2018-10-01 MED ORDER — METRONIDAZOLE IN NACL 5-0.79 MG/ML-% IV SOLN
500.0000 mg | Freq: Three times a day (TID) | INTRAVENOUS | Status: DC
  Administered 2018-10-01 – 2018-10-03 (×6): 500 mg via INTRAVENOUS
  Filled 2018-10-01 (×6): qty 100

## 2018-10-01 MED ORDER — FENTANYL CITRATE (PF) 100 MCG/2ML IJ SOLN
50.0000 ug | Freq: Once | INTRAMUSCULAR | Status: AC
  Administered 2018-10-01: 50 ug via INTRAVENOUS
  Filled 2018-10-01: qty 2

## 2018-10-01 MED ORDER — SODIUM CHLORIDE 0.9 % IV SOLN
2.0000 g | Freq: Once | INTRAVENOUS | Status: AC
  Administered 2018-10-01: 2 g via INTRAVENOUS
  Filled 2018-10-01: qty 2

## 2018-10-01 MED ORDER — VITAMIN B-1 100 MG PO TABS
100.0000 mg | ORAL_TABLET | Freq: Every day | ORAL | Status: DC
  Administered 2018-10-02 – 2018-12-07 (×67): 100 mg via ORAL
  Filled 2018-10-01 (×67): qty 1

## 2018-10-01 MED ORDER — METOPROLOL TARTRATE 25 MG PO TABS: 25.0000 mg | ORAL_TABLET | Freq: Two times a day (BID) | ORAL | Status: DC

## 2018-10-01 MED ORDER — IOHEXOL 300 MG/ML  SOLN
100.0000 mL | Freq: Once | INTRAMUSCULAR | Status: AC | PRN
  Administered 2018-10-01: 100 mL via INTRAVENOUS

## 2018-10-01 MED ORDER — SODIUM CHLORIDE 0.9% FLUSH
3.0000 mL | Freq: Two times a day (BID) | INTRAVENOUS | Status: DC
  Administered 2018-10-02 – 2018-10-03 (×3): 3 mL via INTRAVENOUS

## 2018-10-01 MED ORDER — ONDANSETRON HCL 4 MG PO TABS: 4.0000 mg | ORAL_TABLET | Freq: Four times a day (QID) | ORAL | Status: DC | PRN

## 2018-10-01 MED ORDER — ONDANSETRON HCL 4 MG/2ML IJ SOLN
4.0000 mg | Freq: Four times a day (QID) | INTRAMUSCULAR | Status: DC | PRN
  Administered 2018-10-04 – 2018-12-06 (×70): 4 mg via INTRAVENOUS
  Filled 2018-10-01 (×71): qty 2

## 2018-10-01 MED ORDER — GABAPENTIN 100 MG PO CAPS
100.0000 mg | ORAL_CAPSULE | Freq: Three times a day (TID) | ORAL | Status: DC
  Administered 2018-10-02 – 2018-12-05 (×196): 100 mg via ORAL
  Filled 2018-10-01 (×197): qty 1

## 2018-10-01 MED ORDER — VANCOMYCIN HCL 10 G IV SOLR
2500.0000 mg | INTRAVENOUS | Status: AC
  Administered 2018-10-01: 2500 mg via INTRAVENOUS
  Filled 2018-10-01: qty 2500

## 2018-10-01 MED ORDER — POLYETHYLENE GLYCOL 3350 17 G PO PACK
17.0000 g | PACK | Freq: Every day | ORAL | Status: DC | PRN
  Administered 2018-10-06 – 2018-10-23 (×4): 17 g via ORAL
  Filled 2018-10-01 (×4): qty 1

## 2018-10-01 MED ORDER — SODIUM ZIRCONIUM CYCLOSILICATE 10 G PO PACK
10.0000 g | PACK | Freq: Once | ORAL | Status: DC
  Filled 2018-10-01: qty 1

## 2018-10-01 MED ORDER — DILTIAZEM HCL ER COATED BEADS 180 MG PO CP24
180.0000 mg | ORAL_CAPSULE | Freq: Every day | ORAL | Status: DC
  Administered 2018-10-03: 180 mg via ORAL
  Filled 2018-10-01 (×2): qty 1

## 2018-10-01 MED ORDER — ALBUTEROL SULFATE (2.5 MG/3ML) 0.083% IN NEBU: 2.5000 mg | INHALATION_SOLUTION | Freq: Four times a day (QID) | RESPIRATORY_TRACT | Status: DC | PRN

## 2018-10-01 MED ORDER — SODIUM CHLORIDE 0.9 % IV SOLN
INTRAVENOUS | Status: AC
  Administered 2018-10-02: 01:00:00 via INTRAVENOUS

## 2018-10-01 MED ORDER — ROPINIROLE HCL 0.25 MG PO TABS
0.2500 mg | ORAL_TABLET | Freq: Every day | ORAL | Status: DC
  Administered 2018-10-02: 0.25 mg via ORAL
  Filled 2018-10-01 (×2): qty 1

## 2018-10-01 MED ORDER — MELATONIN 3 MG PO TABS
6.0000 mg | ORAL_TABLET | Freq: Every day | ORAL | Status: DC
  Administered 2018-10-02 – 2018-12-06 (×64): 6 mg via ORAL
  Filled 2018-10-01 (×68): qty 2

## 2018-10-01 MED ORDER — APIXABAN 5 MG PO TABS
5.0000 mg | ORAL_TABLET | Freq: Two times a day (BID) | ORAL | Status: DC
  Administered 2018-10-02 – 2018-10-13 (×24): 5 mg via ORAL
  Filled 2018-10-01 (×25): qty 1

## 2018-10-01 MED ORDER — OXYCODONE HCL 5 MG PO TABS
5.0000 mg | ORAL_TABLET | Freq: Four times a day (QID) | ORAL | Status: DC | PRN
  Administered 2018-10-02 – 2018-10-17 (×42): 5 mg via ORAL
  Filled 2018-10-01 (×43): qty 1

## 2018-10-01 MED ORDER — CYCLOSPORINE 0.05 % OP EMUL
1.0000 [drp] | Freq: Two times a day (BID) | OPHTHALMIC | Status: DC
  Administered 2018-10-02 – 2018-12-06 (×127): 1 [drp] via OPHTHALMIC
  Filled 2018-10-01 (×134): qty 1

## 2018-10-01 MED ORDER — VANCOMYCIN HCL IN DEXTROSE 1-5 GM/200ML-% IV SOLN
1000.0000 mg | Freq: Once | INTRAVENOUS | Status: DC
  Filled 2018-10-01: qty 200

## 2018-10-01 MED ORDER — ACETAMINOPHEN 650 MG RE SUPP: 650.0000 mg | Freq: Four times a day (QID) | RECTAL | Status: DC | PRN

## 2018-10-01 MED ORDER — LIDOCAINE 5 % EX PTCH
1.0000 | MEDICATED_PATCH | CUTANEOUS | Status: DC
  Administered 2018-10-02 – 2018-12-06 (×64): 1 via TRANSDERMAL
  Filled 2018-10-01 (×66): qty 1

## 2018-10-01 MED ORDER — METOPROLOL TARTRATE 25 MG PO TABS
25.0000 mg | ORAL_TABLET | Freq: Two times a day (BID) | ORAL | Status: DC
  Administered 2018-10-02 – 2018-10-06 (×8): 25 mg via ORAL
  Filled 2018-10-01 (×12): qty 1

## 2018-10-01 MED ORDER — VANCOMYCIN HCL 10 G IV SOLR
2000.0000 mg | INTRAVENOUS | Status: DC
  Administered 2018-10-03 – 2018-10-04 (×2): 2000 mg via INTRAVENOUS
  Filled 2018-10-01 (×3): qty 2000

## 2018-10-01 MED ORDER — ESCITALOPRAM OXALATE 10 MG PO TABS
10.0000 mg | ORAL_TABLET | Freq: Every day | ORAL | Status: DC
  Administered 2018-10-02 – 2018-12-07 (×67): 10 mg via ORAL
  Filled 2018-10-01 (×67): qty 1

## 2018-10-01 MED ORDER — FENTANYL CITRATE (PF) 100 MCG/2ML IJ SOLN
100.0000 ug | Freq: Once | INTRAMUSCULAR | Status: AC
  Administered 2018-10-01: 100 ug via INTRAVENOUS
  Filled 2018-10-01: qty 2

## 2018-10-01 NOTE — ED Triage Notes (Signed)
Patient transported to [facility] ED from [facility] with reports of possible hip infection.

## 2018-10-01 NOTE — Assessment & Plan Note (Signed)
Assessment: Per patient he has a erythematous and painful left hip wound Patient refuses to let me assess this in the office today as he is in too much pain Patient is unable to ambulate due to this pain He reports the pain is been worsening over the last 2 weeks Patient has not followed up with infectious disease at [facility] He reports he has been taking his Flagyl as prescribed  Plan: Present to the emergency room at [facility] for further evaluation Patient is afebrile today but still should be evaluated for potential sepsis from suspected left hip infection

## 2018-10-01 NOTE — ED Notes (Signed)
Pt c/o lower back pain and shoulder pain, st's it comes from arthritis.  Pain med given for same

## 2018-10-01 NOTE — Assessment & Plan Note (Signed)
Assessment: Small right pleural effusion on chest x-ray today Probably derived from malnutrition, hepatic malfunction, and congestive heart failure  Plan: Follow-up with our office after hospitalization Present to the hospital today for further evaluation of your left hip wound

## 2018-10-01 NOTE — Progress Notes (Signed)
Discussed with patient in office.  Due to patient's worsening symptoms regarding left hip wound.  Patient recommended to go to the emergency room for further evaluation.  Wyn Quaker FNP

## 2018-10-01 NOTE — Patient Instructions (Signed)
Go to [facility] emergency department for further evaluation regarding your left hip wound  Contact our office and schedule a follow-up visit with Dr. Valeta Harms whenever you were discharged from the hospital or the emergency department  It is flu season:   >>>Remember to be washing your hands regularly, using hand sanitizer, be careful to use around herself with has contact with people who are sick will increase her chances of getting sick yourself. >>> Best ways to protect herself from the flu: Receive the yearly flu vaccine, practice good hand hygiene washing with soap and also using hand sanitizer when available, eat a nutritious meals, get adequate rest, hydrate appropriately   Please contact the office if your symptoms worsen or you have concerns that you are not improving.   Thank you for choosing [facility] for your healthcare, and for allowing [place] to partner with you on your healthcare journey. I am thankful to be able to provide care to you today.   Wyn Quaker FNP-C

## 2018-10-01 NOTE — Progress Notes (Addendum)
Pharmacy Antibiotic Note  [name] is a 73 y.o. male admitted on 10/01/2018 with sepsis.  Pharmacy has been consulted for Vanco/Cefepime dosing.  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 H/o prosthetic joint infection. Follows with ID at [facility], completed a course of ertapenem and continued on Flagyl po through November 19, 2018  - WBC 11.6, LA 1.7  Vancomycin 2000 mg IV Q 36 hrs. Goal AUC 400-550. Expected AUC: 491 SCr used: 1.5  Plan: Vanco 2500mg  IV in ED then 2g IV q 36 hrs. Vanco levels after 3-5 doses at steady state. Cefepime 2g IV x 1 in ED then 2g IV q  8hrs    Height: 5\' 8"  (172.7 cm) Weight: 298 lb (135.2 kg) IBW/kg (Calculated) : 68.4  Temp (24hrs), Avg:98.6 F (37 C), Min:98.4 F (36.9 C), Max:98.8 F (37.1 C)  Recent Labs  Lab 10/01/18 1833 10/01/18 1835  WBC  --  11.6*  CREATININE  --  1.50*  LATICACIDVEN 1.7  --     Estimated Creatinine Clearance: 59.9 mL/min (A) (by C-G formula based on SCr of 1.5 mg/dL (H)).    Allergies  Allergen Reactions  . Bee Venom Swelling  . Nickel Rash    [name], PharmD, [place] Clinical Staff Pharmacist  [place], [place] 10/01/2018 7:40 PM

## 2018-10-01 NOTE — ED Provider Notes (Signed)
[facility]ERGENCY DEPARTMENT Provider Note   CSN: [id number] Arrival date & time: 10/01/18  1705     History   Chief Complaint No chief complaint on file.   HPI [name] is a 73 y.o. male.  The history is provided by the patient and medical records. No language interpreter was used.     73 year old male with hip prosthesis, recurrent left hip postoperative wound infection, atrial fibrillation, morbid obesity, brought here via EMS from [place] office for concern of hip infection.  Patient report he developed left hip prosthesis infection several months ago in which he was hospitalized for an extensive period of time.  During his hospitalization there was complication in which he was confused, fell and broke his left hip again.  His orthopedist is Dr. Wynelle Link.  During hospitalization he also developed pleural effusion.  His symptoms did get better and he subsequently was discharged.  Patient states for the past week and a half he developed worsening pain to his left hip.  Pain is described as a stinging sensation, moderate in severity and has been persistent.  Pain is nonradiating, he feels weak and tired.  He is mostly bedbound at home.  He does not complain of any fever or chills.  Endorse persistent nausea without vomiting.  No chest pain shortness of breath abdominal pain.  No dysuria.  He was evaluated by pulmonologist for a follow-up today and when he discussed about his hip pain, they want him to come to the ER for further management.  Patient mentioned he did try to follow-up with his PCP.  Was having trouble with transportation.  Past Medical History:  Diagnosis Date  . Anginal pain ([place]) 2006   evaluated by cardio  . Arthritis    knees,feet,shoulders,elbows.hands  . Constipation   . Dyspnea    with exertion   . Dysrhythmia    Atrial Flutter- 2006- corredted itself  . Family history of adverse reaction to anesthesia    mother- with novocaine went  into shock  . GERD (gastroesophageal reflux disease)   . Headache   . Hemorrhoids   . History of blood transfusion   . Hypertension   . Insomnia   . Sleep apnea    cpap  . 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 Physical debility 09/04/2018  . Pleural effusion, bilateral   . Hypertensive urgency 08/17/2018  . Pleural effusion on left 08/17/2018  . Chronic diastolic CHF (congestive heart failure) ([place]) 08/17/2018  . OSA on CPAP 08/17/2018  . Unspecified atrial fibrillation ([place])   . Delirium   . Depression   . OSA (obstructive sleep apnea)   . Obesity, Class III, BMI 40-49.9 (morbid obesity) ([place])   . HCAP (healthcare-associated pneumonia) 04/16/2018  . Atrial fibrillation with RVR ([place]) 04/16/2018  . Morbid obesity with BMI of 50.0-59.9, adult ([place]) 04/16/2018  . Acute kidney injury ([place]) 04/16/2018  . Visual hallucination 04/16/2018  . SIRS (systemic inflammatory response syndrome) ([place]) 04/16/2018  . Anemia of chronic disease 04/16/2018  . Left hip postoperative wound infection 03/15/2018  . 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 Femur fracture, left ([place]) 02/15/2018  . Anemia 02/15/2018  . Femur fracture ([place]) 02/15/2018  . 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 SOB (shortness of breath) 03/31/2014  . Hypertension   . Hyponatremia 03/18/2012  .  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[place]) 03/17/2012    Past Surgical History:  Procedure Laterality Date  . APPENDECTOMY    . ARTERY BIOPSY Left 12/30/2017   Procedure: BIOPSY TEMPORAL ARTERY;  Surgeon: Fanny Skates, MD;  Location: [facility];  Service: General;  Laterality: Left;  . CHOLECYSTECTOMY    . COLONOSCOPY W/ POLYPECTOMY    . ESOPHAGOGASTRODUODENOSCOPY (EGD) WITH PROPOFOL  07/06/2012   Procedure: ESOPHAGOGASTRODUODENOSCOPY (EGD) WITH PROPOFOL;  Surgeon: Jeryl Columbia,  MD;  Location: [facility];  Service: Endoscopy;  Laterality: N/A;  . ESOPHAGOSCOPY    . EYE SURGERY     left eye- muscle repair  . HERNIA REPAIR     ventral hernia  . INCISION AND DRAINAGE HIP Left 03/17/2018   Procedure: IRRIGATION AND DEBRIDEMENT LEFT THIGH WOUND;  Surgeon: Gaynelle Arabian, MD;  Location: [facility];  Service: Orthopedics;  Laterality: Left;  . JOINT REPLACEMENT     bilateral hips.  Right broke and had to be re placed again  . MASS EXCISION N/A 03/02/2015   Procedure: EXCISION ABDOMINAL WALL MASS;  Surgeon: Alphonsa Overall, MD;  Location: [facility];  Service: General;  Laterality: N/A;  . TOTAL HIP REVISION Left 02/17/2018   Procedure: Left total hip arthroplasty revision;  Surgeon: Gaynelle Arabian, MD;  Location: [facility];  Service: Orthopedics;  Laterality: Left;  Marland Kitchen VERTICAL BANDED GASTROPLASTY          Home Medications    Prior to Admission medications   Medication Sig Start Date End Date Taking? Authorizing Provider  acetaminophen (TYLENOL) 325 MG tablet Take 2 tablets (650 mg total) by mouth every 6 (six) hours as needed for mild pain, fever or headache. Patient not taking: Reported on 10/01/2018 04/27/18   Barton Dubois, MD  albuterol (PROVENTIL HFA;VENTOLIN HFA) 108 [phone] Base) MCG/ACT inhaler Inhale 1-2 puffs into the lungs every 6 (six) hours as needed for wheezing or shortness of breath. 09/02/18   Hongalgi, Lenis Dickinson, MD  apixaban (ELIQUIS) 5 MG TABS tablet Take 1 tablet (5 mg total) by mouth 2 (two) times daily. 09/02/18   Hongalgi, Lenis Dickinson, MD  calcium carbonate (TUMS - DOSED IN MG ELEMENTAL CALCIUM) 500 MG chewable tablet Chew 1,000 mg by mouth 3 (three) times daily as needed for indigestion.    [provider]  carboxymethylcellulose (REFRESH TEARS) 0.5 % SOLN Place 1 drop into both eyes daily as needed (dry eye/irritation). 09/02/18   Hongalgi, Lenis Dickinson, MD  Cholecalciferol (VITAMIN D3) 2000 units TABS Take 2,000 Units by mouth every evening.     [provider]    cycloSPORINE (RESTASIS) 0.05 % ophthalmic emulsion Place 1 drop into both eyes 2 (two) times daily. 09/02/18   Hongalgi, Lenis Dickinson, MD  diltiazem (CARDIZEM CD) 180 MG 24 hr capsule Take 1 capsule (180 mg total) by mouth daily. 09/03/18   Hongalgi, Lenis Dickinson, MD  escitalopram (LEXAPRO) 10 MG tablet Take 1 tablet (10 mg total) by mouth daily. 09/02/18   Hongalgi, Lenis Dickinson, MD  ferrous sulfate 325 (65 FE) MG tablet Take 1 tablet (325 mg total) by mouth 3 (three) times daily with meals. 09/02/18   Hongalgi, Lenis Dickinson, MD  furosemide (LASIX) 20 MG tablet Take 3 tablets (60 mg total) by mouth daily. 09/11/18   Bonnielee Haff, MD  gabapentin (NEURONTIN) 100 MG capsule Take 1 capsule (100 mg total) by mouth 3 (three) times daily. 09/02/18   Hongalgi, Lenis Dickinson, MD  hydrOXYzine (ATARAX/VISTARIL) 25 MG tablet Take 1 tablet (25  mg total) by mouth 3 (three) times daily as needed for itching or anxiety. 09/11/18   Bonnielee Haff, MD  lidocaine (LIDODERM) 5 % Place 1 patch onto the skin daily. Remove & Discard patch within 12 hours or as directed by MD 09/11/18   Bonnielee Haff, MD  Melatonin 3 MG TABS Take 2 tablets (6 mg total) by mouth at bedtime. 09/11/18   Bonnielee Haff, MD  metoprolol tartrate (LOPRESSOR) 25 MG tablet Take 1 tablet (25 mg total) by mouth 2 (two) times daily. 09/02/18   Hongalgi, Lenis Dickinson, MD  metroNIDAZOLE (FLAGYL) 500 MG tablet Take 1 tablet (500 mg total) by mouth 2 (two) times daily. 09/11/18   Bonnielee Haff, MD  Multiple Vitamins-Minerals (CENTRUM ADULTS PO) Take 1 tablet by mouth every evening.     [provider]  oxyCODONE (OXY IR/ROXICODONE) 5 MG immediate release tablet Take 1 tablet (5 mg total) by mouth every 6 (six) hours as needed for moderate pain, severe pain or breakthrough pain. 09/11/18   Bonnielee Haff, MD  pantoprazole (PROTONIX) 40 MG tablet Take 1 tablet (40 mg total) by mouth daily. 09/02/18   Hongalgi, Lenis Dickinson, MD  polyethylene glycol (MIRALAX / GLYCOLAX) packet Take 17 g by mouth  daily as needed for mild constipation. 09/02/18   Hongalgi, Lenis Dickinson, MD  potassium chloride SA (K-DUR,KLOR-CON) 20 MEQ tablet Take 1 tablet (20 mEq total) by mouth daily. 09/03/18   Hongalgi, Lenis Dickinson, MD  rOPINIRole (REQUIP) 0.25 MG tablet Take 1 tablet (0.25 mg total) by mouth at bedtime. 09/02/18   Hongalgi, Lenis Dickinson, MD  senna-docusate (SENOKOT-S) 8.6-50 MG tablet Take 2 tablets by mouth 2 (two) times daily. Patient not taking: Reported on 10/01/2018 09/02/18   Modena Jansky, MD  spironolactone (ALDACTONE) 50 MG tablet Take 100mg  every morning and 50mg  every evening 09/11/18   Bonnielee Haff, MD  thiamine 100 MG tablet Take 100 mg by mouth daily. 07/15/18   [provider]    Family History Family History  Problem Relation Age of Onset  . Cancer Mother   . Alcohol abuse Father     Social History Social History   Tobacco Use  . Smoking status: Former Smoker    Packs/day: 2.00    Years: 10.00    Pack years: 20.00    Types: Cigarettes    Start date: 05/16/1966    Last attempt to quit: 03/11/1984    Years since quitting: 34.5  . Smokeless tobacco: Never Used  Substance Use Topics  . Alcohol use: Yes    Alcohol/week: 0.0 standard drinks    Comment: occasionally a couple of times a year   . Drug use: No     Allergies   Bee venom and Nickel   Review of Systems Review of Systems  All other systems reviewed and are negative.    Physical Exam Updated Vital Signs BP (!) 107/48   Pulse 97   Temp 98.8 F (37.1 C) (Oral)   Resp 20   Ht 5\' 8"  (1.727 m)   Wt 135.2 kg   SpO2 100%   BMI 45.31 kg/m   Physical Exam Vitals signs and nursing note reviewed.  Constitutional:      General: He is not in acute distress.    Appearance: He is well-developed. He is obese.  HENT:     Head: Atraumatic.  Eyes:     Conjunctiva/sclera: Conjunctivae normal.  Neck:     Musculoskeletal: Neck supple.  Cardiovascular:  Rate and Rhythm: Tachycardia present.  Pulmonary:      Effort: Pulmonary effort is normal.     Breath sounds: Normal breath sounds. No wheezing.  Abdominal:     Palpations: Abdomen is soft.     Tenderness: There is no abdominal tenderness.  Skin:    Findings: Rash (Left hip: Skin erythema, edema and warmth noted along the lateral aspect of hip approximately 20 cm x 6cm in diameter tender to palpation.  Decreased hip range of motion secondary to pain.) present.  Neurological:     Mental Status: He is alert.      ED Treatments / Results  Labs (all labs ordered are listed, but only abnormal results are displayed) Labs Reviewed  COMPREHENSIVE METABOLIC PANEL - Abnormal; Notable for the following components:      Result Value   Sodium 133 (*)    Potassium 6.2 (*)    Glucose, Bld 103 (*)    BUN 50 (*)    Creatinine, Ser 1.50 (*)    Albumin 3.1 (*)    GFR calc non Af Amer 46 (*)    GFR calc Af Amer 53 (*)    All other components within normal limits  CBC WITH DIFFERENTIAL/PLATELET - Abnormal; Notable for the following components:   WBC 11.6 (*)    RDW 17.5 (*)    Neutro Abs 9.4 (*)    Monocytes Absolute 1.2 (*)    All other components within normal limits  CULTURE, BLOOD (ROUTINE X 2)  CULTURE, BLOOD (ROUTINE X 2)  URINE CULTURE  LACTIC ACID, PLASMA  URINALYSIS, ROUTINE W REFLEX MICROSCOPIC  LACTIC ACID, PLASMA  COMPREHENSIVE METABOLIC PANEL  SEDIMENTATION RATE  C-REACTIVE PROTEIN    EKG EKG Interpretation  Date/Time:  Friday October 01 2018 18:28:38 EST Ventricular Rate:  107 PR Interval:    QRS Duration: 89 QT Interval:  315 QTC Calculation: 423 R Axis:   136 Text Interpretation:  Atrial fibrillation Right axis deviation Low voltage, precordial leads Baseline wander in lead(s) V4 since last tracing no significant change Confirmed by Malvin Johns [phone]) on 10/01/2018 6:42:10 PM   Radiology Dg Chest 1 View  Result Date: 10/01/2018 CLINICAL DATA:  Follow-up pneumothorax. EXAM: CHEST  1 VIEW COMPARISON:  09/09/2018  FINDINGS: Mild bilateral interstitial thickening. Small bilateral pleural effusions. Left lateral pleural thickening with a subtle lucency which may reflect a small loculated pneumothorax versus artifact. Right infrahilar hazy airspace disease likely reflecting atelectasis. Stable cardiomediastinal silhouette. No aggressive osseous lesion. Mild osteoarthritis of bilateral glenohumeral joints. IMPRESSION: Left lateral pleural thickening with a subtle lucency which may reflect a small loculated pneumothorax versus artifact. Recommend follow-up chest x-ray for re-evaluation. Bilateral interstitial thickening and small bilateral pleural effusions concerning for mild pulmonary edema. Electronically Signed   By: Kathreen Devoid   On: 10/01/2018 15:38   Ct Hip Left W Contrast  Result Date: 10/01/2018 CLINICAL DATA:  Left hip pain EXAM: CT OF THE LOWER LEFT EXTREMITY WITH CONTRAST TECHNIQUE: Multidetector CT imaging of the lower left extremity was performed according to the standard protocol following intravenous contrast administration. COMPARISON:  09/03/2018 CONTRAST:  130mL OMNIPAQUE IOHEXOL 300 MG/ML  SOLN FINDINGS: Bones/Joint/Cartilage An uncemented left total hip arthroplasty is identified with the tip traversing a left femoral diaphyseal fracture that is incompletely healed. Subtle residual fracture lucencies are suggested on the coronal reformats, series 6/46 through 48 for instance. There is callus formation developing about the fracture, series 3/143. Slight residual varus configuration about the fracture is seen  of the femur. No new/acute appearing fracture or bone destruction is seen. With reference to the hip joint, no significant joint effusion or abnormal lucencies to suggest loosening. The prosthetic femoral head is seated within the acetabular component without evidence of asymmetric lining wear. No soft tissue mass or mineralization. No hematoma or abnormal fluid collections. Osteoarthritis of the  included SI joints and pubic symphysis. No diastasis. Ligaments Suboptimally assessed by CT. Muscles and Tendons Negative for muscle edema, hematoma or definite tear. Soft tissues No soft tissue mass, mineralization or fluid collections. Postop scarring along the lateral aspect of the hip. IMPRESSION: 1. An uncemented left total hip arthroplasty is identified with the tip traversing a left femoral diaphyseal fracture that is incompletely healed. Subtle residual fracture lucencies are suggested on the coronal reformats, series 6/46 through 48. Slight residual varus configuration of the femur about the fracture is noted. 2. No new/acute appearing fracture or bone destruction is seen. 3. No significant joint effusion to suggest septic arthritis though this is of clinical concern, percutaneous sampling of the joint fluid could be attempted. No definite evidence of hardware loosening however. Electronically Signed   By: Ashley Royalty M.D.   On: 10/01/2018 20:08   Dg Chest Port 1 View  Result Date: 10/01/2018 CLINICAL DATA:  LEFT hip replacement 6 months ago, sepsis, question LEFT leg/hip infection EXAM: PORTABLE CHEST 1 VIEW COMPARISON:  Portable exam 1756 hours compared to 10/01/2018 FINDINGS: RIGHT costophrenic angle excluded. Enlargement of cardiac silhouette. Mediastinal contours and pulmonary vascularity normal. Atherosclerotic calcification aorta. Small LEFT pleural effusion and basilar atelectasis at lower LEFT chest question partially loculated. No definite infiltrate or pneumothorax. Bones demineralized. IMPRESSION: LEFT pleural effusion and basilar atelectasis, question partially locule at the lower lateral LEFT chest. Electronically Signed   By: Lavonia Dana M.D.   On: 10/01/2018 18:05    Procedures Procedures (including critical care time)  Medications Ordered in ED Medications  metroNIDAZOLE (FLAGYL) IVPB 500 mg (500 mg Intravenous New Bag/Given 10/01/18 1820)  vancomycin (VANCOCIN) 2,500 mg in  sodium chloride 0.9 % 500 mL IVPB (2,500 mg Intravenous New Bag/Given 10/01/18 1906)  vancomycin (VANCOCIN) 2,000 mg in sodium chloride 0.9 % 500 mL IVPB (has no administration in time range)  ceFEPIme (MAXIPIME) 2 g in sodium chloride 0.9 % 100 mL IVPB (has no administration in time range)  fentaNYL (SUBLIMAZE) injection 100 mcg (has no administration in time range)  sodium zirconium cyclosilicate (LOKELMA) packet 10 g (has no administration in time range)  ceFEPIme (MAXIPIME) 2 g in sodium chloride 0.9 % 100 mL IVPB (0 g Intravenous Stopped 10/01/18 1903)  fentaNYL (SUBLIMAZE) injection 50 mcg (50 mcg Intravenous Given 10/01/18 1825)  ondansetron (ZOFRAN) injection 4 mg (4 mg Intravenous Given 10/01/18 1825)  iohexol (OMNIPAQUE) 300 MG/ML solution 100 mL (100 mLs Intravenous Contrast Given 10/01/18 1942)     Initial Impression / Assessment and Plan / ED Course  I have reviewed the triage vital signs and the nursing notes.  Pertinent labs & imaging results that were available during my care of the patient were reviewed by me and considered in my medical decision making (see chart for details).     BP 118/66   Pulse (!) 107   Temp 98.8 F (37.1 C) (Oral)   Resp 20   Ht 5\' 8"  (1.727 m)   Wt 135.2 kg   SpO2 100%   BMI 45.31 kg/m    Final Clinical Impressions(s) / ED Diagnoses   Final diagnoses:  Cellulitis  of left hip  AKI (acute kidney injury) ([place])  Hyperkalemia    ED Discharge Orders    None     5:59 PM Pt with prior hx of infected prosthesis L hip here with pain and redness to the skin of L hip concerning for cellulitis and potential underlying infection.  Pt is tachycardic and hypotensive concerning for sepsis. Code sepsis initiated, broad spectrum abx started.    8:17 PM Mildly elevated WBC of 11.6 however normal lactic acid and patient does not have fever therefore code sepsis was canceled.  Labs remarkable for elevated potassium of 6.2, mild AKI with creatinine 1.5,  BUN 50.  Urine without signs of urinary tract infection.  Chest x-ray shows left pleural effusion and basilar atelectasis with question partially loculated at the lower lateral left chest.  CT of left hip demonstrate no new acute appearing fracture or bone destruction and no significant joint effusion to suggest septic arthritis.  Since patient has signs of cellulitis involving his skin I suspect his infection is more likely to be cellulitis and less likely to be septic arthritis.  He is currently receiving broad-spectrum antibiotic.  I discussed the finding with Dr. Tamera Punt.  Plan to repeat CMP to recheck patient's potassium level now that he has received some IV fluid.  Anticipate admission for further management.  9:00 PM Appreciate consultation from [facility] hospitalist, Dr. Myna Hidalgo, who will admit patient for AKI, hyperkalemia and cellulitis of the left hip.   Domenic Moras, PA-C 10/01/18 2102    Malvin Johns, MD 10/01/18 2238

## 2018-10-01 NOTE — ED Notes (Signed)
-   CPAP at night °

## 2018-10-01 NOTE — H&P (Signed)
History and Physical    [name]:[id number] DOB: 14-Feb-1946 DOA: 10/01/2018  PCP: [name], MD   Patient coming from: Home   Chief Complaint: Left hip red and tender   HPI: [name] is a 73 y.o. 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  Patient has had frequent hospitalizations recently related to pleural effusions and left hip infection.  He has become increasingly debilitated, but was unable to afford SNF and insurance was not covering.  He has noted redness and tenderness overlying the left hip at the lateral aspect without fevers, chills, or drainage.  He went to the pulmonology clinic today to follow-up on his recurrent effusions, was complaining about his left hip, and was directed to the ED for evaluation of this. He is upset that ED personnel have not brought him a meal yet and is difficult to redirect.    ED Course: Upon arrival to the ED, patient is found to be afebrile, saturating upper 70s on room air, slightly tachypneic, tachycardic in the low 100s, and with blood pressure 97/49.  EKG features atrial fibrillation with rate 107 and RAD.  Chest x-ray is notable for small left pleural effusion with basilar atelectasis and question of partial locule at the lower left chest.  Chemistry panel is notable for sodium of 133, potassium 6.2, BUN 50, and creatinine 1.50, up from 1.05 earlier this month.  CBC is notable for leukocytosis to 11,600 and lactic acid is normal.  Urinalysis unremarkable.  CT of the left hip reveals a uncemented left total hip arthroplasty traversing left femoral diaphyseal fracture that is incompletely healed, without any new or acute significant  joint effusion to suggest a septic arthritis, and no definite evidence of hardware loosening.  Blood and urine cultures were collected, 100 mcg fentanyl was administered, and the patient was treated with Lokelma, vancomycin, cefepime, and Flagyl in the ED.  Blood pressure is improved, heart rate remains slightly elevated, there is no apparent respiratory distress, and the patient will be observed for further evaluation and management.  Review of Systems:  All other systems reviewed and apart from HPI, are negative.  Past Medical History:  Diagnosis Date  . Anginal pain ([place]) 2006   evaluated by cardio  . Arthritis    knees,feet,shoulders,elbows.hands  . Constipation   . Dyspnea    with exertion   . Dysrhythmia    Atrial Flutter- 2006- corredted itself  . Family history of adverse reaction to anesthesia    mother- with novocaine went into shock  . GERD (gastroesophageal reflux disease)   . Headache   . Hemorrhoids   . History of blood transfusion   . Hypertension   . Insomnia   . Sleep apnea    cpap  . 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 APPENDECTOMY    . ARTERY BIOPSY Left 12/30/2017   Procedure: BIOPSY TEMPORAL ARTERY;  Surgeon: Fanny Skates, MD;  Location: [facility];  Service: General;  Laterality: Left;  . CHOLECYSTECTOMY    . COLONOSCOPY W/ POLYPECTOMY    . ESOPHAGOGASTRODUODENOSCOPY (EGD) WITH PROPOFOL  07/06/2012   Procedure: ESOPHAGOGASTRODUODENOSCOPY (EGD) WITH PROPOFOL;  Surgeon: Jeryl Columbia, MD;  Location:  [facility];  Service: Endoscopy;  Laterality: N/A;  . ESOPHAGOSCOPY    . EYE SURGERY     left eye- muscle repair  . HERNIA REPAIR     ventral hernia  . INCISION AND DRAINAGE HIP Left 03/17/2018   Procedure: IRRIGATION AND DEBRIDEMENT LEFT THIGH WOUND;  Surgeon: Gaynelle Arabian, MD;  Location: [facility];  Service: Orthopedics;  Laterality: Left;  . JOINT REPLACEMENT     bilateral hips.  Right broke  and had to be re placed again  . MASS EXCISION N/A 03/02/2015   Procedure: EXCISION ABDOMINAL WALL MASS;  Surgeon: Alphonsa Overall, MD;  Location: [place];  Service: General;  Laterality: N/A;  . TOTAL HIP REVISION Left 02/17/2018   Procedure: Left total hip arthroplasty revision;  Surgeon: Gaynelle Arabian, MD;  Location: [facility];  Service: Orthopedics;  Laterality: Left;  Marland Kitchen VERTICAL BANDED GASTROPLASTY       reports that he quit smoking about 34 years ago. His smoking use included cigarettes. He started smoking about 52 years ago. He has a 20.00 pack-year smoking history. He has never used smokeless tobacco. He reports current alcohol use. He reports that he does not use drugs.  Allergies  Allergen Reactions  . Bee Venom Swelling  . Nickel Rash    Family History  Problem Relation Age of Onset  . Cancer Mother   . Alcohol abuse Father      Prior to Admission medications   Medication Sig Start Date End Date Taking? Authorizing Provider  acetaminophen (TYLENOL) 325 MG tablet Take 2 tablets (650 mg total) by mouth every 6 (six) hours as needed for mild pain, fever or headache. Patient not taking: Reported on 10/01/2018 04/27/18   Barton Dubois, MD  albuterol (PROVENTIL HFA;VENTOLIN HFA) 108 [phone] Base) MCG/ACT inhaler Inhale 1-2 puffs into the lungs every 6 (six) hours as needed for wheezing or shortness of breath. 09/02/18   Hongalgi, Lenis Dickinson, MD  apixaban (ELIQUIS) 5 MG TABS tablet Take 1 tablet (5 mg total) by mouth 2 (two) times daily. 09/02/18   Hongalgi, Lenis Dickinson, MD  calcium carbonate (TUMS - DOSED IN MG ELEMENTAL CALCIUM) 500 MG chewable tablet Chew 1,000 mg by mouth 3 (three) times daily as needed for indigestion.    [provider]  carboxymethylcellulose (REFRESH TEARS) 0.5 % SOLN Place 1 drop into both eyes daily as needed (dry eye/irritation). 09/02/18   Hongalgi, Lenis Dickinson, MD  Cholecalciferol (VITAMIN D3) 2000 units TABS Take 2,000 Units by mouth every evening.     [provider]  cycloSPORINE (RESTASIS) 0.05 % ophthalmic emulsion Place 1 drop into both eyes 2 (two) times daily. 09/02/18   Hongalgi, Lenis Dickinson, MD  diltiazem (CARDIZEM CD) 180 MG 24 hr capsule Take 1 capsule (180 mg total) by mouth daily. 09/03/18   Hongalgi, Lenis Dickinson, MD  escitalopram (LEXAPRO) 10 MG tablet Take 1 tablet (10 mg total) by mouth daily. 09/02/18   Hongalgi, Lenis Dickinson, MD  ferrous sulfate 325 (65 FE) MG tablet Take 1 tablet (325 mg total) by mouth 3 (three) times daily with meals. 09/02/18   Hongalgi, Lenis Dickinson, MD  furosemide (LASIX) 20 MG tablet Take 3 tablets (60 mg total) by mouth daily. 09/11/18   Bonnielee Haff, MD  gabapentin (NEURONTIN) 100 MG capsule Take 1 capsule (100 mg total) by mouth 3 (three) times daily. 09/02/18   Hongalgi, Lenis Dickinson, MD  hydrOXYzine (ATARAX/VISTARIL) 25 MG tablet Take 1 tablet (25 mg total) by mouth 3 (three)  times daily as needed for itching or anxiety. 09/11/18   Bonnielee Haff, MD  lidocaine (LIDODERM) 5 % Place 1 patch onto the skin daily. Remove & Discard patch within 12 hours or as directed by MD 09/11/18   Bonnielee Haff, MD  Melatonin 3 MG TABS Take 2 tablets (6 mg total) by mouth at bedtime. 09/11/18   Bonnielee Haff, MD  metoprolol tartrate (LOPRESSOR) 25 MG tablet Take 1 tablet (25 mg total) by mouth 2 (two) times daily. 09/02/18   Hongalgi, Lenis Dickinson, MD  metroNIDAZOLE (FLAGYL) 500 MG tablet Take 1 tablet (500 mg total) by mouth 2 (two) times daily. 09/11/18   Bonnielee Haff, MD  Multiple Vitamins-Minerals (CENTRUM ADULTS PO) Take 1 tablet by mouth every evening.     [provider]  oxyCODONE (OXY IR/ROXICODONE) 5 MG immediate release tablet Take 1 tablet (5 mg total) by mouth every 6 (six) hours as needed for moderate pain, severe pain or breakthrough pain. 09/11/18   Bonnielee Haff, MD  pantoprazole (PROTONIX) 40 MG tablet Take 1 tablet (40 mg total) by mouth daily. 09/02/18   Hongalgi, Lenis Dickinson, MD  polyethylene glycol (MIRALAX / GLYCOLAX) packet Take 17 g by  mouth daily as needed for mild constipation. 09/02/18   Hongalgi, Lenis Dickinson, MD  potassium chloride SA (K-DUR,KLOR-CON) 20 MEQ tablet Take 1 tablet (20 mEq total) by mouth daily. 09/03/18   Hongalgi, Lenis Dickinson, MD  rOPINIRole (REQUIP) 0.25 MG tablet Take 1 tablet (0.25 mg total) by mouth at bedtime. 09/02/18   Hongalgi, Lenis Dickinson, MD  senna-docusate (SENOKOT-S) 8.6-50 MG tablet Take 2 tablets by mouth 2 (two) times daily. Patient not taking: Reported on 10/01/2018 09/02/18   Modena Jansky, MD  spironolactone (ALDACTONE) 50 MG tablet Take 100mg  every morning and 50mg  every evening 09/11/18   Bonnielee Haff, MD  thiamine 100 MG tablet Take 100 mg by mouth daily. 07/15/18   [provider]    Physical Exam: Vitals:   10/01/18 1830 10/01/18 1845 10/01/18 1900 10/01/18 1915  BP: (!) 97/49 118/66    Pulse: (!) 104 (!) 107 (!) 110 (!) 107  Resp: 18 17 (!) 23 20  Temp:      TempSrc:      SpO2: (!) 76% 100% 100% 100%  Weight:      Height:        Constitutional: NAD, calm, disheveled, obese Eyes: PERTLA, lids and conjunctivae normal ENMT: Mucous membranes are moist. Posterior pharynx clear of any exudate or lesions.   Neck: normal, supple, no masses, no thyromegaly Respiratory: clear to auscultation, no wheezing, no crackles. Normal respiratory effort.    Cardiovascular: Rate ~110 and regular. Mild pretibial edema bilaterally. Abdomen: No distension, no tenderness, soft. Bowel sounds active.  Musculoskeletal: no clubbing / cyanosis. Left hip tenderness, ROM intact.    Skin: Erythema, heat, and tenderness over posterolateral left hip without drainage or fluctuance. Warm, dry, well-perfused. Neurologic: No facial asymmetry. Sensation intact. Moving all extremities.  Psychiatric: Alert and oriented to person, place, and situation. Upset about not receiving meal yet in ED, difficult to redirect but ultimately cooperates with exam.     Labs on Admission: I have personally reviewed following labs  and imaging studies  CBC: Recent Labs  Lab 10/01/18 1835  WBC 11.6*  NEUTROABS 9.4*  HGB 13.1  HCT 42.9  MCV 87.9  PLT 701   Basic Metabolic Panel: Recent Labs  Lab 10/01/18 1835  NA 133*  K 6.2*  CL 99  CO2 23  GLUCOSE 103*  BUN 50*  CREATININE 1.50*  CALCIUM 9.9   GFR: Estimated Creatinine Clearance: 59.9 mL/min (A) (by C-G formula based on SCr of 1.5 mg/dL (H)). Liver Function Tests: Recent Labs  Lab 10/01/18 1835  AST 19  ALT 11  ALKPHOS 56  BILITOT 0.5  PROT 7.4  ALBUMIN 3.1*   No results for input(s): LIPASE, AMYLASE in the last 168 hours. No results for input(s): AMMONIA in the last 168 hours. Coagulation Profile: No results for input(s): INR, PROTIME in the last 168 hours. Cardiac Enzymes: No results for input(s): CKTOTAL, CKMB, CKMBINDEX, TROPONINI in the last 168 hours. BNP (last 3 results) No results for input(s): PROBNP in the last 8760 hours. HbA1C: No results for input(s): HGBA1C in the last 72 hours. CBG: No results for input(s): GLUCAP in the last 168 hours. Lipid Profile: No results for input(s): CHOL, HDL, LDLCALC, TRIG, CHOLHDL, LDLDIRECT in the last 72 hours. Thyroid Function Tests: No results for input(s): TSH, T4TOTAL, FREET4, T3FREE, THYROIDAB in the last 72 hours. Anemia Panel: No results for input(s): VITAMINB12, FOLATE, FERRITIN, TIBC, IRON, RETICCTPCT in the last 72 hours. Urine analysis:    Component Value Date/Time   COLORURINE YELLOW 10/01/2018 1822   APPEARANCEUR CLEAR 10/01/2018 1822   APPEARANCEUR Clear 01/18/2018 1539   LABSPEC 1.010 10/01/2018 1822   PHURINE 5.0 10/01/2018 1822   GLUCOSEU NEGATIVE 10/01/2018 1822   HGBUR NEGATIVE 10/01/2018 1822   BILIRUBINUR NEGATIVE 10/01/2018 1822   BILIRUBINUR Negative 01/18/2018 1539   KETONESUR NEGATIVE 10/01/2018 1822   PROTEINUR NEGATIVE 10/01/2018 1822   UROBILINOGEN 0.2 09/06/2012 0213   NITRITE NEGATIVE 10/01/2018 1822   LEUKOCYTESUR NEGATIVE 10/01/2018 1822    LEUKOCYTESUR Negative 01/18/2018 1539   Sepsis Labs: @LABRCNTIP (procalcitonin:4,lacticidven:4) )No results found for this or any previous visit (from the past 240 hour(s)).   Radiological Exams on Admission: Dg Chest 1 View  Result Date: 10/01/2018 CLINICAL DATA:  Follow-up pneumothorax. EXAM: CHEST  1 VIEW COMPARISON:  09/09/2018 FINDINGS: Mild bilateral interstitial thickening. Small bilateral pleural effusions. Left lateral pleural thickening with a subtle lucency which may reflect a small loculated pneumothorax versus artifact. Right infrahilar hazy airspace disease likely reflecting atelectasis. Stable cardiomediastinal silhouette. No aggressive osseous lesion. Mild osteoarthritis of bilateral glenohumeral joints. IMPRESSION: Left lateral pleural thickening with a subtle lucency which may reflect a small loculated pneumothorax versus artifact. Recommend follow-up chest x-ray for re-evaluation. Bilateral interstitial thickening and small bilateral pleural effusions concerning for mild pulmonary edema. Electronically Signed   By: Kathreen Devoid   On: 10/01/2018 15:38   Ct Hip Left W Contrast  Result Date: 10/01/2018 CLINICAL DATA:  Left hip pain EXAM: CT OF THE LOWER LEFT EXTREMITY WITH CONTRAST TECHNIQUE: Multidetector CT imaging of the lower left extremity was performed according to the standard protocol following intravenous contrast administration. COMPARISON:  09/03/2018 CONTRAST:  158mL OMNIPAQUE IOHEXOL 300 MG/ML  SOLN FINDINGS: Bones/Joint/Cartilage An uncemented left total hip arthroplasty is identified with the tip traversing a left femoral diaphyseal fracture that is incompletely healed. Subtle residual fracture lucencies are suggested on the coronal reformats, series 6/46 through 48 for instance. There is callus formation developing about the fracture, series 3/143. Slight residual varus configuration about the fracture is seen of the femur. No new/acute appearing fracture or bone  destruction is seen. With reference to the hip joint, no significant joint effusion or abnormal lucencies to suggest loosening. The prosthetic femoral head is seated within the acetabular component without evidence of asymmetric lining  wear. No soft tissue mass or mineralization. No hematoma or abnormal fluid collections. Osteoarthritis of the included SI joints and pubic symphysis. No diastasis. Ligaments Suboptimally assessed by CT. Muscles and Tendons Negative for muscle edema, hematoma or definite tear. Soft tissues No soft tissue mass, mineralization or fluid collections. Postop scarring along the lateral aspect of the hip. IMPRESSION: 1. An uncemented left total hip arthroplasty is identified with the tip traversing a left femoral diaphyseal fracture that is incompletely healed. Subtle residual fracture lucencies are suggested on the coronal reformats, series 6/46 through 48. Slight residual varus configuration of the femur about the fracture is noted. 2. No new/acute appearing fracture or bone destruction is seen. 3. No significant joint effusion to suggest septic arthritis though this is of clinical concern, percutaneous sampling of the joint fluid could be attempted. No definite evidence of hardware loosening however. Electronically Signed   By: Ashley Royalty M.D.   On: 10/01/2018 20:08   Dg Chest Port 1 View  Result Date: 10/01/2018 CLINICAL DATA:  LEFT hip replacement 6 months ago, sepsis, question LEFT leg/hip infection EXAM: PORTABLE CHEST 1 VIEW COMPARISON:  Portable exam 1756 hours compared to 10/01/2018 FINDINGS: RIGHT costophrenic angle excluded. Enlargement of cardiac silhouette. Mediastinal contours and pulmonary vascularity normal. Atherosclerotic calcification aorta. Small LEFT pleural effusion and basilar atelectasis at lower LEFT chest question partially loculated. No definite infiltrate or pneumothorax. Bones demineralized. IMPRESSION: LEFT pleural effusion and basilar atelectasis, question  partially locule at the lower lateral LEFT chest. Electronically Signed   By: Lavonia Dana M.D.   On: 10/01/2018 18:05    EKG: Independently reviewed. Atrial fibrillation, rate 107, RAD.   Assessment/Plan  1. Acute kidney injury; hyperkalemia  - Potassium is 6.2 in setting of AKI and potassium supplements  - Creatinine is 1.50, up from 1.05 earlier this month  - No significant EKG changes  - Treated with Lokelma in ED  - Continue cardiac monitoring, repeat potassium level, hold supplemental potassium, hydrate gently, renally-dose medications, and repeat chem panel in am    2. Cellulitis of left hip; hx of left THA with infection  - Patient underwent left THA in June that was complicated by infection  - He completed a course of ertapenem and has been on Flagyl with planned end-date November 19, 2018  - He reports acute erythema and tenderness overlying the left lateral hip   - There is mild leukocytosis, no fever, and normal lactate  - No evidence for fluid-collection or involvement of prosthesis or bone on CT, and no effusion to suggest septic arthritis  - Blood cultures were collected in ED and he was treated with vancomycin, cefepime, and Flagyl in ED  - Given the underlying prosthesis, will continue broad coverage empirically for now while following cultures and inflammatory markers, and clinical course   3. Paroxysmal atrial fibrillation  - In a fib with rates low 100's in ED  - CHADS-VASc at least 2 (age x2)  - Continue Eliquis, continue diltiazem and metoprolol as tolerated    4. Cirrhosis  - Uncertain etiology  - Appears compensated  - Diuretics held on admission in light of increased creatinine    5. Chronic back and right shoulder pain  - Continue home regimen with lidocaine patch, and APAP and oxycodone as-needed    6. Recurrent pleural effusion  - Patient has had thoracenteses during recent admission, was seen by pulm in hospital, fluid studies suggest transudate, had  preserved EF on echo, possible related to cirrhosis  -  He went to pulm clinic for follow-up on 1/31 but complained of left hip infection and was sent to ED  - Small left pleural effusion noted on admission CXR with possible small locule  - He does not appear dyspneic, is speaking full sentences, had sat in mid-70's documented in ED but currently 100% on 2 Lpm, will try to wean   - He will reschedule pulm follow-up   7. Physical debility  - Patient is chronically ill with frequent hospitalizations and deconditioning  - PT consulted for eval and tx     DVT prophylaxis: Eliquis  Code Status: Full  Family Communication: Discussed with patient Consults called: none Admission status: Observation     Vianne Bulls, MD [facility] Pager [phone]  If 7PM-7AM, please contact night-coverage [email or web address] Password [facility]  10/01/2018, 9:23 PM

## 2018-10-02 DIAGNOSIS — E875 Hyperkalemia: Secondary | ICD-10-CM | POA: Diagnosis not present

## 2018-10-02 DIAGNOSIS — L03116 Cellulitis of left lower limb: Secondary | ICD-10-CM | POA: Diagnosis not present

## 2018-10-02 DIAGNOSIS — N179 Acute kidney failure, unspecified: Secondary | ICD-10-CM | POA: Diagnosis not present

## 2018-10-02 DIAGNOSIS — M549 Dorsalgia, unspecified: Secondary | ICD-10-CM | POA: Diagnosis not present

## 2018-10-02 LAB — CBC WITH DIFFERENTIAL/PLATELET
Abs Immature Granulocytes: 0.03 10*3/uL (ref 0.00–0.07)
Basophils Absolute: 0 10*3/uL (ref 0.0–0.1)
Basophils Relative: 0 %
Eosinophils Absolute: 0 10*3/uL (ref 0.0–0.5)
Eosinophils Relative: 0 %
HCT: 35.5 % — ABNORMAL LOW (ref 39.0–52.0)
Hemoglobin: 11.3 g/dL — ABNORMAL LOW (ref 13.0–17.0)
Immature Granulocytes: 0 %
Lymphocytes Relative: 15 %
Lymphs Abs: 1.4 10*3/uL (ref 0.7–4.0)
MCH: 27.2 pg (ref 26.0–34.0)
MCHC: 31.8 g/dL (ref 30.0–36.0)
MCV: 85.3 fL (ref 80.0–100.0)
Monocytes Absolute: 1.3 10*3/uL — ABNORMAL HIGH (ref 0.1–1.0)
Monocytes Relative: 14 %
Neutro Abs: 6.4 10*3/uL (ref 1.7–7.7)
Neutrophils Relative %: 71 %
Platelets: 228 10*3/uL (ref 150–400)
RBC: 4.16 MIL/uL — ABNORMAL LOW (ref 4.22–5.81)
RDW: 17.4 % — ABNORMAL HIGH (ref 11.5–15.5)
WBC: 9.1 10*3/uL (ref 4.0–10.5)
nRBC: 0 % (ref 0.0–0.2)

## 2018-10-02 LAB — BASIC METABOLIC PANEL
Anion gap: 8 (ref 5–15)
BUN: 44 mg/dL — ABNORMAL HIGH (ref 8–23)
CO2: 20 mmol/L — ABNORMAL LOW (ref 22–32)
Calcium: 9.5 mg/dL (ref 8.9–10.3)
Chloride: 104 mmol/L (ref 98–111)
Creatinine, Ser: 1.29 mg/dL — ABNORMAL HIGH (ref 0.61–1.24)
GFR calc Af Amer: >60 mL/min (ref >60)
GFR calc non Af Amer: 55 mL/min — ABNORMAL LOW (ref >60)
Glucose, Bld: 104 mg/dL — ABNORMAL HIGH (ref 70–99)
Potassium: 4.6 mmol/L (ref 3.5–5.1)
Sodium: 132 mmol/L — ABNORMAL LOW (ref 135–145)

## 2018-10-02 LAB — POTASSIUM
Potassium: 5 mmol/L (ref 3.5–5.1)
Potassium: 4.8 mmol/L (ref 3.5–5.1)
Potassium: 5.1 mmol/L (ref 3.5–5.1)
Potassium: 5 mmol/L (ref 3.5–5.1)
Potassium: 5.3 mmol/L — ABNORMAL HIGH (ref 3.5–5.1)

## 2018-10-02 LAB — SODIUM, URINE, RANDOM: Sodium, Ur: 54 mmol/L

## 2018-10-02 LAB — CREATININE, URINE, RANDOM: Creatinine, Urine: 37.49 mg/dL

## 2018-10-02 MED ORDER — ALPRAZOLAM 0.25 MG PO TABS
0.2500 mg | ORAL_TABLET | Freq: Once | ORAL | Status: AC
  Administered 2018-10-02: 0.25 mg via ORAL
  Filled 2018-10-02: qty 1

## 2018-10-02 MED ORDER — HALOPERIDOL LACTATE 5 MG/ML IJ SOLN
2.0000 mg | Freq: Once | INTRAMUSCULAR | Status: AC
  Administered 2018-10-02: 2 mg via INTRAVENOUS
  Filled 2018-10-02: qty 1

## 2018-10-02 MED ORDER — ENSURE MAX PROTEIN PO LIQD
11.0000 [oz_av] | Freq: Two times a day (BID) | ORAL | Status: DC
  Administered 2018-10-02 – 2018-10-19 (×31): 11 [oz_av] via ORAL
  Filled 2018-10-02 (×41): qty 330

## 2018-10-02 MED ORDER — ADULT MULTIVITAMIN W/MINERALS CH
1.0000 | ORAL_TABLET | Freq: Every day | ORAL | Status: DC
  Administered 2018-10-02 – 2018-12-07 (×67): 1 via ORAL
  Filled 2018-10-02 (×67): qty 1

## 2018-10-02 NOTE — Progress Notes (Signed)
Rt note: patient placed on home cpap safety check and set up done no issues

## 2018-10-02 NOTE — Evaluation (Signed)
Physical Therapy Evaluation Patient Details Name: [name] MRN: [id number] DOB: [birth year] Today's Date: 10/02/2018   History of Present Illness  Pt is a 73 year old male with PMHx significant for HTN, afib on Eliquis, OSA, morbid obesity, L THA and hip infection and history of multiple recent prolonged hospitalizations.  Pt presented to ED with generalized weakness, falls, and shortness of breath- work-up in the emergency room showed A. fib heart rate 108 and a recurrent right-sided pleural effusion  Clinical Impression  Pt was assessed and was noted to be much more dependent for care than his previous admission.  Pt has been off his feet awhile before admission from home, where he had stopped using RW and taking steps just recently.  He is expected to need SNF care and is especially concerning since he reports using SNF for three recent visits.  Will refer this to SW and case management to see how to pursue follow up care.  PT see acutely for transfers, balance and LE strengthening.    Follow Up Recommendations SNF    Equipment Recommendations  None recommended by PT    Recommendations for Other Services       Precautions / Restrictions Precautions Precautions: Fall(telemetry, L hip THA with incomplete healing) Restrictions Weight Bearing Restrictions: No      Mobility  Bed Mobility Overal bed mobility: Needs Assistance Bed Mobility: Supine to Sit;Sit to Supine     Supine to sit: Max assist Sit to supine: Total assist   General bed mobility comments: assisted to support back and help him scoot to side of bed  Transfers Overall transfer level: Needs assistance               General transfer comment: pt refused to attempt standing stating he is not able, did not elaborate  Ambulation/Gait             General Gait Details: declined by pt  Stairs            Wheelchair Mobility    Modified Rankin (Stroke Patients Only)       Balance Overall  balance assessment: Needs assistance Sitting-balance support: Feet supported;Bilateral upper extremity supported Sitting balance-[name]: Poor                                       Pertinent Vitals/Pain Pain Assessment: Faces Faces Pain Scale: Hurts little more Pain Location: back due to stiffness from flexing trunk Pain Descriptors / Indicators: Grimacing Pain Intervention(s): Monitored during session;Repositioned    Home Living Family/patient expects to be discharged to:: Skilled nursing facility Living Arrangements: Alone Available Help at Discharge: Available PRN/intermittently;Family Type of Home: House Home Access: Ramped entrance     Home Layout: One level Home Equipment: [place] - 2 wheels;Wheelchair - [employer];Hospital bed      Prior Function Level of Independence: Needs assistance   Gait / Transfers Assistance Needed: has not been ambulatory in a few weeks per pt  ADL's / Homemaking Assistance Needed: was able to stand up and take steps on RW to assist his care at home prior to hip infection        Hand Dominance   Dominant Hand: Right    Extremity/Trunk Assessment   Upper Extremity Assessment Upper Extremity Assessment: Generalized weakness    Lower Extremity Assessment Lower Extremity Assessment: Generalized weakness    Cervical / Trunk Assessment Cervical /  Trunk Assessment: Other exceptions(chronic back pain)  Communication   Communication: No difficulties  Cognition Arousal/Alertness: Awake/alert Behavior During Therapy: WFL for tasks assessed/performed Overall Cognitive Status: Within Functional Limits for tasks assessed                                 General Comments: pt seems aware of his situation but is reluctant to attempt any off the bed mobility      General Comments General comments (skin integrity, edema, etc.): pt held onto his knees to support sitting effort, stated he could not  stay up but is very minimally losing balance backward if he lets go    Exercises     Assessment/Plan    PT Assessment Patient needs continued PT services  PT Problem List Decreased strength;Decreased range of motion;Decreased activity tolerance;Decreased balance;Decreased mobility;Decreased coordination;Decreased knowledge of use of DME;Decreased safety awareness;Cardiopulmonary status limiting activity;Obesity;Decreased skin integrity;Pain       PT Treatment Interventions DME instruction;Gait training;Functional mobility training;Therapeutic activities;Therapeutic exercise;Neuromuscular re-education;Balance training;Patient/family education    PT Goals (Current goals can be found in the Care Plan section)  Acute Rehab PT Goals Patient Stated Goal: to get stronger and get home PT Goal Formulation: With patient Time For Goal Achievement: 10/16/18 Potential to Achieve Goals: Good    Frequency Min 2X/week   Barriers to discharge Inaccessible home environment;Decreased caregiver support home alone with a ramp in house    Co-evaluation               AM-PAC PT "6 Clicks" Mobility  Outcome Measure Help needed turning from your back to your side while in a flat bed without using bedrails?: A Lot Help needed moving from lying on your back to sitting on the side of a flat bed without using bedrails?: A Lot Help needed moving to and from a bed to a chair (including a wheelchair)?: A Lot Help needed standing up from a chair using your arms (e.g., wheelchair or bedside chair)?: Total Help needed to walk in hospital room?: Total Help needed climbing 3-5 steps with a railing? : Total 6 Click Score: 9    End of Session   Activity Tolerance: Patient limited by fatigue;Other (comment)(mm weakness) Patient left: in bed;with call bell/phone within reach;with bed alarm set Nurse Communication: Mobility status PT Visit Diagnosis: Other abnormalities of gait and mobility (R26.89);Muscle  weakness (generalized) (M62.81);Difficulty in walking, not elsewhere classified (R26.2);History of falling (Z91.81)    Time: 5003-7048 PT Time Calculation (min) (ACUTE ONLY): 35 min   Charges:   PT Evaluation $PT Eval Moderate Complexity: 1 Mod PT Treatments $Neuromuscular Re-education: 8-22 mins       Ramond Dial 10/02/2018, 12:56 PM  Mee Hives, PT MS Acute Rehab Dept. Number: [place]

## 2018-10-02 NOTE — Progress Notes (Signed)
Pt has home CPAP. RT offered to help pt apply CPAP pt states he can do so himself. RT checked H2O level for pt and offered any further assistance. RT will continue to monitor.

## 2018-10-02 NOTE — Progress Notes (Signed)
Pt arrived to [facility] around 2300 via bed. Pt alert and oriented x4, with c/o of general discomfort and feel restless. Page on call about pt discomfort. New order received. Pt received anxiety, pain med. Pt requested frequent assistance from turning his pillow, to move his leg amount others things. Pt also keep removing CPAP, c/o of discomfort with cpap on.

## 2018-10-02 NOTE — Progress Notes (Signed)
Pt systolic BP of 03-OZY 24'M. MD Ghimire aware. Pt asymptomatic. Will continue to monitor throughout shift.

## 2018-10-02 NOTE — Progress Notes (Addendum)
PROGRESS NOTE        PATIENT DETAILS Name: [name] Age: 73 y.o. Sex: male Date of Birth: 03-19-1946 Admit Date: 10/01/2018 Admitting Physician Vianne Bulls, MD EGB:[id number], Hollice Espy, MD  Brief Narrative: Patient is a 73 y.o. male with history of A. 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  Found to have a soft tissue infection of the left hip area, acute kidney injury and hyperkalemia.  Subsequently admitted to the hospitalist service.  See below for further details  Subjective: Pain in the left hip area is better-he does not have chest pain or shortness of breath.  Assessment/Plan: AKI: Likely hemodynamically mediated-secondary to soft tissue infection of the left hip, diuretic use.  Resolving with supportive care.  Hyperkalemia: Suspect secondary to AKI with use of Aldactone and potassium supplementation.  Resolved.  Left hip soft tissue infection: Has erythema overlying the left hip-however CT scan of the left hip does not show any obvious infection or effusion.  Patient is afebrile-leukocytosis is resolved-but continues to have some erythema around his left hip area (see picture below)-we will plan on continuing IV antimicrobial therapy with vancomycin and watch closely.  Blood cultures pending as of this morning.  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 Henrietta Dine however was  evaluated by orthopedics and ID and [facility] once he had left hip infection.  He is on Flagyl-with planned end date November 19, 2018.   Recurrent pleural effusion: Felt to be secondary to hepatic hydrothorax-recent chest x-ray shows a small left pleural effusion seen on x-ray.  Has had extensive work-up including multiple thoracocentesis in the past.  Will resume diuretics when renal function improves further.  Cryptogenic cirrhosis: Prior hepatitis serology done earlier this month was negative-agree with plans outlined in prior discharge summary for outpatient follow-up.  Recheck renal function tomorrow-if creatinine is still stable-suspect diuretics can be resumed.  Volume status is currently stable.  Atrial fibrillation: Rate controlled with Cardizem and metoprolol, continue Eliquis.  Chronic back and right shoulder pain: Continue Lidoderm patch, Tylenol and as needed narcotics.  Bipolar disorder: Stable-continue Lexapro, Neurontin  OSA: Continue CPAP nightly  Morbid obesity  Acute on chronic debility/deconditioning: Has chronic debility/deconditioning at baseline-this is worsened due to acute illness/AKI/left hip infection-we will reconsult physical therapy-May require SNF on discharge.  DVT Prophylaxis: Full dose anticoagulation with Eliquis  Code Status: Full code  Family Communication: None at bedside  Disposition Plan: Remain inpatient-but will plan on Home health vs SNF on discharge  Antimicrobial agents: Anti-infectives (From admission, onward)   Start     Dose/Rate Route Frequency Ordered Stop   10/03/18 0700  vancomycin (VANCOCIN) 2,000 mg in sodium chloride 0.9 % 500 mL IVPB     2,000 mg 250 mL/hr over 120 Minutes Intravenous Every 36 hours 10/01/18 1942     10/02/18 0200  ceFEPIme (MAXIPIME) 2 g in sodium  chloride 0.9 % 100 mL IVPB     2 g 200 mL/hr over 30 Minutes Intravenous Every 8 hours 10/01/18 1942     10/01/18 1830  vancomycin (VANCOCIN)  2,500 mg in sodium chloride 0.9 % 500 mL IVPB     2,500 mg 250 mL/hr over 120 Minutes Intravenous STAT 10/01/18 1825 10/02/18 0126   10/01/18 1800  ceFEPIme (MAXIPIME) 2 g in sodium chloride 0.9 % 100 mL IVPB     2 g 200 mL/hr over 30 Minutes Intravenous  Once 10/01/18 1747 10/01/18 1903   10/01/18 1800  metroNIDAZOLE (FLAGYL) IVPB 500 mg     500 mg 100 mL/hr over 60 Minutes Intravenous Every 8 hours 10/01/18 1747     10/01/18 1800  vancomycin (VANCOCIN) IVPB 1000 mg/200 mL premix  Status:  Discontinued     1,000 mg 200 mL/hr over 60 Minutes Intravenous  Once 10/01/18 1747 10/01/18 1825      Procedures: None  CONSULTS:  None  Time spent: 25- minutes-Greater than 50% of this time was spent in counseling, explanation of diagnosis, planning of further management, and coordination of care.  MEDICATIONS: Scheduled Meds: . apixaban  5 mg Oral BID  . cycloSPORINE  1 drop Both Eyes BID  . diltiazem  180 mg Oral Daily  . escitalopram  10 mg Oral Daily  . gabapentin  100 mg Oral TID  . lidocaine  1 patch Transdermal Q24H  . Melatonin  6 mg Oral QHS  . metoprolol tartrate  25 mg Oral BID  . pantoprazole  40 mg Oral Daily  . rOPINIRole  0.25 mg Oral QHS  . sodium chloride flush  3 mL Intravenous Q12H  . sodium zirconium cyclosilicate  10 g Oral Once  . thiamine  100 mg Oral Daily   Continuous Infusions: . ceFEPime (MAXIPIME) IV 2 g (10/02/18 0140)  . metronidazole 500 mg (10/02/18 0948)  . sodium chloride    . [START ON 10/03/2018] vancomycin     PRN Meds:.acetaminophen **OR** acetaminophen, albuterol, hydrOXYzine, ondansetron **OR** ondansetron (ZOFRAN) IV, oxyCODONE, polyethylene glycol   PHYSICAL EXAM: Vital signs: Vitals:   10/01/18 2300 10/01/18 2339 10/02/18 0053 10/02/18 0515  BP:  93/66  (!) 88/41  Pulse:  96 98 (!) 102  Resp:  18 20 16   Temp:  99 F (37.2 C)  98.4 F (36.9 C)  TempSrc:  Oral  Oral  SpO2:  96% 96% 97%  Weight:      Height: 5\' 8"  (1.727 m)       Filed Weights   10/01/18 1725  Weight: 135.2 kg   Body mass index is 45.31 kg/m.   General appearance :Awake, alert, not in any distress.  HEENT: Atraumatic and Normocephalic Neck: supple Resp:Good air entry bilaterally, no added sounds  CVS: S1 S2 regular, no murmurs.  GI: Bowel sounds present, Non tender and not distended with no gaurding, rigidity or rebound.No organomegaly. Extremities: B/L Lower Ext shows no edema, both legs are warm to touch.  See left hip picture below Neurology: Has generalized weakness-but nonfocal Psychiatric: Normal judgment and insight. Alert and oriented x 3. Normal mood. Musculoskeletal:No digital cyanosis Skin:No Rash, warm and dry Wounds:N/A      I have personally reviewed following labs and imaging studies  LABORATORY DATA: CBC: Recent Labs  Lab 10/01/18 1835 10/02/18 0112  WBC 11.6* 9.1  NEUTROABS 9.4* 6.4  HGB 13.1 11.3*  HCT 42.9 35.5*  MCV 87.9 85.3  PLT 248 228    Basic  Metabolic Panel: Recent Labs  Lab 10/01/18 1835 10/01/18 2131 10/02/18 0112 10/02/18 0524  NA 133*  --   --  132*  K 6.2* 5.7* 5.0 4.6  CL 99  --   --  104  CO2 23  --   --  20*  GLUCOSE 103*  --   --  104*  BUN 50*  --   --  44*  CREATININE 1.50*  --   --  1.29*  CALCIUM 9.9  --   --  9.5    GFR: Estimated Creatinine Clearance: 69.6 mL/min (A) (by C-G formula based on SCr of 1.29 mg/dL (H)).  Liver Function Tests: Recent Labs  Lab 10/01/18 1835  AST 19  ALT 11  ALKPHOS 56  BILITOT 0.5  PROT 7.4  ALBUMIN 3.1*   No results for input(s): LIPASE, AMYLASE in the last 168 hours. No results for input(s): AMMONIA in the last 168 hours.  Coagulation Profile: No results for input(s): INR, PROTIME in the last 168 hours.  Cardiac Enzymes: No results for input(s): CKTOTAL, CKMB, CKMBINDEX, TROPONINI in the last 168 hours.  BNP (last 3 results) No results for input(s): PROBNP in the last 8760 hours.  HbA1C: No results for input(s): HGBA1C  in the last 72 hours.  CBG: No results for input(s): GLUCAP in the last 168 hours.  Lipid Profile: No results for input(s): CHOL, HDL, LDLCALC, TRIG, CHOLHDL, LDLDIRECT in the last 72 hours.  Thyroid Function Tests: No results for input(s): TSH, T4TOTAL, FREET4, T3FREE, THYROIDAB in the last 72 hours.  Anemia Panel: No results for input(s): VITAMINB12, FOLATE, FERRITIN, TIBC, IRON, RETICCTPCT in the last 72 hours.  Urine analysis:    Component Value Date/Time   COLORURINE YELLOW 10/01/2018 1822   APPEARANCEUR CLEAR 10/01/2018 1822   APPEARANCEUR Clear 01/18/2018 1539   LABSPEC 1.010 10/01/2018 1822   PHURINE 5.0 10/01/2018 1822   GLUCOSEU NEGATIVE 10/01/2018 1822   HGBUR NEGATIVE 10/01/2018 1822   BILIRUBINUR NEGATIVE 10/01/2018 1822   BILIRUBINUR Negative 01/18/2018 1539   KETONESUR NEGATIVE 10/01/2018 1822   PROTEINUR NEGATIVE 10/01/2018 1822   UROBILINOGEN 0.2 09/06/2012 0213   NITRITE NEGATIVE 10/01/2018 1822   LEUKOCYTESUR NEGATIVE 10/01/2018 1822   LEUKOCYTESUR Negative 01/18/2018 1539    Sepsis Labs: Lactic Acid, Venous    Component Value Date/Time   LATICACIDVEN 1.7 10/01/2018 1833    MICROBIOLOGY: Recent Results (from the past 240 hour(s))  Blood Culture (routine x 2)     Status: None (Preliminary result)   Collection Time: 10/01/18  6:15 PM  Result Value Ref Range Status   Specimen Description BLOOD LEFT HAND  Final   Special Requests   Final    BOTTLES DRAWN AEROBIC AND ANAEROBIC Blood Culture adequate volume   Culture NO GROWTH < 12 HOURS  Final   Report Status PENDING  Incomplete  Blood Culture (routine x 2)     Status: None (Preliminary result)   Collection Time: 10/01/18  6:17 PM  Result Value Ref Range Status   Specimen Description BLOOD RIGHT ANTECUBITAL  Final   Special Requests   Final    BOTTLES DRAWN AEROBIC AND ANAEROBIC Blood Culture adequate volume   Culture NO GROWTH < 12 HOURS  Final   Report Status PENDING  Incomplete     RADIOLOGY STUDIES/RESULTS: Dg Chest 1 View  Result Date: 10/01/2018 CLINICAL DATA:  Follow-up pneumothorax. EXAM: CHEST  1 VIEW COMPARISON:  09/09/2018 FINDINGS: Mild bilateral interstitial thickening. Small bilateral pleural effusions. Left lateral pleural  thickening with a subtle lucency which may reflect a small loculated pneumothorax versus artifact. Right infrahilar hazy airspace disease likely reflecting atelectasis. Stable cardiomediastinal silhouette. No aggressive osseous lesion. Mild osteoarthritis of bilateral glenohumeral joints. IMPRESSION: Left lateral pleural thickening with a subtle lucency which may reflect a small loculated pneumothorax versus artifact. Recommend follow-up chest x-ray for re-evaluation. Bilateral interstitial thickening and small bilateral pleural effusions concerning for mild pulmonary edema. Electronically Signed   By: Kathreen Devoid   On: 10/01/2018 15:38   Dg Chest 2 View  Result Date: 09/09/2018 CLINICAL DATA:  BILATERAL pleural effusions, shortness of breath, weakness, history hypertension, CHF, former smoker EXAM: CHEST - 2 VIEW COMPARISON:  09/08/2018 FINDINGS: Enlargement of cardiac silhouette. Atherosclerotic calcification aorta. Mediastinal contours and pulmonary vascularity normal. Bibasilar effusions and atelectasis. Upper lungs clear. No pleural effusion or pneumothorax. Degenerative disc disease changes thoracic spine as well as BILATERAL glenohumeral degenerative changes. IMPRESSION: Enlargement of cardiac silhouette with bibasilar pleural effusions and atelectasis. Electronically Signed   By: Lavonia Dana M.D.   On: 09/09/2018 15:08   Dg Chest 2 View  Result Date: 09/08/2018 CLINICAL DATA:  Recurrent pleural effusion on the right EXAM: CHEST - 2 VIEW COMPARISON:  Three days ago FINDINGS: Unchanged small to moderate right pleural effusion that appears to be layering. Unchanged pleural thickening on the left. Chronic cardiomegaly. Stable interstitial  coarsening. IMPRESSION: 1. Unchanged small to moderate right pleural effusion. 2. Stable pleural thickening on the left. Electronically Signed   By: Monte Fantasia M.D.   On: 09/08/2018 08:38   Dg Chest 2 View  Result Date: 09/05/2018 CLINICAL DATA:  One day post thoracentesis. EXAM: CHEST - 2 VIEW COMPARISON:  09/04/2018 FINDINGS: Cardiac silhouette is mildly enlarged. There is interstitial thickening that appears increased compared to the prior. Left pleural effusion is stable. There has been increase in right pleural fluid. No pneumothorax. Skeletal structures are grossly intact. IMPRESSION: 1. Worsened lung aeration compared to the previous day's study. Findings are consistent with interstitial pulmonary edema, likely due to congestive heart failure, with pleural effusions. Right pleural effusion has increased from the previous day's exam. 2. No pneumothorax. Electronically Signed   By: Lajean Manes M.D.   On: 09/05/2018 16:01   Dg Chest 2 View  Result Date: 09/03/2018 CLINICAL DATA:  Per EMS, pt fell at home this afternoon. Pt complains of left leg knee pain, which EMS states he had before the fall. Pt has hx of chronic back pain. EXAM: CHEST - 2 VIEW COMPARISON:  08/29/2018 and older studies. FINDINGS: Cardiac silhouette is mildly enlarged. No mediastinal or hilar masses. There is bilateral lung base opacity, greater on the right. Opacity is consistent with a combination of small effusions and either atelectasis or infiltrate. Right lung base opacity appears increased from the most recent prior exam. There are prominent bronchovascular markings elsewhere. Linear opacities noted in the left mid lung peripherally consistent with scarring. These findings are stable. No pneumothorax. Skeletal structures are demineralized but grossly intact. IMPRESSION: 1. Bilateral lung base opacities, right greater than left, with the right appearing increased compared to the most recent prior chest radiograph, left  stable. Lung base opacities are consistent with a combination of pleural fluid and either atelectasis or infection. No convincing pulmonary edema. Electronically Signed   By: Lajean Manes M.D.   On: 09/03/2018 21:05   Dg Shoulder Right  Result Date: 09/05/2018 CLINICAL DATA:  Chronic right shoulder pain. EXAM: RIGHT SHOULDER - 2+ VIEW COMPARISON:  Plain films  right shoulder 05/17/2018. FINDINGS: The patient has bone-on-bone glenohumeral osteoarthritis with small subchondral cysts, sclerosis and an osteophyte off the humeral head. Loose bodies in the subscapularis recess noted. Moderate acromioclavicular osteoarthritis is seen. No acute bony abnormality. IMPRESSION: No acute finding. 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 Electronically Signed   By: Inge Rise M.D.   On: 09/05/2018 16:02   Ct Head Wo Contrast  Result Date: 09/03/2018 CLINICAL DATA:  Fall. Headache and neck pain. EXAM: CT HEAD WITHOUT CONTRAST CT CERVICAL SPINE WITHOUT CONTRAST TECHNIQUE: Multidetector CT imaging of the head and cervical spine was performed following the standard protocol without intravenous contrast. Multiplanar CT image reconstructions of the cervical spine were also generated. COMPARISON:  07/18/2018 CT head. Cervical spine radiographs from 11/12/2016 FINDINGS: CT HEAD FINDINGS Brain: The brainstem, cerebellum, cerebral peduncles, thalami, basal ganglia, basilar cisterns, and ventricular system appear within normal limits. No intracranial hemorrhage, mass lesion, or acute CVA. Vascular: Unremarkable Skull: Unremarkable Sinuses/Orbits: Prior right maxillary antrostomy. Other: No supplemental non-categorized findings. CT CERVICAL SPINE FINDINGS Alignment: 2 mm degenerative anterolisthesis at C6-7 and C7-T1. Skull base and vertebrae: Chronic defect in the anterior arch of C1 as shown on image 24/5, along with a chronic nonunited fracture at the junction of the lateral mass of C1 and the left  C1 lamina. Small ossicle along the left upper margin of the odontoid on image 20/5, with small erosions of the odontoid. Fused right facet joint at C2-3. Prominent anterior intervertebral spurring with articulating spurs at all levels between C3 and C7. No appreciable acute cervical spine fractures. Soft tissues and spinal canal: Pannus posterior to the odontoid measuring up to 6 mm in thickness. Common carotid atherosclerotic calcification bilaterally. Disc levels: Osseous foraminal narrowing on the right at C3-4 and T2-3; and on the left at C3-4, C4-5, C7-T1, T1-2, and T2-3. Upper chest: Abnormal right posterior pleural thickening likely from the previously seen pleural effusion, although slightly more lobular. Other: No supplemental non-categorized findings. IMPRESSION: 1. No acute intracranial findings or acute cervical spine findings. 2. 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 3. Erosions of C1 anteriorly and of the odontoid, with pannus posterior to the odontoid. 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 4. Multilevel osseous foraminal stenosis due to spurring in the cervical spine. 5. Right pleural effusion at the lung apex. Electronically Signed   By: Van Clines M.D.   On: 09/03/2018 21:36   Ct Cervical Spine Wo Contrast  Result Date: 09/03/2018 CLINICAL DATA:  Fall. Headache and neck pain. EXAM: CT HEAD WITHOUT CONTRAST CT CERVICAL SPINE WITHOUT CONTRAST TECHNIQUE: Multidetector CT imaging of the head and cervical spine was performed following the standard protocol without intravenous contrast. Multiplanar CT image reconstructions of the cervical spine were also generated. COMPARISON:  07/18/2018 CT head. Cervical spine radiographs from 11/12/2016 FINDINGS: CT HEAD FINDINGS Brain: The brainstem, cerebellum, cerebral peduncles, thalami, basal ganglia, basilar cisterns, and ventricular system appear within normal limits. No  intracranial hemorrhage, mass lesion, or acute CVA. Vascular: Unremarkable Skull: Unremarkable Sinuses/Orbits: Prior right maxillary antrostomy. Other: No supplemental non-categorized findings. CT CERVICAL SPINE FINDINGS Alignment: 2 mm degenerative anterolisthesis at C6-7 and C7-T1. Skull base and vertebrae: Chronic defect in the anterior arch of C1 as shown on image 24/5, along with a chronic nonunited fracture at the junction of the lateral mass of C1 and the left C1 lamina. Small ossicle along the left upper margin of the odontoid on image  20/5, with small erosions of the odontoid. Fused right facet joint at C2-3. Prominent anterior intervertebral spurring with articulating spurs at all levels between C3 and C7. No appreciable acute cervical spine fractures. Soft tissues and spinal canal: Pannus posterior to the odontoid measuring up to 6 mm in thickness. Common carotid atherosclerotic calcification bilaterally. Disc levels: Osseous foraminal narrowing on the right at C3-4 and T2-3; and on the left at C3-4, C4-5, C7-T1, T1-2, and T2-3. Upper chest: Abnormal right posterior pleural thickening likely from the previously seen pleural effusion, although slightly more lobular. Other: No supplemental non-categorized findings. IMPRESSION: 1. No acute intracranial findings or acute cervical spine findings. 2. 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 3. Erosions of C1 anteriorly and of the odontoid, with pannus posterior to the odontoid. 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 4. Multilevel osseous foraminal stenosis due to spurring in the cervical spine. 5. Right pleural effusion at the lung apex. Electronically Signed   By: Van Clines M.D.   On: 09/03/2018 21:36   Ct Hip Left W Contrast  Result Date: 10/01/2018 CLINICAL DATA:  Left hip pain EXAM: CT OF THE LOWER LEFT EXTREMITY WITH CONTRAST TECHNIQUE: Multidetector CT imaging of the  lower left extremity was performed according to the standard protocol following intravenous contrast administration. COMPARISON:  09/03/2018 CONTRAST:  132mL OMNIPAQUE IOHEXOL 300 MG/ML  SOLN FINDINGS: Bones/Joint/Cartilage An uncemented left total hip arthroplasty is identified with the tip traversing a left femoral diaphyseal fracture that is incompletely healed. Subtle residual fracture lucencies are suggested on the coronal reformats, series 6/46 through 48 for instance. There is callus formation developing about the fracture, series 3/143. Slight residual varus configuration about the fracture is seen of the femur. No new/acute appearing fracture or bone destruction is seen. With reference to the hip joint, no significant joint effusion or abnormal lucencies to suggest loosening. The prosthetic femoral head is seated within the acetabular component without evidence of asymmetric lining wear. No soft tissue mass or mineralization. No hematoma or abnormal fluid collections. Osteoarthritis of the included SI joints and pubic symphysis. No diastasis. Ligaments Suboptimally assessed by CT. Muscles and Tendons Negative for muscle edema, hematoma or definite tear. Soft tissues No soft tissue mass, mineralization or fluid collections. Postop scarring along the lateral aspect of the hip. IMPRESSION: 1. An uncemented left total hip arthroplasty is identified with the tip traversing a left femoral diaphyseal fracture that is incompletely healed. Subtle residual fracture lucencies are suggested on the coronal reformats, series 6/46 through 48. Slight residual varus configuration of the femur about the fracture is noted. 2. No new/acute appearing fracture or bone destruction is seen. 3. No significant joint effusion to suggest septic arthritis though this is of clinical concern, percutaneous sampling of the joint fluid could be attempted. No definite evidence of hardware loosening however. Electronically Signed   By: Ashley Royalty M.D.   On: 10/01/2018 20:08   US Abdomen Complete  Result Date: 09/06/2018 CLINICAL DATA:  Cirrhosis, status post cholecystectomy EXAM: ABDOMEN ULTRASOUND COMPLETE COMPARISON:  CT abdomen/pelvis dated 01/09/2015 FINDINGS: Gallbladder: Surgically absent. Common bile duct: Diameter: 20 mm, chronic Liver: Coarse pack echotexture with nodular hepatic contour, reflecting cirrhosis. Mild intrahepatic ductal dilatation in the left hepatic lobe, chronic. Portal vein is patent on color Doppler imaging with normal direction of blood flow towards the liver. IVC: No abnormality visualized. Pancreas: Not visualized due to overlying bowel gas. Spleen: Size and  appearance within normal limits. Right Kidney: Length: 11.2 cm. Poorly visualized. No mass or hydronephrosis. Left Kidney: Length: 10.8 cm.  No mass or hydronephrosis. Abdominal aorta: No aneurysm visualized. Other findings: Right pleural effusion. IMPRESSION: Cirrhosis.  No focal hepatic lesion is seen. Status post cholecystectomy. Dilated common duct, measuring 20 mm, chronic. Mild intrahepatic ductal dilatation, chronic. Right pleural effusion. Electronically Signed   By: Julian Hy M.D.   On: 09/06/2018 18:39   Dg Chest Port 1 View  Result Date: 10/01/2018 CLINICAL DATA:  LEFT hip replacement 6 months ago, sepsis, question LEFT leg/hip infection EXAM: PORTABLE CHEST 1 VIEW COMPARISON:  Portable exam 1756 hours compared to 10/01/2018 FINDINGS: RIGHT costophrenic angle excluded. Enlargement of cardiac silhouette. Mediastinal contours and pulmonary vascularity normal. Atherosclerotic calcification aorta. Small LEFT pleural effusion and basilar atelectasis at lower LEFT chest question partially loculated. No definite infiltrate or pneumothorax. Bones demineralized. IMPRESSION: LEFT pleural effusion and basilar atelectasis, question partially locule at the lower lateral LEFT chest. Electronically Signed   By: Lavonia Dana M.D.   On: 10/01/2018 18:05   Dg  Chest Port 1 View  Result Date: 09/04/2018 CLINICAL DATA:  Status post thoracentesis EXAM: PORTABLE CHEST 1 VIEW COMPARISON:  09/03/2018 FINDINGS: Right pleural effusion has resolved after thoracentesis. No pneumothorax. Left pleural thickening and peripheral basilar heterogeneous opacities are stable. Upper normal heart size. IMPRESSION: No pneumothorax post right thoracentesis. Electronically Signed   By: Marybelle Killings M.D.   On: 09/04/2018 10:30   Dg Hip Unilat W Or Wo Pelvis 2-3 Views Left  Result Date: 09/03/2018 CLINICAL DATA:  Per EMS, pt fell at home this afternoon. Pt complains of left leg knee pain, which EMS states he had before the fall. Pt has hx of chronic back pain. EXAM: DG HIP (WITH OR WITHOUT PELVIS) 2-3V LEFT COMPARISON:  03/15/2018 FINDINGS: No acute fracture. There are bilateral total hip arthroplasties appear well seated. A fracture of the proximal left femoral shaft has some persistent deformity, but appears healed when compared to the prior exam. Skeletal structures are diffusely demineralized. SI joints and symphysis pubis are normally aligned. IMPRESSION: 1. No acute fracture or dislocation. 2. No evidence of loosening of either total hip joint arthroplasty. Electronically Signed   By: Lajean Manes M.D.   On: 09/03/2018 21:07   Dg Femur Min 2 Views Left  Result Date: 09/03/2018 CLINICAL DATA:  Per EMS, pt fell at home this afternoon. Pt complains of left leg knee pain, which EMS states he had before the fall. Pt has hx of chronic back pain. EXAM: LEFT FEMUR 2 VIEWS COMPARISON:  03/15/2018 and 01/11/2018. FINDINGS: The previously seen, from 03/15/2018, fracture of the femoral shaft adjacent to the distal aspect the intramedullary stem of the femoral component the prosthesis, appears healed, but with some persistent deformity/angulation. No acute fracture. The left hip total arthroplasty appears well seated with no evidence of loosening. There are advanced degenerative changes of the  knee joint. Bones are diffusely demineralized.  Soft tissues are unremarkable. IMPRESSION: 1. No acute fracture or dislocation. Electronically Signed   By: Lajean Manes M.D.   On: 09/03/2018 21:09   US Thoracentesis Asp Pleural Space W/img Guide  Result Date: 09/04/2018 INDICATION: Right pleural effusion EXAM: ULTRASOUND GUIDED RIGHT THORACENTESIS MEDICATIONS: None. COMPLICATIONS: None immediate. PROCEDURE: An ultrasound guided thoracentesis was thoroughly discussed with the patient and questions answered. The benefits, risks, alternatives and complications were also discussed. The patient understands and wishes to proceed with the procedure. Written consent was  obtained. Ultrasound was performed to localize and mark an adequate pocket of fluid in the right chest. The area was then prepped and draped in the normal sterile fashion. 1% Lidocaine was used for local anesthesia. Under ultrasound guidance a 6 Fr Safe-T-Centesis catheter was introduced. Thoracentesis was performed. The catheter was removed and a dressing applied. FINDINGS: A total of approximately 1.1 L of clear yellow fluid was removed. Samples were sent to the laboratory as requested by the clinical team. IMPRESSION: Successful ultrasound guided right thoracentesis yielding 1.1 L of pleural fluid. Electronically Signed   By: Marybelle Killings M.D.   On: 09/04/2018 10:30     LOS: 0 days   Oren Binet, MD  [facility]  If 7PM-7AM, please contact night-coverage  Please page via [email or web address]-click on MD name and type text message  10/02/2018, 9:55 AM

## 2018-10-02 NOTE — Progress Notes (Signed)
Initial Nutrition Assessment  DOCUMENTATION CODES:   Morbid obesity  INTERVENTION:   Ensure Max protein supplement BID, each supplement provides 150kcal and 30g of protein.  MVI daily  Liberalize diet from heart healthy to 2 gram sodium diet   NUTRITION DIAGNOSIS:   Increased nutrient needs related to chronic illness(Cirrhosis ) as evidenced by increased estimated needs.  GOAL:   Patient will meet greater than or equal to 90% of their needs  MONITOR:   PO intake, Supplement acceptance, Labs, Weight trends, Skin, I & O's  REASON FOR ASSESSMENT:   Malnutrition Screening Tool    ASSESSMENT:   73 y.o. 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 Pt reports poor appetite and oral intake for the past month. Pt reports his appetite remains poor today; pt ate 25% of his breakfast this morning. Pt reports that he does not like the hospital foods. Pt does drink 2 Ensure plus per day at home; pt would like to have Ensure Max protein while in hospital. Per chart, pt with 45lb(13%) wt loss over the past 6 months; this is significant given the time frame. Pt reports some weight loss is r/t fluid changes but also feels he has lost mass as he reports his clothes are too big. RD will add supplements and MVI to help pt meet his estimated needs. Hyperkalemia resolved. Will liberalize pt's diet as a heart healthy diet is restrictive of protein.   Medications reviewed and include: melatonin, protonix, thiamine, metronidazole, vancomycin   Labs reviewed: K 4.8 wnl, BUN 44(H), creat 1.29(H)  NUTRITION - FOCUSED PHYSICAL EXAM:    Most Recent Value  Orbital Region  No depletion  Upper Arm Region  No depletion  Thoracic and Lumbar Region  No depletion  Buccal  Region  No depletion  Temple Region  No depletion  Clavicle Bone Region  No depletion  Clavicle and Acromion Bone Region  No depletion  Scapular Bone Region  No depletion  Dorsal Hand  No depletion  Patellar Region  No depletion  Anterior Thigh Region  No depletion  Posterior Calf Region  No depletion  Edema (RD Assessment)  Mild  Hair  Reviewed  Eyes  Reviewed  Mouth  Reviewed  Skin  Reviewed  Nails  Reviewed     Diet Order:   Diet Order            Diet 2 gram sodium Room service appropriate? Yes; Fluid consistency: Thin  Diet effective now             EDUCATION NEEDS:   Education needs have been addressed  Skin:  Skin Assessment: Reviewed RN Assessment(NPI buttocks, incision hip)  Last BM:  pta  Height:   Ht Readings from Last 1 Encounters:  10/01/18 5' 8"  (1.727 m)    Weight:   Wt Readings from Last 1 Encounters:  10/01/18 135.2 kg    Ideal Body Weight:  70 kg  BMI:  Body mass index is 45.31 kg/m.  Estimated Nutritional Needs:   Kcal:  2400-2700kcal/day   Protein:  >135g/day   Fluid:  2.1L/day or per MD  Koleen Distance MS, RD, LDN Pager #- [phone] Office#- [phone] After Hours Pager: [phone]

## 2018-10-03 DIAGNOSIS — E875 Hyperkalemia: Secondary | ICD-10-CM | POA: Diagnosis not present

## 2018-10-03 LAB — BASIC METABOLIC PANEL
Anion gap: 8 (ref 5–15)
BUN: 40 mg/dL — ABNORMAL HIGH (ref 8–23)
CO2: 21 mmol/L — ABNORMAL LOW (ref 22–32)
Calcium: 9.1 mg/dL (ref 8.9–10.3)
Chloride: 106 mmol/L (ref 98–111)
Creatinine, Ser: 1.21 mg/dL (ref 0.61–1.24)
GFR calc Af Amer: >60 mL/min (ref >60)
GFR calc non Af Amer: 59 mL/min — ABNORMAL LOW (ref >60)
Glucose, Bld: 105 mg/dL — ABNORMAL HIGH (ref 70–99)
Potassium: 5.2 mmol/L — ABNORMAL HIGH (ref 3.5–5.1)
Sodium: 135 mmol/L (ref 135–145)

## 2018-10-03 LAB — UREA NITROGEN, URINE: Urea Nitrogen, Ur: 473 mg/dL

## 2018-10-03 LAB — URINE CULTURE: Culture: 20000 — AB

## 2018-10-03 LAB — CBC
HCT: 34.9 % — ABNORMAL LOW (ref 39.0–52.0)
Hemoglobin: 11 g/dL — ABNORMAL LOW (ref 13.0–17.0)
MCH: 27.5 pg (ref 26.0–34.0)
MCHC: 31.5 g/dL (ref 30.0–36.0)
MCV: 87.3 fL (ref 80.0–100.0)
Platelets: 224 10*3/uL (ref 150–400)
RBC: 4 MIL/uL — ABNORMAL LOW (ref 4.22–5.81)
RDW: 17.5 % — ABNORMAL HIGH (ref 11.5–15.5)
WBC: 6.6 10*3/uL (ref 4.0–10.5)
nRBC: 0 % (ref 0.0–0.2)

## 2018-10-03 LAB — POTASSIUM
Potassium: 5.3 mmol/L — ABNORMAL HIGH (ref 3.5–5.1)
Potassium: 4.6 mmol/L (ref 3.5–5.1)

## 2018-10-03 LAB — GLUCOSE, CAPILLARY: Glucose-Capillary: 96 mg/dL (ref 70–99)

## 2018-10-03 MED ORDER — SODIUM POLYSTYRENE SULFONATE 15 GM/60ML PO SUSP
30.0000 g | Freq: Once | ORAL | Status: AC
  Administered 2018-10-03: 30 g via ORAL
  Filled 2018-10-03: qty 120

## 2018-10-03 MED ORDER — ACETAMINOPHEN 325 MG PO TABS
650.0000 mg | ORAL_TABLET | Freq: Four times a day (QID) | ORAL | Status: DC | PRN
  Administered 2018-12-06: 08:00:00 650 mg via ORAL
  Filled 2018-10-03: qty 2

## 2018-10-03 MED ORDER — METRONIDAZOLE 500 MG PO TABS
500.0000 mg | ORAL_TABLET | Freq: Three times a day (TID) | ORAL | Status: DC
  Administered 2018-10-03 – 2018-10-15 (×36): 500 mg via ORAL
  Filled 2018-10-03 (×36): qty 1

## 2018-10-03 MED ORDER — DILTIAZEM HCL ER COATED BEADS 240 MG PO CP24
240.0000 mg | ORAL_CAPSULE | Freq: Every day | ORAL | Status: DC
  Administered 2018-10-04 – 2018-10-08 (×4): 240 mg via ORAL
  Filled 2018-10-03 (×6): qty 1

## 2018-10-03 MED ORDER — DILTIAZEM HCL 25 MG/5ML IV SOLN
10.0000 mg | Freq: Four times a day (QID) | INTRAVENOUS | Status: DC | PRN
  Filled 2018-10-03: qty 5

## 2018-10-03 NOTE — Progress Notes (Signed)
PROGRESS NOTE        PATIENT DETAILS Name: [name] Age: 73 y.o. Sex: male Date of Birth: Jul 22, 1946 Admit Date: 10/01/2018 Admitting Physician Vianne Bulls, MD [id number], Hollice Espy, MD  Brief Narrative: Patient is a 73 y.o. male with history of A. 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  Found to have a soft tissue infection of the left hip area, acute kidney injury and hyperkalemia.  Subsequently admitted to the hospitalist service.  See below for further details  Subjective: Patient in bed, appears comfortable, denies any headache, no fever, no chest pain or pressure, no shortness of breath , no abdominal pain. No focal weakness. Improved L.Hip pain.   Assessment/Plan:  AKI due to ATN from Infection with hyperkalemia: He was also taking diuretics including Aldactone, offending medication was held, he was hydrated, also received Kayexalate for hyperkalemia protocol.  ARF and hyperkalemia have resolved.  Left hip soft tissue infection: Soft tissue CT scan of the left hip does not show any joint involvement.  He has been placed on IV vancomycin along with home medication Flagyl which he takes for chronic left hip prosthetic joint infection.  Soft tissue infection has improved.  Continue to monitor cultures along with CBC and temperature curve.  No signs of sepsis currently.  History of left total hip arthroplasty with subsequent infection with Bacteroides fragilis: His orthopedic  surgeon here in [place] is Dr. Henrietta Dine however was evaluated by orthopedics and ID and [facility] once he had left hip infection.  He is on Flagyl-with planned end date November 19, 2018.   Recurrent pleural effusion: Felt to be secondary to hepatic hydrothorax-recent chest x-ray shows a small left pleural effusion seen on x-ray.  Has had extensive work-up including multiple thoracocentesis in the past.  Will resume diuretics in the morning as renal function has improved.  Cryptogenic cirrhosis: Prior hepatitis serology done earlier this month was negative-agree with plans outlined in prior discharge summary for outpatient follow-up.  Recheck renal function tomorrow-if creatinine is still stable-suspect diuretics can be resumed.  Volume status is currently stable.  Chronic atrial fibrillation with [place] vas 2 score of at least 2: Rate controlled with Cardizem and metoprolol, continue Eliquis.  Chronic back and right shoulder pain: Continue Lidoderm patch, Tylenol and as needed narcotics.  Bipolar disorder: Stable-continue Lexapro, Neurontin  OSA: Continue CPAP nightly  Morbid obesity  Acute on chronic debility/deconditioning: PT requested, may require SNF.  DVT Prophylaxis: Full dose anticoagulation with Eliquis  Code Status: Full code  Family Communication: None at bedside  Disposition Plan: Remain inpatient-but will plan on Home health vs SNF on discharge  Antimicrobial agents: Anti-infectives (From admission, onward)   Start     Dose/Rate Route Frequency Ordered Stop   10/03/18 0700  vancomycin (VANCOCIN) 2,000 mg in sodium chloride 0.9 % 500 mL IVPB     2,000 mg 250 mL/hr over 120 Minutes Intravenous Every 36 hours 10/01/18 1942  10/02/18 0200  ceFEPIme (MAXIPIME) 2 g in sodium chloride 0.9 % 100 mL IVPB  Status:  Discontinued     2 g 200 mL/hr over 30 Minutes Intravenous Every 8 hours 10/01/18 1942 10/02/18 1008   10/01/18 1830  vancomycin (VANCOCIN)  2,500 mg in sodium chloride 0.9 % 500 mL IVPB     2,500 mg 250 mL/hr over 120 Minutes Intravenous STAT 10/01/18 1825 10/02/18 0126   10/01/18 1800  ceFEPIme (MAXIPIME) 2 g in sodium chloride 0.9 % 100 mL IVPB     2 g 200 mL/hr over 30 Minutes Intravenous  Once 10/01/18 1747 10/01/18 1903   10/01/18 1800  metroNIDAZOLE (FLAGYL) IVPB 500 mg     500 mg 100 mL/hr over 60 Minutes Intravenous Every 8 hours 10/01/18 1747     10/01/18 1800  vancomycin (VANCOCIN) IVPB 1000 mg/200 mL premix  Status:  Discontinued     1,000 mg 200 mL/hr over 60 Minutes Intravenous  Once 10/01/18 1747 10/01/18 1825      Procedures: None  CONSULTS:  None  Time spent: 25- minutes-Greater than 50% of this time was spent in counseling, explanation of diagnosis, planning of further management, and coordination of care.  MEDICATIONS: Scheduled Meds: . apixaban  5 mg Oral BID  . cycloSPORINE  1 drop Both Eyes BID  . [START ON 10/04/2018] diltiazem  240 mg Oral Daily  . escitalopram  10 mg Oral Daily  . gabapentin  100 mg Oral TID  . lidocaine  1 patch Transdermal Q24H  . Melatonin  6 mg Oral QHS  . metoprolol tartrate  25 mg Oral BID  . multivitamin with minerals  1 tablet Oral Daily  . pantoprazole  40 mg Oral Daily  . ENSURE MAX PROTEIN  11 oz Oral BID  . thiamine  100 mg Oral Daily   Continuous Infusions: . metronidazole 500 mg (10/03/18 1013)  . vancomycin     PRN Meds:.acetaminophen, albuterol, diltiazem, hydrOXYzine, [DISCONTINUED] ondansetron **OR** ondansetron (ZOFRAN) IV, oxyCODONE, polyethylene glycol   PHYSICAL EXAM: Vital signs: Vitals:   10/02/18 1026 10/02/18 1409 10/02/18 2141 10/03/18 0453  BP: 96/60 (!) 91/56 101/73 106/66  Pulse:  (!) 109 (!) 119 (!) 105  Resp:  18 18 18   Temp:  97.7 F (36.5 C) 99.1 F (37.3 C) 97.8 F (36.6 C)  TempSrc:  Oral Oral   SpO2:  96% 96% 99%  Weight:      Height:       Filed Weights   10/01/18 1725  Weight: 135.2 kg   Body mass index is 45.31  kg/m.   General appearance :Awake, alert, not in any distress.  HEENT: Atraumatic and Normocephalic Neck: supple Resp:Good air entry bilaterally, no added sounds  CVS: S1 S2 regular, no murmurs.  GI: Bowel sounds present, Non tender and not distended with no gaurding, rigidity or rebound.No organomegaly. Extremities: B/L Lower Ext shows no edema, both legs are warm to touch.  See left hip picture below Neurology: Has generalized weakness-but nonfocal Psychiatric: Normal judgment and insight. Alert and oriented x 3. Normal mood. Musculoskeletal:No digital cyanosis Skin:No Rash, warm and dry Wounds:N/A      I have personally reviewed following labs and imaging studies  LABORATORY DATA: CBC: Recent Labs  Lab 10/01/18 1835 10/02/18 0112 10/03/18 0531  WBC 11.6* 9.1 6.6  NEUTROABS 9.4* 6.4  --   HGB 13.1 11.3* 11.0*  HCT 42.9 35.5* 34.9*  MCV 87.9 85.3 87.3  PLT 248 228 224  Basic Metabolic Panel: Recent Labs  Lab 10/01/18 1835  10/02/18 0524  10/02/18 1755 10/02/18 2132 10/03/18 0100 10/03/18 0531 10/03/18 0912  NA 133*  --  132*  --   --   --   --  135  --   K 6.2*   < > 4.6   < > 5.0 5.3* 5.3* 5.2* 4.6  CL 99  --  104  --   --   --   --  106  --   CO2 23  --  20*  --   --   --   --  21*  --   GLUCOSE 103*  --  104*  --   --   --   --  105*  --   BUN 50*  --  44*  --   --   --   --  40*  --   CREATININE 1.50*  --  1.29*  --   --   --   --  1.21  --   CALCIUM 9.9  --  9.5  --   --   --   --  9.1  --    < > = values in this interval not displayed.    GFR: Estimated Creatinine Clearance: 74.2 mL/min (by C-G formula based on SCr of 1.21 mg/dL).  Liver Function Tests: Recent Labs  Lab 10/01/18 1835  AST 19  ALT 11  ALKPHOS 56  BILITOT 0.5  PROT 7.4  ALBUMIN 3.1*   No results for input(s): LIPASE, AMYLASE in the last 168 hours. No results for input(s): AMMONIA in the last 168 hours.  Coagulation Profile: No results for input(s): INR, PROTIME in the  last 168 hours.  Cardiac Enzymes: No results for input(s): CKTOTAL, CKMB, CKMBINDEX, TROPONINI in the last 168 hours.  BNP (last 3 results) No results for input(s): PROBNP in the last 8760 hours.  HbA1C: No results for input(s): HGBA1C in the last 72 hours.  CBG: Recent Labs  Lab 10/03/18 0747  GLUCAP 96    Lipid Profile: No results for input(s): CHOL, HDL, LDLCALC, TRIG, CHOLHDL, LDLDIRECT in the last 72 hours.  Thyroid Function Tests: No results for input(s): TSH, T4TOTAL, FREET4, T3FREE, THYROIDAB in the last 72 hours.  Anemia Panel: No results for input(s): VITAMINB12, FOLATE, FERRITIN, TIBC, IRON, RETICCTPCT in the last 72 hours.  Urine analysis:    Component Value Date/Time   COLORURINE YELLOW 10/01/2018 1822   APPEARANCEUR CLEAR 10/01/2018 1822   APPEARANCEUR Clear 01/18/2018 1539   LABSPEC 1.010 10/01/2018 1822   PHURINE 5.0 10/01/2018 1822   GLUCOSEU NEGATIVE 10/01/2018 1822   HGBUR NEGATIVE 10/01/2018 1822   BILIRUBINUR NEGATIVE 10/01/2018 1822   BILIRUBINUR Negative 01/18/2018 1539   KETONESUR NEGATIVE 10/01/2018 1822   PROTEINUR NEGATIVE 10/01/2018 1822   UROBILINOGEN 0.2 09/06/2012 0213   NITRITE NEGATIVE 10/01/2018 1822   LEUKOCYTESUR NEGATIVE 10/01/2018 1822   LEUKOCYTESUR Negative 01/18/2018 1539    Sepsis Labs: Lactic Acid, Venous    Component Value Date/Time   LATICACIDVEN 1.7 10/01/2018 1833    MICROBIOLOGY: Recent Results (from the past 240 hour(s))  Blood Culture (routine x 2)     Status: None (Preliminary result)   Collection Time: 10/01/18  6:15 PM  Result Value Ref Range Status   Specimen Description BLOOD LEFT HAND  Final   Special Requests   Final    BOTTLES DRAWN AEROBIC AND ANAEROBIC Blood Culture adequate volume   Culture NO GROWTH 2 DAYS  Final   Report Status PENDING  Incomplete  Blood Culture (routine x 2)     Status: None (Preliminary result)   Collection Time: 10/01/18  6:17 PM  Result Value Ref Range Status    Specimen Description BLOOD RIGHT ANTECUBITAL  Final   Special Requests   Final    BOTTLES DRAWN AEROBIC AND ANAEROBIC Blood Culture adequate volume   Culture NO GROWTH 2 DAYS  Final   Report Status PENDING  Incomplete  Urine culture     Status: Abnormal   Collection Time: 10/01/18  6:24 PM  Result Value Ref Range Status   Specimen Description URINE, CLEAN CATCH  Final   Special Requests   Final    NONE Performed at [facility], [place] [address] [place], [place] [address]    Culture 20,000 COLONIES/mL PROTEUS MIRABILIS (A)  Final   Report Status 10/03/2018 FINAL  Final   Organism ID, Bacteria PROTEUS MIRABILIS (A)  Final      Susceptibility   Proteus mirabilis - MIC*    AMPICILLIN <=2 SENSITIVE Sensitive     CEFAZOLIN <=4 SENSITIVE Sensitive     CEFTRIAXONE <=1 SENSITIVE Sensitive     CIPROFLOXACIN <=0.25 SENSITIVE Sensitive     GENTAMICIN <=1 SENSITIVE Sensitive     IMIPENEM 8 INTERMEDIATE Intermediate     NITROFURANTOIN 128 RESISTANT Resistant     TRIMETH/SULFA <=20 SENSITIVE Sensitive     AMPICILLIN/SULBACTAM <=2 SENSITIVE Sensitive     PIP/TAZO <=4 SENSITIVE Sensitive     * 20,000 COLONIES/mL PROTEUS MIRABILIS    RADIOLOGY STUDIES/RESULTS: Dg Chest 1 View  Result Date: 10/01/2018 CLINICAL DATA:  Follow-up pneumothorax. EXAM: CHEST  1 VIEW COMPARISON:  09/09/2018 FINDINGS: Mild bilateral interstitial thickening. Small bilateral pleural effusions. Left lateral pleural thickening with a subtle lucency which may reflect a small loculated pneumothorax versus artifact. Right infrahilar hazy airspace disease likely reflecting atelectasis. Stable cardiomediastinal silhouette. No aggressive osseous lesion. Mild osteoarthritis of bilateral glenohumeral joints. IMPRESSION: Left lateral pleural thickening with a subtle lucency which may reflect a small loculated pneumothorax versus artifact. Recommend follow-up chest x-ray for re-evaluation. Bilateral interstitial thickening and small  bilateral pleural effusions concerning for mild pulmonary edema. Electronically Signed   By: Kathreen Devoid   On: 10/01/2018 15:38   Dg Chest 2 View  Result Date: 09/09/2018 CLINICAL DATA:  BILATERAL pleural effusions, shortness of breath, weakness, history hypertension, CHF, former smoker EXAM: CHEST - 2 VIEW COMPARISON:  09/08/2018 FINDINGS: Enlargement of cardiac silhouette. Atherosclerotic calcification aorta. Mediastinal contours and pulmonary vascularity normal. Bibasilar effusions and atelectasis. Upper lungs clear. No pleural effusion or pneumothorax. Degenerative disc disease changes thoracic spine as well as BILATERAL glenohumeral degenerative changes. IMPRESSION: Enlargement of cardiac silhouette with bibasilar pleural effusions and atelectasis. Electronically Signed   By: Lavonia Dana M.D.   On: 09/09/2018 15:08   Dg Chest 2 View  Result Date: 09/08/2018 CLINICAL DATA:  Recurrent pleural effusion on the right EXAM: CHEST - 2 VIEW COMPARISON:  Three days ago FINDINGS: Unchanged small to moderate right pleural effusion that appears to be layering. Unchanged pleural thickening on the left. Chronic cardiomegaly. Stable interstitial coarsening. IMPRESSION: 1. Unchanged small to moderate right pleural effusion. 2. Stable pleural thickening on the left. Electronically Signed   By: Monte Fantasia M.D.   On: 09/08/2018 08:38   Dg Chest 2 View  Result Date: 09/05/2018 CLINICAL DATA:  One day post thoracentesis. EXAM: CHEST - 2 VIEW COMPARISON:  09/04/2018 FINDINGS: Cardiac silhouette is mildly  enlarged. There is interstitial thickening that appears increased compared to the prior. Left pleural effusion is stable. There has been increase in right pleural fluid. No pneumothorax. Skeletal structures are grossly intact. IMPRESSION: 1. Worsened lung aeration compared to the previous day's study. Findings are consistent with interstitial pulmonary edema, likely due to congestive heart failure, with pleural  effusions. Right pleural effusion has increased from the previous day's exam. 2. No pneumothorax. Electronically Signed   By: Lajean Manes M.D.   On: 09/05/2018 16:01   Dg Chest 2 View  Result Date: 09/03/2018 CLINICAL DATA:  Per EMS, pt fell at home this afternoon. Pt complains of left leg knee pain, which EMS states he had before the fall. Pt has hx of chronic back pain. EXAM: CHEST - 2 VIEW COMPARISON:  08/29/2018 and older studies. FINDINGS: Cardiac silhouette is mildly enlarged. No mediastinal or hilar masses. There is bilateral lung base opacity, greater on the right. Opacity is consistent with a combination of small effusions and either atelectasis or infiltrate. Right lung base opacity appears increased from the most recent prior exam. There are prominent bronchovascular markings elsewhere. Linear opacities noted in the left mid lung peripherally consistent with scarring. These findings are stable. No pneumothorax. Skeletal structures are demineralized but grossly intact. IMPRESSION: 1. Bilateral lung base opacities, right greater than left, with the right appearing increased compared to the most recent prior chest radiograph, left stable. Lung base opacities are consistent with a combination of pleural fluid and either atelectasis or infection. No convincing pulmonary edema. Electronically Signed   By: Lajean Manes M.D.   On: 09/03/2018 21:05   Dg Shoulder Right  Result Date: 09/05/2018 CLINICAL DATA:  Chronic right shoulder pain. EXAM: RIGHT SHOULDER - 2+ VIEW COMPARISON:  Plain films right shoulder 05/17/2018. FINDINGS: The patient has bone-on-bone glenohumeral osteoarthritis with small subchondral cysts, sclerosis and an osteophyte off the humeral head. Loose bodies in the subscapularis recess noted. Moderate acromioclavicular osteoarthritis is seen. No acute bony abnormality. IMPRESSION: No acute finding. 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 Electronically Signed    By: Inge Rise M.D.   On: 09/05/2018 16:02   Ct Head Wo Contrast  Result Date: 09/03/2018 CLINICAL DATA:  Fall. Headache and neck pain. EXAM: CT HEAD WITHOUT CONTRAST CT CERVICAL SPINE WITHOUT CONTRAST TECHNIQUE: Multidetector CT imaging of the head and cervical spine was performed following the standard protocol without intravenous contrast. Multiplanar CT image reconstructions of the cervical spine were also generated. COMPARISON:  07/18/2018 CT head. Cervical spine radiographs from 11/12/2016 FINDINGS: CT HEAD FINDINGS Brain: The brainstem, cerebellum, cerebral peduncles, thalami, basal ganglia, basilar cisterns, and ventricular system appear within normal limits. No intracranial hemorrhage, mass lesion, or acute CVA. Vascular: Unremarkable Skull: Unremarkable Sinuses/Orbits: Prior right maxillary antrostomy. Other: No supplemental non-categorized findings. CT CERVICAL SPINE FINDINGS Alignment: 2 mm degenerative anterolisthesis at C6-7 and C7-T1. Skull base and vertebrae: Chronic defect in the anterior arch of C1 as shown on image 24/5, along with a chronic nonunited fracture at the junction of the lateral mass of C1 and the left C1 lamina. Small ossicle along the left upper margin of the odontoid on image 20/5, with small erosions of the odontoid. Fused right facet joint at C2-3. Prominent anterior intervertebral spurring with articulating spurs at all levels between C3 and C7. No appreciable acute cervical spine fractures. Soft tissues and spinal canal: Pannus posterior to the odontoid measuring up to 6 mm in thickness. Common carotid atherosclerotic calcification bilaterally. Disc levels: Osseous foraminal  narrowing on the right at C3-4 and T2-3; and on the left at C3-4, C4-5, C7-T1, T1-2, and T2-3. Upper chest: Abnormal right posterior pleural thickening likely from the previously seen pleural effusion, although slightly more lobular. Other: No supplemental non-categorized findings. IMPRESSION: 1.  No acute intracranial findings or acute cervical spine findings. 2. 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 3. Erosions of C1 anteriorly and of the odontoid, with pannus posterior to the odontoid. 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 4. Multilevel osseous foraminal stenosis due to spurring in the cervical spine. 5. Right pleural effusion at the lung apex. Electronically Signed   By: Van Clines M.D.   On: 09/03/2018 21:36   Ct Cervical Spine Wo Contrast  Result Date: 09/03/2018 CLINICAL DATA:  Fall. Headache and neck pain. EXAM: CT HEAD WITHOUT CONTRAST CT CERVICAL SPINE WITHOUT CONTRAST TECHNIQUE: Multidetector CT imaging of the head and cervical spine was performed following the standard protocol without intravenous contrast. Multiplanar CT image reconstructions of the cervical spine were also generated. COMPARISON:  07/18/2018 CT head. Cervical spine radiographs from 11/12/2016 FINDINGS: CT HEAD FINDINGS Brain: The brainstem, cerebellum, cerebral peduncles, thalami, basal ganglia, basilar cisterns, and ventricular system appear within normal limits. No intracranial hemorrhage, mass lesion, or acute CVA. Vascular: Unremarkable Skull: Unremarkable Sinuses/Orbits: Prior right maxillary antrostomy. Other: No supplemental non-categorized findings. CT CERVICAL SPINE FINDINGS Alignment: 2 mm degenerative anterolisthesis at C6-7 and C7-T1. Skull base and vertebrae: Chronic defect in the anterior arch of C1 as shown on image 24/5, along with a chronic nonunited fracture at the junction of the lateral mass of C1 and the left C1 lamina. Small ossicle along the left upper margin of the odontoid on image 20/5, with small erosions of the odontoid. Fused right facet joint at C2-3. Prominent anterior intervertebral spurring with articulating spurs at all levels between C3 and C7. No appreciable acute cervical spine fractures. Soft tissues and  spinal canal: Pannus posterior to the odontoid measuring up to 6 mm in thickness. Common carotid atherosclerotic calcification bilaterally. Disc levels: Osseous foraminal narrowing on the right at C3-4 and T2-3; and on the left at C3-4, C4-5, C7-T1, T1-2, and T2-3. Upper chest: Abnormal right posterior pleural thickening likely from the previously seen pleural effusion, although slightly more lobular. Other: No supplemental non-categorized findings. IMPRESSION: 1. No acute intracranial findings or acute cervical spine findings. 2. 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 3. Erosions of C1 anteriorly and of the odontoid, with pannus posterior to the odontoid. 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 4. Multilevel osseous foraminal stenosis due to spurring in the cervical spine. 5. Right pleural effusion at the lung apex. Electronically Signed   By: Van Clines M.D.   On: 09/03/2018 21:36   Ct Hip Left W Contrast  Result Date: 10/01/2018 CLINICAL DATA:  Left hip pain EXAM: CT OF THE LOWER LEFT EXTREMITY WITH CONTRAST TECHNIQUE: Multidetector CT imaging of the lower left extremity was performed according to the standard protocol following intravenous contrast administration. COMPARISON:  09/03/2018 CONTRAST:  166mL OMNIPAQUE IOHEXOL 300 MG/ML  SOLN FINDINGS: Bones/Joint/Cartilage An uncemented left total hip arthroplasty is identified with the tip traversing a left femoral diaphyseal fracture that is incompletely healed. Subtle residual fracture lucencies are suggested on the coronal reformats, series 6/46 through 48 for instance. There is callus formation developing about the fracture, series 3/143. Slight  residual varus configuration about the fracture is seen of the femur. No new/acute appearing fracture or bone destruction is seen. With reference to the hip joint, no significant joint effusion or abnormal lucencies to suggest  loosening. The prosthetic femoral head is seated within the acetabular component without evidence of asymmetric lining wear. No soft tissue mass or mineralization. No hematoma or abnormal fluid collections. Osteoarthritis of the included SI joints and pubic symphysis. No diastasis. Ligaments Suboptimally assessed by CT. Muscles and Tendons Negative for muscle edema, hematoma or definite tear. Soft tissues No soft tissue mass, mineralization or fluid collections. Postop scarring along the lateral aspect of the hip. IMPRESSION: 1. An uncemented left total hip arthroplasty is identified with the tip traversing a left femoral diaphyseal fracture that is incompletely healed. Subtle residual fracture lucencies are suggested on the coronal reformats, series 6/46 through 48. Slight residual varus configuration of the femur about the fracture is noted. 2. No new/acute appearing fracture or bone destruction is seen. 3. No significant joint effusion to suggest septic arthritis though this is of clinical concern, percutaneous sampling of the joint fluid could be attempted. No definite evidence of hardware loosening however. Electronically Signed   By: Ashley Royalty M.D.   On: 10/01/2018 20:08   US Abdomen Complete  Result Date: 09/06/2018 CLINICAL DATA:  Cirrhosis, status post cholecystectomy EXAM: ABDOMEN ULTRASOUND COMPLETE COMPARISON:  CT abdomen/pelvis dated 01/09/2015 FINDINGS: Gallbladder: Surgically absent. Common bile duct: Diameter: 20 mm, chronic Liver: Coarse pack echotexture with nodular hepatic contour, reflecting cirrhosis. Mild intrahepatic ductal dilatation in the left hepatic lobe, chronic. Portal vein is patent on color Doppler imaging with normal direction of blood flow towards the liver. IVC: No abnormality visualized. Pancreas: Not visualized due to overlying bowel gas. Spleen: Size and appearance within normal limits. Right Kidney: Length: 11.2 cm. Poorly visualized. No mass or hydronephrosis. Left  Kidney: Length: 10.8 cm.  No mass or hydronephrosis. Abdominal aorta: No aneurysm visualized. Other findings: Right pleural effusion. IMPRESSION: Cirrhosis.  No focal hepatic lesion is seen. Status post cholecystectomy. Dilated common duct, measuring 20 mm, chronic. Mild intrahepatic ductal dilatation, chronic. Right pleural effusion. Electronically Signed   By: Julian Hy M.D.   On: 09/06/2018 18:39   Dg Chest Port 1 View  Result Date: 10/01/2018 CLINICAL DATA:  LEFT hip replacement 6 months ago, sepsis, question LEFT leg/hip infection EXAM: PORTABLE CHEST 1 VIEW COMPARISON:  Portable exam 1756 hours compared to 10/01/2018 FINDINGS: RIGHT costophrenic angle excluded. Enlargement of cardiac silhouette. Mediastinal contours and pulmonary vascularity normal. Atherosclerotic calcification aorta. Small LEFT pleural effusion and basilar atelectasis at lower LEFT chest question partially loculated. No definite infiltrate or pneumothorax. Bones demineralized. IMPRESSION: LEFT pleural effusion and basilar atelectasis, question partially locule at the lower lateral LEFT chest. Electronically Signed   By: Lavonia Dana M.D.   On: 10/01/2018 18:05   Dg Chest Port 1 View  Result Date: 09/04/2018 CLINICAL DATA:  Status post thoracentesis EXAM: PORTABLE CHEST 1 VIEW COMPARISON:  09/03/2018 FINDINGS: Right pleural effusion has resolved after thoracentesis. No pneumothorax. Left pleural thickening and peripheral basilar heterogeneous opacities are stable. Upper normal heart size. IMPRESSION: No pneumothorax post right thoracentesis. Electronically Signed   By: Marybelle Killings M.D.   On: 09/04/2018 10:30   Dg Hip Unilat W Or Wo Pelvis 2-3 Views Left  Result Date: 09/03/2018 CLINICAL DATA:  Per EMS, pt fell at home this afternoon. Pt complains of left leg knee pain, which EMS states he had before the fall.  Pt has hx of chronic back pain. EXAM: DG HIP (WITH OR WITHOUT PELVIS) 2-3V LEFT COMPARISON:  03/15/2018 FINDINGS: No  acute fracture. There are bilateral total hip arthroplasties appear well seated. A fracture of the proximal left femoral shaft has some persistent deformity, but appears healed when compared to the prior exam. Skeletal structures are diffusely demineralized. SI joints and symphysis pubis are normally aligned. IMPRESSION: 1. No acute fracture or dislocation. 2. No evidence of loosening of either total hip joint arthroplasty. Electronically Signed   By: Lajean Manes M.D.   On: 09/03/2018 21:07   Dg Femur Min 2 Views Left  Result Date: 09/03/2018 CLINICAL DATA:  Per EMS, pt fell at home this afternoon. Pt complains of left leg knee pain, which EMS states he had before the fall. Pt has hx of chronic back pain. EXAM: LEFT FEMUR 2 VIEWS COMPARISON:  03/15/2018 and 01/11/2018. FINDINGS: The previously seen, from 03/15/2018, fracture of the femoral shaft adjacent to the distal aspect the intramedullary stem of the femoral component the prosthesis, appears healed, but with some persistent deformity/angulation. No acute fracture. The left hip total arthroplasty appears well seated with no evidence of loosening. There are advanced degenerative changes of the knee joint. Bones are diffusely demineralized.  Soft tissues are unremarkable. IMPRESSION: 1. No acute fracture or dislocation. Electronically Signed   By: Lajean Manes M.D.   On: 09/03/2018 21:09   US Thoracentesis Asp Pleural Space W/img Guide  Result Date: 09/04/2018 INDICATION: Right pleural effusion EXAM: ULTRASOUND GUIDED RIGHT THORACENTESIS MEDICATIONS: None. COMPLICATIONS: None immediate. PROCEDURE: An ultrasound guided thoracentesis was thoroughly discussed with the patient and questions answered. The benefits, risks, alternatives and complications were also discussed. The patient understands and wishes to proceed with the procedure. Written consent was obtained. Ultrasound was performed to localize and mark an adequate pocket of fluid in the right chest.  The area was then prepped and draped in the normal sterile fashion. 1% Lidocaine was used for local anesthesia. Under ultrasound guidance a 6 Fr Safe-T-Centesis catheter was introduced. Thoracentesis was performed. The catheter was removed and a dressing applied. FINDINGS: A total of approximately 1.1 L of clear yellow fluid was removed. Samples were sent to the laboratory as requested by the clinical team. IMPRESSION: Successful ultrasound guided right thoracentesis yielding 1.1 L of pleural fluid. Electronically Signed   By: Marybelle Killings M.D.   On: 09/04/2018 10:30     LOS: 0 days   Lala Lund, MD  [facility]  If 7PM-7AM, please contact night-coverage  Please page via [email or web address]-click on MD name and type text message  10/03/2018, 10:19 AM

## 2018-10-04 DIAGNOSIS — Z515 Encounter for palliative care: Secondary | ICD-10-CM | POA: Diagnosis not present

## 2018-10-04 DIAGNOSIS — M549 Dorsalgia, unspecified: Secondary | ICD-10-CM | POA: Diagnosis not present

## 2018-10-04 DIAGNOSIS — L03116 Cellulitis of left lower limb: Secondary | ICD-10-CM | POA: Diagnosis present

## 2018-10-04 DIAGNOSIS — Z7901 Long term (current) use of anticoagulants: Secondary | ICD-10-CM | POA: Diagnosis not present

## 2018-10-04 DIAGNOSIS — Z811 Family history of alcohol abuse and dependence: Secondary | ICD-10-CM | POA: Diagnosis not present

## 2018-10-04 DIAGNOSIS — T8452XD Infection and inflammatory reaction due to internal left hip prosthesis, subsequent encounter: Secondary | ICD-10-CM | POA: Diagnosis not present

## 2018-10-04 DIAGNOSIS — N179 Acute kidney failure, unspecified: Secondary | ICD-10-CM | POA: Diagnosis not present

## 2018-10-04 DIAGNOSIS — E875 Hyperkalemia: Secondary | ICD-10-CM | POA: Diagnosis present

## 2018-10-04 DIAGNOSIS — M25551 Pain in right hip: Secondary | ICD-10-CM | POA: Diagnosis not present

## 2018-10-04 DIAGNOSIS — Z87891 Personal history of nicotine dependence: Secondary | ICD-10-CM | POA: Diagnosis not present

## 2018-10-04 DIAGNOSIS — I110 Hypertensive heart disease with heart failure: Secondary | ICD-10-CM | POA: Diagnosis present

## 2018-10-04 DIAGNOSIS — K7469 Other cirrhosis of liver: Secondary | ICD-10-CM | POA: Diagnosis not present

## 2018-10-04 DIAGNOSIS — N170 Acute kidney failure with tubular necrosis: Secondary | ICD-10-CM | POA: Diagnosis present

## 2018-10-04 DIAGNOSIS — G620 Drug-induced polyneuropathy: Secondary | ICD-10-CM | POA: Diagnosis not present

## 2018-10-04 DIAGNOSIS — Z91030 Bee allergy status: Secondary | ICD-10-CM | POA: Diagnosis not present

## 2018-10-04 DIAGNOSIS — Z809 Family history of malignant neoplasm, unspecified: Secondary | ICD-10-CM | POA: Diagnosis not present

## 2018-10-04 DIAGNOSIS — Z6841 Body Mass Index (BMI) 40.0 and over, adult: Secondary | ICD-10-CM | POA: Diagnosis not present

## 2018-10-04 DIAGNOSIS — K219 Gastro-esophageal reflux disease without esophagitis: Secondary | ICD-10-CM | POA: Diagnosis present

## 2018-10-04 DIAGNOSIS — A419 Sepsis, unspecified organism: Secondary | ICD-10-CM | POA: Diagnosis present

## 2018-10-04 DIAGNOSIS — T378X5S Adverse effect of other specified systemic anti-infectives and antiparasitics, sequela: Secondary | ICD-10-CM | POA: Diagnosis not present

## 2018-10-04 DIAGNOSIS — T8459XD Infection and inflammatory reaction due to other internal joint prosthesis, subsequent encounter: Secondary | ICD-10-CM | POA: Diagnosis not present

## 2018-10-04 DIAGNOSIS — I482 Chronic atrial fibrillation, unspecified: Secondary | ICD-10-CM | POA: Diagnosis present

## 2018-10-04 DIAGNOSIS — J90 Pleural effusion, not elsewhere classified: Secondary | ICD-10-CM | POA: Diagnosis not present

## 2018-10-04 DIAGNOSIS — M00852 Arthritis due to other bacteria, left hip: Secondary | ICD-10-CM | POA: Diagnosis not present

## 2018-10-04 DIAGNOSIS — Z9989 Dependence on other enabling machines and devices: Secondary | ICD-10-CM | POA: Diagnosis not present

## 2018-10-04 DIAGNOSIS — T8452XA Infection and inflammatory reaction due to internal left hip prosthesis, initial encounter: Secondary | ICD-10-CM | POA: Diagnosis present

## 2018-10-04 DIAGNOSIS — Y831 Surgical operation with implant of artificial internal device as the cause of abnormal reaction of the patient, or of later complication, without mention of misadventure at the time of the procedure: Secondary | ICD-10-CM | POA: Diagnosis present

## 2018-10-04 DIAGNOSIS — G4733 Obstructive sleep apnea (adult) (pediatric): Secondary | ICD-10-CM | POA: Diagnosis present

## 2018-10-04 DIAGNOSIS — I480 Paroxysmal atrial fibrillation: Secondary | ICD-10-CM | POA: Diagnosis present

## 2018-10-04 DIAGNOSIS — I5032 Chronic diastolic (congestive) heart failure: Secondary | ICD-10-CM | POA: Diagnosis present

## 2018-10-04 DIAGNOSIS — B966 Bacteroides fragilis [B. fragilis] as the cause of diseases classified elsewhere: Secondary | ICD-10-CM | POA: Diagnosis not present

## 2018-10-04 DIAGNOSIS — G8929 Other chronic pain: Secondary | ICD-10-CM | POA: Diagnosis not present

## 2018-10-04 DIAGNOSIS — Z7189 Other specified counseling: Secondary | ICD-10-CM | POA: Diagnosis not present

## 2018-10-04 DIAGNOSIS — Z91048 Other nonmedicinal substance allergy status: Secondary | ICD-10-CM | POA: Diagnosis not present

## 2018-10-04 DIAGNOSIS — J948 Other specified pleural conditions: Secondary | ICD-10-CM | POA: Diagnosis present

## 2018-10-04 DIAGNOSIS — Z96649 Presence of unspecified artificial hip joint: Secondary | ICD-10-CM | POA: Diagnosis not present

## 2018-10-04 DIAGNOSIS — E871 Hypo-osmolality and hyponatremia: Secondary | ICD-10-CM | POA: Diagnosis not present

## 2018-10-04 DIAGNOSIS — Z66 Do not resuscitate: Secondary | ICD-10-CM | POA: Diagnosis not present

## 2018-10-04 DIAGNOSIS — K7460 Unspecified cirrhosis of liver: Secondary | ICD-10-CM | POA: Diagnosis not present

## 2018-10-04 DIAGNOSIS — L539 Erythematous condition, unspecified: Secondary | ICD-10-CM | POA: Diagnosis not present

## 2018-10-04 DIAGNOSIS — Z9884 Bariatric surgery status: Secondary | ICD-10-CM | POA: Diagnosis not present

## 2018-10-04 DIAGNOSIS — G4700 Insomnia, unspecified: Secondary | ICD-10-CM | POA: Diagnosis present

## 2018-10-04 DIAGNOSIS — R5381 Other malaise: Secondary | ICD-10-CM | POA: Diagnosis not present

## 2018-10-04 LAB — BASIC METABOLIC PANEL
Anion gap: 8 (ref 5–15)
BUN: 35 mg/dL — ABNORMAL HIGH (ref 8–23)
CO2: 22 mmol/L (ref 22–32)
Calcium: 9 mg/dL (ref 8.9–10.3)
Chloride: 107 mmol/L (ref 98–111)
Creatinine, Ser: 1 mg/dL (ref 0.61–1.24)
GFR calc Af Amer: >60 mL/min (ref >60)
GFR calc non Af Amer: >60 mL/min (ref >60)
Glucose, Bld: 102 mg/dL — ABNORMAL HIGH (ref 70–99)
Potassium: 4.4 mmol/L (ref 3.5–5.1)
Sodium: 137 mmol/L (ref 135–145)

## 2018-10-04 LAB — CBC
HCT: 34.6 % — ABNORMAL LOW (ref 39.0–52.0)
Hemoglobin: 11 g/dL — ABNORMAL LOW (ref 13.0–17.0)
MCH: 27.9 pg (ref 26.0–34.0)
MCHC: 31.8 g/dL (ref 30.0–36.0)
MCV: 87.8 fL (ref 80.0–100.0)
Platelets: 240 10*3/uL (ref 150–400)
RBC: 3.94 MIL/uL — ABNORMAL LOW (ref 4.22–5.81)
RDW: 17.8 % — ABNORMAL HIGH (ref 11.5–15.5)
WBC: 7.2 10*3/uL (ref 4.0–10.5)
nRBC: 0 % (ref 0.0–0.2)

## 2018-10-04 LAB — GLUCOSE, CAPILLARY: Glucose-Capillary: 92 mg/dL (ref 70–99)

## 2018-10-04 MED ORDER — FUROSEMIDE 40 MG PO TABS
60.0000 mg | ORAL_TABLET | Freq: Every day | ORAL | Status: DC
  Administered 2018-10-04: 60 mg via ORAL
  Filled 2018-10-04: qty 1

## 2018-10-04 MED ORDER — SPIRONOLACTONE 25 MG PO TABS
50.0000 mg | ORAL_TABLET | Freq: Every day | ORAL | Status: DC
  Administered 2018-10-04 – 2018-12-07 (×63): 50 mg via ORAL
  Filled 2018-10-04 (×65): qty 2

## 2018-10-04 NOTE — Consult Note (Signed)
Reason for Consult:Left hip infection Referring Physician: Ronnie Derby  [name] is an 73 y.o. male.  HPI: [name] is well known to orthopedic surgery with a chronically infected left prosthetic hip. He is on long-term suppressive antibiotics. He developed some redness and tenderness over the left hip and was admitted for cellulitis. Exam and CT were reassuring for lack of joint involvement. IM requested an orthopedic evaluation before he was discharged to SNF, likely tomorrow. He notes that the hip feels better and is not bothering him lying in bed. No recent discharge either.  Past Medical History:  Diagnosis Date  . Anginal pain ([place]) 2006   evaluated by cardio  . Arthritis    knees,feet,shoulders,elbows.hands  . Constipation   . Dyspnea    with exertion   . Dysrhythmia    Atrial Flutter- 2006- corredted itself  . Family history of adverse reaction to anesthesia    mother- with novocaine went into shock  . GERD (gastroesophageal reflux disease)   . Headache   . Hemorrhoids   . History of blood transfusion   . Hypertension   . Insomnia   . Sleep apnea    cpap  . 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 APPENDECTOMY    . ARTERY BIOPSY Left 12/30/2017   Procedure: BIOPSY TEMPORAL ARTERY;  Surgeon: Fanny Skates, MD;  Location: [facility];  Service: General;  Laterality: Left;  . CHOLECYSTECTOMY    . COLONOSCOPY W/ POLYPECTOMY    . ESOPHAGOGASTRODUODENOSCOPY (EGD) WITH PROPOFOL  07/06/2012   Procedure: ESOPHAGOGASTRODUODENOSCOPY (EGD) WITH PROPOFOL;  Surgeon: Jeryl Columbia, MD;  Location: [facility];  Service: Endoscopy;  Laterality: N/A;  . ESOPHAGOSCOPY    . EYE SURGERY     left eye- muscle repair  . HERNIA REPAIR     ventral hernia  . INCISION AND DRAINAGE HIP Left 03/17/2018   Procedure: IRRIGATION AND DEBRIDEMENT LEFT THIGH WOUND;  Surgeon: Gaynelle Arabian, MD;  Location: [facility];  Service: Orthopedics;  Laterality:  Left;  . JOINT REPLACEMENT     bilateral hips.  Right broke and had to be re placed again  . MASS EXCISION N/A 03/02/2015   Procedure: EXCISION ABDOMINAL WALL MASS;  Surgeon: Alphonsa Overall, MD;  Location: [facility];  Service: General;  Laterality: N/A;  . TOTAL HIP REVISION Left 02/17/2018   Procedure: Left total hip arthroplasty revision;  Surgeon: Gaynelle Arabian, MD;  Location: [facility];  Service: Orthopedics;  Laterality: Left;  Marland Kitchen VERTICAL BANDED GASTROPLASTY      Family History  Problem Relation Age of Onset  . Cancer Mother   . Alcohol abuse Father     Social History:  reports that he quit smoking about 34 years ago. His smoking use included cigarettes. He started smoking about 52 years ago. He has a 20.00 pack-year smoking history. He has never used smokeless tobacco. He reports current alcohol use. He reports that he does not use drugs.  Allergies:  Allergies  Allergen Reactions  . Bee Venom Swelling  . Nickel Rash    Medications: I have reviewed the patient's current medications.  Results for orders placed or performed during the hospital encounter of 10/01/18 (from the past 48 hour(s))  Potassium     Status: None   Collection Time: 10/02/18  1:00 PM  Result Value Ref Range   Potassium 5.1 3.5 - 5.1 mmol/L    Comment: Performed at [facility], [address] [place], [place] [address]  Potassium     Status: None   Collection Time: 10/02/18  5:55 PM  Result Value Ref Range   Potassium 5.0 3.5 - 5.1 mmol/L    Comment: Performed at [facility], [place] [address], [place], [place] [address]  Potassium     Status: Abnormal   Collection Time: 10/02/18  9:32 PM  Result Value Ref Range   Potassium 5.3 (H) 3.5 - 5.1 mmol/L    Comment: Performed at [facility], [place] [address] [place], [place] [address]  Potassium     Status: Abnormal   Collection Time: 10/03/18  1:00 AM  Result Value Ref Range   Potassium 5.3 (H) 3.5 - 5.1 mmol/L    Comment: Performed at [facility], [place] [address], [place], [place] [address]  CBC     Status: Abnormal   Collection Time: 10/03/18  5:31 AM  Result Value Ref Range   WBC 6.6 4.0 - 10.5 K/uL   RBC 4.00 (L) 4.22 - 5.81 MIL/uL   Hemoglobin 11.0 (L) 13.0 - 17.0 g/dL   HCT 34.9 (L) 39.0 - 52.0 %   MCV 87.3 80.0 - 100.0 fL   MCH 27.5 26.0 - 34.0 pg   MCHC 31.5 30.0 - 36.0 g/dL   RDW 17.5 (H) 11.5 - 15.5 %   Platelets 224 150 - 400 K/uL   nRBC 0.0 0.0 - 0.2 %    Comment: Performed at [facility], [place] [address], [place], [place] [address]  Basic metabolic panel     Status: Abnormal   Collection Time: 10/03/18  5:31 AM  Result Value Ref Range   Sodium 135 135 - 145 mmol/L   Potassium 5.2 (H) 3.5 - 5.1 mmol/L   Chloride 106 98 - 111 mmol/L   CO2 21 (L) 22 - 32 mmol/L   Glucose, Bld 105 (H) 70 - 99 mg/dL   BUN 40 (H) 8 - 23 mg/dL   Creatinine, Ser 1.21 0.61 - 1.24 mg/dL   Calcium 9.1 8.9 - 10.3 mg/dL   GFR calc non Af Amer 59 (L) >60 mL/min   GFR calc Af Amer >60 >60 mL/min   Anion gap 8 5 - 15    Comment: Performed at [facility] [address] [place], [place] [address]  Glucose, capillary     Status: None   Collection Time: 10/03/18  7:47 AM  Result Value Ref Range   Glucose-Capillary 96 70 - 99 mg/dL  Potassium     Status: None   Collection Time: 10/03/18  9:12 AM  Result Value Ref Range   Potassium 4.6 3.5 - 5.1 mmol/L    Comment: Performed at [facility], [place] [address], [place], [place] [address]  Basic metabolic panel     Status: Abnormal   Collection Time: 10/04/18  5:09 AM  Result Value Ref Range   Sodium 137 135 - 145 mmol/L   Potassium 4.4 3.5 - 5.1 mmol/L   Chloride 107 98 - 111 mmol/L   CO2 22 22 - 32 mmol/L   Glucose, Bld 102 (H) 70 - 99 mg/dL   BUN 35 (H) 8 - 23 mg/dL   Creatinine, Ser 1.00 0.61 - 1.24 mg/dL   Calcium 9.0 8.9 - 10.3 mg/dL   GFR calc non Af Amer >60 >60 mL/min   GFR calc Af Amer >60 >60 mL/min   Anion gap 8 5 -  15    Comment: Performed at  [facility], [place] [address] [place] , [place] [address]  CBC     Status: Abnormal   Collection Time: 10/04/18  5:09 AM  Result Value Ref Range   WBC 7.2 4.0 - 10.5 K/uL   RBC 3.94 (L) 4.22 - 5.81 MIL/uL   Hemoglobin 11.0 (L) 13.0 - 17.0 g/dL   HCT 34.6 (L) 39.0 - 52.0 %   MCV 87.8 80.0 - 100.0 fL   MCH 27.9 26.0 - 34.0 pg   MCHC 31.8 30.0 - 36.0 g/dL   RDW 17.8 (H) 11.5 - 15.5 %   Platelets 240 150 - 400 K/uL   nRBC 0.0 0.0 - 0.2 %    Comment: Performed at [facility], [place] [address], [place], [place] [address]  Glucose, capillary     Status: None   Collection Time: 10/04/18  8:05 AM  Result Value Ref Range   Glucose-Capillary 92 70 - 99 mg/dL    No results found.  Review of Systems  Constitutional: Negative for weight loss.  HENT: Negative for ear discharge, ear pain, hearing loss and tinnitus.   Eyes: Negative for blurred vision, double vision, photophobia and pain.  Respiratory: Negative for cough, sputum production and shortness of breath.   Cardiovascular: Negative for chest pain.  Gastrointestinal: Negative for abdominal pain, nausea and vomiting.  Genitourinary: Negative for dysuria, flank pain, frequency and urgency.  Musculoskeletal: Positive for joint pain (Left hip). Negative for back pain, falls, myalgias and neck pain.  Neurological: Negative for dizziness, tingling, sensory change, focal weakness, loss of consciousness and headaches.  Endo/Heme/Allergies: Does not bruise/bleed easily.  Psychiatric/Behavioral: Negative for depression, memory loss and substance abuse. The patient is not nervous/anxious.    Blood pressure 106/68, pulse (!) 102, temperature 98.7 F (37.1 C), resp. rate 18, height 5\' 8"  (1.727 m), weight 135.2 kg, SpO2 93 %. Physical Exam  Constitutional: He appears well-developed and well-nourished. No distress.  HENT:  Head: Normocephalic and atraumatic.  Eyes: Conjunctivae are normal. Right eye exhibits no discharge. Left eye  exhibits no discharge. No scleral icterus.  Neck: Normal range of motion.  Cardiovascular: Normal rate and regular rhythm.  Respiratory: Effort normal. No respiratory distress.  Musculoskeletal:     Comments: LLE No traumatic wounds, ecchymosis, or rash  Mild erythema hip, no TTP, AROM/PROM mild soreness but feels good to move  No knee or ankle effusion  Knee stable to varus/ valgus and anterior/posterior stress  Sens DPN, SPN, TN intact  Motor EHL, ext, flex, evers 5/5  DP 1+, PT 1+, No significant edema  Neurological: He is alert.  Skin: Skin is warm and dry. He is not diaphoretic.  Psychiatric: He has a normal mood and affect. His behavior is normal.    Assessment/Plan: Left hip cellulitis -- It appears to be resolving but not completely eradicated yet. However, no s/sx of joint involvement. [place] for discharge to SNF at earliest opportunity from orthopedic perspective. He should f/u with Dr. Maureen Ralphs late this week or at his earliest opportunity.    Lisette Abu, PA-C Orthopedic Surgery [phone] 10/04/2018, 12:01 PM

## 2018-10-04 NOTE — Progress Notes (Signed)
PCCM: Agree. Multiple medical issues. Thanks for seeing him.  [facility] 10/04/2018 8:50 AM

## 2018-10-04 NOTE — NC FL2 (Signed)
[place] LEVEL OF CARE SCREENING TOOL     IDENTIFICATION  Patient Name: [name] Birthdate: Jul 01, 1946 Sex: male Admission Date (Current Location): 10/01/2018  [facility] and [place] Number:  [employer] and Address:  The . [facility], [place] [address] [place], [place] [address]      Provider Number: [id number]  Attending Physician Name and Address:  Thurnell Lose, MD  Relative Name and Phone Number:  [name] daughter, [phone]    Current Level of Care: Hospital Recommended Level of Care: [place] Prior Approval Number:    Date Approved/Denied:   PASRR Number: [id number]  Discharge Plan: SNF    Current Diagnoses: Patient Active Problem List   Diagnosis Date Noted  . Hyperkalemia 10/01/2018  . Other cirrhosis of liver ([place]) 10/01/2018  . Chronic back pain 10/01/2018  . Cellulitis of left hip   . 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 Physical debility 09/04/2018  . Pleural effusion, bilateral   . Hypertensive urgency 08/17/2018  . Pleural effusion on left 08/17/2018  . Chronic diastolic CHF (congestive heart failure) ([place]) 08/17/2018  . OSA on CPAP 08/17/2018  . Unspecified atrial fibrillation ([place])   . Delirium   . Depression   . OSA (obstructive sleep apnea)   . Obesity, Class III, BMI 40-49.9 (morbid obesity) ([place])   . Atrial fibrillation with RVR ([place]) 04/16/2018  . Morbid obesity with BMI of 50.0-59.9, adult ([place]) 04/16/2018  . AKI (acute kidney injury) ([place]) 04/16/2018  . Visual hallucination 04/16/2018  . SIRS (systemic inflammatory response syndrome) ([place]) 04/16/2018  . Anemia of chronic disease 04/16/2018  . Left hip postoperative wound infection 03/15/2018  . 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 Femur fracture, left ([place]) 02/15/2018  . Anemia 02/15/2018  . Femur fracture ([place]) 02/15/2018  . 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 SOB  (shortness of breath) 03/31/2014  . Hypertension   . Hyponatremia 03/18/2012  . 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[place]) 03/17/2012    Orientation RESPIRATION BLADDER Height & Weight     Self, Time, Situation, Place  Normal, Other (Comment)(Has home cpap) Continent Weight: 135.2 kg Height:  5\' 8"  (172.7 cm)  BEHAVIORAL SYMPTOMS/MOOD NEUROLOGICAL BOWEL NUTRITION STATUS      Continent Diet(Please see DC Summary)  AMBULATORY STATUS COMMUNICATION OF NEEDS Skin   Extensive Assist Verbally Surgical wounds(Closed incision on hip)                       Personal Care Assistance Level of Assistance  Bathing, Feeding, Dressing Bathing Assistance: Limited assistance Feeding assistance: Independent Dressing Assistance: Limited assistance     Functional Limitations Info  Sight, Hearing, Speech Sight Info: Adequate Hearing Info: Impaired Speech Info: Adequate    SPECIAL CARE FACTORS FREQUENCY  PT (By licensed PT), OT (By licensed OT)     PT Frequency: 5x/week OT Frequency: 3x/week            Contractures Contractures Info: Not present    Additional Factors Info  Code Status, Allergies Code Status Info: Full Allergies Info: Bee Venom, Nickel           Current Medications (10/04/2018):  This is the current hospital active medication list Current Facility-Administered Medications  Medication Dose Route Frequency Provider Last Rate Last Dose  . acetaminophen (TYLENOL) tablet 650 mg  650 mg Oral Q6H PRN Thurnell Lose, MD      .  albuterol (PROVENTIL) (2.5 MG/3ML) 0.083% nebulizer solution 2.5 mg  2.5 mg Inhalation Q6H PRN Opyd, Ilene Qua, MD      . apixaban (ELIQUIS) tablet 5 mg  5 mg Oral BID Opyd, Ilene Qua, MD   5 mg at 10/04/18 0953  . cycloSPORINE (RESTASIS) 0.05 % ophthalmic emulsion 1 drop  1 drop Both Eyes BID Opyd, Ilene Qua, MD   1 drop at 10/04/18 0951  . diltiazem (CARDIZEM CD) 24 hr capsule 240 mg  240 mg Oral Daily Thurnell Lose, MD   240 mg at  10/04/18 2979  . diltiazem (CARDIZEM) injection 10 mg  10 mg Intravenous Q6H PRN Thurnell Lose, MD      . escitalopram (LEXAPRO) tablet 10 mg  10 mg Oral Daily Opyd, Ilene Qua, MD   10 mg at 10/04/18 8921  . furosemide (LASIX) tablet 60 mg  60 mg Oral Daily Thurnell Lose, MD   60 mg at 10/04/18 1305  . gabapentin (NEURONTIN) capsule 100 mg  100 mg Oral TID Vianne Bulls, MD   100 mg at 10/04/18 0952  . hydrOXYzine (ATARAX/VISTARIL) tablet 25 mg  25 mg Oral TID PRN Vianne Bulls, MD   25 mg at 10/04/18 0521  . lidocaine (LIDODERM) 5 % 1 patch  1 patch Transdermal Q24H Opyd, Ilene Qua, MD   1 patch at 10/04/18 0951  . Melatonin TABS 6 mg  6 mg Oral QHS Opyd, Ilene Qua, MD   6 mg at 10/03/18 2224  . metoprolol tartrate (LOPRESSOR) tablet 25 mg  25 mg Oral BID Vianne Bulls, MD   25 mg at 10/04/18 1941  . metroNIDAZOLE (FLAGYL) tablet 500 mg  500 mg Oral Q8H Thurnell Lose, MD   500 mg at 10/04/18 1305  . multivitamin with minerals tablet 1 tablet  1 tablet Oral Daily Jonetta Osgood, MD   1 tablet at 10/04/18 [phone]  . ondansetron (ZOFRAN) injection 4 mg  4 mg Intravenous Q6H PRN Opyd, Ilene Qua, MD      . oxyCODONE (Oxy IR/ROXICODONE) immediate release tablet 5 mg  5 mg Oral Q6H PRN Opyd, Ilene Qua, MD   5 mg at 10/04/18 1305  . pantoprazole (PROTONIX) EC tablet 40 mg  40 mg Oral Daily Opyd, Ilene Qua, MD   40 mg at 10/04/18 0953  . polyethylene glycol (MIRALAX / GLYCOLAX) packet 17 g  17 g Oral Daily PRN Opyd, Ilene Qua, MD      . protein supplement (ENSURE MAX) liquid  11 oz Oral BID Jonetta Osgood, MD   11 oz at 10/04/18 0951  . spironolactone (ALDACTONE) tablet 50 mg  50 mg Oral Daily Thurnell Lose, MD   50 mg at 10/04/18 1305  . thiamine (VITAMIN B-1) tablet 100 mg  100 mg Oral Daily Opyd, Ilene Qua, MD   100 mg at 10/04/18 0952  . vancomycin (VANCOCIN) 2,000 mg in sodium chloride 0.9 % 500 mL IVPB  2,000 mg Intravenous Q36H Opyd, Ilene Qua, MD 250 mL/hr at 10/03/18  1510 2,000 mg at 10/03/18 1510     Discharge Medications: Please see discharge summary for a list of discharge medications.  Relevant Imaging Results:  Relevant Lab Results:   Additional Information SS# [id number]. Weight 360 lb. Patient uses CPAP   Benard Halsted, LCSW

## 2018-10-04 NOTE — Progress Notes (Signed)
PROGRESS NOTE        PATIENT DETAILS Name: [name] Age: 73 y.o. Sex: male Date of Birth: 1945-12-16 Admit Date: 10/01/2018 Admitting Physician Vianne Bulls, MD [id number], Hollice Espy, MD  Brief Narrative: Patient is a 73 y.o. male with history of A. 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  Found to have a soft tissue infection of the left hip area, acute kidney injury and hyperkalemia.  Subsequently admitted to the hospitalist service.  See below for further details  Subjective:  Patient in bed, appears comfortable, denies any headache, no fever, no chest pain or pressure, no shortness of breath , no abdominal pain. No focal weakness.    Assessment/Plan:  AKI due to ATN from Infection with hyperkalemia: He was also taking diuretics including Aldactone, offending medication was held, he was hydrated, also received Kayexalate for hyperkalemia protocol.  ARF and hyperkalemia have resolved.  Chesley reintroduce diuretics on 10/04/2018 and monitor renal function.  Left hip soft tissue infection: Soft tissue CT scan of the left hip does not show any joint involvement.  He has been placed on IV vancomycin along with home medication Flagyl which he takes for chronic left hip prosthetic joint infection.  Soft tissue infection has improved.  Continue to monitor cultures along with CBC and temperature curve.  No signs of sepsis currently.  Have requested orthopedics to evaluate one time as well if  all good likely to be transitioned to oral doxycycline on 10/05/2018 and discharge to SNF thereafter.  History of left total hip arthroplasty with subsequent infection with Bacteroides fragilis: His orthopedic surgeon here in [place] is Dr. Henrietta Dine however was evaluated by orthopedics and ID and [facility] once he had left hip infection.  He is on Flagyl-with planned end date November 19, 2018.  Orthopedics consulted on 10/04/2018 to give their opinion.  Recurrent pleural effusion: Felt to be secondary to hepatic hydrothorax-recent chest x-ray shows a small left pleural effusion seen on x-ray.  Has had extensive work-up including multiple thoracocentesis in the past.  Resume diuretics and monitor.  Cryptogenic cirrhosis: Prior hepatitis serology done earlier this month was negative-agree with plans outlined in prior discharge summary for outpatient follow-up.  Recheck renal function tomorrow-if creatinine is still stable-suspect diuretics can be resumed.  Volume status is currently stable.  Chronic atrial fibrillation with [place] vas 2 score of at least 2: Rate controlled with Cardizem and metoprolol, continue Eliquis.  Cardizem dose has been increased on 10/04/2018 for better rate control.  Chronic back and right shoulder pain: Continue Lidoderm patch, Tylenol and as needed narcotics.  Bipolar disorder: Stable-continue Lexapro, Neurontin  OSA: Continue CPAP nightly  Morbid obesity  Acute on chronic debility/deconditioning: PT requested, may require SNF.    DVT Prophylaxis: Full dose anticoagulation with Eliquis  Code Status: Full code  Family Communication: None at bedside  Disposition Plan: Remain inpatient-but will plan on Home health vs SNF on discharge  Antimicrobial agents:  Anti-infectives (From admission, onward)   Start     Dose/Rate Route Frequency Ordered Stop   10/03/18 1800  metroNIDAZOLE (FLAGYL) tablet 500 mg     500 mg Oral Every 8 hours 10/03/18 1054      10/03/18 0700  vancomycin (VANCOCIN) 2,000 mg in sodium chloride 0.9 % 500 mL IVPB     2,000 mg 250 mL/hr over 120 Minutes Intravenous Every 36 hours 10/01/18 1942     10/02/18 0200  ceFEPIme (MAXIPIME) 2 g in sodium chloride 0.9 % 100 mL IVPB  Status:  Discontinued     2 g 200 mL/hr over 30 Minutes Intravenous Every 8 hours 10/01/18 1942 10/02/18 1008   10/01/18 1830  vancomycin (VANCOCIN) 2,500 mg in sodium chloride 0.9 % 500 mL IVPB     2,500 mg 250 mL/hr over 120 Minutes Intravenous STAT 10/01/18 1825 10/02/18 0126   10/01/18 1800  ceFEPIme (MAXIPIME) 2 g in sodium chloride 0.9 % 100 mL IVPB     2 g 200 mL/hr over 30 Minutes Intravenous  Once 10/01/18 1747 10/01/18 1903   10/01/18 1800  metroNIDAZOLE (FLAGYL) IVPB 500 mg  Status:  Discontinued     500 mg 100 mL/hr over 60 Minutes Intravenous Every 8 hours 10/01/18 1747 10/03/18 1054   10/01/18 1800  vancomycin (VANCOCIN) IVPB 1000 mg/200 mL premix  Status:  Discontinued     1,000 mg 200 mL/hr over 60 Minutes Intravenous  Once 10/01/18 1747 10/01/18 1825      Procedures: None  CONSULTS:  None  Time spent: 25- minutes-Greater than 50% of this time was spent in counseling, explanation of diagnosis, planning of further management, and coordination of care.  MEDICATIONS: Scheduled Meds: . apixaban  5 mg Oral BID  . cycloSPORINE  1 drop Both Eyes BID  . diltiazem  240 mg Oral Daily  . escitalopram  10 mg Oral Daily  . gabapentin  100 mg Oral TID  . lidocaine  1 patch Transdermal Q24H  . Melatonin  6 mg Oral QHS  . metoprolol tartrate  25 mg Oral BID  . metroNIDAZOLE  500 mg Oral Q8H  . multivitamin with minerals  1 tablet Oral Daily  . pantoprazole  40 mg Oral Daily  . ENSURE MAX PROTEIN  11 oz Oral BID  . thiamine  100 mg Oral Daily   Continuous Infusions: . vancomycin 2,000 mg (10/03/18 1510)   PRN Meds:.acetaminophen, albuterol, diltiazem, hydrOXYzine, [DISCONTINUED] ondansetron **OR** ondansetron (ZOFRAN) IV,  oxyCODONE, polyethylene glycol   PHYSICAL EXAM: Vital signs: Vitals:   10/03/18 0453 10/03/18 1530 10/03/18 2110 10/04/18 0951  BP: 106/66 104/74 98/71 106/68  Pulse: (!) 105 (!) 102 (!) 115 (!) 102  Resp: 18 20 18    Temp: 97.8 F (36.6 C) 97.7 F (36.5 C) 98.7 F (37.1 C)   TempSrc:  Oral    SpO2: 99% 95% 93%   Weight:      Height:       Filed Weights   10/01/18 1725  Weight: 135.2 kg   Body mass index is 45.31 kg/m.   Exam  Awake Alert, Oriented X 3, No new F.N deficits, Normal affect [place].AT,PERRAL Supple Neck,No JVD, No cervical lymphadenopathy appriciated.  Symmetrical Chest wall movement, Good air movement bilaterally, CTAB RRR,No Gallops, Rubs or new Murmurs, No Parasternal Heave +ve B.Sounds, Abd Soft, No tenderness, No organomegaly appriciated, No rebound - guarding or rigidity. No Cyanosis, Clubbing or edema, L. Hip cellulitis as below  I have personally reviewed following labs and imaging studies  LABORATORY DATA: CBC: Recent Labs  Lab 10/01/18 1835 10/02/18 0112 10/03/18 0531 10/04/18 0509  WBC 11.6* 9.1 6.6 7.2  NEUTROABS 9.4* 6.4  --   --   HGB 13.1 11.3* 11.0* 11.0*  HCT 42.9 35.5* 34.9* 34.6*  MCV 87.9 85.3 87.3 87.8  PLT 248 228 224 355    Basic Metabolic Panel: Recent Labs  Lab 10/01/18 1835  10/02/18 0524  10/02/18 2132 10/03/18 0100 10/03/18 0531 10/03/18 0912 10/04/18 0509  NA 133*  --  132*  --   --   --  135  --  137  K 6.2*   < > 4.6   < > 5.3* 5.3* 5.2* 4.6 4.4  CL 99  --  104  --   --   --  106  --  107  CO2 23  --  20*  --   --   --  21*  --  22  GLUCOSE 103*  --  104*  --   --   --  105*  --  102*  BUN 50*  --  44*  --   --   --  40*  --  35*  CREATININE 1.50*  --  1.29*  --   --   --  1.21  --  1.00  CALCIUM 9.9  --  9.5  --   --   --  9.1  --  9.0   < > = values in this interval not displayed.    GFR: Estimated Creatinine Clearance: 89.8 mL/min (by C-G formula based on SCr of 1 mg/dL).  Liver Function  Tests: Recent Labs  Lab 10/01/18 1835  AST 19  ALT 11  ALKPHOS 56  BILITOT 0.5  PROT 7.4  ALBUMIN 3.1*   No results for input(s): LIPASE, AMYLASE in the last 168 hours. No results for input(s): AMMONIA in the last 168 hours.  Coagulation Profile: No results for input(s): INR, PROTIME in the last 168 hours.  Cardiac Enzymes: No results for input(s): CKTOTAL, CKMB, CKMBINDEX, TROPONINI in the last 168 hours.  BNP (last 3 results) No results for input(s): PROBNP in the last 8760 hours.  HbA1C: No results for input(s): HGBA1C in the last 72 hours.  CBG: Recent Labs  Lab 10/03/18 0747 10/04/18 0805  GLUCAP 96 92    Lipid Profile: No results for input(s): CHOL, HDL, LDLCALC, TRIG, CHOLHDL, LDLDIRECT in the last 72 hours.  Thyroid Function Tests: No results for input(s): TSH, T4TOTAL, FREET4, T3FREE, THYROIDAB in the last 72 hours.  Anemia Panel: No results for input(s): VITAMINB12, FOLATE, FERRITIN, TIBC, IRON, RETICCTPCT in the last 72 hours.  Urine analysis:    Component Value Date/Time   COLORURINE YELLOW 10/01/2018 1822   APPEARANCEUR CLEAR 10/01/2018 1822   APPEARANCEUR Clear 01/18/2018 1539   LABSPEC 1.010 10/01/2018 1822   PHURINE 5.0 10/01/2018 1822   GLUCOSEU NEGATIVE 10/01/2018 1822   HGBUR NEGATIVE 10/01/2018 1822   BILIRUBINUR NEGATIVE 10/01/2018 1822   BILIRUBINUR Negative 01/18/2018 1539   KETONESUR NEGATIVE 10/01/2018 1822   PROTEINUR NEGATIVE 10/01/2018 1822   UROBILINOGEN 0.2 09/06/2012 0213   NITRITE NEGATIVE 10/01/2018 1822   LEUKOCYTESUR NEGATIVE 10/01/2018 1822   LEUKOCYTESUR Negative 01/18/2018 1539    Sepsis Labs: Lactic Acid, Venous    Component Value Date/Time   LATICACIDVEN 1.7 10/01/2018 1833    MICROBIOLOGY: Recent Results (from the past 240 hour(s))  Blood Culture (routine x 2)  Status: None (Preliminary result)   Collection Time: 10/01/18  6:15 PM  Result Value Ref Range Status   Specimen Description BLOOD LEFT HAND   Final   Special Requests   Final    BOTTLES DRAWN AEROBIC AND ANAEROBIC Blood Culture adequate volume   Culture   Final    NO GROWTH 3 DAYS Performed at [facility], [address], [place], [place] [address]    Report Status PENDING  Incomplete  Blood Culture (routine x 2)     Status: None (Preliminary result)   Collection Time: 10/01/18  6:17 PM  Result Value Ref Range Status   Specimen Description BLOOD RIGHT ANTECUBITAL  Final   Special Requests   Final    BOTTLES DRAWN AEROBIC AND ANAEROBIC Blood Culture adequate volume   Culture   Final    NO GROWTH 3 DAYS Performed at [facility], [place] [address] [place], [place] [address]    Report Status PENDING  Incomplete  Urine culture     Status: Abnormal   Collection Time: 10/01/18  6:24 PM  Result Value Ref Range Status   Specimen Description URINE, CLEAN CATCH  Final   Special Requests   Final    NONE Performed at [facility], [place] [address], [place], [place] [address]    Culture 20,000 COLONIES/mL PROTEUS MIRABILIS (A)  Final   Report Status 10/03/2018 FINAL  Final   Organism ID, Bacteria PROTEUS MIRABILIS (A)  Final      Susceptibility   Proteus mirabilis - MIC*    AMPICILLIN <=2 SENSITIVE Sensitive     CEFAZOLIN <=4 SENSITIVE Sensitive     CEFTRIAXONE <=1 SENSITIVE Sensitive     CIPROFLOXACIN <=0.25 SENSITIVE Sensitive     GENTAMICIN <=1 SENSITIVE Sensitive     IMIPENEM 8 INTERMEDIATE Intermediate     NITROFURANTOIN 128 RESISTANT Resistant     TRIMETH/SULFA <=20 SENSITIVE Sensitive     AMPICILLIN/SULBACTAM <=2 SENSITIVE Sensitive     PIP/TAZO <=4 SENSITIVE Sensitive     * 20,000 COLONIES/mL PROTEUS MIRABILIS    RADIOLOGY STUDIES/RESULTS: Dg Chest 1 View  Result Date: 10/01/2018 CLINICAL DATA:  Follow-up pneumothorax. EXAM: CHEST  1 VIEW COMPARISON:  09/09/2018 FINDINGS: Mild bilateral interstitial thickening. Small bilateral pleural effusions. Left lateral pleural thickening with a subtle  lucency which may reflect a small loculated pneumothorax versus artifact. Right infrahilar hazy airspace disease likely reflecting atelectasis. Stable cardiomediastinal silhouette. No aggressive osseous lesion. Mild osteoarthritis of bilateral glenohumeral joints. IMPRESSION: Left lateral pleural thickening with a subtle lucency which may reflect a small loculated pneumothorax versus artifact. Recommend follow-up chest x-ray for re-evaluation. Bilateral interstitial thickening and small bilateral pleural effusions concerning for mild pulmonary edema. Electronically Signed   By: Kathreen Devoid   On: 10/01/2018 15:38   Dg Chest 2 View  Result Date: 09/09/2018 CLINICAL DATA:  BILATERAL pleural effusions, shortness of breath, weakness, history hypertension, CHF, former smoker EXAM: CHEST - 2 VIEW COMPARISON:  09/08/2018 FINDINGS: Enlargement of cardiac silhouette. Atherosclerotic calcification aorta. Mediastinal contours and pulmonary vascularity normal. Bibasilar effusions and atelectasis. Upper lungs clear. No pleural effusion or pneumothorax. Degenerative disc disease changes thoracic spine as well as BILATERAL glenohumeral degenerative changes. IMPRESSION: Enlargement of cardiac silhouette with bibasilar pleural effusions and atelectasis. Electronically Signed   By: Lavonia Dana M.D.   On: 09/09/2018 15:08   Dg Chest 2 View  Result Date: 09/08/2018 CLINICAL DATA:  Recurrent pleural effusion on the right EXAM: CHEST - 2  VIEW COMPARISON:  Three days ago FINDINGS: Unchanged small to moderate right pleural effusion that appears to be layering. Unchanged pleural thickening on the left. Chronic cardiomegaly. Stable interstitial coarsening. IMPRESSION: 1. Unchanged small to moderate right pleural effusion. 2. Stable pleural thickening on the left. Electronically Signed   By: Monte Fantasia M.D.   On: 09/08/2018 08:38   Dg Chest 2 View  Result Date: 09/05/2018 CLINICAL DATA:  One day post thoracentesis. EXAM: CHEST -  2 VIEW COMPARISON:  09/04/2018 FINDINGS: Cardiac silhouette is mildly enlarged. There is interstitial thickening that appears increased compared to the prior. Left pleural effusion is stable. There has been increase in right pleural fluid. No pneumothorax. Skeletal structures are grossly intact. IMPRESSION: 1. Worsened lung aeration compared to the previous day's study. Findings are consistent with interstitial pulmonary edema, likely due to congestive heart failure, with pleural effusions. Right pleural effusion has increased from the previous day's exam. 2. No pneumothorax. Electronically Signed   By: Lajean Manes M.D.   On: 09/05/2018 16:01   Dg Shoulder Right  Result Date: 09/05/2018 CLINICAL DATA:  Chronic right shoulder pain. EXAM: RIGHT SHOULDER - 2+ VIEW COMPARISON:  Plain films right shoulder 05/17/2018. FINDINGS: The patient has bone-on-bone glenohumeral osteoarthritis with small subchondral cysts, sclerosis and an osteophyte off the humeral head. Loose bodies in the subscapularis recess noted. Moderate acromioclavicular osteoarthritis is seen. No acute bony abnormality. IMPRESSION: No acute finding. 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 Electronically Signed   By: Inge Rise M.D.   On: 09/05/2018 16:02   Ct Hip Left W Contrast  Result Date: 10/01/2018 CLINICAL DATA:  Left hip pain EXAM: CT OF THE LOWER LEFT EXTREMITY WITH CONTRAST TECHNIQUE: Multidetector CT imaging of the lower left extremity was performed according to the standard protocol following intravenous contrast administration. COMPARISON:  09/03/2018 CONTRAST:  13mL OMNIPAQUE IOHEXOL 300 MG/ML  SOLN FINDINGS: Bones/Joint/Cartilage An uncemented left total hip arthroplasty is identified with the tip traversing a left femoral diaphyseal fracture that is incompletely healed. Subtle residual fracture lucencies are suggested on the coronal reformats, series 6/46 through 48 for instance. There is callus  formation developing about the fracture, series 3/143. Slight residual varus configuration about the fracture is seen of the femur. No new/acute appearing fracture or bone destruction is seen. With reference to the hip joint, no significant joint effusion or abnormal lucencies to suggest loosening. The prosthetic femoral head is seated within the acetabular component without evidence of asymmetric lining wear. No soft tissue mass or mineralization. No hematoma or abnormal fluid collections. Osteoarthritis of the included SI joints and pubic symphysis. No diastasis. Ligaments Suboptimally assessed by CT. Muscles and Tendons Negative for muscle edema, hematoma or definite tear. Soft tissues No soft tissue mass, mineralization or fluid collections. Postop scarring along the lateral aspect of the hip. IMPRESSION: 1. An uncemented left total hip arthroplasty is identified with the tip traversing a left femoral diaphyseal fracture that is incompletely healed. Subtle residual fracture lucencies are suggested on the coronal reformats, series 6/46 through 48. Slight residual varus configuration of the femur about the fracture is noted. 2. No new/acute appearing fracture or bone destruction is seen. 3. No significant joint effusion to suggest septic arthritis though this is of clinical concern, percutaneous sampling of the joint fluid could be attempted. No definite evidence of hardware loosening however. Electronically Signed   By: Ashley Royalty M.D.   On: 10/01/2018 20:08   US Abdomen Complete  Result Date: 09/06/2018 CLINICAL DATA:  Cirrhosis, status post cholecystectomy EXAM: ABDOMEN ULTRASOUND COMPLETE COMPARISON:  CT abdomen/pelvis dated 01/09/2015 FINDINGS: Gallbladder: Surgically absent. Common bile duct: Diameter: 20 mm, chronic Liver: Coarse pack echotexture with nodular hepatic contour, reflecting cirrhosis. Mild intrahepatic ductal dilatation in the left hepatic lobe, chronic. Portal vein is patent on color  Doppler imaging with normal direction of blood flow towards the liver. IVC: No abnormality visualized. Pancreas: Not visualized due to overlying bowel gas. Spleen: Size and appearance within normal limits. Right Kidney: Length: 11.2 cm. Poorly visualized. No mass or hydronephrosis. Left Kidney: Length: 10.8 cm.  No mass or hydronephrosis. Abdominal aorta: No aneurysm visualized. Other findings: Right pleural effusion. IMPRESSION: Cirrhosis.  No focal hepatic lesion is seen. Status post cholecystectomy. Dilated common duct, measuring 20 mm, chronic. Mild intrahepatic ductal dilatation, chronic. Right pleural effusion. Electronically Signed   By: Julian Hy M.D.   On: 09/06/2018 18:39   Dg Chest Port 1 View  Result Date: 10/01/2018 CLINICAL DATA:  LEFT hip replacement 6 months ago, sepsis, question LEFT leg/hip infection EXAM: PORTABLE CHEST 1 VIEW COMPARISON:  Portable exam 1756 hours compared to 10/01/2018 FINDINGS: RIGHT costophrenic angle excluded. Enlargement of cardiac silhouette. Mediastinal contours and pulmonary vascularity normal. Atherosclerotic calcification aorta. Small LEFT pleural effusion and basilar atelectasis at lower LEFT chest question partially loculated. No definite infiltrate or pneumothorax. Bones demineralized. IMPRESSION: LEFT pleural effusion and basilar atelectasis, question partially locule at the lower lateral LEFT chest. Electronically Signed   By: Lavonia Dana M.D.   On: 10/01/2018 18:05   Dg Chest Port 1 View  Result Date: 09/04/2018 CLINICAL DATA:  Status post thoracentesis EXAM: PORTABLE CHEST 1 VIEW COMPARISON:  09/03/2018 FINDINGS: Right pleural effusion has resolved after thoracentesis. No pneumothorax. Left pleural thickening and peripheral basilar heterogeneous opacities are stable. Upper normal heart size. IMPRESSION: No pneumothorax post right thoracentesis. Electronically Signed   By: Marybelle Killings M.D.   On: 09/04/2018 10:30   US Thoracentesis Asp Pleural  Space W/img Guide  Result Date: 09/04/2018 INDICATION: Right pleural effusion EXAM: ULTRASOUND GUIDED RIGHT THORACENTESIS MEDICATIONS: None. COMPLICATIONS: None immediate. PROCEDURE: An ultrasound guided thoracentesis was thoroughly discussed with the patient and questions answered. The benefits, risks, alternatives and complications were also discussed. The patient understands and wishes to proceed with the procedure. Written consent was obtained. Ultrasound was performed to localize and mark an adequate pocket of fluid in the right chest. The area was then prepped and draped in the normal sterile fashion. 1% Lidocaine was used for local anesthesia. Under ultrasound guidance a 6 Fr Safe-T-Centesis catheter was introduced. Thoracentesis was performed. The catheter was removed and a dressing applied. FINDINGS: A total of approximately 1.1 L of clear yellow fluid was removed. Samples were sent to the laboratory as requested by the clinical team. IMPRESSION: Successful ultrasound guided right thoracentesis yielding 1.1 L of pleural fluid. Electronically Signed   By: Marybelle Killings M.D.   On: 09/04/2018 10:30     LOS: 0 days   Signature  Lala Lund M.D on 10/04/2018 at 10:02 AM   -  To page go to [email or web address]

## 2018-10-04 NOTE — Clinical Social Work Note (Signed)
Clinical Social Work Assessment  Patient Details  Name: [name] MRN: [id number] Date of Birth: [birth year]  Date of referral:  10/04/18               Reason for consult:  Facility Placement                Permission sought to share information with:  Facility Art therapist granted to share information::  Yes, Verbal Permission Granted  Name::        Agency::  SNFs  Relationship::     Contact Information:     Housing/Transportation Living arrangements for the past 2 months:  [place], [place] of Information:  Patient Patient Interpreter Needed:  None Criminal Activity/Legal Involvement Pertinent to Current Situation/Hospitalization:  No - Comment as needed Significant Relationships:  Spouse Lives with:  Self Do you feel safe going back to the place where you live?  No Need for family participation in patient care:  No (Coment)  Care giving concerns:  CSW received consult for possible SNF placement at time of discharge. CSW spoke with patient regarding PT recommendation of SNF placement at time of discharge. Patient reported that he would like to go to SNF at discharge. CSW spoke with patient regarding Medcare days. CSW explained that patient has not had a 60 day wellness visit from his last SNF stay (where he used all 100 days). Patient stated that he called [facility] Medicare and they told him he only needed a 30 day wellness period. CSW to continue to follow and assist with discharge planning needs.   Social Worker assessment / plan:  CSW spoke with patient concerning possibility of rehab at [facility] before returning home.  Employment status:  Retired Nurse, adult PT Recommendations:  [place] / Referral to community resources:  [place]  Patient/Family's Response to care:  Patient recognizes need for rehab before returning home and is agreeable to a SNF. Patient  reported preference for [facility], since that is where [place] DSS suggested [place]). CSW sent the referral to [facility], but they are uncertain if they can accept patient back. They are checking on his Medicare days. Patient stated that he is willing to pay privately to go to SNF as well. CSW quoted price and patient in agreement. CSW sent referral out. Patient states that he has applied for Medicaid and they are trying to see if he can qualify without selling his house (patient does not want to give up his house).   Patient/Family's Understanding of and Emotional Response to Diagnosis, Current Treatment, and Prognosis:  Patient/family is realistic regarding therapy needs and expressed being hopeful for SNF placement. Patient expressed understanding of CSW role and discharge process as well as medical condition. No questions/concerns about plan or treatment.    Emotional Assessment Appearance:  Appears stated age Attitude/Demeanor/Rapport:  Engaged Affect (typically observed):  Accepting, Appropriate Orientation:  Oriented to Self, Oriented to Place, Oriented to  Time, Oriented to Situation Alcohol / Substance use:  Not Applicable Psych involvement (Current and /or in the community):  No (Comment)  Discharge Needs  Concerns to be addressed:  Care Coordination Readmission within the last 30 days:  Yes Current discharge risk:  Dependent with Mobility Barriers to Discharge:  Continued Medical Work up   [employer], LCSW 10/04/2018, 4:12 PM

## 2018-10-05 LAB — BASIC METABOLIC PANEL
Anion gap: 10 (ref 5–15)
BUN: 33 mg/dL — ABNORMAL HIGH (ref 8–23)
CO2: 22 mmol/L (ref 22–32)
Calcium: 9.1 mg/dL (ref 8.9–10.3)
Chloride: 104 mmol/L (ref 98–111)
Creatinine, Ser: 1.07 mg/dL (ref 0.61–1.24)
GFR calc Af Amer: >60 mL/min (ref >60)
GFR calc non Af Amer: >60 mL/min (ref >60)
Glucose, Bld: 99 mg/dL (ref 70–99)
Potassium: 4.5 mmol/L (ref 3.5–5.1)
Sodium: 136 mmol/L (ref 135–145)

## 2018-10-05 LAB — GLUCOSE, CAPILLARY: Glucose-Capillary: 90 mg/dL (ref 70–99)

## 2018-10-05 LAB — MAGNESIUM: Magnesium: 2.1 mg/dL (ref 1.7–2.4)

## 2018-10-05 MED ORDER — TRAZODONE HCL 50 MG PO TABS
25.0000 mg | ORAL_TABLET | Freq: Every evening | ORAL | Status: DC | PRN
  Administered 2018-10-05 – 2018-11-17 (×36): 25 mg via ORAL
  Filled 2018-10-05 (×38): qty 1

## 2018-10-05 MED ORDER — ALBUMIN HUMAN 25 % IV SOLN
12.5000 g | Freq: Four times a day (QID) | INTRAVENOUS | Status: AC
  Administered 2018-10-05 (×3): 12.5 g via INTRAVENOUS
  Filled 2018-10-05: qty 50
  Filled 2018-10-05: qty 100
  Filled 2018-10-05: qty 50

## 2018-10-05 MED ORDER — FUROSEMIDE 10 MG/ML IJ SOLN
60.0000 mg | Freq: Two times a day (BID) | INTRAMUSCULAR | Status: AC
  Administered 2018-10-05 (×2): 60 mg via INTRAVENOUS
  Filled 2018-10-05 (×2): qty 6

## 2018-10-05 NOTE — Progress Notes (Signed)
Pt is already on home CPAP unit.

## 2018-10-05 NOTE — Progress Notes (Signed)
[facility] liaisons trying to find out if patient has SNF days through Medicare. Patient reports that he does not have enough money to pay privately at [facility].   Percell Locus Beyla Loney LCSW [phone]

## 2018-10-05 NOTE — Progress Notes (Signed)
Physical Therapy Treatment Patient Details Name: [name] MRN: [id number] DOB: Jan 26, 1946 Today's Date: 10/05/2018    History of Present Illness Pt is a 73 year old male with PMHx significant for HTN, afib on Eliquis, OSA, morbid obesity, L THA and hip infection and history of multiple recent prolonged hospitalizations.  Pt presented to ED with generalized weakness, falls, and shortness of breath- work-up in the emergency room showed A. fib heart rate 108 and a recurrent right-sided pleural effusion    PT Comments    Pt progressing gradually towards his physical therapy goals. Performing x2 stand pivots from bed <> chair with up to two person moderate assistance. Pt unable to tolerate sitting in chair with cushion for any period of time, so transferred back to bed. Pt continues with gross weakness, balance, and decreased activity tolerance. SNF remains appropriate as pt would not be able to care for himself at home alone.    Follow Up Recommendations  SNF     Equipment Recommendations  None recommended by PT    Recommendations for Other Services       Precautions / Restrictions Precautions Precautions: Fall(L THA with incomplete healing) Restrictions Weight Bearing Restrictions: No    Mobility  Bed Mobility Overal bed mobility: Needs Assistance Bed Mobility: Supine to Sit;Sit to Supine     Supine to sit: Max assist;+2 for physical assistance Sit to supine: Max assist   General bed mobility comments: pt able to initiate RLE movement off of the bed and reach for bed rail towards right, but requiring maxA + 2 for LLE negotiation and trunk lift assist. For return to supine, provided maxA for BLE negotiation back onto bed  Transfers Overall transfer level: Needs assistance Equipment used: Rolling walker (2 wheeled) Transfers: Sit to/from [facility] Sit to Stand: Mod assist;+2 physical assistance;From elevated surface Stand pivot transfers: Min assist;+2  physical assistance       General transfer comment: pt requiring modA + 2 to stand and then minA + 2 to stand pivot towards right side. Pt unable to tolerate sitting > 30 seconds, so performed second stand pivot towards left side back to bed. cues provided for reciprocal scooting to edge of seat and rocking forward to gain momentum to power up to stand  Ambulation/Gait                 Stairs             Wheelchair Mobility    Modified Rankin (Stroke Patients Only)       Balance Overall balance assessment: Needs assistance Sitting-balance support: Feet supported;Bilateral upper extremity supported Sitting balance-[name]: Fair     Standing balance support: Bilateral upper extremity supported Standing balance-[name]: Poor                              Cognition Arousal/Alertness: Awake/alert Behavior During Therapy: WFL for tasks assessed/performed Overall Cognitive Status: Within Functional Limits for tasks assessed                                        Exercises      General Comments        Pertinent Vitals/Pain Pain Assessment: Faces Faces Pain Scale: Hurts whole lot Pain Location: buttocks Pain Descriptors / Indicators: Grimacing;Guarding Pain Intervention(s): Monitored during session;Repositioned    Home  Living                      Prior Function            PT Goals (current goals can now be found in the care plan section) Acute Rehab PT Goals Patient Stated Goal: to get stronger and get home PT Goal Formulation: With patient Time For Goal Achievement: 10/16/18 Potential to Achieve Goals: Good Progress towards PT goals: Progressing toward goals    Frequency    Min 2X/week      PT Plan Current plan remains appropriate    Co-evaluation              AM-PAC PT "6 Clicks" Mobility   Outcome Measure  Help needed turning from your back to your side while in a flat bed without  using bedrails?: A Lot Help needed moving from lying on your back to sitting on the side of a flat bed without using bedrails?: A Lot Help needed moving to and from a bed to a chair (including a wheelchair)?: A Lot Help needed standing up from a chair using your arms (e.g., wheelchair or bedside chair)?: Total Help needed to walk in hospital room?: Total Help needed climbing 3-5 steps with a railing? : Total 6 Click Score: 9    End of Session Equipment Utilized During Treatment: Gait belt Activity Tolerance: Patient limited by pain Patient left: in bed;with call bell/phone within reach;with bed alarm set Nurse Communication: Mobility status PT Visit Diagnosis: Other abnormalities of gait and mobility (R26.89);Muscle weakness (generalized) (M62.81);Difficulty in walking, not elsewhere classified (R26.2);History of falling (Z91.81)     Time: 3546-5681 PT Time Calculation (min) (ACUTE ONLY): 23 min  Charges:  $Gait Training: 8-22 mins $Therapeutic Activity: 8-22 mins                     Ellamae Sia, PT, DPT Acute Rehabilitation Services Pager [phone] Office [phone]    Willy Eddy 10/05/2018, 2:35 PM

## 2018-10-05 NOTE — Progress Notes (Signed)
Pharmacy Antibiotic Note  [name] is a 72 y.o. male admitted on 10/01/2018 with sepsis.  Pharmacy has been consulted for Vancomycin dosing.  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 H/o prosthetic joint infection. Follows with ID at [facility] completed a course of ertapenem and continued on Flagyl po through November 19, 2018  - WBC 11.6, LA 1.7  Vancomycin 2000 mg IV Q 36 hrs. Goal AUC 400-550. Expected AUC: 491 SCr used: 1.5  Plan: Vanco  2g IV q 36 hrs.  Likely transition to oral doxycycline today per MD note and then transfer to SNF Flagyl 500mg  PO TID until 11/19/18    Height: 5\' 8"  (172.7 cm) Weight: 272 lb 14.9 oz (123.8 kg) IBW/kg (Calculated) : 68.4  Temp (24hrs), Avg:97.9 F (36.6 C), Min:97.3 F (36.3 C), Max:98.6 F (37 C)  Recent Labs  Lab 10/01/18 1833 10/01/18 1835 10/02/18 0112 10/02/18 0524 10/03/18 0531 10/04/18 0509 10/05/18 0359  WBC  --  11.6* 9.1  --  6.6 7.2  --   CREATININE  --  1.50*  --  1.29* 1.21 1.00 1.07  LATICACIDVEN 1.7  --   --   --   --   --   --     Estimated Creatinine Clearance: 80 mL/min (by C-G formula based on SCr of 1.07 mg/dL).    Allergies  Allergen Reactions  . Bee Venom Swelling  . Nickel Rash    [name], PharmD, [place] Clinical Staff Pharmacist  [name] 10/05/2018 8:48 AM

## 2018-10-05 NOTE — Progress Notes (Signed)
[facility]  Patient Status: Active (receiving services up to time of hospitalization)  [facility] is providing the following services: PT and OT  If patient discharges after hours, please call [phone].   Janae Sauce 10/05/2018, 10:06 AM

## 2018-10-05 NOTE — Progress Notes (Signed)
PROGRESS NOTE        PATIENT DETAILS Name: [name] Age: 73 y.o. Sex: male Date of Birth: Mar 13, 1946 Admit Date: 10/01/2018 Admitting Physician Vianne Bulls, MD [id number], Hollice Espy, MD  Brief Narrative: Patient is a 73 y.o. male with history of A. 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  Found to have a soft tissue infection of the left hip area, acute kidney injury and hyperkalemia.  Subsequently admitted to the hospitalist service.  See below for further details  Subjective: Patient in bed, appears comfortable, denies any headache, no fever, no chest pain or pressure, no shortness of breath , no abdominal pain. No focal weakness.  Assessment/Plan:  AKI due to ATN from Infection with hyperkalemia: Diuretics were held, he was given Kayexalate and hydrated, this problem has resolved.  Left hip soft tissue infection: Soft tissue CT scan of the left hip does not show any joint involvement.  He has been placed on IV vancomycin along with home medication Flagyl which he takes for chronic left hip prosthetic joint infection.  Soft tissue infection has improved, I wonder if there is element of massive edema causing some erythema as well, cleared by orthopedics from joint involvement standpoint which was ruled out by CT scan as well.  Will diurese continue IV antibiotics another day and reevaluate tomorrow.  History of left total hip arthroplasty with subsequent infection with Bacteroides  fragilis: His orthopedic surgeon here in [place] is Dr. Henrietta Dine however was evaluated by orthopedics and ID and [facility] once he had left hip infection.  He is on Flagyl-with planned end date November 19, 2018.  PDX was consulted and cleared him from joint perspective on 10/04/2018.  Recurrent pleural effusion: Felt to be secondary to hepatic hydrothorax-recent chest x-ray shows a small left pleural effusion seen on x-ray.  Has had extensive work-up including multiple thoracocentesis in the past.  Have placed him on IV diuretics will continue to monitor.  Cryptogenic cirrhosis: Prior hepatitis serology done earlier this month was negative-agree with plans outlined in prior discharge summary for outpatient follow-up. He now has volume overload/anasarca, diurese with IV albumin and monitor  Chronic atrial fibrillation with [place] vas 2 score of at least 2: Rate controlled with Cardizem and metoprolol, continue Eliquis.  Cardizem dose has been increased on 10/04/2018 for better rate control.  Chronic back and right shoulder pain: Continue Lidoderm patch, Tylenol and as needed narcotics.  Bipolar disorder: Stable-continue Lexapro, Neurontin  OSA: Continue CPAP nightly  Morbid obesity  Acute on chronic debility/deconditioning: PT requested, may require SNF.    DVT Prophylaxis: Full dose anticoagulation with Eliquis  Code Status: Full code  Family Communication: None at bedside  Disposition Plan: Remain inpatient-but will plan on Home health vs SNF on discharge  Antimicrobial agents: Anti-infectives (From admission, onward)   Start     Dose/Rate Route Frequency Ordered Stop   10/03/18 1800  metroNIDAZOLE (FLAGYL) tablet 500 mg  500 mg Oral Every 8 hours 10/03/18 1054     10/03/18 0700  vancomycin (VANCOCIN) 2,000 mg in sodium chloride 0.9 % 500 mL IVPB     2,000 mg 250 mL/hr over 120 Minutes Intravenous Every 36 hours 10/01/18 1942     10/02/18 0200  ceFEPIme  (MAXIPIME) 2 g in sodium chloride 0.9 % 100 mL IVPB  Status:  Discontinued     2 g 200 mL/hr over 30 Minutes Intravenous Every 8 hours 10/01/18 1942 10/02/18 1008   10/01/18 1830  vancomycin (VANCOCIN) 2,500 mg in sodium chloride 0.9 % 500 mL IVPB     2,500 mg 250 mL/hr over 120 Minutes Intravenous STAT 10/01/18 1825 10/02/18 0126   10/01/18 1800  ceFEPIme (MAXIPIME) 2 g in sodium chloride 0.9 % 100 mL IVPB     2 g 200 mL/hr over 30 Minutes Intravenous  Once 10/01/18 1747 10/01/18 1903   10/01/18 1800  metroNIDAZOLE (FLAGYL) IVPB 500 mg  Status:  Discontinued     500 mg 100 mL/hr over 60 Minutes Intravenous Every 8 hours 10/01/18 1747 10/03/18 1054   10/01/18 1800  vancomycin (VANCOCIN) IVPB 1000 mg/200 mL premix  Status:  Discontinued     1,000 mg 200 mL/hr over 60 Minutes Intravenous  Once 10/01/18 1747 10/01/18 1825      Procedures: None  CONSULTS:  None  Time spent: 25- minutes-Greater than 50% of this time was spent in counseling, explanation of diagnosis, planning of further management, and coordination of care.  MEDICATIONS: Scheduled Meds: . apixaban  5 mg Oral BID  . cycloSPORINE  1 drop Both Eyes BID  . diltiazem  240 mg Oral Daily  . escitalopram  10 mg Oral Daily  . furosemide  60 mg Intravenous BID  . gabapentin  100 mg Oral TID  . lidocaine  1 patch Transdermal Q24H  . Melatonin  6 mg Oral QHS  . metoprolol tartrate  25 mg Oral BID  . metroNIDAZOLE  500 mg Oral Q8H  . multivitamin with minerals  1 tablet Oral Daily  . pantoprazole  40 mg Oral Daily  . ENSURE MAX PROTEIN  11 oz Oral BID  . spironolactone  50 mg Oral Daily  . thiamine  100 mg Oral Daily   Continuous Infusions: . albumin human    . vancomycin 2,000 mg (10/04/18 2224)   PRN Meds:.acetaminophen, albuterol, diltiazem, hydrOXYzine, [DISCONTINUED] ondansetron **OR** ondansetron (ZOFRAN) IV, oxyCODONE, polyethylene glycol, traZODone   PHYSICAL EXAM: Vital signs: Vitals:   10/04/18 1448  10/04/18 2158 10/05/18 0457 10/05/18 0459  BP: 99/63 109/68  104/70  Pulse: 78 (!) 105  95  Resp: 18 18  18   Temp: (!) 97.3 F (36.3 C) 98.6 F (37 C)  97.9 F (36.6 C)  TempSrc: Oral Oral  Oral  SpO2: (!) 74% 96%    Weight:   123.8 kg   Height:       Filed Weights   10/01/18 1725 10/05/18 0457  Weight: 135.2 kg 123.8 kg   Body mass index is 41.5 kg/m.   Exam  Awake Alert, Oriented X 3, No new F.N deficits, Normal affect [place].AT,PERRAL Supple Neck,No JVD, No cervical lymphadenopathy appriciated.  Symmetrical Chest wall movement, Good air movement bilaterally, CTAB RRR,No Gallops, Rubs or new Murmurs, No Parasternal Heave +ve B.Sounds, Abd Soft, No tenderness, No organomegaly appriciated, No rebound - guarding or rigidity. No Cyanosis, Clubbing , anasarca , L. Hip cellulitis as below       I have  personally reviewed following labs and imaging studies  LABORATORY DATA: CBC: Recent Labs  Lab 10/01/18 1835 10/02/18 0112 10/03/18 0531 10/04/18 0509  WBC 11.6* 9.1 6.6 7.2  NEUTROABS 9.4* 6.4  --   --   HGB 13.1 11.3* 11.0* 11.0*  HCT 42.9 35.5* 34.9* 34.6*  MCV 87.9 85.3 87.3 87.8  PLT 248 228 224 570    Basic Metabolic Panel: Recent Labs  Lab 10/01/18 1835  10/02/18 0524  10/03/18 0100 10/03/18 0531 10/03/18 0912 10/04/18 0509 10/05/18 0359  NA 133*  --  132*  --   --  135  --  137 136  K 6.2*   < > 4.6   < > 5.3* 5.2* 4.6 4.4 4.5  CL 99  --  104  --   --  106  --  107 104  CO2 23  --  20*  --   --  21*  --  22 22  GLUCOSE 103*  --  104*  --   --  105*  --  102* 99  BUN 50*  --  44*  --   --  40*  --  35* 33*  CREATININE 1.50*  --  1.29*  --   --  1.21  --  1.00 1.07  CALCIUM 9.9  --  9.5  --   --  9.1  --  9.0 9.1  MG  --   --   --   --   --   --   --   --  2.1   < > = values in this interval not displayed.    GFR: Estimated Creatinine Clearance: 80 mL/min (by C-G formula based on SCr of 1.07 mg/dL).  Liver Function Tests: Recent Labs  Lab  10/01/18 1835  AST 19  ALT 11  ALKPHOS 56  BILITOT 0.5  PROT 7.4  ALBUMIN 3.1*   No results for input(s): LIPASE, AMYLASE in the last 168 hours. No results for input(s): AMMONIA in the last 168 hours.  Coagulation Profile: No results for input(s): INR, PROTIME in the last 168 hours.  Cardiac Enzymes: No results for input(s): CKTOTAL, CKMB, CKMBINDEX, TROPONINI in the last 168 hours.  BNP (last 3 results) No results for input(s): PROBNP in the last 8760 hours.  HbA1C: No results for input(s): HGBA1C in the last 72 hours.  CBG: Recent Labs  Lab 10/03/18 0747 10/04/18 0805 10/05/18 0844  GLUCAP 96 92 90    Lipid Profile: No results for input(s): CHOL, HDL, LDLCALC, TRIG, CHOLHDL, LDLDIRECT in the last 72 hours.  Thyroid Function Tests: No results for input(s): TSH, T4TOTAL, FREET4, T3FREE, THYROIDAB in the last 72 hours.  Anemia Panel: No results for input(s): VITAMINB12, FOLATE, FERRITIN, TIBC, IRON, RETICCTPCT in the last 72 hours.  Urine analysis:    Component Value Date/Time   COLORURINE YELLOW 10/01/2018 1822   APPEARANCEUR CLEAR 10/01/2018 1822   APPEARANCEUR Clear 01/18/2018 1539   LABSPEC 1.010 10/01/2018 1822   PHURINE 5.0 10/01/2018 1822   GLUCOSEU NEGATIVE 10/01/2018 1822   HGBUR NEGATIVE 10/01/2018 1822   BILIRUBINUR NEGATIVE 10/01/2018 1822   BILIRUBINUR Negative 01/18/2018 1539   KETONESUR NEGATIVE 10/01/2018 1822   PROTEINUR NEGATIVE 10/01/2018 1822   UROBILINOGEN 0.2 09/06/2012 0213   NITRITE NEGATIVE 10/01/2018 1822   LEUKOCYTESUR NEGATIVE 10/01/2018 1822   LEUKOCYTESUR Negative 01/18/2018 1539    Sepsis Labs: Lactic Acid, Venous    Component Value Date/Time   LATICACIDVEN 1.7 10/01/2018 1833    MICROBIOLOGY:  Recent Results (from the past 240 hour(s))  Blood Culture (routine x 2)     Status: None (Preliminary result)   Collection Time: 10/01/18  6:15 PM  Result Value Ref Range Status   Specimen Description BLOOD LEFT HAND  Final     Special Requests   Final    BOTTLES DRAWN AEROBIC AND ANAEROBIC Blood Culture adequate volume   Culture   Final    NO GROWTH 4 DAYS Performed at [facility], [address], [place], [place] [address]    Report Status PENDING  Incomplete  Blood Culture (routine x 2)     Status: None (Preliminary result)   Collection Time: 10/01/18  6:17 PM  Result Value Ref Range Status   Specimen Description BLOOD RIGHT ANTECUBITAL  Final   Special Requests   Final    BOTTLES DRAWN AEROBIC AND ANAEROBIC Blood Culture adequate volume   Culture   Final    NO GROWTH 4 DAYS Performed at [facility], [place] [address] [place], [place] [address]    Report Status PENDING  Incomplete  Urine culture     Status: Abnormal   Collection Time: 10/01/18  6:24 PM  Result Value Ref Range Status   Specimen Description URINE, CLEAN CATCH  Final   Special Requests   Final    NONE Performed at [facility], [place] [address] [place], [place] [address]    Culture 20,000 COLONIES/mL PROTEUS MIRABILIS (A)  Final   Report Status 10/03/2018 FINAL  Final   Organism ID, Bacteria PROTEUS MIRABILIS (A)  Final      Susceptibility   Proteus mirabilis - MIC*    AMPICILLIN <=2 SENSITIVE Sensitive     CEFAZOLIN <=4 SENSITIVE Sensitive     CEFTRIAXONE <=1 SENSITIVE Sensitive     CIPROFLOXACIN <=0.25 SENSITIVE Sensitive     GENTAMICIN <=1 SENSITIVE Sensitive     IMIPENEM 8 INTERMEDIATE Intermediate     NITROFURANTOIN 128 RESISTANT Resistant     TRIMETH/SULFA <=20 SENSITIVE Sensitive     AMPICILLIN/SULBACTAM <=2 SENSITIVE Sensitive     PIP/TAZO <=4 SENSITIVE Sensitive     * 20,000 COLONIES/mL PROTEUS MIRABILIS    RADIOLOGY STUDIES/RESULTS: Dg Chest 1 View  Result Date: 10/01/2018 CLINICAL DATA:  Follow-up pneumothorax. EXAM: CHEST  1 VIEW COMPARISON:  09/09/2018 FINDINGS: Mild bilateral interstitial thickening. Small bilateral pleural effusions. Left lateral pleural thickening with a subtle lucency which  may reflect a small loculated pneumothorax versus artifact. Right infrahilar hazy airspace disease likely reflecting atelectasis. Stable cardiomediastinal silhouette. No aggressive osseous lesion. Mild osteoarthritis of bilateral glenohumeral joints. IMPRESSION: Left lateral pleural thickening with a subtle lucency which may reflect a small loculated pneumothorax versus artifact. Recommend follow-up chest x-ray for re-evaluation. Bilateral interstitial thickening and small bilateral pleural effusions concerning for mild pulmonary edema. Electronically Signed   By: Kathreen Devoid   On: 10/01/2018 15:38   Dg Chest 2 View  Result Date: 09/09/2018 CLINICAL DATA:  BILATERAL pleural effusions, shortness of breath, weakness, history hypertension, CHF, former smoker EXAM: CHEST - 2 VIEW COMPARISON:  09/08/2018 FINDINGS: Enlargement of cardiac silhouette. Atherosclerotic calcification aorta. Mediastinal contours and pulmonary vascularity normal. Bibasilar effusions and atelectasis. Upper lungs clear. No pleural effusion or pneumothorax. Degenerative disc disease changes thoracic spine as well as BILATERAL glenohumeral degenerative changes. IMPRESSION: Enlargement of cardiac silhouette with bibasilar pleural effusions and atelectasis. Electronically Signed   By: Lavonia Dana M.D.   On: 09/09/2018 15:08   Dg Chest 2  View  Result Date: 09/08/2018 CLINICAL DATA:  Recurrent pleural effusion on the right EXAM: CHEST - 2 VIEW COMPARISON:  Three days ago FINDINGS: Unchanged small to moderate right pleural effusion that appears to be layering. Unchanged pleural thickening on the left. Chronic cardiomegaly. Stable interstitial coarsening. IMPRESSION: 1. Unchanged small to moderate right pleural effusion. 2. Stable pleural thickening on the left. Electronically Signed   By: Monte Fantasia M.D.   On: 09/08/2018 08:38   Dg Chest 2 View  Result Date: 09/05/2018 CLINICAL DATA:  One day post thoracentesis. EXAM: CHEST - 2 VIEW  COMPARISON:  09/04/2018 FINDINGS: Cardiac silhouette is mildly enlarged. There is interstitial thickening that appears increased compared to the prior. Left pleural effusion is stable. There has been increase in right pleural fluid. No pneumothorax. Skeletal structures are grossly intact. IMPRESSION: 1. Worsened lung aeration compared to the previous day's study. Findings are consistent with interstitial pulmonary edema, likely due to congestive heart failure, with pleural effusions. Right pleural effusion has increased from the previous day's exam. 2. No pneumothorax. Electronically Signed   By: Lajean Manes M.D.   On: 09/05/2018 16:01   Dg Shoulder Right  Result Date: 09/05/2018 CLINICAL DATA:  Chronic right shoulder pain. EXAM: RIGHT SHOULDER - 2+ VIEW COMPARISON:  Plain films right shoulder 05/17/2018. FINDINGS: The patient has bone-on-bone glenohumeral osteoarthritis with small subchondral cysts, sclerosis and an osteophyte off the humeral head. Loose bodies in the subscapularis recess noted. Moderate acromioclavicular osteoarthritis is seen. No acute bony abnormality. IMPRESSION: No acute finding. 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 Electronically Signed   By: Inge Rise M.D.   On: 09/05/2018 16:02   Ct Hip Left W Contrast  Result Date: 10/01/2018 CLINICAL DATA:  Left hip pain EXAM: CT OF THE LOWER LEFT EXTREMITY WITH CONTRAST TECHNIQUE: Multidetector CT imaging of the lower left extremity was performed according to the standard protocol following intravenous contrast administration. COMPARISON:  09/03/2018 CONTRAST:  137mL OMNIPAQUE IOHEXOL 300 MG/ML  SOLN FINDINGS: Bones/Joint/Cartilage An uncemented left total hip arthroplasty is identified with the tip traversing a left femoral diaphyseal fracture that is incompletely healed. Subtle residual fracture lucencies are suggested on the coronal reformats, series 6/46 through 48 for instance. There is callus  formation developing about the fracture, series 3/143. Slight residual varus configuration about the fracture is seen of the femur. No new/acute appearing fracture or bone destruction is seen. With reference to the hip joint, no significant joint effusion or abnormal lucencies to suggest loosening. The prosthetic femoral head is seated within the acetabular component without evidence of asymmetric lining wear. No soft tissue mass or mineralization. No hematoma or abnormal fluid collections. Osteoarthritis of the included SI joints and pubic symphysis. No diastasis. Ligaments Suboptimally assessed by CT. Muscles and Tendons Negative for muscle edema, hematoma or definite tear. Soft tissues No soft tissue mass, mineralization or fluid collections. Postop scarring along the lateral aspect of the hip. IMPRESSION: 1. An uncemented left total hip arthroplasty is identified with the tip traversing a left femoral diaphyseal fracture that is incompletely healed. Subtle residual fracture lucencies are suggested on the coronal reformats, series 6/46 through 48. Slight residual varus configuration of the femur about the fracture is noted. 2. No new/acute appearing fracture or bone destruction is seen. 3. No significant joint effusion to suggest septic arthritis though this is of clinical concern, percutaneous sampling of the joint fluid could be attempted. No definite evidence of hardware loosening however. Electronically Signed   By: Ashley Royalty  M.D.   On: 10/01/2018 20:08   US Abdomen Complete  Result Date: 09/06/2018 CLINICAL DATA:  Cirrhosis, status post cholecystectomy EXAM: ABDOMEN ULTRASOUND COMPLETE COMPARISON:  CT abdomen/pelvis dated 01/09/2015 FINDINGS: Gallbladder: Surgically absent. Common bile duct: Diameter: 20 mm, chronic Liver: Coarse pack echotexture with nodular hepatic contour, reflecting cirrhosis. Mild intrahepatic ductal dilatation in the left hepatic lobe, chronic. Portal vein is patent on color  Doppler imaging with normal direction of blood flow towards the liver. IVC: No abnormality visualized. Pancreas: Not visualized due to overlying bowel gas. Spleen: Size and appearance within normal limits. Right Kidney: Length: 11.2 cm. Poorly visualized. No mass or hydronephrosis. Left Kidney: Length: 10.8 cm.  No mass or hydronephrosis. Abdominal aorta: No aneurysm visualized. Other findings: Right pleural effusion. IMPRESSION: Cirrhosis.  No focal hepatic lesion is seen. Status post cholecystectomy. Dilated common duct, measuring 20 mm, chronic. Mild intrahepatic ductal dilatation, chronic. Right pleural effusion. Electronically Signed   By: Julian Hy M.D.   On: 09/06/2018 18:39   Dg Chest Port 1 View  Result Date: 10/01/2018 CLINICAL DATA:  LEFT hip replacement 6 months ago, sepsis, question LEFT leg/hip infection EXAM: PORTABLE CHEST 1 VIEW COMPARISON:  Portable exam 1756 hours compared to 10/01/2018 FINDINGS: RIGHT costophrenic angle excluded. Enlargement of cardiac silhouette. Mediastinal contours and pulmonary vascularity normal. Atherosclerotic calcification aorta. Small LEFT pleural effusion and basilar atelectasis at lower LEFT chest question partially loculated. No definite infiltrate or pneumothorax. Bones demineralized. IMPRESSION: LEFT pleural effusion and basilar atelectasis, question partially locule at the lower lateral LEFT chest. Electronically Signed   By: Lavonia Dana M.D.   On: 10/01/2018 18:05     LOS: 1 day   Signature  Lala Lund M.D on 10/05/2018 at 9:42 AM   -  To page go to [email or web address]

## 2018-10-05 NOTE — Care Management Note (Signed)
Case Management Note  Patient Details  Name: [name] MRN: [id number] Date of Birth: Sep 17, 1945  Subjective/Objective:   Admitted with  L leg cellulitis, hx of chronic back and right shoulder pain, OSA on CPAP, paroxysmal atrial fibrillation on , hepatic cirrhosis, recurrent pleural effusions,status post left total hip arthroplasty. From home with wife Recent admit 12/17-1/2, a.fib with RVR. Active with [facility] , PT/OT, PTA. DME: rolling walker , 3in 1.     PCP: [name]   Action/Plan: SNF vs home with home health services. CSW aware of SNF need.Marland KitchenMarland KitchenNCM will contine to monitor for TOC needs.  Expected Discharge Date:                  Expected Discharge Plan:  Skilled Nursing Facility  In-House Referral:  Clinical Social Work  Discharge planning Services  CM Consult  Post Acute Care Choice:    Choice offered to:     DME Arranged:    DME Agency:     HH Arranged:    [place] Agency:     Status of Service:  In process, will continue to follow  If discussed at Long Length of Stay Meetings, dates discussed:    Additional Comments:  Sharin Mons, RN 10/05/2018, 1:23 PM

## 2018-10-06 LAB — CULTURE, BLOOD (ROUTINE X 2)
Culture: NO GROWTH
Culture: NO GROWTH
Special Requests: ADEQUATE
Special Requests: ADEQUATE

## 2018-10-06 LAB — BASIC METABOLIC PANEL
Anion gap: 13 (ref 5–15)
BUN: 37 mg/dL — ABNORMAL HIGH (ref 8–23)
CO2: 22 mmol/L (ref 22–32)
Calcium: 9.1 mg/dL (ref 8.9–10.3)
Chloride: 100 mmol/L (ref 98–111)
Creatinine, Ser: 1.25 mg/dL — ABNORMAL HIGH (ref 0.61–1.24)
GFR calc Af Amer: >60 mL/min (ref >60)
GFR calc non Af Amer: 57 mL/min — ABNORMAL LOW (ref >60)
Glucose, Bld: 99 mg/dL (ref 70–99)
Potassium: 4.3 mmol/L (ref 3.5–5.1)
Sodium: 135 mmol/L (ref 135–145)

## 2018-10-06 LAB — GLUCOSE, CAPILLARY: Glucose-Capillary: 80 mg/dL (ref 70–99)

## 2018-10-06 MED ORDER — DOXYCYCLINE HYCLATE 100 MG PO TABS
100.0000 mg | ORAL_TABLET | Freq: Two times a day (BID) | ORAL | Status: DC
  Administered 2018-10-06 – 2018-10-11 (×11): 100 mg via ORAL
  Filled 2018-10-06 (×11): qty 1

## 2018-10-06 MED ORDER — FUROSEMIDE 10 MG/ML IJ SOLN
60.0000 mg | Freq: Once | INTRAMUSCULAR | Status: AC
  Administered 2018-10-06: 60 mg via INTRAVENOUS
  Filled 2018-10-06: qty 6

## 2018-10-06 MED ORDER — ALBUMIN HUMAN 25 % IV SOLN
25.0000 g | Freq: Once | INTRAVENOUS | Status: AC
  Administered 2018-10-06: 25 g via INTRAVENOUS
  Filled 2018-10-06: qty 50

## 2018-10-06 NOTE — Progress Notes (Signed)
PROGRESS NOTE        PATIENT DETAILS Name: [name] Age: 73 y.o. Sex: male Date of Birth: 07-18-1946 Admit Date: 10/01/2018 Admitting Physician Vianne Bulls, MD [id number], Hollice Espy, MD  Brief Narrative: Patient is a 73 y.o. male with history of A. 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  Found to have a soft tissue infection of the left hip area, acute kidney injury and hyperkalemia.  Subsequently admitted to the hospitalist service.  See below for further details  Subjective: Patient in bed, appears comfortable, denies any headache, no fever, no chest pain or pressure, no shortness of breath , no abdominal pain. No focal weakness.   Assessment/Plan:  AKI due to ATN from Infection with hyperkalemia: Diuretics were held, he was given Kayexalate and hydrated, this problem has resolved.  Left hip soft tissue infection: Soft tissue CT scan of the left hip does not show any joint involvement.  He has been placed on IV vancomycin along with home medication Flagyl which he takes for chronic left hip prosthetic joint infection.  Soft tissue infection has improved, I wonder if there is element of massive edema causing some erythema as well, cleared by orthopedics from joint involvement standpoint which was ruled out by CT scan as well.  Stopped IV antibiotics on 10/06/2018 and switched him on oral doxycycline, continue to monitor with diuresis.  History of left total hip arthroplasty with  subsequent infection with Bacteroides fragilis: His orthopedic surgeon here in [place] is Dr. Henrietta Dine however was evaluated by orthopedics and ID and [facility] once he had left hip infection.  He is on Flagyl-with planned end date November 19, 2018.  PDX was consulted and cleared him from joint perspective on 10/04/2018.  Recurrent pleural effusion: Felt to be secondary to hepatic hydrothorax-recent chest x-ray shows a small left pleural effusion seen on x-ray.  Has had extensive work-up including multiple thoracocentesis in the past.  Have placed him on IV diuretics will continue to monitor.  Cryptogenic cirrhosis: Prior hepatitis serology done earlier this month was negative-agree with plans outlined in prior discharge summary for outpatient follow-up. He now has volume overload/anasarca, diurese with IV albumin and monitor  Chronic atrial fibrillation with [place] vas 2 score of at least 2: Rate controlled with Cardizem and metoprolol, continue Eliquis.  Cardizem dose has been increased on 10/04/2018 for better rate control.  Chronic back and right shoulder pain: Continue Lidoderm patch, Tylenol and as needed narcotics.  Bipolar disorder: Stable-continue Lexapro, Neurontin  OSA: Continue CPAP nightly  Morbid obesity  Acute on chronic debility/deconditioning: PT requested, may require SNF.    DVT Prophylaxis: Full dose anticoagulation with Eliquis  Code Status: Full code  Family Communication: None at bedside  Disposition Plan: Remain inpatient-but will plan on Home health vs SNF on discharge  Antimicrobial agents: Anti-infectives (From admission, onward)   Start     Dose/Rate Route Frequency Ordered Stop   10/06/18 1000  doxycycline (VIBRA-TABS) tablet 100 mg     100 mg Oral Every 12 hours 10/06/18 0726     10/03/18 1800  metroNIDAZOLE (FLAGYL) tablet 500 mg     500 mg Oral Every 8 hours 10/03/18 1054     10/03/18 0700  vancomycin (VANCOCIN) 2,000 mg in sodium  chloride 0.9 % 500 mL IVPB  Status:  Discontinued     2,000 mg 250 mL/hr over 120 Minutes Intravenous Every 36 hours 10/01/18 1942 10/06/18 0726   10/02/18 0200  ceFEPIme (MAXIPIME) 2 g in sodium chloride 0.9 % 100 mL IVPB  Status:  Discontinued     2 g 200 mL/hr over 30 Minutes Intravenous Every 8 hours 10/01/18 1942 10/02/18 1008   10/01/18 1830  vancomycin (VANCOCIN) 2,500 mg in sodium chloride 0.9 % 500 mL IVPB     2,500 mg 250 mL/hr over 120 Minutes Intravenous STAT 10/01/18 1825 10/02/18 0126   10/01/18 1800  ceFEPIme (MAXIPIME) 2 g in sodium chloride 0.9 % 100 mL IVPB     2 g 200 mL/hr over 30 Minutes Intravenous  Once 10/01/18 1747 10/01/18 1903   10/01/18 1800  metroNIDAZOLE (FLAGYL) IVPB 500 mg  Status:  Discontinued     500 mg 100 mL/hr over 60 Minutes Intravenous Every 8 hours 10/01/18 1747 10/03/18 1054   10/01/18 1800  vancomycin (VANCOCIN) IVPB 1000 mg/200 mL premix  Status:  Discontinued     1,000 mg 200 mL/hr over 60 Minutes Intravenous  Once 10/01/18 1747 10/01/18 1825      Procedures: None  CONSULTS:  None  Time spent: 25- minutes-Greater than 50% of this time was spent in counseling, explanation of diagnosis, planning of further management, and coordination of care.  MEDICATIONS: Scheduled Meds: . apixaban  5 mg Oral BID  . cycloSPORINE  1 drop Both Eyes BID  . diltiazem  240 mg Oral Daily  . doxycycline  100 mg Oral Q12H  . escitalopram  10 mg Oral Daily  . furosemide  60 mg Intravenous Once  . gabapentin  100 mg Oral TID  . lidocaine  1 patch Transdermal Q24H  . Melatonin  6 mg Oral QHS  . metoprolol tartrate  25 mg Oral BID  . metroNIDAZOLE  500 mg Oral Q8H  . multivitamin with minerals  1 tablet Oral Daily  . pantoprazole  40 mg Oral Daily  . ENSURE MAX PROTEIN  11 oz Oral BID  . spironolactone  50 mg Oral Daily  . thiamine  100 mg Oral Daily   Continuous Infusions: . albumin human     PRN Meds:.acetaminophen, albuterol, diltiazem,  hydrOXYzine, [DISCONTINUED] ondansetron **OR** ondansetron (ZOFRAN) IV, oxyCODONE, polyethylene glycol, traZODone   PHYSICAL EXAM: Vital signs: Vitals:   10/05/18 1440 10/05/18 2121 10/06/18 0439 10/06/18 0441  BP: (!) 109/59 (!) 101/59 (!) 101/59   Pulse: (!) 37 (!) 107 99   Resp: 15 18 18    Temp: (!) 97.5 F (36.4 C) 99.8 F (37.7 C) 98.2 F (36.8 C)   TempSrc: Oral Oral Oral   SpO2: 100% 97% 97%   Weight:    120.8 kg  Height:       Filed Weights   10/01/18 1725 10/05/18 0457 10/06/18 0441  Weight: 135.2 kg 123.8 kg 120.8 kg   Body mass index is 40.49 kg/m.   Exam  Awake Alert, Oriented X 3, No new F.N deficits, Normal affect .AT,PERRAL Supple Neck,No JVD, No cervical lymphadenopathy appriciated.  Symmetrical Chest wall movement, Good air movement  bilaterally, CTAB RRR,No Gallops, Rubs or new Murmurs, No Parasternal Heave +ve B.Sounds, Abd Soft, No tenderness, No organomegaly appriciated, No rebound - guarding or rigidity. No Cyanosis, Anasarca , L. Hip cellulitis as below       I have personally reviewed following labs and imaging studies  LABORATORY DATA: CBC: Recent Labs  Lab 10/01/18 1835 10/02/18 0112 10/03/18 0531 10/04/18 0509  WBC 11.6* 9.1 6.6 7.2  NEUTROABS 9.4* 6.4  --   --   HGB 13.1 11.3* 11.0* 11.0*  HCT 42.9 35.5* 34.9* 34.6*  MCV 87.9 85.3 87.3 87.8  PLT 248 228 224 616    Basic Metabolic Panel: Recent Labs  Lab 10/02/18 0524  10/03/18 0531 10/03/18 0912 10/04/18 0509 10/05/18 0359 10/06/18 0422  NA 132*  --  135  --  137 136 135  K 4.6   < > 5.2* 4.6 4.4 4.5 4.3  CL 104  --  106  --  107 104 100  CO2 20*  --  21*  --  22 22 22   GLUCOSE 104*  --  105*  --  102* 99 99  BUN 44*  --  40*  --  35* 33* 37*  CREATININE 1.29*  --  1.21  --  1.00 1.07 1.25*  CALCIUM 9.5  --  9.1  --  9.0 9.1 9.1  MG  --   --   --   --   --  2.1  --    < > = values in this interval not displayed.    GFR: Estimated Creatinine Clearance: 67.5  mL/min (A) (by C-G formula based on SCr of 1.25 mg/dL (H)).  Liver Function Tests: Recent Labs  Lab 10/01/18 1835  AST 19  ALT 11  ALKPHOS 56  BILITOT 0.5  PROT 7.4  ALBUMIN 3.1*   No results for input(s): LIPASE, AMYLASE in the last 168 hours. No results for input(s): AMMONIA in the last 168 hours.  Coagulation Profile: No results for input(s): INR, PROTIME in the last 168 hours.  Cardiac Enzymes: No results for input(s): CKTOTAL, CKMB, CKMBINDEX, TROPONINI in the last 168 hours.  BNP (last 3 results) No results for input(s): PROBNP in the last 8760 hours.  HbA1C: No results for input(s): HGBA1C in the last 72 hours.  CBG: Recent Labs  Lab 10/03/18 0747 10/04/18 0805 10/05/18 0844  GLUCAP 96 92 90    Lipid Profile: No results for input(s): CHOL, HDL, LDLCALC, TRIG, CHOLHDL, LDLDIRECT in the last 72 hours.  Thyroid Function Tests: No results for input(s): TSH, T4TOTAL, FREET4, T3FREE, THYROIDAB in the last 72 hours.  Anemia Panel: No results for input(s): VITAMINB12, FOLATE, FERRITIN, TIBC, IRON, RETICCTPCT in the last 72 hours.  Urine analysis:    Component Value Date/Time   COLORURINE YELLOW 10/01/2018 1822   APPEARANCEUR CLEAR 10/01/2018 1822   APPEARANCEUR Clear 01/18/2018 1539   LABSPEC 1.010 10/01/2018 1822   PHURINE 5.0 10/01/2018 1822   GLUCOSEU NEGATIVE 10/01/2018 1822   HGBUR NEGATIVE 10/01/2018 1822   BILIRUBINUR NEGATIVE 10/01/2018 1822   BILIRUBINUR Negative 01/18/2018 1539   KETONESUR NEGATIVE 10/01/2018 1822   PROTEINUR NEGATIVE 10/01/2018 1822   UROBILINOGEN 0.2 09/06/2012 0213   NITRITE NEGATIVE 10/01/2018 1822   LEUKOCYTESUR NEGATIVE 10/01/2018 1822   LEUKOCYTESUR Negative 01/18/2018 1539    Sepsis Labs: Lactic Acid, Venous    Component Value Date/Time   LATICACIDVEN 1.7 10/01/2018 1833    MICROBIOLOGY: Recent Results (from the past 240 hour(s))  Blood Culture (routine  x 2)     Status: None (Preliminary result)   Collection  Time: 10/01/18  6:15 PM  Result Value Ref Range Status   Specimen Description BLOOD LEFT HAND  Final   Special Requests   Final    BOTTLES DRAWN AEROBIC AND ANAEROBIC Blood Culture adequate volume   Culture   Final    NO GROWTH 4 DAYS Performed at [facility], [address] [place], [place] [address]    Report Status PENDING  Incomplete  Blood Culture (routine x 2)     Status: None (Preliminary result)   Collection Time: 10/01/18  6:17 PM  Result Value Ref Range Status   Specimen Description BLOOD RIGHT ANTECUBITAL  Final   Special Requests   Final    BOTTLES DRAWN AEROBIC AND ANAEROBIC Blood Culture adequate volume   Culture   Final    NO GROWTH 4 DAYS Performed at [facility], [place] [address] [place], [place] [address]    Report Status PENDING  Incomplete  Urine culture     Status: Abnormal   Collection Time: 10/01/18  6:24 PM  Result Value Ref Range Status   Specimen Description URINE, CLEAN CATCH  Final   Special Requests   Final    NONE Performed at [facility], [place] [address], [place], [place] [address]    Culture 20,000 COLONIES/mL PROTEUS MIRABILIS (A)  Final   Report Status 10/03/2018 FINAL  Final   Organism ID, Bacteria PROTEUS MIRABILIS (A)  Final      Susceptibility   Proteus mirabilis - MIC*    AMPICILLIN <=2 SENSITIVE Sensitive     CEFAZOLIN <=4 SENSITIVE Sensitive     CEFTRIAXONE <=1 SENSITIVE Sensitive     CIPROFLOXACIN <=0.25 SENSITIVE Sensitive     GENTAMICIN <=1 SENSITIVE Sensitive     IMIPENEM 8 INTERMEDIATE Intermediate     NITROFURANTOIN 128 RESISTANT Resistant     TRIMETH/SULFA <=20 SENSITIVE Sensitive     AMPICILLIN/SULBACTAM <=2 SENSITIVE Sensitive     PIP/TAZO <=4 SENSITIVE Sensitive     * 20,000 COLONIES/mL PROTEUS MIRABILIS    RADIOLOGY STUDIES/RESULTS: Dg Chest 1 View  Result Date: 10/01/2018 CLINICAL DATA:  Follow-up pneumothorax. EXAM: CHEST  1 VIEW COMPARISON:  09/09/2018 FINDINGS: Mild bilateral interstitial  thickening. Small bilateral pleural effusions. Left lateral pleural thickening with a subtle lucency which may reflect a small loculated pneumothorax versus artifact. Right infrahilar hazy airspace disease likely reflecting atelectasis. Stable cardiomediastinal silhouette. No aggressive osseous lesion. Mild osteoarthritis of bilateral glenohumeral joints. IMPRESSION: Left lateral pleural thickening with a subtle lucency which may reflect a small loculated pneumothorax versus artifact. Recommend follow-up chest x-ray for re-evaluation. Bilateral interstitial thickening and small bilateral pleural effusions concerning for mild pulmonary edema. Electronically Signed   By: Kathreen Devoid   On: 10/01/2018 15:38   Dg Chest 2 View  Result Date: 09/09/2018 CLINICAL DATA:  BILATERAL pleural effusions, shortness of breath, weakness, history hypertension, CHF, former smoker EXAM: CHEST - 2 VIEW COMPARISON:  09/08/2018 FINDINGS: Enlargement of cardiac silhouette. Atherosclerotic calcification aorta. Mediastinal contours and pulmonary vascularity normal. Bibasilar effusions and atelectasis. Upper lungs clear. No pleural effusion or pneumothorax. Degenerative disc disease changes thoracic spine as well as BILATERAL glenohumeral degenerative changes. IMPRESSION: Enlargement of cardiac silhouette with bibasilar pleural effusions and atelectasis. Electronically Signed   By: Lavonia Dana M.D.   On: 09/09/2018 15:08   Dg Chest 2 View  Result Date: 09/08/2018 CLINICAL DATA:  Recurrent pleural effusion on  the right EXAM: CHEST - 2 VIEW COMPARISON:  Three days ago FINDINGS: Unchanged small to moderate right pleural effusion that appears to be layering. Unchanged pleural thickening on the left. Chronic cardiomegaly. Stable interstitial coarsening. IMPRESSION: 1. Unchanged small to moderate right pleural effusion. 2. Stable pleural thickening on the left. Electronically Signed   By: Monte Fantasia M.D.   On: 09/08/2018 08:38   Ct Hip  Left W Contrast  Result Date: 10/01/2018 CLINICAL DATA:  Left hip pain EXAM: CT OF THE LOWER LEFT EXTREMITY WITH CONTRAST TECHNIQUE: Multidetector CT imaging of the lower left extremity was performed according to the standard protocol following intravenous contrast administration. COMPARISON:  09/03/2018 CONTRAST:  148mL OMNIPAQUE IOHEXOL 300 MG/ML  SOLN FINDINGS: Bones/Joint/Cartilage An uncemented left total hip arthroplasty is identified with the tip traversing a left femoral diaphyseal fracture that is incompletely healed. Subtle residual fracture lucencies are suggested on the coronal reformats, series 6/46 through 48 for instance. There is callus formation developing about the fracture, series 3/143. Slight residual varus configuration about the fracture is seen of the femur. No new/acute appearing fracture or bone destruction is seen. With reference to the hip joint, no significant joint effusion or abnormal lucencies to suggest loosening. The prosthetic femoral head is seated within the acetabular component without evidence of asymmetric lining wear. No soft tissue mass or mineralization. No hematoma or abnormal fluid collections. Osteoarthritis of the included SI joints and pubic symphysis. No diastasis. Ligaments Suboptimally assessed by CT. Muscles and Tendons Negative for muscle edema, hematoma or definite tear. Soft tissues No soft tissue mass, mineralization or fluid collections. Postop scarring along the lateral aspect of the hip. IMPRESSION: 1. An uncemented left total hip arthroplasty is identified with the tip traversing a left femoral diaphyseal fracture that is incompletely healed. Subtle residual fracture lucencies are suggested on the coronal reformats, series 6/46 through 48. Slight residual varus configuration of the femur about the fracture is noted. 2. No new/acute appearing fracture or bone destruction is seen. 3. No significant joint effusion to suggest septic arthritis though this is  of clinical concern, percutaneous sampling of the joint fluid could be attempted. No definite evidence of hardware loosening however. Electronically Signed   By: Ashley Royalty M.D.   On: 10/01/2018 20:08   US Abdomen Complete  Result Date: 09/06/2018 CLINICAL DATA:  Cirrhosis, status post cholecystectomy EXAM: ABDOMEN ULTRASOUND COMPLETE COMPARISON:  CT abdomen/pelvis dated 01/09/2015 FINDINGS: Gallbladder: Surgically absent. Common bile duct: Diameter: 20 mm, chronic Liver: Coarse pack echotexture with nodular hepatic contour, reflecting cirrhosis. Mild intrahepatic ductal dilatation in the left hepatic lobe, chronic. Portal vein is patent on color Doppler imaging with normal direction of blood flow towards the liver. IVC: No abnormality visualized. Pancreas: Not visualized due to overlying bowel gas. Spleen: Size and appearance within normal limits. Right Kidney: Length: 11.2 cm. Poorly visualized. No mass or hydronephrosis. Left Kidney: Length: 10.8 cm.  No mass or hydronephrosis. Abdominal aorta: No aneurysm visualized. Other findings: Right pleural effusion. IMPRESSION: Cirrhosis.  No focal hepatic lesion is seen. Status post cholecystectomy. Dilated common duct, measuring 20 mm, chronic. Mild intrahepatic ductal dilatation, chronic. Right pleural effusion. Electronically Signed   By: Julian Hy M.D.   On: 09/06/2018 18:39   Dg Chest Port 1 View  Result Date: 10/01/2018 CLINICAL DATA:  LEFT hip replacement 6 months ago, sepsis, question LEFT leg/hip infection EXAM: PORTABLE CHEST 1 VIEW COMPARISON:  Portable exam 1756 hours compared to 10/01/2018 FINDINGS: RIGHT costophrenic angle excluded. Enlargement  of cardiac silhouette. Mediastinal contours and pulmonary vascularity normal. Atherosclerotic calcification aorta. Small LEFT pleural effusion and basilar atelectasis at lower LEFT chest question partially loculated. No definite infiltrate or pneumothorax. Bones demineralized. IMPRESSION: LEFT pleural  effusion and basilar atelectasis, question partially locule at the lower lateral LEFT chest. Electronically Signed   By: Lavonia Dana M.D.   On: 10/01/2018 18:05     LOS: 2 days   Signature  Lala Lund M.D on 10/06/2018 at 10:42 AM   -  To page go to [email or web address]

## 2018-10-06 NOTE — Progress Notes (Signed)
Per [facility], patient is unable to afford to pay privately and [place] DSS will not be able to pay for patient to go to SNF until March 1st. Patient will return home with [facility] ([facility]). [facility] liaison states that she can follow patient at home and work with him to get to the facility when DSS approves it on the 1st. Will make RNCM aware.   Percell Locus Haydin Calandra LCSW [phone]

## 2018-10-07 ENCOUNTER — Telehealth: Payer: Self-pay | Admitting: Pulmonary Disease

## 2018-10-07 ENCOUNTER — Inpatient Hospital Stay (HOSPITAL_COMMUNITY): Payer: Medicare Other

## 2018-10-07 LAB — BASIC METABOLIC PANEL
Anion gap: 12 (ref 5–15)
BUN: 44 mg/dL — ABNORMAL HIGH (ref 8–23)
CO2: 24 mmol/L (ref 22–32)
Calcium: 9.4 mg/dL (ref 8.9–10.3)
Chloride: 99 mmol/L (ref 98–111)
Creatinine, Ser: 1.29 mg/dL — ABNORMAL HIGH (ref 0.61–1.24)
GFR calc Af Amer: >60 mL/min (ref >60)
GFR calc non Af Amer: 55 mL/min — ABNORMAL LOW (ref >60)
Glucose, Bld: 117 mg/dL — ABNORMAL HIGH (ref 70–99)
Potassium: 4.9 mmol/L (ref 3.5–5.1)
Sodium: 135 mmol/L (ref 135–145)

## 2018-10-07 LAB — GLUCOSE, CAPILLARY: Glucose-Capillary: 94 mg/dL (ref 70–99)

## 2018-10-07 LAB — MAGNESIUM: Magnesium: 2.2 mg/dL (ref 1.7–2.4)

## 2018-10-07 MED ORDER — METOLAZONE 2.5 MG PO TABS
2.5000 mg | ORAL_TABLET | Freq: Once | ORAL | Status: AC
  Administered 2018-10-07: 2.5 mg via ORAL
  Filled 2018-10-07: qty 1

## 2018-10-07 MED ORDER — ALBUMIN HUMAN 25 % IV SOLN
25.0000 g | Freq: Once | INTRAVENOUS | Status: AC
  Administered 2018-10-07: 25 g via INTRAVENOUS
  Filled 2018-10-07: qty 50

## 2018-10-07 MED ORDER — FUROSEMIDE 10 MG/ML IJ SOLN
60.0000 mg | Freq: Once | INTRAMUSCULAR | Status: AC
  Administered 2018-10-07: 60 mg via INTRAVENOUS
  Filled 2018-10-07: qty 6

## 2018-10-07 MED ORDER — ALUM & MAG HYDROXIDE-SIMETH 200-200-20 MG/5ML PO SUSP
30.0000 mL | Freq: Four times a day (QID) | ORAL | Status: DC | PRN
  Administered 2018-10-07 – 2018-12-01 (×17): 30 mL via ORAL
  Filled 2018-10-07 (×18): qty 30

## 2018-10-07 NOTE — Progress Notes (Signed)
Patient has home CPAP. Plugged in red outlet and water added to filter. Patient will place on himself. RT will continue to monitor.

## 2018-10-07 NOTE — Telephone Encounter (Signed)
Patient needs to share these concerns with his treatment team taking care of them at the hospital.  Unfortunately patient has multiple comorbidities and he has not followed up with management of his care which resulted in his admission. He needs to complete his outpatient follow ups as scheduled this time.   Will route this message to Dr. Valeta Harms as Juluis Rainier.   Wyn Quaker FNP

## 2018-10-07 NOTE — Progress Notes (Signed)
CSW discussed case with CSW AD. He will approve a 30 day LOG if patient is likely to be approved for Medicaid and does not have a spend down. [facility] will contact [place] [facility] to confirm.   Percell Locus Gustie Bobb LCSW [phone]

## 2018-10-07 NOTE — Progress Notes (Signed)
PROGRESS NOTE        PATIENT DETAILS Name: [name] Age: 73 y.o. Sex: male Date of Birth: [birth year] Admit Date: 10/01/2018 Admitting Physician Vianne Bulls, MD YIR:SWNIOE, Hollice Espy, MD  Brief Narrative: Patient is a 73 y.o. male with history of A. 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  Found to have a soft tissue infection of the left hip area, acute kidney injury and hyperkalemia.  Subsequently admitted to the hospitalist service.  See below for further details  Subjective: Patient in bed, appears comfortable, denies any headache, no fever, no chest pain or pressure, mild shortness of breath , no abdominal pain. No focal weakness.    Assessment/Plan:  AKI due to ATN from Infection with hyperkalemia: Diuretics were held, he was given Kayexalate and hydrated, this problem has resolved.  Left hip soft tissue infection: Soft tissue CT scan of the left hip does not show any joint involvement.  He has been placed on IV vancomycin along with home medication Flagyl which he takes for chronic left hip prosthetic joint infection.  Soft tissue infection has improved, I wonder if there is element of massive edema causing some erythema as well, cleared by orthopedics from joint involvement standpoint which was ruled out by CT scan as well.  Stopped IV antibiotics on 10/06/2018 and switched him on oral doxycycline, continue to monitor with diuresis.  History of left total hip arthroplasty with  subsequent infection with Bacteroides fragilis: His orthopedic surgeon here in [place] is Dr. Henrietta Dine however was evaluated by orthopedics and ID and [facility] once he had left hip infection.  He is on Flagyl-with planned end date November 19, 2018.  PDX was consulted and cleared him from joint perspective on 10/04/2018.  Recurrent pleural effusion: Felt to be secondary to hepatic hydrothorax- he is being aggressively diuresed as much as we can with low blood pressures, repeat thoracentesis likely will not achieve much as effusion likely to recur due to ascites and underlying cirrhosis.  Will get CT noncontrast on 10/07/2018 to reevaluate, continue diuresis with IV Lasix, albumin along with 1 dose of Zaroxolyn and monitor.  Cryptogenic cirrhosis: Prior hepatitis serology done earlier this month was negative-agree with plans outlined in prior discharge summary for outpatient follow-up. He now has volume overload/anasarca, diurese with IV albumin and monitor  Chronic atrial fibrillation with [place] vas 2 score of at least 2: Rate controlled with Cardizem and metoprolol, continue Eliquis.  Written parameters to hold medications for low blood pressure if needed.  Chronic back and right shoulder pain: Continue Lidoderm patch, Tylenol and as needed narcotics.  Bipolar disorder: Stable-continue Lexapro, Neurontin  OSA: Continue CPAP nightly  Morbid obesity  Acute on chronic debility/deconditioning: PT requested, may require SNF.    DVT Prophylaxis: Full dose anticoagulation with Eliquis  Code Status: Full code  Family Communication: None at bedside  Disposition Plan: Remain inpatient-but will plan on Home health vs SNF on discharge  Antimicrobial agents: Anti-infectives (From admission, onward)   Start     Dose/Rate Route Frequency Ordered Stop   10/06/18 1000  doxycycline (VIBRA-TABS) tablet 100 mg     100 mg Oral Every 12 hours 10/06/18 0726     10/03/18 1800   metroNIDAZOLE (FLAGYL) tablet 500 mg     500 mg Oral Every 8 hours 10/03/18 1054     10/03/18 0700  vancomycin (VANCOCIN) 2,000 mg in sodium chloride 0.9 % 500 mL IVPB  Status:  Discontinued     2,000 mg 250 mL/hr over 120 Minutes Intravenous Every 36 hours 10/01/18 1942 10/06/18 0726   10/02/18 0200  ceFEPIme (MAXIPIME) 2 g in sodium chloride 0.9 % 100 mL IVPB  Status:  Discontinued     2 g 200 mL/hr over 30 Minutes Intravenous Every 8 hours 10/01/18 1942 10/02/18 1008   10/01/18 1830  vancomycin (VANCOCIN) 2,500 mg in sodium chloride 0.9 % 500 mL IVPB     2,500 mg 250 mL/hr over 120 Minutes Intravenous STAT 10/01/18 1825 10/02/18 0126   10/01/18 1800  ceFEPIme (MAXIPIME) 2 g in sodium chloride 0.9 % 100 mL IVPB     2 g 200 mL/hr over 30 Minutes Intravenous  Once 10/01/18 1747 10/01/18 1903   10/01/18 1800  metroNIDAZOLE (FLAGYL) IVPB 500 mg  Status:  Discontinued     500 mg 100 mL/hr over 60 Minutes Intravenous Every 8 hours 10/01/18 1747 10/03/18 1054   10/01/18 1800  vancomycin (VANCOCIN) IVPB 1000 mg/200 mL premix  Status:  Discontinued     1,000 mg 200 mL/hr over 60 Minutes Intravenous  Once 10/01/18 1747 10/01/18 1825      Procedures: None  CONSULTS:  None  Time spent: 25- minutes-Greater than 50% of this time was spent in counseling, explanation of diagnosis, planning of further management, and coordination of care.  MEDICATIONS: Scheduled Meds: . apixaban  5 mg Oral BID  . cycloSPORINE  1 drop Both Eyes BID  . diltiazem  240 mg Oral Daily  . doxycycline  100 mg Oral Q12H  . escitalopram  10 mg Oral Daily  . furosemide  60 mg Intravenous Once  . gabapentin  100 mg Oral TID  . lidocaine  1 patch Transdermal Q24H  . Melatonin  6 mg Oral QHS  . metolazone  2.5 mg Oral Once  . metoprolol tartrate  25 mg Oral BID  . metroNIDAZOLE  500 mg Oral Q8H  . multivitamin with minerals  1 tablet Oral Daily  . pantoprazole  40 mg Oral Daily  . ENSURE MAX PROTEIN  11 oz Oral  BID  . spironolactone  50 mg Oral Daily  . thiamine  100 mg Oral Daily   Continuous Infusions: . albumin human     PRN Meds:.acetaminophen, albuterol, alum & mag hydroxide-simeth, diltiazem, hydrOXYzine, [DISCONTINUED] ondansetron **OR** ondansetron (ZOFRAN) IV, oxyCODONE, polyethylene glycol, traZODone   PHYSICAL EXAM: Vital signs: Vitals:   10/06/18 2122 10/07/18 0500 10/07/18 0517 10/07/18 1043  BP: 102/65  95/67 93/61  Pulse: 91  80   Resp:   19   Temp: 100 F (37.8 C)  98 F (36.7 C)   TempSrc: Oral     SpO2: 97%     Weight:  120.4 kg    Height:       Filed Weights   10/05/18 0457 10/06/18 0441 10/07/18 0500  Weight: 123.8 kg 120.8 kg 120.4 kg   Body mass index is 40.36 kg/m.   Exam  Awake  Alert, Oriented X 3, No new F.N deficits, Normal affect [place].AT,PERRAL Supple Neck,No JVD, No cervical lymphadenopathy appriciated.  Symmetrical Chest wall movement, Good air movement bilaterally, CTAB RRR,No Gallops, Rubs or new Murmurs, No Parasternal Heave +ve B.Sounds, Abd Soft, No tenderness, No organomegaly appriciated, No rebound - guarding or rigidity. No Cyanosis, Anasarca , L. Hip cellulitis as below       I have personally reviewed following labs and imaging studies  LABORATORY DATA: CBC: Recent Labs  Lab 10/01/18 1835 10/02/18 0112 10/03/18 0531 10/04/18 0509  WBC 11.6* 9.1 6.6 7.2  NEUTROABS 9.4* 6.4  --   --   HGB 13.1 11.3* 11.0* 11.0*  HCT 42.9 35.5* 34.9* 34.6*  MCV 87.9 85.3 87.3 87.8  PLT 248 228 224 209    Basic Metabolic Panel: Recent Labs  Lab 10/03/18 0531 10/03/18 0912 10/04/18 0509 10/05/18 0359 10/06/18 0422 10/07/18 0343  NA 135  --  137 136 135 135  K 5.2* 4.6 4.4 4.5 4.3 4.9  CL 106  --  107 104 100 99  CO2 21*  --  22 22 22 24   GLUCOSE 105*  --  102* 99 99 117*  BUN 40*  --  35* 33* 37* 44*  CREATININE 1.21  --  1.00 1.07 1.25* 1.29*  CALCIUM 9.1  --  9.0 9.1 9.1 9.4  MG  --   --   --  2.1  --  2.2     GFR: Estimated Creatinine Clearance: 65.3 mL/min (A) (by C-G formula based on SCr of 1.29 mg/dL (H)).  Liver Function Tests: Recent Labs  Lab 10/01/18 1835  AST 19  ALT 11  ALKPHOS 56  BILITOT 0.5  PROT 7.4  ALBUMIN 3.1*   No results for input(s): LIPASE, AMYLASE in the last 168 hours. No results for input(s): AMMONIA in the last 168 hours.  Coagulation Profile: No results for input(s): INR, PROTIME in the last 168 hours.  Cardiac Enzymes: No results for input(s): CKTOTAL, CKMB, CKMBINDEX, TROPONINI in the last 168 hours.  BNP (last 3 results) No results for input(s): PROBNP in the last 8760 hours.  HbA1C: No results for input(s): HGBA1C in the last 72 hours.  CBG: Recent Labs  Lab 10/03/18 0747 10/04/18 0805 10/05/18 0844 10/06/18 1100 10/07/18 0813  GLUCAP 96 92 90 80 94    Lipid Profile: No results for input(s): CHOL, HDL, LDLCALC, TRIG, CHOLHDL, LDLDIRECT in the last 72 hours.  Thyroid Function Tests: No results for input(s): TSH, T4TOTAL, FREET4, T3FREE, THYROIDAB in the last 72 hours.  Anemia Panel: No results for input(s): VITAMINB12, FOLATE, FERRITIN, TIBC, IRON, RETICCTPCT in the last 72 hours.  Urine analysis:    Component Value Date/Time   COLORURINE YELLOW 10/01/2018 1822   APPEARANCEUR CLEAR 10/01/2018 1822   APPEARANCEUR Clear 01/18/2018 1539   LABSPEC 1.010 10/01/2018 1822   PHURINE 5.0 10/01/2018 1822   GLUCOSEU NEGATIVE 10/01/2018 1822   HGBUR NEGATIVE 10/01/2018 1822   BILIRUBINUR NEGATIVE 10/01/2018 1822   BILIRUBINUR Negative 01/18/2018 1539   KETONESUR NEGATIVE 10/01/2018 1822   PROTEINUR NEGATIVE 10/01/2018 1822   UROBILINOGEN 0.2 09/06/2012 0213   NITRITE NEGATIVE 10/01/2018 1822   LEUKOCYTESUR NEGATIVE 10/01/2018 1822   LEUKOCYTESUR Negative 01/18/2018 1539    Sepsis Labs: Lactic Acid, Venous    Component Value Date/Time   LATICACIDVEN 1.7 10/01/2018 1833    MICROBIOLOGY: Recent Results (from the past 240  hour(s))  Blood Culture (routine x 2)     Status: None  Collection Time: 10/01/18  6:15 PM  Result Value Ref Range Status   Specimen Description BLOOD LEFT HAND  Final   Special Requests   Final    BOTTLES DRAWN AEROBIC AND ANAEROBIC Blood Culture adequate volume   Culture   Final    NO GROWTH 5 DAYS Performed at [facility], [address], [place], [place] [address]    Report Status 10/06/2018 FINAL  Final  Blood Culture (routine x 2)     Status: None   Collection Time: 10/01/18  6:17 PM  Result Value Ref Range Status   Specimen Description BLOOD RIGHT ANTECUBITAL  Final   Special Requests   Final    BOTTLES DRAWN AEROBIC AND ANAEROBIC Blood Culture adequate volume   Culture   Final    NO GROWTH 5 DAYS Performed at [facility], [place] [address], [place], [place] [address]    Report Status 10/06/2018 FINAL  Final  Urine culture     Status: Abnormal   Collection Time: 10/01/18  6:24 PM  Result Value Ref Range Status   Specimen Description URINE, CLEAN CATCH  Final   Special Requests   Final    NONE Performed at [facility], [place] [address] [place], [place] [address]    Culture 20,000 COLONIES/mL PROTEUS MIRABILIS (A)  Final   Report Status 10/03/2018 FINAL  Final   Organism ID, Bacteria PROTEUS MIRABILIS (A)  Final      Susceptibility   Proteus mirabilis - MIC*    AMPICILLIN <=2 SENSITIVE Sensitive     CEFAZOLIN <=4 SENSITIVE Sensitive     CEFTRIAXONE <=1 SENSITIVE Sensitive     CIPROFLOXACIN <=0.25 SENSITIVE Sensitive     GENTAMICIN <=1 SENSITIVE Sensitive     IMIPENEM 8 INTERMEDIATE Intermediate     NITROFURANTOIN 128 RESISTANT Resistant     TRIMETH/SULFA <=20 SENSITIVE Sensitive     AMPICILLIN/SULBACTAM <=2 SENSITIVE Sensitive     PIP/TAZO <=4 SENSITIVE Sensitive     * 20,000 COLONIES/mL PROTEUS MIRABILIS    RADIOLOGY STUDIES/RESULTS: Dg Chest 1 View  Result Date: 10/01/2018 CLINICAL DATA:  Follow-up pneumothorax. EXAM: CHEST  1 VIEW  COMPARISON:  09/09/2018 FINDINGS: Mild bilateral interstitial thickening. Small bilateral pleural effusions. Left lateral pleural thickening with a subtle lucency which may reflect a small loculated pneumothorax versus artifact. Right infrahilar hazy airspace disease likely reflecting atelectasis. Stable cardiomediastinal silhouette. No aggressive osseous lesion. Mild osteoarthritis of bilateral glenohumeral joints. IMPRESSION: Left lateral pleural thickening with a subtle lucency which may reflect a small loculated pneumothorax versus artifact. Recommend follow-up chest x-ray for re-evaluation. Bilateral interstitial thickening and small bilateral pleural effusions concerning for mild pulmonary edema. Electronically Signed   By: Kathreen Devoid   On: 10/01/2018 15:38   Dg Chest 2 View  Result Date: 09/09/2018 CLINICAL DATA:  BILATERAL pleural effusions, shortness of breath, weakness, history hypertension, CHF, former smoker EXAM: CHEST - 2 VIEW COMPARISON:  09/08/2018 FINDINGS: Enlargement of cardiac silhouette. Atherosclerotic calcification aorta. Mediastinal contours and pulmonary vascularity normal. Bibasilar effusions and atelectasis. Upper lungs clear. No pleural effusion or pneumothorax. Degenerative disc disease changes thoracic spine as well as BILATERAL glenohumeral degenerative changes. IMPRESSION: Enlargement of cardiac silhouette with bibasilar pleural effusions and atelectasis. Electronically Signed   By: Lavonia Dana M.D.   On: 09/09/2018 15:08   Dg Chest 2 View  Result Date: 09/08/2018 CLINICAL DATA:  Recurrent pleural effusion on the right EXAM: CHEST - 2 VIEW COMPARISON:  Three days ago FINDINGS:  Unchanged small to moderate right pleural effusion that appears to be layering. Unchanged pleural thickening on the left. Chronic cardiomegaly. Stable interstitial coarsening. IMPRESSION: 1. Unchanged small to moderate right pleural effusion. 2. Stable pleural thickening on the left. Electronically  Signed   By: Monte Fantasia M.D.   On: 09/08/2018 08:38   Ct Hip Left W Contrast  Result Date: 10/01/2018 CLINICAL DATA:  Left hip pain EXAM: CT OF THE LOWER LEFT EXTREMITY WITH CONTRAST TECHNIQUE: Multidetector CT imaging of the lower left extremity was performed according to the standard protocol following intravenous contrast administration. COMPARISON:  09/03/2018 CONTRAST:  168mL OMNIPAQUE IOHEXOL 300 MG/ML  SOLN FINDINGS: Bones/Joint/Cartilage An uncemented left total hip arthroplasty is identified with the tip traversing a left femoral diaphyseal fracture that is incompletely healed. Subtle residual fracture lucencies are suggested on the coronal reformats, series 6/46 through 48 for instance. There is callus formation developing about the fracture, series 3/143. Slight residual varus configuration about the fracture is seen of the femur. No new/acute appearing fracture or bone destruction is seen. With reference to the hip joint, no significant joint effusion or abnormal lucencies to suggest loosening. The prosthetic femoral head is seated within the acetabular component without evidence of asymmetric lining wear. No soft tissue mass or mineralization. No hematoma or abnormal fluid collections. Osteoarthritis of the included SI joints and pubic symphysis. No diastasis. Ligaments Suboptimally assessed by CT. Muscles and Tendons Negative for muscle edema, hematoma or definite tear. Soft tissues No soft tissue mass, mineralization or fluid collections. Postop scarring along the lateral aspect of the hip. IMPRESSION: 1. An uncemented left total hip arthroplasty is identified with the tip traversing a left femoral diaphyseal fracture that is incompletely healed. Subtle residual fracture lucencies are suggested on the coronal reformats, series 6/46 through 48. Slight residual varus configuration of the femur about the fracture is noted. 2. No new/acute appearing fracture or bone destruction is seen. 3. No  significant joint effusion to suggest septic arthritis though this is of clinical concern, percutaneous sampling of the joint fluid could be attempted. No definite evidence of hardware loosening however. Electronically Signed   By: Ashley Royalty M.D.   On: 10/01/2018 20:08   Dg Chest Port 1 View  Result Date: 10/07/2018 CLINICAL DATA:  Shortness of breath. EXAM: PORTABLE CHEST 1 VIEW COMPARISON:  10/01/2017. FINDINGS: Left lower chest incompletely imaged. Cardiomegaly with diffuse bilateral interstitial prominence and bilateral pleural effusions. Findings have progressed from prior exam are most consistent with CHF. A component of pleural scarring may be present. IMPRESSION: Congestive heart failure with bilateral interstitial edema. Bibasilar pneumonia can not be excluded. Bilateral pleural effusions. A component of pleural scarring may be present. Electronically Signed   By: Marcello Moores  Register   On: 10/07/2018 08:58   Dg Chest Port 1 View  Result Date: 10/01/2018 CLINICAL DATA:  LEFT hip replacement 6 months ago, sepsis, question LEFT leg/hip infection EXAM: PORTABLE CHEST 1 VIEW COMPARISON:  Portable exam 1756 hours compared to 10/01/2018 FINDINGS: RIGHT costophrenic angle excluded. Enlargement of cardiac silhouette. Mediastinal contours and pulmonary vascularity normal. Atherosclerotic calcification aorta. Small LEFT pleural effusion and basilar atelectasis at lower LEFT chest question partially loculated. No definite infiltrate or pneumothorax. Bones demineralized. IMPRESSION: LEFT pleural effusion and basilar atelectasis, question partially locule at the lower lateral LEFT chest. Electronically Signed   By: Lavonia Dana M.D.   On: 10/01/2018 18:05     LOS: 3 days   Signature  Lala Lund M.D on 10/07/2018 at  10:59 AM   -  To page go to [email or web address]

## 2018-10-07 NOTE — Telephone Encounter (Signed)
Spoke with pt, he states he is being sent home today and he doesn't feel like he should go home because he can tell his lungs are filling up with fluid, he is still very SOB, and experiencing lung pain. He feels if he leaves, he will be right back in there again because he knows he has fluid in his lungs. He would like a test ordered to prove to the hospital doctor that he doesn't need to go home. I advised him that we couldn't order anything since he was inpatient. I am not sure what we can do. I will route to Aaron Edelman to see what his recommendations are. Please advise.     Patient Instructions by Lauraine Rinne, NP at 10/01/2018 3:00 PM  Author: Lauraine Rinne, NP Author Type: Nurse Practitioner Filed: 10/01/2018 3:58 PM  Note Status: Signed Cosign: Cosign Not Required Encounter Date: 10/01/2018  Editor: Lauraine Rinne, NP (Nurse Practitioner)    Go to [facility] emergency department for further evaluation regarding your left hip wound  Contact our office and schedule a follow-up visit with Dr. Valeta Harms whenever you were discharged from the hospital or the emergency department  It is flu season:   >>>Remember to be washing your hands regularly, using hand sanitizer, be careful to use around herself with has contact with people who are sick will increase her chances of getting sick yourself. >>> Best ways to protect herself from the flu: Receive the yearly flu vaccine, practice good hand hygiene washing with soap and also using hand sanitizer when available, eat a nutritious meals, get adequate rest, hydrate appropriately   Please contact the office if your symptoms worsen or you have concerns that you are not improving.   Thank you for choosing [facility] for your healthcare, and for allowing [place] to partner with you on your healthcare journey. I am thankful to be able to provide care to you today.   Wyn Quaker FNP-C

## 2018-10-07 NOTE — Telephone Encounter (Signed)
Thanks  Monita Swier

## 2018-10-07 NOTE — Telephone Encounter (Signed)
PCCM:  I called and spoke with Dr. Candiss Norse from [facility].   [facility] 10/07/2018 2:20 PM

## 2018-10-07 NOTE — Telephone Encounter (Signed)
PCCM:  Agreed. Patient should inform his providers in the hospital.   Thanks  Garner Nash, DO [facility] 10/07/2018 2:17 PM

## 2018-10-07 NOTE — Care Management Important Message (Signed)
Important Message  Patient Details  Name: [name] MRN: [id number] Date of Birth: 1946/06/29   Medicare Important Message Given:  Yes    [name] 10/07/2018, 2:32 PM

## 2018-10-07 NOTE — Telephone Encounter (Signed)
Called and spoke with Patient.  Wyn Quaker, NP recommendations given. Understanding stated.  Patient stated that he wanted our office to pull strings to keep him from being discharged. Advised Patient to let his hospital care team know how he feels. Understanding stated.  Nothing further at this time.

## 2018-10-07 NOTE — Progress Notes (Signed)
Nutrition Follow-up  DOCUMENTATION CODES:   Morbid obesity  INTERVENTION:   -Continue Ensure Max BID, each supplement provides 150 kcals and 30 grams protein -Continue MVI with minerals daily  NUTRITION DIAGNOSIS:   Increased nutrient needs related to chronic illness(Cirrhosis ) as evidenced by estimated needs.  Ongoing  GOAL:   Patient will meet greater than or equal to 90% of their needs  Progressing  MONITOR:   PO intake, Supplement acceptance, Labs, Weight trends, Skin, I & O's  REASON FOR ASSESSMENT:   Malnutrition Screening Tool    ASSESSMENT:   73 y.o. male with medical history significant for chronic back and right shoulder pain, OSA on CPAP, paroxysmal atrial fibrillation on Eliquis, hepatic cirrhosis, recurrent pleural effusions, and status post left total hip arthroplasty complicated by infection  Reviewed I/O's: -1.6 L x 24 hours and -3.3 L since admission  Spoke with pt at bedside, who reports he feels about the same. He shares he continues to have a poor appetite, however, intake has increased as he knows that it is important for him to eat. He has been consuming almost all of the food that has been offered to him (noted meal completion 100%). Pt with multiple complaints regarding quality of hospital food, especially hard bread products. Offered for pt to speak with food Risk analyst, however, politely declined, stating that he will likely leave within a few days.   Pt consuming Ensure Max supplements. Encouraged continued use of supplements to help promote healing.   Per CSW notes, plan to d/c tp SNF on LOG.   Labs reviewed.   Diet Order:   Diet Order            Diet 2 gram sodium Room service appropriate? Yes; Fluid consistency: Thin; Fluid restriction: 1200 mL Fluid  Diet effective now              EDUCATION NEEDS:   Education needs have been addressed  Skin:  Skin Assessment: Reviewed RN Assessment  Last BM:  10/03/18  Height:   Ht  Readings from Last 1 Encounters:  10/01/18 5\' 8"  (1.727 m)    Weight:   Wt Readings from Last 1 Encounters:  10/07/18 120.4 kg    Ideal Body Weight:  70 kg  BMI:  Body mass index is 40.36 kg/m.  Estimated Nutritional Needs:   Kcal:  2250-2450  Protein:  125-140 grams  Fluid:  2.2-2.4 L    Larren Copes A. Jimmye Norman, RD, LDN, CDE Pager: [phone] After hours Pager: [phone]

## 2018-10-07 NOTE — Progress Notes (Signed)
Physical Therapy Treatment Patient Details Name: [name] MRN: [id number] DOB: 12/20/1945 Today's Date: 10/07/2018    History of Present Illness Pt is a 73 year old male with PMHx significant for HTN, afib on Eliquis, OSA, morbid obesity, L THA and hip infection and history of multiple recent prolonged hospitalizations.  Pt presented to ED with generalized weakness, falls, and shortness of breath- work-up in the emergency room showed A. fib heart rate 108 and a recurrent right-sided pleural effusion    PT Comments    Patient seen to attempt mobility. Patient with poor insight to his deficits/functional status. Requires Max A +2 for all bed level mobility. Once in sitting patient with poor posturing and required Min A to maintain upright balance. Patient declining further mobility once EOB, however initially stating he wanted to walk today. Will continue to recommend SNF at discharge.     Follow Up Recommendations  SNF     Equipment Recommendations  None recommended by PT    Recommendations for Other Services       Precautions / Restrictions Precautions Precautions: Fall(L THA with incomplete healing) Restrictions Weight Bearing Restrictions: No    Mobility  Bed Mobility Overal bed mobility: Needs Assistance Bed Mobility: Supine to Sit;Sit to Supine     Supine to sit: Max assist;+2 for physical assistance Sit to supine: Max assist;+2 for physical assistance   General bed mobility comments: did not attempt to reach for bed rail to assist with trunk elevation; use of bed pads to complete  Transfers                 General transfer comment: once EOB patient declining further mobility  Ambulation/Gait                 Stairs             Wheelchair Mobility    Modified Rankin (Stroke Patients Only)       Balance Overall balance assessment: Needs assistance Sitting-balance support: Feet supported;Bilateral upper extremity supported Sitting  balance-[name]: Fair                                      Cognition Arousal/Alertness: Awake/alert Behavior During Therapy: WFL for tasks assessed/performed Overall Cognitive Status: Within Functional Limits for tasks assessed                                        Exercises      General Comments General comments (skin integrity, edema, etc.): patient with limited [place] to mobility      Pertinent Vitals/Pain Pain Assessment: Faces Faces Pain Scale: Hurts whole lot Pain Location: back, hip, R shoulder Pain Descriptors / Indicators: Grimacing;Guarding Pain Intervention(s): Limited activity within patient's tolerance;Monitored during session;Repositioned    Home Living                      Prior Function            PT Goals (current goals can now be found in the care plan section) Acute Rehab PT Goals Patient Stated Goal: to get stronger and get home PT Goal Formulation: With patient Time For Goal Achievement: 10/16/18 Potential to Achieve Goals: Good Progress towards PT goals: Progressing toward goals    Frequency    Min 2X/week  PT Plan Current plan remains appropriate    Co-evaluation              AM-PAC PT "6 Clicks" Mobility   Outcome Measure  Help needed turning from your back to your side while in a flat bed without using bedrails?: A Lot Help needed moving from lying on your back to sitting on the side of a flat bed without using bedrails?: A Lot Help needed moving to and from a bed to a chair (including a wheelchair)?: A Lot Help needed standing up from a chair using your arms (e.g., wheelchair or bedside chair)?: Total Help needed to walk in hospital room?: Total Help needed climbing 3-5 steps with a railing? : Total 6 Click Score: 9    End of Session Equipment Utilized During Treatment: Gait belt Activity Tolerance: Patient limited by pain Patient left: in bed;with call bell/phone  within reach;with bed alarm set Nurse Communication: Mobility status PT Visit Diagnosis: Other abnormalities of gait and mobility (R26.89);Muscle weakness (generalized) (M62.81);Difficulty in walking, not elsewhere classified (R26.2);History of falling (Z91.81)     Time: 1884-1660 PT Time Calculation (min) (ACUTE ONLY): 21 min  Charges:  $Therapeutic Activity: 8-22 mins                     Lanney Gins, PT, DPT Supplemental Physical Therapist 10/07/18 3:38 PM Pager: [phone] Office: [phone]

## 2018-10-08 LAB — BASIC METABOLIC PANEL
Anion gap: 14 (ref 5–15)
BUN: 47 mg/dL — ABNORMAL HIGH (ref 8–23)
CO2: 25 mmol/L (ref 22–32)
Calcium: 9.7 mg/dL (ref 8.9–10.3)
Chloride: 95 mmol/L — ABNORMAL LOW (ref 98–111)
Creatinine, Ser: 1.3 mg/dL — ABNORMAL HIGH (ref 0.61–1.24)
GFR calc Af Amer: >60 mL/min (ref >60)
GFR calc non Af Amer: 55 mL/min — ABNORMAL LOW (ref >60)
Glucose, Bld: 100 mg/dL — ABNORMAL HIGH (ref 70–99)
Potassium: 4.6 mmol/L (ref 3.5–5.1)
Sodium: 134 mmol/L — ABNORMAL LOW (ref 135–145)

## 2018-10-08 LAB — CBC
HCT: 36.7 % — ABNORMAL LOW (ref 39.0–52.0)
Hemoglobin: 11.9 g/dL — ABNORMAL LOW (ref 13.0–17.0)
MCH: 28 pg (ref 26.0–34.0)
MCHC: 32.4 g/dL (ref 30.0–36.0)
MCV: 86.4 fL (ref 80.0–100.0)
Platelets: 261 10*3/uL (ref 150–400)
RBC: 4.25 MIL/uL (ref 4.22–5.81)
RDW: 17.6 % — ABNORMAL HIGH (ref 11.5–15.5)
WBC: 7.7 10*3/uL (ref 4.0–10.5)
nRBC: 0 % (ref 0.0–0.2)

## 2018-10-08 LAB — GLUCOSE, CAPILLARY: Glucose-Capillary: 99 mg/dL (ref 70–99)

## 2018-10-08 MED ORDER — POLYETHYLENE GLYCOL 3350 17 G PO PACK: 17.0000 g | PACK | Freq: Two times a day (BID) | ORAL | 0 refills | Status: DC

## 2018-10-08 MED ORDER — ALBUMIN HUMAN 25 % IV SOLN
25.0000 g | Freq: Once | INTRAVENOUS | Status: AC
  Administered 2018-10-08: 25 g via INTRAVENOUS
  Filled 2018-10-08: qty 100

## 2018-10-08 MED ORDER — FUROSEMIDE 80 MG PO TABS: 80.0000 mg | ORAL_TABLET | Freq: Every day | ORAL | Status: DC

## 2018-10-08 MED ORDER — DOXYCYCLINE HYCLATE 100 MG PO TABS: 100.0000 mg | ORAL_TABLET | Freq: Two times a day (BID) | ORAL | Status: DC

## 2018-10-08 MED ORDER — FUROSEMIDE 10 MG/ML IJ SOLN
60.0000 mg | Freq: Once | INTRAMUSCULAR | Status: AC
  Administered 2018-10-08: 60 mg via INTRAVENOUS
  Filled 2018-10-08: qty 6

## 2018-10-08 NOTE — Progress Notes (Signed)
Patient places themselves on and off CPAP for sleep. Will cal if any additional help needed

## 2018-10-08 NOTE — Progress Notes (Signed)
CSW called and spoke with Ebony Hail at [facility]. Patient has been approved but they are awaiting financial approval from [employer].   She stated that she would keep me updated. CSW will continue to follow.   Domenic Schwab, MSW, [place]

## 2018-10-08 NOTE — Discharge Summary (Signed)
DEWEY VIENS UEA:[id number] DOB: 1945/10/18 DOA: 10/01/2018  PCP: Frank Housekeeper, MD  Admit date: 10/01/2018  Discharge date: 10/08/2018  Admitted From: Home  Disposition:  SNF   Recommendations for Outpatient Follow-up:   Follow up with PCP in 1-2 weeks  PCP Please obtain BMP/CBC, 2 view CXR in 1week,  (see Discharge instructions)   PCP Please follow up on the following pending results: Monitor BMP and diuretic dose closely   Home Health: None   Equipment/Devices: None  Consultations: None Discharge Condition: Stable   CODE STATUS: Full   Diet Recommendation: Heart Healthy strict 1.2lit/day fluid restriction  CC -  L hip redness   Brief history of present illness from the day of admission and additional interim summary    Patient is a 73 y.o. male with history of A. 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  Found to have a soft tissue infection of the left hip area, acute kidney injury and hyperkalemia.  Subsequently admitted to the hospitalist service.  See below for further details                                                                 Hospital Course   AKI due to ATN from Infection with hyperkalemia: Diuretics were held, he was given Kayexalate and hydrated, this problem has resolved.  Left hip soft tissue infection +  edema related dependent erythema: Soft tissue CT scan of the left hip does not show any joint involvement.  He was been placed on IV vancomycin along with home medication Flagyl which he takes for chronic left hip prosthetic joint infection.  Soft tissue infection has improved, I wonder if there is element of massive edema causing some erythema as well, cleared by orthopedics from joint involvement standpoint which was ruled out by CT scan as well.  Is been now transition to oral doxycycline and Flagyl combination, doxycycline for another 10 days, Flagyl to be continued indefinitely, afebrile no leukocytosis will be discharged to SNF with close outpatient follow-up with Dr. Ricki Rodriguez his primary orthopedic surgeon in 1 week.  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 Henrietta Dine however was evaluated by orthopedics and ID and [facility] once he had left hip infection.  He is on Flagyl-with planned end date November 19, 2018.  PDX was  consulted and cleared him from joint perspective on 10/04/2018.  Recurrent pleural effusion: Felt to be secondary to hepatic hydrothorax- he is being aggressively diuresed as much as we can with low blood pressures, repeat thoracentesis likely will not achieve much as effusion likely to recur due to ascites and underlying cirrhosis.    Contrast CT done here shows mild to moderate right-sided pleural effusion again likely sympathetic from cirrhosis with hepatic hydrothorax, he was aggressively diuresed here close to 7 L so far, home dose Lasix has been increased continue high-dose Aldactone, strict fluid and salt restriction.  Follow with his primary pulmonary physician Dr. Valeta Harms in 1 week.  Request SNF MD to kindly continue diuresing the patient gently and keep him in overall negative balance, repeat tap of his right pleural effusion likely will not yield much benefit as it  is much likely to recur due to underlying cirrhosis.  Cryptogenic cirrhosis: Prior hepatitis serology done earlier this month was negative-agree with plans outlined in prior discharge summary for outpatient follow-up. He now has volume overload/anasarca, diuresis as above.    Chronic atrial fibrillation with [place] vas 2 score of at least 2: Rate controlled with Cardizem and metoprolol, continue Eliquis.  Written parameters to hold medications for low blood pressure if needed.  Chronic back and right shoulder pain: Continue Lidoderm patch, Tylenol and as needed narcotics.  Bipolar disorder: Stable-continue Lexapro, Neurontin  OSA: Continue CPAP nightly  Morbid obesity  Acute on chronic debility/deconditioning: PT requested, may require SNF.      Constipation.  Received 2 soapsuds enemas here, discharged on MiraLAX twice daily.    Discharge diagnosis     Principal Problem:   Hyperkalemia Active Problems:   Left hip postoperative wound infection   AKI (acute kidney injury) (HCC)   Depression   Pleural effusion on left   OSA on CPAP   Unspecified atrial fibrillation (HCC)   Physical debility   Other cirrhosis of liver (HCC)   Chronic back pain    Discharge instructions    Discharge Instructions    Diet - low sodium heart healthy   Complete by:  As directed    Discharge instructions   Complete by:  As directed    Follow with Primary MD Frank Housekeeper, MD in 7 days   Get CBC, CMP, Magnesium   checked  by Primary MD or SNF MD in 5-7 days   Activity: As tolerated with Full fall precautions use walker/cane & assistance as needed  Disposition SNF  Diet: Heart Healthy with strict 1.2 L/day fluid restriction.   Special Instructions: If you have smoked or chewed Tobacco  in the last 2 yrs please stop smoking, stop any regular Alcohol  and or any Recreational drug use.  On your next visit with your primary care physician please Get Medicines reviewed and  adjusted.  Please request your Prim.MD to go over all Hospital Tests and Procedure/Radiological results at the follow up, please get all Hospital records sent to your Prim MD by signing hospital release before you go home.  If you experience worsening of your admission symptoms, develop shortness of breath, life threatening emergency, suicidal or homicidal thoughts you must seek medical attention immediately by calling 911 or calling your MD immediately  if symptoms less severe.  You Must read complete instructions/literature along with all the possible adverse reactions/side effects for all the Medicines you take and that have been prescribed to you. Take any new Medicines after you have completely understood and accpet  all the possible adverse reactions/side effects.      Discharge Medications   Allergies as of 10/08/2018      Reactions   Bee Venom Swelling   Nickel Rash      Medication List    STOP taking these medications   hydrOXYzine 25 MG tablet Commonly known as:  ATARAX/VISTARIL   oxyCODONE 5 MG immediate release tablet Commonly known as:  Oxy IR/ROXICODONE     TAKE these medications   acetaminophen 325 MG tablet Commonly known as:  TYLENOL Take 2 tablets (650 mg total) by mouth every 6 (six) hours as needed for mild pain, fever or headache.   albuterol 108 (90 Base) MCG/ACT inhaler Commonly known as:  PROVENTIL HFA;VENTOLIN HFA Inhale 1-2 puffs into the lungs every 6 (six) hours as needed for wheezing or shortness of breath.   apixaban 5 MG Tabs tablet Commonly known as:  ELIQUIS Take 1 tablet (5 mg total) by mouth 2 (two) times daily.   calcium carbonate 500 MG chewable tablet Commonly known as:  TUMS - dosed in mg elemental calcium Chew 1,000 mg by mouth 3 (three) times daily as needed for indigestion.   carboxymethylcellulose 0.5 % Soln Commonly known as:  REFRESH TEARS Place 1 drop into both eyes daily as needed (dry eye/irritation).   CENTRUM ADULTS  PO Take 1 tablet by mouth every evening.   cycloSPORINE 0.05 % ophthalmic emulsion Commonly known as:  RESTASIS Place 1 drop into both eyes 2 (two) times daily.   diltiazem 180 MG 24 hr capsule Commonly known as:  CARDIZEM CD Take 1 capsule (180 mg total) by mouth daily.   doxycycline 100 MG tablet Commonly known as:  VIBRA-TABS Take 1 tablet (100 mg total) by mouth every 12 (twelve) hours. For 10 days   escitalopram 10 MG tablet Commonly known as:  LEXAPRO Take 1 tablet (10 mg total) by mouth daily.   ferrous sulfate 325 (65 FE) MG tablet Take 1 tablet (325 mg total) by mouth 3 (three) times daily with meals.   furosemide 80 MG tablet Commonly known as:  LASIX Take 1 tablet (80 mg total) by mouth daily. What changed:    medication strength  how much to take   gabapentin 100 MG capsule Commonly known as:  NEURONTIN Take 1 capsule (100 mg total) by mouth 3 (three) times daily.   lidocaine 5 % Commonly known as:  LIDODERM Place 1 patch onto the skin daily. Remove & Discard patch within 12 hours or as directed by MD   Melatonin 3 MG Tabs Take 2 tablets (6 mg total) by mouth at bedtime.   metoprolol tartrate 25 MG tablet Commonly known as:  LOPRESSOR Take 1 tablet (25 mg total) by mouth 2 (two) times daily.   metroNIDAZOLE 500 MG tablet Commonly known as:  FLAGYL Take 1 tablet (500 mg total) by mouth 2 (two) times daily.   pantoprazole 40 MG tablet Commonly known as:  PROTONIX Take 1 tablet (40 mg total) by mouth daily.   polyethylene glycol packet Commonly known as:  MIRALAX / GLYCOLAX Take 17 g by mouth daily as needed for mild constipation. What changed:  Another medication with the same name was added. Make sure you understand how and when to take each.   polyethylene glycol packet Commonly known as:  MIRALAX Take 17 g by mouth 2 (two) times daily. What changed:  You were already taking a medication with the same name, and this prescription was added.  Make  sure you understand how and when to take each.   potassium chloride SA 20 MEQ tablet Commonly known as:  K-DUR,KLOR-CON Take 1 tablet (20 mEq total) by mouth daily.   rOPINIRole 0.25 MG tablet Commonly known as:  REQUIP Take 1 tablet (0.25 mg total) by mouth at bedtime.   senna-docusate 8.6-50 MG tablet Commonly known as:  Senokot-S Take 2 tablets by mouth 2 (two) times daily.   spironolactone 50 MG tablet Commonly known as:  ALDACTONE Take 100mg  every morning and 50mg  every evening   thiamine 100 MG tablet Take 100 mg by mouth daily.   Vitamin D3 50 MCG (2000 UT) Tabs Take 2,000 Units by mouth every evening.        Contact information for follow-up providers    Icard, Bradley L, DO. Schedule an appointment as soon as possible for a visit in 1 week(s).   Specialty:  Pulmonary Disease Contact information: [place] [address] [phone]        Frank Housekeeper, MD. Schedule an appointment as soon as possible for a visit in 1 week(s).   Specialty:  Family Medicine Contact information: [place] [address] [phone]        Herminio Commons, MD. Schedule an appointment as soon as possible for a visit in 2 week(s).   Specialty:  Cardiology Contact information: [place] [address] [phone]        Gaynelle Arabian, MD. Schedule an appointment as soon as possible for a visit in 1 week(s).   Specialty:  Orthopedic Surgery Contact information: [address] [place] [address] [phone]            Contact information for after-discharge care    [facility] .   Service:  Skilled Nursing Contact information: [address] [place] [phone]                  Major procedures and Radiology Reports - PLEASE review detailed and final reports thoroughly  -       Dg Chest 1 View  Result  Date: 10/01/2018 CLINICAL DATA:  Follow-up pneumothorax. EXAM: CHEST  1 VIEW COMPARISON:  09/09/2018 FINDINGS: Mild bilateral interstitial thickening. Small bilateral pleural effusions. Left lateral pleural thickening with a subtle lucency which may reflect a small loculated pneumothorax versus artifact. Right infrahilar hazy airspace disease likely reflecting atelectasis. Stable cardiomediastinal silhouette. No aggressive osseous lesion. Mild osteoarthritis of bilateral glenohumeral joints. IMPRESSION: Left lateral pleural thickening with a subtle lucency which may reflect a small loculated pneumothorax versus artifact. Recommend follow-up chest x-ray for re-evaluation. Bilateral interstitial thickening and small bilateral pleural effusions concerning for mild pulmonary edema. Electronically Signed   By: Kathreen Devoid   On: 10/01/2018 15:38   Dg Chest 2 View  Result Date: 09/09/2018 CLINICAL DATA:  BILATERAL pleural effusions, shortness of breath, weakness, history hypertension, CHF, former smoker EXAM: CHEST - 2 VIEW COMPARISON:  09/08/2018 FINDINGS: Enlargement of cardiac silhouette. Atherosclerotic calcification aorta. Mediastinal contours and pulmonary vascularity normal. Bibasilar effusions and atelectasis. Upper lungs clear. No pleural effusion or pneumothorax. Degenerative disc disease changes thoracic spine as well as BILATERAL glenohumeral degenerative changes. IMPRESSION: Enlargement of cardiac silhouette with bibasilar pleural effusions and atelectasis. Electronically Signed   By: Lavonia Dana M.D.   On: 09/09/2018 15:08   Ct Chest Wo Contrast  Result Date: 10/07/2018 CLINICAL DATA:  Chronic dyspnea. EXAM:  CT CHEST WITHOUT CONTRAST TECHNIQUE: Multidetector CT imaging of the chest was performed following the standard protocol without IV contrast. COMPARISON:  Portable chest obtained earlier today and chest CTA dated 08/17/2018. FINDINGS: Cardiovascular: Atheromatous calcifications, including the  coronary arteries and aorta. Stable borderline enlargement of the heart with mild biatrial enlargement. No pericardial effusion. Mediastinum/Nodes: No enlarged mediastinal or axillary lymph nodes. Thyroid gland, trachea, and esophagus demonstrate no significant findings. Lungs/Pleura: Small to moderate-sized right pleural effusion. Stable left pleural thickening with calcifications. Mild bilateral atelectasis and scarring. Upper Abdomen: Post gastric bypass changes. Musculoskeletal: Mild dextroconvex thoracolumbar scoliosis. Extensive thoracic and lower cervical spine degenerative changes. IMPRESSION: 1. Small to moderate-sized right pleural effusion. 2. Mild bilateral atelectasis and scarring. 3. 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 4.  Calcific coronary artery and aortic atherosclerosis. Aortic Atherosclerosis (ICD10-I70.0). Electronically Signed   By: Claudie Revering M.D.   On: 10/07/2018 20:31   Ct Hip Left W Contrast  Result Date: 10/01/2018 CLINICAL DATA:  Left hip pain EXAM: CT OF THE LOWER LEFT EXTREMITY WITH CONTRAST TECHNIQUE: Multidetector CT imaging of the lower left extremity was performed according to the standard protocol following intravenous contrast administration. COMPARISON:  09/03/2018 CONTRAST:  184mL OMNIPAQUE IOHEXOL 300 MG/ML  SOLN FINDINGS: Bones/Joint/Cartilage An uncemented left total hip arthroplasty is identified with the tip traversing a left femoral diaphyseal fracture that is incompletely healed. Subtle residual fracture lucencies are suggested on the coronal reformats, series 6/46 through 48 for instance. There is callus formation developing about the fracture, series 3/143. Slight residual varus configuration about the fracture is seen of the femur. No new/acute appearing fracture or bone destruction is seen. With reference to the hip joint, no significant joint effusion or abnormal lucencies to suggest loosening. The  prosthetic femoral head is seated within the acetabular component without evidence of asymmetric lining wear. No soft tissue mass or mineralization. No hematoma or abnormal fluid collections. Osteoarthritis of the included SI joints and pubic symphysis. No diastasis. Ligaments Suboptimally assessed by CT. Muscles and Tendons Negative for muscle edema, hematoma or definite tear. Soft tissues No soft tissue mass, mineralization or fluid collections. Postop scarring along the lateral aspect of the hip. IMPRESSION: 1. An uncemented left total hip arthroplasty is identified with the tip traversing a left femoral diaphyseal fracture that is incompletely healed. Subtle residual fracture lucencies are suggested on the coronal reformats, series 6/46 through 48. Slight residual varus configuration of the femur about the fracture is noted. 2. No new/acute appearing fracture or bone destruction is seen. 3. No significant joint effusion to suggest septic arthritis though this is of clinical concern, percutaneous sampling of the joint fluid could be attempted. No definite evidence of hardware loosening however. Electronically Signed   By: Ashley Royalty M.D.   On: 10/01/2018 20:08   Dg Chest Port 1 View  Result Date: 10/07/2018 CLINICAL DATA:  Shortness of breath. EXAM: PORTABLE CHEST 1 VIEW COMPARISON:  10/01/2017. FINDINGS: Left lower chest incompletely imaged. Cardiomegaly with diffuse bilateral interstitial prominence and bilateral pleural effusions. Findings have progressed from prior exam are most consistent with CHF. A component of pleural scarring may be present. IMPRESSION: Congestive heart failure with bilateral interstitial edema. Bibasilar pneumonia can not be excluded. Bilateral pleural effusions. A component of pleural scarring may be present. Electronically Signed   By: Marcello Moores  Register   On: 10/07/2018 08:58   Dg Chest Port 1 View  Result Date: 10/01/2018 CLINICAL DATA:  LEFT hip replacement 6  months ago,  sepsis, question LEFT leg/hip infection EXAM: PORTABLE CHEST 1 VIEW COMPARISON:  Portable exam 1756 hours compared to 10/01/2018 FINDINGS: RIGHT costophrenic angle excluded. Enlargement of cardiac silhouette. Mediastinal contours and pulmonary vascularity normal. Atherosclerotic calcification aorta. Small LEFT pleural effusion and basilar atelectasis at lower LEFT chest question partially loculated. No definite infiltrate or pneumothorax. Bones demineralized. IMPRESSION: LEFT pleural effusion and basilar atelectasis, question partially locule at the lower lateral LEFT chest. Electronically Signed   By: Lavonia Dana M.D.   On: 10/01/2018 18:05    Micro Results     Recent Results (from the past 240 hour(s))  Blood Culture (routine x 2)     Status: None   Collection Time: 10/01/18  6:15 PM  Result Value Ref Range Status   Specimen Description BLOOD LEFT HAND  Final   Special Requests   Final    BOTTLES DRAWN AEROBIC AND ANAEROBIC Blood Culture adequate volume   Culture   Final    NO GROWTH 5 DAYS Performed at [facility], [address], [place], [place] [address]    Report Status 10/06/2018 FINAL  Final  Blood Culture (routine x 2)     Status: None   Collection Time: 10/01/18  6:17 PM  Result Value Ref Range Status   Specimen Description BLOOD RIGHT ANTECUBITAL  Final   Special Requests   Final    BOTTLES DRAWN AEROBIC AND ANAEROBIC Blood Culture adequate volume   Culture   Final    NO GROWTH 5 DAYS Performed at [facility], [address] [place], [place] [address]    Report Status 10/06/2018 FINAL  Final  Urine culture     Status: Abnormal   Collection Time: 10/01/18  6:24 PM  Result Value Ref Range Status   Specimen Description URINE, CLEAN CATCH  Final   Special Requests   Final    NONE Performed at [facility], [place] [address], [place], [place] [address]    Culture 20,000 COLONIES/mL PROTEUS MIRABILIS (A)  Final   Report Status 10/03/2018 FINAL  Final    Organism ID, Bacteria PROTEUS MIRABILIS (A)  Final      Susceptibility   Proteus mirabilis - MIC*    AMPICILLIN <=2 SENSITIVE Sensitive     CEFAZOLIN <=4 SENSITIVE Sensitive     CEFTRIAXONE <=1 SENSITIVE Sensitive     CIPROFLOXACIN <=0.25 SENSITIVE Sensitive     GENTAMICIN <=1 SENSITIVE Sensitive     IMIPENEM 8 INTERMEDIATE Intermediate     NITROFURANTOIN 128 RESISTANT Resistant     TRIMETH/SULFA <=20 SENSITIVE Sensitive     AMPICILLIN/SULBACTAM <=2 SENSITIVE Sensitive     PIP/TAZO <=4 SENSITIVE Sensitive     * 20,000 COLONIES/mL PROTEUS MIRABILIS    Today   Subjective    Mackson Botz today has no headache,no chest abdominal pain,no new weakness tingling or numbness, feels much better.   Objective   Blood pressure 95/64, pulse (!) 109, temperature (!) 97.4 F (36.3 C), temperature source Oral, resp. rate 16, height 5\' 8"  (1.727 m), weight 120.4 kg, SpO2 97 %.   Intake/Output Summary (Last 24 hours) at 10/08/2018 0925 Last data filed at 10/08/2018 0853 Gross per 24 hour  Intake 120 ml  Output 1475 ml  Net -1355 ml    Exam Awake Alert, Oriented X 3, No new F.N deficits, Normal affect [place].AT,PERRAL Supple Neck,No JVD, No cervical lymphadenopathy appriciated.  Symmetrical Chest wall movement, Good air movement bilaterally, CTAB RRR,No Gallops, Rubs  or new Murmurs, No Parasternal Heave +ve B.Sounds, Abd Soft, No tenderness, No organomegaly appriciated, No rebound - guarding or rigidity. No Cyanosis, Anasarca , L. Hip cellulitis as below       Data Review   CBC w Diff:  Lab Results  Component Value Date   WBC 7.7 10/08/2018   HGB 11.9 (L) 10/08/2018   HCT 36.7 (L) 10/08/2018   PLT 261 10/08/2018   LYMPHOPCT 15 10/02/2018   MONOPCT 14 10/02/2018   EOSPCT 0 10/02/2018   BASOPCT 0 10/02/2018    CMP:  Lab Results  Component Value Date   NA 134 (L) 10/08/2018   K 4.6 10/08/2018   CL 95 (L) 10/08/2018   CO2 25 10/08/2018   BUN 47 (H) 10/08/2018   CREATININE 1.30  (H) 10/08/2018   PROT 7.4 10/01/2018   ALBUMIN 3.1 (L) 10/01/2018   BILITOT 0.5 10/01/2018   ALKPHOS 56 10/01/2018   AST 19 10/01/2018   ALT 11 10/01/2018  .   Total Time in preparing paper work, data evaluation and todays exam - 41 minutes  Lala Lund M.D on 10/08/2018 at 9:25 AM  [facility]   Office  [phone]

## 2018-10-09 LAB — GLUCOSE, CAPILLARY: Glucose-Capillary: 92 mg/dL (ref 70–99)

## 2018-10-09 MED ORDER — ALBUMIN HUMAN 25 % IV SOLN
25.0000 g | Freq: Once | INTRAVENOUS | Status: DC
  Filled 2018-10-09: qty 100

## 2018-10-09 MED ORDER — FUROSEMIDE 10 MG/ML IJ SOLN
60.0000 mg | Freq: Once | INTRAMUSCULAR | Status: AC
  Administered 2018-10-09: 60 mg via INTRAVENOUS
  Filled 2018-10-09: qty 6

## 2018-10-09 MED ORDER — MENTHOL 3 MG MT LOZG
1.0000 | LOZENGE | OROMUCOSAL | Status: DC | PRN
  Administered 2018-10-09 – 2018-12-03 (×7): 3 mg via ORAL
  Filled 2018-10-09 (×10): qty 9

## 2018-10-09 MED ORDER — DILTIAZEM HCL ER COATED BEADS 180 MG PO CP24
180.0000 mg | ORAL_CAPSULE | Freq: Every day | ORAL | Status: DC
  Administered 2018-10-09 – 2018-11-07 (×25): 180 mg via ORAL
  Filled 2018-10-09 (×30): qty 1

## 2018-10-09 MED ORDER — MIDODRINE HCL 5 MG PO TABS
10.0000 mg | ORAL_TABLET | Freq: Three times a day (TID) | ORAL | Status: DC
  Administered 2018-10-09 – 2018-12-07 (×176): 10 mg via ORAL
  Filled 2018-10-09 (×177): qty 2

## 2018-10-09 NOTE — Progress Notes (Signed)
PROGRESS NOTE        PATIENT DETAILS Name: [name] Age: 73 y.o. Sex: male Date of Birth: [birth year] Admit Date: 10/01/2018 Admitting Physician Vianne Bulls, MD CNO:[id number], Hollice Espy, MD  Brief Narrative: Patient is a 73 y.o. male with history of A. 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  Found to have a soft tissue infection of the left hip area, acute kidney injury and hyperkalemia.  Subsequently admitted to the hospitalist service.  See below for further details  Subjective: Patient in bed, appears comfortable, denies any headache, no fever, no chest pain or pressure, no shortness of breath , no abdominal pain. No focal weakness.  Assessment/Plan:  AKI due to ATN from Infection with hyperkalemia: Diuretics were held, he was given Kayexalate and hydrated, this problem has resolved.  Left hip soft tissue infection: Soft tissue CT scan of the left hip does not show any joint involvement.  He has been placed on IV vancomycin along with home medication Flagyl which he takes for chronic left hip prosthetic joint infection.  Soft tissue infection has improved, I wonder if there is element of massive edema causing some erythema as well, cleared by orthopedics from joint involvement standpoint which was ruled out by CT scan as well.  Stopped IV antibiotics on 10/06/2018 and switched him on oral doxycycline, continue to monitor with diuresis.  Overall he has been clinically stable and ready for  discharge pending bed availability at SNF.  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 Henrietta Dine however was evaluated by orthopedics and ID and [facility] once he had left hip infection.  He is on Flagyl-with planned end date November 19, 2018.  PDX was consulted and cleared him from joint perspective on 10/04/2018.  Recurrent pleural effusion: Felt to be secondary to hepatic hydrothorax- he is being aggressively diuresed as much as we can with low blood pressures, repeat thoracentesis likely will not achieve much as effusion likely to recur due to ascites and underlying cirrhosis.  Will get CT noncontrast on 10/07/2018 to reevaluate, continue diuresis with IV Lasix, albumin along with 1 dose of Zaroxolyn and monitor.  Cryptogenic cirrhosis: Prior hepatitis serology done earlier this month was negative-agree with plans outlined in prior discharge summary for outpatient follow-up. He now has volume overload/anasarca, diurese with IV albumin and monitor  Chronic atrial fibrillation with [place] vas 2 score of at least 2: Rate controlled with Cardizem and metoprolol, continue Eliquis.  Written parameters to hold medications for low blood pressure if needed.  Chronic back and right shoulder pain: Continue Lidoderm patch, Tylenol and as needed narcotics.  Bipolar disorder: Stable-continue Lexapro, Neurontin  OSA: Continue CPAP nightly  Morbid obesity  Acute on chronic debility/deconditioning: PT requested, may require SNF.    DVT Prophylaxis: Full dose anticoagulation with Eliquis  Code Status: Full code  Family Communication: None at bedside  Disposition Plan: Remain inpatient-but will plan on Home health vs SNF on discharge  Antimicrobial agents: Anti-infectives (From admission, onward)   Start     Dose/Rate Route Frequency Ordered Stop   10/08/18 0000  doxycycline  (VIBRA-TABS) 100 MG tablet     100 mg Oral Every 12 hours 10/08/18 0921     10/06/18 1000  doxycycline (VIBRA-TABS) tablet 100 mg     100 mg Oral Every 12 hours 10/06/18 0726     10/03/18 1800  metroNIDAZOLE (FLAGYL) tablet 500 mg     500 mg Oral Every 8 hours 10/03/18 1054     10/03/18 0700  vancomycin (VANCOCIN) 2,000 mg in sodium chloride 0.9 % 500 mL IVPB  Status:  Discontinued     2,000 mg 250 mL/hr over 120 Minutes Intravenous Every 36 hours 10/01/18 1942 10/06/18 0726   10/02/18 0200  ceFEPIme (MAXIPIME) 2 g in sodium chloride 0.9 % 100 mL IVPB  Status:  Discontinued     2 g 200 mL/hr over 30 Minutes Intravenous Every 8 hours 10/01/18 1942 10/02/18 1008   10/01/18 1830  vancomycin (VANCOCIN) 2,500 mg in sodium chloride 0.9 % 500 mL IVPB     2,500 mg 250 mL/hr over 120 Minutes Intravenous STAT 10/01/18 1825 10/02/18 0126   10/01/18 1800  ceFEPIme (MAXIPIME) 2 g in sodium chloride 0.9 % 100 mL IVPB     2 g 200 mL/hr over 30 Minutes Intravenous  Once 10/01/18 1747 10/01/18 1903   10/01/18 1800  metroNIDAZOLE (FLAGYL) IVPB 500 mg  Status:  Discontinued     500 mg 100 mL/hr over 60 Minutes Intravenous Every 8 hours 10/01/18 1747 10/03/18 1054   10/01/18 1800  vancomycin (VANCOCIN) IVPB 1000 mg/200 mL premix  Status:  Discontinued     1,000 mg 200 mL/hr over 60 Minutes Intravenous  Once 10/01/18 1747 10/01/18 1825      Procedures: None  CONSULTS:  None  Time spent: 25- minutes-Greater than 50% of this time was spent in counseling, explanation of diagnosis, planning of further management, and coordination of care.  MEDICATIONS: Scheduled Meds: . apixaban  5 mg Oral BID  . cycloSPORINE  1 drop Both Eyes BID  . diltiazem  240 mg Oral Daily  . doxycycline  100 mg Oral Q12H  . escitalopram  10 mg Oral Daily  . furosemide  60 mg Intravenous Once  . gabapentin  100 mg Oral TID  . lidocaine  1 patch Transdermal Q24H  . Melatonin  6 mg Oral QHS  . metroNIDAZOLE  500 mg Oral Q8H   . multivitamin with minerals  1 tablet Oral Daily  . pantoprazole  40 mg Oral Daily  . ENSURE MAX PROTEIN  11 oz Oral BID  . spironolactone  50 mg Oral Daily  . thiamine  100 mg Oral Daily   Continuous Infusions: . albumin human     PRN Meds:.acetaminophen, albuterol, alum & mag hydroxide-simeth, diltiazem, hydrOXYzine, [DISCONTINUED] ondansetron **OR** ondansetron (ZOFRAN) IV, oxyCODONE, polyethylene glycol, traZODone   PHYSICAL EXAM: Vital signs: Vitals:   10/08/18 0853 10/08/18 1430 10/08/18 2135 10/09/18 0543  BP: 95/64 109/71 110/71 (!) 121/106  Pulse: (!) 109 95 (!) 107 (!) 119  Resp:  16 18 (P) 18  Temp:  98.5 F (36.9 C) 98.7 F (37.1 C) 97.9 F (36.6 C)  TempSrc:  Oral Oral   SpO2:  96% 98% 98%  Weight: 118.8 kg   (P) 119.4 kg  Height:  Filed Weights   10/07/18 0500 10/08/18 0853 10/09/18 0543  Weight: 120.4 kg 118.8 kg (P) 119.4 kg   Body mass index is 40.02 kg/m (pended).    Exam  Awake Alert, Oriented X 3, No new F.N deficits, Normal affect Coin.AT,PERRAL Supple Neck,No JVD, No cervical lymphadenopathy appriciated.  Symmetrical Chest wall movement, Good air movement bilaterally, CTAB RRR,No Gallops, Rubs or new Murmurs, No Parasternal Heave +ve B.Sounds, Abd Soft, No tenderness, No organomegaly appriciated, No rebound - guarding or rigidity. No Cyanosis, Clubbing or edema, Anasarca , L. Hip cellulitis as below       I have personally reviewed following labs and imaging studies  LABORATORY DATA: CBC: Recent Labs  Lab 10/03/18 0531 10/04/18 0509 10/08/18 0600  WBC 6.6 7.2 7.7  HGB 11.0* 11.0* 11.9*  HCT 34.9* 34.6* 36.7*  MCV 87.3 87.8 86.4  PLT 224 240 678    Basic Metabolic Panel: Recent Labs  Lab 10/04/18 0509 10/05/18 0359 10/06/18 0422 10/07/18 0343 10/08/18 0600  NA 137 136 135 135 134*  K 4.4 4.5 4.3 4.9 4.6  CL 107 104 100 99 95*  CO2 22 22 22 24 25   GLUCOSE 102* 99 99 117* 100*  BUN 35* 33* 37* 44* 47*    CREATININE 1.00 1.07 1.25* 1.29* 1.30*  CALCIUM 9.0 9.1 9.1 9.4 9.7  MG  --  2.1  --  2.2  --     GFR: Estimated Creatinine Clearance: 64.4 mL/min (A) (by C-G formula based on SCr of 1.3 mg/dL (H)).  Liver Function Tests: No results for input(s): AST, ALT, ALKPHOS, BILITOT, PROT, ALBUMIN in the last 168 hours. No results for input(s): LIPASE, AMYLASE in the last 168 hours. No results for input(s): AMMONIA in the last 168 hours.  Coagulation Profile: No results for input(s): INR, PROTIME in the last 168 hours.  Cardiac Enzymes: No results for input(s): CKTOTAL, CKMB, CKMBINDEX, TROPONINI in the last 168 hours.  BNP (last 3 results) No results for input(s): PROBNP in the last 8760 hours.  HbA1C: No results for input(s): HGBA1C in the last 72 hours.  CBG: Recent Labs  Lab 10/05/18 0844 10/06/18 1100 10/07/18 0813 10/08/18 0830 10/09/18 0812  GLUCAP 90 80 94 99 92    Lipid Profile: No results for input(s): CHOL, HDL, LDLCALC, TRIG, CHOLHDL, LDLDIRECT in the last 72 hours.  Thyroid Function Tests: No results for input(s): TSH, T4TOTAL, FREET4, T3FREE, THYROIDAB in the last 72 hours.  Anemia Panel: No results for input(s): VITAMINB12, FOLATE, FERRITIN, TIBC, IRON, RETICCTPCT in the last 72 hours.  Urine analysis:    Component Value Date/Time   COLORURINE YELLOW 10/01/2018 1822   APPEARANCEUR CLEAR 10/01/2018 1822   APPEARANCEUR Clear 01/18/2018 1539   LABSPEC 1.010 10/01/2018 1822   PHURINE 5.0 10/01/2018 1822   GLUCOSEU NEGATIVE 10/01/2018 1822   HGBUR NEGATIVE 10/01/2018 1822   BILIRUBINUR NEGATIVE 10/01/2018 1822   BILIRUBINUR Negative 01/18/2018 1539   KETONESUR NEGATIVE 10/01/2018 1822   PROTEINUR NEGATIVE 10/01/2018 1822   UROBILINOGEN 0.2 09/06/2012 0213   NITRITE NEGATIVE 10/01/2018 1822   LEUKOCYTESUR NEGATIVE 10/01/2018 1822   LEUKOCYTESUR Negative 01/18/2018 1539    Sepsis Labs: Lactic Acid, Venous    Component Value Date/Time   LATICACIDVEN  1.7 10/01/2018 1833    MICROBIOLOGY: Recent Results (from the past 240 hour(s))  Blood Culture (routine x 2)     Status: None   Collection Time: 10/01/18  6:15 PM  Result Value Ref Range Status   Specimen Description  BLOOD LEFT HAND  Final   Special Requests   Final    BOTTLES DRAWN AEROBIC AND ANAEROBIC Blood Culture adequate volume   Culture   Final    NO GROWTH 5 DAYS Performed at [facility], [address], [place], [place] [address]    Report Status 10/06/2018 FINAL  Final  Blood Culture (routine x 2)     Status: None   Collection Time: 10/01/18  6:17 PM  Result Value Ref Range Status   Specimen Description BLOOD RIGHT ANTECUBITAL  Final   Special Requests   Final    BOTTLES DRAWN AEROBIC AND ANAEROBIC Blood Culture adequate volume   Culture   Final    NO GROWTH 5 DAYS Performed at [facility], [place] [address] [place], [place] [address]    Report Status 10/06/2018 FINAL  Final  Urine culture     Status: Abnormal   Collection Time: 10/01/18  6:24 PM  Result Value Ref Range Status   Specimen Description URINE, CLEAN CATCH  Final   Special Requests   Final    NONE Performed at [facility], [place] [address], [place], [place] [address]    Culture 20,000 COLONIES/mL PROTEUS MIRABILIS (A)  Final   Report Status 10/03/2018 FINAL  Final   Organism ID, Bacteria PROTEUS MIRABILIS (A)  Final      Susceptibility   Proteus mirabilis - MIC*    AMPICILLIN <=2 SENSITIVE Sensitive     CEFAZOLIN <=4 SENSITIVE Sensitive     CEFTRIAXONE <=1 SENSITIVE Sensitive     CIPROFLOXACIN <=0.25 SENSITIVE Sensitive     GENTAMICIN <=1 SENSITIVE Sensitive     IMIPENEM 8 INTERMEDIATE Intermediate     NITROFURANTOIN 128 RESISTANT Resistant     TRIMETH/SULFA <=20 SENSITIVE Sensitive     AMPICILLIN/SULBACTAM <=2 SENSITIVE Sensitive     PIP/TAZO <=4 SENSITIVE Sensitive     * 20,000 COLONIES/mL PROTEUS MIRABILIS    RADIOLOGY STUDIES/RESULTS: Dg Chest 1 View  Result Date:  10/01/2018 CLINICAL DATA:  Follow-up pneumothorax. EXAM: CHEST  1 VIEW COMPARISON:  09/09/2018 FINDINGS: Mild bilateral interstitial thickening. Small bilateral pleural effusions. Left lateral pleural thickening with a subtle lucency which may reflect a small loculated pneumothorax versus artifact. Right infrahilar hazy airspace disease likely reflecting atelectasis. Stable cardiomediastinal silhouette. No aggressive osseous lesion. Mild osteoarthritis of bilateral glenohumeral joints. IMPRESSION: Left lateral pleural thickening with a subtle lucency which may reflect a small loculated pneumothorax versus artifact. Recommend follow-up chest x-ray for re-evaluation. Bilateral interstitial thickening and small bilateral pleural effusions concerning for mild pulmonary edema. Electronically Signed   By: Kathreen Devoid   On: 10/01/2018 15:38   Dg Chest 2 View  Result Date: 09/09/2018 CLINICAL DATA:  BILATERAL pleural effusions, shortness of breath, weakness, history hypertension, CHF, former smoker EXAM: CHEST - 2 VIEW COMPARISON:  09/08/2018 FINDINGS: Enlargement of cardiac silhouette. Atherosclerotic calcification aorta. Mediastinal contours and pulmonary vascularity normal. Bibasilar effusions and atelectasis. Upper lungs clear. No pleural effusion or pneumothorax. Degenerative disc disease changes thoracic spine as well as BILATERAL glenohumeral degenerative changes. IMPRESSION: Enlargement of cardiac silhouette with bibasilar pleural effusions and atelectasis. Electronically Signed   By: Lavonia Dana M.D.   On: 09/09/2018 15:08   Ct Chest Wo Contrast  Result Date: 10/07/2018 CLINICAL DATA:  Chronic dyspnea. EXAM: CT CHEST WITHOUT CONTRAST TECHNIQUE: Multidetector CT imaging of the chest was performed following the standard protocol without IV contrast. COMPARISON:  Portable chest obtained earlier today and chest CTA  dated 08/17/2018. FINDINGS: Cardiovascular: Atheromatous calcifications, including the coronary  arteries and aorta. Stable borderline enlargement of the heart with mild biatrial enlargement. No pericardial effusion. Mediastinum/Nodes: No enlarged mediastinal or axillary lymph nodes. Thyroid gland, trachea, and esophagus demonstrate no significant findings. Lungs/Pleura: Small to moderate-sized right pleural effusion. Stable left pleural thickening with calcifications. Mild bilateral atelectasis and scarring. Upper Abdomen: Post gastric bypass changes. Musculoskeletal: Mild dextroconvex thoracolumbar scoliosis. Extensive thoracic and lower cervical spine degenerative changes. IMPRESSION: 1. Small to moderate-sized right pleural effusion. 2. Mild bilateral atelectasis and scarring. 3. 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 4.  Calcific coronary artery and aortic atherosclerosis. Aortic Atherosclerosis (ICD10-I70.0). Electronically Signed   By: Claudie Revering M.D.   On: 10/07/2018 20:31   Ct Hip Left W Contrast  Result Date: 10/01/2018 CLINICAL DATA:  Left hip pain EXAM: CT OF THE LOWER LEFT EXTREMITY WITH CONTRAST TECHNIQUE: Multidetector CT imaging of the lower left extremity was performed according to the standard protocol following intravenous contrast administration. COMPARISON:  09/03/2018 CONTRAST:  127mL OMNIPAQUE IOHEXOL 300 MG/ML  SOLN FINDINGS: Bones/Joint/Cartilage An uncemented left total hip arthroplasty is identified with the tip traversing a left femoral diaphyseal fracture that is incompletely healed. Subtle residual fracture lucencies are suggested on the coronal reformats, series 6/46 through 48 for instance. There is callus formation developing about the fracture, series 3/143. Slight residual varus configuration about the fracture is seen of the femur. No new/acute appearing fracture or bone destruction is seen. With reference to the hip joint, no significant joint effusion or abnormal lucencies to suggest loosening. The prosthetic  femoral head is seated within the acetabular component without evidence of asymmetric lining wear. No soft tissue mass or mineralization. No hematoma or abnormal fluid collections. Osteoarthritis of the included SI joints and pubic symphysis. No diastasis. Ligaments Suboptimally assessed by CT. Muscles and Tendons Negative for muscle edema, hematoma or definite tear. Soft tissues No soft tissue mass, mineralization or fluid collections. Postop scarring along the lateral aspect of the hip. IMPRESSION: 1. An uncemented left total hip arthroplasty is identified with the tip traversing a left femoral diaphyseal fracture that is incompletely healed. Subtle residual fracture lucencies are suggested on the coronal reformats, series 6/46 through 48. Slight residual varus configuration of the femur about the fracture is noted. 2. No new/acute appearing fracture or bone destruction is seen. 3. No significant joint effusion to suggest septic arthritis though this is of clinical concern, percutaneous sampling of the joint fluid could be attempted. No definite evidence of hardware loosening however. Electronically Signed   By: Ashley Royalty M.D.   On: 10/01/2018 20:08   Dg Chest Port 1 View  Result Date: 10/07/2018 CLINICAL DATA:  Shortness of breath. EXAM: PORTABLE CHEST 1 VIEW COMPARISON:  10/01/2017. FINDINGS: Left lower chest incompletely imaged. Cardiomegaly with diffuse bilateral interstitial prominence and bilateral pleural effusions. Findings have progressed from prior exam are most consistent with CHF. A component of pleural scarring may be present. IMPRESSION: Congestive heart failure with bilateral interstitial edema. Bibasilar pneumonia can not be excluded. Bilateral pleural effusions. A component of pleural scarring may be present. Electronically Signed   By: Marcello Moores  Register   On: 10/07/2018 08:58   Dg Chest Port 1 View  Result Date: 10/01/2018 CLINICAL DATA:  LEFT hip replacement 6 months ago, sepsis,  question LEFT leg/hip infection EXAM: PORTABLE CHEST 1 VIEW COMPARISON:  Portable exam 1756 hours compared to 10/01/2018 FINDINGS: RIGHT costophrenic angle excluded. Enlargement of cardiac  silhouette. Mediastinal contours and pulmonary vascularity normal. Atherosclerotic calcification aorta. Small LEFT pleural effusion and basilar atelectasis at lower LEFT chest question partially loculated. No definite infiltrate or pneumothorax. Bones demineralized. IMPRESSION: LEFT pleural effusion and basilar atelectasis, question partially locule at the lower lateral LEFT chest. Electronically Signed   By: Lavonia Dana M.D.   On: 10/01/2018 18:05     LOS: 5 days   Signature  Lala Lund M.D on 10/09/2018 at 9:30 AM   -  To page go to [email or web address]

## 2018-10-09 NOTE — Progress Notes (Signed)
Left hip with redness that has increased through the day now warmer to touch as well.

## 2018-10-10 LAB — BASIC METABOLIC PANEL
Anion gap: 16 — ABNORMAL HIGH (ref 5–15)
BUN: 68 mg/dL — ABNORMAL HIGH (ref 8–23)
CO2: 23 mmol/L (ref 22–32)
Calcium: 9.6 mg/dL (ref 8.9–10.3)
Chloride: 94 mmol/L — ABNORMAL LOW (ref 98–111)
Creatinine, Ser: 1.49 mg/dL — ABNORMAL HIGH (ref 0.61–1.24)
GFR calc Af Amer: 54 mL/min — ABNORMAL LOW (ref >60)
GFR calc non Af Amer: 46 mL/min — ABNORMAL LOW (ref >60)
Glucose, Bld: 121 mg/dL — ABNORMAL HIGH (ref 70–99)
Potassium: 4.8 mmol/L (ref 3.5–5.1)
Sodium: 133 mmol/L — ABNORMAL LOW (ref 135–145)

## 2018-10-10 LAB — GLUCOSE, CAPILLARY: Glucose-Capillary: 109 mg/dL — ABNORMAL HIGH (ref 70–99)

## 2018-10-10 NOTE — Progress Notes (Signed)
PROGRESS NOTE        PATIENT DETAILS Name: [name] Age: 73 y.o. Sex: male Date of Birth: [birth year] Admit Date: 10/01/2018 Admitting Physician Vianne Bulls, MD [id number], Hollice Espy, MD  Brief Narrative: Patient is a 73 y.o. male with history of A. 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  Found to have a soft tissue infection of the left hip area, acute kidney injury and hyperkalemia.  Subsequently admitted to the hospitalist service.  See below for further details  Subjective: Patient in bed, appears comfortable, denies any headache, no fever, no chest pain or pressure, no shortness of breath , no abdominal pain. No focal weakness.   Assessment/Plan:  AKI due to ATN from Infection with hyperkalemia: Diuretics were held, he was given Kayexalate and hydrated, this problem has resolved.  Left hip soft tissue infection /dependent edema related erythema: Soft tissue CT scan of the left hip does not show any joint involvement.  Initially received IV vancomycin in addition to Flagyl which he takes for chronic left hip prosthetic joint infection.    His left hip skin has mild erythema but no warmth, I seriously question if we are dealing with dependent edema related erythema rather than acute infection.  Stopped IV Vancomycin on 10/06/2018 and currently on doxycycline and chronic Flagyl combination.  No fever or leukocytosis.  I do not think this is acute infection.  Is also been seen by  orthopedics this admission and they have declared left hip joint to be stable from an infection standpoint.  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 Henrietta Dine however was evaluated by orthopedics and ID and [facility] once he had left hip infection.  He is on Flagyl-with planned end date November 19, 2018.  PDX was consulted and cleared him from joint perspective on 10/04/2018.  Recurrent pleural effusion: Felt to be secondary to hepatic hydrothorax- he is being aggressively diuresed as much as we can with low blood pressures, repeat thoracentesis likely will not achieve much as effusion likely to recur due to ascites and underlying cirrhosis.  Will get CT noncontrast on 10/07/2018 to reevaluate, Aldactone, will receive Lasix and Zaroxolyn once sodium improves.  Most of his fluid is third spaced due to low albumin levels from underlying cirrhosis, now mild intravascular dehydration complicating issues.  Cryptogenic cirrhosis: Prior hepatitis serology done earlier this month was negative-agree with plans outlined in prior discharge summary for outpatient follow-up. He now has volume overload/anasarca, diurese with IV albumin and monitor  Chronic atrial fibrillation with [place] vas 2 score of at least 2: Rate controlled with Cardizem and metoprolol, continue Eliquis.  Written parameters to hold medications for low blood pressure if needed.  Chronic back and right shoulder pain: Continue Lidoderm patch, Tylenol and as needed narcotics.  Bipolar disorder: Stable-continue Lexapro, Neurontin continued.  OSA: Continue CPAP nightly  Morbid obesity  Acute  on chronic debility/deconditioning: PT requested, may require SNF.    DVT Prophylaxis: Full dose anticoagulation with Eliquis  Code Status: Full code  Family Communication: None at bedside  Disposition Plan: Remain inpatient-but will  plan on Home health vs SNF on discharge  Antimicrobial agents: Anti-infectives (From admission, onward)   Start     Dose/Rate Route Frequency Ordered Stop   10/08/18 0000  doxycycline (VIBRA-TABS) 100 MG tablet     100 mg Oral Every 12 hours 10/08/18 0921     10/06/18 1000  doxycycline (VIBRA-TABS) tablet 100 mg     100 mg Oral Every 12 hours 10/06/18 0726     10/03/18 1800  metroNIDAZOLE (FLAGYL) tablet 500 mg     500 mg Oral Every 8 hours 10/03/18 1054     10/03/18 0700  vancomycin (VANCOCIN) 2,000 mg in sodium chloride 0.9 % 500 mL IVPB  Status:  Discontinued     2,000 mg 250 mL/hr over 120 Minutes Intravenous Every 36 hours 10/01/18 1942 10/06/18 0726   10/02/18 0200  ceFEPIme (MAXIPIME) 2 g in sodium chloride 0.9 % 100 mL IVPB  Status:  Discontinued     2 g 200 mL/hr over 30 Minutes Intravenous Every 8 hours 10/01/18 1942 10/02/18 1008   10/01/18 1830  vancomycin (VANCOCIN) 2,500 mg in sodium chloride 0.9 % 500 mL IVPB     2,500 mg 250 mL/hr over 120 Minutes Intravenous STAT 10/01/18 1825 10/02/18 0126   10/01/18 1800  ceFEPIme (MAXIPIME) 2 g in sodium chloride 0.9 % 100 mL IVPB     2 g 200 mL/hr over 30 Minutes Intravenous  Once 10/01/18 1747 10/01/18 1903   10/01/18 1800  metroNIDAZOLE (FLAGYL) IVPB 500 mg  Status:  Discontinued     500 mg 100 mL/hr over 60 Minutes Intravenous Every 8 hours 10/01/18 1747 10/03/18 1054   10/01/18 1800  vancomycin (VANCOCIN) IVPB 1000 mg/200 mL premix  Status:  Discontinued     1,000 mg 200 mL/hr over 60 Minutes Intravenous  Once 10/01/18 1747 10/01/18 1825      Procedures: None  CONSULTS: Ortho  Time spent: 25- minutes-Greater than 50% of this time was spent in counseling, explanation of diagnosis, planning of further management, and coordination of care.  MEDICATIONS: Scheduled Meds: . apixaban  5 mg Oral BID  . cycloSPORINE  1 drop Both Eyes BID  . diltiazem  180 mg Oral Daily  . doxycycline  100 mg Oral Q12H  . escitalopram  10  mg Oral Daily  . gabapentin  100 mg Oral TID  . lidocaine  1 patch Transdermal Q24H  . Melatonin  6 mg Oral QHS  . metroNIDAZOLE  500 mg Oral Q8H  . midodrine  10 mg Oral TID WC  . multivitamin with minerals  1 tablet Oral Daily  . pantoprazole  40 mg Oral Daily  . ENSURE MAX PROTEIN  11 oz Oral BID  . spironolactone  50 mg Oral Daily  . thiamine  100 mg Oral Daily   Continuous Infusions: . albumin human     PRN Meds:.acetaminophen, albuterol, alum & mag hydroxide-simeth, diltiazem, hydrOXYzine, menthol-cetylpyridinium, [DISCONTINUED] ondansetron **OR** ondansetron (ZOFRAN) IV, oxyCODONE, polyethylene glycol, traZODone   PHYSICAL EXAM: Vital signs: Vitals:   10/09/18 1007 10/09/18 1507 10/09/18 2156 10/10/18 0636  BP: 99/62 99/89 104/70 111/69  Pulse: 93 (!) 115 (!) 110 (!) 105  Resp:  15 18 18   Temp:  98.3 F (36.8 C) 98 F (36.7 C) 98.9 F (37.2 C)  TempSrc:  Oral Oral Oral  SpO2:   96% 95%  Weight: 120.2 kg     Height:       Filed Weights   10/08/18 0853 10/09/18 0543 10/09/18 1007  Weight: 118.8 kg 119.4 kg 120.2 kg   Body mass index is 40.29 kg/m.    Exam  Awake Alert, Oriented X 3, No new F.N deficits, Normal affect [place].AT,PERRAL Supple Neck,No JVD, No cervical lymphadenopathy appriciated.  Symmetrical Chest wall movement, Good air movement bilaterally, CTAB RRR,No Gallops, Rubs or new Murmurs, No Parasternal Heave +ve B.Sounds, Abd Soft, No tenderness, No organomegaly appriciated, No rebound - guarding or rigidity. No Cyanosis,   Anasarca , L. Hip erythema as below       I have personally reviewed following labs and imaging studies  LABORATORY DATA: CBC: Recent Labs  Lab 10/04/18 0509 10/08/18 0600  WBC 7.2 7.7  HGB 11.0* 11.9*  HCT 34.6* 36.7*  MCV 87.8 86.4  PLT 240 102    Basic Metabolic Panel: Recent Labs  Lab 10/05/18 0359 10/06/18 0422 10/07/18 0343 10/08/18 0600 10/10/18 0355  NA 136 135 135 134* 133*  K 4.5 4.3 4.9 4.6 4.8   CL 104 100 99 95* 94*  CO2 22 22 24 25 23   GLUCOSE 99 99 117* 100* 121*  BUN 33* 37* 44* 47* 68*  CREATININE 1.07 1.25* 1.29* 1.30* 1.49*  CALCIUM 9.1 9.1 9.4 9.7 9.6  MG 2.1  --  2.2  --   --     GFR: Estimated Creatinine Clearance: 56.5 mL/min (A) (by C-G formula based on SCr of 1.49 mg/dL (H)).  Liver Function Tests: No results for input(s): AST, ALT, ALKPHOS, BILITOT, PROT, ALBUMIN in the last 168 hours. No results for input(s): LIPASE, AMYLASE in the last 168 hours. No results for input(s): AMMONIA in the last 168 hours.  Coagulation Profile: No results for input(s): INR, PROTIME in the last 168 hours.  Cardiac Enzymes: No results for input(s): CKTOTAL, CKMB, CKMBINDEX, TROPONINI in the last 168 hours.  BNP (last 3 results) No results for input(s): PROBNP in the last 8760 hours.  HbA1C: No results for input(s): HGBA1C in the last 72 hours.  CBG: Recent Labs  Lab 10/06/18 1100 10/07/18 0813 10/08/18 0830 10/09/18 0812 10/10/18 0752  GLUCAP 80 94 99 92 109*    Lipid Profile: No results for input(s): CHOL, HDL, LDLCALC, TRIG, CHOLHDL, LDLDIRECT in the last 72 hours.  Thyroid Function Tests: No results for input(s): TSH, T4TOTAL, FREET4, T3FREE, THYROIDAB in the last 72 hours.  Anemia Panel: No results for input(s): VITAMINB12, FOLATE, FERRITIN, TIBC, IRON, RETICCTPCT in the last 72 hours.  Urine analysis:    Component Value Date/Time   COLORURINE YELLOW 10/01/2018 1822   APPEARANCEUR CLEAR 10/01/2018 1822   APPEARANCEUR Clear 01/18/2018 1539   LABSPEC 1.010 10/01/2018 1822   PHURINE 5.0 10/01/2018 1822   GLUCOSEU NEGATIVE 10/01/2018 1822   HGBUR NEGATIVE 10/01/2018 1822   BILIRUBINUR NEGATIVE 10/01/2018 1822   BILIRUBINUR Negative 01/18/2018 1539   KETONESUR NEGATIVE 10/01/2018 1822   PROTEINUR NEGATIVE 10/01/2018 1822   UROBILINOGEN 0.2 09/06/2012 0213   NITRITE NEGATIVE 10/01/2018 1822   LEUKOCYTESUR NEGATIVE 10/01/2018 1822   LEUKOCYTESUR  Negative 01/18/2018 1539    Sepsis Labs: Lactic Acid, Venous    Component Value Date/Time   LATICACIDVEN 1.7 10/01/2018 1833    MICROBIOLOGY: Recent Results (from the past 240 hour(s))  Blood Culture (routine x 2)     Status: None   Collection  Time: 10/01/18  6:15 PM  Result Value Ref Range Status   Specimen Description BLOOD LEFT HAND  Final   Special Requests   Final    BOTTLES DRAWN AEROBIC AND ANAEROBIC Blood Culture adequate volume   Culture   Final    NO GROWTH 5 DAYS Performed at [facility], [address] [place], [place] [address]    Report Status 10/06/2018 FINAL  Final  Blood Culture (routine x 2)     Status: None   Collection Time: 10/01/18  6:17 PM  Result Value Ref Range Status   Specimen Description BLOOD RIGHT ANTECUBITAL  Final   Special Requests   Final    BOTTLES DRAWN AEROBIC AND ANAEROBIC Blood Culture adequate volume   Culture   Final    NO GROWTH 5 DAYS Performed at [facility], [place] [address], [place], [place] [address]    Report Status 10/06/2018 FINAL  Final  Urine culture     Status: Abnormal   Collection Time: 10/01/18  6:24 PM  Result Value Ref Range Status   Specimen Description URINE, CLEAN CATCH  Final   Special Requests   Final    NONE Performed at [facility], [place] [address], [place], [place] [address]    Culture 20,000 COLONIES/mL PROTEUS MIRABILIS (A)  Final   Report Status 10/03/2018 FINAL  Final   Organism ID, Bacteria PROTEUS MIRABILIS (A)  Final      Susceptibility   Proteus mirabilis - MIC*    AMPICILLIN <=2 SENSITIVE Sensitive     CEFAZOLIN <=4 SENSITIVE Sensitive     CEFTRIAXONE <=1 SENSITIVE Sensitive     CIPROFLOXACIN <=0.25 SENSITIVE Sensitive     GENTAMICIN <=1 SENSITIVE Sensitive     IMIPENEM 8 INTERMEDIATE Intermediate     NITROFURANTOIN 128 RESISTANT Resistant     TRIMETH/SULFA <=20 SENSITIVE Sensitive     AMPICILLIN/SULBACTAM <=2 SENSITIVE Sensitive     PIP/TAZO <=4 SENSITIVE Sensitive       * 20,000 COLONIES/mL PROTEUS MIRABILIS    RADIOLOGY STUDIES/RESULTS: Dg Chest 1 View  Result Date: 10/01/2018 CLINICAL DATA:  Follow-up pneumothorax. EXAM: CHEST  1 VIEW COMPARISON:  09/09/2018 FINDINGS: Mild bilateral interstitial thickening. Small bilateral pleural effusions. Left lateral pleural thickening with a subtle lucency which may reflect a small loculated pneumothorax versus artifact. Right infrahilar hazy airspace disease likely reflecting atelectasis. Stable cardiomediastinal silhouette. No aggressive osseous lesion. Mild osteoarthritis of bilateral glenohumeral joints. IMPRESSION: Left lateral pleural thickening with a subtle lucency which may reflect a small loculated pneumothorax versus artifact. Recommend follow-up chest x-ray for re-evaluation. Bilateral interstitial thickening and small bilateral pleural effusions concerning for mild pulmonary edema. Electronically Signed   By: Kathreen Devoid   On: 10/01/2018 15:38   Ct Chest Wo Contrast  Result Date: 10/07/2018 CLINICAL DATA:  Chronic dyspnea. EXAM: CT CHEST WITHOUT CONTRAST TECHNIQUE: Multidetector CT imaging of the chest was performed following the standard protocol without IV contrast. COMPARISON:  Portable chest obtained earlier today and chest CTA dated 08/17/2018. FINDINGS: Cardiovascular: Atheromatous calcifications, including the coronary arteries and aorta. Stable borderline enlargement of the heart with mild biatrial enlargement. No pericardial effusion. Mediastinum/Nodes: No enlarged mediastinal or axillary lymph nodes. Thyroid gland, trachea, and esophagus demonstrate no significant findings. Lungs/Pleura: Small to moderate-sized right pleural effusion. Stable left pleural thickening with calcifications. Mild bilateral atelectasis and scarring. Upper Abdomen: Post gastric bypass changes. Musculoskeletal: Mild dextroconvex thoracolumbar scoliosis. Extensive thoracic and lower cervical spine degenerative changes. IMPRESSION:  1. Small to moderate-sized right pleural effusion. 2. Mild bilateral atelectasis and scarring. 3. 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 4.  Calcific coronary artery and aortic atherosclerosis. Aortic Atherosclerosis (ICD10-I70.0). Electronically Signed   By: Claudie Revering M.D.   On: 10/07/2018 20:31   Ct Hip Left W Contrast  Result Date: 10/01/2018 CLINICAL DATA:  Left hip pain EXAM: CT OF THE LOWER LEFT EXTREMITY WITH CONTRAST TECHNIQUE: Multidetector CT imaging of the lower left extremity was performed according to the standard protocol following intravenous contrast administration. COMPARISON:  09/03/2018 CONTRAST:  162mL OMNIPAQUE IOHEXOL 300 MG/ML  SOLN FINDINGS: Bones/Joint/Cartilage An uncemented left total hip arthroplasty is identified with the tip traversing a left femoral diaphyseal fracture that is incompletely healed. Subtle residual fracture lucencies are suggested on the coronal reformats, series 6/46 through 48 for instance. There is callus formation developing about the fracture, series 3/143. Slight residual varus configuration about the fracture is seen of the femur. No new/acute appearing fracture or bone destruction is seen. With reference to the hip joint, no significant joint effusion or abnormal lucencies to suggest loosening. The prosthetic femoral head is seated within the acetabular component without evidence of asymmetric lining wear. No soft tissue mass or mineralization. No hematoma or abnormal fluid collections. Osteoarthritis of the included SI joints and pubic symphysis. No diastasis. Ligaments Suboptimally assessed by CT. Muscles and Tendons Negative for muscle edema, hematoma or definite tear. Soft tissues No soft tissue mass, mineralization or fluid collections. Postop scarring along the lateral aspect of the hip. IMPRESSION: 1. An uncemented left total hip arthroplasty is identified with the tip traversing a left  femoral diaphyseal fracture that is incompletely healed. Subtle residual fracture lucencies are suggested on the coronal reformats, series 6/46 through 48. Slight residual varus configuration of the femur about the fracture is noted. 2. No new/acute appearing fracture or bone destruction is seen. 3. No significant joint effusion to suggest septic arthritis though this is of clinical concern, percutaneous sampling of the joint fluid could be attempted. No definite evidence of hardware loosening however. Electronically Signed   By: Ashley Royalty M.D.   On: 10/01/2018 20:08   Dg Chest Port 1 View  Result Date: 10/07/2018 CLINICAL DATA:  Shortness of breath. EXAM: PORTABLE CHEST 1 VIEW COMPARISON:  10/01/2017. FINDINGS: Left lower chest incompletely imaged. Cardiomegaly with diffuse bilateral interstitial prominence and bilateral pleural effusions. Findings have progressed from prior exam are most consistent with CHF. A component of pleural scarring may be present. IMPRESSION: Congestive heart failure with bilateral interstitial edema. Bibasilar pneumonia can not be excluded. Bilateral pleural effusions. A component of pleural scarring may be present. Electronically Signed   By: Marcello Moores  Register   On: 10/07/2018 08:58   Dg Chest Port 1 View  Result Date: 10/01/2018 CLINICAL DATA:  LEFT hip replacement 6 months ago, sepsis, question LEFT leg/hip infection EXAM: PORTABLE CHEST 1 VIEW COMPARISON:  Portable exam 1756 hours compared to 10/01/2018 FINDINGS: RIGHT costophrenic angle excluded. Enlargement of cardiac silhouette. Mediastinal contours and pulmonary vascularity normal. Atherosclerotic calcification aorta. Small LEFT pleural effusion and basilar atelectasis at lower LEFT chest question partially loculated. No definite infiltrate or pneumothorax. Bones demineralized. IMPRESSION: LEFT pleural effusion and basilar atelectasis, question partially locule at the lower lateral LEFT chest. Electronically Signed   By:  Lavonia Dana M.D.   On: 10/01/2018 18:05     LOS: 6 days   Signature  Lala Lund M.D on 10/10/2018 at 10:52 AM   -  To page go to [email or web address]

## 2018-10-10 NOTE — Progress Notes (Signed)
Patient needed no help with home CPAP At this time, just assisted with water. No distress noted, patient understands to contact respiratory if he needs further assistance.

## 2018-10-11 DIAGNOSIS — I10 Essential (primary) hypertension: Secondary | ICD-10-CM

## 2018-10-11 DIAGNOSIS — Z7901 Long term (current) use of anticoagulants: Secondary | ICD-10-CM

## 2018-10-11 DIAGNOSIS — N179 Acute kidney failure, unspecified: Secondary | ICD-10-CM

## 2018-10-11 DIAGNOSIS — F419 Anxiety disorder, unspecified: Secondary | ICD-10-CM

## 2018-10-11 DIAGNOSIS — Z91048 Other nonmedicinal substance allergy status: Secondary | ICD-10-CM

## 2018-10-11 DIAGNOSIS — G4733 Obstructive sleep apnea (adult) (pediatric): Secondary | ICD-10-CM

## 2018-10-11 DIAGNOSIS — M316 Other giant cell arteritis: Secondary | ICD-10-CM

## 2018-10-11 DIAGNOSIS — Z9989 Dependence on other enabling machines and devices: Secondary | ICD-10-CM

## 2018-10-11 DIAGNOSIS — J90 Pleural effusion, not elsewhere classified: Secondary | ICD-10-CM

## 2018-10-11 DIAGNOSIS — Z87311 Personal history of (healed) other pathological fracture: Secondary | ICD-10-CM

## 2018-10-11 DIAGNOSIS — Z8739 Personal history of other diseases of the musculoskeletal system and connective tissue: Secondary | ICD-10-CM

## 2018-10-11 DIAGNOSIS — R454 Irritability and anger: Secondary | ICD-10-CM

## 2018-10-11 DIAGNOSIS — K7460 Unspecified cirrhosis of liver: Secondary | ICD-10-CM

## 2018-10-11 DIAGNOSIS — L539 Erythematous condition, unspecified: Secondary | ICD-10-CM

## 2018-10-11 DIAGNOSIS — F329 Major depressive disorder, single episode, unspecified: Secondary | ICD-10-CM

## 2018-10-11 DIAGNOSIS — I4891 Unspecified atrial fibrillation: Secondary | ICD-10-CM

## 2018-10-11 DIAGNOSIS — Z91030 Bee allergy status: Secondary | ICD-10-CM

## 2018-10-11 DIAGNOSIS — Z87891 Personal history of nicotine dependence: Secondary | ICD-10-CM

## 2018-10-11 DIAGNOSIS — Z96642 Presence of left artificial hip joint: Secondary | ICD-10-CM

## 2018-10-11 LAB — C-REACTIVE PROTEIN: CRP: 4.1 mg/dL — ABNORMAL HIGH (ref <1.0)

## 2018-10-11 LAB — CBC
HCT: 37.5 % — ABNORMAL LOW (ref 39.0–52.0)
Hemoglobin: 11.8 g/dL — ABNORMAL LOW (ref 13.0–17.0)
MCH: 27.3 pg (ref 26.0–34.0)
MCHC: 31.5 g/dL (ref 30.0–36.0)
MCV: 86.6 fL (ref 80.0–100.0)
Platelets: 269 10*3/uL (ref 150–400)
RBC: 4.33 MIL/uL (ref 4.22–5.81)
RDW: 17.4 % — ABNORMAL HIGH (ref 11.5–15.5)
WBC: 7.5 10*3/uL (ref 4.0–10.5)
nRBC: 0 % (ref 0.0–0.2)

## 2018-10-11 LAB — BASIC METABOLIC PANEL
Anion gap: 10 (ref 5–15)
BUN: 68 mg/dL — ABNORMAL HIGH (ref 8–23)
CO2: 24 mmol/L (ref 22–32)
Calcium: 9.2 mg/dL (ref 8.9–10.3)
Chloride: 98 mmol/L (ref 98–111)
Creatinine, Ser: 1.55 mg/dL — ABNORMAL HIGH (ref 0.61–1.24)
GFR calc Af Amer: 51 mL/min — ABNORMAL LOW (ref >60)
GFR calc non Af Amer: 44 mL/min — ABNORMAL LOW (ref >60)
Glucose, Bld: 166 mg/dL — ABNORMAL HIGH (ref 70–99)
Potassium: 4.5 mmol/L (ref 3.5–5.1)
Sodium: 132 mmol/L — ABNORMAL LOW (ref 135–145)

## 2018-10-11 LAB — GLUCOSE, CAPILLARY: Glucose-Capillary: 105 mg/dL — ABNORMAL HIGH (ref 70–99)

## 2018-10-11 LAB — SEDIMENTATION RATE: Sed Rate: 53 mm/hr — ABNORMAL HIGH (ref 0–16)

## 2018-10-11 MED ORDER — PRO-STAT SUGAR FREE PO LIQD
30.0000 mL | Freq: Three times a day (TID) | ORAL | Status: DC
  Administered 2018-10-11 – 2018-10-12 (×5): 30 mL via ORAL
  Filled 2018-10-11 (×6): qty 30

## 2018-10-11 MED ORDER — DEXTROSE 5 % IV SOLN
INTRAVENOUS | Status: DC
  Administered 2018-10-11 (×2): via INTRAVENOUS

## 2018-10-11 MED ORDER — PROMETHAZINE HCL 25 MG/ML IJ SOLN
12.5000 mg | Freq: Once | INTRAMUSCULAR | Status: AC
  Administered 2018-10-11: 12.5 mg via INTRAVENOUS
  Filled 2018-10-11: qty 1

## 2018-10-11 NOTE — Consult Note (Addendum)
Date of Admission:  10/01/2018          Reason for Consult: Question of cellulitis and patient with history of deep prosthetic joint infection    Referring Provider:Dr. Candiss Cooper   Assessment:  1. 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 AKI 3. Cirrhosis of the liver  4. Recurrent pleural effusions  Plan:  1. Discussed with Dr. Maureen Cooper who will see the patient tomorrow and likely perform joint aspiration for cell count and differential and culture] 2. He may indeed wire further surgery if this can be done 3. I will discontinue his doxycycline in the interim to try to increase yield on intraoperative cultures that may be coming 4. If he has repeat surgery I would like to restart him on IV antibiotics for a period of 6 to 8 weeks likely followed by oral antibiotics 5. I am rechecking his inflammatory markers as well  Principal Problem:   Hyperkalemia Active Problems:   Left hip postoperative wound infection   AKI (acute kidney injury) (HCC)   Depression   Pleural effusion on left   OSA on CPAP   Unspecified atrial fibrillation (HCC)   Physical debility   Other cirrhosis of liver (HCC)   Chronic back pain   Scheduled Meds: . apixaban  5 mg Oral BID  . cycloSPORINE  1 drop Both Eyes BID  . diltiazem  180 mg Oral Daily  . doxycycline  100 mg Oral Q12H  . escitalopram  10 mg Oral Daily  . feeding supplement (PRO-STAT SUGAR FREE 64)  30 mL Oral TID WC  . gabapentin  100 mg Oral TID  . lidocaine  1 patch Transdermal Q24H  . Melatonin  6 mg Oral QHS  . metroNIDAZOLE  500 mg Oral Q8H  . midodrine  10 mg Oral TID WC  . multivitamin with minerals  1 tablet Oral Daily  . pantoprazole  40 mg Oral Daily  . ENSURE MAX PROTEIN  11 oz Oral BID  . spironolactone  50 mg Oral Daily  . thiamine  100 mg Oral Daily   Continuous Infusions: . albumin human    . dextrose 75 mL/hr at 10/11/18 1211   PRN  Meds:.acetaminophen, albuterol, alum & mag hydroxide-simeth, diltiazem, hydrOXYzine, menthol-cetylpyridinium, [DISCONTINUED] ondansetron **OR** ondansetron (ZOFRAN) IV, oxyCODONE, polyethylene glycol, traZODone  HPI: [name] is a 73 y.o. male 73 y.o. male with HTN, A. 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  He has a history admission to [name] on Julne 17th 2019.  Presented with left hip pain. XR hip with lucency surrounding the distal portion of the femoral component of the left hip arthroplasty suggesting loosening and development of mildly displaced cortical fracture overlying the distal tip of the prosthesis in the proximal left femoral shaft.Underwent L THA on 0/26/37, complicated for acute postoperative anemia and ecchymosis of the left hip.   03/15/18: Then admitted to [name] with drainage from hip incision, fever 101.8, started on [name] on 03/12/18. Underwent I&D on 03/17/18. OR cultures + E. Coli and Enterobacter. Continued on Cooper.he was seen by my partner Dr. Johnnye Cooper and changed to d on Ceftriaxone (to end 04/28/18)   on August 16 he 04/16/18: Was admitted to [name] with visual hallucinations, poor oral intake, decreased mobility. Found to be in rapid  A fib, XR with PNA. Treated with BB and Coumadin, broad spectrum abx - then transitioned back to CTX for hip. Left hip noted to have persistent mild serosanguineous suppurative drainage, without erythema.    He and presented to [name] on 06/17/18, via EMS, with AMS and respiratory distress requiring intubation. CXR, CT head, CTA chest were generally unremarkable. Labs were significant for no leukocytosis, UA negative, Influenza negative. He was transferred to [name] for continued care.  Admission History [name] 06/17/18 - 07/14/18) Shortly after transfer, Mr.  Cooper was extubated , and continued to have intermittent AMS without identifiable triggers, ultimately presumed hypoactive delirium. Repeat CT head 06/20/18 was concerning for C1 fracture, cleared by Ortho/Spine. On 06/29/18, he fell from his bed, injuring his left hip. XR revealed a new linear fracture of his prosthetic hip joint. CT showed a large fluid collection extending from the left greater trochanter to the lateral skin surface (4.1 x 10.7 x 12.6 cm). On 07/01/18, he developed wound dehiscence (swab and abscess culture, + Bacteroides fragilis), and IR placed a drain on 07/02/18 that was accidentally removed on 07/04/18. On 07/06/18 he underwent I&D bu [name] at [name] with and revision of the femoral head component. Operative note read, "Deep scar tissue and necrotic tissueincluding fascia, muscle and bone was excised sharply...Marland KitchenMarland KitchenThe decision was made to retain the polyethylene liner due to difficult exposure and increased morbidity from further mobilization of the femur with risk for periprosthetic fracture along the previous fracture site." Two OR cultures were obtained, both + B. frag. He was seen by [name] ID and was discharged with the plan to complete 6 weeks of Ertapenem, and then transition to long term suppression due to retained hardware.   He has been since followed by [name] at [name] from [name] on whose notes I have largely relied for this history.  IN the interim he helped some problems with his appetite and then lost IV access for his IV ertapenem.  Ultimately he was changed over to chronic metronidazole. ID at [name] noted EXTREMELY high rate of failure of antibiotics for B fragilis and chose not is all for chronic suppressive therapy for him.  He is then admitted to [name] on 31 January complaining of increased redness and tenderness overlying his left hip.  He has been seeing pulmonary for his recurrent pulmonary effusions and was complaining about his  left hip.  He has been on various antibiotics intravenously while in the inpatient world eluding Frank metronidazole cefepime then Frank and metronidazole then metronidazole and doxycycline.  CT of the left hip performed on January 31 which showed:  1. An uncemented left total hip arthroplasty is identified with the tip traversing a left femoral diaphyseal fracture that is incompletely healed. Subtle residual fracture lucencies are suggested on the coronal reformats, series 6/46 through 48. Slight residual varus configuration of the femur about the fracture is noted. 2. No new/acute appearing fracture or bone destruction is seen. 3. No significant joint effusion to suggest septic arthritis though this is of clinical concern, percutaneous sampling of the joint fluid could be attempted. No definite evidence of hardware loosening however.   He was seen in the interim on 3 February by Hilbert Odor physician assistant with [facility].  They felt that he was suffering from a cellulitis and did not have evidence of infection of the joint.  They were however unaware of his entire history at [facility] which we have discovered today.  Tells me today when I interviewed him that the pain that he is experiencing in the left hip is "just like when my hip got worse and I had to go to [facility].  He rates it as being a 4 out of 10 in severity at present.  His inflammatory markers are not were elevated on admission   We were consulted due to concerns with the patient that he was in fact suffering from a deep infection and that the metronidazole was not keeping it under control.   I got in touch with [facility] will see the patient tomorrow and likely perform an aspirate of the joint space.  I spent greater than 110 minutes with the patient including greater than 50% of time in face to face counsel of the patient regarding his course his work-up at [facility] long here and  [facility], and review of the records from [facility] and in coordination of care with the primary team and orthopedic surgery.  Review of Systems: Review of Systems  Constitutional: Positive for malaise/fatigue. Negative for chills, diaphoresis, fever and weight loss.  HENT: Negative for congestion, ear pain, hearing loss and sore throat.   Eyes: Negative for blurred vision and double vision.  Respiratory: Negative for cough, sputum production, shortness of breath, wheezing and stridor.   Cardiovascular: Negative for chest pain, palpitations and leg swelling.  Gastrointestinal: Positive for nausea. Negative for abdominal pain, blood in stool, constipation, diarrhea, heartburn, melena and vomiting.  Genitourinary: Negative for dysuria, flank pain and frequency.  Musculoskeletal: Positive for myalgias. Negative for joint pain.  Neurological: Negative for dizziness, sensory change, focal weakness, loss of consciousness, weakness and headaches.  Endo/Heme/Allergies: Does not bruise/bleed easily.  Psychiatric/Behavioral: Negative for depression, substance abuse and suicidal ideas. The patient does not have insomnia.     Past Medical History:  Diagnosis Date  . Anginal pain ([place]) 2006   evaluated by cardio  . Arthritis    knees,feet,shoulders,elbows.hands  . Constipation   . Dyspnea    with exertion   . Dysrhythmia    Atrial Flutter- 2006- corredted itself  . Family history of adverse reaction to anesthesia    mother- with novocaine went into shock  . GERD (gastroesophageal reflux disease)   . Headache   . Hemorrhoids   . History of blood transfusion   . Hypertension   . Insomnia   . Sleep apnea    cpap  . 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 Smoking status: Former Smoker    Packs/day: 2.00    Years: 10.00    Pack years: 20.00    Types: Cigarettes    Start date: 05/16/1966    Last attempt to quit: 03/11/1984    Years  since quitting: 34.6  . Smokeless tobacco: Never Used  Substance Use Topics  . Alcohol use: Yes    Alcohol/week: 0.0 standard drinks    Comment: occasionally a couple of times a year   . Drug use: No    Family History  Problem Relation Age of Onset  . Cancer Mother   . Alcohol abuse Father    Allergies  Allergen Reactions  . Bee Venom Swelling  . Nickel Rash    OBJECTIVE: Blood pressure 115/79, pulse 92, temperature 98.4 F (36.9 C), temperature source Oral, resp. rate 20, height 5\' 8"  (1.727 m), weight 120 kg, SpO2 98 %.  Physical Exam Constitutional:      General:  He is not in acute distress.    Appearance: Normal appearance. He is well-developed. He is not ill-appearing or diaphoretic.  HENT:     Head: Normocephalic and atraumatic.     Right Ear: Hearing and external ear normal.     Left Ear: Hearing and external ear normal.     Nose: No nasal deformity or rhinorrhea.  Eyes:     General: No scleral icterus.    Conjunctiva/sclera: Conjunctivae normal.     Right eye: Right conjunctiva is not injected.     Left eye: Left conjunctiva is not injected.     Pupils: Pupils are equal, round, and reactive to light.  Neck:     Musculoskeletal: Normal range of motion and neck supple.     Vascular: No JVD.  Cardiovascular:     Rate and Rhythm: Normal rate and regular rhythm.     Heart sounds: Normal heart sounds, S1 normal and S2 normal. No murmur. No friction rub. No gallop.   Pulmonary:     Effort: Pulmonary effort is normal. No respiratory distress.     Breath sounds: Normal breath sounds. No stridor. No wheezing.  Abdominal:     General: Bowel sounds are normal. There is no distension.     Palpations: Abdomen is soft.     Tenderness: There is no abdominal tenderness.  Musculoskeletal: Normal range of motion.     Right shoulder: Normal.     Left shoulder: Normal.     Right hip: Normal.     Left hip: Normal.     Right knee: Normal.     Left knee: Normal.    Lymphadenopathy:     Head:     Right side of head: No submandibular, preauricular or posterior auricular adenopathy.     Left side of head: No submandibular, preauricular or posterior auricular adenopathy.     Cervical: No cervical adenopathy.     Right cervical: No superficial or deep cervical adenopathy.    Left cervical: No superficial or deep cervical adenopathy.  Skin:    General: Skin is warm and dry.     Coloration: Skin is not pale.     Findings: No abrasion, bruising, ecchymosis, erythema, lesion or rash.     Nails: There is no clubbing.   Neurological:     Mental Status: He is alert and oriented to person, place, and time.     Sensory: No sensory deficit.     Gait: Gait normal.  Psychiatric:        Attention and Perception: He is attentive.        Mood and Affect: Mood is anxious. Affect is angry.        Speech: Speech normal.        Behavior: Behavior normal. Behavior is cooperative.        Thought Content: Thought content normal.        Cognition and Memory: Cognition normal.        Judgment: Judgment normal.    Left hip surgical site with erythema and some warmth pictured to 2/10//2020      Lab Results Lab Results  Component Value Date   WBC 7.5 10/11/2018   HGB 11.8 (L) 10/11/2018   HCT 37.5 (L) 10/11/2018   MCV 86.6 10/11/2018   PLT 269 10/11/2018    Lab Results  Component Value Date   CREATININE 1.55 (H) 10/11/2018   BUN 68 (H) 10/11/2018   NA 132 (L) 10/11/2018   K 4.5 10/11/2018  CL 98 10/11/2018   CO2 24 10/11/2018    Lab Results  Component Value Date   ALT 11 10/01/2018   AST 19 10/01/2018   ALKPHOS 56 10/01/2018   BILITOT 0.5 10/01/2018     Microbiology: Recent Results (from the past 240 hour(s))  Blood Culture (routine x 2)     Status: None   Collection Time: 10/01/18  6:15 PM  Result Value Ref Range Status   Specimen Description BLOOD LEFT HAND  Final   Special Requests   Final    BOTTLES DRAWN AEROBIC AND ANAEROBIC Blood  Culture adequate volume   Culture   Final    NO GROWTH 5 DAYS Performed at [facility], [address] [place], [place] [address]    Report Status 10/06/2018 FINAL  Final  Blood Culture (routine x 2)     Status: None   Collection Time: 10/01/18  6:17 PM  Result Value Ref Range Status   Specimen Description BLOOD RIGHT ANTECUBITAL  Final   Special Requests   Final    BOTTLES DRAWN AEROBIC AND ANAEROBIC Blood Culture adequate volume   Culture   Final    NO GROWTH 5 DAYS Performed at [facility], [place] [address], [place], [place] [address]    Report Status 10/06/2018 FINAL  Final  Urine culture     Status: Abnormal   Collection Time: 10/01/18  6:24 PM  Result Value Ref Range Status   Specimen Description URINE, CLEAN CATCH  Final   Special Requests   Final    NONE Performed at [facility], [place] [address] [place], [place] [address]    Culture 20,000 COLONIES/mL PROTEUS MIRABILIS (A)  Final   Report Status 10/03/2018 FINAL  Final   Organism ID, Bacteria PROTEUS MIRABILIS (A)  Final      Susceptibility   Proteus mirabilis - MIC*    AMPICILLIN <=2 SENSITIVE Sensitive     CEFAZOLIN <=4 SENSITIVE Sensitive     CEFTRIAXONE <=1 SENSITIVE Sensitive     CIPROFLOXACIN <=0.25 SENSITIVE Sensitive     GENTAMICIN <=1 SENSITIVE Sensitive     IMIPENEM 8 INTERMEDIATE Intermediate     NITROFURANTOIN 128 RESISTANT Resistant     TRIMETH/SULFA <=20 SENSITIVE Sensitive     AMPICILLIN/SULBACTAM <=2 SENSITIVE Sensitive     PIP/TAZO <=4 SENSITIVE Sensitive     * 20,000 COLONIES/mL PROTEUS [place] [place] [facility] [place] [facility] [phone] pager  10/11/2018, 1:00 PM

## 2018-10-11 NOTE — Progress Notes (Signed)
Patient resting comfortably on home CPAP unit. No assistance needed from RT at this time. ?

## 2018-10-11 NOTE — Plan of Care (Signed)
Discussed with patient plan of care for the evening, pain management and when next dose can be given with some teach back displayed.

## 2018-10-11 NOTE — Progress Notes (Signed)
PROGRESS NOTE        PATIENT DETAILS Name: [name] Age: 73 y.o. Sex: male Date of Birth: 07-06-1946 Admit Date: 10/01/2018 Admitting Physician Vianne Bulls, MD KGM:[id number], Hollice Espy, MD  Brief Narrative: Patient is a 73 y.o. male with history of A. 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  Found to have a soft tissue infection of the left hip area, acute kidney injury and hyperkalemia.  Subsequently admitted to the hospitalist service.  See below for further details  Subjective:  Patient in bed, appears comfortable, denies any headache, no fever, no chest pain or pressure, no shortness of breath , no abdominal pain. No focal weakness.    Assessment/Plan:  AKI due to ATN from Infection with hyperkalemia: Hold further diuretics, unfortunately he has massive third spacing from low albumin levels arising from his underlying liver failure and NASH, however again seems to be intravascularly dehydrated, will give gentle hydration for 24 hours on 10/21/2018, added pro-stat to increase his protein levels, repeat electrolytes on 10/12/2018.  Left hip soft tissue infection /dependent edema related erythema: Soft tissue CT scan of the left hip does not show any joint involvement.  Initially received IV vancomycin in addition to Flagyl which he takes for chronic left hip prosthetic joint infection.    His left hip skin has mild erythema but no warmth, I seriously question if we are dealing with  dependent edema related erythema rather than acute infection.  Stopped IV Vancomycin on 10/06/2018 and currently on Doxycycline and chronic Flagyl combination.  No fever or leukocytosis.   His erythema has not changed since he has been here at all, I also discussed this with his orthopedic surgeon Dr. Ricki Rodriguez on 10/11/2018 who agrees that he has chronic erythema, he will see the patient tomorrow, also requested ID to see the patient on patient's request.   Again I want to reiterate my clinical opinion that patient does not have acute cellulitis this is chronic erythema in the setting of chronic underlying hip infection and dependent edema, do not think he has acute cellulitis at all.    History of left total hip arthroplasty with subsequent infection with Bacteroides fragilis: His orthopedic surgeon here in [place] is Dr. Henrietta Dine however was evaluated by orthopedics and ID and [facility] once he had left hip infection.  He is on Flagyl-with planned end date November 19, 2018.  PDX was consulted and cleared him from joint perspective on 10/04/2018.  Recurrent pleural effusion: Felt to be secondary to hepatic hydrothorax- he is being aggressively diuresed as much as we can with low blood pressures, repeat thoracentesis likely will not achieve much as effusion likely to recur due to ascites and underlying cirrhosis.  Will get CT noncontrast on 10/07/2018 to reevaluate, Aldactone, will receive Lasix and Zaroxolyn once sodium improves.  Most of his fluid is third spaced due to low albumin levels from underlying cirrhosis, now mild intravascular dehydration complicating issues.  Cryptogenic cirrhosis: Prior hepatitis serology done earlier this month was negative-agree with plans outlined in prior discharge summary for outpatient follow-up. He now has volume overload/anasarca, diurese with IV albumin and monitor  Chronic atrial fibrillation with [place] vas 2 score of at least 2: Rate controlled with  Cardizem and metoprolol, continue Eliquis.  Written parameters to hold medications for low blood pressure if needed.  Chronic back and right shoulder pain: Continue Lidoderm patch, Tylenol and as needed narcotics.  Bipolar disorder: Stable-continue Lexapro, Neurontin continued.  OSA: Continue CPAP nightly  Morbid obesity  Acute on chronic debility/deconditioning: PT requested, may require SNF.    DVT Prophylaxis: Full dose anticoagulation with Eliquis  Code Status: Full code  Family Communication: None at bedside  Disposition Plan: Remain inpatient-but will plan on Home health vs SNF on discharge  Antimicrobial agents: Anti-infectives (From admission, onward)   Start     Dose/Rate Route Frequency Ordered Stop   10/08/18 0000  doxycycline (VIBRA-TABS) 100 MG tablet     100 mg Oral Every 12 hours 10/08/18 0921     10/06/18 1000  doxycycline (VIBRA-TABS) tablet 100 mg     100 mg Oral Every 12 hours 10/06/18 0726     10/03/18 1800  metroNIDAZOLE (FLAGYL) tablet 500 mg     500 mg Oral Every 8 hours 10/03/18 1054     10/03/18 0700  vancomycin (VANCOCIN) 2,000 mg in sodium chloride 0.9 % 500 mL IVPB  Status:  Discontinued     2,000 mg 250 mL/hr over 120 Minutes Intravenous Every 36 hours 10/01/18 1942 10/06/18 0726   10/02/18 0200  ceFEPIme (MAXIPIME) 2 g in sodium chloride 0.9 % 100 mL IVPB  Status:  Discontinued     2 g 200 mL/hr over 30 Minutes Intravenous Every 8 hours 10/01/18 1942 10/02/18 1008   10/01/18 1830  vancomycin (VANCOCIN) 2,500 mg in sodium chloride 0.9 % 500 mL IVPB     2,500 mg 250 mL/hr over 120 Minutes Intravenous STAT 10/01/18 1825 10/02/18 0126   10/01/18 1800  ceFEPIme (MAXIPIME) 2 g in sodium chloride 0.9 % 100 mL IVPB     2 g 200 mL/hr over 30 Minutes Intravenous  Once 10/01/18 1747 10/01/18 1903   10/01/18 1800  metroNIDAZOLE (FLAGYL) IVPB 500 mg  Status:  Discontinued     500 mg 100 mL/hr over 60 Minutes Intravenous Every 8 hours 10/01/18 1747  10/03/18 1054   10/01/18 1800  vancomycin (VANCOCIN) IVPB 1000 mg/200 mL premix  Status:  Discontinued     1,000 mg 200 mL/hr over 60 Minutes Intravenous  Once 10/01/18 1747 10/01/18 1825      Procedures: None  CONSULTS: Ortho  Time spent: 25- minutes-Greater than 50% of this time was spent in counseling, explanation of diagnosis, planning of further management, and coordination of care.  MEDICATIONS: Scheduled Meds: . apixaban  5 mg Oral BID  . cycloSPORINE  1 drop Both Eyes BID  . diltiazem  180 mg Oral Daily  . doxycycline  100 mg Oral Q12H  . escitalopram  10 mg Oral Daily  . gabapentin  100 mg Oral TID  . lidocaine  1 patch Transdermal Q24H  . Melatonin  6 mg Oral QHS  . metroNIDAZOLE  500 mg Oral Q8H  . midodrine  10 mg Oral TID WC  . multivitamin with minerals  1 tablet Oral Daily  . pantoprazole  40 mg Oral Daily  . ENSURE MAX PROTEIN  11 oz Oral BID  . spironolactone  50 mg Oral Daily  . thiamine  100 mg Oral Daily   Continuous Infusions: . albumin human     PRN Meds:.acetaminophen, albuterol, alum & mag hydroxide-simeth, diltiazem, hydrOXYzine, menthol-cetylpyridinium, [DISCONTINUED] ondansetron **OR** ondansetron (ZOFRAN) IV, oxyCODONE, polyethylene glycol, traZODone   PHYSICAL EXAM: Vital signs: Vitals:   10/10/18 0636 10/10/18 1327 10/10/18 2100 10/11/18 0419  BP: 111/69 112/68 109/72 115/79  Pulse: (!) 105 (!) 102 79 92  Resp: 18  18 20   Temp: 98.9 F (37.2 C) 98.2 F (36.8 C) 98.9 F (37.2 C) 98.4 F (36.9 C)  TempSrc: Oral Oral Oral Oral  SpO2: 95% 100% 94% 98%  Weight:    120 kg  Height:       Filed Weights   10/09/18 0543 10/09/18 1007 10/11/18 0419  Weight: 119.4 kg 120.2 kg 120 kg   Body mass index is 40.22 kg/m.    Exam  Awake Alert, Oriented X 3, No new F.N deficits, Normal affect [place].AT,PERRAL Supple Neck,No JVD, No cervical lymphadenopathy appriciated.  Symmetrical Chest wall movement, Good air movement bilaterally,  CTAB RRR,No Gallops, Rubs or new Murmurs, No Parasternal Heave +ve B.Sounds, Abd Soft, No tenderness, No organomegaly appriciated, No rebound - guarding or rigidity. No Cyanosis,   Anasarca , L. Hip erythema as below       I have personally reviewed following labs and imaging studies  LABORATORY DATA: CBC: Recent Labs  Lab 10/08/18 0600 10/11/18 0308  WBC 7.7 7.5  HGB 11.9* 11.8*  HCT 36.7* 37.5*  MCV 86.4 86.6  PLT 261 426    Basic Metabolic Panel: Recent Labs  Lab 10/05/18 0359 10/06/18 0422 10/07/18 0343 10/08/18 0600 10/10/18 0355 10/11/18 0308  NA 136 135 135 134* 133* 132*  K 4.5 4.3 4.9 4.6 4.8 4.5  CL 104 100 99 95* 94* 98  CO2 22 22 24 25 23 24   GLUCOSE 99 99 117* 100* 121* 166*  BUN 33* 37* 44* 47* 68* 68*  CREATININE 1.07 1.25* 1.29* 1.30* 1.49* 1.55*  CALCIUM 9.1 9.1 9.4 9.7 9.6 9.2  MG 2.1  --  2.2  --   --   --     GFR: Estimated Creatinine Clearance: 54.2 mL/min (A) (by C-G formula based on SCr of 1.55 mg/dL (H)).  Liver Function Tests: No results for input(s): AST, ALT, ALKPHOS, BILITOT, PROT, ALBUMIN in the last 168 hours. No results for input(s): LIPASE, AMYLASE in the last 168 hours. No results for input(s): AMMONIA in the last 168 hours.  Coagulation Profile: No results for input(s): INR, PROTIME in the last 168 hours.  Cardiac Enzymes: No results for input(s): CKTOTAL, CKMB, CKMBINDEX, TROPONINI in the last 168 hours.  BNP (last 3 results) No results for input(s): PROBNP in the last 8760 hours.  HbA1C: No results for input(s): HGBA1C in the last 72 hours.  CBG: Recent Labs  Lab 10/07/18 0813 10/08/18 0830 10/09/18 0812 10/10/18 0752 10/11/18 0754  GLUCAP 94 99 92 109* 105*    Lipid Profile: No results for input(s): CHOL, HDL, LDLCALC, TRIG, CHOLHDL, LDLDIRECT in the last 72 hours.  Thyroid Function Tests: No results for input(s): TSH, T4TOTAL, FREET4, T3FREE, THYROIDAB in the last 72 hours.  Anemia Panel: No  results for input(s): VITAMINB12, FOLATE, FERRITIN, TIBC, IRON, RETICCTPCT in the last 72 hours.  Urine analysis:    Component Value Date/Time   COLORURINE YELLOW 10/01/2018 1822   APPEARANCEUR CLEAR 10/01/2018 1822   APPEARANCEUR Clear 01/18/2018 1539   LABSPEC 1.010 10/01/2018 1822  PHURINE 5.0 10/01/2018 1822   GLUCOSEU NEGATIVE 10/01/2018 1822   HGBUR NEGATIVE 10/01/2018 1822   BILIRUBINUR NEGATIVE 10/01/2018 1822   BILIRUBINUR Negative 01/18/2018 1539   KETONESUR NEGATIVE 10/01/2018 1822   PROTEINUR NEGATIVE 10/01/2018 1822   UROBILINOGEN 0.2 09/06/2012 0213   NITRITE NEGATIVE 10/01/2018 1822   LEUKOCYTESUR NEGATIVE 10/01/2018 1822   LEUKOCYTESUR Negative 01/18/2018 1539    Sepsis Labs: Lactic Acid, Venous    Component Value Date/Time   LATICACIDVEN 1.7 10/01/2018 1833    MICROBIOLOGY: Recent Results (from the past 240 hour(s))  Blood Culture (routine x 2)     Status: None   Collection Time: 10/01/18  6:15 PM  Result Value Ref Range Status   Specimen Description BLOOD LEFT HAND  Final   Special Requests   Final    BOTTLES DRAWN AEROBIC AND ANAEROBIC Blood Culture adequate volume   Culture   Final    NO GROWTH 5 DAYS Performed at [facility], [place] [address], [place], [place] [address]    Report Status 10/06/2018 FINAL  Final  Blood Culture (routine x 2)     Status: None   Collection Time: 10/01/18  6:17 PM  Result Value Ref Range Status   Specimen Description BLOOD RIGHT ANTECUBITAL  Final   Special Requests   Final    BOTTLES DRAWN AEROBIC AND ANAEROBIC Blood Culture adequate volume   Culture   Final    NO GROWTH 5 DAYS Performed at [facility], [place] [address], [place], [place] [address]    Report Status 10/06/2018 FINAL  Final  Urine culture     Status: Abnormal   Collection Time: 10/01/18  6:24 PM  Result Value Ref Range Status   Specimen Description URINE, CLEAN CATCH  Final   Special Requests   Final    NONE Performed at [facility], [place] [address] [place], [place] [address]    Culture 20,000 COLONIES/mL PROTEUS MIRABILIS (A)  Final   Report Status 10/03/2018 FINAL  Final   Organism ID, Bacteria PROTEUS MIRABILIS (A)  Final      Susceptibility   Proteus mirabilis - MIC*    AMPICILLIN <=2 SENSITIVE Sensitive     CEFAZOLIN <=4 SENSITIVE Sensitive     CEFTRIAXONE <=1 SENSITIVE Sensitive     CIPROFLOXACIN <=0.25 SENSITIVE Sensitive     GENTAMICIN <=1 SENSITIVE Sensitive     IMIPENEM 8 INTERMEDIATE Intermediate     NITROFURANTOIN 128 RESISTANT Resistant     TRIMETH/SULFA <=20 SENSITIVE Sensitive     AMPICILLIN/SULBACTAM <=2 SENSITIVE Sensitive     PIP/TAZO <=4 SENSITIVE Sensitive     * 20,000 COLONIES/mL PROTEUS MIRABILIS    RADIOLOGY STUDIES/RESULTS:  Dg Chest 1 View  Result Date: 10/01/2018 CLINICAL DATA:  Follow-up pneumothorax. EXAM: CHEST  1 VIEW COMPARISON:  09/09/2018 FINDINGS: Mild bilateral interstitial thickening. Small bilateral pleural effusions. Left lateral pleural thickening with a subtle lucency which may reflect a small loculated pneumothorax versus artifact. Right infrahilar hazy airspace disease likely reflecting atelectasis. Stable cardiomediastinal silhouette. No aggressive osseous lesion. Mild osteoarthritis of bilateral glenohumeral joints. IMPRESSION: Left lateral pleural thickening with a subtle lucency which may reflect a small loculated pneumothorax versus artifact. Recommend follow-up chest x-ray for re-evaluation. Bilateral interstitial thickening and small bilateral pleural effusions concerning for mild pulmonary edema. Electronically Signed   By: Kathreen Devoid   On: 10/01/2018 15:38   Ct Chest Wo Contrast  Result Date: 10/07/2018 CLINICAL DATA:  Chronic dyspnea. EXAM: CT  CHEST WITHOUT CONTRAST TECHNIQUE: Multidetector CT imaging of the chest was performed following the standard protocol without IV contrast. COMPARISON:  Portable chest obtained earlier today and chest CTA dated  08/17/2018. FINDINGS: Cardiovascular: Atheromatous calcifications, including the coronary arteries and aorta. Stable borderline enlargement of the heart with mild biatrial enlargement. No pericardial effusion. Mediastinum/Nodes: No enlarged mediastinal or axillary lymph nodes. Thyroid gland, trachea, and esophagus demonstrate no significant findings. Lungs/Pleura: Small to moderate-sized right pleural effusion. Stable left pleural thickening with calcifications. Mild bilateral atelectasis and scarring. Upper Abdomen: Post gastric bypass changes. Musculoskeletal: Mild dextroconvex thoracolumbar scoliosis. Extensive thoracic and lower cervical spine degenerative changes. IMPRESSION: 1. Small to moderate-sized right pleural effusion. 2. Mild bilateral atelectasis and scarring. 3. 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 4.  Calcific coronary artery and aortic atherosclerosis. Aortic Atherosclerosis (ICD10-I70.0). Electronically Signed   By: Claudie Revering M.D.   On: 10/07/2018 20:31   Ct Hip Left W Contrast  Result Date: 10/01/2018 CLINICAL DATA:  Left hip pain EXAM: CT OF THE LOWER LEFT EXTREMITY WITH CONTRAST TECHNIQUE: Multidetector CT imaging of the lower left extremity was performed according to the standard protocol following intravenous contrast administration. COMPARISON:  09/03/2018 CONTRAST:  168mL OMNIPAQUE IOHEXOL 300 MG/ML  SOLN FINDINGS: Bones/Joint/Cartilage An uncemented left total hip arthroplasty is identified with the tip traversing a left femoral diaphyseal fracture that is incompletely healed. Subtle residual fracture lucencies are suggested on the coronal reformats, series 6/46 through 48 for instance. There is callus formation developing about the fracture, series 3/143. Slight residual varus configuration about the fracture is seen of the femur. No new/acute appearing fracture or bone destruction is seen. With reference to the hip joint,  no significant joint effusion or abnormal lucencies to suggest loosening. The prosthetic femoral head is seated within the acetabular component without evidence of asymmetric lining wear. No soft tissue mass or mineralization. No hematoma or abnormal fluid collections. Osteoarthritis of the included SI joints and pubic symphysis. No diastasis. Ligaments Suboptimally assessed by CT. Muscles and Tendons Negative for muscle edema, hematoma or definite tear. Soft tissues No soft tissue mass, mineralization or fluid collections. Postop scarring along the lateral aspect of the hip. IMPRESSION: 1. An uncemented left total hip arthroplasty is identified with the tip traversing a left femoral diaphyseal fracture that is incompletely healed. Subtle residual fracture lucencies are suggested on the coronal reformats, series 6/46 through 48. Slight residual varus configuration of the femur about the fracture is noted. 2. No new/acute appearing fracture or bone destruction is seen. 3. No significant joint effusion to suggest septic arthritis though this is of clinical concern, percutaneous sampling of the joint fluid could be attempted. No definite evidence of hardware loosening however. Electronically Signed   By: Ashley Royalty M.D.   On: 10/01/2018 20:08   Dg Chest Port 1 View  Result Date: 10/07/2018 CLINICAL DATA:  Shortness of breath. EXAM: PORTABLE CHEST 1 VIEW COMPARISON:  10/01/2017. FINDINGS: Left lower chest incompletely imaged. Cardiomegaly with diffuse bilateral interstitial prominence and bilateral pleural effusions. Findings have progressed from prior exam are most consistent with CHF. A component of pleural scarring may be present. IMPRESSION: Congestive heart failure with bilateral interstitial edema. Bibasilar pneumonia can not be excluded. Bilateral pleural effusions. A component of pleural scarring may be present. Electronically Signed   By: Marcello Moores  Register   On: 10/07/2018 08:58   Dg Chest Port 1  View  Result Date: 10/01/2018 CLINICAL DATA:  LEFT hip replacement 6  months ago, sepsis, question LEFT leg/hip infection EXAM: PORTABLE CHEST 1 VIEW COMPARISON:  Portable exam 1756 hours compared to 10/01/2018 FINDINGS: RIGHT costophrenic angle excluded. Enlargement of cardiac silhouette. Mediastinal contours and pulmonary vascularity normal. Atherosclerotic calcification aorta. Small LEFT pleural effusion and basilar atelectasis at lower LEFT chest question partially loculated. No definite infiltrate or pneumothorax. Bones demineralized. IMPRESSION: LEFT pleural effusion and basilar atelectasis, question partially locule at the lower lateral LEFT chest. Electronically Signed   By: Lavonia Dana M.D.   On: 10/01/2018 18:05     LOS: 7 days   Signature  Lala Lund M.D on 10/11/2018 at 10:56 AM   -  To page go to [email or web address]

## 2018-10-11 NOTE — Progress Notes (Signed)
Physical Therapy Treatment Patient Details Name: [name] MRN: [id number] DOB: 12-25-1945 Today's Date: 10/11/2018    History of Present Illness Pt is a 73 year old male with PMHx significant for HTN, afib on Eliquis, OSA, morbid obesity, L THA and hip infection and history of multiple recent prolonged hospitalizations.  Pt presented to ED with generalized weakness, falls, and shortness of breath- work-up in the emergency room showed A. fib heart rate 108 and a recurrent right-sided pleural effusion    PT Comments    Patient agreeable to participation with therapist. Patient in bed when arrived. Patient requesting ambulation after he is assisted to sit at edge of bed. PT assisted patient to edge of bed for trunk. Patient required sitting at edge of bed for several minutes for dizziness to clear before attempting to stand. Patient unable to stand with assistance from a standard height bed. PT elevated bed height significantly and patient able to stand with min assist. Patient attempted to side step left and right, limited to 2-3 steps left and right as patient fatigued and unable to unload to step sideways. Upon sitting, patient complained of dizziness and nausea. Once cleared, PT assisted patient back to bed with positional use of bed in Trendelenburg to assist patient to boost up to top of bed. Patient would continue to benefit from physical therapy in current venue and recommended to below. PT will continue to follow.    Follow Up Recommendations  SNF     Equipment Recommendations  None recommended by PT    Recommendations for Other Services       Precautions / Restrictions Precautions Precautions: Fall Restrictions Weight Bearing Restrictions: No    Mobility  Bed Mobility Overal bed mobility: Needs Assistance Bed Mobility: Supine to Sit;Sit to Supine     Supine to sit: Max assist;HOB elevated Sit to supine: Max assist   General bed mobility comments: did not attempt to  reach for bed rail to assist with trunk elevation; use of bed pads to complete  Transfers Overall transfer level: Needs assistance Equipment used: Rolling walker (2 wheeled) Transfers: Sit to/from Stand Sit to Stand: Mod assist;From elevated surface            Ambulation/Gait   Gait Distance (Feet): 2 Feet Assistive device: Rolling walker (2 wheeled) Gait Pattern/deviations: Wide base of support;Trunk flexed;Decreased stride length;Step-to pattern Gait velocity: decreased   General Gait Details: limited to 2 steps laterally at bedside as very difficult for patient to unload  to move feet side to side; limited by weakness/fatigue   Stairs             Wheelchair Mobility    Modified Rankin (Stroke Patients Only)       Balance Overall balance assessment: Needs assistance Sitting-balance support: Feet supported;Bilateral upper extremity supported Sitting balance-[name]: Fair     Standing balance support: Bilateral upper extremity supported;During functional activity Standing balance-[name]: Poor Standing balance comment: w/ RW                            Cognition Arousal/Alertness: Awake/alert Behavior During Therapy: WFL for tasks assessed/performed Overall Cognitive Status: Within Functional Limits for tasks assessed                                        Exercises General Exercises - Upper Extremity Shoulder  Flexion: AROM;Strengthening;Both;10 reps General Exercises - Lower Extremity Short Arc Quad: AROM;Strengthening;Both;10 reps Heel Slides: AAROM;Strengthening;Both;10 reps    General Comments General comments (skin integrity, edema, etc.): patient with limited tolerance for mobility      Pertinent Vitals/Pain Pain Assessment: 0-10 Pain Score: 7  Pain Location: low back Pain Descriptors / Indicators: Grimacing;Guarding Pain Intervention(s): Limited activity within patient's tolerance;Monitored during  session;Repositioned    Home Living                      Prior Function            PT Goals (current goals can now be found in the care plan section) Acute Rehab PT Goals Patient Stated Goal: to get stronger and get home PT Goal Formulation: With patient Time For Goal Achievement: 10/16/18 Potential to Achieve Goals: Good Progress towards PT goals: Progressing toward goals    Frequency    Min 2X/week      PT Plan Current plan remains appropriate    Co-evaluation              AM-PAC PT "6 Clicks" Mobility   Outcome Measure  Help needed turning from your back to your side while in a flat bed without using bedrails?: A Lot Help needed moving from lying on your back to sitting on the side of a flat bed without using bedrails?: A Lot Help needed moving to and from a bed to a chair (including a wheelchair)?: A Lot Help needed standing up from a chair using your arms (e.g., wheelchair or bedside chair)?: Total Help needed to walk in hospital room?: Total Help needed climbing 3-5 steps with a railing? : Total 6 Click Score: 9    End of Session Equipment Utilized During Treatment: Gait belt Activity Tolerance: Patient limited by fatigue Patient left: in bed;with call bell/phone within reach;with bed alarm set(nursing notified) Nurse Communication: Mobility status PT Visit Diagnosis: Other abnormalities of gait and mobility (R26.89);Muscle weakness (generalized) (M62.81);Difficulty in walking, not elsewhere classified (R26.2);History of falling (Z91.81)     Time: 7001-7494 PT Time Calculation (min) (ACUTE ONLY): 30 min  Charges:  $Therapeutic Exercise: 8-22 mins $Therapeutic Activity: 8-22 mins                     Floria Raveling. Hartnett-Rands, MS, PT Per [place] [phone] 10/11/2018, 12:13 PM

## 2018-10-11 NOTE — Progress Notes (Signed)
Patient has home CPAP unit at bedside. Able to place himself on/off as needed.No assistance needed from RT at this time.   

## 2018-10-12 DIAGNOSIS — B966 Bacteroides fragilis [B. fragilis] as the cause of diseases classified elsewhere: Secondary | ICD-10-CM

## 2018-10-12 DIAGNOSIS — M00852 Arthritis due to other bacteria, left hip: Secondary | ICD-10-CM

## 2018-10-12 DIAGNOSIS — T8452XA Infection and inflammatory reaction due to internal left hip prosthesis, initial encounter: Principal | ICD-10-CM

## 2018-10-12 LAB — COMPREHENSIVE METABOLIC PANEL
ALT: 9 U/L (ref 0–44)
AST: 20 U/L (ref 15–41)
Albumin: 3.1 g/dL — ABNORMAL LOW (ref 3.5–5.0)
Alkaline Phosphatase: 45 U/L (ref 38–126)
Anion gap: 11 (ref 5–15)
BUN: 62 mg/dL — ABNORMAL HIGH (ref 8–23)
CO2: 22 mmol/L (ref 22–32)
Calcium: 9.6 mg/dL (ref 8.9–10.3)
Chloride: 97 mmol/L — ABNORMAL LOW (ref 98–111)
Creatinine, Ser: 1.35 mg/dL — ABNORMAL HIGH (ref 0.61–1.24)
GFR calc Af Amer: >60 mL/min (ref >60)
GFR calc non Af Amer: 52 mL/min — ABNORMAL LOW (ref >60)
Glucose, Bld: 139 mg/dL — ABNORMAL HIGH (ref 70–99)
Potassium: 4.6 mmol/L (ref 3.5–5.1)
Sodium: 130 mmol/L — ABNORMAL LOW (ref 135–145)
Total Bilirubin: 0.7 mg/dL (ref 0.3–1.2)
Total Protein: 6.3 g/dL — ABNORMAL LOW (ref 6.5–8.1)

## 2018-10-12 LAB — URIC ACID: Uric Acid, Serum: 9.1 mg/dL — ABNORMAL HIGH (ref 3.7–8.6)

## 2018-10-12 LAB — GLUCOSE, CAPILLARY: Glucose-Capillary: 116 mg/dL — ABNORMAL HIGH (ref 70–99)

## 2018-10-12 LAB — CBC
HCT: 36 % — ABNORMAL LOW (ref 39.0–52.0)
Hemoglobin: 11.7 g/dL — ABNORMAL LOW (ref 13.0–17.0)
MCH: 27.9 pg (ref 26.0–34.0)
MCHC: 32.5 g/dL (ref 30.0–36.0)
MCV: 85.9 fL (ref 80.0–100.0)
Platelets: 293 10*3/uL (ref 150–400)
RBC: 4.19 MIL/uL — ABNORMAL LOW (ref 4.22–5.81)
RDW: 17.5 % — ABNORMAL HIGH (ref 11.5–15.5)
WBC: 8.4 10*3/uL (ref 4.0–10.5)
nRBC: 0 % (ref 0.0–0.2)

## 2018-10-12 LAB — OSMOLALITY: Osmolality: 298 mOsm/kg — ABNORMAL HIGH (ref 275–295)

## 2018-10-12 LAB — CREATININE, URINE, RANDOM: Creatinine, Urine: 28.63 mg/dL

## 2018-10-12 LAB — OSMOLALITY, URINE: Osmolality, Ur: 281 mOsm/kg — ABNORMAL LOW (ref 300–900)

## 2018-10-12 LAB — MAGNESIUM: Magnesium: 2.4 mg/dL (ref 1.7–2.4)

## 2018-10-12 LAB — SODIUM, URINE, RANDOM: Sodium, Ur: 14 mmol/L

## 2018-10-12 MED ORDER — PROMETHAZINE HCL 25 MG/ML IJ SOLN
12.5000 mg | Freq: Once | INTRAMUSCULAR | Status: AC
  Administered 2018-10-12: 12.5 mg via INTRAVENOUS
  Filled 2018-10-12: qty 1

## 2018-10-12 NOTE — Progress Notes (Addendum)
Subjective: Patient feels as though his hip is getting infected again He has a long complex history related to his left hip. Had revision surgery last summer due to a periprosthetic fracture. Had a post-op wound infection treated with irrigation and debridement and antibiotics. Did well with ap[parent resolution of infection but then had multiple episodes of needing to 'have fluid drawn off his lungs" requiring multiple ED visits. He states that he got "very sick" and had to be emergently taken to [facility] but doesn't recall details as he said he was "out of his mind". Had an operative I & D of hid left hip there and grew B. Fragilis. Has been on po Flagyl. Has been here since 1/31. Has been treated for cellulitis but feels that his hip is getting worse. Had a CT scan showing no fluid collection in the hip. He requested that I come see him as he is concerned about recurrent infection    Objective: Vital signs in last 24 hours: Temp:  [98.2 F (36.8 C)-99.5 F (37.5 C)] 99.5 F (37.5 C) (02/11 1528) Pulse Rate:  [92-105] 105 (02/11 1528) Resp:  [18-20] 20 (02/11 1528) BP: (97-108)/(68-78) 97/68 (02/11 1528) SpO2:  [95 %] 95 % (02/11 0501) Weight:  [123.9 kg] 123.9 kg (02/11 0328)  Intake/Output from previous day: 02/10 0701 - 02/11 0700 In: 1856.3 [P.O.:595; I.V.:1261.3] Out: -  Intake/Output this shift: No intake/output data recorded.  Recent Labs    10/11/18 0308 10/12/18 0352  HGB 11.8* 11.7*   Recent Labs    10/11/18 0308 10/12/18 0352  WBC 7.5 8.4  RBC 4.33 4.19*  HCT 37.5* 36.0*  PLT 269 293   Recent Labs    10/11/18 0308 10/12/18 0352  NA 132* 130*  K 4.5 4.6  CL 98 97*  CO2 24 22  BUN 68* 62*  CREATININE 1.55* 1.35*  GLUCOSE 166* 139*  CALCIUM 9.2 9.6   No results for input(s): LABPT, INR in the last 72 hours.  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 He probably has a deep infection but it is not florid at this point. He is not a candidate for a salvage procedure such as a resection arthroplasty with 2 stage revision as his overall medical condition would make it highly unlikely that he could even survive that treatment. He does not want any further surgery. I think the most prudent thing to do is to send him for a hip aspiration in radiology to see if any fluid can be obtained and to culture it to better target antibiotic treatment. There are no plans for further surgery as suppression is the most viable treatment option in his circumstance  Please  Schedule fluoroscopic guided hip aspiration to send fluid for cell count, differential, gram stain, Culture and sensitivity     Gaynelle Arabian 10/12/2018, 7:12 PM

## 2018-10-12 NOTE — Progress Notes (Signed)
Patient is now stating that he would like to cancel his Medicaid application because he is going to receive a financial settlement. Per CSW AD, patient no longer qualifies for a Letter of Guarantee to SNF. Patient will need to be placed on the Difficult to Place waitlist due to also needing IV antibiotics. CSW will continue to follow.   [name] Frank Scarbrough LCSW [phone]

## 2018-10-12 NOTE — Progress Notes (Signed)
Subjective: No new complaints   Antibiotics:  Anti-infectives (From admission, onward)   Start     Dose/Rate Route Frequency Ordered Stop   10/08/18 0000  doxycycline (VIBRA-TABS) 100 MG tablet     100 mg Oral Every 12 hours 10/08/18 0921     10/06/18 1000  doxycycline (VIBRA-TABS) tablet 100 mg  Status:  Discontinued     100 mg Oral Every 12 hours 10/06/18 0726 10/11/18 1328   10/03/18 1800  metroNIDAZOLE (FLAGYL) tablet 500 mg     500 mg Oral Every 8 hours 10/03/18 1054     10/03/18 0700  vancomycin (VANCOCIN) 2,000 mg in sodium chloride 0.9 % 500 mL IVPB  Status:  Discontinued     2,000 mg 250 mL/hr over 120 Minutes Intravenous Every 36 hours 10/01/18 1942 10/06/18 0726   10/02/18 0200  ceFEPIme (MAXIPIME) 2 g in sodium chloride 0.9 % 100 mL IVPB  Status:  Discontinued     2 g 200 mL/hr over 30 Minutes Intravenous Every 8 hours 10/01/18 1942 10/02/18 1008   10/01/18 1830  vancomycin (VANCOCIN) 2,500 mg in sodium chloride 0.9 % 500 mL IVPB     2,500 mg 250 mL/hr over 120 Minutes Intravenous STAT 10/01/18 1825 10/02/18 0126   10/01/18 1800  ceFEPIme (MAXIPIME) 2 g in sodium chloride 0.9 % 100 mL IVPB     2 g 200 mL/hr over 30 Minutes Intravenous  Once 10/01/18 1747 10/01/18 1903   10/01/18 1800  metroNIDAZOLE (FLAGYL) IVPB 500 mg  Status:  Discontinued     500 mg 100 mL/hr over 60 Minutes Intravenous Every 8 hours 10/01/18 1747 10/03/18 1054   10/01/18 1800  vancomycin (VANCOCIN) IVPB 1000 mg/200 mL premix  Status:  Discontinued     1,000 mg 200 mL/hr over 60 Minutes Intravenous  Once 10/01/18 1747 10/01/18 1825      Medications: Scheduled Meds: . apixaban  5 mg Oral BID  . cycloSPORINE  1 drop Both Eyes BID  . diltiazem  180 mg Oral Daily  . escitalopram  10 mg Oral Daily  . feeding supplement (PRO-STAT SUGAR FREE 64)  30 mL Oral TID WC  . gabapentin  100 mg Oral TID  . lidocaine  1 patch Transdermal Q24H  . Melatonin  6 mg Oral QHS  . metroNIDAZOLE  500 mg  Oral Q8H  . midodrine  10 mg Oral TID WC  . multivitamin with minerals  1 tablet Oral Daily  . pantoprazole  40 mg Oral Daily  . ENSURE MAX PROTEIN  11 oz Oral BID  . spironolactone  50 mg Oral Daily  . thiamine  100 mg Oral Daily   Continuous Infusions: . albumin human    . dextrose 75 mL/hr at 10/11/18 2303   PRN Meds:.acetaminophen, albuterol, alum & mag hydroxide-simeth, diltiazem, hydrOXYzine, menthol-cetylpyridinium, [DISCONTINUED] ondansetron **OR** ondansetron (ZOFRAN) IV, oxyCODONE, polyethylene glycol, traZODone    Objective: Weight change: 3.9 kg  Intake/Output Summary (Last 24 hours) at 10/12/2018 0843 Last data filed at 10/12/2018 0500 Gross per 24 hour  Intake 1856.25 ml  Output -  Net 1856.25 ml   Blood pressure 102/71, pulse 92, temperature 98.2 F (36.8 C), temperature source Oral, resp. rate 18, height 5\' 8"  (1.727 m), weight 123.9 kg, SpO2 95 %. Temp:  [98.2 F (36.8 C)-98.4 F (36.9 C)] 98.2 F (36.8 C) (02/11 0501) Pulse Rate:  [85-105] 92 (02/11 0501) Resp:  [18] 18 (02/11 0501) BP: (96-108)/(69-78) 102/71 (02/11  0501) SpO2:  [95 %-98 %] 95 % (02/11 0501) Weight:  [123.9 kg] 123.9 kg (02/11 0328)  Physical Exam: General: Is asleep with CPAP but awoken answer my questions HEENT: EOMI CVS regular rate, normal  Chest: , no wheezing, no respiratory distress Abdomen: soft non-distended,  Wound not examined Neuro: nonfocal  CBC:    BMET Recent Labs    10/11/18 0308 10/12/18 0352  NA 132* 130*  K 4.5 4.6  CL 98 97*  CO2 24 22  GLUCOSE 166* 139*  BUN 68* 62*  CREATININE 1.55* 1.35*  CALCIUM 9.2 9.6     Liver Panel  Recent Labs    10/12/18 0352  PROT 6.3*  ALBUMIN 3.1*  AST 20  ALT 9  ALKPHOS 45  BILITOT 0.7       Sedimentation Rate Recent Labs    10/11/18 0947  ESRSEDRATE 53*   C-Reactive Protein Recent Labs    10/11/18 0947  CRP 4.1*    Micro Results: Recent Results (from the past 720 hour(s))  Blood  Culture (routine x 2)     Status: None   Collection Time: 10/01/18  6:15 PM  Result Value Ref Range Status   Specimen Description BLOOD LEFT HAND  Final   Special Requests   Final    BOTTLES DRAWN AEROBIC AND ANAEROBIC Blood Culture adequate volume   Culture   Final    NO GROWTH 5 DAYS Performed at [facility], [address] [place], [place] [address]    Report Status 10/06/2018 FINAL  Final  Blood Culture (routine x 2)     Status: None   Collection Time: 10/01/18  6:17 PM  Result Value Ref Range Status   Specimen Description BLOOD RIGHT ANTECUBITAL  Final   Special Requests   Final    BOTTLES DRAWN AEROBIC AND ANAEROBIC Blood Culture adequate volume   Culture   Final    NO GROWTH 5 DAYS Performed at [facility], [place] [address], [place], [place] [address]    Report Status 10/06/2018 FINAL  Final  Urine culture     Status: Abnormal   Collection Time: 10/01/18  6:24 PM  Result Value Ref Range Status   Specimen Description URINE, CLEAN CATCH  Final   Special Requests   Final    NONE Performed at [facility], [place] [address] [place], [place] [address]    Culture 20,000 COLONIES/mL PROTEUS MIRABILIS (A)  Final   Report Status 10/03/2018 FINAL  Final   Organism ID, Bacteria PROTEUS MIRABILIS (A)  Final      Susceptibility   Proteus mirabilis - MIC*    AMPICILLIN <=2 SENSITIVE Sensitive     CEFAZOLIN <=4 SENSITIVE Sensitive     CEFTRIAXONE <=1 SENSITIVE Sensitive     CIPROFLOXACIN <=0.25 SENSITIVE Sensitive     GENTAMICIN <=1 SENSITIVE Sensitive     IMIPENEM 8 INTERMEDIATE Intermediate     NITROFURANTOIN 128 RESISTANT Resistant     TRIMETH/SULFA <=20 SENSITIVE Sensitive     AMPICILLIN/SULBACTAM <=2 SENSITIVE Sensitive     PIP/TAZO <=4 SENSITIVE Sensitive     * 20,000 COLONIES/mL PROTEUS MIRABILIS    Studies/Results: No results found.    Assessment/Plan:  INTERVAL HISTORY: As mentioned had discussion with Dr. Maureen Ralphs yesterday to see the patient  today.  Principal Problem:   Hyperkalemia Active Problems:   Left hip postoperative wound infection   AKI (acute kidney injury) (HCC)   Depression   Pleural effusion on left  OSA on CPAP   Unspecified atrial fibrillation ([place])   Physical debility   Other cirrhosis of liver ([place])   Chronic back pain    [name] is a 73 y.o. male with resumed polymicrobial infection of his buttock joint with most recent surgery in November at [facility] involving debridement but retention of prosthesis cultures which yielded Bacteroides for Jiles, status post Invanz and then attempts at suppression with metronidazole now with severe pain yet again and inflammatory markers elevated.  # 1  pOLYmicrobial septic prosthetic joint:  Dr Maureen Ralphs to see, may aspirate joint for differential and culture  If the patient ends up going to the operating room I would like to withhold broader antibiotic so that we can culture deep specimens such as bone and tissue to give [place] targeted therapy    LOS: 8 days   Alcide Evener 10/12/2018, 8:43 AM

## 2018-10-12 NOTE — Progress Notes (Signed)
Patient has home CPAP at bedside.  RT checked water chamber.  Water chamber full.

## 2018-10-12 NOTE — Care Management Important Message (Signed)
Important Message  Patient Details  Name: [name] MRN: [id number] Date of Birth: [birth year]   Medicare Important Message Given:  Yes    [name] 10/12/2018, 4:27 PM

## 2018-10-12 NOTE — Progress Notes (Signed)
PROGRESS NOTE        PATIENT DETAILS Name: [name] Age: 73 y.o. Sex: male Date of Birth: 29-Oct-1945 Admit Date: 10/01/2018 Admitting Physician Vianne Bulls, MD AJO:INOMVE, Hollice Espy, MD  Brief Narrative: Patient is a 73 y.o. male with history of A. 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  Found to have a soft tissue infection of the left hip area, acute kidney injury and hyperkalemia.  Subsequently admitted to the hospitalist service.  See below for further details  Subjective:  Patient in bed, appears comfortable, denies any headache, no fever, no chest pain or pressure, no shortness of breath , no abdominal pain. No focal weakness.   Assessment/Plan:  AKI due to ATN from Infection with hyperkalemia: Initially due to dehydration improved after IV fluids, then developed third spacing of fluids due to underlying low albumin from [place], was aggressively diuresed with albumin and Lasix, edema has resolved, renal function slightly worsened again hence further diuresis has been held except low-dose Aldactone, renal function improving continue to monitor.  Hyponatremia.  After much diuresis he appears to be euvolemic at this time, will check urine sodium osmolality along with serum osmolality.  Low-dose Aldactone and repeat BMP in the morning.  Left hip soft tissue infection /dependent edema related erythema: Soft tissue CT scan done at the time of admission of the left hip does not show  any joint involvement per orthopedics which was consulted upon admission.  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  Upon admission here there was suspicion that he had developed overlying cellulitis and he was giving vancomycin and eventually switched to doxycycline, currently afebrile with no leukocytosis, he does have some erythema but this appears to be more chronic than acute.  I do not think he has an active cellulitis on top of his underlying chronic hip infection.  ID following, his primary orthopedic surgeon Dr. Ricki Rodriguez has also been consulted who will see the patient on 10/12/2018.  Defer further care to ID and orthopedics, for now on chronic Flagyl plus doxycycline prescribed here.     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 Henrietta Dine however was evaluated by orthopedics and ID and [facility] once he had left hip infection.  He is on Flagyl-with planned end date November 19, 2018.  The above.  Recurrent pleural effusion: Felt to be secondary to hepatic hydrothorax- he is being aggressively diuresed as much as we can with low blood pressures, repeat thoracentesis likely will not achieve much as effusion likely to recur due to ascites and underlying cirrhosis.  T noncontrast on 10/07/2018 reevaluated with mild to moderate effusion only, he is symptom-free from the standpoint, continue gentle diuresis as tolerated and monitor.  Cryptogenic cirrhosis: Prior hepatitis serology done earlier this month was negative-needs close outpatient GI  follow-up and monitoring.  Continue low-dose Aldactone as tolerated by electrolytes, may require Lasix intermittently as well.  Chronic atrial fibrillation with [place] vas 2 score of at least 2: Rate controlled with Cardizem and metoprolol, continue Eliquis.   Written parameters to hold medications for low blood pressure if needed.  Chronic back and right shoulder pain: Continue Lidoderm patch, Tylenol and as needed narcotics.  Bipolar disorder: Stable-continue Lexapro, Neurontin continued.  OSA: Continue CPAP nightly  Morbid obesity BMI of 41.  Follow with PCP.  Acute on chronic debility/deconditioning: PT requested, may require SNF.    DVT Prophylaxis: Full dose anticoagulation with Eliquis  Code Status: Full code  Family Communication: None at bedside  Disposition Plan: Remain inpatient-but will plan on Home health vs SNF on discharge  Antimicrobial agents: Anti-infectives (From admission, onward)   Start     Dose/Rate Route Frequency Ordered Stop   10/08/18 0000  doxycycline (VIBRA-TABS) 100 MG tablet     100 mg Oral Every 12 hours 10/08/18 0921     10/06/18 1000  doxycycline (VIBRA-TABS) tablet 100 mg  Status:  Discontinued     100 mg Oral Every 12 hours 10/06/18 0726 10/11/18 1328   10/03/18 1800  metroNIDAZOLE (FLAGYL) tablet 500 mg     500 mg Oral Every 8 hours 10/03/18 1054     10/03/18 0700  vancomycin (VANCOCIN) 2,000 mg in sodium chloride 0.9 % 500 mL IVPB  Status:  Discontinued     2,000 mg 250 mL/hr over 120 Minutes Intravenous Every 36 hours 10/01/18 1942 10/06/18 0726   10/02/18 0200  ceFEPIme (MAXIPIME) 2 g in sodium chloride 0.9 % 100 mL IVPB  Status:  Discontinued     2 g 200 mL/hr over 30 Minutes Intravenous Every 8 hours 10/01/18 1942 10/02/18 1008   10/01/18 1830  vancomycin (VANCOCIN) 2,500 mg in sodium chloride 0.9 % 500 mL IVPB     2,500 mg 250 mL/hr over 120 Minutes Intravenous STAT 10/01/18 1825 10/02/18 0126   10/01/18 1800  ceFEPIme (MAXIPIME) 2 g in sodium chloride 0.9 % 100 mL IVPB     2 g 200 mL/hr over 30 Minutes Intravenous  Once 10/01/18 1747 10/01/18 1903   10/01/18 1800  metroNIDAZOLE (FLAGYL) IVPB 500 mg  Status:  Discontinued     500 mg 100 mL/hr over 60 Minutes Intravenous Every 8  hours 10/01/18 1747 10/03/18 1054   10/01/18 1800  vancomycin (VANCOCIN) IVPB 1000 mg/200 mL premix  Status:  Discontinued     1,000 mg 200 mL/hr over 60 Minutes Intravenous  Once 10/01/18 1747 10/01/18 1825      Procedures: None  CONSULTS: Ortho, ID  Time spent: 25- minutes-Greater than 50% of this time was spent in counseling, explanation of diagnosis, planning of further management, and coordination of care.  MEDICATIONS: Scheduled Meds: . apixaban  5 mg Oral BID  . cycloSPORINE  1 drop Both Eyes BID  . diltiazem  180 mg Oral Daily  . escitalopram  10 mg Oral Daily  . feeding supplement (PRO-STAT SUGAR FREE 64)  30 mL Oral TID WC  . gabapentin  100 mg Oral TID  . lidocaine  1 patch Transdermal Q24H  . Melatonin  6 mg Oral QHS  . metroNIDAZOLE  500 mg Oral Q8H  . midodrine  10 mg Oral TID WC  . multivitamin with minerals  1 tablet Oral Daily  . pantoprazole  40 mg Oral Daily  . ENSURE MAX PROTEIN  11 oz Oral BID  .  spironolactone  50 mg Oral Daily  . thiamine  100 mg Oral Daily   Continuous Infusions:  PRN Meds:.acetaminophen, albuterol, alum & mag hydroxide-simeth, diltiazem, hydrOXYzine, menthol-cetylpyridinium, [DISCONTINUED] ondansetron **OR** ondansetron (ZOFRAN) IV, oxyCODONE, polyethylene glycol, traZODone   PHYSICAL EXAM: Vital signs: Vitals:   10/11/18 1409 10/11/18 2133 10/12/18 0328 10/12/18 0501  BP: 96/69 108/78  102/71  Pulse: 85 (!) 105  92  Resp: 18 18  18   Temp: 98.3 F (36.8 C) 98.4 F (36.9 C)  98.2 F (36.8 C)  TempSrc: Oral Oral  Oral  SpO2: 98% 95%  95%  Weight:   123.9 kg   Height:       Filed Weights   10/09/18 1007 10/11/18 0419 10/12/18 0328  Weight: 120.2 kg 120 kg 123.9 kg   Body mass index is 41.53 kg/m.    Exam  Awake Alert, Oriented X 3, No new F.N deficits, Normal affect [place].AT,PERRAL Supple Neck,No JVD, No cervical lymphadenopathy appriciated.  Symmetrical Chest wall movement, Good air movement bilaterally,  CTAB RRR,No Gallops, Rubs or new Murmurs, No Parasternal Heave +ve B.Sounds, Abd Soft, No tenderness, No organomegaly appriciated, No rebound - guarding or rigidity. No Cyanosis, edema improved , L. Hip erythema as below       I have personally reviewed following labs and imaging studies  LABORATORY DATA: CBC: Recent Labs  Lab 10/08/18 0600 10/11/18 0308 10/12/18 0352  WBC 7.7 7.5 8.4  HGB 11.9* 11.8* 11.7*  HCT 36.7* 37.5* 36.0*  MCV 86.4 86.6 85.9  PLT 261 269 811    Basic Metabolic Panel: Recent Labs  Lab 10/07/18 0343 10/08/18 0600 10/10/18 0355 10/11/18 0308 10/12/18 0352  NA 135 134* 133* 132* 130*  K 4.9 4.6 4.8 4.5 4.6  CL 99 95* 94* 98 97*  CO2 24 25 23 24 22   GLUCOSE 117* 100* 121* 166* 139*  BUN 44* 47* 68* 68* 62*  CREATININE 1.29* 1.30* 1.49* 1.55* 1.35*  CALCIUM 9.4 9.7 9.6 9.2 9.6  MG 2.2  --   --   --  2.4    GFR: Estimated Creatinine Clearance: 63.4 mL/min (A) (by C-G formula based on SCr of 1.35 mg/dL (H)).  Liver Function Tests: Recent Labs  Lab 10/12/18 0352  AST 20  ALT 9  ALKPHOS 45  BILITOT 0.7  PROT 6.3*  ALBUMIN 3.1*   No results for input(s): LIPASE, AMYLASE in the last 168 hours. No results for input(s): AMMONIA in the last 168 hours.  Coagulation Profile: No results for input(s): INR, PROTIME in the last 168 hours.  Cardiac Enzymes: No results for input(s): CKTOTAL, CKMB, CKMBINDEX, TROPONINI in the last 168 hours.  BNP (last 3 results) No results for input(s): PROBNP in the last 8760 hours.  HbA1C: No results for input(s): HGBA1C in the last 72 hours.  CBG: Recent Labs  Lab 10/08/18 0830 10/09/18 0812 10/10/18 0752 10/11/18 0754 10/12/18 0801  GLUCAP 99 92 109* 105* 116*    Lipid Profile: No results for input(s): CHOL, HDL, LDLCALC, TRIG, CHOLHDL, LDLDIRECT in the last 72 hours.  Thyroid Function Tests: No results for input(s): TSH, T4TOTAL, FREET4, T3FREE, THYROIDAB in the last 72 hours.  Anemia  Panel: No results for input(s): VITAMINB12, FOLATE, FERRITIN, TIBC, IRON, RETICCTPCT in the last 72 hours.  Urine analysis:    Component Value Date/Time   COLORURINE YELLOW 10/01/2018 1822   APPEARANCEUR CLEAR 10/01/2018 1822   APPEARANCEUR Clear 01/18/2018 1539   LABSPEC 1.010 10/01/2018 1822   PHURINE 5.0  10/01/2018 1822   GLUCOSEU NEGATIVE 10/01/2018 [place] 10/01/2018 1822   BILIRUBINUR NEGATIVE 10/01/2018 1822   BILIRUBINUR Negative 01/18/2018 1539   KETONESUR NEGATIVE 10/01/2018 1822   PROTEINUR NEGATIVE 10/01/2018 1822   UROBILINOGEN 0.2 09/06/2012 0213   NITRITE NEGATIVE 10/01/2018 1822   LEUKOCYTESUR NEGATIVE 10/01/2018 1822   LEUKOCYTESUR Negative 01/18/2018 1539    Sepsis Labs: Lactic Acid, Venous    Component Value Date/Time   LATICACIDVEN 1.7 10/01/2018 1833    MICROBIOLOGY: No results found for this or any previous visit (from the past 240 hour(s)).  RADIOLOGY STUDIES/RESULTS:  Dg Chest 1 View  Result Date: 10/01/2018 CLINICAL DATA:  Follow-up pneumothorax. EXAM: CHEST  1 VIEW COMPARISON:  09/09/2018 FINDINGS: Mild bilateral interstitial thickening. Small bilateral pleural effusions. Left lateral pleural thickening with a subtle lucency which may reflect a small loculated pneumothorax versus artifact. Right infrahilar hazy airspace disease likely reflecting atelectasis. Stable cardiomediastinal silhouette. No aggressive osseous lesion. Mild osteoarthritis of bilateral glenohumeral joints. IMPRESSION: Left lateral pleural thickening with a subtle lucency which may reflect a small loculated pneumothorax versus artifact. Recommend follow-up chest x-ray for re-evaluation. Bilateral interstitial thickening and small bilateral pleural effusions concerning for mild pulmonary edema. Electronically Signed   By: Kathreen Devoid   On: 10/01/2018 15:38   Ct Chest Wo Contrast  Result Date: 10/07/2018 CLINICAL DATA:  Chronic dyspnea. EXAM: CT CHEST WITHOUT CONTRAST  TECHNIQUE: Multidetector CT imaging of the chest was performed following the standard protocol without IV contrast. COMPARISON:  Portable chest obtained earlier today and chest CTA dated 08/17/2018. FINDINGS: Cardiovascular: Atheromatous calcifications, including the coronary arteries and aorta. Stable borderline enlargement of the heart with mild biatrial enlargement. No pericardial effusion. Mediastinum/Nodes: No enlarged mediastinal or axillary lymph nodes. Thyroid gland, trachea, and esophagus demonstrate no significant findings. Lungs/Pleura: Small to moderate-sized right pleural effusion. Stable left pleural thickening with calcifications. Mild bilateral atelectasis and scarring. Upper Abdomen: Post gastric bypass changes. Musculoskeletal: Mild dextroconvex thoracolumbar scoliosis. Extensive thoracic and lower cervical spine degenerative changes. IMPRESSION: 1. Small to moderate-sized right pleural effusion. 2. Mild bilateral atelectasis and scarring. 3. 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 4.  Calcific coronary artery and aortic atherosclerosis. Aortic Atherosclerosis (ICD10-I70.0). Electronically Signed   By: Claudie Revering M.D.   On: 10/07/2018 20:31   Ct Hip Left W Contrast  Result Date: 10/01/2018 CLINICAL DATA:  Left hip pain EXAM: CT OF THE LOWER LEFT EXTREMITY WITH CONTRAST TECHNIQUE: Multidetector CT imaging of the lower left extremity was performed according to the standard protocol following intravenous contrast administration. COMPARISON:  09/03/2018 CONTRAST:  160mL OMNIPAQUE IOHEXOL 300 MG/ML  SOLN FINDINGS: Bones/Joint/Cartilage An uncemented left total hip arthroplasty is identified with the tip traversing a left femoral diaphyseal fracture that is incompletely healed. Subtle residual fracture lucencies are suggested on the coronal reformats, series 6/46 through 48 for instance. There is callus formation developing about the fracture,  series 3/143. Slight residual varus configuration about the fracture is seen of the femur. No new/acute appearing fracture or bone destruction is seen. With reference to the hip joint, no significant joint effusion or abnormal lucencies to suggest loosening. The prosthetic femoral head is seated within the acetabular component without evidence of asymmetric lining wear. No soft tissue mass or mineralization. No hematoma or abnormal fluid collections. Osteoarthritis of the included SI joints and pubic symphysis. No diastasis. Ligaments Suboptimally assessed by CT. Muscles and Tendons Negative for muscle edema, hematoma or definite tear. Soft  tissues No soft tissue mass, mineralization or fluid collections. Postop scarring along the lateral aspect of the hip. IMPRESSION: 1. An uncemented left total hip arthroplasty is identified with the tip traversing a left femoral diaphyseal fracture that is incompletely healed. Subtle residual fracture lucencies are suggested on the coronal reformats, series 6/46 through 48. Slight residual varus configuration of the femur about the fracture is noted. 2. No new/acute appearing fracture or bone destruction is seen. 3. No significant joint effusion to suggest septic arthritis though this is of clinical concern, percutaneous sampling of the joint fluid could be attempted. No definite evidence of hardware loosening however. Electronically Signed   By: Ashley Royalty M.D.   On: 10/01/2018 20:08   Dg Chest Port 1 View  Result Date: 10/07/2018 CLINICAL DATA:  Shortness of breath. EXAM: PORTABLE CHEST 1 VIEW COMPARISON:  10/01/2017. FINDINGS: Left lower chest incompletely imaged. Cardiomegaly with diffuse bilateral interstitial prominence and bilateral pleural effusions. Findings have progressed from prior exam are most consistent with CHF. A component of pleural scarring may be present. IMPRESSION: Congestive heart failure with bilateral interstitial edema. Bibasilar pneumonia can not be  excluded. Bilateral pleural effusions. A component of pleural scarring may be present. Electronically Signed   By: Marcello Moores  Register   On: 10/07/2018 08:58   Dg Chest Port 1 View  Result Date: 10/01/2018 CLINICAL DATA:  LEFT hip replacement 6 months ago, sepsis, question LEFT leg/hip infection EXAM: PORTABLE CHEST 1 VIEW COMPARISON:  Portable exam 1756 hours compared to 10/01/2018 FINDINGS: RIGHT costophrenic angle excluded. Enlargement of cardiac silhouette. Mediastinal contours and pulmonary vascularity normal. Atherosclerotic calcification aorta. Small LEFT pleural effusion and basilar atelectasis at lower LEFT chest question partially loculated. No definite infiltrate or pneumothorax. Bones demineralized. IMPRESSION: LEFT pleural effusion and basilar atelectasis, question partially locule at the lower lateral LEFT chest. Electronically Signed   By: Lavonia Dana M.D.   On: 10/01/2018 18:05     LOS: 8 days   Signature  Lala Lund M.D on 10/12/2018 at 8:57 AM   -  To page go to [email or web address]

## 2018-10-12 NOTE — Plan of Care (Signed)
Discussed with patient plan of care for the evening, pain management and continued as needed medications needed for nausea with some teach back displayed

## 2018-10-13 ENCOUNTER — Inpatient Hospital Stay (HOSPITAL_COMMUNITY): Payer: Medicare Other

## 2018-10-13 DIAGNOSIS — T8459XD Infection and inflammatory reaction due to other internal joint prosthesis, subsequent encounter: Secondary | ICD-10-CM

## 2018-10-13 DIAGNOSIS — Z96649 Presence of unspecified artificial hip joint: Secondary | ICD-10-CM

## 2018-10-13 LAB — GLUCOSE, CAPILLARY: Glucose-Capillary: 115 mg/dL — ABNORMAL HIGH (ref 70–99)

## 2018-10-13 MED ORDER — PROMETHAZINE HCL 25 MG/ML IJ SOLN
12.5000 mg | Freq: Four times a day (QID) | INTRAMUSCULAR | Status: DC | PRN
  Administered 2018-10-13 – 2018-10-17 (×5): 12.5 mg via INTRAVENOUS
  Filled 2018-10-13 (×6): qty 1

## 2018-10-13 NOTE — Progress Notes (Signed)
Nutrition Follow-up  DOCUMENTATION CODES:   Morbid obesity  INTERVENTION:   -Continue Ensure Max BID, each supplement provides 150 kcals and 30 grams protein  -Continue MVI with minerals daily  NUTRITION DIAGNOSIS:   Increased nutrient needs related to chronic illness (cirrhosis) as evidenced by estimated needs.  Ongoing  GOAL:   Patient will meet greater than or equal to 90% of their needs  Progressing  MONITOR:   PO intake, Supplement acceptance, Labs, Weight trends, Skin, I & O's  REASON FOR ASSESSMENT:   Malnutrition Screening Tool    ASSESSMENT:   73 y.o. male with medical history significant for chronic back and right shoulder pain, OSA on CPAP, paroxysmal atrial fibrillation on Eliquis, hepatic cirrhosis, recurrent pleural effusions, and status post left total hip arthroplasty complicated by infection.  Pt was clinically ready for discharge to SNF on 2/7. However, pt developed third spacing on 2/10. Orthopedics have seen pt and determined that pt is not a candidate for a salvage procedure "as his overall medical condition would make it highly unlikely that he could even survive that treatment." Noted plan for IR-guided aspiration of hip for differential and culture.  Weight down 9 lbs since admission. Suspect weight loss related to negative fluid balance.  Spoke with pt at bedside who states that he is experiencing nausea that is "left over from breakfast." Pt reports RN is working on getting him medications to alleviate nausea. Pt shares that each time he eats, he experiences nausea. Pt states that he is still able to eat 100% of most meals.  Pt states that he is drinking the Ensure Max as ordered. Pt states that he will not take the Pro-stat. Rd to d/c order given each Ensure Max supplement contains 30 grams of protein.  Meal Completion: 50-100% x last 7 recorded meals  Medications reviewed and include: Pro-stat 30 ml TID, MVI with minerals daily, Protonix,  Ensure Max BID, thiamine  Labs reviewed: sodium 130 (L), BUN 62 (H), creatinine 1.35 (H) CBG's: 115  UOP: 900 ml x 24 hours I/O's: -5.6 L since admit  Diet Order:   Diet Order            Diet - low sodium heart healthy        Diet 2 gram sodium Room service appropriate? Yes; Fluid consistency: Thin; Fluid restriction: 1200 mL Fluid  Diet effective now              EDUCATION NEEDS:   Education needs have been addressed  Skin:  Skin Assessment: Skin Integrity Issues: Incisions: L hip Other: non-pressure wound to R buttocks  Last BM:  2/11 (small type 3)  Height:   Ht Readings from Last 1 Encounters:  10/01/18 5\' 8"  (1.727 m)    Weight:   Wt Readings from Last 1 Encounters:  10/13/18 119.3 kg    Ideal Body Weight:  70 kg  BMI:  Body mass index is 39.99 kg/m.  Estimated Nutritional Needs:   Kcal:  0160-1093  Protein:  125-140 grams  Fluid:  2.2-2.4 L    Gaynell Face, MS, RD, LDN Inpatient Clinical Dietitian Pager: [phone] Weekend/After Hours: [phone]

## 2018-10-13 NOTE — Progress Notes (Signed)
Patient ID: [name], male   DOB: 1945/10/04, 73 y.o.   MRN: [id number]  PROGRESS NOTE    JOSEEDUARDO BRIX  TLX:[id number] DOB: 04/15/1946 DOA: 10/01/2018 PCP: [name], MD   Brief Narrative:  73 year old male with history of A. 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  Found to have soft tissue infection of the left hip area, acute kidney injury and hyperkalemia. He was initially hydrated but then developed volume overload for which he was diuresed with albumin and Lasix.  ID and orthopedics were consulted for left hip infection.  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 Orthopedic evaluation appreciated.  Apparently patient is not a candidate for salvage procedures.  Orthopedics recommended IR guided aspiration and fluid analysis.  IR consult requested.  ID following.  Currently on Flagyl.  Most recent surgery in November 2019 at [facility] involving debridement but retention of prosthesis yielded Bacteroides fragilis and cultures, status post Invanz and attempts at suppression with metronidazole now.  AKI due to ATN with  hyperkalemia -Initially due to dehydration which improved after IV fluids.  Subsequently developed volume overload for which she was aggressively diuresed with IV albumin and Lasix.  Subsequently renal function became slightly worse hence Lasix was discontinued.  Currently on low-dose Aldactone.  Will repeat a.m. labs.  Strict input and output.  Daily weights.  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  Continue spironolactone for blood pressure allows.  Lasix on hold for now.  Might need intermittent Lasix as well.  Respiratory status is stable certain patient will need thoracentesis at this time.    Chronic atrial fibrillation -Rate controlled.  Continue Cardizem and Eliquis.  Chronic back and right shoulder pain -Continue Lidoderm patch, Tylenol and as needed narcotics  Bipolar disorder -Stable.  Continue Lexapro and Neurontin  OSA -Continue CPAP nightly  Morbid obesity -Follow-up with PCP  Generalized deconditioning -Patient is very deconditioned.  PT is recommending SNF.  Social worker aware and following.   DVT prophylaxis: Eliquis  code Status: Patient clearly states that he would be DNR.  Changed the CODE STATUS to DNR Family Communication: None at bedside Disposition Plan: Depends on clinical outcome  Consultants: Orthopedic/ID/IR  Procedures: None  Antimicrobials:  Anti-infectives (From admission, onward)   Start     Dose/Rate Route Frequency Ordered Stop   10/08/18 0000  doxycycline (VIBRA-TABS) 100 MG tablet     100 mg Oral Every 12 hours 10/08/18 0921     10/06/18 1000  doxycycline (VIBRA-TABS) tablet 100 mg  Status:  Discontinued  100 mg Oral Every 12 hours 10/06/18 0726 10/11/18 1328   10/03/18 1800  metroNIDAZOLE (FLAGYL) tablet 500 mg     500 mg Oral Every 8 hours 10/03/18 1054     10/03/18 0700  vancomycin (VANCOCIN) 2,000 mg  in sodium chloride 0.9 % 500 mL IVPB  Status:  Discontinued     2,000 mg 250 mL/hr over 120 Minutes Intravenous Every 36 hours 10/01/18 1942 10/06/18 0726   10/02/18 0200  ceFEPIme (MAXIPIME) 2 g in sodium chloride 0.9 % 100 mL IVPB  Status:  Discontinued     2 g 200 mL/hr over 30 Minutes Intravenous Every 8 hours 10/01/18 1942 10/02/18 1008   10/01/18 1830  vancomycin (VANCOCIN) 2,500 mg in sodium chloride 0.9 % 500 mL IVPB     2,500 mg 250 mL/hr over 120 Minutes Intravenous STAT 10/01/18 1825 10/02/18 0126   10/01/18 1800  ceFEPIme (MAXIPIME) 2 g in sodium chloride 0.9 % 100 mL IVPB     2 g 200 mL/hr over 30 Minutes Intravenous  Once 10/01/18 1747 10/01/18 1903   10/01/18 1800  metroNIDAZOLE (FLAGYL) IVPB 500 mg  Status:  Discontinued     500 mg 100 mL/hr over 60 Minutes Intravenous Every 8 hours 10/01/18 1747 10/03/18 1054   10/01/18 1800  vancomycin (VANCOCIN) IVPB 1000 mg/200 mL premix  Status:  Discontinued     1,000 mg 200 mL/hr over 60 Minutes Intravenous  Once 10/01/18 1747 10/01/18 1825      Subjective: Patient seen and examined at bedside.  He is a poor historian.  He denies worsening shortness of breath, fever, nausea or vomiting.  Feels weak and tired.   Objective: Vitals:   10/13/18 0500 10/13/18 0510 10/13/18 0512 10/13/18 0515  BP:  (!) 82/51 (!) 70/42 105/68  Pulse:  (!) 105 (!) 107 98  Resp:  16    Temp:  97.6 F (36.4 C)    TempSrc:  Oral    SpO2:  97% 97% 98%  Weight: 119.3 kg     Height:        Intake/Output Summary (Last 24 hours) at 10/13/2018 1122 Last data filed at 10/13/2018 0844 Gross per 24 hour  Intake 600 ml  Output 1400 ml  Net -800 ml   Filed Weights   10/11/18 0419 10/12/18 0328 10/13/18 0500  Weight: 120 kg 123.9 kg 119.3 kg    Examination:  General exam: Appears older than stated age.  Lying in bed.  No distress Respiratory system: Bilateral decreased breath sounds at bases with basilar crackles Cardiovascular system: S1 & S2  heard, intermittent tachycardia Gastrointestinal system: Abdomen is distended, soft and nontender. Normal bowel sounds heard. Extremities: No cyanosis, clubbing; lower extremity edema present.  Left hip erythema present  Data Reviewed: I have personally reviewed following labs and imaging studies  CBC: Recent Labs  Lab 10/08/18 0600 10/11/18 0308 10/12/18 0352  WBC 7.7 7.5 8.4  HGB 11.9* 11.8* 11.7*  HCT 36.7* 37.5* 36.0*  MCV 86.4 86.6 85.9  PLT 261 269 106   Basic Metabolic Panel: Recent Labs  Lab 10/07/18 0343 10/08/18 0600 10/10/18 0355 10/11/18 0308 10/12/18 0352  NA 135 134* 133* 132* 130*  K 4.9 4.6 4.8 4.5 4.6  CL 99 95* 94* 98 97*  CO2 24 25 23 24 22   GLUCOSE 117* 100* 121* 166* 139*  BUN 44* 47* 68* 68* 62*  CREATININE 1.29* 1.30* 1.49* 1.55* 1.35*  CALCIUM 9.4 9.7 9.6 9.2 9.6  MG 2.2  --   --   --  2.4   GFR: Estimated Creatinine Clearance: 62.1 mL/min (A) (by C-G formula based on SCr of 1.35 mg/dL (H)). Liver Function Tests: Recent Labs  Lab 10/12/18 0352  AST 20  ALT 9  ALKPHOS 45  BILITOT 0.7  PROT 6.3*  ALBUMIN 3.1*   No results for input(s): LIPASE, AMYLASE in the last 168 hours. No results for input(s): AMMONIA in the last 168 hours. Coagulation Profile: No results for input(s): INR, PROTIME in the last 168 hours. Cardiac Enzymes: No results for input(s): CKTOTAL, CKMB, CKMBINDEX, TROPONINI in the last 168 hours. BNP (last 3 results) No results for input(s): PROBNP in the last 8760 hours. HbA1C: No results for input(s): HGBA1C in the last 72 hours. CBG: Recent Labs  Lab 10/09/18 0812 10/10/18 0752 10/11/18 0754 10/12/18 0801 10/13/18 0757  GLUCAP 92 109* 105* 116* 115*   Lipid Profile: No results for input(s): CHOL, HDL, LDLCALC, TRIG, CHOLHDL, LDLDIRECT in the last 72 hours. Thyroid Function Tests: No results for input(s): TSH, T4TOTAL, FREET4, T3FREE, THYROIDAB in the last 72 hours. Anemia Panel: No results for input(s):  VITAMINB12, FOLATE, FERRITIN, TIBC, IRON, RETICCTPCT in the last 72 hours. Sepsis Labs: No results for input(s): PROCALCITON, LATICACIDVEN in the last 168 hours.  No results found for this or any previous visit (from the past 240 hour(s)).       Radiology Studies: No results found.      Scheduled Meds: . apixaban  5 mg Oral BID  . cycloSPORINE  1 drop Both Eyes BID  . diltiazem  180 mg Oral Daily  . escitalopram  10 mg Oral Daily  . feeding supplement (PRO-STAT SUGAR FREE 64)  30 mL Oral TID WC  . gabapentin  100 mg Oral TID  . lidocaine  1 patch Transdermal Q24H  . Melatonin  6 mg Oral QHS  . metroNIDAZOLE  500 mg Oral Q8H  . midodrine  10 mg Oral TID WC  . multivitamin with minerals  1 tablet Oral Daily  . pantoprazole  40 mg Oral Daily  . ENSURE MAX PROTEIN  11 oz Oral BID  . spironolactone  50 mg Oral Daily  . thiamine  100 mg Oral Daily   Continuous Infusions:   LOS: 9 days        Aline August, MD [facility] Pager [phone]  If 7PM-7AM, please contact night-coverage [email or web address] Password [facility] 10/13/2018, 11:22 AM

## 2018-10-13 NOTE — Progress Notes (Signed)
Patient has home CPAP at bedside.  RT assistance not needed at this time. 

## 2018-10-13 NOTE — Progress Notes (Signed)
Subjective: No new complaints   Antibiotics:  Anti-infectives (From admission, onward)   Start     Dose/Rate Route Frequency Ordered Stop   10/08/18 0000  doxycycline (VIBRA-TABS) 100 MG tablet     100 mg Oral Every 12 hours 10/08/18 0921     10/06/18 1000  doxycycline (VIBRA-TABS) tablet 100 mg  Status:  Discontinued     100 mg Oral Every 12 hours 10/06/18 0726 10/11/18 1328   10/03/18 1800  metroNIDAZOLE (FLAGYL) tablet 500 mg     500 mg Oral Every 8 hours 10/03/18 1054     10/03/18 0700  vancomycin (VANCOCIN) 2,000 mg in sodium chloride 0.9 % 500 mL IVPB  Status:  Discontinued     2,000 mg 250 mL/hr over 120 Minutes Intravenous Every 36 hours 10/01/18 1942 10/06/18 0726   10/02/18 0200  ceFEPIme (MAXIPIME) 2 g in sodium chloride 0.9 % 100 mL IVPB  Status:  Discontinued     2 g 200 mL/hr over 30 Minutes Intravenous Every 8 hours 10/01/18 1942 10/02/18 1008   10/01/18 1830  vancomycin (VANCOCIN) 2,500 mg in sodium chloride 0.9 % 500 mL IVPB     2,500 mg 250 mL/hr over 120 Minutes Intravenous STAT 10/01/18 1825 10/02/18 0126   10/01/18 1800  ceFEPIme (MAXIPIME) 2 g in sodium chloride 0.9 % 100 mL IVPB     2 g 200 mL/hr over 30 Minutes Intravenous  Once 10/01/18 1747 10/01/18 1903   10/01/18 1800  metroNIDAZOLE (FLAGYL) IVPB 500 mg  Status:  Discontinued     500 mg 100 mL/hr over 60 Minutes Intravenous Every 8 hours 10/01/18 1747 10/03/18 1054   10/01/18 1800  vancomycin (VANCOCIN) IVPB 1000 mg/200 mL premix  Status:  Discontinued     1,000 mg 200 mL/hr over 60 Minutes Intravenous  Once 10/01/18 1747 10/01/18 1825      Medications: Scheduled Meds: . apixaban  5 mg Oral BID  . cycloSPORINE  1 drop Both Eyes BID  . diltiazem  180 mg Oral Daily  . escitalopram  10 mg Oral Daily  . feeding supplement (PRO-STAT SUGAR FREE 64)  30 mL Oral TID WC  . gabapentin  100 mg Oral TID  . lidocaine  1 patch Transdermal Q24H  . Melatonin  6 mg Oral QHS  . metroNIDAZOLE  500 mg  Oral Q8H  . midodrine  10 mg Oral TID WC  . multivitamin with minerals  1 tablet Oral Daily  . pantoprazole  40 mg Oral Daily  . ENSURE MAX PROTEIN  11 oz Oral BID  . spironolactone  50 mg Oral Daily  . thiamine  100 mg Oral Daily   Continuous Infusions:  PRN Meds:.acetaminophen, albuterol, alum & mag hydroxide-simeth, diltiazem, hydrOXYzine, menthol-cetylpyridinium, [DISCONTINUED] ondansetron **OR** ondansetron (ZOFRAN) IV, oxyCODONE, polyethylene glycol, traZODone    Objective: Weight change: -4.6 kg  Intake/Output Summary (Last 24 hours) at 10/13/2018 1240 Last data filed at 10/13/2018 0844 Gross per 24 hour  Intake 600 ml  Output 1400 ml  Net -800 ml   Blood pressure 105/68, pulse 98, temperature 97.6 F (36.4 C), temperature source Oral, resp. rate 16, height 5\' 8"  (1.727 m), weight 119.3 kg, SpO2 98 %. Temp:  [97.6 F (36.4 C)-99.5 F (37.5 C)] 97.6 F (36.4 C) (02/12 0510) Pulse Rate:  [97-107] 98 (02/12 0515) Resp:  [16-20] 16 (02/12 0510) BP: (70-105)/(42-68) 105/68 (02/12 0515) SpO2:  [95 %-98 %] 98 % (02/12 0515) Weight:  [725.3  kg] 119.3 kg (02/12 0500)  Physical Exam: General: Is asleep with CPAP but awoken answer my questions HEENT: EOMI CVS regular rate, normal  Chest: , no wheezing, no respiratory distress Abdomen: soft non-distended,  Wound not examined Neuro: nonfocal  CBC:    BMET Recent Labs    10/11/18 0308 10/12/18 0352  NA 132* 130*  K 4.5 4.6  CL 98 97*  CO2 24 22  GLUCOSE 166* 139*  BUN 68* 62*  CREATININE 1.55* 1.35*  CALCIUM 9.2 9.6     Liver Panel  Recent Labs    10/12/18 0352  PROT 6.3*  ALBUMIN 3.1*  AST 20  ALT 9  ALKPHOS 45  BILITOT 0.7       Sedimentation Rate Recent Labs    10/11/18 0947  ESRSEDRATE 53*   C-Reactive Protein Recent Labs    10/11/18 0947  CRP 4.1*    Micro Results: Recent Results (from the past 720 hour(s))  Blood Culture (routine x 2)     Status: None   Collection Time:  10/01/18  6:15 PM  Result Value Ref Range Status   Specimen Description BLOOD LEFT HAND  Final   Special Requests   Final    BOTTLES DRAWN AEROBIC AND ANAEROBIC Blood Culture adequate volume   Culture   Final    NO GROWTH 5 DAYS Performed at [facility], [place] [address] [place], [place] [address]    Report Status 10/06/2018 FINAL  Final  Blood Culture (routine x 2)     Status: None   Collection Time: 10/01/18  6:17 PM  Result Value Ref Range Status   Specimen Description BLOOD RIGHT ANTECUBITAL  Final   Special Requests   Final    BOTTLES DRAWN AEROBIC AND ANAEROBIC Blood Culture adequate volume   Culture   Final    NO GROWTH 5 DAYS Performed at [facility], [place] [address], [place], [place] [address]    Report Status 10/06/2018 FINAL  Final  Urine culture     Status: Abnormal   Collection Time: 10/01/18  6:24 PM  Result Value Ref Range Status   Specimen Description URINE, CLEAN CATCH  Final   Special Requests   Final    NONE Performed at [facility], [place] [address] [place], [place] [address]    Culture 20,000 COLONIES/mL PROTEUS MIRABILIS (A)  Final   Report Status 10/03/2018 FINAL  Final   Organism ID, Bacteria PROTEUS MIRABILIS (A)  Final      Susceptibility   Proteus mirabilis - MIC*    AMPICILLIN <=2 SENSITIVE Sensitive     CEFAZOLIN <=4 SENSITIVE Sensitive     CEFTRIAXONE <=1 SENSITIVE Sensitive     CIPROFLOXACIN <=0.25 SENSITIVE Sensitive     GENTAMICIN <=1 SENSITIVE Sensitive     IMIPENEM 8 INTERMEDIATE Intermediate     NITROFURANTOIN 128 RESISTANT Resistant     TRIMETH/SULFA <=20 SENSITIVE Sensitive     AMPICILLIN/SULBACTAM <=2 SENSITIVE Sensitive     PIP/TAZO <=4 SENSITIVE Sensitive     * 20,000 COLONIES/mL PROTEUS MIRABILIS    Studies/Results: No results found.    Assessment/Plan:  INTERVAL HISTORY: pt to have IR guided aspirate  Principal Problem:   Prosthetic hip infection ([place]) Active Problems:   Left hip postoperative  wound infection   AKI (acute kidney injury) (H[facility])   Depression   Pleural effusion on left   OSA on CPAP   Unspecified atrial fibrillation [facility])   Physical debility  Hyperkalemia   Other cirrhosis of liver ([facility])   Chronic back pain    [name] is a 73 y.o. male with resumed polymicrobial infection of his buttock joint with most recent surgery in November at [facility] involving debridement but retention of prosthesis cultures which yielded BacteroidesFragilis, status post Invanz and then attempts at suppression with metronidazole now with severe pain yet again and inflammatory markers elevated.  # 1  pOLYmicrobial septic prosthetic joint:  Dr Maureen Ralphs seen the patient and greatly appreciate him.  He feels that surgery to cure this infection with two-stage revision is going to be very difficult to accomplish in this patient with multiple comorbidities.  The plan is to get an interventional radiology guided aspirate joint for differential and culture.  After this is done I will place the patient on broader spectrum antibiotics then his metronidazole and follow-up culture results and plan on treating him for 6 weeks with IV antibiotics followed by attempts at chronic suppression     LOS: 9 days   Alcide Evener 10/13/2018, 12:40 PM

## 2018-10-14 ENCOUNTER — Other Ambulatory Visit (HOSPITAL_COMMUNITY): Payer: Medicare Other

## 2018-10-14 DIAGNOSIS — Z7189 Other specified counseling: Secondary | ICD-10-CM

## 2018-10-14 DIAGNOSIS — Z96649 Presence of unspecified artificial hip joint: Secondary | ICD-10-CM

## 2018-10-14 DIAGNOSIS — T8459XD Infection and inflammatory reaction due to other internal joint prosthesis, subsequent encounter: Secondary | ICD-10-CM

## 2018-10-14 DIAGNOSIS — Z515 Encounter for palliative care: Secondary | ICD-10-CM

## 2018-10-14 LAB — BASIC METABOLIC PANEL
Anion gap: 10 (ref 5–15)
BUN: 55 mg/dL — ABNORMAL HIGH (ref 8–23)
CO2: 21 mmol/L — ABNORMAL LOW (ref 22–32)
Calcium: 9.5 mg/dL (ref 8.9–10.3)
Chloride: 103 mmol/L (ref 98–111)
Creatinine, Ser: 1.29 mg/dL — ABNORMAL HIGH (ref 0.61–1.24)
GFR calc Af Amer: >60 mL/min (ref >60)
GFR calc non Af Amer: 55 mL/min — ABNORMAL LOW (ref >60)
Glucose, Bld: 142 mg/dL — ABNORMAL HIGH (ref 70–99)
Potassium: 4.4 mmol/L (ref 3.5–5.1)
Sodium: 134 mmol/L — ABNORMAL LOW (ref 135–145)

## 2018-10-14 LAB — GLUCOSE, CAPILLARY
Glucose-Capillary: 92 mg/dL (ref 70–99)
Glucose-Capillary: 135 mg/dL — ABNORMAL HIGH (ref 70–99)
Glucose-Capillary: 96 mg/dL (ref 70–99)

## 2018-10-14 LAB — CBC WITH DIFFERENTIAL/PLATELET
Abs Immature Granulocytes: 0.02 10*3/uL (ref 0.00–0.07)
Basophils Absolute: 0 10*3/uL (ref 0.0–0.1)
Basophils Relative: 0 %
Eosinophils Absolute: 0.1 10*3/uL (ref 0.0–0.5)
Eosinophils Relative: 1 %
HCT: 35.3 % — ABNORMAL LOW (ref 39.0–52.0)
Hemoglobin: 11.3 g/dL — ABNORMAL LOW (ref 13.0–17.0)
Immature Granulocytes: 0 %
Lymphocytes Relative: 23 %
Lymphs Abs: 1.9 10*3/uL (ref 0.7–4.0)
MCH: 27.6 pg (ref 26.0–34.0)
MCHC: 32 g/dL (ref 30.0–36.0)
MCV: 86.3 fL (ref 80.0–100.0)
Monocytes Absolute: 0.9 10*3/uL (ref 0.1–1.0)
Monocytes Relative: 11 %
Neutro Abs: 5.1 10*3/uL (ref 1.7–7.7)
Neutrophils Relative %: 65 %
Platelets: 291 10*3/uL (ref 150–400)
RBC: 4.09 MIL/uL — ABNORMAL LOW (ref 4.22–5.81)
RDW: 17.6 % — ABNORMAL HIGH (ref 11.5–15.5)
WBC: 8 10*3/uL (ref 4.0–10.5)
nRBC: 0 % (ref 0.0–0.2)

## 2018-10-14 LAB — MAGNESIUM: Magnesium: 2.4 mg/dL (ref 1.7–2.4)

## 2018-10-14 LAB — C-REACTIVE PROTEIN: CRP: 5.4 mg/dL — ABNORMAL HIGH (ref <1.0)

## 2018-10-14 NOTE — Consult Note (Signed)
Consultation Note Date: 10/14/2018   Patient Name: [name]  DOB: 1945-11-04  MRN: [id number]  Age / Sex: 73 y.o., male  PCP: [name], MD Referring Physician: Aline August, MD  Reason for Consultation: Establishing goals of care  HPI/Patient Profile: 73 y.o. male  with past medical history of giant cell arteritis, HTN, OSA on CPAP, cryptogenic liver cirrhosis, recurrent pleural effusions, depression/anxiety, GERD, left hip periprosthetic fracture and recurrent infection currently (on long term suppressive antibiotics) admitted on 10/01/2018 with left hip red and tender and concern for recurrent infection.   Clinical Assessment and Goals of Care: I met today with [name] who is a very pleasant gentleman. We were able to discuss his struggle with hip infection and failed antibiotic therapy. He has struggled with therapy and regaining strength. He is frustrated with his lack of progress. He is appreciative of Dr. Tommy Medal working with him. He agrees with no surgical intervention.   We were able to discuss the possibility that the infection could continue even with antibiotics. He pauses to consider this and then tells me that he has already decided he does not want to be resuscitated. He chooses to be hopeful for the future but does acknowledge he knows he has complicated illness. He is able to talk about his life and not having regrets. He is working with CSW for placement and options from hospital.   We were able to discuss his family. He has a son in [place] and a daughter in [place] near his home. He has an ex-wife who is a Marine scientist and they have remained friends. We discuss HCPOA/Living Will and I provided him a packet. He would like for his ex-wife to be his HCPOA (he does not feel his children would understand his desire for DNR and does not wish to tell them this decision). He does not want to burden  his children with this responsibility and he trusts his wife with her medical background.   Therapeutic listening and emotional support provided.   Primary Decision Maker PATIENT    SUMMARY OF RECOMMENDATIONS   - [name] is hopeful but reasonable and thoughtful about his future and decisions - Plans to complete Living Will with his ex-wife as his HCPOA - Continue antibiotics, avoid surgery - Requesting OOB daily - Requesting PICC placement  Code Status/Advance Care Planning:  DNR   Symptom Management:   Pain in hip is improving and tolerable with current meds. No changes.   Having regular BM. Monitor for need for bowel regimen and would consider adding senokot daily.   Palliative Prophylaxis:   Aspiration, Bowel Regimen, Frequent Pain Assessment and Turn Reposition  Psycho-social/Spiritual:   Desire for further Chaplaincy support:yes  Additional Recommendations: Caregiving  Support/Resources  Prognosis:   Unable to determine  Discharge Planning: To Be Determined      Primary Diagnoses: Present on Admission: . Hyperkalemia . AKI (acute kidney injury) ([place]) . Pleural effusion on left . Left hip postoperative wound infection . Unspecified atrial fibrillation ([place]) .  Other cirrhosis of liver ([place]) . Depression . Physical debility . Chronic back pain   I have reviewed the medical record, interviewed the patient and family, and examined the patient. The following aspects are pertinent.  Past Medical History:  Diagnosis Date  . Anginal pain ([place]) 2006   evaluated by cardio  . Arthritis    knees,feet,shoulders,elbows.hands  . Constipation   . Dyspnea    with exertion   . Dysrhythmia    Atrial Flutter- 2006- corredted itself  . Family history of adverse reaction to anesthesia    mother- with novocaine went into shock  . GERD (gastroesophageal reflux disease)   . Headache   . Hemorrhoids   . History of blood transfusion   . Hypertension   . Insomnia    . Sleep apnea    cpap  . 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 Marital status: Divorced    Spouse name: Not on file  . Number of children: Not on file  . Years of education: Not on file  . Highest education level: Not on file  Occupational History  . Not on file  Social Needs  . Financial resource strain: Not on file  . Food insecurity:    Worry: Not on file    Inability: Not on file  . Transportation needs:    Medical: Not on file    Non-medical: Not on file  Tobacco Use  . Smoking status: Former Smoker    Packs/day: 2.00    Years: 10.00    Pack years: 20.00    Types: Cigarettes    Start date: 05/16/1966    Last attempt to quit: 03/11/1984    Years since quitting: 34.6  . Smokeless tobacco: Never Used  Substance and Sexual Activity  . Alcohol use: Yes    Alcohol/week: 0.0 standard drinks    Comment: occasionally a couple of times a year   . Drug use: No  . Sexual activity: Not on file  Lifestyle  . Physical activity:    Days per week: Not on file    Minutes per session: Not on file  . Stress: Not on file  Relationships  . Social connections:    Talks on phone: Not on file    Gets together: Not on file    Attends religious service: Not on file    Active member of club or organization: Not on file    Attends meetings of clubs or organizations: Not on file    Relationship status: Not on file  Other Topics Concern  . Not on file  Social History Narrative  . Not on file   Family History  Problem Relation Age of Onset  . Cancer Mother   . Alcohol abuse Father    Scheduled Meds: . cycloSPORINE  1 drop Both Eyes BID  . diltiazem  180 mg Oral Daily  . escitalopram  10 mg Oral Daily  . gabapentin  100 mg Oral TID  . lidocaine  1 patch Transdermal Q24H  . Melatonin  6 mg Oral QHS  . metroNIDAZOLE  500 mg Oral Q8H  . midodrine  10 mg Oral TID WC  . multivitamin with minerals  1 tablet Oral Daily  .  pantoprazole  40 mg Oral Daily  . ENSURE MAX PROTEIN  11 oz Oral BID  . spironolactone  50 mg Oral Daily  . thiamine  100 mg Oral Daily   Continuous Infusions: PRN  Meds:.acetaminophen, albuterol, alum & mag hydroxide-simeth, diltiazem, hydrOXYzine, menthol-cetylpyridinium, [DISCONTINUED] ondansetron **OR** ondansetron (ZOFRAN) IV, oxyCODONE, polyethylene glycol, promethazine, traZODone Allergies  Allergen Reactions  . Bee Venom Swelling  . Nickel Rash   Review of Systems  Constitutional: Positive for activity change.  Respiratory: Negative for shortness of breath.   Musculoskeletal: Positive for back pain.  Neurological: Positive for weakness.    Physical Exam Vitals signs and nursing note reviewed.  Constitutional:      Appearance: He is morbidly obese. He is ill-appearing.  Cardiovascular:     Rate and Rhythm: Regular rhythm.  Pulmonary:     Effort: Pulmonary effort is normal. No tachypnea, accessory muscle usage or respiratory distress.  Abdominal:     Palpations: Abdomen is soft.  Neurological:     Mental Status: He is alert and oriented to person, place, and time.     Vital Signs: BP 102/70   Pulse 93   Temp 98.5 F (36.9 C) (Oral)   Resp 16   Ht 5' 8" (1.727 m)   Wt 117.5 kg   SpO2 96%   BMI 39.39 kg/m  Pain Scale: 0-10 POSS *See Group Information*: 1-Acceptable,Awake and alert Pain Score: 5    SpO2: SpO2: 96 % O2 Device:SpO2: 96 % O2 Flow Rate: .O2 Flow Rate (L/min): 2 L/min  IO: Intake/output summary:   Intake/Output Summary (Last 24 hours) at 10/14/2018 1530 Last data filed at 10/14/2018 1526 Gross per 24 hour  Intake 760 ml  Output 1625 ml  Net -865 ml    LBM: Last BM Date: 10/12/18 Baseline Weight: Weight: 135.2 kg Most recent weight: Weight: 117.5 kg     Palliative Assessment/Data: 40%   Flowsheet Rows     Most Recent Value  Intake Tab  Referral Department  Hospitalist  Unit at Time of Referral  Intermediate Care Unit  Palliative  Care Primary Diagnosis  Other (Comment) [Prosthetic hip infection]  Date Notified  10/13/18  Palliative Care Type  New Palliative care  Reason for referral  Clarify Goals of Care  Date of Admission  10/01/18  # of days IP prior to Palliative referral  12  Clinical Assessment  Psychosocial & Spiritual Assessment  Palliative Care Outcomes      Time In: 1400 Time Out: 1510 Time Total: 70 min Greater than 50%  of this time was spent counseling and coordinating care related to the above assessment and plan.  Signed by: Vinie Sill, NP Palliative Medicine Team Pager # [phone] (M-F 8a-5p) Team Phone # [phone] (Nights/Weekends)

## 2018-10-14 NOTE — Progress Notes (Signed)
Patient ID: [name], male   DOB: 1946/07/10, 73 y.o.   MRN: [id number]  PROGRESS NOTE    [name]  PIR:[id number] DOB: [birth year] DOA: 10/01/2018 PCP: [name], MD   Brief Narrative:  73 year old male with history of A. 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  Found to have soft tissue infection of the left hip area, acute kidney injury and hyperkalemia. He was initially hydrated but then developed volume overload for which he was diuresed with albumin and Lasix.  ID and orthopedics were consulted for left hip infection.  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 Orthopedic evaluation appreciated.  Apparently patient is not a candidate for salvage procedures.  Orthopedics recommended IR guided aspiration and fluid analysis.  IR consult requested.  ID following.  Currently on Flagyl.  Most recent surgery in November 2019 at [facility] involving debridement but retention of prosthesis yielded Bacteroides fragilis and cultures, status post Invanz and attempts at suppression with metronidazole now. -IR aspiration could not be  done on 10/13/2018 because patient was on Eliquis.  Eliquis has been held.  Probable IR guided aspiration on 10/15/2018.  AKI due to ATN with hyperkalemia -Initially due to dehydration which improved after IV fluids.  Subsequently developed volume overload for which she was aggressively diuresed with IV albumin and Lasix.  Subsequently renal function became slightly worse hence Lasix was discontinued.  Currently on low-dose Aldactone.  Creatinine stable.  Will repeat a.m. labs.  Strict input and output.  Daily weights.  Continue fluid restriction.  Negative balance of 5975 cc since admission.  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  Continue spironolactone if blood pressure allows.  Lasix on hold for now.  Might need intermittent Lasix as well.  Respiratory status is stable; will hold off on thoracentesis at this time.  Chronic atrial fibrillation -Rate controlled.  Continue Cardizem and Eliquis.  Chronic back and right shoulder pain -Continue Lidoderm patch, Tylenol and as needed narcotics  Bipolar disorder -Stable.  Continue Lexapro and Neurontin  OSA -Continue CPAP nightly  Morbid obesity -Follow-up with PCP  Generalized deconditioning -Patient is very deconditioned.  PT is recommending SNF.  Social worker aware and following.   DVT prophylaxis: Eliquis  code Status: DNR  family Communication: None at bedside Disposition Plan: Depends on clinical outcome  Consultants: Orthopedic/ID/IR  Procedures: None  Antimicrobials:  Anti-infectives (From admission, onward)   Start     Dose/Rate Route Frequency Ordered Stop   10/08/18 0000  doxycycline (VIBRA-TABS) 100 MG tablet     100 mg Oral Every 12 hours 10/08/18  1610     10/06/18 1000  doxycycline (VIBRA-TABS) tablet 100 mg  Status:  Discontinued     100 mg Oral Every 12 hours 10/06/18 0726 10/11/18 1328   10/03/18 1800   metroNIDAZOLE (FLAGYL) tablet 500 mg     500 mg Oral Every 8 hours 10/03/18 1054     10/03/18 0700  vancomycin (VANCOCIN) 2,000 mg in sodium chloride 0.9 % 500 mL IVPB  Status:  Discontinued     2,000 mg 250 mL/hr over 120 Minutes Intravenous Every 36 hours 10/01/18 1942 10/06/18 0726   10/02/18 0200  ceFEPIme (MAXIPIME) 2 g in sodium chloride 0.9 % 100 mL IVPB  Status:  Discontinued     2 g 200 mL/hr over 30 Minutes Intravenous Every 8 hours 10/01/18 1942 10/02/18 1008   10/01/18 1830  vancomycin (VANCOCIN) 2,500 mg in sodium chloride 0.9 % 500 mL IVPB     2,500 mg 250 mL/hr over 120 Minutes Intravenous STAT 10/01/18 1825 10/02/18 0126   10/01/18 1800  ceFEPIme (MAXIPIME) 2 g in sodium chloride 0.9 % 100 mL IVPB     2 g 200 mL/hr over 30 Minutes Intravenous  Once 10/01/18 1747 10/01/18 1903   10/01/18 1800  metroNIDAZOLE (FLAGYL) IVPB 500 mg  Status:  Discontinued     500 mg 100 mL/hr over 60 Minutes Intravenous Every 8 hours 10/01/18 1747 10/03/18 1054   10/01/18 1800  vancomycin (VANCOCIN) IVPB 1000 mg/200 mL premix  Status:  Discontinued     1,000 mg 200 mL/hr over 60 Minutes Intravenous  Once 10/01/18 1747 10/01/18 1825       Subjective: Patient seen and examined at bedside.  He is a poor historian.  He denies any overnight fever, nausea or vomiting.  He was unable to sit on the edge of the bed yesterday as per nursing staff. Objective: Vitals:   10/13/18 1449 10/13/18 2110 10/14/18 0500 10/14/18 0526  BP: 110/73 132/74  99/74  Pulse: 78 94  87  Resp: 20 16  17   Temp: 98.6 F (37 C) 99.4 F (37.4 C)  98.7 F (37.1 C)  TempSrc: Oral Oral  Oral  SpO2: 95% 97%  94%  Weight:   117.5 kg   Height:        Intake/Output Summary (Last 24 hours) at 10/14/2018 1008 Last data filed at 10/14/2018 0843 Gross per 24 hour  Intake 1000 ml  Output 1375 ml  Net -375 ml   Filed Weights   10/12/18 0328 10/13/18 0500 10/14/18 0500  Weight: 123.9 kg 119.3 kg 117.5 kg     Examination:  General exam: Appears older than stated age.  Lying in bed.  No acute distress Respiratory system: Bilateral decreased breath sounds at bases with basilar crackles, no wheezing Cardiovascular system: S1 & S2 heard, rate controlled Gastrointestinal system: Abdomen is distended, soft and nontender. Normal bowel sounds heard. Extremities: No cyanosis; lower extremity edema present.  Left hip erythema present  Data Reviewed: I have personally reviewed following labs and imaging studies  CBC: Recent Labs  Lab 10/08/18 0600 10/11/18 0308 10/12/18 0352 10/14/18 0343  WBC 7.7 7.5 8.4 8.0  NEUTROABS  --   --   --  5.1  HGB 11.9* 11.8* 11.7* 11.3*  HCT 36.7* 37.5* 36.0* 35.3*  MCV 86.4 86.6 85.9 86.3  PLT 261 269 293 960   Basic Metabolic Panel: Recent Labs  Lab 10/08/18 0600 10/10/18 0355 10/11/18 0308 10/12/18 0352 10/14/18 0343  NA 134* 133* 132* 130* 134*  K 4.6 4.8 4.5 4.6 4.4  CL 95* 94* 98 97* 103  CO2 25 23 24 22  21*  GLUCOSE 100* 121* 166* 139* 142*  BUN 47* 68* 68* 62* 55*  CREATININE 1.30* 1.49* 1.55* 1.35* 1.29*  CALCIUM 9.7 9.6 9.2 9.6 9.5  MG  --   --   --  2.4 2.4   GFR: Estimated Creatinine Clearance: 64.4 mL/min (A) (by C-G formula based on SCr of 1.29 mg/dL (H)). Liver Function Tests: Recent Labs  Lab 10/12/18 0352  AST 20  ALT 9  ALKPHOS 45  BILITOT 0.7  PROT 6.3*  ALBUMIN 3.1*   No results for input(s): LIPASE, AMYLASE in the last 168 hours. No results for input(s): AMMONIA in the last 168 hours. Coagulation Profile: No results for input(s): INR, PROTIME in the last 168 hours. Cardiac Enzymes: No results for input(s): CKTOTAL, CKMB, CKMBINDEX, TROPONINI in the last 168 hours. BNP (last 3 results) No results for input(s): PROBNP in the last 8760 hours. HbA1C: No results for input(s): HGBA1C in the last 72 hours. CBG: Recent Labs  Lab 10/10/18 0752 10/11/18 0754 10/12/18 0801 10/13/18 0757 10/14/18 0805  GLUCAP 109*  105* 116* 115* 92   Lipid Profile: No results for input(s): CHOL, HDL, LDLCALC, TRIG, CHOLHDL, LDLDIRECT in the last 72 hours. Thyroid Function Tests: No results for input(s): TSH, T4TOTAL, FREET4, T3FREE, THYROIDAB in the last 72 hours. Anemia Panel: No results for input(s): VITAMINB12, FOLATE, FERRITIN, TIBC, IRON, RETICCTPCT in the last 72 hours. Sepsis Labs: No results for input(s): PROCALCITON, LATICACIDVEN in the last 168 hours.  No results found for this or any previous visit (from the past 240 hour(s)).       Radiology Studies: No results found.      Scheduled Meds: . cycloSPORINE  1 drop Both Eyes BID  . diltiazem  180 mg Oral Daily  . escitalopram  10 mg Oral Daily  . gabapentin  100 mg Oral TID  . lidocaine  1 patch Transdermal Q24H  . Melatonin  6 mg Oral QHS  . metroNIDAZOLE  500 mg Oral Q8H  . midodrine  10 mg Oral TID WC  . multivitamin with minerals  1 tablet Oral Daily  . pantoprazole  40 mg Oral Daily  . ENSURE MAX PROTEIN  11 oz Oral BID  . spironolactone  50 mg Oral Daily  . thiamine  100 mg Oral Daily   Continuous Infusions:   LOS: 10 days        Aline August, MD [facility] Pager [phone]  If 7PM-7AM, please contact night-coverage [email or web address] Password [email or web address] 10/14/2018, 10:08 AM

## 2018-10-14 NOTE — Progress Notes (Signed)
Physical Therapy Treatment Patient Details Name: [name] MRN: [id number] DOB: March 30, 1946 Today's Date: 10/14/2018    History of Present Illness Pt is a 73 year old male with PMHx significant for HTN, afib on Eliquis, OSA, morbid obesity, L THA and hip infection and history of multiple recent prolonged hospitalizations.  Pt presented to ED with generalized weakness, falls, and shortness of breath- work-up in the emergency room showed A. fib heart rate 108 and a recurrent right-sided pleural effusion    PT Comments    Pt continues to require mod to max assistance for mobilization.  Plan for SNF remains appropriate.    Follow Up Recommendations  SNF     Equipment Recommendations  None recommended by PT    Recommendations for Other Services       Precautions / Restrictions Precautions Precautions: Fall Restrictions Weight Bearing Restrictions: No    Mobility  Bed Mobility Overal bed mobility: Needs Assistance Bed Mobility: Supine to Sit     Supine to sit: Mod assist;Max assist;+2 for physical assistance;HOB elevated Sit to supine: +2 for physical assistance;Max assist   General bed mobility comments: Pt able to initiate movement of LEs to edge of bed he required assistance to move hips to edge of bed and to elevate trunk into sitting.  Pt reports dizziness after standing and returned to bed with max assistance for lifting LEs against gravity and for scooting to edge of bed.    Transfers Overall transfer level: Needs assistance Equipment used: Rolling walker (2 wheeled) Transfers: Sit to/from Stand Sit to Stand: Mod assist;From elevated surface;+2 safety/equipment         General transfer comment: Pt stood briefly for around 20 seconds before complaints of dizziness and requesting to return to bed.  BP in supine 127/70.    Ambulation/Gait Ambulation/Gait assistance: (NT)               Stairs             Wheelchair Mobility    Modified Rankin  (Stroke Patients Only)       Balance     Sitting balance-[name]: Poor       Standing balance-[name]: Poor                              Cognition Arousal/Alertness: Awake/alert Behavior During Therapy: Anxious Overall Cognitive Status: Within Functional Limits for tasks assessed                                 General Comments: Pt is very particular and likes to take control in the tx.        Exercises General Exercises - Lower Extremity Ankle Circles/Pumps: AROM;Both;10 reps;Supine Quad Sets: AROM;Both;10 reps;Supine Heel Slides: AROM;Both;Supine(x3 reps)    General Comments        Pertinent Vitals/Pain Pain Assessment: Faces Faces Pain Scale: Hurts whole lot Pain Location: low back Pain Descriptors / Indicators: Grimacing;Guarding Pain Intervention(s): Repositioned    Home Living                      Prior Function            PT Goals (current goals can now be found in the care plan section) Acute Rehab PT Goals Patient Stated Goal: to get stronger and get home Potential to Achieve Goals: Good Progress towards  PT goals: Progressing toward goals    Frequency    Min 2X/week      PT Plan Current plan remains appropriate    Co-evaluation              AM-PAC PT "6 Clicks" Mobility   Outcome Measure  Help needed turning from your back to your side while in a flat bed without using bedrails?: A Lot Help needed moving from lying on your back to sitting on the side of a flat bed without using bedrails?: A Lot Help needed moving to and from a bed to a chair (including a wheelchair)?: A Lot Help needed standing up from a chair using your arms (e.g., wheelchair or bedside chair)?: Total Help needed to walk in hospital room?: Total Help needed climbing 3-5 steps with a railing? : Total 6 Click Score: 9    End of Session Equipment Utilized During Treatment: Gait belt Activity Tolerance: Patient limited  by fatigue Patient left: in bed;with call bell/phone within reach;with bed alarm set Nurse Communication: Mobility status PT Visit Diagnosis: Other abnormalities of gait and mobility (R26.89);Muscle weakness (generalized) (M62.81);Difficulty in walking, not elsewhere classified (R26.2);History of falling (Z91.81)     Time: 8832-5498 PT Time Calculation (min) (ACUTE ONLY): 25 min  Charges:  $Therapeutic Activity: 23-37 mins                     Governor Rooks, PTA Acute Rehabilitation Services Pager [phone] Office [phone]     Ledarrius Beauchaine Eli Hose 10/14/2018, 6:17 PM

## 2018-10-14 NOTE — Progress Notes (Signed)
Patient has home CPAP at bedside.  RT assistance not needed at this time. 

## 2018-10-14 NOTE — Progress Notes (Signed)
Subjective: No new complaints   Antibiotics:  Anti-infectives (From admission, onward)   Start     Dose/Rate Route Frequency Ordered Stop   10/08/18 0000  doxycycline (VIBRA-TABS) 100 MG tablet     100 mg Oral Every 12 hours 10/08/18 0921     10/06/18 1000  doxycycline (VIBRA-TABS) tablet 100 mg  Status:  Discontinued     100 mg Oral Every 12 hours 10/06/18 0726 10/11/18 1328   10/03/18 1800  metroNIDAZOLE (FLAGYL) tablet 500 mg     500 mg Oral Every 8 hours 10/03/18 1054     10/03/18 0700  vancomycin (VANCOCIN) 2,000 mg in sodium chloride 0.9 % 500 mL IVPB  Status:  Discontinued     2,000 mg 250 mL/hr over 120 Minutes Intravenous Every 36 hours 10/01/18 1942 10/06/18 0726   10/02/18 0200  ceFEPIme (MAXIPIME) 2 g in sodium chloride 0.9 % 100 mL IVPB  Status:  Discontinued     2 g 200 mL/hr over 30 Minutes Intravenous Every 8 hours 10/01/18 1942 10/02/18 1008   10/01/18 1830  vancomycin (VANCOCIN) 2,500 mg in sodium chloride 0.9 % 500 mL IVPB     2,500 mg 250 mL/hr over 120 Minutes Intravenous STAT 10/01/18 1825 10/02/18 0126   10/01/18 1800  ceFEPIme (MAXIPIME) 2 g in sodium chloride 0.9 % 100 mL IVPB     2 g 200 mL/hr over 30 Minutes Intravenous  Once 10/01/18 1747 10/01/18 1903   10/01/18 1800  metroNIDAZOLE (FLAGYL) IVPB 500 mg  Status:  Discontinued     500 mg 100 mL/hr over 60 Minutes Intravenous Every 8 hours 10/01/18 1747 10/03/18 1054   10/01/18 1800  vancomycin (VANCOCIN) IVPB 1000 mg/200 mL premix  Status:  Discontinued     1,000 mg 200 mL/hr over 60 Minutes Intravenous  Once 10/01/18 1747 10/01/18 1825      Medications: Scheduled Meds: . cycloSPORINE  1 drop Both Eyes BID  . diltiazem  180 mg Oral Daily  . escitalopram  10 mg Oral Daily  . gabapentin  100 mg Oral TID  . lidocaine  1 patch Transdermal Q24H  . Melatonin  6 mg Oral QHS  . metroNIDAZOLE  500 mg Oral Q8H  . midodrine  10 mg Oral TID WC  . multivitamin with minerals  1 tablet Oral Daily    . pantoprazole  40 mg Oral Daily  . ENSURE MAX PROTEIN  11 oz Oral BID  . spironolactone  50 mg Oral Daily  . thiamine  100 mg Oral Daily   Continuous Infusions:  PRN Meds:.acetaminophen, albuterol, alum & mag hydroxide-simeth, diltiazem, hydrOXYzine, menthol-cetylpyridinium, [DISCONTINUED] ondansetron **OR** ondansetron (ZOFRAN) IV, oxyCODONE, polyethylene glycol, promethazine, traZODone    Objective: Weight change: -1.8 kg  Intake/Output Summary (Last 24 hours) at 10/14/2018 1308 Last data filed at 10/14/2018 0843 Gross per 24 hour  Intake 760 ml  Output 1375 ml  Net -615 ml   Blood pressure 109/73, pulse 87, temperature 98.7 F (37.1 C), temperature source Oral, resp. rate 17, height 5\' 8"  (1.727 m), weight 117.5 kg, SpO2 94 %. Temp:  [98.6 F (37 C)-99.4 F (37.4 C)] 98.7 F (37.1 C) (02/13 0526) Pulse Rate:  [78-94] 87 (02/13 0526) Resp:  [16-20] 17 (02/13 0526) BP: (99-132)/(73-74) 109/73 (02/13 1101) SpO2:  [94 %-97 %] 94 % (02/13 0526) Weight:  [117.5 kg] 117.5 kg (02/13 0500)  Physical Exam: General: Is asleep with CPAP but awoken answer my questions HEENT: EOMI  CVS regular rate, normal  Chest: , no wheezing, no respiratory distress Abdomen: soft non-distended,  Wound not examined Neuro: nonfocal  CBC:    BMET Recent Labs    10/12/18 0352 10/14/18 0343  NA 130* 134*  K 4.6 4.4  CL 97* 103  CO2 22 21*  GLUCOSE 139* 142*  BUN 62* 55*  CREATININE 1.35* 1.29*  CALCIUM 9.6 9.5     Liver Panel  Recent Labs    10/12/18 0352  PROT 6.3*  ALBUMIN 3.1*  AST 20  ALT 9  ALKPHOS 45  BILITOT 0.7       Sedimentation Rate No results for input(s): ESRSEDRATE in the last 72 hours. C-Reactive Protein Recent Labs    10/14/18 0343  CRP 5.4*    Micro Results: Recent Results (from the past 720 hour(s))  Blood Culture (routine x 2)     Status: None   Collection Time: 10/01/18  6:15 PM  Result Value Ref Range Status   Specimen Description  BLOOD LEFT HAND  Final   Special Requests   Final    BOTTLES DRAWN AEROBIC AND ANAEROBIC Blood Culture adequate volume   Culture   Final    NO GROWTH 5 DAYS Performed at [facility], [address], [place], [place] [address]    Report Status 10/06/2018 FINAL  Final  Blood Culture (routine x 2)     Status: None   Collection Time: 10/01/18  6:17 PM  Result Value Ref Range Status   Specimen Description BLOOD RIGHT ANTECUBITAL  Final   Special Requests   Final    BOTTLES DRAWN AEROBIC AND ANAEROBIC Blood Culture adequate volume   Culture   Final    NO GROWTH 5 DAYS Performed at [facility], [place] [address], [place], [place] [address]    Report Status 10/06/2018 FINAL  Final  Urine culture     Status: Abnormal   Collection Time: 10/01/18  6:24 PM  Result Value Ref Range Status   Specimen Description URINE, CLEAN CATCH  Final   Special Requests   Final    NONE Performed at [facility], [place] [address], [place], [place] [address]    Culture 20,000 COLONIES/mL PROTEUS MIRABILIS (A)  Final   Report Status 10/03/2018 FINAL  Final   Organism ID, Bacteria PROTEUS MIRABILIS (A)  Final      Susceptibility   Proteus mirabilis - MIC*    AMPICILLIN <=2 SENSITIVE Sensitive     CEFAZOLIN <=4 SENSITIVE Sensitive     CEFTRIAXONE <=1 SENSITIVE Sensitive     CIPROFLOXACIN <=0.25 SENSITIVE Sensitive     GENTAMICIN <=1 SENSITIVE Sensitive     IMIPENEM 8 INTERMEDIATE Intermediate     NITROFURANTOIN 128 RESISTANT Resistant     TRIMETH/SULFA <=20 SENSITIVE Sensitive     AMPICILLIN/SULBACTAM <=2 SENSITIVE Sensitive     PIP/TAZO <=4 SENSITIVE Sensitive     * 20,000 COLONIES/mL PROTEUS MIRABILIS    Studies/Results: No results found.    Assessment/Plan:  INTERVAL HISTORY: pt to have IR guided aspirate today  Principal Problem:   Prosthetic hip infection ([place]) Active Problems:   Left hip postoperative wound infection   AKI (acute kidney injury) (HCC)   Depression    Pleural effusion on left   OSA on CPAP   Unspecified atrial fibrillation (HCC)   Physical debility   Hyperkalemia   Other cirrhosis of liver ([place])   Chronic back pain    GONZALO WAYMIRE is a  73 y.o. male with resumed polymicrobial infection of his buttock joint with most recent surgery in November at [facility] involving debridement but retention of prosthesis cultures which yielded BacteroidesFragilis, status post Invanz and then attempts at suppression with metronidazole now with severe pain yet again and inflammatory markers elevated.  # 1  pOLYmicrobial septic prosthetic joint:  Dr Maureen Ralphs seen the patient and greatly appreciate him.  He feels that surgery to cure this infection with two-stage revision is going to be very difficult to accomplish in this patient with multiple comorbidities.  The plan is to get an interventional radiology guided aspirate joint for differential and culture today  After this is done I will place the patient on broader spectrum antibiotics then his metronidazole and follow-up culture results and plan on treating him for 6 weeks with IV antibiotics followed by attempts at chronic suppression     LOS: 10 days   Alcide Evener 10/14/2018, 1:08 PM

## 2018-10-15 ENCOUNTER — Inpatient Hospital Stay: Payer: Self-pay

## 2018-10-15 ENCOUNTER — Other Ambulatory Visit (HOSPITAL_COMMUNITY): Payer: Medicare Other

## 2018-10-15 ENCOUNTER — Inpatient Hospital Stay (HOSPITAL_COMMUNITY): Payer: Medicare Other

## 2018-10-15 DIAGNOSIS — T378X5S Adverse effect of other specified systemic anti-infectives and antiparasitics, sequela: Secondary | ICD-10-CM

## 2018-10-15 DIAGNOSIS — G620 Drug-induced polyneuropathy: Secondary | ICD-10-CM

## 2018-10-15 LAB — BASIC METABOLIC PANEL
Anion gap: 11 (ref 5–15)
BUN: 53 mg/dL — ABNORMAL HIGH (ref 8–23)
CO2: 23 mmol/L (ref 22–32)
Calcium: 9.9 mg/dL (ref 8.9–10.3)
Chloride: 99 mmol/L (ref 98–111)
Creatinine, Ser: 1.17 mg/dL (ref 0.61–1.24)
GFR calc Af Amer: >60 mL/min (ref >60)
GFR calc non Af Amer: >60 mL/min (ref >60)
Glucose, Bld: 98 mg/dL (ref 70–99)
Potassium: 4.6 mmol/L (ref 3.5–5.1)
Sodium: 133 mmol/L — ABNORMAL LOW (ref 135–145)

## 2018-10-15 LAB — CBC WITH DIFFERENTIAL/PLATELET
Abs Immature Granulocytes: 0.03 10*3/uL (ref 0.00–0.07)
Basophils Absolute: 0 10*3/uL (ref 0.0–0.1)
Basophils Relative: 0 %
Eosinophils Absolute: 0.1 10*3/uL (ref 0.0–0.5)
Eosinophils Relative: 1 %
HCT: 37.8 % — ABNORMAL LOW (ref 39.0–52.0)
Hemoglobin: 11.8 g/dL — ABNORMAL LOW (ref 13.0–17.0)
Immature Granulocytes: 0 %
Lymphocytes Relative: 21 %
Lymphs Abs: 1.7 10*3/uL (ref 0.7–4.0)
MCH: 27.1 pg (ref 26.0–34.0)
MCHC: 31.2 g/dL (ref 30.0–36.0)
MCV: 86.9 fL (ref 80.0–100.0)
Monocytes Absolute: 0.9 10*3/uL (ref 0.1–1.0)
Monocytes Relative: 10 %
Neutro Abs: 5.7 10*3/uL (ref 1.7–7.7)
Neutrophils Relative %: 68 %
Platelets: 323 10*3/uL (ref 150–400)
RBC: 4.35 MIL/uL (ref 4.22–5.81)
RDW: 17.7 % — ABNORMAL HIGH (ref 11.5–15.5)
WBC: 8.5 10*3/uL (ref 4.0–10.5)
nRBC: 0 % (ref 0.0–0.2)

## 2018-10-15 LAB — SYNOVIAL CELL COUNT + DIFF, W/ CRYSTALS
Crystals, Fluid: NONE SEEN
Eosinophils-Synovial: 1 % (ref 0–1)
Lymphocytes-Synovial Fld: 9 % (ref 0–20)
Monocyte-Macrophage-Synovial Fluid: 4 % — ABNORMAL LOW (ref 50–90)
Neutrophil, Synovial: 86 % — ABNORMAL HIGH (ref 0–25)
WBC, Synovial: 20 /mm3 (ref 0–200)

## 2018-10-15 LAB — MAGNESIUM: Magnesium: 2.4 mg/dL (ref 1.7–2.4)

## 2018-10-15 LAB — GLUCOSE, CAPILLARY: Glucose-Capillary: 91 mg/dL (ref 70–99)

## 2018-10-15 MED ORDER — SODIUM CHLORIDE 0.9 % IV SOLN
1.0000 g | INTRAVENOUS | Status: AC
  Administered 2018-10-15 – 2018-11-25 (×42): 1000 mg via INTRAVENOUS
  Filled 2018-10-15 (×45): qty 1

## 2018-10-15 MED ORDER — SODIUM CHLORIDE 0.9 % IV SOLN
2.0000 g | INTRAVENOUS | Status: DC
  Filled 2018-10-15: qty 20

## 2018-10-15 MED ORDER — LIDOCAINE HCL (PF) 1 % IJ SOLN
5.0000 mL | Freq: Once | INTRAMUSCULAR | Status: AC
  Administered 2018-10-15: 5 mL via INTRADERMAL
  Filled 2018-10-15 (×2): qty 5

## 2018-10-15 MED ORDER — SODIUM CHLORIDE (PF) 0.9 % IJ SOLN
10.0000 mL | Freq: Once | INTRAMUSCULAR | Status: AC
  Administered 2018-10-15: 10 mL
  Filled 2018-10-15: qty 10

## 2018-10-15 NOTE — Progress Notes (Signed)
Patient remains on [facility] wait[facility].   Cedric Fishman LCSW [phone]

## 2018-10-15 NOTE — Progress Notes (Addendum)
Subjective: No new complaints   Antibiotics:  Anti-infectives (From admission, onward)   Start     Dose/Rate Route Frequency Ordered Stop   10/15/18 1500  cefTRIAXone (ROCEPHIN) 2 g in sodium chloride 0.9 % 100 mL IVPB  Status:  Discontinued     2 g 200 mL/hr over 30 Minutes Intravenous Every 24 hours 10/15/18 1434 10/15/18 1447   10/15/18 1500  ertapenem (INVANZ) 1,000 mg in sodium chloride 0.9 % 100 mL IVPB     1 g 200 mL/hr over 30 Minutes Intravenous Every 24 hours 10/15/18 1447     10/08/18 0000  doxycycline (VIBRA-TABS) 100 MG tablet     100 mg Oral Every 12 hours 10/08/18 0921     10/06/18 1000  doxycycline (VIBRA-TABS) tablet 100 mg  Status:  Discontinued     100 mg Oral Every 12 hours 10/06/18 0726 10/11/18 1328   10/03/18 1800  metroNIDAZOLE (FLAGYL) tablet 500 mg  Status:  Discontinued     500 mg Oral Every 8 hours 10/03/18 1054 10/15/18 1447   10/03/18 0700  vancomycin (VANCOCIN) 2,000 mg in sodium chloride 0.9 % 500 mL IVPB  Status:  Discontinued     2,000 mg 250 mL/hr over 120 Minutes Intravenous Every 36 hours 10/01/18 1942 10/06/18 0726   10/02/18 0200  ceFEPIme (MAXIPIME) 2 g in sodium chloride 0.9 % 100 mL IVPB  Status:  Discontinued     2 g 200 mL/hr over 30 Minutes Intravenous Every 8 hours 10/01/18 1942 10/02/18 1008   10/01/18 1830  vancomycin (VANCOCIN) 2,500 mg in sodium chloride 0.9 % 500 mL IVPB     2,500 mg 250 mL/hr over 120 Minutes Intravenous STAT 10/01/18 1825 10/02/18 0126   10/01/18 1800  ceFEPIme (MAXIPIME) 2 g in sodium chloride 0.9 % 100 mL IVPB     2 g 200 mL/hr over 30 Minutes Intravenous  Once 10/01/18 1747 10/01/18 1903   10/01/18 1800  metroNIDAZOLE (FLAGYL) IVPB 500 mg  Status:  Discontinued     500 mg 100 mL/hr over 60 Minutes Intravenous Every 8 hours 10/01/18 1747 10/03/18 1054   10/01/18 1800  vancomycin (VANCOCIN) IVPB 1000 mg/200 mL premix  Status:  Discontinued     1,000 mg 200 mL/hr over 60 Minutes Intravenous  Once  10/01/18 1747 10/01/18 1825      Medications: Scheduled Meds: . cycloSPORINE  1 drop Both Eyes BID  . diltiazem  180 mg Oral Daily  . escitalopram  10 mg Oral Daily  . gabapentin  100 mg Oral TID  . lidocaine  1 patch Transdermal Q24H  . Melatonin  6 mg Oral QHS  . midodrine  10 mg Oral TID WC  . multivitamin with minerals  1 tablet Oral Daily  . pantoprazole  40 mg Oral Daily  . ENSURE MAX PROTEIN  11 oz Oral BID  . spironolactone  50 mg Oral Daily  . thiamine  100 mg Oral Daily   Continuous Infusions: . ertapenem     PRN Meds:.acetaminophen, albuterol, alum & mag hydroxide-simeth, diltiazem, hydrOXYzine, menthol-cetylpyridinium, [DISCONTINUED] ondansetron **OR** ondansetron (ZOFRAN) IV, oxyCODONE, polyethylene glycol, promethazine, traZODone    Objective: Weight change: 3.1 kg  Intake/Output Summary (Last 24 hours) at 10/15/2018 1607 Last data filed at 10/15/2018 1300 Gross per 24 hour  Intake 320 ml  Output 1700 ml  Net -1380 ml   Blood pressure 117/74, pulse 95, temperature (!) 97.5 F (36.4 C), temperature source Oral, resp. rate  16, height 5' 8" (1.727 m), weight 120.6 kg, SpO2 97 %. Temp:  [97.5 F (36.4 C)-98.5 F (36.9 C)] 97.5 F (36.4 C) (02/14 1547) Pulse Rate:  [95-96] 95 (02/14 1547) Resp:  [16-17] 16 (02/14 1547) BP: (112-117)/(70-76) 117/74 (02/14 1547) SpO2:  [96 %-97 %] 97 % (02/14 1547) Weight:  [120.6 kg] 120.6 kg (02/14 0500)  Physical Exam: General: Is asleep with CPAP but awoken answer my questions HEENT: EOMI CVS regular rate, normal  Chest: , no wheezing, no respiratory distress Abdomen: soft non-distended,  Wound not examined Neuro: nonfocal  CBC:    BMET Recent Labs    10/14/18 0343 10/15/18 0402  NA 134* 133*  K 4.4 4.6  CL 103 99  CO2 21* 23  GLUCOSE 142* 98  BUN 55* 53*  CREATININE 1.29* 1.17  CALCIUM 9.5 9.9     Liver Panel  No results for input(s): PROT, ALBUMIN, AST, ALT, ALKPHOS, BILITOT, BILIDIR, IBILI in  the last 72 hours.     Sedimentation Rate No results for input(s): ESRSEDRATE in the last 72 hours. C-Reactive Protein Recent Labs    10/14/18 0343  CRP 5.4*    Micro Results: Recent Results (from the past 720 hour(s))  Blood Culture (routine x 2)     Status: None   Collection Time: 10/01/18  6:15 PM  Result Value Ref Range Status   Specimen Description BLOOD LEFT HAND  Final   Special Requests   Final    BOTTLES DRAWN AEROBIC AND ANAEROBIC Blood Culture adequate volume   Culture   Final    NO GROWTH 5 DAYS Performed at [facility], [address], [place], [place] [address]    Report Status 10/06/2018 FINAL  Final  Blood Culture (routine x 2)     Status: None   Collection Time: 10/01/18  6:17 PM  Result Value Ref Range Status   Specimen Description BLOOD RIGHT ANTECUBITAL  Final   Special Requests   Final    BOTTLES DRAWN AEROBIC AND ANAEROBIC Blood Culture adequate volume   Culture   Final    NO GROWTH 5 DAYS Performed at [facility], [place] [address], [place], [place] [address]    Report Status 10/06/2018 FINAL  Final  Urine culture     Status: Abnormal   Collection Time: 10/01/18  6:24 PM  Result Value Ref Range Status   Specimen Description URINE, CLEAN CATCH  Final   Special Requests   Final    NONE Performed at [facility], [place] [address], [place], [place] [address]    Culture 20,000 COLONIES/mL PROTEUS MIRABILIS (A)  Final   Report Status 10/03/2018 FINAL  Final   Organism ID, Bacteria PROTEUS MIRABILIS (A)  Final      Susceptibility   Proteus mirabilis - MIC*    AMPICILLIN <=2 SENSITIVE Sensitive     CEFAZOLIN <=4 SENSITIVE Sensitive     CEFTRIAXONE <=1 SENSITIVE Sensitive     CIPROFLOXACIN <=0.25 SENSITIVE Sensitive     GENTAMICIN <=1 SENSITIVE Sensitive     IMIPENEM 8 INTERMEDIATE Intermediate     NITROFURANTOIN 128 RESISTANT Resistant     TRIMETH/SULFA <=20 SENSITIVE Sensitive     AMPICILLIN/SULBACTAM <=2 SENSITIVE Sensitive       PIP/TAZO <=4 SENSITIVE Sensitive     * 20,000 COLONIES/mL PROTEUS MIRABILIS  Body fluid culture     Status: None (Preliminary result)   Collection Time: 10/15/18 10:22 AM  Result Value Ref Range Status  Specimen Description SYNOVIAL LEFT HIP  Final   Special Requests   Final    NONE Performed at [facility], [place] [address], [place], [place] [address]    Gram Stain NO WBC SEEN NO ORGANISMS SEEN   Final   Culture PENDING  Incomplete   Report Status PENDING  Incomplete    Studies/Results: Dg Fluoro Guided Needle Plc Aspiration/injection Loc  Result Date: 10/15/2018 CLINICAL DATA:  Left hip pain, evaluate for fluid collection EXAM: LEFT HIP ASPIRATION UNDER FLUOROSCOPY FLUOROSCOPY TIME:  Fluoroscopy Time:  18 seconds Radiation Exposure Index (if provided by the fluoroscopic device): 2.3 mGy Number of Acquired Spot Images: 1 PROCEDURE: Overlying skin prepped with Betadine, draped in the usual sterile fashion, and infiltrated locally with buffered Lidocaine. 20 gauge spinal needle advanced to the prosthetic junction of the femoral head/neck. 1 ml of Lidocaine injected easily. No fluid/frank infection could be aspirated. 4 mL Lidocaine and 6 mL sterile saline administered into the joint with attempted subsequent aspiration. Less than 1 mL fluid was aspirated and sent for laboratory evaluation. No immediate complication. IMPRESSION: Technically successful left hip aspiration under fluoroscopy, as described above. Electronically Signed   By: Julian Hy M.D.   On: 10/15/2018 11:18   [place] Ekg Site Rite  Result Date: 10/15/2018 If Site Rite image not attached, placement could not be confirmed due to current cardiac rhythm.     Assessment/Plan:  INTERVAL HISTORY: Had IR aspiration of the joint which has 20 white cells consistent with a prosthetic joint infection.  Cultures are incubating  Principal Problem:   Prosthetic hip infection (HCC) Active Problems:   Left hip  postoperative wound infection   AKI (acute kidney injury) (HCC)   Depression   Pleural effusion on left   OSA on CPAP   Unspecified atrial fibrillation (HCC)   Physical debility   Hyperkalemia   Other cirrhosis of liver (HCC)   Chronic back pain   Goals of care, counseling/discussion   Palliative care encounter    MAIKEL NEISLER is a 73 y.o. male with resumed polymicrobial infection of his buttock joint with most recent surgery in November at [facility] involving debridement but retention of prosthesis cultures which yielded BacteroidesFragilis, status post Invanz and then attempts at suppression with metronidazole now with severe pain yet again and inflammatory markers elevated.  # 1  pOLYmicrobial septic prosthetic joint:  Dr Maureen Ralphs seen the patient and greatly appreciate him.  Changing to ertapenem and will plan on giving him a 6-week course of this followed by oral chronic suppressive therapy  #2 metronidazole induced neuropathy: Hopefully the IV antibiotics with Colbert Ewing will give him a holiday from this symptom.  I will followup cutures and arrange HSFU in my clinic  He needs PICC line and dc either to SNF vs with home health   Diagnosis: Polymicrobial prosthetic joint infection  Culture Result: Pending  Allergies  Allergen Reactions  . Bee Venom Swelling  . Nickel Rash    OPAT Orders Discharge antibiotics: 1 g daily ertapenem   duration: 6 weeks End Date:  November 25, 2018 [facility] Care Per Protocol:  Labs weekly while on IV antibiotics: _x_ CBC with differential _x_ BMP  _x_ CRP _x_ ESR   __ Please pull PIC at completion of IV antibiotics _x_ Please leave PIC in place until doctor has seen patient or been notified  Fax weekly labs to [phone]  Clinic Follow Up Appt:  March 18th, 2020 @ [facility]  LOS: 11 days   Alcide Evener 10/15/2018, 4:07 PM

## 2018-10-15 NOTE — Progress Notes (Signed)
   10/15/18 1400  Clinical Encounter Type  Visited With Patient  Visit Type Initial  Referral From Nurse  Consult/Referral To Chaplain  Spiritual Encounters  Spiritual Needs Prayer  Stress Factors  Patient Stress Factors Health changes   Followed up on previous Chaplain for completing the AD. Notary and witnesses were present. PT was alert and AD was signed. Nurse scanned AD into Med file and I gave PT hard copy and extra copy to patient. I offered spiritual care wit word of encouragement, a listening ear and prayer. PT was thankful for Chaplain presence. Nurse was very professional. Clinical biochemist available as needed.  Chaplain Fidel Levy [phone]

## 2018-10-15 NOTE — Progress Notes (Signed)
Chaplain phoned 5W for Pt. update.  Pt. is in radiology at this time and unable to complete AD consult.  Unit expects his return in approximately an hour. The chaplain will F/U with spiritual care as needed.

## 2018-10-15 NOTE — Progress Notes (Signed)
Pt brought in home CPAP equipment. Pt unable to tell RT what his setting is at this time. RT checked pts H2O and filled for pt. RT will continue to monitor.

## 2018-10-15 NOTE — Progress Notes (Signed)
Patient refusing to take IV antibiotic through PIV. IS requesting PICC line be placed before antibiotics started. Educated patient on importance of receiving antibiotics.

## 2018-10-15 NOTE — Progress Notes (Signed)
Patient ID: NAS WAFER, male   DOB: [birth year], 73 y.o.   MRN: [id number]  PROGRESS NOTE    [name]  YQM:[id number] DOB: 1945/12/16 DOA: 10/01/2018 PCP: [name], MD   Brief Narrative:  73 year old male with history of A. 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  Found to have soft tissue infection of the left hip area, acute kidney injury and hyperkalemia. He was initially hydrated but then developed volume overload for which he was diuresed with albumin and Lasix.  ID and orthopedics were consulted for left hip infection.  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 Orthopedic evaluation appreciated.  Apparently patient is not a candidate for salvage procedures.  Orthopedics recommended IR guided aspiration and fluid analysis.  IR consult requested.  ID following.  Currently on Flagyl.  Most recent surgery in November 2019 at [facility] involving debridement but retention of prosthesis yielded Bacteroides fragilis and cultures, status post Invanz and  attempts at suppression with metronidazole now. -  Probable IR guided aspiration today.  After aspiration, ID is planning to start IV antibiotics for 6 weeks.  AKI due to ATN with hyperkalemia -Initially due to dehydration which improved after IV fluids.  Subsequently developed volume overload for which he was aggressively diuresed with IV albumin and Lasix.  Subsequently renal function became slightly worse hence Lasix was discontinued.  Currently on low-dose Aldactone.  Creatinine stable.  Will repeat a.m. labs.  Strict input and output.  Daily weights.  Continue fluid restriction.  Negative balance of 7255 cc since admission.  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  Continue spironolactone if blood pressure allows.  Lasix on hold for now.  Might need intermittent Lasix as well.  Respiratory status is stable; will hold off on thoracentesis at this time.  Chronic atrial fibrillation -Rate controlled.  Continue Cardizem and Eliquis.  Chronic back and right shoulder pain -Continue Lidoderm patch, Tylenol and as needed narcotics  Bipolar disorder -Stable.  Continue Lexapro and Neurontin  OSA -Continue CPAP nightly  Morbid obesity -Follow-up with PCP  Generalized deconditioning -Patient is very deconditioned.  PT is recommending SNF.  Social worker aware and following.   DVT prophylaxis: Eliquis on hold for hip aspiration code Status: DNR  family Communication: None at bedside Disposition Plan: SNF in the next few days once cleared by ID and once we have an IV antibiotic plan  Consultants: Orthopedic/ID/IR  Procedures: None  Antimicrobials:  Anti-infectives (From admission, onward)   Start     Dose/Rate Route  Frequency Ordered Stop   10/08/18 0000  doxycycline (VIBRA-TABS) 100 MG tablet     100 mg Oral Every 12 hours 10/08/18 0921     10/06/18 1000  doxycycline (VIBRA-TABS)  tablet 100 mg  Status:  Discontinued     100 mg Oral Every 12 hours 10/06/18 0726 10/11/18 1328   10/03/18 1800  metroNIDAZOLE (FLAGYL) tablet 500 mg     500 mg Oral Every 8 hours 10/03/18 1054     10/03/18 0700  vancomycin (VANCOCIN) 2,000 mg in sodium chloride 0.9 % 500 mL IVPB  Status:  Discontinued     2,000 mg 250 mL/hr over 120 Minutes Intravenous Every 36 hours 10/01/18 1942 10/06/18 0726   10/02/18 0200  ceFEPIme (MAXIPIME) 2 g in sodium chloride 0.9 % 100 mL IVPB  Status:  Discontinued     2 g 200 mL/hr over 30 Minutes Intravenous Every 8 hours 10/01/18 1942 10/02/18 1008   10/01/18 1830  vancomycin (VANCOCIN) 2,500 mg in sodium chloride 0.9 % 500 mL IVPB     2,500 mg 250 mL/hr over 120 Minutes Intravenous STAT 10/01/18 1825 10/02/18 0126   10/01/18 1800  ceFEPIme (MAXIPIME) 2 g in sodium chloride 0.9 % 100 mL IVPB     2 g 200 mL/hr over 30 Minutes Intravenous  Once 10/01/18 1747 10/01/18 1903   10/01/18 1800  metroNIDAZOLE (FLAGYL) IVPB 500 mg  Status:  Discontinued     500 mg 100 mL/hr over 60 Minutes Intravenous Every 8 hours 10/01/18 1747 10/03/18 1054   10/01/18 1800  vancomycin (VANCOCIN) IVPB 1000 mg/200 mL premix  Status:  Discontinued     1,000 mg 200 mL/hr over 60 Minutes Intravenous  Once 10/01/18 1747 10/01/18 1825        Subjective: Patient seen and examined at bedside.  He is a poor historian.  He denies worsening hip pain.  No overnight fever or vomiting.  He is very weak and hardly can get out of bed.  Objective: Vitals:   10/14/18 1525 10/14/18 2102 10/15/18 0500 10/15/18 0601  BP: 102/70 112/76  113/72  Pulse: 93 96  95  Resp: 16 17  17   Temp: 98.5 F (36.9 C) 98.5 F (36.9 C)  97.8 F (36.6 C)  TempSrc: Oral Oral  Oral  SpO2: 96% 97%  96%  Weight:   120.6 kg   Height:        Intake/Output Summary (Last 24 hours) at 10/15/2018 0857 Last data filed at 10/15/2018 0217 Gross per 24 hour  Intake 320 ml  Output 1600 ml  Net -1280 ml   Filed  Weights   10/13/18 0500 10/14/18 0500 10/15/18 0500  Weight: 119.3 kg 117.5 kg 120.6 kg    Examination:  General exam: Appears older than stated age.  Lying in bed.  No distress Respiratory system: Bilateral decreased breath sounds at bases with basilar crackles Cardiovascular system: Rate controlled, S1-S2 heard Gastrointestinal system: Abdomen is distended, soft and nontender. Normal bowel sounds heard. Extremities: No cyanosis; lower extremity edema present.  Left hip erythema present  Data Reviewed: I have personally reviewed following labs and imaging studies  CBC: Recent Labs  Lab 10/11/18 0308 10/12/18 0352 10/14/18 0343 10/15/18 0402  WBC 7.5 8.4 8.0 8.5  NEUTROABS  --   --  5.1 5.7  HGB 11.8* 11.7* 11.3* 11.8*  HCT 37.5* 36.0* 35.3* 37.8*  MCV 86.6 85.9 86.3 86.9  PLT 269 293 291 149   Basic Metabolic Panel: Recent Labs  Lab  10/10/18 0355 10/11/18 0308 10/12/18 0352 10/14/18 0343 10/15/18 0402  NA 133* 132* 130* 134* 133*  K 4.8 4.5 4.6 4.4 4.6  CL 94* 98 97* 103 99  CO2 23 24 22  21* 23  GLUCOSE 121* 166* 139* 142* 98  BUN 68* 68* 62* 55* 53*  CREATININE 1.49* 1.55* 1.35* 1.29* 1.17  CALCIUM 9.6 9.2 9.6 9.5 9.9  MG  --   --  2.4 2.4 2.4   GFR: Estimated Creatinine Clearance: 72.1 mL/min (by C-G formula based on SCr of 1.17 mg/dL). Liver Function Tests: Recent Labs  Lab 10/12/18 0352  AST 20  ALT 9  ALKPHOS 45  BILITOT 0.7  PROT 6.3*  ALBUMIN 3.1*   No results for input(s): LIPASE, AMYLASE in the last 168 hours. No results for input(s): AMMONIA in the last 168 hours. Coagulation Profile: No results for input(s): INR, PROTIME in the last 168 hours. Cardiac Enzymes: No results for input(s): CKTOTAL, CKMB, CKMBINDEX, TROPONINI in the last 168 hours. BNP (last 3 results) No results for input(s): PROBNP in the last 8760 hours. HbA1C: No results for input(s): HGBA1C in the last 72 hours. CBG: Recent Labs  Lab 10/13/18 0757 10/14/18 0805  10/14/18 1147 10/14/18 1700 10/15/18 0746  GLUCAP 115* 92 135* 96 91   Lipid Profile: No results for input(s): CHOL, HDL, LDLCALC, TRIG, CHOLHDL, LDLDIRECT in the last 72 hours. Thyroid Function Tests: No results for input(s): TSH, T4TOTAL, FREET4, T3FREE, THYROIDAB in the last 72 hours. Anemia Panel: No results for input(s): VITAMINB12, FOLATE, FERRITIN, TIBC, IRON, RETICCTPCT in the last 72 hours. Sepsis Labs: No results for input(s): PROCALCITON, LATICACIDVEN in the last 168 hours.  No results found for this or any previous visit (from the past 240 hour(s)).       Radiology Studies: No results found.      Scheduled Meds: . cycloSPORINE  1 drop Both Eyes BID  . diltiazem  180 mg Oral Daily  . escitalopram  10 mg Oral Daily  . gabapentin  100 mg Oral TID  . lidocaine  1 patch Transdermal Q24H  . Melatonin  6 mg Oral QHS  . metroNIDAZOLE  500 mg Oral Q8H  . midodrine  10 mg Oral TID WC  . multivitamin with minerals  1 tablet Oral Daily  . pantoprazole  40 mg Oral Daily  . ENSURE MAX PROTEIN  11 oz Oral BID  . spironolactone  50 mg Oral Daily  . thiamine  100 mg Oral Daily   Continuous Infusions:   LOS: 11 days        Aline August, MD [facility] Pager [phone]  If 7PM-7AM, please contact night-coverage [email or web address] Password [facility] 10/15/2018, 8:57 AM

## 2018-10-15 NOTE — Progress Notes (Signed)
   10/15/18 1158  Clinical Encounter Type  Visited With Patient  Visit Type Initial;Other (Comment) (AD)  Referral From Palliative care team  Consult/Referral To Chaplain  The chaplain responded to spiritual care consult for AD.  The chaplain was pastorally present and completed the AD education leaving the document with the Pt.  The chaplain will make a request for the notary to complete the AD process.  The chaplain will F/U with spiritual care as needed.

## 2018-10-16 ENCOUNTER — Inpatient Hospital Stay (HOSPITAL_COMMUNITY): Payer: Medicare Other

## 2018-10-16 LAB — GLUCOSE, CAPILLARY
Glucose-Capillary: 108 mg/dL — ABNORMAL HIGH (ref 70–99)
Glucose-Capillary: 109 mg/dL — ABNORMAL HIGH (ref 70–99)

## 2018-10-16 MED ORDER — SODIUM CHLORIDE 0.9% FLUSH: 10.0000 mL | INTRAVENOUS | Status: DC | PRN

## 2018-10-16 MED ORDER — SODIUM CHLORIDE 0.9% FLUSH
10.0000 mL | INTRAVENOUS | Status: DC | PRN
  Administered 2018-10-20: 10 mL
  Administered 2018-10-21: 20 mL
  Administered 2018-10-28 – 2018-12-02 (×5): 10 mL
  Filled 2018-10-16 (×7): qty 40

## 2018-10-16 MED ORDER — OXYCODONE HCL 5 MG PO TABS: 5.0000 mg | ORAL_TABLET | Freq: Once | ORAL | Status: DC

## 2018-10-16 MED ORDER — TRAZODONE HCL 50 MG PO TABS
50.0000 mg | ORAL_TABLET | Freq: Once | ORAL | Status: AC
  Administered 2018-10-16: 50 mg via ORAL
  Filled 2018-10-16: qty 1

## 2018-10-16 MED ORDER — OXYCODONE HCL 5 MG PO TABS
5.0000 mg | ORAL_TABLET | Freq: Once | ORAL | Status: AC
  Administered 2018-10-16: 5 mg via ORAL
  Filled 2018-10-16: qty 1

## 2018-10-16 MED ORDER — APIXABAN 5 MG PO TABS
5.0000 mg | ORAL_TABLET | Freq: Two times a day (BID) | ORAL | Status: DC
  Administered 2018-10-16 – 2018-12-07 (×106): 5 mg via ORAL
  Filled 2018-10-16 (×107): qty 1

## 2018-10-16 MED ORDER — SENNOSIDES-DOCUSATE SODIUM 8.6-50 MG PO TABS
1.0000 | ORAL_TABLET | Freq: Two times a day (BID) | ORAL | Status: DC
  Administered 2018-10-16 – 2018-10-23 (×14): 1 via ORAL
  Filled 2018-10-16 (×14): qty 1

## 2018-10-16 NOTE — Progress Notes (Signed)
Patient requesting ICE packs. ICE applied to both knees. Pt yelling at nurse stating that no one listens to him and it is all the staffs fault that he fell. Patient states that "no one listens to him" He also States that sitting up in a chair for 30 minutes is to long for him. Demanding to speak with charge nurse. CN notified and will speak with patient.

## 2018-10-16 NOTE — Progress Notes (Signed)
Informed Dr. Starla Link that patient is requesting to speak with him and demanding to be transferred to [name], Pt also requesting more pain medication. Per MD give patient one time does of 5mg  oral oxycodone and he will speak to patient in the morning.

## 2018-10-16 NOTE — Progress Notes (Signed)
[place] for Apixaban Indication: atrial fibrillation  Allergies  Allergen Reactions  . Bee Venom Swelling  . Nickel Rash    Patient Measurements: Height: 5\' 8"  (172.7 cm) Weight: 264 lb 8.8 oz (120 kg) IBW/kg (Calculated) : 68.4  Vital Signs: Temp: 98.2 F (36.8 C) (02/15 0432) Temp Source: Oral (02/15 0432) BP: 100/75 (02/15 0432) Pulse Rate: 99 (02/15 0432)  Labs: Recent Labs    10/14/18 0343 10/15/18 0402  HGB 11.3* 11.8*  HCT 35.3* 37.8*  PLT 291 323  CREATININE 1.29* 1.17    Estimated Creatinine Clearance: 71.8 mL/min (by C-G formula based on SCr of 1.17 mg/dL).   Medical History: Past Medical History:  Diagnosis Date  . Anginal pain ([place]) 2006   evaluated by cardio  . Arthritis    knees,feet,shoulders,elbows.hands  . Constipation   . Dyspnea    with exertion   . Dysrhythmia    Atrial Flutter- 2006- corredted itself  . Family history of adverse reaction to anesthesia    mother- with novocaine went into shock  . GERD (gastroesophageal reflux disease)   . Headache   . Hemorrhoids   . History of blood transfusion   . Hypertension   . Insomnia   . Sleep apnea    cpap  . 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 cycloSPORINE  1 drop Both Eyes BID  . diltiazem  180 mg Oral Daily  . escitalopram  10 mg Oral Daily  . gabapentin  100 mg Oral TID  . lidocaine  1 patch Transdermal Q24H  . Melatonin  6 mg Oral QHS  . midodrine  10 mg Oral TID WC  . multivitamin with minerals  1 tablet Oral Daily  . pantoprazole  40 mg Oral Daily  . ENSURE MAX PROTEIN  11 oz Oral BID  . spironolactone  50 mg Oral Daily  . thiamine  100 mg Oral Daily    Assessment: 21 YOM on who presented on 1/31 with prosthetic hip infection. PMH includes nonvalvular afib on Eliquis PTA. Eliquis was held due to procedure during admission. Pharmacy has been consulted to restart Eliquis.  CBC is stable and  wnl. Scr is down to 1.17. No bleeding noted.  Goal of Therapy:  Therapeutic anticoagulation   Plan:  Resume Eliquis 5 mg PO BID Pharmacy will sign off since Eliquis dosing is not likely to change.  Jackson Latino, PharmD PGY1 Pharmacy Resident Phone [phone] 10/16/2018     10:54 AM

## 2018-10-16 NOTE — Progress Notes (Signed)
Both shift charge nurses spoke with the patient and his HCPOA.  He is requesting transfer to [facility] due to anger after assisted fall.  The charge nurses tried calming techniques of avid listening and empathy.  Patient has not been dissuaded from initial decision.  Primary MD will be made aware of patient request.

## 2018-10-16 NOTE — Progress Notes (Signed)
Peripherally Inserted Central Catheter/Midline Placement  The IV Nurse has discussed with the patient and/or persons authorized to consent for the patient, the purpose of this procedure and the potential benefits and risks involved with this procedure.  The benefits include less needle sticks, lab draws from the catheter, and the patient may be discharged home with the catheter. Risks include, but not limited to, infection, bleeding, blood clot (thrombus formation), and puncture of an artery; nerve damage and irregular heartbeat and possibility to perform a PICC exchange if needed/ordered by physician.  Alternatives to this procedure were also discussed.  Bard Power PICC patient education guide, fact sheet on infection prevention and patient information card has been provided to patient /or left at bedside.    PICC/Midline Placement Documentation  PICC Single Lumen 10/16/18 PICC Left Brachial 47 cm 0 cm (Active)  Indication for Insertion or Continuance of Line Home intravenous therapies (PICC only) 10/16/2018 12:00 PM  Exposed Catheter (cm) 0 cm 10/16/2018 12:00 PM  Site Assessment Clean;Dry;Intact 10/16/2018 12:00 PM  Line Status Flushed;Blood return noted;Saline locked 10/16/2018 12:00 PM  Dressing Type Transparent 10/16/2018 12:00 PM  Dressing Status Clean;Dry;Intact;Antimicrobial disc in place 10/16/2018 12:00 PM  Line Care Connections checked and tightened 10/16/2018 12:00 PM  Line Adjustment (NICU/IV Team Only) No 10/16/2018 12:00 PM  Dressing Intervention New dressing 10/16/2018 12:00 PM  Dressing Change Due 10/23/18 10/16/2018 12:00 PM       Rolena Infante 10/16/2018, 12:22 PM

## 2018-10-16 NOTE — Progress Notes (Signed)
Patient ID: [name], male   DOB: 22-Jul-1946, 73 y.o.   MRN: [id number]  PROGRESS NOTE    TIFFANY TALARICO  SFK:[id number] DOB: Jan 17, 1946 DOA: 10/01/2018 PCP: [name], MD   Brief Narrative:  73 year old male with history of A. 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  Found to have soft tissue infection of the left hip area, acute kidney injury and hyperkalemia. He was initially hydrated but then developed volume overload for which he was diuresed with albumin and Lasix.  ID and orthopedics were consulted for left hip infection.  He had IR guided left hip aspiration on 10/15/2018.  ID is recommending 6 weeks of IV Invanz.  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 Orthopedic evaluation appreciated.  Apparently patient is not a candidate for salvage procedures.  Orthopedics recommended IR guided aspiration and fluid analysis.  IR consult requested.  ID following.  Currently on Flagyl.  Most recent surgery in November 2019 at [facility] involving debridement but  retention of prosthesis yielded Bacteroides fragilis and cultures, status post Invanz and attempts at suppression with metronidazole now. -Status post IR guided aspiration on 10/15/2018 which had 20 white cells.  Cultures pending.  ID has started patient on Invanz and recommend 6 weeks of IV Invanz.  Flagyl has been discontinued. -Pending PICC line placement.  AKI due to ATN with hyperkalemia -Initially due to dehydration which improved after IV fluids.  Subsequently developed volume overload for which he was aggressively diuresed with IV albumin and Lasix.  Subsequently renal function became slightly worse hence Lasix was discontinued.  Currently on low-dose Aldactone.  Creatinine stable.  Will repeat a.m. labs.  Strict input and output.  Daily weights.  Continue fluid restriction.  Negative balance of 8780 cc since admission.  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  Continue spironolactone if blood pressure allows.  Lasix on hold for now.  Might need intermittent Lasix as well.  Respiratory status is stable; will hold off on thoracentesis at this time.  Chronic atrial fibrillation -Rate controlled.  Continue Cardizem and Eliquis.  Chronic back and right shoulder pain -Continue Lidoderm patch, Tylenol and as needed narcotics  Bipolar disorder -Stable.  Continue Lexapro and Neurontin  OSA -Continue CPAP nightly  Morbid obesity -Follow-up with PCP  Generalized deconditioning -Patient is very deconditioned.  PT is recommending SNF.  Social worker aware and following.   DVT prophylaxis: We will resume Eliquis code Status: DNR  family Communication: None at bedside Disposition Plan: SNF once bed is available  Consultants: Orthopedic/ID/IR  Procedures: IR guided left hip aspiration on 10/15/2018  Antimicrobials:  Anti-infectives (From admission, onward)   Start     Dose/Rate Route  Frequency Ordered Stop   10/15/18 1500  cefTRIAXone (ROCEPHIN) 2 g in sodium chloride 0.9 % 100 mL IVPB  Status:  Discontinued     2 g 200 mL/hr over 30 Minutes Intravenous Every 24 hours 10/15/18 1434 10/15/18 1447   10/15/18 1500  ertapenem (INVANZ) 1,000 mg in sodium chloride 0.9 % 100 mL IVPB     1 g 200 mL/hr over 30 Minutes Intravenous Every 24 hours 10/15/18 1447     10/08/18 0000  doxycycline (VIBRA-TABS) 100 MG tablet     100 mg Oral Every 12 hours 10/08/18 0921     10/06/18 1000  doxycycline (VIBRA-TABS) tablet 100 mg  Status:  Discontinued     100 mg Oral Every 12 hours 10/06/18 0726 10/11/18 1328   10/03/18 1800  metroNIDAZOLE (FLAGYL) tablet 500 mg  Status:  Discontinued     500 mg Oral Every 8 hours 10/03/18 1054 10/15/18 1447   10/03/18 0700  vancomycin (VANCOCIN) 2,000 mg in sodium chloride 0.9 % 500 mL IVPB  Status:  Discontinued     2,000 mg 250 mL/hr over 120 Minutes Intravenous Every 36 hours 10/01/18 1942 10/06/18 0726   10/02/18 0200  ceFEPIme (MAXIPIME) 2 g in sodium chloride 0.9 % 100 mL IVPB  Status:  Discontinued     2 g 200 mL/hr over 30 Minutes Intravenous Every 8 hours 10/01/18 1942 10/02/18 1008   10/01/18 1830  vancomycin (VANCOCIN) 2,500 mg in sodium chloride 0.9 % 500 mL IVPB     2,500 mg 250 mL/hr over 120 Minutes Intravenous STAT 10/01/18 1825 10/02/18 0126   10/01/18 1800  ceFEPIme (MAXIPIME) 2 g in sodium chloride 0.9 % 100 mL IVPB     2 g 200 mL/hr over 30 Minutes Intravenous  Once 10/01/18 1747 10/01/18 1903   10/01/18 1800  metroNIDAZOLE (FLAGYL) IVPB 500 mg  Status:  Discontinued     500 mg 100 mL/hr over 60 Minutes Intravenous Every 8 hours 10/01/18 1747 10/03/18 1054   10/01/18 1800  vancomycin (VANCOCIN) IVPB 1000 mg/200 mL premix  Status:  Discontinued     1,000 mg 200 mL/hr over 60 Minutes Intravenous  Once 10/01/18 1747 10/01/18 1825       Subjective: Patient seen and examined at bedside.  He is a poor historian.  He is still very  weak and as per the nursing staff, he hardly gets out of bed.  No overnight fever, nausea, vomiting or worsening left hip pain.  Objective: Vitals:   10/15/18 1547 10/15/18 2121 10/16/18 0432 10/16/18 0445  BP: 117/74 107/66 100/75   Pulse: 95 99 99   Resp: 16 18 16    Temp: (!) 97.5 F (36.4 C) 98.4 F (36.9 C) 98.2 F (36.8 C)   TempSrc: Oral Oral Oral   SpO2: 97% 95% 98%   Weight:    120 kg  Height:        Intake/Output Summary (Last 24 hours) at 10/16/2018 1020 Last data filed at 10/16/2018 0900 Gross per 24 hour  Intake 100 ml  Output 1525 ml  Net -1425 ml   Filed Weights   10/14/18 0500 10/15/18 0500 10/16/18 0445  Weight: 117.5 kg 120.6 kg 120 kg    Examination:  General exam: Appears older than stated age.  Lying in bed.  No acute distress Respiratory system: Bilateral  decreased breath sounds at bases with basilar crackles, no wheezing Cardiovascular system: S1-S2 heard, rate controlled Gastrointestinal system: Abdomen is distended, soft and nontender. Normal bowel sounds heard. Extremities: No cyanosis; lower extremity edema present.  Left hip erythema present  Data Reviewed: I have personally reviewed following labs and imaging studies  CBC: Recent Labs  Lab 10/11/18 0308 10/12/18 0352 10/14/18 0343 10/15/18 0402  WBC 7.5 8.4 8.0 8.5  NEUTROABS  --   --  5.1 5.7  HGB 11.8* 11.7* 11.3* 11.8*  HCT 37.5* 36.0* 35.3* 37.8*  MCV 86.6 85.9 86.3 86.9  PLT 269 293 291 376   Basic Metabolic Panel: Recent Labs  Lab 10/10/18 0355 10/11/18 0308 10/12/18 0352 10/14/18 0343 10/15/18 0402  NA 133* 132* 130* 134* 133*  K 4.8 4.5 4.6 4.4 4.6  CL 94* 98 97* 103 99  CO2 23 24 22  21* 23  GLUCOSE 121* 166* 139* 142* 98  BUN 68* 68* 62* 55* 53*  CREATININE 1.49* 1.55* 1.35* 1.29* 1.17  CALCIUM 9.6 9.2 9.6 9.5 9.9  MG  --   --  2.4 2.4 2.4   GFR: Estimated Creatinine Clearance: 71.8 mL/min (by C-G formula based on SCr of 1.17 mg/dL). Liver Function  Tests: Recent Labs  Lab 10/12/18 0352  AST 20  ALT 9  ALKPHOS 45  BILITOT 0.7  PROT 6.3*  ALBUMIN 3.1*   No results for input(s): LIPASE, AMYLASE in the last 168 hours. No results for input(s): AMMONIA in the last 168 hours. Coagulation Profile: No results for input(s): INR, PROTIME in the last 168 hours. Cardiac Enzymes: No results for input(s): CKTOTAL, CKMB, CKMBINDEX, TROPONINI in the last 168 hours. BNP (last 3 results) No results for input(s): PROBNP in the last 8760 hours. HbA1C: No results for input(s): HGBA1C in the last 72 hours. CBG: Recent Labs  Lab 10/14/18 0805 10/14/18 1147 10/14/18 1700 10/15/18 0746 10/16/18 0811  GLUCAP 92 135* 96 91 108*   Lipid Profile: No results for input(s): CHOL, HDL, LDLCALC, TRIG, CHOLHDL, LDLDIRECT in the last 72 hours. Thyroid Function Tests: No results for input(s): TSH, T4TOTAL, FREET4, T3FREE, THYROIDAB in the last 72 hours. Anemia Panel: No results for input(s): VITAMINB12, FOLATE, FERRITIN, TIBC, IRON, RETICCTPCT in the last 72 hours. Sepsis Labs: No results for input(s): PROCALCITON, LATICACIDVEN in the last 168 hours.  Recent Results (from the past 240 hour(s))  Body fluid culture     Status: None (Preliminary result)   Collection Time: 10/15/18 10:22 AM  Result Value Ref Range Status   Specimen Description SYNOVIAL LEFT HIP  Final   Special Requests NONE  Final   Gram Stain NO WBC SEEN NO ORGANISMS SEEN   Final   Culture   Final    NO GROWTH < 24 HOURS Performed at [facility], [address], [place], [place] [address]    Report Status PENDING  Incomplete         Radiology Studies: Dg Fluoro Guided Needle Plc Aspiration/injection Loc  Result Date: 10/15/2018 CLINICAL DATA:  Left hip pain, evaluate for fluid collection EXAM: LEFT HIP ASPIRATION UNDER FLUOROSCOPY FLUOROSCOPY TIME:  Fluoroscopy Time:  18 seconds Radiation Exposure Index (if provided by the fluoroscopic device): 2.3 mGy Number of  Acquired Spot Images: 1 PROCEDURE: Overlying skin prepped with Betadine, draped in the usual sterile fashion, and infiltrated locally with buffered Lidocaine. 20 gauge spinal needle advanced to the prosthetic junction of the femoral head/neck. 1 ml of Lidocaine injected easily. No fluid/frank  infection could be aspirated. 4 mL Lidocaine and 6 mL sterile saline administered into the joint with attempted subsequent aspiration. Less than 1 mL fluid was aspirated and sent for laboratory evaluation. No immediate complication. IMPRESSION: Technically successful left hip aspiration under fluoroscopy, as described above. Electronically Signed   By: Julian Hy M.D.   On: 10/15/2018 11:18   [place] Ekg Site Rite  Result Date: 10/15/2018 If Site Rite image not attached, placement could not be confirmed due to current cardiac rhythm.       Scheduled Meds: . cycloSPORINE  1 drop Both Eyes BID  . diltiazem  180 mg Oral Daily  . escitalopram  10 mg Oral Daily  . gabapentin  100 mg Oral TID  . lidocaine  1 patch Transdermal Q24H  . Melatonin  6 mg Oral QHS  . midodrine  10 mg Oral TID WC  . multivitamin with minerals  1 tablet Oral Daily  . pantoprazole  40 mg Oral Daily  . ENSURE MAX PROTEIN  11 oz Oral BID  . spironolactone  50 mg Oral Daily  . thiamine  100 mg Oral Daily   Continuous Infusions: . ertapenem 1,000 mg (10/15/18 1838)     LOS: 12 days        Aline August, MD [facility] Pager [phone]  If 7PM-7AM, please contact night-coverage [email or web address] Password [email or web address] 10/16/2018, 10:20 AM

## 2018-10-16 NOTE — Progress Notes (Signed)
Informed Dr. Starla Link of fall. Pain medication ordered and Xrays ordered.

## 2018-10-16 NOTE — Progress Notes (Signed)
Pt complaining of swelling and warmth in her right upper arm. Upon assessment patient's upper right arm is no different in size than his left upper arm, but his right arm is significantly warmer than his left. [place] notified. No new orders at this time.

## 2018-10-16 NOTE — Progress Notes (Signed)
This RN entered pt's room to find him in the chair and pt constantly complaining of pain to his buttocks and the need to get out of the chair. 2 staff members attempted to return pt to the bed using a gait belt and a walker. While transferring pt stating "I'm falling backwards". Pt given multiple verbal cues to stand up straight and the pt buckled his knees and was assisted to the floor. Pt didn't hit head or have any LOC. Pt was assisted back to bed from floor with a maxi-move and 3 staff members. Pt now complaining of pain to his knees. MD notified and orders received. Primary RN given full report of incident. Blood pressure 117/75, pulse (!) 102, temperature 98.4 F (36.9 C), temperature source Oral, resp. rate (!) 21, height 5\' 8"  (1.727 m), weight 120 kg, SpO2 99 %.

## 2018-10-17 ENCOUNTER — Inpatient Hospital Stay (HOSPITAL_COMMUNITY): Payer: Medicare Other

## 2018-10-17 LAB — CBC WITH DIFFERENTIAL/PLATELET
Abs Immature Granulocytes: 0.04 10*3/uL (ref 0.00–0.07)
Basophils Absolute: 0 10*3/uL (ref 0.0–0.1)
Basophils Relative: 0 %
Eosinophils Absolute: 0.1 10*3/uL (ref 0.0–0.5)
Eosinophils Relative: 1 %
HCT: 35.7 % — ABNORMAL LOW (ref 39.0–52.0)
Hemoglobin: 11.4 g/dL — ABNORMAL LOW (ref 13.0–17.0)
Immature Granulocytes: 1 %
Lymphocytes Relative: 12 %
Lymphs Abs: 1.1 10*3/uL (ref 0.7–4.0)
MCH: 28.1 pg (ref 26.0–34.0)
MCHC: 31.9 g/dL (ref 30.0–36.0)
MCV: 88.1 fL (ref 80.0–100.0)
Monocytes Absolute: 0.9 10*3/uL (ref 0.1–1.0)
Monocytes Relative: 10 %
Neutro Abs: 6.7 10*3/uL (ref 1.7–7.7)
Neutrophils Relative %: 76 %
Platelets: 313 10*3/uL (ref 150–400)
RBC: 4.05 MIL/uL — ABNORMAL LOW (ref 4.22–5.81)
RDW: 18.1 % — ABNORMAL HIGH (ref 11.5–15.5)
WBC: 8.8 10*3/uL (ref 4.0–10.5)
nRBC: 0 % (ref 0.0–0.2)

## 2018-10-17 LAB — BASIC METABOLIC PANEL
Anion gap: 8 (ref 5–15)
BUN: 41 mg/dL — ABNORMAL HIGH (ref 8–23)
CO2: 23 mmol/L (ref 22–32)
Calcium: 10.1 mg/dL (ref 8.9–10.3)
Chloride: 103 mmol/L (ref 98–111)
Creatinine, Ser: 0.94 mg/dL (ref 0.61–1.24)
GFR calc Af Amer: >60 mL/min (ref >60)
GFR calc non Af Amer: >60 mL/min (ref >60)
Glucose, Bld: 115 mg/dL — ABNORMAL HIGH (ref 70–99)
Potassium: 4.6 mmol/L (ref 3.5–5.1)
Sodium: 134 mmol/L — ABNORMAL LOW (ref 135–145)

## 2018-10-17 LAB — ACID FAST CULTURE WITH REFLEXED SENSITIVITIES (MYCOBACTERIA): Acid Fast Culture: NEGATIVE

## 2018-10-17 LAB — GLUCOSE, CAPILLARY: Glucose-Capillary: 98 mg/dL (ref 70–99)

## 2018-10-17 LAB — MAGNESIUM: Magnesium: 2.1 mg/dL (ref 1.7–2.4)

## 2018-10-17 MED ORDER — OXYCODONE HCL 5 MG PO TABS
5.0000 mg | ORAL_TABLET | ORAL | Status: DC | PRN
  Administered 2018-10-17: 5 mg via ORAL
  Administered 2018-10-17 – 2018-10-26 (×21): 10 mg via ORAL
  Administered 2018-10-26 – 2018-10-27 (×3): 5 mg via ORAL
  Administered 2018-10-27: 10 mg via ORAL
  Administered 2018-10-28: 5 mg via ORAL
  Administered 2018-10-28: 10 mg via ORAL
  Administered 2018-10-28: 5 mg via ORAL
  Administered 2018-10-29 – 2018-11-07 (×18): 10 mg via ORAL
  Filled 2018-10-17: qty 2
  Filled 2018-10-17: qty 1
  Filled 2018-10-17 (×4): qty 2
  Filled 2018-10-17: qty 1
  Filled 2018-10-17 (×2): qty 2
  Filled 2018-10-17: qty 1
  Filled 2018-10-17 (×13): qty 2
  Filled 2018-10-17: qty 1
  Filled 2018-10-17 (×14): qty 2
  Filled 2018-10-17: qty 1
  Filled 2018-10-17: qty 2
  Filled 2018-10-17: qty 1
  Filled 2018-10-17 (×3): qty 2
  Filled 2018-10-17: qty 1
  Filled 2018-10-17 (×5): qty 2

## 2018-10-17 NOTE — Progress Notes (Signed)
Patient ID: [name], male   DOB: Apr 20, 1946, 73 y.o.   MRN: [id number]  PROGRESS NOTE    [name]  SWN:[id number] DOB: 1946/03/03 DOA: 10/01/2018 PCP: [name], MD   Brief Narrative:  73 year old male with history of A. 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  Found to have soft tissue infection of the left hip area, acute kidney injury and hyperkalemia. He was initially hydrated but then developed volume overload for which he was diuresed with albumin and Lasix.  ID and orthopedics were consulted for left hip infection.  He had IR guided left hip aspiration on 10/15/2018.  ID is recommending 6 weeks of IV Invanz.  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 Orthopedic evaluation appreciated.  Apparently patient is not a candidate for salvage procedures.  Orthopedics recommended IR guided aspiration and fluid analysis.  IR consult requested.  ID following.  Currently on Flagyl.  Most recent surgery in November 2019 at [facility] involving debridement but  retention of prosthesis yielded Bacteroides fragilis and cultures, status post Invanz and attempts at suppression with metronidazole now. -Status post IR guided aspiration on 10/15/2018 which had 20 white cells.  Cultures pending.  ID has started patient on Invanz and recommend 6 weeks of IV Invanz.  Flagyl has been discontinued. -Status post PICC line placement on 10/16/2018.  Fall and left knee pain -Patient apparently fell yesterday and since then has been complaining of left knee pain. -No fracture seen on x-ray.  Fall precautions.  Pain management.  AKI due to ATN with hyperkalemia -Initially due to dehydration which improved after IV fluids.  Subsequently developed volume overload for which he was aggressively diuresed with IV albumin and Lasix.  Subsequently renal function became slightly worse hence Lasix was discontinued.  Currently on low-dose Aldactone.  Creatinine stable.  Will repeat a.m. labs.  Strict input and output.  Daily weights.  Continue fluid restriction.  Negative balance of 9835 cc since admission.  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  Continue spironolactone if blood pressure allows.  Lasix on hold for now.  Might need intermittent Lasix as well.  Respiratory status is stable; will hold off on thoracentesis at this time.  Chronic atrial fibrillation -Rate controlled.  Continue Cardizem and Eliquis.  Chronic back and right shoulder pain -Continue Lidoderm patch, Tylenol and as needed narcotics  Bipolar disorder -Stable.  Continue Lexapro and Neurontin  OSA -Continue CPAP nightly  Morbid obesity -Follow-up with PCP  Generalized deconditioning -Patient is very deconditioned.  PT is recommending SNF.  Social worker aware and following.   DVT prophylaxis: We will resume Eliquis code Status: DNR  family Communication: None at bedside Disposition Plan: SNF  once bed is available Consultants: Orthopedic/ID/IR  Procedures: IR guided left hip aspiration on 10/15/2018  Antimicrobials:  Anti-infectives (From admission, onward)   Start     Dose/Rate Route Frequency Ordered Stop   10/15/18 1500  cefTRIAXone (ROCEPHIN) 2 g in sodium chloride 0.9 % 100 mL IVPB  Status:  Discontinued     2 g 200 mL/hr over 30 Minutes Intravenous Every 24 hours 10/15/18 1434 10/15/18 1447   10/15/18 1500  ertapenem (INVANZ) 1,000 mg in sodium chloride 0.9 % 100 mL IVPB     1 g 200 mL/hr over 30 Minutes Intravenous Every 24 hours 10/15/18 1447     10/08/18 0000  doxycycline (VIBRA-TABS) 100 MG tablet     100 mg Oral Every 12 hours 10/08/18 0921     10/06/18 1000  doxycycline (VIBRA-TABS) tablet 100 mg  Status:  Discontinued     100 mg Oral Every 12 hours 10/06/18 0726 10/11/18 1328   10/03/18 1800  metroNIDAZOLE (FLAGYL) tablet 500 mg  Status:  Discontinued     500 mg Oral Every 8 hours 10/03/18 1054 10/15/18 1447   10/03/18 0700  vancomycin (VANCOCIN) 2,000 mg in sodium chloride 0.9 % 500 mL IVPB  Status:  Discontinued     2,000 mg 250 mL/hr over 120 Minutes Intravenous Every 36 hours 10/01/18 1942 10/06/18 0726   10/02/18 0200  ceFEPIme (MAXIPIME) 2 g in sodium chloride 0.9 % 100 mL IVPB  Status:  Discontinued     2 g 200 mL/hr over 30 Minutes Intravenous Every 8 hours 10/01/18 1942 10/02/18 1008   10/01/18 1830  vancomycin (VANCOCIN) 2,500 mg in sodium chloride 0.9 % 500 mL IVPB     2,500 mg 250 mL/hr over 120 Minutes Intravenous STAT 10/01/18 1825 10/02/18 0126   10/01/18 1800  ceFEPIme (MAXIPIME) 2 g in sodium chloride 0.9 % 100 mL IVPB     2 g 200 mL/hr over 30 Minutes Intravenous  Once 10/01/18 1747 10/01/18 1903   10/01/18 1800  metroNIDAZOLE (FLAGYL) IVPB 500 mg  Status:  Discontinued     500 mg 100 mL/hr over 60 Minutes Intravenous Every 8 hours 10/01/18 1747 10/03/18 1054   10/01/18 1800  vancomycin (VANCOCIN) IVPB 1000 mg/200 mL premix  Status:   Discontinued     1,000 mg 200 mL/hr over 60 Minutes Intravenous  Once 10/01/18 1747 10/01/18 1825        Subjective: Patient seen and examined at bedside.  He is a poor historian.  He complains of left knee pain after the fall yesterday.  There is no loss of consciousness or trauma to head as per the nursing staff.  No overnight fever, nausea or vomiting.  Objective: Vitals:   10/16/18 1354 10/16/18 1742 10/16/18 2127 10/17/18 0530  BP: 105/75 117/75 112/72 109/71  Pulse: 89 (!) 102 (!) 117 (!) 107  Resp: (!) 21  16 18   Temp: 98.4 F (36.9 C) 98.6 F (37 C) 98.3 F (36.8 C) 97.6 F (36.4 C)  TempSrc: Oral Oral Oral Oral  SpO2: 98% 99% 95% 97%  Weight:      Height:        Intake/Output Summary (Last 24 hours) at 10/17/2018 0955 Last data filed at 10/17/2018 0900 Gross per 24 hour  Intake -  Output 790 ml  Net -790  ml   Filed Weights   10/14/18 0500 10/15/18 0500 10/16/18 0445  Weight: 117.5 kg 120.6 kg 120 kg    Examination:  General exam: Appears older than stated age.  Lying in bed.  No distress.  Looks chronically ill  Respiratory system: Bilateral decreased breath sounds at bases with basilar crackles Cardiovascular system:  rate controlled, S1-S2 heard Gastrointestinal system: Abdomen is distended, soft and nontender. Normal bowel sounds heard. Extremities: No cyanosis; lower extremity edema present.  Left hip erythema present. Left knee not obviously swollen or erythematous.  Data Reviewed: I have personally reviewed following labs and imaging studies  CBC: Recent Labs  Lab 10/11/18 0308 10/12/18 0352 10/14/18 0343 10/15/18 0402 10/17/18 0341  WBC 7.5 8.4 8.0 8.5 8.8  NEUTROABS  --   --  5.1 5.7 6.7  HGB 11.8* 11.7* 11.3* 11.8* 11.4*  HCT 37.5* 36.0* 35.3* 37.8* 35.7*  MCV 86.6 85.9 86.3 86.9 88.1  PLT 269 293 291 323 941   Basic Metabolic Panel: Recent Labs  Lab 10/11/18 0308 10/12/18 0352 10/14/18 0343 10/15/18 0402 10/17/18 0341  NA 132*  130* 134* 133* 134*  K 4.5 4.6 4.4 4.6 4.6  CL 98 97* 103 99 103  CO2 24 22 21* 23 23  GLUCOSE 166* 139* 142* 98 115*  BUN 68* 62* 55* 53* 41*  CREATININE 1.55* 1.35* 1.29* 1.17 0.94  CALCIUM 9.2 9.6 9.5 9.9 10.1  MG  --  2.4 2.4 2.4 2.1   GFR: Estimated Creatinine Clearance: 89.4 mL/min (by C-G formula based on SCr of 0.94 mg/dL). Liver Function Tests: Recent Labs  Lab 10/12/18 0352  AST 20  ALT 9  ALKPHOS 45  BILITOT 0.7  PROT 6.3*  ALBUMIN 3.1*   No results for input(s): LIPASE, AMYLASE in the last 168 hours. No results for input(s): AMMONIA in the last 168 hours. Coagulation Profile: No results for input(s): INR, PROTIME in the last 168 hours. Cardiac Enzymes: No results for input(s): CKTOTAL, CKMB, CKMBINDEX, TROPONINI in the last 168 hours. BNP (last 3 results) No results for input(s): PROBNP in the last 8760 hours. HbA1C: No results for input(s): HGBA1C in the last 72 hours. CBG: Recent Labs  Lab 10/14/18 1700 10/15/18 0746 10/16/18 0811 10/16/18 1726 10/17/18 0812  GLUCAP 96 91 108* 109* 98   Lipid Profile: No results for input(s): CHOL, HDL, LDLCALC, TRIG, CHOLHDL, LDLDIRECT in the last 72 hours. Thyroid Function Tests: No results for input(s): TSH, T4TOTAL, FREET4, T3FREE, THYROIDAB in the last 72 hours. Anemia Panel: No results for input(s): VITAMINB12, FOLATE, FERRITIN, TIBC, IRON, RETICCTPCT in the last 72 hours. Sepsis Labs: No results for input(s): PROCALCITON, LATICACIDVEN in the last 168 hours.  Recent Results (from the past 240 hour(s))  Body fluid culture     Status: None (Preliminary result)   Collection Time: 10/15/18 10:22 AM  Result Value Ref Range Status   Specimen Description SYNOVIAL LEFT HIP  Final   Special Requests NONE  Final   Gram Stain NO WBC SEEN NO ORGANISMS SEEN   Final   Culture   Final    NO GROWTH 2 DAYS Performed at [facility], [address] [place], [place] [address]    Report Status PENDING   Incomplete         Radiology Studies: Dg Knee 1-2 Views Left  Result Date: 10/16/2018 CLINICAL DATA:  Pain after fall EXAM: LEFT KNEE - 1-2 VIEW COMPARISON:  None. FINDINGS: No acute fracture or dislocation. No joint  effusion. Moderate to severe degenerative changes. IMPRESSION: Degenerative changes.  No acute fracture noted. Electronically Signed   By: Dorise Bullion III M.D   On: 10/16/2018 20:32   Dg Knee 1-2 Views Right  Result Date: 10/16/2018 CLINICAL DATA:  Pain after fall EXAM: RIGHT KNEE - 1-2 VIEW COMPARISON:  None. FINDINGS: No acute fracture or effusion. Moderate to severe degenerative changes. IMPRESSION: No acute abnormality. Electronically Signed   By: Dorise Bullion III M.D   On: 10/16/2018 20:32   Dg Chest Port 1 View  Result Date: 10/16/2018 CLINICAL DATA:  PICC line placement. EXAM: PORTABLE CHEST 1 VIEW COMPARISON:  Radiographs and CT 10/07/2018. FINDINGS: 1223 hours. Left arm PICC tip is not optimally visualized, although appears to extend to the lower SVC level. The heart size and mediastinal contours are stable. There is aortic atherosclerosis. Bilateral pleural thickening and subpleural rounded atelectasis peripherally in the left lung are unchanged. There is no pneumothorax. Glenohumeral degenerative changes are present bilaterally. IMPRESSION: PICC line tip at the lower SVC level. No pneumothorax or acute findings. Electronically Signed   By: Richardean Sale M.D.   On: 10/16/2018 13:03   Dg Fluoro Guided Needle Plc Aspiration/injection Loc  Result Date: 10/15/2018 CLINICAL DATA:  Left hip pain, evaluate for fluid collection EXAM: LEFT HIP ASPIRATION UNDER FLUOROSCOPY FLUOROSCOPY TIME:  Fluoroscopy Time:  18 seconds Radiation Exposure Index (if provided by the fluoroscopic device): 2.3 mGy Number of Acquired Spot Images: 1 PROCEDURE: Overlying skin prepped with Betadine, draped in the usual sterile fashion, and infiltrated locally with buffered Lidocaine. 20 gauge  spinal needle advanced to the prosthetic junction of the femoral head/neck. 1 ml of Lidocaine injected easily. No fluid/frank infection could be aspirated. 4 mL Lidocaine and 6 mL sterile saline administered into the joint with attempted subsequent aspiration. Less than 1 mL fluid was aspirated and sent for laboratory evaluation. No immediate complication. IMPRESSION: Technically successful left hip aspiration under fluoroscopy, as described above. Electronically Signed   By: Julian Hy M.D.   On: 10/15/2018 11:18   Dg Hips Bilat With Pelvis 2v  Result Date: 10/16/2018 CLINICAL DATA:  Fall.  Pain. EXAM: DG HIP (WITH OR WITHOUT PELVIS) 2V BILAT COMPARISON:  None. FINDINGS: The patient is status post bilateral hip replacements. Visualized hardware is in good position. No acute fractures. IMPRESSION: Bilateral hip replacements.  No acute fractures. Electronically Signed   By: Dorise Bullion III M.D   On: 10/16/2018 20:30   [place] Ekg Site Rite  Result Date: 10/15/2018 If Site Rite image not attached, placement could not be confirmed due to current cardiac rhythm.       Scheduled Meds: . apixaban  5 mg Oral BID  . cycloSPORINE  1 drop Both Eyes BID  . diltiazem  180 mg Oral Daily  . escitalopram  10 mg Oral Daily  . gabapentin  100 mg Oral TID  . lidocaine  1 patch Transdermal Q24H  . Melatonin  6 mg Oral QHS  . midodrine  10 mg Oral TID WC  . multivitamin with minerals  1 tablet Oral Daily  . pantoprazole  40 mg Oral Daily  . ENSURE MAX PROTEIN  11 oz Oral BID  . senna-docusate  1 tablet Oral BID  . spironolactone  50 mg Oral Daily  . thiamine  100 mg Oral Daily   Continuous Infusions: . ertapenem Stopped (10/17/18 0151)     LOS: 13 days        Aline August, MD [facility] Pager [phone]  If 7PM-7AM, please contact night-coverage [email or web address] Password [email or web address] 10/17/2018, 9:55 AM

## 2018-10-18 LAB — CBC WITH DIFFERENTIAL/PLATELET
Abs Immature Granulocytes: 0.01 10*3/uL (ref 0.00–0.07)
Basophils Absolute: 0 10*3/uL (ref 0.0–0.1)
Basophils Relative: 0 %
Eosinophils Absolute: 0.2 10*3/uL (ref 0.0–0.5)
Eosinophils Relative: 3 %
HCT: 34.3 % — ABNORMAL LOW (ref 39.0–52.0)
Hemoglobin: 10.9 g/dL — ABNORMAL LOW (ref 13.0–17.0)
Immature Granulocytes: 0 %
Lymphocytes Relative: 15 %
Lymphs Abs: 1.3 10*3/uL (ref 0.7–4.0)
MCH: 28.3 pg (ref 26.0–34.0)
MCHC: 31.8 g/dL (ref 30.0–36.0)
MCV: 89.1 fL (ref 80.0–100.0)
Monocytes Absolute: 1.1 10*3/uL — ABNORMAL HIGH (ref 0.1–1.0)
Monocytes Relative: 13 %
Neutro Abs: 5.6 10*3/uL (ref 1.7–7.7)
Neutrophils Relative %: 69 %
Platelets: 295 10*3/uL (ref 150–400)
RBC: 3.85 MIL/uL — ABNORMAL LOW (ref 4.22–5.81)
RDW: 18.5 % — ABNORMAL HIGH (ref 11.5–15.5)
WBC: 8.2 10*3/uL (ref 4.0–10.5)
nRBC: 0 % (ref 0.0–0.2)

## 2018-10-18 LAB — COMPREHENSIVE METABOLIC PANEL
ALT: 12 U/L (ref 0–44)
AST: 17 U/L (ref 15–41)
Albumin: 2.6 g/dL — ABNORMAL LOW (ref 3.5–5.0)
Alkaline Phosphatase: 45 U/L (ref 38–126)
Anion gap: 8 (ref 5–15)
BUN: 38 mg/dL — ABNORMAL HIGH (ref 8–23)
CO2: 24 mmol/L (ref 22–32)
Calcium: 9.8 mg/dL (ref 8.9–10.3)
Chloride: 102 mmol/L (ref 98–111)
Creatinine, Ser: 0.97 mg/dL (ref 0.61–1.24)
GFR calc Af Amer: >60 mL/min (ref >60)
GFR calc non Af Amer: >60 mL/min (ref >60)
Glucose, Bld: 102 mg/dL — ABNORMAL HIGH (ref 70–99)
Potassium: 4.8 mmol/L (ref 3.5–5.1)
Sodium: 134 mmol/L — ABNORMAL LOW (ref 135–145)
Total Bilirubin: 0.6 mg/dL (ref 0.3–1.2)
Total Protein: 6.3 g/dL — ABNORMAL LOW (ref 6.5–8.1)

## 2018-10-18 LAB — BODY FLUID CULTURE
Culture: NO GROWTH
Gram Stain: NONE SEEN

## 2018-10-18 LAB — MAGNESIUM: Magnesium: 2.1 mg/dL (ref 1.7–2.4)

## 2018-10-18 LAB — GLUCOSE, CAPILLARY: Glucose-Capillary: 145 mg/dL — ABNORMAL HIGH (ref 70–99)

## 2018-10-18 MED ORDER — SODIUM CHLORIDE 0.9 % IV SOLN
INTRAVENOUS | Status: DC | PRN
  Administered 2018-10-19 – 2018-10-24 (×5): 250 mL via INTRAVENOUS
  Administered 2018-11-14 – 2018-11-18 (×2): 1000 mL via INTRAVENOUS

## 2018-10-18 NOTE — Progress Notes (Signed)
Patient ID: [name], male   DOB: 22-Mar-1946, 73 y.o.   MRN: [id number]  PROGRESS NOTE    [name]  RWE:[id number] DOB: Jan 04, 1946 DOA: 10/01/2018 PCP: [name], MD   Brief Narrative:  73 year old male with history of A. 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  Found to have soft tissue infection of the left hip area, acute kidney injury and hyperkalemia. He was initially hydrated but then developed volume overload for which he was diuresed with albumin and Lasix.  ID and orthopedics were consulted for left hip infection.  He had IR guided left hip aspiration on 10/15/2018.  ID is recommending 6 weeks of IV Invanz.  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 Orthopedic evaluated the patient and apparently patient is not a candidate for salvage procedures.  Orthopedics recommended IR guided aspiration and fluid analysis.  Most recent surgery in November 2019 at [facility] involving debridement but retention of prosthesis yielded Bacteroides fragilis and  cultures, status post Invanz and attempts at suppression with metronidazole now. -Status post IR guided aspiration on 10/15/2018 which had 20 white cells.  Cultures negative so far.  ID has started patient on Invanz and recommend 6 weeks of IV Invanz.  Flagyl has been discontinued. -Status post PICC line placement on 10/16/2018.  Fall and left knee pain -Patient apparently fell on 10/16/2018 and since then has been complaining of left knee pain. -No fracture seen on x-ray.  Fall precautions.  Pain management.  AKI due to ATN with hyperkalemia -Initially due to dehydration which improved after IV fluids.  Subsequently developed volume overload for which he was aggressively diuresed with IV albumin and Lasix.  Subsequently renal function became slightly worse hence Lasix was discontinued.  Currently on low-dose Aldactone.  Creatinine stable.  Will repeat a.m. labs.  Strict input and output.  Daily weights.  Continue fluid restriction.  Negative balance of 10,165 cc since admission.  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  Continue spironolactone if blood pressure allows.  Lasix on hold for now.  Might need intermittent Lasix as well.  Respiratory status is stable; will hold off on thoracentesis at this time.  Chronic atrial fibrillation -Rate controlled.  Continue Cardizem and Eliquis.  Chronic back and right shoulder pain -Continue Lidoderm patch, Tylenol and as needed narcotics  Bipolar disorder -Stable.  Continue Lexapro and Neurontin  OSA -Continue CPAP nightly  Morbid obesity -Follow-up with PCP  Generalized deconditioning -Patient is very deconditioned.  PT is recommending SNF.  Social worker  aware and following.   DVT prophylaxis:  Eliquis code Status: DNR  family Communication: None at bedside Disposition Plan: SNF once bed is available Consultants:  Orthopedic/ID/IR  Procedures: IR guided left hip aspiration on 10/15/2018  Antimicrobials:  Anti-infectives (From admission, onward)   Start     Dose/Rate Route Frequency Ordered Stop   10/15/18 1500  cefTRIAXone (ROCEPHIN) 2 g in sodium chloride 0.9 % 100 mL IVPB  Status:  Discontinued     2 g 200 mL/hr over 30 Minutes Intravenous Every 24 hours 10/15/18 1434 10/15/18 1447   10/15/18 1500  ertapenem (INVANZ) 1,000 mg in sodium chloride 0.9 % 100 mL IVPB     1 g 200 mL/hr over 30 Minutes Intravenous Every 24 hours 10/15/18 1447     10/08/18 0000  doxycycline (VIBRA-TABS) 100 MG tablet     100 mg Oral Every 12 hours 10/08/18 0921     10/06/18 1000  doxycycline (VIBRA-TABS) tablet 100 mg  Status:  Discontinued     100 mg Oral Every 12 hours 10/06/18 0726 10/11/18 1328   10/03/18 1800  metroNIDAZOLE (FLAGYL) tablet 500 mg  Status:  Discontinued     500 mg Oral Every 8 hours 10/03/18 1054 10/15/18 1447   10/03/18 0700  vancomycin (VANCOCIN) 2,000 mg in sodium chloride 0.9 % 500 mL IVPB  Status:  Discontinued     2,000 mg 250 mL/hr over 120 Minutes Intravenous Every 36 hours 10/01/18 1942 10/06/18 0726   10/02/18 0200  ceFEPIme (MAXIPIME) 2 g in sodium chloride 0.9 % 100 mL IVPB  Status:  Discontinued     2 g 200 mL/hr over 30 Minutes Intravenous Every 8 hours 10/01/18 1942 10/02/18 1008   10/01/18 1830  vancomycin (VANCOCIN) 2,500 mg in sodium chloride 0.9 % 500 mL IVPB     2,500 mg 250 mL/hr over 120 Minutes Intravenous STAT 10/01/18 1825 10/02/18 0126   10/01/18 1800  ceFEPIme (MAXIPIME) 2 g in sodium chloride 0.9 % 100 mL IVPB     2 g 200 mL/hr over 30 Minutes Intravenous  Once 10/01/18 1747 10/01/18 1903   10/01/18 1800  metroNIDAZOLE (FLAGYL) IVPB 500 mg  Status:  Discontinued     500 mg 100 mL/hr over 60 Minutes Intravenous Every 8 hours 10/01/18 1747 10/03/18 1054   10/01/18 1800  vancomycin (VANCOCIN) IVPB 1000 mg/200 mL premix  Status:  Discontinued     1,000 mg 200 mL/hr over  60 Minutes Intravenous  Once 10/01/18 1747 10/01/18 1825         Subjective: Patient seen and examined at bedside.  He is a poor historian.  He denies worsening abdominal pain, shortness of breath, fever, nausea or vomiting.   Objective: Vitals:   10/17/18 1400 10/17/18 1628 10/17/18 2025 10/18/18 0525  BP:  101/69 (!) 103/58 92/70  Pulse:  (!) 105 (!) 106 (!) 114  Resp:  16 16 16   Temp:  98.9 F (37.2 C) 98.7 F (37.1 C) 99 F (37.2 C)  TempSrc:  Oral Oral Oral  SpO2:  94% 90% 92%  Weight: 120.5 kg     Height:        Intake/Output Summary (Last 24 hours) at 10/18/2018 0828 Last data filed at 10/18/2018 0136 Gross per 24 hour  Intake 260 ml  Output 920 ml  Net -660 ml   Filed Weights   10/15/18 0500 10/16/18 0445 10/17/18 1400  Weight: 120.6 kg 120 kg 120.5 kg    Examination:  General exam: Appears  older than stated age.  Lying in bed.  No acute distress.  Looks chronically ill  respiratory system: Bilateral decreased breath sounds at bases with basilar crackles, no wheezing Cardiovascular system: S1-S2 heard, rate controlled Gastrointestinal system: Abdomen is distended, soft and nontender. Normal bowel sounds heard. Extremities: No cyanosis; lower extremity edema present.  Data Reviewed: I have personally reviewed following labs and imaging studies  CBC: Recent Labs  Lab 10/12/18 0352 10/14/18 0343 10/15/18 0402 10/17/18 0341 10/18/18 0402  WBC 8.4 8.0 8.5 8.8 8.2  NEUTROABS  --  5.1 5.7 6.7 5.6  HGB 11.7* 11.3* 11.8* 11.4* 10.9*  HCT 36.0* 35.3* 37.8* 35.7* 34.3*  MCV 85.9 86.3 86.9 88.1 89.1  PLT 293 291 323 313 673   Basic Metabolic Panel: Recent Labs  Lab 10/12/18 0352 10/14/18 0343 10/15/18 0402 10/17/18 0341 10/18/18 0402  NA 130* 134* 133* 134* 134*  K 4.6 4.4 4.6 4.6 4.8  CL 97* 103 99 103 102  CO2 22 21* 23 23 24   GLUCOSE 139* 142* 98 115* 102*  BUN 62* 55* 53* 41* 38*  CREATININE 1.35* 1.29* 1.17 0.94 0.97  CALCIUM 9.6 9.5 9.9  10.1 9.8  MG 2.4 2.4 2.4 2.1 2.1   GFR: Estimated Creatinine Clearance: 86.8 mL/min (by C-G formula based on SCr of 0.97 mg/dL). Liver Function Tests: Recent Labs  Lab 10/12/18 0352 10/18/18 0402  AST 20 17  ALT 9 12  ALKPHOS 45 45  BILITOT 0.7 0.6  PROT 6.3* 6.3*  ALBUMIN 3.1* 2.6*   No results for input(s): LIPASE, AMYLASE in the last 168 hours. No results for input(s): AMMONIA in the last 168 hours. Coagulation Profile: No results for input(s): INR, PROTIME in the last 168 hours. Cardiac Enzymes: No results for input(s): CKTOTAL, CKMB, CKMBINDEX, TROPONINI in the last 168 hours. BNP (last 3 results) No results for input(s): PROBNP in the last 8760 hours. HbA1C: No results for input(s): HGBA1C in the last 72 hours. CBG: Recent Labs  Lab 10/15/18 0746 10/16/18 0811 10/16/18 1726 10/17/18 0812 10/18/18 0803  GLUCAP 91 108* 109* 98 145*   Lipid Profile: No results for input(s): CHOL, HDL, LDLCALC, TRIG, CHOLHDL, LDLDIRECT in the last 72 hours. Thyroid Function Tests: No results for input(s): TSH, T4TOTAL, FREET4, T3FREE, THYROIDAB in the last 72 hours. Anemia Panel: No results for input(s): VITAMINB12, FOLATE, FERRITIN, TIBC, IRON, RETICCTPCT in the last 72 hours. Sepsis Labs: No results for input(s): PROCALCITON, LATICACIDVEN in the last 168 hours.  Recent Results (from the past 240 hour(s))  Body fluid culture     Status: None (Preliminary result)   Collection Time: 10/15/18 10:22 AM  Result Value Ref Range Status   Specimen Description SYNOVIAL LEFT HIP  Final   Special Requests NONE  Final   Gram Stain NO WBC SEEN NO ORGANISMS SEEN   Final   Culture   Final    NO GROWTH 2 DAYS Performed at [facility], [address] [place], [place] [address]    Report Status PENDING  Incomplete         Radiology Studies: Dg Knee 1-2 Views Left  Result Date: 10/16/2018 CLINICAL DATA:  Pain after fall EXAM: LEFT KNEE - 1-2 VIEW COMPARISON:  None.  FINDINGS: No acute fracture or dislocation. No joint effusion. Moderate to severe degenerative changes. IMPRESSION: Degenerative changes.  No acute fracture noted. Electronically Signed   By: Dorise Bullion III M.D   On: 10/16/2018 20:32   Dg Knee 1-2 Views  Right  Result Date: 10/16/2018 CLINICAL DATA:  Pain after fall EXAM: RIGHT KNEE - 1-2 VIEW COMPARISON:  None. FINDINGS: No acute fracture or effusion. Moderate to severe degenerative changes. IMPRESSION: No acute abnormality. Electronically Signed   By: Dorise Bullion III M.D   On: 10/16/2018 20:32   Dg Chest Port 1 View  Result Date: 10/16/2018 CLINICAL DATA:  PICC line placement. EXAM: PORTABLE CHEST 1 VIEW COMPARISON:  Radiographs and CT 10/07/2018. FINDINGS: 1223 hours. Left arm PICC tip is not optimally visualized, although appears to extend to the lower SVC level. The heart size and mediastinal contours are stable. There is aortic atherosclerosis. Bilateral pleural thickening and subpleural rounded atelectasis peripherally in the left lung are unchanged. There is no pneumothorax. Glenohumeral degenerative changes are present bilaterally. IMPRESSION: PICC line tip at the lower SVC level. No pneumothorax or acute findings. Electronically Signed   By: Richardean Sale M.D.   On: 10/16/2018 13:03   Dg Foot 2 Views Left  Result Date: 10/17/2018 CLINICAL DATA:  Pain in left foot. EXAM: LEFT FOOT - 2 VIEW COMPARISON:  None. FINDINGS: Osteopenia. No fractures in the toes or metacarpals. Enthesopathic change at the posterior calcaneus. There is a lucency along the inferior aspect of the calcaneus which does not appear to extend through the calcaneus and is relatively well corticated. No other bony abnormalities. IMPRESSION: 1. There is a lucency along the inferior aspect of the calcaneus which is relatively well corticated and does not appear to extend through the calcaneus. This is favored to be nonacute. Recommend clinical correlation to exclude pain  in this region. Electronically Signed   By: Dorise Bullion III M.D   On: 10/17/2018 15:05   Dg Hips Bilat With Pelvis 2v  Result Date: 10/16/2018 CLINICAL DATA:  Fall.  Pain. EXAM: DG HIP (WITH OR WITHOUT PELVIS) 2V BILAT COMPARISON:  None. FINDINGS: The patient is status post bilateral hip replacements. Visualized hardware is in good position. No acute fractures. IMPRESSION: Bilateral hip replacements.  No acute fractures. Electronically Signed   By: Dorise Bullion III M.D   On: 10/16/2018 20:30        Scheduled Meds: . apixaban  5 mg Oral BID  . cycloSPORINE  1 drop Both Eyes BID  . diltiazem  180 mg Oral Daily  . escitalopram  10 mg Oral Daily  . gabapentin  100 mg Oral TID  . lidocaine  1 patch Transdermal Q24H  . Melatonin  6 mg Oral QHS  . midodrine  10 mg Oral TID WC  . multivitamin with minerals  1 tablet Oral Daily  . pantoprazole  40 mg Oral Daily  . ENSURE MAX PROTEIN  11 oz Oral BID  . senna-docusate  1 tablet Oral BID  . spironolactone  50 mg Oral Daily  . thiamine  100 mg Oral Daily   Continuous Infusions: . ertapenem Stopped (10/17/18 2229)     LOS: 14 days        Aline August, MD [facility] Pager [phone]  If 7PM-7AM, please contact night-coverage [email or web address] Password [facility] 10/18/2018, 8:28 AM

## 2018-10-18 NOTE — Progress Notes (Signed)
Patient still on Difficult to Place waitlist.   Cedric Fishman LCSW [phone]

## 2018-10-18 NOTE — Progress Notes (Signed)
Patient notified of 1215ml/ 24 hours fluid restriction. Patient states that know one has told him of this and that he is over his limit for today. Patient encouraged to try to only sip on liquids and leave most liquids for meals and meds. Patient verbalizes understanding and agrees. Patient irritable at times and not wanting to be bothered. This RN will try to let patient sleep. Patient encouraged to use call light or call RN of NT if anything is needed. Bed alarm on and call light within reach. Will continue to monitor.

## 2018-10-18 NOTE — Progress Notes (Signed)
Palliative:  I met today with Mr. Spinnato. He is frustrated with his increased pain since his fall. He is frustrated as he feels like most staff are not listening to him. He does endorse good pain control with increased OxyIR - no changes. He is not prepared to begin working with PT and increase activity at this time with increased pain.   With his frustration of setbacks with recurrent infection and worsening QOL he begins to express that he may not want to live this way any longer. We did clearly discuss that this is always his control and at any time he could opt for comfort care over aggressive care. I did explain that he could chose to stop antibiotics if he truly did not feel this was beneficial to him any further. He is not ready for this step. We brainstormed ways to try and increase his QOL within his current circumstances. He does share that he has always enjoyed and excelled at playing chess. I question if there are any volunteers through volunteer services that would enjoy playing chess with him??  Emotional support provided.   Exam: Alert, oriented. No distress. Sleeping with CPAP when I enter the room.   Plan: - Advance Directives complete.  - DNR decided.  - Continues antibiotics.  - Scheduled Ensure for poor intake.   25 min  Vinie Sill, NP Palliative Medicine Team Pager # [phone] (M-F 8a-5p) Team Phone # [phone] (Nights/Weekends)

## 2018-10-18 NOTE — Progress Notes (Signed)
PT Cancellation Note  Patient Details Name: ADEN SEK MRN: [id number] DOB: [birth year]   Cancelled Treatment:    Reason Eval/Treat Not Completed: Patient declined, no reason specified per RN, patient with no acute injury/fractures from fall but with low BP, OK to participate in modified session if patient agreeable. Attempted to work with patient, he enthusiastically refuses PT today due to pain and now fear of falling, requests that we try back tomorrow. Unable to convince him to sit up at EOB or even participate in bed level exercises.     Deniece Ree PT, DPT, CBIS  Supplemental Physical Therapist [facility]    Pager [phone] Acute Rehab Office [phone]

## 2018-10-18 NOTE — Progress Notes (Signed)
Paged Dr. Starla Link,  Pt wants antidepressant (visteril) and phenergan because "nobody will leave him alone".  BP's running low.  Last bp systolic 116, held diltiazem this am systolic in low 57'X.  Does he want to give IV phenergan?

## 2018-10-18 NOTE — Progress Notes (Signed)
Subjective:  Pt fell during PT sessino if I understand him correctly  Antibiotics:  Anti-infectives (From admission, onward)   Start     Dose/Rate Route Frequency Ordered Stop   10/15/18 1500  cefTRIAXone (ROCEPHIN) 2 g in sodium chloride 0.9 % 100 mL IVPB  Status:  Discontinued     2 g 200 mL/hr over 30 Minutes Intravenous Every 24 hours 10/15/18 1434 10/15/18 1447   10/15/18 1500  ertapenem (INVANZ) 1,000 mg in sodium chloride 0.9 % 100 mL IVPB     1 g 200 mL/hr over 30 Minutes Intravenous Every 24 hours 10/15/18 1447     10/08/18 0000  doxycycline (VIBRA-TABS) 100 MG tablet     100 mg Oral Every 12 hours 10/08/18 0921     10/06/18 1000  doxycycline (VIBRA-TABS) tablet 100 mg  Status:  Discontinued     100 mg Oral Every 12 hours 10/06/18 0726 10/11/18 1328   10/03/18 1800  metroNIDAZOLE (FLAGYL) tablet 500 mg  Status:  Discontinued     500 mg Oral Every 8 hours 10/03/18 1054 10/15/18 1447   10/03/18 0700  vancomycin (VANCOCIN) 2,000 mg in sodium chloride 0.9 % 500 mL IVPB  Status:  Discontinued     2,000 mg 250 mL/hr over 120 Minutes Intravenous Every 36 hours 10/01/18 1942 10/06/18 0726   10/02/18 0200  ceFEPIme (MAXIPIME) 2 g in sodium chloride 0.9 % 100 mL IVPB  Status:  Discontinued     2 g 200 mL/hr over 30 Minutes Intravenous Every 8 hours 10/01/18 1942 10/02/18 1008   10/01/18 1830  vancomycin (VANCOCIN) 2,500 mg in sodium chloride 0.9 % 500 mL IVPB     2,500 mg 250 mL/hr over 120 Minutes Intravenous STAT 10/01/18 1825 10/02/18 0126   10/01/18 1800  ceFEPIme (MAXIPIME) 2 g in sodium chloride 0.9 % 100 mL IVPB     2 g 200 mL/hr over 30 Minutes Intravenous  Once 10/01/18 1747 10/01/18 1903   10/01/18 1800  metroNIDAZOLE (FLAGYL) IVPB 500 mg  Status:  Discontinued     500 mg 100 mL/hr over 60 Minutes Intravenous Every 8 hours 10/01/18 1747 10/03/18 1054   10/01/18 1800  vancomycin (VANCOCIN) IVPB 1000 mg/200 mL premix  Status:  Discontinued     1,000 mg 200  mL/hr over 60 Minutes Intravenous  Once 10/01/18 1747 10/01/18 1825      Medications: Scheduled Meds: . apixaban  5 mg Oral BID  . cycloSPORINE  1 drop Both Eyes BID  . diltiazem  180 mg Oral Daily  . escitalopram  10 mg Oral Daily  . gabapentin  100 mg Oral TID  . lidocaine  1 patch Transdermal Q24H  . Melatonin  6 mg Oral QHS  . midodrine  10 mg Oral TID WC  . multivitamin with minerals  1 tablet Oral Daily  . pantoprazole  40 mg Oral Daily  . ENSURE MAX PROTEIN  11 oz Oral BID  . senna-docusate  1 tablet Oral BID  . spironolactone  50 mg Oral Daily  . thiamine  100 mg Oral Daily   Continuous Infusions: . ertapenem 1,000 mg (10/18/18 1658)   PRN Meds:.acetaminophen, albuterol, alum & mag hydroxide-simeth, diltiazem, hydrOXYzine, menthol-cetylpyridinium, [DISCONTINUED] ondansetron **OR** ondansetron (ZOFRAN) IV, oxyCODONE, polyethylene glycol, sodium chloride flush, traZODone    Objective: Weight change:   Intake/Output Summary (Last 24 hours) at 10/18/2018 1734 Last data filed at 10/18/2018 1328 Gross per 24 hour  Intake -  Output 820 ml  Net -820 ml   Blood pressure 103/71, pulse (!) 110, temperature 97.9 F (36.6 C), temperature source Oral, resp. rate 18, height 5' 8"  (1.727 m), weight 120.5 kg, SpO2 (!) 88 %. Temp:  [97.9 F (36.6 C)-99 F (37.2 C)] 97.9 F (36.6 C) (02/17 1503) Pulse Rate:  [86-114] 110 (02/17 1503) Resp:  [16-18] 18 (02/17 1503) BP: (92-109)/(58-79) 103/71 (02/17 1503) SpO2:  [88 %-92 %] 88 % (02/17 1503)  Physical Exam: General: Is asleep with CPAP but awoken answer my questions HEENT: EOMI CVS regular rate, normal  Chest: , no wheezing, no respiratory distress Abdomen: soft non-distended,  Wound not examined Neuro: nonfocal  CBC:    BMET Recent Labs    10/17/18 0341 10/18/18 0402  NA 134* 134*  K 4.6 4.8  CL 103 102  CO2 23 24  GLUCOSE 115* 102*  BUN 41* 38*  CREATININE 0.94 0.97  CALCIUM 10.1 9.8     Liver  Panel  Recent Labs    10/18/18 0402  PROT 6.3*  ALBUMIN 2.6*  AST 17  ALT 12  ALKPHOS 45  BILITOT 0.6       Sedimentation Rate No results for input(s): ESRSEDRATE in the last 72 hours. C-Reactive Protein No results for input(s): CRP in the last 72 hours.  Micro Results: Recent Results (from the past 720 hour(s))  Blood Culture (routine x 2)     Status: None   Collection Time: 10/01/18  6:15 PM  Result Value Ref Range Status   Specimen Description BLOOD LEFT HAND  Final   Special Requests   Final    BOTTLES DRAWN AEROBIC AND ANAEROBIC Blood Culture adequate volume   Culture   Final    NO GROWTH 5 DAYS Performed at [facility], [address], [place], [place] [address]    Report Status 10/06/2018 FINAL  Final  Blood Culture (routine x 2)     Status: None   Collection Time: 10/01/18  6:17 PM  Result Value Ref Range Status   Specimen Description BLOOD RIGHT ANTECUBITAL  Final   Special Requests   Final    BOTTLES DRAWN AEROBIC AND ANAEROBIC Blood Culture adequate volume   Culture   Final    NO GROWTH 5 DAYS Performed at [facility], [place] [address] [place], [place] [address]    Report Status 10/06/2018 FINAL  Final  Urine culture     Status: Abnormal   Collection Time: 10/01/18  6:24 PM  Result Value Ref Range Status   Specimen Description URINE, CLEAN CATCH  Final   Special Requests   Final    NONE Performed at [facility], [place] [address], [place], [place] [address]    Culture 20,000 COLONIES/mL PROTEUS MIRABILIS (A)  Final   Report Status 10/03/2018 FINAL  Final   Organism ID, Bacteria PROTEUS MIRABILIS (A)  Final      Susceptibility   Proteus mirabilis - MIC*    AMPICILLIN <=2 SENSITIVE Sensitive     CEFAZOLIN <=4 SENSITIVE Sensitive     CEFTRIAXONE <=1 SENSITIVE Sensitive     CIPROFLOXACIN <=0.25 SENSITIVE Sensitive     GENTAMICIN <=1 SENSITIVE Sensitive     IMIPENEM 8 INTERMEDIATE Intermediate     NITROFURANTOIN 128 RESISTANT  Resistant     TRIMETH/SULFA <=20 SENSITIVE Sensitive     AMPICILLIN/SULBACTAM <=2 SENSITIVE Sensitive     PIP/TAZO <=4 SENSITIVE Sensitive     * 20,000 COLONIES/mL PROTEUS MIRABILIS  Body fluid culture     Status: None   Collection Time: 10/15/18 10:22 AM  Result Value Ref Range Status   Specimen Description SYNOVIAL LEFT HIP  Final   Special Requests NONE  Final   Gram Stain NO WBC SEEN NO ORGANISMS SEEN   Final   Culture   Final    NO GROWTH 3 DAYS Performed at [facility], [address], [place], [place] [address]    Report Status 10/18/2018 FINAL  Final    Studies/Results: Dg Knee 1-2 Views Left  Result Date: 10/16/2018 CLINICAL DATA:  Pain after fall EXAM: LEFT KNEE - 1-2 VIEW COMPARISON:  None. FINDINGS: No acute fracture or dislocation. No joint effusion. Moderate to severe degenerative changes. IMPRESSION: Degenerative changes.  No acute fracture noted. Electronically Signed   By: Dorise Bullion III M.D   On: 10/16/2018 20:32   Dg Knee 1-2 Views Right  Result Date: 10/16/2018 CLINICAL DATA:  Pain after fall EXAM: RIGHT KNEE - 1-2 VIEW COMPARISON:  None. FINDINGS: No acute fracture or effusion. Moderate to severe degenerative changes. IMPRESSION: No acute abnormality. Electronically Signed   By: Dorise Bullion III M.D   On: 10/16/2018 20:32   Dg Foot 2 Views Left  Result Date: 10/17/2018 CLINICAL DATA:  Pain in left foot. EXAM: LEFT FOOT - 2 VIEW COMPARISON:  None. FINDINGS: Osteopenia. No fractures in the toes or metacarpals. Enthesopathic change at the posterior calcaneus. There is a lucency along the inferior aspect of the calcaneus which does not appear to extend through the calcaneus and is relatively well corticated. No other bony abnormalities. IMPRESSION: 1. There is a lucency along the inferior aspect of the calcaneus which is relatively well corticated and does not appear to extend through the calcaneus. This is favored to be nonacute. Recommend clinical  correlation to exclude pain in this region. Electronically Signed   By: Dorise Bullion III M.D   On: 10/17/2018 15:05   Dg Hips Bilat With Pelvis 2v  Result Date: 10/16/2018 CLINICAL DATA:  Fall.  Pain. EXAM: DG HIP (WITH OR WITHOUT PELVIS) 2V BILAT COMPARISON:  None. FINDINGS: The patient is status post bilateral hip replacements. Visualized hardware is in good position. No acute fractures. IMPRESSION: Bilateral hip replacements.  No acute fractures. Electronically Signed   By: Dorise Bullion III M.D   On: 10/16/2018 20:30      Assessment/Plan:  INTERVAL HISTORY: Cultures no growth final  Principal Problem:   Prosthetic hip infection ([place]) Active Problems:   Left hip postoperative wound infection   AKI (acute kidney injury) ([place])   Depression   Pleural effusion on left   OSA on CPAP   Unspecified atrial fibrillation ([place])   Physical debility   Hyperkalemia   Other cirrhosis of liver ([place])   Chronic back pain   Goals of care, counseling/discussion   Palliative care encounter    Frank BRUMETT is a 73 y.o. male with resumed polymicrobial infection of his buttock joint with most recent surgery in November at [facility] involving debridement but retention of prosthesis cultures which yielded BacteroidesFragilis, status post Invanz and then attempts at suppression with metronidazole now with severe pain yet again and inflammatory markers elevated.  # 1  Polymicrobial septic prosthetic joint:  Dr Maureen Ralphs seen the patient and greatly appreciate him.  Changing to ertapenem and will plan on giving him a 6-week course of this followed by oral chronic suppressive therapy  #2 metronidazole induced neuropathy: Hopefully the  IV antibiotics with Colbert Ewing will give him a holiday from this symptom.  I will followup cutures and arrange HSFU in my clinic  He needs PICC line and dc either to SNF vs with home health   Diagnosis: Polymicrobial prosthetic joint infection  Culture  Result: Pending  Allergies  Allergen Reactions  . Bee Venom Swelling  . Nickel Rash    OPAT Orders Discharge antibiotics: 1 g daily ertapenem   duration: 6 weeks End Date:  November 25, 2018 [facility] Care Per Protocol:  Labs weekly while on IV antibiotics: _x_ CBC with differential _x_ BMP  _x_ CRP _x_ ESR   __ Please pull PIC at completion of IV antibiotics _x_ Please leave PIC in place until doctor has seen patient or been notified  Fax weekly labs to [phone]  Clinic Follow Up Appt:  March 18th, 2020 @ [facility]  I will sign off for now please call with further questions.    LOS: 14 days   Alcide Evener 10/18/2018, 5:34 PM

## 2018-10-18 NOTE — Progress Notes (Signed)
PHARMACY CONSULT NOTE FOR:  OUTPATIENT  PARENTERAL ANTIBIOTIC THERAPY (OPAT)  Indication: Prosthetic hip infection Regimen: Ertapenem 1 gm every 24 hours End date: 11/25/2018  IV antibiotic discharge orders are pended. To discharging provider:  please sign these orders via discharge navigator,  Select New Orders & click on the button choice - Manage This Unsigned Work.     Thank you for allowing pharmacy to be a part of this patient's care.  Jimmy Footman, PharmD, BCPS, BCIDP Infectious Diseases Clinical Pharmacist Phone: [phone] 10/18/2018, 11:49 AM

## 2018-10-18 NOTE — Progress Notes (Deleted)
Duplicate note - will delete

## 2018-10-18 NOTE — Progress Notes (Signed)
Spoke with Dr. Starla Link on floor,  pts last three bp's 103/58, 92/70, 98/63 does he want to hold this am dose Diltiazem 180 mg po 10 am dose.  V/O Hold this 10 am dose Diltiazem 180 mg po.

## 2018-10-18 NOTE — Progress Notes (Signed)
Patient placed home cpap without any issues. Will continue to monitor pt.

## 2018-10-19 LAB — CBC WITH DIFFERENTIAL/PLATELET
Abs Immature Granulocytes: 0.03 10*3/uL (ref 0.00–0.07)
Basophils Absolute: 0 10*3/uL (ref 0.0–0.1)
Basophils Relative: 1 %
Eosinophils Absolute: 0.2 10*3/uL (ref 0.0–0.5)
Eosinophils Relative: 3 %
HCT: 33.7 % — ABNORMAL LOW (ref 39.0–52.0)
Hemoglobin: 10.1 g/dL — ABNORMAL LOW (ref 13.0–17.0)
Immature Granulocytes: 0 %
Lymphocytes Relative: 22 %
Lymphs Abs: 1.5 10*3/uL (ref 0.7–4.0)
MCH: 26.9 pg (ref 26.0–34.0)
MCHC: 30 g/dL (ref 30.0–36.0)
MCV: 89.6 fL (ref 80.0–100.0)
Monocytes Absolute: 1 10*3/uL (ref 0.1–1.0)
Monocytes Relative: 14 %
Neutro Abs: 4.4 10*3/uL (ref 1.7–7.7)
Neutrophils Relative %: 60 %
Platelets: 274 10*3/uL (ref 150–400)
RBC: 3.76 MIL/uL — ABNORMAL LOW (ref 4.22–5.81)
RDW: 18 % — ABNORMAL HIGH (ref 11.5–15.5)
WBC: 7.1 10*3/uL (ref 4.0–10.5)
nRBC: 0 % (ref 0.0–0.2)

## 2018-10-19 LAB — BASIC METABOLIC PANEL
Anion gap: 8 (ref 5–15)
BUN: 38 mg/dL — ABNORMAL HIGH (ref 8–23)
CO2: 24 mmol/L (ref 22–32)
Calcium: 9.8 mg/dL (ref 8.9–10.3)
Chloride: 103 mmol/L (ref 98–111)
Creatinine, Ser: 0.95 mg/dL (ref 0.61–1.24)
GFR calc Af Amer: >60 mL/min (ref >60)
GFR calc non Af Amer: >60 mL/min (ref >60)
Glucose, Bld: 105 mg/dL — ABNORMAL HIGH (ref 70–99)
Potassium: 4.7 mmol/L (ref 3.5–5.1)
Sodium: 135 mmol/L (ref 135–145)

## 2018-10-19 LAB — MAGNESIUM: Magnesium: 2 mg/dL (ref 1.7–2.4)

## 2018-10-19 LAB — GLUCOSE, CAPILLARY: Glucose-Capillary: 85 mg/dL (ref 70–99)

## 2018-10-19 MED ORDER — ENSURE ENLIVE PO LIQD: 237.0000 mL | Freq: Two times a day (BID) | ORAL | Status: DC

## 2018-10-19 MED ORDER — ENSURE MAX PROTEIN PO LIQD
11.0000 [oz_av] | Freq: Two times a day (BID) | ORAL | Status: DC
  Administered 2018-10-19 – 2018-12-06 (×95): 11 [oz_av] via ORAL
  Filled 2018-10-19 (×74): qty 330
  Filled 2018-10-19: qty 660
  Filled 2018-10-19 (×30): qty 330

## 2018-10-19 NOTE — Progress Notes (Signed)
Physical Therapy Treatment Patient Details Name: [name] MRN: [id number] DOB: 17-Oct-1945 Today's Date: 10/19/2018    History of Present Illness Pt is a 73 year old male with PMHx significant for HTN, afib on Eliquis, OSA, morbid obesity, L THA and hip infection and history of multiple recent prolonged hospitalizations.  Pt presented to ED with generalized weakness, falls, and shortness of breath- work-up in the emergency room showed A. fib heart rate 108 and a recurrent right-sided pleural effusion    PT Comments    Pt needing max encouragement and a measure of control over the treatment session.  Emphasis on transition to EOB and sit to stand x2.  Pt also worked on scooting back in bed with assist.    Follow Up Recommendations  SNF     Equipment Recommendations  None recommended by PT    Recommendations for Other Services       Precautions / Restrictions Precautions Precautions: Fall    Mobility  Bed Mobility Overal bed mobility: Needs Assistance Bed Mobility: Supine to Sit;Sit to Supine     Supine to sit: Mod assist;+2 for physical assistance Sit to supine: Max assist;+2 for physical assistance      Transfers Overall transfer level: Needs assistance Equipment used: Rolling walker (2 wheeled) Transfers: Sit to/from Stand Sit to Stand: Mod assist;+2 physical assistance(x2)         General transfer comment: pt stood in the RW fairly upright for 15-20 secs x2, but refused to continue  Ambulation/Gait                 Stairs             Wheelchair Mobility    Modified Rankin (Stroke Patients Only)       Balance     Sitting balance-[name]: Fair Sitting balance - Comments: but preferred to have his UE assist                                    Cognition Arousal/Alertness: Awake/alert Behavior During Therapy: Anxious Overall Cognitive Status: Within Functional Limits for tasks assessed                                         Exercises Other Exercises Other Exercises: warm up hip/knee flex/ext ROM with graded resistance in extension bil x 7 reps    General Comments        Pertinent Vitals/Pain Pain Assessment: Faces Faces Pain Scale: Hurts little more Pain Location: back Pain Descriptors / Indicators: Grimacing;Guarding Pain Intervention(s): Monitored during session    Home Living                      Prior Function            PT Goals (current goals can now be found in the care plan section) Acute Rehab PT Goals PT Goal Formulation: With patient Time For Goal Achievement: 10/30/18 Potential to Achieve Goals: Fair Progress towards PT goals: Progressing toward goals(slow progression, goals continued)    Frequency    Min 2X/week      PT Plan Current plan remains appropriate    Co-evaluation              AM-PAC PT "6 Clicks" Mobility   Outcome Measure  Help needed  turning from your back to your side while in a flat bed without using bedrails?: A Lot Help needed moving from lying on your back to sitting on the side of a flat bed without using bedrails?: A Lot Help needed moving to and from a bed to a chair (including a wheelchair)?: A Lot Help needed standing up from a chair using your arms (e.g., wheelchair or bedside chair)?: A Lot Help needed to walk in hospital room?: Total Help needed climbing 3-5 steps with a railing? : Total 6 Click Score: 10    End of Session   Activity Tolerance: Patient limited by pain;Other (comment)(limited by fear.) Patient left: in bed;with call bell/phone within reach;with bed alarm set Nurse Communication: Mobility status PT Visit Diagnosis: Other abnormalities of gait and mobility (R26.89);Muscle weakness (generalized) (M62.81);Difficulty in walking, not elsewhere classified (R26.2);History of falling (Z91.81)     Time: 2174-7159 PT Time Calculation (min) (ACUTE ONLY): 30 min  Charges:  $Therapeutic  Activity: 23-37 mins                     10/19/2018  [name], PT Acute Rehabilitation Services [phone]  (pager) [phone]  (office)   [name] 10/19/2018, 6:00 PM

## 2018-10-19 NOTE — Care Management Important Message (Signed)
Important Message  Patient Details  Name: [name] MRN: [id number] Date of Birth: 1946-08-10   Medicare Important Message Given:  Yes    Tawana Pasch P Antonietta Lansdowne 10/19/2018, 1:06 PM

## 2018-10-19 NOTE — Progress Notes (Signed)
Nutrition Follow-up  DOCUMENTATION CODES:   Morbid obesity  INTERVENTION:   -Continue Ensure Max BID, each supplement provides 150 kcals and 30 grams protein  -Continue MVI with minerals daily  NUTRITION DIAGNOSIS:   Increased nutrient needs related to chronic illness (cirrhosis) as evidenced by estimated needs.  Ongoing  GOAL:   Patient will meet greater than or equal to 90% of their needs  Progressing  MONITOR:   PO intake, Supplement acceptance, Labs, Weight trends, Skin, I & O's  REASON FOR ASSESSMENT:   Malnutrition Screening Tool    ASSESSMENT:   73 y.o. male with medical history significant for chronic back and right shoulder pain, OSA on CPAP, paroxysmal atrial fibrillation on Eliquis, hepatic cirrhosis, recurrent pleural effusions, and status post left total hip arthroplasty complicated by infection.  2/14 - s/p IR aspiration of hip joint 2/15 - PICC placed  Spoke with pt at bedside who reports that his appetite is "fair." Noted 100% completed lunch meal tray at bedside along with 100% completed Ensure Max oral nutrition supplement. Pt states that he is drinking the Ensure Max supplements.  Pt requested graham crackers and diet Sprite. RD provided these to pt. RN aware.  Weight stable since admission weight with some fluctuations. Will continue to monitor weight trends.  Noted palliative care team following pt.  Per social work note, pt remains on difficult to place SNF list.  Meal Completion: 25-100% x last 8 meals (average 88%)  Medications reviewed and include: MVI with minerals daily, Protonix, Ensure Max BID, Senna, thiamine, IV antibiotics  Labs reviewed: BUN 38 (H) CBG's: 85 today  UOP: 1850 ml x 24 hours I/O's: -9.6 L since admit  Diet Order:   Diet Order            Diet - low sodium heart healthy        Diet 2 gram sodium Room service appropriate? Yes; Fluid consistency: Thin; Fluid restriction: 1200 mL Fluid  Diet effective now              EDUCATION NEEDS:   Education needs have been addressed  Skin:  Skin Assessment: Skin Integrity Issues: Incisions: L hip Other: non-pressure wound to R buttocks  Last BM:  2/17 (large type 3)  Height:   Ht Readings from Last 1 Encounters:  10/01/18 5\' 8"  (1.727 m)    Weight:   Wt Readings from Last 1 Encounters:  10/19/18 123.4 kg    Ideal Body Weight:  70 kg  BMI:  Body mass index is 41.36 kg/m.  Estimated Nutritional Needs:   Kcal:  2229-7989  Protein:  125-140 grams  Fluid:  2.2-2.4 L    Gaynell Face, MS, RD, LDN Inpatient Clinical Dietitian Pager: [phone] Weekend/After Hours: [phone]

## 2018-10-19 NOTE — Progress Notes (Signed)
Patient ID: [name], male   DOB: 20-Jul-1946, 73 y.o.   MRN: [id number]  PROGRESS NOTE    [name]  BSJ:[id number] DOB: 1946/06/28 DOA: 10/01/2018 PCP: [name], MD   Brief Narrative:  73 year old male with history of A. 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  Found to have soft tissue infection of the left hip area, acute kidney injury and hyperkalemia. He was initially hydrated but then developed volume overload for which he was diuresed with albumin and Lasix.  ID and orthopedics were consulted for left hip infection.  He had IR guided left hip aspiration on 10/15/2018.  ID is recommending 6 weeks of IV Invanz.  Assessment & Plan:   Principal Problem:   Prosthetic joint infection of left hip (HCC) Active Problems:   Left hip postoperative wound infection   AKI (acute kidney injury) ([place])   Depression   Pleural effusion on left   OSA on CPAP   Unspecified atrial fibrillation (HCC)   Physical debility   Hyperkalemia   Other cirrhosis of liver (HCC)   Chronic back pain   Goals of care, counseling/discussion   Palliative care encounter  Left hip cellulitis with chronic left hip prosthetic joint infection -Soft tissue CT scan done at the time of admission of the left hip did not show any joint involvement. Orthopedic evaluated the patient and apparently patient is not a candidate for salvage procedures.  Orthopedics recommended IR guided aspiration and fluid analysis.  Most recent surgery in November 2019 at [facility] involving debridement but retention of prosthesis yielded Bacteroides  fragilis and cultures, status post Invanz and attempts at suppression with metronidazole now. -Status post IR guided aspiration on 10/15/2018 which had 20 white cells.  Cultures negative so far.  ID has started patient on Invanz and recommend 6 weeks of IV Invanz.  Flagyl has been discontinued. -Status post PICC line placement on 10/16/2018.  Fall and left knee pain -Patient apparently fell on 10/16/2018 and since then has been complaining of left knee pain. -No fracture seen on x-ray.  Fall precautions.  Pain management.  AKI due to ATN with hyperkalemia -Initially due to dehydration which improved after IV fluids.  Subsequently developed volume overload for which he was aggressively diuresed with IV albumin and Lasix.  Subsequently renal function became slightly worse hence Lasix was discontinued.  Currently on low-dose Aldactone.  Creatinine stable.  Will repeat a.m. labs.  Strict input and output.  Daily weights.  Continue fluid restriction.  Negative balance of 8505.2 cc since admission.  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  Continue spironolactone if blood pressure allows.  Lasix on hold for now.  Might need intermittent Lasix as well.  Respiratory status is stable; will hold off on thoracentesis at this time.  Chronic atrial fibrillation -Rate controlled.  Continue Cardizem and Eliquis.  Chronic back and right shoulder pain -Continue Lidoderm patch, Tylenol and as needed narcotics  Bipolar disorder -Stable.  Continue Lexapro and Neurontin  OSA -Continue CPAP nightly  Morbid obesity -Follow-up with PCP  Generalized deconditioning -Patient is very deconditioned.  PT is recommending SNF.  Social worker aware and following. -Palliative care evaluation pending.  Overall prognosis is guarded to poor.   DVT prophylaxis:  Eliquis code Status: DNR  family Communication: None at  bedside Disposition Plan: SNF once bed is available Consultants: Orthopedic/ID/IR  Procedures: IR guided left hip aspiration on 10/15/2018  Antimicrobials:  Anti-infectives (From admission, onward)   Start     Dose/Rate Route Frequency Ordered Stop   10/15/18 1500  cefTRIAXone (ROCEPHIN) 2 g in sodium chloride 0.9 % 100 mL IVPB  Status:  Discontinued     2 g 200 mL/hr over 30 Minutes Intravenous Every 24 hours 10/15/18 1434 10/15/18 1447   10/15/18 1500  ertapenem (INVANZ) 1,000 mg in sodium chloride 0.9 % 100 mL IVPB     1 g 200 mL/hr over 30 Minutes Intravenous Every 24 hours 10/15/18 1447     10/08/18 0000  doxycycline (VIBRA-TABS) 100 MG tablet     100 mg Oral Every 12 hours 10/08/18 0921     10/06/18 1000  doxycycline (VIBRA-TABS) tablet 100 mg  Status:  Discontinued     100 mg Oral Every 12 hours 10/06/18 0726 10/11/18 1328   10/03/18 1800  metroNIDAZOLE (FLAGYL) tablet 500 mg  Status:  Discontinued     500 mg Oral Every 8 hours 10/03/18 1054 10/15/18 1447   10/03/18 0700  vancomycin (VANCOCIN) 2,000 mg in sodium chloride 0.9 % 500 mL IVPB  Status:  Discontinued     2,000 mg 250 mL/hr over 120 Minutes Intravenous Every 36 hours 10/01/18 1942 10/06/18 0726   10/02/18 0200  ceFEPIme (MAXIPIME) 2 g in sodium chloride 0.9 % 100 mL IVPB  Status:  Discontinued     2 g 200 mL/hr over 30 Minutes Intravenous Every 8 hours 10/01/18 1942 10/02/18 1008   10/01/18 1830  vancomycin (VANCOCIN) 2,500 mg in sodium chloride 0.9 % 500 mL IVPB     2,500 mg 250 mL/hr over 120 Minutes Intravenous STAT 10/01/18 1825 10/02/18 0126   10/01/18 1800  ceFEPIme (MAXIPIME) 2 g in sodium chloride 0.9 % 100 mL IVPB     2 g 200 mL/hr over 30 Minutes Intravenous  Once 10/01/18 1747 10/01/18 1903   10/01/18 1800  metroNIDAZOLE (FLAGYL) IVPB 500 mg  Status:  Discontinued     500 mg 100 mL/hr over 60 Minutes Intravenous Every 8 hours 10/01/18 1747 10/03/18 1054   10/01/18 1800  vancomycin (VANCOCIN) IVPB 1000  mg/200 mL premix  Status:  Discontinued     1,000 mg 200 mL/hr over 60 Minutes Intravenous  Once 10/01/18 1747 10/01/18 1825          Subjective: Patient seen and examined at bedside.  He is a poor historian.  He is sleeping, wakes up slightly and denies any worsening abdominal pain or shortness of breath.  Still complains of left lower extremity pain.  No overnight fever or vomiting.    Objective: Vitals:   10/18/18 1503 10/18/18 2117 10/19/18 0500 10/19/18 0531  BP: 103/71 102/72  100/64  Pulse: (!) 110 (!) 104  (!) 104  Resp: 18 17  18   Temp: 97.9 F (36.6 C) 99.1 F (37.3 C)    TempSrc: Oral Oral    SpO2: (!) 88% 93%  93%  Weight:   123.4 kg   Height:        Intake/Output Summary (Last 24 hours) at 10/19/2018 0949 Last data filed at 10/19/2018 0659 Gross per 24 hour  Intake 1889.78 ml  Output 1850 ml  Net  39.78 ml   Filed Weights   10/16/18 0445 10/17/18 1400 10/19/18 0500  Weight: 120 kg 120.5 kg 123.4 kg    Examination:  General exam: Appears older than stated age.  Lying in bed.  No distress.  Looks chronically ill  respiratory system: Bilateral decreased breath sounds at bases with basilar crackles Cardiovascular system: S1-S2 heard, slightly tachycardic Gastrointestinal system: Abdomen is distended, soft and nontender. Normal bowel sounds heard. Extremities: lower extremity edema present; no cyanosis  Data Reviewed: I have personally reviewed following labs and imaging studies  CBC: Recent Labs  Lab 10/14/18 0343 10/15/18 0402 10/17/18 0341 10/18/18 0402 10/19/18 0407  WBC 8.0 8.5 8.8 8.2 7.1  NEUTROABS 5.1 5.7 6.7 5.6 4.4  HGB 11.3* 11.8* 11.4* 10.9* 10.1*  HCT 35.3* 37.8* 35.7* 34.3* 33.7*  MCV 86.3 86.9 88.1 89.1 89.6  PLT 291 323 313 295 341   Basic Metabolic Panel: Recent Labs  Lab 10/14/18 0343 10/15/18 0402 10/17/18 0341 10/18/18 0402 10/19/18 0407  NA 134* 133* 134* 134* 135  K 4.4 4.6 4.6 4.8 4.7  CL 103 99 103 102 103  CO2  21* 23 23 24 24   GLUCOSE 142* 98 115* 102* 105*  BUN 55* 53* 41* 38* 38*  CREATININE 1.29* 1.17 0.94 0.97 0.95  CALCIUM 9.5 9.9 10.1 9.8 9.8  MG 2.4 2.4 2.1 2.1 2.0   GFR: Estimated Creatinine Clearance: 89.9 mL/min (by C-G formula based on SCr of 0.95 mg/dL). Liver Function Tests: Recent Labs  Lab 10/18/18 0402  AST 17  ALT 12  ALKPHOS 45  BILITOT 0.6  PROT 6.3*  ALBUMIN 2.6*   No results for input(s): LIPASE, AMYLASE in the last 168 hours. No results for input(s): AMMONIA in the last 168 hours. Coagulation Profile: No results for input(s): INR, PROTIME in the last 168 hours. Cardiac Enzymes: No results for input(s): CKTOTAL, CKMB, CKMBINDEX, TROPONINI in the last 168 hours. BNP (last 3 results) No results for input(s): PROBNP in the last 8760 hours. HbA1C: No results for input(s): HGBA1C in the last 72 hours. CBG: Recent Labs  Lab 10/16/18 0811 10/16/18 1726 10/17/18 0812 10/18/18 0803 10/19/18 0758  GLUCAP 108* 109* 98 145* 85   Lipid Profile: No results for input(s): CHOL, HDL, LDLCALC, TRIG, CHOLHDL, LDLDIRECT in the last 72 hours. Thyroid Function Tests: No results for input(s): TSH, T4TOTAL, FREET4, T3FREE, THYROIDAB in the last 72 hours. Anemia Panel: No results for input(s): VITAMINB12, FOLATE, FERRITIN, TIBC, IRON, RETICCTPCT in the last 72 hours. Sepsis Labs: No results for input(s): PROCALCITON, LATICACIDVEN in the last 168 hours.  Recent Results (from the past 240 hour(s))  Body fluid culture     Status: None   Collection Time: 10/15/18 10:22 AM  Result Value Ref Range Status   Specimen Description SYNOVIAL LEFT HIP  Final   Special Requests NONE  Final   Gram Stain NO WBC SEEN NO ORGANISMS SEEN   Final   Culture   Final    NO GROWTH 3 DAYS Performed at [facility], [address] [place], [place] [address]    Report Status 10/18/2018 FINAL  Final         Radiology Studies: Dg Foot 2 Views Left  Result Date:  10/17/2018 CLINICAL DATA:  Pain in left foot. EXAM: LEFT FOOT - 2 VIEW COMPARISON:  None. FINDINGS: Osteopenia. No fractures in the toes or metacarpals. Enthesopathic change at the posterior calcaneus. There is a lucency along the inferior aspect of the calcaneus  which does not appear to extend through the calcaneus and is relatively well corticated. No other bony abnormalities. IMPRESSION: 1. There is a lucency along the inferior aspect of the calcaneus which is relatively well corticated and does not appear to extend through the calcaneus. This is favored to be nonacute. Recommend clinical correlation to exclude pain in this region. Electronically Signed   By: Dorise Bullion III M.D   On: 10/17/2018 15:05        Scheduled Meds: . apixaban  5 mg Oral BID  . cycloSPORINE  1 drop Both Eyes BID  . diltiazem  180 mg Oral Daily  . escitalopram  10 mg Oral Daily  . feeding supplement (ENSURE ENLIVE)  237 mL Oral BID BM  . gabapentin  100 mg Oral TID  . lidocaine  1 patch Transdermal Q24H  . Melatonin  6 mg Oral QHS  . midodrine  10 mg Oral TID WC  . multivitamin with minerals  1 tablet Oral Daily  . pantoprazole  40 mg Oral Daily  . ENSURE MAX PROTEIN  11 oz Oral BID  . senna-docusate  1 tablet Oral BID  . spironolactone  50 mg Oral Daily  . thiamine  100 mg Oral Daily   Continuous Infusions: . sodium chloride 10 mL/hr at 10/19/18 0659  . ertapenem 1,000 mg (10/18/18 1658)     LOS: 15 days        Aline August, MD [facility] Pager [phone]  If 7PM-7AM, please contact night-coverage [email or web address] Password [facility] 10/19/2018, 9:49 AM

## 2018-10-20 DIAGNOSIS — T8452XD Infection and inflammatory reaction due to internal left hip prosthesis, subsequent encounter: Secondary | ICD-10-CM

## 2018-10-20 LAB — CBC WITH DIFFERENTIAL/PLATELET
Abs Immature Granulocytes: 0.02 10*3/uL (ref 0.00–0.07)
Basophils Absolute: 0 10*3/uL (ref 0.0–0.1)
Basophils Relative: 0 %
Eosinophils Absolute: 0.2 10*3/uL (ref 0.0–0.5)
Eosinophils Relative: 2 %
HCT: 34.1 % — ABNORMAL LOW (ref 39.0–52.0)
Hemoglobin: 10.2 g/dL — ABNORMAL LOW (ref 13.0–17.0)
Immature Granulocytes: 0 %
Lymphocytes Relative: 26 %
Lymphs Abs: 1.8 10*3/uL (ref 0.7–4.0)
MCH: 26.9 pg (ref 26.0–34.0)
MCHC: 29.9 g/dL — ABNORMAL LOW (ref 30.0–36.0)
MCV: 90 fL (ref 80.0–100.0)
Monocytes Absolute: 0.9 10*3/uL (ref 0.1–1.0)
Monocytes Relative: 12 %
Neutro Abs: 4.2 10*3/uL (ref 1.7–7.7)
Neutrophils Relative %: 60 %
Platelets: 287 10*3/uL (ref 150–400)
RBC: 3.79 MIL/uL — ABNORMAL LOW (ref 4.22–5.81)
RDW: 18.1 % — ABNORMAL HIGH (ref 11.5–15.5)
WBC: 7.1 10*3/uL (ref 4.0–10.5)
nRBC: 0 % (ref 0.0–0.2)

## 2018-10-20 LAB — BASIC METABOLIC PANEL
Anion gap: 7 (ref 5–15)
BUN: 41 mg/dL — ABNORMAL HIGH (ref 8–23)
CO2: 26 mmol/L (ref 22–32)
Calcium: 9.7 mg/dL (ref 8.9–10.3)
Chloride: 103 mmol/L (ref 98–111)
Creatinine, Ser: 0.94 mg/dL (ref 0.61–1.24)
GFR calc Af Amer: >60 mL/min (ref >60)
GFR calc non Af Amer: >60 mL/min (ref >60)
Glucose, Bld: 117 mg/dL — ABNORMAL HIGH (ref 70–99)
Potassium: 4.5 mmol/L (ref 3.5–5.1)
Sodium: 136 mmol/L (ref 135–145)

## 2018-10-20 LAB — MAGNESIUM: Magnesium: 2.1 mg/dL (ref 1.7–2.4)

## 2018-10-20 LAB — GLUCOSE, CAPILLARY: Glucose-Capillary: 81 mg/dL (ref 70–99)

## 2018-10-20 MED ORDER — LORAZEPAM 1 MG PO TABS
1.0000 mg | ORAL_TABLET | Freq: Once | ORAL | Status: AC
  Administered 2018-10-20: 1 mg via ORAL
  Filled 2018-10-20: qty 1

## 2018-10-20 NOTE — Progress Notes (Signed)
PROGRESS NOTE        PATIENT DETAILS Name: [name] Age: 73 y.o. Sex: male Date of Birth: 01-Aug-1946 Admit Date: 10/01/2018 Admitting Physician Vianne Bulls, MD PNT:IRWERX, Hollice Espy, MD  Brief Narrative: Patient is a 73 y.o. male with history of A. 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  Found to have a soft tissue infection of the left hip area, acute kidney injury and hyperkalemia.  ID, orthopedics consulted-underwent left hip aspiration on 2/14, infectious disease recommending 6 weeks of IV Invanz.   See below for further details  Subjective: Lying comfortably in bed-no chest pain or shortness of breath.  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  Was on suppressive Flagyl for Bacteroides fragilis infection-recommendations from ID are for 6 weeks of IV ertapenem.  AKI: Likely hemodynamically mediated-secondary to soft tissue infection of the left hip, diuretic use.  Resolved with supportive care.  Follow electrolytes periodically.  Hyperkalemia: Suspect secondary to AKI with use of Aldactone and potassium supplementation.  Resolved.  History of left total hip arthroplasty with subsequent infection with Bacteroides fragilis: Patient was evaluated by infectious disease at [facility] was previously on Flagyl.  Now on IV  Invanz-Flagyl has been discontinued.  Recurrent pleural effusion: Felt to be secondary to hepatic hydrothorax-most recent CT chest on 10/07/2018 shows a small pleural effusion-patient is asymptomatic at this point.  Doubt further work-up required-furthermore he has had a recent extensive work-up including multiple thoracocentesis   Cryptogenic cirrhosis: Prior hepatitis serology done earlier this month was negative-agree with plans outlined in prior discharge summary for outpatient follow-up.  Volume status is stable-he is awake and alert-continue Aldactone  Atrial fibrillation: Rate controlled with Cardizem.Continue Eliquis.  Chronic back and right shoulder pain: Continue Lidoderm patch, Tylenol and as needed narcotics.  Bipolar disorder: Stable-continue Lexapro, Neurontin  OSA: Continue CPAP nightly  Morbid obesity  Acute on chronic debility/deconditioning: Has chronic debility/deconditioning at baseline-this is worsened due to acute illness/AKI/left hip infection.  Per social work-very difficult to place to SNF.  DVT Prophylaxis: Full dose anticoagulation with Eliquis  Code Status: Full code  Family Communication: None at bedside  Disposition Plan: Remain inpatient- SNF on discharge when bed available  Antimicrobial agents: Anti-infectives (From admission, onward)   Start     Dose/Rate Route Frequency Ordered Stop   10/15/18 1500  cefTRIAXone (ROCEPHIN) 2 g in sodium chloride 0.9 % 100 mL IVPB  Status:  Discontinued     2 g 200 mL/hr over 30 Minutes Intravenous Every 24 hours 10/15/18 1434 10/15/18 1447   10/15/18 1500  ertapenem (INVANZ) 1,000 mg in sodium chloride 0.9 % 100 mL IVPB     1 g 200 mL/hr over 30 Minutes Intravenous Every 24 hours 10/15/18 1447     10/08/18 0000  doxycycline (VIBRA-TABS) 100 MG tablet     100 mg  Oral Every 12 hours 10/08/18 0921     10/06/18 1000  doxycycline (VIBRA-TABS) tablet 100 mg  Status:  Discontinued     100 mg Oral Every 12 hours  10/06/18 0726 10/11/18 1328   10/03/18 1800  metroNIDAZOLE (FLAGYL) tablet 500 mg  Status:  Discontinued     500 mg Oral Every 8 hours 10/03/18 1054 10/15/18 1447   10/03/18 0700  vancomycin (VANCOCIN) 2,000 mg in sodium chloride 0.9 % 500 mL IVPB  Status:  Discontinued     2,000 mg 250 mL/hr over 120 Minutes Intravenous Every 36 hours 10/01/18 1942 10/06/18 0726   10/02/18 0200  ceFEPIme (MAXIPIME) 2 g in sodium chloride 0.9 % 100 mL IVPB  Status:  Discontinued     2 g 200 mL/hr over 30 Minutes Intravenous Every 8 hours 10/01/18 1942 10/02/18 1008   10/01/18 1830  vancomycin (VANCOCIN) 2,500 mg in sodium chloride 0.9 % 500 mL IVPB     2,500 mg 250 mL/hr over 120 Minutes Intravenous STAT 10/01/18 1825 10/02/18 0126   10/01/18 1800  ceFEPIme (MAXIPIME) 2 g in sodium chloride 0.9 % 100 mL IVPB     2 g 200 mL/hr over 30 Minutes Intravenous  Once 10/01/18 1747 10/01/18 1903   10/01/18 1800  metroNIDAZOLE (FLAGYL) IVPB 500 mg  Status:  Discontinued     500 mg 100 mL/hr over 60 Minutes Intravenous Every 8 hours 10/01/18 1747 10/03/18 1054   10/01/18 1800  vancomycin (VANCOCIN) IVPB 1000 mg/200 mL premix  Status:  Discontinued     1,000 mg 200 mL/hr over 60 Minutes Intravenous  Once 10/01/18 1747 10/01/18 1825      Procedures: None  CONSULTS:  None  Time spent: 25- minutes-Greater than 50% of this time was spent in counseling, explanation of diagnosis, planning of further management, and coordination of care.  MEDICATIONS: Scheduled Meds: . apixaban  5 mg Oral BID  . cycloSPORINE  1 drop Both Eyes BID  . diltiazem  180 mg Oral Daily  . escitalopram  10 mg Oral Daily  . gabapentin  100 mg Oral TID  . lidocaine  1 patch Transdermal Q24H  . Melatonin  6 mg Oral QHS  . midodrine  10 mg Oral TID WC  . multivitamin with minerals  1 tablet Oral Daily  . pantoprazole  40 mg Oral Daily  . ENSURE MAX PROTEIN  11 oz Oral BID  . senna-docusate  1 tablet Oral BID  . spironolactone  50 mg  Oral Daily  . thiamine  100 mg Oral Daily   Continuous Infusions: . sodium chloride 10 mL/hr at 10/20/18 0655  . ertapenem Stopped (10/19/18 1609)   PRN Meds:.sodium chloride, acetaminophen, albuterol, alum & mag hydroxide-simeth, diltiazem, hydrOXYzine, menthol-cetylpyridinium, [DISCONTINUED] ondansetron **OR** ondansetron (ZOFRAN) IV, oxyCODONE, polyethylene glycol, sodium chloride flush, traZODone   PHYSICAL EXAM: Vital signs: Vitals:   10/19/18 2200 10/20/18 0439 10/20/18 0439 10/20/18 0929  BP: 106/64  117/78 117/74  Pulse: (!) 107  94   Resp:   18   Temp:   97.8 F (36.6 C)   TempSrc:   Oral   SpO2: 97%  92%   Weight:  122 kg    Height:       Filed Weights   10/17/18 1400 10/19/18 0500 10/20/18 0439  Weight: 120.5 kg 123.4 kg 122 kg   Body mass index is 40.9 kg/m.   General appearance:Awake, alert, not in any distress.  Eyes:no scleral icterus. HEENT: Atraumatic and Normocephalic Neck:  supple, no JVD. Resp:Good air entry bilaterally,no rales or rhonchi CVS: S1 S2 regular, no murmurs.  GI: Bowel sounds present, Non tender and not distended with no gaurding, rigidity or rebound. Extremities: B/L Lower Ext shows no edema, both legs are warm to touch Neurology:  Non focal Musculoskeletal:No digital cyanosis Skin:No Rash, warm and dry Wounds:N/A  LABORATORY DATA: CBC: Recent Labs  Lab 10/15/18 0402 10/17/18 0341 10/18/18 0402 10/19/18 0407 10/20/18 0410  WBC 8.5 8.8 8.2 7.1 7.1  NEUTROABS 5.7 6.7 5.6 4.4 4.2  HGB 11.8* 11.4* 10.9* 10.1* 10.2*  HCT 37.8* 35.7* 34.3* 33.7* 34.1*  MCV 86.9 88.1 89.1 89.6 90.0  PLT 323 313 295 274 810    Basic Metabolic Panel: Recent Labs  Lab 10/15/18 0402 10/17/18 0341 10/18/18 0402 10/19/18 0407 10/20/18 0410  NA 133* 134* 134* 135 136  K 4.6 4.6 4.8 4.7 4.5  CL 99 103 102 103 103  CO2 23 23 24 24 26   GLUCOSE 98 115* 102* 105* 117*  BUN 53* 41* 38* 38* 41*  CREATININE 1.17 0.94 0.97 0.95 0.94  CALCIUM 9.9  10.1 9.8 9.8 9.7  MG 2.4 2.1 2.1 2.0 2.1    GFR: Estimated Creatinine Clearance: 90.2 mL/min (by C-G formula based on SCr of 0.94 mg/dL).  Liver Function Tests: Recent Labs  Lab 10/18/18 0402  AST 17  ALT 12  ALKPHOS 45  BILITOT 0.6  PROT 6.3*  ALBUMIN 2.6*   No results for input(s): LIPASE, AMYLASE in the last 168 hours. No results for input(s): AMMONIA in the last 168 hours.  Coagulation Profile: No results for input(s): INR, PROTIME in the last 168 hours.  Cardiac Enzymes: No results for input(s): CKTOTAL, CKMB, CKMBINDEX, TROPONINI in the last 168 hours.  BNP (last 3 results) No results for input(s): PROBNP in the last 8760 hours.  HbA1C: No results for input(s): HGBA1C in the last 72 hours.  CBG: Recent Labs  Lab 10/16/18 1726 10/17/18 0812 10/18/18 0803 10/19/18 0758 10/20/18 0810  GLUCAP 109* 98 145* 85 81    Lipid Profile: No results for input(s): CHOL, HDL, LDLCALC, TRIG, CHOLHDL, LDLDIRECT in the last 72 hours.  Thyroid Function Tests: No results for input(s): TSH, T4TOTAL, FREET4, T3FREE, THYROIDAB in the last 72 hours.  Anemia Panel: No results for input(s): VITAMINB12, FOLATE, FERRITIN, TIBC, IRON, RETICCTPCT in the last 72 hours.  Urine analysis:    Component Value Date/Time   COLORURINE YELLOW 10/01/2018 1822   APPEARANCEUR CLEAR 10/01/2018 1822   APPEARANCEUR Clear 01/18/2018 1539   LABSPEC 1.010 10/01/2018 1822   PHURINE 5.0 10/01/2018 1822   GLUCOSEU NEGATIVE 10/01/2018 1822   HGBUR NEGATIVE 10/01/2018 1822   BILIRUBINUR NEGATIVE 10/01/2018 1822   BILIRUBINUR Negative 01/18/2018 1539   KETONESUR NEGATIVE 10/01/2018 1822   PROTEINUR NEGATIVE 10/01/2018 1822   UROBILINOGEN 0.2 09/06/2012 0213   NITRITE NEGATIVE 10/01/2018 1822   LEUKOCYTESUR NEGATIVE 10/01/2018 1822   LEUKOCYTESUR Negative 01/18/2018 1539    Sepsis Labs: Lactic Acid, Venous    Component Value Date/Time   LATICACIDVEN 1.7 10/01/2018 1833     MICROBIOLOGY: Recent Results (from the past 240 hour(s))  Body fluid culture     Status: None   Collection Time: 10/15/18 10:22 AM  Result Value Ref Range Status   Specimen Description SYNOVIAL LEFT HIP  Final   Special Requests NONE  Final   Gram Stain NO WBC SEEN NO ORGANISMS SEEN   Final   Culture   Final    NO  GROWTH 3 DAYS Performed at [facility], [place] [address], [place], [place] [address]    Report Status 10/18/2018 FINAL  Final    RADIOLOGY STUDIES/RESULTS: Dg Chest 1 View  Result Date: 10/01/2018 CLINICAL DATA:  Follow-up pneumothorax. EXAM: CHEST  1 VIEW COMPARISON:  09/09/2018 FINDINGS: Mild bilateral interstitial thickening. Small bilateral pleural effusions. Left lateral pleural thickening with a subtle lucency which may reflect a small loculated pneumothorax versus artifact. Right infrahilar hazy airspace disease likely reflecting atelectasis. Stable cardiomediastinal silhouette. No aggressive osseous lesion. Mild osteoarthritis of bilateral glenohumeral joints. IMPRESSION: Left lateral pleural thickening with a subtle lucency which may reflect a small loculated pneumothorax versus artifact. Recommend follow-up chest x-ray for re-evaluation. Bilateral interstitial thickening and small bilateral pleural effusions concerning for mild pulmonary edema. Electronically Signed   By: Kathreen Devoid   On: 10/01/2018 15:38   Dg Knee 1-2 Views Left  Result Date: 10/16/2018 CLINICAL DATA:  Pain after fall EXAM: LEFT KNEE - 1-2 VIEW COMPARISON:  None. FINDINGS: No acute fracture or dislocation. No joint effusion. Moderate to severe degenerative changes. IMPRESSION: Degenerative changes.  No acute fracture noted. Electronically Signed   By: Dorise Bullion III M.D   On: 10/16/2018 20:32   Dg Knee 1-2 Views Right  Result Date: 10/16/2018 CLINICAL DATA:  Pain after fall EXAM: RIGHT KNEE - 1-2 VIEW COMPARISON:  None. FINDINGS: No acute fracture or effusion. Moderate to severe  degenerative changes. IMPRESSION: No acute abnormality. Electronically Signed   By: Dorise Bullion III M.D   On: 10/16/2018 20:32   Ct Chest Wo Contrast  Result Date: 10/07/2018 CLINICAL DATA:  Chronic dyspnea. EXAM: CT CHEST WITHOUT CONTRAST TECHNIQUE: Multidetector CT imaging of the chest was performed following the standard protocol without IV contrast. COMPARISON:  Portable chest obtained earlier today and chest CTA dated 08/17/2018. FINDINGS: Cardiovascular: Atheromatous calcifications, including the coronary arteries and aorta. Stable borderline enlargement of the heart with mild biatrial enlargement. No pericardial effusion. Mediastinum/Nodes: No enlarged mediastinal or axillary lymph nodes. Thyroid gland, trachea, and esophagus demonstrate no significant findings. Lungs/Pleura: Small to moderate-sized right pleural effusion. Stable left pleural thickening with calcifications. Mild bilateral atelectasis and scarring. Upper Abdomen: Post gastric bypass changes. Musculoskeletal: Mild dextroconvex thoracolumbar scoliosis. Extensive thoracic and lower cervical spine degenerative changes. IMPRESSION: 1. Small to moderate-sized right pleural effusion. 2. Mild bilateral atelectasis and scarring. 3. 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 4.  Calcific coronary artery and aortic atherosclerosis. Aortic Atherosclerosis (ICD10-I70.0). Electronically Signed   By: Claudie Revering M.D.   On: 10/07/2018 20:31   Ct Hip Left W Contrast  Result Date: 10/01/2018 CLINICAL DATA:  Left hip pain EXAM: CT OF THE LOWER LEFT EXTREMITY WITH CONTRAST TECHNIQUE: Multidetector CT imaging of the lower left extremity was performed according to the standard protocol following intravenous contrast administration. COMPARISON:  09/03/2018 CONTRAST:  169mL OMNIPAQUE IOHEXOL 300 MG/ML  SOLN FINDINGS: Bones/Joint/Cartilage An uncemented left total hip arthroplasty is identified with the  tip traversing a left femoral diaphyseal fracture that is incompletely healed. Subtle residual fracture lucencies are suggested on the coronal reformats, series 6/46 through 48 for instance. There is callus formation developing about the fracture, series 3/143. Slight residual varus configuration about the fracture is seen of the femur. No new/acute appearing fracture or bone destruction is seen. With reference to the hip joint, no significant joint effusion or abnormal lucencies to suggest loosening. The prosthetic femoral head is seated within the acetabular component without  evidence of asymmetric lining wear. No soft tissue mass or mineralization. No hematoma or abnormal fluid collections. Osteoarthritis of the included SI joints and pubic symphysis. No diastasis. Ligaments Suboptimally assessed by CT. Muscles and Tendons Negative for muscle edema, hematoma or definite tear. Soft tissues No soft tissue mass, mineralization or fluid collections. Postop scarring along the lateral aspect of the hip. IMPRESSION: 1. An uncemented left total hip arthroplasty is identified with the tip traversing a left femoral diaphyseal fracture that is incompletely healed. Subtle residual fracture lucencies are suggested on the coronal reformats, series 6/46 through 48. Slight residual varus configuration of the femur about the fracture is noted. 2. No new/acute appearing fracture or bone destruction is seen. 3. No significant joint effusion to suggest septic arthritis though this is of clinical concern, percutaneous sampling of the joint fluid could be attempted. No definite evidence of hardware loosening however. Electronically Signed   By: Ashley Royalty M.D.   On: 10/01/2018 20:08   Dg Chest Port 1 View  Result Date: 10/16/2018 CLINICAL DATA:  PICC line placement. EXAM: PORTABLE CHEST 1 VIEW COMPARISON:  Radiographs and CT 10/07/2018. FINDINGS: 1223 hours. Left arm PICC tip is not optimally visualized, although appears to  extend to the lower SVC level. The heart size and mediastinal contours are stable. There is aortic atherosclerosis. Bilateral pleural thickening and subpleural rounded atelectasis peripherally in the left lung are unchanged. There is no pneumothorax. Glenohumeral degenerative changes are present bilaterally. IMPRESSION: PICC line tip at the lower SVC level. No pneumothorax or acute findings. Electronically Signed   By: Richardean Sale M.D.   On: 10/16/2018 13:03   Dg Chest Port 1 View  Result Date: 10/07/2018 CLINICAL DATA:  Shortness of breath. EXAM: PORTABLE CHEST 1 VIEW COMPARISON:  10/01/2017. FINDINGS: Left lower chest incompletely imaged. Cardiomegaly with diffuse bilateral interstitial prominence and bilateral pleural effusions. Findings have progressed from prior exam are most consistent with CHF. A component of pleural scarring may be present. IMPRESSION: Congestive heart failure with bilateral interstitial edema. Bibasilar pneumonia can not be excluded. Bilateral pleural effusions. A component of pleural scarring may be present. Electronically Signed   By: Marcello Moores  Register   On: 10/07/2018 08:58   Dg Chest Port 1 View  Result Date: 10/01/2018 CLINICAL DATA:  LEFT hip replacement 6 months ago, sepsis, question LEFT leg/hip infection EXAM: PORTABLE CHEST 1 VIEW COMPARISON:  Portable exam 1756 hours compared to 10/01/2018 FINDINGS: RIGHT costophrenic angle excluded. Enlargement of cardiac silhouette. Mediastinal contours and pulmonary vascularity normal. Atherosclerotic calcification aorta. Small LEFT pleural effusion and basilar atelectasis at lower LEFT chest question partially loculated. No definite infiltrate or pneumothorax. Bones demineralized. IMPRESSION: LEFT pleural effusion and basilar atelectasis, question partially locule at the lower lateral LEFT chest. Electronically Signed   By: Lavonia Dana M.D.   On: 10/01/2018 18:05   Dg Foot 2 Views Left  Result Date: 10/17/2018 CLINICAL DATA:   Pain in left foot. EXAM: LEFT FOOT - 2 VIEW COMPARISON:  None. FINDINGS: Osteopenia. No fractures in the toes or metacarpals. Enthesopathic change at the posterior calcaneus. There is a lucency along the inferior aspect of the calcaneus which does not appear to extend through the calcaneus and is relatively well corticated. No other bony abnormalities. IMPRESSION: 1. There is a lucency along the inferior aspect of the calcaneus which is relatively well corticated and does not appear to extend through the calcaneus. This is favored to be nonacute. Recommend clinical correlation to exclude pain in this region.  Electronically Signed   By: Dorise Bullion III M.D   On: 10/17/2018 15:05   Dg Fluoro Guided Needle Plc Aspiration/injection Loc  Result Date: 10/15/2018 CLINICAL DATA:  Left hip pain, evaluate for fluid collection EXAM: LEFT HIP ASPIRATION UNDER FLUOROSCOPY FLUOROSCOPY TIME:  Fluoroscopy Time:  18 seconds Radiation Exposure Index (if provided by the fluoroscopic device): 2.3 mGy Number of Acquired Spot Images: 1 PROCEDURE: Overlying skin prepped with Betadine, draped in the usual sterile fashion, and infiltrated locally with buffered Lidocaine. 20 gauge spinal needle advanced to the prosthetic junction of the femoral head/neck. 1 ml of Lidocaine injected easily. No fluid/frank infection could be aspirated. 4 mL Lidocaine and 6 mL sterile saline administered into the joint with attempted subsequent aspiration. Less than 1 mL fluid was aspirated and sent for laboratory evaluation. No immediate complication. IMPRESSION: Technically successful left hip aspiration under fluoroscopy, as described above. Electronically Signed   By: Julian Hy M.D.   On: 10/15/2018 11:18   Dg Hips Bilat With Pelvis 2v  Result Date: 10/16/2018 CLINICAL DATA:  Fall.  Pain. EXAM: DG HIP (WITH OR WITHOUT PELVIS) 2V BILAT COMPARISON:  None. FINDINGS: The patient is status post bilateral hip replacements. Visualized hardware  is in good position. No acute fractures. IMPRESSION: Bilateral hip replacements.  No acute fractures. Electronically Signed   By: Dorise Bullion III M.D   On: 10/16/2018 20:30   [place] Ekg Site Rite  Result Date: 10/15/2018 If Site Rite image not attached, placement could not be confirmed due to current cardiac rhythm.    LOS: 16 days   Oren Binet, MD  [facility]  If 7PM-7AM, please contact night-coverage  Please page via [email or web address] [email or web address]-click on MD name and type text message  10/20/2018, 12:08 PM

## 2018-10-20 NOTE — Progress Notes (Signed)
Patient found with home CPAP in use 

## 2018-10-21 LAB — BASIC METABOLIC PANEL
Anion gap: 9 (ref 5–15)
BUN: 38 mg/dL — ABNORMAL HIGH (ref 8–23)
CO2: 20 mmol/L — ABNORMAL LOW (ref 22–32)
Calcium: 9.5 mg/dL (ref 8.9–10.3)
Chloride: 105 mmol/L (ref 98–111)
Creatinine, Ser: 1.06 mg/dL (ref 0.61–1.24)
GFR calc Af Amer: >60 mL/min (ref >60)
GFR calc non Af Amer: >60 mL/min (ref >60)
Glucose, Bld: 84 mg/dL (ref 70–99)
Potassium: 5.1 mmol/L (ref 3.5–5.1)
Sodium: 134 mmol/L — ABNORMAL LOW (ref 135–145)

## 2018-10-21 LAB — GLUCOSE, CAPILLARY: Glucose-Capillary: 94 mg/dL (ref 70–99)

## 2018-10-21 LAB — MAGNESIUM: Magnesium: 2 mg/dL (ref 1.7–2.4)

## 2018-10-21 NOTE — Progress Notes (Signed)
Patient's family requested to speak to CSW in the room. They requested an update on discharge plan. CSW explained that patient would be required to pay out of pocket in order to go to SNF. Patient stated that he is getting a settlement of $63,000 in a few months if that would help [facility] to accept him. 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 CSW requested a copy of the letter to send to [place].   Frank Locus Diaz Crago LCSW [phone]

## 2018-10-21 NOTE — Progress Notes (Signed)
PROGRESS NOTE        PATIENT DETAILS Name: [name] Age: 73 y.o. Sex: male Date of Birth: 07/01/1946 Admit Date: 10/01/2018 Admitting Physician Vianne Bulls, MD JJH:ERDEYC, Hollice Espy, MD  Brief Narrative: Patient is a 73 y.o. male with history of A. 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  Found to have a soft tissue infection of the left hip area, acute kidney injury and hyperkalemia.  ID, orthopedics consulted-underwent left hip aspiration on 2/14, infectious disease recommending 6 weeks of IV Invanz.   See below for further details  Subjective: No major events overnight-lying comfortably in bed.  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  Was on suppressive Flagyl for Bacteroides fragilis infection-recommendations from ID are for 6 weeks of IV ertapenem-end date of November 25, 2018.  AKI: Likely hemodynamically mediated-secondary to soft tissue infection of the left hip, diuretic use.  Resolved with supportive care.  Follow electrolytes periodically.  Hyperkalemia: Suspect secondary to AKI with use of Aldactone and potassium supplementation.  Resolved.  History of left total hip arthroplasty with subsequent infection with Bacteroides fragilis: Patient was evaluated by infectious disease at [facility] was previously on  Flagyl.  Now on IV Invanz-Flagyl has been discontinued.  Recurrent pleural effusion: Felt to be secondary to hepatic hydrothorax-most recent CT chest on 10/07/2018 shows a small pleural effusion-patient is asymptomatic at this point.  Doubt further work-up required-furthermore he has had a recent extensive work-up including multiple thoracocentesis   Cryptogenic cirrhosis: Prior hepatitis serology done earlier this month was negative-agree with plans outlined in prior discharge summary for outpatient follow-up.  Volume status is stable-he is awake and alert-continue Aldactone  Atrial fibrillation: Rate controlled with Cardizem.Continue Eliquis.  Chronic back and right shoulder pain: Continue Lidoderm patch, Tylenol and as needed narcotics.  Bipolar disorder: Stable-continue Lexapro, Neurontin  OSA: Continue CPAP nightly  Morbid obesity  Acute on chronic debility/deconditioning: Has chronic debility/deconditioning at baseline-this is worsened due to acute illness/AKI/left hip infection.  Per social work-very difficult to place to SNF.  DVT Prophylaxis: Full dose anticoagulation with Eliquis  Code Status: Full code  Family Communication: None at bedside  Disposition Plan: Remain inpatient- SNF on discharge when bed available  Antimicrobial agents: Anti-infectives (From admission, onward)   Start     Dose/Rate Route Frequency Ordered Stop   10/15/18 1500  cefTRIAXone (ROCEPHIN) 2 g in sodium chloride 0.9 % 100 mL IVPB  Status:  Discontinued     2 g 200 mL/hr over 30 Minutes Intravenous Every 24 hours 10/15/18 1434 10/15/18 1447   10/15/18 1500  ertapenem (INVANZ) 1,000 mg in sodium chloride 0.9 % 100 mL IVPB     1 g 200 mL/hr over 30 Minutes Intravenous Every 24 hours 10/15/18 1447 11/25/18 2359   10/08/18 0000  doxycycline (VIBRA-TABS) 100 MG tablet  100 mg Oral Every 12 hours 10/08/18 0921     10/06/18 1000  doxycycline (VIBRA-TABS) tablet 100 mg  Status:  Discontinued      100 mg Oral Every 12 hours 10/06/18 0726 10/11/18 1328   10/03/18 1800  metroNIDAZOLE (FLAGYL) tablet 500 mg  Status:  Discontinued     500 mg Oral Every 8 hours 10/03/18 1054 10/15/18 1447   10/03/18 0700  vancomycin (VANCOCIN) 2,000 mg in sodium chloride 0.9 % 500 mL IVPB  Status:  Discontinued     2,000 mg 250 mL/hr over 120 Minutes Intravenous Every 36 hours 10/01/18 1942 10/06/18 0726   10/02/18 0200  ceFEPIme (MAXIPIME) 2 g in sodium chloride 0.9 % 100 mL IVPB  Status:  Discontinued     2 g 200 mL/hr over 30 Minutes Intravenous Every 8 hours 10/01/18 1942 10/02/18 1008   10/01/18 1830  vancomycin (VANCOCIN) 2,500 mg in sodium chloride 0.9 % 500 mL IVPB     2,500 mg 250 mL/hr over 120 Minutes Intravenous STAT 10/01/18 1825 10/02/18 0126   10/01/18 1800  ceFEPIme (MAXIPIME) 2 g in sodium chloride 0.9 % 100 mL IVPB     2 g 200 mL/hr over 30 Minutes Intravenous  Once 10/01/18 1747 10/01/18 1903   10/01/18 1800  metroNIDAZOLE (FLAGYL) IVPB 500 mg  Status:  Discontinued     500 mg 100 mL/hr over 60 Minutes Intravenous Every 8 hours 10/01/18 1747 10/03/18 1054   10/01/18 1800  vancomycin (VANCOCIN) IVPB 1000 mg/200 mL premix  Status:  Discontinued     1,000 mg 200 mL/hr over 60 Minutes Intravenous  Once 10/01/18 1747 10/01/18 1825      Procedures: None  CONSULTS:  None  Time spent: 25- minutes-Greater than 50% of this time was spent in counseling, explanation of diagnosis, planning of further management, and coordination of care.  MEDICATIONS: Scheduled Meds: . apixaban  5 mg Oral BID  . cycloSPORINE  1 drop Both Eyes BID  . diltiazem  180 mg Oral Daily  . escitalopram  10 mg Oral Daily  . gabapentin  100 mg Oral TID  . lidocaine  1 patch Transdermal Q24H  . Melatonin  6 mg Oral QHS  . midodrine  10 mg Oral TID WC  . multivitamin with minerals  1 tablet Oral Daily  . pantoprazole  40 mg Oral Daily  . ENSURE MAX PROTEIN  11 oz Oral BID  . senna-docusate  1 tablet Oral BID    . spironolactone  50 mg Oral Daily  . thiamine  100 mg Oral Daily   Continuous Infusions: . sodium chloride 10 mL/hr at 10/21/18 0700  . ertapenem 1,000 mg (10/20/18 1432)   PRN Meds:.sodium chloride, acetaminophen, albuterol, alum & mag hydroxide-simeth, diltiazem, hydrOXYzine, menthol-cetylpyridinium, [DISCONTINUED] ondansetron **OR** ondansetron (ZOFRAN) IV, oxyCODONE, polyethylene glycol, sodium chloride flush, traZODone   PHYSICAL EXAM: Vital signs: Vitals:   10/20/18 1641 10/20/18 2127 10/21/18 0500 10/21/18 0544  BP: 103/64 107/81  98/63  Pulse: 91 95  97  Resp: 20 18  18   Temp: (!) 97.4 F (36.3 C) (!) 97.5 F (36.4 C)  97.6 F (36.4 C)  TempSrc:      SpO2: 100%   94%  Weight:   122.8 kg   Height:       Filed Weights   10/19/18 0500 10/20/18 0439 10/21/18 0500  Weight: 123.4 kg 122 kg 122.8 kg   Body mass index is 41.16 kg/m.   General appearance:Awake, alert, not in  any distress.  Eyes:no scleral icterus. HEENT: Atraumatic and Normocephalic Neck: supple, no JVD. Resp:Good air entry bilaterally,no rales or rhonchi CVS: S1 S2 regular, no murmurs.  GI: Bowel sounds present, Non tender and not distended with no gaurding, rigidity or rebound. Extremities: B/L Lower Ext shows no edema, both legs are warm to touch Neurology:  Non focal Musculoskeletal:No digital cyanosis Skin:No Rash, warm and dry Wounds:N/A  LABORATORY DATA: CBC: Recent Labs  Lab 10/15/18 0402 10/17/18 0341 10/18/18 0402 10/19/18 0407 10/20/18 0410  WBC 8.5 8.8 8.2 7.1 7.1  NEUTROABS 5.7 6.7 5.6 4.4 4.2  HGB 11.8* 11.4* 10.9* 10.1* 10.2*  HCT 37.8* 35.7* 34.3* 33.7* 34.1*  MCV 86.9 88.1 89.1 89.6 90.0  PLT 323 313 295 274 875    Basic Metabolic Panel: Recent Labs  Lab 10/17/18 0341 10/18/18 0402 10/19/18 0407 10/20/18 0410 10/21/18 0319  NA 134* 134* 135 136 134*  K 4.6 4.8 4.7 4.5 5.1  CL 103 102 103 103 105  CO2 23 24 24 26  20*  GLUCOSE 115* 102* 105* 117* 84  BUN 41*  38* 38* 41* 38*  CREATININE 0.94 0.97 0.95 0.94 1.06  CALCIUM 10.1 9.8 9.8 9.7 9.5  MG 2.1 2.1 2.0 2.1 2.0    GFR: Estimated Creatinine Clearance: 80.4 mL/min (by C-G formula based on SCr of 1.06 mg/dL).  Liver Function Tests: Recent Labs  Lab 10/18/18 0402  AST 17  ALT 12  ALKPHOS 45  BILITOT 0.6  PROT 6.3*  ALBUMIN 2.6*   No results for input(s): LIPASE, AMYLASE in the last 168 hours. No results for input(s): AMMONIA in the last 168 hours.  Coagulation Profile: No results for input(s): INR, PROTIME in the last 168 hours.  Cardiac Enzymes: No results for input(s): CKTOTAL, CKMB, CKMBINDEX, TROPONINI in the last 168 hours.  BNP (last 3 results) No results for input(s): PROBNP in the last 8760 hours.  HbA1C: No results for input(s): HGBA1C in the last 72 hours.  CBG: Recent Labs  Lab 10/17/18 0812 10/18/18 0803 10/19/18 0758 10/20/18 0810 10/21/18 0816  GLUCAP 98 145* 85 81 94    Lipid Profile: No results for input(s): CHOL, HDL, LDLCALC, TRIG, CHOLHDL, LDLDIRECT in the last 72 hours.  Thyroid Function Tests: No results for input(s): TSH, T4TOTAL, FREET4, T3FREE, THYROIDAB in the last 72 hours.  Anemia Panel: No results for input(s): VITAMINB12, FOLATE, FERRITIN, TIBC, IRON, RETICCTPCT in the last 72 hours.  Urine analysis:    Component Value Date/Time   COLORURINE YELLOW 10/01/2018 1822   APPEARANCEUR CLEAR 10/01/2018 1822   APPEARANCEUR Clear 01/18/2018 1539   LABSPEC 1.010 10/01/2018 1822   PHURINE 5.0 10/01/2018 1822   GLUCOSEU NEGATIVE 10/01/2018 1822   HGBUR NEGATIVE 10/01/2018 1822   BILIRUBINUR NEGATIVE 10/01/2018 1822   BILIRUBINUR Negative 01/18/2018 1539   KETONESUR NEGATIVE 10/01/2018 1822   PROTEINUR NEGATIVE 10/01/2018 1822   UROBILINOGEN 0.2 09/06/2012 0213   NITRITE NEGATIVE 10/01/2018 1822   LEUKOCYTESUR NEGATIVE 10/01/2018 1822   LEUKOCYTESUR Negative 01/18/2018 1539    Sepsis Labs: Lactic Acid, Venous    Component Value  Date/Time   LATICACIDVEN 1.7 10/01/2018 1833    MICROBIOLOGY: Recent Results (from the past 240 hour(s))  Body fluid culture     Status: None   Collection Time: 10/15/18 10:22 AM  Result Value Ref Range Status   Specimen Description SYNOVIAL LEFT HIP  Final   Special Requests NONE  Final   Gram Stain NO WBC SEEN NO ORGANISMS SEEN  Final   Culture   Final    NO GROWTH 3 DAYS Performed at [facility], [place] [address], [place], [place] [address]    Report Status 10/18/2018 FINAL  Final    RADIOLOGY STUDIES/RESULTS: Dg Chest 1 View  Result Date: 10/01/2018 CLINICAL DATA:  Follow-up pneumothorax. EXAM: CHEST  1 VIEW COMPARISON:  09/09/2018 FINDINGS: Mild bilateral interstitial thickening. Small bilateral pleural effusions. Left lateral pleural thickening with a subtle lucency which may reflect a small loculated pneumothorax versus artifact. Right infrahilar hazy airspace disease likely reflecting atelectasis. Stable cardiomediastinal silhouette. No aggressive osseous lesion. Mild osteoarthritis of bilateral glenohumeral joints. IMPRESSION: Left lateral pleural thickening with a subtle lucency which may reflect a small loculated pneumothorax versus artifact. Recommend follow-up chest x-ray for re-evaluation. Bilateral interstitial thickening and small bilateral pleural effusions concerning for mild pulmonary edema. Electronically Signed   By: Kathreen Devoid   On: 10/01/2018 15:38   Dg Knee 1-2 Views Left  Result Date: 10/16/2018 CLINICAL DATA:  Pain after fall EXAM: LEFT KNEE - 1-2 VIEW COMPARISON:  None. FINDINGS: No acute fracture or dislocation. No joint effusion. Moderate to severe degenerative changes. IMPRESSION: Degenerative changes.  No acute fracture noted. Electronically Signed   By: Dorise Bullion III M.D   On: 10/16/2018 20:32   Dg Knee 1-2 Views Right  Result Date: 10/16/2018 CLINICAL DATA:  Pain after fall EXAM: RIGHT KNEE - 1-2 VIEW COMPARISON:  None. FINDINGS: No acute  fracture or effusion. Moderate to severe degenerative changes. IMPRESSION: No acute abnormality. Electronically Signed   By: Dorise Bullion III M.D   On: 10/16/2018 20:32   Ct Chest Wo Contrast  Result Date: 10/07/2018 CLINICAL DATA:  Chronic dyspnea. EXAM: CT CHEST WITHOUT CONTRAST TECHNIQUE: Multidetector CT imaging of the chest was performed following the standard protocol without IV contrast. COMPARISON:  Portable chest obtained earlier today and chest CTA dated 08/17/2018. FINDINGS: Cardiovascular: Atheromatous calcifications, including the coronary arteries and aorta. Stable borderline enlargement of the heart with mild biatrial enlargement. No pericardial effusion. Mediastinum/Nodes: No enlarged mediastinal or axillary lymph nodes. Thyroid gland, trachea, and esophagus demonstrate no significant findings. Lungs/Pleura: Small to moderate-sized right pleural effusion. Stable left pleural thickening with calcifications. Mild bilateral atelectasis and scarring. Upper Abdomen: Post gastric bypass changes. Musculoskeletal: Mild dextroconvex thoracolumbar scoliosis. Extensive thoracic and lower cervical spine degenerative changes. IMPRESSION: 1. Small to moderate-sized right pleural effusion. 2. Mild bilateral atelectasis and scarring. 3. 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 4.  Calcific coronary artery and aortic atherosclerosis. Aortic Atherosclerosis (ICD10-I70.0). Electronically Signed   By: Claudie Revering M.D.   On: 10/07/2018 20:31   Ct Hip Left W Contrast  Result Date: 10/01/2018 CLINICAL DATA:  Left hip pain EXAM: CT OF THE LOWER LEFT EXTREMITY WITH CONTRAST TECHNIQUE: Multidetector CT imaging of the lower left extremity was performed according to the standard protocol following intravenous contrast administration. COMPARISON:  09/03/2018 CONTRAST:  136mL OMNIPAQUE IOHEXOL 300 MG/ML  SOLN FINDINGS: Bones/Joint/Cartilage An uncemented left total  hip arthroplasty is identified with the tip traversing a left femoral diaphyseal fracture that is incompletely healed. Subtle residual fracture lucencies are suggested on the coronal reformats, series 6/46 through 48 for instance. There is callus formation developing about the fracture, series 3/143. Slight residual varus configuration about the fracture is seen of the femur. No new/acute appearing fracture or bone destruction is seen. With reference to the hip joint, no significant joint effusion or abnormal lucencies to suggest loosening.  The prosthetic femoral head is seated within the acetabular component without evidence of asymmetric lining wear. No soft tissue mass or mineralization. No hematoma or abnormal fluid collections. Osteoarthritis of the included SI joints and pubic symphysis. No diastasis. Ligaments Suboptimally assessed by CT. Muscles and Tendons Negative for muscle edema, hematoma or definite tear. Soft tissues No soft tissue mass, mineralization or fluid collections. Postop scarring along the lateral aspect of the hip. IMPRESSION: 1. An uncemented left total hip arthroplasty is identified with the tip traversing a left femoral diaphyseal fracture that is incompletely healed. Subtle residual fracture lucencies are suggested on the coronal reformats, series 6/46 through 48. Slight residual varus configuration of the femur about the fracture is noted. 2. No new/acute appearing fracture or bone destruction is seen. 3. No significant joint effusion to suggest septic arthritis though this is of clinical concern, percutaneous sampling of the joint fluid could be attempted. No definite evidence of hardware loosening however. Electronically Signed   By: Ashley Royalty M.D.   On: 10/01/2018 20:08   Dg Chest Port 1 View  Result Date: 10/16/2018 CLINICAL DATA:  PICC line placement. EXAM: PORTABLE CHEST 1 VIEW COMPARISON:  Radiographs and CT 10/07/2018. FINDINGS: 1223 hours. Left arm PICC tip is not  optimally visualized, although appears to extend to the lower SVC level. The heart size and mediastinal contours are stable. There is aortic atherosclerosis. Bilateral pleural thickening and subpleural rounded atelectasis peripherally in the left lung are unchanged. There is no pneumothorax. Glenohumeral degenerative changes are present bilaterally. IMPRESSION: PICC line tip at the lower SVC level. No pneumothorax or acute findings. Electronically Signed   By: Richardean Sale M.D.   On: 10/16/2018 13:03   Dg Chest Port 1 View  Result Date: 10/07/2018 CLINICAL DATA:  Shortness of breath. EXAM: PORTABLE CHEST 1 VIEW COMPARISON:  10/01/2017. FINDINGS: Left lower chest incompletely imaged. Cardiomegaly with diffuse bilateral interstitial prominence and bilateral pleural effusions. Findings have progressed from prior exam are most consistent with CHF. A component of pleural scarring may be present. IMPRESSION: Congestive heart failure with bilateral interstitial edema. Bibasilar pneumonia can not be excluded. Bilateral pleural effusions. A component of pleural scarring may be present. Electronically Signed   By: Marcello Moores  Register   On: 10/07/2018 08:58   Dg Chest Port 1 View  Result Date: 10/01/2018 CLINICAL DATA:  LEFT hip replacement 6 months ago, sepsis, question LEFT leg/hip infection EXAM: PORTABLE CHEST 1 VIEW COMPARISON:  Portable exam 1756 hours compared to 10/01/2018 FINDINGS: RIGHT costophrenic angle excluded. Enlargement of cardiac silhouette. Mediastinal contours and pulmonary vascularity normal. Atherosclerotic calcification aorta. Small LEFT pleural effusion and basilar atelectasis at lower LEFT chest question partially loculated. No definite infiltrate or pneumothorax. Bones demineralized. IMPRESSION: LEFT pleural effusion and basilar atelectasis, question partially locule at the lower lateral LEFT chest. Electronically Signed   By: Lavonia Dana M.D.   On: 10/01/2018 18:05   Dg Foot 2 Views  Left  Result Date: 10/17/2018 CLINICAL DATA:  Pain in left foot. EXAM: LEFT FOOT - 2 VIEW COMPARISON:  None. FINDINGS: Osteopenia. No fractures in the toes or metacarpals. Enthesopathic change at the posterior calcaneus. There is a lucency along the inferior aspect of the calcaneus which does not appear to extend through the calcaneus and is relatively well corticated. No other bony abnormalities. IMPRESSION: 1. There is a lucency along the inferior aspect of the calcaneus which is relatively well corticated and does not appear to extend through the calcaneus. This is favored to  be nonacute. Recommend clinical correlation to exclude pain in this region. Electronically Signed   By: Dorise Bullion III M.D   On: 10/17/2018 15:05   Dg Fluoro Guided Needle Plc Aspiration/injection Loc  Result Date: 10/15/2018 CLINICAL DATA:  Left hip pain, evaluate for fluid collection EXAM: LEFT HIP ASPIRATION UNDER FLUOROSCOPY FLUOROSCOPY TIME:  Fluoroscopy Time:  18 seconds Radiation Exposure Index (if provided by the fluoroscopic device): 2.3 mGy Number of Acquired Spot Images: 1 PROCEDURE: Overlying skin prepped with Betadine, draped in the usual sterile fashion, and infiltrated locally with buffered Lidocaine. 20 gauge spinal needle advanced to the prosthetic junction of the femoral head/neck. 1 ml of Lidocaine injected easily. No fluid/frank infection could be aspirated. 4 mL Lidocaine and 6 mL sterile saline administered into the joint with attempted subsequent aspiration. Less than 1 mL fluid was aspirated and sent for laboratory evaluation. No immediate complication. IMPRESSION: Technically successful left hip aspiration under fluoroscopy, as described above. Electronically Signed   By: Julian Hy M.D.   On: 10/15/2018 11:18   Dg Hips Bilat With Pelvis 2v  Result Date: 10/16/2018 CLINICAL DATA:  Fall.  Pain. EXAM: DG HIP (WITH OR WITHOUT PELVIS) 2V BILAT COMPARISON:  None. FINDINGS: The patient is status post  bilateral hip replacements. Visualized hardware is in good position. No acute fractures. IMPRESSION: Bilateral hip replacements.  No acute fractures. Electronically Signed   By: Dorise Bullion III M.D   On: 10/16/2018 20:30   [place] Ekg Site Rite  Result Date: 10/15/2018 If Site Rite image not attached, placement could not be confirmed due to current cardiac rhythm.    LOS: 17 days   Oren Binet, MD  [facility]  If 7PM-7AM, please contact night-coverage  Please page via [email or web address]-Password [email or web address]-click on MD name and type text message  10/21/2018, 11:10 AM

## 2018-10-21 NOTE — Progress Notes (Signed)
Patient remains on Difficult to Place waitlist.   Cedric Fishman LCSW [phone]

## 2018-10-21 NOTE — Plan of Care (Signed)
Discussed with patient plan of care for the evening, pain management and bedtime medications with some teach back displayed 

## 2018-10-22 LAB — GLUCOSE, CAPILLARY: Glucose-Capillary: 89 mg/dL (ref 70–99)

## 2018-10-22 MED ORDER — FLEET ENEMA 7-19 GM/118ML RE ENEM
1.0000 | ENEMA | Freq: Once | RECTAL | Status: AC
  Administered 2018-10-22: 1 via RECTAL
  Filled 2018-10-22: qty 1

## 2018-10-22 NOTE — Progress Notes (Signed)
1 fleet enema administered with no relief. Warm prune juice with miralax given. Will monitor.

## 2018-10-22 NOTE — Progress Notes (Signed)
Patient has been complaining of constipation this morning. Offered PRN Miralax, in which patient refused stating it doesn't help him. Prune juice and coffee given with no relief. Patient up to [facility] with PT assistance and was still not able to go. Dr. Sloan Leiter made aware and verbal order received for a fleet enema. Will administer and monitor patient's response.

## 2018-10-22 NOTE — Progress Notes (Signed)
Physical Therapy Treatment Patient Details Name: [name] MRN: [id number] DOB: 02-14-1946 Today's Date: 10/22/2018    History of Present Illness Pt is a 73 year old male with PMHx significant for HTN, afib on Eliquis, OSA, morbid obesity, L THA and hip infection and history of multiple recent prolonged hospitalizations.  Pt presented to ED with generalized weakness, falls, and shortness of breath- work-up in the emergency room showed A. fib heart rate 108 and a recurrent right-sided pleural effusion    PT Comments    Little progress made.  Emphasis on there ex, transition to EOB, transfers to [facility] and ultimately from [facility] to recliner.   Follow Up Recommendations  SNF     Equipment Recommendations  None recommended by PT    Recommendations for Other Services       Precautions / Restrictions Precautions Precautions: Fall Restrictions Weight Bearing Restrictions: No    Mobility  Bed Mobility Overal bed mobility: Needs Assistance Bed Mobility: Supine to Sit     Supine to sit: Mod assist;+2 for physical assistance;Max assist     General bed mobility comments: Pt able to initiate movement of LEs to edge of bed he required assistance to move hips to edge of bed and to elevate trunk into sitting.  Pt reports dizziness after standing and returned to bed with max assistance for lifting LEs against gravity and for scooting to edge of bed.    Transfers Overall transfer level: Needs assistance Equipment used: Rolling walker (2 wheeled);None Transfers: Sit to/from Stand Sit to Stand: Mod assist;+2 physical assistance Stand pivot transfers: Mod assist;+2 physical assistance       General transfer comment: pt dictating how all assist will be given and otherwise not following commands  Ambulation/Gait             General Gait Details: transfers only   Stairs             Wheelchair Mobility    Modified Rankin (Stroke Patients Only)       Balance Overall  balance assessment: Needs assistance Sitting-balance support: Feet supported;Bilateral upper extremity supported Sitting balance-[name]: Fair Sitting balance - Comments: but preferred to have his UE assist   Standing balance support: Bilateral upper extremity supported;During functional activity Standing balance-[name]: Poor Standing balance comment: heavy reliance on the RW                            Cognition Arousal/Alertness: Awake/alert Behavior During Therapy: Anxious Overall Cognitive Status: Within Functional Limits for tasks assessed                                 General Comments: Pt is very particular and likes to take control in the tx.        Exercises General Exercises - Lower Extremity Ankle Circles/Pumps: 10 reps;AROM Quad Sets: AROM;Both;10 reps;Supine Heel Slides: AAROM;Both;10 reps Other Exercises Other Exercises: warm up hip/knee flex/ext ROM with graded resistance in extension bil x 7 reps    General Comments        Pertinent Vitals/Pain Pain Assessment: Faces Faces Pain Scale: Hurts little more Pain Location: back Pain Descriptors / Indicators: Grimacing;Guarding Pain Intervention(s): Limited activity within patient's tolerance    Home Living                      Prior Function  PT Goals (current goals can now be found in the care plan section) Acute Rehab PT Goals Patient Stated Goal: to get stronger and get home PT Goal Formulation: With patient Time For Goal Achievement: 10/30/18 Potential to Achieve Goals: Fair Progress towards PT goals: Not progressing toward goals - comment(minimal progression due to pt anxiety.)    Frequency    Min 2X/week      PT Plan Current plan remains appropriate    Co-evaluation              AM-PAC PT "6 Clicks" Mobility   Outcome Measure  Help needed turning from your back to your side while in a flat bed without using bedrails?: A  Lot Help needed moving from lying on your back to sitting on the side of a flat bed without using bedrails?: A Lot Help needed moving to and from a bed to a chair (including a wheelchair)?: A Lot Help needed standing up from a chair using your arms (e.g., wheelchair or bedside chair)?: A Lot Help needed to walk in hospital room?: Total Help needed climbing 3-5 steps with a railing? : Total 6 Click Score: 10    End of Session   Activity Tolerance: Other (comment);Patient tolerated treatment well(limited by anxiety) Patient left: in chair;with call bell/phone within reach;with chair alarm set;Other (comment)(lift pad) Nurse Communication: Mobility status PT Visit Diagnosis: Other abnormalities of gait and mobility (R26.89);Muscle weakness (generalized) (M62.81);Difficulty in walking, not elsewhere classified (R26.2);History of falling (Z91.81)     Time: 3300-7622 PT Time Calculation (min) (ACUTE ONLY): 45 min  Charges:  $Gait Training: 8-22 mins $Therapeutic Exercise: 8-22 mins $Therapeutic Activity: 8-22 mins                     10/22/2018  [name], PT Acute Rehabilitation Services [phone]  (pager) [phone]  (office)   [name] Mikah Rottinghaus 10/22/2018, 12:21 PM

## 2018-10-22 NOTE — Progress Notes (Signed)
Have educated the patient numerous times about importance of getting OOB daily. Patient continues to refuse ambulation. He states that the work he did with PT today just "did him in."

## 2018-10-22 NOTE — Progress Notes (Signed)
PROGRESS NOTE        PATIENT DETAILS Name: [name] Age: 73 y.o. Sex: male Date of Birth: [birth year] Admit Date: 10/01/2018 Admitting Physician Vianne Bulls, MD GEX:[id number], Hollice Espy, MD  Brief Narrative: Patient is a 73 y.o. male with history of A. 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  Found to have a soft tissue infection of the left hip area, acute kidney injury and hyperkalemia.  ID, orthopedics consulted-underwent left hip aspiration on 2/14, infectious disease recommending 6 weeks of IV Invanz.   See below for further details  Subjective: No major events overnight.  Lying comfortably in bed-denies any chest pain or shortness of breath.  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  Was on suppressive Flagyl for Bacteroides fragilis infection-recommendations from ID are for 6 weeks of IV ertapenem-end date of November 25, 2018.  AKI: Likely hemodynamically mediated-secondary to soft tissue infection of the left hip, diuretic use.  Resolved with supportive care.  Follow electrolytes periodically.  Hyperkalemia: Suspect secondary to AKI with use of Aldactone and potassium supplementation.  Resolved.  History of left total hip arthroplasty with subsequent infection with Bacteroides fragilis: Patient was evaluated by infectious disease at [facility] was previously on Flagyl.  Now on IV Invanz-Flagyl has been discontinued.  Recurrent pleural effusion: Felt to be secondary to hepatic hydrothorax-most recent CT chest on 10/07/2018 shows a small pleural effusion-patient is asymptomatic at this point.  Doubt further work-up required-furthermore he has had a recent extensive work-up including multiple thoracocentesis   Cryptogenic cirrhosis: Prior hepatitis serology done earlier this month was negative-agree with plans outlined in prior discharge summary for outpatient follow-up.  Volume status is stable-he is awake and alert-continue Aldactone  Atrial fibrillation: Rate controlled with Cardizem.Continue Eliquis.  Chronic back and right shoulder pain: Continue Lidoderm patch, Tylenol and as needed narcotics.  Bipolar disorder: Stable-continue Lexapro, Neurontin  OSA: Continue CPAP nightly  Morbid obesity  Acute on chronic debility/deconditioning: Has chronic debility/deconditioning at baseline-this is worsened due to acute illness/AKI/left hip infection.  Per social work-very difficult to place to SNF.  DVT Prophylaxis: Full dose anticoagulation with Eliquis  Code Status: Full code  Family Communication: None at bedside  Disposition Plan: Remain inpatient- SNF on discharge when bed available  Antimicrobial agents: Anti-infectives (From admission, onward)   Start     Dose/Rate Route Frequency Ordered Stop   10/15/18 1500  cefTRIAXone (ROCEPHIN) 2 g in sodium chloride 0.9 % 100 mL IVPB  Status:  Discontinued     2 g 200 mL/hr over 30 Minutes Intravenous Every 24 hours 10/15/18 1434 10/15/18 1447   10/15/18 1500  ertapenem (INVANZ) 1,000 mg in sodium chloride 0.9 % 100 mL IVPB     1 g 200 mL/hr over 30 Minutes Intravenous Every 24 hours 10/15/18 1447 11/25/18 2359   10/08/18 0000  doxycycline (VIBRA-TABS) 100 MG tablet     100 mg Oral Every 12 hours 10/08/18 0921     10/06/18 1000  doxycycline  (VIBRA-TABS) tablet 100 mg  Status:  Discontinued     100 mg Oral Every 12 hours 10/06/18 0726 10/11/18 1328   10/03/18 1800  metroNIDAZOLE (FLAGYL) tablet 500 mg  Status:  Discontinued     500 mg Oral Every 8 hours 10/03/18 1054 10/15/18 1447   10/03/18 0700  vancomycin (VANCOCIN) 2,000 mg in sodium chloride 0.9 % 500 mL IVPB  Status:  Discontinued     2,000 mg 250 mL/hr over 120 Minutes Intravenous Every 36 hours 10/01/18 1942 10/06/18 0726   10/02/18 0200  ceFEPIme (MAXIPIME) 2 g in sodium chloride 0.9 % 100 mL IVPB  Status:  Discontinued     2 g 200 mL/hr over 30 Minutes Intravenous Every 8 hours 10/01/18 1942 10/02/18 1008   10/01/18 1830  vancomycin (VANCOCIN) 2,500 mg in sodium chloride 0.9 % 500 mL IVPB     2,500 mg 250 mL/hr over 120 Minutes Intravenous STAT 10/01/18 1825 10/02/18 0126   10/01/18 1800  ceFEPIme (MAXIPIME) 2 g in sodium chloride 0.9 % 100 mL IVPB     2 g 200 mL/hr over 30 Minutes Intravenous  Once 10/01/18 1747 10/01/18 1903   10/01/18 1800  metroNIDAZOLE (FLAGYL) IVPB 500 mg  Status:  Discontinued     500 mg 100 mL/hr over 60 Minutes Intravenous Every 8 hours 10/01/18 1747 10/03/18 1054   10/01/18 1800  vancomycin (VANCOCIN) IVPB 1000 mg/200 mL premix  Status:  Discontinued     1,000 mg 200 mL/hr over 60 Minutes Intravenous  Once 10/01/18 1747 10/01/18 1825      Procedures: None  CONSULTS:  None  Time spent: 25- minutes-Greater than 50% of this time was spent in counseling, explanation of diagnosis, planning of further management, and coordination of care.  MEDICATIONS: Scheduled Meds: . apixaban  5 mg Oral BID  . cycloSPORINE  1 drop Both Eyes BID  . diltiazem  180 mg Oral Daily  . escitalopram  10 mg Oral Daily  . gabapentin  100 mg Oral TID  . lidocaine  1 patch Transdermal Q24H  . Melatonin  6 mg Oral QHS  . midodrine  10 mg Oral TID WC  . multivitamin with minerals  1 tablet Oral Daily  . pantoprazole  40 mg Oral Daily  . ENSURE MAX PROTEIN   11 oz Oral BID  . senna-docusate  1 tablet Oral BID  . spironolactone  50 mg Oral Daily  . thiamine  100 mg Oral Daily   Continuous Infusions: . sodium chloride 10 mL/hr at 10/22/18 0243  . ertapenem Stopped (10/21/18 1629)   PRN Meds:.sodium chloride, acetaminophen, albuterol, alum & mag hydroxide-simeth, diltiazem, hydrOXYzine, menthol-cetylpyridinium, [DISCONTINUED] ondansetron **OR** ondansetron (ZOFRAN) IV, oxyCODONE, polyethylene glycol, sodium chloride flush, traZODone   PHYSICAL EXAM: Vital signs: Vitals:   10/21/18 1434 10/21/18 2211 10/22/18 0500 10/22/18 0615  BP: (!) 97/53 100/64  106/71  Pulse: 95 96  88  Resp: 16 18  13   Temp: 98.7 F (37.1 C) 97.8 F (36.6 C)    TempSrc: Oral Oral    SpO2:  94%  96%  Weight:   122.7 kg   Height:       Filed Weights   10/20/18 0439 10/21/18 0500 10/22/18 0500  Weight: 122 kg 122.8 kg 122.7 kg   Body mass index is 41.13 kg/m.   General appearance:Awake,  alert, not in any distress.  Eyes:no scleral icterus. HEENT: Atraumatic and Normocephalic Neck: supple, no JVD. Resp:Good air entry bilaterally,no rales or rhonchi CVS: S1 S2 regular, no murmurs.  GI: Bowel sounds present, Non tender and not distended with no gaurding, rigidity or rebound. Extremities: B/L Lower Ext shows no edema, both legs are warm to touch Neurology:  Non focal Musculoskeletal:No digital cyanosis Skin:No Rash, warm and dry Wounds:N/A  LABORATORY DATA: CBC: Recent Labs  Lab 10/17/18 0341 10/18/18 0402 10/19/18 0407 10/20/18 0410  WBC 8.8 8.2 7.1 7.1  NEUTROABS 6.7 5.6 4.4 4.2  HGB 11.4* 10.9* 10.1* 10.2*  HCT 35.7* 34.3* 33.7* 34.1*  MCV 88.1 89.1 89.6 90.0  PLT 313 295 274 938    Basic Metabolic Panel: Recent Labs  Lab 10/17/18 0341 10/18/18 0402 10/19/18 0407 10/20/18 0410 10/21/18 0319  NA 134* 134* 135 136 134*  K 4.6 4.8 4.7 4.5 5.1  CL 103 102 103 103 105  CO2 23 24 24 26  20*  GLUCOSE 115* 102* 105* 117* 84  BUN 41* 38*  38* 41* 38*  CREATININE 0.94 0.97 0.95 0.94 1.06  CALCIUM 10.1 9.8 9.8 9.7 9.5  MG 2.1 2.1 2.0 2.1 2.0    GFR: Estimated Creatinine Clearance: 80.3 mL/min (by C-G formula based on SCr of 1.06 mg/dL).  Liver Function Tests: Recent Labs  Lab 10/18/18 0402  AST 17  ALT 12  ALKPHOS 45  BILITOT 0.6  PROT 6.3*  ALBUMIN 2.6*   No results for input(s): LIPASE, AMYLASE in the last 168 hours. No results for input(s): AMMONIA in the last 168 hours.  Coagulation Profile: No results for input(s): INR, PROTIME in the last 168 hours.  Cardiac Enzymes: No results for input(s): CKTOTAL, CKMB, CKMBINDEX, TROPONINI in the last 168 hours.  BNP (last 3 results) No results for input(s): PROBNP in the last 8760 hours.  HbA1C: No results for input(s): HGBA1C in the last 72 hours.  CBG: Recent Labs  Lab 10/18/18 0803 10/19/18 0758 10/20/18 0810 10/21/18 0816 10/22/18 0801  GLUCAP 145* 85 81 94 89    Lipid Profile: No results for input(s): CHOL, HDL, LDLCALC, TRIG, CHOLHDL, LDLDIRECT in the last 72 hours.  Thyroid Function Tests: No results for input(s): TSH, T4TOTAL, FREET4, T3FREE, THYROIDAB in the last 72 hours.  Anemia Panel: No results for input(s): VITAMINB12, FOLATE, FERRITIN, TIBC, IRON, RETICCTPCT in the last 72 hours.  Urine analysis:    Component Value Date/Time   COLORURINE YELLOW 10/01/2018 1822   APPEARANCEUR CLEAR 10/01/2018 1822   APPEARANCEUR Clear 01/18/2018 1539   LABSPEC 1.010 10/01/2018 1822   PHURINE 5.0 10/01/2018 1822   GLUCOSEU NEGATIVE 10/01/2018 1822   HGBUR NEGATIVE 10/01/2018 1822   BILIRUBINUR NEGATIVE 10/01/2018 1822   BILIRUBINUR Negative 01/18/2018 1539   KETONESUR NEGATIVE 10/01/2018 1822   PROTEINUR NEGATIVE 10/01/2018 1822   UROBILINOGEN 0.2 09/06/2012 0213   NITRITE NEGATIVE 10/01/2018 1822   LEUKOCYTESUR NEGATIVE 10/01/2018 1822   LEUKOCYTESUR Negative 01/18/2018 1539    Sepsis Labs: Lactic Acid, Venous    Component Value  Date/Time   LATICACIDVEN 1.7 10/01/2018 1833    MICROBIOLOGY: Recent Results (from the past 240 hour(s))  Body fluid culture     Status: None   Collection Time: 10/15/18 10:22 AM  Result Value Ref Range Status   Specimen Description SYNOVIAL LEFT HIP  Final   Special Requests NONE  Final   Gram Stain NO WBC SEEN NO ORGANISMS SEEN   Final   Culture  Final    NO GROWTH 3 DAYS Performed at [facility], [place] [address], [place], [place] [address]    Report Status 10/18/2018 FINAL  Final    RADIOLOGY STUDIES/RESULTS: Dg Chest 1 View  Result Date: 10/01/2018 CLINICAL DATA:  Follow-up pneumothorax. EXAM: CHEST  1 VIEW COMPARISON:  09/09/2018 FINDINGS: Mild bilateral interstitial thickening. Small bilateral pleural effusions. Left lateral pleural thickening with a subtle lucency which may reflect a small loculated pneumothorax versus artifact. Right infrahilar hazy airspace disease likely reflecting atelectasis. Stable cardiomediastinal silhouette. No aggressive osseous lesion. Mild osteoarthritis of bilateral glenohumeral joints. IMPRESSION: Left lateral pleural thickening with a subtle lucency which may reflect a small loculated pneumothorax versus artifact. Recommend follow-up chest x-ray for re-evaluation. Bilateral interstitial thickening and small bilateral pleural effusions concerning for mild pulmonary edema. Electronically Signed   By: Kathreen Devoid   On: 10/01/2018 15:38   Dg Knee 1-2 Views Left  Result Date: 10/16/2018 CLINICAL DATA:  Pain after fall EXAM: LEFT KNEE - 1-2 VIEW COMPARISON:  None. FINDINGS: No acute fracture or dislocation. No joint effusion. Moderate to severe degenerative changes. IMPRESSION: Degenerative changes.  No acute fracture noted. Electronically Signed   By: Dorise Bullion III M.D   On: 10/16/2018 20:32   Dg Knee 1-2 Views Right  Result Date: 10/16/2018 CLINICAL DATA:  Pain after fall EXAM: RIGHT KNEE - 1-2 VIEW COMPARISON:  None. FINDINGS: No acute  fracture or effusion. Moderate to severe degenerative changes. IMPRESSION: No acute abnormality. Electronically Signed   By: Dorise Bullion III M.D   On: 10/16/2018 20:32   Ct Chest Wo Contrast  Result Date: 10/07/2018 CLINICAL DATA:  Chronic dyspnea. EXAM: CT CHEST WITHOUT CONTRAST TECHNIQUE: Multidetector CT imaging of the chest was performed following the standard protocol without IV contrast. COMPARISON:  Portable chest obtained earlier today and chest CTA dated 08/17/2018. FINDINGS: Cardiovascular: Atheromatous calcifications, including the coronary arteries and aorta. Stable borderline enlargement of the heart with mild biatrial enlargement. No pericardial effusion. Mediastinum/Nodes: No enlarged mediastinal or axillary lymph nodes. Thyroid gland, trachea, and esophagus demonstrate no significant findings. Lungs/Pleura: Small to moderate-sized right pleural effusion. Stable left pleural thickening with calcifications. Mild bilateral atelectasis and scarring. Upper Abdomen: Post gastric bypass changes. Musculoskeletal: Mild dextroconvex thoracolumbar scoliosis. Extensive thoracic and lower cervical spine degenerative changes. IMPRESSION: 1. Small to moderate-sized right pleural effusion. 2. Mild bilateral atelectasis and scarring. 3. 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 4.  Calcific coronary artery and aortic atherosclerosis. Aortic Atherosclerosis (ICD10-I70.0). Electronically Signed   By: Claudie Revering M.D.   On: 10/07/2018 20:31   Ct Hip Left W Contrast  Result Date: 10/01/2018 CLINICAL DATA:  Left hip pain EXAM: CT OF THE LOWER LEFT EXTREMITY WITH CONTRAST TECHNIQUE: Multidetector CT imaging of the lower left extremity was performed according to the standard protocol following intravenous contrast administration. COMPARISON:  09/03/2018 CONTRAST:  167mL OMNIPAQUE IOHEXOL 300 MG/ML  SOLN FINDINGS: Bones/Joint/Cartilage An uncemented left total  hip arthroplasty is identified with the tip traversing a left femoral diaphyseal fracture that is incompletely healed. Subtle residual fracture lucencies are suggested on the coronal reformats, series 6/46 through 48 for instance. There is callus formation developing about the fracture, series 3/143. Slight residual varus configuration about the fracture is seen of the femur. No new/acute appearing fracture or bone destruction is seen. With reference to the hip joint, no significant joint effusion or abnormal lucencies to suggest loosening. The prosthetic femoral head is seated  within the acetabular component without evidence of asymmetric lining wear. No soft tissue mass or mineralization. No hematoma or abnormal fluid collections. Osteoarthritis of the included SI joints and pubic symphysis. No diastasis. Ligaments Suboptimally assessed by CT. Muscles and Tendons Negative for muscle edema, hematoma or definite tear. Soft tissues No soft tissue mass, mineralization or fluid collections. Postop scarring along the lateral aspect of the hip. IMPRESSION: 1. An uncemented left total hip arthroplasty is identified with the tip traversing a left femoral diaphyseal fracture that is incompletely healed. Subtle residual fracture lucencies are suggested on the coronal reformats, series 6/46 through 48. Slight residual varus configuration of the femur about the fracture is noted. 2. No new/acute appearing fracture or bone destruction is seen. 3. No significant joint effusion to suggest septic arthritis though this is of clinical concern, percutaneous sampling of the joint fluid could be attempted. No definite evidence of hardware loosening however. Electronically Signed   By: Ashley Royalty M.D.   On: 10/01/2018 20:08   Dg Chest Port 1 View  Result Date: 10/16/2018 CLINICAL DATA:  PICC line placement. EXAM: PORTABLE CHEST 1 VIEW COMPARISON:  Radiographs and CT 10/07/2018. FINDINGS: 1223 hours. Left arm PICC tip is not  optimally visualized, although appears to extend to the lower SVC level. The heart size and mediastinal contours are stable. There is aortic atherosclerosis. Bilateral pleural thickening and subpleural rounded atelectasis peripherally in the left lung are unchanged. There is no pneumothorax. Glenohumeral degenerative changes are present bilaterally. IMPRESSION: PICC line tip at the lower SVC level. No pneumothorax or acute findings. Electronically Signed   By: Richardean Sale M.D.   On: 10/16/2018 13:03   Dg Chest Port 1 View  Result Date: 10/07/2018 CLINICAL DATA:  Shortness of breath. EXAM: PORTABLE CHEST 1 VIEW COMPARISON:  10/01/2017. FINDINGS: Left lower chest incompletely imaged. Cardiomegaly with diffuse bilateral interstitial prominence and bilateral pleural effusions. Findings have progressed from prior exam are most consistent with CHF. A component of pleural scarring may be present. IMPRESSION: Congestive heart failure with bilateral interstitial edema. Bibasilar pneumonia can not be excluded. Bilateral pleural effusions. A component of pleural scarring may be present. Electronically Signed   By: Marcello Moores  Register   On: 10/07/2018 08:58   Dg Chest Port 1 View  Result Date: 10/01/2018 CLINICAL DATA:  LEFT hip replacement 6 months ago, sepsis, question LEFT leg/hip infection EXAM: PORTABLE CHEST 1 VIEW COMPARISON:  Portable exam 1756 hours compared to 10/01/2018 FINDINGS: RIGHT costophrenic angle excluded. Enlargement of cardiac silhouette. Mediastinal contours and pulmonary vascularity normal. Atherosclerotic calcification aorta. Small LEFT pleural effusion and basilar atelectasis at lower LEFT chest question partially loculated. No definite infiltrate or pneumothorax. Bones demineralized. IMPRESSION: LEFT pleural effusion and basilar atelectasis, question partially locule at the lower lateral LEFT chest. Electronically Signed   By: Lavonia Dana M.D.   On: 10/01/2018 18:05   Dg Foot 2 Views  Left  Result Date: 10/17/2018 CLINICAL DATA:  Pain in left foot. EXAM: LEFT FOOT - 2 VIEW COMPARISON:  None. FINDINGS: Osteopenia. No fractures in the toes or metacarpals. Enthesopathic change at the posterior calcaneus. There is a lucency along the inferior aspect of the calcaneus which does not appear to extend through the calcaneus and is relatively well corticated. No other bony abnormalities. IMPRESSION: 1. There is a lucency along the inferior aspect of the calcaneus which is relatively well corticated and does not appear to extend through the calcaneus. This is favored to be nonacute. Recommend clinical correlation to  exclude pain in this region. Electronically Signed   By: Dorise Bullion III M.D   On: 10/17/2018 15:05   Dg Fluoro Guided Needle Plc Aspiration/injection Loc  Result Date: 10/15/2018 CLINICAL DATA:  Left hip pain, evaluate for fluid collection EXAM: LEFT HIP ASPIRATION UNDER FLUOROSCOPY FLUOROSCOPY TIME:  Fluoroscopy Time:  18 seconds Radiation Exposure Index (if provided by the fluoroscopic device): 2.3 mGy Number of Acquired Spot Images: 1 PROCEDURE: Overlying skin prepped with Betadine, draped in the usual sterile fashion, and infiltrated locally with buffered Lidocaine. 20 gauge spinal needle advanced to the prosthetic junction of the femoral head/neck. 1 ml of Lidocaine injected easily. No fluid/frank infection could be aspirated. 4 mL Lidocaine and 6 mL sterile saline administered into the joint with attempted subsequent aspiration. Less than 1 mL fluid was aspirated and sent for laboratory evaluation. No immediate complication. IMPRESSION: Technically successful left hip aspiration under fluoroscopy, as described above. Electronically Signed   By: Julian Hy M.D.   On: 10/15/2018 11:18   Dg Hips Bilat With Pelvis 2v  Result Date: 10/16/2018 CLINICAL DATA:  Fall.  Pain. EXAM: DG HIP (WITH OR WITHOUT PELVIS) 2V BILAT COMPARISON:  None. FINDINGS: The patient is status post  bilateral hip replacements. Visualized hardware is in good position. No acute fractures. IMPRESSION: Bilateral hip replacements.  No acute fractures. Electronically Signed   By: Dorise Bullion III M.D   On: 10/16/2018 20:30   [place] Ekg Site Rite  Result Date: 10/15/2018 If Site Rite image not attached, placement could not be confirmed due to current cardiac rhythm.    LOS: 18 days   Oren Binet, MD  [facility]  If 7PM-7AM, please contact night-coverage  Please page via [email or web address]-Password [email or web address]-click on MD name and type text message  10/22/2018, 9:01 AM

## 2018-10-23 LAB — BASIC METABOLIC PANEL
Anion gap: 4 — ABNORMAL LOW (ref 5–15)
BUN: 33 mg/dL — ABNORMAL HIGH (ref 8–23)
CO2: 26 mmol/L (ref 22–32)
Calcium: 9.5 mg/dL (ref 8.9–10.3)
Chloride: 105 mmol/L (ref 98–111)
Creatinine, Ser: 0.96 mg/dL (ref 0.61–1.24)
GFR calc Af Amer: >60 mL/min (ref >60)
GFR calc non Af Amer: >60 mL/min (ref >60)
Glucose, Bld: 115 mg/dL — ABNORMAL HIGH (ref 70–99)
Potassium: 4.7 mmol/L (ref 3.5–5.1)
Sodium: 135 mmol/L (ref 135–145)

## 2018-10-23 LAB — CBC
HCT: 33.2 % — ABNORMAL LOW (ref 39.0–52.0)
Hemoglobin: 10.1 g/dL — ABNORMAL LOW (ref 13.0–17.0)
MCH: 27.4 pg (ref 26.0–34.0)
MCHC: 30.4 g/dL (ref 30.0–36.0)
MCV: 90.2 fL (ref 80.0–100.0)
Platelets: 274 10*3/uL (ref 150–400)
RBC: 3.68 MIL/uL — ABNORMAL LOW (ref 4.22–5.81)
RDW: 17.7 % — ABNORMAL HIGH (ref 11.5–15.5)
WBC: 6.6 10*3/uL (ref 4.0–10.5)
nRBC: 0 % (ref 0.0–0.2)

## 2018-10-23 MED ORDER — SENNA 8.6 MG PO TABS
2.0000 | ORAL_TABLET | Freq: Every day | ORAL | Status: DC
  Administered 2018-10-23 – 2018-10-27 (×5): 17.2 mg via ORAL
  Filled 2018-10-23 (×5): qty 2

## 2018-10-23 MED ORDER — BISACODYL 10 MG RE SUPP
10.0000 mg | Freq: Every day | RECTAL | Status: DC | PRN
  Administered 2018-10-25: 10 mg via RECTAL
  Filled 2018-10-23 (×2): qty 1

## 2018-10-23 MED ORDER — POLYETHYLENE GLYCOL 3350 17 G PO PACK: 17.0000 g | PACK | Freq: Every day | ORAL | Status: DC

## 2018-10-23 MED ORDER — POLYETHYLENE GLYCOL 3350 17 G PO PACK
17.0000 g | PACK | Freq: Every day | ORAL | Status: DC
  Administered 2018-10-24 – 2018-10-25 (×2): 17 g via ORAL
  Filled 2018-10-23 (×2): qty 1

## 2018-10-23 MED ORDER — SORBITOL 70 % SOLN
960.0000 mL | TOPICAL_OIL | Freq: Every day | ORAL | Status: DC | PRN
  Administered 2018-10-25: 960 mL via RECTAL
  Filled 2018-10-23 (×3): qty 473

## 2018-10-23 NOTE — Progress Notes (Signed)
PROGRESS NOTE        PATIENT DETAILS Name: [name] Age: 73 y.o. Sex: male Date of Birth: Nov 08, 1945 Admit Date: 10/01/2018 Admitting Physician Vianne Bulls, MD [id number], Hollice Espy, MD  Brief Narrative: Patient is a 73 y.o. male with history of A. 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  Found to have a soft tissue infection of the left hip area, acute kidney injury and hyperkalemia.  ID, orthopedics consulted-underwent left hip aspiration on 2/14, infectious disease recommending 6 weeks of IV Invanz.   See below for further details  Subjective: Complains of not having a good BM over the past 2 days-apparently had a small BM this morning.  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  Was on suppressive Flagyl for Bacteroides fragilis infection-recommendations from ID are for 6 weeks of IV ertapenem-end date of November 25, 2018.  AKI: Likely hemodynamically mediated-secondary to soft tissue infection of the left hip, diuretic use.  Resolved with supportive care.  Follow electrolytes periodically.  Hyperkalemia: Suspect secondary to AKI with use of Aldactone and potassium supplementation.  Resolved.  History of left total hip arthroplasty with subsequent infection with Bacteroides fragilis: Patient was evaluated by infectious disease at [facility] was previously on Flagyl.  Now on IV Invanz-Flagyl has been discontinued.  Recurrent pleural effusion: Felt to be secondary to hepatic hydrothorax-most recent CT chest on 10/07/2018 shows a small pleural effusion-patient is asymptomatic at this point.  Doubt further work-up required-furthermore he has had a recent extensive work-up including multiple thoracocentesis   Cryptogenic cirrhosis: Prior hepatitis serology done earlier this month was negative-agree with plans outlined in prior discharge summary for outpatient follow-up.  Volume status is stable-he is awake and alert-continue Aldactone  Atrial fibrillation: Rate controlled with Cardizem.Continue Eliquis.  Chronic back and right shoulder pain: Continue Lidoderm patch, Tylenol and as needed narcotics.  Bipolar disorder: Stable-continue Lexapro, Neurontin  Constipation: Start scheduled MiraLAX and senna-we will use prn Dulcolax moderate constipation and prn smog enema for severe constipation  OSA: Continue CPAP nightly  Morbid obesity  Acute on chronic debility/deconditioning: Has chronic debility/deconditioning at baseline-this is worsened due to acute illness/AKI/left hip infection.  Per social work-very difficult to place to SNF.  DVT Prophylaxis: Full dose anticoagulation with Eliquis  Code Status: Full code  Family Communication: None at bedside  Disposition Plan: Remain inpatient- SNF on discharge when bed available  Antimicrobial agents: Anti-infectives (From admission, onward)   Start     Dose/Rate Route Frequency Ordered Stop   10/15/18 1500  cefTRIAXone (ROCEPHIN) 2 g in sodium chloride 0.9 % 100 mL IVPB  Status:  Discontinued     2 g 200 mL/hr over 30 Minutes Intravenous Every 24 hours 10/15/18 1434 10/15/18 1447   10/15/18 1500  ertapenem (INVANZ) 1,000 mg in sodium chloride 0.9 % 100 mL IVPB  1 g 200 mL/hr over 30 Minutes Intravenous Every 24 hours 10/15/18 1447 11/25/18 2359    10/08/18 0000  doxycycline (VIBRA-TABS) 100 MG tablet     100 mg Oral Every 12 hours 10/08/18 0921     10/06/18 1000  doxycycline (VIBRA-TABS) tablet 100 mg  Status:  Discontinued     100 mg Oral Every 12 hours 10/06/18 0726 10/11/18 1328   10/03/18 1800  metroNIDAZOLE (FLAGYL) tablet 500 mg  Status:  Discontinued     500 mg Oral Every 8 hours 10/03/18 1054 10/15/18 1447   10/03/18 0700  vancomycin (VANCOCIN) 2,000 mg in sodium chloride 0.9 % 500 mL IVPB  Status:  Discontinued     2,000 mg 250 mL/hr over 120 Minutes Intravenous Every 36 hours 10/01/18 1942 10/06/18 0726   10/02/18 0200  ceFEPIme (MAXIPIME) 2 g in sodium chloride 0.9 % 100 mL IVPB  Status:  Discontinued     2 g 200 mL/hr over 30 Minutes Intravenous Every 8 hours 10/01/18 1942 10/02/18 1008   10/01/18 1830  vancomycin (VANCOCIN) 2,500 mg in sodium chloride 0.9 % 500 mL IVPB     2,500 mg 250 mL/hr over 120 Minutes Intravenous STAT 10/01/18 1825 10/02/18 0126   10/01/18 1800  ceFEPIme (MAXIPIME) 2 g in sodium chloride 0.9 % 100 mL IVPB     2 g 200 mL/hr over 30 Minutes Intravenous  Once 10/01/18 1747 10/01/18 1903   10/01/18 1800  metroNIDAZOLE (FLAGYL) IVPB 500 mg  Status:  Discontinued     500 mg 100 mL/hr over 60 Minutes Intravenous Every 8 hours 10/01/18 1747 10/03/18 1054   10/01/18 1800  vancomycin (VANCOCIN) IVPB 1000 mg/200 mL premix  Status:  Discontinued     1,000 mg 200 mL/hr over 60 Minutes Intravenous  Once 10/01/18 1747 10/01/18 1825      Procedures: None  CONSULTS:  None  Time spent: 25- minutes-Greater than 50% of this time was spent in counseling, explanation of diagnosis, planning of further management, and coordination of care.  MEDICATIONS: Scheduled Meds: . apixaban  5 mg Oral BID  . cycloSPORINE  1 drop Both Eyes BID  . diltiazem  180 mg Oral Daily  . escitalopram  10 mg Oral Daily  . gabapentin  100 mg Oral TID  . lidocaine  1 patch Transdermal Q24H  . Melatonin  6 mg Oral QHS  .  midodrine  10 mg Oral TID WC  . multivitamin with minerals  1 tablet Oral Daily  . pantoprazole  40 mg Oral Daily  . polyethylene glycol  17 g Oral Daily  . ENSURE MAX PROTEIN  11 oz Oral BID  . senna  2 tablet Oral QHS  . spironolactone  50 mg Oral Daily  . thiamine  100 mg Oral Daily   Continuous Infusions: . sodium chloride 10 mL/hr at 10/22/18 2305  . ertapenem Stopped (10/22/18 1600)   PRN Meds:.sodium chloride, acetaminophen, albuterol, alum & mag hydroxide-simeth, bisacodyl, diltiazem, hydrOXYzine, menthol-cetylpyridinium, [DISCONTINUED] ondansetron **OR** ondansetron (ZOFRAN) IV, oxyCODONE, sodium chloride flush, sorbitol, milk of mag, mineral oil, glycerin (SMOG) enema, traZODone   PHYSICAL EXAM: Vital signs: Vitals:   10/22/18 2100 10/23/18 0434 10/23/18 0515 10/23/18 0918  BP: 113/67  (!) 88/58 104/63  Pulse: 88  94 84  Resp: 20  16   Temp: 98.2 F (36.8 C)  99 F (37.2 C)   TempSrc: Oral     SpO2: 95%  97%   Weight:  122.7 kg  Height:       Filed Weights   10/21/18 0500 10/22/18 0500 10/23/18 0434  Weight: 122.8 kg 122.7 kg 122.7 kg   Body mass index is 41.13 kg/m.   General appearance:Awake, alert, not in any distress.  Eyes:no scleral icterus. HEENT: Atraumatic and Normocephalic Neck: supple, no JVD. Resp:Good air entry bilaterally,no rales or rhonchi CVS: S1 S2 regular, no murmurs.  GI: Bowel sounds present, Non tender and not distended with no gaurding, rigidity or rebound. Extremities: B/L Lower Ext shows no edema, both legs are warm to touch Neurology:  Non focal Musculoskeletal:No digital cyanosis Skin:No Rash, warm and dry Wounds:N/A  LABORATORY DATA: CBC: Recent Labs  Lab 10/17/18 0341 10/18/18 0402 10/19/18 0407 10/20/18 0410 10/22/18 2357  WBC 8.8 8.2 7.1 7.1 6.6  NEUTROABS 6.7 5.6 4.4 4.2  --   HGB 11.4* 10.9* 10.1* 10.2* 10.1*  HCT 35.7* 34.3* 33.7* 34.1* 33.2*  MCV 88.1 89.1 89.6 90.0 90.2  PLT 313 295 274 287 274     Basic Metabolic Panel: Recent Labs  Lab 10/17/18 0341 10/18/18 0402 10/19/18 0407 10/20/18 0410 10/21/18 0319 10/22/18 2357  NA 134* 134* 135 136 134* 135  K 4.6 4.8 4.7 4.5 5.1 4.7  CL 103 102 103 103 105 105  CO2 23 24 24 26  20* 26  GLUCOSE 115* 102* 105* 117* 84 115*  BUN 41* 38* 38* 41* 38* 33*  CREATININE 0.94 0.97 0.95 0.94 1.06 0.96  CALCIUM 10.1 9.8 9.8 9.7 9.5 9.5  MG 2.1 2.1 2.0 2.1 2.0  --     GFR: Estimated Creatinine Clearance: 88.6 mL/min (by C-G formula based on SCr of 0.96 mg/dL).  Liver Function Tests: Recent Labs  Lab 10/18/18 0402  AST 17  ALT 12  ALKPHOS 45  BILITOT 0.6  PROT 6.3*  ALBUMIN 2.6*   No results for input(s): LIPASE, AMYLASE in the last 168 hours. No results for input(s): AMMONIA in the last 168 hours.  Coagulation Profile: No results for input(s): INR, PROTIME in the last 168 hours.  Cardiac Enzymes: No results for input(s): CKTOTAL, CKMB, CKMBINDEX, TROPONINI in the last 168 hours.  BNP (last 3 results) No results for input(s): PROBNP in the last 8760 hours.  HbA1C: No results for input(s): HGBA1C in the last 72 hours.  CBG: Recent Labs  Lab 10/18/18 0803 10/19/18 0758 10/20/18 0810 10/21/18 0816 10/22/18 0801  GLUCAP 145* 85 81 94 89    Lipid Profile: No results for input(s): CHOL, HDL, LDLCALC, TRIG, CHOLHDL, LDLDIRECT in the last 72 hours.  Thyroid Function Tests: No results for input(s): TSH, T4TOTAL, FREET4, T3FREE, THYROIDAB in the last 72 hours.  Anemia Panel: No results for input(s): VITAMINB12, FOLATE, FERRITIN, TIBC, IRON, RETICCTPCT in the last 72 hours.  Urine analysis:    Component Value Date/Time   COLORURINE YELLOW 10/01/2018 1822   APPEARANCEUR CLEAR 10/01/2018 1822   APPEARANCEUR Clear 01/18/2018 1539   LABSPEC 1.010 10/01/2018 1822   PHURINE 5.0 10/01/2018 1822   GLUCOSEU NEGATIVE 10/01/2018 1822   HGBUR NEGATIVE 10/01/2018 1822   BILIRUBINUR NEGATIVE 10/01/2018 1822   BILIRUBINUR  Negative 01/18/2018 1539   KETONESUR NEGATIVE 10/01/2018 1822   PROTEINUR NEGATIVE 10/01/2018 1822   UROBILINOGEN 0.2 09/06/2012 0213   NITRITE NEGATIVE 10/01/2018 1822   LEUKOCYTESUR NEGATIVE 10/01/2018 1822   LEUKOCYTESUR Negative 01/18/2018 1539    Sepsis Labs: Lactic Acid, Venous    Component Value Date/Time   LATICACIDVEN 1.7 10/01/2018 1833    MICROBIOLOGY: Recent Results (from  the past 240 hour(s))  Body fluid culture     Status: None   Collection Time: 10/15/18 10:22 AM  Result Value Ref Range Status   Specimen Description SYNOVIAL LEFT HIP  Final   Special Requests NONE  Final   Gram Stain NO WBC SEEN NO ORGANISMS SEEN   Final   Culture   Final    NO GROWTH 3 DAYS Performed at [facility], [address], [place], [place] [address]    Report Status 10/18/2018 FINAL  Final    RADIOLOGY STUDIES/RESULTS: Dg Chest 1 View  Result Date: 10/01/2018 CLINICAL DATA:  Follow-up pneumothorax. EXAM: CHEST  1 VIEW COMPARISON:  09/09/2018 FINDINGS: Mild bilateral interstitial thickening. Small bilateral pleural effusions. Left lateral pleural thickening with a subtle lucency which may reflect a small loculated pneumothorax versus artifact. Right infrahilar hazy airspace disease likely reflecting atelectasis. Stable cardiomediastinal silhouette. No aggressive osseous lesion. Mild osteoarthritis of bilateral glenohumeral joints. IMPRESSION: Left lateral pleural thickening with a subtle lucency which may reflect a small loculated pneumothorax versus artifact. Recommend follow-up chest x-ray for re-evaluation. Bilateral interstitial thickening and small bilateral pleural effusions concerning for mild pulmonary edema. Electronically Signed   By: Kathreen Devoid   On: 10/01/2018 15:38   Dg Knee 1-2 Views Left  Result Date: 10/16/2018 CLINICAL DATA:  Pain after fall EXAM: LEFT KNEE - 1-2 VIEW COMPARISON:  None. FINDINGS: No acute fracture or dislocation. No joint effusion. Moderate to  severe degenerative changes. IMPRESSION: Degenerative changes.  No acute fracture noted. Electronically Signed   By: Dorise Bullion III M.D   On: 10/16/2018 20:32   Dg Knee 1-2 Views Right  Result Date: 10/16/2018 CLINICAL DATA:  Pain after fall EXAM: RIGHT KNEE - 1-2 VIEW COMPARISON:  None. FINDINGS: No acute fracture or effusion. Moderate to severe degenerative changes. IMPRESSION: No acute abnormality. Electronically Signed   By: Dorise Bullion III M.D   On: 10/16/2018 20:32   Ct Chest Wo Contrast  Result Date: 10/07/2018 CLINICAL DATA:  Chronic dyspnea. EXAM: CT CHEST WITHOUT CONTRAST TECHNIQUE: Multidetector CT imaging of the chest was performed following the standard protocol without IV contrast. COMPARISON:  Portable chest obtained earlier today and chest CTA dated 08/17/2018. FINDINGS: Cardiovascular: Atheromatous calcifications, including the coronary arteries and aorta. Stable borderline enlargement of the heart with mild biatrial enlargement. No pericardial effusion. Mediastinum/Nodes: No enlarged mediastinal or axillary lymph nodes. Thyroid gland, trachea, and esophagus demonstrate no significant findings. Lungs/Pleura: Small to moderate-sized right pleural effusion. Stable left pleural thickening with calcifications. Mild bilateral atelectasis and scarring. Upper Abdomen: Post gastric bypass changes. Musculoskeletal: Mild dextroconvex thoracolumbar scoliosis. Extensive thoracic and lower cervical spine degenerative changes. IMPRESSION: 1. Small to moderate-sized right pleural effusion. 2. Mild bilateral atelectasis and scarring. 3. 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 4.  Calcific coronary artery and aortic atherosclerosis. Aortic Atherosclerosis (ICD10-I70.0). Electronically Signed   By: Claudie Revering M.D.   On: 10/07/2018 20:31   Ct Hip Left W Contrast  Result Date: 10/01/2018 CLINICAL DATA:  Left hip pain EXAM: CT OF THE LOWER  LEFT EXTREMITY WITH CONTRAST TECHNIQUE: Multidetector CT imaging of the lower left extremity was performed according to the standard protocol following intravenous contrast administration. COMPARISON:  09/03/2018 CONTRAST:  186mL OMNIPAQUE IOHEXOL 300 MG/ML  SOLN FINDINGS: Bones/Joint/Cartilage An uncemented left total hip arthroplasty is identified with the tip traversing a left femoral diaphyseal fracture that is incompletely healed. Subtle residual fracture lucencies are suggested on the  coronal reformats, series 6/46 through 48 for instance. There is callus formation developing about the fracture, series 3/143. Slight residual varus configuration about the fracture is seen of the femur. No new/acute appearing fracture or bone destruction is seen. With reference to the hip joint, no significant joint effusion or abnormal lucencies to suggest loosening. The prosthetic femoral head is seated within the acetabular component without evidence of asymmetric lining wear. No soft tissue mass or mineralization. No hematoma or abnormal fluid collections. Osteoarthritis of the included SI joints and pubic symphysis. No diastasis. Ligaments Suboptimally assessed by CT. Muscles and Tendons Negative for muscle edema, hematoma or definite tear. Soft tissues No soft tissue mass, mineralization or fluid collections. Postop scarring along the lateral aspect of the hip. IMPRESSION: 1. An uncemented left total hip arthroplasty is identified with the tip traversing a left femoral diaphyseal fracture that is incompletely healed. Subtle residual fracture lucencies are suggested on the coronal reformats, series 6/46 through 48. Slight residual varus configuration of the femur about the fracture is noted. 2. No new/acute appearing fracture or bone destruction is seen. 3. No significant joint effusion to suggest septic arthritis though this is of clinical concern, percutaneous sampling of the joint fluid could be attempted. No definite  evidence of hardware loosening however. Electronically Signed   By: Ashley Royalty M.D.   On: 10/01/2018 20:08   Dg Chest Port 1 View  Result Date: 10/16/2018 CLINICAL DATA:  PICC line placement. EXAM: PORTABLE CHEST 1 VIEW COMPARISON:  Radiographs and CT 10/07/2018. FINDINGS: 1223 hours. Left arm PICC tip is not optimally visualized, although appears to extend to the lower SVC level. The heart size and mediastinal contours are stable. There is aortic atherosclerosis. Bilateral pleural thickening and subpleural rounded atelectasis peripherally in the left lung are unchanged. There is no pneumothorax. Glenohumeral degenerative changes are present bilaterally. IMPRESSION: PICC line tip at the lower SVC level. No pneumothorax or acute findings. Electronically Signed   By: Richardean Sale M.D.   On: 10/16/2018 13:03   Dg Chest Port 1 View  Result Date: 10/07/2018 CLINICAL DATA:  Shortness of breath. EXAM: PORTABLE CHEST 1 VIEW COMPARISON:  10/01/2017. FINDINGS: Left lower chest incompletely imaged. Cardiomegaly with diffuse bilateral interstitial prominence and bilateral pleural effusions. Findings have progressed from prior exam are most consistent with CHF. A component of pleural scarring may be present. IMPRESSION: Congestive heart failure with bilateral interstitial edema. Bibasilar pneumonia can not be excluded. Bilateral pleural effusions. A component of pleural scarring may be present. Electronically Signed   By: Marcello Moores  Register   On: 10/07/2018 08:58   Dg Chest Port 1 View  Result Date: 10/01/2018 CLINICAL DATA:  LEFT hip replacement 6 months ago, sepsis, question LEFT leg/hip infection EXAM: PORTABLE CHEST 1 VIEW COMPARISON:  Portable exam 1756 hours compared to 10/01/2018 FINDINGS: RIGHT costophrenic angle excluded. Enlargement of cardiac silhouette. Mediastinal contours and pulmonary vascularity normal. Atherosclerotic calcification aorta. Small LEFT pleural effusion and basilar atelectasis at lower  LEFT chest question partially loculated. No definite infiltrate or pneumothorax. Bones demineralized. IMPRESSION: LEFT pleural effusion and basilar atelectasis, question partially locule at the lower lateral LEFT chest. Electronically Signed   By: Lavonia Dana M.D.   On: 10/01/2018 18:05   Dg Foot 2 Views Left  Result Date: 10/17/2018 CLINICAL DATA:  Pain in left foot. EXAM: LEFT FOOT - 2 VIEW COMPARISON:  None. FINDINGS: Osteopenia. No fractures in the toes or metacarpals. Enthesopathic change at the posterior calcaneus. There is a lucency along  the inferior aspect of the calcaneus which does not appear to extend through the calcaneus and is relatively well corticated. No other bony abnormalities. IMPRESSION: 1. There is a lucency along the inferior aspect of the calcaneus which is relatively well corticated and does not appear to extend through the calcaneus. This is favored to be nonacute. Recommend clinical correlation to exclude pain in this region. Electronically Signed   By: Dorise Bullion III M.D   On: 10/17/2018 15:05   Dg Fluoro Guided Needle Plc Aspiration/injection Loc  Result Date: 10/15/2018 CLINICAL DATA:  Left hip pain, evaluate for fluid collection EXAM: LEFT HIP ASPIRATION UNDER FLUOROSCOPY FLUOROSCOPY TIME:  Fluoroscopy Time:  18 seconds Radiation Exposure Index (if provided by the fluoroscopic device): 2.3 mGy Number of Acquired Spot Images: 1 PROCEDURE: Overlying skin prepped with Betadine, draped in the usual sterile fashion, and infiltrated locally with buffered Lidocaine. 20 gauge spinal needle advanced to the prosthetic junction of the femoral head/neck. 1 ml of Lidocaine injected easily. No fluid/frank infection could be aspirated. 4 mL Lidocaine and 6 mL sterile saline administered into the joint with attempted subsequent aspiration. Less than 1 mL fluid was aspirated and sent for laboratory evaluation. No immediate complication. IMPRESSION: Technically successful left hip  aspiration under fluoroscopy, as described above. Electronically Signed   By: Julian Hy M.D.   On: 10/15/2018 11:18   Dg Hips Bilat With Pelvis 2v  Result Date: 10/16/2018 CLINICAL DATA:  Fall.  Pain. EXAM: DG HIP (WITH OR WITHOUT PELVIS) 2V BILAT COMPARISON:  None. FINDINGS: The patient is status post bilateral hip replacements. Visualized hardware is in good position. No acute fractures. IMPRESSION: Bilateral hip replacements.  No acute fractures. Electronically Signed   By: Dorise Bullion III M.D   On: 10/16/2018 20:30   [place] Ekg Site Rite  Result Date: 10/15/2018 If Site Rite image not attached, placement could not be confirmed due to current cardiac rhythm.    LOS: 19 days   Oren Binet, MD  [facility]  If 7PM-7AM, please contact night-coverage  Please page via [email or web address] [email or web address]-click on MD name and type text message  10/23/2018, 12:50 PM

## 2018-10-23 NOTE — Plan of Care (Signed)
Discussed with the patient plan of care for the evening, pain management and bowel movement with some teach back displayed

## 2018-10-23 NOTE — Progress Notes (Signed)
Patient places self on CPAP. RT not needed at this time.

## 2018-10-23 NOTE — Plan of Care (Signed)
Discussed with patient plan of care for the evening, pain management and using bedpan with some teach back displayed.  Patient's bedpan cracked on him earlier and has an abrasion to his back thigh at this time (cleansed and barrier cream applied).

## 2018-10-24 MED ORDER — DIPHENHYDRAMINE HCL 25 MG PO CAPS
25.0000 mg | ORAL_CAPSULE | Freq: Once | ORAL | Status: AC
  Administered 2018-10-25: 25 mg via ORAL
  Filled 2018-10-24: qty 1

## 2018-10-24 MED ORDER — ZOLPIDEM TARTRATE 5 MG PO TABS
5.0000 mg | ORAL_TABLET | Freq: Once | ORAL | Status: AC
  Administered 2018-10-24: 5 mg via ORAL
  Filled 2018-10-24: qty 1

## 2018-10-24 NOTE — Progress Notes (Addendum)
PROGRESS NOTE        PATIENT DETAILS Name: [name] Age: 73 y.o. Sex: male Date of Birth: 05-Apr-1946 Admit Date: 10/01/2018 Admitting Physician Vianne Bulls, MD [id number], Hollice Espy, MD  Brief Narrative: Patient is a 73 y.o. male with history of A. 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  Found to have a soft tissue infection of the left hip area, acute kidney injury and hyperkalemia.  ID, orthopedics consulted-underwent left hip aspiration on 2/14, infectious disease recommending 6 weeks of IV Invanz.   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  Was on suppressive Flagyl for Bacteroides fragilis infection-recommendations from ID are for 6 weeks of IV ertapenem-end date of November 25, 2018.  AKI: Likely hemodynamically mediated-secondary to soft tissue infection of the left hip, diuretic use.  Resolved with supportive care.  Follow electrolytes periodically.  Hyperkalemia: Suspect secondary to AKI with use of Aldactone and potassium supplementation.  Resolved.  History of left total hip arthroplasty with subsequent infection with Bacteroides fragilis: Patient was evaluated by infectious disease at [facility] was previously on Flagyl.   Now on IV Invanz-Flagyl has been discontinued.  Recurrent pleural effusion: Felt to be secondary to hepatic hydrothorax-most recent CT chest on 10/07/2018 shows a small pleural effusion-patient is asymptomatic at this point.  Doubt further work-up required-furthermore he has had a recent extensive work-up including multiple thoracocentesis   Cryptogenic cirrhosis: Prior hepatitis serology done earlier this month was negative-agree with plans outlined in prior discharge summary for outpatient follow-up.  Volume status is stable-he is awake and alert-continue Aldactone  Atrial fibrillation: Rate controlled with Cardizem.Continue Eliquis.  Chronic back and right shoulder pain: Continue Lidoderm patch, Tylenol and as needed narcotics.  Bipolar disorder: Stable-continue Lexapro, Neurontin  Constipation: better-continue miralax/senokot  OSA: Continue CPAP nightly  Morbid obesity  Acute on chronic debility/deconditioning: Has chronic debility/deconditioning at baseline-this is worsened due to acute illness/AKI/left hip infection.  Per social work-very difficult to place to SNF.  DVT Prophylaxis: Full dose anticoagulation with Eliquis  Code Status: Full code  Family Communication: None at bedside  Disposition Plan: Remain inpatient- SNF on discharge when bed available  Antimicrobial agents: Anti-infectives (From admission, onward)   Start     Dose/Rate Route Frequency Ordered Stop   10/15/18 1500  cefTRIAXone (ROCEPHIN) 2 g in sodium chloride 0.9 % 100 mL IVPB  Status:  Discontinued     2 g 200 mL/hr over 30 Minutes Intravenous Every 24 hours 10/15/18 1434 10/15/18 1447   10/15/18 1500  ertapenem (INVANZ) 1,000 mg in sodium chloride 0.9 % 100 mL IVPB     1 g 200 mL/hr over 30 Minutes Intravenous Every 24 hours 10/15/18 1447 11/25/18 2359   10/08/18 0000  doxycycline (VIBRA-TABS) 100 MG tablet  100 mg Oral Every 12 hours 10/08/18 0921     10/06/18 1000  doxycycline (VIBRA-TABS)  tablet 100 mg  Status:  Discontinued     100 mg Oral Every 12 hours 10/06/18 0726 10/11/18 1328   10/03/18 1800  metroNIDAZOLE (FLAGYL) tablet 500 mg  Status:  Discontinued     500 mg Oral Every 8 hours 10/03/18 1054 10/15/18 1447   10/03/18 0700  vancomycin (VANCOCIN) 2,000 mg in sodium chloride 0.9 % 500 mL IVPB  Status:  Discontinued     2,000 mg 250 mL/hr over 120 Minutes Intravenous Every 36 hours 10/01/18 1942 10/06/18 0726   10/02/18 0200  ceFEPIme (MAXIPIME) 2 g in sodium chloride 0.9 % 100 mL IVPB  Status:  Discontinued     2 g 200 mL/hr over 30 Minutes Intravenous Every 8 hours 10/01/18 1942 10/02/18 1008   10/01/18 1830  vancomycin (VANCOCIN) 2,500 mg in sodium chloride 0.9 % 500 mL IVPB     2,500 mg 250 mL/hr over 120 Minutes Intravenous STAT 10/01/18 1825 10/02/18 0126   10/01/18 1800  ceFEPIme (MAXIPIME) 2 g in sodium chloride 0.9 % 100 mL IVPB     2 g 200 mL/hr over 30 Minutes Intravenous  Once 10/01/18 1747 10/01/18 1903   10/01/18 1800  metroNIDAZOLE (FLAGYL) IVPB 500 mg  Status:  Discontinued     500 mg 100 mL/hr over 60 Minutes Intravenous Every 8 hours 10/01/18 1747 10/03/18 1054   10/01/18 1800  vancomycin (VANCOCIN) IVPB 1000 mg/200 mL premix  Status:  Discontinued     1,000 mg 200 mL/hr over 60 Minutes Intravenous  Once 10/01/18 1747 10/01/18 1825      Procedures: None  CONSULTS:  None  Time spent: 25- minutes-Greater than 50% of this time was spent in counseling, explanation of diagnosis, planning of further management, and coordination of care.  MEDICATIONS: Scheduled Meds: . apixaban  5 mg Oral BID  . cycloSPORINE  1 drop Both Eyes BID  . diltiazem  180 mg Oral Daily  . escitalopram  10 mg Oral Daily  . gabapentin  100 mg Oral TID  . lidocaine  1 patch Transdermal Q24H  . Melatonin  6 mg Oral QHS  . midodrine  10 mg Oral TID WC  . multivitamin with minerals  1 tablet Oral Daily  . pantoprazole  40 mg Oral Daily  . polyethylene glycol  17 g Oral  QHS  . ENSURE MAX PROTEIN  11 oz Oral BID  . senna  2 tablet Oral QHS  . spironolactone  50 mg Oral Daily  . thiamine  100 mg Oral Daily   Continuous Infusions: . sodium chloride 250 mL (10/24/18 0052)  . ertapenem Stopped (10/23/18 1600)   PRN Meds:.sodium chloride, acetaminophen, albuterol, alum & mag hydroxide-simeth, bisacodyl, diltiazem, hydrOXYzine, menthol-cetylpyridinium, [DISCONTINUED] ondansetron **OR** ondansetron (ZOFRAN) IV, oxyCODONE, sodium chloride flush, sorbitol, milk of mag, mineral oil, glycerin (SMOG) enema, traZODone   PHYSICAL EXAM: Vital signs: Vitals:   10/23/18 1652 10/23/18 2126 10/24/18 0442 10/24/18 0500  BP: 106/75 109/75 118/73   Pulse: 89 (!) 103 (!) 104   Resp: 20 18 18    Temp: 98.9 F (37.2 C) 98.9 F (37.2 C) 97.9 F (36.6 C)   TempSrc: Oral Oral Oral   SpO2: 98% 96% 94%   Weight:    121.9 kg  Height:       Filed Weights   10/22/18 0500 10/23/18 0434 10/24/18 0500  Weight: 122.7 kg 122.7 kg 121.9 kg  Body mass index is 40.86 kg/m.   General appearance:Awake, alert, not in any distress.  Eyes:no scleral icterus. HEENT: Atraumatic and Normocephalic Neck: supple, no JVD. Resp:Good air entry bilaterally,no rales or rhonchi CVS: S1 S2 regular, no murmurs.  GI: Bowel sounds present, Non tender and not distended with no gaurding, rigidity or rebound. Extremities: B/L Lower Ext shows no edema, both legs are warm to touch Neurology:  Non focal Musculoskeletal:No digital cyanosis Skin:No Rash, warm and dry Wounds:N/A  LABORATORY DATA: CBC: Recent Labs  Lab 10/18/18 0402 10/19/18 0407 10/20/18 0410 10/22/18 2357  WBC 8.2 7.1 7.1 6.6  NEUTROABS 5.6 4.4 4.2  --   HGB 10.9* 10.1* 10.2* 10.1*  HCT 34.3* 33.7* 34.1* 33.2*  MCV 89.1 89.6 90.0 90.2  PLT 295 274 287 128    Basic Metabolic Panel: Recent Labs  Lab 10/18/18 0402 10/19/18 0407 10/20/18 0410 10/21/18 0319 10/22/18 2357  NA 134* 135 136 134* 135  K 4.8 4.7 4.5 5.1  4.7  CL 102 103 103 105 105  CO2 24 24 26  20* 26  GLUCOSE 102* 105* 117* 84 115*  BUN 38* 38* 41* 38* 33*  CREATININE 0.97 0.95 0.94 1.06 0.96  CALCIUM 9.8 9.8 9.7 9.5 9.5  MG 2.1 2.0 2.1 2.0  --     GFR: Estimated Creatinine Clearance: 88.3 mL/min (by C-G formula based on SCr of 0.96 mg/dL).  Liver Function Tests: Recent Labs  Lab 10/18/18 0402  AST 17  ALT 12  ALKPHOS 45  BILITOT 0.6  PROT 6.3*  ALBUMIN 2.6*   No results for input(s): LIPASE, AMYLASE in the last 168 hours. No results for input(s): AMMONIA in the last 168 hours.  Coagulation Profile: No results for input(s): INR, PROTIME in the last 168 hours.  Cardiac Enzymes: No results for input(s): CKTOTAL, CKMB, CKMBINDEX, TROPONINI in the last 168 hours.  BNP (last 3 results) No results for input(s): PROBNP in the last 8760 hours.  HbA1C: No results for input(s): HGBA1C in the last 72 hours.  CBG: Recent Labs  Lab 10/18/18 0803 10/19/18 0758 10/20/18 0810 10/21/18 0816 10/22/18 0801  GLUCAP 145* 85 81 94 89    Lipid Profile: No results for input(s): CHOL, HDL, LDLCALC, TRIG, CHOLHDL, LDLDIRECT in the last 72 hours.  Thyroid Function Tests: No results for input(s): TSH, T4TOTAL, FREET4, T3FREE, THYROIDAB in the last 72 hours.  Anemia Panel: No results for input(s): VITAMINB12, FOLATE, FERRITIN, TIBC, IRON, RETICCTPCT in the last 72 hours.  Urine analysis:    Component Value Date/Time   COLORURINE YELLOW 10/01/2018 1822   APPEARANCEUR CLEAR 10/01/2018 1822   APPEARANCEUR Clear 01/18/2018 1539   LABSPEC 1.010 10/01/2018 1822   PHURINE 5.0 10/01/2018 1822   GLUCOSEU NEGATIVE 10/01/2018 1822   HGBUR NEGATIVE 10/01/2018 1822   BILIRUBINUR NEGATIVE 10/01/2018 1822   BILIRUBINUR Negative 01/18/2018 1539   KETONESUR NEGATIVE 10/01/2018 1822   PROTEINUR NEGATIVE 10/01/2018 1822   UROBILINOGEN 0.2 09/06/2012 0213   NITRITE NEGATIVE 10/01/2018 1822   LEUKOCYTESUR NEGATIVE 10/01/2018 1822    LEUKOCYTESUR Negative 01/18/2018 1539    Sepsis Labs: Lactic Acid, Venous    Component Value Date/Time   LATICACIDVEN 1.7 10/01/2018 1833    MICROBIOLOGY: Recent Results (from the past 240 hour(s))  Body fluid culture     Status: None   Collection Time: 10/15/18 10:22 AM  Result Value Ref Range Status   Specimen Description SYNOVIAL LEFT HIP  Final   Special Requests NONE  Final   Gram  Stain NO WBC SEEN NO ORGANISMS SEEN   Final   Culture   Final    NO GROWTH 3 DAYS Performed at [facility], [place] [address], [place], [place] [address]    Report Status 10/18/2018 FINAL  Final    RADIOLOGY STUDIES/RESULTS: Dg Chest 1 View  Result Date: 10/01/2018 CLINICAL DATA:  Follow-up pneumothorax. EXAM: CHEST  1 VIEW COMPARISON:  09/09/2018 FINDINGS: Mild bilateral interstitial thickening. Small bilateral pleural effusions. Left lateral pleural thickening with a subtle lucency which may reflect a small loculated pneumothorax versus artifact. Right infrahilar hazy airspace disease likely reflecting atelectasis. Stable cardiomediastinal silhouette. No aggressive osseous lesion. Mild osteoarthritis of bilateral glenohumeral joints. IMPRESSION: Left lateral pleural thickening with a subtle lucency which may reflect a small loculated pneumothorax versus artifact. Recommend follow-up chest x-ray for re-evaluation. Bilateral interstitial thickening and small bilateral pleural effusions concerning for mild pulmonary edema. Electronically Signed   By: Kathreen Devoid   On: 10/01/2018 15:38   Dg Knee 1-2 Views Left  Result Date: 10/16/2018 CLINICAL DATA:  Pain after fall EXAM: LEFT KNEE - 1-2 VIEW COMPARISON:  None. FINDINGS: No acute fracture or dislocation. No joint effusion. Moderate to severe degenerative changes. IMPRESSION: Degenerative changes.  No acute fracture noted. Electronically Signed   By: Dorise Bullion III M.D   On: 10/16/2018 20:32   Dg Knee 1-2 Views Right  Result Date:  10/16/2018 CLINICAL DATA:  Pain after fall EXAM: RIGHT KNEE - 1-2 VIEW COMPARISON:  None. FINDINGS: No acute fracture or effusion. Moderate to severe degenerative changes. IMPRESSION: No acute abnormality. Electronically Signed   By: Dorise Bullion III M.D   On: 10/16/2018 20:32   Ct Chest Wo Contrast  Result Date: 10/07/2018 CLINICAL DATA:  Chronic dyspnea. EXAM: CT CHEST WITHOUT CONTRAST TECHNIQUE: Multidetector CT imaging of the chest was performed following the standard protocol without IV contrast. COMPARISON:  Portable chest obtained earlier today and chest CTA dated 08/17/2018. FINDINGS: Cardiovascular: Atheromatous calcifications, including the coronary arteries and aorta. Stable borderline enlargement of the heart with mild biatrial enlargement. No pericardial effusion. Mediastinum/Nodes: No enlarged mediastinal or axillary lymph nodes. Thyroid gland, trachea, and esophagus demonstrate no significant findings. Lungs/Pleura: Small to moderate-sized right pleural effusion. Stable left pleural thickening with calcifications. Mild bilateral atelectasis and scarring. Upper Abdomen: Post gastric bypass changes. Musculoskeletal: Mild dextroconvex thoracolumbar scoliosis. Extensive thoracic and lower cervical spine degenerative changes. IMPRESSION: 1. Small to moderate-sized right pleural effusion. 2. Mild bilateral atelectasis and scarring. 3. 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 4.  Calcific coronary artery and aortic atherosclerosis. Aortic Atherosclerosis (ICD10-I70.0). Electronically Signed   By: Claudie Revering M.D.   On: 10/07/2018 20:31   Ct Hip Left W Contrast  Result Date: 10/01/2018 CLINICAL DATA:  Left hip pain EXAM: CT OF THE LOWER LEFT EXTREMITY WITH CONTRAST TECHNIQUE: Multidetector CT imaging of the lower left extremity was performed according to the standard protocol following intravenous contrast administration. COMPARISON:   09/03/2018 CONTRAST:  154mL OMNIPAQUE IOHEXOL 300 MG/ML  SOLN FINDINGS: Bones/Joint/Cartilage An uncemented left total hip arthroplasty is identified with the tip traversing a left femoral diaphyseal fracture that is incompletely healed. Subtle residual fracture lucencies are suggested on the coronal reformats, series 6/46 through 48 for instance. There is callus formation developing about the fracture, series 3/143. Slight residual varus configuration about the fracture is seen of the femur. No new/acute appearing fracture or bone destruction is seen. With reference to the hip joint, no  significant joint effusion or abnormal lucencies to suggest loosening. The prosthetic femoral head is seated within the acetabular component without evidence of asymmetric lining wear. No soft tissue mass or mineralization. No hematoma or abnormal fluid collections. Osteoarthritis of the included SI joints and pubic symphysis. No diastasis. Ligaments Suboptimally assessed by CT. Muscles and Tendons Negative for muscle edema, hematoma or definite tear. Soft tissues No soft tissue mass, mineralization or fluid collections. Postop scarring along the lateral aspect of the hip. IMPRESSION: 1. An uncemented left total hip arthroplasty is identified with the tip traversing a left femoral diaphyseal fracture that is incompletely healed. Subtle residual fracture lucencies are suggested on the coronal reformats, series 6/46 through 48. Slight residual varus configuration of the femur about the fracture is noted. 2. No new/acute appearing fracture or bone destruction is seen. 3. No significant joint effusion to suggest septic arthritis though this is of clinical concern, percutaneous sampling of the joint fluid could be attempted. No definite evidence of hardware loosening however. Electronically Signed   By: Ashley Royalty M.D.   On: 10/01/2018 20:08   Dg Chest Port 1 View  Result Date: 10/16/2018 CLINICAL DATA:  PICC line placement. EXAM:  PORTABLE CHEST 1 VIEW COMPARISON:  Radiographs and CT 10/07/2018. FINDINGS: 1223 hours. Left arm PICC tip is not optimally visualized, although appears to extend to the lower SVC level. The heart size and mediastinal contours are stable. There is aortic atherosclerosis. Bilateral pleural thickening and subpleural rounded atelectasis peripherally in the left lung are unchanged. There is no pneumothorax. Glenohumeral degenerative changes are present bilaterally. IMPRESSION: PICC line tip at the lower SVC level. No pneumothorax or acute findings. Electronically Signed   By: Richardean Sale M.D.   On: 10/16/2018 13:03   Dg Chest Port 1 View  Result Date: 10/07/2018 CLINICAL DATA:  Shortness of breath. EXAM: PORTABLE CHEST 1 VIEW COMPARISON:  10/01/2017. FINDINGS: Left lower chest incompletely imaged. Cardiomegaly with diffuse bilateral interstitial prominence and bilateral pleural effusions. Findings have progressed from prior exam are most consistent with CHF. A component of pleural scarring may be present. IMPRESSION: Congestive heart failure with bilateral interstitial edema. Bibasilar pneumonia can not be excluded. Bilateral pleural effusions. A component of pleural scarring may be present. Electronically Signed   By: Marcello Moores  Register   On: 10/07/2018 08:58   Dg Chest Port 1 View  Result Date: 10/01/2018 CLINICAL DATA:  LEFT hip replacement 6 months ago, sepsis, question LEFT leg/hip infection EXAM: PORTABLE CHEST 1 VIEW COMPARISON:  Portable exam 1756 hours compared to 10/01/2018 FINDINGS: RIGHT costophrenic angle excluded. Enlargement of cardiac silhouette. Mediastinal contours and pulmonary vascularity normal. Atherosclerotic calcification aorta. Small LEFT pleural effusion and basilar atelectasis at lower LEFT chest question partially loculated. No definite infiltrate or pneumothorax. Bones demineralized. IMPRESSION: LEFT pleural effusion and basilar atelectasis, question partially locule at the lower  lateral LEFT chest. Electronically Signed   By: Lavonia Dana M.D.   On: 10/01/2018 18:05   Dg Foot 2 Views Left  Result Date: 10/17/2018 CLINICAL DATA:  Pain in left foot. EXAM: LEFT FOOT - 2 VIEW COMPARISON:  None. FINDINGS: Osteopenia. No fractures in the toes or metacarpals. Enthesopathic change at the posterior calcaneus. There is a lucency along the inferior aspect of the calcaneus which does not appear to extend through the calcaneus and is relatively well corticated. No other bony abnormalities. IMPRESSION: 1. There is a lucency along the inferior aspect of the calcaneus which is relatively well corticated and does not appear  to extend through the calcaneus. This is favored to be nonacute. Recommend clinical correlation to exclude pain in this region. Electronically Signed   By: Dorise Bullion III M.D   On: 10/17/2018 15:05   Dg Fluoro Guided Needle Plc Aspiration/injection Loc  Result Date: 10/15/2018 CLINICAL DATA:  Left hip pain, evaluate for fluid collection EXAM: LEFT HIP ASPIRATION UNDER FLUOROSCOPY FLUOROSCOPY TIME:  Fluoroscopy Time:  18 seconds Radiation Exposure Index (if provided by the fluoroscopic device): 2.3 mGy Number of Acquired Spot Images: 1 PROCEDURE: Overlying skin prepped with Betadine, draped in the usual sterile fashion, and infiltrated locally with buffered Lidocaine. 20 gauge spinal needle advanced to the prosthetic junction of the femoral head/neck. 1 ml of Lidocaine injected easily. No fluid/frank infection could be aspirated. 4 mL Lidocaine and 6 mL sterile saline administered into the joint with attempted subsequent aspiration. Less than 1 mL fluid was aspirated and sent for laboratory evaluation. No immediate complication. IMPRESSION: Technically successful left hip aspiration under fluoroscopy, as described above. Electronically Signed   By: Julian Hy M.D.   On: 10/15/2018 11:18   Dg Hips Bilat With Pelvis 2v  Result Date: 10/16/2018 CLINICAL DATA:  Fall.   Pain. EXAM: DG HIP (WITH OR WITHOUT PELVIS) 2V BILAT COMPARISON:  None. FINDINGS: The patient is status post bilateral hip replacements. Visualized hardware is in good position. No acute fractures. IMPRESSION: Bilateral hip replacements.  No acute fractures. Electronically Signed   By: Dorise Bullion III M.D   On: 10/16/2018 20:30   [place] Ekg Site Rite  Result Date: 10/15/2018 If Site Rite image not attached, placement could not be confirmed due to current cardiac rhythm.    LOS: 20 days   Oren Binet, MD  [facility]  If 7PM-7AM, please contact night-coverage  Please page via [email or web address] [email or web address]-click on MD name and type text message  10/24/2018, 10:35 AM

## 2018-10-24 NOTE — Progress Notes (Signed)
Patient refused getting oob from bed to chair today after education on importance of mobility. Encouraged q2 turns to promote skin integrity but patient refused all turns and repositioning. He stated he will let us know when he wants repositioned. Will continue to monitor.  Hiram Comber, RN 10/24/2018 6:44 PM

## 2018-10-25 LAB — GLUCOSE, CAPILLARY: Glucose-Capillary: 94 mg/dL (ref 70–99)

## 2018-10-25 MED ORDER — ZOLPIDEM TARTRATE 5 MG PO TABS
5.0000 mg | ORAL_TABLET | Freq: Once | ORAL | Status: AC
  Administered 2018-10-25: 5 mg via ORAL
  Filled 2018-10-25: qty 1

## 2018-10-25 MED ORDER — PROMETHAZINE HCL 25 MG/ML IJ SOLN
12.5000 mg | Freq: Once | INTRAMUSCULAR | Status: AC
  Administered 2018-10-25: 12.5 mg via INTRAVENOUS
  Filled 2018-10-25: qty 1

## 2018-10-25 NOTE — Progress Notes (Signed)
PROGRESS NOTE        PATIENT DETAILS Name: [name] Age: 73 y.o. Sex: male Date of Birth: 11/27/1945 Admit Date: 10/01/2018 Admitting Physician Vianne Bulls, MD CXK:[id number], Hollice Espy, MD  Brief Narrative: Patient is a 73 y.o. male with history of A. 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  Found to have a soft tissue infection of the left hip area, acute kidney injury and hyperkalemia.  ID, orthopedics consulted-underwent left hip aspiration on 2/14, infectious disease recommending 6 weeks of IV Invanz.   See below for further details  Subjective: Claims he did not sleep well last night-denies any abdominal pain, chest pain, shortness of breath.  Lying comfortably in bed.  Had BM yesterday.  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  Was on suppressive Flagyl for Bacteroides fragilis infection-recommendations from ID are for 6 weeks of IV ertapenem-end date of November 25, 2018.  AKI: Likely hemodynamically mediated-secondary to soft tissue infection of the left hip, diuretic use.  Resolved with supportive care.  Follow electrolytes periodically.  Hyperkalemia: Suspect secondary to AKI with use of Aldactone and potassium supplementation.  Resolved.  History of left total hip arthroplasty with subsequent infection with Bacteroides fragilis:  Patient was evaluated by infectious disease at [facility] was previously on Flagyl.  Now on IV Invanz-Flagyl has been discontinued.  Recurrent pleural effusion: Felt to be secondary to hepatic hydrothorax-most recent CT chest on 10/07/2018 shows a small pleural effusion-patient is asymptomatic at this point.  Doubt further work-up required-furthermore he has had a recent extensive work-up including multiple thoracocentesis   Cryptogenic cirrhosis: Prior hepatitis serology done earlier this month was negative-agree with plans outlined in prior discharge summary for outpatient follow-up.  Volume status is stable-he is awake and alert-continue Aldactone  Atrial fibrillation: Rate controlled with Cardizem.Continue Eliquis.  Chronic back and right shoulder pain: Continue Lidoderm patch, Tylenol and as needed narcotics.  Bipolar disorder: Stable-continue Lexapro, Neurontin  Constipation: better-continue miralax/senokot  OSA: Continue CPAP nightly  Morbid obesity  Acute on chronic debility/deconditioning: Has chronic debility/deconditioning at baseline-this is worsened due to acute illness/AKI/left hip infection.  Per social work-very difficult to place to SNF.  DVT Prophylaxis: Full dose anticoagulation with Eliquis  Code Status: Full code  Family Communication: None at bedside  Disposition Plan: Remain inpatient- SNF on discharge when bed available  Antimicrobial agents: Anti-infectives (From admission, onward)   Start     Dose/Rate Route Frequency Ordered Stop   10/15/18 1500  cefTRIAXone (ROCEPHIN) 2 g in sodium chloride 0.9 % 100 mL IVPB  Status:  Discontinued     2 g 200 mL/hr over 30 Minutes Intravenous Every 24 hours 10/15/18 1434 10/15/18 1447   10/15/18 1500  ertapenem (INVANZ) 1,000 mg in sodium chloride 0.9 % 100 mL IVPB     1 g 200 mL/hr over 30 Minutes  Intravenous Every 24 hours 10/15/18 1447 11/25/18 2359   10/08/18 0000  doxycycline (VIBRA-TABS)  100 MG tablet     100 mg Oral Every 12 hours 10/08/18 0921     10/06/18 1000  doxycycline (VIBRA-TABS) tablet 100 mg  Status:  Discontinued     100 mg Oral Every 12 hours 10/06/18 0726 10/11/18 1328   10/03/18 1800  metroNIDAZOLE (FLAGYL) tablet 500 mg  Status:  Discontinued     500 mg Oral Every 8 hours 10/03/18 1054 10/15/18 1447   10/03/18 0700  vancomycin (VANCOCIN) 2,000 mg in sodium chloride 0.9 % 500 mL IVPB  Status:  Discontinued     2,000 mg 250 mL/hr over 120 Minutes Intravenous Every 36 hours 10/01/18 1942 10/06/18 0726   10/02/18 0200  ceFEPIme (MAXIPIME) 2 g in sodium chloride 0.9 % 100 mL IVPB  Status:  Discontinued     2 g 200 mL/hr over 30 Minutes Intravenous Every 8 hours 10/01/18 1942 10/02/18 1008   10/01/18 1830  vancomycin (VANCOCIN) 2,500 mg in sodium chloride 0.9 % 500 mL IVPB     2,500 mg 250 mL/hr over 120 Minutes Intravenous STAT 10/01/18 1825 10/02/18 0126   10/01/18 1800  ceFEPIme (MAXIPIME) 2 g in sodium chloride 0.9 % 100 mL IVPB     2 g 200 mL/hr over 30 Minutes Intravenous  Once 10/01/18 1747 10/01/18 1903   10/01/18 1800  metroNIDAZOLE (FLAGYL) IVPB 500 mg  Status:  Discontinued     500 mg 100 mL/hr over 60 Minutes Intravenous Every 8 hours 10/01/18 1747 10/03/18 1054   10/01/18 1800  vancomycin (VANCOCIN) IVPB 1000 mg/200 mL premix  Status:  Discontinued     1,000 mg 200 mL/hr over 60 Minutes Intravenous  Once 10/01/18 1747 10/01/18 1825      Procedures: None  CONSULTS:  None  Time spent: 15- minutes-Greater than 50% of this time was spent in counseling, explanation of diagnosis, planning of further management, and coordination of care.  MEDICATIONS: Scheduled Meds: . apixaban  5 mg Oral BID  . cycloSPORINE  1 drop Both Eyes BID  . diltiazem  180 mg Oral Daily  . escitalopram  10 mg Oral Daily  . gabapentin  100 mg Oral TID  . lidocaine  1 patch Transdermal Q24H  . Melatonin  6 mg Oral QHS  . midodrine  10 mg Oral TID WC  . multivitamin  with minerals  1 tablet Oral Daily  . pantoprazole  40 mg Oral Daily  . polyethylene glycol  17 g Oral QHS  . ENSURE MAX PROTEIN  11 oz Oral BID  . senna  2 tablet Oral QHS  . spironolactone  50 mg Oral Daily  . thiamine  100 mg Oral Daily   Continuous Infusions: . sodium chloride 250 mL (10/24/18 0052)  . ertapenem 1,000 mg (10/24/18 1425)   PRN Meds:.sodium chloride, acetaminophen, albuterol, alum & mag hydroxide-simeth, bisacodyl, diltiazem, hydrOXYzine, menthol-cetylpyridinium, [DISCONTINUED] ondansetron **OR** ondansetron (ZOFRAN) IV, oxyCODONE, sodium chloride flush, sorbitol, milk of mag, mineral oil, glycerin (SMOG) enema, traZODone   PHYSICAL EXAM: Vital signs: Vitals:   10/24/18 2200 10/24/18 2253 10/25/18 0445 10/25/18 0920  BP:  103/72 103/74 90/61  Pulse: 97 95 95 94  Resp: 18 16 18    Temp:  98.9 F (37.2 C) 97.9 F (36.6 C)   TempSrc:  Oral Oral   SpO2: 95% 94% 93% 97%  Weight:      Height:       [employer]  10/22/18 0500 10/23/18 0434 10/24/18 0500  Weight: 122.7 kg 122.7 kg 121.9 kg   Body mass index is 40.86 kg/m.   General appearance:Awake, alert, not in any distress.  Eyes:no scleral icterus. HEENT: Atraumatic and Normocephalic Neck: supple, no JVD. Resp:Good air entry bilaterally,no rales or rhonchi CVS: S1 S2 regular, no murmurs.  GI: Bowel sounds present, Non tender and not distended with no gaurding, rigidity or rebound. Extremities: B/L Lower Ext shows no edema, both legs are warm to touch Neurology:  Non focal Musculoskeletal:No digital cyanosis Skin:No Rash, warm and dry Wounds:N/A  LABORATORY DATA: CBC: Recent Labs  Lab 10/19/18 0407 10/20/18 0410 10/22/18 2357  WBC 7.1 7.1 6.6  NEUTROABS 4.4 4.2  --   HGB 10.1* 10.2* 10.1*  HCT 33.7* 34.1* 33.2*  MCV 89.6 90.0 90.2  PLT 274 287 295    Basic Metabolic Panel: Recent Labs  Lab 10/19/18 0407 10/20/18 0410 10/21/18 0319 10/22/18 2357  NA 135 136 134* 135  K 4.7 4.5  5.1 4.7  CL 103 103 105 105  CO2 24 26 20* 26  GLUCOSE 105* 117* 84 115*  BUN 38* 41* 38* 33*  CREATININE 0.95 0.94 1.06 0.96  CALCIUM 9.8 9.7 9.5 9.5  MG 2.0 2.1 2.0  --     GFR: Estimated Creatinine Clearance: 88.3 mL/min (by C-G formula based on SCr of 0.96 mg/dL).  Liver Function Tests: No results for input(s): AST, ALT, ALKPHOS, BILITOT, PROT, ALBUMIN in the last 168 hours. No results for input(s): LIPASE, AMYLASE in the last 168 hours. No results for input(s): AMMONIA in the last 168 hours.  Coagulation Profile: No results for input(s): INR, PROTIME in the last 168 hours.  Cardiac Enzymes: No results for input(s): CKTOTAL, CKMB, CKMBINDEX, TROPONINI in the last 168 hours.  BNP (last 3 results) No results for input(s): PROBNP in the last 8760 hours.  HbA1C: No results for input(s): HGBA1C in the last 72 hours.  CBG: Recent Labs  Lab 10/19/18 0758 10/20/18 0810 10/21/18 0816 10/22/18 0801 10/25/18 0740  GLUCAP 85 81 94 89 94    Lipid Profile: No results for input(s): CHOL, HDL, LDLCALC, TRIG, CHOLHDL, LDLDIRECT in the last 72 hours.  Thyroid Function Tests: No results for input(s): TSH, T4TOTAL, FREET4, T3FREE, THYROIDAB in the last 72 hours.  Anemia Panel: No results for input(s): VITAMINB12, FOLATE, FERRITIN, TIBC, IRON, RETICCTPCT in the last 72 hours.  Urine analysis:    Component Value Date/Time   COLORURINE YELLOW 10/01/2018 1822   APPEARANCEUR CLEAR 10/01/2018 1822   APPEARANCEUR Clear 01/18/2018 1539   LABSPEC 1.010 10/01/2018 1822   PHURINE 5.0 10/01/2018 1822   GLUCOSEU NEGATIVE 10/01/2018 1822   HGBUR NEGATIVE 10/01/2018 1822   BILIRUBINUR NEGATIVE 10/01/2018 1822   BILIRUBINUR Negative 01/18/2018 1539   KETONESUR NEGATIVE 10/01/2018 1822   PROTEINUR NEGATIVE 10/01/2018 1822   UROBILINOGEN 0.2 09/06/2012 0213   NITRITE NEGATIVE 10/01/2018 1822   LEUKOCYTESUR NEGATIVE 10/01/2018 1822   LEUKOCYTESUR Negative 01/18/2018 1539    Sepsis  Labs: Lactic Acid, Venous    Component Value Date/Time   LATICACIDVEN 1.7 10/01/2018 1833    MICROBIOLOGY: No results found for this or any previous visit (from the past 240 hour(s)).  RADIOLOGY STUDIES/RESULTS: Dg Chest 1 View  Result Date: 10/01/2018 CLINICAL DATA:  Follow-up pneumothorax. EXAM: CHEST  1 VIEW COMPARISON:  09/09/2018 FINDINGS: Mild bilateral interstitial thickening. Small bilateral pleural effusions. Left lateral pleural thickening with a subtle lucency which may reflect a small loculated pneumothorax versus  artifact. Right infrahilar hazy airspace disease likely reflecting atelectasis. Stable cardiomediastinal silhouette. No aggressive osseous lesion. Mild osteoarthritis of bilateral glenohumeral joints. IMPRESSION: Left lateral pleural thickening with a subtle lucency which may reflect a small loculated pneumothorax versus artifact. Recommend follow-up chest x-ray for re-evaluation. Bilateral interstitial thickening and small bilateral pleural effusions concerning for mild pulmonary edema. Electronically Signed   By: Kathreen Devoid   On: 10/01/2018 15:38   Dg Knee 1-2 Views Left  Result Date: 10/16/2018 CLINICAL DATA:  Pain after fall EXAM: LEFT KNEE - 1-2 VIEW COMPARISON:  None. FINDINGS: No acute fracture or dislocation. No joint effusion. Moderate to severe degenerative changes. IMPRESSION: Degenerative changes.  No acute fracture noted. Electronically Signed   By: Dorise Bullion III M.D   On: 10/16/2018 20:32   Dg Knee 1-2 Views Right  Result Date: 10/16/2018 CLINICAL DATA:  Pain after fall EXAM: RIGHT KNEE - 1-2 VIEW COMPARISON:  None. FINDINGS: No acute fracture or effusion. Moderate to severe degenerative changes. IMPRESSION: No acute abnormality. Electronically Signed   By: Dorise Bullion III M.D   On: 10/16/2018 20:32   Ct Chest Wo Contrast  Result Date: 10/07/2018 CLINICAL DATA:  Chronic dyspnea. EXAM: CT CHEST WITHOUT CONTRAST TECHNIQUE: Multidetector CT imaging  of the chest was performed following the standard protocol without IV contrast. COMPARISON:  Portable chest obtained earlier today and chest CTA dated 08/17/2018. FINDINGS: Cardiovascular: Atheromatous calcifications, including the coronary arteries and aorta. Stable borderline enlargement of the heart with mild biatrial enlargement. No pericardial effusion. Mediastinum/Nodes: No enlarged mediastinal or axillary lymph nodes. Thyroid gland, trachea, and esophagus demonstrate no significant findings. Lungs/Pleura: Small to moderate-sized right pleural effusion. Stable left pleural thickening with calcifications. Mild bilateral atelectasis and scarring. Upper Abdomen: Post gastric bypass changes. Musculoskeletal: Mild dextroconvex thoracolumbar scoliosis. Extensive thoracic and lower cervical spine degenerative changes. IMPRESSION: 1. Small to moderate-sized right pleural effusion. 2. Mild bilateral atelectasis and scarring. 3. 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 4.  Calcific coronary artery and aortic atherosclerosis. Aortic Atherosclerosis (ICD10-I70.0). Electronically Signed   By: Claudie Revering M.D.   On: 10/07/2018 20:31   Ct Hip Left W Contrast  Result Date: 10/01/2018 CLINICAL DATA:  Left hip pain EXAM: CT OF THE LOWER LEFT EXTREMITY WITH CONTRAST TECHNIQUE: Multidetector CT imaging of the lower left extremity was performed according to the standard protocol following intravenous contrast administration. COMPARISON:  09/03/2018 CONTRAST:  145mL OMNIPAQUE IOHEXOL 300 MG/ML  SOLN FINDINGS: Bones/Joint/Cartilage An uncemented left total hip arthroplasty is identified with the tip traversing a left femoral diaphyseal fracture that is incompletely healed. Subtle residual fracture lucencies are suggested on the coronal reformats, series 6/46 through 48 for instance. There is callus formation developing about the fracture, series 3/143. Slight residual varus  configuration about the fracture is seen of the femur. No new/acute appearing fracture or bone destruction is seen. With reference to the hip joint, no significant joint effusion or abnormal lucencies to suggest loosening. The prosthetic femoral head is seated within the acetabular component without evidence of asymmetric lining wear. No soft tissue mass or mineralization. No hematoma or abnormal fluid collections. Osteoarthritis of the included SI joints and pubic symphysis. No diastasis. Ligaments Suboptimally assessed by CT. Muscles and Tendons Negative for muscle edema, hematoma or definite tear. Soft tissues No soft tissue mass, mineralization or fluid collections. Postop scarring along the lateral aspect of the hip. IMPRESSION: 1. An uncemented left total hip arthroplasty is identified with the tip  traversing a left femoral diaphyseal fracture that is incompletely healed. Subtle residual fracture lucencies are suggested on the coronal reformats, series 6/46 through 48. Slight residual varus configuration of the femur about the fracture is noted. 2. No new/acute appearing fracture or bone destruction is seen. 3. No significant joint effusion to suggest septic arthritis though this is of clinical concern, percutaneous sampling of the joint fluid could be attempted. No definite evidence of hardware loosening however. Electronically Signed   By: Ashley Royalty M.D.   On: 10/01/2018 20:08   Dg Chest Port 1 View  Result Date: 10/16/2018 CLINICAL DATA:  PICC line placement. EXAM: PORTABLE CHEST 1 VIEW COMPARISON:  Radiographs and CT 10/07/2018. FINDINGS: 1223 hours. Left arm PICC tip is not optimally visualized, although appears to extend to the lower SVC level. The heart size and mediastinal contours are stable. There is aortic atherosclerosis. Bilateral pleural thickening and subpleural rounded atelectasis peripherally in the left lung are unchanged. There is no pneumothorax. Glenohumeral degenerative changes are  present bilaterally. IMPRESSION: PICC line tip at the lower SVC level. No pneumothorax or acute findings. Electronically Signed   By: Richardean Sale M.D.   On: 10/16/2018 13:03   Dg Chest Port 1 View  Result Date: 10/07/2018 CLINICAL DATA:  Shortness of breath. EXAM: PORTABLE CHEST 1 VIEW COMPARISON:  10/01/2017. FINDINGS: Left lower chest incompletely imaged. Cardiomegaly with diffuse bilateral interstitial prominence and bilateral pleural effusions. Findings have progressed from prior exam are most consistent with CHF. A component of pleural scarring may be present. IMPRESSION: Congestive heart failure with bilateral interstitial edema. Bibasilar pneumonia can not be excluded. Bilateral pleural effusions. A component of pleural scarring may be present. Electronically Signed   By: Marcello Moores  Register   On: 10/07/2018 08:58   Dg Chest Port 1 View  Result Date: 10/01/2018 CLINICAL DATA:  LEFT hip replacement 6 months ago, sepsis, question LEFT leg/hip infection EXAM: PORTABLE CHEST 1 VIEW COMPARISON:  Portable exam 1756 hours compared to 10/01/2018 FINDINGS: RIGHT costophrenic angle excluded. Enlargement of cardiac silhouette. Mediastinal contours and pulmonary vascularity normal. Atherosclerotic calcification aorta. Small LEFT pleural effusion and basilar atelectasis at lower LEFT chest question partially loculated. No definite infiltrate or pneumothorax. Bones demineralized. IMPRESSION: LEFT pleural effusion and basilar atelectasis, question partially locule at the lower lateral LEFT chest. Electronically Signed   By: Lavonia Dana M.D.   On: 10/01/2018 18:05   Dg Foot 2 Views Left  Result Date: 10/17/2018 CLINICAL DATA:  Pain in left foot. EXAM: LEFT FOOT - 2 VIEW COMPARISON:  None. FINDINGS: Osteopenia. No fractures in the toes or metacarpals. Enthesopathic change at the posterior calcaneus. There is a lucency along the inferior aspect of the calcaneus which does not appear to extend through the calcaneus  and is relatively well corticated. No other bony abnormalities. IMPRESSION: 1. There is a lucency along the inferior aspect of the calcaneus which is relatively well corticated and does not appear to extend through the calcaneus. This is favored to be nonacute. Recommend clinical correlation to exclude pain in this region. Electronically Signed   By: Dorise Bullion III M.D   On: 10/17/2018 15:05   Dg Fluoro Guided Needle Plc Aspiration/injection Loc  Result Date: 10/15/2018 CLINICAL DATA:  Left hip pain, evaluate for fluid collection EXAM: LEFT HIP ASPIRATION UNDER FLUOROSCOPY FLUOROSCOPY TIME:  Fluoroscopy Time:  18 seconds Radiation Exposure Index (if provided by the fluoroscopic device): 2.3 mGy Number of Acquired Spot Images: 1 PROCEDURE: Overlying skin prepped with Betadine, draped  in the usual sterile fashion, and infiltrated locally with buffered Lidocaine. 20 gauge spinal needle advanced to the prosthetic junction of the femoral head/neck. 1 ml of Lidocaine injected easily. No fluid/frank infection could be aspirated. 4 mL Lidocaine and 6 mL sterile saline administered into the joint with attempted subsequent aspiration. Less than 1 mL fluid was aspirated and sent for laboratory evaluation. No immediate complication. IMPRESSION: Technically successful left hip aspiration under fluoroscopy, as described above. Electronically Signed   By: Julian Hy M.D.   On: 10/15/2018 11:18   Dg Hips Bilat With Pelvis 2v  Result Date: 10/16/2018 CLINICAL DATA:  Fall.  Pain. EXAM: DG HIP (WITH OR WITHOUT PELVIS) 2V BILAT COMPARISON:  None. FINDINGS: The patient is status post bilateral hip replacements. Visualized hardware is in good position. No acute fractures. IMPRESSION: Bilateral hip replacements.  No acute fractures. Electronically Signed   By: Dorise Bullion III M.D   On: 10/16/2018 20:30   [place] Ekg Site Rite  Result Date: 10/15/2018 If Site Rite image not attached, placement could not be confirmed  due to current cardiac rhythm.    LOS: 21 days   Oren Binet, MD  [facility]  If 7PM-7AM, please contact night-coverage  Please page via [email or web address]-Password [email or web address]-click on MD name and type text message  10/25/2018, 10:44 AM

## 2018-10-25 NOTE — Progress Notes (Signed)
Patient remains on Difficult to Place waitlist.   Cedric Fishman LCSW [phone]

## 2018-10-25 NOTE — Progress Notes (Addendum)
Patient stated had a large BM today and was nauseated after. Dayshift RN administered Zofran around 1530 and at shift change he stated it was not effective and requested Phenergan. Patient denied any vomiting. Have notified Dr. Hilbert Bible of the above. Will await advice/orders from MD and continue to monitor patient.   Orders received for phenergan. Thank you

## 2018-10-25 NOTE — Progress Notes (Signed)
Nutrition Follow-up  DOCUMENTATION CODES:   Morbid obesity  INTERVENTION:   - Continue Ensure Max po BID, each supplement provides 150 kcal and 30 grams of protein  - Continue MVI with minerals daily  NUTRITION DIAGNOSIS:   Increased nutrient needs related to chronic illness (cirrhosis) as evidenced by estimated needs.  Ongoing  GOAL:   Patient will meet greater than or equal to 90% of their needs  Progressing  MONITOR:   PO intake, Supplement acceptance, Labs, Weight trends, Skin, I & O's  REASON FOR ASSESSMENT:   Malnutrition Screening Tool    ASSESSMENT:   73 y.o. male with medical history significant for chronic back and right shoulder pain, OSA on CPAP, paroxysmal atrial fibrillation on Eliquis, hepatic cirrhosis, recurrent pleural effusions, and status post left total hip arthroplasty complicated by infection  2/14 - s/p IR aspiration of hip joint 2/15 - PICC placed  Pt remains on difficult to place SNF list per CSW.  Overall, weight down 4 lbs since measured weight on 10/05/18.  Attempted to speak with pt at bedside but pt was sleeping. Discussed pt with RN who reports pt is eating well at meals and accepting Ensure Max oral nutrition supplements. RN denies any nutrition-related issues at this time.  Meal Completion: 50-100% x 8 meals (average 84%)  Medications reviewed and include: MVI with minerals daily, Protonix, Miralax, Ensure Max BID, Senna, thiamine, IV antibiotics  Labs reviewed: BUN 33 (H) CBG: 94  Diet Order:   Diet Order            Diet 2 gram sodium Room service appropriate? Yes; Fluid consistency: Thin  Diet effective now        Diet - low sodium heart healthy              EDUCATION NEEDS:   Education needs have been addressed  Skin:  Skin Assessment: Skin Integrity Issues: Incisions: L hip Other: non-pressure wound to R buttocks  Last BM:  2/23  Height:   Ht Readings from Last 1 Encounters:  10/01/18 5\' 8"  (1.727 m)     Weight:   Wt Readings from Last 1 Encounters:  10/24/18 121.9 kg    Ideal Body Weight:  70 kg  BMI:  Body mass index is 40.86 kg/m.  Estimated Nutritional Needs:   Kcal:  3846-6599  Protein:  125-140 grams  Fluid:  2.2-2.4 L    Gaynell Face, MS, RD, LDN Inpatient Clinical Dietitian Pager: [phone] Weekend/After Hours: [phone]

## 2018-10-26 LAB — GLUCOSE, CAPILLARY: Glucose-Capillary: 97 mg/dL (ref 70–99)

## 2018-10-26 MED ORDER — ZOLPIDEM TARTRATE 5 MG PO TABS: 5.0000 mg | ORAL_TABLET | Freq: Every evening | ORAL | Status: DC | PRN

## 2018-10-26 MED ORDER — POLYETHYLENE GLYCOL 3350 17 G PO PACK
17.0000 g | PACK | Freq: Two times a day (BID) | ORAL | Status: DC
  Administered 2018-10-26 – 2018-10-27 (×3): 17 g via ORAL
  Filled 2018-10-26 (×3): qty 1

## 2018-10-26 MED ORDER — ZOLPIDEM TARTRATE 5 MG PO TABS
5.0000 mg | ORAL_TABLET | Freq: Every evening | ORAL | Status: DC | PRN
  Administered 2018-10-26 – 2018-11-17 (×23): 5 mg via ORAL
  Filled 2018-10-26 (×23): qty 1

## 2018-10-26 MED ORDER — LACTULOSE 10 GM/15ML PO SOLN
30.0000 g | Freq: Every day | ORAL | Status: DC
  Administered 2018-10-27: 30 g via ORAL
  Filled 2018-10-26: qty 45

## 2018-10-26 NOTE — Progress Notes (Signed)
Orthopedic Tech Progress Note Patient Details:  [name] 01-21-1946 [id number]  Ortho Devices Type of Ortho Device: Postop shoe/boot Ortho Device/Splint Interventions: Ordered, Application, Adjustment   Post Interventions Patient Tolerated: Well Instructions Provided: Adjustment of device, Care of device   Marcio Hoque J Matson Welch 10/26/2018, 12:16 PM

## 2018-10-26 NOTE — Progress Notes (Signed)
Patient declined CPAP at this time, no distress noted.  

## 2018-10-26 NOTE — Progress Notes (Signed)
Physical Therapy Treatment Patient Details Name: [name] MRN: [id number] DOB: 1945/11/27 Today's Date: 10/26/2018    History of Present Illness Pt is a 73 year old male with PMHx significant for HTN, afib on Eliquis, OSA, morbid obesity, L THA and hip infection and history of multiple recent prolonged hospitalizations.  Pt presented to ED with generalized weakness, falls, and shortness of breath- work-up in the emergency room showed A. fib heart rate 108 and a recurrent right-sided pleural effusion    PT Comments    Pt too fearful of falling and pain to give much effort to assist.  Emphasis on transitioning to EOB, sitting balance, sit to stand trials and transfers.  Limited significantly by pt fear.    Follow Up Recommendations  SNF     Equipment Recommendations  None recommended by PT    Recommendations for Other Services       Precautions / Restrictions Precautions Precautions: Fall    Mobility  Bed Mobility Overal bed mobility: Needs Assistance Bed Mobility: Supine to Sit     Supine to sit: Mod assist;+2 for physical assistance     General bed mobility comments: Minimal initiation of movement.  Truncal assist up via L elbow, Moderate truncal assist during scoot to EOB with bil UE's  Transfers Overall transfer level: Needs assistance Equipment used: Rolling walker (2 wheeled);None Transfers: Sit to/from [name] Sit to Stand: Mod assist;+2 physical assistance(x3)   Squat pivot transfers: Min assist;+2 physical assistance     General transfer comment: cues for hand placement.  cues for hand placement.  Assist to help pt come forward.  Stability assist and support given in attempt to get pt upright.  Pt eventually panics and wants to sit down.  Similar scenarios x3 attempts.  Ambulation/Gait                 Stairs             Wheelchair Mobility    Modified Rankin (Stroke Patients Only)       Balance Overall balance  assessment: Needs assistance Sitting-balance support: Feet supported;Bilateral upper extremity supported;Single extremity supported Sitting balance-[name]: Fair Sitting balance - Comments: but preferred to have his UE assist     Standing balance-[name]: Poor Standing balance comment: heavy reliance on the RW                            Cognition Arousal/Alertness: Awake/alert Behavior During Therapy: Anxious Overall Cognitive Status: Within Functional Limits for tasks assessed                                        Exercises Other Exercises Other Exercises: bil LE warm up for hips/knees x10 reps.    General Comments        Pertinent Vitals/Pain Pain Assessment: Faces Faces Pain Scale: Hurts little more Pain Location: back, Legs with ROM Pain Descriptors / Indicators: Grimacing;Guarding Pain Intervention(s): Monitored during session    Home Living                      Prior Function            PT Goals (current goals can now be found in the care plan section) Acute Rehab PT Goals Patient Stated Goal: to get stronger and get home PT Goal Formulation: (  little improvement noted.) Time For Goal Achievement: 10/30/18 Potential to Achieve Goals: Fair Progress towards PT goals: Not progressing toward goals - comment    Frequency    Min 2X/week      PT Plan Current plan remains appropriate    Co-evaluation              AM-PAC PT "6 Clicks" Mobility   Outcome Measure  Help needed turning from your back to your side while in a flat bed without using bedrails?: A Lot Help needed moving from lying on your back to sitting on the side of a flat bed without using bedrails?: A Lot Help needed moving to and from a bed to a chair (including a wheelchair)?: A Lot Help needed standing up from a chair using your arms (e.g., wheelchair or bedside chair)?: A Lot Help needed to walk in hospital room?: Total Help needed  climbing 3-5 steps with a railing? : Total 6 Click Score: 10    End of Session Equipment Utilized During Treatment: Gait belt Activity Tolerance: Other (comment);Patient tolerated treatment well Patient left: in bed;with call bell/phone within reach Nurse Communication: Mobility status PT Visit Diagnosis: Other abnormalities of gait and mobility (R26.89);Muscle weakness (generalized) (M62.81);Difficulty in walking, not elsewhere classified (R26.2);History of falling (Z91.81)     Time: 5183-3582 PT Time Calculation (min) (ACUTE ONLY): 26 min  Charges:  $Therapeutic Exercise: 8-22 mins $Therapeutic Activity: 8-22 mins                     10/26/2018  [name], PT Acute Rehabilitation Services [phone]  (pager) [phone]  (office)   [name] 10/26/2018, 1:56 PM

## 2018-10-26 NOTE — Progress Notes (Signed)
PROGRESS NOTE        PATIENT DETAILS Name: [name] Age: 73 y.o. Sex: male Date of Birth: 1946-07-24 Admit Date: 10/01/2018 Admitting Physician Vianne Bulls, MD IEP:PIRJJO, Hollice Espy, MD  Brief Narrative: Patient is a 73 y.o. male with history of A. 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  Found to have a soft tissue infection of the left hip area, acute kidney injury and hyperkalemia.  ID, orthopedics consulted-underwent left hip aspiration on 2/14, infectious disease recommending 6 weeks of IV Invanz.   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  Was on suppressive Flagyl for Bacteroides fragilis infection-recommendations from ID are for 6 weeks of IV ertapenem-end date of November 25, 2018.  AKI: Likely hemodynamically mediated-secondary to soft tissue infection of the left hip, diuretic use.  Resolved with supportive care.  Follow electrolytes periodically.  Hyperkalemia: Suspect secondary to AKI with use of Aldactone and potassium supplementation.  Resolved.  History of left total hip arthroplasty with subsequent infection with Bacteroides fragilis: Patient was evaluated by infectious disease at [facility] was previously on Flagyl.  Now on IV  Invanz-Flagyl has been discontinued.  Recurrent pleural effusion: Felt to be secondary to hepatic hydrothorax-most recent CT chest on 10/07/2018 shows a small pleural effusion-patient is asymptomatic at this point.  Doubt further work-up required-furthermore he has had a recent extensive work-up including multiple thoracocentesis   Cryptogenic cirrhosis: Prior hepatitis serology done earlier this month was negative-agree with plans outlined in prior discharge summary for outpatient follow-up.  Volume status is stable-he is awake and alert-continue Aldactone  Atrial fibrillation: Rate controlled with Cardizem.Continue Eliquis.  Chronic back and right shoulder pain: Continue Lidoderm patch, Tylenol and as needed narcotics.  Bipolar disorder: Stable-continue Lexapro, Neurontin  Constipation: Required enema x1 yesterday-change MiraLAX to twice daily, continue senna  OSA: Continue CPAP nightly  Morbid obesity  Acute on chronic debility/deconditioning: Has chronic debility/deconditioning at baseline-this is worsened due to acute illness/AKI/left hip infection.  Per social work-very difficult to place to SNF.  DVT Prophylaxis: Full dose anticoagulation with Eliquis  Code Status: Full code  Family Communication: None at bedside  Disposition Plan: Remain inpatient- SNF on discharge when bed available  Antimicrobial agents: Anti-infectives (From admission, onward)   Start     Dose/Rate Route Frequency Ordered Stop   10/15/18 1500  cefTRIAXone (ROCEPHIN) 2 g in sodium chloride 0.9 % 100 mL IVPB  Status:  Discontinued     2 g 200 mL/hr over 30 Minutes Intravenous Every 24 hours 10/15/18 1434 10/15/18 1447   10/15/18 1500  ertapenem (INVANZ) 1,000 mg in sodium chloride 0.9 % 100 mL IVPB     1 g 200 mL/hr over 30 Minutes Intravenous Every 24 hours 10/15/18 1447 11/25/18 2359   10/08/18 0000  doxycycline (VIBRA-TABS) 100 MG tablet     100 mg Oral Every 12 hours 10/08/18 0921     10/06/18  1000  doxycycline (VIBRA-TABS) tablet 100 mg  Status:  Discontinued     100 mg Oral Every 12 hours 10/06/18 0726 10/11/18 1328   10/03/18 1800  metroNIDAZOLE (FLAGYL) tablet 500 mg  Status:  Discontinued     500 mg Oral Every 8 hours 10/03/18 1054 10/15/18 1447   10/03/18 0700  vancomycin (VANCOCIN) 2,000 mg in sodium chloride 0.9 % 500 mL IVPB  Status:  Discontinued     2,000 mg 250 mL/hr over 120 Minutes Intravenous Every 36 hours 10/01/18 1942 10/06/18 0726   10/02/18 0200  ceFEPIme (MAXIPIME) 2 g in sodium chloride 0.9 % 100 mL IVPB  Status:  Discontinued     2 g 200 mL/hr over 30 Minutes Intravenous Every 8 hours 10/01/18 1942 10/02/18 1008   10/01/18 1830  vancomycin (VANCOCIN) 2,500 mg in sodium chloride 0.9 % 500 mL IVPB     2,500 mg 250 mL/hr over 120 Minutes Intravenous STAT 10/01/18 1825 10/02/18 0126   10/01/18 1800  ceFEPIme (MAXIPIME) 2 g in sodium chloride 0.9 % 100 mL IVPB     2 g 200 mL/hr over 30 Minutes Intravenous  Once 10/01/18 1747 10/01/18 1903   10/01/18 1800  metroNIDAZOLE (FLAGYL) IVPB 500 mg  Status:  Discontinued     500 mg 100 mL/hr over 60 Minutes Intravenous Every 8 hours 10/01/18 1747 10/03/18 1054   10/01/18 1800  vancomycin (VANCOCIN) IVPB 1000 mg/200 mL premix  Status:  Discontinued     1,000 mg 200 mL/hr over 60 Minutes Intravenous  Once 10/01/18 1747 10/01/18 1825      Procedures: None  CONSULTS:  None  Time spent: 15- minutes-Greater than 50% of this time was spent in counseling, explanation of diagnosis, planning of further management, and coordination of care.  MEDICATIONS: Scheduled Meds: . apixaban  5 mg Oral BID  . cycloSPORINE  1 drop Both Eyes BID  . diltiazem  180 mg Oral Daily  . escitalopram  10 mg Oral Daily  . gabapentin  100 mg Oral TID  . lidocaine  1 patch Transdermal Q24H  . Melatonin  6 mg Oral QHS  . midodrine  10 mg Oral TID WC  . multivitamin with minerals  1 tablet Oral Daily  . pantoprazole  40 mg Oral Daily  .  polyethylene glycol  17 g Oral BID  . ENSURE MAX PROTEIN  11 oz Oral BID  . senna  2 tablet Oral QHS  . spironolactone  50 mg Oral Daily  . thiamine  100 mg Oral Daily   Continuous Infusions: . sodium chloride 250 mL (10/24/18 0052)  . ertapenem 1,000 mg (10/25/18 1530)   PRN Meds:.sodium chloride, acetaminophen, albuterol, alum & mag hydroxide-simeth, bisacodyl, diltiazem, hydrOXYzine, menthol-cetylpyridinium, [DISCONTINUED] ondansetron **OR** ondansetron (ZOFRAN) IV, oxyCODONE, sodium chloride flush, traZODone   PHYSICAL EXAM: Vital signs: Vitals:   10/25/18 0920 10/25/18 1229 10/25/18 2107 10/26/18 1000  BP: 90/61 103/78 115/79   Pulse: 94 92 100   Resp:  20 18   Temp:  97.9 F (36.6 C) 98.2 F (36.8 C)   TempSrc:  Oral Oral   SpO2: 97% 98% 95%   Weight:    119.8 kg  Height:       Filed Weights   10/23/18 0434 10/24/18 0500 10/26/18 1000  Weight: 122.7 kg 121.9 kg 119.8 kg   Body mass index  is 40.16 kg/m.   General appearance:Awake, alert, not in any distress.  Eyes:no scleral icterus. HEENT: Atraumatic and Normocephalic Neck: supple, no JVD. Resp:Good air entry bilaterally,no rales or rhonchi CVS: S1 S2 regular, no murmurs.  GI: Bowel sounds present, Non tender and not distended with no gaurding, rigidity or rebound. Extremities: B/L Lower Ext shows no edema, both legs are warm to touch Neurology:  Non focal Musculoskeletal:No digital cyanosis Skin:No Rash, warm and dry Wounds:N/A  LABORATORY DATA: CBC: Recent Labs  Lab 10/20/18 0410 10/22/18 2357  WBC 7.1 6.6  NEUTROABS 4.2  --   HGB 10.2* 10.1*  HCT 34.1* 33.2*  MCV 90.0 90.2  PLT 287 431    Basic Metabolic Panel: Recent Labs  Lab 10/20/18 0410 10/21/18 0319 10/22/18 2357  NA 136 134* 135  K 4.5 5.1 4.7  CL 103 105 105  CO2 26 20* 26  GLUCOSE 117* 84 115*  BUN 41* 38* 33*  CREATININE 0.94 1.06 0.96  CALCIUM 9.7 9.5 9.5  MG 2.1 2.0  --     GFR: Estimated Creatinine Clearance: 87.6  mL/min (by C-G formula based on SCr of 0.96 mg/dL).  Liver Function Tests: No results for input(s): AST, ALT, ALKPHOS, BILITOT, PROT, ALBUMIN in the last 168 hours. No results for input(s): LIPASE, AMYLASE in the last 168 hours. No results for input(s): AMMONIA in the last 168 hours.  Coagulation Profile: No results for input(s): INR, PROTIME in the last 168 hours.  Cardiac Enzymes: No results for input(s): CKTOTAL, CKMB, CKMBINDEX, TROPONINI in the last 168 hours.  BNP (last 3 results) No results for input(s): PROBNP in the last 8760 hours.  HbA1C: No results for input(s): HGBA1C in the last 72 hours.  CBG: Recent Labs  Lab 10/20/18 0810 10/21/18 0816 10/22/18 0801 10/25/18 0740 10/26/18 0809  GLUCAP 81 94 89 94 97    Lipid Profile: No results for input(s): CHOL, HDL, LDLCALC, TRIG, CHOLHDL, LDLDIRECT in the last 72 hours.  Thyroid Function Tests: No results for input(s): TSH, T4TOTAL, FREET4, T3FREE, THYROIDAB in the last 72 hours.  Anemia Panel: No results for input(s): VITAMINB12, FOLATE, FERRITIN, TIBC, IRON, RETICCTPCT in the last 72 hours.  Urine analysis:    Component Value Date/Time   COLORURINE YELLOW 10/01/2018 1822   APPEARANCEUR CLEAR 10/01/2018 1822   APPEARANCEUR Clear 01/18/2018 1539   LABSPEC 1.010 10/01/2018 1822   PHURINE 5.0 10/01/2018 1822   GLUCOSEU NEGATIVE 10/01/2018 1822   HGBUR NEGATIVE 10/01/2018 1822   BILIRUBINUR NEGATIVE 10/01/2018 1822   BILIRUBINUR Negative 01/18/2018 1539   KETONESUR NEGATIVE 10/01/2018 1822   PROTEINUR NEGATIVE 10/01/2018 1822   UROBILINOGEN 0.2 09/06/2012 0213   NITRITE NEGATIVE 10/01/2018 1822   LEUKOCYTESUR NEGATIVE 10/01/2018 1822   LEUKOCYTESUR Negative 01/18/2018 1539    Sepsis Labs: Lactic Acid, Venous    Component Value Date/Time   LATICACIDVEN 1.7 10/01/2018 1833    MICROBIOLOGY: No results found for this or any previous visit (from the past 240 hour(s)).  RADIOLOGY STUDIES/RESULTS: Dg  Chest 1 View  Result Date: 10/01/2018 CLINICAL DATA:  Follow-up pneumothorax. EXAM: CHEST  1 VIEW COMPARISON:  09/09/2018 FINDINGS: Mild bilateral interstitial thickening. Small bilateral pleural effusions. Left lateral pleural thickening with a subtle lucency which may reflect a small loculated pneumothorax versus artifact. Right infrahilar hazy airspace disease likely reflecting atelectasis. Stable cardiomediastinal silhouette. No aggressive osseous lesion. Mild osteoarthritis of bilateral glenohumeral joints. IMPRESSION: Left lateral pleural thickening with a subtle lucency which may reflect a small loculated pneumothorax  versus artifact. Recommend follow-up chest x-ray for re-evaluation. Bilateral interstitial thickening and small bilateral pleural effusions concerning for mild pulmonary edema. Electronically Signed   By: Kathreen Devoid   On: 10/01/2018 15:38   Dg Knee 1-2 Views Left  Result Date: 10/16/2018 CLINICAL DATA:  Pain after fall EXAM: LEFT KNEE - 1-2 VIEW COMPARISON:  None. FINDINGS: No acute fracture or dislocation. No joint effusion. Moderate to severe degenerative changes. IMPRESSION: Degenerative changes.  No acute fracture noted. Electronically Signed   By: Dorise Bullion III M.D   On: 10/16/2018 20:32   Dg Knee 1-2 Views Right  Result Date: 10/16/2018 CLINICAL DATA:  Pain after fall EXAM: RIGHT KNEE - 1-2 VIEW COMPARISON:  None. FINDINGS: No acute fracture or effusion. Moderate to severe degenerative changes. IMPRESSION: No acute abnormality. Electronically Signed   By: Dorise Bullion III M.D   On: 10/16/2018 20:32   Ct Chest Wo Contrast  Result Date: 10/07/2018 CLINICAL DATA:  Chronic dyspnea. EXAM: CT CHEST WITHOUT CONTRAST TECHNIQUE: Multidetector CT imaging of the chest was performed following the standard protocol without IV contrast. COMPARISON:  Portable chest obtained earlier today and chest CTA dated 08/17/2018. FINDINGS: Cardiovascular: Atheromatous calcifications,  including the coronary arteries and aorta. Stable borderline enlargement of the heart with mild biatrial enlargement. No pericardial effusion. Mediastinum/Nodes: No enlarged mediastinal or axillary lymph nodes. Thyroid gland, trachea, and esophagus demonstrate no significant findings. Lungs/Pleura: Small to moderate-sized right pleural effusion. Stable left pleural thickening with calcifications. Mild bilateral atelectasis and scarring. Upper Abdomen: Post gastric bypass changes. Musculoskeletal: Mild dextroconvex thoracolumbar scoliosis. Extensive thoracic and lower cervical spine degenerative changes. IMPRESSION: 1. Small to moderate-sized right pleural effusion. 2. Mild bilateral atelectasis and scarring. 3. 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 4.  Calcific coronary artery and aortic atherosclerosis. Aortic Atherosclerosis (ICD10-I70.0). Electronically Signed   By: Claudie Revering M.D.   On: 10/07/2018 20:31   Ct Hip Left W Contrast  Result Date: 10/01/2018 CLINICAL DATA:  Left hip pain EXAM: CT OF THE LOWER LEFT EXTREMITY WITH CONTRAST TECHNIQUE: Multidetector CT imaging of the lower left extremity was performed according to the standard protocol following intravenous contrast administration. COMPARISON:  09/03/2018 CONTRAST:  197mL OMNIPAQUE IOHEXOL 300 MG/ML  SOLN FINDINGS: Bones/Joint/Cartilage An uncemented left total hip arthroplasty is identified with the tip traversing a left femoral diaphyseal fracture that is incompletely healed. Subtle residual fracture lucencies are suggested on the coronal reformats, series 6/46 through 48 for instance. There is callus formation developing about the fracture, series 3/143. Slight residual varus configuration about the fracture is seen of the femur. No new/acute appearing fracture or bone destruction is seen. With reference to the hip joint, no significant joint effusion or abnormal lucencies to suggest  loosening. The prosthetic femoral head is seated within the acetabular component without evidence of asymmetric lining wear. No soft tissue mass or mineralization. No hematoma or abnormal fluid collections. Osteoarthritis of the included SI joints and pubic symphysis. No diastasis. Ligaments Suboptimally assessed by CT. Muscles and Tendons Negative for muscle edema, hematoma or definite tear. Soft tissues No soft tissue mass, mineralization or fluid collections. Postop scarring along the lateral aspect of the hip. IMPRESSION: 1. An uncemented left total hip arthroplasty is identified with the tip traversing a left femoral diaphyseal fracture that is incompletely healed. Subtle residual fracture lucencies are suggested on the coronal reformats, series 6/46 through 48. Slight residual varus configuration of the femur about the fracture is noted. 2. No  new/acute appearing fracture or bone destruction is seen. 3. No significant joint effusion to suggest septic arthritis though this is of clinical concern, percutaneous sampling of the joint fluid could be attempted. No definite evidence of hardware loosening however. Electronically Signed   By: Ashley Royalty M.D.   On: 10/01/2018 20:08   Dg Chest Port 1 View  Result Date: 10/16/2018 CLINICAL DATA:  PICC line placement. EXAM: PORTABLE CHEST 1 VIEW COMPARISON:  Radiographs and CT 10/07/2018. FINDINGS: 1223 hours. Left arm PICC tip is not optimally visualized, although appears to extend to the lower SVC level. The heart size and mediastinal contours are stable. There is aortic atherosclerosis. Bilateral pleural thickening and subpleural rounded atelectasis peripherally in the left lung are unchanged. There is no pneumothorax. Glenohumeral degenerative changes are present bilaterally. IMPRESSION: PICC line tip at the lower SVC level. No pneumothorax or acute findings. Electronically Signed   By: Richardean Sale M.D.   On: 10/16/2018 13:03   Dg Chest Port 1 View  Result  Date: 10/07/2018 CLINICAL DATA:  Shortness of breath. EXAM: PORTABLE CHEST 1 VIEW COMPARISON:  10/01/2017. FINDINGS: Left lower chest incompletely imaged. Cardiomegaly with diffuse bilateral interstitial prominence and bilateral pleural effusions. Findings have progressed from prior exam are most consistent with CHF. A component of pleural scarring may be present. IMPRESSION: Congestive heart failure with bilateral interstitial edema. Bibasilar pneumonia can not be excluded. Bilateral pleural effusions. A component of pleural scarring may be present. Electronically Signed   By: Marcello Moores  Register   On: 10/07/2018 08:58   Dg Chest Port 1 View  Result Date: 10/01/2018 CLINICAL DATA:  LEFT hip replacement 6 months ago, sepsis, question LEFT leg/hip infection EXAM: PORTABLE CHEST 1 VIEW COMPARISON:  Portable exam 1756 hours compared to 10/01/2018 FINDINGS: RIGHT costophrenic angle excluded. Enlargement of cardiac silhouette. Mediastinal contours and pulmonary vascularity normal. Atherosclerotic calcification aorta. Small LEFT pleural effusion and basilar atelectasis at lower LEFT chest question partially loculated. No definite infiltrate or pneumothorax. Bones demineralized. IMPRESSION: LEFT pleural effusion and basilar atelectasis, question partially locule at the lower lateral LEFT chest. Electronically Signed   By: Lavonia Dana M.D.   On: 10/01/2018 18:05   Dg Foot 2 Views Left  Result Date: 10/17/2018 CLINICAL DATA:  Pain in left foot. EXAM: LEFT FOOT - 2 VIEW COMPARISON:  None. FINDINGS: Osteopenia. No fractures in the toes or metacarpals. Enthesopathic change at the posterior calcaneus. There is a lucency along the inferior aspect of the calcaneus which does not appear to extend through the calcaneus and is relatively well corticated. No other bony abnormalities. IMPRESSION: 1. There is a lucency along the inferior aspect of the calcaneus which is relatively well corticated and does not appear to extend  through the calcaneus. This is favored to be nonacute. Recommend clinical correlation to exclude pain in this region. Electronically Signed   By: Dorise Bullion III M.D   On: 10/17/2018 15:05   Dg Fluoro Guided Needle Plc Aspiration/injection Loc  Result Date: 10/15/2018 CLINICAL DATA:  Left hip pain, evaluate for fluid collection EXAM: LEFT HIP ASPIRATION UNDER FLUOROSCOPY FLUOROSCOPY TIME:  Fluoroscopy Time:  18 seconds Radiation Exposure Index (if provided by the fluoroscopic device): 2.3 mGy Number of Acquired Spot Images: 1 PROCEDURE: Overlying skin prepped with Betadine, draped in the usual sterile fashion, and infiltrated locally with buffered Lidocaine. 20 gauge spinal needle advanced to the prosthetic junction of the femoral head/neck. 1 ml of Lidocaine injected easily. No fluid/frank infection could be aspirated. 4 mL  Lidocaine and 6 mL sterile saline administered into the joint with attempted subsequent aspiration. Less than 1 mL fluid was aspirated and sent for laboratory evaluation. No immediate complication. IMPRESSION: Technically successful left hip aspiration under fluoroscopy, as described above. Electronically Signed   By: Julian Hy M.D.   On: 10/15/2018 11:18   Dg Hips Bilat With Pelvis 2v  Result Date: 10/16/2018 CLINICAL DATA:  Fall.  Pain. EXAM: DG HIP (WITH OR WITHOUT PELVIS) 2V BILAT COMPARISON:  None. FINDINGS: The patient is status post bilateral hip replacements. Visualized hardware is in good position. No acute fractures. IMPRESSION: Bilateral hip replacements.  No acute fractures. Electronically Signed   By: Dorise Bullion III M.D   On: 10/16/2018 20:30   [place] Ekg Site Rite  Result Date: 10/15/2018 If Site Rite image not attached, placement could not be confirmed due to current cardiac rhythm.    LOS: 22 days   Oren Binet, MD  [facility]  If 7PM-7AM, please contact night-coverage  Please page via [email or web address]-Password [email or web address]-click on MD name and  type text message  10/26/2018, 1:02 PM

## 2018-10-26 NOTE — Progress Notes (Signed)
Pt is sleeping with CPAP on, respirations even and unlabored, color is appropriate.  Pt has requested to be allowed to sleep until he wakes up.  Will give meds and assess pt when he arouses.

## 2018-10-27 LAB — GLUCOSE, CAPILLARY: Glucose-Capillary: 88 mg/dL (ref 70–99)

## 2018-10-27 MED ORDER — LACTULOSE 10 GM/15ML PO SOLN
30.0000 g | Freq: Three times a day (TID) | ORAL | Status: DC
  Administered 2018-10-27 – 2018-10-29 (×3): 30 g via ORAL
  Filled 2018-10-27 (×8): qty 45

## 2018-10-27 NOTE — Progress Notes (Signed)
PROGRESS NOTE        PATIENT DETAILS Name: [name] Age: 73 y.o. Sex: male Date of Birth: March 01, 1946 Admit Date: 10/01/2018 Admitting Physician Vianne Bulls, MD HUT:[id number], Hollice Espy, MD  Brief Narrative: Patient is a 73 y.o. male with history of A. 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  Found to have a soft tissue infection of the left hip area, acute kidney injury and hyperkalemia.  ID, orthopedics consulted-underwent left hip aspiration on 2/14, infectious disease recommending 6 weeks of IV Invanz.   See below for further details  Subjective: Complains of right shoulder pain.  Had BM yesterday.  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  Was on suppressive Flagyl for Bacteroides fragilis infection-recommendations from ID are for 6 weeks of IV ertapenem-end date of November 25, 2018.  AKI: Likely hemodynamically mediated-secondary to soft tissue infection of the left hip, diuretic use.  Resolved with supportive care.  Follow electrolytes periodically.  Hyperkalemia: Suspect secondary to AKI with use of Aldactone and potassium supplementation.  Resolved.  History of left total hip arthroplasty with subsequent infection with Bacteroides fragilis: Patient was evaluated by infectious disease at [facility] was previously on  Flagyl.  Now on IV Invanz-Flagyl has been discontinued.  Recurrent pleural effusion: Felt to be secondary to hepatic hydrothorax-most recent CT chest on 10/07/2018 shows a small pleural effusion-patient is asymptomatic at this point.  Doubt further work-up required-furthermore he has had a recent extensive work-up including multiple thoracocentesis   Cryptogenic cirrhosis: Prior hepatitis serology done earlier this month was negative-agree with plans outlined in prior discharge summary for outpatient follow-up.  Volume status is stable-he is awake and alert-continue Aldactone  Atrial fibrillation: Rate controlled with Cardizem.Continue Eliquis.  Chronic back and right shoulder pain: Continue Lidoderm patch, Tylenol and as needed narcotics.  Bipolar disorder: Stable-continue Lexapro, Neurontin  Constipation: Required enema x1 yesterday-continue MiraLAX and senna-change lactulose to 3 times daily  Right shoulder pain: Claims he has severe arthritis-and has required cortisone shots in the past-spoke with Orion Crook, PA-C with orthopedics, orthopedic service does not do inpatient cortisone shots.  Will manage with supportive care.  Shoulder exam is benign-with no signs of infection.  OSA: Continue CPAP nightly  Morbid obesity  Acute on chronic debility/deconditioning: Has chronic debility/deconditioning at baseline-this is worsened due to acute illness/AKI/left hip infection.  Per social work-very difficult to place to SNF.  DVT Prophylaxis: Full dose anticoagulation with Eliquis  Code Status: Full code  Family Communication: None at bedside  Disposition Plan: Remain inpatient- SNF on discharge when bed available  Antimicrobial agents: Anti-infectives (From admission, onward)   Start     Dose/Rate Route Frequency Ordered Stop   10/15/18 1500  cefTRIAXone (ROCEPHIN) 2 g in sodium chloride 0.9 % 100 mL IVPB  Status:  Discontinued     2 g 200 mL/hr  over 30 Minutes Intravenous  Every 24 hours 10/15/18 1434 10/15/18 1447   10/15/18 1500  ertapenem (INVANZ) 1,000 mg in sodium chloride 0.9 % 100 mL IVPB     1 g 200 mL/hr over 30 Minutes Intravenous Every 24 hours 10/15/18 1447 11/25/18 2359   10/08/18 0000  doxycycline (VIBRA-TABS) 100 MG tablet     100 mg Oral Every 12 hours 10/08/18 0921     10/06/18 1000  doxycycline (VIBRA-TABS) tablet 100 mg  Status:  Discontinued     100 mg Oral Every 12 hours 10/06/18 0726 10/11/18 1328   10/03/18 1800  metroNIDAZOLE (FLAGYL) tablet 500 mg  Status:  Discontinued     500 mg Oral Every 8 hours 10/03/18 1054 10/15/18 1447   10/03/18 0700  vancomycin (VANCOCIN) 2,000 mg in sodium chloride 0.9 % 500 mL IVPB  Status:  Discontinued     2,000 mg 250 mL/hr over 120 Minutes Intravenous Every 36 hours 10/01/18 1942 10/06/18 0726   10/02/18 0200  ceFEPIme (MAXIPIME) 2 g in sodium chloride 0.9 % 100 mL IVPB  Status:  Discontinued     2 g 200 mL/hr over 30 Minutes Intravenous Every 8 hours 10/01/18 1942 10/02/18 1008   10/01/18 1830  vancomycin (VANCOCIN) 2,500 mg in sodium chloride 0.9 % 500 mL IVPB     2,500 mg 250 mL/hr over 120 Minutes Intravenous STAT 10/01/18 1825 10/02/18 0126   10/01/18 1800  ceFEPIme (MAXIPIME) 2 g in sodium chloride 0.9 % 100 mL IVPB     2 g 200 mL/hr over 30 Minutes Intravenous  Once 10/01/18 1747 10/01/18 1903   10/01/18 1800  metroNIDAZOLE (FLAGYL) IVPB 500 mg  Status:  Discontinued     500 mg 100 mL/hr over 60 Minutes Intravenous Every 8 hours 10/01/18 1747 10/03/18 1054   10/01/18 1800  vancomycin (VANCOCIN) IVPB 1000 mg/200 mL premix  Status:  Discontinued     1,000 mg 200 mL/hr over 60 Minutes Intravenous  Once 10/01/18 1747 10/01/18 1825      Procedures: None  CONSULTS:  None  Time spent: 15- minutes-Greater than 50% of this time was spent in counseling, explanation of diagnosis, planning of further management, and coordination of care.  MEDICATIONS: Scheduled Meds: . apixaban  5 mg Oral  BID  . cycloSPORINE  1 drop Both Eyes BID  . diltiazem  180 mg Oral Daily  . escitalopram  10 mg Oral Daily  . gabapentin  100 mg Oral TID  . lactulose  30 g Oral Daily  . lidocaine  1 patch Transdermal Q24H  . Melatonin  6 mg Oral QHS  . midodrine  10 mg Oral TID WC  . multivitamin with minerals  1 tablet Oral Daily  . pantoprazole  40 mg Oral Daily  . polyethylene glycol  17 g Oral BID  . ENSURE MAX PROTEIN  11 oz Oral BID  . senna  2 tablet Oral QHS  . spironolactone  50 mg Oral Daily  . thiamine  100 mg Oral Daily   Continuous Infusions: . sodium chloride 250 mL (10/24/18 0052)  . ertapenem 1,000 mg (10/26/18 1440)   PRN Meds:.sodium chloride, acetaminophen, albuterol, alum & mag hydroxide-simeth, bisacodyl, diltiazem, hydrOXYzine, menthol-cetylpyridinium, [DISCONTINUED] ondansetron **OR** ondansetron (ZOFRAN) IV, oxyCODONE, sodium chloride flush, traZODone, zolpidem   PHYSICAL EXAM: Vital signs: Vitals:   10/26/18 1435 10/26/18 2145 10/27/18 0500 10/27/18 0604  BP: 107/74 100/80  94/63  Pulse: 92 90  (!) 101  Resp: 18 18  17  Temp: 98.2 F (36.8 C) 98.7 F (37.1 C)  97.9 F (36.6 C)  TempSrc: Oral Oral  Oral  SpO2: 99%   98%  Weight:   120.5 kg   Height:       Filed Weights   10/24/18 0500 10/26/18 1000 10/27/18 0500  Weight: 121.9 kg 119.8 kg 120.5 kg   Body mass index is 40.39 kg/m.   General appearance:Awake, alert, not in any distress.  Eyes:no scleral icterus. HEENT: Atraumatic and Normocephalic Neck: supple, no JVD. Resp:Good air entry bilaterally,no rales or rhonchi CVS: S1 S2 regular, no murmurs.  GI: Bowel sounds present, Non tender and not distended with no gaurding, rigidity or rebound. Extremities: B/L Lower Ext shows no edema, both legs are warm to touch Neurology:  Non focal Musculoskeletal:No digital cyanosis Skin:No Rash, warm and dry Wounds:N/A  LABORATORY DATA: CBC: Recent Labs  Lab 10/22/18 2357  WBC 6.6  HGB 10.1*  HCT 33.2*    MCV 90.2  PLT 353    Basic Metabolic Panel: Recent Labs  Lab 10/21/18 0319 10/22/18 2357  NA 134* 135  K 5.1 4.7  CL 105 105  CO2 20* 26  GLUCOSE 84 115*  BUN 38* 33*  CREATININE 1.06 0.96  CALCIUM 9.5 9.5  MG 2.0  --     GFR: Estimated Creatinine Clearance: 87.8 mL/min (by C-G formula based on SCr of 0.96 mg/dL).  Liver Function Tests: No results for input(s): AST, ALT, ALKPHOS, BILITOT, PROT, ALBUMIN in the last 168 hours. No results for input(s): LIPASE, AMYLASE in the last 168 hours. No results for input(s): AMMONIA in the last 168 hours.  Coagulation Profile: No results for input(s): INR, PROTIME in the last 168 hours.  Cardiac Enzymes: No results for input(s): CKTOTAL, CKMB, CKMBINDEX, TROPONINI in the last 168 hours.  BNP (last 3 results) No results for input(s): PROBNP in the last 8760 hours.  HbA1C: No results for input(s): HGBA1C in the last 72 hours.  CBG: Recent Labs  Lab 10/21/18 0816 10/22/18 0801 10/25/18 0740 10/26/18 0809 10/27/18 0807  GLUCAP 94 89 94 97 88    Lipid Profile: No results for input(s): CHOL, HDL, LDLCALC, TRIG, CHOLHDL, LDLDIRECT in the last 72 hours.  Thyroid Function Tests: No results for input(s): TSH, T4TOTAL, FREET4, T3FREE, THYROIDAB in the last 72 hours.  Anemia Panel: No results for input(s): VITAMINB12, FOLATE, FERRITIN, TIBC, IRON, RETICCTPCT in the last 72 hours.  Urine analysis:    Component Value Date/Time   COLORURINE YELLOW 10/01/2018 1822   APPEARANCEUR CLEAR 10/01/2018 1822   APPEARANCEUR Clear 01/18/2018 1539   LABSPEC 1.010 10/01/2018 1822   PHURINE 5.0 10/01/2018 1822   GLUCOSEU NEGATIVE 10/01/2018 1822   HGBUR NEGATIVE 10/01/2018 1822   BILIRUBINUR NEGATIVE 10/01/2018 1822   BILIRUBINUR Negative 01/18/2018 1539   KETONESUR NEGATIVE 10/01/2018 1822   PROTEINUR NEGATIVE 10/01/2018 1822   UROBILINOGEN 0.2 09/06/2012 0213   NITRITE NEGATIVE 10/01/2018 1822   LEUKOCYTESUR NEGATIVE 10/01/2018  1822   LEUKOCYTESUR Negative 01/18/2018 1539    Sepsis Labs: Lactic Acid, Venous    Component Value Date/Time   LATICACIDVEN 1.7 10/01/2018 1833    MICROBIOLOGY: No results found for this or any previous visit (from the past 240 hour(s)).  RADIOLOGY STUDIES/RESULTS: Dg Chest 1 View  Result Date: 10/01/2018 CLINICAL DATA:  Follow-up pneumothorax. EXAM: CHEST  1 VIEW COMPARISON:  09/09/2018 FINDINGS: Mild bilateral interstitial thickening. Small bilateral pleural effusions. Left lateral pleural thickening with a subtle lucency which may reflect a small  loculated pneumothorax versus artifact. Right infrahilar hazy airspace disease likely reflecting atelectasis. Stable cardiomediastinal silhouette. No aggressive osseous lesion. Mild osteoarthritis of bilateral glenohumeral joints. IMPRESSION: Left lateral pleural thickening with a subtle lucency which may reflect a small loculated pneumothorax versus artifact. Recommend follow-up chest x-ray for re-evaluation. Bilateral interstitial thickening and small bilateral pleural effusions concerning for mild pulmonary edema. Electronically Signed   By: Kathreen Devoid   On: 10/01/2018 15:38   Dg Knee 1-2 Views Left  Result Date: 10/16/2018 CLINICAL DATA:  Pain after fall EXAM: LEFT KNEE - 1-2 VIEW COMPARISON:  None. FINDINGS: No acute fracture or dislocation. No joint effusion. Moderate to severe degenerative changes. IMPRESSION: Degenerative changes.  No acute fracture noted. Electronically Signed   By: Dorise Bullion III M.D   On: 10/16/2018 20:32   Dg Knee 1-2 Views Right  Result Date: 10/16/2018 CLINICAL DATA:  Pain after fall EXAM: RIGHT KNEE - 1-2 VIEW COMPARISON:  None. FINDINGS: No acute fracture or effusion. Moderate to severe degenerative changes. IMPRESSION: No acute abnormality. Electronically Signed   By: Dorise Bullion III M.D   On: 10/16/2018 20:32   Ct Chest Wo Contrast  Result Date: 10/07/2018 CLINICAL DATA:  Chronic dyspnea. EXAM: CT  CHEST WITHOUT CONTRAST TECHNIQUE: Multidetector CT imaging of the chest was performed following the standard protocol without IV contrast. COMPARISON:  Portable chest obtained earlier today and chest CTA dated 08/17/2018. FINDINGS: Cardiovascular: Atheromatous calcifications, including the coronary arteries and aorta. Stable borderline enlargement of the heart with mild biatrial enlargement. No pericardial effusion. Mediastinum/Nodes: No enlarged mediastinal or axillary lymph nodes. Thyroid gland, trachea, and esophagus demonstrate no significant findings. Lungs/Pleura: Small to moderate-sized right pleural effusion. Stable left pleural thickening with calcifications. Mild bilateral atelectasis and scarring. Upper Abdomen: Post gastric bypass changes. Musculoskeletal: Mild dextroconvex thoracolumbar scoliosis. Extensive thoracic and lower cervical spine degenerative changes. IMPRESSION: 1. Small to moderate-sized right pleural effusion. 2. Mild bilateral atelectasis and scarring. 3. 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 4.  Calcific coronary artery and aortic atherosclerosis. Aortic Atherosclerosis (ICD10-I70.0). Electronically Signed   By: Claudie Revering M.D.   On: 10/07/2018 20:31   Ct Hip Left W Contrast  Result Date: 10/01/2018 CLINICAL DATA:  Left hip pain EXAM: CT OF THE LOWER LEFT EXTREMITY WITH CONTRAST TECHNIQUE: Multidetector CT imaging of the lower left extremity was performed according to the standard protocol following intravenous contrast administration. COMPARISON:  09/03/2018 CONTRAST:  147mL OMNIPAQUE IOHEXOL 300 MG/ML  SOLN FINDINGS: Bones/Joint/Cartilage An uncemented left total hip arthroplasty is identified with the tip traversing a left femoral diaphyseal fracture that is incompletely healed. Subtle residual fracture lucencies are suggested on the coronal reformats, series 6/46 through 48 for instance. There is callus formation  developing about the fracture, series 3/143. Slight residual varus configuration about the fracture is seen of the femur. No new/acute appearing fracture or bone destruction is seen. With reference to the hip joint, no significant joint effusion or abnormal lucencies to suggest loosening. The prosthetic femoral head is seated within the acetabular component without evidence of asymmetric lining wear. No soft tissue mass or mineralization. No hematoma or abnormal fluid collections. Osteoarthritis of the included SI joints and pubic symphysis. No diastasis. Ligaments Suboptimally assessed by CT. Muscles and Tendons Negative for muscle edema, hematoma or definite tear. Soft tissues No soft tissue mass, mineralization or fluid collections. Postop scarring along the lateral aspect of the hip. IMPRESSION: 1. An uncemented left total hip arthroplasty is identified  with the tip traversing a left femoral diaphyseal fracture that is incompletely healed. Subtle residual fracture lucencies are suggested on the coronal reformats, series 6/46 through 48. Slight residual varus configuration of the femur about the fracture is noted. 2. No new/acute appearing fracture or bone destruction is seen. 3. No significant joint effusion to suggest septic arthritis though this is of clinical concern, percutaneous sampling of the joint fluid could be attempted. No definite evidence of hardware loosening however. Electronically Signed   By: Ashley Royalty M.D.   On: 10/01/2018 20:08   Dg Chest Port 1 View  Result Date: 10/16/2018 CLINICAL DATA:  PICC line placement. EXAM: PORTABLE CHEST 1 VIEW COMPARISON:  Radiographs and CT 10/07/2018. FINDINGS: 1223 hours. Left arm PICC tip is not optimally visualized, although appears to extend to the lower SVC level. The heart size and mediastinal contours are stable. There is aortic atherosclerosis. Bilateral pleural thickening and subpleural rounded atelectasis peripherally in the left lung are  unchanged. There is no pneumothorax. Glenohumeral degenerative changes are present bilaterally. IMPRESSION: PICC line tip at the lower SVC level. No pneumothorax or acute findings. Electronically Signed   By: Richardean Sale M.D.   On: 10/16/2018 13:03   Dg Chest Port 1 View  Result Date: 10/07/2018 CLINICAL DATA:  Shortness of breath. EXAM: PORTABLE CHEST 1 VIEW COMPARISON:  10/01/2017. FINDINGS: Left lower chest incompletely imaged. Cardiomegaly with diffuse bilateral interstitial prominence and bilateral pleural effusions. Findings have progressed from prior exam are most consistent with CHF. A component of pleural scarring may be present. IMPRESSION: Congestive heart failure with bilateral interstitial edema. Bibasilar pneumonia can not be excluded. Bilateral pleural effusions. A component of pleural scarring may be present. Electronically Signed   By: Marcello Moores  Register   On: 10/07/2018 08:58   Dg Chest Port 1 View  Result Date: 10/01/2018 CLINICAL DATA:  LEFT hip replacement 6 months ago, sepsis, question LEFT leg/hip infection EXAM: PORTABLE CHEST 1 VIEW COMPARISON:  Portable exam 1756 hours compared to 10/01/2018 FINDINGS: RIGHT costophrenic angle excluded. Enlargement of cardiac silhouette. Mediastinal contours and pulmonary vascularity normal. Atherosclerotic calcification aorta. Small LEFT pleural effusion and basilar atelectasis at lower LEFT chest question partially loculated. No definite infiltrate or pneumothorax. Bones demineralized. IMPRESSION: LEFT pleural effusion and basilar atelectasis, question partially locule at the lower lateral LEFT chest. Electronically Signed   By: Lavonia Dana M.D.   On: 10/01/2018 18:05   Dg Foot 2 Views Left  Result Date: 10/17/2018 CLINICAL DATA:  Pain in left foot. EXAM: LEFT FOOT - 2 VIEW COMPARISON:  None. FINDINGS: Osteopenia. No fractures in the toes or metacarpals. Enthesopathic change at the posterior calcaneus. There is a lucency along the inferior  aspect of the calcaneus which does not appear to extend through the calcaneus and is relatively well corticated. No other bony abnormalities. IMPRESSION: 1. There is a lucency along the inferior aspect of the calcaneus which is relatively well corticated and does not appear to extend through the calcaneus. This is favored to be nonacute. Recommend clinical correlation to exclude pain in this region. Electronically Signed   By: Dorise Bullion III M.D   On: 10/17/2018 15:05   Dg Fluoro Guided Needle Plc Aspiration/injection Loc  Result Date: 10/15/2018 CLINICAL DATA:  Left hip pain, evaluate for fluid collection EXAM: LEFT HIP ASPIRATION UNDER FLUOROSCOPY FLUOROSCOPY TIME:  Fluoroscopy Time:  18 seconds Radiation Exposure Index (if provided by the fluoroscopic device): 2.3 mGy Number of Acquired Spot Images: 1 PROCEDURE: Overlying skin prepped  with Betadine, draped in the usual sterile fashion, and infiltrated locally with buffered Lidocaine. 20 gauge spinal needle advanced to the prosthetic junction of the femoral head/neck. 1 ml of Lidocaine injected easily. No fluid/frank infection could be aspirated. 4 mL Lidocaine and 6 mL sterile saline administered into the joint with attempted subsequent aspiration. Less than 1 mL fluid was aspirated and sent for laboratory evaluation. No immediate complication. IMPRESSION: Technically successful left hip aspiration under fluoroscopy, as described above. Electronically Signed   By: Julian Hy M.D.   On: 10/15/2018 11:18   Dg Hips Bilat With Pelvis 2v  Result Date: 10/16/2018 CLINICAL DATA:  Fall.  Pain. EXAM: DG HIP (WITH OR WITHOUT PELVIS) 2V BILAT COMPARISON:  None. FINDINGS: The patient is status post bilateral hip replacements. Visualized hardware is in good position. No acute fractures. IMPRESSION: Bilateral hip replacements.  No acute fractures. Electronically Signed   By: Dorise Bullion III M.D   On: 10/16/2018 20:30   [place] Ekg Site Rite  Result Date:  10/15/2018 If Site Rite image not attached, placement could not be confirmed due to current cardiac rhythm.    LOS: 23 days   Oren Binet, MD  [facility]  If 7PM-7AM, please contact night-coverage  Please page via [email or web address]-Password [email or web address]-click on MD name and type text message  10/27/2018, 1:39 PM

## 2018-10-27 NOTE — Progress Notes (Signed)
Pt has refused to get out of bed twice today.  He states he will allow [place] to put him in the lift and get him out of bed 1 time today, at 6pm, when his barber is going to come to cut his hair.   Pt complains of right shoulder pain, same complaint as yesterday.  Dr. Sloan Leiter is aware.   Pt is unable to roll to either side and lift either heel off the bed.  Pt states he does not feel the need to have a bowel movement and refuses to try to push bout.

## 2018-10-27 NOTE — Progress Notes (Signed)
Pt refuses CPAP 

## 2018-10-27 NOTE — Progress Notes (Signed)
Orthopedic Tech Progress Note Patient Details:  FATEH KINDLE Jun 08, 1946 [id number]  Post OP shoe is to be put on the patient when he is up and about. Did not rec any orders for the patient to wear in the bed.  Patient ID: TOR TSUDA, male   DOB: 1946-06-01, 73 y.o.   MRN: [id number]   Carolynn Comment 10/27/2018, 11:44 AM

## 2018-10-28 LAB — GLUCOSE, CAPILLARY: Glucose-Capillary: 96 mg/dL (ref 70–99)

## 2018-10-28 MED ORDER — SENNA 8.6 MG PO TABS: 2.0000 | ORAL_TABLET | Freq: Every evening | ORAL | Status: DC | PRN

## 2018-10-28 MED ORDER — POLYETHYLENE GLYCOL 3350 17 G PO PACK: 17.0000 g | PACK | Freq: Every day | ORAL | Status: DC | PRN

## 2018-10-28 NOTE — Progress Notes (Signed)
Physical Therapy Treatment Patient Details Name: [name] MRN: [id number] DOB: 1946-05-29 Today's Date: 10/28/2018    History of Present Illness Pt is a 73 year old male with PMHx significant for HTN, afib on Eliquis, OSA, morbid obesity, L THA and hip infection and history of multiple recent prolonged hospitalizations.  Pt presented to ED with generalized weakness, falls, and shortness of breath- work-up in the emergency room showed A. fib heart rate 108 and a recurrent right-sided pleural effusion    PT Comments    Anxiety limited.  Emphasis on standing and transfers with/without use of an AD.   Follow Up Recommendations  SNF     Equipment Recommendations  None recommended by PT    Recommendations for Other Services       Precautions / Restrictions Precautions Precautions: Fall    Mobility  Bed Mobility Overal bed mobility: Needs Assistance Bed Mobility: Supine to Sit     Supine to sit: Mod assist;+2 for physical assistance;Max assist     General bed mobility comments: cues for initiation, direction.  Truncal assist to come forward slow enough to givd pt time to assist with UE's  Transfers Overall transfer level: Needs assistance Equipment used: Rolling walker (2 wheeled);None Transfers: Sit to/from [name] Sit to Stand: Mod assist;Max assist;+2 physical assistance   Squat pivot transfers: Mod assist;+2 physical assistance;+2 safety/equipment;Max assist     General transfer comment: cues for hand placement.  cues for hand placement.  Assist to help pt come forward.  Stability assist and support given in attempt to get pt upright.  Pt eventually panics and wants to sit down.  Similar scenarios x 4 standing trials and 2 squat pivot transfers.  Ambulation/Gait                 Stairs             Wheelchair Mobility    Modified Rankin (Stroke Patients Only)       Balance   Sitting-balance support: Feet  supported;Bilateral upper extremity supported;Single extremity supported Sitting balance-[name]: Fair     Standing balance support: Bilateral upper extremity supported;During functional activity Standing balance-[name]: Poor Standing balance comment: heavy reliance on the RW or assist                            Cognition Arousal/Alertness: Awake/alert Behavior During Therapy: Anxious Overall Cognitive Status: Within Functional Limits for tasks assessed                                        Exercises Other Exercises Other Exercises: bil LE warm up for hips/knees x7 reps.    General Comments        Pertinent Vitals/Pain Pain Assessment: Faces Pain Score: 8  Pain Location: vague Pain Descriptors / Indicators: Grimacing;Guarding Pain Intervention(s): Monitored during session;Limited activity within patient's tolerance    Home Living                      Prior Function            PT Goals (current goals can now be found in the care plan section) Acute Rehab PT Goals Patient Stated Goal: to get stronger and get home PT Goal Formulation: With patient(minimal progress over a weeks time) Time For Goal Achievement: 10/30/18 Potential to Achieve  Goals: Fair Progress towards PT goals: Progressing toward goals    Frequency    Min 2X/week      PT Plan Current plan remains appropriate    Co-evaluation              AM-PAC PT "6 Clicks" Mobility   Outcome Measure  Help needed turning from your back to your side while in a flat bed without using bedrails?: A Lot Help needed moving from lying on your back to sitting on the side of a flat bed without using bedrails?: A Lot Help needed moving to and from a bed to a chair (including a wheelchair)?: A Lot Help needed standing up from a chair using your arms (e.g., wheelchair or bedside chair)?: A Lot Help needed to walk in hospital room?: Total Help needed climbing 3-5  steps with a railing? : Total 6 Click Score: 10    End of Session   Activity Tolerance: Patient tolerated treatment well;Patient limited by fatigue;Patient limited by pain(and anxiety) Patient left: in chair;with call bell/phone within reach;with family/visitor present Nurse Communication: Mobility status PT Visit Diagnosis: Other abnormalities of gait and mobility (R26.89);Muscle weakness (generalized) (M62.81);Difficulty in walking, not elsewhere classified (R26.2);History of falling (Z91.81)     Time: 5176-1607 PT Time Calculation (min) (ACUTE ONLY): 70 min  Charges:  $Therapeutic Exercise: 8-22 mins $Therapeutic Activity: 38-52 mins $Self Care/Home Management: 8-22                     10/28/2018  [name], PT Acute Rehabilitation Services [phone]  (pager) [phone]  (office)   [name] [place] 10/28/2018, 11:41 AM

## 2018-10-28 NOTE — Progress Notes (Signed)
Pt has home cpap unit in the room.  Added sterile water to his cpap.  No additional help needed.

## 2018-10-28 NOTE — Progress Notes (Signed)
PROGRESS NOTE        PATIENT DETAILS Name: [name] Age: 73 y.o. Sex: male Date of Birth: July 30, 1946 Admit Date: 10/01/2018 Admitting Physician Vianne Bulls, MD EHU:[id number], Hollice Espy, MD  Brief Narrative: Patient is a 73 y.o. male with history of A. 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  Found to have a soft tissue infection of the left hip area, acute kidney injury and hyperkalemia.  ID, orthopedics consulted-underwent left hip aspiration on 2/14, infectious disease recommending 6 weeks of IV Invanz.   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  Was on suppressive Flagyl for Bacteroides fragilis infection-recommendations from ID are for 6 weeks of IV ertapenem-end date of November 25, 2018.  AKI: Likely hemodynamically mediated-secondary to soft tissue infection of the left hip, diuretic use.  Resolved with supportive care.  Follow electrolytes periodically.  Hyperkalemia: Suspect secondary to AKI with use of Aldactone and potassium supplementation.  Resolved.  History of left total hip arthroplasty with subsequent infection with Bacteroides fragilis: Patient was evaluated by infectious disease at [facility] was previously on Flagyl.  Now on IV  Invanz-Flagyl has been discontinued.  Recurrent pleural effusion: Felt to be secondary to hepatic hydrothorax-most recent CT chest on 10/07/2018 shows a small pleural effusion-patient is asymptomatic at this point.  Doubt further work-up required-furthermore he has had a recent extensive work-up including multiple thoracocentesis   Cryptogenic cirrhosis: Prior hepatitis serology done earlier this month was negative-agree with plans outlined in prior discharge summary for outpatient follow-up.  Volume status is stable-he is awake and alert-continue Aldactone  Atrial fibrillation: Rate controlled with Cardizem.Continue Eliquis.  Chronic back and right shoulder pain: Continue Lidoderm patch, Tylenol and as needed narcotics.  Bipolar disorder: Stable-continue Lexapro, Neurontin  Constipation: Has required numerous enemas over the past few days-since starting lactulose-has had multiple loose stools-change MiraLAX and senna to as needed dosing.  Continue lactulose 3 times daily.  Right shoulder pain: Claims he has severe arthritis-and has required cortisone shots in the past-spoke with Orion Crook, PA-C with orthopedics on 2/26, orthopedic service does not do inpatient cortisone shots.  Will manage with supportive care.  Shoulder exam is benign-with no signs of infection.  OSA: Continue CPAP nightly  Morbid obesity  Acute on chronic debility/deconditioning: Has chronic debility/deconditioning at baseline-this is worsened due to acute illness/AKI/left hip infection.  Per social work-very difficult to place to SNF.  DVT Prophylaxis: Full dose anticoagulation with Eliquis  Code Status: Full code  Family Communication: None at bedside  Disposition Plan: Remain inpatient- SNF on discharge when bed available  Antimicrobial agents: Anti-infectives (From admission, onward)   Start     Dose/Rate Route Frequency Ordered Stop   10/15/18 1500  cefTRIAXone (ROCEPHIN) 2 g in sodium chloride 0.9 %  100 mL  IVPB  Status:  Discontinued     2 g 200 mL/hr over 30 Minutes Intravenous Every 24 hours 10/15/18 1434 10/15/18 1447   10/15/18 1500  ertapenem (INVANZ) 1,000 mg in sodium chloride 0.9 % 100 mL IVPB     1 g 200 mL/hr over 30 Minutes Intravenous Every 24 hours 10/15/18 1447 11/25/18 2359   10/08/18 0000  doxycycline (VIBRA-TABS) 100 MG tablet     100 mg Oral Every 12 hours 10/08/18 0921     10/06/18 1000  doxycycline (VIBRA-TABS) tablet 100 mg  Status:  Discontinued     100 mg Oral Every 12 hours 10/06/18 0726 10/11/18 1328   10/03/18 1800  metroNIDAZOLE (FLAGYL) tablet 500 mg  Status:  Discontinued     500 mg Oral Every 8 hours 10/03/18 1054 10/15/18 1447   10/03/18 0700  vancomycin (VANCOCIN) 2,000 mg in sodium chloride 0.9 % 500 mL IVPB  Status:  Discontinued     2,000 mg 250 mL/hr over 120 Minutes Intravenous Every 36 hours 10/01/18 1942 10/06/18 0726   10/02/18 0200  ceFEPIme (MAXIPIME) 2 g in sodium chloride 0.9 % 100 mL IVPB  Status:  Discontinued     2 g 200 mL/hr over 30 Minutes Intravenous Every 8 hours 10/01/18 1942 10/02/18 1008   10/01/18 1830  vancomycin (VANCOCIN) 2,500 mg in sodium chloride 0.9 % 500 mL IVPB     2,500 mg 250 mL/hr over 120 Minutes Intravenous STAT 10/01/18 1825 10/02/18 0126   10/01/18 1800  ceFEPIme (MAXIPIME) 2 g in sodium chloride 0.9 % 100 mL IVPB     2 g 200 mL/hr over 30 Minutes Intravenous  Once 10/01/18 1747 10/01/18 1903   10/01/18 1800  metroNIDAZOLE (FLAGYL) IVPB 500 mg  Status:  Discontinued     500 mg 100 mL/hr over 60 Minutes Intravenous Every 8 hours 10/01/18 1747 10/03/18 1054   10/01/18 1800  vancomycin (VANCOCIN) IVPB 1000 mg/200 mL premix  Status:  Discontinued     1,000 mg 200 mL/hr over 60 Minutes Intravenous  Once 10/01/18 1747 10/01/18 1825      Procedures: None  CONSULTS:  None  Time spent: 15- minutes-Greater than 50% of this time was spent in counseling, explanation of diagnosis, planning of further management,  and coordination of care.  MEDICATIONS: Scheduled Meds: . apixaban  5 mg Oral BID  . cycloSPORINE  1 drop Both Eyes BID  . diltiazem  180 mg Oral Daily  . escitalopram  10 mg Oral Daily  . gabapentin  100 mg Oral TID  . lactulose  30 g Oral TID  . lidocaine  1 patch Transdermal Q24H  . Melatonin  6 mg Oral QHS  . midodrine  10 mg Oral TID WC  . multivitamin with minerals  1 tablet Oral Daily  . pantoprazole  40 mg Oral Daily  . ENSURE MAX PROTEIN  11 oz Oral BID  . spironolactone  50 mg Oral Daily  . thiamine  100 mg Oral Daily   Continuous Infusions: . sodium chloride 250 mL (10/24/18 0052)  . ertapenem 1,000 mg (10/27/18 1436)   PRN Meds:.sodium chloride, acetaminophen, albuterol, alum & mag hydroxide-simeth, bisacodyl, diltiazem, hydrOXYzine, menthol-cetylpyridinium, [DISCONTINUED] ondansetron **OR** ondansetron (ZOFRAN) IV, oxyCODONE, polyethylene glycol, senna, sodium chloride flush, traZODone, zolpidem   PHYSICAL EXAM: Vital signs: Vitals:   10/27/18 2026 10/28/18 0459 10/28/18 0500 10/28/18 0916  BP: 120/77 108/74  108/62  Pulse: 78 89  (!) 108  Resp: 18 18  18   Temp:  98.2 F (36.8 C) 97.6 F (36.4 C)  (!) 97.5 F (36.4 C)  TempSrc: Oral Oral  Oral  SpO2: 97% 97%  100%  Weight:   121 kg   Height:       Filed Weights   10/26/18 1000 10/27/18 0500 10/28/18 0500  Weight: 119.8 kg 120.5 kg 121 kg   Body mass index is 40.56 kg/m.   General appearance:Awake, alert, not in any distress.  Eyes:no scleral icterus. HEENT: Atraumatic and Normocephalic Neck: supple, no JVD. Resp:Good air entry bilaterally,no rales or rhonchi CVS: S1 S2 regular, no murmurs.  GI: Bowel sounds present, Non tender and not distended with no gaurding, rigidity or rebound. Extremities: B/L Lower Ext shows no edema, both legs are warm to touch Neurology:  Non focal Musculoskeletal:No digital cyanosis Skin:No Rash, warm and dry Wounds:N/A  LABORATORY DATA: CBC: Recent Labs  Lab  10/22/18 2357  WBC 6.6  HGB 10.1*  HCT 33.2*  MCV 90.2  PLT 485    Basic Metabolic Panel: Recent Labs  Lab 10/22/18 2357  NA 135  K 4.7  CL 105  CO2 26  GLUCOSE 115*  BUN 33*  CREATININE 0.96  CALCIUM 9.5    GFR: Estimated Creatinine Clearance: 88 mL/min (by C-G formula based on SCr of 0.96 mg/dL).  Liver Function Tests: No results for input(s): AST, ALT, ALKPHOS, BILITOT, PROT, ALBUMIN in the last 168 hours. No results for input(s): LIPASE, AMYLASE in the last 168 hours. No results for input(s): AMMONIA in the last 168 hours.  Coagulation Profile: No results for input(s): INR, PROTIME in the last 168 hours.  Cardiac Enzymes: No results for input(s): CKTOTAL, CKMB, CKMBINDEX, TROPONINI in the last 168 hours.  BNP (last 3 results) No results for input(s): PROBNP in the last 8760 hours.  HbA1C: No results for input(s): HGBA1C in the last 72 hours.  CBG: Recent Labs  Lab 10/22/18 0801 10/25/18 0740 10/26/18 0809 10/27/18 0807 10/28/18 0806  GLUCAP 89 94 97 88 96    Lipid Profile: No results for input(s): CHOL, HDL, LDLCALC, TRIG, CHOLHDL, LDLDIRECT in the last 72 hours.  Thyroid Function Tests: No results for input(s): TSH, T4TOTAL, FREET4, T3FREE, THYROIDAB in the last 72 hours.  Anemia Panel: No results for input(s): VITAMINB12, FOLATE, FERRITIN, TIBC, IRON, RETICCTPCT in the last 72 hours.  Urine analysis:    Component Value Date/Time   COLORURINE YELLOW 10/01/2018 1822   APPEARANCEUR CLEAR 10/01/2018 1822   APPEARANCEUR Clear 01/18/2018 1539   LABSPEC 1.010 10/01/2018 1822   PHURINE 5.0 10/01/2018 1822   GLUCOSEU NEGATIVE 10/01/2018 1822   HGBUR NEGATIVE 10/01/2018 1822   BILIRUBINUR NEGATIVE 10/01/2018 1822   BILIRUBINUR Negative 01/18/2018 1539   KETONESUR NEGATIVE 10/01/2018 1822   PROTEINUR NEGATIVE 10/01/2018 1822   UROBILINOGEN 0.2 09/06/2012 0213   NITRITE NEGATIVE 10/01/2018 1822   LEUKOCYTESUR NEGATIVE 10/01/2018 1822    LEUKOCYTESUR Negative 01/18/2018 1539    Sepsis Labs: Lactic Acid, Venous    Component Value Date/Time   LATICACIDVEN 1.7 10/01/2018 1833    MICROBIOLOGY: No results found for this or any previous visit (from the past 240 hour(s)).  RADIOLOGY STUDIES/RESULTS: Dg Chest 1 View  Result Date: 10/01/2018 CLINICAL DATA:  Follow-up pneumothorax. EXAM: CHEST  1 VIEW COMPARISON:  09/09/2018 FINDINGS: Mild bilateral interstitial thickening. Small bilateral pleural effusions. Left lateral pleural thickening with a subtle lucency which may reflect a small loculated pneumothorax versus artifact. Right infrahilar hazy airspace disease likely reflecting atelectasis. Stable cardiomediastinal silhouette. No aggressive  osseous lesion. Mild osteoarthritis of bilateral glenohumeral joints. IMPRESSION: Left lateral pleural thickening with a subtle lucency which may reflect a small loculated pneumothorax versus artifact. Recommend follow-up chest x-ray for re-evaluation. Bilateral interstitial thickening and small bilateral pleural effusions concerning for mild pulmonary edema. Electronically Signed   By: Kathreen Devoid   On: 10/01/2018 15:38   Dg Knee 1-2 Views Left  Result Date: 10/16/2018 CLINICAL DATA:  Pain after fall EXAM: LEFT KNEE - 1-2 VIEW COMPARISON:  None. FINDINGS: No acute fracture or dislocation. No joint effusion. Moderate to severe degenerative changes. IMPRESSION: Degenerative changes.  No acute fracture noted. Electronically Signed   By: Dorise Bullion III M.D   On: 10/16/2018 20:32   Dg Knee 1-2 Views Right  Result Date: 10/16/2018 CLINICAL DATA:  Pain after fall EXAM: RIGHT KNEE - 1-2 VIEW COMPARISON:  None. FINDINGS: No acute fracture or effusion. Moderate to severe degenerative changes. IMPRESSION: No acute abnormality. Electronically Signed   By: Dorise Bullion III M.D   On: 10/16/2018 20:32   Ct Chest Wo Contrast  Result Date: 10/07/2018 CLINICAL DATA:  Chronic dyspnea. EXAM: CT CHEST  WITHOUT CONTRAST TECHNIQUE: Multidetector CT imaging of the chest was performed following the standard protocol without IV contrast. COMPARISON:  Portable chest obtained earlier today and chest CTA dated 08/17/2018. FINDINGS: Cardiovascular: Atheromatous calcifications, including the coronary arteries and aorta. Stable borderline enlargement of the heart with mild biatrial enlargement. No pericardial effusion. Mediastinum/Nodes: No enlarged mediastinal or axillary lymph nodes. Thyroid gland, trachea, and esophagus demonstrate no significant findings. Lungs/Pleura: Small to moderate-sized right pleural effusion. Stable left pleural thickening with calcifications. Mild bilateral atelectasis and scarring. Upper Abdomen: Post gastric bypass changes. Musculoskeletal: Mild dextroconvex thoracolumbar scoliosis. Extensive thoracic and lower cervical spine degenerative changes. IMPRESSION: 1. Small to moderate-sized right pleural effusion. 2. Mild bilateral atelectasis and scarring. 3. 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 4.  Calcific coronary artery and aortic atherosclerosis. Aortic Atherosclerosis (ICD10-I70.0). Electronically Signed   By: Claudie Revering M.D.   On: 10/07/2018 20:31   Ct Hip Left W Contrast  Result Date: 10/01/2018 CLINICAL DATA:  Left hip pain EXAM: CT OF THE LOWER LEFT EXTREMITY WITH CONTRAST TECHNIQUE: Multidetector CT imaging of the lower left extremity was performed according to the standard protocol following intravenous contrast administration. COMPARISON:  09/03/2018 CONTRAST:  162mL OMNIPAQUE IOHEXOL 300 MG/ML  SOLN FINDINGS: Bones/Joint/Cartilage An uncemented left total hip arthroplasty is identified with the tip traversing a left femoral diaphyseal fracture that is incompletely healed. Subtle residual fracture lucencies are suggested on the coronal reformats, series 6/46 through 48 for instance. There is callus formation developing  about the fracture, series 3/143. Slight residual varus configuration about the fracture is seen of the femur. No new/acute appearing fracture or bone destruction is seen. With reference to the hip joint, no significant joint effusion or abnormal lucencies to suggest loosening. The prosthetic femoral head is seated within the acetabular component without evidence of asymmetric lining wear. No soft tissue mass or mineralization. No hematoma or abnormal fluid collections. Osteoarthritis of the included SI joints and pubic symphysis. No diastasis. Ligaments Suboptimally assessed by CT. Muscles and Tendons Negative for muscle edema, hematoma or definite tear. Soft tissues No soft tissue mass, mineralization or fluid collections. Postop scarring along the lateral aspect of the hip. IMPRESSION: 1. An uncemented left total hip arthroplasty is identified with the tip traversing a left femoral diaphyseal fracture that is incompletely healed. Subtle residual fracture lucencies  are suggested on the coronal reformats, series 6/46 through 48. Slight residual varus configuration of the femur about the fracture is noted. 2. No new/acute appearing fracture or bone destruction is seen. 3. No significant joint effusion to suggest septic arthritis though this is of clinical concern, percutaneous sampling of the joint fluid could be attempted. No definite evidence of hardware loosening however. Electronically Signed   By: Ashley Royalty M.D.   On: 10/01/2018 20:08   Dg Chest Port 1 View  Result Date: 10/16/2018 CLINICAL DATA:  PICC line placement. EXAM: PORTABLE CHEST 1 VIEW COMPARISON:  Radiographs and CT 10/07/2018. FINDINGS: 1223 hours. Left arm PICC tip is not optimally visualized, although appears to extend to the lower SVC level. The heart size and mediastinal contours are stable. There is aortic atherosclerosis. Bilateral pleural thickening and subpleural rounded atelectasis peripherally in the left lung are unchanged. There is  no pneumothorax. Glenohumeral degenerative changes are present bilaterally. IMPRESSION: PICC line tip at the lower SVC level. No pneumothorax or acute findings. Electronically Signed   By: Richardean Sale M.D.   On: 10/16/2018 13:03   Dg Chest Port 1 View  Result Date: 10/07/2018 CLINICAL DATA:  Shortness of breath. EXAM: PORTABLE CHEST 1 VIEW COMPARISON:  10/01/2017. FINDINGS: Left lower chest incompletely imaged. Cardiomegaly with diffuse bilateral interstitial prominence and bilateral pleural effusions. Findings have progressed from prior exam are most consistent with CHF. A component of pleural scarring may be present. IMPRESSION: Congestive heart failure with bilateral interstitial edema. Bibasilar pneumonia can not be excluded. Bilateral pleural effusions. A component of pleural scarring may be present. Electronically Signed   By: Marcello Moores  Register   On: 10/07/2018 08:58   Dg Chest Port 1 View  Result Date: 10/01/2018 CLINICAL DATA:  LEFT hip replacement 6 months ago, sepsis, question LEFT leg/hip infection EXAM: PORTABLE CHEST 1 VIEW COMPARISON:  Portable exam 1756 hours compared to 10/01/2018 FINDINGS: RIGHT costophrenic angle excluded. Enlargement of cardiac silhouette. Mediastinal contours and pulmonary vascularity normal. Atherosclerotic calcification aorta. Small LEFT pleural effusion and basilar atelectasis at lower LEFT chest question partially loculated. No definite infiltrate or pneumothorax. Bones demineralized. IMPRESSION: LEFT pleural effusion and basilar atelectasis, question partially locule at the lower lateral LEFT chest. Electronically Signed   By: Lavonia Dana M.D.   On: 10/01/2018 18:05   Dg Foot 2 Views Left  Result Date: 10/17/2018 CLINICAL DATA:  Pain in left foot. EXAM: LEFT FOOT - 2 VIEW COMPARISON:  None. FINDINGS: Osteopenia. No fractures in the toes or metacarpals. Enthesopathic change at the posterior calcaneus. There is a lucency along the inferior aspect of the calcaneus  which does not appear to extend through the calcaneus and is relatively well corticated. No other bony abnormalities. IMPRESSION: 1. There is a lucency along the inferior aspect of the calcaneus which is relatively well corticated and does not appear to extend through the calcaneus. This is favored to be nonacute. Recommend clinical correlation to exclude pain in this region. Electronically Signed   By: Dorise Bullion III M.D   On: 10/17/2018 15:05   Dg Fluoro Guided Needle Plc Aspiration/injection Loc  Result Date: 10/15/2018 CLINICAL DATA:  Left hip pain, evaluate for fluid collection EXAM: LEFT HIP ASPIRATION UNDER FLUOROSCOPY FLUOROSCOPY TIME:  Fluoroscopy Time:  18 seconds Radiation Exposure Index (if provided by the fluoroscopic device): 2.3 mGy Number of Acquired Spot Images: 1 PROCEDURE: Overlying skin prepped with Betadine, draped in the usual sterile fashion, and infiltrated locally with buffered Lidocaine. 20 gauge spinal  needle advanced to the prosthetic junction of the femoral head/neck. 1 ml of Lidocaine injected easily. No fluid/frank infection could be aspirated. 4 mL Lidocaine and 6 mL sterile saline administered into the joint with attempted subsequent aspiration. Less than 1 mL fluid was aspirated and sent for laboratory evaluation. No immediate complication. IMPRESSION: Technically successful left hip aspiration under fluoroscopy, as described above. Electronically Signed   By: Julian Hy M.D.   On: 10/15/2018 11:18   Dg Hips Bilat With Pelvis 2v  Result Date: 10/16/2018 CLINICAL DATA:  Fall.  Pain. EXAM: DG HIP (WITH OR WITHOUT PELVIS) 2V BILAT COMPARISON:  None. FINDINGS: The patient is status post bilateral hip replacements. Visualized hardware is in good position. No acute fractures. IMPRESSION: Bilateral hip replacements.  No acute fractures. Electronically Signed   By: Dorise Bullion III M.D   On: 10/16/2018 20:30   [place] Ekg Site Rite  Result Date: 10/15/2018 If Site Rite  image not attached, placement could not be confirmed due to current cardiac rhythm.    LOS: 24 days   Oren Binet, MD  [facility]  If 7PM-7AM, please contact night-coverage  Please page via [email or web address] [email or web address]-click on MD name and type text message  10/28/2018, 1:30 PM

## 2018-10-29 LAB — GLUCOSE, CAPILLARY: Glucose-Capillary: 81 mg/dL (ref 70–99)

## 2018-10-29 NOTE — Progress Notes (Signed)
Nutrition Follow-up  DOCUMENTATION CODES:   Morbid obesity  INTERVENTION:   - Continue Ensure Max po BID, each supplement provides 150 kcal and 30 grams of protein  - Continue MVI with minerals daily   NUTRITION DIAGNOSIS:   Increased nutrient needs related to chronic illness(Cirrhosis ) as evidenced by estimated needs.  Ongoing  GOAL:   Patient will meet greater than or equal to 90% of their needs  Mostly meeting with PO intake  MONITOR:   PO intake, Supplement acceptance, Labs, Weight trends, Skin, I & O's  REASON FOR ASSESSMENT:   Malnutrition Screening Tool    ASSESSMENT:   73 y.o. male with medical history significant for chronic back and right shoulder pain, OSA on CPAP, paroxysmal atrial fibrillation on Eliquis, hepatic cirrhosis, recurrent pleural effusions, and status post left total hip arthroplasty complicated by infection  No new labs since 2/21 Meds: MVI with minerals, Protonix, Ensure Max BID, thiamine  Pt alert and pleasant, resting in bed at time of visit. States his appetite is improving a bit. Reports several days of constipation, followed by diarrhea. Endorses some nausea when eating, and is having trouble finding menu options that are not too heavy. Suggested fruit tray with cottage cheese, or selecting a la carte items instead of a full meal. Encouraged pt to include protein-rich foods with all meals and snacks, and to eat those foods first if not feeling hungry. Amenable to continuing Ensure Max, but would prefer to receive it cold (and not over ice).  NUTRITION - FOCUSED PHYSICAL EXAM: 2/01   Most Recent Value  Orbital Region  No depletion  Upper Arm Region  No depletion  Thoracic and Lumbar Region  No depletion  Buccal Region  No depletion  Temple Region  No depletion  Clavicle Bone Region  No depletion  Clavicle and Acromion Bone Region  No depletion  Scapular Bone Region  No depletion  Dorsal Hand  No depletion  Patellar Region  No  depletion  Anterior Thigh Region  No depletion  Posterior Calf Region  No depletion  Edema (RD Assessment)  Mild  Hair  Reviewed  Eyes  Reviewed  Mouth  Reviewed  Skin  Reviewed  Nails  Reviewed      Diet Order:  No known food allergies Diet Order            Diet 2 gram sodium Room service appropriate? Yes; Fluid consistency: Thin  Diet effective now        Diet - low sodium heart healthy             2/19-2/28: average 84% x 8 meals recorded  EDUCATION NEEDS:   Education needs have been addressed  Skin:  Skin Assessment: Skin Integrity Issues: Skin Integrity Issues:: Incisions, Other (Comment) Incisions: L hip Other: non-pressure wound to R buttocks  Last BM:  2/27, type 7  Height:  Ht Readings from Last 1 Encounters:  10/01/18 5\' 8"  (1.727 m)    Weight:  Wt Readings from Last 10 Encounters:  10/29/18 119.4 kg  09/11/18 133.8 kg  09/02/18 (!) 142.7 kg  04/24/18 (!) 163.6 kg  03/17/18 (!) 155.6 kg  02/20/18 (!) 156 kg  01/11/18 (!) 152 kg  12/30/17 (!) 156.5 kg  12/28/17 (!) 156.5 kg  11/23/17 (!) 154.7 kg  14.4 kg wt loss x 6 weeks, noted net -10.1 L fluid since 2/14  Ideal Body Weight:  70 kg  BMI:  Body mass index is 40.02 kg/m.  Estimated Nutritional  Needs:   Kcal:  1484-0397  Protein:  125-140 grams  Fluid:  2.2-2.4 L  Althea Grimmer, MS, RDN, LDN Pager: [phone] Available Mondays and Fridays, 9am-2pm

## 2018-10-29 NOTE — Progress Notes (Signed)
Patient remains on Difficult to Place waitlist.   Cedric Fishman LCSW [phone]

## 2018-10-29 NOTE — Progress Notes (Signed)
PROGRESS NOTE        PATIENT DETAILS Name: [name] Age: 73 y.o. Sex: male Date of Birth: Mar 07, 1946 Admit Date: 10/01/2018 Admitting Physician Vianne Bulls, MD [id number], Hollice Espy, MD  Brief Narrative: Patient is a 73 y.o. male with history of A. 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  Found to have a soft tissue infection of the left hip area, acute kidney injury and hyperkalemia.  ID, orthopedics consulted-underwent left hip aspiration on 2/14, infectious disease recommending 6 weeks of IV Invanz.   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  Was on suppressive Flagyl for Bacteroides fragilis infection-recommendations from ID are for 6 weeks of IV ertapenem-end date of November 25, 2018.  AKI: Likely hemodynamically mediated-secondary to soft tissue infection of the left hip, diuretic use.  Resolved with supportive care.  Follow electrolytes periodically.  Hyperkalemia: Suspect secondary to AKI with use of Aldactone and potassium supplementation.  Resolved.  History of left total hip arthroplasty with subsequent infection with Bacteroides fragilis: Patient was evaluated by infectious disease at [facility] was previously on Flagyl.  Now on IV Invanz-Flagyl has  been discontinued.  Recurrent pleural effusion: Felt to be secondary to hepatic hydrothorax-most recent CT chest on 10/07/2018 shows a small pleural effusion-patient is asymptomatic at this point.  Doubt further work-up required-furthermore he has had a recent extensive work-up including multiple thoracocentesis   Cryptogenic cirrhosis: Prior hepatitis serology done earlier this month was negative-agree with plans outlined in prior discharge summary for outpatient follow-up.  Volume status is stable-he is awake and alert-continue Aldactone  Atrial fibrillation: Rate controlled with Cardizem.Continue Eliquis.  Chronic back and right shoulder pain: Continue Lidoderm patch, Tylenol and as needed narcotics.  Bipolar disorder: Stable-continue Lexapro, Neurontin  Constipation: Has required numerous enemas over the past few days-since starting lactulose-has had multiple loose stools-change MiraLAX and senna to as needed dosing.  Continue lactulose 3 times daily.  Right shoulder pain: Claims he has severe arthritis-and has required cortisone shots in the past-spoke with Orion Crook, PA-C with orthopedics on 2/26, orthopedic service does not do inpatient cortisone shots.  Will manage with supportive care.  Shoulder exam is benign-with no signs of infection.  OSA: Continue CPAP nightly  Morbid obesity  Acute on chronic debility/deconditioning: Has chronic debility/deconditioning at baseline-this is worsened due to acute illness/AKI/left hip infection.  Per social work-very difficult to place to SNF.  DVT Prophylaxis: Full dose anticoagulation with Eliquis  Code Status: Full code  Family Communication: None at bedside  Disposition Plan: Remain inpatient- SNF on discharge when bed available  Antimicrobial agents: Anti-infectives (From admission, onward)   Start     Dose/Rate Route Frequency Ordered Stop   10/15/18 1500  cefTRIAXone (ROCEPHIN) 2 g in sodium chloride 0.9 % 100 mL  IVPB   Status:  Discontinued     2 g 200 mL/hr over 30 Minutes Intravenous Every 24 hours 10/15/18 1434 10/15/18 1447   10/15/18 1500  ertapenem (INVANZ) 1,000 mg in sodium chloride 0.9 % 100 mL IVPB     1 g 200 mL/hr over 30 Minutes Intravenous Every 24 hours 10/15/18 1447 11/25/18 2359   10/08/18 0000  doxycycline (VIBRA-TABS) 100 MG tablet     100 mg Oral Every 12 hours 10/08/18 0921     10/06/18 1000  doxycycline (VIBRA-TABS) tablet 100 mg  Status:  Discontinued     100 mg Oral Every 12 hours 10/06/18 0726 10/11/18 1328   10/03/18 1800  metroNIDAZOLE (FLAGYL) tablet 500 mg  Status:  Discontinued     500 mg Oral Every 8 hours 10/03/18 1054 10/15/18 1447   10/03/18 0700  vancomycin (VANCOCIN) 2,000 mg in sodium chloride 0.9 % 500 mL IVPB  Status:  Discontinued     2,000 mg 250 mL/hr over 120 Minutes Intravenous Every 36 hours 10/01/18 1942 10/06/18 0726   10/02/18 0200  ceFEPIme (MAXIPIME) 2 g in sodium chloride 0.9 % 100 mL IVPB  Status:  Discontinued     2 g 200 mL/hr over 30 Minutes Intravenous Every 8 hours 10/01/18 1942 10/02/18 1008   10/01/18 1830  vancomycin (VANCOCIN) 2,500 mg in sodium chloride 0.9 % 500 mL IVPB     2,500 mg 250 mL/hr over 120 Minutes Intravenous STAT 10/01/18 1825 10/02/18 0126   10/01/18 1800  ceFEPIme (MAXIPIME) 2 g in sodium chloride 0.9 % 100 mL IVPB     2 g 200 mL/hr over 30 Minutes Intravenous  Once 10/01/18 1747 10/01/18 1903   10/01/18 1800  metroNIDAZOLE (FLAGYL) IVPB 500 mg  Status:  Discontinued     500 mg 100 mL/hr over 60 Minutes Intravenous Every 8 hours 10/01/18 1747 10/03/18 1054   10/01/18 1800  vancomycin (VANCOCIN) IVPB 1000 mg/200 mL premix  Status:  Discontinued     1,000 mg 200 mL/hr over 60 Minutes Intravenous  Once 10/01/18 1747 10/01/18 1825      Procedures: None  CONSULTS:  None  Time spent: 15- minutes-Greater than 50% of this time was spent in counseling, explanation of diagnosis, planning of further management, and  coordination of care.  MEDICATIONS: Scheduled Meds: . apixaban  5 mg Oral BID  . cycloSPORINE  1 drop Both Eyes BID  . diltiazem  180 mg Oral Daily  . escitalopram  10 mg Oral Daily  . gabapentin  100 mg Oral TID  . lactulose  30 g Oral TID  . lidocaine  1 patch Transdermal Q24H  . Melatonin  6 mg Oral QHS  . midodrine  10 mg Oral TID WC  . multivitamin with minerals  1 tablet Oral Daily  . pantoprazole  40 mg Oral Daily  . ENSURE MAX PROTEIN  11 oz Oral BID  . spironolactone  50 mg Oral Daily  . thiamine  100 mg Oral Daily   Continuous Infusions: . sodium chloride 250 mL (10/24/18 0052)  . ertapenem 1,000 mg (10/28/18 1525)   PRN Meds:.sodium chloride, acetaminophen, albuterol, alum & mag hydroxide-simeth, bisacodyl, diltiazem, hydrOXYzine, menthol-cetylpyridinium, [DISCONTINUED] ondansetron **OR** ondansetron (ZOFRAN) IV, oxyCODONE, polyethylene glycol, senna, sodium chloride flush, traZODone, zolpidem   PHYSICAL EXAM: Vital signs: Vitals:   10/28/18 0916 10/28/18 1404 10/28/18 2122 10/29/18 0435  BP: 108/62 96/71 115/79   Pulse: (!) 108 87 91   Resp: 18 18 16  Temp: (!) 97.5 F (36.4 C) 97.9 F (36.6 C) 98.8 F (37.1 C)   TempSrc: Oral  Oral   SpO2: 100% 98% 94%   Weight:    119.4 kg  Height:       Filed Weights   10/27/18 0500 10/28/18 0500 10/29/18 0435  Weight: 120.5 kg 121 kg 119.4 kg   Body mass index is 40.02 kg/m.   General appearance:Awake, alert, not in any distress.  Eyes:no scleral icterus. HEENT: Atraumatic and Normocephalic Neck: supple, no JVD. Resp:Good air entry bilaterally,no rales or rhonchi CVS: S1 S2 regular, no murmurs.  GI: Bowel sounds present, Non tender and not distended with no gaurding, rigidity or rebound. Extremities: B/L Lower Ext shows no edema, both legs are warm to touch Neurology:  Non focal Musculoskeletal:No digital cyanosis Skin:No Rash, warm and dry Wounds:N/A  LABORATORY DATA: CBC: Recent Labs  Lab  10/22/18 2357  WBC 6.6  HGB 10.1*  HCT 33.2*  MCV 90.2  PLT 759    Basic Metabolic Panel: Recent Labs  Lab 10/22/18 2357  NA 135  K 4.7  CL 105  CO2 26  GLUCOSE 115*  BUN 33*  CREATININE 0.96  CALCIUM 9.5    GFR: Estimated Creatinine Clearance: 87.4 mL/min (by C-G formula based on SCr of 0.96 mg/dL).  Liver Function Tests: No results for input(s): AST, ALT, ALKPHOS, BILITOT, PROT, ALBUMIN in the last 168 hours. No results for input(s): LIPASE, AMYLASE in the last 168 hours. No results for input(s): AMMONIA in the last 168 hours.  Coagulation Profile: No results for input(s): INR, PROTIME in the last 168 hours.  Cardiac Enzymes: No results for input(s): CKTOTAL, CKMB, CKMBINDEX, TROPONINI in the last 168 hours.  BNP (last 3 results) No results for input(s): PROBNP in the last 8760 hours.  HbA1C: No results for input(s): HGBA1C in the last 72 hours.  CBG: Recent Labs  Lab 10/25/18 0740 10/26/18 0809 10/27/18 0807 10/28/18 0806 10/29/18 0815  GLUCAP 94 97 88 96 81    Lipid Profile: No results for input(s): CHOL, HDL, LDLCALC, TRIG, CHOLHDL, LDLDIRECT in the last 72 hours.  Thyroid Function Tests: No results for input(s): TSH, T4TOTAL, FREET4, T3FREE, THYROIDAB in the last 72 hours.  Anemia Panel: No results for input(s): VITAMINB12, FOLATE, FERRITIN, TIBC, IRON, RETICCTPCT in the last 72 hours.  Urine analysis:    Component Value Date/Time   COLORURINE YELLOW 10/01/2018 1822   APPEARANCEUR CLEAR 10/01/2018 1822   APPEARANCEUR Clear 01/18/2018 1539   LABSPEC 1.010 10/01/2018 1822   PHURINE 5.0 10/01/2018 1822   GLUCOSEU NEGATIVE 10/01/2018 1822   HGBUR NEGATIVE 10/01/2018 1822   BILIRUBINUR NEGATIVE 10/01/2018 1822   BILIRUBINUR Negative 01/18/2018 1539   KETONESUR NEGATIVE 10/01/2018 1822   PROTEINUR NEGATIVE 10/01/2018 1822   UROBILINOGEN 0.2 09/06/2012 0213   NITRITE NEGATIVE 10/01/2018 1822   LEUKOCYTESUR NEGATIVE 10/01/2018 1822    LEUKOCYTESUR Negative 01/18/2018 1539    Sepsis Labs: Lactic Acid, Venous    Component Value Date/Time   LATICACIDVEN 1.7 10/01/2018 1833    MICROBIOLOGY: No results found for this or any previous visit (from the past 240 hour(s)).  RADIOLOGY STUDIES/RESULTS: Dg Chest 1 View  Result Date: 10/01/2018 CLINICAL DATA:  Follow-up pneumothorax. EXAM: CHEST  1 VIEW COMPARISON:  09/09/2018 FINDINGS: Mild bilateral interstitial thickening. Small bilateral pleural effusions. Left lateral pleural thickening with a subtle lucency which may reflect a small loculated pneumothorax versus artifact. Right infrahilar hazy airspace disease likely reflecting atelectasis. Stable cardiomediastinal silhouette. No  aggressive osseous lesion. Mild osteoarthritis of bilateral glenohumeral joints. IMPRESSION: Left lateral pleural thickening with a subtle lucency which may reflect a small loculated pneumothorax versus artifact. Recommend follow-up chest x-ray for re-evaluation. Bilateral interstitial thickening and small bilateral pleural effusions concerning for mild pulmonary edema. Electronically Signed   By: Kathreen Devoid   On: 10/01/2018 15:38   Dg Knee 1-2 Views Left  Result Date: 10/16/2018 CLINICAL DATA:  Pain after fall EXAM: LEFT KNEE - 1-2 VIEW COMPARISON:  None. FINDINGS: No acute fracture or dislocation. No joint effusion. Moderate to severe degenerative changes. IMPRESSION: Degenerative changes.  No acute fracture noted. Electronically Signed   By: Dorise Bullion III M.D   On: 10/16/2018 20:32   Dg Knee 1-2 Views Right  Result Date: 10/16/2018 CLINICAL DATA:  Pain after fall EXAM: RIGHT KNEE - 1-2 VIEW COMPARISON:  None. FINDINGS: No acute fracture or effusion. Moderate to severe degenerative changes. IMPRESSION: No acute abnormality. Electronically Signed   By: Dorise Bullion III M.D   On: 10/16/2018 20:32   Ct Chest Wo Contrast  Result Date: 10/07/2018 CLINICAL DATA:  Chronic dyspnea. EXAM: CT CHEST  WITHOUT CONTRAST TECHNIQUE: Multidetector CT imaging of the chest was performed following the standard protocol without IV contrast. COMPARISON:  Portable chest obtained earlier today and chest CTA dated 08/17/2018. FINDINGS: Cardiovascular: Atheromatous calcifications, including the coronary arteries and aorta. Stable borderline enlargement of the heart with mild biatrial enlargement. No pericardial effusion. Mediastinum/Nodes: No enlarged mediastinal or axillary lymph nodes. Thyroid gland, trachea, and esophagus demonstrate no significant findings. Lungs/Pleura: Small to moderate-sized right pleural effusion. Stable left pleural thickening with calcifications. Mild bilateral atelectasis and scarring. Upper Abdomen: Post gastric bypass changes. Musculoskeletal: Mild dextroconvex thoracolumbar scoliosis. Extensive thoracic and lower cervical spine degenerative changes. IMPRESSION: 1. Small to moderate-sized right pleural effusion. 2. Mild bilateral atelectasis and scarring. 3. 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 4.  Calcific coronary artery and aortic atherosclerosis. Aortic Atherosclerosis (ICD10-I70.0). Electronically Signed   By: Claudie Revering M.D.   On: 10/07/2018 20:31   Ct Hip Left W Contrast  Result Date: 10/01/2018 CLINICAL DATA:  Left hip pain EXAM: CT OF THE LOWER LEFT EXTREMITY WITH CONTRAST TECHNIQUE: Multidetector CT imaging of the lower left extremity was performed according to the standard protocol following intravenous contrast administration. COMPARISON:  09/03/2018 CONTRAST:  111mL OMNIPAQUE IOHEXOL 300 MG/ML  SOLN FINDINGS: Bones/Joint/Cartilage An uncemented left total hip arthroplasty is identified with the tip traversing a left femoral diaphyseal fracture that is incompletely healed. Subtle residual fracture lucencies are suggested on the coronal reformats, series 6/46 through 48 for instance. There is callus formation developing  about the fracture, series 3/143. Slight residual varus configuration about the fracture is seen of the femur. No new/acute appearing fracture or bone destruction is seen. With reference to the hip joint, no significant joint effusion or abnormal lucencies to suggest loosening. The prosthetic femoral head is seated within the acetabular component without evidence of asymmetric lining wear. No soft tissue mass or mineralization. No hematoma or abnormal fluid collections. Osteoarthritis of the included SI joints and pubic symphysis. No diastasis. Ligaments Suboptimally assessed by CT. Muscles and Tendons Negative for muscle edema, hematoma or definite tear. Soft tissues No soft tissue mass, mineralization or fluid collections. Postop scarring along the lateral aspect of the hip. IMPRESSION: 1. An uncemented left total hip arthroplasty is identified with the tip traversing a left femoral diaphyseal fracture that is incompletely healed. Subtle residual fracture  lucencies are suggested on the coronal reformats, series 6/46 through 48. Slight residual varus configuration of the femur about the fracture is noted. 2. No new/acute appearing fracture or bone destruction is seen. 3. No significant joint effusion to suggest septic arthritis though this is of clinical concern, percutaneous sampling of the joint fluid could be attempted. No definite evidence of hardware loosening however. Electronically Signed   By: Ashley Royalty M.D.   On: 10/01/2018 20:08   Dg Chest Port 1 View  Result Date: 10/16/2018 CLINICAL DATA:  PICC line placement. EXAM: PORTABLE CHEST 1 VIEW COMPARISON:  Radiographs and CT 10/07/2018. FINDINGS: 1223 hours. Left arm PICC tip is not optimally visualized, although appears to extend to the lower SVC level. The heart size and mediastinal contours are stable. There is aortic atherosclerosis. Bilateral pleural thickening and subpleural rounded atelectasis peripherally in the left lung are unchanged. There is  no pneumothorax. Glenohumeral degenerative changes are present bilaterally. IMPRESSION: PICC line tip at the lower SVC level. No pneumothorax or acute findings. Electronically Signed   By: Richardean Sale M.D.   On: 10/16/2018 13:03   Dg Chest Port 1 View  Result Date: 10/07/2018 CLINICAL DATA:  Shortness of breath. EXAM: PORTABLE CHEST 1 VIEW COMPARISON:  10/01/2017. FINDINGS: Left lower chest incompletely imaged. Cardiomegaly with diffuse bilateral interstitial prominence and bilateral pleural effusions. Findings have progressed from prior exam are most consistent with CHF. A component of pleural scarring may be present. IMPRESSION: Congestive heart failure with bilateral interstitial edema. Bibasilar pneumonia can not be excluded. Bilateral pleural effusions. A component of pleural scarring may be present. Electronically Signed   By: Marcello Moores  Register   On: 10/07/2018 08:58   Dg Chest Port 1 View  Result Date: 10/01/2018 CLINICAL DATA:  LEFT hip replacement 6 months ago, sepsis, question LEFT leg/hip infection EXAM: PORTABLE CHEST 1 VIEW COMPARISON:  Portable exam 1756 hours compared to 10/01/2018 FINDINGS: RIGHT costophrenic angle excluded. Enlargement of cardiac silhouette. Mediastinal contours and pulmonary vascularity normal. Atherosclerotic calcification aorta. Small LEFT pleural effusion and basilar atelectasis at lower LEFT chest question partially loculated. No definite infiltrate or pneumothorax. Bones demineralized. IMPRESSION: LEFT pleural effusion and basilar atelectasis, question partially locule at the lower lateral LEFT chest. Electronically Signed   By: Lavonia Dana M.D.   On: 10/01/2018 18:05   Dg Foot 2 Views Left  Result Date: 10/17/2018 CLINICAL DATA:  Pain in left foot. EXAM: LEFT FOOT - 2 VIEW COMPARISON:  None. FINDINGS: Osteopenia. No fractures in the toes or metacarpals. Enthesopathic change at the posterior calcaneus. There is a lucency along the inferior aspect of the calcaneus  which does not appear to extend through the calcaneus and is relatively well corticated. No other bony abnormalities. IMPRESSION: 1. There is a lucency along the inferior aspect of the calcaneus which is relatively well corticated and does not appear to extend through the calcaneus. This is favored to be nonacute. Recommend clinical correlation to exclude pain in this region. Electronically Signed   By: Dorise Bullion III M.D   On: 10/17/2018 15:05   Dg Fluoro Guided Needle Plc Aspiration/injection Loc  Result Date: 10/15/2018 CLINICAL DATA:  Left hip pain, evaluate for fluid collection EXAM: LEFT HIP ASPIRATION UNDER FLUOROSCOPY FLUOROSCOPY TIME:  Fluoroscopy Time:  18 seconds Radiation Exposure Index (if provided by the fluoroscopic device): 2.3 mGy Number of Acquired Spot Images: 1 PROCEDURE: Overlying skin prepped with Betadine, draped in the usual sterile fashion, and infiltrated locally with buffered Lidocaine. 20 gauge  spinal needle advanced to the prosthetic junction of the femoral head/neck. 1 ml of Lidocaine injected easily. No fluid/frank infection could be aspirated. 4 mL Lidocaine and 6 mL sterile saline administered into the joint with attempted subsequent aspiration. Less than 1 mL fluid was aspirated and sent for laboratory evaluation. No immediate complication. IMPRESSION: Technically successful left hip aspiration under fluoroscopy, as described above. Electronically Signed   By: Julian Hy M.D.   On: 10/15/2018 11:18   Dg Hips Bilat With Pelvis 2v  Result Date: 10/16/2018 CLINICAL DATA:  Fall.  Pain. EXAM: DG HIP (WITH OR WITHOUT PELVIS) 2V BILAT COMPARISON:  None. FINDINGS: The patient is status post bilateral hip replacements. Visualized hardware is in good position. No acute fractures. IMPRESSION: Bilateral hip replacements.  No acute fractures. Electronically Signed   By: Dorise Bullion III M.D   On: 10/16/2018 20:30   [place] Ekg Site Rite  Result Date: 10/15/2018 If Site Rite  image not attached, placement could not be confirmed due to current cardiac rhythm.    LOS: 25 days   Oren Binet, MD  [facility]  If 7PM-7AM, please contact night-coverage  Please page via [email or web address]-Password [email or web address]-click on MD name and type text message  10/29/2018, 12:58 PM

## 2018-10-30 LAB — CBC
HCT: 33.8 % — ABNORMAL LOW (ref 39.0–52.0)
Hemoglobin: 10.6 g/dL — ABNORMAL LOW (ref 13.0–17.0)
MCH: 28.3 pg (ref 26.0–34.0)
MCHC: 31.4 g/dL (ref 30.0–36.0)
MCV: 90.1 fL (ref 80.0–100.0)
Platelets: 260 10*3/uL (ref 150–400)
RBC: 3.75 MIL/uL — ABNORMAL LOW (ref 4.22–5.81)
RDW: 17.7 % — ABNORMAL HIGH (ref 11.5–15.5)
WBC: 6.7 10*3/uL (ref 4.0–10.5)
nRBC: 0 % (ref 0.0–0.2)

## 2018-10-30 LAB — BASIC METABOLIC PANEL
Anion gap: 8 (ref 5–15)
BUN: 27 mg/dL — ABNORMAL HIGH (ref 8–23)
CO2: 26 mmol/L (ref 22–32)
Calcium: 10 mg/dL (ref 8.9–10.3)
Chloride: 103 mmol/L (ref 98–111)
Creatinine, Ser: 0.95 mg/dL (ref 0.61–1.24)
GFR calc Af Amer: >60 mL/min (ref >60)
GFR calc non Af Amer: >60 mL/min (ref >60)
Glucose, Bld: 95 mg/dL (ref 70–99)
Potassium: 4.3 mmol/L (ref 3.5–5.1)
Sodium: 137 mmol/L (ref 135–145)

## 2018-10-30 LAB — GLUCOSE, CAPILLARY: Glucose-Capillary: 85 mg/dL (ref 70–99)

## 2018-10-30 MED ORDER — LACTULOSE 10 GM/15ML PO SOLN
30.0000 g | Freq: Two times a day (BID) | ORAL | Status: DC
  Administered 2018-10-30 – 2018-11-04 (×5): 30 g via ORAL
  Filled 2018-10-30 (×7): qty 45

## 2018-10-30 NOTE — Progress Notes (Signed)
Handoff given and provided patient with diet coke. Patient refuse to be repositioned and skin check.

## 2018-10-30 NOTE — Progress Notes (Signed)
PROGRESS NOTE        PATIENT DETAILS Name: [name] Age: 73 y.o. Sex: male Date of Birth: [birth year] Admit Date: 10/01/2018 Admitting Physician Vianne Bulls, MD ZDG:LOVFIE, Hollice Espy, MD  Brief Narrative: Patient is a 73 y.o. male with history of A. 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  Found to have a soft tissue infection of the left hip area, acute kidney injury and hyperkalemia.  ID, orthopedics consulted-underwent left hip aspiration on 2/14, infectious disease recommending 6 weeks of IV Invanz.   See below for further details  Subjective: Numerous loose BMs yesterday-we will decrease lactulose to twice daily dosing.  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  Was on suppressive Flagyl for Bacteroides fragilis infection-recommendations from ID are for 6 weeks of IV ertapenem-end date of November 25, 2018.  AKI: Likely hemodynamically mediated-secondary to soft tissue infection of the left hip, diuretic use.  Resolved with supportive care.  Follow electrolytes periodically.  Hyperkalemia: Suspect secondary to AKI with use of Aldactone and potassium supplementation.  Resolved.  History of left total hip arthroplasty with subsequent infection with Bacteroides fragilis: Patient was evaluated by infectious disease at [facility] was previously on Flagyl.  Now on IV Invanz-Flagyl has been discontinued.  Recurrent pleural effusion: Felt to be secondary to hepatic hydrothorax-most recent CT chest on 10/07/2018 shows a small pleural effusion-patient is asymptomatic at this point.  Doubt further work-up required-furthermore he has had a recent extensive work-up including multiple thoracocentesis   Cryptogenic cirrhosis: Prior hepatitis serology done earlier this month was negative-agree with plans outlined in prior discharge summary for outpatient follow-up.  Volume status is stable-he is awake and alert-continue Aldactone  Atrial fibrillation: Rate controlled with Cardizem.Continue Eliquis.  Chronic back and right shoulder pain: Continue Lidoderm patch, Tylenol and as needed narcotics.  Bipolar disorder: Stable-continue Lexapro, Neurontin  Constipation: Has required numerous enemas-but much improved after starting lactulose, claims that he has had several loose stools yesterday-change lactulose to twice daily dosing.    Right shoulder pain: Claims he has severe arthritis-and has required cortisone shots in the past-spoke with Orion Crook, PA-C with orthopedics on 2/26, orthopedic service does not do inpatient cortisone shots.  Will manage with supportive care.  Shoulder exam is benign-with no signs of infection.  OSA: Continue CPAP nightly  Morbid obesity  Acute on chronic debility/deconditioning: Has chronic debility/deconditioning at baseline-this is worsened due to acute illness/AKI/left hip infection.  Per social work-very difficult to place to SNF.  DVT Prophylaxis: Full dose anticoagulation with Eliquis  Code Status: Full code  Family Communication: None at bedside  Disposition Plan: Remain inpatient- SNF on discharge when bed available  Antimicrobial agents: Anti-infectives (From admission, onward)   Start     Dose/Rate Route Frequency Ordered Stop   10/15/18 1500  cefTRIAXone  (ROCEPHIN) 2 g in sodium  chloride 0.9 % 100 mL IVPB  Status:  Discontinued     2 g 200 mL/hr over 30 Minutes Intravenous Every 24 hours 10/15/18 1434 10/15/18 1447   10/15/18 1500  ertapenem (INVANZ) 1,000 mg in sodium chloride 0.9 % 100 mL IVPB     1 g 200 mL/hr over 30 Minutes Intravenous Every 24 hours 10/15/18 1447 11/25/18 2359   10/08/18 0000  doxycycline (VIBRA-TABS) 100 MG tablet     100 mg Oral Every 12 hours 10/08/18 0921     10/06/18 1000  doxycycline (VIBRA-TABS) tablet 100 mg  Status:  Discontinued     100 mg Oral Every 12 hours 10/06/18 0726 10/11/18 1328   10/03/18 1800  metroNIDAZOLE (FLAGYL) tablet 500 mg  Status:  Discontinued     500 mg Oral Every 8 hours 10/03/18 1054 10/15/18 1447   10/03/18 0700  vancomycin (VANCOCIN) 2,000 mg in sodium chloride 0.9 % 500 mL IVPB  Status:  Discontinued     2,000 mg 250 mL/hr over 120 Minutes Intravenous Every 36 hours 10/01/18 1942 10/06/18 0726   10/02/18 0200  ceFEPIme (MAXIPIME) 2 g in sodium chloride 0.9 % 100 mL IVPB  Status:  Discontinued     2 g 200 mL/hr over 30 Minutes Intravenous Every 8 hours 10/01/18 1942 10/02/18 1008   10/01/18 1830  vancomycin (VANCOCIN) 2,500 mg in sodium chloride 0.9 % 500 mL IVPB     2,500 mg 250 mL/hr over 120 Minutes Intravenous STAT 10/01/18 1825 10/02/18 0126   10/01/18 1800  ceFEPIme (MAXIPIME) 2 g in sodium chloride 0.9 % 100 mL IVPB     2 g 200 mL/hr over 30 Minutes Intravenous  Once 10/01/18 1747 10/01/18 1903   10/01/18 1800  metroNIDAZOLE (FLAGYL) IVPB 500 mg  Status:  Discontinued     500 mg 100 mL/hr over 60 Minutes Intravenous Every 8 hours 10/01/18 1747 10/03/18 1054   10/01/18 1800  vancomycin (VANCOCIN) IVPB 1000 mg/200 mL premix  Status:  Discontinued     1,000 mg 200 mL/hr over 60 Minutes Intravenous  Once 10/01/18 1747 10/01/18 1825      Procedures: None  CONSULTS:  None  Time spent: 15- minutes-Greater than 50% of this time was spent in counseling, explanation of  diagnosis, planning of further management, and coordination of care.  MEDICATIONS: Scheduled Meds: . apixaban  5 mg Oral BID  . cycloSPORINE  1 drop Both Eyes BID  . diltiazem  180 mg Oral Daily  . escitalopram  10 mg Oral Daily  . gabapentin  100 mg Oral TID  . lactulose  30 g Oral TID  . lidocaine  1 patch Transdermal Q24H  . Melatonin  6 mg Oral QHS  . midodrine  10 mg Oral TID WC  . multivitamin with minerals  1 tablet Oral Daily  . pantoprazole  40 mg Oral Daily  . ENSURE MAX PROTEIN  11 oz Oral BID  . spironolactone  50 mg Oral Daily  . thiamine  100 mg Oral Daily   Continuous Infusions: . sodium chloride 250 mL (10/24/18 0052)  . ertapenem 1,000 mg (10/29/18 1458)   PRN Meds:.sodium chloride, acetaminophen, albuterol, alum & mag hydroxide-simeth, bisacodyl, diltiazem, hydrOXYzine, menthol-cetylpyridinium, [DISCONTINUED] ondansetron **OR** ondansetron (ZOFRAN) IV, oxyCODONE, polyethylene glycol, senna, sodium chloride flush, traZODone, zolpidem   PHYSICAL EXAM: Vital signs: Vitals:   10/29/18 2137 10/29/18 2139 10/30/18 0451 10/30/18 0857  BP: (!) 89/55 (!) 90/58 103/71 (!) 93/59  Pulse: 100  96  Resp: 16  16   Temp: 98.4 F (36.9 C)  97.7 F (36.5 C)   TempSrc: Oral  Oral   SpO2: 94%  96%   Weight:      Height:       Filed Weights   10/27/18 0500 10/28/18 0500 10/29/18 0435  Weight: 120.5 kg 121 kg 119.4 kg   Body mass index is 40.02 kg/m.   General appearance:Awake, alert, not in any distress.  Eyes:no scleral icterus. HEENT: Atraumatic and Normocephalic Neck: supple, no JVD. Resp:Good air entry bilaterally,no rales or rhonchi CVS: S1 S2 regular, no murmurs.  GI: Bowel sounds present, Non tender and not distended with no gaurding, rigidity or rebound. Extremities: B/L Lower Ext shows no edema, both legs are warm to touch Neurology:  Non focal Musculoskeletal:No digital cyanosis Skin:No Rash, warm and dry Wounds:N/A  LABORATORY DATA: CBC: Recent  Labs  Lab 10/30/18 0341  WBC 6.7  HGB 10.6*  HCT 33.8*  MCV 90.1  PLT 564    Basic Metabolic Panel: Recent Labs  Lab 10/30/18 0341  NA 137  K 4.3  CL 103  CO2 26  GLUCOSE 95  BUN 27*  CREATININE 0.95  CALCIUM 10.0    GFR: Estimated Creatinine Clearance: 88.3 mL/min (by C-G formula based on SCr of 0.95 mg/dL).  Liver Function Tests: No results for input(s): AST, ALT, ALKPHOS, BILITOT, PROT, ALBUMIN in the last 168 hours. No results for input(s): LIPASE, AMYLASE in the last 168 hours. No results for input(s): AMMONIA in the last 168 hours.  Coagulation Profile: No results for input(s): INR, PROTIME in the last 168 hours.  Cardiac Enzymes: No results for input(s): CKTOTAL, CKMB, CKMBINDEX, TROPONINI in the last 168 hours.  BNP (last 3 results) No results for input(s): PROBNP in the last 8760 hours.  HbA1C: No results for input(s): HGBA1C in the last 72 hours.  CBG: Recent Labs  Lab 10/26/18 0809 10/27/18 0807 10/28/18 0806 10/29/18 0815 10/30/18 0753  GLUCAP 97 88 96 81 85    Lipid Profile: No results for input(s): CHOL, HDL, LDLCALC, TRIG, CHOLHDL, LDLDIRECT in the last 72 hours.  Thyroid Function Tests: No results for input(s): TSH, T4TOTAL, FREET4, T3FREE, THYROIDAB in the last 72 hours.  Anemia Panel: No results for input(s): VITAMINB12, FOLATE, FERRITIN, TIBC, IRON, RETICCTPCT in the last 72 hours.  Urine analysis:    Component Value Date/Time   COLORURINE YELLOW 10/01/2018 1822   APPEARANCEUR CLEAR 10/01/2018 1822   APPEARANCEUR Clear 01/18/2018 1539   LABSPEC 1.010 10/01/2018 1822   PHURINE 5.0 10/01/2018 1822   GLUCOSEU NEGATIVE 10/01/2018 1822   HGBUR NEGATIVE 10/01/2018 1822   BILIRUBINUR NEGATIVE 10/01/2018 1822   BILIRUBINUR Negative 01/18/2018 1539   KETONESUR NEGATIVE 10/01/2018 1822   PROTEINUR NEGATIVE 10/01/2018 1822   UROBILINOGEN 0.2 09/06/2012 0213   NITRITE NEGATIVE 10/01/2018 1822   LEUKOCYTESUR NEGATIVE 10/01/2018  1822   LEUKOCYTESUR Negative 01/18/2018 1539    Sepsis Labs: Lactic Acid, Venous    Component Value Date/Time   LATICACIDVEN 1.7 10/01/2018 1833    MICROBIOLOGY: No results found for this or any previous visit (from the past 240 hour(s)).  RADIOLOGY STUDIES/RESULTS: Dg Chest 1 View  Result Date: 10/01/2018 CLINICAL DATA:  Follow-up pneumothorax. EXAM: CHEST  1 VIEW COMPARISON:  09/09/2018 FINDINGS: Mild bilateral interstitial thickening. Small bilateral pleural effusions. Left lateral pleural thickening with a subtle lucency which may reflect a small loculated pneumothorax versus artifact. Right infrahilar hazy airspace disease likely reflecting atelectasis. Stable cardiomediastinal silhouette.  No aggressive osseous lesion. Mild osteoarthritis of bilateral glenohumeral joints. IMPRESSION: Left lateral pleural thickening with a subtle lucency which may reflect a small loculated pneumothorax versus artifact. Recommend follow-up chest x-ray for re-evaluation. Bilateral interstitial thickening and small bilateral pleural effusions concerning for mild pulmonary edema. Electronically Signed   By: Kathreen Devoid   On: 10/01/2018 15:38   Dg Knee 1-2 Views Left  Result Date: 10/16/2018 CLINICAL DATA:  Pain after fall EXAM: LEFT KNEE - 1-2 VIEW COMPARISON:  None. FINDINGS: No acute fracture or dislocation. No joint effusion. Moderate to severe degenerative changes. IMPRESSION: Degenerative changes.  No acute fracture noted. Electronically Signed   By: Dorise Bullion III M.D   On: 10/16/2018 20:32   Dg Knee 1-2 Views Right  Result Date: 10/16/2018 CLINICAL DATA:  Pain after fall EXAM: RIGHT KNEE - 1-2 VIEW COMPARISON:  None. FINDINGS: No acute fracture or effusion. Moderate to severe degenerative changes. IMPRESSION: No acute abnormality. Electronically Signed   By: Dorise Bullion III M.D   On: 10/16/2018 20:32   Ct Chest Wo Contrast  Result Date: 10/07/2018 CLINICAL DATA:  Chronic dyspnea. EXAM: CT  CHEST WITHOUT CONTRAST TECHNIQUE: Multidetector CT imaging of the chest was performed following the standard protocol without IV contrast. COMPARISON:  Portable chest obtained earlier today and chest CTA dated 08/17/2018. FINDINGS: Cardiovascular: Atheromatous calcifications, including the coronary arteries and aorta. Stable borderline enlargement of the heart with mild biatrial enlargement. No pericardial effusion. Mediastinum/Nodes: No enlarged mediastinal or axillary lymph nodes. Thyroid gland, trachea, and esophagus demonstrate no significant findings. Lungs/Pleura: Small to moderate-sized right pleural effusion. Stable left pleural thickening with calcifications. Mild bilateral atelectasis and scarring. Upper Abdomen: Post gastric bypass changes. Musculoskeletal: Mild dextroconvex thoracolumbar scoliosis. Extensive thoracic and lower cervical spine degenerative changes. IMPRESSION: 1. Small to moderate-sized right pleural effusion. 2. Mild bilateral atelectasis and scarring. 3. 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 4.  Calcific coronary artery and aortic atherosclerosis. Aortic Atherosclerosis (ICD10-I70.0). Electronically Signed   By: Claudie Revering M.D.   On: 10/07/2018 20:31   Ct Hip Left W Contrast  Result Date: 10/01/2018 CLINICAL DATA:  Left hip pain EXAM: CT OF THE LOWER LEFT EXTREMITY WITH CONTRAST TECHNIQUE: Multidetector CT imaging of the lower left extremity was performed according to the standard protocol following intravenous contrast administration. COMPARISON:  09/03/2018 CONTRAST:  129mL OMNIPAQUE IOHEXOL 300 MG/ML  SOLN FINDINGS: Bones/Joint/Cartilage An uncemented left total hip arthroplasty is identified with the tip traversing a left femoral diaphyseal fracture that is incompletely healed. Subtle residual fracture lucencies are suggested on the coronal reformats, series 6/46 through 48 for instance. There is callus formation  developing about the fracture, series 3/143. Slight residual varus configuration about the fracture is seen of the femur. No new/acute appearing fracture or bone destruction is seen. With reference to the hip joint, no significant joint effusion or abnormal lucencies to suggest loosening. The prosthetic femoral head is seated within the acetabular component without evidence of asymmetric lining wear. No soft tissue mass or mineralization. No hematoma or abnormal fluid collections. Osteoarthritis of the included SI joints and pubic symphysis. No diastasis. Ligaments Suboptimally assessed by CT. Muscles and Tendons Negative for muscle edema, hematoma or definite tear. Soft tissues No soft tissue mass, mineralization or fluid collections. Postop scarring along the lateral aspect of the hip. IMPRESSION: 1. An uncemented left total hip arthroplasty is identified with the tip traversing a left femoral diaphyseal fracture that is incompletely healed. Subtle residual  fracture lucencies are suggested on the coronal reformats, series 6/46 through 48. Slight residual varus configuration of the femur about the fracture is noted. 2. No new/acute appearing fracture or bone destruction is seen. 3. No significant joint effusion to suggest septic arthritis though this is of clinical concern, percutaneous sampling of the joint fluid could be attempted. No definite evidence of hardware loosening however. Electronically Signed   By: Ashley Royalty M.D.   On: 10/01/2018 20:08   Dg Chest Port 1 View  Result Date: 10/16/2018 CLINICAL DATA:  PICC line placement. EXAM: PORTABLE CHEST 1 VIEW COMPARISON:  Radiographs and CT 10/07/2018. FINDINGS: 1223 hours. Left arm PICC tip is not optimally visualized, although appears to extend to the lower SVC level. The heart size and mediastinal contours are stable. There is aortic atherosclerosis. Bilateral pleural thickening and subpleural rounded atelectasis peripherally in the left lung are  unchanged. There is no pneumothorax. Glenohumeral degenerative changes are present bilaterally. IMPRESSION: PICC line tip at the lower SVC level. No pneumothorax or acute findings. Electronically Signed   By: Richardean Sale M.D.   On: 10/16/2018 13:03   Dg Chest Port 1 View  Result Date: 10/07/2018 CLINICAL DATA:  Shortness of breath. EXAM: PORTABLE CHEST 1 VIEW COMPARISON:  10/01/2017. FINDINGS: Left lower chest incompletely imaged. Cardiomegaly with diffuse bilateral interstitial prominence and bilateral pleural effusions. Findings have progressed from prior exam are most consistent with CHF. A component of pleural scarring may be present. IMPRESSION: Congestive heart failure with bilateral interstitial edema. Bibasilar pneumonia can not be excluded. Bilateral pleural effusions. A component of pleural scarring may be present. Electronically Signed   By: Marcello Moores  Register   On: 10/07/2018 08:58   Dg Chest Port 1 View  Result Date: 10/01/2018 CLINICAL DATA:  LEFT hip replacement 6 months ago, sepsis, question LEFT leg/hip infection EXAM: PORTABLE CHEST 1 VIEW COMPARISON:  Portable exam 1756 hours compared to 10/01/2018 FINDINGS: RIGHT costophrenic angle excluded. Enlargement of cardiac silhouette. Mediastinal contours and pulmonary vascularity normal. Atherosclerotic calcification aorta. Small LEFT pleural effusion and basilar atelectasis at lower LEFT chest question partially loculated. No definite infiltrate or pneumothorax. Bones demineralized. IMPRESSION: LEFT pleural effusion and basilar atelectasis, question partially locule at the lower lateral LEFT chest. Electronically Signed   By: Lavonia Dana M.D.   On: 10/01/2018 18:05   Dg Foot 2 Views Left  Result Date: 10/17/2018 CLINICAL DATA:  Pain in left foot. EXAM: LEFT FOOT - 2 VIEW COMPARISON:  None. FINDINGS: Osteopenia. No fractures in the toes or metacarpals. Enthesopathic change at the posterior calcaneus. There is a lucency along the inferior  aspect of the calcaneus which does not appear to extend through the calcaneus and is relatively well corticated. No other bony abnormalities. IMPRESSION: 1. There is a lucency along the inferior aspect of the calcaneus which is relatively well corticated and does not appear to extend through the calcaneus. This is favored to be nonacute. Recommend clinical correlation to exclude pain in this region. Electronically Signed   By: Dorise Bullion III M.D   On: 10/17/2018 15:05   Dg Fluoro Guided Needle Plc Aspiration/injection Loc  Result Date: 10/15/2018 CLINICAL DATA:  Left hip pain, evaluate for fluid collection EXAM: LEFT HIP ASPIRATION UNDER FLUOROSCOPY FLUOROSCOPY TIME:  Fluoroscopy Time:  18 seconds Radiation Exposure Index (if provided by the fluoroscopic device): 2.3 mGy Number of Acquired Spot Images: 1 PROCEDURE: Overlying skin prepped with Betadine, draped in the usual sterile fashion, and infiltrated locally with buffered Lidocaine. [place]  gauge spinal needle advanced to the prosthetic junction of the femoral head/neck. 1 ml of Lidocaine injected easily. No fluid/frank infection could be aspirated. 4 mL Lidocaine and 6 mL sterile saline administered into the joint with attempted subsequent aspiration. Less than 1 mL fluid was aspirated and sent for laboratory evaluation. No immediate complication. IMPRESSION: Technically successful left hip aspiration under fluoroscopy, as described above. Electronically Signed   By: Julian Hy M.D.   On: 10/15/2018 11:18   Dg Hips Bilat With Pelvis 2v  Result Date: 10/16/2018 CLINICAL DATA:  Fall.  Pain. EXAM: DG HIP (WITH OR WITHOUT PELVIS) 2V BILAT COMPARISON:  None. FINDINGS: The patient is status post bilateral hip replacements. Visualized hardware is in good position. No acute fractures. IMPRESSION: Bilateral hip replacements.  No acute fractures. Electronically Signed   By: Dorise Bullion III M.D   On: 10/16/2018 20:30   [place] Ekg Site Rite  Result Date:  10/15/2018 If Site Rite image not attached, placement could not be confirmed due to current cardiac rhythm.    LOS: 26 days   Oren Binet, MD  [facility]  If 7PM-7AM, please contact night-coverage  Please page via [email or web address]-Password [email or web address]-click on MD name and type text message  10/30/2018, 10:41 AM

## 2018-10-31 LAB — GLUCOSE, CAPILLARY: Glucose-Capillary: 83 mg/dL (ref 70–99)

## 2018-10-31 MED ORDER — PROMETHAZINE HCL 25 MG/ML IJ SOLN
12.5000 mg | Freq: Three times a day (TID) | INTRAMUSCULAR | Status: DC | PRN
  Administered 2018-10-31 (×2): 12.5 mg via INTRAVENOUS
  Filled 2018-10-31 (×2): qty 1

## 2018-10-31 NOTE — Progress Notes (Signed)
PROGRESS NOTE        PATIENT DETAILS Name: [name] Age: 73 y.o. Sex: male Date of Birth: March 07, 1946 Admit Date: 10/01/2018 Admitting Physician Vianne Bulls, MD [id number], Hollice Espy, MD  Brief Narrative: Patient is a 73 y.o. male with history of A. 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  Found to have a soft tissue infection of the left hip area, acute kidney injury and hyperkalemia.  ID, orthopedics consulted-underwent left hip aspiration on 2/14, infectious disease recommending 6 weeks of IV Invanz.   See below for further details  Subjective:  Patient in bed, appears comfortable, denies any headache, no fever, no chest pain or pressure, no shortness of breath , no abdominal pain. No focal weakness.   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  Was on suppressive Flagyl for Bacteroides fragilis infection-recommendations from ID are for 6 weeks of IV ertapenem-end date of November 25, 2018. Post DC follow up with primary orthopedic surgeon Dr. Ricki Rodriguez and ID physician at [facility].  AKI: Likely hemodynamically mediated-secondary to soft tissue infection of the left hip, diuretic use.  Resolved with supportive care.  Follow electrolytes periodically.  Hyperkalemia: Suspect secondary to AKI with use of Aldactone and potassium supplementation.   Resolved.  History of left total hip arthroplasty with subsequent infection with Bacteroides fragilis: Patient was evaluated by infectious disease at [facility] was previously on Flagyl.  Now on IV Invanz-Flagyl has been discontinued.  Recurrent pleural effusion: Felt to be secondary to hepatic hydrothorax-most recent CT chest on 10/07/2018 shows a small pleural effusion-patient is asymptomatic at this point.  Doubt further work-up required-furthermore he has had a recent extensive work-up including multiple thoracocentesis   Cryptogenic cirrhosis: Prior hepatitis serology done earlier this month was negative-agree with plans outlined in prior discharge summary for outpatient follow-up.  Volume status is stable-he is awake and alert-continue Aldactone  Atrial fibrillation: Rate controlled with Cardizem.Continue Eliquis.  Chronic back and right shoulder pain: Continue Lidoderm patch, Tylenol and as needed narcotics.  Bipolar disorder: Stable-continue Lexapro, Neurontin  Constipation: Has required numerous enemas-but much improved after starting lactulose, multiple loose BMs on 10/30/2018.  Note patient frequently requests that he be disimpacted without having any clinical evidence of the same.  This was informed to me by multiple nurses.  Right shoulder pain: Claims he has severe arthritis-and has required cortisone shots in the past-spoke with Orion Crook, PA-C with orthopedics on 2/26, orthopedic service does not do inpatient cortisone shots.  Will manage with supportive care.  Shoulder exam is benign-with no signs of infection.  OSA: Continue CPAP nightly  Morbid obesity  Acute on chronic debility/deconditioning: Has chronic debility/deconditioning at baseline-this is worsened due to acute illness/AKI/left hip infection.  Per social work-very difficult to place to SNF.    DVT Prophylaxis: Full dose anticoagulation with Eliquis  Code Status:  Full code  Family  Communication: None at bedside  Disposition Plan: Remain inpatient- SNF on discharge when bed available  Antimicrobial agents: Anti-infectives (From admission, onward)   Start     Dose/Rate Route Frequency Ordered Stop   10/15/18 1500  cefTRIAXone (ROCEPHIN) 2 g in sodium chloride 0.9 % 100 mL IVPB  Status:  Discontinued     2 g 200 mL/hr over 30 Minutes Intravenous Every 24 hours 10/15/18 1434 10/15/18 1447   10/15/18 1500  ertapenem (INVANZ) 1,000 mg in sodium chloride 0.9 % 100 mL IVPB     1 g 200 mL/hr over 30 Minutes Intravenous Every 24 hours 10/15/18 1447 11/25/18 2359   10/08/18 0000  doxycycline (VIBRA-TABS) 100 MG tablet     100 mg Oral Every 12 hours 10/08/18 0921     10/06/18 1000  doxycycline (VIBRA-TABS) tablet 100 mg  Status:  Discontinued     100 mg Oral Every 12 hours 10/06/18 0726 10/11/18 1328   10/03/18 1800  metroNIDAZOLE (FLAGYL) tablet 500 mg  Status:  Discontinued     500 mg Oral Every 8 hours 10/03/18 1054 10/15/18 1447   10/03/18 0700  vancomycin (VANCOCIN) 2,000 mg in sodium chloride 0.9 % 500 mL IVPB  Status:  Discontinued     2,000 mg 250 mL/hr over 120 Minutes Intravenous Every 36 hours 10/01/18 1942 10/06/18 0726   10/02/18 0200  ceFEPIme (MAXIPIME) 2 g in sodium chloride 0.9 % 100 mL IVPB  Status:  Discontinued     2 g 200 mL/hr over 30 Minutes Intravenous Every 8 hours 10/01/18 1942 10/02/18 1008   10/01/18 1830  vancomycin (VANCOCIN) 2,500 mg in sodium chloride 0.9 % 500 mL IVPB     2,500 mg 250 mL/hr over 120 Minutes Intravenous STAT 10/01/18 1825 10/02/18 0126   10/01/18 1800  ceFEPIme (MAXIPIME) 2 g in sodium chloride 0.9 % 100 mL IVPB     2 g 200 mL/hr over 30 Minutes Intravenous  Once 10/01/18 1747 10/01/18 1903   10/01/18 1800  metroNIDAZOLE (FLAGYL) IVPB 500 mg  Status:  Discontinued     500 mg 100 mL/hr over 60 Minutes Intravenous Every 8 hours 10/01/18 1747 10/03/18 1054   10/01/18 1800  vancomycin (VANCOCIN) IVPB 1000 mg/200 mL premix   Status:  Discontinued     1,000 mg 200 mL/hr over 60 Minutes Intravenous  Once 10/01/18 1747 10/01/18 1825      Procedures: None  CONSULTS:  None  Time spent: 15- minutes-Greater than 50% of this time was spent in counseling, explanation of diagnosis, planning of further management, and coordination of care.  MEDICATIONS: Scheduled Meds: . apixaban  5 mg Oral BID  . cycloSPORINE  1 drop Both Eyes BID  . diltiazem  180 mg Oral Daily  . escitalopram  10 mg Oral Daily  . gabapentin  100 mg Oral TID  . lactulose  30 g Oral BID  . lidocaine  1 patch Transdermal Q24H  . Melatonin  6 mg Oral QHS  . midodrine  10 mg Oral TID WC  . multivitamin with minerals  1 tablet Oral Daily  . pantoprazole  40 mg Oral Daily  . ENSURE MAX PROTEIN  11 oz Oral BID  . spironolactone  50 mg Oral Daily  . thiamine  100 mg Oral Daily   Continuous Infusions: . sodium chloride 250 mL (10/24/18 0052)  . ertapenem 1,000 mg (10/30/18 1440)   PRN Meds:.sodium chloride, acetaminophen, albuterol, alum & mag hydroxide-simeth, bisacodyl, diltiazem,  hydrOXYzine, menthol-cetylpyridinium, [DISCONTINUED] ondansetron **OR** ondansetron (ZOFRAN) IV, oxyCODONE, polyethylene glycol, senna, sodium chloride flush, traZODone, zolpidem   PHYSICAL EXAM: Vital signs: Vitals:   10/30/18 0857 10/30/18 2100 10/30/18 2209 10/31/18 0429  BP: (!) 93/59  100/76 104/70  Pulse:   84 96  Resp:   16 18  Temp:   98.5 F (36.9 C) 98.6 F (37 C)  TempSrc:   Oral   SpO2:   93% 96%  Weight:  119.6 kg  119.6 kg  Height:       Filed Weights   10/29/18 0435 10/30/18 2100 10/31/18 0429  Weight: 119.4 kg 119.6 kg 119.6 kg   Body mass index is 40.09 kg/m.   Exam  Awake Alert, Oriented X 3, No new F.N deficits, Normal affect [place].AT,PERRAL Supple Neck,No JVD, No cervical lymphadenopathy appriciated.  Symmetrical Chest wall movement, Good air movement bilaterally, CTAB RRR,No Gallops, Rubs or new Murmurs, No Parasternal  Heave +ve B.Sounds, Abd Soft, No tenderness, No organomegaly appriciated, No rebound - guarding or rigidity. No Cyanosis, Clubbing or edema, chronic left hip redness, no warmth.  This is chronic.   LABORATORY DATA: CBC: Recent Labs  Lab 10/30/18 0341  WBC 6.7  HGB 10.6*  HCT 33.8*  MCV 90.1  PLT 195    Basic Metabolic Panel: Recent Labs  Lab 10/30/18 0341  NA 137  K 4.3  CL 103  CO2 26  GLUCOSE 95  BUN 27*  CREATININE 0.95  CALCIUM 10.0    GFR: Estimated Creatinine Clearance: 88.4 mL/min (by C-G formula based on SCr of 0.95 mg/dL).  Liver Function Tests: No results for input(s): AST, ALT, ALKPHOS, BILITOT, PROT, ALBUMIN in the last 168 hours. No results for input(s): LIPASE, AMYLASE in the last 168 hours. No results for input(s): AMMONIA in the last 168 hours.  Coagulation Profile: No results for input(s): INR, PROTIME in the last 168 hours.  Cardiac Enzymes: No results for input(s): CKTOTAL, CKMB, CKMBINDEX, TROPONINI in the last 168 hours.  BNP (last 3 results) No results for input(s): PROBNP in the last 8760 hours.  HbA1C: No results for input(s): HGBA1C in the last 72 hours.  CBG: Recent Labs  Lab 10/27/18 0807 10/28/18 0806 10/29/18 0815 10/30/18 0753 10/31/18 0816  GLUCAP 88 96 81 85 83    Lipid Profile: No results for input(s): CHOL, HDL, LDLCALC, TRIG, CHOLHDL, LDLDIRECT in the last 72 hours.  Thyroid Function Tests: No results for input(s): TSH, T4TOTAL, FREET4, T3FREE, THYROIDAB in the last 72 hours.  Anemia Panel: No results for input(s): VITAMINB12, FOLATE, FERRITIN, TIBC, IRON, RETICCTPCT in the last 72 hours.  Urine analysis:    Component Value Date/Time   COLORURINE YELLOW 10/01/2018 1822   APPEARANCEUR CLEAR 10/01/2018 1822   APPEARANCEUR Clear 01/18/2018 1539   LABSPEC 1.010 10/01/2018 1822   PHURINE 5.0 10/01/2018 1822   GLUCOSEU NEGATIVE 10/01/2018 1822   HGBUR NEGATIVE 10/01/2018 1822   BILIRUBINUR NEGATIVE  10/01/2018 1822   BILIRUBINUR Negative 01/18/2018 1539   KETONESUR NEGATIVE 10/01/2018 1822   PROTEINUR NEGATIVE 10/01/2018 1822   UROBILINOGEN 0.2 09/06/2012 0213   NITRITE NEGATIVE 10/01/2018 1822   LEUKOCYTESUR NEGATIVE 10/01/2018 1822   LEUKOCYTESUR Negative 01/18/2018 1539    Sepsis Labs: Lactic Acid, Venous    Component Value Date/Time   LATICACIDVEN 1.7 10/01/2018 1833    MICROBIOLOGY: No results found for this or any previous visit (from the past 240 hour(s)).  RADIOLOGY STUDIES/RESULTS: Dg Chest 1 View  Result Date: 10/01/2018 CLINICAL DATA:  Follow-up pneumothorax. EXAM: CHEST  1 VIEW COMPARISON:  09/09/2018 FINDINGS: Mild bilateral interstitial thickening. Small bilateral pleural effusions. Left lateral pleural thickening with a subtle lucency which may reflect a small loculated pneumothorax versus artifact. Right infrahilar hazy airspace disease likely reflecting atelectasis. Stable cardiomediastinal silhouette. No aggressive osseous lesion. Mild osteoarthritis of bilateral glenohumeral joints. IMPRESSION: Left lateral pleural thickening with a subtle lucency which may reflect a small loculated pneumothorax versus artifact. Recommend follow-up chest x-ray for re-evaluation. Bilateral interstitial thickening and small bilateral pleural effusions concerning for mild pulmonary edema. Electronically Signed   By: Kathreen Devoid   On: 10/01/2018 15:38   Dg Knee 1-2 Views Left  Result Date: 10/16/2018 CLINICAL DATA:  Pain after fall EXAM: LEFT KNEE - 1-2 VIEW COMPARISON:  None. FINDINGS: No acute fracture or dislocation. No joint effusion. Moderate to severe degenerative changes. IMPRESSION: Degenerative changes.  No acute fracture noted. Electronically Signed   By: Dorise Bullion III M.D   On: 10/16/2018 20:32   Dg Knee 1-2 Views Right  Result Date: 10/16/2018 CLINICAL DATA:  Pain after fall EXAM: RIGHT KNEE - 1-2 VIEW COMPARISON:  None. FINDINGS: No acute fracture or effusion.  Moderate to severe degenerative changes. IMPRESSION: No acute abnormality. Electronically Signed   By: Dorise Bullion III M.D   On: 10/16/2018 20:32   Ct Chest Wo Contrast  Result Date: 10/07/2018 CLINICAL DATA:  Chronic dyspnea. EXAM: CT CHEST WITHOUT CONTRAST TECHNIQUE: Multidetector CT imaging of the chest was performed following the standard protocol without IV contrast. COMPARISON:  Portable chest obtained earlier today and chest CTA dated 08/17/2018. FINDINGS: Cardiovascular: Atheromatous calcifications, including the coronary arteries and aorta. Stable borderline enlargement of the heart with mild biatrial enlargement. No pericardial effusion. Mediastinum/Nodes: No enlarged mediastinal or axillary lymph nodes. Thyroid gland, trachea, and esophagus demonstrate no significant findings. Lungs/Pleura: Small to moderate-sized right pleural effusion. Stable left pleural thickening with calcifications. Mild bilateral atelectasis and scarring. Upper Abdomen: Post gastric bypass changes. Musculoskeletal: Mild dextroconvex thoracolumbar scoliosis. Extensive thoracic and lower cervical spine degenerative changes. IMPRESSION: 1. Small to moderate-sized right pleural effusion. 2. Mild bilateral atelectasis and scarring. 3. 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 4.  Calcific coronary artery and aortic atherosclerosis. Aortic Atherosclerosis (ICD10-I70.0). Electronically Signed   By: Claudie Revering M.D.   On: 10/07/2018 20:31   Ct Hip Left W Contrast  Result Date: 10/01/2018 CLINICAL DATA:  Left hip pain EXAM: CT OF THE LOWER LEFT EXTREMITY WITH CONTRAST TECHNIQUE: Multidetector CT imaging of the lower left extremity was performed according to the standard protocol following intravenous contrast administration. COMPARISON:  09/03/2018 CONTRAST:  163mL OMNIPAQUE IOHEXOL 300 MG/ML  SOLN FINDINGS: Bones/Joint/Cartilage An uncemented left total hip arthroplasty is  identified with the tip traversing a left femoral diaphyseal fracture that is incompletely healed. Subtle residual fracture lucencies are suggested on the coronal reformats, series 6/46 through 48 for instance. There is callus formation developing about the fracture, series 3/143. Slight residual varus configuration about the fracture is seen of the femur. No new/acute appearing fracture or bone destruction is seen. With reference to the hip joint, no significant joint effusion or abnormal lucencies to suggest loosening. The prosthetic femoral head is seated within the acetabular component without evidence of asymmetric lining wear. No soft tissue mass or mineralization. No hematoma or abnormal fluid collections. Osteoarthritis of the included SI joints and pubic symphysis. No diastasis. Ligaments Suboptimally assessed by CT. Muscles and Tendons Negative for muscle edema, hematoma  or definite tear. Soft tissues No soft tissue mass, mineralization or fluid collections. Postop scarring along the lateral aspect of the hip. IMPRESSION: 1. An uncemented left total hip arthroplasty is identified with the tip traversing a left femoral diaphyseal fracture that is incompletely healed. Subtle residual fracture lucencies are suggested on the coronal reformats, series 6/46 through 48. Slight residual varus configuration of the femur about the fracture is noted. 2. No new/acute appearing fracture or bone destruction is seen. 3. No significant joint effusion to suggest septic arthritis though this is of clinical concern, percutaneous sampling of the joint fluid could be attempted. No definite evidence of hardware loosening however. Electronically Signed   By: Ashley Royalty M.D.   On: 10/01/2018 20:08   Dg Chest Port 1 View  Result Date: 10/16/2018 CLINICAL DATA:  PICC line placement. EXAM: PORTABLE CHEST 1 VIEW COMPARISON:  Radiographs and CT 10/07/2018. FINDINGS: 1223 hours. Left arm PICC tip is not optimally visualized,  although appears to extend to the lower SVC level. The heart size and mediastinal contours are stable. There is aortic atherosclerosis. Bilateral pleural thickening and subpleural rounded atelectasis peripherally in the left lung are unchanged. There is no pneumothorax. Glenohumeral degenerative changes are present bilaterally. IMPRESSION: PICC line tip at the lower SVC level. No pneumothorax or acute findings. Electronically Signed   By: Richardean Sale M.D.   On: 10/16/2018 13:03   Dg Chest Port 1 View  Result Date: 10/07/2018 CLINICAL DATA:  Shortness of breath. EXAM: PORTABLE CHEST 1 VIEW COMPARISON:  10/01/2017. FINDINGS: Left lower chest incompletely imaged. Cardiomegaly with diffuse bilateral interstitial prominence and bilateral pleural effusions. Findings have progressed from prior exam are most consistent with CHF. A component of pleural scarring may be present. IMPRESSION: Congestive heart failure with bilateral interstitial edema. Bibasilar pneumonia can not be excluded. Bilateral pleural effusions. A component of pleural scarring may be present. Electronically Signed   By: Marcello Moores  Register   On: 10/07/2018 08:58   Dg Chest Port 1 View  Result Date: 10/01/2018 CLINICAL DATA:  LEFT hip replacement 6 months ago, sepsis, question LEFT leg/hip infection EXAM: PORTABLE CHEST 1 VIEW COMPARISON:  Portable exam 1756 hours compared to 10/01/2018 FINDINGS: RIGHT costophrenic angle excluded. Enlargement of cardiac silhouette. Mediastinal contours and pulmonary vascularity normal. Atherosclerotic calcification aorta. Small LEFT pleural effusion and basilar atelectasis at lower LEFT chest question partially loculated. No definite infiltrate or pneumothorax. Bones demineralized. IMPRESSION: LEFT pleural effusion and basilar atelectasis, question partially locule at the lower lateral LEFT chest. Electronically Signed   By: Lavonia Dana M.D.   On: 10/01/2018 18:05   Dg Foot 2 Views Left  Result Date:  10/17/2018 CLINICAL DATA:  Pain in left foot. EXAM: LEFT FOOT - 2 VIEW COMPARISON:  None. FINDINGS: Osteopenia. No fractures in the toes or metacarpals. Enthesopathic change at the posterior calcaneus. There is a lucency along the inferior aspect of the calcaneus which does not appear to extend through the calcaneus and is relatively well corticated. No other bony abnormalities. IMPRESSION: 1. There is a lucency along the inferior aspect of the calcaneus which is relatively well corticated and does not appear to extend through the calcaneus. This is favored to be nonacute. Recommend clinical correlation to exclude pain in this region. Electronically Signed   By: Dorise Bullion III M.D   On: 10/17/2018 15:05   Dg Fluoro Guided Needle Plc Aspiration/injection Loc  Result Date: 10/15/2018 CLINICAL DATA:  Left hip pain, evaluate for fluid collection EXAM: LEFT HIP  ASPIRATION UNDER FLUOROSCOPY FLUOROSCOPY TIME:  Fluoroscopy Time:  18 seconds Radiation Exposure Index (if provided by the fluoroscopic device): 2.3 mGy Number of Acquired Spot Images: 1 PROCEDURE: Overlying skin prepped with Betadine, draped in the usual sterile fashion, and infiltrated locally with buffered Lidocaine. 20 gauge spinal needle advanced to the prosthetic junction of the femoral head/neck. 1 ml of Lidocaine injected easily. No fluid/frank infection could be aspirated. 4 mL Lidocaine and 6 mL sterile saline administered into the joint with attempted subsequent aspiration. Less than 1 mL fluid was aspirated and sent for laboratory evaluation. No immediate complication. IMPRESSION: Technically successful left hip aspiration under fluoroscopy, as described above. Electronically Signed   By: Julian Hy M.D.   On: 10/15/2018 11:18   Dg Hips Bilat With Pelvis 2v  Result Date: 10/16/2018 CLINICAL DATA:  Fall.  Pain. EXAM: DG HIP (WITH OR WITHOUT PELVIS) 2V BILAT COMPARISON:  None. FINDINGS: The patient is status post bilateral hip  replacements. Visualized hardware is in good position. No acute fractures. IMPRESSION: Bilateral hip replacements.  No acute fractures. Electronically Signed   By: Dorise Bullion III M.D   On: 10/16/2018 20:30   [place] Ekg Site Rite  Result Date: 10/15/2018 If Site Rite image not attached, placement could not be confirmed due to current cardiac rhythm.    LOS: 27 days   Signature  Lala Lund M.D on 10/31/2018 at 11:25 AM   -  To page go to [email or web address]

## 2018-10-31 NOTE — Progress Notes (Signed)
Verbal order from Dr. Candiss Norse for Phenergan 12.5 every 8 hrs for nausea

## 2018-11-01 ENCOUNTER — Inpatient Hospital Stay (HOSPITAL_COMMUNITY): Payer: Medicare Other

## 2018-11-01 LAB — GLUCOSE, CAPILLARY: Glucose-Capillary: 86 mg/dL (ref 70–99)

## 2018-11-01 MED ORDER — BISACODYL 5 MG PO TBEC: 10.0000 mg | DELAYED_RELEASE_TABLET | Freq: Every day | ORAL | Status: DC | PRN

## 2018-11-01 NOTE — Progress Notes (Signed)
PROGRESS NOTE        PATIENT DETAILS Name: [name] Age: 73 y.o. Sex: male Date of Birth: [birth year] Admit Date: 10/01/2018 Admitting Physician Vianne Bulls, MD RUE:[id number], Hollice Espy, MD  Brief Narrative: Patient is a 73 y.o. male with history of A. 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  Found to have a soft tissue infection of the left hip area, acute kidney injury and hyperkalemia.  ID, orthopedics consulted-underwent left hip aspiration on 2/14, infectious disease recommending 6 weeks of IV Invanz.   See below for further details  Subjective:  Patient in bed, appears comfortable, denies any headache, no fever, no chest pain or pressure, no shortness of breath , no abdominal pain. No focal weakness.  He is constipated and wishes to be disimpacted.   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  Was on suppressive Flagyl for Bacteroides fragilis infection-recommendations from ID are for 6 weeks of IV ertapenem-end date of November 25, 2018. Post DC follow up with primary orthopedic surgeon Dr. Ricki Rodriguez and ID physician at [facility].  AKI: Likely hemodynamically mediated-secondary to soft tissue infection of the left hip, diuretic use.  Resolved with supportive care.  Follow electrolytes periodically.  Hyperkalemia: Suspect secondary to AKI with use  of Aldactone and potassium supplementation.  Resolved.  History of left total hip arthroplasty with subsequent infection with Bacteroides fragilis: Patient was evaluated by infectious disease at [facility] was previously on Flagyl.  Now on IV Invanz-Flagyl has been discontinued.  Recurrent pleural effusion: Felt to be secondary to hepatic hydrothorax-most recent CT chest on 10/07/2018 shows a small pleural effusion-patient is asymptomatic at this point.  Doubt further work-up required-furthermore he has had a recent extensive work-up including multiple thoracocentesis   Cryptogenic cirrhosis: Prior hepatitis serology done earlier this month was negative-agree with plans outlined in prior discharge summary for outpatient follow-up.  Volume status is stable-he is awake and alert-continue Aldactone  Atrial fibrillation: Rate controlled with Cardizem.Continue Eliquis.  Chronic back and right shoulder pain: Continue Lidoderm patch, Tylenol and as needed narcotics.  Bipolar disorder: Stable-continue Lexapro, Neurontin  Constipation: Has required numerous enemas-but much improved after starting lactulose, multiple loose BMs on 10/30/2018.  Note patient frequently requests that he be disimpacted without having any clinical evidence of the same.  This was informed to me by multiple nurses.  On 11/01/2018 patient again complains that he feels constipated and wants to be disimpacted, will check KUB and follow.  Continue bowel regimen.  Right shoulder pain: Claims he has severe arthritis-and has required cortisone shots in the past-spoke with Orion Crook, PA-C with orthopedics on 2/26, orthopedic service does not do inpatient cortisone shots.  Will manage with supportive care.  Shoulder exam is benign-with no signs of infection.  OSA: Continue CPAP nightly  Morbid obesity  Acute on chronic debility/deconditioning: Has chronic  debility/deconditioning at baseline-this is worsened due to  acute illness/AKI/left hip infection.  Per social work-very difficult to place to SNF.    DVT Prophylaxis: Full dose anticoagulation with Eliquis  Code Status: Full code  Family Communication: None at bedside  Disposition Plan: Remain inpatient- SNF on discharge when bed available  Antimicrobial agents: Anti-infectives (From admission, onward)   Start     Dose/Rate Route Frequency Ordered Stop   10/15/18 1500  cefTRIAXone (ROCEPHIN) 2 g in sodium chloride 0.9 % 100 mL IVPB  Status:  Discontinued     2 g 200 mL/hr over 30 Minutes Intravenous Every 24 hours 10/15/18 1434 10/15/18 1447   10/15/18 1500  ertapenem (INVANZ) 1,000 mg in sodium chloride 0.9 % 100 mL IVPB     1 g 200 mL/hr over 30 Minutes Intravenous Every 24 hours 10/15/18 1447 11/25/18 2359   10/08/18 0000  doxycycline (VIBRA-TABS) 100 MG tablet     100 mg Oral Every 12 hours 10/08/18 0921     10/06/18 1000  doxycycline (VIBRA-TABS) tablet 100 mg  Status:  Discontinued     100 mg Oral Every 12 hours 10/06/18 0726 10/11/18 1328   10/03/18 1800  metroNIDAZOLE (FLAGYL) tablet 500 mg  Status:  Discontinued     500 mg Oral Every 8 hours 10/03/18 1054 10/15/18 1447   10/03/18 0700  vancomycin (VANCOCIN) 2,000 mg in sodium chloride 0.9 % 500 mL IVPB  Status:  Discontinued     2,000 mg 250 mL/hr over 120 Minutes Intravenous Every 36 hours 10/01/18 1942 10/06/18 0726   10/02/18 0200  ceFEPIme (MAXIPIME) 2 g in sodium chloride 0.9 % 100 mL IVPB  Status:  Discontinued     2 g 200 mL/hr over 30 Minutes Intravenous Every 8 hours 10/01/18 1942 10/02/18 1008   10/01/18 1830  vancomycin (VANCOCIN) 2,500 mg in sodium chloride 0.9 % 500 mL IVPB     2,500 mg 250 mL/hr over 120 Minutes Intravenous STAT 10/01/18 1825 10/02/18 0126   10/01/18 1800  ceFEPIme (MAXIPIME) 2 g in sodium chloride 0.9 % 100 mL IVPB     2 g 200 mL/hr over 30 Minutes Intravenous  Once 10/01/18 1747 10/01/18 1903   10/01/18 1800  metroNIDAZOLE (FLAGYL) IVPB 500  mg  Status:  Discontinued     500 mg 100 mL/hr over 60 Minutes Intravenous Every 8 hours 10/01/18 1747 10/03/18 1054   10/01/18 1800  vancomycin (VANCOCIN) IVPB 1000 mg/200 mL premix  Status:  Discontinued     1,000 mg 200 mL/hr over 60 Minutes Intravenous  Once 10/01/18 1747 10/01/18 1825      Procedures: None  CONSULTS:  None  Time spent: 15- minutes-Greater than 50% of this time was spent in counseling, explanation of diagnosis, planning of further management, and coordination of care.  MEDICATIONS: Scheduled Meds: . apixaban  5 mg Oral BID  . cycloSPORINE  1 drop Both Eyes BID  . diltiazem  180 mg Oral Daily  . escitalopram  10 mg Oral Daily  . gabapentin  100 mg Oral TID  . lactulose  30 g Oral BID  . lidocaine  1 patch Transdermal Q24H  . Melatonin  6 mg Oral QHS  . midodrine  10 mg Oral TID WC  . multivitamin with minerals  1 tablet Oral Daily  . pantoprazole  40 mg Oral Daily  . ENSURE MAX PROTEIN  11 oz Oral BID  . spironolactone  50 mg Oral Daily  . thiamine  100 mg  Oral Daily   Continuous Infusions: . sodium chloride 250 mL (10/24/18 0052)  . ertapenem 1,000 mg (10/31/18 1523)   PRN Meds:.sodium chloride, acetaminophen, albuterol, alum & mag hydroxide-simeth, bisacodyl, diltiazem, hydrOXYzine, menthol-cetylpyridinium, [DISCONTINUED] ondansetron **OR** ondansetron (ZOFRAN) IV, oxyCODONE, polyethylene glycol, promethazine, senna, sodium chloride flush, traZODone, zolpidem   PHYSICAL EXAM: Vital signs: Vitals:   10/31/18 1523 10/31/18 2138 11/01/18 0503 11/01/18 0700  BP: 103/61 103/67 95/66   Pulse: (!) 102 96 90   Resp: 18 16 15    Temp: 98.1 F (36.7 C) 98.7 F (37.1 C) 97.7 F (36.5 C)   TempSrc:   Oral   SpO2: 95% 95% 97%   Weight:    119.9 kg  Height:       Filed Weights   10/30/18 2100 10/31/18 0429 11/01/18 0700  Weight: 119.6 kg 119.6 kg 119.9 kg   Body mass index is 40.19 kg/m.   Exam  Awake Alert, Oriented X 3, No new F.N deficits,  Normal affect [place].AT,PERRAL Supple Neck,No JVD, No cervical lymphadenopathy appriciated.  Symmetrical Chest wall movement, Good air movement bilaterally, CTAB RRR,No Gallops, Rubs or new Murmurs, No Parasternal Heave +ve B.Sounds, Abd Soft, No tenderness, No organomegaly appriciated, No rebound - guarding or rigidity. No Cyanosis, Clubbing or edema, chronic left hip redness, no warmth.  This is chronic.   LABORATORY DATA: CBC: Recent Labs  Lab 10/30/18 0341  WBC 6.7  HGB 10.6*  HCT 33.8*  MCV 90.1  PLT 737    Basic Metabolic Panel: Recent Labs  Lab 10/30/18 0341  NA 137  K 4.3  CL 103  CO2 26  GLUCOSE 95  BUN 27*  CREATININE 0.95  CALCIUM 10.0    GFR: Estimated Creatinine Clearance: 88.5 mL/min (by C-G formula based on SCr of 0.95 mg/dL).  Liver Function Tests: No results for input(s): AST, ALT, ALKPHOS, BILITOT, PROT, ALBUMIN in the last 168 hours. No results for input(s): LIPASE, AMYLASE in the last 168 hours. No results for input(s): AMMONIA in the last 168 hours.  Coagulation Profile: No results for input(s): INR, PROTIME in the last 168 hours.  Cardiac Enzymes: No results for input(s): CKTOTAL, CKMB, CKMBINDEX, TROPONINI in the last 168 hours.  BNP (last 3 results) No results for input(s): PROBNP in the last 8760 hours.  HbA1C: No results for input(s): HGBA1C in the last 72 hours.  CBG: Recent Labs  Lab 10/28/18 0806 10/29/18 0815 10/30/18 0753 10/31/18 0816 11/01/18 0804  GLUCAP 96 81 85 83 86    Lipid Profile: No results for input(s): CHOL, HDL, LDLCALC, TRIG, CHOLHDL, LDLDIRECT in the last 72 hours.  Thyroid Function Tests: No results for input(s): TSH, T4TOTAL, FREET4, T3FREE, THYROIDAB in the last 72 hours.  Anemia Panel: No results for input(s): VITAMINB12, FOLATE, FERRITIN, TIBC, IRON, RETICCTPCT in the last 72 hours.  Urine analysis:    Component Value Date/Time   COLORURINE YELLOW 10/01/2018 1822   APPEARANCEUR CLEAR  10/01/2018 1822   APPEARANCEUR Clear 01/18/2018 1539   LABSPEC 1.010 10/01/2018 1822   PHURINE 5.0 10/01/2018 1822   GLUCOSEU NEGATIVE 10/01/2018 1822   HGBUR NEGATIVE 10/01/2018 1822   BILIRUBINUR NEGATIVE 10/01/2018 1822   BILIRUBINUR Negative 01/18/2018 1539   KETONESUR NEGATIVE 10/01/2018 1822   PROTEINUR NEGATIVE 10/01/2018 1822   UROBILINOGEN 0.2 09/06/2012 0213   NITRITE NEGATIVE 10/01/2018 1822   LEUKOCYTESUR NEGATIVE 10/01/2018 1822   LEUKOCYTESUR Negative 01/18/2018 1539    Sepsis Labs: Lactic Acid, Venous    Component Value Date/Time  LATICACIDVEN 1.7 10/01/2018 1833    MICROBIOLOGY: No results found for this or any previous visit (from the past 240 hour(s)).  RADIOLOGY STUDIES/RESULTS: Dg Knee 1-2 Views Left  Result Date: 10/16/2018 CLINICAL DATA:  Pain after fall EXAM: LEFT KNEE - 1-2 VIEW COMPARISON:  None. FINDINGS: No acute fracture or dislocation. No joint effusion. Moderate to severe degenerative changes. IMPRESSION: Degenerative changes.  No acute fracture noted. Electronically Signed   By: Dorise Bullion III M.D   On: 10/16/2018 20:32   Dg Knee 1-2 Views Right  Result Date: 10/16/2018 CLINICAL DATA:  Pain after fall EXAM: RIGHT KNEE - 1-2 VIEW COMPARISON:  None. FINDINGS: No acute fracture or effusion. Moderate to severe degenerative changes. IMPRESSION: No acute abnormality. Electronically Signed   By: Dorise Bullion III M.D   On: 10/16/2018 20:32   Ct Chest Wo Contrast  Result Date: 10/07/2018 CLINICAL DATA:  Chronic dyspnea. EXAM: CT CHEST WITHOUT CONTRAST TECHNIQUE: Multidetector CT imaging of the chest was performed following the standard protocol without IV contrast. COMPARISON:  Portable chest obtained earlier today and chest CTA dated 08/17/2018. FINDINGS: Cardiovascular: Atheromatous calcifications, including the coronary arteries and aorta. Stable borderline enlargement of the heart with mild biatrial enlargement. No pericardial effusion.  Mediastinum/Nodes: No enlarged mediastinal or axillary lymph nodes. Thyroid gland, trachea, and esophagus demonstrate no significant findings. Lungs/Pleura: Small to moderate-sized right pleural effusion. Stable left pleural thickening with calcifications. Mild bilateral atelectasis and scarring. Upper Abdomen: Post gastric bypass changes. Musculoskeletal: Mild dextroconvex thoracolumbar scoliosis. Extensive thoracic and lower cervical spine degenerative changes. IMPRESSION: 1. Small to moderate-sized right pleural effusion. 2. Mild bilateral atelectasis and scarring. 3. 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 4.  Calcific coronary artery and aortic atherosclerosis. Aortic Atherosclerosis (ICD10-I70.0). Electronically Signed   By: Claudie Revering M.D.   On: 10/07/2018 20:31   Dg Chest Port 1 View  Result Date: 10/16/2018 CLINICAL DATA:  PICC line placement. EXAM: PORTABLE CHEST 1 VIEW COMPARISON:  Radiographs and CT 10/07/2018. FINDINGS: 1223 hours. Left arm PICC tip is not optimally visualized, although appears to extend to the lower SVC level. The heart size and mediastinal contours are stable. There is aortic atherosclerosis. Bilateral pleural thickening and subpleural rounded atelectasis peripherally in the left lung are unchanged. There is no pneumothorax. Glenohumeral degenerative changes are present bilaterally. IMPRESSION: PICC line tip at the lower SVC level. No pneumothorax or acute findings. Electronically Signed   By: Richardean Sale M.D.   On: 10/16/2018 13:03   Dg Chest Port 1 View  Result Date: 10/07/2018 CLINICAL DATA:  Shortness of breath. EXAM: PORTABLE CHEST 1 VIEW COMPARISON:  10/01/2017. FINDINGS: Left lower chest incompletely imaged. Cardiomegaly with diffuse bilateral interstitial prominence and bilateral pleural effusions. Findings have progressed from prior exam are most consistent with CHF. A component of pleural scarring may be  present. IMPRESSION: Congestive heart failure with bilateral interstitial edema. Bibasilar pneumonia can not be excluded. Bilateral pleural effusions. A component of pleural scarring may be present. Electronically Signed   By: Marcello Moores  Register   On: 10/07/2018 08:58   Dg Foot 2 Views Left  Result Date: 10/17/2018 CLINICAL DATA:  Pain in left foot. EXAM: LEFT FOOT - 2 VIEW COMPARISON:  None. FINDINGS: Osteopenia. No fractures in the toes or metacarpals. Enthesopathic change at the posterior calcaneus. There is a lucency along the inferior aspect of the calcaneus which does not appear to extend through the calcaneus and is relatively well corticated. No other bony  abnormalities. IMPRESSION: 1. There is a lucency along the inferior aspect of the calcaneus which is relatively well corticated and does not appear to extend through the calcaneus. This is favored to be nonacute. Recommend clinical correlation to exclude pain in this region. Electronically Signed   By: Dorise Bullion III M.D   On: 10/17/2018 15:05   Dg Fluoro Guided Needle Plc Aspiration/injection Loc  Result Date: 10/15/2018 CLINICAL DATA:  Left hip pain, evaluate for fluid collection EXAM: LEFT HIP ASPIRATION UNDER FLUOROSCOPY FLUOROSCOPY TIME:  Fluoroscopy Time:  18 seconds Radiation Exposure Index (if provided by the fluoroscopic device): 2.3 mGy Number of Acquired Spot Images: 1 PROCEDURE: Overlying skin prepped with Betadine, draped in the usual sterile fashion, and infiltrated locally with buffered Lidocaine. 20 gauge spinal needle advanced to the prosthetic junction of the femoral head/neck. 1 ml of Lidocaine injected easily. No fluid/frank infection could be aspirated. 4 mL Lidocaine and 6 mL sterile saline administered into the joint with attempted subsequent aspiration. Less than 1 mL fluid was aspirated and sent for laboratory evaluation. No immediate complication. IMPRESSION: Technically successful left hip aspiration under fluoroscopy,  as described above. Electronically Signed   By: Julian Hy M.D.   On: 10/15/2018 11:18   Dg Hips Bilat With Pelvis 2v  Result Date: 10/16/2018 CLINICAL DATA:  Fall.  Pain. EXAM: DG HIP (WITH OR WITHOUT PELVIS) 2V BILAT COMPARISON:  None. FINDINGS: The patient is status post bilateral hip replacements. Visualized hardware is in good position. No acute fractures. IMPRESSION: Bilateral hip replacements.  No acute fractures. Electronically Signed   By: Dorise Bullion III M.D   On: 10/16/2018 20:30   [place] Ekg Site Rite  Result Date: 10/15/2018 If Site Rite image not attached, placement could not be confirmed due to current cardiac rhythm.    LOS: 28 days   Signature  Lala Lund M.D on 11/01/2018 at 10:37 AM   -  To page go to [email or web address]

## 2018-11-01 NOTE — Progress Notes (Signed)
Physical Therapy Treatment Patient Details Name: [name] MRN: [id number] DOB: 15-Oct-1945 Today's Date: 11/01/2018    History of Present Illness Pt is a 73 year old male with PMHx significant for HTN, afib on Eliquis, OSA, morbid obesity, L THA and hip infection and history of multiple recent prolonged hospitalizations.  Pt presented to ED with generalized weakness, falls, and shortness of breath- work-up in the emergency room showed A. fib heart rate 108 and a recurrent right-sided pleural effusion    PT Comments    Pt still very self limiting, dictating what will and will not be done.  Emphasized standing and squat pivot transfers.  Due to dizziness, which was addressed appropriately, pt refused to do a squat pivot without lift.  Pt was assisted to supine.     Follow Up Recommendations  SNF     Equipment Recommendations  None recommended by PT    Recommendations for Other Services       Precautions / Restrictions Precautions Precautions: Fall    Mobility  Bed Mobility Overal bed mobility: Needs Assistance Bed Mobility: Supine to Sit;Sit to Supine     Supine to sit: Max assist;Mod assist;+2 for physical assistance Sit to supine: Max assist;+2 for physical assistance   General bed mobility comments: cues for initiation, direction.  Truncal assist to come forward slow enough to give pt time to assist with UE's.  Returned to supine with more assist needed to get bil LE in bed.  Transfers Overall transfer level: Needs assistance Equipment used: Rolling walker (2 wheeled);None Transfers: Sit to/from Stand Sit to Stand: Mod assist;+2 physical assistance         General transfer comment: pt stood in the RW for >20 secs which is the best he has done in a while.  But pt became dizzy, refused to get to chair without lift.  Pt put back in the bed.  Ambulation/Gait                 Stairs             Wheelchair Mobility    Modified Rankin (Stroke  Patients Only)       Balance Overall balance assessment: Needs assistance Sitting-balance support: Feet supported;No upper extremity supported Sitting balance-[name]: Fair     Standing balance support: Bilateral upper extremity supported;During functional activity Standing balance-[name]: Poor Standing balance comment: heavy reliance on the RW or assist                            Cognition Arousal/Alertness: Awake/alert Behavior During Therapy: Anxious;Agitated Overall Cognitive Status: Within Functional Limits for tasks assessed                                 General Comments: rigid behaviors and fearful of movement      Exercises Other Exercises Other Exercises: bil LE warm up for hips/knees x5 reps    General Comments General comments (skin integrity, edema, etc.): Pt became dizzy after standing trial.  We layed back until symptoms were moderated.  pt refused to transfer to the chair without the lift.  This therapist did not oblige as the squat pivot transfer was safe.  pt returned to supine.      Pertinent Vitals/Pain Pain Assessment: Faces Faces Pain Scale: Hurts little more Pain Location:  Knee, leg pain with ROM at end ranges Pain  Descriptors / Indicators: Grimacing;Guarding Pain Intervention(s): Monitored during session;Limited activity within patient's tolerance    Home Living                      Prior Function            PT Goals (current goals can now be found in the care plan section) Acute Rehab PT Goals Patient Stated Goal: to get stronger and get home PT Goal Formulation: With patient Time For Goal Achievement: 11/06/18 Progress towards PT goals: Not progressing toward goals - comment(pt is self limiting and now is no longer willing to be pushe)    Frequency    Min 2X/week      PT Plan Current plan remains appropriate    Co-evaluation              AM-PAC PT "6 Clicks" Mobility    Outcome Measure  Help needed turning from your back to your side while in a flat bed without using bedrails?: A Lot Help needed moving from lying on your back to sitting on the side of a flat bed without using bedrails?: A Lot Help needed moving to and from a bed to a chair (including a wheelchair)?: A Lot(to 1) Help needed standing up from a chair using your arms (e.g., wheelchair or bedside chair)?: A Lot Help needed to walk in hospital room?: Total Help needed climbing 3-5 steps with a railing? : Total 6 Click Score: 10    End of Session   Activity Tolerance: Patient tolerated treatment well;Patient limited by fatigue;Patient limited by pain Patient left: in bed;with call bell/phone within reach;with bed alarm set Nurse Communication: Mobility status PT Visit Diagnosis: Other abnormalities of gait and mobility (R26.89);Muscle weakness (generalized) (M62.81);Difficulty in walking, not elsewhere classified (R26.2);History of falling (Z91.81)     Time: 5916-3846 PT Time Calculation (min) (ACUTE ONLY): 21 min  Charges:  $Therapeutic Activity: 8-22 mins                     11/01/2018  [name], PT Acute Rehabilitation Services [phone]  (pager) [phone]  (office)   [name] [name] 11/01/2018, 2:52 PM

## 2018-11-01 NOTE — Plan of Care (Signed)
  Problem: Education: Goal: Knowledge of General Education information will improve Description: Including pain rating scale, medication(s)/side effects and non-pharmacologic comfort measures Outcome: Progressing   Problem: Health Behavior/Discharge Planning: Goal: Ability to manage health-related needs will improve Outcome: Progressing   Problem: Clinical Measurements: Goal: Will remain free from infection Outcome: Progressing   

## 2018-11-02 LAB — BASIC METABOLIC PANEL
Anion gap: 7 (ref 5–15)
BUN: 29 mg/dL — ABNORMAL HIGH (ref 8–23)
CO2: 27 mmol/L (ref 22–32)
Calcium: 9.3 mg/dL (ref 8.9–10.3)
Chloride: 102 mmol/L (ref 98–111)
Creatinine, Ser: 0.92 mg/dL (ref 0.61–1.24)
GFR calc Af Amer: >60 mL/min (ref >60)
GFR calc non Af Amer: >60 mL/min (ref >60)
Glucose, Bld: 104 mg/dL — ABNORMAL HIGH (ref 70–99)
Potassium: 4.7 mmol/L (ref 3.5–5.1)
Sodium: 136 mmol/L (ref 135–145)

## 2018-11-02 LAB — CBC
HCT: 36 % — ABNORMAL LOW (ref 39.0–52.0)
Hemoglobin: 11.2 g/dL — ABNORMAL LOW (ref 13.0–17.0)
MCH: 27.9 pg (ref 26.0–34.0)
MCHC: 31.1 g/dL (ref 30.0–36.0)
MCV: 89.8 fL (ref 80.0–100.0)
Platelets: 272 10*3/uL (ref 150–400)
RBC: 4.01 MIL/uL — ABNORMAL LOW (ref 4.22–5.81)
RDW: 17.2 % — ABNORMAL HIGH (ref 11.5–15.5)
WBC: 6.3 10*3/uL (ref 4.0–10.5)
nRBC: 0 % (ref 0.0–0.2)

## 2018-11-02 LAB — GLUCOSE, CAPILLARY: Glucose-Capillary: 83 mg/dL (ref 70–99)

## 2018-11-02 LAB — MAGNESIUM: Magnesium: 1.8 mg/dL (ref 1.7–2.4)

## 2018-11-02 MED ORDER — PEG 3350-KCL-NA BICARB-NACL 420 G PO SOLR
2000.0000 mL | Freq: Once | ORAL | Status: AC
  Administered 2018-11-02: 2000 mL via ORAL
  Filled 2018-11-02: qty 4000

## 2018-11-02 MED ORDER — BISACODYL 5 MG PO TBEC
10.0000 mg | DELAYED_RELEASE_TABLET | Freq: Every day | ORAL | Status: AC
  Administered 2018-11-02 – 2018-11-04 (×3): 10 mg via ORAL
  Filled 2018-11-02 (×5): qty 2

## 2018-11-02 MED ORDER — POLYETHYLENE GLYCOL 3350 17 G PO PACK
17.0000 g | PACK | Freq: Two times a day (BID) | ORAL | Status: DC
  Filled 2018-11-02 (×3): qty 1

## 2018-11-02 NOTE — Progress Notes (Signed)
PROGRESS NOTE        PATIENT DETAILS Name: [name] Age: 73 y.o. Sex: male Date of Birth: July 20, 1946 Admit Date: 10/01/2018 Admitting Physician Vianne Bulls, MD FOY:[email or web address], Hollice Espy, MD  Brief Narrative: Patient is a 73 y.o. male with history of A. 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  Found to have a soft tissue infection of the left hip area, acute kidney injury and hyperkalemia.  ID, orthopedics consulted-underwent left hip aspiration on 2/14, infectious disease recommending 6 weeks of IV Invanz.   See below for further details  Subjective:  Patient in bed, appears comfortable, denies any headache, no fever, no chest pain or pressure, no shortness of breath , no abdominal pain. No focal weakness.  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  Was on suppressive Flagyl for Bacteroides fragilis infection-recommendations from ID are for 6 weeks of IV ertapenem-end date of November 25, 2018. Post DC follow up with primary orthopedic surgeon Dr. Ricki Rodriguez and ID physician at [facility].  AKI: Likely hemodynamically mediated-secondary to soft tissue infection of the left hip, diuretic use.  Resolved with supportive care.  Follow electrolytes periodically.  Hyperkalemia: Suspect secondary to AKI with use of Aldactone and potassium supplementation.   Resolved.  History of left total hip arthroplasty with subsequent infection with Bacteroides fragilis: Patient was evaluated by infectious disease at [facility] was previously on Flagyl.  Now on IV Invanz-Flagyl has been discontinued.  Recurrent pleural effusion: Felt to be secondary to hepatic hydrothorax-most recent CT chest on 10/07/2018 shows a small pleural effusion-patient is asymptomatic at this point.  Doubt further work-up required-furthermore he has had a recent extensive work-up including multiple thoracocentesis   Cryptogenic cirrhosis: Prior hepatitis serology done earlier this month was negative-agree with plans outlined in prior discharge summary for outpatient follow-up.  Volume status is stable-he is awake and alert-continue Aldactone  Atrial fibrillation: Rate controlled with Cardizem.Continue Eliquis.  Chronic back and right shoulder pain: Continue Lidoderm patch, Tylenol and as needed narcotics.  Bipolar disorder: Stable-continue Lexapro, Neurontin  Constipation: Placed on bowel regimen due to large amount of colonic stool noted on x-ray on 11/01/2018, will monitor, note patient frequently requests to be manually disimpacted without much stool evidence in the rectal vault.  Right shoulder pain: Claims he has severe arthritis-and has required cortisone shots in the past-spoke with Orion Crook, PA-C with orthopedics on 2/26, orthopedic service does not do inpatient cortisone shots.  Will manage with supportive care.  Shoulder exam is benign-with no signs of infection.  OSA: Continue CPAP nightly  Morbid obesity  Acute on chronic debility/deconditioning: Has chronic debility/deconditioning at baseline-this is worsened due to acute illness/AKI/left hip infection.  Per social work-very difficult to place to SNF.    DVT Prophylaxis: Full dose anticoagulation with Eliquis  Code Status: Full code  Family Communication: None at  bedside  Disposition  Plan: Remain inpatient- SNF on discharge when bed available  Antimicrobial agents: Anti-infectives (From admission, onward)   Start     Dose/Rate Route Frequency Ordered Stop   10/15/18 1500  cefTRIAXone (ROCEPHIN) 2 g in sodium chloride 0.9 % 100 mL IVPB  Status:  Discontinued     2 g 200 mL/hr over 30 Minutes Intravenous Every 24 hours 10/15/18 1434 10/15/18 1447   10/15/18 1500  ertapenem (INVANZ) 1,000 mg in sodium chloride 0.9 % 100 mL IVPB     1 g 200 mL/hr over 30 Minutes Intravenous Every 24 hours 10/15/18 1447 11/25/18 2359   10/08/18 0000  doxycycline (VIBRA-TABS) 100 MG tablet     100 mg Oral Every 12 hours 10/08/18 0921     10/06/18 1000  doxycycline (VIBRA-TABS) tablet 100 mg  Status:  Discontinued     100 mg Oral Every 12 hours 10/06/18 0726 10/11/18 1328   10/03/18 1800  metroNIDAZOLE (FLAGYL) tablet 500 mg  Status:  Discontinued     500 mg Oral Every 8 hours 10/03/18 1054 10/15/18 1447   10/03/18 0700  vancomycin (VANCOCIN) 2,000 mg in sodium chloride 0.9 % 500 mL IVPB  Status:  Discontinued     2,000 mg 250 mL/hr over 120 Minutes Intravenous Every 36 hours 10/01/18 1942 10/06/18 0726   10/02/18 0200  ceFEPIme (MAXIPIME) 2 g in sodium chloride 0.9 % 100 mL IVPB  Status:  Discontinued     2 g 200 mL/hr over 30 Minutes Intravenous Every 8 hours 10/01/18 1942 10/02/18 1008   10/01/18 1830  vancomycin (VANCOCIN) 2,500 mg in sodium chloride 0.9 % 500 mL IVPB     2,500 mg 250 mL/hr over 120 Minutes Intravenous STAT 10/01/18 1825 10/02/18 0126   10/01/18 1800  ceFEPIme (MAXIPIME) 2 g in sodium chloride 0.9 % 100 mL IVPB     2 g 200 mL/hr over 30 Minutes Intravenous  Once 10/01/18 1747 10/01/18 1903   10/01/18 1800  metroNIDAZOLE (FLAGYL) IVPB 500 mg  Status:  Discontinued     500 mg 100 mL/hr over 60 Minutes Intravenous Every 8 hours 10/01/18 1747 10/03/18 1054   10/01/18 1800  vancomycin (VANCOCIN) IVPB 1000 mg/200 mL premix  Status:  Discontinued     1,000 mg 200 mL/hr  over 60 Minutes Intravenous  Once 10/01/18 1747 10/01/18 1825      Procedures: None  CONSULTS:  None  Time spent: 15- minutes-Greater than 50% of this time was spent in counseling, explanation of diagnosis, planning of further management, and coordination of care.  MEDICATIONS: Scheduled Meds: . apixaban  5 mg Oral BID  . bisacodyl  10 mg Oral Daily  . cycloSPORINE  1 drop Both Eyes BID  . diltiazem  180 mg Oral Daily  . escitalopram  10 mg Oral Daily  . gabapentin  100 mg Oral TID  . lactulose  30 g Oral BID  . lidocaine  1 patch Transdermal Q24H  . Melatonin  6 mg Oral QHS  . midodrine  10 mg Oral TID WC  . multivitamin with minerals  1 tablet Oral Daily  . pantoprazole  40 mg Oral Daily  . [START ON 11/03/2018] polyethylene glycol  17 g Oral BID  . polyethylene glycol-electrolytes  2,000 mL Oral Once  . ENSURE MAX PROTEIN  11 oz Oral BID  . spironolactone  50 mg Oral Daily  . thiamine  100 mg Oral Daily   Continuous Infusions: . sodium chloride 250 mL (  10/24/18 0052)  . ertapenem 1,000 mg (11/01/18 1436)   PRN Meds:.sodium chloride, acetaminophen, albuterol, alum & mag hydroxide-simeth, diltiazem, hydrOXYzine, menthol-cetylpyridinium, [DISCONTINUED] ondansetron **OR** ondansetron (ZOFRAN) IV, oxyCODONE, promethazine, sodium chloride flush, traZODone, zolpidem   PHYSICAL EXAM: Vital signs: Vitals:   11/01/18 1104 11/01/18 1351 11/01/18 2152 11/02/18 0625  BP: 99/65 (!) 105/91 110/67 96/72  Pulse:  (!) 109 86 87  Resp:  20 18 18   Temp:  (!) 97.4 F (36.3 C) (!) 97.4 F (36.3 C) (!) 97.4 F (36.3 C)  TempSrc:  Oral Oral   SpO2:  99% 95% 94%  Weight:    120.6 kg  Height:       Filed Weights   10/31/18 0429 11/01/18 0700 11/02/18 0625  Weight: 119.6 kg 119.9 kg 120.6 kg   Body mass index is 40.43 kg/m.   Exam  Awake Alert, Oriented X 3, No new F.N deficits, Normal affect [place].AT,PERRAL Supple Neck,No JVD, No cervical lymphadenopathy appriciated.    Symmetrical Chest wall movement, Good air movement bilaterally, CTAB RRR,No Gallops, Rubs or new Murmurs, No Parasternal Heave +ve B.Sounds, Abd Soft, No tenderness, No organomegaly appriciated, No rebound - guarding or rigidity. No Cyanosis, chronic left hip redness, no warmth.  This is chronic.   LABORATORY DATA: CBC: Recent Labs  Lab 10/30/18 0341 11/02/18 0501  WBC 6.7 6.3  HGB 10.6* 11.2*  HCT 33.8* 36.0*  MCV 90.1 89.8  PLT 260 169    Basic Metabolic Panel: Recent Labs  Lab 10/30/18 0341 11/02/18 0501  NA 137 136  K 4.3 4.7  CL 103 102  CO2 26 27  GLUCOSE 95 104*  BUN 27* 29*  CREATININE 0.95 0.92  CALCIUM 10.0 9.3  MG  --  1.8    GFR: Estimated Creatinine Clearance: 91.7 mL/min (by C-G formula based on SCr of 0.92 mg/dL).  Liver Function Tests: No results for input(s): AST, ALT, ALKPHOS, BILITOT, PROT, ALBUMIN in the last 168 hours. No results for input(s): LIPASE, AMYLASE in the last 168 hours. No results for input(s): AMMONIA in the last 168 hours.  Coagulation Profile: No results for input(s): INR, PROTIME in the last 168 hours.  Cardiac Enzymes: No results for input(s): CKTOTAL, CKMB, CKMBINDEX, TROPONINI in the last 168 hours.  BNP (last 3 results) No results for input(s): PROBNP in the last 8760 hours.  HbA1C: No results for input(s): HGBA1C in the last 72 hours.  CBG: Recent Labs  Lab 10/29/18 0815 10/30/18 0753 10/31/18 0816 11/01/18 0804 11/02/18 0822  GLUCAP 81 85 83 86 83    Lipid Profile: No results for input(s): CHOL, HDL, LDLCALC, TRIG, CHOLHDL, LDLDIRECT in the last 72 hours.  Thyroid Function Tests: No results for input(s): TSH, T4TOTAL, FREET4, T3FREE, THYROIDAB in the last 72 hours.  Anemia Panel: No results for input(s): VITAMINB12, FOLATE, FERRITIN, TIBC, IRON, RETICCTPCT in the last 72 hours.  Urine analysis:    Component Value Date/Time   COLORURINE YELLOW 10/01/2018 1822   APPEARANCEUR CLEAR 10/01/2018 1822    APPEARANCEUR Clear 01/18/2018 1539   LABSPEC 1.010 10/01/2018 1822   PHURINE 5.0 10/01/2018 1822   GLUCOSEU NEGATIVE 10/01/2018 1822   HGBUR NEGATIVE 10/01/2018 1822   BILIRUBINUR NEGATIVE 10/01/2018 1822   BILIRUBINUR Negative 01/18/2018 Crook 10/01/2018 1822   PROTEINUR NEGATIVE 10/01/2018 1822   UROBILINOGEN 0.2 09/06/2012 0213   NITRITE NEGATIVE 10/01/2018 1822   LEUKOCYTESUR NEGATIVE 10/01/2018 1822   LEUKOCYTESUR Negative 01/18/2018 1539    Sepsis Labs: Lactic  Acid, Venous    Component Value Date/Time   LATICACIDVEN 1.7 10/01/2018 1833    MICROBIOLOGY: No results found for this or any previous visit (from the past 240 hour(s)).  RADIOLOGY STUDIES/RESULTS: Dg Knee 1-2 Views Left  Result Date: 10/16/2018 CLINICAL DATA:  Pain after fall EXAM: LEFT KNEE - 1-2 VIEW COMPARISON:  None. FINDINGS: No acute fracture or dislocation. No joint effusion. Moderate to severe degenerative changes. IMPRESSION: Degenerative changes.  No acute fracture noted. Electronically Signed   By: Dorise Bullion III M.D   On: 10/16/2018 20:32   Dg Knee 1-2 Views Right  Result Date: 10/16/2018 CLINICAL DATA:  Pain after fall EXAM: RIGHT KNEE - 1-2 VIEW COMPARISON:  None. FINDINGS: No acute fracture or effusion. Moderate to severe degenerative changes. IMPRESSION: No acute abnormality. Electronically Signed   By: Dorise Bullion III M.D   On: 10/16/2018 20:32   Ct Chest Wo Contrast  Result Date: 10/07/2018 CLINICAL DATA:  Chronic dyspnea. EXAM: CT CHEST WITHOUT CONTRAST TECHNIQUE: Multidetector CT imaging of the chest was performed following the standard protocol without IV contrast. COMPARISON:  Portable chest obtained earlier today and chest CTA dated 08/17/2018. FINDINGS: Cardiovascular: Atheromatous calcifications, including the coronary arteries and aorta. Stable borderline enlargement of the heart with mild biatrial enlargement. No pericardial effusion. Mediastinum/Nodes: No  enlarged mediastinal or axillary lymph nodes. Thyroid gland, trachea, and esophagus demonstrate no significant findings. Lungs/Pleura: Small to moderate-sized right pleural effusion. Stable left pleural thickening with calcifications. Mild bilateral atelectasis and scarring. Upper Abdomen: Post gastric bypass changes. Musculoskeletal: Mild dextroconvex thoracolumbar scoliosis. Extensive thoracic and lower cervical spine degenerative changes. IMPRESSION: 1. Small to moderate-sized right pleural effusion. 2. Mild bilateral atelectasis and scarring. 3. 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 4.  Calcific coronary artery and aortic atherosclerosis. Aortic Atherosclerosis (ICD10-I70.0). Electronically Signed   By: Claudie Revering M.D.   On: 10/07/2018 20:31   Dg Chest Port 1 View  Result Date: 10/16/2018 CLINICAL DATA:  PICC line placement. EXAM: PORTABLE CHEST 1 VIEW COMPARISON:  Radiographs and CT 10/07/2018. FINDINGS: 1223 hours. Left arm PICC tip is not optimally visualized, although appears to extend to the lower SVC level. The heart size and mediastinal contours are stable. There is aortic atherosclerosis. Bilateral pleural thickening and subpleural rounded atelectasis peripherally in the left lung are unchanged. There is no pneumothorax. Glenohumeral degenerative changes are present bilaterally. IMPRESSION: PICC line tip at the lower SVC level. No pneumothorax or acute findings. Electronically Signed   By: Richardean Sale M.D.   On: 10/16/2018 13:03   Dg Chest Port 1 View  Result Date: 10/07/2018 CLINICAL DATA:  Shortness of breath. EXAM: PORTABLE CHEST 1 VIEW COMPARISON:  10/01/2017. FINDINGS: Left lower chest incompletely imaged. Cardiomegaly with diffuse bilateral interstitial prominence and bilateral pleural effusions. Findings have progressed from prior exam are most consistent with CHF. A component of pleural scarring may be present. IMPRESSION:  Congestive heart failure with bilateral interstitial edema. Bibasilar pneumonia can not be excluded. Bilateral pleural effusions. A component of pleural scarring may be present. Electronically Signed   By: Marcello Moores  Register   On: 10/07/2018 08:58   Dg Abd Portable 1v  Result Date: 11/01/2018 CLINICAL DATA:  Constipation EXAM: PORTABLE ABDOMEN - 1 VIEW COMPARISON:  CT 01/09/2015 FINDINGS: Large stool burden throughout the colon. No evidence of bowel obstruction or free air. Prior cholecystectomy. Surgical sutures in the left upper abdomen. IMPRESSION: Large stool burden.  No bowel obstruction or free air. Electronically  Signed   By: Rolm Baptise M.D.   On: 11/01/2018 11:03   Dg Foot 2 Views Left  Result Date: 10/17/2018 CLINICAL DATA:  Pain in left foot. EXAM: LEFT FOOT - 2 VIEW COMPARISON:  None. FINDINGS: Osteopenia. No fractures in the toes or metacarpals. Enthesopathic change at the posterior calcaneus. There is a lucency along the inferior aspect of the calcaneus which does not appear to extend through the calcaneus and is relatively well corticated. No other bony abnormalities. IMPRESSION: 1. There is a lucency along the inferior aspect of the calcaneus which is relatively well corticated and does not appear to extend through the calcaneus. This is favored to be nonacute. Recommend clinical correlation to exclude pain in this region. Electronically Signed   By: Dorise Bullion III M.D   On: 10/17/2018 15:05   Dg Fluoro Guided Needle Plc Aspiration/injection Loc  Result Date: 10/15/2018 CLINICAL DATA:  Left hip pain, evaluate for fluid collection EXAM: LEFT HIP ASPIRATION UNDER FLUOROSCOPY FLUOROSCOPY TIME:  Fluoroscopy Time:  18 seconds Radiation Exposure Index (if provided by the fluoroscopic device): 2.3 mGy Number of Acquired Spot Images: 1 PROCEDURE: Overlying skin prepped with Betadine, draped in the usual sterile fashion, and infiltrated locally with buffered Lidocaine. 20 gauge spinal needle  advanced to the prosthetic junction of the femoral head/neck. 1 ml of Lidocaine injected easily. No fluid/frank infection could be aspirated. 4 mL Lidocaine and 6 mL sterile saline administered into the joint with attempted subsequent aspiration. Less than 1 mL fluid was aspirated and sent for laboratory evaluation. No immediate complication. IMPRESSION: Technically successful left hip aspiration under fluoroscopy, as described above. Electronically Signed   By: Julian Hy M.D.   On: 10/15/2018 11:18   Dg Hips Bilat With Pelvis 2v  Result Date: 10/16/2018 CLINICAL DATA:  Fall.  Pain. EXAM: DG HIP (WITH OR WITHOUT PELVIS) 2V BILAT COMPARISON:  None. FINDINGS: The patient is status post bilateral hip replacements. Visualized hardware is in good position. No acute fractures. IMPRESSION: Bilateral hip replacements.  No acute fractures. Electronically Signed   By: Dorise Bullion III M.D   On: 10/16/2018 20:30   [place] Ekg Site Rite  Result Date: 10/15/2018 If Site Rite image not attached, placement could not be confirmed due to current cardiac rhythm.    LOS: 29 days   Signature  Lala Lund M.D on 11/02/2018 at 10:57 AM   -  To page go to [email or web address]

## 2018-11-02 NOTE — Progress Notes (Signed)
Patient remains on [facility] to Place SNF waitlist.   Cedric Fishman LCSW [phone]

## 2018-11-02 NOTE — Progress Notes (Signed)
  Patient has home CPAP. No assistance needed from RT.  Patient understands to call for Respiratory if needed.

## 2018-11-03 ENCOUNTER — Inpatient Hospital Stay (HOSPITAL_COMMUNITY): Payer: Medicare Other

## 2018-11-03 NOTE — Progress Notes (Signed)
PROGRESS NOTE        PATIENT DETAILS Name: [name] Age: 73 y.o. Sex: male Date of Birth: 1946-07-08 Admit Date: 10/01/2018 Admitting Physician Vianne Bulls, MD EHU:[id number], Hollice Espy, MD  Brief Narrative: Patient is a 73 y.o. male with history of A. 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  Found to have a soft tissue infection of the left hip area, acute kidney injury and hyperkalemia.  ID, orthopedics consulted-underwent left hip aspiration on 2/14, infectious disease recommending 6 weeks of IV Invanz.   See below for further details  Subjective:  Patient in bed, appears comfortable, denies any headache, no fever, no chest pain or pressure, no shortness of breath , no abdominal pain. No focal weakness.   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  Was on suppressive Flagyl for Bacteroides fragilis infection-recommendations from ID are for 6 weeks of IV ertapenem-end date of November 25, 2018. Post DC follow up with primary orthopedic surgeon Dr. Ricki Rodriguez and ID physician at [facility].  AKI: Likely hemodynamically mediated-secondary to soft tissue infection of the left hip, diuretic use.  Resolved with supportive care.  Follow electrolytes periodically.  Hyperkalemia: Suspect secondary to AKI with use of Aldactone and potassium supplementation.   Resolved.  History of left total hip arthroplasty with subsequent infection with Bacteroides fragilis: Patient was evaluated by infectious disease at [facility] was previously on Flagyl.  Now on IV Invanz-Flagyl has been discontinued.  Recurrent pleural effusion: Felt to be secondary to hepatic hydrothorax-most recent CT chest on 10/07/2018 shows a small pleural effusion-patient is asymptomatic at this point.  Doubt further work-up required-furthermore he has had a recent extensive work-up including multiple thoracocentesis   Cryptogenic cirrhosis: Prior hepatitis serology done earlier this month was negative-agree with plans outlined in prior discharge summary for outpatient follow-up.  Volume status is stable-he is awake and alert-continue Aldactone  Atrial fibrillation: Rate controlled with Cardizem.Continue Eliquis.  Chronic back and right shoulder pain: Continue Lidoderm patch, Tylenol and as needed narcotics.  Bipolar disorder: Stable-continue Lexapro, Neurontin  Constipation: Large stool burden noted on x-ray on 11/02/2018, much improved after 2 L of GoLYTELY, repeat x-ray to evaluate stool burden, he clinically feels much better, note patient frequently requests to be manually disimpacted without much stool evidence in the rectal vault.  Right shoulder pain: Claims he has severe arthritis-and has required cortisone shots in the past-spoke with Orion Crook, PA-C with orthopedics on 2/26, orthopedic service does not do inpatient cortisone shots.  Will manage with supportive care.  Shoulder exam is benign-with no signs of infection.  OSA: Continue CPAP nightly  Morbid obesity  Acute on chronic debility/deconditioning: Has chronic debility/deconditioning at baseline-this is worsened due to acute illness/AKI/left hip infection.  Per social work-very difficult to place to SNF.    DVT Prophylaxis: Full dose anticoagulation with Eliquis  Code Status: Full  code  Family Communication: None at bedside  Disposition Plan: Remain inpatient- SNF on discharge when bed available  Antimicrobial agents: Anti-infectives (From admission, onward)   Start     Dose/Rate Route Frequency Ordered Stop   10/15/18 1500  cefTRIAXone (ROCEPHIN) 2 g in sodium chloride 0.9 % 100 mL IVPB  Status:  Discontinued     2 g 200 mL/hr over 30 Minutes Intravenous Every 24 hours 10/15/18 1434 10/15/18 1447   10/15/18 1500  ertapenem (INVANZ) 1,000 mg in sodium chloride 0.9 % 100 mL IVPB     1 g 200 mL/hr over 30 Minutes Intravenous Every 24 hours 10/15/18 1447 11/25/18 2359   10/08/18 0000  doxycycline (VIBRA-TABS) 100 MG tablet     100 mg Oral Every 12 hours 10/08/18 0921     10/06/18 1000  doxycycline (VIBRA-TABS) tablet 100 mg  Status:  Discontinued     100 mg Oral Every 12 hours 10/06/18 0726 10/11/18 1328   10/03/18 1800  metroNIDAZOLE (FLAGYL) tablet 500 mg  Status:  Discontinued     500 mg Oral Every 8 hours 10/03/18 1054 10/15/18 1447   10/03/18 0700  vancomycin (VANCOCIN) 2,000 mg in sodium chloride 0.9 % 500 mL IVPB  Status:  Discontinued     2,000 mg 250 mL/hr over 120 Minutes Intravenous Every 36 hours 10/01/18 1942 10/06/18 0726   10/02/18 0200  ceFEPIme (MAXIPIME) 2 g in sodium chloride 0.9 % 100 mL IVPB  Status:  Discontinued     2 g 200 mL/hr over 30 Minutes Intravenous Every 8 hours 10/01/18 1942 10/02/18 1008   10/01/18 1830  vancomycin (VANCOCIN) 2,500 mg in sodium chloride 0.9 % 500 mL IVPB     2,500 mg 250 mL/hr over 120 Minutes Intravenous STAT 10/01/18 1825 10/02/18 0126   10/01/18 1800  ceFEPIme (MAXIPIME) 2 g in sodium chloride 0.9 % 100 mL IVPB     2 g 200 mL/hr over 30 Minutes Intravenous  Once 10/01/18 1747 10/01/18 1903   10/01/18 1800  metroNIDAZOLE (FLAGYL) IVPB 500 mg  Status:  Discontinued     500 mg 100 mL/hr over 60 Minutes Intravenous Every 8 hours 10/01/18 1747 10/03/18 1054   10/01/18 1800  vancomycin (VANCOCIN) IVPB 1000  mg/200 mL premix  Status:  Discontinued     1,000 mg 200 mL/hr over 60 Minutes Intravenous  Once 10/01/18 1747 10/01/18 1825      Procedures: None  CONSULTS:  None  Time spent: 15- minutes-Greater than 50% of this time was spent in counseling, explanation of diagnosis, planning of further management, and coordination of care.  MEDICATIONS: Scheduled Meds: . apixaban  5 mg Oral BID  . bisacodyl  10 mg Oral Daily  . cycloSPORINE  1 drop Both Eyes BID  . diltiazem  180 mg Oral Daily  . escitalopram  10 mg Oral Daily  . gabapentin  100 mg Oral TID  . lactulose  30 g Oral BID  . lidocaine  1 patch Transdermal Q24H  . Melatonin  6 mg Oral QHS  . midodrine  10 mg Oral TID WC  . multivitamin with minerals  1 tablet Oral Daily  . pantoprazole  40 mg Oral Daily  . polyethylene glycol  17 g Oral BID  . ENSURE MAX PROTEIN  11 oz Oral BID  . spironolactone  50 mg Oral Daily  . thiamine  100 mg Oral Daily   Continuous Infusions: . sodium chloride 250 mL (10/24/18 0052)  .  ertapenem 1,000 mg (11/02/18 1500)   PRN Meds:.sodium chloride, acetaminophen, albuterol, alum & mag hydroxide-simeth, diltiazem, hydrOXYzine, menthol-cetylpyridinium, [DISCONTINUED] ondansetron **OR** ondansetron (ZOFRAN) IV, oxyCODONE, promethazine, sodium chloride flush, traZODone, zolpidem   PHYSICAL EXAM: Vital signs: Vitals:   11/02/18 1221 11/02/18 1454 11/02/18 2122 11/03/18 0456  BP: 99/69 95/63 101/66 103/68  Pulse:  84 92 (!) 106  Resp:  18 18 18   Temp:  98.2 F (36.8 C) 98 F (36.7 C) 97.8 F (36.6 C)  TempSrc:   Oral   SpO2:  (!) 89% 99% 92%  Weight:      Height:       Filed Weights   10/31/18 0429 11/01/18 0700 11/02/18 0625  Weight: 119.6 kg 119.9 kg 120.6 kg   Body mass index is 40.43 kg/m.   Exam  Awake Alert, Oriented X 3, No new F.N deficits, Normal affect [place].AT,PERRAL Supple Neck,No JVD, No cervical lymphadenopathy appriciated.  Symmetrical Chest wall movement, Good air  movement bilaterally, CTAB RRR,No Gallops, Rubs or new Murmurs, No Parasternal Heave +ve B.Sounds, Abd Soft, No tenderness, No organomegaly appriciated, No rebound - guarding or rigidity. No Cyanosis, Clubbing or edema, chronic left hip redness, no warmth.  This is chronic.   LABORATORY DATA: CBC: Recent Labs  Lab 10/30/18 0341 11/02/18 0501  WBC 6.7 6.3  HGB 10.6* 11.2*  HCT 33.8* 36.0*  MCV 90.1 89.8  PLT 260 400    Basic Metabolic Panel: Recent Labs  Lab 10/30/18 0341 11/02/18 0501  NA 137 136  K 4.3 4.7  CL 103 102  CO2 26 27  GLUCOSE 95 104*  BUN 27* 29*  CREATININE 0.95 0.92  CALCIUM 10.0 9.3  MG  --  1.8    GFR: Estimated Creatinine Clearance: 91.7 mL/min (by C-G formula based on SCr of 0.92 mg/dL).  Liver Function Tests: No results for input(s): AST, ALT, ALKPHOS, BILITOT, PROT, ALBUMIN in the last 168 hours. No results for input(s): LIPASE, AMYLASE in the last 168 hours. No results for input(s): AMMONIA in the last 168 hours.  Coagulation Profile: No results for input(s): INR, PROTIME in the last 168 hours.  Cardiac Enzymes: No results for input(s): CKTOTAL, CKMB, CKMBINDEX, TROPONINI in the last 168 hours.  BNP (last 3 results) No results for input(s): PROBNP in the last 8760 hours.  HbA1C: No results for input(s): HGBA1C in the last 72 hours.  CBG: Recent Labs  Lab 10/29/18 0815 10/30/18 0753 10/31/18 0816 11/01/18 0804 11/02/18 0822  GLUCAP 81 85 83 86 83    Lipid Profile: No results for input(s): CHOL, HDL, LDLCALC, TRIG, CHOLHDL, LDLDIRECT in the last 72 hours.  Thyroid Function Tests: No results for input(s): TSH, T4TOTAL, FREET4, T3FREE, THYROIDAB in the last 72 hours.  Anemia Panel: No results for input(s): VITAMINB12, FOLATE, FERRITIN, TIBC, IRON, RETICCTPCT in the last 72 hours.  Urine analysis:    Component Value Date/Time   COLORURINE YELLOW 10/01/2018 1822   APPEARANCEUR CLEAR 10/01/2018 1822   APPEARANCEUR Clear  01/18/2018 1539   LABSPEC 1.010 10/01/2018 1822   PHURINE 5.0 10/01/2018 1822   GLUCOSEU NEGATIVE 10/01/2018 1822   HGBUR NEGATIVE 10/01/2018 1822   BILIRUBINUR NEGATIVE 10/01/2018 1822   BILIRUBINUR Negative 01/18/2018 1539   KETONESUR NEGATIVE 10/01/2018 1822   PROTEINUR NEGATIVE 10/01/2018 1822   UROBILINOGEN 0.2 09/06/2012 0213   NITRITE NEGATIVE 10/01/2018 1822   LEUKOCYTESUR NEGATIVE 10/01/2018 1822   LEUKOCYTESUR Negative 01/18/2018 1539    Sepsis Labs: Lactic Acid, Venous    Component  Value Date/Time   LATICACIDVEN 1.7 10/01/2018 1833    MICROBIOLOGY: No results found for this or any previous visit (from the past 240 hour(s)).  RADIOLOGY STUDIES/RESULTS: Dg Knee 1-2 Views Left  Result Date: 10/16/2018 CLINICAL DATA:  Pain after fall EXAM: LEFT KNEE - 1-2 VIEW COMPARISON:  None. FINDINGS: No acute fracture or dislocation. No joint effusion. Moderate to severe degenerative changes. IMPRESSION: Degenerative changes.  No acute fracture noted. Electronically Signed   By: Dorise Bullion III M.D   On: 10/16/2018 20:32   Dg Knee 1-2 Views Right  Result Date: 10/16/2018 CLINICAL DATA:  Pain after fall EXAM: RIGHT KNEE - 1-2 VIEW COMPARISON:  None. FINDINGS: No acute fracture or effusion. Moderate to severe degenerative changes. IMPRESSION: No acute abnormality. Electronically Signed   By: Dorise Bullion III M.D   On: 10/16/2018 20:32   Ct Chest Wo Contrast  Result Date: 10/07/2018 CLINICAL DATA:  Chronic dyspnea. EXAM: CT CHEST WITHOUT CONTRAST TECHNIQUE: Multidetector CT imaging of the chest was performed following the standard protocol without IV contrast. COMPARISON:  Portable chest obtained earlier today and chest CTA dated 08/17/2018. FINDINGS: Cardiovascular: Atheromatous calcifications, including the coronary arteries and aorta. Stable borderline enlargement of the heart with mild biatrial enlargement. No pericardial effusion. Mediastinum/Nodes: No enlarged mediastinal or  axillary lymph nodes. Thyroid gland, trachea, and esophagus demonstrate no significant findings. Lungs/Pleura: Small to moderate-sized right pleural effusion. Stable left pleural thickening with calcifications. Mild bilateral atelectasis and scarring. Upper Abdomen: Post gastric bypass changes. Musculoskeletal: Mild dextroconvex thoracolumbar scoliosis. Extensive thoracic and lower cervical spine degenerative changes. IMPRESSION: 1. Small to moderate-sized right pleural effusion. 2. Mild bilateral atelectasis and scarring. 3. 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 4.  Calcific coronary artery and aortic atherosclerosis. Aortic Atherosclerosis (ICD10-I70.0). Electronically Signed   By: Claudie Revering M.D.   On: 10/07/2018 20:31   Dg Chest Port 1 View  Result Date: 10/16/2018 CLINICAL DATA:  PICC line placement. EXAM: PORTABLE CHEST 1 VIEW COMPARISON:  Radiographs and CT 10/07/2018. FINDINGS: 1223 hours. Left arm PICC tip is not optimally visualized, although appears to extend to the lower SVC level. The heart size and mediastinal contours are stable. There is aortic atherosclerosis. Bilateral pleural thickening and subpleural rounded atelectasis peripherally in the left lung are unchanged. There is no pneumothorax. Glenohumeral degenerative changes are present bilaterally. IMPRESSION: PICC line tip at the lower SVC level. No pneumothorax or acute findings. Electronically Signed   By: Richardean Sale M.D.   On: 10/16/2018 13:03   Dg Chest Port 1 View  Result Date: 10/07/2018 CLINICAL DATA:  Shortness of breath. EXAM: PORTABLE CHEST 1 VIEW COMPARISON:  10/01/2017. FINDINGS: Left lower chest incompletely imaged. Cardiomegaly with diffuse bilateral interstitial prominence and bilateral pleural effusions. Findings have progressed from prior exam are most consistent with CHF. A component of pleural scarring may be present. IMPRESSION: Congestive heart failure  with bilateral interstitial edema. Bibasilar pneumonia can not be excluded. Bilateral pleural effusions. A component of pleural scarring may be present. Electronically Signed   By: Marcello Moores  Register   On: 10/07/2018 08:58   Dg Abd Portable 1v  Result Date: 11/01/2018 CLINICAL DATA:  Constipation EXAM: PORTABLE ABDOMEN - 1 VIEW COMPARISON:  CT 01/09/2015 FINDINGS: Large stool burden throughout the colon. No evidence of bowel obstruction or free air. Prior cholecystectomy. Surgical sutures in the left upper abdomen. IMPRESSION: Large stool burden.  No bowel obstruction or free air. Electronically Signed   By: Lennette Bihari  [place] M.D.   On: 11/01/2018 11:03   Dg Foot 2 Views Left  Result Date: 10/17/2018 CLINICAL DATA:  Pain in left foot. EXAM: LEFT FOOT - 2 VIEW COMPARISON:  None. FINDINGS: Osteopenia. No fractures in the toes or metacarpals. Enthesopathic change at the posterior calcaneus. There is a lucency along the inferior aspect of the calcaneus which does not appear to extend through the calcaneus and is relatively well corticated. No other bony abnormalities. IMPRESSION: 1. There is a lucency along the inferior aspect of the calcaneus which is relatively well corticated and does not appear to extend through the calcaneus. This is favored to be nonacute. Recommend clinical correlation to exclude pain in this region. Electronically Signed   By: Dorise Bullion III M.D   On: 10/17/2018 15:05   Dg Fluoro Guided Needle Plc Aspiration/injection Loc  Result Date: 10/15/2018 CLINICAL DATA:  Left hip pain, evaluate for fluid collection EXAM: LEFT HIP ASPIRATION UNDER FLUOROSCOPY FLUOROSCOPY TIME:  Fluoroscopy Time:  18 seconds Radiation Exposure Index (if provided by the fluoroscopic device): 2.3 mGy Number of Acquired Spot Images: 1 PROCEDURE: Overlying skin prepped with Betadine, draped in the usual sterile fashion, and infiltrated locally with buffered Lidocaine. 20 gauge spinal needle advanced to the prosthetic  junction of the femoral head/neck. 1 ml of Lidocaine injected easily. No fluid/frank infection could be aspirated. 4 mL Lidocaine and 6 mL sterile saline administered into the joint with attempted subsequent aspiration. Less than 1 mL fluid was aspirated and sent for laboratory evaluation. No immediate complication. IMPRESSION: Technically successful left hip aspiration under fluoroscopy, as described above. Electronically Signed   By: Julian Hy M.D.   On: 10/15/2018 11:18   Dg Hips Bilat With Pelvis 2v  Result Date: 10/16/2018 CLINICAL DATA:  Fall.  Pain. EXAM: DG HIP (WITH OR WITHOUT PELVIS) 2V BILAT COMPARISON:  None. FINDINGS: The patient is status post bilateral hip replacements. Visualized hardware is in good position. No acute fractures. IMPRESSION: Bilateral hip replacements.  No acute fractures. Electronically Signed   By: Dorise Bullion III M.D   On: 10/16/2018 20:30   [place] Ekg Site Rite  Result Date: 10/15/2018 If Site Rite image not attached, placement could not be confirmed due to current cardiac rhythm.    LOS: 30 days   Signature  Lala Lund M.D on 11/03/2018 at 10:50 AM   -  To page go to [email or web address]

## 2018-11-04 NOTE — Progress Notes (Signed)
Patient remains on Difficult to Place waitlist.   Cedric Fishman LCSW [phone]

## 2018-11-04 NOTE — Progress Notes (Signed)
PROGRESS NOTE        PATIENT DETAILS Name: [name] Age: 73 y.o. Sex: male Date of Birth: [birth year] Admit Date: 10/01/2018 Admitting Physician Vianne Bulls, MD IOE:[id number], Hollice Espy, MD  Brief Narrative: Patient is a 73 y.o. male with history of A. 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  Found to have a soft tissue infection of the left hip area, acute kidney injury and hyperkalemia.  ID, orthopedics consulted-underwent left hip aspiration on 2/14, infectious disease recommending 6 weeks of IV Invanz.   See below for further details  Subjective:  Patient in bed, appears comfortable, denies any headache, no fever, no chest pain or pressure, no shortness of breath , no abdominal pain. No focal weakness.  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  Was on suppressive Flagyl for Bacteroides fragilis infection-recommendations from ID are for 6 weeks of IV ertapenem-end date of November 25, 2018. Post DC follow up with primary orthopedic surgeon Dr. Ricki Rodriguez and ID physician at [facility].  AKI: Likely hemodynamically mediated-secondary to soft tissue infection of the left hip, diuretic use.  Resolved with supportive care.  Follow electrolytes periodically.  Hyperkalemia: Suspect secondary to AKI with use of Aldactone and potassium supplementation.   Resolved.  History of left total hip arthroplasty with subsequent infection with Bacteroides fragilis: Patient was evaluated by infectious disease at [facility] was previously on Flagyl.  Now on IV Invanz-Flagyl has been discontinued.  Recurrent pleural effusion: Felt to be secondary to hepatic hydrothorax-most recent CT chest on 10/07/2018 shows a small pleural effusion-patient is asymptomatic at this point.  Doubt further work-up required-furthermore he has had a recent extensive work-up including multiple thoracocentesis   Cryptogenic cirrhosis: Prior hepatitis serology done earlier this month was negative-agree with plans outlined in prior discharge summary for outpatient follow-up.  Volume status is stable-he is awake and alert-continue Aldactone  Atrial fibrillation: Rate controlled with Cardizem.Continue Eliquis.  Chronic back and right shoulder pain: Continue Lidoderm patch, Tylenol and as needed narcotics.  Bipolar disorder: Stable-continue Lexapro, Neurontin  Constipation: Large stool burden noted on x-ray on 11/02/2018, much improved after 2 L of GoLYTELY, repeat x-ray to evaluate stool burden, he clinically feels much better, note patient frequently requests to be manually disimpacted without much stool evidence in the rectal vault.  Right shoulder pain: Claims he has severe arthritis-and has required cortisone shots in the past-spoke with Orion Crook, PA-C with orthopedics on 2/26, orthopedic service does not do inpatient cortisone shots.  Will manage with supportive care.  Shoulder exam is benign-with no signs of infection.  OSA: Continue CPAP nightly  Morbid obesity  Acute on chronic debility/deconditioning: Has chronic debility/deconditioning at baseline-this is worsened due to acute illness/AKI/left hip infection.  Per social work-very difficult to place to SNF.    DVT Prophylaxis: Full dose anticoagulation with Eliquis  Code  Status: Full  code  Family Communication: None at bedside  Disposition Plan: Remain inpatient- SNF on discharge when bed available  Antimicrobial agents: Anti-infectives (From admission, onward)   Start     Dose/Rate Route Frequency Ordered Stop   10/15/18 1500  cefTRIAXone (ROCEPHIN) 2 g in sodium chloride 0.9 % 100 mL IVPB  Status:  Discontinued     2 g 200 mL/hr over 30 Minutes Intravenous Every 24 hours 10/15/18 1434 10/15/18 1447   10/15/18 1500  ertapenem (INVANZ) 1,000 mg in sodium chloride 0.9 % 100 mL IVPB     1 g 200 mL/hr over 30 Minutes Intravenous Every 24 hours 10/15/18 1447 11/25/18 2359   10/08/18 0000  doxycycline (VIBRA-TABS) 100 MG tablet     100 mg Oral Every 12 hours 10/08/18 0921     10/06/18 1000  doxycycline (VIBRA-TABS) tablet 100 mg  Status:  Discontinued     100 mg Oral Every 12 hours 10/06/18 0726 10/11/18 1328   10/03/18 1800  metroNIDAZOLE (FLAGYL) tablet 500 mg  Status:  Discontinued     500 mg Oral Every 8 hours 10/03/18 1054 10/15/18 1447   10/03/18 0700  vancomycin (VANCOCIN) 2,000 mg in sodium chloride 0.9 % 500 mL IVPB  Status:  Discontinued     2,000 mg 250 mL/hr over 120 Minutes Intravenous Every 36 hours 10/01/18 1942 10/06/18 0726   10/02/18 0200  ceFEPIme (MAXIPIME) 2 g in sodium chloride 0.9 % 100 mL IVPB  Status:  Discontinued     2 g 200 mL/hr over 30 Minutes Intravenous Every 8 hours 10/01/18 1942 10/02/18 1008   10/01/18 1830  vancomycin (VANCOCIN) 2,500 mg in sodium chloride 0.9 % 500 mL IVPB     2,500 mg 250 mL/hr over 120 Minutes Intravenous STAT 10/01/18 1825 10/02/18 0126   10/01/18 1800  ceFEPIme (MAXIPIME) 2 g in sodium chloride 0.9 % 100 mL IVPB     2 g 200 mL/hr over 30 Minutes Intravenous  Once 10/01/18 1747 10/01/18 1903   10/01/18 1800  metroNIDAZOLE (FLAGYL) IVPB 500 mg  Status:  Discontinued     500 mg 100 mL/hr over 60 Minutes Intravenous Every 8 hours 10/01/18 1747 10/03/18 1054   10/01/18 1800  vancomycin (VANCOCIN) IVPB 1000  mg/200 mL premix  Status:  Discontinued     1,000 mg 200 mL/hr over 60 Minutes Intravenous  Once 10/01/18 1747 10/01/18 1825      Procedures: None  CONSULTS:  None  Time spent: 15- minutes-Greater than 50% of this time was spent in counseling, explanation of diagnosis, planning of further management, and coordination of care.  MEDICATIONS: Scheduled Meds: . apixaban  5 mg Oral BID  . bisacodyl  10 mg Oral Daily  . cycloSPORINE  1 drop Both Eyes BID  . diltiazem  180 mg Oral Daily  . escitalopram  10 mg Oral Daily  . gabapentin  100 mg Oral TID  . lactulose  30 g Oral BID  . lidocaine  1 patch Transdermal Q24H  . Melatonin  6 mg Oral QHS  . midodrine  10 mg Oral TID WC  . multivitamin with minerals  1 tablet Oral Daily  . pantoprazole  40 mg Oral Daily  . polyethylene glycol  17 g Oral BID  . ENSURE MAX PROTEIN  11 oz Oral BID  . spironolactone  50 mg Oral Daily  . thiamine  100 mg Oral Daily   Continuous Infusions: . sodium chloride 250 mL (10/24/18 0052)  .  ertapenem 1,000 mg (11/03/18 1557)   PRN Meds:.sodium chloride, acetaminophen, albuterol, alum & mag hydroxide-simeth, diltiazem, hydrOXYzine, menthol-cetylpyridinium, [DISCONTINUED] ondansetron **OR** ondansetron (ZOFRAN) IV, oxyCODONE, promethazine, sodium chloride flush, traZODone, zolpidem   PHYSICAL EXAM: Vital signs: Vitals:   11/03/18 1420 11/03/18 2154 11/04/18 0453 11/04/18 0455  BP: 93/63 (!) 93/56 102/77   Pulse: 91 88 78   Resp: 18 15 15    Temp: 98.7 F (37.1 C) 97.7 F (36.5 C) 97.8 F (36.6 C)   TempSrc: Oral Oral Oral   SpO2: 96% 100% 98%   Weight:    122 kg  Height:       Filed Weights   11/01/18 0700 11/02/18 0625 11/04/18 0455  Weight: 119.9 kg 120.6 kg 122 kg   Body mass index is 40.9 kg/m.   Exam  Awake Alert, Oriented X 3, No new F.N deficits, Normal affect [place].AT,PERRAL Supple Neck,No JVD, No cervical lymphadenopathy appriciated.  Symmetrical Chest wall movement, Good air  movement bilaterally, CTAB RRR,No Gallops, Rubs or new Murmurs, No Parasternal Heave +ve B.Sounds, Abd Soft, No tenderness, No organomegaly appriciated, No rebound - guarding or rigidity. No Cyanosis, Clubbing or edema, chronic left hip redness, no warmth.  This is chronic.   LABORATORY DATA: CBC: Recent Labs  Lab 10/30/18 0341 11/02/18 0501  WBC 6.7 6.3  HGB 10.6* 11.2*  HCT 33.8* 36.0*  MCV 90.1 89.8  PLT 260 161    Basic Metabolic Panel: Recent Labs  Lab 10/30/18 0341 11/02/18 0501  NA 137 136  K 4.3 4.7  CL 103 102  CO2 26 27  GLUCOSE 95 104*  BUN 27* 29*  CREATININE 0.95 0.92  CALCIUM 10.0 9.3  MG  --  1.8    GFR: Estimated Creatinine Clearance: 92.2 mL/min (by C-G formula based on SCr of 0.92 mg/dL).  Liver Function Tests: No results for input(s): AST, ALT, ALKPHOS, BILITOT, PROT, ALBUMIN in the last 168 hours. No results for input(s): LIPASE, AMYLASE in the last 168 hours. No results for input(s): AMMONIA in the last 168 hours.  Coagulation Profile: No results for input(s): INR, PROTIME in the last 168 hours.  Cardiac Enzymes: No results for input(s): CKTOTAL, CKMB, CKMBINDEX, TROPONINI in the last 168 hours.  BNP (last 3 results) No results for input(s): PROBNP in the last 8760 hours.  HbA1C: No results for input(s): HGBA1C in the last 72 hours.  CBG: Recent Labs  Lab 10/29/18 0815 10/30/18 0753 10/31/18 0816 11/01/18 0804 11/02/18 0822  GLUCAP 81 85 83 86 83    Lipid Profile: No results for input(s): CHOL, HDL, LDLCALC, TRIG, CHOLHDL, LDLDIRECT in the last 72 hours.  Thyroid Function Tests: No results for input(s): TSH, T4TOTAL, FREET4, T3FREE, THYROIDAB in the last 72 hours.  Anemia Panel: No results for input(s): VITAMINB12, FOLATE, FERRITIN, TIBC, IRON, RETICCTPCT in the last 72 hours.  Urine analysis:    Component Value Date/Time   COLORURINE YELLOW 10/01/2018 1822   APPEARANCEUR CLEAR 10/01/2018 1822   APPEARANCEUR Clear  01/18/2018 1539   LABSPEC 1.010 10/01/2018 1822   PHURINE 5.0 10/01/2018 1822   GLUCOSEU NEGATIVE 10/01/2018 1822   HGBUR NEGATIVE 10/01/2018 1822   BILIRUBINUR NEGATIVE 10/01/2018 1822   BILIRUBINUR Negative 01/18/2018 1539   KETONESUR NEGATIVE 10/01/2018 1822   PROTEINUR NEGATIVE 10/01/2018 1822   UROBILINOGEN 0.2 09/06/2012 0213   NITRITE NEGATIVE 10/01/2018 1822   LEUKOCYTESUR NEGATIVE 10/01/2018 1822   LEUKOCYTESUR Negative 01/18/2018 1539    Sepsis Labs: Lactic Acid, Venous    Component  Value Date/Time   LATICACIDVEN 1.7 10/01/2018 1833    MICROBIOLOGY:  No results found for this or any previous visit (from the past 240 hour(s)).  RADIOLOGY STUDIES/RESULTS:  Dg Knee 1-2 Views Left  Result Date: 10/16/2018 CLINICAL DATA:  Pain after fall EXAM: LEFT KNEE - 1-2 VIEW COMPARISON:  None. FINDINGS: No acute fracture or dislocation. No joint effusion. Moderate to severe degenerative changes. IMPRESSION: Degenerative changes.  No acute fracture noted. Electronically Signed   By: Dorise Bullion III M.D   On: 10/16/2018 20:32   Dg Knee 1-2 Views Right  Result Date: 10/16/2018 CLINICAL DATA:  Pain after fall EXAM: RIGHT KNEE - 1-2 VIEW COMPARISON:  None. FINDINGS: No acute fracture or effusion. Moderate to severe degenerative changes. IMPRESSION: No acute abnormality. Electronically Signed   By: Dorise Bullion III M.D   On: 10/16/2018 20:32   Ct Chest Wo Contrast  Result Date: 10/07/2018 CLINICAL DATA:  Chronic dyspnea. EXAM: CT CHEST WITHOUT CONTRAST TECHNIQUE: Multidetector CT imaging of the chest was performed following the standard protocol without IV contrast. COMPARISON:  Portable chest obtained earlier today and chest CTA dated 08/17/2018. FINDINGS: Cardiovascular: Atheromatous calcifications, including the coronary arteries and aorta. Stable borderline enlargement of the heart with mild biatrial enlargement. No pericardial effusion. Mediastinum/Nodes: No enlarged mediastinal  or axillary lymph nodes. Thyroid gland, trachea, and esophagus demonstrate no significant findings. Lungs/Pleura: Small to moderate-sized right pleural effusion. Stable left pleural thickening with calcifications. Mild bilateral atelectasis and scarring. Upper Abdomen: Post gastric bypass changes. Musculoskeletal: Mild dextroconvex thoracolumbar scoliosis. Extensive thoracic and lower cervical spine degenerative changes. IMPRESSION: 1. Small to moderate-sized right pleural effusion. 2. Mild bilateral atelectasis and scarring. 3. 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 4.  Calcific coronary artery and aortic atherosclerosis. Aortic Atherosclerosis (ICD10-I70.0). Electronically Signed   By: Claudie Revering M.D.   On: 10/07/2018 20:31   Dg Chest Port 1 View  Result Date: 10/16/2018 CLINICAL DATA:  PICC line placement. EXAM: PORTABLE CHEST 1 VIEW COMPARISON:  Radiographs and CT 10/07/2018. FINDINGS: 1223 hours. Left arm PICC tip is not optimally visualized, although appears to extend to the lower SVC level. The heart size and mediastinal contours are stable. There is aortic atherosclerosis. Bilateral pleural thickening and subpleural rounded atelectasis peripherally in the left lung are unchanged. There is no pneumothorax. Glenohumeral degenerative changes are present bilaterally. IMPRESSION: PICC line tip at the lower SVC level. No pneumothorax or acute findings. Electronically Signed   By: Richardean Sale M.D.   On: 10/16/2018 13:03   Dg Chest Port 1 View  Result Date: 10/07/2018 CLINICAL DATA:  Shortness of breath. EXAM: PORTABLE CHEST 1 VIEW COMPARISON:  10/01/2017. FINDINGS: Left lower chest incompletely imaged. Cardiomegaly with diffuse bilateral interstitial prominence and bilateral pleural effusions. Findings have progressed from prior exam are most consistent with CHF. A component of pleural scarring may be present. IMPRESSION: Congestive heart failure  with bilateral interstitial edema. Bibasilar pneumonia can not be excluded. Bilateral pleural effusions. A component of pleural scarring may be present. Electronically Signed   By: Marcello Moores  Register   On: 10/07/2018 08:58   Dg Abd Portable 1v  Result Date: 11/03/2018 CLINICAL DATA:  Constipation EXAM: PORTABLE ABDOMEN - 1 VIEW COMPARISON:  11/01/2018 FINDINGS: There is a non obstructive bowel gas pattern. No supine evidence of free air. No organomegaly or suspicious calcification. No acute bony abnormality. IMPRESSION: No acute findings. Electronically Signed   By: Rolm Baptise M.D.   On: 11/03/2018  11:32   Dg Abd Portable 1v  Result Date: 11/01/2018 CLINICAL DATA:  Constipation EXAM: PORTABLE ABDOMEN - 1 VIEW COMPARISON:  CT 01/09/2015 FINDINGS: Large stool burden throughout the colon. No evidence of bowel obstruction or free air. Prior cholecystectomy. Surgical sutures in the left upper abdomen. IMPRESSION: Large stool burden.  No bowel obstruction or free air. Electronically Signed   By: Rolm Baptise M.D.   On: 11/01/2018 11:03   Dg Foot 2 Views Left  Result Date: 10/17/2018 CLINICAL DATA:  Pain in left foot. EXAM: LEFT FOOT - 2 VIEW COMPARISON:  None. FINDINGS: Osteopenia. No fractures in the toes or metacarpals. Enthesopathic change at the posterior calcaneus. There is a lucency along the inferior aspect of the calcaneus which does not appear to extend through the calcaneus and is relatively well corticated. No other bony abnormalities. IMPRESSION: 1. There is a lucency along the inferior aspect of the calcaneus which is relatively well corticated and does not appear to extend through the calcaneus. This is favored to be nonacute. Recommend clinical correlation to exclude pain in this region. Electronically Signed   By: Dorise Bullion III M.D   On: 10/17/2018 15:05   Dg Fluoro Guided Needle Plc Aspiration/injection Loc  Result Date: 10/15/2018 CLINICAL DATA:  Left hip pain, evaluate for fluid  collection EXAM: LEFT HIP ASPIRATION UNDER FLUOROSCOPY FLUOROSCOPY TIME:  Fluoroscopy Time:  18 seconds Radiation Exposure Index (if provided by the fluoroscopic device): 2.3 mGy Number of Acquired Spot Images: 1 PROCEDURE: Overlying skin prepped with Betadine, draped in the usual sterile fashion, and infiltrated locally with buffered Lidocaine. 20 gauge spinal needle advanced to the prosthetic junction of the femoral head/neck. 1 ml of Lidocaine injected easily. No fluid/frank infection could be aspirated. 4 mL Lidocaine and 6 mL sterile saline administered into the joint with attempted subsequent aspiration. Less than 1 mL fluid was aspirated and sent for laboratory evaluation. No immediate complication. IMPRESSION: Technically successful left hip aspiration under fluoroscopy, as described above. Electronically Signed   By: Julian Hy M.D.   On: 10/15/2018 11:18   Dg Hips Bilat With Pelvis 2v  Result Date: 10/16/2018 CLINICAL DATA:  Fall.  Pain. EXAM: DG HIP (WITH OR WITHOUT PELVIS) 2V BILAT COMPARISON:  None. FINDINGS: The patient is status post bilateral hip replacements. Visualized hardware is in good position. No acute fractures. IMPRESSION: Bilateral hip replacements.  No acute fractures. Electronically Signed   By: Dorise Bullion III M.D   On: 10/16/2018 20:30   [place] Ekg Site Rite  Result Date: 10/15/2018 If Site Rite image not attached, placement could not be confirmed due to current cardiac rhythm.    LOS: 31 days   Signature  Lala Lund M.D on 11/04/2018 at 8:58 AM   -  To page go to [email or web address]

## 2018-11-04 NOTE — Progress Notes (Signed)
Physical Therapy Treatment Patient Details Name: [name] MRN: [id number] DOB: April 26, 1946 Today's Date: 11/04/2018    History of Present Illness Pt is a 73 y.o. male admitted 10/01/18 with generalized weakness, falls and SOB. Workup revealed afib with recurrent R-side pleural effusion; AKI. Pt with L hip cellulitis with chronic L hip prosthetic joint infection; s/p joint aspiration 2/14. PMH includes HTN, afib on Eliquis, OSA, bipolar disorder, obesity, L THA with infection and multiple prolonged hospitalizations.   PT Comments    Pt remains self-limiting during treatment sessions. Currently requires mod-maxA for bed mobility; performed multiple standing trials with modA (+1-2) and RW; pt with difficulty achieving fully upright posture due to c/o dizziness, weakness, anxiety and bowel incontinence requiring multiple transfers onto bed pain. Max encouragement for attempts to progress mobility throughout. Recommend nursing staff continue using maximove for OOB transfers.   Follow Up Recommendations  SNF     Equipment Recommendations  None recommended by PT    Recommendations for Other Services       Precautions / Restrictions Precautions Precautions: Fall Restrictions Weight Bearing Restrictions: No    Mobility  Bed Mobility Overal bed mobility: Needs Assistance Bed Mobility: Supine to Sit;Sit to Supine;Rolling Rolling: Mod assist   Supine to sit: Mod assist;HOB elevated Sit to supine: Max assist   General bed mobility comments: ModA to roll onto L-side for bed pan placement; this occurred 3x due to bowel incontinence. Pt distracted throughout, self-limiting, requiring multiple cues for movement and redirection to task. ModA to assist trunk elevation with HOB fully elevated, although pt requesting for more assist. MaxA to assist BLEs into supine. With bed flat and trendelenberg, pt able to slide himself up mod indep with LUE pulling on head rail  Transfers Overall transfer  level: Needs assistance Equipment used: Rolling walker (2 wheeled);None Transfers: Sit to/from Stand Sit to Stand: Mod assist;+2 physical assistance         General transfer comment: Planned for stand pivot to [facility]. Pt performed 5x total stands from elevated bed height with modA (x1-2 assist each trial). Pt with fear of falling and intermittently "having a panic attack", unable to achieve fully upright standing any trial despite max encouragement/cues. Ultimately needing to return to supine due to c/o dizziness and anxiety  Ambulation/Gait                 Stairs             Wheelchair Mobility    Modified Rankin (Stroke Patients Only)       Balance Overall balance assessment: Needs assistance Sitting-balance support: Feet supported;No upper extremity supported Sitting balance-[name]: Fair       Standing balance-[name]: Poor Standing balance comment: Heavy reliance on BUE support, some external assist                            Cognition Arousal/Alertness: Awake/alert Behavior During Therapy: Anxious;Agitated Overall Cognitive Status: Within Functional Limits for tasks assessed                                 General Comments: rigid behaviors and fearful of movement      Exercises      General Comments General comments (skin integrity, edema, etc.): Self-limiting      Pertinent Vitals/Pain Pain Assessment: Faces Faces Pain Scale: Hurts little more Pain Location: R shoulder > L  hip Pain Descriptors / Indicators: Grimacing;Guarding Pain Intervention(s): Monitored during session;Limited activity within patient's tolerance    Home Living                      Prior Function            PT Goals (current goals can now be found in the care plan section) Acute Rehab PT Goals Patient Stated Goal: to get stronger and get home PT Goal Formulation: With patient Time For Goal Achievement: 11/06/18 Potential to  Achieve Goals: Poor Progress towards PT goals: Not progressing toward goals - comment(self-limiting)    Frequency    Min 2X/week      PT Plan Current plan remains appropriate    Co-evaluation              AM-PAC PT "6 Clicks" Mobility   Outcome Measure  Help needed turning from your back to your side while in a flat bed without using bedrails?: A Lot Help needed moving from lying on your back to sitting on the side of a flat bed without using bedrails?: A Lot Help needed moving to and from a bed to a chair (including a wheelchair)?: Total Help needed standing up from a chair using your arms (e.g., wheelchair or bedside chair)?: Total Help needed to walk in hospital room?: Total Help needed climbing 3-5 steps with a railing? : Total 6 Click Score: 8    End of Session Equipment Utilized During Treatment: Gait belt Activity Tolerance: Patient tolerated treatment well;Other (comment)(pt self-limiting) Patient left: in bed;with call bell/phone within reach;with bed alarm set Nurse Communication: Mobility status PT Visit Diagnosis: Other abnormalities of gait and mobility (R26.89);Muscle weakness (generalized) (M62.81);Difficulty in walking, not elsewhere classified (R26.2);History of falling (Z91.81)     Time: 7026-3785 PT Time Calculation (min) (ACUTE ONLY): 41 min  Charges:  $Gait Training: 8-22 mins $Therapeutic Activity: 23-37 mins                    Mabeline Caras, PT, DPT Acute Rehabilitation Services  Pager [phone] Office [place] 11/04/2018, 3:57 PM

## 2018-11-04 NOTE — Progress Notes (Signed)
Nutrition Follow-up  DOCUMENTATION CODES:   Morbid obesity  INTERVENTION:  - Continue Ensure Max po BID, each supplement provides 150 kcal and 30 grams of protein.  - Continue MVI daily   NUTRITION DIAGNOSIS:   Increased nutrient needs related to chronic illness(Cirrhosis ) as evidenced by estimated needs.  Ongoing  GOAL:   Patient will meet greater than or equal to 90% of their needs  Progressing  MONITOR:   PO intake, Supplement acceptance, Labs, Weight trends, Skin, I & O's  REASON FOR ASSESSMENT:   Malnutrition Screening Tool    ASSESSMENT:   73 y.o. male with medical history significant for chronic back and right shoulder pain, OSA on CPAP, paroxysmal atrial fibrillation on Eliquis, hepatic cirrhosis, recurrent pleural effusions, and status post left total hip arthroplasty complicated by infection  Spoke with pt at bedside.  Pt is tolerating Ensure Max well and will continue to drink it twice a day. Pt was not amendable to drinking 3 times. Pt reports that he is having trouble with his food; occasionally he will not get a tray or is missing items. Will change to assist and place managers check on trays. Pt reports that sometimes he does not order food because he does not feel like eating; encouraged pt to have good PO intake to assist with healing.  Pt is experiencing some constipation, had a BM 2 days ago.   Per chart meal completion is between 75-100%. Will continue to monitor PO intake and supplement acceptance.   Medications reviewed and include: Dulcolax 10mg  daily, Chronulac 30g 2x, Miralax 17 2x, MVI, Protonix 40mg , Ensure Max BID, Thiamine 100mg  Labs reviewed.  Diet Order:   Diet Order            Diet 2 gram sodium Room service appropriate? Yes; Fluid consistency: Thin  Diet effective now        Diet - low sodium heart healthy              EDUCATION NEEDS:   Education needs have been addressed  Skin:  Skin Assessment: Skin Integrity  Issues: Skin Integrity Issues:: Incisions, Other (Comment) Incisions: L hip Other: MASD bilateral on buttocks  Last BM:  3/3  Height:   Ht Readings from Last 1 Encounters:  10/01/18 5\' 8"  (1.727 m)    Weight:   Wt Readings from Last 1 Encounters:  11/04/18 122 kg    Ideal Body Weight:  70 kg  BMI:  Body mass index is 40.9 kg/m.  Estimated Nutritional Needs:   Kcal:  2250-2450  Protein:  125-140 grams  Fluid:  2.2-2.4 L    [employer]

## 2018-11-04 NOTE — Progress Notes (Signed)
Pt has home unit CPAP.  Water added to the chamber.

## 2018-11-05 MED ORDER — DICLOFENAC SODIUM 1 % TD GEL
2.0000 g | Freq: Two times a day (BID) | TRANSDERMAL | Status: DC
  Administered 2018-11-05 – 2018-12-06 (×64): 2 g via TOPICAL
  Filled 2018-11-05 (×2): qty 100

## 2018-11-05 NOTE — Care Management Important Message (Signed)
Important Message  Patient Details  Name: [name] MRN: [id number] Date of Birth: Nov 25, 1945   Medicare Important Message Given:  Yes    Shelda Altes 11/05/2018, 2:04 PM

## 2018-11-05 NOTE — Progress Notes (Signed)
PROGRESS NOTE        PATIENT DETAILS Name: [name] Age: 73 y.o. Sex: male Date of Birth: [birth year] Admit Date: 10/01/2018 Admitting Physician Vianne Bulls, MD [id number], Hollice Espy, MD  Brief Narrative: Patient is a 73 y.o. male with history of A. 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  Found to have a soft tissue infection of the left hip area, acute kidney injury and hyperkalemia.  ID, orthopedics consulted-underwent left hip aspiration on 2/14, infectious disease recommending 6 weeks of IV Invanz.   See below for further details  Subjective:  Patient in bed, appears comfortable, denies any headache, no fever, no chest pain or pressure, no shortness of breath , no abdominal pain. No focal weakness.  Does have mild right shoulder pain. .  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  Was on suppressive Flagyl for Bacteroides fragilis infection-recommendations from ID are for 6 weeks of IV ertapenem-end date of November 25, 2018. Post DC follow up with primary orthopedic surgeon Dr. Ricki Rodriguez and ID physician at [facility].  AKI: Likely hemodynamically mediated-secondary to soft tissue infection of the left hip, diuretic use.  Resolved with supportive care.  Follow electrolytes periodically.  Hyperkalemia: Suspect secondary to AKI with use of Aldactone  and potassium supplementation.  Resolved.  History of left total hip arthroplasty with subsequent infection with Bacteroides fragilis: Patient was evaluated by infectious disease at [facility] was previously on Flagyl.  Now on IV Invanz-Flagyl has been discontinued.  Recurrent pleural effusion: Felt to be secondary to hepatic hydrothorax-most recent CT chest on 10/07/2018 shows a small pleural effusion-patient is asymptomatic at this point.  Doubt further work-up required-furthermore he has had a recent extensive work-up including multiple thoracocentesis   Cryptogenic cirrhosis: Prior hepatitis serology done earlier this month was negative-agree with plans outlined in prior discharge summary for outpatient follow-up.  Volume status is stable-he is awake and alert-continue Aldactone  Atrial fibrillation: Rate controlled with Cardizem.Continue Eliquis.  Chronic back and right shoulder pain: Continue Lidoderm patch, Tylenol and as needed narcotics.  Will add NSAID cream to the right shoulder twice daily for better control of his discomfort.  Bipolar disorder: Stable-continue Lexapro, Neurontin  Constipation: Large stool burden noted on x-ray on 11/02/2018, much improved after 2 L of GoLYTELY, repeat x-ray to evaluate stool burden, he clinically feels much better, note patient frequently requests to be manually disimpacted without much stool evidence in the rectal vault.  Right shoulder pain: Claims he has severe arthritis-and has required cortisone shots in the past-spoke with Orion Crook, PA-C with orthopedics on 2/26, orthopedic service does not do inpatient cortisone shots.  Will manage with supportive care.  Shoulder exam is benign-with no signs of infection.  OSA: Continue CPAP nightly  Morbid obesity  Acute on chronic debility/deconditioning: Has chronic debility/deconditioning at baseline-this is worsened due to  acute illness/AKI/left hip infection.  Per social  work-very difficult to place to SNF.    DVT Prophylaxis: Full dose anticoagulation with Eliquis  Code Status: Full code  Family Communication: None at bedside  Disposition Plan: Remain inpatient- SNF on discharge when bed available  Antimicrobial agents: Anti-infectives (From admission, onward)   Start     Dose/Rate Route Frequency Ordered Stop   10/15/18 1500  cefTRIAXone (ROCEPHIN) 2 g in sodium chloride 0.9 % 100 mL IVPB  Status:  Discontinued     2 g 200 mL/hr over 30 Minutes Intravenous Every 24 hours 10/15/18 1434 10/15/18 1447   10/15/18 1500  ertapenem (INVANZ) 1,000 mg in sodium chloride 0.9 % 100 mL IVPB     1 g 200 mL/hr over 30 Minutes Intravenous Every 24 hours 10/15/18 1447 11/25/18 2359   10/08/18 0000  doxycycline (VIBRA-TABS) 100 MG tablet     100 mg Oral Every 12 hours 10/08/18 0921     10/06/18 1000  doxycycline (VIBRA-TABS) tablet 100 mg  Status:  Discontinued     100 mg Oral Every 12 hours 10/06/18 0726 10/11/18 1328   10/03/18 1800  metroNIDAZOLE (FLAGYL) tablet 500 mg  Status:  Discontinued     500 mg Oral Every 8 hours 10/03/18 1054 10/15/18 1447   10/03/18 0700  vancomycin (VANCOCIN) 2,000 mg in sodium chloride 0.9 % 500 mL IVPB  Status:  Discontinued     2,000 mg 250 mL/hr over 120 Minutes Intravenous Every 36 hours 10/01/18 1942 10/06/18 0726   10/02/18 0200  ceFEPIme (MAXIPIME) 2 g in sodium chloride 0.9 % 100 mL IVPB  Status:  Discontinued     2 g 200 mL/hr over 30 Minutes Intravenous Every 8 hours 10/01/18 1942 10/02/18 1008   10/01/18 1830  vancomycin (VANCOCIN) 2,500 mg in sodium chloride 0.9 % 500 mL IVPB     2,500 mg 250 mL/hr over 120 Minutes Intravenous STAT 10/01/18 1825 10/02/18 0126   10/01/18 1800  ceFEPIme (MAXIPIME) 2 g in sodium chloride 0.9 % 100 mL IVPB     2 g 200 mL/hr over 30 Minutes Intravenous  Once 10/01/18 1747 10/01/18 1903   10/01/18 1800  metroNIDAZOLE (FLAGYL) IVPB 500 mg  Status:  Discontinued     500 mg 100 mL/hr  over 60 Minutes Intravenous Every 8 hours 10/01/18 1747 10/03/18 1054   10/01/18 1800  vancomycin (VANCOCIN) IVPB 1000 mg/200 mL premix  Status:  Discontinued     1,000 mg 200 mL/hr over 60 Minutes Intravenous  Once 10/01/18 1747 10/01/18 1825      Procedures: None  CONSULTS:  None  Time spent: 15- minutes-Greater than 50% of this time was spent in counseling, explanation of diagnosis, planning of further management, and coordination of care.  MEDICATIONS: Scheduled Meds: . apixaban  5 mg Oral BID  . bisacodyl  10 mg Oral Daily  . cycloSPORINE  1 drop Both Eyes BID  . diclofenac sodium  2 g Topical BID  . diltiazem  180 mg Oral Daily  . escitalopram  10 mg Oral Daily  . gabapentin  100 mg Oral TID  . lidocaine  1 patch Transdermal Q24H  . Melatonin  6 mg Oral QHS  . midodrine  10 mg Oral TID WC  . multivitamin with minerals  1 tablet Oral Daily  . pantoprazole  40 mg Oral Daily  . Ensure Max Protein  11 oz Oral BID  . spironolactone  50 mg Oral Daily  . thiamine  100 mg Oral Daily   Continuous Infusions: . sodium chloride 250 mL (10/24/18 0052)  . ertapenem 1,000 mg (11/04/18 1429)   PRN Meds:.sodium chloride, acetaminophen, albuterol, alum & mag hydroxide-simeth, diltiazem, hydrOXYzine, menthol-cetylpyridinium, [DISCONTINUED] ondansetron **OR** ondansetron (ZOFRAN) IV, oxyCODONE, promethazine, sodium chloride flush, traZODone, zolpidem   PHYSICAL EXAM: Vital signs: Vitals:   11/04/18 2121 11/05/18 0500 11/05/18 0517 11/05/18 0933  BP: (!) 109/59  100/68 93/68  Pulse: (!) 102  88 93  Resp: 18  18   Temp: 98.5 F (36.9 C)  98 F (36.7 C)   TempSrc: Oral  Oral   SpO2: 97%  94%   Weight:  120.6 kg    Height:       Filed Weights   11/02/18 0625 11/04/18 0455 11/05/18 0500  Weight: 120.6 kg 122 kg 120.6 kg   Body mass index is 40.43 kg/m.   Exam  Awake Alert, Oriented X 3, No new F.N deficits, Normal affect [place].AT,PERRAL Supple Neck,No JVD, No cervical  lymphadenopathy appriciated.  Symmetrical Chest wall movement, Good air movement bilaterally, CTAB RRR,No Gallops, Rubs or new Murmurs, No Parasternal Heave +ve B.Sounds, Abd Soft, No tenderness, No organomegaly appriciated, No rebound - guarding or rigidity. No Cyanosis, Clubbing or edema, chronic left hip redness, no warmth.  This is chronic.   LABORATORY DATA: CBC: Recent Labs  Lab 10/30/18 0341 11/02/18 0501  WBC 6.7 6.3  HGB 10.6* 11.2*  HCT 33.8* 36.0*  MCV 90.1 89.8  PLT 260 376    Basic Metabolic Panel: Recent Labs  Lab 10/30/18 0341 11/02/18 0501  NA 137 136  K 4.3 4.7  CL 103 102  CO2 26 27  GLUCOSE 95 104*  BUN 27* 29*  CREATININE 0.95 0.92  CALCIUM 10.0 9.3  MG  --  1.8    GFR: Estimated Creatinine Clearance: 91.7 mL/min (by C-G formula based on SCr of 0.92 mg/dL).  Liver Function Tests: No results for input(s): AST, ALT, ALKPHOS, BILITOT, PROT, ALBUMIN in the last 168 hours. No results for input(s): LIPASE, AMYLASE in the last 168 hours. No results for input(s): AMMONIA in the last 168 hours.  Coagulation Profile: No results for input(s): INR, PROTIME in the last 168 hours.  Cardiac Enzymes: No results for input(s): CKTOTAL, CKMB, CKMBINDEX, TROPONINI in the last 168 hours.  BNP (last 3 results) No results for input(s): PROBNP in the last 8760 hours.  HbA1C: No results for input(s): HGBA1C in the last 72 hours.  CBG: Recent Labs  Lab 10/30/18 0753 10/31/18 0816 11/01/18 0804 11/02/18 0822  GLUCAP 85 83 86 83    Lipid Profile: No results for input(s): CHOL, HDL, LDLCALC, TRIG, CHOLHDL, LDLDIRECT in the last 72 hours.  Thyroid Function Tests: No results for input(s): TSH, T4TOTAL, FREET4, T3FREE, THYROIDAB in the last 72 hours.  Anemia Panel: No results for input(s): VITAMINB12, FOLATE, FERRITIN, TIBC, IRON, RETICCTPCT in the last 72 hours.  Urine analysis:    Component Value Date/Time   COLORURINE YELLOW 10/01/2018 1822    APPEARANCEUR CLEAR 10/01/2018 1822   APPEARANCEUR Clear 01/18/2018 1539   LABSPEC 1.010 10/01/2018 1822   PHURINE 5.0 10/01/2018 1822   GLUCOSEU NEGATIVE 10/01/2018 1822   HGBUR NEGATIVE 10/01/2018 1822   BILIRUBINUR NEGATIVE 10/01/2018 1822   BILIRUBINUR Negative 01/18/2018 [place] 10/01/2018 1822   PROTEINUR NEGATIVE 10/01/2018 1822   UROBILINOGEN 0.2 09/06/2012 0213   NITRITE NEGATIVE 10/01/2018 1822   LEUKOCYTESUR NEGATIVE 10/01/2018 1822   LEUKOCYTESUR Negative 01/18/2018 1539  Sepsis Labs: Lactic Acid, Venous    Component Value Date/Time   LATICACIDVEN 1.7 10/01/2018 1833    MICROBIOLOGY:  No results found for this or any previous visit (from the past 240 hour(s)).  RADIOLOGY STUDIES/RESULTS:  Dg Knee 1-2 Views Left  Result Date: 10/16/2018 CLINICAL DATA:  Pain after fall EXAM: LEFT KNEE - 1-2 VIEW COMPARISON:  None. FINDINGS: No acute fracture or dislocation. No joint effusion. Moderate to severe degenerative changes. IMPRESSION: Degenerative changes.  No acute fracture noted. Electronically Signed   By: Dorise Bullion III M.D   On: 10/16/2018 20:32   Dg Knee 1-2 Views Right  Result Date: 10/16/2018 CLINICAL DATA:  Pain after fall EXAM: RIGHT KNEE - 1-2 VIEW COMPARISON:  None. FINDINGS: No acute fracture or effusion. Moderate to severe degenerative changes. IMPRESSION: No acute abnormality. Electronically Signed   By: Dorise Bullion III M.D   On: 10/16/2018 20:32   Ct Chest Wo Contrast  Result Date: 10/07/2018 CLINICAL DATA:  Chronic dyspnea. EXAM: CT CHEST WITHOUT CONTRAST TECHNIQUE: Multidetector CT imaging of the chest was performed following the standard protocol without IV contrast. COMPARISON:  Portable chest obtained earlier today and chest CTA dated 08/17/2018. FINDINGS: Cardiovascular: Atheromatous calcifications, including the coronary arteries and aorta. Stable borderline enlargement of the heart with mild biatrial enlargement. No  pericardial effusion. Mediastinum/Nodes: No enlarged mediastinal or axillary lymph nodes. Thyroid gland, trachea, and esophagus demonstrate no significant findings. Lungs/Pleura: Small to moderate-sized right pleural effusion. Stable left pleural thickening with calcifications. Mild bilateral atelectasis and scarring. Upper Abdomen: Post gastric bypass changes. Musculoskeletal: Mild dextroconvex thoracolumbar scoliosis. Extensive thoracic and lower cervical spine degenerative changes. IMPRESSION: 1. Small to moderate-sized right pleural effusion. 2. Mild bilateral atelectasis and scarring. 3. 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 4.  Calcific coronary artery and aortic atherosclerosis. Aortic Atherosclerosis (ICD10-I70.0). Electronically Signed   By: Claudie Revering M.D.   On: 10/07/2018 20:31   Dg Chest Port 1 View  Result Date: 10/16/2018 CLINICAL DATA:  PICC line placement. EXAM: PORTABLE CHEST 1 VIEW COMPARISON:  Radiographs and CT 10/07/2018. FINDINGS: 1223 hours. Left arm PICC tip is not optimally visualized, although appears to extend to the lower SVC level. The heart size and mediastinal contours are stable. There is aortic atherosclerosis. Bilateral pleural thickening and subpleural rounded atelectasis peripherally in the left lung are unchanged. There is no pneumothorax. Glenohumeral degenerative changes are present bilaterally. IMPRESSION: PICC line tip at the lower SVC level. No pneumothorax or acute findings. Electronically Signed   By: Richardean Sale M.D.   On: 10/16/2018 13:03   Dg Chest Port 1 View  Result Date: 10/07/2018 CLINICAL DATA:  Shortness of breath. EXAM: PORTABLE CHEST 1 VIEW COMPARISON:  10/01/2017. FINDINGS: Left lower chest incompletely imaged. Cardiomegaly with diffuse bilateral interstitial prominence and bilateral pleural effusions. Findings have progressed from prior exam are most consistent with CHF. A component of  pleural scarring may be present. IMPRESSION: Congestive heart failure with bilateral interstitial edema. Bibasilar pneumonia can not be excluded. Bilateral pleural effusions. A component of pleural scarring may be present. Electronically Signed   By: Marcello Moores  Register   On: 10/07/2018 08:58   Dg Abd Portable 1v  Result Date: 11/03/2018 CLINICAL DATA:  Constipation EXAM: PORTABLE ABDOMEN - 1 VIEW COMPARISON:  11/01/2018 FINDINGS: There is a non obstructive bowel gas pattern. No supine evidence of free air. No organomegaly or suspicious calcification. No acute bony abnormality. IMPRESSION: No acute findings. Electronically Signed  By: Rolm Baptise M.D.   On: 11/03/2018 11:32   Dg Abd Portable 1v  Result Date: 11/01/2018 CLINICAL DATA:  Constipation EXAM: PORTABLE ABDOMEN - 1 VIEW COMPARISON:  CT 01/09/2015 FINDINGS: Large stool burden throughout the colon. No evidence of bowel obstruction or free air. Prior cholecystectomy. Surgical sutures in the left upper abdomen. IMPRESSION: Large stool burden.  No bowel obstruction or free air. Electronically Signed   By: Rolm Baptise M.D.   On: 11/01/2018 11:03   Dg Foot 2 Views Left  Result Date: 10/17/2018 CLINICAL DATA:  Pain in left foot. EXAM: LEFT FOOT - 2 VIEW COMPARISON:  None. FINDINGS: Osteopenia. No fractures in the toes or metacarpals. Enthesopathic change at the posterior calcaneus. There is a lucency along the inferior aspect of the calcaneus which does not appear to extend through the calcaneus and is relatively well corticated. No other bony abnormalities. IMPRESSION: 1. There is a lucency along the inferior aspect of the calcaneus which is relatively well corticated and does not appear to extend through the calcaneus. This is favored to be nonacute. Recommend clinical correlation to exclude pain in this region. Electronically Signed   By: Dorise Bullion III M.D   On: 10/17/2018 15:05   Dg Fluoro Guided Needle Plc Aspiration/injection Loc  Result  Date: 10/15/2018 CLINICAL DATA:  Left hip pain, evaluate for fluid collection EXAM: LEFT HIP ASPIRATION UNDER FLUOROSCOPY FLUOROSCOPY TIME:  Fluoroscopy Time:  18 seconds Radiation Exposure Index (if provided by the fluoroscopic device): 2.3 mGy Number of Acquired Spot Images: 1 PROCEDURE: Overlying skin prepped with Betadine, draped in the usual sterile fashion, and infiltrated locally with buffered Lidocaine. 20 gauge spinal needle advanced to the prosthetic junction of the femoral head/neck. 1 ml of Lidocaine injected easily. No fluid/frank infection could be aspirated. 4 mL Lidocaine and 6 mL sterile saline administered into the joint with attempted subsequent aspiration. Less than 1 mL fluid was aspirated and sent for laboratory evaluation. No immediate complication. IMPRESSION: Technically successful left hip aspiration under fluoroscopy, as described above. Electronically Signed   By: Julian Hy M.D.   On: 10/15/2018 11:18   Dg Hips Bilat With Pelvis 2v  Result Date: 10/16/2018 CLINICAL DATA:  Fall.  Pain. EXAM: DG HIP (WITH OR WITHOUT PELVIS) 2V BILAT COMPARISON:  None. FINDINGS: The patient is status post bilateral hip replacements. Visualized hardware is in good position. No acute fractures. IMPRESSION: Bilateral hip replacements.  No acute fractures. Electronically Signed   By: Dorise Bullion III M.D   On: 10/16/2018 20:30   [place] Ekg Site Rite  Result Date: 10/15/2018 If Site Rite image not attached, placement could not be confirmed due to current cardiac rhythm.    LOS: 32 days   Signature  Lala Lund M.D on 11/05/2018 at 9:39 AM   -  To page go to [email or web address]

## 2018-11-05 NOTE — Progress Notes (Signed)
Pt has home CPAP unit it the room.

## 2018-11-05 NOTE — Progress Notes (Signed)
Palliative:  Remains hospitalized d/t disposition issues. Please re-consult if patient expresses interest in pursuing comfort measures or any change in status. Will sign off.   No charge  Vinie Sill, NP Palliative Medicine Team Pager # [phone] (M-F 8a-5p) Team Phone # [phone] (Nights/Weekends)

## 2018-11-06 LAB — GLUCOSE, CAPILLARY: Glucose-Capillary: 83 mg/dL (ref 70–99)

## 2018-11-06 NOTE — Progress Notes (Signed)
RT checked patient's home CPAP and filled with sterile water. Patient places himself on/off CPAP without assistance. RT will monitor as needed.

## 2018-11-06 NOTE — Progress Notes (Addendum)
PROGRESS NOTE        PATIENT DETAILS Name: [name] Age: 73 y.o. Sex: male Date of Birth: 10-01-1945 Admit Date: 10/01/2018 Admitting Physician Vianne Bulls, MD OBS:[id number], Hollice Espy, MD  Brief Narrative: Patient is a 73 y.o. male with history of A. 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  Found to have a soft tissue infection of the left hip area, acute kidney injury and hyperkalemia.  ID, orthopedics consulted-underwent left hip aspiration on 2/14, infectious disease recommending 6 weeks of IV Invanz.   See below for further details.   Patient is medically stable await bed for placement.   Subjective:  Patient in bed, appears comfortable, denies any headache, no fever, no chest pain or pressure, no shortness of breath , no abdominal pain. No focal weakness.  Much improved right shoulder discomfort.   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  Was on suppressive Flagyl for Bacteroides fragilis infection-recommendations from ID are for 6 weeks of IV ertapenem-end date of November 25, 2018. Post DC follow up with primary orthopedic surgeon Dr. Ricki Rodriguez and ID physician at [facility]  AKI: Likely hemodynamically mediated-secondary to soft tissue infection of the left hip, diuretic use.  Resolved with supportive care.  Follow electrolytes  periodically.  Hyperkalemia: Suspect secondary to AKI with use of Aldactone and potassium supplementation.  Resolved.  History of left total hip arthroplasty with subsequent infection with Bacteroides fragilis: Patient was evaluated by infectious disease at [facility] was previously on Flagyl.  Now on IV Invanz-Flagyl has been discontinued.  Recurrent pleural effusion: Felt to be secondary to hepatic hydrothorax-most recent CT chest on 10/07/2018 shows a small pleural effusion-patient is asymptomatic at this point.  Doubt further work-up required-furthermore he has had a recent extensive work-up including multiple thoracocentesis   Cryptogenic cirrhosis: Prior hepatitis serology done earlier this month was negative-agree with plans outlined in prior discharge summary for outpatient follow-up.  Volume status is stable-he is awake and alert-continue Aldactone  Atrial fibrillation: Rate controlled with Cardizem.Continue Eliquis.  Chronic back and right shoulder pain: Continue Lidoderm patch, Tylenol and as needed narcotics.  Will add NSAID cream to the right shoulder twice daily for better control of his discomfort.  Bipolar disorder: Stable-continue Lexapro, Neurontin  Constipation: Large stool burden noted on x-ray on 11/02/2018, much improved after 2 L of GoLYTELY, repeat x-ray to evaluate stool burden, he clinically feels much better, note patient frequently requests to be manually disimpacted without much stool evidence in the rectal vault.  Right shoulder pain: Claims he has severe arthritis-and has required cortisone shots in the past-spoke with Orion Crook, PA-C with orthopedics on 2/26, orthopedic service does not do inpatient cortisone shots.  Lidoderm patch continued we added NSAID cream on 11/05/2018 with much relief.  OSA: Continue CPAP nightly  Morbid obesity  Acute on chronic debility/deconditioning: Has  chronic debility/deconditioning at baseline-this is  worsened due to acute illness/AKI/left hip infection.  Per social work-very difficult to place to SNF.    DVT Prophylaxis: Full dose anticoagulation with Eliquis  Code Status: Full code  Family Communication: None at bedside  Disposition Plan: Remain inpatient- SNF on discharge when bed available  Antimicrobial agents: Anti-infectives (From admission, onward)   Start     Dose/Rate Route Frequency Ordered Stop   10/15/18 1500  cefTRIAXone (ROCEPHIN) 2 g in sodium chloride 0.9 % 100 mL IVPB  Status:  Discontinued     2 g 200 mL/hr over 30 Minutes Intravenous Every 24 hours 10/15/18 1434 10/15/18 1447   10/15/18 1500  ertapenem (INVANZ) 1,000 mg in sodium chloride 0.9 % 100 mL IVPB     1 g 200 mL/hr over 30 Minutes Intravenous Every 24 hours 10/15/18 1447 11/25/18 2359   10/08/18 0000  doxycycline (VIBRA-TABS) 100 MG tablet     100 mg Oral Every 12 hours 10/08/18 0921     10/06/18 1000  doxycycline (VIBRA-TABS) tablet 100 mg  Status:  Discontinued     100 mg Oral Every 12 hours 10/06/18 0726 10/11/18 1328   10/03/18 1800  metroNIDAZOLE (FLAGYL) tablet 500 mg  Status:  Discontinued     500 mg Oral Every 8 hours 10/03/18 1054 10/15/18 1447   10/03/18 0700  vancomycin (VANCOCIN) 2,000 mg in sodium chloride 0.9 % 500 mL IVPB  Status:  Discontinued     2,000 mg 250 mL/hr over 120 Minutes Intravenous Every 36 hours 10/01/18 1942 10/06/18 0726   10/02/18 0200  ceFEPIme (MAXIPIME) 2 g in sodium chloride 0.9 % 100 mL IVPB  Status:  Discontinued     2 g 200 mL/hr over 30 Minutes Intravenous Every 8 hours 10/01/18 1942 10/02/18 1008   10/01/18 1830  vancomycin (VANCOCIN) 2,500 mg in sodium chloride 0.9 % 500 mL IVPB     2,500 mg 250 mL/hr over 120 Minutes Intravenous STAT 10/01/18 1825 10/02/18 0126   10/01/18 1800  ceFEPIme (MAXIPIME) 2 g in sodium chloride 0.9 % 100 mL IVPB     2 g 200 mL/hr over 30 Minutes Intravenous  Once 10/01/18 1747 10/01/18 1903   10/01/18 1800  metroNIDAZOLE  (FLAGYL) IVPB 500 mg  Status:  Discontinued     500 mg 100 mL/hr over 60 Minutes Intravenous Every 8 hours 10/01/18 1747 10/03/18 1054   10/01/18 1800  vancomycin (VANCOCIN) IVPB 1000 mg/200 mL premix  Status:  Discontinued     1,000 mg 200 mL/hr over 60 Minutes Intravenous  Once 10/01/18 1747 10/01/18 1825      Procedures: None  CONSULTS:  None  Time spent: 15- minutes-Greater than 50% of this time was spent in counseling, explanation of diagnosis, planning of further management, and coordination of care.  MEDICATIONS: Scheduled Meds: . apixaban  5 mg Oral BID  . bisacodyl  10 mg Oral Daily  . cycloSPORINE  1 drop Both Eyes BID  . diclofenac sodium  2 g Topical BID  . diltiazem  180 mg Oral Daily  . escitalopram  10 mg Oral Daily  . gabapentin  100 mg Oral TID  . lidocaine  1 patch Transdermal Q24H  . Melatonin  6 mg Oral QHS  . midodrine  10 mg Oral TID WC  . multivitamin with minerals  1 tablet Oral Daily  . pantoprazole  40 mg Oral Daily  . Ensure Max Protein  11 oz Oral BID  . spironolactone  50 mg Oral Daily  . thiamine  100 mg Oral Daily   Continuous Infusions: . sodium chloride 250 mL (10/24/18 0052)  . ertapenem 1,000 mg (11/05/18 1425)   PRN Meds:.sodium chloride, acetaminophen, albuterol, alum & mag hydroxide-simeth, diltiazem, hydrOXYzine, menthol-cetylpyridinium, [DISCONTINUED] ondansetron **OR** ondansetron (ZOFRAN) IV, oxyCODONE, promethazine, sodium chloride flush, traZODone, zolpidem   PHYSICAL EXAM: Vital signs: Vitals:   11/05/18 2212 11/06/18 0500 11/06/18 0506 11/06/18 0920  BP: 104/76  104/70 105/60  Pulse: (!) 103  89   Resp: 18  18   Temp: 98.6 F (37 C)  97.7 F (36.5 C)   TempSrc: Oral     SpO2: 95%  95%   Weight:  119.5 kg    Height:       Filed Weights   11/04/18 0455 11/05/18 0500 11/06/18 0500  Weight: 122 kg 120.6 kg 119.5 kg   Body mass index is 40.06 kg/m.   Exam  Awake Alert, Oriented X 3, No new F.N deficits, Normal  affect [place].AT,PERRAL Supple Neck,No JVD, No cervical lymphadenopathy appriciated.  Symmetrical Chest wall movement, Good air movement bilaterally, CTAB RRR,No Gallops, Rubs or new Murmurs, No Parasternal Heave +ve B.Sounds, Abd Soft, No tenderness, No organomegaly appriciated, No rebound - guarding or rigidity. No Cyanosis, Clubbing or edema, chronic left hip redness, no warmth.  This is chronic.   LABORATORY DATA: CBC: Recent Labs  Lab 11/02/18 0501  WBC 6.3  HGB 11.2*  HCT 36.0*  MCV 89.8  PLT 616    Basic Metabolic Panel: Recent Labs  Lab 11/02/18 0501  NA 136  K 4.7  CL 102  CO2 27  GLUCOSE 104*  BUN 29*  CREATININE 0.92  CALCIUM 9.3  MG 1.8    GFR: Estimated Creatinine Clearance: 91.2 mL/min (by C-G formula based on SCr of 0.92 mg/dL).  Liver Function Tests: No results for input(s): AST, ALT, ALKPHOS, BILITOT, PROT, ALBUMIN in the last 168 hours. No results for input(s): LIPASE, AMYLASE in the last 168 hours. No results for input(s): AMMONIA in the last 168 hours.  Coagulation Profile: No results for input(s): INR, PROTIME in the last 168 hours.  Cardiac Enzymes: No results for input(s): CKTOTAL, CKMB, CKMBINDEX, TROPONINI in the last 168 hours.  BNP (last 3 results) No results for input(s): PROBNP in the last 8760 hours.  HbA1C: No results for input(s): HGBA1C in the last 72 hours.  CBG: Recent Labs  Lab 10/31/18 0816 11/01/18 0804 11/02/18 0822 11/06/18 0755  GLUCAP 83 86 83 83    Lipid Profile: No results for input(s): CHOL, HDL, LDLCALC, TRIG, CHOLHDL, LDLDIRECT in the last 72 hours.  Thyroid Function Tests: No results for input(s): TSH, T4TOTAL, FREET4, T3FREE, THYROIDAB in the last 72 hours.  Anemia Panel: No results for input(s): VITAMINB12, FOLATE, FERRITIN, TIBC, IRON, RETICCTPCT in the last 72 hours.  Urine analysis:    Component Value Date/Time   COLORURINE YELLOW 10/01/2018 1822   APPEARANCEUR CLEAR 10/01/2018 1822    APPEARANCEUR Clear 01/18/2018 1539   LABSPEC 1.010 10/01/2018 1822   PHURINE 5.0 10/01/2018 1822   GLUCOSEU NEGATIVE 10/01/2018 1822   HGBUR NEGATIVE 10/01/2018 1822   BILIRUBINUR NEGATIVE 10/01/2018 1822   BILIRUBINUR Negative 01/18/2018 1539   KETONESUR NEGATIVE 10/01/2018 1822   PROTEINUR NEGATIVE 10/01/2018 1822   UROBILINOGEN 0.2 09/06/2012 0213   NITRITE NEGATIVE 10/01/2018 1822   LEUKOCYTESUR NEGATIVE 10/01/2018 1822   LEUKOCYTESUR Negative 01/18/2018 1539    Sepsis Labs: Lactic Acid, Venous    Component Value  Date/Time   LATICACIDVEN 1.7 10/01/2018 1833    MICROBIOLOGY:  No results found for this or any previous visit (from the past 240 hour(s)).  RADIOLOGY STUDIES/RESULTS:  Dg Knee 1-2 Views Left  Result Date: 10/16/2018 CLINICAL DATA:  Pain after fall EXAM: LEFT KNEE - 1-2 VIEW COMPARISON:  None. FINDINGS: No acute fracture or dislocation. No joint effusion. Moderate to severe degenerative changes. IMPRESSION: Degenerative changes.  No acute fracture noted. Electronically Signed   By: Dorise Bullion III M.D   On: 10/16/2018 20:32   Dg Knee 1-2 Views Right  Result Date: 10/16/2018 CLINICAL DATA:  Pain after fall EXAM: RIGHT KNEE - 1-2 VIEW COMPARISON:  None. FINDINGS: No acute fracture or effusion. Moderate to severe degenerative changes. IMPRESSION: No acute abnormality. Electronically Signed   By: Dorise Bullion III M.D   On: 10/16/2018 20:32   Ct Chest Wo Contrast  Result Date: 10/07/2018 CLINICAL DATA:  Chronic dyspnea. EXAM: CT CHEST WITHOUT CONTRAST TECHNIQUE: Multidetector CT imaging of the chest was performed following the standard protocol without IV contrast. COMPARISON:  Portable chest obtained earlier today and chest CTA dated 08/17/2018. FINDINGS: Cardiovascular: Atheromatous calcifications, including the coronary arteries and aorta. Stable borderline enlargement of the heart with mild biatrial enlargement. No pericardial effusion. Mediastinum/Nodes: No  enlarged mediastinal or axillary lymph nodes. Thyroid gland, trachea, and esophagus demonstrate no significant findings. Lungs/Pleura: Small to moderate-sized right pleural effusion. Stable left pleural thickening with calcifications. Mild bilateral atelectasis and scarring. Upper Abdomen: Post gastric bypass changes. Musculoskeletal: Mild dextroconvex thoracolumbar scoliosis. Extensive thoracic and lower cervical spine degenerative changes. IMPRESSION: 1. Small to moderate-sized right pleural effusion. 2. Mild bilateral atelectasis and scarring. 3. 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 4.  Calcific coronary artery and aortic atherosclerosis. Aortic Atherosclerosis (ICD10-I70.0). Electronically Signed   By: Claudie Revering M.D.   On: 10/07/2018 20:31   Dg Chest Port 1 View  Result Date: 10/16/2018 CLINICAL DATA:  PICC line placement. EXAM: PORTABLE CHEST 1 VIEW COMPARISON:  Radiographs and CT 10/07/2018. FINDINGS: 1223 hours. Left arm PICC tip is not optimally visualized, although appears to extend to the lower SVC level. The heart size and mediastinal contours are stable. There is aortic atherosclerosis. Bilateral pleural thickening and subpleural rounded atelectasis peripherally in the left lung are unchanged. There is no pneumothorax. Glenohumeral degenerative changes are present bilaterally. IMPRESSION: PICC line tip at the lower SVC level. No pneumothorax or acute findings. Electronically Signed   By: Richardean Sale M.D.   On: 10/16/2018 13:03   Dg Abd Portable 1v  Result Date: 11/03/2018 CLINICAL DATA:  Constipation EXAM: PORTABLE ABDOMEN - 1 VIEW COMPARISON:  11/01/2018 FINDINGS: There is a non obstructive bowel gas pattern. No supine evidence of free air. No organomegaly or suspicious calcification. No acute bony abnormality. IMPRESSION: No acute findings. Electronically Signed   By: Rolm Baptise M.D.   On: 11/03/2018 11:32   Dg Abd Portable  1v  Result Date: 11/01/2018 CLINICAL DATA:  Constipation EXAM: PORTABLE ABDOMEN - 1 VIEW COMPARISON:  CT 01/09/2015 FINDINGS: Large stool burden throughout the colon. No evidence of bowel obstruction or free air. Prior cholecystectomy. Surgical sutures in the left upper abdomen. IMPRESSION: Large stool burden.  No bowel obstruction or free air. Electronically Signed   By: Rolm Baptise M.D.   On: 11/01/2018 11:03   Dg Foot 2 Views Left  Result Date: 10/17/2018 CLINICAL DATA:  Pain in left foot. EXAM: LEFT FOOT - 2 VIEW COMPARISON:  None. FINDINGS: Osteopenia. No fractures in the toes or metacarpals. Enthesopathic change at the posterior calcaneus. There is a lucency along the inferior aspect of the calcaneus which does not appear to extend through the calcaneus and is relatively well corticated. No other bony abnormalities. IMPRESSION: 1. There is a lucency along the inferior aspect of the calcaneus which is relatively well corticated and does not appear to extend through the calcaneus. This is favored to be nonacute. Recommend clinical correlation to exclude pain in this region. Electronically Signed   By: Dorise Bullion III M.D   On: 10/17/2018 15:05   Dg Fluoro Guided Needle Plc Aspiration/injection Loc  Result Date: 10/15/2018 CLINICAL DATA:  Left hip pain, evaluate for fluid collection EXAM: LEFT HIP ASPIRATION UNDER FLUOROSCOPY FLUOROSCOPY TIME:  Fluoroscopy Time:  18 seconds Radiation Exposure Index (if provided by the fluoroscopic device): 2.3 mGy Number of Acquired Spot Images: 1 PROCEDURE: Overlying skin prepped with Betadine, draped in the usual sterile fashion, and infiltrated locally with buffered Lidocaine. 20 gauge spinal needle advanced to the prosthetic junction of the femoral head/neck. 1 ml of Lidocaine injected easily. No fluid/frank infection could be aspirated. 4 mL Lidocaine and 6 mL sterile saline administered into the joint with attempted subsequent aspiration. Less than 1 mL fluid  was aspirated and sent for laboratory evaluation. No immediate complication. IMPRESSION: Technically successful left hip aspiration under fluoroscopy, as described above. Electronically Signed   By: Julian Hy M.D.   On: 10/15/2018 11:18   Dg Hips Bilat With Pelvis 2v  Result Date: 10/16/2018 CLINICAL DATA:  Fall.  Pain. EXAM: DG HIP (WITH OR WITHOUT PELVIS) 2V BILAT COMPARISON:  None. FINDINGS: The patient is status post bilateral hip replacements. Visualized hardware is in good position. No acute fractures. IMPRESSION: Bilateral hip replacements.  No acute fractures. Electronically Signed   By: Dorise Bullion III M.D   On: 10/16/2018 20:30   [place] Ekg Site Rite  Result Date: 10/15/2018 If Site Rite image not attached, placement could not be confirmed due to current cardiac rhythm.    LOS: 33 days   Signature  Lala Lund M.D on 11/06/2018 at 10:01 AM   -  To page go to [email or web address]

## 2018-11-06 NOTE — Progress Notes (Signed)
   11/06/18 2000  Clinical Encounter Type  Visited With Health care provider;Patient  Visit Type Initial;Spiritual support;Psychological support  Referral From Nurse  Spiritual Encounters  Spiritual Needs Emotional  Stress Factors  Patient Stress Factors Major life changes;Health changes;Family relationships;Loss of control   Responded to consult request.  Visited w/ pt, Pt states he is depressed.  "Wouldn't do it b/c of my religious convictions, but if I were to just die right now, that would be ok."  I informed RN.  Pt talked about not wanting to do anything.  I noticed the blinds were closed, talked about that.  He keeps them drawn day and night, said his eyes are now sensitive to light, he has not talked to his dr about that.  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  He gets excited when talking about these two subjects.  [place]ra Gianotti resident, [phone]

## 2018-11-07 LAB — GLUCOSE, CAPILLARY
Glucose-Capillary: 82 mg/dL (ref 70–99)
Glucose-Capillary: 126 mg/dL — ABNORMAL HIGH (ref 70–99)

## 2018-11-07 MED ORDER — BISACODYL 5 MG PO TBEC
10.0000 mg | DELAYED_RELEASE_TABLET | Freq: Every day | ORAL | Status: DC
  Administered 2018-11-07 – 2018-11-08 (×2): 10 mg via ORAL
  Filled 2018-11-07 (×2): qty 2

## 2018-11-07 NOTE — Progress Notes (Signed)
PROGRESS NOTE        PATIENT DETAILS Name: [name] Age: 73 y.o. Sex: male Date of Birth: 1945/11/11 Admit Date: 10/01/2018 Admitting Physician Vianne Bulls, MD [id number], Hollice Espy, MD  Brief Narrative: Patient is a 73 y.o. male with history of A. 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  Found to have a soft tissue infection of the left hip area, acute kidney injury and hyperkalemia.  ID, orthopedics consulted-underwent left hip aspiration on 2/14, infectious disease recommending 6 weeks of IV Invanz.   See below for further details.   Patient is medically stable await bed for placement.   Subjective:  Patient in bed, appears comfortable, denies any headache, no fever, no chest pain or pressure, no shortness of breath , no abdominal pain. No focal weakness.   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  Was on suppressive Flagyl for Bacteroides fragilis infection-recommendations from ID are for 6 weeks of IV ertapenem-end date of November 25, 2018. Post DC follow up with primary orthopedic surgeon Dr. Ricki Rodriguez and ID physician at [facility].  AKI: Likely hemodynamically mediated-secondary to soft tissue infection of the left hip, diuretic use.  Resolved with supportive care.  Follow electrolytes periodically.  Hyperkalemia: Suspect secondary to  AKI with use of Aldactone and potassium supplementation.  Resolved.  History of left total hip arthroplasty with subsequent infection with Bacteroides fragilis: Patient was evaluated by infectious disease at [facility] was previously on Flagyl.  Now on IV Invanz-Flagyl has been discontinued.  Recurrent pleural effusion: Felt to be secondary to hepatic hydrothorax-most recent CT chest on 10/07/2018 shows a small pleural effusion-patient is asymptomatic at this point.  Doubt further work-up required-furthermore he has had a recent extensive work-up including multiple thoracocentesis   Cryptogenic cirrhosis: Prior hepatitis serology done earlier this month was negative-agree with plans outlined in prior discharge summary for outpatient follow-up.  Volume status is stable-he is awake and alert-continue Aldactone  Atrial fibrillation: Rate controlled with Cardizem.Continue Eliquis.  Chronic back and right shoulder pain: Continue Lidoderm patch, Tylenol and as needed narcotics.  Will add NSAID cream to the right shoulder twice daily for better control of his discomfort.  Bipolar disorder: Stable-continue Lexapro, Neurontin  Constipation: Large stool burden noted on x-ray on 11/02/2018, much improved after 2 L of GoLYTELY, repeat x-ray to evaluate stool burden, he clinically feels much better, note patient frequently requests to be manually disimpacted without much stool evidence in the rectal vault.  Right shoulder pain: Claims he has severe arthritis-and has required cortisone shots in the past-spoke with Orion Crook, PA-C with orthopedics on 2/26, orthopedic service does not do inpatient cortisone shots.  Lidoderm patch continued we added NSAID cream on 11/05/2018 with much relief.  OSA: Continue CPAP nightly  Morbid obesity  Acute on chronic debility/deconditioning: Has chronic debility/deconditioning at baseline-this is worsened  due to acute illness/AKI/left hip infection.   Per social work-very difficult to place to SNF.    DVT Prophylaxis: Full dose anticoagulation with Eliquis  Code Status: Full code  Family Communication: None at bedside  Disposition Plan: Remain inpatient- SNF on discharge when bed available  Antimicrobial agents: Anti-infectives (From admission, onward)   Start     Dose/Rate Route Frequency Ordered Stop   10/15/18 1500  cefTRIAXone (ROCEPHIN) 2 g in sodium chloride 0.9 % 100 mL IVPB  Status:  Discontinued     2 g 200 mL/hr over 30 Minutes Intravenous Every 24 hours 10/15/18 1434 10/15/18 1447   10/15/18 1500  ertapenem (INVANZ) 1,000 mg in sodium chloride 0.9 % 100 mL IVPB     1 g 200 mL/hr over 30 Minutes Intravenous Every 24 hours 10/15/18 1447 11/25/18 2359   10/08/18 0000  doxycycline (VIBRA-TABS) 100 MG tablet     100 mg Oral Every 12 hours 10/08/18 0921     10/06/18 1000  doxycycline (VIBRA-TABS) tablet 100 mg  Status:  Discontinued     100 mg Oral Every 12 hours 10/06/18 0726 10/11/18 1328   10/03/18 1800  metroNIDAZOLE (FLAGYL) tablet 500 mg  Status:  Discontinued     500 mg Oral Every 8 hours 10/03/18 1054 10/15/18 1447   10/03/18 0700  vancomycin (VANCOCIN) 2,000 mg in sodium chloride 0.9 % 500 mL IVPB  Status:  Discontinued     2,000 mg 250 mL/hr over 120 Minutes Intravenous Every 36 hours 10/01/18 1942 10/06/18 0726   10/02/18 0200  ceFEPIme (MAXIPIME) 2 g in sodium chloride 0.9 % 100 mL IVPB  Status:  Discontinued     2 g 200 mL/hr over 30 Minutes Intravenous Every 8 hours 10/01/18 1942 10/02/18 1008   10/01/18 1830  vancomycin (VANCOCIN) 2,500 mg in sodium chloride 0.9 % 500 mL IVPB     2,500 mg 250 mL/hr over 120 Minutes Intravenous STAT 10/01/18 1825 10/02/18 0126   10/01/18 1800  ceFEPIme (MAXIPIME) 2 g in sodium chloride 0.9 % 100 mL IVPB     2 g 200 mL/hr over 30 Minutes Intravenous  Once 10/01/18 1747 10/01/18 1903   10/01/18 1800  metroNIDAZOLE (FLAGYL) IVPB 500 mg  Status:  Discontinued     500  mg 100 mL/hr over 60 Minutes Intravenous Every 8 hours 10/01/18 1747 10/03/18 1054   10/01/18 1800  vancomycin (VANCOCIN) IVPB 1000 mg/200 mL premix  Status:  Discontinued     1,000 mg 200 mL/hr over 60 Minutes Intravenous  Once 10/01/18 1747 10/01/18 1825      Procedures: None  CONSULTS:  None  Time spent: 15- minutes-Greater than 50% of this time was spent in counseling, explanation of diagnosis, planning of further management, and coordination of care.  MEDICATIONS: Scheduled Meds: . apixaban  5 mg Oral BID  . bisacodyl  10 mg Oral Daily  . cycloSPORINE  1 drop Both Eyes BID  . diclofenac sodium  2 g Topical BID  . diltiazem  180 mg Oral Daily  . escitalopram  10 mg Oral Daily  . gabapentin  100 mg Oral TID  . lidocaine  1 patch Transdermal Q24H  . Melatonin  6 mg Oral QHS  . midodrine  10 mg Oral TID WC  . multivitamin with minerals  1 tablet Oral Daily  . pantoprazole  40 mg Oral Daily  . Ensure Max Protein  11 oz Oral BID  . spironolactone  50 mg Oral Daily  .  thiamine  100 mg Oral Daily   Continuous Infusions: . sodium chloride 250 mL (10/24/18 0052)  . ertapenem 1,000 mg (11/06/18 1600)   PRN Meds:.sodium chloride, acetaminophen, albuterol, alum & mag hydroxide-simeth, diltiazem, hydrOXYzine, menthol-cetylpyridinium, [DISCONTINUED] ondansetron **OR** ondansetron (ZOFRAN) IV, oxyCODONE, promethazine, sodium chloride flush, traZODone, zolpidem   PHYSICAL EXAM: Vital signs: Vitals:   11/06/18 2029 11/06/18 2122 11/07/18 0328 11/07/18 0434  BP: (!) 89/62   111/79  Pulse: 93 80  (!) 102  Resp: 17 18  17   Temp: 99.3 F (37.4 C)   (!) 97.4 F (36.3 C)  TempSrc: Oral   Oral  SpO2: 96% 95%  97%  Weight:   120.7 kg   Height:       Filed Weights   11/05/18 0500 11/06/18 0500 11/07/18 0328  Weight: 120.6 kg 119.5 kg 120.7 kg   Body mass index is 40.46 kg/m.   Exam  Awake Alert, Oriented X 3, No new F.N deficits, Normal affect [place].AT,PERRAL Supple Neck,No  JVD, No cervical lymphadenopathy appriciated.  Symmetrical Chest wall movement, Good air movement bilaterally, CTAB RRR,No Gallops, Rubs or new Murmurs, No Parasternal Heave +ve B.Sounds, Abd Soft, No tenderness, No organomegaly appriciated, No rebound - guarding or rigidity. No Cyanosis, Clubbing or edema, chronic left hip redness, no warmth.  This is chronic.   LABORATORY DATA: CBC: Recent Labs  Lab 11/02/18 0501  WBC 6.3  HGB 11.2*  HCT 36.0*  MCV 89.8  PLT 767    Basic Metabolic Panel: Recent Labs  Lab 11/02/18 0501  NA 136  K 4.7  CL 102  CO2 27  GLUCOSE 104*  BUN 29*  CREATININE 0.92  CALCIUM 9.3  MG 1.8    GFR: Estimated Creatinine Clearance: 91.7 mL/min (by C-G formula based on SCr of 0.92 mg/dL).  Liver Function Tests: No results for input(s): AST, ALT, ALKPHOS, BILITOT, PROT, ALBUMIN in the last 168 hours. No results for input(s): LIPASE, AMYLASE in the last 168 hours. No results for input(s): AMMONIA in the last 168 hours.  Coagulation Profile: No results for input(s): INR, PROTIME in the last 168 hours.  Cardiac Enzymes: No results for input(s): CKTOTAL, CKMB, CKMBINDEX, TROPONINI in the last 168 hours.  BNP (last 3 results) No results for input(s): PROBNP in the last 8760 hours.  HbA1C: No results for input(s): HGBA1C in the last 72 hours.  CBG: Recent Labs  Lab 10/31/18 0816 11/01/18 0804 11/02/18 0822 11/06/18 0755 11/07/18 0754  GLUCAP 83 86 83 83 82    Lipid Profile: No results for input(s): CHOL, HDL, LDLCALC, TRIG, CHOLHDL, LDLDIRECT in the last 72 hours.  Thyroid Function Tests: No results for input(s): TSH, T4TOTAL, FREET4, T3FREE, THYROIDAB in the last 72 hours.  Anemia Panel: No results for input(s): VITAMINB12, FOLATE, FERRITIN, TIBC, IRON, RETICCTPCT in the last 72 hours.  Urine analysis:    Component Value Date/Time   COLORURINE YELLOW 10/01/2018 1822   APPEARANCEUR CLEAR 10/01/2018 1822   APPEARANCEUR Clear  01/18/2018 1539   LABSPEC 1.010 10/01/2018 1822   PHURINE 5.0 10/01/2018 1822   GLUCOSEU NEGATIVE 10/01/2018 1822   HGBUR NEGATIVE 10/01/2018 1822   BILIRUBINUR NEGATIVE 10/01/2018 1822   BILIRUBINUR Negative 01/18/2018 1539   KETONESUR NEGATIVE 10/01/2018 1822   PROTEINUR NEGATIVE 10/01/2018 1822   UROBILINOGEN 0.2 09/06/2012 0213   NITRITE NEGATIVE 10/01/2018 1822   LEUKOCYTESUR NEGATIVE 10/01/2018 1822   LEUKOCYTESUR Negative 01/18/2018 1539    Sepsis Labs: Lactic Acid, Venous    Component Value Date/Time  LATICACIDVEN 1.7 10/01/2018 1833    MICROBIOLOGY:  No results found for this or any previous visit (from the past 240 hour(s)).  RADIOLOGY STUDIES/RESULTS:  Dg Knee 1-2 Views Left  Result Date: 10/16/2018 CLINICAL DATA:  Pain after fall EXAM: LEFT KNEE - 1-2 VIEW COMPARISON:  None. FINDINGS: No acute fracture or dislocation. No joint effusion. Moderate to severe degenerative changes. IMPRESSION: Degenerative changes.  No acute fracture noted. Electronically Signed   By: Dorise Bullion III M.D   On: 10/16/2018 20:32   Dg Knee 1-2 Views Right  Result Date: 10/16/2018 CLINICAL DATA:  Pain after fall EXAM: RIGHT KNEE - 1-2 VIEW COMPARISON:  None. FINDINGS: No acute fracture or effusion. Moderate to severe degenerative changes. IMPRESSION: No acute abnormality. Electronically Signed   By: Dorise Bullion III M.D   On: 10/16/2018 20:32   Dg Chest Port 1 View  Result Date: 10/16/2018 CLINICAL DATA:  PICC line placement. EXAM: PORTABLE CHEST 1 VIEW COMPARISON:  Radiographs and CT 10/07/2018. FINDINGS: 1223 hours. Left arm PICC tip is not optimally visualized, although appears to extend to the lower SVC level. The heart size and mediastinal contours are stable. There is aortic atherosclerosis. Bilateral pleural thickening and subpleural rounded atelectasis peripherally in the left lung are unchanged. There is no pneumothorax. Glenohumeral degenerative changes are present  bilaterally. IMPRESSION: PICC line tip at the lower SVC level. No pneumothorax or acute findings. Electronically Signed   By: Richardean Sale M.D.   On: 10/16/2018 13:03   Dg Abd Portable 1v  Result Date: 11/03/2018 CLINICAL DATA:  Constipation EXAM: PORTABLE ABDOMEN - 1 VIEW COMPARISON:  11/01/2018 FINDINGS: There is a non obstructive bowel gas pattern. No supine evidence of free air. No organomegaly or suspicious calcification. No acute bony abnormality. IMPRESSION: No acute findings. Electronically Signed   By: Rolm Baptise M.D.   On: 11/03/2018 11:32   Dg Abd Portable 1v  Result Date: 11/01/2018 CLINICAL DATA:  Constipation EXAM: PORTABLE ABDOMEN - 1 VIEW COMPARISON:  CT 01/09/2015 FINDINGS: Large stool burden throughout the colon. No evidence of bowel obstruction or free air. Prior cholecystectomy. Surgical sutures in the left upper abdomen. IMPRESSION: Large stool burden.  No bowel obstruction or free air. Electronically Signed   By: Rolm Baptise M.D.   On: 11/01/2018 11:03   Dg Foot 2 Views Left  Result Date: 10/17/2018 CLINICAL DATA:  Pain in left foot. EXAM: LEFT FOOT - 2 VIEW COMPARISON:  None. FINDINGS: Osteopenia. No fractures in the toes or metacarpals. Enthesopathic change at the posterior calcaneus. There is a lucency along the inferior aspect of the calcaneus which does not appear to extend through the calcaneus and is relatively well corticated. No other bony abnormalities. IMPRESSION: 1. There is a lucency along the inferior aspect of the calcaneus which is relatively well corticated and does not appear to extend through the calcaneus. This is favored to be nonacute. Recommend clinical correlation to exclude pain in this region. Electronically Signed   By: Dorise Bullion III M.D   On: 10/17/2018 15:05   Dg Fluoro Guided Needle Plc Aspiration/injection Loc  Result Date: 10/15/2018 CLINICAL DATA:  Left hip pain, evaluate for fluid collection EXAM: LEFT HIP ASPIRATION UNDER FLUOROSCOPY  FLUOROSCOPY TIME:  Fluoroscopy Time:  18 seconds Radiation Exposure Index (if provided by the fluoroscopic device): 2.3 mGy Number of Acquired Spot Images: 1 PROCEDURE: Overlying skin prepped with Betadine, draped in the usual sterile fashion, and infiltrated locally with buffered Lidocaine. 20 gauge spinal needle  advanced to the prosthetic junction of the femoral head/neck. 1 ml of Lidocaine injected easily. No fluid/frank infection could be aspirated. 4 mL Lidocaine and 6 mL sterile saline administered into the joint with attempted subsequent aspiration. Less than 1 mL fluid was aspirated and sent for laboratory evaluation. No immediate complication. IMPRESSION: Technically successful left hip aspiration under fluoroscopy, as described above. Electronically Signed   By: Julian Hy M.D.   On: 10/15/2018 11:18   Dg Hips Bilat With Pelvis 2v  Result Date: 10/16/2018 CLINICAL DATA:  Fall.  Pain. EXAM: DG HIP (WITH OR WITHOUT PELVIS) 2V BILAT COMPARISON:  None. FINDINGS: The patient is status post bilateral hip replacements. Visualized hardware is in good position. No acute fractures. IMPRESSION: Bilateral hip replacements.  No acute fractures. Electronically Signed   By: Dorise Bullion III M.D   On: 10/16/2018 20:30   [place] Ekg Site Rite  Result Date: 10/15/2018 If Site Rite image not attached, placement could not be confirmed due to current cardiac rhythm.    LOS: 34 days   Signature  Lala Lund M.D on 11/07/2018 at 9:06 AM   -  To page go to [email or web address]

## 2018-11-08 LAB — GLUCOSE, CAPILLARY: Glucose-Capillary: 96 mg/dL (ref 70–99)

## 2018-11-08 LAB — MAGNESIUM: Magnesium: 2.1 mg/dL (ref 1.7–2.4)

## 2018-11-08 LAB — CBC
HCT: 35.6 % — ABNORMAL LOW (ref 39.0–52.0)
Hemoglobin: 11 g/dL — ABNORMAL LOW (ref 13.0–17.0)
MCH: 28 pg (ref 26.0–34.0)
MCHC: 30.9 g/dL (ref 30.0–36.0)
MCV: 90.6 fL (ref 80.0–100.0)
Platelets: 247 10*3/uL (ref 150–400)
RBC: 3.93 MIL/uL — ABNORMAL LOW (ref 4.22–5.81)
RDW: 16.9 % — ABNORMAL HIGH (ref 11.5–15.5)
WBC: 7.2 10*3/uL (ref 4.0–10.5)
nRBC: 0 % (ref 0.0–0.2)

## 2018-11-08 LAB — BASIC METABOLIC PANEL
Anion gap: 11 (ref 5–15)
BUN: 38 mg/dL — ABNORMAL HIGH (ref 8–23)
CO2: 25 mmol/L (ref 22–32)
Calcium: 10 mg/dL (ref 8.9–10.3)
Chloride: 101 mmol/L (ref 98–111)
Creatinine, Ser: 1.05 mg/dL (ref 0.61–1.24)
GFR calc Af Amer: >60 mL/min (ref >60)
GFR calc non Af Amer: >60 mL/min (ref >60)
Glucose, Bld: 98 mg/dL (ref 70–99)
Potassium: 4.2 mmol/L (ref 3.5–5.1)
Sodium: 137 mmol/L (ref 135–145)

## 2018-11-08 MED ORDER — METHYLNALTREXONE BROMIDE 12 MG/0.6ML ~~LOC~~ SOLN
12.0000 mg | SUBCUTANEOUS | Status: AC
  Administered 2018-11-08 – 2018-11-12 (×3): 12 mg via SUBCUTANEOUS
  Filled 2018-11-08 (×3): qty 0.6

## 2018-11-08 MED ORDER — LACTATED RINGERS IV SOLN: INTRAVENOUS | Status: AC

## 2018-11-08 MED ORDER — HYDROXYZINE HCL 25 MG PO TABS
25.0000 mg | ORAL_TABLET | Freq: Once | ORAL | Status: AC
  Administered 2018-11-08: 25 mg via ORAL
  Filled 2018-11-08: qty 1

## 2018-11-08 MED ORDER — DILTIAZEM HCL ER COATED BEADS 120 MG PO CP24
120.0000 mg | ORAL_CAPSULE | Freq: Every day | ORAL | Status: DC
  Administered 2018-11-08 – 2018-12-07 (×25): 120 mg via ORAL
  Filled 2018-11-08 (×31): qty 1

## 2018-11-08 MED ORDER — OXYCODONE HCL 5 MG PO TABS
5.0000 mg | ORAL_TABLET | Freq: Four times a day (QID) | ORAL | Status: DC | PRN
  Administered 2018-11-08 – 2018-11-18 (×15): 5 mg via ORAL
  Filled 2018-11-08 (×17): qty 1

## 2018-11-08 NOTE — Progress Notes (Addendum)
PROGRESS NOTE        PATIENT DETAILS Name: [name] Age: 73 y.o. Sex: male Date of Birth: 1945/11/25 Admit Date: 10/01/2018 Admitting Physician Vianne Bulls, MD [id number], Hollice Espy, MD  Brief Narrative: Patient is a 73 y.o. male with history of A. 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  Found to have a soft tissue infection of the left hip area, acute kidney injury and hyperkalemia.  ID, orthopedics consulted-underwent left hip aspiration on 2/14, infectious disease recommending 6 weeks of IV Invanz.   See below for further details.   Patient is medically stable await bed for placement.   Subjective:  Patient in bed, appears comfortable, denies any headache, no fever, no chest pain or pressure, no shortness of breath , no abdominal pain. No focal weakness.   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  Was on suppressive Flagyl for Bacteroides fragilis infection-recommendations from ID are for 6 weeks of IV ertapenem-end date of November 25, 2018. Post DC follow up with primary orthopedic surgeon Dr. Ricki Rodriguez and ID physician at [facility].  AKI: Likely hemodynamically mediated-secondary to soft tissue infection of the left hip, diuretic use.  Resolved with supportive care.  Follow electrolytes periodically.  History of left total hip  arthroplasty with subsequent infection with Bacteroides fragilis: Patient was evaluated by infectious disease at [facility] was previously on Flagyl.  Now on IV Invanz-Flagyl has been discontinued.  Recurrent pleural effusion: Felt to be secondary to hepatic hydrothorax-most recent CT chest on 10/07/2018 shows a small pleural effusion-patient is asymptomatic at this point.  Doubt further work-up required-furthermore he has had a recent extensive work-up including multiple thoracocentesis   Cryptogenic cirrhosis: Prior hepatitis serology done earlier this month was negative-agree with plans outlined in prior discharge summary for outpatient follow-up.  Volume status is stable-he is awake and alert-continue Aldactone  Chronic atrial fibrillation [place] vas 2 score of at least 3: Rate controlled with Cardizem 1 dose reduced on 11/08/2018 due to borderline low blood pressures, will also give IV fluid bolus on 11/08/2018.Continue Eliquis.  Chronic back and right shoulder pain: Continue Lidoderm patch, Tylenol and as needed narcotics.  Will add NSAID cream to the right shoulder twice daily for better control of his discomfort.  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  Do not see any need for that in fact he has severe narcotic bowel and frequently gets constipated for which I have reduced his narcotics.  Bipolar disorder: Stable-continue Lexapro, Neurontin  Constipation: Narcotic bowel, improved with cutting down narcotics and placing on strict bowel regimen, he frequently requests disimpaction without any evidence of rectal impaction on x-ray, this was told to me by several nurses.  He will get 3 doses of Relistor  starting 11/08/2018.  Right shoulder pain: Claims he has severe arthritis-and has required cortisone shots in the past-spoke with Orion Crook, PA-C with orthopedics on 2/26, orthopedic service does not do inpatient cortisone shots.  Lidoderm patch continued we added NSAID cream on 11/05/2018 with much relief.  OSA: Continue CPAP nightly  Morbid obesity  Acute on chronic debility/deconditioning: Has chronic debility/deconditioning at baseline-this is worsened due to acute illness/AKI/left hip infection.  Per social work-very difficult to place to SNF.    DVT Prophylaxis: Full dose anticoagulation with Eliquis  Code Status: Full code  Family Communication: None at bedside  Disposition Plan: Remain inpatient- SNF on discharge when bed available  Antimicrobial agents: Anti-infectives (From admission, onward)   Start     Dose/Rate Route Frequency Ordered Stop   10/15/18 1500  cefTRIAXone (ROCEPHIN) 2 g in sodium chloride 0.9 % 100 mL IVPB  Status:  Discontinued     2 g 200 mL/hr over 30 Minutes Intravenous Every 24 hours 10/15/18 1434 10/15/18 1447   10/15/18 1500  ertapenem (INVANZ) 1,000 mg in sodium chloride 0.9 % 100 mL IVPB     1 g 200 mL/hr over 30 Minutes Intravenous Every 24 hours 10/15/18 1447 11/25/18 2359   10/08/18 0000  doxycycline (VIBRA-TABS) 100 MG tablet     100 mg Oral Every 12 hours 10/08/18 0921     10/06/18 1000  doxycycline (VIBRA-TABS) tablet 100 mg  Status:  Discontinued     100 mg Oral Every 12 hours 10/06/18 0726 10/11/18 1328   10/03/18 1800  metroNIDAZOLE (FLAGYL) tablet 500 mg  Status:  Discontinued     500 mg Oral Every 8 hours 10/03/18 1054 10/15/18 1447   10/03/18 0700  vancomycin (VANCOCIN) 2,000 mg in sodium chloride 0.9 % 500 mL IVPB  Status:  Discontinued     2,000 mg 250 mL/hr over 120 Minutes Intravenous Every 36 hours 10/01/18 1942 10/06/18 0726   10/02/18 0200  ceFEPIme (MAXIPIME) 2 g in sodium chloride 0.9 % 100 mL IVPB  Status:  Discontinued     2 g 200 mL/hr over 30 Minutes Intravenous Every 8 hours 10/01/18 1942 10/02/18 1008   10/01/18 1830  vancomycin (VANCOCIN)  2,500 mg in sodium chloride 0.9 % 500 mL IVPB     2,500 mg 250 mL/hr over 120 Minutes Intravenous STAT 10/01/18 1825 10/02/18 0126   10/01/18 1800  ceFEPIme (MAXIPIME) 2 g in sodium chloride 0.9 % 100 mL IVPB     2 g 200 mL/hr over 30 Minutes Intravenous  Once 10/01/18 1747 10/01/18 1903   10/01/18 1800  metroNIDAZOLE (FLAGYL) IVPB 500 mg  Status:  Discontinued     500 mg 100 mL/hr over 60 Minutes Intravenous Every 8 hours 10/01/18 1747 10/03/18 1054   10/01/18 1800  vancomycin (VANCOCIN) IVPB 1000 mg/200 mL premix  Status:  Discontinued     1,000 mg 200 mL/hr over 60 Minutes Intravenous  Once 10/01/18 1747 10/01/18 1825      Procedures: None  CONSULTS:  None  Time spent: 15- minutes-Greater than 50% of this time was spent in counseling, explanation of diagnosis, planning of further management, and coordination of care.  MEDICATIONS: Scheduled Meds: . apixaban  5 mg Oral BID  . bisacodyl  10 mg Oral Daily  . cycloSPORINE  1 drop Both Eyes BID  . diclofenac sodium  2 g Topical BID  . diltiazem  120 mg Oral Daily  . escitalopram  10 mg Oral Daily  . gabapentin  100 mg Oral TID  . lidocaine  1 patch Transdermal Q24H  . Melatonin  6 mg Oral QHS  . midodrine  10 mg Oral TID WC  . multivitamin with minerals  1 tablet Oral Daily  . pantoprazole  40 mg Oral Daily  . Ensure Max Protein  11 oz Oral BID  . spironolactone  50 mg Oral Daily  . thiamine  100 mg Oral Daily   Continuous Infusions: . sodium chloride 250 mL (10/24/18 0052)  . ertapenem 1,000 mg (11/07/18 1609)  . lactated ringers     PRN Meds:.sodium chloride, acetaminophen, albuterol, alum & mag hydroxide-simeth, diltiazem, menthol-cetylpyridinium, [DISCONTINUED] ondansetron **OR** ondansetron (ZOFRAN) IV, oxyCODONE, sodium chloride flush, traZODone, zolpidem   PHYSICAL EXAM: Vital signs: Vitals:   11/07/18 1545 11/07/18 1950 11/08/18 0300 11/08/18 0535  BP: 104/65 106/73  (!) 81/55  Pulse: 83 85  (!) 107    Resp: 18 16  16   Temp: 97.8 F (36.6 C) 98.6 F (37 C)  97.7 F (36.5 C)  TempSrc: Oral Oral  Oral  SpO2: 96% 98%  97%  Weight:   120.3 kg   Height:       Filed Weights   11/06/18 0500 11/07/18 0328 11/08/18 0300  Weight: 119.5 kg 120.7 kg 120.3 kg   Body mass index is 40.33 kg/m.   Exam  Awake Alert, Oriented X 3, No new F.N deficits, Normal affect [place].AT,PERRAL Supple Neck,No JVD, No cervical lymphadenopathy appriciated.  Symmetrical Chest wall movement, Good air movement bilaterally, CTAB RRR,No Gallops, Rubs or new Murmurs, No Parasternal Heave +ve B.Sounds, Abd Soft, No tenderness, No organomegaly appriciated, No rebound - guarding or rigidity. No Cyanosis, Clubbing or edema,  chronic left hip redness, no warmth.  This is chronic.   LABORATORY DATA: CBC: Recent Labs  Lab 11/02/18 0501 11/08/18 0349  WBC 6.3 7.2  HGB 11.2* 11.0*  HCT 36.0* 35.6*  MCV 89.8 90.6  PLT 272 962    Basic Metabolic Panel: Recent Labs  Lab 11/02/18 0501 11/08/18 0349  NA 136 137  K 4.7 4.2  CL 102 101  CO2 27 25  GLUCOSE 104* 98  BUN 29* 38*  CREATININE 0.92 1.05  CALCIUM 9.3 10.0  MG 1.8 2.1    GFR: Estimated Creatinine Clearance: 80.2 mL/min (by C-G formula based on SCr of 1.05 mg/dL).  Liver Function Tests: No results for input(s): AST, ALT, ALKPHOS, BILITOT, PROT, ALBUMIN in the last 168 hours. No results for input(s): LIPASE, AMYLASE in the last 168 hours. No results for input(s): AMMONIA in the last 168 hours.  Coagulation Profile: No results for input(s): INR, PROTIME in the last 168 hours.  Cardiac Enzymes: No results for input(s): CKTOTAL, CKMB, CKMBINDEX, TROPONINI in the last 168 hours.  BNP (last 3 results) No results for input(s): PROBNP in the last 8760 hours.  HbA1C: No results for input(s): HGBA1C in the last 72 hours.  CBG: Recent Labs  Lab 11/02/18 0822 11/06/18 0755 11/07/18 0754 11/07/18 1953 11/08/18 0743  GLUCAP 83 83 82 126* 96     Lipid Profile: No results for input(s): CHOL, HDL, LDLCALC, TRIG, CHOLHDL, LDLDIRECT in the last 72 hours.  Thyroid Function Tests: No results for input(s): TSH, T4TOTAL, FREET4, T3FREE, THYROIDAB in the last 72 hours.  Anemia Panel: No results for input(s): VITAMINB12, FOLATE, FERRITIN, TIBC, IRON, RETICCTPCT in the last 72 hours.  Urine analysis:    Component Value Date/Time   COLORURINE YELLOW 10/01/2018 1822   APPEARANCEUR CLEAR 10/01/2018  [name] 01/18/2018 1539   LABSPEC 1.010 10/01/2018 1822   PHURINE 5.0 10/01/2018 1822   GLUCOSEU NEGATIVE 10/01/2018 1822   HGBUR NEGATIVE 10/01/2018 1822   BILIRUBINUR NEGATIVE 10/01/2018 1822   BILIRUBINUR Negative 01/18/2018 [place] 10/01/2018 1822   PROTEINUR NEGATIVE 10/01/2018 1822   UROBILINOGEN 0.2 09/06/2012 0213   NITRITE NEGATIVE 10/01/2018 1822   LEUKOCYTESUR NEGATIVE 10/01/2018 1822   LEUKOCYTESUR Negative 01/18/2018 1539    Sepsis Labs: Lactic Acid, Venous    Component Value Date/Time   LATICACIDVEN 1.7 10/01/2018 1833    MICROBIOLOGY:  No results found for this or any previous visit (from the past 240 hour(s)).  RADIOLOGY STUDIES/RESULTS:  Dg Knee 1-2 Views Left  Result Date: 10/16/2018 CLINICAL DATA:  Pain after fall EXAM: LEFT KNEE - 1-2 VIEW COMPARISON:  None. FINDINGS: No acute fracture or dislocation. No joint effusion. Moderate to severe degenerative changes. IMPRESSION: Degenerative changes.  No acute fracture noted. Electronically Signed   By: Dorise Bullion III M.D   On: 10/16/2018 20:32   Dg Knee 1-2 Views Right  Result Date: 10/16/2018 CLINICAL DATA:  Pain after fall EXAM: RIGHT KNEE - 1-2 VIEW COMPARISON:  None. FINDINGS: No acute fracture or effusion. Moderate to severe degenerative changes. IMPRESSION: No acute abnormality. Electronically Signed   By: Dorise Bullion III M.D   On: 10/16/2018 20:32   Dg Chest Port 1 View  Result Date: 10/16/2018 CLINICAL  DATA:  PICC line placement. EXAM: PORTABLE CHEST 1 VIEW COMPARISON:  Radiographs and CT 10/07/2018. FINDINGS: 1223 hours. Left arm PICC tip is not optimally visualized, although appears to extend to the lower SVC level. The heart size and mediastinal contours are stable. There is aortic atherosclerosis. Bilateral pleural thickening and subpleural rounded atelectasis peripherally in the left lung are unchanged. There is no pneumothorax. Glenohumeral degenerative changes are present bilaterally. IMPRESSION: PICC line tip at the lower SVC level. No pneumothorax or acute findings. Electronically Signed   By: Richardean Sale M.D.   On: 10/16/2018 13:03   Dg Abd Portable 1v  Result Date: 11/03/2018 CLINICAL DATA:  Constipation EXAM: PORTABLE ABDOMEN - 1 VIEW COMPARISON:  11/01/2018 FINDINGS: There is a non obstructive bowel gas pattern. No supine evidence of free air. No organomegaly or suspicious calcification. No acute bony abnormality. IMPRESSION: No acute findings. Electronically Signed   By: Rolm Baptise M.D.   On: 11/03/2018 11:32   Dg Abd Portable 1v  Result Date: 11/01/2018 CLINICAL DATA:  Constipation EXAM: PORTABLE ABDOMEN - 1 VIEW COMPARISON:  CT 01/09/2015 FINDINGS: Large stool burden throughout the colon. No evidence of bowel obstruction or free air. Prior cholecystectomy. Surgical sutures in the left upper abdomen. IMPRESSION: Large stool burden.  No bowel obstruction or free air. Electronically Signed   By: Rolm Baptise M.D.   On: 11/01/2018 11:03   Dg Foot 2 Views Left  Result Date: 10/17/2018 CLINICAL DATA:  Pain in left foot. EXAM: LEFT FOOT - 2 VIEW COMPARISON:  None. FINDINGS: Osteopenia. No fractures in the toes or metacarpals. Enthesopathic change at the posterior calcaneus. There is a lucency along the inferior aspect of the calcaneus which does not appear to extend through the calcaneus and is relatively well corticated. No other bony abnormalities. IMPRESSION: 1. There is a lucency along  the inferior aspect of the calcaneus which is relatively well corticated and does not appear to extend through the calcaneus. This is favored to be nonacute. Recommend clinical correlation to exclude  pain in this region. Electronically Signed   By: Dorise Bullion III M.D   On: 10/17/2018 15:05   Dg Fluoro Guided Needle Plc Aspiration/injection Loc  Result Date: 10/15/2018 CLINICAL DATA:  Left hip pain, evaluate for fluid collection EXAM: LEFT HIP ASPIRATION UNDER FLUOROSCOPY FLUOROSCOPY TIME:  Fluoroscopy Time:  18 seconds Radiation Exposure Index (if provided by the fluoroscopic device): 2.3 mGy Number of Acquired Spot Images: 1 PROCEDURE: Overlying skin prepped with Betadine, draped in the usual sterile fashion, and infiltrated locally with buffered Lidocaine. 20 gauge spinal needle advanced to the prosthetic junction of the femoral head/neck. 1 ml of Lidocaine injected easily. No fluid/frank infection could be aspirated. 4 mL Lidocaine and 6 mL sterile saline administered into the joint with attempted subsequent aspiration. Less than 1 mL fluid was aspirated and sent for laboratory evaluation. No immediate complication. IMPRESSION: Technically successful left hip aspiration under fluoroscopy, as described above. Electronically Signed   By: Julian Hy M.D.   On: 10/15/2018 11:18   Dg Hips Bilat With Pelvis 2v  Result Date: 10/16/2018 CLINICAL DATA:  Fall.  Pain. EXAM: DG HIP (WITH OR WITHOUT PELVIS) 2V BILAT COMPARISON:  None. FINDINGS: The patient is status post bilateral hip replacements. Visualized hardware is in good position. No acute fractures. IMPRESSION: Bilateral hip replacements.  No acute fractures. Electronically Signed   By: Dorise Bullion III M.D   On: 10/16/2018 20:30   [place] Ekg Site Rite  Result Date: 10/15/2018 If Site Rite image not attached, placement could not be confirmed due to current cardiac rhythm.    LOS: 35 days   Signature  Lala Lund M.D on 11/08/2018 at  8:26 AM   -  To page go to [email or web address]

## 2018-11-08 NOTE — Progress Notes (Signed)
Patient states no assistance with home CPAP at this time is needed, no distress noted at this time.

## 2018-11-08 NOTE — Progress Notes (Signed)
Patient remains on Difficult to Place waitlist.   Cedric Fishman LCSW [phone]

## 2018-11-09 LAB — GLUCOSE, CAPILLARY: Glucose-Capillary: 86 mg/dL (ref 70–99)

## 2018-11-09 NOTE — Progress Notes (Signed)
Physical Therapy Treatment Patient Details Name: [name] MRN: [id number] DOB: March 04, 1946 Today's Date: 11/09/2018    History of Present Illness Pt is a 73 y.o. male admitted 10/01/18 with generalized weakness, falls and SOB. Workup revealed afib with recurrent R-side pleural effusion; AKI. Pt with L hip cellulitis with chronic L hip prosthetic joint infection; s/p joint aspiration 2/14. PMH includes HTN, afib on Eliquis, OSA, bipolar disorder, obesity, L THA with infection and multiple prolonged hospitalizations.    PT Comments    Pt is up to side of bed, and with help was finally able to put wgt through his legs and attempt standing on BLE's.  He is motivated and asked about going directly home, but spoke with him about the limitations he still experiences and how challenging a transition there would be, not to mention limiting for recovery.  Pt is progressed to standing on his legs, and will continue to encourage him to use UE's and legs to push off sitting posture, progress to standing on RW.   Follow Up Recommendations  SNF     Equipment Recommendations  None recommended by PT    Recommendations for Other Services       Precautions / Restrictions Precautions Precautions: Fall Restrictions Weight Bearing Restrictions: No    Mobility  Bed Mobility Overal bed mobility: Needs Assistance Bed Mobility: Supine to Sit;Sit to Supine Rolling: Mod assist   Supine to sit: Mod assist;HOB elevated Sit to supine: Mod assist;HOB elevated   General bed mobility comments: mod assist for trunk OOB and mod for legs back to bed  Transfers Overall transfer level: Needs assistance Equipment used: Rolling walker (2 wheeled);1 person hand held assist Transfers: Sit to/from Stand Sit to Stand: Max assist         General transfer comment: attempted with RW then PT added gait belt and held directly onto pt.  he is able to clear hips with cues and forward leaning side of  bed  Ambulation/Gait             General Gait Details: unable   Stairs             Wheelchair Mobility    Modified Rankin (Stroke Patients Only)       Balance Overall balance assessment: Needs assistance Sitting-balance support: Feet supported;Bilateral upper extremity supported Sitting balance-[name]: Fair Sitting balance - Comments: cues for safety in maintaining posture forward                                    Cognition Arousal/Alertness: Awake/alert Behavior During Therapy: Anxious Overall Cognitive Status: Within Functional Limits for tasks assessed                                 General Comments: fearful of moving on the bed      Exercises      General Comments General comments (skin integrity, edema, etc.): pt is sensitive on his skin but also sore R shoulder with any movement      Pertinent Vitals/Pain Pain Assessment: Faces Faces Pain Scale: Hurts little more Pain Location: R shoulder Pain Descriptors / Indicators: Guarding Pain Intervention(s): Monitored during session;Premedicated before session;Repositioned    Home Living                      Prior Function  PT Goals (current goals can now be found in the care plan section) Acute Rehab PT Goals Patient Stated Goal: to get stronger and get home Progress towards PT goals: Progressing toward goals    Frequency    Min 2X/week      PT Plan Current plan remains appropriate    Co-evaluation              AM-PAC PT "6 Clicks" Mobility   Outcome Measure  Help needed turning from your back to your side while in a flat bed without using bedrails?: A Lot Help needed moving from lying on your back to sitting on the side of a flat bed without using bedrails?: A Lot Help needed moving to and from a bed to a chair (including a wheelchair)?: A Lot Help needed standing up from a chair using your arms (e.g., wheelchair or  bedside chair)?: Total Help needed to walk in hospital room?: Total Help needed climbing 3-5 steps with a railing? : Total 6 Click Score: 9    End of Session Equipment Utilized During Treatment: Gait belt Activity Tolerance: Patient tolerated treatment well;Patient limited by fatigue Patient left: in bed;with call bell/phone within reach;with bed alarm set Nurse Communication: Mobility status PT Visit Diagnosis: Other abnormalities of gait and mobility (R26.89);Muscle weakness (generalized) (M62.81);Difficulty in walking, not elsewhere classified (R26.2);History of falling (Z91.81)     Time: 9528-4132 PT Time Calculation (min) (ACUTE ONLY): 32 min  Charges:  $Therapeutic Activity: 8-22 mins $Neuromuscular Re-education: 8-22 mins                    Ramond Dial 11/09/2018, 2:47 PM  Mee Hives, PT MS Acute Rehab Dept. Number: [place]

## 2018-11-09 NOTE — Progress Notes (Signed)
RT added sterile water to patients home CPAP machine at bedside.  RT assistance not needed at this time.

## 2018-11-09 NOTE — Progress Notes (Signed)
PROGRESS NOTE        PATIENT DETAILS Name: [name] Age: 73 y.o. Sex: male Date of Birth: 1945-12-23 Admit Date: 10/01/2018 Admitting Physician Vianne Bulls, MD [id number], Hollice Espy, MD  Brief Narrative: Patient is a 73 y.o. male with history of A. 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  Found to have a soft tissue infection of the left hip area, acute kidney injury and hyperkalemia.  ID, orthopedics consulted-underwent left hip aspiration on 2/14, infectious disease recommending 6 weeks of IV Invanz.   See below for further details.   Patient is medically stable await bed for placement.   Subjective:  Patient in bed, appears comfortable, denies any headache, no fever, no chest pain or pressure, no shortness of breath , no abdominal pain. No focal weakness.  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  Was on suppressive Flagyl for Bacteroides fragilis infection-recommendations from ID are for 6 weeks of IV ertapenem-end date of November 25, 2018. Post DC follow up with primary orthopedic surgeon Dr. Ricki Rodriguez and ID physician at [facility]  AKI: Likely hemodynamically mediated-secondary to soft tissue infection of the left hip, diuretic use.  Resolved with supportive care.  Follow electrolytes periodically.  History of left total hip arthroplasty  with subsequent infection with Bacteroides fragilis: as #1  Recurrent pleural effusion: Felt to be secondary to hepatic hydrothorax-most recent CT chest on 10/07/2018 shows a small pleural effusion-patient is asymptomatic at this point.  Doubt further work-up required-furthermore he has had a recent extensive work-up including multiple thoracocentesis   Cryptogenic cirrhosis: Prior hepatitis serology done earlier this month was negative-agree with plans outlined in prior discharge summary for outpatient follow-up.  Volume status is stable-he is awake and alert-continue Aldactone  Chronic atrial fibrillation [place] vas 2 score of at least 3: Rate controlled with Cardizem 1 dose reduced on 11/08/2018 due to borderline low blood pressures.Continue Eliquis.  Chronic back and right shoulder pain: Continue Lidoderm patch, Tylenol and as needed narcotics.  Will add NSAID cream to the right shoulder twice daily for better control of his discomfort.  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  Do not see any need for that in fact he has severe narcotic bowel and frequently gets constipated for which I have reduced his narcotics.  Bipolar disorder: Stable-continue Lexapro, Neurontin  Constipation: Narcotic bowel, improved with cutting down narcotics and placing on strict bowel regimen, he frequently requests disimpaction without any evidence of rectal impaction on x-ray, this was told to me by several nurses.  He will get 3 doses of Relistor starting 11/08/2018.  Right shoulder pain: Claims he has severe arthritis-and has required cortisone shots in the past-spoke with Orion Crook, PA-C with orthopedics on 2/26, orthopedic service does not  do inpatient cortisone shots.  Lidoderm patch continued we added NSAID cream on 11/05/2018 with much relief.  OSA: Continue CPAP  nightly  Morbid obesity  Acute on chronic debility/deconditioning: Has chronic debility/deconditioning at baseline-this is worsened due to acute illness/AKI/left hip infection.  Per social work-very difficult to place to SNF.    DVT Prophylaxis: Full dose anticoagulation with Eliquis  Code Status: Full code  Family Communication: None at bedside  Disposition Plan: Remain inpatient- SNF on discharge when bed available  Antimicrobial agents: Anti-infectives (From admission, onward)   Start     Dose/Rate Route Frequency Ordered Stop   10/15/18 1500  cefTRIAXone (ROCEPHIN) 2 g in sodium chloride 0.9 % 100 mL IVPB  Status:  Discontinued     2 g 200 mL/hr over 30 Minutes Intravenous Every 24 hours 10/15/18 1434 10/15/18 1447   10/15/18 1500  ertapenem (INVANZ) 1,000 mg in sodium chloride 0.9 % 100 mL IVPB     1 g 200 mL/hr over 30 Minutes Intravenous Every 24 hours 10/15/18 1447 11/25/18 2359   10/08/18 0000  doxycycline (VIBRA-TABS) 100 MG tablet     100 mg Oral Every 12 hours 10/08/18 0921     10/06/18 1000  doxycycline (VIBRA-TABS) tablet 100 mg  Status:  Discontinued     100 mg Oral Every 12 hours 10/06/18 0726 10/11/18 1328   10/03/18 1800  metroNIDAZOLE (FLAGYL) tablet 500 mg  Status:  Discontinued     500 mg Oral Every 8 hours 10/03/18 1054 10/15/18 1447   10/03/18 0700  vancomycin (VANCOCIN) 2,000 mg in sodium chloride 0.9 % 500 mL IVPB  Status:  Discontinued     2,000 mg 250 mL/hr over 120 Minutes Intravenous Every 36 hours 10/01/18 1942 10/06/18 0726   10/02/18 0200  ceFEPIme (MAXIPIME) 2 g in sodium chloride 0.9 % 100 mL IVPB  Status:  Discontinued     2 g 200 mL/hr over 30 Minutes Intravenous Every 8 hours 10/01/18 1942 10/02/18 1008   10/01/18 1830  vancomycin (VANCOCIN) 2,500 mg in sodium chloride 0.9 % 500 mL IVPB     2,500 mg 250 mL/hr over 120 Minutes Intravenous STAT 10/01/18 1825 10/02/18 0126   10/01/18 1800  ceFEPIme (MAXIPIME) 2 g in sodium chloride 0.9 %  100 mL IVPB     2 g 200 mL/hr over 30 Minutes Intravenous  Once 10/01/18 1747 10/01/18 1903   10/01/18 1800  metroNIDAZOLE (FLAGYL) IVPB 500 mg  Status:  Discontinued     500 mg 100 mL/hr over 60 Minutes Intravenous Every 8 hours 10/01/18 1747 10/03/18 1054   10/01/18 1800  vancomycin (VANCOCIN) IVPB 1000 mg/200 mL premix  Status:  Discontinued     1,000 mg 200 mL/hr over 60 Minutes Intravenous  Once 10/01/18 1747 10/01/18 1825      Procedures: None  CONSULTS:  None  Time spent: 15- minutes-Greater than 50% of this time was spent in counseling, explanation of diagnosis, planning of further management, and coordination of care.  MEDICATIONS: Scheduled Meds: . apixaban  5 mg Oral BID  . cycloSPORINE  1 drop Both Eyes BID  . diclofenac sodium  2 g Topical BID  . diltiazem  120 mg Oral Daily  . escitalopram  10 mg Oral Daily  . gabapentin  100 mg Oral TID  . lidocaine  1 patch Transdermal Q24H  . Melatonin  6 mg Oral QHS  . methylnaltrexone  12 mg Subcutaneous QODAY  . midodrine  10 mg Oral TID WC  .  multivitamin with minerals  1 tablet Oral Daily  . pantoprazole  40 mg Oral Daily  . Ensure Max Protein  11 oz Oral BID  . spironolactone  50 mg Oral Daily  . thiamine  100 mg Oral Daily   Continuous Infusions: . sodium chloride 250 mL (10/24/18 0052)  . ertapenem 1,000 mg (11/08/18 1617)   PRN Meds:.sodium chloride, acetaminophen, albuterol, alum & mag hydroxide-simeth, diltiazem, menthol-cetylpyridinium, [DISCONTINUED] ondansetron **OR** ondansetron (ZOFRAN) IV, oxyCODONE, sodium chloride flush, traZODone, zolpidem   PHYSICAL EXAM: Vital signs: Vitals:   11/08/18 1341 11/08/18 2146 11/09/18 0500 11/09/18 0520  BP: 106/72 104/75  112/70  Pulse: 90 86  90  Resp: 20 18    Temp: 97.6 F (36.4 C) 98.1 F (36.7 C)  (!) 97.4 F (36.3 C)  TempSrc: Oral Oral  Oral  SpO2: 99% 97%  95%  Weight:   116.3 kg   Height:       Filed Weights   11/07/18 0328 11/08/18 0300  11/09/18 0500  Weight: 120.7 kg 120.3 kg 116.3 kg   Body mass index is 38.98 kg/m.   Exam  Awake Alert, Oriented X 3, No new F.N deficits, Normal affect [place].AT,PERRAL Supple Neck,No JVD, No cervical lymphadenopathy appriciated.  Symmetrical Chest wall movement, Good air movement bilaterally, CTAB RRR,No Gallops, Rubs or new Murmurs, No Parasternal Heave +ve B.Sounds, Abd Soft, No tenderness, No organomegaly appriciated, No rebound - guarding or rigidity. No Cyanosis, Clubbing or edema,  chronic left hip redness, no warmth.  This is chronic.   LABORATORY DATA: CBC: Recent Labs  Lab 11/08/18 0349  WBC 7.2  HGB 11.0*  HCT 35.6*  MCV 90.6  PLT 962    Basic Metabolic Panel: Recent Labs  Lab 11/08/18 0349  NA 137  K 4.2  CL 101  CO2 25  GLUCOSE 98  BUN 38*  CREATININE 1.05  CALCIUM 10.0  MG 2.1    GFR: Estimated Creatinine Clearance: 78.8 mL/min (by C-G formula based on SCr of 1.05 mg/dL).  Liver Function Tests: No results for input(s): AST, ALT, ALKPHOS, BILITOT, PROT, ALBUMIN in the last 168 hours. No results for input(s): LIPASE, AMYLASE in the last 168 hours. No results for input(s): AMMONIA in the last 168 hours.  Coagulation Profile: No results for input(s): INR, PROTIME in the last 168 hours.  Cardiac Enzymes: No results for input(s): CKTOTAL, CKMB, CKMBINDEX, TROPONINI in the last 168 hours.  BNP (last 3 results) No results for input(s): PROBNP in the last 8760 hours.  HbA1C: No results for input(s): HGBA1C in the last 72 hours.  CBG: Recent Labs  Lab 11/06/18 0755 11/07/18 0754 11/07/18 1953 11/08/18 0743 11/09/18 0811  GLUCAP 83 82 126* 96 86    Lipid Profile: No results for input(s): CHOL, HDL, LDLCALC, TRIG, CHOLHDL, LDLDIRECT in the last 72 hours.  Thyroid Function Tests: No results for input(s): TSH, T4TOTAL, FREET4, T3FREE, THYROIDAB in the last 72 hours.  Anemia Panel: No results for input(s): VITAMINB12, FOLATE, FERRITIN,  TIBC, IRON, RETICCTPCT in the last 72 hours.  Urine analysis:    Component Value Date/Time   COLORURINE YELLOW 10/01/2018 1822   APPEARANCEUR CLEAR 10/01/2018 1822   APPEARANCEUR Clear 01/18/2018 1539   LABSPEC 1.010 10/01/2018 1822   PHURINE 5.0 10/01/2018 1822   GLUCOSEU NEGATIVE 10/01/2018 1822   HGBUR NEGATIVE 10/01/2018 1822   BILIRUBINUR NEGATIVE 10/01/2018 1822   BILIRUBINUR Negative 01/18/2018 [place] 10/01/2018 1822   PROTEINUR NEGATIVE 10/01/2018 1822  UROBILINOGEN 0.2 09/06/2012 0213   NITRITE NEGATIVE 10/01/2018 1822   LEUKOCYTESUR NEGATIVE 10/01/2018 1822   LEUKOCYTESUR Negative 01/18/2018 1539    Sepsis Labs: Lactic Acid, Venous    Component Value Date/Time   LATICACIDVEN 1.7 10/01/2018 1833    MICROBIOLOGY:  No results found for this or any previous visit (from the past 240 hour(s)).  RADIOLOGY STUDIES/RESULTS:  Dg Knee 1-2 Views Left  Result Date: 10/16/2018 CLINICAL DATA:  Pain after fall EXAM: LEFT KNEE - 1-2 VIEW COMPARISON:  None. FINDINGS: No acute fracture or dislocation. No joint effusion. Moderate to severe degenerative changes. IMPRESSION: Degenerative changes.  No acute fracture noted. Electronically Signed   By: Dorise Bullion III M.D   On: 10/16/2018 20:32   Dg Knee 1-2 Views Right  Result Date: 10/16/2018 CLINICAL DATA:  Pain after fall EXAM: RIGHT KNEE - 1-2 VIEW COMPARISON:  None. FINDINGS: No acute fracture or effusion. Moderate to severe degenerative changes. IMPRESSION: No acute abnormality. Electronically Signed   By: Dorise Bullion III M.D   On: 10/16/2018 20:32   Dg Chest Port 1 View  Result Date: 10/16/2018 CLINICAL DATA:  PICC line placement. EXAM: PORTABLE CHEST 1 VIEW COMPARISON:  Radiographs and CT 10/07/2018. FINDINGS: 1223 hours. Left arm PICC tip is not optimally visualized, although appears to extend to the lower SVC level. The heart size and mediastinal contours are stable. There is aortic  atherosclerosis. Bilateral pleural thickening and subpleural rounded atelectasis peripherally in the left lung are unchanged. There is no pneumothorax. Glenohumeral degenerative changes are present bilaterally. IMPRESSION: PICC line tip at the lower SVC level. No pneumothorax or acute findings. Electronically Signed   By: Richardean Sale M.D.   On: 10/16/2018 13:03   Dg Abd Portable 1v  Result Date: 11/03/2018 CLINICAL DATA:  Constipation EXAM: PORTABLE ABDOMEN - 1 VIEW COMPARISON:  11/01/2018 FINDINGS: There is a non obstructive bowel gas pattern. No supine evidence of free air. No organomegaly or suspicious calcification. No acute bony abnormality. IMPRESSION: No acute findings. Electronically Signed   By: Rolm Baptise M.D.   On: 11/03/2018 11:32   Dg Abd Portable 1v  Result Date: 11/01/2018 CLINICAL DATA:  Constipation EXAM: PORTABLE ABDOMEN - 1 VIEW COMPARISON:  CT 01/09/2015 FINDINGS: Large stool burden throughout the colon. No evidence of bowel obstruction or free air. Prior cholecystectomy. Surgical sutures in the left upper abdomen. IMPRESSION: Large stool burden.  No bowel obstruction or free air. Electronically Signed   By: Rolm Baptise M.D.   On: 11/01/2018 11:03   Dg Foot 2 Views Left  Result Date: 10/17/2018 CLINICAL DATA:  Pain in left foot. EXAM: LEFT FOOT - 2 VIEW COMPARISON:  None. FINDINGS: Osteopenia. No fractures in the toes or metacarpals. Enthesopathic change at the posterior calcaneus. There is a lucency along the inferior aspect of the calcaneus which does not appear to extend through the calcaneus and is relatively well corticated. No other bony abnormalities. IMPRESSION: 1. There is a lucency along the inferior aspect of the calcaneus which is relatively well corticated and does not appear to extend through the calcaneus. This is favored to be nonacute. Recommend clinical correlation to exclude pain in this region. Electronically Signed   By: Dorise Bullion III M.D   On:  10/17/2018 15:05   Dg Fluoro Guided Needle Plc Aspiration/injection Loc  Result Date: 10/15/2018 CLINICAL DATA:  Left hip pain, evaluate for fluid collection EXAM: LEFT HIP ASPIRATION UNDER FLUOROSCOPY FLUOROSCOPY TIME:  Fluoroscopy Time:  18 seconds Radiation  Exposure Index (if provided by the fluoroscopic device): 2.3 mGy Number of Acquired Spot Images: 1 PROCEDURE: Overlying skin prepped with Betadine, draped in the usual sterile fashion, and infiltrated locally with buffered Lidocaine. 20 gauge spinal needle advanced to the prosthetic junction of the femoral head/neck. 1 ml of Lidocaine injected easily. No fluid/frank infection could be aspirated. 4 mL Lidocaine and 6 mL sterile saline administered into the joint with attempted subsequent aspiration. Less than 1 mL fluid was aspirated and sent for laboratory evaluation. No immediate complication. IMPRESSION: Technically successful left hip aspiration under fluoroscopy, as described above. Electronically Signed   By: Julian Hy M.D.   On: 10/15/2018 11:18   Dg Hips Bilat With Pelvis 2v  Result Date: 10/16/2018 CLINICAL DATA:  Fall.  Pain. EXAM: DG HIP (WITH OR WITHOUT PELVIS) 2V BILAT COMPARISON:  None. FINDINGS: The patient is status post bilateral hip replacements. Visualized hardware is in good position. No acute fractures. IMPRESSION: Bilateral hip replacements.  No acute fractures. Electronically Signed   By: Dorise Bullion III M.D   On: 10/16/2018 20:30   [place] Ekg Site Rite  Result Date: 10/15/2018 If Site Rite image not attached, placement could not be confirmed due to current cardiac rhythm.    LOS: 36 days   Signature  Lala Lund M.D on 11/09/2018 at 8:39 AM   -  To page go to [email or web address]

## 2018-11-10 ENCOUNTER — Encounter (HOSPITAL_COMMUNITY): Payer: Self-pay | Admitting: *Deleted

## 2018-11-10 LAB — GLUCOSE, CAPILLARY: Glucose-Capillary: 97 mg/dL (ref 70–99)

## 2018-11-10 MED ORDER — HYDROXYZINE HCL 25 MG PO TABS
25.0000 mg | ORAL_TABLET | Freq: Every day | ORAL | Status: DC | PRN
  Administered 2018-11-10 – 2018-12-05 (×27): 25 mg via ORAL
  Filled 2018-11-10 (×30): qty 1

## 2018-11-10 NOTE — Progress Notes (Signed)
Physical Therapy Treatment Patient Details Name: [name] MRN: [id number] DOB: 1946/07/31 Today's Date: 11/10/2018    History of Present Illness 73 y.o. male admitted 10/01/18 with generalized weakness, falls and SOB. Workup revealed afib with recurrent R-side pleural effusion; AKI. Pt with L hip cellulitis with chronic L hip prosthetic joint infection; s/p joint aspiration 2/14. PMH includes HTN, afib on Eliquis, OSA, bipolar disorder, obesity, L THA with infection and multiple prolonged hospitalizations.    PT Comments    Pt was seen for promotion of mobiltiy and strength in LE's with both transfers laterally and standing, as well as there ex to BLE's.  Pt is expecting to try to go home and continue to redirect this expectation to SNF care.  Follow up with all movement and working on trying to be up in the chair more often.   Follow Up Recommendations  SNF     Equipment Recommendations  None recommended by PT    Recommendations for Other Services       Precautions / Restrictions Precautions Precautions: Fall Restrictions Weight Bearing Restrictions: No    Mobility  Bed Mobility Overal bed mobility: Needs Assistance Bed Mobility: Supine to Sit;Sit to Supine Rolling: Mod assist   Supine to sit: Mod assist Sit to supine: Mod assist   General bed mobility comments: mod assist for trunk OOB and mod for legs back to bed  Transfers Overall transfer level: Needs assistance Equipment used: Rolling walker (2 wheeled);1 person hand held assist Transfers: Sit to/from Stand;Lateral/Scoot Transfers Sit to Stand: Max assist;From elevated surface        Lateral/Scoot Transfers: Mod assist;From elevated surface General transfer comment: had one brief stand with RW fairly upright and the rest partial standing and then did scooting with mod assist up the bed using bed pad  Ambulation/Gait             General Gait Details: unable   Stairs             Wheelchair  Mobility    Modified Rankin (Stroke Patients Only)       Balance Overall balance assessment: Needs assistance Sitting-balance support: Feet supported;Bilateral upper extremity supported Sitting balance-[name]: Good Sitting balance - Comments: pt is engaging core better to control sitting   Standing balance support: Bilateral upper extremity supported;During functional activity Standing balance-[name]: Poor                              Cognition Arousal/Alertness: Awake/alert Behavior During Therapy: WFL for tasks assessed/performed Overall Cognitive Status: Within Functional Limits for tasks assessed                                        Exercises General Exercises - Lower Extremity Ankle Circles/Pumps: AROM;Both;5 reps Quad Sets: AROM;Both;10 reps Heel Slides: AROM;Both;10 reps    General Comments General comments (skin integrity, edema, etc.): less complaint of R shoulder adn with rolling      Pertinent Vitals/Pain Pain Assessment: Faces Faces Pain Scale: Hurts little more Pain Location: R shoulder Pain Descriptors / Indicators: Guarding Pain Intervention(s): Monitored during session;Premedicated before session;Repositioned    Home Living                      Prior Function            PT  Goals (current goals can now be found in the care plan section) Acute Rehab PT Goals Patient Stated Goal: to get stronger and get home Progress towards PT goals: Progressing toward goals    Frequency    Min 2X/week      PT Plan Current plan remains appropriate    Co-evaluation              AM-PAC PT "6 Clicks" Mobility   Outcome Measure  Help needed turning from your back to your side while in a flat bed without using bedrails?: A Little Help needed moving from lying on your back to sitting on the side of a flat bed without using bedrails?: A Lot Help needed moving to and from a bed to a chair (including a  wheelchair)?: A Lot Help needed standing up from a chair using your arms (e.g., wheelchair or bedside chair)?: Total Help needed to walk in hospital room?: Total Help needed climbing 3-5 steps with a railing? : Total 6 Click Score: 10    End of Session Equipment Utilized During Treatment: Gait belt Activity Tolerance: Patient tolerated treatment well;Patient limited by fatigue Patient left: in bed;with call bell/phone within reach;with bed alarm set Nurse Communication: Mobility status PT Visit Diagnosis: Other abnormalities of gait and mobility (R26.89);Muscle weakness (generalized) (M62.81);Difficulty in walking, not elsewhere classified (R26.2);History of falling (Z91.81)     Time: 1245-8099 PT Time Calculation (min) (ACUTE ONLY): 37 min  Charges:  $Therapeutic Exercise: 8-22 mins $Therapeutic Activity: 8-22 mins                     Ramond Dial 11/10/2018, 2:23 PM  Mee Hives, PT MS Acute Rehab Dept. Number: [place] and [place]

## 2018-11-10 NOTE — Progress Notes (Signed)
Patient ID: [name], male   DOB: 07/19/1946, 73 y.o.   MRN: [id number]  PROGRESS NOTE    [name]  VQQ:[id number] DOB: November 29, 1945 DOA: 10/01/2018 PCP: [name], MD   Brief Narrative:  73 year old male with history of A. 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  He was found to have soft tissue infection of the left hip area, acute kidney injury, hyperkalemia.  He was initially hydrated, then developed volume overload for which she was diuresed with albumin and Lasix.  ID, orthopedics were consulted.  He underwent left hip aspiration on 10/15/2018.  ID recommended 6 weeks of IV Invanz.  He is awaiting placement.  Assessment & Plan:   Principal Problem:   Prosthetic joint infection of left hip (HCC) Active Problems:   Left hip postoperative wound infection   AKI (acute kidney injury) ([place])   Depression   Pleural effusion on left   OSA on CPAP   Unspecified atrial fibrillation (HCC)   Physical debility   Hyperkalemia   Other cirrhosis of liver (HCC)   Chronic back pain   Goals of care, counseling/discussion   Palliative care encounter  Left hip cellulitis with chronic left hip prosthetic joint infection in a patient with history of left total hip arthroplasty -Evaluated by ID and orthopedics.  Was on suppressive Flagyl for Bacteroides fragilis prior to presentation.  Status post left hip aspiration by IR on 10/15/2018.  ID is recommending 6 weeks of IV Invanz.  End date is November 25, 2018. -Patient will need outpatient follow-up with primary orthopedic surgeon/Dr. Ricki Rodriguez and ID  physician at [facility].  Acute kidney injury -Resolved.  Monitor electrolytes periodically.  Recurrent pleural effusion -Felt to be secondary to hepatic hydrothorax.  Most recent CT chest on 10/07/2018 showed a small pleural effusion.  Patient is a symptomatic at this point.  Doubt further work-up including thoracentesis will be required at this time.  Patient has had extensive recent work-up including multiple thoracentesis.  Cryptogenic cirrhosis -Prior.  Serology done earlier last month was negative  -Volume status is stable.  Continue spironolactone.  Blood pressure is on the lower side; not on Lasix currently.  Chronic atrial fibrillation -Continue Eliquis.  Rate controlled.  Continue Cardizem.  Chronic back and right shoulder pain -Continue Lidoderm patch, Tylenol and as needed narcotics.    Bipolar disorder -Continue Lexapro, Neurontin  Constipation -Probably due to narcotic use.  Improved with cutting down narcotics.  Continue laxative/bowel regimen.  He frequently requests a disimpaction without any evidence of rectal infection on x-ray, this was reported by several nurses recently.  Right shoulder pain -Patient has requested for cortisone shot.  Case was discussed with orthopedic/[name], PA with orthopedics which stated that orthopedic service does not do inpatient cortisone shots. -Continue Lidoderm patch and NSAID cream  OSA--continue CPAP nightly   Chronic deconditioning and debility -Waiting for SNF placement.  DVT prophylaxis: Eliquis Code Status: Full Family Communication: None at bedside Disposition Plan: SNF once bed is available  Consultants: Orthopedic/ID/IR  Procedures: Left hip aspiration by IR on 10/15/2018  Antimicrobials:  Anti-infectives (From admission, onward)   Start     Dose/Rate Route Frequency Ordered Stop   10/15/18  1500  cefTRIAXone (ROCEPHIN) 2 g in sodium chloride 0.9 % 100 mL IVPB  Status:  Discontinued     2 g 200 mL/hr over 30  Minutes Intravenous Every 24 hours 10/15/18 1434 10/15/18 1447   10/15/18 1500  ertapenem (INVANZ) 1,000 mg in sodium chloride 0.9 % 100 mL IVPB     1 g 200 mL/hr over 30 Minutes Intravenous Every 24 hours 10/15/18 1447 11/25/18 2359   10/08/18 0000  doxycycline (VIBRA-TABS) 100 MG tablet     100 mg Oral Every 12 hours 10/08/18 0921     10/06/18 1000  doxycycline (VIBRA-TABS) tablet 100 mg  Status:  Discontinued     100 mg Oral Every 12 hours 10/06/18 0726 10/11/18 1328   10/03/18 1800  metroNIDAZOLE (FLAGYL) tablet 500 mg  Status:  Discontinued     500 mg Oral Every 8 hours 10/03/18 1054 10/15/18 1447   10/03/18 0700  vancomycin (VANCOCIN) 2,000 mg in sodium chloride 0.9 % 500 mL IVPB  Status:  Discontinued     2,000 mg 250 mL/hr over 120 Minutes Intravenous Every 36 hours 10/01/18 1942 10/06/18 0726   10/02/18 0200  ceFEPIme (MAXIPIME) 2 g in sodium chloride 0.9 % 100 mL IVPB  Status:  Discontinued     2 g 200 mL/hr over 30 Minutes Intravenous Every 8 hours 10/01/18 1942 10/02/18 1008   10/01/18 1830  vancomycin (VANCOCIN) 2,500 mg in sodium chloride 0.9 % 500 mL IVPB     2,500 mg 250 mL/hr over 120 Minutes Intravenous STAT 10/01/18 1825 10/02/18 0126   10/01/18 1800  ceFEPIme (MAXIPIME) 2 g in sodium chloride 0.9 % 100 mL IVPB     2 g 200 mL/hr over 30 Minutes Intravenous  Once 10/01/18 1747 10/01/18 1903   10/01/18 1800  metroNIDAZOLE (FLAGYL) IVPB 500 mg  Status:  Discontinued     500 mg 100 mL/hr over 60 Minutes Intravenous Every 8 hours 10/01/18 1747 10/03/18 1054   10/01/18 1800  vancomycin (VANCOCIN) IVPB 1000 mg/200 mL premix  Status:  Discontinued     1,000 mg 200 mL/hr over 60 Minutes Intravenous  Once 10/01/18 1747 10/01/18 1825       Subjective: Patient seen and examined at bedside.  He is sleepy, wakes up slightly on calling his name.  Poor historian.  States that he could not sleep well last night.  No overnight fever, nausea or vomiting.  Objective: Vitals:    11/09/18 0520 11/09/18 1502 11/09/18 2241 11/10/18 0439  BP: 112/70 114/83 112/78   Pulse: 90 91 92   Resp:  19    Temp: (!) 97.4 F (36.3 C) 98 F (36.7 C) 98.3 F (36.8 C)   TempSrc: Oral Oral Oral   SpO2: 95% 98% 94%   Weight:    118 kg  Height:        Intake/Output Summary (Last 24 hours) at 11/10/2018 1424 Last data filed at 11/10/2018 1155 Gross per 24 hour  Intake 560 ml  Output 2705 ml  Net -2145 ml   Filed Weights   11/08/18 0300 11/09/18 0500 11/10/18 0439  Weight: 120.3 kg 116.3 kg 118 kg    Examination:  General exam: Appears calm and comfortable.  Chronically ill looking.  Wakes up on calling his name.  Poor historian Respiratory system: Bilateral decreased breath sounds at bases, basilar crackles Cardiovascular system: S1 & S2 heard, Rate controlled Gastrointestinal system: Abdomen is nondistended, soft and nontender. Normal bowel sounds heard. Extremities: No cyanosis, clubbing; trace  edema.  Left hip redness present   Data Reviewed: I have personally reviewed following labs and imaging studies  CBC: Recent Labs  Lab 11/08/18 0349  WBC 7.2  HGB 11.0*  HCT 35.6*  MCV 90.6  PLT 161   Basic Metabolic Panel: Recent Labs  Lab 11/08/18 0349  NA 137  K 4.2  CL 101  CO2 25  GLUCOSE 98  BUN 38*  CREATININE 1.05  CALCIUM 10.0  MG 2.1   GFR: Estimated Creatinine Clearance: 79.3 mL/min (by C-G formula based on SCr of 1.05 mg/dL). Liver Function Tests: No results for input(s): AST, ALT, ALKPHOS, BILITOT, PROT, ALBUMIN in the last 168 hours. No results for input(s): LIPASE, AMYLASE in the last 168 hours. No results for input(s): AMMONIA in the last 168 hours. Coagulation Profile: No results for input(s): INR, PROTIME in the last 168 hours. Cardiac Enzymes: No results for input(s): CKTOTAL, CKMB, CKMBINDEX, TROPONINI in the last 168 hours. BNP (last 3 results) No results for input(s): PROBNP in the last 8760 hours. HbA1C: No results for  input(s): HGBA1C in the last 72 hours. CBG: Recent Labs  Lab 11/07/18 0754 11/07/18 1953 11/08/18 0743 11/09/18 0811 11/10/18 0732  GLUCAP 82 126* 96 86 97   Lipid Profile: No results for input(s): CHOL, HDL, LDLCALC, TRIG, CHOLHDL, LDLDIRECT in the last 72 hours. Thyroid Function Tests: No results for input(s): TSH, T4TOTAL, FREET4, T3FREE, THYROIDAB in the last 72 hours. Anemia Panel: No results for input(s): VITAMINB12, FOLATE, FERRITIN, TIBC, IRON, RETICCTPCT in the last 72 hours. Sepsis Labs: No results for input(s): PROCALCITON, LATICACIDVEN in the last 168 hours.  No results found for this or any previous visit (from the past 240 hour(s)).       Radiology Studies: No results found.      Scheduled Meds: . apixaban  5 mg Oral BID  . cycloSPORINE  1 drop Both Eyes BID  . diclofenac sodium  2 g Topical BID  . diltiazem  120 mg Oral Daily  . escitalopram  10 mg Oral Daily  . gabapentin  100 mg Oral TID  . lidocaine  1 patch Transdermal Q24H  . Melatonin  6 mg Oral QHS  . methylnaltrexone  12 mg Subcutaneous QODAY  . midodrine  10 mg Oral TID WC  . multivitamin with minerals  1 tablet Oral Daily  . pantoprazole  40 mg Oral Daily  . Ensure Max Protein  11 oz Oral BID  . spironolactone  50 mg Oral Daily  . thiamine  100 mg Oral Daily   Continuous Infusions: . sodium chloride 250 mL (10/24/18 0052)  . ertapenem 1,000 mg (11/09/18 1421)     LOS: 37 days        Aline August, MD [facility] 11/10/2018, 2:24 PM

## 2018-11-10 NOTE — Progress Notes (Signed)
RT added sterile water to home CPAP machine.  RT assistance not needed at this time.

## 2018-11-11 LAB — COMPREHENSIVE METABOLIC PANEL
ALT: 20 U/L (ref 0–44)
AST: 22 U/L (ref 15–41)
Albumin: 2.7 g/dL — ABNORMAL LOW (ref 3.5–5.0)
Alkaline Phosphatase: 90 U/L (ref 38–126)
Anion gap: 8 (ref 5–15)
BUN: 35 mg/dL — ABNORMAL HIGH (ref 8–23)
CO2: 25 mmol/L (ref 22–32)
Calcium: 10.4 mg/dL — ABNORMAL HIGH (ref 8.9–10.3)
Chloride: 105 mmol/L (ref 98–111)
Creatinine, Ser: 0.99 mg/dL (ref 0.61–1.24)
GFR calc Af Amer: >60 mL/min (ref >60)
GFR calc non Af Amer: >60 mL/min (ref >60)
Glucose, Bld: 100 mg/dL — ABNORMAL HIGH (ref 70–99)
Potassium: 3.9 mmol/L (ref 3.5–5.1)
Sodium: 138 mmol/L (ref 135–145)
Total Bilirubin: 0.5 mg/dL (ref 0.3–1.2)
Total Protein: 6.5 g/dL (ref 6.5–8.1)

## 2018-11-11 LAB — CBC WITH DIFFERENTIAL/PLATELET
Abs Immature Granulocytes: 0.02 10*3/uL (ref 0.00–0.07)
Basophils Absolute: 0 10*3/uL (ref 0.0–0.1)
Basophils Relative: 0 %
Eosinophils Absolute: 0.3 10*3/uL (ref 0.0–0.5)
Eosinophils Relative: 4 %
HCT: 35.2 % — ABNORMAL LOW (ref 39.0–52.0)
Hemoglobin: 11.1 g/dL — ABNORMAL LOW (ref 13.0–17.0)
Immature Granulocytes: 0 %
Lymphocytes Relative: 26 %
Lymphs Abs: 1.9 10*3/uL (ref 0.7–4.0)
MCH: 28.2 pg (ref 26.0–34.0)
MCHC: 31.5 g/dL (ref 30.0–36.0)
MCV: 89.6 fL (ref 80.0–100.0)
Monocytes Absolute: 0.9 10*3/uL (ref 0.1–1.0)
Monocytes Relative: 12 %
Neutro Abs: 4.2 10*3/uL (ref 1.7–7.7)
Neutrophils Relative %: 58 %
Platelets: 255 10*3/uL (ref 150–400)
RBC: 3.93 MIL/uL — ABNORMAL LOW (ref 4.22–5.81)
RDW: 16.6 % — ABNORMAL HIGH (ref 11.5–15.5)
WBC: 7.3 10*3/uL (ref 4.0–10.5)
nRBC: 0 % (ref 0.0–0.2)

## 2018-11-11 LAB — MAGNESIUM: Magnesium: 2 mg/dL (ref 1.7–2.4)

## 2018-11-11 LAB — GLUCOSE, CAPILLARY: Glucose-Capillary: 96 mg/dL (ref 70–99)

## 2018-11-11 MED ORDER — FLEET ENEMA 7-19 GM/118ML RE ENEM
1.0000 | ENEMA | Freq: Every day | RECTAL | Status: DC | PRN
  Filled 2018-11-11: qty 1

## 2018-11-11 MED ORDER — SENNOSIDES-DOCUSATE SODIUM 8.6-50 MG PO TABS
1.0000 | ORAL_TABLET | Freq: Two times a day (BID) | ORAL | Status: DC
  Administered 2018-11-11 – 2018-11-15 (×9): 1 via ORAL
  Filled 2018-11-11 (×9): qty 1

## 2018-11-11 MED ORDER — POLYETHYLENE GLYCOL 3350 17 G PO PACK
17.0000 g | PACK | Freq: Every day | ORAL | Status: DC
  Administered 2018-11-11 – 2018-11-16 (×6): 17 g via ORAL
  Filled 2018-11-11 (×6): qty 1

## 2018-11-11 NOTE — Progress Notes (Signed)
Nutrition Follow-up  DOCUMENTATION CODES:   Morbid obesity  INTERVENTION:   - Continue Ensure Max po BID, each supplement provides 150 kcal and 30 grams of protein  - Continue MVI with minerals daily  NUTRITION DIAGNOSIS:   Increased nutrient needs related to chronic illness (cirrhosis) as evidenced by estimated needs.  Ongoing, being addressed via oral nutrition supplements  GOAL:   Patient will meet greater than or equal to 90% of their needs  Progressing  MONITOR:   PO intake, Supplement acceptance, Labs, Weight trends, Skin, I & O's  REASON FOR ASSESSMENT:   Malnutrition Screening Tool    ASSESSMENT:   73 y.o. male with medical history significant for chronic back and right shoulder pain, OSA on CPAP, paroxysmal atrial fibrillation on Eliquis, hepatic cirrhosis, recurrent pleural effusions, and status post left total hip arthroplasty complicated by infection  Pt is medically stable and continues to await bed for placement. Pt on difficult to place SNF waitlist.End date of IV Invanz is 11/25/18.  Reviewed daily weights. Overall, weight is down 12 lbs since admission weight. Suspect this is related to lack of mobility and muscle wasting.  Discussed pt with RN. Per RN, pt continues to eat well and drink 100% of Ensure Max supplements.  Meal Completion: 100%  Medications reviewed and include: MVI with minerals, Protonix, Ensure Max BID (pt accepting 100%), thiamine, IV Invanz  Labs reviewed. CBG's: 96 today  UOP: 1550 ml x 24 hours  Diet Order:   Diet Order            Diet 2 gram sodium Room service appropriate? Yes; Fluid consistency: Thin  Diet effective now        Diet - low sodium heart healthy              EDUCATION NEEDS:   Education needs have been addressed  Skin:  Skin Assessment: Skin Integrity Issues: Incisions: L hip Other: MASD bilateral on buttocks  Last BM:  3/10  Height:   Ht Readings from Last 1 Encounters:  10/01/18 5\' 8"   (1.727 m)    Weight:   Wt Readings from Last 1 Encounters:  11/10/18 118 kg    Ideal Body Weight:  70 kg  BMI:  Body mass index is 39.55 kg/m.  Estimated Nutritional Needs:   Kcal:  2725-3664  Protein:  125-140 grams  Fluid:  2.2-2.4 L    Gaynell Face, MS, RD, LDN Inpatient Clinical Dietitian Pager: [phone] Weekend/After Hours: [phone]

## 2018-11-11 NOTE — Progress Notes (Signed)
Patient ID: [name], male   DOB: 25-Nov-1945, 73 y.o.   MRN: [id number]  PROGRESS NOTE    [name]  [id number]:[id number] DOB: [birth year] DOA: 10/01/2018 PCP: [name], MD   Brief Narrative:  73 year old male with history of A. 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  He was found to have soft tissue infection of the left hip area, acute kidney injury, hyperkalemia.  He was initially hydrated, then developed volume overload for which she was diuresed with albumin and Lasix.  ID, orthopedics were consulted.  He underwent left hip aspiration on 10/15/2018.  ID recommended 6 weeks of IV Invanz.  He is awaiting placement.  Assessment & Plan:   Principal Problem:   Prosthetic joint infection of left hip (HCC) Active Problems:   Left hip postoperative wound infection   AKI (acute kidney injury) ([place])   Depression   Pleural effusion on left   OSA on CPAP   Unspecified atrial fibrillation (HCC)   Physical debility   Hyperkalemia   Other cirrhosis of liver (HCC)   Chronic back pain   Goals of care, counseling/discussion   Palliative care encounter  Left hip cellulitis with chronic left hip prosthetic joint infection in a patient with history of left total hip arthroplasty -Evaluated by ID and orthopedics.  Was on suppressive Flagyl for Bacteroides fragilis prior to presentation.  Status post left hip aspiration by IR on 10/15/2018.  ID is recommending 6 weeks of IV Invanz.  End date is November 25, 2018. -Patient will need outpatient follow-up with primary orthopedic surgeon/Dr. Ricki Rodriguez and ID  physician at [facility].  Acute kidney injury -Resolved.  Monitor electrolytes periodically.  Recurrent pleural effusion -Felt to be secondary to hepatic hydrothorax.  Most recent CT chest on 10/07/2018 showed a small pleural effusion.  Patient is a symptomatic at this point.  Doubt further work-up including thoracentesis will be required at this time.  Patient has had extensive recent work-up including multiple thoracentesis.  Cryptogenic cirrhosis -Prior.  Serology done earlier last month was negative  -Volume status is stable.  Continue spironolactone.  Blood pressure is on the lower side; not on Lasix currently.  Chronic atrial fibrillation -Continue Eliquis.  Rate controlled.  Continue Cardizem.  Chronic back and right shoulder pain -Continue Lidoderm patch, Tylenol and as needed narcotics.    Bipolar disorder -Continue Lexapro, Neurontin  Constipation -Probably due to narcotic use.  Improved with cutting down narcotics.  Continue laxative/bowel regimen.  He frequently requests a disimpaction without any evidence of rectal infection on x-ray, this was reported by several nurses recently.  Right shoulder pain -Patient has requested for cortisone shot.  Case was discussed with orthopedic/[name], PA with orthopedics which stated that orthopedic service does not do inpatient cortisone shots. -Continue Lidoderm patch and NSAID cream  OSA--continue CPAP nightly   Chronic deconditioning and debility -Waiting for SNF placement.  DVT prophylaxis: Eliquis Code Status: Full Family Communication: None at bedside Disposition Plan: SNF once bed is available  Consultants: Orthopedic/ID/IR  Procedures: Left hip aspiration by IR on 10/15/2018  Antimicrobials:  Anti-infectives (From admission, onward)   Start     Dose/Rate Route Frequency Ordered Stop   10/15/18  1500  cefTRIAXone (ROCEPHIN) 2 g in sodium chloride 0.9 % 100 mL IVPB  Status:  Discontinued     2 g 200 mL/hr over 30  Minutes Intravenous Every 24 hours 10/15/18 1434 10/15/18 1447   10/15/18 1500  ertapenem (INVANZ) 1,000 mg in sodium chloride 0.9 % 100 mL IVPB     1 g 200 mL/hr over 30 Minutes Intravenous Every 24 hours 10/15/18 1447 11/25/18 2359   10/08/18 0000  doxycycline (VIBRA-TABS) 100 MG tablet     100 mg Oral Every 12 hours 10/08/18 0921     10/06/18 1000  doxycycline (VIBRA-TABS) tablet 100 mg  Status:  Discontinued     100 mg Oral Every 12 hours 10/06/18 0726 10/11/18 1328   10/03/18 1800  metroNIDAZOLE (FLAGYL) tablet 500 mg  Status:  Discontinued     500 mg Oral Every 8 hours 10/03/18 1054 10/15/18 1447   10/03/18 0700  vancomycin (VANCOCIN) 2,000 mg in sodium chloride 0.9 % 500 mL IVPB  Status:  Discontinued     2,000 mg 250 mL/hr over 120 Minutes Intravenous Every 36 hours 10/01/18 1942 10/06/18 0726   10/02/18 0200  ceFEPIme (MAXIPIME) 2 g in sodium chloride 0.9 % 100 mL IVPB  Status:  Discontinued     2 g 200 mL/hr over 30 Minutes Intravenous Every 8 hours 10/01/18 1942 10/02/18 1008   10/01/18 1830  vancomycin (VANCOCIN) 2,500 mg in sodium chloride 0.9 % 500 mL IVPB     2,500 mg 250 mL/hr over 120 Minutes Intravenous STAT 10/01/18 1825 10/02/18 0126   10/01/18 1800  ceFEPIme (MAXIPIME) 2 g in sodium chloride 0.9 % 100 mL IVPB     2 g 200 mL/hr over 30 Minutes Intravenous  Once 10/01/18 1747 10/01/18 1903   10/01/18 1800  metroNIDAZOLE (FLAGYL) IVPB 500 mg  Status:  Discontinued     500 mg 100 mL/hr over 60 Minutes Intravenous Every 8 hours 10/01/18 1747 10/03/18 1054   10/01/18 1800  vancomycin (VANCOCIN) IVPB 1000 mg/200 mL premix  Status:  Discontinued     1,000 mg 200 mL/hr over 60 Minutes Intravenous  Once 10/01/18 1747 10/01/18 1825        Subjective: Patient seen and examined at bedside.  He is awake, complains of right shoulder pain.  Poor historian.  No overnight fever, nausea or vomiting.  Objective: Vitals:   11/10/18 0439 11/10/18 1502 11/10/18 2212 11/11/18 0626   BP:  100/64 (!) 120/99 100/70  Pulse:  83 85 97  Resp:  16 18 19   Temp:  98.6 F (37 C) 98.2 F (36.8 C) 97.7 F (36.5 C)  TempSrc:  Oral  Oral  SpO2:  98% 96% 97%  Weight: 118 kg     Height:        Intake/Output Summary (Last 24 hours) at 11/11/2018 1113 Last data filed at 11/11/2018 1045 Gross per 24 hour  Intake 290 ml  Output 1950 ml  Net -1660 ml   Filed Weights   11/08/18 0300 11/09/18 0500 11/10/18 0439  Weight: 120.3 kg 116.3 kg 118 kg    Examination:  General exam: Appears calm and comfortable.  Chronically ill looking.  No acute distress.  Poor historian.   Respiratory system: Bilateral decreased breath sounds at bases, basilar crackles.  No wheezing Cardiovascular system: S1 & S2 heard, Rate controlled Gastrointestinal system: Abdomen is nondistended, soft and nontender. Normal bowel sounds heard. Extremities: No cyanosis, clubbing; trace edema.  Left hip redness present   Data  Reviewed: I have personally reviewed following labs and imaging studies  CBC: Recent Labs  Lab 11/08/18 0349 11/11/18 0430  WBC 7.2 7.3  NEUTROABS  --  4.2  HGB 11.0* 11.1*  HCT 35.6* 35.2*  MCV 90.6 89.6  PLT 247 564   Basic Metabolic Panel: Recent Labs  Lab 11/08/18 0349 11/11/18 0430  NA 137 138  K 4.2 3.9  CL 101 105  CO2 25 25  GLUCOSE 98 100*  BUN 38* 35*  CREATININE 1.05 0.99  CALCIUM 10.0 10.4*  MG 2.1 2.0   GFR: Estimated Creatinine Clearance: 84.1 mL/min (by C-G formula based on SCr of 0.99 mg/dL). Liver Function Tests: Recent Labs  Lab 11/11/18 0430  AST 22  ALT 20  ALKPHOS 90  BILITOT 0.5  PROT 6.5  ALBUMIN 2.7*   No results for input(s): LIPASE, AMYLASE in the last 168 hours. No results for input(s): AMMONIA in the last 168 hours. Coagulation Profile: No results for input(s): INR, PROTIME in the last 168 hours. Cardiac Enzymes: No results for input(s): CKTOTAL, CKMB, CKMBINDEX, TROPONINI in the last 168 hours. BNP (last 3 results) No  results for input(s): PROBNP in the last 8760 hours. HbA1C: No results for input(s): HGBA1C in the last 72 hours. CBG: Recent Labs  Lab 11/07/18 1953 11/08/18 0743 11/09/18 0811 11/10/18 0732 11/11/18 0854  GLUCAP 126* 96 86 97 96   Lipid Profile: No results for input(s): CHOL, HDL, LDLCALC, TRIG, CHOLHDL, LDLDIRECT in the last 72 hours. Thyroid Function Tests: No results for input(s): TSH, T4TOTAL, FREET4, T3FREE, THYROIDAB in the last 72 hours. Anemia Panel: No results for input(s): VITAMINB12, FOLATE, FERRITIN, TIBC, IRON, RETICCTPCT in the last 72 hours. Sepsis Labs: No results for input(s): PROCALCITON, LATICACIDVEN in the last 168 hours.  No results found for this or any previous visit (from the past 240 hour(s)).       Radiology Studies: No results found.      Scheduled Meds: . apixaban  5 mg Oral BID  . cycloSPORINE  1 drop Both Eyes BID  . diclofenac sodium  2 g Topical BID  . diltiazem  120 mg Oral Daily  . escitalopram  10 mg Oral Daily  . gabapentin  100 mg Oral TID  . lidocaine  1 patch Transdermal Q24H  . Melatonin  6 mg Oral QHS  . methylnaltrexone  12 mg Subcutaneous QODAY  . midodrine  10 mg Oral TID WC  . multivitamin with minerals  1 tablet Oral Daily  . pantoprazole  40 mg Oral Daily  . Ensure Max Protein  11 oz Oral BID  . spironolactone  50 mg Oral Daily  . thiamine  100 mg Oral Daily   Continuous Infusions: . sodium chloride 250 mL (10/24/18 0052)  . ertapenem 1,000 mg (11/10/18 1444)     LOS: 3 days        Aline August, MD [facility] 11/11/2018, 11:13 AM

## 2018-11-12 ENCOUNTER — Telehealth: Payer: Self-pay | Admitting: *Deleted

## 2018-11-12 MED ORDER — HYDROXYZINE HCL 25 MG PO TABS
25.0000 mg | ORAL_TABLET | Freq: Once | ORAL | Status: AC
  Administered 2018-11-12: 25 mg via ORAL
  Filled 2018-11-12: qty 1

## 2018-11-12 MED ORDER — HYDROXYZINE HCL 25 MG PO TABS
25.0000 mg | ORAL_TABLET | Freq: Once | ORAL | Status: AC
  Administered 2018-11-12: 25 mg via ORAL

## 2018-11-12 NOTE — Progress Notes (Signed)
Patient remains on the SNF [facility] waitlist.   Cedric Fishman LCSW [phone]

## 2018-11-12 NOTE — Progress Notes (Signed)
Patient ID: [name], male   DOB: 08-Mar-1946, 73 y.o.   MRN: [id number]  PROGRESS NOTE    [name]  [id number]:[id number] DOB: 03-Oct-1945 DOA: 10/01/2018 PCP: [name], MD   Brief Narrative:  73 year old male with history of A. 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  He was found to have soft tissue infection of the left hip area, acute kidney injury, hyperkalemia.  He was initially hydrated, then developed volume overload for which she was diuresed with albumin and Lasix.  ID, orthopedics were consulted.  He underwent left hip aspiration on 10/15/2018.  ID recommended 6 weeks of IV Invanz.  He is awaiting placement.  Assessment & Plan:   Principal Problem:   Prosthetic joint infection of left hip (HCC) Active Problems:   Left hip postoperative wound infection   AKI (acute kidney injury) ([place])   Depression   Pleural effusion on left   OSA on CPAP   Unspecified atrial fibrillation (HCC)   Physical debility   Hyperkalemia   Other cirrhosis of liver (HCC)   Chronic back pain   Goals of care, counseling/discussion   Palliative care encounter  Left hip cellulitis with chronic left hip prosthetic joint infection in a patient with history of left total hip arthroplasty -Evaluated by ID and orthopedics.  Was on suppressive Flagyl for Bacteroides fragilis prior to presentation.  Status post left hip aspiration by IR on 10/15/2018.  ID is recommending 6 weeks of IV Invanz.  End date is November 25, 2018. -Patient will need outpatient follow-up with primary orthopedic surgeon/Dr. Ricki Rodriguez and ID  physician at [facility].  Acute kidney injury -Resolved.  Monitor electrolytes periodically.  Recurrent pleural effusion -Felt to be secondary to hepatic hydrothorax.  Most recent CT chest on 10/07/2018 showed a small pleural effusion.  Patient is a symptomatic at this point.  Doubt further work-up including thoracentesis will be required at this time.  Patient has had extensive recent work-up including multiple thoracentesis.  Cryptogenic cirrhosis -Prior.  Serology done earlier last month was negative  -Volume status is stable.  Continue spironolactone.  Blood pressure is on the lower side; not on Lasix currently.  Chronic atrial fibrillation -Continue Eliquis.  Rate controlled.  Continue Cardizem.  Chronic back and right shoulder pain -Continue Lidoderm patch, Tylenol and as needed narcotics.    Bipolar disorder -Continue Lexapro, Neurontin  Constipation -Probably due to narcotic use.  Improved with cutting down narcotics.  Continue laxative/bowel regimen.  He frequently requests a disimpaction without any evidence of rectal infection on x-ray, this was reported by several nurses recently.  Right shoulder pain -Patient has requested for cortisone shot.  Case was discussed with orthopedic/[name], PA with orthopedics which stated that orthopedic service does not do inpatient cortisone shots. -Continue Lidoderm patch and NSAID cream  OSA--continue CPAP nightly   Chronic deconditioning and debility -Waiting for SNF placement.  DVT prophylaxis: Eliquis Code Status: Full Family Communication: None at bedside Disposition Plan: SNF once bed is available  Consultants: Orthopedic/ID/IR  Procedures: Left hip aspiration by IR on 10/15/2018  Antimicrobials:  Anti-infectives (From admission, onward)   Start     Dose/Rate Route Frequency Ordered Stop   10/15/18  1500  cefTRIAXone (ROCEPHIN) 2 g in sodium chloride 0.9 % 100 mL IVPB  Status:  Discontinued     2 g 200 mL/hr over 30  Minutes Intravenous Every 24 hours 10/15/18 1434 10/15/18 1447   10/15/18 1500  ertapenem (INVANZ) 1,000 mg in sodium chloride 0.9 % 100 mL IVPB     1 g 200 mL/hr over 30 Minutes Intravenous Every 24 hours 10/15/18 1447 11/25/18 2359   10/08/18 0000  doxycycline (VIBRA-TABS) 100 MG tablet     100 mg Oral Every 12 hours 10/08/18 0921     10/06/18 1000  doxycycline (VIBRA-TABS) tablet 100 mg  Status:  Discontinued     100 mg Oral Every 12 hours 10/06/18 0726 10/11/18 1328   10/03/18 1800  metroNIDAZOLE (FLAGYL) tablet 500 mg  Status:  Discontinued     500 mg Oral Every 8 hours 10/03/18 1054 10/15/18 1447   10/03/18 0700  vancomycin (VANCOCIN) 2,000 mg in sodium chloride 0.9 % 500 mL IVPB  Status:  Discontinued     2,000 mg 250 mL/hr over 120 Minutes Intravenous Every 36 hours 10/01/18 1942 10/06/18 0726   10/02/18 0200  ceFEPIme (MAXIPIME) 2 g in sodium chloride 0.9 % 100 mL IVPB  Status:  Discontinued     2 g 200 mL/hr over 30 Minutes Intravenous Every 8 hours 10/01/18 1942 10/02/18 1008   10/01/18 1830  vancomycin (VANCOCIN) 2,500 mg in sodium chloride 0.9 % 500 mL IVPB     2,500 mg 250 mL/hr over 120 Minutes Intravenous STAT 10/01/18 1825 10/02/18 0126   10/01/18 1800  ceFEPIme (MAXIPIME) 2 g in sodium chloride 0.9 % 100 mL IVPB     2 g 200 mL/hr over 30 Minutes Intravenous  Once 10/01/18 1747 10/01/18 1903   10/01/18 1800  metroNIDAZOLE (FLAGYL) IVPB 500 mg  Status:  Discontinued     500 mg 100 mL/hr over 60 Minutes Intravenous Every 8 hours 10/01/18 1747 10/03/18 1054   10/01/18 1800  vancomycin (VANCOCIN) IVPB 1000 mg/200 mL premix  Status:  Discontinued     1,000 mg 200 mL/hr over 60 Minutes Intravenous  Once 10/01/18 1747 10/01/18 1825         Subjective: Patient seen and examined at bedside.  He is sleepy, hardly wakes up on calling his name.  No overnight fever or vomiting reported. Objective: Vitals:   11/11/18 1343 11/11/18 1810 11/11/18 2138 11/12/18 0554  BP: 121/76   107/67 96/70  Pulse: (!) 109  91 93  Resp: 16  18 19   Temp: 98.1 F (36.7 C)  98 F (36.7 C) (!) 97.5 F (36.4 C)  TempSrc: Oral   Oral  SpO2: 98%  97% 95%  Weight:  118.8 kg  118 kg  Height:        Intake/Output Summary (Last 24 hours) at 11/12/2018 0933 Last data filed at 11/12/2018 0612 Gross per 24 hour  Intake -  Output 1100 ml  Net -1100 ml   Filed Weights   11/10/18 0439 11/11/18 1810 11/12/18 0554  Weight: 118 kg 118.8 kg 118 kg    Examination:  General exam: Sleepy, wakes up only very slightly, hardly answers questions.  No acute distress.  Chronically ill looking.    Respiratory system: Bilateral decreased breath sounds at bases, basilar crackles.   Cardiovascular system: S1 & S2 heard, Rate controlled Gastrointestinal system: Abdomen is nondistended, soft and nontender. Normal bowel sounds heard. Extremities: No cyanosis; trace edema.  Left hip redness present  Data Reviewed: I have personally reviewed following labs and imaging studies  CBC: Recent Labs  Lab 11/08/18 0349 11/11/18 0430  WBC 7.2 7.3  NEUTROABS  --  4.2  HGB 11.0* 11.1*  HCT 35.6* 35.2*  MCV 90.6 89.6  PLT 247 161   Basic Metabolic Panel: Recent Labs  Lab 11/08/18 0349 11/11/18 0430  NA 137 138  K 4.2 3.9  CL 101 105  CO2 25 25  GLUCOSE 98 100*  BUN 38* 35*  CREATININE 1.05 0.99  CALCIUM 10.0 10.4*  MG 2.1 2.0   GFR: Estimated Creatinine Clearance: 84.1 mL/min (by C-G formula based on SCr of 0.99 mg/dL). Liver Function Tests: Recent Labs  Lab 11/11/18 0430  AST 22  ALT 20  ALKPHOS 90  BILITOT 0.5  PROT 6.5  ALBUMIN 2.7*   No results for input(s): LIPASE, AMYLASE in the last 168 hours. No results for input(s): AMMONIA in the last 168 hours. Coagulation Profile: No results for input(s): INR, PROTIME in the last 168 hours. Cardiac Enzymes: No results for input(s): CKTOTAL, CKMB, CKMBINDEX, TROPONINI in the last 168 hours. BNP (last 3 results) No results for  input(s): PROBNP in the last 8760 hours. HbA1C: No results for input(s): HGBA1C in the last 72 hours. CBG: Recent Labs  Lab 11/07/18 1953 11/08/18 0743 11/09/18 0811 11/10/18 0732 11/11/18 0854  GLUCAP 126* 96 86 97 96   Lipid Profile: No results for input(s): CHOL, HDL, LDLCALC, TRIG, CHOLHDL, LDLDIRECT in the last 72 hours. Thyroid Function Tests: No results for input(s): TSH, T4TOTAL, FREET4, T3FREE, THYROIDAB in the last 72 hours. Anemia Panel: No results for input(s): VITAMINB12, FOLATE, FERRITIN, TIBC, IRON, RETICCTPCT in the last 72 hours. Sepsis Labs: No results for input(s): PROCALCITON, LATICACIDVEN in the last 168 hours.  No results found for this or any previous visit (from the past 240 hour(s)).       Radiology Studies: No results found.      Scheduled Meds: . apixaban  5 mg Oral BID  . cycloSPORINE  1 drop Both Eyes BID  . diclofenac sodium  2 g Topical BID  . diltiazem  120 mg Oral Daily  . escitalopram  10 mg Oral Daily  . gabapentin  100 mg Oral TID  . lidocaine  1 patch Transdermal Q24H  . Melatonin  6 mg Oral QHS  . methylnaltrexone  12 mg Subcutaneous QODAY  . midodrine  10 mg Oral TID WC  . multivitamin with minerals  1 tablet Oral Daily  . pantoprazole  40 mg Oral Daily  . polyethylene glycol  17 g Oral Daily  . Ensure Max Protein  11 oz Oral BID  . senna-docusate  1 tablet Oral BID  . spironolactone  50 mg Oral Daily  . thiamine  100 mg Oral Daily   Continuous Infusions: . sodium chloride 250 mL (10/24/18 0052)  . ertapenem Stopped (11/12/18 0150)     LOS: 4 days        Aline August, MD [facility] 11/12/2018, 9:33 AM

## 2018-11-12 NOTE — Telephone Encounter (Signed)
Patient received call regarding appointment 3/18. He states he is still in the hospital, will likely not be discharged before that date. He will call back to reschedule. Landis Gandy, RN

## 2018-11-13 LAB — GLUCOSE, CAPILLARY: Glucose-Capillary: 85 mg/dL (ref 70–99)

## 2018-11-13 NOTE — Progress Notes (Signed)
Patient ID: [name], male   DOB: 02/18/1946, 73 y.o.   MRN: [id number]  PROGRESS NOTE    [name]  NLZ:[id number] DOB: Dec 06, 1945 DOA: 10/01/2018 PCP: [name], MD   Brief Narrative:  73 year old male with history of A. 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  He was found to have soft tissue infection of the left hip area, acute kidney injury, hyperkalemia.  He was initially hydrated, then developed volume overload for which she was diuresed with albumin and Lasix.  ID, orthopedics were consulted.  He underwent left hip aspiration on 10/15/2018.  ID recommended 6 weeks of IV Invanz.  He is awaiting placement.  Assessment & Plan:   Principal Problem:   Prosthetic joint infection of left hip (HCC) Active Problems:   Left hip postoperative wound infection   AKI (acute kidney injury) ([place])   Depression   Pleural effusion on left   OSA on CPAP   Unspecified atrial fibrillation (HCC)   Physical debility   Hyperkalemia   Other cirrhosis of liver (HCC)   Chronic back pain   Goals of care, counseling/discussion   Palliative care encounter  Left hip cellulitis with chronic left hip prosthetic joint infection in a patient with history of left total hip arthroplasty -Evaluated by ID and orthopedics.  Was on suppressive Flagyl for Bacteroides fragilis prior to presentation.  Status post left hip aspiration by IR on 10/15/2018.  ID is recommending 6 weeks of IV Invanz.  End date is November 25, 2018. -Patient will need outpatient follow-up with primary orthopedic surgeon/Dr. Ricki Rodriguez and ID  physician at [facility].  Acute kidney injury -Resolved.  Monitor electrolytes periodically.  Recurrent pleural effusion -Felt to be secondary to hepatic hydrothorax.  Most recent CT chest on 10/07/2018 showed a small pleural effusion.  Patient is a symptomatic at this point.  Doubt further work-up including thoracentesis will be required at this time.  Patient has had extensive recent work-up including multiple thoracentesis.  Cryptogenic cirrhosis -Prior.  Serology done earlier last month was negative  -Volume status is stable.  Continue spironolactone.  Blood pressure is on the lower side; not on Lasix currently.  Chronic atrial fibrillation -Continue Eliquis.  Rate controlled.  Continue Cardizem.  Chronic back and right shoulder pain -Continue Lidoderm patch, Tylenol and as needed narcotics.    Bipolar disorder -Continue Lexapro, Neurontin  Constipation -Probably due to narcotic use.  Improved with cutting down narcotics.  Continue laxative/bowel regimen.  He frequently requests a disimpaction without any evidence of rectal impaction on x-ray, this was reported by several nurses recently.  Right shoulder pain -Patient has requested for cortisone shot.  Case was discussed with orthopedic/[name], PA with orthopedics which stated that orthopedic service does not do inpatient cortisone shots. -Continue Lidoderm patch and NSAID cream  OSA--continue CPAP nightly   Chronic deconditioning and debility -Waiting for SNF placement.  DVT prophylaxis: Eliquis Code Status: Full Family Communication: None at bedside Disposition Plan: SNF once bed is available  Consultants: Orthopedic/ID/IR  Procedures: Left hip aspiration by IR on 10/15/2018  Antimicrobials:  Anti-infectives (From admission, onward)   Start     Dose/Rate Route Frequency Ordered Stop   10/15/18  1500  cefTRIAXone (ROCEPHIN) 2 g in sodium chloride 0.9 % 100 mL IVPB  Status:  Discontinued     2 g 200 mL/hr over 30  Minutes Intravenous Every 24 hours 10/15/18 1434 10/15/18 1447   10/15/18 1500  ertapenem (INVANZ) 1,000 mg in sodium chloride 0.9 % 100 mL IVPB     1 g 200 mL/hr over 30 Minutes Intravenous Every 24 hours 10/15/18 1447 11/25/18 2359   10/08/18 0000  doxycycline (VIBRA-TABS) 100 MG tablet     100 mg Oral Every 12 hours 10/08/18 0921     10/06/18 1000  doxycycline (VIBRA-TABS) tablet 100 mg  Status:  Discontinued     100 mg Oral Every 12 hours 10/06/18 0726 10/11/18 1328   10/03/18 1800  metroNIDAZOLE (FLAGYL) tablet 500 mg  Status:  Discontinued     500 mg Oral Every 8 hours 10/03/18 1054 10/15/18 1447   10/03/18 0700  vancomycin (VANCOCIN) 2,000 mg in sodium chloride 0.9 % 500 mL IVPB  Status:  Discontinued     2,000 mg 250 mL/hr over 120 Minutes Intravenous Every 36 hours 10/01/18 1942 10/06/18 0726   10/02/18 0200  ceFEPIme (MAXIPIME) 2 g in sodium chloride 0.9 % 100 mL IVPB  Status:  Discontinued     2 g 200 mL/hr over 30 Minutes Intravenous Every 8 hours 10/01/18 1942 10/02/18 1008   10/01/18 1830  vancomycin (VANCOCIN) 2,500 mg in sodium chloride 0.9 % 500 mL IVPB     2,500 mg 250 mL/hr over 120 Minutes Intravenous STAT 10/01/18 1825 10/02/18 0126   10/01/18 1800  ceFEPIme (MAXIPIME) 2 g in sodium chloride 0.9 % 100 mL IVPB     2 g 200 mL/hr over 30 Minutes Intravenous  Once 10/01/18 1747 10/01/18 1903   10/01/18 1800  metroNIDAZOLE (FLAGYL) IVPB 500 mg  Status:  Discontinued     500 mg 100 mL/hr over 60 Minutes Intravenous Every 8 hours 10/01/18 1747 10/03/18 1054   10/01/18 1800  vancomycin (VANCOCIN) IVPB 1000 mg/200 mL premix  Status:  Discontinued     1,000 mg 200 mL/hr over 60 Minutes Intravenous  Once 10/01/18 1747 10/01/18 1825       Subjective: Patient seen and examined at bedside.  He is sleepy, wakes up on calling his name.  Complains of right shoulder pain.  Poor historian.  No overnight fever or vomiting reported. Objective: Vitals:   11/12/18 0953 11/12/18 1324  11/12/18 2110 11/13/18 0500  BP: 103/67 109/79 100/66 109/73  Pulse:  (!) 103 98 96  Resp:  17 17   Temp:  97.6 F (36.4 C) 98.8 F (37.1 C) (!) 97.5 F (36.4 C)  TempSrc:  Axillary Oral Oral  SpO2:  98% 97% 97%  Weight:      Height:        Intake/Output Summary (Last 24 hours) at 11/13/2018 1040 Last data filed at 11/13/2018 0937 Gross per 24 hour  Intake 490 ml  Output 2340 ml  Net -1850 ml   Filed Weights   11/10/18 0439 11/11/18 1810 11/12/18 0554  Weight: 118 kg 118.8 kg 118 kg    Examination:  General exam: Sleepy, wakes up only very slightly, hardly answers questions.  No distress chronically ill looking.    Respiratory system: Bilateral decreased breath sounds at bases, basilar crackles.   Cardiovascular system: S1 & S2 heard, Rate controlled Gastrointestinal system: Abdomen is slightly distended, soft and nontender. Normal bowel sounds heard. Extremities: No cyanosis; trace edema.  Data Reviewed: I have personally reviewed following labs and imaging studies  CBC: Recent Labs  Lab 11/08/18 0349 11/11/18 0430  WBC 7.2 7.3  NEUTROABS  --  4.2  HGB 11.0* 11.1*  HCT 35.6* 35.2*  MCV 90.6 89.6  PLT 247 480   Basic Metabolic Panel: Recent Labs  Lab 11/08/18 0349 11/11/18 0430  NA 137 138  K 4.2 3.9  CL 101 105  CO2 25 25  GLUCOSE 98 100*  BUN 38* 35*  CREATININE 1.05 0.99  CALCIUM 10.0 10.4*  MG 2.1 2.0   GFR: Estimated Creatinine Clearance: 84.1 mL/min (by C-G formula based on SCr of 0.99 mg/dL). Liver Function Tests: Recent Labs  Lab 11/11/18 0430  AST 22  ALT 20  ALKPHOS 90  BILITOT 0.5  PROT 6.5  ALBUMIN 2.7*   No results for input(s): LIPASE, AMYLASE in the last 168 hours. No results for input(s): AMMONIA in the last 168 hours. Coagulation Profile: No results for input(s): INR, PROTIME in the last 168 hours. Cardiac Enzymes: No results for input(s): CKTOTAL, CKMB, CKMBINDEX, TROPONINI in the last 168 hours. BNP (last 3  results) No results for input(s): PROBNP in the last 8760 hours. HbA1C: No results for input(s): HGBA1C in the last 72 hours. CBG: Recent Labs  Lab 11/08/18 0743 11/09/18 0811 11/10/18 0732 11/11/18 0854 11/13/18 0757  GLUCAP 96 86 97 96 85   Lipid Profile: No results for input(s): CHOL, HDL, LDLCALC, TRIG, CHOLHDL, LDLDIRECT in the last 72 hours. Thyroid Function Tests: No results for input(s): TSH, T4TOTAL, FREET4, T3FREE, THYROIDAB in the last 72 hours. Anemia Panel: No results for input(s): VITAMINB12, FOLATE, FERRITIN, TIBC, IRON, RETICCTPCT in the last 72 hours. Sepsis Labs: No results for input(s): PROCALCITON, LATICACIDVEN in the last 168 hours.  No results found for this or any previous visit (from the past 240 hour(s)).       Radiology Studies: No results found.      Scheduled Meds: . apixaban  5 mg Oral BID  . cycloSPORINE  1 drop Both Eyes BID  . diclofenac sodium  2 g Topical BID  . diltiazem  120 mg Oral Daily  . escitalopram  10 mg Oral Daily  . gabapentin  100 mg Oral TID  . lidocaine  1 patch Transdermal Q24H  . Melatonin  6 mg Oral QHS  . midodrine  10 mg Oral TID WC  . multivitamin with minerals  1 tablet Oral Daily  . pantoprazole  40 mg Oral Daily  . polyethylene glycol  17 g Oral Daily  . Ensure Max Protein  11 oz Oral BID  . senna-docusate  1 tablet Oral BID  . spironolactone  50 mg Oral Daily  . thiamine  100 mg Oral Daily   Continuous Infusions: . sodium chloride 250 mL (10/24/18 0052)  . ertapenem Stopped (11/12/18 2349)     LOS: 40 days        Aline August, MD [facility] 11/13/2018, 10:40 AM

## 2018-11-13 NOTE — Plan of Care (Signed)
  Problem: Education: Goal: Knowledge of General Education information will improve Description Including pain rating scale, medication(s)/side effects and non-pharmacologic comfort measures Outcome: Progressing   Problem: Health Behavior/Discharge Planning: Goal: Ability to manage health-related needs will improve Outcome: Progressing   Problem: Clinical Measurements: Goal: Ability to maintain clinical measurements within normal limits will improve Outcome: Progressing Goal: Will remain free from infection Outcome: Progressing Goal: Diagnostic test results will improve Outcome: Progressing Goal: Respiratory complications will improve Outcome: Progressing Goal: Cardiovascular complication will be avoided Outcome: Progressing   Problem: Activity: Goal: Risk for activity intolerance will decrease Outcome: Progressing   Problem: Coping: Goal: Level of anxiety will decrease Outcome: Progressing   Problem: Elimination: Goal: Will not experience complications related to bowel motility Outcome: Progressing Goal: Will not experience complications related to urinary retention Outcome: Progressing   Problem: Pain Managment: Goal: General experience of comfort will improve Outcome: Progressing   Problem: Safety: Goal: Ability to remain free from injury will improve Outcome: Progressing   Problem: Skin Integrity: Goal: Risk for impaired skin integrity will decrease Outcome: Progressing   Problem: Fluid Volume: Goal: Fluid volume balance will be maintained or improved Outcome: Progressing   Problem: Self-Concept: Goal: Body image disturbance will be avoided or minimized Outcome: Progressing   Problem: Urinary Elimination: Goal: Progression of disease will be identified and treated Outcome: Progressing   Problem: Education: Goal: Knowledge of General Education information will improve Description Including pain rating scale, medication(s)/side effects and  non-pharmacologic comfort measures Outcome: Progressing   Problem: Health Behavior/Discharge Planning: Goal: Ability to manage health-related needs will improve Outcome: Progressing   Problem: Clinical Measurements: Goal: Ability to maintain clinical measurements within normal limits will improve Outcome: Progressing Goal: Will remain free from infection Outcome: Progressing Goal: Diagnostic test results will improve Outcome: Progressing Goal: Respiratory complications will improve Outcome: Progressing Goal: Cardiovascular complication will be avoided Outcome: Progressing   Problem: Activity: Goal: Risk for activity intolerance will decrease Outcome: Progressing   Problem: Nutrition: Goal: Adequate nutrition will be maintained Outcome: Progressing   Problem: Coping: Goal: Level of anxiety will decrease Outcome: Progressing   Problem: Elimination: Goal: Will not experience complications related to bowel motility Outcome: Progressing Goal: Will not experience complications related to urinary retention Outcome: Progressing   Problem: Pain Managment: Goal: General experience of comfort will improve Outcome: Progressing   Problem: Safety: Goal: Ability to remain free from injury will improve Outcome: Progressing   Problem: Skin Integrity: Goal: Risk for impaired skin integrity will decrease Outcome: Progressing

## 2018-11-13 NOTE — Progress Notes (Signed)
Pt has home machine and is placing self on machine.  No issues noted at time of check.  Water in reservoir at level requested by patient.

## 2018-11-14 LAB — GLUCOSE, CAPILLARY: Glucose-Capillary: 95 mg/dL (ref 70–99)

## 2018-11-14 NOTE — Telephone Encounter (Signed)
Thanks Michelle

## 2018-11-14 NOTE — Progress Notes (Signed)
  Patient has home CPAP at bedside. RT added sterile H20 to machine.  Patient able to place himself on /off as needed.

## 2018-11-14 NOTE — Progress Notes (Signed)
Patient ID: [name], male   DOB: Jan 04, 1946, 73 y.o.   MRN: [id number]  PROGRESS NOTE    [name]  NID:[id number] DOB: [birth year] DOA: 10/01/2018 PCP: Frank Housekeeper, MD   Brief Narrative:  73 year old male with history of A. 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  He was found to have soft tissue infection of the left hip area, acute kidney injury, hyperkalemia.  He was initially hydrated, then developed volume overload for which she was diuresed with albumin and Lasix.  ID, orthopedics were consulted.  He underwent left hip aspiration on 10/15/2018.  ID recommended 6 weeks of IV Invanz.  He is awaiting placement.  Assessment & Plan:   Principal Problem:   Prosthetic joint infection of left hip (HCC) Active Problems:   Left hip postoperative wound infection   AKI (acute kidney injury) ([name])   Depression   Pleural effusion on left   OSA on CPAP   Unspecified atrial fibrillation (HCC)   Physical debility   Hyperkalemia   Other cirrhosis of liver (HCC)   Chronic back pain   Goals of care, counseling/discussion   Palliative care encounter  Left hip cellulitis with chronic left hip prosthetic joint infection in a patient with history of left total hip arthroplasty -Evaluated by ID and orthopedics.  Was on suppressive Flagyl for Bacteroides fragilis prior to presentation.  Status post left hip aspiration by IR on 10/15/2018.  ID is recommending 6 weeks of IV Invanz.  End date is November 25, 2018. -Patient will need outpatient follow-up with primary orthopedic surgeon/Dr. Ricki Cooper and ID  physician at [facility].  Acute kidney injury -Resolved.  Monitor electrolytes periodically.  Recurrent pleural effusion -Felt to be secondary to hepatic hydrothorax.  Most recent CT chest on 10/07/2018 showed a small pleural effusion.  Patient is a symptomatic at this point.  Doubt further work-up including thoracentesis will be required at this time.  Patient has had extensive recent work-up including multiple thoracentesis.  Cryptogenic cirrhosis -Prior.  Serology done earlier last month was negative  -Volume status is stable.  Blood pressure is on the lower side today.  Hold spironolactone dose today.  Might need to give some fluid.  Monitor.  Blood pressure is on the lower side; not on Lasix currently.  Chronic atrial fibrillation -Continue Eliquis.  Rate controlled.  Continue Cardizem.  Chronic back and right shoulder pain -Continue Lidoderm patch, Tylenol and as needed narcotics.    Bipolar disorder -Continue Lexapro, Neurontin  Constipation -Probably due to narcotic use.  Improved with cutting down narcotics.  Continue laxative/bowel regimen.  He frequently requests a disimpaction without any evidence of rectal impaction on x-ray, this was reported by several nurses recently.  Right shoulder pain -Patient has requested for cortisone shot.  Case was discussed with orthopedic/Frank Dellis Filbert, PA with orthopedics which stated that orthopedic service does not do inpatient cortisone shots. -Continue Lidoderm patch and NSAID cream  OSA--continue CPAP nightly   Chronic deconditioning and debility -Waiting for SNF placement.  DVT prophylaxis: Eliquis Code Status: Full Family Communication: None at bedside Disposition Plan: SNF once bed is available  Consultants: Orthopedic/ID/IR  Procedures: Left hip aspiration by IR on 10/15/2018  Antimicrobials:  Anti-infectives (From admission, onward)   Start     Dose/Rate Route Frequency Ordered Stop   10/15/18 1500  cefTRIAXone  (ROCEPHIN) 2 g in sodium chloride 0.9 % 100 mL IVPB  Status:  Discontinued     2 g 200 mL/hr over 30 Minutes Intravenous Every 24 hours 10/15/18 1434 10/15/18 1447   10/15/18 1500  ertapenem (INVANZ) 1,000 mg in sodium chloride 0.9 % 100 mL IVPB     1 g 200 mL/hr over 30 Minutes Intravenous Every 24 hours 10/15/18 1447 11/25/18 2359   10/08/18 0000  doxycycline (VIBRA-TABS) 100 MG tablet     100 mg Oral Every 12 hours 10/08/18 0921     10/06/18 1000  doxycycline (VIBRA-TABS) tablet 100 mg  Status:  Discontinued     100 mg Oral Every 12 hours 10/06/18 0726 10/11/18 1328   10/03/18 1800  metroNIDAZOLE (FLAGYL) tablet 500 mg  Status:  Discontinued     500 mg Oral Every 8 hours 10/03/18 1054 10/15/18 1447   10/03/18 0700  vancomycin (VANCOCIN) 2,000 mg in sodium chloride 0.9 % 500 mL IVPB  Status:  Discontinued     2,000 mg 250 mL/hr over 120 Minutes Intravenous Every 36 hours 10/01/18 1942 10/06/18 0726   10/02/18 0200  ceFEPIme (MAXIPIME) 2 g in sodium chloride 0.9 % 100 mL IVPB  Status:  Discontinued     2 g 200 mL/hr over 30 Minutes Intravenous Every 8 hours 10/01/18 1942 10/02/18 1008   10/01/18 1830  vancomycin (VANCOCIN) 2,500 mg in sodium chloride 0.9 % 500 mL IVPB     2,500 mg 250 mL/hr over 120 Minutes Intravenous STAT 10/01/18 1825 10/02/18 0126   10/01/18 1800  ceFEPIme (MAXIPIME) 2 g in sodium chloride 0.9 % 100 mL IVPB     2 g 200 mL/hr over 30 Minutes Intravenous  Once 10/01/18 1747 10/01/18 1903   10/01/18 1800  metroNIDAZOLE (FLAGYL) IVPB 500 mg  Status:  Discontinued     500 mg 100 mL/hr over 60 Minutes Intravenous Every 8 hours 10/01/18 1747 10/03/18 1054   10/01/18 1800  vancomycin (VANCOCIN) IVPB 1000 mg/200 mL premix  Status:  Discontinued     1,000 mg 200 mL/hr over 60 Minutes Intravenous  Once 10/01/18 1747 10/01/18 1825        Subjective: Patient seen and examined at bedside.  He is awake, poor historian.  Complains of anxiety and not being able to sleep; also  complains of right shoulder pain.  No overnight fever or vomiting.   Objective: Vitals:   11/13/18 1349 11/13/18 2215 11/14/18 0520 11/14/18 0927  BP: 116/79 97/70 116/72 (!) 85/72  Pulse: 88 99 91 (!) 106  Resp: 18 18 18    Temp: 97.6 F (36.4 C) 98.3 F (36.8 C) 97.6 F (36.4 C) (!) 97.3 F (36.3 C)  TempSrc: Oral Oral Oral Oral  SpO2: 99% 95% 95% 96%  Weight:   116.1 kg   Height:        Intake/Output Summary (Last 24 hours) at 11/14/2018 1001 Last data filed at 11/14/2018 0600 Gross per 24 hour  Intake 658.73 ml  Output 2575 ml  Net -1916.27 ml   Filed Weights   11/11/18 1810 11/12/18 0554 11/14/18 0520  Weight: 118.8 kg 118 kg 116.1 kg    Examination:  General exam: Awake, answers some questions.  No distress; chronically ill looking.    Respiratory system: Bilateral decreased breath sounds at bases, basilar crackles.  No wheezing Cardiovascular system: Rate controlled,  S1 & S2 heard Gastrointestinal system: Abdomen is slightly distended, soft and nontender. Normal bowel sounds heard. Extremities: No cyanosis; trace edema.     Data Reviewed: I have personally reviewed following labs and imaging studies  CBC: Recent Labs  Lab 11/08/18 0349 11/11/18 0430  WBC 7.2 7.3  NEUTROABS  --  4.2  HGB 11.0* 11.1*  HCT 35.6* 35.2*  MCV 90.6 89.6  PLT 247 320   Basic Metabolic Panel: Recent Labs  Lab 11/08/18 0349 11/11/18 0430  NA 137 138  K 4.2 3.9  CL 101 105  CO2 25 25  GLUCOSE 98 100*  BUN 38* 35*  CREATININE 1.05 0.99  CALCIUM 10.0 10.4*  MG 2.1 2.0   GFR: Estimated Creatinine Clearance: 83.5 mL/min (by C-G formula based on SCr of 0.99 mg/dL). Liver Function Tests: Recent Labs  Lab 11/11/18 0430  AST 22  ALT 20  ALKPHOS 90  BILITOT 0.5  PROT 6.5  ALBUMIN 2.7*   No results for input(s): LIPASE, AMYLASE in the last 168 hours. No results for input(s): AMMONIA in the last 168 hours. Coagulation Profile: No results for input(s): INR, PROTIME in  the last 168 hours. Cardiac Enzymes: No results for input(s): CKTOTAL, CKMB, CKMBINDEX, TROPONINI in the last 168 hours. BNP (last 3 results) No results for input(s): PROBNP in the last 8760 hours. HbA1C: No results for input(s): HGBA1C in the last 72 hours. CBG: Recent Labs  Lab 11/09/18 0811 11/10/18 0732 11/11/18 0854 11/13/18 0757 11/14/18 0742  GLUCAP 86 97 96 85 95   Lipid Profile: No results for input(s): CHOL, HDL, LDLCALC, TRIG, CHOLHDL, LDLDIRECT in the last 72 hours. Thyroid Function Tests: No results for input(s): TSH, T4TOTAL, FREET4, T3FREE, THYROIDAB in the last 72 hours. Anemia Panel: No results for input(s): VITAMINB12, FOLATE, FERRITIN, TIBC, IRON, RETICCTPCT in the last 72 hours. Sepsis Labs: No results for input(s): PROCALCITON, LATICACIDVEN in the last 168 hours.  No results found for this or any previous visit (from the past 240 hour(s)).       Radiology Studies: No results found.      Scheduled Meds: . apixaban  5 mg Oral BID  . cycloSPORINE  1 drop Both Eyes BID  . diclofenac sodium  2 g Topical BID  . diltiazem  120 mg Oral Daily  . escitalopram  10 mg Oral Daily  . gabapentin  100 mg Oral TID  . lidocaine  1 patch Transdermal Q24H  . Melatonin  6 mg Oral QHS  . midodrine  10 mg Oral TID WC  . multivitamin with minerals  1 tablet Oral Daily  . pantoprazole  40 mg Oral Daily  . polyethylene glycol  17 g Oral Daily  . Ensure Max Protein  11 oz Oral BID  . senna-docusate  1 tablet Oral BID  . spironolactone  50 mg Oral Daily  . thiamine  100 mg Oral Daily   Continuous Infusions: . sodium chloride 10 mL/hr at 11/14/18 0600  . ertapenem 1,000 mg (11/13/18 1432)     LOS: 41 days        Aline August, MD [facility] 11/14/2018, 10:01 AM

## 2018-11-15 LAB — CBC WITH DIFFERENTIAL/PLATELET
Abs Immature Granulocytes: 0.02 10*3/uL (ref 0.00–0.07)
Basophils Absolute: 0 10*3/uL (ref 0.0–0.1)
Basophils Relative: 1 %
Eosinophils Absolute: 0.3 10*3/uL (ref 0.0–0.5)
Eosinophils Relative: 4 %
HCT: 36.1 % — ABNORMAL LOW (ref 39.0–52.0)
Hemoglobin: 11.7 g/dL — ABNORMAL LOW (ref 13.0–17.0)
Immature Granulocytes: 0 %
Lymphocytes Relative: 24 %
Lymphs Abs: 1.8 10*3/uL (ref 0.7–4.0)
MCH: 28.9 pg (ref 26.0–34.0)
MCHC: 32.4 g/dL (ref 30.0–36.0)
MCV: 89.1 fL (ref 80.0–100.0)
Monocytes Absolute: 1 10*3/uL (ref 0.1–1.0)
Monocytes Relative: 12 %
Neutro Abs: 4.6 10*3/uL (ref 1.7–7.7)
Neutrophils Relative %: 59 %
Platelets: 249 10*3/uL (ref 150–400)
RBC: 4.05 MIL/uL — ABNORMAL LOW (ref 4.22–5.81)
RDW: 16.3 % — ABNORMAL HIGH (ref 11.5–15.5)
WBC: 7.7 10*3/uL (ref 4.0–10.5)
nRBC: 0 % (ref 0.0–0.2)

## 2018-11-15 LAB — GLUCOSE, CAPILLARY: Glucose-Capillary: 89 mg/dL (ref 70–99)

## 2018-11-15 LAB — COMPREHENSIVE METABOLIC PANEL
ALT: 23 U/L (ref 0–44)
AST: 24 U/L (ref 15–41)
Albumin: 2.8 g/dL — ABNORMAL LOW (ref 3.5–5.0)
Alkaline Phosphatase: 97 U/L (ref 38–126)
Anion gap: 7 (ref 5–15)
BUN: 33 mg/dL — ABNORMAL HIGH (ref 8–23)
CO2: 25 mmol/L (ref 22–32)
Calcium: 10.7 mg/dL — ABNORMAL HIGH (ref 8.9–10.3)
Chloride: 105 mmol/L (ref 98–111)
Creatinine, Ser: 0.99 mg/dL (ref 0.61–1.24)
GFR calc Af Amer: >60 mL/min (ref >60)
GFR calc non Af Amer: >60 mL/min (ref >60)
Glucose, Bld: 103 mg/dL — ABNORMAL HIGH (ref 70–99)
Potassium: 4.2 mmol/L (ref 3.5–5.1)
Sodium: 137 mmol/L (ref 135–145)
Total Bilirubin: 0.4 mg/dL (ref 0.3–1.2)
Total Protein: 6.5 g/dL (ref 6.5–8.1)

## 2018-11-15 LAB — MAGNESIUM: Magnesium: 2.2 mg/dL (ref 1.7–2.4)

## 2018-11-15 MED ORDER — SENNOSIDES-DOCUSATE SODIUM 8.6-50 MG PO TABS
2.0000 | ORAL_TABLET | Freq: Two times a day (BID) | ORAL | Status: DC
  Administered 2018-11-15 – 2018-12-06 (×40): 2 via ORAL
  Filled 2018-11-15 (×46): qty 2

## 2018-11-15 NOTE — Progress Notes (Signed)
Physical Therapy Treatment Patient Details Name: [name] MRN: [id number] DOB: 11-04-1945 Today's Date: 11/15/2018    History of Present Illness 73 y.o. male admitted 10/01/18 with generalized weakness, falls and SOB. Workup revealed afib with recurrent R-side pleural effusion; AKI. Pt with L hip cellulitis with chronic L hip prosthetic joint infection; s/p joint aspiration 2/14. PMH includes HTN, afib on Eliquis, OSA, bipolar disorder, obesity, L THA with infection and multiple prolonged hospitalizations.    PT Comments    Goals updated and reviewed.  Pt remains two person mod assist to stand EOB with total assist maxi move lift for safe OOB to chair.  He remains appropriate for SNF placement at discharge.  He is motivated to move and get better.  He is tired of being in the hospital.  Try the sara plus electric standing frame next session.  PT will continue to follow acutely for safe mobility progression  Follow Up Recommendations  SNF     Equipment Recommendations  Wheelchair (measurements PT);Wheelchair cushion (measurements PT);Hospital bed;Other (comment)(hoyer lift)    Recommendations for Other Services   NA     Precautions / Restrictions Precautions Precautions: Fall;Posterior Hip Precaution Booklet Issued: No Precaution Comments: h/o falls, very weak, per pt report posterior hip precautions. Restrictions Weight Bearing Restrictions: No    Mobility  Bed Mobility Overal bed mobility: Needs Assistance Bed Mobility: Supine to Sit     Supine to sit: Mod assist;HOB elevated Sit to supine: Mod assist   General bed mobility comments: Mod assist to support trunk to come to sitting EOB.  Pt needed extra time and use of railing for leverage to move bil LEs and hips around to EOB.   Transfers Overall transfer level: Needs assistance Equipment used: Rolling walker (2 wheeled) Transfers: Sit to/from Stand Sit to Stand: Mod assist;+2 physical assistance;From elevated  surface         General transfer comment: Two person mod assist from maximally elevated bed to RW.  Pt needed help powering up to stand and ensuring he sat far enough back on the bed when he returned to sitting.          Balance Overall balance assessment: Needs assistance Sitting-balance support: Feet supported;Bilateral upper extremity supported Sitting balance-[name]: Poor Sitting balance - Comments: mod assist to sit without something for him to hold onto (ie bed rails or RW).  Once out far enough with bed elevated and RW in front of him, he is able to lean on the RW and can be supervision.    Standing balance support: Bilateral upper extremity supported Standing balance-[name]: Poor Standing balance comment: two person mod assist to stand from maximally elevated bed to RW and mod assist once standing.  Pt only able to stand ~ 10 seconds reporting bil knee pain as his reason for not standing longer.  He refused to attempt a second time.                             Cognition Arousal/Alertness: Awake/alert Behavior During Therapy: WFL for tasks assessed/performed Overall Cognitive Status: Within Functional Limits for tasks assessed                                        Exercises General Exercises - Lower Extremity Ankle Circles/Pumps: AROM;Both;20 reps Long Arc Quad: AROM;Both;10 reps  Pertinent Vitals/Pain Pain Assessment: Faces Faces Pain Scale: Hurts even more Pain Location: R shoulder, back, L knee all chronic arthritic pain Pain Descriptors / Indicators: Grimacing;Guarding Pain Intervention(s): Limited activity within patient's tolerance;Monitored during session;Repositioned       Prior Function            PT Goals (current goals can now be found in the care plan section) Acute Rehab PT Goals Patient Stated Goal: to get stronger and get home, stop being hospitalized so much PT Goal Formulation: With patient Time  For Goal Achievement: 11/29/18 Potential to Achieve Goals: Fair Progress towards PT goals: Progressing toward goals(gaols updated)    Frequency    Min 3X/week      PT Plan Current plan remains appropriate       AM-PAC PT "6 Clicks" Mobility   Outcome Measure  Help needed turning from your back to your side while in a flat bed without using bedrails?: A Lot Help needed moving from lying on your back to sitting on the side of a flat bed without using bedrails?: A Lot Help needed moving to and from a bed to a chair (including a wheelchair)?: Total Help needed standing up from a chair using your arms (e.g., wheelchair or bedside chair)?: A Lot Help needed to walk in hospital room?: Total Help needed climbing 3-5 steps with a railing? : Total 6 Click Score: 9    End of Session Equipment Utilized During Treatment: Gait belt Activity Tolerance: Patient limited by pain;Patient limited by fatigue Patient left: in chair;with call bell/phone within reach Nurse Communication: Mobility status;Need for lift equipment PT Visit Diagnosis: Other abnormalities of gait and mobility (R26.89);Muscle weakness (generalized) (M62.81);Difficulty in walking, not elsewhere classified (R26.2);History of falling (Z91.81)     Time: 8466-5993 PT Time Calculation (min) (ACUTE ONLY): 52 min  Charges:  $Therapeutic Activity: 38-52 mins                    Kwinton Maahs B. Lorenza Shakir, PT, DPT  Acute Rehabilitation [phone] pager #[phone] office   11/15/2018, 2:57 PM

## 2018-11-15 NOTE — Progress Notes (Signed)
Patient ID: [name], male   DOB: [birth year], 73 y.o.   MRN: [id number]  PROGRESS NOTE    [name]  GHW:[id number] DOB: April 04, 1946 DOA: 10/01/2018 PCP: [name], MD   Brief Narrative:  73 year old male with history of A. 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  He was found to have soft tissue infection of the left hip area, acute kidney injury, hyperkalemia.  He was initially hydrated, then developed volume overload for which she was diuresed with albumin and Lasix.  ID, orthopedics were consulted.  He underwent left hip aspiration on 10/15/2018.  ID recommended 6 weeks of IV Invanz.  He is awaiting placement.  Assessment & Plan:   Principal Problem:   Prosthetic joint infection of left hip (HCC) Active Problems:   Left hip postoperative wound infection   AKI (acute kidney injury) ([place])   Depression   Pleural effusion on left   OSA on CPAP   Unspecified atrial fibrillation (HCC)   Physical debility   Hyperkalemia   Other cirrhosis of liver (HCC)   Chronic back pain   Goals of care, counseling/discussion   Palliative care encounter  Left hip cellulitis with chronic left hip prosthetic joint infection in a patient with history of left total hip arthroplasty -Evaluated by ID and orthopedics.  Was on suppressive Flagyl for Bacteroides fragilis prior to presentation.  Status post left hip aspiration by IR on 10/15/2018.  ID is recommending 6 weeks of IV Invanz.  End date is November 25, 2018. -Patient will need outpatient follow-up with primary orthopedic surgeon/Dr. Ricki Rodriguez and ID  physician at [facility].  Acute kidney injury -Resolved.  Monitor electrolytes periodically.  Recurrent pleural effusion -Felt to be secondary to hepatic hydrothorax.  Most recent CT chest on 10/07/2018 showed a small pleural effusion.  Patient is a symptomatic at this point.  Doubt further work-up including thoracentesis will be required at this time.  Patient has had extensive recent work-up including multiple thoracentesis.  Cryptogenic cirrhosis -Prior.  Serology done earlier last month was negative  -Volume status is stable.  Continue spironolactone if blood pressure allows.  Monitor.  Blood pressure is on the lower side; not on Lasix currently.  Chronic atrial fibrillation -Continue Eliquis.  Rate controlled.  Continue Cardizem.  Chronic back and right shoulder pain -Continue Lidoderm patch, Tylenol and as needed narcotics.    Bipolar disorder -Continue Lexapro, Neurontin  Constipation -Probably due to narcotic use.  Improved with cutting down narcotics.  Continue laxative/bowel regimen.  He frequently requests a disimpaction without any evidence of rectal impaction on x-ray, this was reported by several nurses recently.  Right shoulder pain -Patient has requested for cortisone shot.  Case was discussed with orthopedic/[name], PA with orthopedics which stated that orthopedic service does not do inpatient cortisone shots. -Continue Lidoderm patch and NSAID cream  OSA--continue CPAP nightly   Chronic deconditioning and debility -Waiting for SNF placement.  DVT prophylaxis: Eliquis Code Status: Full Family Communication: None at bedside Disposition Plan: SNF once bed is available  Consultants: Orthopedic/ID/IR  Procedures: Left hip aspiration by IR on 10/15/2018  Antimicrobials:      Subjective: Patient seen and examined at bedside.  He  is sleepy, wakes up very minimally.  Poor historian.  No overnight fever or vomiting reported.  The nursing staff reports that  patient had asked for enema again yesterday although he had bowel movement yesterday.  Objective: Vitals:   11/14/18 1723 11/14/18 2128 11/15/18 0539 11/15/18 0905  BP: 114/70 97/78 105/72 110/73  Pulse: 87 90 (!) 104   Resp:      Temp:  98.1 F (36.7 C) (!) 97.5 F (36.4 C) 97.8 F (36.6 C)  TempSrc:  Oral Oral Oral  SpO2:  97% 96%   Weight:   116.9 kg   Height:        Intake/Output Summary (Last 24 hours) at 11/15/2018 0948 Last data filed at 11/15/2018 0700 Gross per 24 hour  Intake 324.47 ml  Output 2300 ml  Net -1975.53 ml   Filed Weights   11/12/18 0554 11/14/18 0520 11/15/18 0539  Weight: 118 kg 116.1 kg 116.9 kg    Examination:  General exam: Sleepy, wakes up only very slightly, hardly answers questions.  No distress; chronically ill looking.    Respiratory system: Bilateral decreased breath sounds at bases, basilar crackles.  No wheezing Cardiovascular system: Slightly tachycardic, S1 & S2 heard Gastrointestinal system: Abdomen is slightly distended, soft and nontender. Normal bowel sounds heard. Extremities: No cyanosis; trace edema.     Data Reviewed: I have personally reviewed following labs and imaging studies  CBC: Recent Labs  Lab 11/11/18 0430 11/15/18 0330  WBC 7.3 7.7  NEUTROABS 4.2 4.6  HGB 11.1* 11.7*  HCT 35.2* 36.1*  MCV 89.6 89.1  PLT 255 539   Basic Metabolic Panel: Recent Labs  Lab 11/11/18 0430 11/15/18 0330  NA 138 137  K 3.9 4.2  CL 105 105  CO2 25 25  GLUCOSE 100* 103*  BUN 35* 33*  CREATININE 0.99 0.99  CALCIUM 10.4* 10.7*  MG 2.0 2.2   GFR: Estimated Creatinine Clearance: 83.8 mL/min (by C-G formula based on SCr of 0.99 mg/dL). Liver Function Tests: Recent Labs  Lab 11/11/18 0430 11/15/18 0330  AST 22 24  ALT 20 23  ALKPHOS 90 97  BILITOT 0.5 0.4  PROT 6.5 6.5  ALBUMIN 2.7* 2.8*   No results for input(s): LIPASE, AMYLASE in the last 168 hours. No results for input(s): AMMONIA in the last 168  hours. Coagulation Profile: No results for input(s): INR, PROTIME in the last 168 hours. Cardiac Enzymes: No results for input(s): CKTOTAL, CKMB, CKMBINDEX, TROPONINI in the last 168 hours. BNP (last 3 results) No results for input(s): PROBNP in the last 8760 hours. HbA1C: No results for input(s): HGBA1C in the last 72 hours. CBG: Recent Labs  Lab 11/10/18 0732 11/11/18 0854 11/13/18 0757 11/14/18 0742 11/15/18 0742  GLUCAP 97 96 85 95 89   Lipid Profile: No results for input(s): CHOL, HDL, LDLCALC, TRIG, CHOLHDL, LDLDIRECT in the last 72 hours. Thyroid Function Tests: No results for input(s): TSH, T4TOTAL, FREET4, T3FREE, THYROIDAB in the last 72 hours. Anemia Panel: No results for input(s): VITAMINB12, FOLATE, FERRITIN, TIBC, IRON, RETICCTPCT in the last 72 hours. Sepsis Labs: No results for input(s): PROCALCITON, LATICACIDVEN in the last 168 hours.  No results found for this or any previous visit (from the past 240 hour(s)).       Radiology Studies: No results found.      Scheduled Meds: . apixaban  5 mg Oral BID  . cycloSPORINE  1 drop Both Eyes BID  . diclofenac sodium  2 g Topical BID  .  diltiazem  120 mg Oral Daily  . escitalopram  10 mg Oral Daily  . gabapentin  100 mg Oral TID  . lidocaine  1 patch Transdermal Q24H  . Melatonin  6 mg Oral QHS  . midodrine  10 mg Oral TID WC  . multivitamin with minerals  1 tablet Oral Daily  . pantoprazole  40 mg Oral Daily  . polyethylene glycol  17 g Oral Daily  . Ensure Max Protein  11 oz Oral BID  . senna-docusate  1 tablet Oral BID  . spironolactone  50 mg Oral Daily  . thiamine  100 mg Oral Daily   Continuous Infusions: . sodium chloride 10 mL/hr at 11/15/18 0500  . ertapenem Stopped (11/14/18 1521)     LOS: 42 days        Aline August, MD [facility] 11/15/2018, 9:48 AM

## 2018-11-15 NOTE — TOC Progression Note (Signed)
Transition of Care [facility]) - Progression Note    Patient Details  Name: TUFF CLABO MRN: [id number] Date of Birth: 1946-05-31  Transition of Care [facility]) CM/SW [place], [place] Phone Number: [phone] 11/15/2018, 10:01 AM  Clinical Narrative:     Patient remains on [facility] waitlist.   Expected Discharge Plan: [place]    Expected Discharge Plan and Services Expected Discharge Plan: [place] Discharge Planning Services: CM Consult     Expected Discharge Date: 10/08/18                         Social Determinants of Health (SDOH) Interventions    Readmission Risk Interventions 30 Day Unplanned Readmission Risk Score     ED to Hosp-Admission (Current) from 10/01/2018 in [place]  30 Day Unplanned Readmission Risk Score (%)  56 Filed at 11/15/2018 0801     This score is the patient's risk of an unplanned readmission within 30 days of being discharged (0 -100%). The score is based on dignosis, age, lab data, medications, orders, and past utilization.   Low:  0-14.9   Medium: 15-21.9   High: 22-29.9   Extreme: 30 and above       Readmission Risk Prevention Plan 09/11/2018  Transportation Screening Complete  Medication Review Press photographer) Complete  HRI or Home Care Consult Complete  Some recent data might be hidden

## 2018-11-16 MED ORDER — POLYETHYLENE GLYCOL 3350 17 G PO PACK
17.0000 g | PACK | Freq: Two times a day (BID) | ORAL | Status: DC
  Administered 2018-11-17 – 2018-12-05 (×23): 17 g via ORAL
  Filled 2018-11-16 (×40): qty 1

## 2018-11-16 NOTE — Progress Notes (Signed)
Patient has home CPAP unit at bedside Able to place himself on/off as needed. Sterile water added to chamber for humidity.

## 2018-11-16 NOTE — Progress Notes (Signed)
Patient ID: [name], male   DOB: 23-Jun-1946, 73 y.o.   MRN: [id number]  PROGRESS NOTE    [name]  EXN:[id number] DOB: Jan 10, 1946 DOA: 10/01/2018 PCP: [name], [name]   Brief Narrative:  73 year old male with history of A. 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  He was found to have soft tissue infection of the left hip area, acute kidney injury, hyperkalemia.  He was initially hydrated, then developed volume overload for which she was diuresed with albumin and Lasix.  ID, orthopedics were consulted.  He underwent left hip aspiration on 10/15/2018.  ID recommended 6 weeks of IV Invanz.  He is awaiting placement.  Assessment & Plan:   Principal Problem:   Prosthetic joint infection of left hip (HCC) Active Problems:   Left hip postoperative wound infection   AKI (acute kidney injury) ([place])   Depression   Pleural effusion on left   OSA on CPAP   Unspecified atrial fibrillation (HCC)   Physical debility   Hyperkalemia   Other cirrhosis of liver (HCC)   Chronic back pain   Goals of care, counseling/discussion   Palliative care encounter  Left hip cellulitis with chronic left hip prosthetic joint infection in a patient with history of left total hip arthroplasty -Evaluated by ID and orthopedics.  Was on suppressive Flagyl for Bacteroides fragilis prior to presentation.  Status post left hip aspiration by IR on 10/15/2018.  ID is recommending 6 weeks of IV Invanz.  End date is November 25, 2018. -Patient will need outpatient follow-up with primary orthopedic surgeon/Dr. Ricki Cooper and ID  physician at [facility].  Acute kidney injury -Resolved.  Monitor electrolytes periodically.  Recurrent pleural effusion -Felt to be secondary to hepatic hydrothorax.  Most recent CT chest on 10/07/2018 showed a small pleural effusion.  Patient is a symptomatic at this point.  Doubt further work-up including thoracentesis will be required at this time.  Patient has had extensive recent work-up including multiple thoracentesis.  Cryptogenic cirrhosis -Prior.  Serology done earlier last month was negative  -Volume status is stable.  Continue spironolactone if blood pressure allows.  Monitor.  Blood pressure is on the lower side; not on Lasix currently.  Chronic atrial fibrillation -Continue Eliquis.  Rate controlled.  Continue Cardizem.  Chronic back and right shoulder pain -Continue Lidoderm patch, Tylenol and as needed narcotics.    Bipolar disorder -Continue Lexapro, Neurontin  Constipation -Probably due to narcotic use.  Improved with cutting down narcotics.  Continue laxative/bowel regimen.  He frequently requests a disimpaction without any evidence of rectal impaction on x-ray, this was reported by several nurses recently.  Right shoulder pain -Patient has requested for cortisone shot.  Case was discussed with orthopedic/Frank Dellis Filbert, PA with orthopedics which stated that orthopedic service does not do inpatient cortisone shots. -Continue Lidoderm patch and NSAID cream  OSA--continue CPAP nightly   Chronic deconditioning and debility -Waiting for SNF placement.  DVT prophylaxis: Eliquis Code Status: Full Family Communication: None at bedside Disposition Plan: SNF once bed is available  Consultants: Orthopedic/ID/IR  Procedures: Left hip aspiration by IR on 10/15/2018  Antimicrobials:      Subjective: Patient seen and examined at bedside.  He  is sleepy, wakes up very minimally.  Does not answer most questions.  Poor historian.  No overnight fever, vomiting reported by  nursing staff. Objective: Vitals:   11/15/18 2128 11/15/18 2129 11/16/18 0500 11/16/18 0531  BP: 107/63   124/73  Pulse: (!) 104   100  Resp:      Temp:  (!) 97.5 F (36.4 C)  97.6 F (36.4 C)  TempSrc:  Oral  Oral  SpO2: 95%   95%  Weight:   117.5 kg   Height:        Intake/Output Summary (Last 24 hours) at 11/16/2018 0944 Last data filed at 11/16/2018 0648 Gross per 24 hour  Intake 1939.56 ml  Output 2250 ml  Net -310.44 ml   Filed Weights   11/14/18 0520 11/15/18 0539 11/16/18 0500  Weight: 116.1 kg 116.9 kg 117.5 kg    Examination:  General exam: Sleepy, wakes up only very slightly, does not answer any questions.  Poor historian.  Chronically ill looking  respiratory system: Bilateral decreased breath sounds at bases, basilar crackles. Cardiovascular system: S1-S2 heard, slightly tachycardic Gastrointestinal system: Abdomen is slightly distended, soft and nontender. Normal bowel sounds heard. Extremities: No cyanosis; trace edema.     Data Reviewed: I have personally reviewed following labs and imaging studies  CBC: Recent Labs  Lab 11/11/18 0430 11/15/18 0330  WBC 7.3 7.7  NEUTROABS 4.2 4.6  HGB 11.1* 11.7*  HCT 35.2* 36.1*  MCV 89.6 89.1  PLT 255 400   Basic Metabolic Panel: Recent Labs  Lab 11/11/18 0430 11/15/18 0330  NA 138 137  K 3.9 4.2  CL 105 105  CO2 25 25  GLUCOSE 100* 103*  BUN 35* 33*  CREATININE 0.99 0.99  CALCIUM 10.4* 10.7*  MG 2.0 2.2   GFR: Estimated Creatinine Clearance: 84 mL/min (by C-G formula based on SCr of 0.99 mg/dL). Liver Function Tests: Recent Labs  Lab 11/11/18 0430 11/15/18 0330  AST 22 24  ALT 20 23  ALKPHOS 90 97  BILITOT 0.5 0.4  PROT 6.5 6.5  ALBUMIN 2.7* 2.8*   No results for input(s): LIPASE, AMYLASE in the last 168 hours. No results for input(s): AMMONIA in the last 168 hours. Coagulation Profile: No results for input(s): INR, PROTIME in the last 168 hours. Cardiac Enzymes: No results for  input(s): CKTOTAL, CKMB, CKMBINDEX, TROPONINI in the last 168 hours. BNP (last 3 results) No results for input(s): PROBNP in the last 8760 hours. HbA1C: No results for input(s): HGBA1C in the last 72 hours. CBG: Recent Labs  Lab 11/10/18 0732 11/11/18 0854 11/13/18 0757 11/14/18 0742 11/15/18 0742  GLUCAP 97 96 85 95 89   Lipid Profile: No results for input(s): CHOL, HDL, LDLCALC, TRIG, CHOLHDL, LDLDIRECT in the last 72 hours. Thyroid Function Tests: No results for input(s): TSH, T4TOTAL, FREET4, T3FREE, THYROIDAB in the last 72 hours. Anemia Panel: No results for input(s): VITAMINB12, FOLATE, FERRITIN, TIBC, IRON, RETICCTPCT in the last 72 hours. Sepsis Labs: No results for input(s): PROCALCITON, LATICACIDVEN in the last 168 hours.  No results found for this or any previous visit (from the past 240 hour(s)).       Radiology Studies: No results found.      Scheduled Meds: . apixaban  5 mg Oral BID  . cycloSPORINE  1 drop Both Eyes BID  . diclofenac sodium  2 g Topical BID  . diltiazem  120 mg Oral Daily  . escitalopram  10 mg Oral Daily  . gabapentin  100 mg Oral TID  . lidocaine  1 patch Transdermal Q24H  . Melatonin  6 mg Oral QHS  . midodrine  10 mg Oral TID WC  . multivitamin with minerals  1 tablet Oral Daily  . pantoprazole  40 mg Oral Daily  . polyethylene glycol  17 g Oral Daily  . Ensure Max Protein  11 oz Oral BID  . senna-docusate  2 tablet Oral BID  . spironolactone  50 mg Oral Daily  . thiamine  100 mg Oral Daily   Continuous Infusions: . sodium chloride 10 mL/hr at 11/15/18 1649  . ertapenem Stopped (11/15/18 1617)     LOS: 73 days        Frank August, [name] [facility] 11/16/2018, 9:44 AM

## 2018-11-17 ENCOUNTER — Inpatient Hospital Stay: Payer: Medicare Other | Admitting: Infectious Disease

## 2018-11-17 DIAGNOSIS — M25551 Pain in right hip: Secondary | ICD-10-CM

## 2018-11-17 DIAGNOSIS — L03116 Cellulitis of left lower limb: Secondary | ICD-10-CM

## 2018-11-17 LAB — BASIC METABOLIC PANEL
Anion gap: 6 (ref 5–15)
BUN: 39 mg/dL — ABNORMAL HIGH (ref 8–23)
CO2: 23 mmol/L (ref 22–32)
Calcium: 10 mg/dL (ref 8.9–10.3)
Chloride: 106 mmol/L (ref 98–111)
Creatinine, Ser: 1.07 mg/dL (ref 0.61–1.24)
GFR calc Af Amer: >60 mL/min (ref >60)
GFR calc non Af Amer: >60 mL/min (ref >60)
Glucose, Bld: 100 mg/dL — ABNORMAL HIGH (ref 70–99)
Potassium: 4 mmol/L (ref 3.5–5.1)
Sodium: 135 mmol/L (ref 135–145)

## 2018-11-17 LAB — CBC WITH DIFFERENTIAL/PLATELET
Abs Immature Granulocytes: 0.02 10*3/uL (ref 0.00–0.07)
Basophils Absolute: 0 10*3/uL (ref 0.0–0.1)
Basophils Relative: 1 %
Eosinophils Absolute: 0.3 10*3/uL (ref 0.0–0.5)
Eosinophils Relative: 4 %
HCT: 36.9 % — ABNORMAL LOW (ref 39.0–52.0)
Hemoglobin: 11.4 g/dL — ABNORMAL LOW (ref 13.0–17.0)
Immature Granulocytes: 0 %
Lymphocytes Relative: 28 %
Lymphs Abs: 1.9 10*3/uL (ref 0.7–4.0)
MCH: 27.7 pg (ref 26.0–34.0)
MCHC: 30.9 g/dL (ref 30.0–36.0)
MCV: 89.6 fL (ref 80.0–100.0)
Monocytes Absolute: 0.9 10*3/uL (ref 0.1–1.0)
Monocytes Relative: 13 %
Neutro Abs: 3.7 10*3/uL (ref 1.7–7.7)
Neutrophils Relative %: 54 %
Platelets: 255 10*3/uL (ref 150–400)
RBC: 4.12 MIL/uL — ABNORMAL LOW (ref 4.22–5.81)
RDW: 15.9 % — ABNORMAL HIGH (ref 11.5–15.5)
WBC: 6.9 10*3/uL (ref 4.0–10.5)
nRBC: 0 % (ref 0.0–0.2)

## 2018-11-17 LAB — MAGNESIUM: Magnesium: 2 mg/dL (ref 1.7–2.4)

## 2018-11-17 NOTE — Care Management Important Message (Signed)
Important Message  Patient Details  Name: [name] MRN: [id number] Date of Birth: Jun 30, 1946   Medicare Important Message Given:  Yes    [name] 11/17/2018, 11:36 AM

## 2018-11-17 NOTE — Progress Notes (Signed)
PROGRESS NOTE        PATIENT DETAILS Name: [name] Age: 73 y.o. Sex: male Date of Birth: 11-Jul-1946 Admit Date: 10/01/2018 Admitting Physician Vianne Bulls, MD DDU:[id number], Hollice Espy, MD  Brief Narrative: Patient is a 72 y.o. male with history of A. 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  Found to have a soft tissue infection of the left hip area, acute kidney injury and hyperkalemia.  ID, orthopedics consulted-underwent left hip aspiration on 2/14, infectious disease recommending 6 weeks of IV Invanz.   See below for further details  Subjective: Numerous loose BMs yesterday-we will decrease lactulose to twice daily dosing.  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  Was on suppressive Flagyl for Bacteroides fragilis infection-recommendations from ID are for 6 weeks of IV ertapenem-end date of November 25, 2018.  AKI: Likely hemodynamically mediated-secondary to soft tissue infection of the left hip, diuretic use.  Resolved with supportive care.  Follow electrolytes periodically.  Hyperkalemia: Suspect secondary to AKI with use of Aldactone and potassium supplementation.  Resolved.  History of left total hip arthroplasty with subsequent infection with Bacteroides fragilis: Patient was evaluated by infectious disease at [facility] was previously on Flagyl.  Now on IV Invanz-Flagyl has been discontinued.  Recurrent pleural effusion: Felt to be secondary to hepatic hydrothorax-most recent CT chest on 10/07/2018 shows a small pleural effusion-patient is asymptomatic at this point.  Doubt further work-up required-furthermore he has had a recent extensive work-up including multiple thoracocentesis   Cryptogenic cirrhosis: Prior hepatitis serology done earlier this month was negative-agree with plans outlined in prior discharge summary for outpatient follow-up.  Volume status is stable-he is awake and alert-continue Aldactone  Atrial fibrillation: Rate controlled with Cardizem.Continue Eliquis.  Chronic back and right shoulder pain: Continue Lidoderm patch, Tylenol and as needed narcotics.  Bipolar disorder: Stable-continue Lexapro, Neurontin  Constipation: Has required numerous enemas-but much improved after starting lactulose, claims that he has had several loose stools yesterday-change lactulose to twice daily dosing.    Right shoulder pain: Claims he has severe arthritis-and has required cortisone shots in the past-spoke with Orion Crook, PA-C with orthopedics on 2/26, orthopedic service does not do inpatient cortisone shots.  Will manage with supportive care.  Shoulder exam is benign-with no signs of infection.  OSA: Continue CPAP nightly  Morbid obesity  Acute on chronic debility/deconditioning: Has chronic debility/deconditioning at baseline-this is worsened due to acute illness/AKI/left hip infection.  Per social work-very difficult to place to SNF.  DVT Prophylaxis: Full dose anticoagulation with Eliquis  Code Status: Full code  Family Communication: None at bedside  Disposition Plan: Remain inpatient- SNF on discharge when bed available  Antimicrobial agents: Anti-infectives (From admission, onward)   Start     Dose/Rate Route Frequency Ordered Stop   10/15/18 1500  cefTRIAXone  (ROCEPHIN) 2 g in sodium  chloride 0.9 % 100 mL IVPB  Status:  Discontinued     2 g 200 mL/hr over 30 Minutes Intravenous Every 24 hours 10/15/18 1434 10/15/18 1447   10/15/18 1500  ertapenem (INVANZ) 1,000 mg in sodium chloride 0.9 % 100 mL IVPB     1 g 200 mL/hr over 30 Minutes Intravenous Every 24 hours 10/15/18 1447 11/25/18 2359   10/08/18 0000  doxycycline (VIBRA-TABS) 100 MG tablet     100 mg Oral Every 12 hours 10/08/18 0921     10/06/18 1000  doxycycline (VIBRA-TABS) tablet 100 mg  Status:  Discontinued     100 mg Oral Every 12 hours 10/06/18 0726 10/11/18 1328   10/03/18 1800  metroNIDAZOLE (FLAGYL) tablet 500 mg  Status:  Discontinued     500 mg Oral Every 8 hours 10/03/18 1054 10/15/18 1447   10/03/18 0700  vancomycin (VANCOCIN) 2,000 mg in sodium chloride 0.9 % 500 mL IVPB  Status:  Discontinued     2,000 mg 250 mL/hr over 120 Minutes Intravenous Every 36 hours 10/01/18 1942 10/06/18 0726   10/02/18 0200  ceFEPIme (MAXIPIME) 2 g in sodium chloride 0.9 % 100 mL IVPB  Status:  Discontinued     2 g 200 mL/hr over 30 Minutes Intravenous Every 8 hours 10/01/18 1942 10/02/18 1008   10/01/18 1830  vancomycin (VANCOCIN) 2,500 mg in sodium chloride 0.9 % 500 mL IVPB     2,500 mg 250 mL/hr over 120 Minutes Intravenous STAT 10/01/18 1825 10/02/18 0126   10/01/18 1800  ceFEPIme (MAXIPIME) 2 g in sodium chloride 0.9 % 100 mL IVPB     2 g 200 mL/hr over 30 Minutes Intravenous  Once 10/01/18 1747 10/01/18 1903   10/01/18 1800  metroNIDAZOLE (FLAGYL) IVPB 500 mg  Status:  Discontinued     500 mg 100 mL/hr over 60 Minutes Intravenous Every 8 hours 10/01/18 1747 10/03/18 1054   10/01/18 1800  vancomycin (VANCOCIN) IVPB 1000 mg/200 mL premix  Status:  Discontinued     1,000 mg 200 mL/hr over 60 Minutes Intravenous  Once 10/01/18 1747 10/01/18 1825      Procedures: None  CONSULTS:  None  Time spent: 15- minutes-Greater than 50% of this time was spent in counseling, explanation of  diagnosis, planning of further management, and coordination of care.  MEDICATIONS: Scheduled Meds: . apixaban  5 mg Oral BID  . cycloSPORINE  1 drop Both Eyes BID  . diclofenac sodium  2 g Topical BID  . diltiazem  120 mg Oral Daily  . escitalopram  10 mg Oral Daily  . gabapentin  100 mg Oral TID  . lidocaine  1 patch Transdermal Q24H  . Melatonin  6 mg Oral QHS  . midodrine  10 mg Oral TID WC  . multivitamin with minerals  1 tablet Oral Daily  . pantoprazole  40 mg Oral Daily  . polyethylene glycol  17 g Oral BID  . Ensure Max Protein  11 oz Oral BID  . senna-docusate  2 tablet Oral BID  . spironolactone  50 mg Oral Daily  . thiamine  100 mg Oral Daily   Continuous Infusions: . sodium chloride 10 mL/hr at 11/15/18 1649  . ertapenem 1,000 mg (11/17/18 1423)   PRN Meds:.sodium chloride, acetaminophen, albuterol, alum & mag hydroxide-simeth, diltiazem, hydrOXYzine, menthol-cetylpyridinium, [DISCONTINUED] ondansetron **OR** ondansetron (ZOFRAN) IV, oxyCODONE, sodium chloride flush, sodium phosphate, traZODone, zolpidem   PHYSICAL EXAM: Vital signs: Vitals:   11/17/18 0429 11/17/18 0637 11/17/18 1048 11/17/18  1450  BP: 98/80  113/79 100/62  Pulse: (!) 112   (!) 104  Resp: 18   18  Temp: 98 F (36.7 C)   98.4 F (36.9 C)  TempSrc:    Oral  SpO2: 94%   96%  Weight:  118.2 kg    Height:       Filed Weights   11/15/18 0539 11/16/18 0500 11/17/18 0637  Weight: 116.9 kg 117.5 kg 118.2 kg   Body mass index is 39.62 kg/m.   General appearance:Awake, alert, not in any distress.  Eyes:no scleral icterus. HEENT: Atraumatic and Normocephalic Neck: supple, no JVD. Resp:Good air entry bilaterally,no rales or rhonchi CVS: S1 S2 regular, no murmurs.  GI: Bowel sounds present, Non tender and not distended with no gaurding, rigidity or rebound. Extremities: B/L Lower Ext shows no edema, both legs are warm to touch Neurology:  Non focal Musculoskeletal:No digital cyanosis Skin:No  Rash, warm and dry Wounds:N/A  LABORATORY DATA: CBC: Recent Labs  Lab 11/11/18 0430 11/15/18 0330 11/17/18 0405  WBC 7.3 7.7 6.9  NEUTROABS 4.2 4.6 3.7  HGB 11.1* 11.7* 11.4*  HCT 35.2* 36.1* 36.9*  MCV 89.6 89.1 89.6  PLT 255 249 629    Basic Metabolic Panel: Recent Labs  Lab 11/11/18 0430 11/15/18 0330 11/17/18 0405  NA 138 137 135  K 3.9 4.2 4.0  CL 105 105 106  CO2 25 25 23   GLUCOSE 100* 103* 100*  BUN 35* 33* 39*  CREATININE 0.99 0.99 1.07  CALCIUM 10.4* 10.7* 10.0  MG 2.0 2.2 2.0    GFR: Estimated Creatinine Clearance: 77.9 mL/min (by C-G formula based on SCr of 1.07 mg/dL).  Liver Function Tests: Recent Labs  Lab 11/11/18 0430 11/15/18 0330  AST 22 24  ALT 20 23  ALKPHOS 90 97  BILITOT 0.5 0.4  PROT 6.5 6.5  ALBUMIN 2.7* 2.8*   No results for input(s): LIPASE, AMYLASE in the last 168 hours. No results for input(s): AMMONIA in the last 168 hours.  Coagulation Profile: No results for input(s): INR, PROTIME in the last 168 hours.  Cardiac Enzymes: No results for input(s): CKTOTAL, CKMB, CKMBINDEX, TROPONINI in the last 168 hours.  BNP (last 3 results) No results for input(s): PROBNP in the last 8760 hours.  HbA1C: No results for input(s): HGBA1C in the last 72 hours.  CBG: Recent Labs  Lab 11/11/18 0854 11/13/18 0757 11/14/18 0742 11/15/18 0742  GLUCAP 96 85 95 89    Lipid Profile: No results for input(s): CHOL, HDL, LDLCALC, TRIG, CHOLHDL, LDLDIRECT in the last 72 hours.  Thyroid Function Tests: No results for input(s): TSH, T4TOTAL, FREET4, T3FREE, THYROIDAB in the last 72 hours.  Anemia Panel: No results for input(s): VITAMINB12, FOLATE, FERRITIN, TIBC, IRON, RETICCTPCT in the last 72 hours.  Urine analysis:    Component Value Date/Time   COLORURINE YELLOW 10/01/2018 1822   APPEARANCEUR CLEAR 10/01/2018 1822   APPEARANCEUR Clear 01/18/2018 1539   LABSPEC 1.010 10/01/2018 1822   PHURINE 5.0 10/01/2018 1822   GLUCOSEU  NEGATIVE 10/01/2018 1822   HGBUR NEGATIVE 10/01/2018 1822   BILIRUBINUR NEGATIVE 10/01/2018 1822   BILIRUBINUR Negative 01/18/2018 [place] 10/01/2018 1822   PROTEINUR NEGATIVE 10/01/2018 1822   UROBILINOGEN 0.2 09/06/2012 0213   NITRITE NEGATIVE 10/01/2018 1822   LEUKOCYTESUR NEGATIVE 10/01/2018 1822   LEUKOCYTESUR Negative 01/18/2018 1539    Sepsis Labs: Lactic Acid, Venous    Component Value Date/Time   LATICACIDVEN 1.7 10/01/2018 1833  MICROBIOLOGY: No results found for this or any previous visit (from the past 240 hour(s)).  RADIOLOGY STUDIES/RESULTS: Dg Abd Portable 1v  Result Date: 11/03/2018 CLINICAL DATA:  Constipation EXAM: PORTABLE ABDOMEN - 1 VIEW COMPARISON:  11/01/2018 FINDINGS: There is a non obstructive bowel gas pattern. No supine evidence of free air. No organomegaly or suspicious calcification. No acute bony abnormality. IMPRESSION: No acute findings. Electronically Signed   By: Rolm Baptise M.D.   On: 11/03/2018 11:32   Dg Abd Portable 1v  Result Date: 11/01/2018 CLINICAL DATA:  Constipation EXAM: PORTABLE ABDOMEN - 1 VIEW COMPARISON:  CT 01/09/2015 FINDINGS: Large stool burden throughout the colon. No evidence of bowel obstruction or free air. Prior cholecystectomy. Surgical sutures in the left upper abdomen. IMPRESSION: Large stool burden.  No bowel obstruction or free air. Electronically Signed   By: Rolm Baptise M.D.   On: 11/01/2018 11:03     LOS: 62 days   Oren Binet, MD  [facility]  If 7PM-7AM, please contact night-coverage  Please page via [email or web address]-Password [email or web address]-click on MD name and type text message  11/17/2018, 4:58 PM

## 2018-11-17 NOTE — Progress Notes (Signed)
Physical Therapy Treatment Patient Details Name: [name] MRN: [id number] DOB: 28-Feb-1946 Today's Date: 11/17/2018    History of Present Illness 73 y.o. male admitted 10/01/18 with generalized weakness, falls and SOB. Workup revealed afib with recurrent R-side pleural effusion; AKI. Pt with L hip cellulitis with chronic L hip prosthetic joint infection; s/p joint aspiration 2/14. PMH includes HTN, afib on Eliquis, OSA, bipolar disorder, obesity, L THA with infection and multiple prolonged hospitalizations.    PT Comments    Pt able to perform bed mobility with mod A and transfers with total A +2. Pt fearful of falling and continues to remain limited d/t fatigue and pain. Pt tolerated transfer with Denna Haggard Lift well, though he remained apprehensive and displayed self limiting behavior throughout that required max coaching and cuing to overcome and complete treatment. DC to SNF remains appropriate. Plan to progress pt bed mobility and transfer training as pt tolerates and becomes more agreeable.    Follow Up Recommendations  SNF     Equipment Recommendations  Wheelchair (measurements PT);Wheelchair cushion (measurements PT);Hospital bed;Other (comment)    Recommendations for Other Services       Precautions / Restrictions Precautions Precautions: Fall;Posterior Hip Precaution Booklet Issued: No Precaution Comments: h/o falls, very weak, per pt report posterior hip precautions. Restrictions Weight Bearing Restrictions: No    Mobility  Bed Mobility Overal bed mobility: Needs Assistance Bed Mobility: Supine to Sit     Supine to sit: Mod assist;HOB elevated     General bed mobility comments: Mod assist to support trunk to come to sitting EOB.  Pt needed extra time and use of railing for leverage to move bil LEs and hips around to EOB.   Transfers Overall transfer level: Needs assistance     Sit to Stand: Total assist;+2 safety/equipment(sara stedy plus )          General transfer comment: Pt transferred to recliner chair with sara stedy plus lift. Pt very fearful of falling. Pt tolerated transfer well and seemed less apprehensive about use of plus lift afterward.   Ambulation/Gait             General Gait Details: unable   Stairs             Wheelchair Mobility    Modified Rankin (Stroke Patients Only)       Balance Overall balance assessment: Needs assistance Sitting-balance support: Feet supported;Bilateral upper extremity supported Sitting balance-[name]: Poor Sitting balance - Comments: use of BUE on adjacent bed rails with mod A to supervision at time.    Standing balance support: Bilateral upper extremity supported Standing balance-[name]: Poor Standing balance comment: Pt required support of sara stedy plus lift to remain standing.                             Cognition Arousal/Alertness: Awake/alert Behavior During Therapy: WFL for tasks assessed/performed Overall Cognitive Status: Within Functional Limits for tasks assessed                                 General Comments: fearful of moving on the bed      Exercises General Exercises - Lower Extremity Ankle Circles/Pumps: AROM;Both;15 reps;Supine Heel Slides: AROM;Both;10 reps;Supine    General Comments        Pertinent Vitals/Pain Pain Score: 8  Pain Location: R shoulder, back, L knee all chronic  arthritic pain Pain Descriptors / Indicators: Grimacing;Guarding    Home Living                      Prior Function            PT Goals (current goals can now be found in the care plan section) Acute Rehab PT Goals Patient Stated Goal: get to chair Time For Goal Achievement: 11/29/18 Potential to Achieve Goals: Fair    Frequency    Min 3X/week      PT Plan Current plan remains appropriate    Co-evaluation              AM-PAC PT "6 Clicks" Mobility   Outcome Measure  Help needed turning  from your back to your side while in a flat bed without using bedrails?: A Lot Help needed moving from lying on your back to sitting on the side of a flat bed without using bedrails?: A Lot Help needed moving to and from a bed to a chair (including a wheelchair)?: Total Help needed standing up from a chair using your arms (e.g., wheelchair or bedside chair)?: A Lot Help needed to walk in hospital room?: Total Help needed climbing 3-5 steps with a railing? : Total 6 Click Score: 9    End of Session Equipment Utilized During Treatment: Gait belt Activity Tolerance: Patient limited by pain;Patient limited by fatigue Patient left: in chair;with call bell/phone within reach;with chair alarm set Nurse Communication: Mobility status;Need for lift equipment(Notified of pt need to transfer within 2 hours as last time he was left longer and he was sore from being left there) PT Visit Diagnosis: Other abnormalities of gait and mobility (R26.89);Muscle weakness (generalized) (M62.81);Difficulty in walking, not elsewhere classified (R26.2);History of falling (Z91.81)     Time: 3785-8850 PT Time Calculation (min) (ACUTE ONLY): 31 min  Charges:  $Therapeutic Activity: 23-37 mins                     Maryelizabeth Kaufmann, SPTA   Maryelizabeth Kaufmann 11/17/2018, 4:14 PM

## 2018-11-17 NOTE — Consult Note (Signed)
Reason for Consult: Right shoulder pain Referring Physician: Dr. Altamese Dilling is an 73 y.o. male.  HPI: [name] is a patient with right shoulder pain.  Currently admitted to the hospital for left hip infection.  This is being managed by [facility].  Patient and physician are requesting second opinion regarding the right shoulder: Specifically whether or not cortisone injection is advisable.  Patient currently is receiving IV antibiotics for chronic prosthetic left hip joint infection.  The plan on that at this time is nonoperative with antibiotic suppression.  He does have a long history of shoulder pain.  Has significant glenohumeral degenerative changes by plain radiographs done last month.  Past Medical History:  Diagnosis Date  . Anginal pain ([place]) 2006   evaluated by cardio  . Arthritis    knees,feet,shoulders,elbows.hands  . Constipation   . Dyspnea    with exertion   . Dysrhythmia    Atrial Flutter- 2006- corredted itself  . Family history of adverse reaction to anesthesia    mother- with novocaine went into shock  . GERD (gastroesophageal reflux disease)   . Headache   . Hemorrhoids   . History of blood transfusion   . Hypertension   . Insomnia   . Sleep apnea    cpap  . 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 APPENDECTOMY    . ARTERY BIOPSY Left 12/30/2017   Procedure: BIOPSY TEMPORAL ARTERY;  Surgeon: Fanny Skates, MD;  Location: [facility];  Service: General;  Laterality: Left;  . CHOLECYSTECTOMY    . COLONOSCOPY W/ POLYPECTOMY    . ESOPHAGOGASTRODUODENOSCOPY (EGD) WITH PROPOFOL  07/06/2012   Procedure: ESOPHAGOGASTRODUODENOSCOPY (EGD) WITH PROPOFOL;  Surgeon: Jeryl Columbia, MD;  Location: [facility];  Service: Endoscopy;  Laterality: N/A;  . ESOPHAGOSCOPY    . EYE SURGERY     left eye- muscle repair  . HERNIA REPAIR     ventral hernia  . INCISION AND DRAINAGE HIP Left 03/17/2018   Procedure: IRRIGATION AND DEBRIDEMENT LEFT THIGH WOUND;  Surgeon: Gaynelle Arabian, MD;  Location: [facility];  Service: Orthopedics;  Laterality: Left;  . JOINT REPLACEMENT     bilateral hips.  Right broke and had to be re placed again  . MASS EXCISION N/A 03/02/2015   Procedure: EXCISION ABDOMINAL WALL MASS;  Surgeon: Alphonsa Overall, MD;  Location: [facility];  Service: General;  Laterality: N/A;  . TOTAL HIP REVISION Left 02/17/2018   Procedure: Left total hip arthroplasty revision;  Surgeon: Gaynelle Arabian, MD;  Location: [facility];  Service: Orthopedics;  Laterality: Left;  Marland Kitchen VERTICAL BANDED GASTROPLASTY      Family History  Problem Relation Age of Onset  . Cancer Mother   . Alcohol abuse Father     Social History:  reports that he quit smoking about 34 years ago. His smoking use included cigarettes. He started smoking about 52 years ago. He has a 20.00 pack-year smoking history. He has never used smokeless tobacco. He reports current alcohol use. He reports that he does not use drugs.  Allergies:  Allergies  Allergen Reactions  . Bee Venom Swelling  . Nickel Rash    Medications: I have reviewed the patient's current medications.  Results for orders placed or performed during the hospital encounter of 10/01/18 (from the past 48 hour(s))  CBC with Differential/Platelet     Status: Abnormal   Collection Time: 11/17/18  4:05 AM  Result Value Ref  Range   WBC 6.9 4.0 - 10.5 K/uL   RBC 4.12 (L) 4.22 - 5.81 MIL/uL   Hemoglobin 11.4 (L) 13.0 - 17.0 g/dL   HCT 36.9 (L) 39.0 - 52.0 %   MCV 89.6 80.0 - 100.0 fL   MCH 27.7 26.0 - 34.0 pg   MCHC 30.9 30.0 - 36.0 g/dL   RDW 15.9 (H) 11.5 - 15.5 %   Platelets 255 150 - 400 K/uL   nRBC 0.0 0.0 - 0.2 %   Neutrophils Relative % 54 %   Neutro Abs 3.7 1.7 - 7.7 K/uL   Lymphocytes Relative 28 %   Lymphs Abs 1.9 0.7 - 4.0 K/uL   Monocytes Relative 13 %   Monocytes Absolute 0.9 0.1 - 1.0 K/uL   Eosinophils Relative 4 %   Eosinophils Absolute 0.3 0.0 - 0.5  K/uL   Basophils Relative 1 %   Basophils Absolute 0.0 0.0 - 0.1 K/uL   Immature Granulocytes 0 %   Abs Immature Granulocytes 0.02 0.00 - 0.07 K/uL    Comment: Performed at [facility], [address] [place], [place] [address]  Basic metabolic panel     Status: Abnormal   Collection Time: 11/17/18  4:05 AM  Result Value Ref Range   Sodium 135 135 - 145 mmol/L   Potassium 4.0 3.5 - 5.1 mmol/L   Chloride 106 98 - 111 mmol/L   CO2 23 22 - 32 mmol/L   Glucose, Bld 100 (H) 70 - 99 mg/dL   BUN 39 (H) 8 - 23 mg/dL   Creatinine, Ser 1.07 0.61 - 1.24 mg/dL   Calcium 10.0 8.9 - 10.3 mg/dL   GFR calc non Af Amer >60 >60 mL/min   GFR calc Af Amer >60 >60 mL/min   Anion gap 6 5 - 15    Comment: Performed at [facility], [place] [address] [place], [place] [address]  Magnesium     Status: None   Collection Time: 11/17/18  4:05 AM  Result Value Ref Range   Magnesium 2.0 1.7 - 2.4 mg/dL    Comment: Performed at [facility] [address], [place], [place] [address]    No results found.  Review of Systems  Musculoskeletal: Positive for joint pain.  All other systems reviewed and are negative.  Blood pressure 98/80, pulse (!) 112, temperature 98 F (36.7 C), resp. rate 18, height 5\' 8"  (1.727 m), weight 118.2 kg, SpO2 94 %. Physical Exam  Constitutional: He appears well-developed.  HENT:  Head: Normocephalic.  Eyes: Pupils are equal, round, and reactive to light.  Neck: Normal range of motion.  Cardiovascular: Normal rate.  Respiratory: Effort normal.  Neurological: He is alert.  Skin: Skin is warm.  Psychiatric: He has a normal mood and affect.  Examination of the right shoulder does demonstrate some pain and limitation of motion more consistent with arthritis as opposed to frozen shoulder.  Rotator cuff strength is intact.  No warmth in the right shoulder region no erythema.  Motor sensory function to the hand is intact.  External rotation on the right is about 20  degrees compared to 60 on the left.  Left shoulder range of motion is easier and less painful.  Again no warmth or erythema in that left shoulder either with good rotator cuff strength.  Assessment/Plan: Impression is bilateral shoulder arthritis with the right one being more symptomatic than left.  Infection less likely with no warmth in examination findings consistent  with severe arthritis in the shoulder joint.  Discussed with him the risk and benefits of cortisone injection in light of antibiotic suppression of the left prosthetic joint hip and infection.  In general this would be a marginally negative interventional event in terms of trying to suppress the infection in the left hip.  He is currently using a lidocaine patch for both shoulders.  He is going to wait this out a little bit longer before getting a cortisone shot in that shoulder joint.  When that is done it should be at the discretion of the orthopedic service managing his hip joint infection.  It also should be done in my opinion under fluoroscopic guidance for optimal placement and affect.  I will see him back as needed.  [name] 11/17/2018, 8:49 AM

## 2018-11-18 LAB — GLUCOSE, CAPILLARY: Glucose-Capillary: 139 mg/dL — ABNORMAL HIGH (ref 70–99)

## 2018-11-18 MED ORDER — LORAZEPAM 1 MG PO TABS
1.0000 mg | ORAL_TABLET | Freq: Once | ORAL | Status: AC
  Administered 2018-11-19: 1 mg via ORAL
  Filled 2018-11-18: qty 1

## 2018-11-18 MED ORDER — TRAZODONE HCL 50 MG PO TABS
50.0000 mg | ORAL_TABLET | Freq: Every evening | ORAL | Status: DC | PRN
  Administered 2018-11-18 – 2018-12-05 (×15): 50 mg via ORAL
  Filled 2018-11-18 (×16): qty 1

## 2018-11-18 MED ORDER — OXYCODONE HCL 5 MG PO TABS
5.0000 mg | ORAL_TABLET | ORAL | Status: DC | PRN
  Administered 2018-11-18 – 2018-11-29 (×26): 5 mg via ORAL
  Filled 2018-11-18 (×29): qty 1

## 2018-11-18 MED ORDER — LACTULOSE 10 GM/15ML PO SOLN
30.0000 g | Freq: Two times a day (BID) | ORAL | Status: DC
  Administered 2018-11-18 – 2018-11-22 (×4): 30 g via ORAL
  Filled 2018-11-18 (×7): qty 45

## 2018-11-18 NOTE — Progress Notes (Signed)
PROGRESS NOTE        PATIENT DETAILS Name: [name] Age: 73 y.o. Sex: male Date of Birth: Feb 09, 1946 Admit Date: 10/01/2018 Admitting Physician Vianne Bulls, MD [id number], Hollice Espy, MD  Brief Narrative: Patient is a 73 y.o. male with history of A. 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  Found to have a soft tissue infection of the left hip area, acute kidney injury and hyperkalemia.  ID, orthopedics consulted-underwent left hip aspiration on 2/14, infectious disease recommending 6 weeks of IV Invanz.   See below for further details  Subjective: Some mild abdominal discomfort-again did not had a bowel movement today.  Asking for change in his sleep medications.  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  Was on suppressive Flagyl for Bacteroides fragilis infection-recommendations from ID are for 6 weeks of IV ertapenem-end date of November 25, 2018.  AKI: Likely hemodynamically mediated-secondary to soft tissue infection of the left hip, diuretic use.  Resolved with supportive care.  Follow electrolytes periodically.  Hyperkalemia: Suspect secondary to AKI with use of Aldactone and potassium supplementation.  Resolved.  History of left total hip arthroplasty with subsequent infection with Bacteroides fragilis: Patient was evaluated by  [facility] was previously on Flagyl.  Now on IV Invanz-Flagyl has been discontinued.  Recurrent pleural effusion: Felt to be secondary to hepatic hydrothorax-most recent CT chest on 10/07/2018 shows a small pleural effusion-patient is asymptomatic at this point.  Doubt further work-up required-furthermore he has had a recent extensive work-up including multiple thoracocentesis   Cryptogenic cirrhosis: Prior hepatitis serology done earlier this month was negative-agree with plans outlined in prior discharge summary for outpatient follow-up.  Volume status is stable-he is awake and alert-continue Aldactone  Atrial fibrillation: Rate controlled with Cardizem.Continue Eliquis.  Chronic back and right shoulder pain: Continue Lidoderm patch, Tylenol and as needed narcotics.  Bipolar disorder: Stable-continue Lexapro, Neurontin  Constipation: Probably has constipation related to chronic use of narcotics-continue MiraLAX-add MiraLAX.  Right shoulder pain: Claims he has severe arthritis-and has required cortisone shots in the past-evaluated by orthopedics-supportive care for now.  Shoulder looks benign without any signs of infection.    OSA: Continue CPAP nightly  Morbid obesity  Acute on chronic debility/deconditioning: Has chronic debility/deconditioning at baseline-this is worsened due to acute illness/AKI/left hip infection.  Per social work-very difficult to place to SNF.  Insomnia: Stop Ambien-try trazodone.  DVT Prophylaxis: Full dose anticoagulation with Eliquis  Code Status: Full code  Family Communication: None at bedside  Disposition Plan: Remain inpatient- SNF on discharge when bed available  Antimicrobial agents: Anti-infectives (From admission, onward)   Start     Dose/Rate Route Frequency Ordered Stop   10/15/18 1500  cefTRIAXone (ROCEPHIN) 2 g in sodium chloride 0.9 % 100 mL IVPB  Status:  Discontinued     2 g 200  mL/hr over 30  Minutes Intravenous Every 24 hours 10/15/18 1434 10/15/18 1447   10/15/18 1500  ertapenem (INVANZ) 1,000 mg in sodium chloride 0.9 % 100 mL IVPB     1 g 200 mL/hr over 30 Minutes Intravenous Every 24 hours 10/15/18 1447 11/25/18 2359   10/08/18 0000  doxycycline (VIBRA-TABS) 100 MG tablet     100 mg Oral Every 12 hours 10/08/18 0921     10/06/18 1000  doxycycline (VIBRA-TABS) tablet 100 mg  Status:  Discontinued     100 mg Oral Every 12 hours 10/06/18 0726 10/11/18 1328   10/03/18 1800  metroNIDAZOLE (FLAGYL) tablet 500 mg  Status:  Discontinued     500 mg Oral Every 8 hours 10/03/18 1054 10/15/18 1447   10/03/18 0700  vancomycin (VANCOCIN) 2,000 mg in sodium chloride 0.9 % 500 mL IVPB  Status:  Discontinued     2,000 mg 250 mL/hr over 120 Minutes Intravenous Every 36 hours 10/01/18 1942 10/06/18 0726   10/02/18 0200  ceFEPIme (MAXIPIME) 2 g in sodium chloride 0.9 % 100 mL IVPB  Status:  Discontinued     2 g 200 mL/hr over 30 Minutes Intravenous Every 8 hours 10/01/18 1942 10/02/18 1008   10/01/18 1830  vancomycin (VANCOCIN) 2,500 mg in sodium chloride 0.9 % 500 mL IVPB     2,500 mg 250 mL/hr over 120 Minutes Intravenous STAT 10/01/18 1825 10/02/18 0126   10/01/18 1800  ceFEPIme (MAXIPIME) 2 g in sodium chloride 0.9 % 100 mL IVPB     2 g 200 mL/hr over 30 Minutes Intravenous  Once 10/01/18 1747 10/01/18 1903   10/01/18 1800  metroNIDAZOLE (FLAGYL) IVPB 500 mg  Status:  Discontinued     500 mg 100 mL/hr over 60 Minutes Intravenous Every 8 hours 10/01/18 1747 10/03/18 1054   10/01/18 1800  vancomycin (VANCOCIN) IVPB 1000 mg/200 mL premix  Status:  Discontinued     1,000 mg 200 mL/hr over 60 Minutes Intravenous  Once 10/01/18 1747 10/01/18 1825      Procedures: None  CONSULTS:  None  Time spent: 15- minutes-Greater than 50% of this time was spent in counseling, explanation of diagnosis, planning of further management, and coordination of care.  MEDICATIONS: Scheduled Meds: .  apixaban  5 mg Oral BID  . cycloSPORINE  1 drop Both Eyes BID  . diclofenac sodium  2 g Topical BID  . diltiazem  120 mg Oral Daily  . escitalopram  10 mg Oral Daily  . gabapentin  100 mg Oral TID  . lactulose  30 g Oral BID  . lidocaine  1 patch Transdermal Q24H  . Melatonin  6 mg Oral QHS  . midodrine  10 mg Oral TID WC  . multivitamin with minerals  1 tablet Oral Daily  . pantoprazole  40 mg Oral Daily  . polyethylene glycol  17 g Oral BID  . Ensure Max Protein  11 oz Oral BID  . senna-docusate  2 tablet Oral BID  . spironolactone  50 mg Oral Daily  . thiamine  100 mg Oral Daily   Continuous Infusions: . sodium chloride 10 mL/hr at 11/15/18 1649  . ertapenem 1,000 mg (11/18/18 1446)   PRN Meds:.sodium chloride, acetaminophen, albuterol, alum & mag hydroxide-simeth, diltiazem, hydrOXYzine, menthol-cetylpyridinium, [DISCONTINUED] ondansetron **OR** ondansetron (ZOFRAN) IV, oxyCODONE, sodium chloride flush, sodium phosphate, traZODone   PHYSICAL EXAM: Vital signs: Vitals:   11/17/18 2148 11/18/18 0525 11/18/18 0526 11/18/18 1449  BP: (!) 92/51 113/69  113/88  Pulse: 98 (!) 101  (!) 116  Resp: 16 16  16   Temp: 98.3 F (36.8 C) 97.7 F (36.5 C)  98 F (36.7 C)  TempSrc: Oral Oral  Oral  SpO2: 98% 97%  95%  Weight:   118.9 kg   Height:       Filed Weights   11/16/18 0500 11/17/18 0637 11/18/18 0526  Weight: 117.5 kg 118.2 kg 118.9 kg   Body mass index is 39.86 kg/m.   General appearance:Awake, alert, not in any distress.  Eyes:no scleral icterus. HEENT: Atraumatic and Normocephalic Neck: supple, no JVD. Resp:Good air entry bilaterally,no rales or rhonchi CVS: S1 S2 regular, no murmurs.  GI: Bowel sounds present, Non tender and not distended with no gaurding, rigidity or rebound. Extremities: B/L Lower Ext shows no edema, both legs are warm to touch Neurology:  Non focal Musculoskeletal:No digital cyanosis Skin:No Rash, warm and dry Wounds:N/A  LABORATORY  DATA: CBC: Recent Labs  Lab 11/15/18 0330 11/17/18 0405  WBC 7.7 6.9  NEUTROABS 4.6 3.7  HGB 11.7* 11.4*  HCT 36.1* 36.9*  MCV 89.1 89.6  PLT 249 814    Basic Metabolic Panel: Recent Labs  Lab 11/15/18 0330 11/17/18 0405  NA 137 135  K 4.2 4.0  CL 105 106  CO2 25 23  GLUCOSE 103* 100*  BUN 33* 39*  CREATININE 0.99 1.07  CALCIUM 10.7* 10.0  MG 2.2 2.0    GFR: Estimated Creatinine Clearance: 78.2 mL/min (by C-G formula based on SCr of 1.07 mg/dL).  Liver Function Tests: Recent Labs  Lab 11/15/18 0330  AST 24  ALT 23  ALKPHOS 97  BILITOT 0.4  PROT 6.5  ALBUMIN 2.8*   No results for input(s): LIPASE, AMYLASE in the last 168 hours. No results for input(s): AMMONIA in the last 168 hours.  Coagulation Profile: No results for input(s): INR, PROTIME in the last 168 hours.  Cardiac Enzymes: No results for input(s): CKTOTAL, CKMB, CKMBINDEX, TROPONINI in the last 168 hours.  BNP (last 3 results) No results for input(s): PROBNP in the last 8760 hours.  HbA1C: No results for input(s): HGBA1C in the last 72 hours.  CBG: Recent Labs  Lab 11/13/18 0757 11/14/18 0742 11/15/18 0742  GLUCAP 85 95 89    Lipid Profile: No results for input(s): CHOL, HDL, LDLCALC, TRIG, CHOLHDL, LDLDIRECT in the last 72 hours.  Thyroid Function Tests: No results for input(s): TSH, T4TOTAL, FREET4, T3FREE, THYROIDAB in the last 72 hours.  Anemia Panel: No results for input(s): VITAMINB12, FOLATE, FERRITIN, TIBC, IRON, RETICCTPCT in the last 72 hours.  Urine analysis:    Component Value Date/Time   COLORURINE YELLOW 10/01/2018 1822   APPEARANCEUR CLEAR 10/01/2018 1822   APPEARANCEUR Clear 01/18/2018 1539   LABSPEC 1.010 10/01/2018 1822   PHURINE 5.0 10/01/2018 1822   GLUCOSEU NEGATIVE 10/01/2018 1822   HGBUR NEGATIVE 10/01/2018 1822   BILIRUBINUR NEGATIVE 10/01/2018 1822   BILIRUBINUR Negative 01/18/2018 1539   KETONESUR NEGATIVE 10/01/2018 1822   PROTEINUR NEGATIVE  10/01/2018 1822   UROBILINOGEN 0.2 09/06/2012 0213   NITRITE NEGATIVE 10/01/2018 1822   LEUKOCYTESUR NEGATIVE 10/01/2018 1822   LEUKOCYTESUR Negative 01/18/2018 1539    Sepsis Labs: Lactic Acid, Venous    Component Value Date/Time   LATICACIDVEN 1.7 10/01/2018 1833    MICROBIOLOGY: No results found for this or any previous visit (from the past 240 hour(s)).  RADIOLOGY STUDIES/RESULTS: Dg Abd Portable 1v  Result Date: 11/03/2018 CLINICAL DATA:  Constipation EXAM: PORTABLE ABDOMEN -  1 VIEW COMPARISON:  11/01/2018 FINDINGS: There is a non obstructive bowel gas pattern. No supine evidence of free air. No organomegaly or suspicious calcification. No acute bony abnormality. IMPRESSION: No acute findings. Electronically Signed   By: Rolm Baptise M.D.   On: 11/03/2018 11:32   Dg Abd Portable 1v  Result Date: 11/01/2018 CLINICAL DATA:  Constipation EXAM: PORTABLE ABDOMEN - 1 VIEW COMPARISON:  CT 01/09/2015 FINDINGS: Large stool burden throughout the colon. No evidence of bowel obstruction or free air. Prior cholecystectomy. Surgical sutures in the left upper abdomen. IMPRESSION: Large stool burden.  No bowel obstruction or free air. Electronically Signed   By: Rolm Baptise M.D.   On: 11/01/2018 11:03     LOS: 70 days   Oren Binet, MD  [facility]  If 7PM-7AM, please contact night-coverage  Please page via [email or web address]-Password [email or web address]-click on MD name and type text message  11/18/2018, 3:22 PM

## 2018-11-18 NOTE — Progress Notes (Signed)
Nutrition Follow-up  DOCUMENTATION CODES:   Morbid obesity  INTERVENTION:    Ensure Max po BID, each supplement provides 150 kcal and 30 grams of protein  NUTRITION DIAGNOSIS:   Increased nutrient needs related to chronic illness (cirrhosis) as evidenced by estimated needs, ongoing  GOAL:   Patient will meet greater than or equal to 90% of their needs, met  MONITOR:   PO intake, Supplement acceptance, Labs, Weight trends, Skin, I & O's  ASSESSMENT:   73 y.o. male with medical history significant for chronic back and right shoulder pain, OSA on CPAP, paroxysmal atrial fibrillation on Eliquis, hepatic cirrhosis, recurrent pleural effusions, and status post left total hip arthroplasty complicated by infection   Admit dx include: Hyperkalemia [E87.5] Cellulitis of left hip [D78.242] AKI (acute kidney injury) ([place]) [N17.9]  RD spoke with patient at bedside; he is grumpy. He states his appetite is "okay;" he doesn't like the hospital food. Pt complaining that his breakfast and coffee came up cold.  Pt states "I've been on them;" referring to Nutritional Services. PO intake excellent at 100% per flowsheet records. He is also drinking his Ensure Max supplements BID.   RD encouraged him to continue to drink. Labs & medications reviewed. Weight stable.  Diet Order:   Diet Order            Diet 2 gram sodium Room service appropriate? Yes; Fluid consistency: Thin  Diet effective now        Diet - low sodium heart healthy             EDUCATION NEEDS:   Education needs have been addressed  Skin:  Skin Assessment: Skin Integrity Issues: Incisions: L hip Other: MASD bilateral on buttocks  Last BM:  3/18  Height:   Ht Readings from Last 1 Encounters:  10/01/18 5' 8"  (1.727 m)   Weight:   Wt Readings from Last 1 Encounters:  11/18/18 118.9 kg   Ideal Body Weight:  70 kg  BMI:  Body mass index is 39.86 kg/m.  Estimated Nutritional Needs:   Kcal:   2250-2450  Protein:  125-140 grams  Fluid:  2.2-2.4 L  [name], RD, LDN Pager #: [phone] After-Hours Pager #: [phone]

## 2018-11-18 NOTE — Plan of Care (Signed)

## 2018-11-19 LAB — GLUCOSE, CAPILLARY: Glucose-Capillary: 90 mg/dL (ref 70–99)

## 2018-11-19 NOTE — Plan of Care (Signed)

## 2018-11-19 NOTE — TOC Transition Note (Signed)
Transition of Care [facility]) - CM/SW Discharge Note   Patient Details  Name: [name] MRN: [id number] Date of Birth: April 14, 1946  [facility] CM/SW Contact:  Benard Halsted, LCSW Phone Number: [phone] 11/19/2018, 8:59 AM   Clinical Narrative:    Patient remains on Difficult to Place waitlist. CSW to continue to follow.           Discharge Plan and Services In-house Referral: Clinical Social Work Discharge Planning Services: CM Consult                    Social Determinants of Health (SDOH) Interventions     Readmission Risk Interventions Readmission Risk Prevention Plan 09/11/2018  Transportation Screening Complete  Medication Review Press photographer) Complete  HRI or Home Care Consult Complete  Some recent data might be hidden

## 2018-11-19 NOTE — Progress Notes (Addendum)
PROGRESS NOTE        PATIENT DETAILS Name: [name] Age: 73 y.o. Sex: male Date of Birth: [birth year] Admit Date: 10/01/2018 Admitting Physician Vianne Bulls, MD [id number], Hollice Espy, MD  Brief Narrative: Patient is a 73 y.o. male with history of A. 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  Found to have a soft tissue infection of the left hip area, acute kidney injury and hyperkalemia.  ID, orthopedics consulted-underwent left hip aspiration on 2/14, infectious disease recommending 6 weeks of IV Invanz.   See below for further details  Subjective: Slept well yesterday after we changed to trazodone, had BM.  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  Was on suppressive Flagyl for Bacteroides fragilis infection-recommendations from ID are for 6 weeks of IV ertapenem-end date of November 25, 2018.  AKI: Likely hemodynamically mediated-secondary to soft tissue infection of the left hip, diuretic use.  Resolved with supportive care.  Follow electrolytes periodically.  Hyperkalemia: Suspect secondary to AKI with use of Aldactone and potassium supplementation.  Resolved.  History of left total hip arthroplasty with subsequent infection with Bacteroides fragilis: Patient was evaluated by infectious disease at [facility] was  previously on Flagyl.  Now on IV Invanz-Flagyl has been discontinued.  Recurrent pleural effusion: Felt to be secondary to hepatic hydrothorax-most recent CT chest on 10/07/2018 shows a small pleural effusion-patient is asymptomatic at this point.  Doubt further work-up required-furthermore he has had a recent extensive work-up including multiple thoracocentesis   Cryptogenic cirrhosis: Prior hepatitis serology done earlier this month was negative-agree with plans outlined in prior discharge summary for outpatient follow-up.  Volume status is stable-he is awake and alert-continue Aldactone  Atrial fibrillation: Rate controlled with Cardizem.Continue Eliquis.  Chronic back and right shoulder pain: Continue Lidoderm patch, Tylenol and as needed narcotics.  Bipolar disorder: Stable-continue Lexapro, Neurontin  Constipation: Probably has constipation related to chronic use of narcotics-continue MiraLAX-add MiraLAX.  Right shoulder pain: Claims he has severe arthritis-and has required cortisone shots in the past-evaluated by orthopedics-supportive care for now.  Shoulder looks benign without any signs of infection.    OSA: Continue CPAP nightly  Morbid obesity  Acute on chronic debility/deconditioning: Has chronic debility/deconditioning at baseline-this is worsened due to acute illness/AKI/left hip infection.  Per social work-very difficult to place to SNF.  Insomnia: better with trazodone.  DVT Prophylaxis: Full dose anticoagulation with Eliquis  Code Status: Full code  Family Communication: None at bedside  Disposition Plan: Remain inpatient- SNF on discharge when bed available  Antimicrobial agents: Anti-infectives (From admission, onward)   Start     Dose/Rate Route Frequency Ordered Stop   10/15/18 1500  cefTRIAXone (ROCEPHIN) 2 g in sodium chloride 0.9 % 100 mL IVPB  Status:  Discontinued     2 g 200 mL/hr over 30 Minutes Intravenous Every 24 hours 10/15/18  1434 10/15/18 1447    10/15/18 1500  ertapenem (INVANZ) 1,000 mg in sodium chloride 0.9 % 100 mL IVPB     1 g 200 mL/hr over 30 Minutes Intravenous Every 24 hours 10/15/18 1447 11/25/18 2359   10/08/18 0000  doxycycline (VIBRA-TABS) 100 MG tablet     100 mg Oral Every 12 hours 10/08/18 0921     10/06/18 1000  doxycycline (VIBRA-TABS) tablet 100 mg  Status:  Discontinued     100 mg Oral Every 12 hours 10/06/18 0726 10/11/18 1328   10/03/18 1800  metroNIDAZOLE (FLAGYL) tablet 500 mg  Status:  Discontinued     500 mg Oral Every 8 hours 10/03/18 1054 10/15/18 1447   10/03/18 0700  vancomycin (VANCOCIN) 2,000 mg in sodium chloride 0.9 % 500 mL IVPB  Status:  Discontinued     2,000 mg 250 mL/hr over 120 Minutes Intravenous Every 36 hours 10/01/18 1942 10/06/18 0726   10/02/18 0200  ceFEPIme (MAXIPIME) 2 g in sodium chloride 0.9 % 100 mL IVPB  Status:  Discontinued     2 g 200 mL/hr over 30 Minutes Intravenous Every 8 hours 10/01/18 1942 10/02/18 1008   10/01/18 1830  vancomycin (VANCOCIN) 2,500 mg in sodium chloride 0.9 % 500 mL IVPB     2,500 mg 250 mL/hr over 120 Minutes Intravenous STAT 10/01/18 1825 10/02/18 0126   10/01/18 1800  ceFEPIme (MAXIPIME) 2 g in sodium chloride 0.9 % 100 mL IVPB     2 g 200 mL/hr over 30 Minutes Intravenous  Once 10/01/18 1747 10/01/18 1903   10/01/18 1800  metroNIDAZOLE (FLAGYL) IVPB 500 mg  Status:  Discontinued     500 mg 100 mL/hr over 60 Minutes Intravenous Every 8 hours 10/01/18 1747 10/03/18 1054   10/01/18 1800  vancomycin (VANCOCIN) IVPB 1000 mg/200 mL premix  Status:  Discontinued     1,000 mg 200 mL/hr over 60 Minutes Intravenous  Once 10/01/18 1747 10/01/18 1825      Procedures: None  CONSULTS:  None  Time spent: 15- minutes-Greater than 50% of this time was spent in counseling, explanation of diagnosis, planning of further management, and coordination of care.  MEDICATIONS: Scheduled Meds: . apixaban  5 mg Oral BID  . cycloSPORINE  1 drop Both Eyes BID   . diclofenac sodium  2 g Topical BID  . diltiazem  120 mg Oral Daily  . escitalopram  10 mg Oral Daily  . gabapentin  100 mg Oral TID  . lactulose  30 g Oral BID  . lidocaine  1 patch Transdermal Q24H  . Melatonin  6 mg Oral QHS  . midodrine  10 mg Oral TID WC  . multivitamin with minerals  1 tablet Oral Daily  . pantoprazole  40 mg Oral Daily  . polyethylene glycol  17 g Oral BID  . Ensure Max Protein  11 oz Oral BID  . senna-docusate  2 tablet Oral BID  . spironolactone  50 mg Oral Daily  . thiamine  100 mg Oral Daily   Continuous Infusions: . sodium chloride 1,000 mL (11/18/18 1602)  . ertapenem 1,000 mg (11/19/18 1436)   PRN Meds:.sodium chloride, acetaminophen, albuterol, alum & mag hydroxide-simeth, diltiazem, hydrOXYzine, menthol-cetylpyridinium, [DISCONTINUED] ondansetron **OR** ondansetron (ZOFRAN) IV, oxyCODONE, sodium chloride flush, sodium phosphate, traZODone   PHYSICAL EXAM: Vital signs: Vitals:   11/19/18 0351 11/19/18 0511 11/19/18 0820 11/19/18 1418  BP:  (!) 97/52 95/70 130/82  Pulse:  99 78 93  Resp:  14  15  Temp:  98 F (36.7 C)  (!) 97.5 F (36.4 C)  TempSrc:    Oral  SpO2:  97%  99%  Weight: 120.6 kg     Height:       Filed Weights   11/17/18 0637 11/18/18 0526 11/19/18 0351  Weight: 118.2 kg 118.9 kg 120.6 kg   Body mass index is 40.43 kg/m.   General appearance:Awake, alert, not in any distress.  Eyes:no scleral icterus. HEENT: Atraumatic and Normocephalic Neck: supple, no JVD. Resp:Good air entry bilaterally,no rales or rhonchi CVS: S1 S2 regular, no murmurs.  GI: Bowel sounds present, Non tender and not distended with no gaurding, rigidity or rebound. Extremities: B/L Lower Ext shows no edema, both legs are warm to touch Neurology:  Non focal Musculoskeletal:No digital cyanosis Skin:No Rash, warm and dry Wounds:N/A  LABORATORY DATA: CBC: Recent Labs  Lab 11/15/18 0330 11/17/18 0405  WBC 7.7 6.9  NEUTROABS 4.6 3.7  HGB  11.7* 11.4*  HCT 36.1* 36.9*  MCV 89.1 89.6  PLT 249 025    Basic Metabolic Panel: Recent Labs  Lab 11/15/18 0330 11/17/18 0405  NA 137 135  K 4.2 4.0  CL 105 106  CO2 25 23  GLUCOSE 103* 100*  BUN 33* 39*  CREATININE 0.99 1.07  CALCIUM 10.7* 10.0  MG 2.2 2.0    GFR: Estimated Creatinine Clearance: 78.8 mL/min (by C-G formula based on SCr of 1.07 mg/dL).  Liver Function Tests: Recent Labs  Lab 11/15/18 0330  AST 24  ALT 23  ALKPHOS 97  BILITOT 0.4  PROT 6.5  ALBUMIN 2.8*   No results for input(s): LIPASE, AMYLASE in the last 168 hours. No results for input(s): AMMONIA in the last 168 hours.  Coagulation Profile: No results for input(s): INR, PROTIME in the last 168 hours.  Cardiac Enzymes: No results for input(s): CKTOTAL, CKMB, CKMBINDEX, TROPONINI in the last 168 hours.  BNP (last 3 results) No results for input(s): PROBNP in the last 8760 hours.  HbA1C: No results for input(s): HGBA1C in the last 72 hours.  CBG: Recent Labs  Lab 11/13/18 0757 11/14/18 0742 11/15/18 0742 11/18/18 2056 11/19/18 0757  GLUCAP 85 95 89 139* 90    Lipid Profile: No results for input(s): CHOL, HDL, LDLCALC, TRIG, CHOLHDL, LDLDIRECT in the last 72 hours.  Thyroid Function Tests: No results for input(s): TSH, T4TOTAL, FREET4, T3FREE, THYROIDAB in the last 72 hours.  Anemia Panel: No results for input(s): VITAMINB12, FOLATE, FERRITIN, TIBC, IRON, RETICCTPCT in the last 72 hours.  Urine analysis:    Component Value Date/Time   COLORURINE YELLOW 10/01/2018 1822   APPEARANCEUR CLEAR 10/01/2018 1822   APPEARANCEUR Clear 01/18/2018 1539   LABSPEC 1.010 10/01/2018 1822   PHURINE 5.0 10/01/2018 1822   GLUCOSEU NEGATIVE 10/01/2018 1822   HGBUR NEGATIVE 10/01/2018 1822   BILIRUBINUR NEGATIVE 10/01/2018 1822   BILIRUBINUR Negative 01/18/2018 1539   KETONESUR NEGATIVE 10/01/2018 1822   PROTEINUR NEGATIVE 10/01/2018 1822   UROBILINOGEN 0.2 09/06/2012 0213   NITRITE  NEGATIVE 10/01/2018 1822   LEUKOCYTESUR NEGATIVE 10/01/2018 1822   LEUKOCYTESUR Negative 01/18/2018 1539    Sepsis Labs: Lactic Acid, Venous    Component Value Date/Time   LATICACIDVEN 1.7 10/01/2018 1833    MICROBIOLOGY: No results found for this or any previous visit (from the past 240 hour(s)).  RADIOLOGY STUDIES/RESULTS: Dg Abd Portable 1v  Result Date: 11/03/2018 CLINICAL DATA:  Constipation EXAM: PORTABLE ABDOMEN - 1 VIEW COMPARISON:  11/01/2018 FINDINGS: There is  a non obstructive bowel gas pattern. No supine evidence of free air. No organomegaly or suspicious calcification. No acute bony abnormality. IMPRESSION: No acute findings. Electronically Signed   By: Rolm Baptise M.D.   On: 11/03/2018 11:32   Dg Abd Portable 1v  Result Date: 11/01/2018 CLINICAL DATA:  Constipation EXAM: PORTABLE ABDOMEN - 1 VIEW COMPARISON:  CT 01/09/2015 FINDINGS: Large stool burden throughout the colon. No evidence of bowel obstruction or free air. Prior cholecystectomy. Surgical sutures in the left upper abdomen. IMPRESSION: Large stool burden.  No bowel obstruction or free air. Electronically Signed   By: Rolm Baptise M.D.   On: 11/01/2018 11:03     LOS: 16 days   Oren Binet, MD  [facility]  If 7PM-7AM, please contact night-coverage  Please page via [email or web address]-Password [email or web address]-click on MD name and type text message  11/19/2018, 2:59 PM

## 2018-11-19 NOTE — Progress Notes (Signed)
Physical Therapy Treatment Patient Details Name: [name] MRN: [id number] DOB: [birth year] Today's Date: 11/19/2018    History of Present Illness 73 y.o. male admitted 10/01/18 with generalized weakness, falls and SOB. Workup revealed afib with recurrent R-side pleural effusion; AKI. Pt with L hip cellulitis with chronic L hip prosthetic joint infection; s/p joint aspiration 2/14. PMH includes HTN, afib on Eliquis, OSA, bipolar disorder, obesity, L THA with infection and multiple prolonged hospitalizations.    PT Comments    Pt performed supine exercises followed by transfer to standing with sara plus sit to stand lift.  Pt improved with posturing in lift and able to extend hips more.  Pt continues to tolerate session with slow progress and will benefit from skilled rehab in a post acute setting.     Follow Up Recommendations  SNF     Equipment Recommendations  Wheelchair (measurements PT);Wheelchair cushion (measurements PT);Hospital bed;Other (comment)    Recommendations for Other Services       Precautions / Restrictions Precautions Precautions: Fall;Posterior Hip Precaution Booklet Issued: No Precaution Comments: h/o falls, very weak, per pt report posterior hip precautions. Restrictions Weight Bearing Restrictions: No    Mobility  Bed Mobility Overal bed mobility: Needs Assistance Bed Mobility: Supine to Sit;Sit to Supine     Supine to sit: Mod assist;HOB elevated     General bed mobility comments: Pt performed helicopter transfer to achieve sitting edge of bed.  Assistance to advance LEs and elevate trunk into sitting.    Transfers Overall transfer level: Needs assistance Equipment used: Rolling walker (2 wheeled) Transfers: Sit to/from Stand Sit to Stand: Total assist;+2 safety/equipment(Pt performed with better trunk control and improved ability to maintain hip extension.  )         General transfer comment: Pt transferred to recliner chair with sara stedy  plus lift. Pt very fearful of falling.   Ambulation/Gait Ambulation/Gait assistance: (NT)               Stairs             Wheelchair Mobility    Modified Rankin (Stroke Patients Only)       Balance Overall balance assessment: Needs assistance Sitting-balance support: Feet supported;Bilateral upper extremity supported Sitting balance-[name]: Poor Sitting balance - Comments: use of BUE on adjacent bed rails with mod A to supervision at time.    Standing balance support: Bilateral upper extremity supported Standing balance-[name]: Poor Standing balance comment: Pt required support of sara stedy plus lift to remain standing.                             Cognition Arousal/Alertness: Awake/alert Behavior During Therapy: WFL for tasks assessed/performed Overall Cognitive Status: Within Functional Limits for tasks assessed                                 General Comments: fearful of moving on the bed      Exercises General Exercises - Lower Extremity Ankle Circles/Pumps: AROM;Both;20 reps;Supine Quad Sets: AROM;Both;10 reps;Supine Heel Slides: AROM;Both;10 reps;Supine Hip ABduction/ADduction: AROM;Both;10 reps;AAROM;Supine    General Comments        Pertinent Vitals/Pain Pain Assessment: No/denies pain Pain Score: 8  Pain Location: R shoulder, back, L knee all chronic arthritic pain Pain Descriptors / Indicators: Grimacing;Guarding Pain Intervention(s): Monitored during session;Repositioned    Home Living  Prior Function            PT Goals (current goals can now be found in the care plan section) Acute Rehab PT Goals Patient Stated Goal: get to chair Potential to Achieve Goals: Fair Progress towards PT goals: Progressing toward goals    Frequency    Min 3X/week      PT Plan Current plan remains appropriate    Co-evaluation              AM-PAC PT "6 Clicks" Mobility    Outcome Measure  Help needed turning from your back to your side while in a flat bed without using bedrails?: A Lot Help needed moving from lying on your back to sitting on the side of a flat bed without using bedrails?: A Lot Help needed moving to and from a bed to a chair (including a wheelchair)?: Total Help needed standing up from a chair using your arms (e.g., wheelchair or bedside chair)?: A Lot Help needed to walk in hospital room?: Total Help needed climbing 3-5 steps with a railing? : Total 6 Click Score: 9    End of Session Equipment Utilized During Treatment: Gait belt Activity Tolerance: Patient limited by pain;Patient limited by fatigue Patient left: in chair;with call bell/phone within reach;with chair alarm set Nurse Communication: Mobility status;Need for lift equipment(notified nursing of back to bed) PT Visit Diagnosis: Other abnormalities of gait and mobility (R26.89);Muscle weakness (generalized) (M62.81);Difficulty in walking, not elsewhere classified (R26.2);History of falling (Z91.81)     Time: 0459-9774 PT Time Calculation (min) (ACUTE ONLY): 24 min  Charges:  $Therapeutic Exercise: 8-22 mins $Therapeutic Activity: 8-22 mins                     Governor Rooks, PTA Acute Rehabilitation Services Pager [phone] Office [phone]     Elizer Bostic Eli Hose 11/19/2018, 4:02 PM

## 2018-11-20 LAB — GLUCOSE, CAPILLARY: Glucose-Capillary: 113 mg/dL — ABNORMAL HIGH (ref 70–99)

## 2018-11-20 NOTE — Progress Notes (Signed)
PROGRESS NOTE        PATIENT DETAILS Name: [name] Age: 73 y.o. Sex: male Date of Birth: 07-06-1946 Admit Date: 10/01/2018 Admitting Physician Vianne Bulls, MD [id number], Hollice Espy, MD  Brief Narrative: Patient is a 73 y.o. male with history of A. 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  Found to have a soft tissue infection of the left hip area, acute kidney injury and hyperkalemia.  ID, orthopedics consulted-underwent left hip aspiration on 2/14, infectious disease recommending 6 weeks of IV Invanz.   See below for further details  Subjective: No BM today-last BM was yesterday.  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  Was on suppressive Flagyl for Bacteroides fragilis infection-recommendations from ID are for 6 weeks of IV ertapenem-end date of November 25, 2018.  AKI: Likely hemodynamically mediated-secondary to soft tissue infection of the left hip, diuretic use.  Resolved with supportive care.  Follow electrolytes periodically.  Hyperkalemia: Suspect secondary to AKI with use of Aldactone and potassium supplementation.  Resolved.  History of left total hip arthroplasty with subsequent infection with Bacteroides fragilis: Patient was evaluated by infectious disease at [facility] was previously on Flagyl.  Now on IV  Invanz-Flagyl has been discontinued.  Recurrent pleural effusion: Felt to be secondary to hepatic hydrothorax-most recent CT chest on 10/07/2018 shows a small pleural effusion-patient is asymptomatic at this point.  Doubt further work-up required-furthermore he has had a recent extensive work-up including multiple thoracocentesis   Cryptogenic cirrhosis: Prior hepatitis serology done earlier this month was negative-agree with plans outlined in prior discharge summary for outpatient follow-up.  Volume status is stable-he is awake and alert-continue Aldactone  Atrial fibrillation: Rate controlled with Cardizem.Continue Eliquis.  Chronic back and right shoulder pain: Continue Lidoderm patch, Tylenol and as needed narcotics.  Bipolar disorder: Stable-continue Lexapro, Neurontin  Constipation: Probably has constipation related to chronic use of narcotics-continue MiraLAX-add MiraLAX.  Right shoulder pain: Claims he has severe arthritis-and has required cortisone shots in the past-evaluated by orthopedics-supportive care for now.  Shoulder looks benign without any signs of infection.    OSA: Continue CPAP nightly  Morbid obesity  Acute on chronic debility/deconditioning: Has chronic debility/deconditioning at baseline-this is worsened due to acute illness/AKI/left hip infection.  Per social work-very difficult to place to SNF.  Insomnia: better with trazodone.  DVT Prophylaxis: Full dose anticoagulation with Eliquis  Code Status: Full code  Family Communication: Ex-wife at bedside  Disposition Plan: Remain inpatient- SNF on discharge when bed available  Antimicrobial agents: Anti-infectives (From admission, onward)   Start     Dose/Rate Route Frequency Ordered Stop   10/15/18 1500  cefTRIAXone (ROCEPHIN) 2 g in sodium chloride 0.9 % 100 mL IVPB  Status:  Discontinued     2 g 200 mL/hr over 30 Minutes Intravenous Every 24 hours 10/15/18 1434 10/15/18 1447  10/15/18 1500  ertapenem  (INVANZ) 1,000 mg in sodium chloride 0.9 % 100 mL IVPB     1 g 200 mL/hr over 30 Minutes Intravenous Every 24 hours 10/15/18 1447 11/25/18 2359   10/08/18 0000  doxycycline (VIBRA-TABS) 100 MG tablet     100 mg Oral Every 12 hours 10/08/18 0921     10/06/18 1000  doxycycline (VIBRA-TABS) tablet 100 mg  Status:  Discontinued     100 mg Oral Every 12 hours 10/06/18 0726 10/11/18 1328   10/03/18 1800  metroNIDAZOLE (FLAGYL) tablet 500 mg  Status:  Discontinued     500 mg Oral Every 8 hours 10/03/18 1054 10/15/18 1447   10/03/18 0700  vancomycin (VANCOCIN) 2,000 mg in sodium chloride 0.9 % 500 mL IVPB  Status:  Discontinued     2,000 mg 250 mL/hr over 120 Minutes Intravenous Every 36 hours 10/01/18 1942 10/06/18 0726   10/02/18 0200  ceFEPIme (MAXIPIME) 2 g in sodium chloride 0.9 % 100 mL IVPB  Status:  Discontinued     2 g 200 mL/hr over 30 Minutes Intravenous Every 8 hours 10/01/18 1942 10/02/18 1008   10/01/18 1830  vancomycin (VANCOCIN) 2,500 mg in sodium chloride 0.9 % 500 mL IVPB     2,500 mg 250 mL/hr over 120 Minutes Intravenous STAT 10/01/18 1825 10/02/18 0126   10/01/18 1800  ceFEPIme (MAXIPIME) 2 g in sodium chloride 0.9 % 100 mL IVPB     2 g 200 mL/hr over 30 Minutes Intravenous  Once 10/01/18 1747 10/01/18 1903   10/01/18 1800  metroNIDAZOLE (FLAGYL) IVPB 500 mg  Status:  Discontinued     500 mg 100 mL/hr over 60 Minutes Intravenous Every 8 hours 10/01/18 1747 10/03/18 1054   10/01/18 1800  vancomycin (VANCOCIN) IVPB 1000 mg/200 mL premix  Status:  Discontinued     1,000 mg 200 mL/hr over 60 Minutes Intravenous  Once 10/01/18 1747 10/01/18 1825      Procedures: None  CONSULTS:  None  Time spent: 15- minutes-Greater than 50% of this time was spent in counseling, explanation of diagnosis, planning of further management, and coordination of care.  MEDICATIONS: Scheduled Meds: . apixaban  5 mg Oral BID  . cycloSPORINE  1 drop Both Eyes BID  . diclofenac sodium  2 g  Topical BID  . diltiazem  120 mg Oral Daily  . escitalopram  10 mg Oral Daily  . gabapentin  100 mg Oral TID  . lactulose  30 g Oral BID  . lidocaine  1 patch Transdermal Q24H  . Melatonin  6 mg Oral QHS  . midodrine  10 mg Oral TID WC  . multivitamin with minerals  1 tablet Oral Daily  . pantoprazole  40 mg Oral Daily  . polyethylene glycol  17 g Oral BID  . Ensure Max Protein  11 oz Oral BID  . senna-docusate  2 tablet Oral BID  . spironolactone  50 mg Oral Daily  . thiamine  100 mg Oral Daily   Continuous Infusions: . sodium chloride 1,000 mL (11/18/18 1602)  . ertapenem 1,000 mg (11/19/18 1436)   PRN Meds:.sodium chloride, acetaminophen, albuterol, alum & mag hydroxide-simeth, diltiazem, hydrOXYzine, menthol-cetylpyridinium, [DISCONTINUED] ondansetron **OR** ondansetron (ZOFRAN) IV, oxyCODONE, sodium chloride flush, sodium phosphate, traZODone   PHYSICAL EXAM: Vital signs: Vitals:   11/19/18 2145 11/20/18 0606 11/20/18 0847 11/20/18 1412  BP: (!) 92/59 109/71 105/69 (!) 95/59  Pulse: 99 97  95  Resp: 19 20    Temp: 98.1  F (36.7 C) (!) 97.5 F (36.4 C)  98 F (36.7 C)  TempSrc: Oral Oral  Oral  SpO2: 98% 98%  96%  Weight:  119.2 kg    Height:       Filed Weights   11/18/18 0526 11/19/18 0351 11/20/18 0606  Weight: 118.9 kg 120.6 kg 119.2 kg   Body mass index is 39.96 kg/m.   General appearance:Awake, alert, not in any distress.  Eyes:no scleral icterus. HEENT: Atraumatic and Normocephalic Neck: supple, no JVD. Resp:Good air entry bilaterally,no rales or rhonchi CVS: S1 S2 regular, no murmurs.  GI: Bowel sounds present, Non tender and not distended with no gaurding, rigidity or rebound. Extremities: B/L Lower Ext shows no edema, both legs are warm to touch Neurology:  Non focal Musculoskeletal:No digital cyanosis Skin:No Rash, warm and dry Wounds:N/A  LABORATORY DATA: CBC: Recent Labs  Lab 11/15/18 0330 11/17/18 0405  WBC 7.7 6.9  NEUTROABS 4.6 3.7   HGB 11.7* 11.4*  HCT 36.1* 36.9*  MCV 89.1 89.6  PLT 249 144    Basic Metabolic Panel: Recent Labs  Lab 11/15/18 0330 11/17/18 0405  NA 137 135  K 4.2 4.0  CL 105 106  CO2 25 23  GLUCOSE 103* 100*  BUN 33* 39*  CREATININE 0.99 1.07  CALCIUM 10.7* 10.0  MG 2.2 2.0    GFR: Estimated Creatinine Clearance: 78.3 mL/min (by C-G formula based on SCr of 1.07 mg/dL).  Liver Function Tests: Recent Labs  Lab 11/15/18 0330  AST 24  ALT 23  ALKPHOS 97  BILITOT 0.4  PROT 6.5  ALBUMIN 2.8*   No results for input(s): LIPASE, AMYLASE in the last 168 hours. No results for input(s): AMMONIA in the last 168 hours.  Coagulation Profile: No results for input(s): INR, PROTIME in the last 168 hours.  Cardiac Enzymes: No results for input(s): CKTOTAL, CKMB, CKMBINDEX, TROPONINI in the last 168 hours.  BNP (last 3 results) No results for input(s): PROBNP in the last 8760 hours.  HbA1C: No results for input(s): HGBA1C in the last 72 hours.  CBG: Recent Labs  Lab 11/14/18 0742 11/15/18 0742 11/18/18 2056 11/19/18 0757  GLUCAP 95 89 139* 90    Lipid Profile: No results for input(s): CHOL, HDL, LDLCALC, TRIG, CHOLHDL, LDLDIRECT in the last 72 hours.  Thyroid Function Tests: No results for input(s): TSH, T4TOTAL, FREET4, T3FREE, THYROIDAB in the last 72 hours.  Anemia Panel: No results for input(s): VITAMINB12, FOLATE, FERRITIN, TIBC, IRON, RETICCTPCT in the last 72 hours.  Urine analysis:    Component Value Date/Time   COLORURINE YELLOW 10/01/2018 1822   APPEARANCEUR CLEAR 10/01/2018 1822   APPEARANCEUR Clear 01/18/2018 1539   LABSPEC 1.010 10/01/2018 1822   PHURINE 5.0 10/01/2018 1822   GLUCOSEU NEGATIVE 10/01/2018 1822   HGBUR NEGATIVE 10/01/2018 1822   BILIRUBINUR NEGATIVE 10/01/2018 1822   BILIRUBINUR Negative 01/18/2018 1539   KETONESUR NEGATIVE 10/01/2018 1822   PROTEINUR NEGATIVE 10/01/2018 1822   UROBILINOGEN 0.2 09/06/2012 0213   NITRITE NEGATIVE  10/01/2018 1822   LEUKOCYTESUR NEGATIVE 10/01/2018 1822   LEUKOCYTESUR Negative 01/18/2018 1539    Sepsis Labs: Lactic Acid, Venous    Component Value Date/Time   LATICACIDVEN 1.7 10/01/2018 1833    MICROBIOLOGY: No results found for this or any previous visit (from the past 240 hour(s)).  RADIOLOGY STUDIES/RESULTS: Dg Abd Portable 1v  Result Date: 11/03/2018 CLINICAL DATA:  Constipation EXAM: PORTABLE ABDOMEN - 1 VIEW COMPARISON:  11/01/2018 FINDINGS: There is a non obstructive bowel  gas pattern. No supine evidence of free air. No organomegaly or suspicious calcification. No acute bony abnormality. IMPRESSION: No acute findings. Electronically Signed   By: Rolm Baptise M.D.   On: 11/03/2018 11:32   Dg Abd Portable 1v  Result Date: 11/01/2018 CLINICAL DATA:  Constipation EXAM: PORTABLE ABDOMEN - 1 VIEW COMPARISON:  CT 01/09/2015 FINDINGS: Large stool burden throughout the colon. No evidence of bowel obstruction or free air. Prior cholecystectomy. Surgical sutures in the left upper abdomen. IMPRESSION: Large stool burden.  No bowel obstruction or free air. Electronically Signed   By: Rolm Baptise M.D.   On: 11/01/2018 11:03     LOS: 6 days   Oren Binet, MD  [facility]  If 7PM-7AM, please contact night-coverage  Please page via [email or web address]-Password [email or web address]-click on MD name and type text message  11/20/2018, 2:48 PM

## 2018-11-20 NOTE — Plan of Care (Signed)
  Problem: Education: Goal: Knowledge of General Education information will improve Description Including pain rating scale, medication(s)/side effects and non-pharmacologic comfort measures Outcome: Adequate for Discharge   Problem: Health Behavior/Discharge Planning: Goal: Ability to manage health-related needs will improve Outcome: Adequate for Discharge   

## 2018-11-20 NOTE — Progress Notes (Signed)
Patient has been bathed, moisturizer applied to body, barrier cream applied under abdominal folds where he has moisture associated skin damage. A pillow is placed underneath patient right side to relieve pressure. He also had a full linen change. He is in bed with no complain.

## 2018-11-21 MED ORDER — CHLORHEXIDINE GLUCONATE CLOTH 2 % EX PADS
6.0000 | MEDICATED_PAD | Freq: Every day | CUTANEOUS | Status: DC
  Administered 2018-11-21 – 2018-12-06 (×15): 6 via TOPICAL

## 2018-11-21 NOTE — Progress Notes (Signed)
PROGRESS NOTE        PATIENT DETAILS Name: [name] Age: 73 y.o. Sex: male Date of Birth: 06-13-1946 Admit Date: 10/01/2018 Admitting Physician Vianne Bulls, MD [id number], Hollice Espy, MD  Brief Narrative: Patient is a 73 y.o. male with history of A. 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  Found to have a soft tissue infection of the left hip area, acute kidney injury and hyperkalemia.  ID, orthopedics consulted-underwent left hip aspiration on 2/14, infectious disease recommending 6 weeks of IV Invanz.   See below for further details  Subjective: BM yesterday-slept overnight.  Complains of some pain in the right shoulder.  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  Was on suppressive Flagyl for Bacteroides fragilis infection-recommendations from ID are for 6 weeks of IV ertapenem-end date of November 25, 2018.  AKI: Likely hemodynamically mediated-secondary to soft tissue infection of the left hip, diuretic use.  Resolved with supportive care.  Follow electrolytes periodically.  Hyperkalemia: Suspect secondary to AKI with use of Aldactone and potassium supplementation.  Resolved.  History of left total hip arthroplasty with subsequent infection with Bacteroides fragilis: Patient was evaluated by infectious disease at [facility] was previously on Flagyl.  Now on IV Invanz-Flagyl has been discontinued.  Recurrent pleural effusion: Felt to be secondary to hepatic hydrothorax-most recent CT chest on 10/07/2018 shows a small pleural effusion-patient is asymptomatic at this point.  Doubt further work-up required-furthermore he has had a recent extensive work-up including multiple thoracocentesis   Cryptogenic cirrhosis: Prior hepatitis serology done earlier this month was negative-agree with plans outlined in prior discharge summary for outpatient follow-up.  Volume status is stable-he is awake and alert-continue Aldactone  Atrial fibrillation: Rate controlled with Cardizem.Continue Eliquis.  Chronic back and right shoulder pain: Continue Lidoderm patch, Tylenol and as needed narcotics.  Bipolar disorder: Stable-continue Lexapro, Neurontin  Constipation: Probably has constipation related to chronic use of narcotics-continue MiraLAX-add MiraLAX.  Right shoulder pain: Claims he has severe arthritis-and has required cortisone shots in the past-evaluated by orthopedics-supportive care for now.  Shoulder looks benign without any signs of infection.    OSA: Continue CPAP nightly  Morbid obesity  Acute on chronic debility/deconditioning: Has chronic debility/deconditioning at baseline-this is worsened due to acute illness/AKI/left hip infection.  Per social work-very difficult to place to SNF.  Insomnia: better with trazodone.  DVT Prophylaxis: Full dose anticoagulation with Eliquis  Code Status: Full code  Family Communication: Ex-wife at bedside  Disposition Plan: Remain inpatient- SNF on discharge when bed available  Antimicrobial agents: Anti-infectives (From admission, onward)   Start     Dose/Rate Route Frequency Ordered Stop   10/15/18 1500  cefTRIAXone (ROCEPHIN) 2 g in sodium chloride 0.9 % 100 mL IVPB  Status:  Discontinued     2 g 200 mL/hr over 30 Minutes Intravenous Every 24  hours  10/15/18 1434 10/15/18 1447   10/15/18 1500  ertapenem (INVANZ) 1,000 mg in sodium chloride 0.9 % 100 mL IVPB     1 g 200 mL/hr over 30 Minutes Intravenous Every 24 hours 10/15/18 1447 11/25/18 2359   10/08/18 0000  doxycycline (VIBRA-TABS) 100 MG tablet     100 mg Oral Every 12 hours 10/08/18 0921     10/06/18 1000  doxycycline (VIBRA-TABS) tablet 100 mg  Status:  Discontinued     100 mg Oral Every 12 hours 10/06/18 0726 10/11/18 1328   10/03/18 1800  metroNIDAZOLE (FLAGYL) tablet 500 mg  Status:  Discontinued     500 mg Oral Every 8 hours 10/03/18 1054 10/15/18 1447   10/03/18 0700  vancomycin (VANCOCIN) 2,000 mg in sodium chloride 0.9 % 500 mL IVPB  Status:  Discontinued     2,000 mg 250 mL/hr over 120 Minutes Intravenous Every 36 hours 10/01/18 1942 10/06/18 0726   10/02/18 0200  ceFEPIme (MAXIPIME) 2 g in sodium chloride 0.9 % 100 mL IVPB  Status:  Discontinued     2 g 200 mL/hr over 30 Minutes Intravenous Every 8 hours 10/01/18 1942 10/02/18 1008   10/01/18 1830  vancomycin (VANCOCIN) 2,500 mg in sodium chloride 0.9 % 500 mL IVPB     2,500 mg 250 mL/hr over 120 Minutes Intravenous STAT 10/01/18 1825 10/02/18 0126   10/01/18 1800  ceFEPIme (MAXIPIME) 2 g in sodium chloride 0.9 % 100 mL IVPB     2 g 200 mL/hr over 30 Minutes Intravenous  Once 10/01/18 1747 10/01/18 1903   10/01/18 1800  metroNIDAZOLE (FLAGYL) IVPB 500 mg  Status:  Discontinued     500 mg 100 mL/hr over 60 Minutes Intravenous Every 8 hours 10/01/18 1747 10/03/18 1054   10/01/18 1800  vancomycin (VANCOCIN) IVPB 1000 mg/200 mL premix  Status:  Discontinued     1,000 mg 200 mL/hr over 60 Minutes Intravenous  Once 10/01/18 1747 10/01/18 1825      Procedures: None  CONSULTS:  None  Time spent: 15- minutes-Greater than 50% of this time was spent in counseling, explanation of diagnosis, planning of further management, and coordination of care.  MEDICATIONS: Scheduled Meds: . apixaban  5 mg Oral BID  .  Chlorhexidine Gluconate Cloth  6 each Topical Daily  . cycloSPORINE  1 drop Both Eyes BID  . diclofenac sodium  2 g Topical BID  . diltiazem  120 mg Oral Daily  . escitalopram  10 mg Oral Daily  . gabapentin  100 mg Oral TID  . lactulose  30 g Oral BID  . lidocaine  1 patch Transdermal Q24H  . Melatonin  6 mg Oral QHS  . midodrine  10 mg Oral TID WC  . multivitamin with minerals  1 tablet Oral Daily  . pantoprazole  40 mg Oral Daily  . polyethylene glycol  17 g Oral BID  . Ensure Max Protein  11 oz Oral BID  . senna-docusate  2 tablet Oral BID  . spironolactone  50 mg Oral Daily  . thiamine  100 mg Oral Daily   Continuous Infusions: . sodium chloride 10 mL/hr at 11/21/18 0302  . ertapenem 1,000 mg (11/20/18 1450)   PRN Meds:.sodium chloride, acetaminophen, albuterol, alum & mag hydroxide-simeth, diltiazem, hydrOXYzine, menthol-cetylpyridinium, [DISCONTINUED] ondansetron **OR** ondansetron (ZOFRAN) IV, oxyCODONE, sodium chloride flush, sodium phosphate, traZODone   PHYSICAL EXAM: Vital signs: Vitals:   11/20/18 1412 11/20/18 2038 11/21/18 0410 11/21/18 0844  BP: (!) 95/59 106/67  109/75 (!) 87/56  Pulse: 95 93 89 (!) 107  Resp: 18 20 18    Temp: 98 F (36.7 C) 98.9 F (37.2 C) 97.8 F (36.6 C) 97.6 F (36.4 C)  TempSrc: Oral Oral Oral Oral  SpO2: 96% 98% 96% 97%  Weight:   118.6 kg   Height:       Filed Weights   11/19/18 0351 11/20/18 0606 11/21/18 0410  Weight: 120.6 kg 119.2 kg 118.6 kg   Body mass index is 39.76 kg/m.   General appearance:Awake, alert, not in any distress.  Eyes:no scleral icterus. HEENT: Atraumatic and Normocephalic Neck: supple, no JVD. Resp:Good air entry bilaterally,no rales or rhonchi CVS: S1 S2 regular, no murmurs.  GI: Bowel sounds present, Non tender and not distended with no gaurding, rigidity or rebound. Extremities: B/L Lower Ext shows no edema, both legs are warm to touch Neurology:  Non focal Musculoskeletal:No digital  cyanosis Skin:No Rash, warm and dry Wounds:N/A  LABORATORY DATA: CBC: Recent Labs  Lab 11/15/18 0330 11/17/18 0405  WBC 7.7 6.9  NEUTROABS 4.6 3.7  HGB 11.7* 11.4*  HCT 36.1* 36.9*  MCV 89.1 89.6  PLT 249 096    Basic Metabolic Panel: Recent Labs  Lab 11/15/18 0330 11/17/18 0405  NA 137 135  K 4.2 4.0  CL 105 106  CO2 25 23  GLUCOSE 103* 100*  BUN 33* 39*  CREATININE 0.99 1.07  CALCIUM 10.7* 10.0  MG 2.2 2.0    GFR: Estimated Creatinine Clearance: 78.1 mL/min (by C-G formula based on SCr of 1.07 mg/dL).  Liver Function Tests: Recent Labs  Lab 11/15/18 0330  AST 24  ALT 23  ALKPHOS 97  BILITOT 0.4  PROT 6.5  ALBUMIN 2.8*   No results for input(s): LIPASE, AMYLASE in the last 168 hours. No results for input(s): AMMONIA in the last 168 hours.  Coagulation Profile: No results for input(s): INR, PROTIME in the last 168 hours.  Cardiac Enzymes: No results for input(s): CKTOTAL, CKMB, CKMBINDEX, TROPONINI in the last 168 hours.  BNP (last 3 results) No results for input(s): PROBNP in the last 8760 hours.  HbA1C: No results for input(s): HGBA1C in the last 72 hours.  CBG: Recent Labs  Lab 11/15/18 0742 11/18/18 2056 11/19/18 0757 11/20/18 2036  GLUCAP 89 139* 90 113*    Lipid Profile: No results for input(s): CHOL, HDL, LDLCALC, TRIG, CHOLHDL, LDLDIRECT in the last 72 hours.  Thyroid Function Tests: No results for input(s): TSH, T4TOTAL, FREET4, T3FREE, THYROIDAB in the last 72 hours.  Anemia Panel: No results for input(s): VITAMINB12, FOLATE, FERRITIN, TIBC, IRON, RETICCTPCT in the last 72 hours.  Urine analysis:    Component Value Date/Time   COLORURINE YELLOW 10/01/2018 1822   APPEARANCEUR CLEAR 10/01/2018 1822   APPEARANCEUR Clear 01/18/2018 1539   LABSPEC 1.010 10/01/2018 1822   PHURINE 5.0 10/01/2018 1822   GLUCOSEU NEGATIVE 10/01/2018 1822   HGBUR NEGATIVE 10/01/2018 1822   BILIRUBINUR NEGATIVE 10/01/2018 1822   BILIRUBINUR  Negative 01/18/2018 1539   KETONESUR NEGATIVE 10/01/2018 1822   PROTEINUR NEGATIVE 10/01/2018 1822   UROBILINOGEN 0.2 09/06/2012 0213   NITRITE NEGATIVE 10/01/2018 1822   LEUKOCYTESUR NEGATIVE 10/01/2018 1822   LEUKOCYTESUR Negative 01/18/2018 1539    Sepsis Labs: Lactic Acid, Venous    Component Value Date/Time   LATICACIDVEN 1.7 10/01/2018 1833    MICROBIOLOGY: No results found for this or any previous visit (from the past 240 hour(s)).  RADIOLOGY STUDIES/RESULTS: Dg Abd Portable 1v  Result Date:  11/03/2018 CLINICAL DATA:  Constipation EXAM: PORTABLE ABDOMEN - 1 VIEW COMPARISON:  11/01/2018 FINDINGS: There is a non obstructive bowel gas pattern. No supine evidence of free air. No organomegaly or suspicious calcification. No acute bony abnormality. IMPRESSION: No acute findings. Electronically Signed   By: Rolm Baptise M.D.   On: 11/03/2018 11:32   Dg Abd Portable 1v  Result Date: 11/01/2018 CLINICAL DATA:  Constipation EXAM: PORTABLE ABDOMEN - 1 VIEW COMPARISON:  CT 01/09/2015 FINDINGS: Large stool burden throughout the colon. No evidence of bowel obstruction or free air. Prior cholecystectomy. Surgical sutures in the left upper abdomen. IMPRESSION: Large stool burden.  No bowel obstruction or free air. Electronically Signed   By: Rolm Baptise M.D.   On: 11/01/2018 11:03     LOS: 50 days   Oren Binet, MD  [facility]  If 7PM-7AM, please contact night-coverage  Please page via [email or web address]-click on MD name and type text message  11/21/2018, 12:03 PM

## 2018-11-21 NOTE — Plan of Care (Signed)
  Problem: Education: Goal: Knowledge of General Education information will improve Description Including pain rating scale, medication(s)/side effects and non-pharmacologic comfort measures Outcome: Progressing   Problem: Health Behavior/Discharge Planning: Goal: Ability to manage health-related needs will improve Outcome: Progressing   Problem: Clinical Measurements: Goal: Ability to maintain clinical measurements within normal limits will improve Outcome: Progressing Goal: Will remain free from infection Outcome: Progressing Goal: Diagnostic test results will improve Outcome: Progressing Goal: Respiratory complications will improve Outcome: Progressing Goal: Cardiovascular complication will be avoided Outcome: Progressing   Problem: Activity: Goal: Risk for activity intolerance will decrease Outcome: Progressing   Problem: Coping: Goal: Level of anxiety will decrease Outcome: Progressing   Problem: Elimination: Goal: Will not experience complications related to bowel motility Outcome: Progressing Goal: Will not experience complications related to urinary retention Outcome: Progressing   Problem: Pain Managment: Goal: General experience of comfort will improve Outcome: Progressing   Problem: Safety: Goal: Ability to remain free from injury will improve Outcome: Progressing   Problem: Skin Integrity: Goal: Risk for impaired skin integrity will decrease Outcome: Progressing   Problem: Fluid Volume: Goal: Fluid volume balance will be maintained or improved Outcome: Progressing   Problem: Self-Concept: Goal: Body image disturbance will be avoided or minimized Outcome: Progressing   Problem: Urinary Elimination: Goal: Progression of disease will be identified and treated Outcome: Progressing   Problem: Education: Goal: Knowledge of General Education information will improve Description Including pain rating scale, medication(s)/side effects and  non-pharmacologic comfort measures Outcome: Progressing   Problem: Health Behavior/Discharge Planning: Goal: Ability to manage health-related needs will improve Outcome: Progressing   Problem: Clinical Measurements: Goal: Ability to maintain clinical measurements within normal limits will improve Outcome: Progressing Goal: Will remain free from infection Outcome: Progressing Goal: Diagnostic test results will improve Outcome: Progressing Goal: Respiratory complications will improve Outcome: Progressing Goal: Cardiovascular complication will be avoided Outcome: Progressing   Problem: Activity: Goal: Risk for activity intolerance will decrease Outcome: Progressing   Problem: Nutrition: Goal: Adequate nutrition will be maintained Outcome: Progressing   Problem: Coping: Goal: Level of anxiety will decrease Outcome: Progressing   Problem: Elimination: Goal: Will not experience complications related to bowel motility Outcome: Progressing Goal: Will not experience complications related to urinary retention Outcome: Progressing   Problem: Pain Managment: Goal: General experience of comfort will improve Outcome: Progressing   Problem: Safety: Goal: Ability to remain free from injury will improve Outcome: Progressing   Problem: Skin Integrity: Goal: Risk for impaired skin integrity will decrease Outcome: Progressing

## 2018-11-22 LAB — BASIC METABOLIC PANEL
Anion gap: 8 (ref 5–15)
BUN: 35 mg/dL — ABNORMAL HIGH (ref 8–23)
CO2: 25 mmol/L (ref 22–32)
Calcium: 10.5 mg/dL — ABNORMAL HIGH (ref 8.9–10.3)
Chloride: 103 mmol/L (ref 98–111)
Creatinine, Ser: 0.98 mg/dL (ref 0.61–1.24)
GFR calc Af Amer: >60 mL/min (ref >60)
GFR calc non Af Amer: >60 mL/min (ref >60)
Glucose, Bld: 108 mg/dL — ABNORMAL HIGH (ref 70–99)
Potassium: 4.4 mmol/L (ref 3.5–5.1)
Sodium: 136 mmol/L (ref 135–145)

## 2018-11-22 LAB — CBC
HCT: 36 % — ABNORMAL LOW (ref 39.0–52.0)
Hemoglobin: 11.1 g/dL — ABNORMAL LOW (ref 13.0–17.0)
MCH: 28 pg (ref 26.0–34.0)
MCHC: 30.8 g/dL (ref 30.0–36.0)
MCV: 90.9 fL (ref 80.0–100.0)
Platelets: 229 10*3/uL (ref 150–400)
RBC: 3.96 MIL/uL — ABNORMAL LOW (ref 4.22–5.81)
RDW: 15.8 % — ABNORMAL HIGH (ref 11.5–15.5)
WBC: 6.7 10*3/uL (ref 4.0–10.5)
nRBC: 0 % (ref 0.0–0.2)

## 2018-11-22 MED ORDER — GUAIFENESIN-DM 100-10 MG/5ML PO SYRP
5.0000 mL | ORAL_SOLUTION | ORAL | Status: DC | PRN
  Administered 2018-11-22 – 2018-11-23 (×2): 5 mL via ORAL
  Filled 2018-11-22 (×3): qty 5

## 2018-11-22 MED ORDER — LACTULOSE 10 GM/15ML PO SOLN
30.0000 g | Freq: Three times a day (TID) | ORAL | Status: DC
  Administered 2018-11-22: 30 g via ORAL
  Filled 2018-11-22 (×2): qty 45

## 2018-11-22 NOTE — Progress Notes (Signed)
PROGRESS NOTE        PATIENT DETAILS Name: [name] Age: 73 y.o. Sex: male Date of Birth: 1946/06/06 Admit Date: 10/01/2018 Admitting Physician Vianne Bulls, MD WER:[id number], Hollice Espy, MD  Brief Narrative: Patient is a 73 y.o. male with history of A. 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  Found to have a soft tissue infection of the left hip area, acute kidney injury and hyperkalemia.  ID, orthopedics consulted-underwent left hip aspiration on 2/14, infectious disease recommending 6 weeks of IV Invanz.   See below for further details  Subjective: Lying comfortably in bed-no events overnight-no chest pain or shortness of breath.  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  Was on suppressive Flagyl for Bacteroides fragilis infection-recommendations from ID are for 6 weeks of IV ertapenem-end date of November 25, 2018.  AKI: Likely hemodynamically mediated-secondary to soft tissue infection of the left hip, diuretic use.  Resolved with supportive care.  Follow electrolytes periodically.  Hyperkalemia: Suspect secondary to AKI with use of Aldactone and potassium supplementation.  Resolved.  History of left total hip arthroplasty with subsequent infection with Bacteroides fragilis: Patient was evaluated by infectious disease at [facility] was previously on Flagyl.  Now on IV Invanz-Flagyl has been discontinued.  Recurrent pleural effusion: Felt to be secondary to hepatic hydrothorax-most recent CT chest on 10/07/2018 shows a small pleural effusion-patient is asymptomatic at this point.  Doubt further work-up required-furthermore he has had a recent extensive work-up including multiple thoracocentesis   Cryptogenic cirrhosis: Prior hepatitis serology done earlier this month was negative-agree with plans outlined in prior discharge summary for outpatient follow-up.  Volume status is stable-he is awake and alert-continue Aldactone  Atrial fibrillation: Rate controlled with Cardizem.Continue Eliquis.  Chronic back and right shoulder pain: Continue Lidoderm patch, Tylenol and as needed narcotics.  Bipolar disorder: Stable-continue Lexapro, Neurontin  Constipation: Probably has constipation related to chronic use of narcotics-continue MiraLAX-add MiraLAX.  Right shoulder pain: Claims he has severe arthritis-and has required cortisone shots in the past-evaluated by orthopedics-supportive care for now.  Shoulder looks benign without any signs of infection.    OSA: Continue CPAP nightly  Morbid obesity  Acute on chronic debility/deconditioning: Has chronic debility/deconditioning at baseline-this is worsened due to acute illness/AKI/left hip infection.  Per social work-very difficult to place to SNF.  Insomnia: better with trazodone.  DVT Prophylaxis: Full dose anticoagulation with Eliquis  Code Status: Full code  Family Communication: Ex-wife at bedside  Disposition Plan: Remain inpatient- SNF on discharge when bed available  Antimicrobial agents: Anti-infectives (From admission, onward)   Start     Dose/Rate Route Frequency Ordered Stop   10/15/18 1500  cefTRIAXone (ROCEPHIN) 2 g in sodium chloride 0.9 % 100 mL IVPB  Status:  Discontinued     2 g 200 mL/hr over 30 Minutes Intravenous Every 24  hours  10/15/18 1434 10/15/18 1447   10/15/18 1500  ertapenem (INVANZ) 1,000 mg in sodium chloride 0.9 % 100 mL IVPB     1 g 200 mL/hr over 30 Minutes Intravenous Every 24 hours 10/15/18 1447 11/25/18 2359   10/08/18 0000  doxycycline (VIBRA-TABS) 100 MG tablet     100 mg Oral Every 12 hours 10/08/18 0921     10/06/18 1000  doxycycline (VIBRA-TABS) tablet 100 mg  Status:  Discontinued     100 mg Oral Every 12 hours 10/06/18 0726 10/11/18 1328   10/03/18 1800  metroNIDAZOLE (FLAGYL) tablet 500 mg  Status:  Discontinued     500 mg Oral Every 8 hours 10/03/18 1054 10/15/18 1447   10/03/18 0700  vancomycin (VANCOCIN) 2,000 mg in sodium chloride 0.9 % 500 mL IVPB  Status:  Discontinued     2,000 mg 250 mL/hr over 120 Minutes Intravenous Every 36 hours 10/01/18 1942 10/06/18 0726   10/02/18 0200  ceFEPIme (MAXIPIME) 2 g in sodium chloride 0.9 % 100 mL IVPB  Status:  Discontinued     2 g 200 mL/hr over 30 Minutes Intravenous Every 8 hours 10/01/18 1942 10/02/18 1008   10/01/18 1830  vancomycin (VANCOCIN) 2,500 mg in sodium chloride 0.9 % 500 mL IVPB     2,500 mg 250 mL/hr over 120 Minutes Intravenous STAT 10/01/18 1825 10/02/18 0126   10/01/18 1800  ceFEPIme (MAXIPIME) 2 g in sodium chloride 0.9 % 100 mL IVPB     2 g 200 mL/hr over 30 Minutes Intravenous  Once 10/01/18 1747 10/01/18 1903   10/01/18 1800  metroNIDAZOLE (FLAGYL) IVPB 500 mg  Status:  Discontinued     500 mg 100 mL/hr over 60 Minutes Intravenous Every 8 hours 10/01/18 1747 10/03/18 1054   10/01/18 1800  vancomycin (VANCOCIN) IVPB 1000 mg/200 mL premix  Status:  Discontinued     1,000 mg 200 mL/hr over 60 Minutes Intravenous  Once 10/01/18 1747 10/01/18 1825      Procedures: None  CONSULTS:  None  Time spent: 15- minutes-Greater than 50% of this time was spent in counseling, explanation of diagnosis, planning of further management, and coordination of care.  MEDICATIONS: Scheduled Meds: . apixaban  5 mg Oral BID  .  Chlorhexidine Gluconate Cloth  6 each Topical Daily  . cycloSPORINE  1 drop Both Eyes BID  . diclofenac sodium  2 g Topical BID  . diltiazem  120 mg Oral Daily  . escitalopram  10 mg Oral Daily  . gabapentin  100 mg Oral TID  . lactulose  30 g Oral TID  . lidocaine  1 patch Transdermal Q24H  . Melatonin  6 mg Oral QHS  . midodrine  10 mg Oral TID WC  . multivitamin with minerals  1 tablet Oral Daily  . pantoprazole  40 mg Oral Daily  . polyethylene glycol  17 g Oral BID  . Ensure Max Protein  11 oz Oral BID  . senna-docusate  2 tablet Oral BID  . spironolactone  50 mg Oral Daily  . thiamine  100 mg Oral Daily   Continuous Infusions: . sodium chloride 10 mL/hr at 11/21/18 1900  . ertapenem Stopped (11/21/18 1439)   PRN Meds:.sodium chloride, acetaminophen, albuterol, alum & mag hydroxide-simeth, diltiazem, hydrOXYzine, menthol-cetylpyridinium, [DISCONTINUED] ondansetron **OR** ondansetron (ZOFRAN) IV, oxyCODONE, sodium chloride flush, sodium phosphate, traZODone   PHYSICAL EXAM: Vital signs: Vitals:   11/21/18 2123 11/21/18 2127 11/22/18 0444 11/22/18 0500  BP:  105/74 109/72  Pulse: 95 92 91   Resp:      Temp: 98.3 F (36.8 C)  97.6 F (36.4 C)   TempSrc: Oral  Oral   SpO2: 96%  97%   Weight:    118.4 kg  Height:       Filed Weights   11/20/18 0606 11/21/18 0410 11/22/18 0500  Weight: 119.2 kg 118.6 kg 118.4 kg   Body mass index is 39.69 kg/m.   General appearance:Awake, alert, not in any distress.  Eyes:no scleral icterus. HEENT: Atraumatic and Normocephalic Neck: supple, no JVD. Resp:Good air entry bilaterally,no rales or rhonchi CVS: S1 S2 regular, no murmurs.  GI: Bowel sounds present, Non tender and not distended with no gaurding, rigidity or rebound. Extremities: B/L Lower Ext shows no edema, both legs are warm to touch Neurology:  Non focal Musculoskeletal:No digital cyanosis Skin:No Rash, warm and dry Wounds:N/A  LABORATORY DATA: CBC: Recent Labs   Lab 11/17/18 0405 11/22/18 0336  WBC 6.9 6.7  NEUTROABS 3.7  --   HGB 11.4* 11.1*  HCT 36.9* 36.0*  MCV 89.6 90.9  PLT 255 502    Basic Metabolic Panel: Recent Labs  Lab 11/17/18 0405 11/22/18 0336  NA 135 136  K 4.0 4.4  CL 106 103  CO2 23 25  GLUCOSE 100* 108*  BUN 39* 35*  CREATININE 1.07 0.98  CALCIUM 10.0 10.5*  MG 2.0  --     GFR: Estimated Creatinine Clearance: 85.2 mL/min (by C-G formula based on SCr of 0.98 mg/dL).  Liver Function Tests: No results for input(s): AST, ALT, ALKPHOS, BILITOT, PROT, ALBUMIN in the last 168 hours. No results for input(s): LIPASE, AMYLASE in the last 168 hours. No results for input(s): AMMONIA in the last 168 hours.  Coagulation Profile: No results for input(s): INR, PROTIME in the last 168 hours.  Cardiac Enzymes: No results for input(s): CKTOTAL, CKMB, CKMBINDEX, TROPONINI in the last 168 hours.  BNP (last 3 results) No results for input(s): PROBNP in the last 8760 hours.  HbA1C: No results for input(s): HGBA1C in the last 72 hours.  CBG: Recent Labs  Lab 11/18/18 2056 11/19/18 0757 11/20/18 2036  GLUCAP 139* 90 113*    Lipid Profile: No results for input(s): CHOL, HDL, LDLCALC, TRIG, CHOLHDL, LDLDIRECT in the last 72 hours.  Thyroid Function Tests: No results for input(s): TSH, T4TOTAL, FREET4, T3FREE, THYROIDAB in the last 72 hours.  Anemia Panel: No results for input(s): VITAMINB12, FOLATE, FERRITIN, TIBC, IRON, RETICCTPCT in the last 72 hours.  Urine analysis:    Component Value Date/Time   COLORURINE YELLOW 10/01/2018 1822   APPEARANCEUR CLEAR 10/01/2018 1822   APPEARANCEUR Clear 01/18/2018 1539   LABSPEC 1.010 10/01/2018 1822   PHURINE 5.0 10/01/2018 1822   GLUCOSEU NEGATIVE 10/01/2018 1822   HGBUR NEGATIVE 10/01/2018 1822   BILIRUBINUR NEGATIVE 10/01/2018 1822   BILIRUBINUR Negative 01/18/2018 1539   KETONESUR NEGATIVE 10/01/2018 1822   PROTEINUR NEGATIVE 10/01/2018 1822   UROBILINOGEN 0.2  09/06/2012 0213   NITRITE NEGATIVE 10/01/2018 1822   LEUKOCYTESUR NEGATIVE 10/01/2018 1822   LEUKOCYTESUR Negative 01/18/2018 1539    Sepsis Labs: Lactic Acid, Venous    Component Value Date/Time   LATICACIDVEN 1.7 10/01/2018 1833    MICROBIOLOGY: No results found for this or any previous visit (from the past 240 hour(s)).  RADIOLOGY STUDIES/RESULTS: Dg Abd Portable 1v  Result Date: 11/03/2018 CLINICAL DATA:  Constipation EXAM: PORTABLE ABDOMEN - 1 VIEW COMPARISON:  11/01/2018 FINDINGS: There is a non obstructive bowel  gas pattern. No supine evidence of free air. No organomegaly or suspicious calcification. No acute bony abnormality. IMPRESSION: No acute findings. Electronically Signed   By: Rolm Baptise M.D.   On: 11/03/2018 11:32   Dg Abd Portable 1v  Result Date: 11/01/2018 CLINICAL DATA:  Constipation EXAM: PORTABLE ABDOMEN - 1 VIEW COMPARISON:  CT 01/09/2015 FINDINGS: Large stool burden throughout the colon. No evidence of bowel obstruction or free air. Prior cholecystectomy. Surgical sutures in the left upper abdomen. IMPRESSION: Large stool burden.  No bowel obstruction or free air. Electronically Signed   By: Rolm Baptise M.D.   On: 11/01/2018 11:03     LOS: 47 days   Oren Binet, MD  [facility]  If 7PM-7AM, please contact night-coverage  Please page via [email or web address]-Password [email or web address]-click on MD name and type text message  11/22/2018, 11:11 AM

## 2018-11-22 NOTE — Plan of Care (Signed)
  Problem: Education: Goal: Knowledge of General Education information will improve Description Including pain rating scale, medication(s)/side effects and non-pharmacologic comfort measures Outcome: Progressing   Problem: Health Behavior/Discharge Planning: Goal: Ability to manage health-related needs will improve Outcome: Progressing   Problem: Clinical Measurements: Goal: Will remain free from infection Outcome: Progressing   Pt complaint of being anxious and having and episode of dizziness on day shift. Patient stated that he is worry to about everything when he leave here, reassure patient and provide general information about facility.

## 2018-11-22 NOTE — Progress Notes (Signed)
Sterile water added to home CPAP per pt's request. Advised pt to notify for RT if any further assistance is required.

## 2018-11-22 NOTE — Care Management Important Message (Signed)
Important Message  Patient Details  Name: [name] MRN: [id number] Date of Birth: [birth year]   Medicare Important Message Given:  Yes    [name] 11/22/2018, 4:32 PM

## 2018-11-22 NOTE — Progress Notes (Signed)
Physical Therapy Treatment Patient Details Name: [name] MRN: [id number] DOB: 11/18/1945 Today's Date: 11/22/2018    History of Present Illness 73 y.o. male admitted 10/01/18 with generalized weakness, falls and SOB. Workup revealed afib with recurrent R-side pleural effusion; AKI. Pt with L hip cellulitis with chronic L hip prosthetic joint infection; s/p joint aspiration 2/14. PMH includes HTN, afib on Eliquis, OSA, bipolar disorder, obesity, L THA with infection and multiple prolonged hospitalizations.    PT Comments    Pt extremely anxious once he began to mobilize.  Pt slow and guarded and required moderate assistance to advance BLEs to edge of bed and elevate trunk into sitting.  Bed pad used to assist patient to edge of bed.  Pt educated to challenge standing tolerance.  Pt able to tolerance 60 sec before complaints of feeling like he was "blacking out."  BP obtained and RN informed.  No LOC during session.  Pt remains severely limited due to his anxiety.  Continue to recommend SNF placement to improve strength and function before returning home.      Follow Up Recommendations  SNF     Equipment Recommendations  Wheelchair (measurements PT);Wheelchair cushion (measurements PT);Hospital bed;Other (comment)    Recommendations for Other Services       Precautions / Restrictions Precautions Precautions: Fall;Posterior Hip Precaution Booklet Issued: No Precaution Comments: h/o falls, very weak, per pt report posterior hip precautions. Restrictions Weight Bearing Restrictions: No    Mobility  Bed Mobility Overal bed mobility: Needs Assistance Bed Mobility: Supine to Sit;Sit to Supine     Supine to sit: Mod assist;HOB elevated     General bed mobility comments: Pt performed helicopter transfer to achieve sitting edge of bed.  Assistance to advance LEs and elevate trunk into sitting.  Pt continues to require mod assistance but significant decrease in time to complete.   Complaints of R shoulder pain after sitting edge of bed.    Transfers Overall transfer level: Needs assistance Equipment used: None;Ambulation equipment used(sara plus) Transfers: Sit to/from Stand Sit to Stand: Total assist;+2 safety/equipment         General transfer comment: Pt performed sit to stand with lift assistance from sara plus.  Pt cued for increasing standing tolerance and hip extension.  Pt able to stand x1 min before complaints of feeling like he will "Blackout." BP 95/67.  Informed nursing and elevated legs and reclined chair for safety.    Ambulation/Gait Ambulation/Gait assistance: (NT)               Stairs             Wheelchair Mobility    Modified Rankin (Stroke Patients Only)       Balance Overall balance assessment: Needs assistance   Sitting balance-[name]: Poor Sitting balance - Comments: heavily reliant on UE support     Standing balance-[name]: Poor Standing balance comment: remains heavily reliant on UE support and sara plus to remain standing.                              Cognition Arousal/Alertness: Awake/alert Behavior During Therapy: WFL for tasks assessed/performed Overall Cognitive Status: Within Functional Limits for tasks assessed                                 General Comments: fearful of moving on the bed  Exercises General Exercises - Lower Extremity Quad Sets: 10 reps    General Comments        Pertinent Vitals/Pain Pain Assessment: Faces Faces Pain Scale: Hurts even more Pain Location: R shoulder, back, and L knee all chronic arthritic pain.  Pain Descriptors / Indicators: Grimacing;Guarding Pain Intervention(s): Monitored during session;Repositioned    Home Living                      Prior Function            PT Goals (current goals can now be found in the care plan section) Acute Rehab PT Goals Patient Stated Goal: get to chair Potential to  Achieve Goals: Fair Progress towards PT goals: Progressing toward goals    Frequency    Min 3X/week      PT Plan Current plan remains appropriate    Co-evaluation              AM-PAC PT "6 Clicks" Mobility   Outcome Measure  Help needed turning from your back to your side while in a flat bed without using bedrails?: A Lot Help needed moving from lying on your back to sitting on the side of a flat bed without using bedrails?: A Lot Help needed moving to and from a bed to a chair (including a wheelchair)?: Total Help needed standing up from a chair using your arms (e.g., wheelchair or bedside chair)?: A Lot Help needed to walk in hospital room?: Total Help needed climbing 3-5 steps with a railing? : Total 6 Click Score: 9    End of Session Equipment Utilized During Treatment: Gait belt Activity Tolerance: Patient limited by pain;Patient limited by fatigue Patient left: in chair;with call bell/phone within reach;with chair alarm set Nurse Communication: Mobility status;Need for lift equipment(notified RN to return to bed at 12:30) PT Visit Diagnosis: Other abnormalities of gait and mobility (R26.89);Muscle weakness (generalized) (M62.81);Difficulty in walking, not elsewhere classified (R26.2);History of falling (Z91.81)     Time: 5638-7564 PT Time Calculation (min) (ACUTE ONLY): 23 min  Charges:  $Therapeutic Activity: 23-37 mins                     [name], PTA Acute Rehabilitation Services Pager [phone] Office [phone]     [name] [name] 11/22/2018, 12:02 PM

## 2018-11-23 MED ORDER — PREDNISONE 50 MG PO TABS
60.0000 mg | ORAL_TABLET | Freq: Every day | ORAL | Status: DC
  Administered 2018-11-23 – 2018-11-26 (×4): 60 mg via ORAL
  Filled 2018-11-23 (×5): qty 1

## 2018-11-23 MED ORDER — LACTULOSE 10 GM/15ML PO SOLN
30.0000 g | Freq: Two times a day (BID) | ORAL | Status: DC
  Administered 2018-11-24 – 2018-12-05 (×12): 30 g via ORAL
  Filled 2018-11-23 (×22): qty 45

## 2018-11-23 NOTE — Progress Notes (Signed)
Patient remains on Difficult to Place List.   Cedric Fishman LCSW [phone]

## 2018-11-23 NOTE — Progress Notes (Signed)
NCM spoke with pt regarding d/c plan, SNF vs home with home health services. Pt states he can't afford SNF placement and would like to go home with home health services. NCM reinforced 24/7 supervision/assistance need for safe d/c.Pt asked NCM to call daughter and share information given,  hoping daughter will assist with care once d/c. NCM called daughter and shared information. Daughter stated she would not be able to assist dad if d/c to home.Daughter stated ex- wife could  possible offer help. NCM got ok from pt to speak with ex-wife. Ex-wife called per NCM, voice message left...awaiting call back. Whitman Hero RN, BSN,CM

## 2018-11-23 NOTE — Progress Notes (Signed)
Patient to self administer CPAP using his home unit.

## 2018-11-23 NOTE — Progress Notes (Signed)
Physical Therapy Treatment Patient Details Name: [name] MRN: [id number] DOB: 1946/02/13 Today's Date: 11/23/2018    History of Present Illness 72 y.o. male admitted 10/01/18 with generalized weakness, falls and SOB. Workup revealed afib with recurrent R-side pleural effusion; AKI. Pt with L hip cellulitis with chronic L hip prosthetic joint infection; s/p joint aspiration 2/14. PMH includes HTN, afib on Eliquis, OSA, bipolar disorder, obesity, L THA with infection and multiple prolonged hospitalizations.    PT Comments    Patient received in bed, pleasant and willing to participate in PT today. Able to complete most of supine to sit transfer with min guard but did require ModA to completely come to EOB, demonstrates posterior lean while sitting bedside requiring MinA to maintain upright. Performed dependent transfer from bed to recliner with sara plus lift and totalAx2 assist for safety. Continues to demonstrate considerable weakness in standing as well as poor functional activity tolerance in general. VSS during session. He was left up in the recliner with chair alarm active, nursing staff aware of patient status, and all needs otherwise met.     Follow Up Recommendations  SNF     Equipment Recommendations  Wheelchair (measurements PT);Wheelchair cushion (measurements PT);Hospital bed;Other (comment)    Recommendations for Other Services       Precautions / Restrictions Precautions Precautions: Fall;Posterior Hip Precaution Booklet Issued: No Precaution Comments: h/o falls, very weak, per pt report posterior hip precautions. Restrictions Weight Bearing Restrictions: No    Mobility  Bed Mobility   Bed Mobility: Supine to Sit     Supine to sit: Mod assist;HOB elevated     General bed mobility comments: Patient able to perform majority of scoot around to EOB but required ModA to complete scoot to EOB. Posterior lean once sitting at EOB.   Transfers Overall transfer  level: Needs assistance Equipment used: Ambulation equipment used(sara plus )   Sit to Stand: Total assist;+2 safety/equipment Stand pivot transfers: Total assist;+2 safety/equipment       General transfer comment: use of sara plus for sit to stand and stand-pivot transfer into recliner   Ambulation/Gait             General Gait Details: unable   Stairs             Wheelchair Mobility    Modified Rankin (Stroke Patients Only)       Balance Overall balance assessment: Needs assistance Sitting-balance support: Feet supported;Bilateral upper extremity supported Sitting balance-[name]: Poor Sitting balance - Comments: heavily reliant on UE support Postural control: Posterior lean Standing balance support: Bilateral upper extremity supported Standing balance-[name]: Poor Standing balance comment: remains heavily reliant on UE support and sara plus to remain standing.                              Cognition Arousal/Alertness: Awake/alert Behavior During Therapy: WFL for tasks assessed/performed Overall Cognitive Status: Within Functional Limits for tasks assessed                                        Exercises      General Comments        Pertinent Vitals/Pain Pain Assessment: 0-10 Pain Score: 9  Pain Location: R shoulder- chronic arthritic pain  Pain Descriptors / Indicators: Grimacing;Guarding Pain Intervention(s): Monitored during session;Repositioned    Home Living  Prior Function            PT Goals (current goals can now be found in the care plan section) Acute Rehab PT Goals Patient Stated Goal: get to chair PT Goal Formulation: With patient Time For Goal Achievement: 11/29/18 Potential to Achieve Goals: Fair Progress towards PT goals: Progressing toward goals    Frequency    Min 3X/week      PT Plan Current plan remains appropriate    Co-evaluation               AM-PAC PT "6 Clicks" Mobility   Outcome Measure  Help needed turning from your back to your side while in a flat bed without using bedrails?: A Lot Help needed moving from lying on your back to sitting on the side of a flat bed without using bedrails?: A Lot Help needed moving to and from a bed to a chair (including a wheelchair)?: Total Help needed standing up from a chair using your arms (e.g., wheelchair or bedside chair)?: Total Help needed to walk in hospital room?: Total Help needed climbing 3-5 steps with a railing? : Total 6 Click Score: 8    End of Session Equipment Utilized During Treatment: Gait belt Activity Tolerance: Patient limited by pain;Patient limited by fatigue Patient left: in chair;with call bell/phone within reach;with chair alarm set Nurse Communication: Mobility status;Need for lift equipment;Other (comment)(POC for return to bed ) PT Visit Diagnosis: Other abnormalities of gait and mobility (R26.89);Muscle weakness (generalized) (M62.81);Difficulty in walking, not elsewhere classified (R26.2);History of falling (Z91.81)     Time: 1522-1600 PT Time Calculation (min) (ACUTE ONLY): 38 min  Charges:  $Therapeutic Exercise: 38-52 mins                     Deniece Ree PT, DPT, CBIS  Supplemental Physical Therapist [place]    Pager [phone] Acute Rehab Office [phone]

## 2018-11-23 NOTE — Progress Notes (Signed)
PROGRESS NOTE        PATIENT DETAILS Name: [name] Age: 73 y.o. Sex: male Date of Birth: 1945-12-03 Admit Date: 10/01/2018 Admitting Physician Vianne Bulls, MD [id number], Hollice Espy, MD  Brief Narrative: Patient is a 73 y.o. male with history of A. 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  Found to have a soft tissue infection of the left hip area, acute kidney injury and hyperkalemia.  ID, orthopedics consulted-underwent left hip aspiration on 2/14, infectious disease recommending 6 weeks of IV Invanz.   See below for further details  Subjective: Complains of worsening right shoulder pain-wants to try a course of oral steroids.  Had too many bowel movements yesterday-denies any chest pain or shortness of breath.  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  Was on suppressive Flagyl for Bacteroides fragilis infection-recommendations from ID are for 6 weeks of IV ertapenem-end date of November 25, 2018.  AKI: Likely hemodynamically mediated-secondary to soft tissue infection of the left hip, diuretic use.  Resolved with supportive care.  Follow electrolytes periodically.  Hyperkalemia: Suspect secondary to AKI with use of Aldactone and potassium supplementation.  Resolved.  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  Now on IV Invanz-Flagyl has been discontinued.  Recurrent pleural effusion: Felt to be secondary to hepatic hydrothorax-most recent CT chest on 10/07/2018 shows a small pleural effusion-patient is asymptomatic at this point.  Doubt further work-up required-furthermore he has had a recent extensive work-up including multiple thoracocentesis   Cryptogenic cirrhosis: Prior hepatitis serology done earlier this month was negative-agree with plans outlined in prior discharge summary for outpatient follow-up.  Volume status is stable-he is awake and alert-continue Aldactone  Atrial fibrillation: Rate controlled with Cardizem.Continue Eliquis.  Chronic back and right shoulder pain: Continue Lidoderm patch, Tylenol and as needed narcotics.  Bipolar disorder: Stable-continue Lexapro, Neurontin  Constipation: Probably has constipation related to chronic use of narcotics-continue MiraLAX-and lactulose-but decrease lactulose to twice daily dosing.  Right shoulder pain: Claims he has severe arthritis-and has required cortisone shots in the past-evaluated by orthopedics-supportive care for now.  Continues to complain of severe right shoulder pain-we will give a short course of oral steroids to see if this helps.  OSA: Continue CPAP nightly  Morbid obesity  Acute on chronic debility/deconditioning: Has chronic debility/deconditioning at baseline-this is worsened due to acute illness/AKI/left hip infection.  Per social work-very difficult to place to SNF.  Insomnia: better with trazodone.  DVT Prophylaxis: Full dose anticoagulation with Eliquis  Code Status: Full code  Family Communication: Ex-wife at bedside  Disposition Plan: Remain inpatient- SNF on discharge when bed available  Antimicrobial agents: Anti-infectives (From admission, onward)   Start     Dose/Rate Route  Frequency Ordered Stop  10/15/18 1500  cefTRIAXone (ROCEPHIN) 2 g in sodium chloride 0.9 % 100 mL IVPB  Status:  Discontinued     2 g 200 mL/hr over 30 Minutes Intravenous Every 24 hours 10/15/18 1434 10/15/18 1447   10/15/18 1500  ertapenem (INVANZ) 1,000 mg in sodium chloride 0.9 % 100 mL IVPB     1 g 200 mL/hr over 30 Minutes Intravenous Every 24 hours 10/15/18 1447 11/25/18 2359   10/08/18 0000  doxycycline (VIBRA-TABS) 100 MG tablet     100 mg Oral Every 12 hours 10/08/18 0921     10/06/18 1000  doxycycline (VIBRA-TABS) tablet 100 mg  Status:  Discontinued     100 mg Oral Every 12 hours 10/06/18 0726 10/11/18 1328   10/03/18 1800  metroNIDAZOLE (FLAGYL) tablet 500 mg  Status:  Discontinued     500 mg Oral Every 8 hours 10/03/18 1054 10/15/18 1447   10/03/18 0700  vancomycin (VANCOCIN) 2,000 mg in sodium chloride 0.9 % 500 mL IVPB  Status:  Discontinued     2,000 mg 250 mL/hr over 120 Minutes Intravenous Every 36 hours 10/01/18 1942 10/06/18 0726   10/02/18 0200  ceFEPIme (MAXIPIME) 2 g in sodium chloride 0.9 % 100 mL IVPB  Status:  Discontinued     2 g 200 mL/hr over 30 Minutes Intravenous Every 8 hours 10/01/18 1942 10/02/18 1008   10/01/18 1830  vancomycin (VANCOCIN) 2,500 mg in sodium chloride 0.9 % 500 mL IVPB     2,500 mg 250 mL/hr over 120 Minutes Intravenous STAT 10/01/18 1825 10/02/18 0126   10/01/18 1800  ceFEPIme (MAXIPIME) 2 g in sodium chloride 0.9 % 100 mL IVPB     2 g 200 mL/hr over 30 Minutes Intravenous  Once 10/01/18 1747 10/01/18 1903   10/01/18 1800  metroNIDAZOLE (FLAGYL) IVPB 500 mg  Status:  Discontinued     500 mg 100 mL/hr over 60 Minutes Intravenous Every 8 hours 10/01/18 1747 10/03/18 1054   10/01/18 1800  vancomycin (VANCOCIN) IVPB 1000 mg/200 mL premix  Status:  Discontinued     1,000 mg 200 mL/hr over 60 Minutes Intravenous  Once 10/01/18 1747 10/01/18 1825      Procedures: None  CONSULTS:  None  Time spent: 15- minutes-Greater than 50% of  this time was spent in counseling, explanation of diagnosis, planning of further management, and coordination of care.  MEDICATIONS: Scheduled Meds: . apixaban  5 mg Oral BID  . Chlorhexidine Gluconate Cloth  6 each Topical Daily  . cycloSPORINE  1 drop Both Eyes BID  . diclofenac sodium  2 g Topical BID  . diltiazem  120 mg Oral Daily  . escitalopram  10 mg Oral Daily  . gabapentin  100 mg Oral TID  . lactulose  30 g Oral TID  . lidocaine  1 patch Transdermal Q24H  . Melatonin  6 mg Oral QHS  . midodrine  10 mg Oral TID WC  . multivitamin with minerals  1 tablet Oral Daily  . pantoprazole  40 mg Oral Daily  . polyethylene glycol  17 g Oral BID  . predniSONE  60 mg Oral Q breakfast  . Ensure Max Protein  11 oz Oral BID  . senna-docusate  2 tablet Oral BID  . spironolactone  50 mg Oral Daily  . thiamine  100 mg Oral Daily   Continuous Infusions: . sodium chloride 10 mL/hr at 11/21/18 1900  . ertapenem 1,000 mg (11/22/18 1457)   PRN Meds:.sodium chloride, acetaminophen, albuterol, alum &  mag hydroxide-simeth, diltiazem, guaiFENesin-dextromethorphan, hydrOXYzine, menthol-cetylpyridinium, [DISCONTINUED] ondansetron **OR** ondansetron (ZOFRAN) IV, oxyCODONE, sodium chloride flush, sodium phosphate, traZODone   PHYSICAL EXAM: Vital signs: Vitals:   11/22/18 1329 11/22/18 2149 11/23/18 0500 11/23/18 0558  BP: 124/66 92/66  97/68  Pulse: (!) 104 92  83  Resp: 16     Temp: 98.7 F (37.1 C) 98.8 F (37.1 C)  98.5 F (36.9 C)  TempSrc: Oral Oral  Oral  SpO2: 95% 95%  96%  Weight:   118.7 kg   Height:       Filed Weights   11/21/18 0410 11/22/18 0500 11/23/18 0500  Weight: 118.6 kg 118.4 kg 118.7 kg   Body mass index is 39.79 kg/m.   General appearance:Awake, alert, not in any distress.  Eyes:no scleral icterus. HEENT: Atraumatic and Normocephalic Neck: supple, no JVD. Resp:Good air entry bilaterally,no rales or rhonchi CVS: S1 S2 regular, no murmurs.  GI: Bowel sounds  present, Non tender and not distended with no gaurding, rigidity or rebound. Extremities: B/L Lower Ext shows no edema, both legs are warm to touch Neurology:  Non focal Musculoskeletal:No digital cyanosis Skin:No Rash, warm and dry Wounds:N/A  LABORATORY DATA: CBC: Recent Labs  Lab 11/17/18 0405 11/22/18 0336  WBC 6.9 6.7  NEUTROABS 3.7  --   HGB 11.4* 11.1*  HCT 36.9* 36.0*  MCV 89.6 90.9  PLT 255 646    Basic Metabolic Panel: Recent Labs  Lab 11/17/18 0405 11/22/18 0336  NA 135 136  K 4.0 4.4  CL 106 103  CO2 23 25  GLUCOSE 100* 108*  BUN 39* 35*  CREATININE 1.07 0.98  CALCIUM 10.0 10.5*  MG 2.0  --     GFR: Estimated Creatinine Clearance: 85.3 mL/min (by C-G formula based on SCr of 0.98 mg/dL).  Liver Function Tests: No results for input(s): AST, ALT, ALKPHOS, BILITOT, PROT, ALBUMIN in the last 168 hours. No results for input(s): LIPASE, AMYLASE in the last 168 hours. No results for input(s): AMMONIA in the last 168 hours.  Coagulation Profile: No results for input(s): INR, PROTIME in the last 168 hours.  Cardiac Enzymes: No results for input(s): CKTOTAL, CKMB, CKMBINDEX, TROPONINI in the last 168 hours.  BNP (last 3 results) No results for input(s): PROBNP in the last 8760 hours.  HbA1C: No results for input(s): HGBA1C in the last 72 hours.  CBG: Recent Labs  Lab 11/18/18 2056 11/19/18 0757 11/20/18 2036  GLUCAP 139* 90 113*    Lipid Profile: No results for input(s): CHOL, HDL, LDLCALC, TRIG, CHOLHDL, LDLDIRECT in the last 72 hours.  Thyroid Function Tests: No results for input(s): TSH, T4TOTAL, FREET4, T3FREE, THYROIDAB in the last 72 hours.  Anemia Panel: No results for input(s): VITAMINB12, FOLATE, FERRITIN, TIBC, IRON, RETICCTPCT in the last 72 hours.  Urine analysis:    Component Value Date/Time   COLORURINE YELLOW 10/01/2018 1822   APPEARANCEUR CLEAR 10/01/2018 1822   APPEARANCEUR Clear 01/18/2018 1539   LABSPEC 1.010  10/01/2018 1822   PHURINE 5.0 10/01/2018 1822   GLUCOSEU NEGATIVE 10/01/2018 1822   HGBUR NEGATIVE 10/01/2018 1822   BILIRUBINUR NEGATIVE 10/01/2018 1822   BILIRUBINUR Negative 01/18/2018 [place] 10/01/2018 1822   PROTEINUR NEGATIVE 10/01/2018 1822   UROBILINOGEN 0.2 09/06/2012 0213   NITRITE NEGATIVE 10/01/2018 1822   LEUKOCYTESUR NEGATIVE 10/01/2018 1822   LEUKOCYTESUR Negative 01/18/2018 1539    Sepsis Labs: Lactic Acid, Venous    Component Value Date/Time   LATICACIDVEN 1.7 10/01/2018 1833  MICROBIOLOGY: No results found for this or any previous visit (from the past 240 hour(s)).  RADIOLOGY STUDIES/RESULTS: Dg Abd Portable 1v  Result Date: 11/03/2018 CLINICAL DATA:  Constipation EXAM: PORTABLE ABDOMEN - 1 VIEW COMPARISON:  11/01/2018 FINDINGS: There is a non obstructive bowel gas pattern. No supine evidence of free air. No organomegaly or suspicious calcification. No acute bony abnormality. IMPRESSION: No acute findings. Electronically Signed   By: Rolm Baptise M.D.   On: 11/03/2018 11:32   Dg Abd Portable 1v  Result Date: 11/01/2018 CLINICAL DATA:  Constipation EXAM: PORTABLE ABDOMEN - 1 VIEW COMPARISON:  CT 01/09/2015 FINDINGS: Large stool burden throughout the colon. No evidence of bowel obstruction or free air. Prior cholecystectomy. Surgical sutures in the left upper abdomen. IMPRESSION: Large stool burden.  No bowel obstruction or free air. Electronically Signed   By: Rolm Baptise M.D.   On: 11/01/2018 11:03     LOS: 54 days   Oren Binet, MD  [facility]  If 7PM-7AM, please contact night-coverage  Please page via [email or web address]-Password [email or web address]-click on MD name and type text message  11/23/2018, 10:10 AM

## 2018-11-24 NOTE — Progress Notes (Signed)
PT Cancellation Note  Patient Details Name: [name] MRN: [id number] DOB: Aug 01, 1946   Cancelled Treatment:    Reason Eval/Treat Not Completed: Patient declined, no reason specified attempted to work with patient, he politely declines up to chair or exercises right now due to new onset diarrhea, willing to try to participate in exercises and up to chair with nursing staff later this afternoon however. Will attempt to return as/if time and schedule allow.    Deniece Ree PT, DPT, CBIS  Supplemental Physical Therapist [facility]    Pager [phone] Acute Rehab Office [phone]

## 2018-11-24 NOTE — Progress Notes (Signed)
PROGRESS NOTE        PATIENT DETAILS Name: [name] Age: 73 y.o. Sex: male Date of Birth: 1945/09/11 Admit Date: 10/01/2018 Admitting Physician Vianne Bulls, MD [id number], Hollice Espy, MD  Brief Narrative: Patient is a 73 y.o. male with history of A. 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  Found to have a soft tissue infection of the left hip area, acute kidney injury and hyperkalemia.  ID, orthopedics consulted-underwent left hip aspiration on 2/14, infectious disease recommending 6 weeks of IV Invanz.   See below for further details  Subjective: Started on steroids on 3/24 for worsening right shoulder pain-does not think it has improved as of yet-but wants to try 2 more days of prednisone.  No BM yesterday.  Lying comfortably in bed.  Denies any chest pain or shortness of breath.  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  Was on suppressive Flagyl for Bacteroides fragilis infection-recommendations from ID are for 6 weeks of IV ertapenem-end date of November 25, 2018.  AKI: Likely hemodynamically mediated-secondary to soft tissue infection of the left hip, diuretic use.  Resolved with supportive care.  Follow electrolytes periodically.  Hyperkalemia: Suspect secondary to AKI with use of Aldactone and potassium supplementation.   Resolved.  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  Now on IV Invanz (as above)-Flagyl has been discontinued.  Recurrent pleural effusion: Felt to be secondary to hepatic hydrothorax-most recent CT chest on 10/07/2018 shows a small pleural effusion-patient is asymptomatic at this point.  Doubt further work-up required-furthermore he has had a recent extensive work-up including multiple thoracocentesis   Cryptogenic cirrhosis: Prior hepatitis serology done earlier this month was negative-agree with plans outlined in prior discharge summary for outpatient follow-up.  Volume status is stable-he is awake and alert-continue Aldactone  Atrial fibrillation: Rate controlled with Cardizem.Continue Eliquis.  Chronic back and right shoulder pain: Continue Lidoderm patch, Tylenol and as needed narcotics.  Bipolar disorder: Stable-continue Lexapro, Neurontin  Constipation: Probably has constipation related to chronic use of narcotics-continue MiraLAX-and lactulose-but decrease lactulose to twice daily dosing.  Right shoulder pain: Claims he has severe arthritis-and has required cortisone shots in the past-evaluated by orthopedics-supportive care for now.  Continues to complain of severe right shoulder pain-we will give a short course of oral steroids-started on 3/24-no improvement so far-he wants to try a total of 3 days-to see if any improvement-if no improvement can discontinue steroids.  OSA: Continue CPAP nightly  Morbid obesity  Acute on chronic debility/deconditioning: Has chronic debility/deconditioning at baseline-this is worsened due to acute illness/AKI/left hip infection.  Per social work-very difficult to place to SNF-patient would likely remain in the hospital for a while before placement can be arranged.  Per social work-cannot be  discharged  home as he has active adult protective service case  Insomnia: better with trazodone.  DVT Prophylaxis: Full dose anticoagulation with Eliquis  Code Status: Full code  Family Communication: Ex-wife at bedside  Disposition Plan: Remain inpatient- SNF on discharge when bed available  Antimicrobial agents: Anti-infectives (From admission, onward)   Start     Dose/Rate Route Frequency Ordered Stop   10/15/18 1500  cefTRIAXone (ROCEPHIN) 2 g in sodium chloride 0.9 % 100 mL IVPB  Status:  Discontinued     2 g 200 mL/hr over 30 Minutes Intravenous Every 24 hours 10/15/18 1434 10/15/18 1447   10/15/18 1500  ertapenem (INVANZ) 1,000 mg in sodium chloride 0.9 % 100 mL IVPB     1 g 200 mL/hr over 30 Minutes Intravenous Every 24 hours 10/15/18 1447 11/25/18 2359   10/08/18 0000  doxycycline (VIBRA-TABS) 100 MG tablet     100 mg Oral Every 12 hours 10/08/18 0921     10/06/18 1000  doxycycline (VIBRA-TABS) tablet 100 mg  Status:  Discontinued     100 mg Oral Every 12 hours 10/06/18 0726 10/11/18 1328   10/03/18 1800  metroNIDAZOLE (FLAGYL) tablet 500 mg  Status:  Discontinued     500 mg Oral Every 8 hours 10/03/18 1054 10/15/18 1447   10/03/18 0700  vancomycin (VANCOCIN) 2,000 mg in sodium chloride 0.9 % 500 mL IVPB  Status:  Discontinued     2,000 mg 250 mL/hr over 120 Minutes Intravenous Every 36 hours 10/01/18 1942 10/06/18 0726   10/02/18 0200  ceFEPIme (MAXIPIME) 2 g in sodium chloride 0.9 % 100 mL IVPB  Status:  Discontinued     2 g 200 mL/hr over 30 Minutes Intravenous Every 8 hours 10/01/18 1942 10/02/18 1008   10/01/18 1830  vancomycin (VANCOCIN) 2,500 mg in sodium chloride 0.9 % 500 mL IVPB     2,500 mg 250 mL/hr over 120 Minutes Intravenous STAT 10/01/18 1825 10/02/18 0126   10/01/18 1800  ceFEPIme (MAXIPIME) 2 g in sodium chloride 0.9 % 100 mL IVPB     2 g 200 mL/hr over 30 Minutes Intravenous  Once 10/01/18 1747 10/01/18 1903   10/01/18 1800  metroNIDAZOLE  (FLAGYL) IVPB 500 mg  Status:  Discontinued     500 mg 100 mL/hr over 60 Minutes Intravenous Every 8 hours 10/01/18 1747 10/03/18 1054   10/01/18 1800  vancomycin (VANCOCIN) IVPB 1000 mg/200 mL premix  Status:  Discontinued     1,000 mg 200 mL/hr over 60 Minutes Intravenous  Once 10/01/18 1747 10/01/18 1825      Procedures: None  CONSULTS:  None  Time spent: 15- minutes-Greater than 50% of this time was spent in counseling, explanation of diagnosis, planning of further management, and coordination of care.  MEDICATIONS: Scheduled Meds: . apixaban  5 mg Oral BID  . Chlorhexidine Gluconate Cloth  6 each Topical Daily  . cycloSPORINE  1 drop Both Eyes BID  . diclofenac sodium  2 g Topical BID  . diltiazem  120 mg Oral Daily  . escitalopram  10 mg Oral Daily  . gabapentin  100 mg Oral TID  . lactulose  30 g Oral BID  . lidocaine  1 patch Transdermal Q24H  . Melatonin  6 mg Oral QHS  . midodrine  10 mg Oral TID WC  . multivitamin with minerals  1 tablet Oral Daily  . pantoprazole  40 mg Oral Daily  . polyethylene glycol  17 g Oral BID  . predniSONE  60  mg Oral Q breakfast  . Ensure Max Protein  11 oz Oral BID  . senna-docusate  2 tablet Oral BID  . spironolactone  50 mg Oral Daily  . thiamine  100 mg Oral Daily   Continuous Infusions: . sodium chloride 10 mL/hr at 11/21/18 1900  . ertapenem 1,000 mg (11/23/18 1440)   PRN Meds:.sodium chloride, acetaminophen, albuterol, alum & mag hydroxide-simeth, diltiazem, guaiFENesin-dextromethorphan, hydrOXYzine, menthol-cetylpyridinium, [DISCONTINUED] ondansetron **OR** ondansetron (ZOFRAN) IV, oxyCODONE, sodium chloride flush, sodium phosphate, traZODone   PHYSICAL EXAM: Vital signs: Vitals:   11/23/18 2109 11/23/18 2112 11/24/18 0434 11/24/18 0447  BP: 95/61 91/69 103/74   Pulse: (!) 103 99 (!) 102   Resp: 18  18   Temp: 98.3 F (36.8 C)  (!) 97.5 F (36.4 C)   TempSrc: Oral  Oral   SpO2: 93% 93% 92%   Weight:    118.2 kg   Height:       Filed Weights   11/22/18 0500 11/23/18 0500 11/24/18 0447  Weight: 118.4 kg 118.7 kg 118.2 kg   Body mass index is 39.62 kg/m.   General appearance:Awake, alert, not in any distress.  Eyes:no scleral icterus. HEENT: Atraumatic and Normocephalic Neck: supple, no JVD. Resp:Good air entry bilaterally,no rales or rhonchi CVS: S1 S2 regular, no murmurs.  GI: Bowel sounds present, Non tender and not distended with no gaurding, rigidity or rebound. Extremities: B/L Lower Ext shows no edema, both legs are warm to touch Neurology:  Non focal Musculoskeletal:No digital cyanosis Skin:No Rash, warm and dry Wounds:N/A  LABORATORY DATA: CBC: Recent Labs  Lab 11/22/18 0336  WBC 6.7  HGB 11.1*  HCT 36.0*  MCV 90.9  PLT 671    Basic Metabolic Panel: Recent Labs  Lab 11/22/18 0336  NA 136  K 4.4  CL 103  CO2 25  GLUCOSE 108*  BUN 35*  CREATININE 0.98  CALCIUM 10.5*    GFR: Estimated Creatinine Clearance: 85.1 mL/min (by C-G formula based on SCr of 0.98 mg/dL).  Liver Function Tests: No results for input(s): AST, ALT, ALKPHOS, BILITOT, PROT, ALBUMIN in the last 168 hours. No results for input(s): LIPASE, AMYLASE in the last 168 hours. No results for input(s): AMMONIA in the last 168 hours.  Coagulation Profile: No results for input(s): INR, PROTIME in the last 168 hours.  Cardiac Enzymes: No results for input(s): CKTOTAL, CKMB, CKMBINDEX, TROPONINI in the last 168 hours.  BNP (last 3 results) No results for input(s): PROBNP in the last 8760 hours.  HbA1C: No results for input(s): HGBA1C in the last 72 hours.  CBG: Recent Labs  Lab 11/18/18 2056 11/19/18 0757 11/20/18 2036  GLUCAP 139* 90 113*    Lipid Profile: No results for input(s): CHOL, HDL, LDLCALC, TRIG, CHOLHDL, LDLDIRECT in the last 72 hours.  Thyroid Function Tests: No results for input(s): TSH, T4TOTAL, FREET4, T3FREE, THYROIDAB in the last 72 hours.  Anemia Panel: No results  for input(s): VITAMINB12, FOLATE, FERRITIN, TIBC, IRON, RETICCTPCT in the last 72 hours.  Urine analysis:    Component Value Date/Time   COLORURINE YELLOW 10/01/2018 1822   APPEARANCEUR CLEAR 10/01/2018 1822   APPEARANCEUR Clear 01/18/2018 1539   LABSPEC 1.010 10/01/2018 1822   PHURINE 5.0 10/01/2018 1822   GLUCOSEU NEGATIVE 10/01/2018 1822   HGBUR NEGATIVE 10/01/2018 1822   BILIRUBINUR NEGATIVE 10/01/2018 1822   BILIRUBINUR Negative 01/18/2018 1539   KETONESUR NEGATIVE 10/01/2018 1822   PROTEINUR NEGATIVE 10/01/2018 1822   UROBILINOGEN 0.2 09/06/2012 0213   NITRITE NEGATIVE  10/01/2018 1822   LEUKOCYTESUR NEGATIVE 10/01/2018 1822   LEUKOCYTESUR Negative 01/18/2018 1539    Sepsis Labs: Lactic Acid, Venous    Component Value Date/Time   LATICACIDVEN 1.7 10/01/2018 1833    MICROBIOLOGY: No results found for this or any previous visit (from the past 240 hour(s)).  RADIOLOGY STUDIES/RESULTS: Dg Abd Portable 1v  Result Date: 11/03/2018 CLINICAL DATA:  Constipation EXAM: PORTABLE ABDOMEN - 1 VIEW COMPARISON:  11/01/2018 FINDINGS: There is a non obstructive bowel gas pattern. No supine evidence of free air. No organomegaly or suspicious calcification. No acute bony abnormality. IMPRESSION: No acute findings. Electronically Signed   By: Rolm Baptise M.D.   On: 11/03/2018 11:32   Dg Abd Portable 1v  Result Date: 11/01/2018 CLINICAL DATA:  Constipation EXAM: PORTABLE ABDOMEN - 1 VIEW COMPARISON:  CT 01/09/2015 FINDINGS: Large stool burden throughout the colon. No evidence of bowel obstruction or free air. Prior cholecystectomy. Surgical sutures in the left upper abdomen. IMPRESSION: Large stool burden.  No bowel obstruction or free air. Electronically Signed   By: Rolm Baptise M.D.   On: 11/01/2018 11:03     LOS: 22 days   Oren Binet, MD  [facility]  If 7PM-7AM, please contact night-coverage  Please page via [email or web address]-Password [email or web address]-click on MD name and type text  message  11/24/2018, 8:45 AM

## 2018-11-24 NOTE — Progress Notes (Signed)
Physical Therapy Treatment Patient Details Name: [name] MRN: [id number] DOB: [birth year] Today's Date: 11/24/2018    History of Present Illness 73 y.o. male admitted 10/01/18 with generalized weakness, falls and SOB. Workup revealed afib with recurrent R-side pleural effusion; AKI. Pt with L hip cellulitis with chronic L hip prosthetic joint infection; s/p joint aspiration 2/14. PMH includes HTN, afib on Eliquis, OSA, bipolar disorder, obesity, L THA with infection and multiple prolonged hospitalizations.    PT Comments    Returned to patient's room where he was now feeling better and able to participate in therapy; unable to perform sara plus transfer today due to not having PT tech/nurse tech needed to help with other patients so focused on functional exercise and strengthening today for B LEs and B UEs. Noted some relief of R knee pain with stretching of anterior tibialis and calf. All exercises performed to tolerance per patient's pain and comfort levels, and nurse tech made aware of patient request to hoyer to chair following PT session. He was left in bed with all needs met, bed alarm active this afternoon.     Follow Up Recommendations  SNF     Equipment Recommendations  Wheelchair (measurements PT);Wheelchair cushion (measurements PT);Hospital bed;Other (comment)    Recommendations for Other Services       Precautions / Restrictions Precautions Precautions: Fall;Posterior Hip Precaution Booklet Issued: No Precaution Comments: h/o falls, very weak, per pt report posterior hip precautions. Restrictions Weight Bearing Restrictions: No    Mobility  Bed Mobility               General bed mobility comments: focus on exercise   Transfers                 General transfer comment: focus on exercise   Ambulation/Gait             General Gait Details: focus on exercise    Stairs             Wheelchair Mobility    Modified Rankin (Stroke  Patients Only)       Balance       Sitting balance - Comments: heavily reliant on UE support                                    Cognition Arousal/Alertness: Awake/alert Behavior During Therapy: WFL for tasks assessed/performed Overall Cognitive Status: Within Functional Limits for tasks assessed                                        Exercises      General Comments        Pertinent Vitals/Pain Pain Assessment: 0-10 Pain Score: 3  Pain Location: R shoulder, R knee- chronic arthritic pain  Pain Descriptors / Indicators: Grimacing;Guarding Pain Intervention(s): Monitored during session;Repositioned;Limited activity within patient's tolerance    Home Living                      Prior Function            PT Goals (current goals can now be found in the care plan section) Acute Rehab PT Goals Patient Stated Goal: get to chair PT Goal Formulation: With patient Time For Goal Achievement: 11/29/18 Potential to Achieve Goals: Fair Progress towards PT goals: Progressing  toward goals    Frequency    Min 3X/week      PT Plan Current plan remains appropriate    Co-evaluation              AM-PAC PT "6 Clicks" Mobility   Outcome Measure  Help needed turning from your back to your side while in a flat bed without using bedrails?: A Lot Help needed moving from lying on your back to sitting on the side of a flat bed without using bedrails?: A Lot Help needed moving to and from a bed to a chair (including a wheelchair)?: Total Help needed standing up from a chair using your arms (e.g., wheelchair or bedside chair)?: Total Help needed to walk in hospital room?: Total Help needed climbing 3-5 steps with a railing? : Total 6 Click Score: 8    End of Session   Activity Tolerance: Patient tolerated treatment well Patient left: in bed;with call bell/phone within reach;with bed alarm set Nurse Communication: Other  (comment)(nurse tech with request to hoyer to chair ) PT Visit Diagnosis: Other abnormalities of gait and mobility (R26.89);Muscle weakness (generalized) (M62.81);Difficulty in walking, not elsewhere classified (R26.2);History of falling (Z91.81)     Time: 5681-2751 PT Time Calculation (min) (ACUTE ONLY): 40 min  Charges:  $Therapeutic Exercise: 38-52 mins                     Deniece Ree PT, DPT, CBIS  Supplemental Physical Therapist [place]    Pager [phone] Acute Rehab Office [phone]

## 2018-11-24 NOTE — Progress Notes (Signed)
RT added sterile water to patients home CPAP machine.  RT placed CPAP within patient reach.

## 2018-11-25 NOTE — Progress Notes (Signed)
Physical Therapy Treatment Patient Details Name: [name] MRN: [id number] DOB: 1946-02-05 Today's Date: 11/25/2018    History of Present Illness 73 y.o. male admitted 10/01/18 with generalized weakness, falls and SOB. Workup revealed afib with recurrent R-side pleural effusion; AKI. Pt with L hip cellulitis with chronic L hip prosthetic joint infection; s/p joint aspiration 2/14. PMH includes HTN, afib on Eliquis, OSA, bipolar disorder, obesity, L THA with infection and multiple prolonged hospitalizations.    PT Comments    Patient received in bed, willing to participate in PT session today. He continues to require heavy ModA for supine to sit and continues to demonstrate posterior lean while at EOB. Performed totalA sit to stand and stand pivot to chair in sara-plus with totalAx2 for safety in lift equipment. From there performed two additional sit to stands using sara plus lift, and with patient able to bear weight through B LEs and maintain standing for between 30-60 seconds before fatiguing and needing to sit down. Education provided on HEP for B LEs in bed, patient requesting UE HEP as well. He was left up in chair with all needs met and chair alarm active this afternoon.    Follow Up Recommendations  SNF     Equipment Recommendations  Wheelchair (measurements PT);Wheelchair cushion (measurements PT);Hospital bed;Other (comment)    Recommendations for Other Services       Precautions / Restrictions Precautions Precautions: Fall;Posterior Hip Precaution Booklet Issued: No Precaution Comments: h/o falls, very weak, per pt report posterior hip precautions. Restrictions Weight Bearing Restrictions: No    Mobility  Bed Mobility Overal bed mobility: Needs Assistance       Supine to sit: Mod assist;HOB elevated     General bed mobility comments: Able to get legs off of EOB, continues to require ModA to boost to full upright sitting position at EOB   Transfers Overall  transfer level: Needs assistance Equipment used: Ambulation equipment used(sara plus ) Transfers: Sit to/from Stand Sit to Stand: Total assist;+2 safety/equipment Stand pivot transfers: Total assist;+2 safety/equipment       General transfer comment: totalA transfer with +2 for safety in sara plus lift; performed 2 additional sit to stands in lift once transferred to chair   Ambulation/Gait             General Gait Details: unable    Stairs             Wheelchair Mobility    Modified Rankin (Stroke Patients Only)       Balance Overall balance assessment: Needs assistance Sitting-balance support: Feet supported;Bilateral upper extremity supported Sitting balance-[name]: Poor Sitting balance - Comments: heavily reliant on UE support Postural control: Posterior lean Standing balance support: Bilateral upper extremity supported Standing balance-[name]: Poor Standing balance comment: remains heavily reliant on UE support and sara plus to remain standing.                              Cognition Arousal/Alertness: Awake/alert Behavior During Therapy: WFL for tasks assessed/performed Overall Cognitive Status: Within Functional Limits for tasks assessed                                        Exercises      General Comments        Pertinent Vitals/Pain Pain Assessment: 0-10 Pain Score: 4  Pain Location:  R shoulder, R knee- chronic arthritic pain  Pain Descriptors / Indicators: Aching;Sore;Grimacing Pain Intervention(s): Monitored during session;Repositioned    Home Living                      Prior Function            PT Goals (current goals can now be found in the care plan section) Acute Rehab PT Goals Patient Stated Goal: get to chair PT Goal Formulation: With patient Time For Goal Achievement: 11/29/18 Potential to Achieve Goals: Fair Progress towards PT goals: Progressing toward goals     Frequency    Min 3X/week      PT Plan Current plan remains appropriate    Co-evaluation              AM-PAC PT "6 Clicks" Mobility   Outcome Measure  Help needed turning from your back to your side while in a flat bed without using bedrails?: A Lot Help needed moving from lying on your back to sitting on the side of a flat bed without using bedrails?: A Lot Help needed moving to and from a bed to a chair (including a wheelchair)?: Total Help needed standing up from a chair using your arms (e.g., wheelchair or bedside chair)?: Total Help needed to walk in hospital room?: Total Help needed climbing 3-5 steps with a railing? : Total 6 Click Score: 8    End of Session Equipment Utilized During Treatment: Gait belt Activity Tolerance: Patient tolerated treatment well Patient left: in chair;with call bell/phone within reach;with chair alarm set Nurse Communication: Need for lift equipment(use of hoyer and timing to get patient back to bed later in PM ) PT Visit Diagnosis: Other abnormalities of gait and mobility (R26.89);Muscle weakness (generalized) (M62.81);Difficulty in walking, not elsewhere classified (R26.2);History of falling (Z91.81)     Time: 3567-0141 PT Time Calculation (min) (ACUTE ONLY): 35 min  Charges:  $Therapeutic Activity: 23-37 mins                     Deniece Ree PT, DPT, CBIS  Supplemental Physical Therapist [place]    Pager [phone] Acute Rehab Office [phone]

## 2018-11-25 NOTE — Progress Notes (Signed)
PROGRESS NOTE        PATIENT DETAILS Name: [name] Age: 73 y.o. Sex: male Date of Birth: 1946-07-12 Admit Date: 10/01/2018 Admitting Physician Vianne Bulls, MD FUX:NATFTD, Hollice Espy, MD  Brief Narrative: Patient is a 73 y.o. male with history of A. 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  Found to have a soft tissue infection of the left hip area, acute kidney injury and hyperkalemia.  ID, orthopedics consulted-underwent left hip aspiration on 2/14, infectious disease recommending 6 weeks of IV Invanz. Finishing 3/26.   Subjective: Has some pain right shoulder. Had some relief with prednisone but has ongoing pain. Lying comfortably in bed.  Denies any chest pain or shortness of breath.  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  Was on suppressive Flagyl for Bacteroides fragilis infection-recommendations from ID are for 6 weeks of IV ertapenem-end date of November 25, 2018.  AKI: Likely hemodynamically mediated-secondary to soft tissue infection of the left hip, diuretic use.  Resolved with supportive care.  Follow electrolytes periodically.  Hyperkalemia: Suspect secondary to AKI with use of Aldactone and potassium supplementation.  Resolved.  History of left total hip arthroplasty with subsequent infection with Bacteroides fragilis: Patient  was evaluated by infectious disease at [facility] was previously on Flagyl.  Now on IV Invanz (as above)-Flagyl has been discontinued.  Recurrent pleural effusion: Felt to be secondary to hepatic hydrothorax-most recent CT chest on 10/07/2018 shows a small pleural effusion-patient is asymptomatic at this point.  Doubt further work-up required-furthermore he has had a recent extensive work-up including multiple thoracocentesis   Cryptogenic cirrhosis: Prior hepatitis serology done earlier this month was negative-agree with plans outlined in prior discharge summary for outpatient follow-up.  Volume status is stable-he is awake and alert-continue Aldactone  Atrial fibrillation: Rate controlled with Cardizem.Continue Eliquis.  Chronic back and right shoulder pain: Continue Lidoderm patch, Tylenol and as needed narcotics.  Bipolar disorder: Stable-continue Lexapro, Neurontin  Constipation: Probably has constipation related to chronic use of narcotics-continue MiraLAX-and lactulose-but decrease lactulose to twice daily dosing.  Right shoulder pain: Claims he has severe arthritis-and has required cortisone shots in the past-evaluated by orthopedics-supportive care for now.  Continues to complain of severe right shoulder pain-we will give a short course of oral steroids-started on 3/24-no improvement so far-he wants to try a total of 3 days-to see if any improvement-if no improvement can discontinue steroids.  OSA: Continue CPAP nightly  Morbid obesity  Acute on chronic debility/deconditioning: Has chronic debility/deconditioning at baseline-this is worsened due to acute illness/AKI/left hip infection.  Per social work-very difficult to place to SNF-patient would likely remain in the hospital for a while before placement can be arranged.  Per social work-cannot be discharged home as he has active adult protective service case  Insomnia: better with trazodone.  DVT  Prophylaxis: Full dose anticoagulation with  Eliquis  Code Status: Full code  Family Communication: No family at bedside.  Disposition Plan: Remain inpatient- SNF on discharge when bed available.  Medically stable.  Unable to transition out of the hospital.  Antimicrobial agents: Anti-infectives (From admission, onward)   Start     Dose/Rate Route Frequency Ordered Stop   10/15/18 1500  cefTRIAXone (ROCEPHIN) 2 g in sodium chloride 0.9 % 100 mL IVPB  Status:  Discontinued     2 g 200 mL/hr over 30 Minutes Intravenous Every 24 hours 10/15/18 1434 10/15/18 1447   10/15/18 1500  ertapenem (INVANZ) 1,000 mg in sodium chloride 0.9 % 100 mL IVPB     1 g 200 mL/hr over 30 Minutes Intravenous Every 24 hours 10/15/18 1447 11/25/18 2359   10/08/18 0000  doxycycline (VIBRA-TABS) 100 MG tablet     100 mg Oral Every 12 hours 10/08/18 0921     10/06/18 1000  doxycycline (VIBRA-TABS) tablet 100 mg  Status:  Discontinued     100 mg Oral Every 12 hours 10/06/18 0726 10/11/18 1328   10/03/18 1800  metroNIDAZOLE (FLAGYL) tablet 500 mg  Status:  Discontinued     500 mg Oral Every 8 hours 10/03/18 1054 10/15/18 1447   10/03/18 0700  vancomycin (VANCOCIN) 2,000 mg in sodium chloride 0.9 % 500 mL IVPB  Status:  Discontinued     2,000 mg 250 mL/hr over 120 Minutes Intravenous Every 36 hours 10/01/18 1942 10/06/18 0726   10/02/18 0200  ceFEPIme (MAXIPIME) 2 g in sodium chloride 0.9 % 100 mL IVPB  Status:  Discontinued     2 g 200 mL/hr over 30 Minutes Intravenous Every 8 hours 10/01/18 1942 10/02/18 1008   10/01/18 1830  vancomycin (VANCOCIN) 2,500 mg in sodium chloride 0.9 % 500 mL IVPB     2,500 mg 250 mL/hr over 120 Minutes Intravenous STAT 10/01/18 1825 10/02/18 0126   10/01/18 1800  ceFEPIme (MAXIPIME) 2 g in sodium chloride 0.9 % 100 mL IVPB     2 g 200 mL/hr over 30 Minutes Intravenous  Once 10/01/18 1747 10/01/18 1903   10/01/18 1800  metroNIDAZOLE (FLAGYL) IVPB 500 mg  Status:  Discontinued      500 mg 100 mL/hr over 60 Minutes Intravenous Every 8 hours 10/01/18 1747 10/03/18 1054   10/01/18 1800  vancomycin (VANCOCIN) IVPB 1000 mg/200 mL premix  Status:  Discontinued     1,000 mg 200 mL/hr over 60 Minutes Intravenous  Once 10/01/18 1747 10/01/18 1825      Procedures: None  CONSULTS:  None  Time spent: 15- minutes  MEDICATIONS: Scheduled Meds: . apixaban  5 mg Oral BID  . Chlorhexidine Gluconate Cloth  6 each Topical Daily  . cycloSPORINE  1 drop Both Eyes BID  . diclofenac sodium  2 g Topical BID  . diltiazem  120 mg Oral Daily  . escitalopram  10 mg Oral Daily  . gabapentin  100 mg Oral TID  . lactulose  30 g Oral BID  . lidocaine  1 patch Transdermal Q24H  . Melatonin  6 mg Oral QHS  . midodrine  10 mg Oral TID WC  . multivitamin with minerals  1 tablet Oral Daily  . pantoprazole  40 mg Oral Daily  . polyethylene glycol  17 g Oral BID  . predniSONE  60 mg Oral Q breakfast  . Ensure Max Protein  11 oz Oral BID  . senna-docusate  2 tablet Oral BID  . spironolactone  50 mg Oral  Daily  . thiamine  100 mg Oral Daily   Continuous Infusions: . sodium chloride 10 mL/hr at 11/21/18 1900  . ertapenem 1,000 mg (11/24/18 1324)   PRN Meds:.sodium chloride, acetaminophen, albuterol, alum & mag hydroxide-simeth, diltiazem, guaiFENesin-dextromethorphan, hydrOXYzine, menthol-cetylpyridinium, [DISCONTINUED] ondansetron **OR** ondansetron (ZOFRAN) IV, oxyCODONE, sodium chloride flush, sodium phosphate, traZODone   PHYSICAL EXAM: Vital signs: Vitals:   11/24/18 1413 11/24/18 2115 11/25/18 0531 11/25/18 0536  BP: (!) 94/59 111/72 99/81   Pulse: 94 85 66   Resp: 20 16 16    Temp: 98.1 F (36.7 C) 98.1 F (36.7 C) (!) 97.5 F (36.4 C)   TempSrc: Oral Oral Oral   SpO2: 96% 96% 100%   Weight:    122 kg  Height:       Filed Weights   11/23/18 0500 11/24/18 0447 11/25/18 0536  Weight: 118.7 kg 118.2 kg 122 kg   Body mass index is 40.9 kg/m.   General  appearance:Awake, alert, not in any distress. Sleeping with BIPAP on. Eyes:no scleral icterus. HEENT: Atraumatic and Normocephalic Neck: supple, no JVD. Resp:Good air entry bilaterally,no rales or rhonchi CVS: S1 S2 regular, no murmurs.  GI: Bowel sounds present, Non tender and not distended with no gaurding, rigidity or rebound. Extremities: B/L Lower Ext shows no edema, both legs are warm to touch Neurology:  Non focal Musculoskeletal:No digital cyanosis Skin:No Rash, warm and dry Wounds:N/A  LABORATORY DATA: CBC: Recent Labs  Lab 11/22/18 0336  WBC 6.7  HGB 11.1*  HCT 36.0*  MCV 90.9  PLT 992    Basic Metabolic Panel: Recent Labs  Lab 11/22/18 0336  NA 136  K 4.4  CL 103  CO2 25  GLUCOSE 108*  BUN 35*  CREATININE 0.98  CALCIUM 10.5*    GFR: Estimated Creatinine Clearance: 86.5 mL/min (by C-G formula based on SCr of 0.98 mg/dL).  Liver Function Tests: No results for input(s): AST, ALT, ALKPHOS, BILITOT, PROT, ALBUMIN in the last 168 hours. No results for input(s): LIPASE, AMYLASE in the last 168 hours. No results for input(s): AMMONIA in the last 168 hours.  Coagulation Profile: No results for input(s): INR, PROTIME in the last 168 hours.  Cardiac Enzymes: No results for input(s): CKTOTAL, CKMB, CKMBINDEX, TROPONINI in the last 168 hours.  BNP (last 3 results) No results for input(s): PROBNP in the last 8760 hours.  HbA1C: No results for input(s): HGBA1C in the last 72 hours.  CBG: Recent Labs  Lab 11/18/18 2056 11/19/18 0757 11/20/18 2036  GLUCAP 139* 90 113*    Lipid Profile: No results for input(s): CHOL, HDL, LDLCALC, TRIG, CHOLHDL, LDLDIRECT in the last 72 hours.  Thyroid Function Tests: No results for input(s): TSH, T4TOTAL, FREET4, T3FREE, THYROIDAB in the last 72 hours.  Anemia Panel: No results for input(s): VITAMINB12, FOLATE, FERRITIN, TIBC, IRON, RETICCTPCT in the last 72 hours.  Urine analysis:    Component Value Date/Time    COLORURINE YELLOW 10/01/2018 1822   APPEARANCEUR CLEAR 10/01/2018 1822   APPEARANCEUR Clear 01/18/2018 1539   LABSPEC 1.010 10/01/2018 1822   PHURINE 5.0 10/01/2018 1822   GLUCOSEU NEGATIVE 10/01/2018 1822   HGBUR NEGATIVE 10/01/2018 1822   BILIRUBINUR NEGATIVE 10/01/2018 1822   BILIRUBINUR Negative 01/18/2018 [place] 10/01/2018 1822   PROTEINUR NEGATIVE 10/01/2018 1822   UROBILINOGEN 0.2 09/06/2012 0213   NITRITE NEGATIVE 10/01/2018 1822   LEUKOCYTESUR NEGATIVE 10/01/2018 1822   LEUKOCYTESUR Negative 01/18/2018 1539    Sepsis Labs: Lactic Acid, Venous  Component Value Date/Time   LATICACIDVEN 1.7 10/01/2018 1833    MICROBIOLOGY: No results found for this or any previous visit (from the past 240 hour(s)).  RADIOLOGY STUDIES/RESULTS: Dg Abd Portable 1v  Result Date: 11/03/2018 CLINICAL DATA:  Constipation EXAM: PORTABLE ABDOMEN - 1 VIEW COMPARISON:  11/01/2018 FINDINGS: There is a non obstructive bowel gas pattern. No supine evidence of free air. No organomegaly or suspicious calcification. No acute bony abnormality. IMPRESSION: No acute findings. Electronically Signed   By: Rolm Baptise M.D.   On: 11/03/2018 11:32   Dg Abd Portable 1v  Result Date: 11/01/2018 CLINICAL DATA:  Constipation EXAM: PORTABLE ABDOMEN - 1 VIEW COMPARISON:  CT 01/09/2015 FINDINGS: Large stool burden throughout the colon. No evidence of bowel obstruction or free air. Prior cholecystectomy. Surgical sutures in the left upper abdomen. IMPRESSION: Large stool burden.  No bowel obstruction or free air. Electronically Signed   By: Rolm Baptise M.D.   On: 11/01/2018 11:03     LOS: 8 days   Barb Merino, MD  [facility]  If 7PM-7AM, please contact night-coverage  Please page via [email or web address]-Password [email or web address]-click on MD name and type text message  11/25/2018, 9:27 AM

## 2018-11-25 NOTE — Progress Notes (Signed)
Nutrition Follow-up   RD working remotely  [place]:   Morbid obesity  INTERVENTION:    Ensure Max po BID, each supplement provides 150 kcal and 30 grams of protein  NUTRITION DIAGNOSIS:   Increased nutrient needs related to chronic illness (cirrhosis) as evidenced by estimated needs, ongoing  GOAL:   Patient will meet greater than or equal to 90% of their needs, met  MONITOR:   PO intake, Supplement acceptance, Labs, Weight trends, Skin, I & O's  ASSESSMENT:   73 y.o. male with medical history significant for chronic back and right shoulder pain, OSA on CPAP, paroxysmal atrial fibrillation on Eliquis, hepatic cirrhosis, recurrent pleural effusions, and status post left total hip arthroplasty complicated by infection   Admit dx include: Hyperkalemia [E87.5] Cellulitis of left hip [T86.825] AKI (acute kidney injury) ([place]) [N17.9]  PO intake excellent at 100% per flowsheet records. Drinking his Ensure Max supplements BID.  Labs & medications reviewed.  Pt is medically stable however he is a difficult SNF placement.  Diet Order:   Diet Order            Diet 2 gram sodium Room service appropriate? Yes; Fluid consistency: Thin  Diet effective now        Diet - low sodium heart healthy             EDUCATION NEEDS:   Education needs have been addressed  Skin:  Skin Assessment: Skin Integrity Issues: Incisions: L hip Other: MASD bilateral on buttocks  Last BM:  3/25  Height:   Ht Readings from Last 1 Encounters:  10/01/18 5' 8"  (1.727 m)   Weight:   Wt Readings from Last 1 Encounters:  11/25/18 122 kg   Ideal Body Weight:  70 kg  BMI:  Body mass index is 40.9 kg/m.  Estimated Nutritional Needs:   Kcal:  2250-2450  Protein:  125-140 grams  Fluid:  2.2-2.4 L  [name], RD, LDN Pager #: [phone] After-Hours Pager #: [phone]

## 2018-11-26 MED ORDER — DIPHENHYDRAMINE HCL 25 MG PO CAPS
25.0000 mg | ORAL_CAPSULE | Freq: Once | ORAL | Status: AC
  Administered 2018-11-26: 25 mg via ORAL
  Filled 2018-11-26: qty 1

## 2018-11-26 MED ORDER — OXYCODONE HCL 5 MG PO TABS: 5.0000 mg | ORAL_TABLET | ORAL | 0 refills | Status: DC | PRN

## 2018-11-26 NOTE — TOC Progression Note (Signed)
Transition of Care [facility]) - Progression Note    Patient Details  Name: [name] MRN: [id number] Date of Birth: 05/13/1946  [facility] of [facility]) CM/SW [place], LCSW Phone Number: 11/26/2018, 9:34 AM  Clinical Narrative:    Per [place] [facility], they were unable to complete Medicaid application or assign a worker since patient told them he was going to receive money. CSW touched base with local SNFs again since patient has not been able to produce the letter stating that he is getting a settlement. [facility] going to see if they can get patient approved for another 100 days of SNF since Medicare is providing a waiver during this particular time of COVID crisis.    Expected Discharge Plan: [place]    Expected Discharge Plan and Services Expected Discharge Plan: Skilled Nursing Facility In-house Referral: Clinical Social Work Discharge Planning Services: CM Consult     Expected Discharge Date: 10/08/18                         Social Determinants of Health (SDOH) Interventions    Readmission Risk Interventions Readmission Risk Prevention Plan 09/11/2018  Transportation Screening Complete  Medication Review Press photographer) Complete  HRI or Home Care Consult Complete  Some recent data might be hidden

## 2018-11-26 NOTE — TOC Progression Note (Signed)
Transition of Care [facility]) - Progression Note    Patient Details  Name: [name] MRN: [id number] Date of Birth: February 24, 1946  Transition of Care [facility]) CM/SW [place], LCSW Phone Number: 11/26/2018, 3:32 PM  Clinical Narrative:    CSW was hoping that patient could have SNF days restarted with current COVID waivers but [employer] is not following the Medicare guidelines. Patient will have to either apply for Medicaid to qualify for a Letter of Guarantee bed or return home (which he does not have the proper support to do).    Expected Discharge Plan: [place]    Expected Discharge Plan and Services Expected Discharge Plan: Skilled Nursing Facility In-house Referral: Clinical Social Work Discharge Planning Services: CM Consult     Expected Discharge Date: 11/26/18                       Readmission Risk Interventions Readmission Risk Prevention Plan 09/11/2018  Transportation Screening Complete  Medication Review (RN Care Manager) Complete  HRI or Home Care Consult Complete  Some recent data might be hidden

## 2018-11-26 NOTE — Progress Notes (Signed)
PROGRESS NOTE        PATIENT DETAILS Name: [name] Age: 73 y.o. Sex: male Date of Birth: 1946-08-11 Admit Date: 10/01/2018 Admitting Physician Vianne Bulls, MD EUM:PNTIRW, Hollice Espy, MD  Brief Narrative: Patient is a 73 y.o. male with history of A. 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  Found to have a soft tissue infection of the left hip area, acute kidney injury and hyperkalemia.  ID, orthopedics consulted-underwent left hip aspiration on 2/14, infectious disease recommending 6 weeks of IV Invanz. Finished 3/26.   Subjective: Has some pain right shoulder. Had some relief with prednisone but has ongoing pain. Lying comfortably in bed.  Denies any chest pain or shortness of breath.  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  Was on suppressive Flagyl for Bacteroides fragilis infection-recommendations from ID are for 6 weeks of IV ertapenem-end date of November 25, 2018. He has finished all his antibiotics while in the hospital.  AKI: Likely hemodynamically mediated-secondary to soft tissue infection of the left hip, diuretic use.  Resolved with supportive care.  Follow electrolytes periodically.  Hyperkalemia: Suspect secondary to AKI with use of Aldactone and potassium supplementation.  Resolved.  Will recheck his BMP tomorrow.  History of  left total hip arthroplasty with subsequent infection with Bacteroides fragilis: Patient was evaluated by infectious [facility] at [facility] was previously on Flagyl.  Now on IV Invanz (as above)-Flagyl has been discontinued. He has finished all his antibiotics.  Recurrent pleural effusion: Felt to be secondary to hepatic hydrothorax-most recent CT chest on 10/07/2018 shows a small pleural effusion-patient is asymptomatic at this point.  Doubt further work-up required-furthermore he has had a recent extensive work-up including multiple thoracocentesis   Cryptogenic cirrhosis: Prior hepatitis serology done earlier this month was negative-agree with plans outlined in prior discharge summary for outpatient follow-up.  Volume status is stable-he is awake and alert-continue Aldactone  Atrial fibrillation: Rate controlled with Cardizem.Continue Eliquis.  Chronic back and right shoulder pain: Continue Lidoderm patch, Tylenol and as needed narcotics.  Bipolar disorder: Stable-continue Lexapro, Neurontin  Constipation: Probably has constipation related to chronic use of narcotics-continue MiraLAX-and lactulose-but decrease lactulose to twice daily dosing.  Right shoulder pain: Claims he has severe arthritis-and has required cortisone shots in the past-evaluated by orthopedics-supportive care for now.  Continues to complain of severe right shoulder pain-we will give a short course of oral steroids-started on 3/24-no improvement so far-he wants to try a total of 3 days-to see if any improvement-if no improvement can discontinue steroids.  OSA: Continue CPAP nightly  Morbid obesity  Acute on chronic debility/deconditioning: Has chronic debility/deconditioning at baseline-this is worsened due to acute illness/AKI/left hip infection.  Transfer to skilled nursing when bed is available.  Medically stable.  Insomnia: better with trazodone.  DVT Prophylaxis: Full dose anticoagulation with  Eliquis  Code Status:  Full code  Family Communication: No family at bedside.  Disposition Plan: Remain inpatient- SNF on discharge when bed available.  Medically stable.  Unable to transition out of the hospital.  Antimicrobial agents: Anti-infectives (From admission, onward)   Start     Dose/Rate Route Frequency Ordered Stop   10/15/18 1500  cefTRIAXone (ROCEPHIN) 2 g in sodium chloride 0.9 % 100 mL IVPB  Status:  Discontinued     2 g 200 mL/hr over 30 Minutes Intravenous Every 24 hours 10/15/18 1434 10/15/18 1447   10/15/18 1500  ertapenem (INVANZ) 1,000 mg in sodium chloride 0.9 % 100 mL IVPB     1 g 200 mL/hr over 30 Minutes Intravenous Every 24 hours 10/15/18 1447 11/25/18 1604   10/08/18 0000  doxycycline (VIBRA-TABS) 100 MG tablet  Status:  Discontinued     100 mg Oral Every 12 hours 10/08/18 0921 11/26/18    10/06/18 1000  doxycycline (VIBRA-TABS) tablet 100 mg  Status:  Discontinued     100 mg Oral Every 12 hours 10/06/18 0726 10/11/18 1328   10/03/18 1800  metroNIDAZOLE (FLAGYL) tablet 500 mg  Status:  Discontinued     500 mg Oral Every 8 hours 10/03/18 1054 10/15/18 1447   10/03/18 0700  vancomycin (VANCOCIN) 2,000 mg in sodium chloride 0.9 % 500 mL IVPB  Status:  Discontinued     2,000 mg 250 mL/hr over 120 Minutes Intravenous Every 36 hours 10/01/18 1942 10/06/18 0726   10/02/18 0200  ceFEPIme (MAXIPIME) 2 g in sodium chloride 0.9 % 100 mL IVPB  Status:  Discontinued     2 g 200 mL/hr over 30 Minutes Intravenous Every 8 hours 10/01/18 1942 10/02/18 1008   10/01/18 1830  vancomycin (VANCOCIN) 2,500 mg in sodium chloride 0.9 % 500 mL IVPB     2,500 mg 250 mL/hr over 120 Minutes Intravenous STAT 10/01/18 1825 10/02/18 0126   10/01/18 1800  ceFEPIme (MAXIPIME) 2 g in sodium chloride 0.9 % 100 mL IVPB     2 g 200 mL/hr over 30 Minutes Intravenous  Once 10/01/18 1747 10/01/18 1903   10/01/18 1800  metroNIDAZOLE (FLAGYL) IVPB 500 mg  Status:  Discontinued     500 mg 100  mL/hr over 60 Minutes Intravenous Every 8 hours 10/01/18 1747 10/03/18 1054   10/01/18 1800  vancomycin (VANCOCIN) IVPB 1000 mg/200 mL premix  Status:  Discontinued     1,000 mg 200 mL/hr over 60 Minutes Intravenous  Once 10/01/18 1747 10/01/18 1825      Procedures: None  CONSULTS:  None  Time spent: 15- minutes  MEDICATIONS: Scheduled Meds: . apixaban  5 mg Oral BID  . Chlorhexidine Gluconate Cloth  6 each Topical Daily  . cycloSPORINE  1 drop Both Eyes BID  . diclofenac sodium  2 g Topical BID  . diltiazem  120 mg Oral Daily  . escitalopram  10 mg Oral Daily  . gabapentin  100 mg Oral TID  . lactulose  30 g Oral BID  . lidocaine  1 patch Transdermal Q24H  . Melatonin  6 mg Oral QHS  . midodrine  10 mg Oral TID WC  . multivitamin with minerals  1 tablet Oral Daily  . pantoprazole  40 mg Oral Daily  . polyethylene glycol  17 g Oral BID  . predniSONE  60 mg Oral Q breakfast  . Ensure Max Protein  11 oz Oral BID  . senna-docusate  2 tablet Oral BID  . spironolactone  50 mg Oral  Daily  . thiamine  100 mg Oral Daily   Continuous Infusions: . sodium chloride 10 mL/hr at 11/21/18 1900   PRN Meds:.sodium chloride, acetaminophen, albuterol, alum & mag hydroxide-simeth, diltiazem, guaiFENesin-dextromethorphan, hydrOXYzine, menthol-cetylpyridinium, [DISCONTINUED] ondansetron **OR** ondansetron (ZOFRAN) IV, oxyCODONE, sodium chloride flush, sodium phosphate, traZODone   PHYSICAL EXAM: Vital signs: Vitals:   11/25/18 1748 11/25/18 2150 11/26/18 0513 11/26/18 0516  BP: (!) 101/59 113/71 111/73   Pulse: 92 91 91   Resp: 18 16 16    Temp: 98.1 F (36.7 C) 98.4 F (36.9 C) 97.7 F (36.5 C)   TempSrc: Oral Oral Oral   SpO2: 100% 95% 99%   Weight:    124.2 kg  Height:       Filed Weights   11/24/18 0447 11/25/18 0536 11/26/18 0516  Weight: 118.2 kg 122 kg 124.2 kg   Body mass index is 41.63 kg/m.   General appearance:Awake, alert, not in any distress. Sleeping with  BIPAP on. Eyes:no scleral icterus. HEENT: Atraumatic and Normocephalic Neck: supple, no JVD. Resp:Good air entry bilaterally,no rales or rhonchi CVS: S1 S2 regular, no murmurs.  GI: Bowel sounds present, Non tender and not distended with no gaurding, rigidity or rebound. Extremities: B/L Lower Ext shows no edema, both legs are warm to touch Neurology:  Non focal Musculoskeletal:No digital cyanosis Skin:No Rash, warm and dry Wounds:N/A  LABORATORY DATA: CBC: Recent Labs  Lab 11/22/18 0336  WBC 6.7  HGB 11.1*  HCT 36.0*  MCV 90.9  PLT 326    Basic Metabolic Panel: Recent Labs  Lab 11/22/18 0336  NA 136  K 4.4  CL 103  CO2 25  GLUCOSE 108*  BUN 35*  CREATININE 0.98  CALCIUM 10.5*    GFR: Estimated Creatinine Clearance: 87.4 mL/min (by C-G formula based on SCr of 0.98 mg/dL).  Liver Function Tests: No results for input(s): AST, ALT, ALKPHOS, BILITOT, PROT, ALBUMIN in the last 168 hours. No results for input(s): LIPASE, AMYLASE in the last 168 hours. No results for input(s): AMMONIA in the last 168 hours.  Coagulation Profile: No results for input(s): INR, PROTIME in the last 168 hours.  Cardiac Enzymes: No results for input(s): CKTOTAL, CKMB, CKMBINDEX, TROPONINI in the last 168 hours.  BNP (last 3 results) No results for input(s): PROBNP in the last 8760 hours.  HbA1C: No results for input(s): HGBA1C in the last 72 hours.  CBG: Recent Labs  Lab 11/20/18 2036  GLUCAP 113*    Lipid Profile: No results for input(s): CHOL, HDL, LDLCALC, TRIG, CHOLHDL, LDLDIRECT in the last 72 hours.  Thyroid Function Tests: No results for input(s): TSH, T4TOTAL, FREET4, T3FREE, THYROIDAB in the last 72 hours.  Anemia Panel: No results for input(s): VITAMINB12, FOLATE, FERRITIN, TIBC, IRON, RETICCTPCT in the last 72 hours.  Urine analysis:    Component Value Date/Time   COLORURINE YELLOW 10/01/2018 1822   APPEARANCEUR CLEAR 10/01/2018 1822   APPEARANCEUR Clear  01/18/2018 1539   LABSPEC 1.010 10/01/2018 1822   PHURINE 5.0 10/01/2018 1822   GLUCOSEU NEGATIVE 10/01/2018 1822   HGBUR NEGATIVE 10/01/2018 1822   BILIRUBINUR NEGATIVE 10/01/2018 1822   BILIRUBINUR Negative 01/18/2018 1539   KETONESUR NEGATIVE 10/01/2018 1822   PROTEINUR NEGATIVE 10/01/2018 1822   UROBILINOGEN 0.2 09/06/2012 0213   NITRITE NEGATIVE 10/01/2018 1822   LEUKOCYTESUR NEGATIVE 10/01/2018 1822   LEUKOCYTESUR Negative 01/18/2018 1539    Sepsis Labs: Lactic Acid, Venous    Component Value Date/Time   LATICACIDVEN 1.7 10/01/2018 1833    MICROBIOLOGY:  No results found for this or any previous visit (from the past 240 hour(s)).  RADIOLOGY STUDIES/RESULTS: Dg Abd Portable 1v  Result Date: 11/03/2018 CLINICAL DATA:  Constipation EXAM: PORTABLE ABDOMEN - 1 VIEW COMPARISON:  11/01/2018 FINDINGS: There is a non obstructive bowel gas pattern. No supine evidence of free air. No organomegaly or suspicious calcification. No acute bony abnormality. IMPRESSION: No acute findings. Electronically Signed   By: Rolm Baptise M.D.   On: 11/03/2018 11:32   Dg Abd Portable 1v  Result Date: 11/01/2018 CLINICAL DATA:  Constipation EXAM: PORTABLE ABDOMEN - 1 VIEW COMPARISON:  CT 01/09/2015 FINDINGS: Large stool burden throughout the colon. No evidence of bowel obstruction or free air. Prior cholecystectomy. Surgical sutures in the left upper abdomen. IMPRESSION: Large stool burden.  No bowel obstruction or free air. Electronically Signed   By: Rolm Baptise M.D.   On: 11/01/2018 11:03     LOS: 42 days   Barb Merino, MD  [facility]  If 7PM-7AM, please contact night-coverage  Please page via [email or web address]-Password [email or web address]-click on MD name and type text message  11/26/2018, 10:29 AM

## 2018-11-26 NOTE — Progress Notes (Signed)
Pt has a home CPAP machine.  Pt placing self on/off machine.  Tolerating well- no issues noted.

## 2018-11-26 NOTE — Progress Notes (Signed)
Physical Therapy Treatment Patient Details Name: [name] MRN: [id number] DOB: 1946-02-09 Today's Date: 11/26/2018    History of Present Illness 73 y.o. male admitted 10/01/18 with generalized weakness, falls and SOB. Workup revealed afib with recurrent R-side pleural effusion; AKI. Pt with L hip cellulitis with chronic L hip prosthetic joint infection; s/p joint aspiration 2/14. PMH includes HTN, afib on Eliquis, OSA, bipolar disorder, obesity, L THA with infection and multiple prolonged hospitalizations.    PT Comments    Pt progressed to standing with RW and pivoting to recliner chair.  He remains fearful of falling but requires encouragement to move past use of lift equipment.  Pt would benefit from close chair follow gait training to continue to improve function.  At this time SNF placement remains appropriate based on presentation.      Follow Up Recommendations  SNF     Equipment Recommendations  Wheelchair (measurements PT);Wheelchair cushion (measurements PT);Hospital bed;Other (comment)    Recommendations for Other Services       Precautions / Restrictions Precautions Precautions: Fall;Posterior Hip Precaution Booklet Issued: No Precaution Comments: h/o falls, very weak, per pt report posterior hip precautions. Restrictions Weight Bearing Restrictions: No    Mobility  Bed Mobility Overal bed mobility: Needs Assistance Bed Mobility: Supine to Sit     Supine to sit: Mod assist;HOB elevated     General bed mobility comments: Able to get legs off of EOB, continues to require ModA to boost to full upright sitting position at EOB   Transfers Overall transfer level: Needs assistance Equipment used: Rolling walker (2 wheeled) Transfers: Sit to/from Stand Sit to Stand: Mod assist;+2 physical assistance;From elevated surface Stand pivot transfers: Mod assist;+2 physical assistance       General transfer comment: Bed elevated to improve ease.  Pt performed  standing trial with flexed posture, became fearful of falling and returned to seated.  On second attempt he was able to stand and pivot to recliner with mod +2.    Ambulation/Gait Ambulation/Gait assistance: (NT- Pt should be able to progress next tx if he isnt too anxious.  )               Stairs             Wheelchair Mobility    Modified Rankin (Stroke Patients Only)       Balance Overall balance assessment: Needs assistance Sitting-balance support: Feet supported;Bilateral upper extremity supported Sitting balance-[name]: Fair       Standing balance-[name]: Poor                              Cognition Arousal/Alertness: Awake/alert Behavior During Therapy: WFL for tasks assessed/performed Overall Cognitive Status: Within Functional Limits for tasks assessed                                 General Comments: fearful of moving on the bed      Exercises      General Comments        Pertinent Vitals/Pain Pain Assessment: 0-10 Faces Pain Scale: Hurts even more Pain Location: R shoulder, R knee- chronic arthritic pain  Pain Descriptors / Indicators: Aching;Sore;Grimacing Pain Intervention(s): Monitored during session;Repositioned    Home Living                      Prior Function  PT Goals (current goals can now be found in the care plan section) Acute Rehab PT Goals Patient Stated Goal: get to chair Potential to Achieve Goals: Fair Progress towards PT goals: Progressing toward goals    Frequency    Min 3X/week      PT Plan Current plan remains appropriate    Co-evaluation              AM-PAC PT "6 Clicks" Mobility   Outcome Measure  Help needed turning from your back to your side while in a flat bed without using bedrails?: A Lot Help needed moving from lying on your back to sitting on the side of a flat bed without using bedrails?: A Lot Help needed moving to and from a  bed to a chair (including a wheelchair)?: Total Help needed standing up from a chair using your arms (e.g., wheelchair or bedside chair)?: Total Help needed to walk in hospital room?: Total Help needed climbing 3-5 steps with a railing? : Total 6 Click Score: 8    End of Session Equipment Utilized During Treatment: Gait belt Activity Tolerance: Patient tolerated treatment well Patient left: in chair;with call bell/phone within reach;with chair alarm set Nurse Communication: Need for lift equipment(hoyer for nurse to transfer back to bed shooting for up in chair to 1.5 hours. ) PT Visit Diagnosis: Other abnormalities of gait and mobility (R26.89);Muscle weakness (generalized) (M62.81);Difficulty in walking, not elsewhere classified (R26.2);History of falling (Z91.81)     Time: 0370-9643 PT Time Calculation (min) (ACUTE ONLY): 18 min  Charges:  $Therapeutic Activity: 8-22 mins                     Governor Rooks, PTA Acute Rehabilitation Services Pager [phone] Office [phone]     [name] 11/26/2018, 4:50 PM

## 2018-11-26 NOTE — Plan of Care (Signed)
  Problem: Education: Goal: Knowledge of General Education information will improve Description Including pain rating scale, medication(s)/side effects and non-pharmacologic comfort measures Outcome: Progressing   Problem: Health Behavior/Discharge Planning: Goal: Ability to manage health-related needs will improve Outcome: Progressing   Problem: Clinical Measurements: Goal: Ability to maintain clinical measurements within normal limits will improve Outcome: Progressing Goal: Will remain free from infection Outcome: Progressing Goal: Diagnostic test results will improve Outcome: Progressing Goal: Respiratory complications will improve Outcome: Progressing Goal: Cardiovascular complication will be avoided Outcome: Progressing   Problem: Activity: Goal: Risk for activity intolerance will decrease Outcome: Progressing   Problem: Coping: Goal: Level of anxiety will decrease Outcome: Progressing   Problem: Elimination: Goal: Will not experience complications related to bowel motility Outcome: Progressing Goal: Will not experience complications related to urinary retention Outcome: Progressing   Problem: Pain Managment: Goal: General experience of comfort will improve Outcome: Progressing   Problem: Safety: Goal: Ability to remain free from injury will improve Outcome: Progressing   Problem: Skin Integrity: Goal: Risk for impaired skin integrity will decrease Outcome: Progressing   Problem: Fluid Volume: Goal: Fluid volume balance will be maintained or improved Outcome: Progressing   Problem: Self-Concept: Goal: Body image disturbance will be avoided or minimized Outcome: Progressing   Problem: Urinary Elimination: Goal: Progression of disease will be identified and treated Outcome: Progressing   Problem: Education: Goal: Knowledge of General Education information will improve Description Including pain rating scale, medication(s)/side effects and  non-pharmacologic comfort measures Outcome: Progressing   Problem: Health Behavior/Discharge Planning: Goal: Ability to manage health-related needs will improve Outcome: Progressing   Problem: Clinical Measurements: Goal: Ability to maintain clinical measurements within normal limits will improve Outcome: Progressing Goal: Will remain free from infection Outcome: Progressing Goal: Diagnostic test results will improve Outcome: Progressing Goal: Respiratory complications will improve Outcome: Progressing Goal: Cardiovascular complication will be avoided Outcome: Progressing   Problem: Activity: Goal: Risk for activity intolerance will decrease Outcome: Progressing   Problem: Nutrition: Goal: Adequate nutrition will be maintained Outcome: Progressing   Problem: Coping: Goal: Level of anxiety will decrease Outcome: Progressing   Problem: Elimination: Goal: Will not experience complications related to bowel motility Outcome: Progressing Goal: Will not experience complications related to urinary retention Outcome: Progressing   Problem: Pain Managment: Goal: General experience of comfort will improve Outcome: Progressing   Problem: Safety: Goal: Ability to remain free from injury will improve Outcome: Progressing   Problem: Skin Integrity: Goal: Risk for impaired skin integrity will decrease Outcome: Progressing

## 2018-11-26 NOTE — NC FL2 (Signed)
[place] LEVEL OF CARE SCREENING TOOL     IDENTIFICATION  Patient Name: [name] Birthdate: Oct 22, 1945 Sex: male Admission Date (Current Location): 10/01/2018  [facility] and [place] Number:  [employer] and Address:  [facility], [place] [address] [place],  [address]      Provider Number: [id number]  Attending Physician Name and Address:  Barb Merino, MD  Relative Name and Phone Number:  [name] daughter, [phone]    Current Level of Care: Hospital Recommended Level of Care: [place] Prior Approval Number:    Date Approved/Denied:   PASRR Number: [id number]  Discharge Plan: SNF    Current Diagnoses: Patient Active Problem List   Diagnosis Date Noted  . Goals of care, counseling/discussion   . Palliative care encounter   . Hyperkalemia 10/01/2018  . Other cirrhosis of liver ([place]) 10/01/2018  . Chronic back pain 10/01/2018  . Cellulitis of left hip   . 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 Physical debility 09/04/2018  . Pleural effusion, bilateral   . Hypertensive urgency 08/17/2018  . Pleural effusion on left 08/17/2018  . Chronic diastolic CHF (congestive heart failure) ([place]) 08/17/2018  . OSA on CPAP 08/17/2018  . Unspecified atrial fibrillation ([place])   . Delirium   . Depression   . OSA (obstructive sleep apnea)   . Obesity, Class III, BMI 40-49.9 (morbid obesity) ([place])   . Atrial fibrillation with RVR ([place]) 04/16/2018  . Morbid obesity with BMI of 50.0-59.9, adult ([place]) 04/16/2018  . AKI (acute kidney injury) ([place]) 04/16/2018  . Visual hallucination 04/16/2018  . SIRS (systemic inflammatory response syndrome) ([place]) 04/16/2018  . Anemia of chronic disease 04/16/2018  . Left hip postoperative wound infection 03/15/2018  . Prosthetic joint infection of left hip ([place]) 03/15/2018  . Femur fracture, left ([place]) 02/15/2018  . Anemia 02/15/2018  . Femur fracture ([place])  02/15/2018  . 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 SOB (shortness of breath) 03/31/2014  . Hypertension   . Hyponatremia 03/18/2012  . 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[place]) 03/17/2012    Orientation RESPIRATION BLADDER Height & Weight     Self, Time, Situation, Place  Normal, Other (Comment)(Has home cpap) Continent Weight: 273 lb 13 oz (124.2 kg) Height:  5\' 8"  (172.7 cm)  BEHAVIORAL SYMPTOMS/MOOD NEUROLOGICAL BOWEL NUTRITION STATUS      Incontinent Diet(Please see DC Summary)  AMBULATORY STATUS COMMUNICATION OF NEEDS Skin   Extensive Assist Verbally Surgical wounds(Closed incision on hip)                       Personal Care Assistance Level of Assistance  Bathing, Feeding, Dressing Bathing Assistance: Maximum assistance Feeding assistance: Independent Dressing Assistance: Limited assistance     Functional Limitations Info  Sight, Hearing, Speech Sight Info: Impaired Hearing Info: Impaired Speech Info: Adequate    SPECIAL CARE FACTORS FREQUENCY  PT (By licensed PT), OT (By licensed OT)     PT Frequency: 5x/week OT Frequency: 3x/week            Contractures Contractures Info: Not present    Additional Factors Info  Code Status, Allergies Code Status Info: Full Allergies Info: Bee Venom, Nickel           Current Medications (11/26/2018):  This is the current hospital active medication list Current Facility-Administered Medications  Medication Dose Route Frequency Provider Last Rate Last Dose  . 0.9 %  sodium chloride  infusion   Intravenous PRN Aline August, MD 10 mL/hr at 11/21/18 1900    . acetaminophen (TYLENOL) tablet 650 mg  650 mg Oral Q6H PRN Lala Lund K, MD      . albuterol (PROVENTIL) (2.5 MG/3ML) 0.083% nebulizer solution 2.5 mg  2.5 mg Inhalation Q6H PRN Opyd, Ilene Qua, MD      . alum & mag hydroxide-simeth (MAALOX/MYLANTA) 200-200-20 MG/5ML suspension 30 mL  30 mL Oral Q6H PRN Thurnell Lose, MD   30 mL at  11/25/18 2342  . apixaban (ELIQUIS) tablet 5 mg  5 mg Oral BID Lore, Melissa A, RPH   5 mg at 11/26/18 0947  . Chlorhexidine Gluconate Cloth 2 % PADS 6 each  6 each Topical Daily Jonetta Osgood, MD   6 each at 11/26/18 [phone]  . cycloSPORINE (RESTASIS) 0.05 % ophthalmic emulsion 1 drop  1 drop Both Eyes BID Opyd, Ilene Qua, MD   1 drop at 11/26/18 0949  . diclofenac sodium (VOLTAREN) 1 % transdermal gel 2 g  2 g Topical BID Thurnell Lose, MD   2 g at 11/26/18 0950  . diltiazem (CARDIZEM CD) 24 hr capsule 120 mg  120 mg Oral Daily Thurnell Lose, MD   120 mg at 11/26/18 0949  . diltiazem (CARDIZEM) injection 10 mg  10 mg Intravenous Q6H PRN Thurnell Lose, MD      . escitalopram (LEXAPRO) tablet 10 mg  10 mg Oral Daily Opyd, Ilene Qua, MD   10 mg at 11/26/18 0948  . gabapentin (NEURONTIN) capsule 100 mg  100 mg Oral TID Vianne Bulls, MD   100 mg at 11/26/18 0949  . guaiFENesin-dextromethorphan (ROBITUSSIN DM) 100-10 MG/5ML syrup 5 mL  5 mL Oral Q4H PRN Jonetta Osgood, MD   5 mL at 11/23/18 2157  . hydrOXYzine (ATARAX/VISTARIL) tablet 25 mg  25 mg Oral Daily PRN Gardiner Barefoot, NP   25 mg at 11/25/18 1534  . lactulose (CHRONULAC) 10 GM/15ML solution 30 g  30 g Oral BID Jonetta Osgood, MD   30 g at 11/24/18 1023  . lidocaine (LIDODERM) 5 % 1 patch  1 patch Transdermal Q24H Opyd, Ilene Qua, MD   1 patch at 11/26/18 0949  . Melatonin TABS 6 mg  6 mg Oral QHS Opyd, Ilene Qua, MD   6 mg at 11/25/18 2140  . menthol-cetylpyridinium (CEPACOL) lozenge 3 mg  1 lozenge Oral PRN Thurnell Lose, MD   3 mg at 11/20/18 1743  . midodrine (PROAMATINE) tablet 10 mg  10 mg Oral TID WC Thurnell Lose, MD   10 mg at 11/26/18 0947  . multivitamin with minerals tablet 1 tablet  1 tablet Oral Daily Jonetta Osgood, MD   1 tablet at 11/26/18 [phone]  . ondansetron (ZOFRAN) injection 4 mg  4 mg Intravenous Q6H PRN Opyd, Ilene Qua, MD   4 mg at 11/24/18 1352  . oxyCODONE (Oxy IR/ROXICODONE)  immediate release tablet 5 mg  5 mg Oral Q4H PRN Jonetta Osgood, MD   5 mg at 11/26/18 0232  . pantoprazole (PROTONIX) EC tablet 40 mg  40 mg Oral Daily Opyd, Ilene Qua, MD   40 mg at 11/26/18 0948  . polyethylene glycol (MIRALAX / GLYCOLAX) packet 17 g  17 g Oral BID Aline August, MD   17 g at 11/25/18 2140  . predniSONE (DELTASONE) tablet 60 mg  60 mg Oral Q breakfast Barb Merino, MD   60  mg at 11/26/18 0948  . protein supplement (ENSURE MAX) liquid  11 oz Oral BID Aline August, MD   11 oz at 11/25/18 2145  . senna-docusate (Senokot-S) tablet 2 tablet  2 tablet Oral BID Aline August, MD   2 tablet at 11/25/18 2140  . sodium chloride flush (NS) 0.9 % injection 10-40 mL  10-40 mL Intracatheter PRN Aline August, MD   10 mL at 11/11/18 0501  . sodium phosphate (FLEET) 7-19 GM/118ML enema 1 enema  1 enema Rectal Daily PRN Aline August, MD      . spironolactone (ALDACTONE) tablet 50 mg  50 mg Oral Daily Starla Link, Kshitiz, MD   50 mg at 11/26/18 0948  . thiamine (VITAMIN B-1) tablet 100 mg  100 mg Oral Daily Opyd, Ilene Qua, MD   100 mg at 11/26/18 0948  . traZODone (DESYREL) tablet 50 mg  50 mg Oral QHS PRN Jonetta Osgood, MD   50 mg at 11/25/18 2139     Discharge Medications: Please see discharge summary for a list of discharge medications.  Relevant Imaging Results:  Relevant Lab Results:   Additional Information SS# [id number].  Patient uses home CPAP   Benard Halsted, LCSW

## 2018-11-27 LAB — BASIC METABOLIC PANEL
Anion gap: 7 (ref 5–15)
BUN: 48 mg/dL — ABNORMAL HIGH (ref 8–23)
CO2: 24 mmol/L (ref 22–32)
Calcium: 9.5 mg/dL (ref 8.9–10.3)
Chloride: 106 mmol/L (ref 98–111)
Creatinine, Ser: 0.96 mg/dL (ref 0.61–1.24)
GFR calc Af Amer: >60 mL/min (ref >60)
GFR calc non Af Amer: >60 mL/min (ref >60)
Glucose, Bld: 113 mg/dL — ABNORMAL HIGH (ref 70–99)
Potassium: 4.9 mmol/L (ref 3.5–5.1)
Sodium: 137 mmol/L (ref 135–145)

## 2018-11-27 MED ORDER — DIPHENHYDRAMINE HCL 25 MG PO CAPS
25.0000 mg | ORAL_CAPSULE | Freq: Once | ORAL | Status: AC
  Administered 2018-11-27: 25 mg via ORAL
  Filled 2018-11-27: qty 1

## 2018-11-27 NOTE — Progress Notes (Signed)
Physical Therapy Treatment Patient Details Name: [name] MRN: [id number] DOB: Nov 13, 1945 Today's Date: 11/27/2018    History of Present Illness 73 y.o. male admitted 10/01/18 with generalized weakness, falls and SOB. Workup revealed afib with recurrent R-side pleural effusion; AKI. Pt with L hip cellulitis with chronic L hip prosthetic joint infection; s/p joint aspiration 2/14. PMH includes HTN, afib on Eliquis, OSA, bipolar disorder, obesity, L THA with infection and multiple prolonged hospitalizations.    PT Comments    Pt with slow progression towards goals. Limited secondary to increased anxiety this session. Performed sit<>stand transfers X2 with mod to max A +2. Pt unable to complete stand pivot transfer. Feel SNF is best option for d/c given current deficits, however, per notes, may not be eligible. Will continue to follow acutely to maximize functional mobility independence and safety.    Follow Up Recommendations  SNF     Equipment Recommendations  Wheelchair (measurements PT);Wheelchair cushion (measurements PT);Hospital bed    Recommendations for Other Services       Precautions / Restrictions Precautions Precautions: Fall;Posterior Hip Precaution Booklet Issued: No Precaution Comments: h/o falls, very weak, per pt report posterior hip precautions. Restrictions Weight Bearing Restrictions: No    Mobility  Bed Mobility Overal bed mobility: Needs Assistance Bed Mobility: Sit to Supine;Supine to Sit     Supine to sit: Mod assist;HOB elevated Sit to supine: Mod assist;+2 for physical assistance   General bed mobility comments: Mod A for trunk assist. Pt able to move LEs off EOB.   Transfers Overall transfer level: Needs assistance Equipment used: Rolling walker (2 wheeled) Transfers: Sit to/from Stand Sit to Stand: Mod assist;Max assist;+2 physical assistance;From elevated surface         General transfer comment: Attempted standing X2. On first attempt  required mod A +2 to stand. Attempted to begin transfer to chair, however, pt becoming very fearful and returned to sitting. Required extended rest and cues for pursed lip breathing to calm anxiety. Performed sit to stand transfer second time and required max A +2 for lift assist. Only able to tolerate brief period in standing secondary to anxiety. Further mobility deferred.   Ambulation/Gait                 Stairs             Wheelchair Mobility    Modified Rankin (Stroke Patients Only)       Balance Overall balance assessment: Needs assistance Sitting-balance support: Feet supported;Bilateral upper extremity supported Sitting balance-[name]: Poor Sitting balance - Comments: Reliant on UE assist   Standing balance support: Bilateral upper extremity supported Standing balance-[name]: Poor Standing balance comment: Reliant on UE support                             Cognition Arousal/Alertness: Awake/alert Behavior During Therapy: Anxious Overall Cognitive Status: Within Functional Limits for tasks assessed                                 General Comments: very fearful of falling       Exercises General Exercises - Lower Extremity Ankle Circles/Pumps: AROM;Both;10 reps;Supine Heel Slides: AROM;Both;5 reps;Supine    General Comments        Pertinent Vitals/Pain Pain Assessment: Faces Faces Pain Scale: Hurts little more Pain Location: R shoulder, R knee- chronic arthritic pain  Pain Descriptors /  Indicators: Aching;Sore;Grimacing Pain Intervention(s): Limited activity within patient's tolerance;Monitored during session;Repositioned    Home Living                      Prior Function            PT Goals (current goals can now be found in the care plan section) Acute Rehab PT Goals Patient Stated Goal: to be able to go home to his dog PT Goal Formulation: With patient Time For Goal Achievement:  11/27/18 Potential to Achieve Goals: Fair Progress towards PT goals: Progressing toward goals    Frequency    Min 3X/week      PT Plan Current plan remains appropriate    Co-evaluation              AM-PAC PT "6 Clicks" Mobility   Outcome Measure  Help needed turning from your back to your side while in a flat bed without using bedrails?: A Lot Help needed moving from lying on your back to sitting on the side of a flat bed without using bedrails?: A Lot Help needed moving to and from a bed to a chair (including a wheelchair)?: Total Help needed standing up from a chair using your arms (e.g., wheelchair or bedside chair)?: Total Help needed to walk in hospital room?: Total Help needed climbing 3-5 steps with a railing? : Total 6 Click Score: 8    End of Session Equipment Utilized During Treatment: Gait belt Activity Tolerance: Patient tolerated treatment well Patient left: in bed;with call bell/phone within reach;with bed alarm set Nurse Communication: Mobility status PT Visit Diagnosis: Other abnormalities of gait and mobility (R26.89);Muscle weakness (generalized) (M62.81);Difficulty in walking, not elsewhere classified (R26.2);History of falling (Z91.81)     Time: 3009-2330 PT Time Calculation (min) (ACUTE ONLY): 23 min  Charges:  $Therapeutic Activity: 23-37 mins                     [name], PT, DPT  Acute Rehabilitation Services  Pager: [phone] Office: [phone]    [name] 11/27/2018, 3:24 PM

## 2018-11-27 NOTE — Progress Notes (Signed)
PROGRESS NOTE        PATIENT DETAILS Name: [name] Age: 73 y.o. Sex: male Date of Birth: 05-08-1946 Admit Date: 10/01/2018 Admitting Physician Vianne Bulls, MD PYP:[id number], Hollice Espy, MD  Brief Narrative: Patient is a 73 y.o. male with history of A. 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  Found to have a soft tissue infection of the left hip area, acute kidney injury and hyperkalemia.  ID, orthopedics consulted-underwent left hip aspiration on 2/14, infectious disease recommending 6 weeks of IV Invanz. Finished 3/26.   Subjective: Has some pain right shoulder. Had some relief with prednisone but has ongoing pain. Lying comfortably in bed.  Denies any chest pain or shortness of breath.  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  Was on suppressive Flagyl for Bacteroides fragilis infection-recommendations from ID are for 6 weeks of IV ertapenem-end date of November 25, 2018. He has finished all his antibiotics while in the hospital.  AKI: Likely hemodynamically mediated-secondary to soft tissue infection of the left hip, diuretic use.  Resolved with supportive care.  Improved and normalized.  Hyperkalemia: Suspect secondary to AKI with use of Aldactone and potassium supplementation.  Resolved.    History of left total hip arthroplasty with  subsequent infection with Bacteroides fragilis: Patient was evaluated by infectious disease at [facility] was previously on Flagyl.  Now on IV Invanz (as above)-Flagyl has been discontinued. He has finished all his antibiotics.  Recurrent pleural effusion: Felt to be secondary to hepatic hydrothorax-most recent CT chest on 10/07/2018 shows a small pleural effusion-patient is asymptomatic at this point.  Doubt further work-up required-furthermore he has had a recent extensive work-up including multiple thoracocentesis   Cryptogenic cirrhosis: Prior hepatitis serology done earlier this month was negative-agree with plans outlined in prior discharge summary for outpatient follow-up.  Volume status is stable-he is awake and alert-continue Aldactone  Atrial fibrillation: Rate controlled with Cardizem.Continue Eliquis.  Chronic back and right shoulder pain: Continue Lidoderm patch, Tylenol and as needed narcotics.  Bipolar disorder: Stable-continue Lexapro, Neurontin  Constipation: Probably has constipation related to chronic use of narcotics-continue MiraLAX-and lactulose-but decrease lactulose to twice daily dosing.  Right shoulder pain: Claims he has severe arthritis-and has required cortisone shots in the past-evaluated by orthopedics-supportive care for now.  Continues to complain of severe right shoulder pain-we will give a short course of oral steroids-started on 3/24-no improvement so far-he wants to try a total of 3 days-to see if any improvement-if no improvement can discontinue steroids.  OSA: Continue CPAP nightly  Morbid obesity  Acute on chronic debility/deconditioning: Has chronic debility/deconditioning at baseline-this is worsened due to acute illness/AKI/left hip infection.  Transfer to skilled nursing when bed is available.  Medically stable.  Insomnia: better with trazodone.  DVT Prophylaxis: Full dose anticoagulation with Eliquis  Code Status: Full  code  Family  Communication: No family at bedside.  Disposition Plan: Remain inpatient- SNF on discharge when bed available.  Medically stable.  Unable to transition out of the hospital.  Antimicrobial agents: Anti-infectives (From admission, onward)   Start     Dose/Rate Route Frequency Ordered Stop   10/15/18 1500  cefTRIAXone (ROCEPHIN) 2 g in sodium chloride 0.9 % 100 mL IVPB  Status:  Discontinued     2 g 200 mL/hr over 30 Minutes Intravenous Every 24 hours 10/15/18 1434 10/15/18 1447   10/15/18 1500  ertapenem (INVANZ) 1,000 mg in sodium chloride 0.9 % 100 mL IVPB     1 g 200 mL/hr over 30 Minutes Intravenous Every 24 hours 10/15/18 1447 11/25/18 1604   10/08/18 0000  doxycycline (VIBRA-TABS) 100 MG tablet  Status:  Discontinued     100 mg Oral Every 12 hours 10/08/18 0921 11/26/18    10/06/18 1000  doxycycline (VIBRA-TABS) tablet 100 mg  Status:  Discontinued     100 mg Oral Every 12 hours 10/06/18 0726 10/11/18 1328   10/03/18 1800  metroNIDAZOLE (FLAGYL) tablet 500 mg  Status:  Discontinued     500 mg Oral Every 8 hours 10/03/18 1054 10/15/18 1447   10/03/18 0700  vancomycin (VANCOCIN) 2,000 mg in sodium chloride 0.9 % 500 mL IVPB  Status:  Discontinued     2,000 mg 250 mL/hr over 120 Minutes Intravenous Every 36 hours 10/01/18 1942 10/06/18 0726   10/02/18 0200  ceFEPIme (MAXIPIME) 2 g in sodium chloride 0.9 % 100 mL IVPB  Status:  Discontinued     2 g 200 mL/hr over 30 Minutes Intravenous Every 8 hours 10/01/18 1942 10/02/18 1008   10/01/18 1830  vancomycin (VANCOCIN) 2,500 mg in sodium chloride 0.9 % 500 mL IVPB     2,500 mg 250 mL/hr over 120 Minutes Intravenous STAT 10/01/18 1825 10/02/18 0126   10/01/18 1800  ceFEPIme (MAXIPIME) 2 g in sodium chloride 0.9 % 100 mL IVPB     2 g 200 mL/hr over 30 Minutes Intravenous  Once 10/01/18 1747 10/01/18 1903   10/01/18 1800  metroNIDAZOLE (FLAGYL) IVPB 500 mg  Status:  Discontinued     500 mg 100 mL/hr over 60 Minutes  Intravenous Every 8 hours 10/01/18 1747 10/03/18 1054   10/01/18 1800  vancomycin (VANCOCIN) IVPB 1000 mg/200 mL premix  Status:  Discontinued     1,000 mg 200 mL/hr over 60 Minutes Intravenous  Once 10/01/18 1747 10/01/18 1825      Procedures: None  CONSULTS:  None  Time spent: 15- minutes  MEDICATIONS: Scheduled Meds: . apixaban  5 mg Oral BID  . Chlorhexidine Gluconate Cloth  6 each Topical Daily  . cycloSPORINE  1 drop Both Eyes BID  . diclofenac sodium  2 g Topical BID  . diltiazem  120 mg Oral Daily  . escitalopram  10 mg Oral Daily  . gabapentin  100 mg Oral TID  . lactulose  30 g Oral BID  . lidocaine  1 patch Transdermal Q24H  . Melatonin  6 mg Oral QHS  . midodrine  10 mg Oral TID WC  . multivitamin with minerals  1 tablet Oral Daily  . pantoprazole  40 mg Oral Daily  . polyethylene glycol  17 g Oral BID  . Ensure Max Protein  11 oz Oral BID  . senna-docusate  2 tablet Oral BID  . spironolactone  50 mg Oral Daily  . thiamine  100 mg Oral Daily   Continuous Infusions: .  sodium chloride 10 mL/hr at 11/21/18 1900   PRN Meds:.sodium chloride, acetaminophen, albuterol, alum & mag hydroxide-simeth, diltiazem, guaiFENesin-dextromethorphan, hydrOXYzine, menthol-cetylpyridinium, [DISCONTINUED] ondansetron **OR** ondansetron (ZOFRAN) IV, oxyCODONE, sodium chloride flush, sodium phosphate, traZODone   PHYSICAL EXAM: Vital signs: Vitals:   11/26/18 0516 11/26/18 1503 11/26/18 2210 11/27/18 0354  BP:  99/75 114/75 122/66  Pulse:  96 (!) 103 61  Resp:  16 18 18   Temp:  97.7 F (36.5 C) 98.4 F (36.9 C) 97.7 F (36.5 C)  TempSrc:  Oral Oral Oral  SpO2:  96% 95% 98%  Weight: 124.2 kg     Height:       Filed Weights   11/24/18 0447 11/25/18 0536 11/26/18 0516  Weight: 118.2 kg 122 kg 124.2 kg   Body mass index is 41.63 kg/m.   General appearance:Awake, alert, not in any distress. Sleeping with CPAP on. Eyes:no scleral icterus. HEENT: Atraumatic and  Normocephalic Neck: supple, no JVD. Resp:Good air entry bilaterally,no rales or rhonchi CVS: S1 S2 regular, no murmurs.  GI: Bowel sounds present, Non tender and not distended with no gaurding, rigidity or rebound. Extremities: B/L Lower Ext shows no edema, both legs are warm to touch Neurology:  Non focal Musculoskeletal:No digital cyanosis Skin:No Rash, warm and dry Wounds:N/A  LABORATORY DATA: CBC: Recent Labs  Lab 11/22/18 0336  WBC 6.7  HGB 11.1*  HCT 36.0*  MCV 90.9  PLT 503    Basic Metabolic Panel: Recent Labs  Lab 11/22/18 0336 11/27/18 0348  NA 136 137  K 4.4 4.9  CL 103 106  CO2 25 24  GLUCOSE 108* 113*  BUN 35* 48*  CREATININE 0.98 0.96  CALCIUM 10.5* 9.5    GFR: Estimated Creatinine Clearance: 89.2 mL/min (by C-G formula based on SCr of 0.96 mg/dL).  Liver Function Tests: No results for input(s): AST, ALT, ALKPHOS, BILITOT, PROT, ALBUMIN in the last 168 hours. No results for input(s): LIPASE, AMYLASE in the last 168 hours. No results for input(s): AMMONIA in the last 168 hours.  Coagulation Profile: No results for input(s): INR, PROTIME in the last 168 hours.  Cardiac Enzymes: No results for input(s): CKTOTAL, CKMB, CKMBINDEX, TROPONINI in the last 168 hours.  BNP (last 3 results) No results for input(s): PROBNP in the last 8760 hours.  HbA1C: No results for input(s): HGBA1C in the last 72 hours.  CBG: Recent Labs  Lab 11/20/18 2036  GLUCAP 113*    Lipid Profile: No results for input(s): CHOL, HDL, LDLCALC, TRIG, CHOLHDL, LDLDIRECT in the last 72 hours.  Thyroid Function Tests: No results for input(s): TSH, T4TOTAL, FREET4, T3FREE, THYROIDAB in the last 72 hours.  Anemia Panel: No results for input(s): VITAMINB12, FOLATE, FERRITIN, TIBC, IRON, RETICCTPCT in the last 72 hours.  Urine analysis:    Component Value Date/Time   COLORURINE YELLOW 10/01/2018 1822   APPEARANCEUR CLEAR 10/01/2018 1822   APPEARANCEUR Clear 01/18/2018  1539   LABSPEC 1.010 10/01/2018 1822   PHURINE 5.0 10/01/2018 1822   GLUCOSEU NEGATIVE 10/01/2018 1822   HGBUR NEGATIVE 10/01/2018 1822   BILIRUBINUR NEGATIVE 10/01/2018 1822   BILIRUBINUR Negative 01/18/2018 1539   KETONESUR NEGATIVE 10/01/2018 1822   PROTEINUR NEGATIVE 10/01/2018 1822   UROBILINOGEN 0.2 09/06/2012 0213   NITRITE NEGATIVE 10/01/2018 1822   LEUKOCYTESUR NEGATIVE 10/01/2018 1822   LEUKOCYTESUR Negative 01/18/2018 1539    Sepsis Labs: Lactic Acid, Venous    Component Value Date/Time   LATICACIDVEN 1.7 10/01/2018 1833    MICROBIOLOGY: No results found for  this or any previous visit (from the past 240 hour(s)).  RADIOLOGY STUDIES/RESULTS: Dg Abd Portable 1v  Result Date: 11/03/2018 CLINICAL DATA:  Constipation EXAM: PORTABLE ABDOMEN - 1 VIEW COMPARISON:  11/01/2018 FINDINGS: There is a non obstructive bowel gas pattern. No supine evidence of free air. No organomegaly or suspicious calcification. No acute bony abnormality. IMPRESSION: No acute findings. Electronically Signed   By: Rolm Baptise M.D.   On: 11/03/2018 11:32   Dg Abd Portable 1v  Result Date: 11/01/2018 CLINICAL DATA:  Constipation EXAM: PORTABLE ABDOMEN - 1 VIEW COMPARISON:  CT 01/09/2015 FINDINGS: Large stool burden throughout the colon. No evidence of bowel obstruction or free air. Prior cholecystectomy. Surgical sutures in the left upper abdomen. IMPRESSION: Large stool burden.  No bowel obstruction or free air. Electronically Signed   By: Rolm Baptise M.D.   On: 11/01/2018 11:03     LOS: 47 days   Barb Merino, MD  [facility]  If 7PM-7AM, please contact night-coverage  Please page via [email or web address]-Password [email or web address]-click on MD name and type text message  11/27/2018, 9:50 AM

## 2018-11-28 MED ORDER — NALOXEGOL OXALATE 25 MG PO TABS
25.0000 mg | ORAL_TABLET | Freq: Every day | ORAL | Status: DC
  Administered 2018-11-30: 25 mg via ORAL
  Filled 2018-11-28 (×4): qty 1

## 2018-11-28 NOTE — Progress Notes (Signed)
PROGRESS NOTE        PATIENT DETAILS Name: [name] Age: 73 y.o. Sex: male Date of Birth: 05-Nov-1945 Admit Date: 10/01/2018 Admitting Physician Vianne Bulls, MD [id number], Hollice Espy, MD  Brief Narrative: Patient is a 73 y.o. male with history of A. 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  Found to have a soft tissue infection of the left hip area, acute kidney injury and hyperkalemia.  ID, orthopedics consulted-underwent left hip aspiration on 2/14, infectious disease recommending 6 weeks of IV Invanz. Finished 3/26.   Subjective:  Patient in bed, appears comfortable, denies any headache, no fever, no chest pain or pressure, no shortness of breath , no abdominal pain. No focal weakness. Complains of constipation.   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  Was on suppressive Flagyl for Bacteroides fragilis infection-recommendations from ID are for 6 weeks of IV ertapenem-end date of November 25, 2018. He has finished all his antibiotics while in the hospital.  AKI: Likely hemodynamically mediated-secondary to soft tissue infection of the left hip, diuretic use.  Resolved with supportive care.  Improved and normalized.  Hyperkalemia: Suspect secondary to AKI with use of Aldactone and potassium supplementation.  Resolved.    History of left  total hip arthroplasty with subsequent infection with Bacteroides fragilis: Patient was evaluated by infectious disease at [facility] was previously on Flagyl.  Now on IV Invanz (as above)-Flagyl has been discontinued. He has finished all his antibiotics.  Recurrent pleural effusion: Felt to be secondary to hepatic hydrothorax-most recent CT chest on 10/07/2018 shows a small pleural effusion-patient is asymptomatic at this point.  Doubt further work-up required-furthermore he has had a recent extensive work-up including multiple thoracocentesis   Cryptogenic cirrhosis: Prior hepatitis serology done earlier this month was negative-agree with plans outlined in prior discharge summary for outpatient follow-up.  Volume status is stable-he is awake and alert-continue Aldactone  Atrial fibrillation: Rate controlled with Cardizem.Continue Eliquis.  Chronic back and right shoulder pain: Continue Lidoderm patch, Tylenol and as needed narcotics.  Bipolar disorder: Stable-continue Lexapro, Neurontin  Constipation: Bowel regimen, chronic narcotics, placed on Movantik on 11/28/2018.  Right shoulder pain: chornic improves with steroid trials..  OSA: Continue CPAP nightly  Morbid obesity  Acute on chronic debility/deconditioning: Has chronic debility/deconditioning at baseline-this is worsened due to acute illness/AKI/left hip infection.  Transfer to skilled nursing when bed is available.  Medically stable.  Insomnia: better with trazodone.  DVT Prophylaxis: Full dose anticoagulation with Eliquis  Code Status: Full code  Family Communication: No family at bedside.  Disposition Plan: Remain inpatient- SNF on discharge when bed available.  Medically stable.  Unable to transition out of the hospital.  Antimicrobial agents: Anti-infectives (From admission, onward)   Start     Dose/Rate Route Frequency Ordered Stop   10/15/18 1500  cefTRIAXone (ROCEPHIN) 2 g in  sodium chloride  0.9 % 100 mL IVPB  Status:  Discontinued     2 g 200 mL/hr over 30 Minutes Intravenous Every 24 hours 10/15/18 1434 10/15/18 1447   10/15/18 1500  ertapenem (INVANZ) 1,000 mg in sodium chloride 0.9 % 100 mL IVPB     1 g 200 mL/hr over 30 Minutes Intravenous Every 24 hours 10/15/18 1447 11/25/18 1604   10/08/18 0000  doxycycline (VIBRA-TABS) 100 MG tablet  Status:  Discontinued     100 mg Oral Every 12 hours 10/08/18 0921 11/26/18    10/06/18 1000  doxycycline (VIBRA-TABS) tablet 100 mg  Status:  Discontinued     100 mg Oral Every 12 hours 10/06/18 0726 10/11/18 1328   10/03/18 1800  metroNIDAZOLE (FLAGYL) tablet 500 mg  Status:  Discontinued     500 mg Oral Every 8 hours 10/03/18 1054 10/15/18 1447   10/03/18 0700  vancomycin (VANCOCIN) 2,000 mg in sodium chloride 0.9 % 500 mL IVPB  Status:  Discontinued     2,000 mg 250 mL/hr over 120 Minutes Intravenous Every 36 hours 10/01/18 1942 10/06/18 0726   10/02/18 0200  ceFEPIme (MAXIPIME) 2 g in sodium chloride 0.9 % 100 mL IVPB  Status:  Discontinued     2 g 200 mL/hr over 30 Minutes Intravenous Every 8 hours 10/01/18 1942 10/02/18 1008   10/01/18 1830  vancomycin (VANCOCIN) 2,500 mg in sodium chloride 0.9 % 500 mL IVPB     2,500 mg 250 mL/hr over 120 Minutes Intravenous STAT 10/01/18 1825 10/02/18 0126   10/01/18 1800  ceFEPIme (MAXIPIME) 2 g in sodium chloride 0.9 % 100 mL IVPB     2 g 200 mL/hr over 30 Minutes Intravenous  Once 10/01/18 1747 10/01/18 1903   10/01/18 1800  metroNIDAZOLE (FLAGYL) IVPB 500 mg  Status:  Discontinued     500 mg 100 mL/hr over 60 Minutes Intravenous Every 8 hours 10/01/18 1747 10/03/18 1054   10/01/18 1800  vancomycin (VANCOCIN) IVPB 1000 mg/200 mL premix  Status:  Discontinued     1,000 mg 200 mL/hr over 60 Minutes Intravenous  Once 10/01/18 1747 10/01/18 1825      Procedures: None  CONSULTS:  None  Time spent: 15- minutes  MEDICATIONS: Scheduled Meds: . apixaban  5 mg Oral BID  . Chlorhexidine  Gluconate Cloth  6 each Topical Daily  . cycloSPORINE  1 drop Both Eyes BID  . diclofenac sodium  2 g Topical BID  . diltiazem  120 mg Oral Daily  . escitalopram  10 mg Oral Daily  . gabapentin  100 mg Oral TID  . lactulose  30 g Oral BID  . lidocaine  1 patch Transdermal Q24H  . Melatonin  6 mg Oral QHS  . midodrine  10 mg Oral TID WC  . multivitamin with minerals  1 tablet Oral Daily  . naloxegol oxalate  25 mg Oral Daily  . pantoprazole  40 mg Oral Daily  . polyethylene glycol  17 g Oral BID  . Ensure Max Protein  11 oz Oral BID  . senna-docusate  2 tablet Oral BID  . spironolactone  50 mg Oral Daily  . thiamine  100 mg Oral Daily   Continuous Infusions: . sodium chloride 10 mL/hr at 11/21/18 1900   PRN Meds:.sodium chloride, acetaminophen, albuterol, alum & mag hydroxide-simeth, diltiazem, guaiFENesin-dextromethorphan, hydrOXYzine, menthol-cetylpyridinium, [DISCONTINUED] ondansetron **OR** ondansetron (ZOFRAN) IV, oxyCODONE, sodium chloride flush, sodium phosphate, traZODone   PHYSICAL EXAM: Vital signs: Vitals:   11/27/18  0354 11/27/18 1440 11/27/18 2123 11/28/18 0506  BP: 122/66 116/67 113/74 113/83  Pulse: 61 94 91 74  Resp: 18 18 18    Temp: 97.7 F (36.5 C) 98.3 F (36.8 C) 99 F (37.2 C) 97.7 F (36.5 C)  TempSrc: Oral Oral Oral Oral  SpO2: 98% 97% 97% 99%  Weight:    121.9 kg  Height:       Filed Weights   11/25/18 0536 11/26/18 0516 11/28/18 0506  Weight: 122 kg 124.2 kg 121.9 kg   Body mass index is 40.86 kg/m.   Exam  Awake Alert, Oriented X 3, No new F.N deficits, Normal affect [place].AT,PERRAL Supple Neck,No JVD, No cervical lymphadenopathy appriciated.  Symmetrical Chest wall movement, Good air movement bilaterally, CTAB RRR,No Gallops, Rubs or new Murmurs, No Parasternal Heave +ve B.Sounds, Abd Soft, No tenderness, No organomegaly appriciated, No rebound - guarding or rigidity. No Cyanosis, Clubbing or edema, No new Rash or bruise, L hip  stable   LABORATORY DATA: CBC: Recent Labs  Lab 11/22/18 0336  WBC 6.7  HGB 11.1*  HCT 36.0*  MCV 90.9  PLT 494    Basic Metabolic Panel: Recent Labs  Lab 11/22/18 0336 11/27/18 0348  NA 136 137  K 4.4 4.9  CL 103 106  CO2 25 24  GLUCOSE 108* 113*  BUN 35* 48*  CREATININE 0.98 0.96  CALCIUM 10.5* 9.5    GFR: Estimated Creatinine Clearance: 88.3 mL/min (by C-G formula based on SCr of 0.96 mg/dL).  Liver Function Tests: No results for input(s): AST, ALT, ALKPHOS, BILITOT, PROT, ALBUMIN in the last 168 hours. No results for input(s): LIPASE, AMYLASE in the last 168 hours. No results for input(s): AMMONIA in the last 168 hours.  Coagulation Profile: No results for input(s): INR, PROTIME in the last 168 hours.  Cardiac Enzymes: No results for input(s): CKTOTAL, CKMB, CKMBINDEX, TROPONINI in the last 168 hours.  BNP (last 3 results) No results for input(s): PROBNP in the last 8760 hours.  HbA1C: No results for input(s): HGBA1C in the last 72 hours.  CBG: No results for input(s): GLUCAP in the last 168 hours.  Lipid Profile: No results for input(s): CHOL, HDL, LDLCALC, TRIG, CHOLHDL, LDLDIRECT in the last 72 hours.  Thyroid Function Tests: No results for input(s): TSH, T4TOTAL, FREET4, T3FREE, THYROIDAB in the last 72 hours.  Anemia Panel: No results for input(s): VITAMINB12, FOLATE, FERRITIN, TIBC, IRON, RETICCTPCT in the last 72 hours.  Urine analysis:    Component Value Date/Time   COLORURINE YELLOW 10/01/2018 1822   APPEARANCEUR CLEAR 10/01/2018 1822   APPEARANCEUR Clear 01/18/2018 1539   LABSPEC 1.010 10/01/2018 1822   PHURINE 5.0 10/01/2018 1822   GLUCOSEU NEGATIVE 10/01/2018 1822   HGBUR NEGATIVE 10/01/2018 1822   BILIRUBINUR NEGATIVE 10/01/2018 1822   BILIRUBINUR Negative 01/18/2018 1539   KETONESUR NEGATIVE 10/01/2018 1822   PROTEINUR NEGATIVE 10/01/2018 1822   UROBILINOGEN 0.2 09/06/2012 0213   NITRITE NEGATIVE 10/01/2018 1822    LEUKOCYTESUR NEGATIVE 10/01/2018 1822   LEUKOCYTESUR Negative 01/18/2018 1539    Sepsis Labs: Lactic Acid, Venous    Component Value Date/Time   LATICACIDVEN 1.7 10/01/2018 1833    MICROBIOLOGY: No results found for this or any previous visit (from the past 240 hour(s)).  RADIOLOGY STUDIES/RESULTS: Dg Abd Portable 1v  Result Date: 11/03/2018 CLINICAL DATA:  Constipation EXAM: PORTABLE ABDOMEN - 1 VIEW COMPARISON:  11/01/2018 FINDINGS: There is a non obstructive bowel gas pattern. No supine evidence of free air. No organomegaly or  suspicious calcification. No acute bony abnormality. IMPRESSION: No acute findings. Electronically Signed   By: Rolm Baptise M.D.   On: 11/03/2018 11:32   Dg Abd Portable 1v  Result Date: 11/01/2018 CLINICAL DATA:  Constipation EXAM: PORTABLE ABDOMEN - 1 VIEW COMPARISON:  CT 01/09/2015 FINDINGS: Large stool burden throughout the colon. No evidence of bowel obstruction or free air. Prior cholecystectomy. Surgical sutures in the left upper abdomen. IMPRESSION: Large stool burden.  No bowel obstruction or free air. Electronically Signed   By: Rolm Baptise M.D.   On: 11/01/2018 11:03     LOS: 65 days    Signature  Lala Lund M.D on 11/28/2018 at 9:42 AM   -  To page go to [email or web address]

## 2018-11-28 NOTE — Progress Notes (Signed)
Lateral aspect of LUE edematous with erythema noted. Pt reports proximal arm is tender to touch and "hot".  [place], [place] aware.

## 2018-11-28 NOTE — Progress Notes (Signed)
PICC assessment: Appears PICC is no longer needed. Please consider removing PICC.

## 2018-11-29 ENCOUNTER — Inpatient Hospital Stay (HOSPITAL_COMMUNITY): Payer: Medicare Other

## 2018-11-29 MED ORDER — OXYCODONE HCL 5 MG PO TABS
5.0000 mg | ORAL_TABLET | Freq: Four times a day (QID) | ORAL | Status: DC | PRN
  Administered 2018-11-29 – 2018-11-30 (×3): 5 mg via ORAL
  Filled 2018-11-29 (×3): qty 1

## 2018-11-29 NOTE — Progress Notes (Signed)
Physical Therapy Treatment Patient Details Name: [name] MRN: [id number] DOB: March 26, 1946 Today's Date: 11/29/2018    History of Present Illness 73 y.o. male admitted 10/01/18 with generalized weakness, falls and SOB. Workup revealed afib with recurrent R-side pleural effusion; AKI. Pt with L hip cellulitis with chronic L hip prosthetic joint infection; s/p joint aspiration 2/14. PMH includes HTN, afib on Eliquis, OSA, bipolar disorder, obesity, L THA with infection and multiple prolonged hospitalizations.    PT Comments    Pt able to complete std pvt transfer to the recliner today with RW and modAx2. Pt able to complete LE there ex in sitting x 10 reps. Pt did require freq rest breaks due to fatigue and back and buttock pain at 8/10. Cont to recommend SNF upon d/c. Acute PT to cont to follow.   Follow Up Recommendations  SNF     Equipment Recommendations  Wheelchair (measurements PT);Wheelchair cushion (measurements PT);Hospital bed    Recommendations for Other Services       Precautions / Restrictions Precautions Precautions: Fall;Posterior Hip Precaution Booklet Issued: No Precaution Comments: h/o falls, very weak, per pt report posterior hip precautions. Restrictions Weight Bearing Restrictions: No    Mobility  Bed Mobility Overal bed mobility: Needs Assistance Bed Mobility: Supine to Sit     Supine to sit: Min assist;Mod assist     General bed mobility comments: HOB very elevated, definite use of bed rail, modA for trunk elevation, pt with onset of dizziness but diminished with time, pt able to scoot hips while holding onto PT for stability  Transfers Overall transfer level: Needs assistance Equipment used: Rolling walker (2 wheeled) Transfers: Sit to/from Stand Sit to Stand: Mod assist;+2 physical assistance Stand pivot transfers: Mod assist;+2 physical assistance       General transfer comment: with bed surface elevated, pt able to push up with hands and  PT & Tech able to assist with power up with gait belt and bed pad undernead patient to full standing, pt required modAx2 t power up and shift weight forward, pt then able to std pvt transfer to the bed taking 6 small steps but able to clear both L and R foot, pt with increased pain however requiring min/modA for walker management and encouragement only.   Ambulation/Gait             General Gait Details: unable this date   Stairs             Wheelchair Mobility    Modified Rankin (Stroke Patients Only)       Balance Overall balance assessment: Needs assistance Sitting-balance support: Feet supported;Bilateral upper extremity supported Sitting balance-[name]: Poor Sitting balance - Comments: reliant on UE assist Postural control: Posterior lean Standing balance support: Bilateral upper extremity supported Standing balance-[name]: Poor Standing balance comment: Reliant on UE support                             Cognition Arousal/Alertness: Awake/alert Behavior During Therapy: Anxious Overall Cognitive Status: Within Functional Limits for tasks assessed                                 General Comments: pt anxious at baseline, likes things done a certain way, likes to be in control      Exercises General Exercises - Lower Extremity Ankle Circles/Pumps: AROM;Both;10 reps;Seated Gluteal Sets: AROM;Strengthening;Both;Seated Long Arc  Quad: AROM;Strengthening;Both;10 reps;Seated Hip ABduction/ADduction: PROM;Both;10 reps;Seated Hip Flexion/Marching: AROM;Both;10 reps;Seated Toe Raises: AROM;Both;10 reps;Seated Heel Raises: AROM;Strengthening;Both;10 reps;Seated    General Comments General comments (skin integrity, edema, etc.): VSS      Pertinent Vitals/Pain Pain Assessment: 0-10 Pain Score: 8  Pain Location: lower back, R hip and R shoulder Pain Descriptors / Indicators: Aching;Sore;Grimacing Pain Intervention(s): Monitored  during session    Home Living                      Prior Function            PT Goals (current goals can now be found in the care plan section) Acute Rehab PT Goals PT Goal Formulation: With patient Time For Goal Achievement: 12/13/18 Potential to Achieve Goals: Fair Progress towards PT goals: Goals met and updated - see care plan    Frequency    Min 3X/week      PT Plan Current plan remains appropriate    Co-evaluation              AM-PAC PT "6 Clicks" Mobility   Outcome Measure  Help needed turning from your back to your side while in a flat bed without using bedrails?: A Lot Help needed moving from lying on your back to sitting on the side of a flat bed without using bedrails?: A Lot Help needed moving to and from a bed to a chair (including a wheelchair)?: A Lot Help needed standing up from a chair using your arms (e.g., wheelchair or bedside chair)?: A Lot Help needed to walk in hospital room?: A Lot Help needed climbing 3-5 steps with a railing? : Total 6 Click Score: 11    End of Session Equipment Utilized During Treatment: Gait belt Activity Tolerance: Patient tolerated treatment well Patient left: in chair;with call bell/phone within reach Nurse Communication: Mobility status PT Visit Diagnosis: Other abnormalities of gait and mobility (R26.89);Muscle weakness (generalized) (M62.81);Difficulty in walking, not elsewhere classified (R26.2);History of falling (Z91.81)     Time: 3832-9191 PT Time Calculation (min) (ACUTE ONLY): 29 min  Charges:  $Therapeutic Exercise: 8-22 mins $Therapeutic Activity: 8-22 mins                     [name], PT, DPT Acute Rehabilitation Services Pager #: [phone] Office #: [phone]    [name] 11/29/2018, 10:38 AM

## 2018-11-29 NOTE — Care Management Important Message (Signed)
Important Message  Patient Details  Name: [name] MRN: [id number] Date of Birth: June 15, 1946   Medicare Important Message Given:  Yes    [name] 11/29/2018, 2:33 PM

## 2018-11-29 NOTE — Progress Notes (Signed)
Patient has home CPAP machine. Places himself on/off. No issues noted.

## 2018-11-29 NOTE — Progress Notes (Signed)
PROGRESS NOTE        PATIENT DETAILS Name: [name] Age: 73 y.o. Sex: male Date of Birth: 1946/02/21 Admit Date: 10/01/2018 Admitting Physician Vianne Bulls, MD [id number], Hollice Espy, MD  Brief Narrative: Patient is a 73 y.o. male with history of A. 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  Found to have a soft tissue infection of the left hip area, acute kidney injury and hyperkalemia.  ID, orthopedics consulted-underwent left hip aspiration on 2/14, infectious disease recommending 6 weeks of IV Invanz. Finished 3/26.   Subjective:  Patient in bed, appears comfortable, denies any headache, no fever, no chest pain or pressure, no shortness of breath , no abdominal pain. No focal weakness.   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  Was on suppressive Flagyl for Bacteroides fragilis infection-recommendations from ID are for 6 weeks of IV ertapenem-end date of November 25, 2018. He has finished all his antibiotics while in the hospital.  AKI: Likely hemodynamically mediated-secondary to soft tissue infection of the left hip, diuretic use.  Resolved with supportive care.    Hyperkalemia: Suspect secondary to AKI with use of Aldactone and potassium supplementation.  Resolved.   History of left total hip arthroplasty with subsequent infection with  Bacteroides fragilis: Patient was evaluated by [facility] at [facility] was previously on Flagyl.  Now on IV Invanz (as above)-Flagyl has been discontinued. He has finished all his antibiotics.  Recurrent pleural effusion: Felt to be secondary to hepatic hydrothorax-most recent CT chest on 10/07/2018 shows a small pleural effusion-patient is asymptomatic at this point.  Doubt further work-up required-furthermore he has had a recent extensive work-up including multiple thoracocentesis   Cryptogenic cirrhosis: Prior hepatitis serology done earlier this month was negative-agree with plans outlined in prior discharge summary for outpatient follow-up.  Volume status is stable-he is awake and alert-continue Aldactone  Atrial fibrillation: Rate controlled with Cardizem.Continue Eliquis.  Chronic back and right shoulder pain: Continue Lidoderm patch, Tylenol and as needed narcotics.  Bipolar disorder: Stable-continue Lexapro, Neurontin  Constipation: Bowel regimen, exam and x-ray unremarkable, placed on Movantik on 11/28/2018 continue, he has narcotic bowel.  Right shoulder pain: chornic improves with steroid trials..  OSA: Continue CPAP nightly  Morbid obesity  Acute on chronic debility/deconditioning: Has chronic debility/deconditioning at baseline-this is worsened due to acute illness/AKI/left hip infection.  Transfer to skilled nursing when bed is available.  Medically stable.  Insomnia: better with trazodone.  DVT Prophylaxis: Full dose anticoagulation with Eliquis  Code Status: Full code  Family Communication: No family at bedside.  Disposition Plan: Remain inpatient- SNF on discharge when bed available.  Medically stable.  Unable to transition out of the hospital.  Antimicrobial agents: Anti-infectives (From admission, onward)   Start     Dose/Rate Route Frequency Ordered Stop   10/15/18 1500  cefTRIAXone (ROCEPHIN) 2  g in sodium chloride 0.9 % 100 mL  IVPB  Status:  Discontinued     2 g 200 mL/hr over 30 Minutes Intravenous Every 24 hours 10/15/18 1434 10/15/18 1447   10/15/18 1500  ertapenem (INVANZ) 1,000 mg in sodium chloride 0.9 % 100 mL IVPB     1 g 200 mL/hr over 30 Minutes Intravenous Every 24 hours 10/15/18 1447 11/25/18 1604   10/08/18 0000  doxycycline (VIBRA-TABS) 100 MG tablet  Status:  Discontinued     100 mg Oral Every 12 hours 10/08/18 0921 11/26/18    10/06/18 1000  doxycycline (VIBRA-TABS) tablet 100 mg  Status:  Discontinued     100 mg Oral Every 12 hours 10/06/18 0726 10/11/18 1328   10/03/18 1800  metroNIDAZOLE (FLAGYL) tablet 500 mg  Status:  Discontinued     500 mg Oral Every 8 hours 10/03/18 1054 10/15/18 1447   10/03/18 0700  vancomycin (VANCOCIN) 2,000 mg in sodium chloride 0.9 % 500 mL IVPB  Status:  Discontinued     2,000 mg 250 mL/hr over 120 Minutes Intravenous Every 36 hours 10/01/18 1942 10/06/18 0726   10/02/18 0200  ceFEPIme (MAXIPIME) 2 g in sodium chloride 0.9 % 100 mL IVPB  Status:  Discontinued     2 g 200 mL/hr over 30 Minutes Intravenous Every 8 hours 10/01/18 1942 10/02/18 1008   10/01/18 1830  vancomycin (VANCOCIN) 2,500 mg in sodium chloride 0.9 % 500 mL IVPB     2,500 mg 250 mL/hr over 120 Minutes Intravenous STAT 10/01/18 1825 10/02/18 0126   10/01/18 1800  ceFEPIme (MAXIPIME) 2 g in sodium chloride 0.9 % 100 mL IVPB     2 g 200 mL/hr over 30 Minutes Intravenous  Once 10/01/18 1747 10/01/18 1903   10/01/18 1800  metroNIDAZOLE (FLAGYL) IVPB 500 mg  Status:  Discontinued     500 mg 100 mL/hr over 60 Minutes Intravenous Every 8 hours 10/01/18 1747 10/03/18 1054   10/01/18 1800  vancomycin (VANCOCIN) IVPB 1000 mg/200 mL premix  Status:  Discontinued     1,000 mg 200 mL/hr over 60 Minutes Intravenous  Once 10/01/18 1747 10/01/18 1825      Procedures: None  CONSULTS:  None  Time spent: 15- minutes  MEDICATIONS: Scheduled Meds: . apixaban  5 mg Oral BID  . Chlorhexidine Gluconate  Cloth  6 each Topical Daily  . cycloSPORINE  1 drop Both Eyes BID  . diclofenac sodium  2 g Topical BID  . diltiazem  120 mg Oral Daily  . escitalopram  10 mg Oral Daily  . gabapentin  100 mg Oral TID  . lactulose  30 g Oral BID  . lidocaine  1 patch Transdermal Q24H  . Melatonin  6 mg Oral QHS  . midodrine  10 mg Oral TID WC  . multivitamin with minerals  1 tablet Oral Daily  . naloxegol oxalate  25 mg Oral Daily  . pantoprazole  40 mg Oral Daily  . polyethylene glycol  17 g Oral BID  . Ensure Max Protein  11 oz Oral BID  . senna-docusate  2 tablet Oral BID  . spironolactone  50 mg Oral Daily  . thiamine  100 mg Oral Daily   Continuous Infusions: . sodium chloride 10 mL/hr at 11/21/18 1900   PRN Meds:.sodium chloride, acetaminophen, albuterol, alum & mag hydroxide-simeth, diltiazem, guaiFENesin-dextromethorphan, hydrOXYzine, menthol-cetylpyridinium, [DISCONTINUED] ondansetron **OR** ondansetron (ZOFRAN) IV, oxyCODONE, sodium chloride flush, sodium phosphate, traZODone   PHYSICAL EXAM: Vital signs: Vitals:  11/28/18 2300 11/29/18 0554 11/29/18 0947 11/29/18 0952  BP: 94/73 (!) 87/60 (!) 90/57   Pulse:  81 62 (!) 59  Resp:  20    Temp:  (!) 97.4 F (36.3 C) (!) 97.4 F (36.3 C)   TempSrc:  Oral Oral   SpO2:  99% (!) 87% 94%  Weight:  121.1 kg    Height:       Filed Weights   11/26/18 0516 11/28/18 0506 11/29/18 0554  Weight: 124.2 kg 121.9 kg 121.1 kg   Body mass index is 40.59 kg/m.   Exam  Awake Alert, Oriented X 3, No new F.N deficits, Normal affect [place].AT,PERRAL Supple Neck,No JVD, No cervical lymphadenopathy appriciated.  Symmetrical Chest wall movement, Good air movement bilaterally, CTAB RRR,No Gallops, Rubs or new Murmurs, No Parasternal Heave +ve B.Sounds, Abd Soft, No tenderness, No organomegaly appriciated, No rebound - guarding or rigidity. No Cyanosis, Clubbing or edema, No new Rash or bruise, L hip stable   LABORATORY DATA: CBC: No results for  input(s): WBC, NEUTROABS, HGB, HCT, MCV, PLT in the last 168 hours.  Basic Metabolic Panel: Recent Labs  Lab 11/27/18 0348  NA 137  K 4.9  CL 106  CO2 24  GLUCOSE 113*  BUN 48*  CREATININE 0.96  CALCIUM 9.5    GFR: Estimated Creatinine Clearance: 88 mL/min (by C-G formula based on SCr of 0.96 mg/dL).  Liver Function Tests: No results for input(s): AST, ALT, ALKPHOS, BILITOT, PROT, ALBUMIN in the last 168 hours. No results for input(s): LIPASE, AMYLASE in the last 168 hours. No results for input(s): AMMONIA in the last 168 hours.  Coagulation Profile: No results for input(s): INR, PROTIME in the last 168 hours.  Cardiac Enzymes: No results for input(s): CKTOTAL, CKMB, CKMBINDEX, TROPONINI in the last 168 hours.  BNP (last 3 results) No results for input(s): PROBNP in the last 8760 hours.  HbA1C: No results for input(s): HGBA1C in the last 72 hours.  CBG: No results for input(s): GLUCAP in the last 168 hours.  Lipid Profile: No results for input(s): CHOL, HDL, LDLCALC, TRIG, CHOLHDL, LDLDIRECT in the last 72 hours.  Thyroid Function Tests: No results for input(s): TSH, T4TOTAL, FREET4, T3FREE, THYROIDAB in the last 72 hours.  Anemia Panel: No results for input(s): VITAMINB12, FOLATE, FERRITIN, TIBC, IRON, RETICCTPCT in the last 72 hours.  Urine analysis:    Component Value Date/Time   COLORURINE YELLOW 10/01/2018 1822   APPEARANCEUR CLEAR 10/01/2018 1822   APPEARANCEUR Clear 01/18/2018 1539   LABSPEC 1.010 10/01/2018 1822   PHURINE 5.0 10/01/2018 1822   GLUCOSEU NEGATIVE 10/01/2018 1822   HGBUR NEGATIVE 10/01/2018 1822   BILIRUBINUR NEGATIVE 10/01/2018 1822   BILIRUBINUR Negative 01/18/2018 1539   KETONESUR NEGATIVE 10/01/2018 1822   PROTEINUR NEGATIVE 10/01/2018 1822   UROBILINOGEN 0.2 09/06/2012 0213   NITRITE NEGATIVE 10/01/2018 1822   LEUKOCYTESUR NEGATIVE 10/01/2018 1822   LEUKOCYTESUR Negative 01/18/2018 1539    Sepsis Labs: Lactic Acid,  Venous    Component Value Date/Time   LATICACIDVEN 1.7 10/01/2018 1833    MICROBIOLOGY: No results found for this or any previous visit (from the past 240 hour(s)).  RADIOLOGY STUDIES/RESULTS: Dg Abd Portable 1v  Result Date: 11/29/2018 CLINICAL DATA:  Constipation EXAM: PORTABLE ABDOMEN - 1 VIEW COMPARISON:  11/03/2018 FINDINGS: Formed stool throughout most colonic segments. No evidence of small bowel obstruction or rectal impaction. Postoperative stomach. Cholecystectomy clips. Bilateral hip arthroplasty. IMPRESSION: Generalized stool correlating with history of constipation. No evidence of obstruction  or rectal impaction. Electronically Signed   By: Monte Fantasia M.D.   On: 11/29/2018 07:35   Dg Abd Portable 1v  Result Date: 11/03/2018 CLINICAL DATA:  Constipation EXAM: PORTABLE ABDOMEN - 1 VIEW COMPARISON:  11/01/2018 FINDINGS: There is a non obstructive bowel gas pattern. No supine evidence of free air. No organomegaly or suspicious calcification. No acute bony abnormality. IMPRESSION: No acute findings. Electronically Signed   By: Rolm Baptise M.D.   On: 11/03/2018 11:32   Dg Abd Portable 1v  Result Date: 11/01/2018 CLINICAL DATA:  Constipation EXAM: PORTABLE ABDOMEN - 1 VIEW COMPARISON:  CT 01/09/2015 FINDINGS: Large stool burden throughout the colon. No evidence of bowel obstruction or free air. Prior cholecystectomy. Surgical sutures in the left upper abdomen. IMPRESSION: Large stool burden.  No bowel obstruction or free air. Electronically Signed   By: Rolm Baptise M.D.   On: 11/01/2018 11:03     LOS: 23 days    Signature  Lala Lund M.D on 11/29/2018 at 9:58 AM   -  To page go to [email or web address]

## 2018-11-29 NOTE — Progress Notes (Signed)
Physical Therapy Treatment Patient Details Name: [name] MRN: [id number] DOB: 1946/08/23 Today's Date: 11/29/2018    History of Present Illness 73 y.o. male admitted 10/01/18 with generalized weakness, falls and SOB. Workup revealed afib with recurrent R-side pleural effusion; AKI. Pt with L hip cellulitis with chronic L hip prosthetic joint infection; s/p joint aspiration 2/14. PMH includes HTN, afib on Eliquis, OSA, bipolar disorder, obesity, L THA with infection and multiple prolonged hospitalizations.    PT Comments    Pt calling out to get back in bed. Pt refused to try to stand and use RW despite max encouragement and attempting x3 stating "I don't feel comfortable, please get the automatic lift." Colletta Maryland from mobility team assisted myself with the sara lift to transfer patient back to bed and RN assist with positioning pt in the bed. Acute PT to con't to follow.   Follow Up Recommendations  SNF     Equipment Recommendations  Wheelchair (measurements PT);Wheelchair cushion (measurements PT);Hospital bed    Recommendations for Other Services       Precautions / Restrictions Precautions Precautions: Fall;Posterior Hip Precaution Booklet Issued: No Precaution Comments: h/o falls, very weak, per pt report posterior hip precautions. Restrictions Weight Bearing Restrictions: No    Mobility  Bed Mobility Overal bed mobility: Needs Assistance Bed Mobility: Sit to Supine     Supine to sit: Min assist;Mod assist Sit to supine: Mod assist;+2 for physical assistance   General bed mobility comments: pt required assist for LE management back into bed  Transfers Overall transfer level: Needs assistance Equipment used: Rolling walker (2 wheeled) Transfers: Sit to/from Stand Sit to Stand: Mod assist;+2 physical assistance Stand pivot transfers: Mod assist;+2 physical assistance       General transfer comment: pt adamantly refused to complete std pvt back to bed with RW  and assist of 2 people. used sara lift   Ambulation/Gait             General Gait Details: unable this date   Marine scientist [place] (Stroke Patients Only)       Balance Overall balance assessment: Needs assistance Sitting-balance support: Feet supported;Bilateral upper extremity supported Sitting balance-[name]: Poor Sitting balance - Comments: reliant on UE assist Postural control: Posterior lean Standing balance support: Bilateral upper extremity supported Standing balance-[name]: Poor Standing balance comment: Reliant on UE support                             Cognition Arousal/Alertness: Awake/alert Behavior During Therapy: Anxious Overall Cognitive Status: Within Functional Limits for tasks assessed                                 General Comments: pt very anxious regarding transferring back to bed with RW due to pain stating "I know my limits please get the automatic lift"      Exercises General Exercises - Lower Extremity Ankle Circles/Pumps: AROM;Both;10 reps;Seated Gluteal Sets: AROM;Strengthening;Both;Seated Long Arc Quad: AROM;Strengthening;Both;10 reps;Seated Hip ABduction/ADduction: PROM;Both;10 reps;Seated Hip Flexion/Marching: AROM;Both;10 reps;Seated Toe Raises: AROM;Both;10 reps;Seated Heel Raises: AROM;Strengthening;Both;10 reps;Seated    General Comments General comments (skin integrity, edema, etc.): pt with reports of dizziness with standing, pt with loose of color in face/lips, pt regained color once in supine      Pertinent Vitals/Pain Pain  Assessment: 0-10 Pain Score: 9  Pain Location: back, buttocks Pain Descriptors / Indicators: Sharp Pain Intervention(s): Monitored during session    Home Living                      Prior Function            PT Goals (current goals can now be found in the care plan section) Acute Rehab PT Goals PT Goal  Formulation: With patient Time For Goal Achievement: 12/13/18 Potential to Achieve Goals: Fair Progress towards PT goals: Progressing toward goals    Frequency    Min 3X/week      PT Plan Current plan remains appropriate    Co-evaluation              AM-PAC PT "6 Clicks" Mobility   Outcome Measure  Help needed turning from your back to your side while in a flat bed without using bedrails?: A Lot Help needed moving from lying on your back to sitting on the side of a flat bed without using bedrails?: A Lot Help needed moving to and from a bed to a chair (including a wheelchair)?: A Lot Help needed standing up from a chair using your arms (e.g., wheelchair or bedside chair)?: A Lot Help needed to walk in hospital room?: A Lot Help needed climbing 3-5 steps with a railing? : Total 6 Click Score: 11    End of Session Equipment Utilized During Treatment: Gait belt Activity Tolerance: Patient limited by pain Patient left: in bed;with call bell/phone within reach;with nursing/sitter in room Nurse Communication: Mobility status PT Visit Diagnosis: Other abnormalities of gait and mobility (R26.89);Muscle weakness (generalized) (M62.81);Difficulty in walking, not elsewhere classified (R26.2);History of falling (Z91.81)     Time: 5974-1638 PT Time Calculation (min) (ACUTE ONLY): 27 min  Charges:  $Therapeutic Exercise: 8-22 mins $Therapeutic Activity: 23-37 mins                     [name], PT, DPT Acute Rehabilitation Services Pager #: [phone] Office #: [phone]    Berline Lopes 11/29/2018, 11:30 AM

## 2018-11-30 MED ORDER — ZOLPIDEM TARTRATE 5 MG PO TABS: 10.0000 mg | ORAL_TABLET | Freq: Every evening | ORAL | Status: DC | PRN

## 2018-11-30 MED ORDER — ZOLPIDEM TARTRATE 5 MG PO TABS
5.0000 mg | ORAL_TABLET | Freq: Every evening | ORAL | Status: DC | PRN
  Administered 2018-11-30 – 2018-12-05 (×5): 5 mg via ORAL
  Filled 2018-11-30 (×5): qty 1

## 2018-11-30 MED ORDER — OXYCODONE HCL 5 MG PO TABS
5.0000 mg | ORAL_TABLET | ORAL | Status: DC | PRN
  Administered 2018-11-30 – 2018-12-02 (×6): 5 mg via ORAL
  Filled 2018-11-30 (×8): qty 1

## 2018-11-30 NOTE — TOC Progression Note (Signed)
Transition of Care [facility]) - Progression Note    Patient Details  Name: [name] MRN: [id number] Date of Birth: 10/01/1945  [facility] of [facility]) CM/SW [place], LCSW Phone Number: 11/30/2018, 1:35 PM  Clinical Narrative:    [place] staffed case with CSW AD again to check on DTP bed status. Still no availability yet. CSW requested assistance from Financial Counseling to see if we can submit a Medicaid application despite patient stating that he is receiving a large sum of money (but is unable to tell us when that is happening). They will submit application to [place] for patient to see if he can qualify.    Expected Discharge Plan: [name]    Expected Discharge Plan and Services Expected Discharge Plan: Skilled Nursing Facility In-house Referral: Clinical Social Work Discharge Planning Services: CM Consult     Expected Discharge Date: 11/26/18                         Social Determinants of Health (SDOH) Interventions    Readmission Risk Interventions Readmission Risk Prevention Plan 09/11/2018  Transportation Screening Complete  Medication Review Press photographer) Complete  HRI or Home Care Consult Complete  Some recent data might be hidden

## 2018-11-30 NOTE — Progress Notes (Signed)
PROGRESS NOTE        PATIENT DETAILS Name: [name] Age: 73 y.o. Sex: male Date of Birth: [birth year] Admit Date: 10/01/2018 Admitting Physician Vianne Bulls, MD [id number], Hollice Espy, MD  Brief Narrative: Patient is a 73 y.o. male with history of A. 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  Found to have a soft tissue infection of the left hip area, acute kidney injury and hyperkalemia.  ID, orthopedics consulted-underwent left hip aspiration on 2/14, infectious disease recommending 6 weeks of IV Invanz. Finished 3/26.   Subjective:  Patient in bed, appears comfortable, denies any headache, no fever, no chest pain or pressure, no shortness of breath , no abdominal pain. No focal weakness.    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  Was on suppressive Flagyl for Bacteroides fragilis infection-recommendations from ID are for 6 weeks of IV ertapenem-end date of November 25, 2018. He has finished all his antibiotics while in the hospital.  AKI: Likely hemodynamically mediated-secondary to soft tissue infection of the left hip, diuretic use.  Resolved with supportive care.    Hyperkalemia: Suspect secondary to AKI with use of Aldactone and potassium supplementation.  Resolved.   History of left total hip arthroplasty with subsequent infection  with Bacteroides fragilis: Patient was evaluated by infectious disease at [facility] was previously on Flagyl.  Now on IV Invanz (as above)-Flagyl has been discontinued. He has finished all his antibiotics.  Recurrent pleural effusion: Felt to be secondary to hepatic hydrothorax-most recent CT chest on 10/07/2018 shows a small pleural effusion-patient is asymptomatic at this point.  Doubt further work-up required-furthermore he has had a recent extensive work-up including multiple thoracocentesis   Cryptogenic cirrhosis: Prior hepatitis serology done earlier this month was negative-agree with plans outlined in prior discharge summary for outpatient follow-up.  Volume status is stable-he is awake and alert-continue Aldactone  Atrial fibrillation: Rate controlled with Cardizem.Continue Eliquis.  Chronic back and right shoulder pain: Continue Lidoderm patch, Tylenol and as needed narcotics.  Bipolar disorder: Stable-continue Lexapro, Neurontin  Constipation: Bowel regimen, exam and x-ray unremarkable, placed on Movantik on 11/28/2018 continue, he has narcotic bowel.  Right shoulder pain: chornic improves with steroid trials..  OSA: Continue CPAP nightly  Morbid obesity  Acute on chronic debility/deconditioning: Has chronic debility/deconditioning at baseline-this is worsened due to acute illness/AKI/left hip infection.  Transfer to skilled nursing when bed is available.  Medically stable.  Insomnia: Been placed on trazodone will add Ambien for better symptom control.  Still complaining of insomnia.    DVT Prophylaxis: Full dose anticoagulation with Eliquis  Code Status: Full code  Family Communication: No family at bedside.  Disposition Plan: Remain inpatient- SNF on discharge when bed available.  Medically stable.  Unable to transition out of the hospital.  Antimicrobial agents: Anti-infectives (From admission, onward)   Start  Dose/Rate Route Frequency  Ordered Stop   10/15/18 1500  cefTRIAXone (ROCEPHIN) 2 g in sodium chloride 0.9 % 100 mL IVPB  Status:  Discontinued     2 g 200 mL/hr over 30 Minutes Intravenous Every 24 hours 10/15/18 1434 10/15/18 1447   10/15/18 1500  ertapenem (INVANZ) 1,000 mg in sodium chloride 0.9 % 100 mL IVPB     1 g 200 mL/hr over 30 Minutes Intravenous Every 24 hours 10/15/18 1447 11/25/18 1604   10/08/18 0000  doxycycline (VIBRA-TABS) 100 MG tablet  Status:  Discontinued     100 mg Oral Every 12 hours 10/08/18 0921 11/26/18    10/06/18 1000  doxycycline (VIBRA-TABS) tablet 100 mg  Status:  Discontinued     100 mg Oral Every 12 hours 10/06/18 0726 10/11/18 1328   10/03/18 1800  metroNIDAZOLE (FLAGYL) tablet 500 mg  Status:  Discontinued     500 mg Oral Every 8 hours 10/03/18 1054 10/15/18 1447   10/03/18 0700  vancomycin (VANCOCIN) 2,000 mg in sodium chloride 0.9 % 500 mL IVPB  Status:  Discontinued     2,000 mg 250 mL/hr over 120 Minutes Intravenous Every 36 hours 10/01/18 1942 10/06/18 0726   10/02/18 0200  ceFEPIme (MAXIPIME) 2 g in sodium chloride 0.9 % 100 mL IVPB  Status:  Discontinued     2 g 200 mL/hr over 30 Minutes Intravenous Every 8 hours 10/01/18 1942 10/02/18 1008   10/01/18 1830  vancomycin (VANCOCIN) 2,500 mg in sodium chloride 0.9 % 500 mL IVPB     2,500 mg 250 mL/hr over 120 Minutes Intravenous STAT 10/01/18 1825 10/02/18 0126   10/01/18 1800  ceFEPIme (MAXIPIME) 2 g in sodium chloride 0.9 % 100 mL IVPB     2 g 200 mL/hr over 30 Minutes Intravenous  Once 10/01/18 1747 10/01/18 1903   10/01/18 1800  metroNIDAZOLE (FLAGYL) IVPB 500 mg  Status:  Discontinued     500 mg 100 mL/hr over 60 Minutes Intravenous Every 8 hours 10/01/18 1747 10/03/18 1054   10/01/18 1800  vancomycin (VANCOCIN) IVPB 1000 mg/200 mL premix  Status:  Discontinued     1,000 mg 200 mL/hr over 60 Minutes Intravenous  Once 10/01/18 1747 10/01/18 1825      Procedures: None  CONSULTS:  None  Time spent: 15-  minutes  MEDICATIONS: Scheduled Meds: . apixaban  5 mg Oral BID  . Chlorhexidine Gluconate Cloth  6 each Topical Daily  . cycloSPORINE  1 drop Both Eyes BID  . diclofenac sodium  2 g Topical BID  . diltiazem  120 mg Oral Daily  . escitalopram  10 mg Oral Daily  . gabapentin  100 mg Oral TID  . lactulose  30 g Oral BID  . lidocaine  1 patch Transdermal Q24H  . Melatonin  6 mg Oral QHS  . midodrine  10 mg Oral TID WC  . multivitamin with minerals  1 tablet Oral Daily  . naloxegol oxalate  25 mg Oral Daily  . pantoprazole  40 mg Oral Daily  . polyethylene glycol  17 g Oral BID  . Ensure Max Protein  11 oz Oral BID  . senna-docusate  2 tablet Oral BID  . spironolactone  50 mg Oral Daily  . thiamine  100 mg Oral Daily   Continuous Infusions: . sodium chloride 10 mL/hr at 11/21/18 1900   PRN Meds:.sodium chloride, acetaminophen, albuterol, alum & mag hydroxide-simeth, diltiazem, guaiFENesin-dextromethorphan, hydrOXYzine, menthol-cetylpyridinium, [DISCONTINUED] ondansetron **OR** ondansetron (ZOFRAN) IV, oxyCODONE, sodium  chloride flush, sodium phosphate, traZODone, zolpidem   PHYSICAL EXAM: Vital signs: Vitals:   11/29/18 1320 11/29/18 2240 11/30/18 0556 11/30/18 0558  BP: 101/65 100/73 117/78   Pulse: (!) 110 89 81   Resp: 15 16 18    Temp: 98.1 F (36.7 C) 98.2 F (36.8 C) 97.7 F (36.5 C)   TempSrc: Oral Oral Oral   SpO2: 100% 95% 95%   Weight:    122.8 kg  Height:       Filed Weights   11/28/18 0506 11/29/18 0554 11/30/18 0558  Weight: 121.9 kg 121.1 kg 122.8 kg   Body mass index is 41.16 kg/m.   Exam  Awake Alert, Oriented X 3, No new F.N deficits, Normal affect [place].AT,PERRAL Supple Neck,No JVD, No cervical lymphadenopathy appriciated.  Symmetrical Chest wall movement, Good air movement bilaterally, CTAB RRR,No Gallops, Rubs or new Murmurs, No Parasternal Heave +ve B.Sounds, Abd Soft, No tenderness, No organomegaly appriciated, No rebound - guarding or  rigidity. No Cyanosis, Clubbing or edema, No new Rash or bruise  LABORATORY DATA: CBC: No results for input(s): WBC, NEUTROABS, HGB, HCT, MCV, PLT in the last 168 hours.  Basic Metabolic Panel: Recent Labs  Lab 11/27/18 0348  NA 137  K 4.9  CL 106  CO2 24  GLUCOSE 113*  BUN 48*  CREATININE 0.96  CALCIUM 9.5    GFR: Estimated Creatinine Clearance: 88.7 mL/min (by C-G formula based on SCr of 0.96 mg/dL).  Liver Function Tests: No results for input(s): AST, ALT, ALKPHOS, BILITOT, PROT, ALBUMIN in the last 168 hours. No results for input(s): LIPASE, AMYLASE in the last 168 hours. No results for input(s): AMMONIA in the last 168 hours.  Coagulation Profile: No results for input(s): INR, PROTIME in the last 168 hours.  Cardiac Enzymes: No results for input(s): CKTOTAL, CKMB, CKMBINDEX, TROPONINI in the last 168 hours.  BNP (last 3 results) No results for input(s): PROBNP in the last 8760 hours.  HbA1C: No results for input(s): HGBA1C in the last 72 hours.  CBG: No results for input(s): GLUCAP in the last 168 hours.  Lipid Profile: No results for input(s): CHOL, HDL, LDLCALC, TRIG, CHOLHDL, LDLDIRECT in the last 72 hours.  Thyroid Function Tests: No results for input(s): TSH, T4TOTAL, FREET4, T3FREE, THYROIDAB in the last 72 hours.  Anemia Panel: No results for input(s): VITAMINB12, FOLATE, FERRITIN, TIBC, IRON, RETICCTPCT in the last 72 hours.  Urine analysis:    Component Value Date/Time   COLORURINE YELLOW 10/01/2018 1822   APPEARANCEUR CLEAR 10/01/2018 1822   APPEARANCEUR Clear 01/18/2018 1539   LABSPEC 1.010 10/01/2018 1822   PHURINE 5.0 10/01/2018 1822   GLUCOSEU NEGATIVE 10/01/2018 1822   HGBUR NEGATIVE 10/01/2018 1822   BILIRUBINUR NEGATIVE 10/01/2018 1822   BILIRUBINUR Negative 01/18/2018 1539   KETONESUR NEGATIVE 10/01/2018 1822   PROTEINUR NEGATIVE 10/01/2018 1822   UROBILINOGEN 0.2 09/06/2012 0213   NITRITE NEGATIVE 10/01/2018 1822    LEUKOCYTESUR NEGATIVE 10/01/2018 1822   LEUKOCYTESUR Negative 01/18/2018 1539    Sepsis Labs: Lactic Acid, Venous    Component Value Date/Time   LATICACIDVEN 1.7 10/01/2018 1833    MICROBIOLOGY: No results found for this or any previous visit (from the past 240 hour(s)).  RADIOLOGY STUDIES/RESULTS: Dg Abd Portable 1v  Result Date: 11/29/2018 CLINICAL DATA:  Constipation EXAM: PORTABLE ABDOMEN - 1 VIEW COMPARISON:  11/03/2018 FINDINGS: Formed stool throughout most colonic segments. No evidence of small bowel obstruction or rectal impaction. Postoperative stomach. Cholecystectomy clips. Bilateral hip arthroplasty. IMPRESSION: Generalized stool  correlating with history of constipation. No evidence of obstruction or rectal impaction. Electronically Signed   By: Monte Fantasia M.D.   On: 11/29/2018 07:35   Dg Abd Portable 1v  Result Date: 11/03/2018 CLINICAL DATA:  Constipation EXAM: PORTABLE ABDOMEN - 1 VIEW COMPARISON:  11/01/2018 FINDINGS: There is a non obstructive bowel gas pattern. No supine evidence of free air. No organomegaly or suspicious calcification. No acute bony abnormality. IMPRESSION: No acute findings. Electronically Signed   By: Rolm Baptise M.D.   On: 11/03/2018 11:32   Dg Abd Portable 1v  Result Date: 11/01/2018 CLINICAL DATA:  Constipation EXAM: PORTABLE ABDOMEN - 1 VIEW COMPARISON:  CT 01/09/2015 FINDINGS: Large stool burden throughout the colon. No evidence of bowel obstruction or free air. Prior cholecystectomy. Surgical sutures in the left upper abdomen. IMPRESSION: Large stool burden.  No bowel obstruction or free air. Electronically Signed   By: Rolm Baptise M.D.   On: 11/01/2018 11:03     LOS: 59 days    Signature  Lala Lund M.D on 11/30/2018 at 8:56 AM   -  To page go to [email or web address]

## 2018-11-30 NOTE — Progress Notes (Signed)
Pt wearing home cpap and tolerating well.

## 2018-11-30 NOTE — Progress Notes (Signed)
Physical Therapy Treatment Patient Details Name: [name] MRN: [id number] DOB: 1946/03/20 Today's Date: 11/30/2018    History of Present Illness 73 y.o. male admitted 10/01/18 with generalized weakness, falls and SOB. Workup revealed afib with recurrent R-side pleural effusion; AKI. Pt with L hip cellulitis with chronic L hip prosthetic joint infection; s/p joint aspiration 2/14. PMH includes HTN, afib on Eliquis, OSA, bipolar disorder, obesity, L THA with infection and multiple prolonged hospitalizations.    PT Comments    Pt performed supine to sit and presented with increased complaints of dizziness.  BP in supine pre transfer 97/73, moved to sitting edge of bed and decreased to 67/52.  Pt sat x 2 min and repeated BP 83/55, after five min 83/69, defferred further transfers as dizziness and vitals did not improve to progress mobility. SNF placement remains best option based on mobility.    Follow Up Recommendations  SNF     Equipment Recommendations  Wheelchair (measurements PT);Wheelchair cushion (measurements PT);Hospital bed    Recommendations for Other Services       Precautions / Restrictions Precautions Precautions: Fall;Posterior Hip Precaution Booklet Issued: No Precaution Comments: h/o falls, very weak, per pt report posterior hip precautions. Restrictions Weight Bearing Restrictions: No    Mobility  Bed Mobility Overal bed mobility: Needs Assistance Bed Mobility: Supine to Sit;Sit to Supine     Supine to sit: Mod assist;+2 for physical assistance Sit to supine: Mod assist;+2 for physical assistance   General bed mobility comments: Pt required assistance for LE advancement and trunk elevation and back to bed he required assistance to lower trunk and lift B LEs back into bed.  +2 assistance to boost in supine.    Transfers Overall transfer level: (Deferred after dizziness and +orthostasis.  BP dropped from 97/73 supine to 67/52.  )                   Ambulation/Gait                 Stairs             Wheelchair Mobility    Modified Rankin (Stroke Patients Only)       Balance Overall balance assessment: Needs assistance Sitting-balance support: Feet supported;Bilateral upper extremity supported Sitting balance-[name]: Poor Sitting balance - Comments: reliant on UE assist                                    Cognition Arousal/Alertness: Awake/alert Behavior During Therapy: Anxious Overall Cognitive Status: Within Functional Limits for tasks assessed                                        Exercises      General Comments        Pertinent Vitals/Pain Pain Assessment: 0-10 Pain Score: 6  Pain Location: back, buttocks Pain Descriptors / Indicators: Sharp Pain Intervention(s): Monitored during session;Repositioned    Home Living                      Prior Function            PT Goals (current goals can now be found in the care plan section) Acute Rehab PT Goals Patient Stated Goal: to be able to go home to his dog Potential to Achieve  Goals: Fair Progress towards PT goals: Progressing toward goals    Frequency    Min 3X/week      PT Plan Current plan remains appropriate    Co-evaluation              AM-PAC PT "6 Clicks" Mobility   Outcome Measure  Help needed turning from your back to your side while in a flat bed without using bedrails?: A Lot Help needed moving from lying on your back to sitting on the side of a flat bed without using bedrails?: A Lot Help needed moving to and from a bed to a chair (including a wheelchair)?: A Lot Help needed standing up from a chair using your arms (e.g., wheelchair or bedside chair)?: A Lot Help needed to walk in hospital room?: A Lot Help needed climbing 3-5 steps with a railing? : Total 6 Click Score: 11    End of Session Equipment Utilized During Treatment: Gait belt Activity Tolerance:  Patient limited by pain Patient left: in bed;with call bell/phone within reach;with nursing/sitter in room Nurse Communication: Mobility status PT Visit Diagnosis: Other abnormalities of gait and mobility (R26.89);Muscle weakness (generalized) (M62.81);Difficulty in walking, not elsewhere classified (R26.2);History of falling (Z91.81)     Time: 5997-7414 PT Time Calculation (min) (ACUTE ONLY): 21 min  Charges:  $Therapeutic Activity: 8-22 mins                     [name], PTA Acute Rehabilitation Services Pager [phone] Office [phone]     [name] 11/30/2018, 5:13 PM

## 2018-12-01 LAB — COMPREHENSIVE METABOLIC PANEL
ALT: 25 U/L (ref 0–44)
AST: 16 U/L (ref 15–41)
Albumin: 2.7 g/dL — ABNORMAL LOW (ref 3.5–5.0)
Alkaline Phosphatase: 88 U/L (ref 38–126)
Anion gap: 8 (ref 5–15)
BUN: 37 mg/dL — ABNORMAL HIGH (ref 8–23)
CO2: 27 mmol/L (ref 22–32)
Calcium: 9.9 mg/dL (ref 8.9–10.3)
Chloride: 103 mmol/L (ref 98–111)
Creatinine, Ser: 1.06 mg/dL (ref 0.61–1.24)
GFR calc Af Amer: >60 mL/min (ref >60)
GFR calc non Af Amer: >60 mL/min (ref >60)
Glucose, Bld: 108 mg/dL — ABNORMAL HIGH (ref 70–99)
Potassium: 4.3 mmol/L (ref 3.5–5.1)
Sodium: 138 mmol/L (ref 135–145)
Total Bilirubin: 0.6 mg/dL (ref 0.3–1.2)
Total Protein: 6.3 g/dL — ABNORMAL LOW (ref 6.5–8.1)

## 2018-12-01 LAB — MAGNESIUM: Magnesium: 2 mg/dL (ref 1.7–2.4)

## 2018-12-01 LAB — CBC
HCT: 35.6 % — ABNORMAL LOW (ref 39.0–52.0)
Hemoglobin: 11.4 g/dL — ABNORMAL LOW (ref 13.0–17.0)
MCH: 28.6 pg (ref 26.0–34.0)
MCHC: 32 g/dL (ref 30.0–36.0)
MCV: 89.2 fL (ref 80.0–100.0)
Platelets: 238 10*3/uL (ref 150–400)
RBC: 3.99 MIL/uL — ABNORMAL LOW (ref 4.22–5.81)
RDW: 14.9 % (ref 11.5–15.5)
WBC: 9.2 10*3/uL (ref 4.0–10.5)
nRBC: 0 % (ref 0.0–0.2)

## 2018-12-01 MED ORDER — NALOXEGOL OXALATE 12.5 MG PO TABS
12.5000 mg | ORAL_TABLET | Freq: Every day | ORAL | Status: AC
  Administered 2018-12-01 – 2018-12-02 (×2): 12.5 mg via ORAL
  Filled 2018-12-01 (×2): qty 1

## 2018-12-01 NOTE — Progress Notes (Signed)
Patient has home CPAP at bedside. RT filled water chamber with sterile water.  RT assistance not needed at this time.

## 2018-12-01 NOTE — Progress Notes (Signed)
PROGRESS NOTE        PATIENT DETAILS Name: [name] Age: 73 y.o. Sex: male Date of Birth: 1946/05/17 Admit Date: 10/01/2018 Admitting Physician Vianne Bulls, MD [id number], Hollice Espy, MD  Brief Narrative: Patient is a 73 y.o. male with history of A. 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  Found to have a soft tissue infection of the left hip area, acute kidney injury and hyperkalemia.  ID, orthopedics consulted-underwent left hip aspiration on 2/14, infectious disease recommending 6 weeks of IV Invanz. Finished 3/26.   Subjective:  Patient in bed, appears comfortable, denies any headache, no fever, no chest pain or pressure, no shortness of breath , no abdominal pain. No focal weakness.  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  Was on suppressive Flagyl for Bacteroides fragilis infection-recommendations from ID are for 6 weeks of IV ertapenem-end date of November 25, 2018. He has finished all his antibiotics while in the hospital.  AKI: Likely hemodynamically mediated-secondary to soft tissue infection of the left hip, diuretic use.  Resolved with supportive care.    Hyperkalemia: Suspect secondary to AKI with use of Aldactone and potassium supplementation.  Resolved.   History of left total hip arthroplasty with subsequent infection with  Bacteroides fragilis: Patient was evaluated by infectious disease at [facility] was previously on Flagyl.  Now on IV Invanz (as above)-Flagyl has been discontinued. He has finished all his antibiotics.  Recurrent pleural effusion: Felt to be secondary to hepatic hydrothorax-most recent CT chest on 10/07/2018 shows a small pleural effusion-patient is asymptomatic at this point.  Doubt further work-up required-furthermore he has had a recent extensive work-up including multiple thoracocentesis   Cryptogenic cirrhosis: Prior hepatitis serology done earlier this month was negative-agree with plans outlined in prior discharge summary for outpatient follow-up.  Volume status is stable-he is awake and alert-continue Aldactone  Atrial fibrillation: Rate controlled with Cardizem.Continue Eliquis.  Chronic back and right shoulder pain: Continue Lidoderm patch, Tylenol and as needed narcotics.  Bipolar disorder: Stable-continue Lexapro, Neurontin  Constipation: Bowel regimen, exam and x-ray unremarkable, placed on Movantik on 11/28/2018 continue, he has narcotic bowel.  Right shoulder pain: chornic improves with steroid trials..  OSA: Continue CPAP nightly  Morbid obesity  Acute on chronic debility/deconditioning: Has chronic debility/deconditioning at baseline-this is worsened due to acute illness/AKI/left hip infection.  Transfer to skilled nursing when bed is available.  Medically stable.  Insomnia: Been placed on trazodone will add Ambien for better symptom control.  Still complaining of insomnia.    DVT Prophylaxis: Full dose anticoagulation with Eliquis  Code Status: Full code  Family Communication: No family at bedside.  Disposition Plan: Remain inpatient- SNF on discharge when bed available.  Medically stable.  Unable to transition out of the hospital.  Antimicrobial agents: Anti-infectives (From admission, onward)   Start  Dose/Rate Route Frequency Ordered  Stop   10/15/18 1500  cefTRIAXone (ROCEPHIN) 2 g in sodium chloride 0.9 % 100 mL IVPB  Status:  Discontinued     2 g 200 mL/hr over 30 Minutes Intravenous Every 24 hours 10/15/18 1434 10/15/18 1447   10/15/18 1500  ertapenem (INVANZ) 1,000 mg in sodium chloride 0.9 % 100 mL IVPB     1 g 200 mL/hr over 30 Minutes Intravenous Every 24 hours 10/15/18 1447 11/25/18 1604   10/08/18 0000  doxycycline (VIBRA-TABS) 100 MG tablet  Status:  Discontinued     100 mg Oral Every 12 hours 10/08/18 0921 11/26/18    10/06/18 1000  doxycycline (VIBRA-TABS) tablet 100 mg  Status:  Discontinued     100 mg Oral Every 12 hours 10/06/18 0726 10/11/18 1328   10/03/18 1800  metroNIDAZOLE (FLAGYL) tablet 500 mg  Status:  Discontinued     500 mg Oral Every 8 hours 10/03/18 1054 10/15/18 1447   10/03/18 0700  vancomycin (VANCOCIN) 2,000 mg in sodium chloride 0.9 % 500 mL IVPB  Status:  Discontinued     2,000 mg 250 mL/hr over 120 Minutes Intravenous Every 36 hours 10/01/18 1942 10/06/18 0726   10/02/18 0200  ceFEPIme (MAXIPIME) 2 g in sodium chloride 0.9 % 100 mL IVPB  Status:  Discontinued     2 g 200 mL/hr over 30 Minutes Intravenous Every 8 hours 10/01/18 1942 10/02/18 1008   10/01/18 1830  vancomycin (VANCOCIN) 2,500 mg in sodium chloride 0.9 % 500 mL IVPB     2,500 mg 250 mL/hr over 120 Minutes Intravenous STAT 10/01/18 1825 10/02/18 0126   10/01/18 1800  ceFEPIme (MAXIPIME) 2 g in sodium chloride 0.9 % 100 mL IVPB     2 g 200 mL/hr over 30 Minutes Intravenous  Once 10/01/18 1747 10/01/18 1903   10/01/18 1800  metroNIDAZOLE (FLAGYL) IVPB 500 mg  Status:  Discontinued     500 mg 100 mL/hr over 60 Minutes Intravenous Every 8 hours 10/01/18 1747 10/03/18 1054   10/01/18 1800  vancomycin (VANCOCIN) IVPB 1000 mg/200 mL premix  Status:  Discontinued     1,000 mg 200 mL/hr over 60 Minutes Intravenous  Once 10/01/18 1747 10/01/18 1825      Procedures: None  CONSULTS:  None  Time spent: 15-  minutes  MEDICATIONS: Scheduled Meds: . apixaban  5 mg Oral BID  . Chlorhexidine Gluconate Cloth  6 each Topical Daily  . cycloSPORINE  1 drop Both Eyes BID  . diclofenac sodium  2 g Topical BID  . diltiazem  120 mg Oral Daily  . escitalopram  10 mg Oral Daily  . gabapentin  100 mg Oral TID  . lactulose  30 g Oral BID  . lidocaine  1 patch Transdermal Q24H  . Melatonin  6 mg Oral QHS  . midodrine  10 mg Oral TID WC  . multivitamin with minerals  1 tablet Oral Daily  . naloxegol oxalate  12.5 mg Oral Daily  . pantoprazole  40 mg Oral Daily  . polyethylene glycol  17 g Oral BID  . Ensure Max Protein  11 oz Oral BID  . senna-docusate  2 tablet Oral BID  . spironolactone  50 mg Oral Daily  . thiamine  100 mg Oral Daily   Continuous Infusions: . sodium chloride 10 mL/hr at 11/21/18 1900   PRN Meds:.sodium chloride, acetaminophen, albuterol, alum & mag hydroxide-simeth, diltiazem, guaiFENesin-dextromethorphan, hydrOXYzine, menthol-cetylpyridinium, [DISCONTINUED] ondansetron **OR** ondansetron (ZOFRAN) IV, oxyCODONE, sodium  chloride flush, sodium phosphate, traZODone, zolpidem   PHYSICAL EXAM: Vital signs: Vitals:   11/30/18 1545 11/30/18 2132 12/01/18 0531 12/01/18 0533  BP: 103/70 106/71 106/64   Pulse: (!) 107 87 (!) 108   Resp: 18 18 18    Temp: 97.6 F (36.4 C) 99.3 F (37.4 C) 98.4 F (36.9 C)   TempSrc: Oral Oral Oral   SpO2: 99% 96% 98%   Weight:    120.8 kg  Height:       Filed Weights   11/29/18 0554 11/30/18 0558 12/01/18 0533  Weight: 121.1 kg 122.8 kg 120.8 kg   Body mass index is 40.49 kg/m.   Exam  Awake Alert, Oriented X 3, No new F.N deficits, Normal affect [place].AT,PERRAL Supple Neck,No JVD, No cervical lymphadenopathy appriciated.  Symmetrical Chest wall movement, Good air movement bilaterally, CTAB RRR,No Gallops, Rubs or new Murmurs, No Parasternal Heave +ve B.Sounds, Abd Soft, No tenderness, No organomegaly appriciated, No rebound - guarding or  rigidity. No Cyanosis, Clubbing or edema, No new Rash or bruise  LABORATORY DATA: CBC: Recent Labs  Lab 12/01/18 0416  WBC 9.2  HGB 11.4*  HCT 35.6*  MCV 89.2  PLT 182    Basic Metabolic Panel: Recent Labs  Lab 11/27/18 0348 12/01/18 0416  NA 137 138  K 4.9 4.3  CL 106 103  CO2 24 27  GLUCOSE 113* 108*  BUN 48* 37*  CREATININE 0.96 1.06  CALCIUM 9.5 9.9  MG  --  2.0    GFR: Estimated Creatinine Clearance: 79.7 mL/min (by C-G formula based on SCr of 1.06 mg/dL).  Liver Function Tests: Recent Labs  Lab 12/01/18 0416  AST 16  ALT 25  ALKPHOS 88  BILITOT 0.6  PROT 6.3*  ALBUMIN 2.7*   No results for input(s): LIPASE, AMYLASE in the last 168 hours. No results for input(s): AMMONIA in the last 168 hours.  Coagulation Profile: No results for input(s): INR, PROTIME in the last 168 hours.  Cardiac Enzymes: No results for input(s): CKTOTAL, CKMB, CKMBINDEX, TROPONINI in the last 168 hours.  BNP (last 3 results) No results for input(s): PROBNP in the last 8760 hours.  HbA1C: No results for input(s): HGBA1C in the last 72 hours.  CBG: No results for input(s): GLUCAP in the last 168 hours.  Lipid Profile: No results for input(s): CHOL, HDL, LDLCALC, TRIG, CHOLHDL, LDLDIRECT in the last 72 hours.  Thyroid Function Tests: No results for input(s): TSH, T4TOTAL, FREET4, T3FREE, THYROIDAB in the last 72 hours.  Anemia Panel: No results for input(s): VITAMINB12, FOLATE, FERRITIN, TIBC, IRON, RETICCTPCT in the last 72 hours.  Urine analysis:    Component Value Date/Time   COLORURINE YELLOW 10/01/2018 1822   APPEARANCEUR CLEAR 10/01/2018 1822   APPEARANCEUR Clear 01/18/2018 1539   LABSPEC 1.010 10/01/2018 1822   PHURINE 5.0 10/01/2018 1822   GLUCOSEU NEGATIVE 10/01/2018 1822   HGBUR NEGATIVE 10/01/2018 1822   BILIRUBINUR NEGATIVE 10/01/2018 1822   BILIRUBINUR Negative 01/18/2018 1539   KETONESUR NEGATIVE 10/01/2018 1822   PROTEINUR NEGATIVE 10/01/2018  1822   UROBILINOGEN 0.2 09/06/2012 0213   NITRITE NEGATIVE 10/01/2018 1822   LEUKOCYTESUR NEGATIVE 10/01/2018 1822   LEUKOCYTESUR Negative 01/18/2018 1539    Sepsis Labs: Lactic Acid, Venous    Component Value Date/Time   LATICACIDVEN 1.7 10/01/2018 1833    MICROBIOLOGY: No results found for this or any previous visit (from the past 240 hour(s)).  RADIOLOGY STUDIES/RESULTS: Dg Abd Portable 1v  Result Date: 11/29/2018 CLINICAL DATA:  Constipation EXAM: PORTABLE ABDOMEN - 1 VIEW COMPARISON:  11/03/2018 FINDINGS: Formed stool throughout most colonic segments. No evidence of small bowel obstruction or rectal impaction. Postoperative stomach. Cholecystectomy clips. Bilateral hip arthroplasty. IMPRESSION: Generalized stool correlating with history of constipation. No evidence of obstruction or rectal impaction. Electronically Signed   By: Monte Fantasia M.D.   On: 11/29/2018 07:35   Dg Abd Portable 1v  Result Date: 11/03/2018 CLINICAL DATA:  Constipation EXAM: PORTABLE ABDOMEN - 1 VIEW COMPARISON:  11/01/2018 FINDINGS: There is a non obstructive bowel gas pattern. No supine evidence of free air. No organomegaly or suspicious calcification. No acute bony abnormality. IMPRESSION: No acute findings. Electronically Signed   By: Rolm Baptise M.D.   On: 11/03/2018 11:32   Dg Abd Portable 1v  Result Date: 11/01/2018 CLINICAL DATA:  Constipation EXAM: PORTABLE ABDOMEN - 1 VIEW COMPARISON:  CT 01/09/2015 FINDINGS: Large stool burden throughout the colon. No evidence of bowel obstruction or free air. Prior cholecystectomy. Surgical sutures in the left upper abdomen. IMPRESSION: Large stool burden.  No bowel obstruction or free air. Electronically Signed   By: Rolm Baptise M.D.   On: 11/01/2018 11:03     LOS: 39 days    Signature  Lala Lund M.D on 12/01/2018 at 10:31 AM   -  To page go to [email or web address]

## 2018-12-01 NOTE — Progress Notes (Signed)
Physical Therapy Treatment Patient Details Name: [name] MRN: [id number] DOB: 28-Jan-1946 Today's Date: 12/01/2018    History of Present Illness 73 y.o. male admitted 10/01/18 with generalized weakness, falls and SOB. Workup revealed afib with recurrent R-side pleural effusion; AKI. Pt with L hip cellulitis with chronic L hip prosthetic joint infection; s/p joint aspiration 2/14. PMH includes HTN, afib on Eliquis, OSA, bipolar disorder, obesity, L THA with infection and multiple prolonged hospitalizations.    PT Comments    Pt performed progression from bed to [facility] to recliner.  He was able to progress steps to [facility] but presented with dizziness on commode 78/58.  After low BP removed patient from commode and assisted patient to recliner where he was placed in a supine position and BP returned to 104/68.  Pt continues to be symptomatic to orthostatic changes.  Plan next session for continued transfer training with progression to gait training as able.      Follow Up Recommendations  SNF     Equipment Recommendations  Wheelchair (measurements PT);Wheelchair cushion (measurements PT);Hospital bed    Recommendations for Other Services       Precautions / Restrictions Precautions Precautions: Fall;Posterior Hip;Other (comment)(+ orthostatic hypotension) Precaution Booklet Issued: No Precaution Comments: h/o falls, very weak, per pt report posterior hip precautions. Restrictions Weight Bearing Restrictions: No    Mobility  Bed Mobility Overal bed mobility: Needs Assistance Bed Mobility: Supine to Sit;Sit to Supine Rolling: Mod assist   Supine to sit: Mod assist     General bed mobility comments: Pt performed rolling to L side to remove bed pan.  Pt followed commands for rolling with cues for hand and foot placement.  Pt able to come to sitting with mod assistance +1.  Pt continues to require assistance for LE advancement and trunk elevation.    Transfers Overall transfer level:  Needs assistance Equipment used: Rolling walker (2 wheeled) Transfers: Sit to/from Stand Sit to Stand: Mod assist;+2 physical assistance Stand pivot transfers: Mod assist;+2 physical assistance       General transfer comment: Pt performed sit to stand to [facility] to complete BM.  In sitting on commode he reports he cannot have a BM and startes to feel dizzy.  BP 78/58.  Pt stood from [facility] and BSC moved and chair briught up to patient due to dizziness.  Pt reclined in chair and BP returned to 104/68.    Ambulation/Gait Ambulation/Gait assistance: Mod assist;+2 physical assistance Gait Distance (Feet): 4 Feet(steps from bed to bedside commode.  ) Assistive device: Rolling walker (2 wheeled) Gait Pattern/deviations: Wide base of support;Trunk flexed;Decreased stride length;Step-to pattern Gait velocity: decreased   General Gait Details: unable this date   Stairs             Wheelchair Mobility    Modified Rankin (Stroke Patients Only)       Balance     Sitting balance-[name]: Poor Sitting balance - Comments: reliant on UE assist     Standing balance-[name]: Poor                              Cognition Arousal/Alertness: Awake/alert Behavior During Therapy: Anxious Overall Cognitive Status: Within Functional Limits for tasks assessed                                 General Comments: Pt premedicated with anxiety meds  pre session.  He remains highly anxious throughout session.        Exercises      General Comments        Pertinent Vitals/Pain Pain Assessment: Faces Pain Score: 6  Pain Location: back, buttocks Pain Descriptors / Indicators: Sharp Pain Intervention(s): Monitored during session;Repositioned    Home Living                      Prior Function            PT Goals (current goals can now be found in the care plan section) Acute Rehab PT Goals Patient Stated Goal: To not live like this and get back to  doing what he wants to do Potential to Achieve Goals: Fair Progress towards PT goals: Progressing toward goals    Frequency    Min 3X/week      PT Plan Current plan remains appropriate    Co-evaluation              AM-PAC PT "6 Clicks" Mobility   Outcome Measure  Help needed turning from your back to your side while in a flat bed without using bedrails?: A Lot Help needed moving from lying on your back to sitting on the side of a flat bed without using bedrails?: A Lot Help needed moving to and from a bed to a chair (including a wheelchair)?: A Lot Help needed standing up from a chair using your arms (e.g., wheelchair or bedside chair)?: A Lot Help needed to walk in hospital room?: A Lot Help needed climbing 3-5 steps with a railing? : Total 6 Click Score: 11    End of Session Equipment Utilized During Treatment: Gait belt Activity Tolerance: Patient limited by pain Patient left: in bed;with call bell/phone within reach;with nursing/sitter in room Nurse Communication: Mobility status PT Visit Diagnosis: Other abnormalities of gait and mobility (R26.89);Muscle weakness (generalized) (M62.81);Difficulty in walking, not elsewhere classified (R26.2);History of falling (Z91.81)     Time: 2395-3202 PT Time Calculation (min) (ACUTE ONLY): 30 min  Charges:  $Gait Training: 8-22 mins $Therapeutic Activity: 8-22 mins                     Governor Rooks, PTA Acute Rehabilitation Services Pager [phone] Office [phone]     Gaege Sangalang Eli Hose 12/01/2018, 4:19 PM

## 2018-12-02 MED ORDER — LACTATED RINGERS IV SOLN: INTRAVENOUS | Status: AC

## 2018-12-02 NOTE — Progress Notes (Signed)
Orthopedic Tech Progress Note Patient Details:  [name] 1946/03/05 [id number]  Ortho Devices Type of Ortho Device: Arm sling Ortho Device/Splint Location: Right Arm Ortho Device/Splint Interventions: Ordered, Application, Adjustment   Post Interventions Patient Tolerated: Well Instructions Provided: Adjustment of device, Care of device   [name] J Tanyah Debruyne 12/02/2018, 6:16 PM

## 2018-12-02 NOTE — Progress Notes (Signed)
Pt c/o pressure like pain in L eye. Pt rated pain 10/10. Pt stated "it feels like fluid is building up in my eye and it is going to pop out". Pt denies blurriness or difficulty seeing. Applied a cold compress and administered pain med. Will notify day shift RN in hand off. NP Baltazar Najjar made aware. Will continue to monitor and treat per MD orders.

## 2018-12-02 NOTE — Progress Notes (Signed)
PROGRESS NOTE        PATIENT DETAILS Name: [name] Age: 73 y.o. Sex: male Date of Birth: 1946/07/30 Admit Date: 10/01/2018 Admitting Physician Vianne Bulls, MD OVZ:CHYIFO, Hollice Espy, MD  Brief Narrative: Patient is a 73 y.o. male with history of A. 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  Found to have a soft tissue infection of the left hip area, acute kidney injury and hyperkalemia.  ID, orthopedics consulted-underwent left hip aspiration on 2/14, infectious disease recommending 6 weeks of IV Invanz. Finished 3/26.   Subjective:  Patient in bed, appears comfortable, denies any headache, no fever, no chest pain or pressure, no shortness of breath , no abdominal pain. No focal weakness. Chr. R shoulder pain.   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  Was on suppressive Flagyl for Bacteroides fragilis infection-recommendations from ID are for 6 weeks of IV ertapenem-end date of November 25, 2018. He has finished all his antibiotics while in the hospital.  AKI: Likely hemodynamically mediated-secondary to soft tissue infection of the left hip, diuretic use.  Resolved with supportive care.    Chronic hypotension.  Intermittent IV fluid, Midodrine TID scheduled.  Asymptomatic.    History of left total hip arthroplasty with subsequent infection  with Bacteroides fragilis: Patient was evaluated by infectious disease at [facility] was previously on Flagyl.  Now on IV Invanz (as above)-Flagyl has been discontinued. He has finished all his antibiotics.  Recurrent pleural effusion: Felt to be secondary to hepatic hydrothorax-most recent CT chest on 10/07/2018 shows a small pleural effusion-patient is asymptomatic at this point.  Doubt further work-up required-furthermore he has had a recent extensive work-up including multiple thoracocentesis   Cryptogenic cirrhosis: Prior hepatitis serology done earlier this month was negative-agree with plans outlined in prior discharge summary for outpatient follow-up.  Volume status is stable-he is awake and alert-continue Aldactone  Atrial fibrillation: Rate controlled with Cardizem.Continue Eliquis.  Chronic back and right shoulder pain: Continue Lidoderm patch, Tylenol and as needed narcotics.  Bipolar disorder: Stable-continue Lexapro, Neurontin  Constipation: Bowel regimen, exam and x-ray unremarkable, placed on Movantik on 11/28/2018 continue, he has narcotic bowel.  Right shoulder pain: chornic improves with steroid trials..  OSA: Continue CPAP nightly  Morbid obesity  Acute on chronic debility/deconditioning: Has chronic debility/deconditioning at baseline-this is worsened due to acute illness/AKI/left hip infection.  Transfer to skilled nursing when bed is available.  Medically stable.  Insomnia: Been placed on trazodone will add Ambien for better symptom control.  Still complaining of insomnia.    DVT Prophylaxis: Full dose anticoagulation with Eliquis  Code Status: Full code  Family Communication: No family at bedside.  Disposition Plan: Remain inpatient- SNF on discharge when bed available.  Medically stable.  Unable to transition out of the hospital.  Antimicrobial agents: Anti-infectives (From admission, onward)   Start  Dose/Rate Route Frequency  Ordered Stop   10/15/18 1500  cefTRIAXone (ROCEPHIN) 2 g in sodium chloride 0.9 % 100 mL IVPB  Status:  Discontinued     2 g 200 mL/hr over 30 Minutes Intravenous Every 24 hours 10/15/18 1434 10/15/18 1447   10/15/18 1500  ertapenem (INVANZ) 1,000 mg in sodium chloride 0.9 % 100 mL IVPB     1 g 200 mL/hr over 30 Minutes Intravenous Every 24 hours 10/15/18 1447 11/25/18 1604   10/08/18 0000  doxycycline (VIBRA-TABS) 100 MG tablet  Status:  Discontinued     100 mg Oral Every 12 hours 10/08/18 0921 11/26/18    10/06/18 1000  doxycycline (VIBRA-TABS) tablet 100 mg  Status:  Discontinued     100 mg Oral Every 12 hours 10/06/18 0726 10/11/18 1328   10/03/18 1800  metroNIDAZOLE (FLAGYL) tablet 500 mg  Status:  Discontinued     500 mg Oral Every 8 hours 10/03/18 1054 10/15/18 1447   10/03/18 0700  vancomycin (VANCOCIN) 2,000 mg in sodium chloride 0.9 % 500 mL IVPB  Status:  Discontinued     2,000 mg 250 mL/hr over 120 Minutes Intravenous Every 36 hours 10/01/18 1942 10/06/18 0726   10/02/18 0200  ceFEPIme (MAXIPIME) 2 g in sodium chloride 0.9 % 100 mL IVPB  Status:  Discontinued     2 g 200 mL/hr over 30 Minutes Intravenous Every 8 hours 10/01/18 1942 10/02/18 1008   10/01/18 1830  vancomycin (VANCOCIN) 2,500 mg in sodium chloride 0.9 % 500 mL IVPB     2,500 mg 250 mL/hr over 120 Minutes Intravenous STAT 10/01/18 1825 10/02/18 0126   10/01/18 1800  ceFEPIme (MAXIPIME) 2 g in sodium chloride 0.9 % 100 mL IVPB     2 g 200 mL/hr over 30 Minutes Intravenous  Once 10/01/18 1747 10/01/18 1903   10/01/18 1800  metroNIDAZOLE (FLAGYL) IVPB 500 mg  Status:  Discontinued     500 mg 100 mL/hr over 60 Minutes Intravenous Every 8 hours 10/01/18 1747 10/03/18 1054   10/01/18 1800  vancomycin (VANCOCIN) IVPB 1000 mg/200 mL premix  Status:  Discontinued     1,000 mg 200 mL/hr over 60 Minutes Intravenous  Once 10/01/18 1747 10/01/18 1825      Procedures: None  CONSULTS:  None  Time spent: 15-  minutes  MEDICATIONS: Scheduled Meds: . apixaban  5 mg Oral BID  . Chlorhexidine Gluconate Cloth  6 each Topical Daily  . cycloSPORINE  1 drop Both Eyes BID  . diclofenac sodium  2 g Topical BID  . diltiazem  120 mg Oral Daily  . escitalopram  10 mg Oral Daily  . gabapentin  100 mg Oral TID  . lactulose  30 g Oral BID  . lidocaine  1 patch Transdermal Q24H  . Melatonin  6 mg Oral QHS  . midodrine  10 mg Oral TID WC  . multivitamin with minerals  1 tablet Oral Daily  . naloxegol oxalate  12.5 mg Oral Daily  . pantoprazole  40 mg Oral Daily  . polyethylene glycol  17 g Oral BID  . Ensure Max Protein  11 oz Oral BID  . senna-docusate  2 tablet Oral BID  . spironolactone  50 mg Oral Daily  . thiamine  100 mg Oral Daily   Continuous Infusions: . sodium chloride 10 mL/hr at 11/21/18 1900  . lactated ringers     PRN Meds:.sodium chloride, acetaminophen, albuterol, alum & mag hydroxide-simeth, diltiazem, guaiFENesin-dextromethorphan, hydrOXYzine, menthol-cetylpyridinium, [DISCONTINUED] ondansetron **  OR** ondansetron (ZOFRAN) IV, oxyCODONE, sodium chloride flush, sodium phosphate, traZODone, zolpidem   PHYSICAL EXAM: Vital signs: Vitals:   12/01/18 0531 12/01/18 0533 12/01/18 2158 12/02/18 0554  BP: 106/64  110/65 113/88  Pulse: (!) 108  96 99  Resp: 18  18 19   Temp: 98.4 F (36.9 C)  98.7 F (37.1 C) 97.6 F (36.4 C)  TempSrc: Oral  Oral Oral  SpO2: 98%  97% 99%  Weight:  120.8 kg  119.3 kg  Height:       Filed Weights   11/30/18 0558 12/01/18 0533 12/02/18 0554  Weight: 122.8 kg 120.8 kg 119.3 kg   Body mass index is 39.99 kg/m.   Exam  Awake Alert, Oriented X 3, No new F.N deficits, Normal affect [place].AT,PERRAL Supple Neck,No JVD, No cervical lymphadenopathy appriciated.  Symmetrical Chest wall movement, Good air movement bilaterally, CTAB RRR,No Gallops, Rubs or new Murmurs, No Parasternal Heave +ve B.Sounds, Abd Soft, No tenderness, No organomegaly appriciated,  No rebound - guarding or rigidity. No Cyanosis, Clubbing or edema, No new Rash or bruise  LABORATORY DATA: CBC: Recent Labs  Lab 12/01/18 0416  WBC 9.2  HGB 11.4*  HCT 35.6*  MCV 89.2  PLT 712    Basic Metabolic Panel: Recent Labs  Lab 11/27/18 0348 12/01/18 0416  NA 137 138  K 4.9 4.3  CL 106 103  CO2 24 27  GLUCOSE 113* 108*  BUN 48* 37*  CREATININE 0.96 1.06  CALCIUM 9.5 9.9  MG  --  2.0    GFR: Estimated Creatinine Clearance: 79.1 mL/min (by C-G formula based on SCr of 1.06 mg/dL).  Liver Function Tests: Recent Labs  Lab 12/01/18 0416  AST 16  ALT 25  ALKPHOS 88  BILITOT 0.6  PROT 6.3*  ALBUMIN 2.7*   No results for input(s): LIPASE, AMYLASE in the last 168 hours. No results for input(s): AMMONIA in the last 168 hours.  Coagulation Profile: No results for input(s): INR, PROTIME in the last 168 hours.  Cardiac Enzymes: No results for input(s): CKTOTAL, CKMB, CKMBINDEX, TROPONINI in the last 168 hours.  BNP (last 3 results) No results for input(s): PROBNP in the last 8760 hours.  HbA1C: No results for input(s): HGBA1C in the last 72 hours.  CBG: No results for input(s): GLUCAP in the last 168 hours.  Lipid Profile: No results for input(s): CHOL, HDL, LDLCALC, TRIG, CHOLHDL, LDLDIRECT in the last 72 hours.  Thyroid Function Tests: No results for input(s): TSH, T4TOTAL, FREET4, T3FREE, THYROIDAB in the last 72 hours.  Anemia Panel: No results for input(s): VITAMINB12, FOLATE, FERRITIN, TIBC, IRON, RETICCTPCT in the last 72 hours.  Urine analysis:    Component Value Date/Time   COLORURINE YELLOW 10/01/2018 1822   APPEARANCEUR CLEAR 10/01/2018 1822   APPEARANCEUR Clear 01/18/2018 1539   LABSPEC 1.010 10/01/2018 1822   PHURINE 5.0 10/01/2018 1822   GLUCOSEU NEGATIVE 10/01/2018 1822   HGBUR NEGATIVE 10/01/2018 1822   BILIRUBINUR NEGATIVE 10/01/2018 1822   BILIRUBINUR Negative 01/18/2018 1539   KETONESUR NEGATIVE 10/01/2018 1822    PROTEINUR NEGATIVE 10/01/2018 1822   UROBILINOGEN 0.2 09/06/2012 0213   NITRITE NEGATIVE 10/01/2018 1822   LEUKOCYTESUR NEGATIVE 10/01/2018 1822   LEUKOCYTESUR Negative 01/18/2018 1539    Sepsis Labs: Lactic Acid, Venous    Component Value Date/Time   LATICACIDVEN 1.7 10/01/2018 1833    MICROBIOLOGY: No results found for this or any previous visit (from the past 240 hour(s)).  RADIOLOGY STUDIES/RESULTS: Dg Abd Portable 1v  Result Date: 11/29/2018 CLINICAL DATA:  Constipation EXAM: PORTABLE ABDOMEN - 1 VIEW COMPARISON:  11/03/2018 FINDINGS: Formed stool throughout most colonic segments. No evidence of small bowel obstruction or rectal impaction. Postoperative stomach. Cholecystectomy clips. Bilateral hip arthroplasty. IMPRESSION: Generalized stool correlating with history of constipation. No evidence of obstruction or rectal impaction. Electronically Signed   By: Monte Fantasia M.D.   On: 11/29/2018 07:35   Dg Abd Portable 1v  Result Date: 11/03/2018 CLINICAL DATA:  Constipation EXAM: PORTABLE ABDOMEN - 1 VIEW COMPARISON:  11/01/2018 FINDINGS: There is a non obstructive bowel gas pattern. No supine evidence of free air. No organomegaly or suspicious calcification. No acute bony abnormality. IMPRESSION: No acute findings. Electronically Signed   By: Rolm Baptise M.D.   On: 11/03/2018 11:32     LOS: 59 days    Signature  Lala Lund M.D on 12/02/2018 at 9:19 AM   -  To page go to [email or web address]

## 2018-12-02 NOTE — Progress Notes (Signed)
Physical Therapy Treatment Patient Details Name: [name] MRN: [id number] DOB: 07/31/1946 Today's Date: 12/02/2018    History of Present Illness 73 y.o. male admitted 10/01/18 with generalized weakness, falls and SOB. Workup revealed afib with recurrent R-side pleural effusion; AKI. Pt with L hip cellulitis with chronic L hip prosthetic joint infection; s/p joint aspiration 2/14. PMH includes HTN, afib on Eliquis, OSA, bipolar disorder, obesity, L THA with infection and multiple prolonged hospitalizations.    PT Comments    Pt performed short bout of gt training with close chair follow. He is less anxious during session and able to progress minimally. He continues to complain of dizziness.  Bp in supine 105/74 and BP in sitting 87/55.  Pt continues to benefit from short term SNF placement to improve strength and function before returning home.     Follow Up Recommendations  SNF     Equipment Recommendations  Wheelchair (measurements PT);Wheelchair cushion (measurements PT);Hospital bed    Recommendations for Other Services       Precautions / Restrictions Precautions Precautions: Fall;Posterior Hip;Other (comment)(+ orthostatic hypotension) Precaution Comments: h/o falls, very weak, per pt report posterior hip precautions. Restrictions Weight Bearing Restrictions: No    Mobility  Bed Mobility Overal bed mobility: Needs Assistance Bed Mobility: Supine to Sit     Supine to sit: Mod assist     General bed mobility comments: Pt performed LE advancement and remains to require assistance to elevate trunk into sitting.  He reports dizziness in sitting.    Transfers Overall transfer level: Needs assistance Equipment used: Rolling walker (2 wheeled) Transfers: Sit to/from Stand Sit to Stand: Mod assist;+2 physical assistance;From elevated surface         General transfer comment: Cues for hand placement and mod +2 to boost  into standing.    Ambulation/Gait Ambulation/Gait assistance: Mod assist;+2 physical assistance;+2 safety/equipment Gait Distance (Feet): 6 Feet Assistive device: Rolling walker (2 wheeled) Gait Pattern/deviations: Step-to pattern;Decreased step length - left;Decreased step length - right;Decreased stride length;Decreased dorsiflexion - right;Decreased dorsiflexion - left;Shuffle;Trunk flexed     General Gait Details: Pt with slow short shuffling pattern.  Pt became anxious and requested to sit.  DOE noted.     Stairs             Wheelchair Mobility    Modified Rankin (Stroke Patients Only)       Balance Overall balance assessment: Needs assistance   Sitting balance-[name]: Fair Sitting balance - Comments: reliant on UE assist     Standing balance-[name]: Poor                              Cognition Arousal/Alertness: Awake/alert Behavior During Therapy: WFL for tasks assessed/performed(less anxious today)                                   General Comments: Pt appears calm and responded better to tx.        Exercises      General Comments        Pertinent Vitals/Pain Pain Assessment: Faces Faces Pain Scale: Hurts little more Pain Location: back, buttocks Pain Descriptors / Indicators: Sharp Pain Intervention(s): Monitored during session;Repositioned    Home Living                      Prior Function  PT Goals (current goals can now be found in the care plan section) Acute Rehab PT Goals Patient Stated Goal: To not live like this and get back to doing what he wants to do Potential to Achieve Goals: Fair Progress towards PT goals: Progressing toward goals    Frequency    Min 3X/week      PT Plan Current plan remains appropriate    Co-evaluation              AM-PAC PT "6 Clicks" Mobility   Outcome Measure  Help needed turning from your back to your side while in a flat bed without using  bedrails?: A Lot Help needed moving from lying on your back to sitting on the side of a flat bed without using bedrails?: A Lot Help needed moving to and from a bed to a chair (including a wheelchair)?: A Lot Help needed standing up from a chair using your arms (e.g., wheelchair or bedside chair)?: A Lot Help needed to walk in hospital room?: A Lot Help needed climbing 3-5 steps with a railing? : Total 6 Click Score: 11    End of Session Equipment Utilized During Treatment: Gait belt Activity Tolerance: Patient limited by pain Patient left: in bed;with call bell/phone within reach;with nursing/sitter in room Nurse Communication: Mobility status PT Visit Diagnosis: Other abnormalities of gait and mobility (R26.89);Muscle weakness (generalized) (M62.81);Difficulty in walking, not elsewhere classified (R26.2);History of falling (Z91.81)     Time: 0973-5329 PT Time Calculation (min) (ACUTE ONLY): 21 min  Charges:  $Therapeutic Activity: 8-22 mins                     Governor Rooks, PTA Acute Rehabilitation Services Pager [phone] Office [phone]     Lakara Weiland Eli Hose 12/02/2018, 6:15 PM

## 2018-12-02 NOTE — Progress Notes (Signed)
RT NOTE:  Pt has home CPAP at bedside. Pt manages machine.

## 2018-12-02 NOTE — Progress Notes (Signed)
Nutrition Follow-up   RD working remotely  [place]:   Morbid obesity  INTERVENTION:    Ensure Max po BID, each supplement provides 150 kcal and 30 grams of protein   RD to sign off, please consult as needed  NUTRITION DIAGNOSIS:   Increased nutrient needs related to chronic illness (cirrhosis) as evidenced by estimated needs, ongoing  GOAL:   Patient will meet greater than or equal to 90% of their needs, met  MONITOR:   PO intake, Supplement acceptance, Labs, Weight trends, Skin, I & O's  ASSESSMENT:   73 y.o. male with medical history significant for chronic back and right shoulder pain, OSA on CPAP, paroxysmal atrial fibrillation on Eliquis, hepatic cirrhosis, recurrent pleural effusions, and status post left total hip arthroplasty complicated by infection   PO intake excellent at 100% per flowsheet records. Drinking his Ensure Max supplements BID.  Labs & medications reviewed.  Pt is medically stable; awaiting SNF placement.  Diet Order:   Diet Order            Diet - low sodium heart healthy        Diet 2 gram sodium Room service appropriate? Yes; Fluid consistency: Thin  Diet effective now        Diet - low sodium heart healthy             EDUCATION NEEDS:   Education needs have been addressed  Skin:  Skin Assessment: Skin Integrity Issues: Incisions: L hip Other: MASD bilateral on buttocks  Last BM:  4/2  Height:   Ht Readings from Last 1 Encounters:  10/01/18 5' 8"  (1.727 m)   Weight:   Wt Readings from Last 1 Encounters:  12/02/18 119.3 kg   Ideal Body Weight:  70 kg  BMI:  Body mass index is 39.99 kg/m.  Estimated Nutritional Needs:   Kcal:  2250-2450  Protein:  125-140 grams  Fluid:  2.2-2.4 L  [name], RD, LDN Pager #: [phone] After-Hours Pager #: [phone]

## 2018-12-02 NOTE — Care Management Important Message (Signed)
Important Message  Patient Details  Name: [name] MRN: [id number] Date of Birth: 02-19-1946   Medicare Important Message Given:  Yes    [name] 12/02/2018, 3:30 PM

## 2018-12-03 ENCOUNTER — Inpatient Hospital Stay (HOSPITAL_COMMUNITY): Payer: Medicare Other

## 2018-12-03 MED ORDER — OXYCODONE HCL 5 MG PO TABS
5.0000 mg | ORAL_TABLET | Freq: Once | ORAL | Status: AC
  Administered 2018-12-03: 5 mg via ORAL
  Filled 2018-12-03: qty 1

## 2018-12-03 MED ORDER — OXYCODONE HCL 5 MG PO TABS
5.0000 mg | ORAL_TABLET | Freq: Three times a day (TID) | ORAL | Status: DC | PRN
  Administered 2018-12-03 (×2): 5 mg via ORAL
  Filled 2018-12-03: qty 1

## 2018-12-03 MED ORDER — OLOPATADINE HCL 0.1 % OP SOLN
1.0000 [drp] | Freq: Two times a day (BID) | OPHTHALMIC | Status: DC
  Administered 2018-12-03 – 2018-12-07 (×9): 1 [drp] via OPHTHALMIC
  Filled 2018-12-03: qty 5

## 2018-12-03 NOTE — Progress Notes (Signed)
PROGRESS NOTE        PATIENT DETAILS Name: [name] Age: 73 y.o. Sex: male Date of Birth: 08-Aug-1946 Admit Date: 10/01/2018 Admitting Physician Vianne Bulls, MD [id number], Hollice Espy, MD  Brief Narrative: Patient is a 73 y.o. male with history of A. 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  Found to have a soft tissue infection of the left hip area, acute kidney injury and hyperkalemia.  ID, orthopedics consulted-underwent left hip aspiration on 2/14, infectious disease recommending 6 weeks of IV Invanz. Finished 3/26.   Subjective:  Patient in bed, appears comfortable, denies any headache, no fever, no chest pain or pressure, no shortness of breath , no abdominal pain. No focal weakness.  Assessment right shoulder pain acute on chronic along with now itching and discomfort in both eyes face ongoing for 3 days but forgot to tell anyone.   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  Was on suppressive Flagyl for Bacteroides fragilis infection-recommendations from ID are for 6 weeks of IV ertapenem-end date of November 25, 2018. He has finished all his antibiotics while in the hospital.  AKI: Likely hemodynamically mediated-secondary to soft tissue infection of the left hip, diuretic use.  Resolved with supportive care.    Chronic hypotension.   Intermittent IV fluid, Midodrine TID scheduled.  Asymptomatic.    History of left total hip arthroplasty with subsequent infection with Bacteroides fragilis: Patient was evaluated by infectious disease at [facility] was previously on Flagyl.  Now on IV Invanz (as above)-Flagyl has been discontinued. He has finished all his antibiotics.  Recurrent pleural effusion: Felt to be secondary to hepatic hydrothorax-most recent CT chest on 10/07/2018 shows a small pleural effusion-patient is asymptomatic at this point.  Doubt further work-up required-furthermore he has had a recent extensive work-up including multiple thoracocentesis   Cryptogenic cirrhosis: Prior hepatitis serology done earlier this month was negative-agree with plans outlined in prior discharge summary for outpatient follow-up.  Volume status is stable-he is awake and alert-continue Aldactone  Atrial fibrillation: Rate controlled with Cardizem.Continue Eliquis.  Chronic back and right shoulder pain: Continue Lidoderm patch, Tylenol and as needed narcotics.  Repeat x-ray on 12/03/2018 portable as he said he pulled his right shoulder muscle last night while trying to get out of the bed.  Bilateral itching in eyes with some thin crusty discharge and some discomfort.  No photophobia, no vision disturbance, no headache, likely allergy, placed on Patanol drops will monitor.   Bipolar disorder: Stable-continue Lexapro, Neurontin  Constipation: Bowel regimen, exam and x-ray unremarkable, placed on Movantik on 11/28/2018 continue, he has narcotic bowel.  Right shoulder pain: chornic improves with steroid trials..  OSA: Continue CPAP nightly  Morbid obesity  Acute on chronic debility/deconditioning: Has chronic debility/deconditioning at baseline-this is worsened due to acute illness/AKI/left hip infection.  Transfer to skilled nursing when  bed is available.  Medically stable.  Insomnia: Been placed on trazodone will add  Ambien for better symptom control.  Still complaining of insomnia.    DVT Prophylaxis: Full dose anticoagulation with Eliquis  Code Status: Full code  Family Communication: No family at bedside.  Disposition Plan: Remain inpatient- SNF on discharge when bed available.  Medically stable.  Unable to transition out of the hospital.  Antimicrobial agents: Anti-infectives (From admission, onward)   Start     Dose/Rate Route Frequency Ordered Stop   10/15/18 1500  cefTRIAXone (ROCEPHIN) 2 g in sodium chloride 0.9 % 100 mL IVPB  Status:  Discontinued     2 g 200 mL/hr over 30 Minutes Intravenous Every 24 hours 10/15/18 1434 10/15/18 1447   10/15/18 1500  ertapenem (INVANZ) 1,000 mg in sodium chloride 0.9 % 100 mL IVPB     1 g 200 mL/hr over 30 Minutes Intravenous Every 24 hours 10/15/18 1447 11/25/18 1604   10/08/18 0000  doxycycline (VIBRA-TABS) 100 MG tablet  Status:  Discontinued     100 mg Oral Every 12 hours 10/08/18 0921 11/26/18    10/06/18 1000  doxycycline (VIBRA-TABS) tablet 100 mg  Status:  Discontinued     100 mg Oral Every 12 hours 10/06/18 0726 10/11/18 1328   10/03/18 1800  metroNIDAZOLE (FLAGYL) tablet 500 mg  Status:  Discontinued     500 mg Oral Every 8 hours 10/03/18 1054 10/15/18 1447   10/03/18 0700  vancomycin (VANCOCIN) 2,000 mg in sodium chloride 0.9 % 500 mL IVPB  Status:  Discontinued     2,000 mg 250 mL/hr over 120 Minutes Intravenous Every 36 hours 10/01/18 1942 10/06/18 0726   10/02/18 0200  ceFEPIme (MAXIPIME) 2 g in sodium chloride 0.9 % 100 mL IVPB  Status:  Discontinued     2 g 200 mL/hr over 30 Minutes Intravenous Every 8 hours 10/01/18 1942 10/02/18 1008   10/01/18 1830  vancomycin (VANCOCIN) 2,500 mg in sodium chloride 0.9 % 500 mL IVPB     2,500 mg 250 mL/hr over 120 Minutes Intravenous STAT 10/01/18 1825 10/02/18 0126   10/01/18 1800  ceFEPIme (MAXIPIME) 2 g in sodium chloride 0.9 % 100 mL IVPB     2 g 200 mL/hr over 30 Minutes Intravenous   Once 10/01/18 1747 10/01/18 1903   10/01/18 1800  metroNIDAZOLE (FLAGYL) IVPB 500 mg  Status:  Discontinued     500 mg 100 mL/hr over 60 Minutes Intravenous Every 8 hours 10/01/18 1747 10/03/18 1054   10/01/18 1800  vancomycin (VANCOCIN) IVPB 1000 mg/200 mL premix  Status:  Discontinued     1,000 mg 200 mL/hr over 60 Minutes Intravenous  Once 10/01/18 1747 10/01/18 1825      Procedures: None  CONSULTS:  None  Time spent: 15- minutes  MEDICATIONS: Scheduled Meds: . apixaban  5 mg Oral BID  . Chlorhexidine Gluconate Cloth  6 each Topical Daily  . cycloSPORINE  1 drop Both Eyes BID  . diclofenac sodium  2 g Topical BID  . diltiazem  120 mg Oral Daily  . escitalopram  10 mg Oral Daily  . gabapentin  100 mg Oral TID  . lactulose  30 g Oral BID  . lidocaine  1 patch Transdermal Q24H  . Melatonin  6 mg Oral QHS  . midodrine  10 mg Oral TID WC  . multivitamin with minerals  1 tablet Oral Daily  . olopatadine  1 drop Both Eyes BID  . pantoprazole  40 mg Oral Daily  . polyethylene glycol  17 g Oral BID  . Ensure Max Protein  11 oz Oral BID  . senna-docusate  2 tablet Oral BID  . spironolactone  50 mg Oral Daily  . thiamine  100 mg Oral Daily   Continuous Infusions: . sodium chloride 10 mL/hr at 11/21/18 1900   PRN Meds:.sodium chloride, acetaminophen, albuterol, alum & mag hydroxide-simeth, diltiazem, guaiFENesin-dextromethorphan, hydrOXYzine, menthol-cetylpyridinium, [DISCONTINUED] ondansetron **OR** ondansetron (ZOFRAN) IV, oxyCODONE, sodium chloride flush, sodium phosphate, traZODone, zolpidem   PHYSICAL EXAM: Vital signs: Vitals:   12/02/18 1013 12/02/18 1401 12/02/18 2224 12/03/18 0644  BP: 101/73 97/67 98/70    Pulse:  97 94   Resp:  18 16   Temp:  98.5 F (36.9 C) 98.3 F (36.8 C)   TempSrc:  Oral Oral   SpO2:  98% 96%   Weight:    123.4 kg  Height:       Filed Weights   12/01/18 0533 12/02/18 0554 12/03/18 0644  Weight: 120.8 kg 119.3 kg 123.4 kg   Body  mass index is 41.36 kg/m.   Exam  Awake Alert, Oriented X 3, No new F.N deficits, Normal affect [place].AT, mild conjunctival redness in both eyes with some thin crusty material on the sides Supple Neck,No JVD, No cervical lymphadenopathy appriciated.  Symmetrical Chest wall movement, Good air movement bilaterally, CTAB RRR,No Gallops, Rubs or new Murmurs, No Parasternal Heave +ve B.Sounds, Abd Soft, No tenderness, No organomegaly appriciated, No rebound - guarding or rigidity. No Cyanosis, Clubbing or edema, No new Rash or bruise  LABORATORY DATA: CBC: Recent Labs  Lab 12/01/18 0416  WBC 9.2  HGB 11.4*  HCT 35.6*  MCV 89.2  PLT 341    Basic Metabolic Panel: Recent Labs  Lab 11/27/18 0348 12/01/18 0416  NA 137 138  K 4.9 4.3  CL 106 103  CO2 24 27  GLUCOSE 113* 108*  BUN 48* 37*  CREATININE 0.96 1.06  CALCIUM 9.5 9.9  MG  --  2.0    GFR: Estimated Creatinine Clearance: 80.5 mL/min (by C-G formula based on SCr of 1.06 mg/dL).  Liver Function Tests: Recent Labs  Lab 12/01/18 0416  AST 16  ALT 25  ALKPHOS 88  BILITOT 0.6  PROT 6.3*  ALBUMIN 2.7*   No results for input(s): LIPASE, AMYLASE in the last 168 hours. No results for input(s): AMMONIA in the last 168 hours.  Coagulation Profile: No results for input(s): INR, PROTIME in the last 168 hours.  Cardiac Enzymes: No results for input(s): CKTOTAL, CKMB, CKMBINDEX, TROPONINI in the last 168 hours.  BNP (last 3 results) No results for input(s): PROBNP in the last 8760 hours.  HbA1C: No results for input(s): HGBA1C in the last 72 hours.  CBG: No results for input(s): GLUCAP in the last 168 hours.  Lipid Profile: No results for input(s): CHOL, HDL, LDLCALC, TRIG, CHOLHDL, LDLDIRECT in the last 72 hours.  Thyroid Function Tests: No results for input(s): TSH, T4TOTAL, FREET4, T3FREE, THYROIDAB in the last 72 hours.  Anemia Panel: No results for input(s): VITAMINB12, FOLATE, FERRITIN, TIBC, IRON,  RETICCTPCT in the last 72 hours.  Urine analysis:    Component Value Date/Time   COLORURINE YELLOW 10/01/2018 1822   APPEARANCEUR CLEAR 10/01/2018 1822   APPEARANCEUR Clear 01/18/2018 1539   LABSPEC 1.010 10/01/2018 1822   PHURINE 5.0 10/01/2018 1822   GLUCOSEU NEGATIVE 10/01/2018 1822   HGBUR NEGATIVE 10/01/2018 1822   BILIRUBINUR NEGATIVE 10/01/2018 1822   BILIRUBINUR  Negative 01/18/2018 1539   KETONESUR NEGATIVE 10/01/2018 1822   PROTEINUR NEGATIVE 10/01/2018 1822   UROBILINOGEN 0.2 09/06/2012 0213   NITRITE NEGATIVE 10/01/2018 1822   LEUKOCYTESUR NEGATIVE 10/01/2018 1822   LEUKOCYTESUR Negative 01/18/2018 1539    Sepsis Labs: Lactic Acid, Venous    Component Value Date/Time   LATICACIDVEN 1.7 10/01/2018 1833    MICROBIOLOGY: No results found for this or any previous visit (from the past 240 hour(s)).  RADIOLOGY STUDIES/RESULTS: Dg Abd Portable 1v  Result Date: 11/29/2018 CLINICAL DATA:  Constipation EXAM: PORTABLE ABDOMEN - 1 VIEW COMPARISON:  11/03/2018 FINDINGS: Formed stool throughout most colonic segments. No evidence of small bowel obstruction or rectal impaction. Postoperative stomach. Cholecystectomy clips. Bilateral hip arthroplasty. IMPRESSION: Generalized stool correlating with history of constipation. No evidence of obstruction or rectal impaction. Electronically Signed   By: Monte Fantasia M.D.   On: 11/29/2018 07:35   Dg Abd Portable 1v  Result Date: 11/03/2018 CLINICAL DATA:  Constipation EXAM: PORTABLE ABDOMEN - 1 VIEW COMPARISON:  11/01/2018 FINDINGS: There is a non obstructive bowel gas pattern. No supine evidence of free air. No organomegaly or suspicious calcification. No acute bony abnormality. IMPRESSION: No acute findings. Electronically Signed   By: Rolm Baptise M.D.   On: 11/03/2018 11:32     LOS: 60 days    Signature  Lala Lund M.D on 12/03/2018 at 9:26 AM   -  To page go to [email or web address]

## 2018-12-03 NOTE — Progress Notes (Signed)
Physical Therapy Treatment Patient Details Name: [name] MRN: [id number] DOB: May 10, 1946 Today's Date: 12/03/2018    History of Present Illness 73 y.o. male admitted 10/01/18 with generalized weakness, falls and SOB. Workup revealed afib with recurrent R-side pleural effusion; AKI. Pt with L hip cellulitis with chronic L hip prosthetic joint infection; s/p joint aspiration 2/14. PMH includes HTN, afib on Eliquis, OSA, bipolar disorder, obesity, L THA with infection and multiple prolonged hospitalizations.    PT Comments    Pt performed short gait trial limited due to anxiety.  Pt continues to required moderate assistance with close chair follow.  Plan for short term SNF placement remains necessary to improve strength and function before returning home alone.      Follow Up Recommendations  SNF     Equipment Recommendations  Wheelchair (measurements PT);Wheelchair cushion (measurements PT);Hospital bed    Recommendations for Other Services       Precautions / Restrictions Precautions Precautions: Fall;Posterior Hip;Other (comment)(+ orthostatic hypotension) Precaution Comments: h/o falls, very weak, per pt report posterior hip precautions. Restrictions Weight Bearing Restrictions: No    Mobility  Bed Mobility Overal bed mobility: Needs Assistance Bed Mobility: Supine to Sit     Supine to sit: Mod assist     General bed mobility comments: Pt performed LE advancement and remains to require assistance to elevate trunk into sitting.    Transfers Overall transfer level: Needs assistance Equipment used: Rolling walker (2 wheeled) Transfers: Sit to/from Stand Sit to Stand: Mod assist;+2 physical assistance;From elevated surface         General transfer comment: Cues for hand placement and mod +2 to boost  into standing.   Pt with poor eccentric loading returning to recliner chair.  Pt's 1st trial performed from elevated EOB. 2nd trials performed from recliner chair.     Ambulation/Gait Ambulation/Gait assistance: Mod assist;+2 safety/equipment Gait Distance (Feet): 6 Feet Assistive device: Rolling walker (2 wheeled) Gait Pattern/deviations: Step-to pattern;Decreased step length - left;Decreased step length - right;Decreased stride length;Decreased dorsiflexion - right;Decreased dorsiflexion - left;Shuffle;Trunk flexed Gait velocity: decreased   General Gait Details: Pt with slow short shuffling pattern.  Pt became anxious and requested to sit.  DOE noted.  Attempted 2nd trial but unable to take steps and returned to seated position.     Stairs             Wheelchair Mobility    Modified Rankin (Stroke Patients Only)       Balance Overall balance assessment: Needs assistance   Sitting balance-[name]: Fair       Standing balance-[name]: Poor                              Cognition Arousal/Alertness: Awake/alert Behavior During Therapy: Agitated;Anxious                                   General Comments: Pt yelling with anxiety on 2nd trial.        Exercises      General Comments        Pertinent Vitals/Pain Pain Assessment: Faces Pain Score: 6  Pain Location: back, buttocks Pain Descriptors / Indicators: Sharp Pain Intervention(s): Monitored during session;Repositioned    Home Living                      Prior Function  PT Goals (current goals can now be found in the care plan section) Acute Rehab PT Goals Patient Stated Goal: To not live like this and get back to doing what he wants to do Potential to Achieve Goals: Fair Progress towards PT goals: Progressing toward goals    Frequency    Min 3X/week      PT Plan Current plan remains appropriate    Co-evaluation              AM-PAC PT "6 Clicks" Mobility   Outcome Measure  Help needed turning from your back to your side while in a flat bed without using bedrails?: A Lot Help needed  moving from lying on your back to sitting on the side of a flat bed without using bedrails?: A Lot Help needed moving to and from a bed to a chair (including a wheelchair)?: A Lot Help needed standing up from a chair using your arms (e.g., wheelchair or bedside chair)?: A Lot Help needed to walk in hospital room?: A Lot Help needed climbing 3-5 steps with a railing? : Total 6 Click Score: 11    End of Session Equipment Utilized During Treatment: Gait belt Activity Tolerance: Patient limited by pain Patient left: in bed;with call bell/phone within reach;with nursing/sitter in room Nurse Communication: Mobility status PT Visit Diagnosis: Other abnormalities of gait and mobility (R26.89);Muscle weakness (generalized) (M62.81);Difficulty in walking, not elsewhere classified (R26.2);History of falling (Z91.81)     Time: 3086-5784 PT Time Calculation (min) (ACUTE ONLY): 27 min  Charges:  $Gait Training: 8-22 mins $Therapeutic Activity: 8-22 mins                     Governor Rooks, PTA Acute Rehabilitation Services Pager [phone] Office [phone]     Micki Cassel Eli Hose 12/03/2018, 4:44 PM

## 2018-12-03 NOTE — TOC Progression Note (Signed)
Transition of Care [facility]) - Progression Note    Patient Details  Name: [name] MRN: [id number] Date of Birth: 1946-01-16  [facility] of [facility] CM/SW [place], LCSW Phone Number: 12/03/2018, 4:15 PM  Clinical Narrative:    CSW received call from [facility]. She states that [facility] will allow patient to discharge to SNF on Monday with the hope that Medicaid will soon be in place for long term care.   Patient will discharge to [facility] on Monday.   Expected Discharge Plan: [place]    Expected Discharge Plan and Services Expected Discharge Plan: Skilled Nursing Facility In-house Referral: Clinical Social Work Discharge Planning Services: CM Consult     Expected Discharge Date: 11/26/18                         Social Determinants of Health (SDOH) Interventions    Readmission Risk Interventions Readmission Risk Prevention Plan 09/11/2018  Transportation Screening Complete  Medication Review Press photographer) Complete  HRI or Home Care Consult Complete  Some recent data might be hidden

## 2018-12-04 ENCOUNTER — Inpatient Hospital Stay (HOSPITAL_COMMUNITY): Payer: Medicare Other

## 2018-12-04 LAB — CBC
HCT: 35.5 % — ABNORMAL LOW (ref 39.0–52.0)
Hemoglobin: 11.2 g/dL — ABNORMAL LOW (ref 13.0–17.0)
MCH: 28.5 pg (ref 26.0–34.0)
MCHC: 31.5 g/dL (ref 30.0–36.0)
MCV: 90.3 fL (ref 80.0–100.0)
Platelets: 237 10*3/uL (ref 150–400)
RBC: 3.93 MIL/uL — ABNORMAL LOW (ref 4.22–5.81)
RDW: 14.7 % (ref 11.5–15.5)
WBC: 9.3 10*3/uL (ref 4.0–10.5)
nRBC: 0 % (ref 0.0–0.2)

## 2018-12-04 LAB — URINALYSIS, ROUTINE W REFLEX MICROSCOPIC
Bilirubin Urine: NEGATIVE
Glucose, UA: NEGATIVE mg/dL
Hgb urine dipstick: NEGATIVE
Ketones, ur: NEGATIVE mg/dL
Leukocytes,Ua: NEGATIVE
Nitrite: NEGATIVE
Protein, ur: NEGATIVE mg/dL
Specific Gravity, Urine: 1.009 (ref 1.005–1.030)
pH: 6 (ref 5.0–8.0)

## 2018-12-04 MED ORDER — OXYCODONE HCL 5 MG PO TABS
5.0000 mg | ORAL_TABLET | ORAL | Status: DC | PRN
  Administered 2018-12-04 – 2018-12-07 (×13): 5 mg via ORAL
  Filled 2018-12-04 (×13): qty 1

## 2018-12-04 NOTE — Progress Notes (Signed)
PROGRESS NOTE        PATIENT DETAILS Name: [name] Age: 73 y.o. Sex: male Date of Birth: Jan 07, 1946 Admit Date: 10/01/2018 Admitting Physician Vianne Bulls, MD JFH:[id number], Hollice Espy, MD  Brief Narrative: Patient is a 73 y.o. male with history of A. 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  Found to have a soft tissue infection of the left hip area, acute kidney injury and hyperkalemia.  ID, orthopedics consulted-underwent left hip aspiration on 2/14, infectious disease recommending 6 weeks of IV Invanz. Finished 3/26.   Subjective:  Patient in bed, appears comfortable, denies any headache, no fever, no chest pain or pressure, no shortness of breath , no abdominal pain. No focal weakness.  Mild right shoulder discomfort which is chronic, eye itching and discomfort almost completely resolved.   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  Was on suppressive Flagyl for Bacteroides fragilis infection-recommendations from ID are for 6 weeks of IV ertapenem-end date of November 25, 2018. He has finished all his antibiotics while in the hospital.  AKI: Likely hemodynamically mediated-secondary to soft tissue infection of the left hip, diuretic use.  Resolved with supportive care.    Chronic hypotension.  Intermittent IV fluid, Midodrine TID scheduled.   Asymptomatic.    History of left total hip arthroplasty with subsequent infection with Bacteroides fragilis: Patient was evaluated by infectious disease at [facility] was previously on Flagyl.  Now on IV Invanz (as above)-Flagyl has been discontinued. He has finished all his antibiotics.  Recurrent pleural effusion: Felt to be secondary to hepatic hydrothorax-most recent CT chest on 10/07/2018 shows a small pleural effusion-patient is asymptomatic at this point.  Doubt further work-up required-furthermore he has had a recent extensive work-up including multiple thoracocentesis   Cryptogenic cirrhosis: Prior hepatitis serology done earlier this month was negative-agree with plans outlined in prior discharge summary for outpatient follow-up.  Volume status is stable-he is awake and alert-continue Aldactone  Atrial fibrillation: Rate controlled with Cardizem.Continue Eliquis.  Chronic back and right shoulder pain: Continue Lidoderm patch, Tylenol and as needed narcotics.  Stable repeat x-ray on 12/03/2018.  Bilateral itching in eyes with some thin crusty discharge and some discomfort.  Resolved after he was placed on Patanol drops will monitor.   Bipolar disorder: Stable-continue Lexapro, Neurontin  Constipation: Bowel regimen, exam and x-ray unremarkable, placed on Movantik on 11/28/2018 continue, he has narcotic bowel.  Right shoulder pain: chornic improves with steroid trials..  OSA: Continue CPAP nightly  Morbid obesity  Acute on chronic debility/deconditioning: Has chronic debility/deconditioning at baseline-this is worsened due to acute illness/AKI/left hip infection.  Transfer to skilled nursing when bed is available.  Medically stable.  Insomnia: Been placed on trazodone will add Ambien for better symptom control.  Still complaining of insomnia.    DVT Prophylaxis: Full dose anticoagulation with Eliquis  Code  Status: Full code  Family Communication: No  family at bedside.  Disposition Plan: Remain inpatient- SNF on discharge when bed available.  Medically stable.  Unable to transition out of the hospital.  Antimicrobial agents: Anti-infectives (From admission, onward)   Start     Dose/Rate Route Frequency Ordered Stop   10/15/18 1500  cefTRIAXone (ROCEPHIN) 2 g in sodium chloride 0.9 % 100 mL IVPB  Status:  Discontinued     2 g 200 mL/hr over 30 Minutes Intravenous Every 24 hours 10/15/18 1434 10/15/18 1447   10/15/18 1500  ertapenem (INVANZ) 1,000 mg in sodium chloride 0.9 % 100 mL IVPB     1 g 200 mL/hr over 30 Minutes Intravenous Every 24 hours 10/15/18 1447 11/25/18 1604   10/08/18 0000  doxycycline (VIBRA-TABS) 100 MG tablet  Status:  Discontinued     100 mg Oral Every 12 hours 10/08/18 0921 11/26/18    10/06/18 1000  doxycycline (VIBRA-TABS) tablet 100 mg  Status:  Discontinued     100 mg Oral Every 12 hours 10/06/18 0726 10/11/18 1328   10/03/18 1800  metroNIDAZOLE (FLAGYL) tablet 500 mg  Status:  Discontinued     500 mg Oral Every 8 hours 10/03/18 1054 10/15/18 1447   10/03/18 0700  vancomycin (VANCOCIN) 2,000 mg in sodium chloride 0.9 % 500 mL IVPB  Status:  Discontinued     2,000 mg 250 mL/hr over 120 Minutes Intravenous Every 36 hours 10/01/18 1942 10/06/18 0726   10/02/18 0200  ceFEPIme (MAXIPIME) 2 g in sodium chloride 0.9 % 100 mL IVPB  Status:  Discontinued     2 g 200 mL/hr over 30 Minutes Intravenous Every 8 hours 10/01/18 1942 10/02/18 1008   10/01/18 1830  vancomycin (VANCOCIN) 2,500 mg in sodium chloride 0.9 % 500 mL IVPB     2,500 mg 250 mL/hr over 120 Minutes Intravenous STAT 10/01/18 1825 10/02/18 0126   10/01/18 1800  ceFEPIme (MAXIPIME) 2 g in sodium chloride 0.9 % 100 mL IVPB     2 g 200 mL/hr over 30 Minutes Intravenous  Once 10/01/18 1747 10/01/18 1903   10/01/18 1800  metroNIDAZOLE (FLAGYL) IVPB 500 mg  Status:  Discontinued     500 mg 100 mL/hr over 60 Minutes Intravenous Every 8 hours 10/01/18 1747  10/03/18 1054   10/01/18 1800  vancomycin (VANCOCIN) IVPB 1000 mg/200 mL premix  Status:  Discontinued     1,000 mg 200 mL/hr over 60 Minutes Intravenous  Once 10/01/18 1747 10/01/18 1825      Procedures: None  CONSULTS:  None  Time spent: 15- minutes  MEDICATIONS: Scheduled Meds: . apixaban  5 mg Oral BID  . Chlorhexidine Gluconate Cloth  6 each Topical Daily  . cycloSPORINE  1 drop Both Eyes BID  . diclofenac sodium  2 g Topical BID  . diltiazem  120 mg Oral Daily  . escitalopram  10 mg Oral Daily  . gabapentin  100 mg Oral TID  . lactulose  30 g Oral BID  . lidocaine  1 patch Transdermal Q24H  . Melatonin  6 mg Oral QHS  . midodrine  10 mg Oral TID WC  . multivitamin with minerals  1 tablet Oral Daily  . olopatadine  1 drop Both Eyes BID  . pantoprazole  40 mg Oral Daily  . polyethylene glycol  17 g Oral BID  . Ensure Max Protein  11 oz Oral BID  . senna-docusate  2 tablet Oral BID  . spironolactone  50  mg Oral Daily  . thiamine  100 mg Oral Daily   Continuous Infusions: . sodium chloride 10 mL/hr at 11/21/18 1900   PRN Meds:.sodium chloride, acetaminophen, albuterol, alum & mag hydroxide-simeth, diltiazem, guaiFENesin-dextromethorphan, hydrOXYzine, menthol-cetylpyridinium, [DISCONTINUED] ondansetron **OR** ondansetron (ZOFRAN) IV, oxyCODONE, sodium chloride flush, sodium phosphate, traZODone, zolpidem   PHYSICAL EXAM: Vital signs: Vitals:   12/03/18 1427 12/03/18 1654 12/03/18 2311 12/04/18 0537  BP: 110/66  94/63   Pulse: 92  (!) 106   Resp: 19  16   Temp: 100 F (37.8 C) 98.5 F (36.9 C) 100.2 F (37.9 C)   TempSrc:  Oral Oral   SpO2: 95%  95%   Weight:    124.5 kg  Height:       Filed Weights   12/02/18 0554 12/03/18 0644 12/04/18 0537  Weight: 119.3 kg 123.4 kg 124.5 kg   Body mass index is 41.73 kg/m.   Exam  Awake Alert, Oriented X 3, No new F.N deficits, Normal affect [place].AT, mild conjunctival redness in both eyes with some thin crusty  material on the sides Supple Neck,No JVD, No cervical lymphadenopathy appriciated.  Symmetrical Chest wall movement, Good air movement bilaterally, CTAB RRR,No Gallops, Rubs or new Murmurs, No Parasternal Heave +ve B.Sounds, Abd Soft, No tenderness, No organomegaly appriciated, No rebound - guarding or rigidity. No Cyanosis, Clubbing or edema, No new Rash or bruise  LABORATORY DATA: CBC: Recent Labs  Lab 12/01/18 0416  WBC 9.2  HGB 11.4*  HCT 35.6*  MCV 89.2  PLT 858    Basic Metabolic Panel: Recent Labs  Lab 12/01/18 0416  NA 138  K 4.3  CL 103  CO2 27  GLUCOSE 108*  BUN 37*  CREATININE 1.06  CALCIUM 9.9  MG 2.0    GFR: Estimated Creatinine Clearance: 80.9 mL/min (by C-G formula based on SCr of 1.06 mg/dL).  Liver Function Tests: Recent Labs  Lab 12/01/18 0416  AST 16  ALT 25  ALKPHOS 88  BILITOT 0.6  PROT 6.3*  ALBUMIN 2.7*   No results for input(s): LIPASE, AMYLASE in the last 168 hours. No results for input(s): AMMONIA in the last 168 hours.  Coagulation Profile: No results for input(s): INR, PROTIME in the last 168 hours.  Cardiac Enzymes: No results for input(s): CKTOTAL, CKMB, CKMBINDEX, TROPONINI in the last 168 hours.  BNP (last 3 results) No results for input(s): PROBNP in the last 8760 hours.  HbA1C: No results for input(s): HGBA1C in the last 72 hours.  CBG: No results for input(s): GLUCAP in the last 168 hours.  Lipid Profile: No results for input(s): CHOL, HDL, LDLCALC, TRIG, CHOLHDL, LDLDIRECT in the last 72 hours.  Thyroid Function Tests: No results for input(s): TSH, T4TOTAL, FREET4, T3FREE, THYROIDAB in the last 72 hours.  Anemia Panel: No results for input(s): VITAMINB12, FOLATE, FERRITIN, TIBC, IRON, RETICCTPCT in the last 72 hours.  Urine analysis:    Component Value Date/Time   COLORURINE STRAW (A) 12/04/2018 0813   APPEARANCEUR CLEAR 12/04/2018 0813   APPEARANCEUR Clear 01/18/2018 1539   LABSPEC 1.009 12/04/2018  0813   PHURINE 6.0 12/04/2018 0813   GLUCOSEU NEGATIVE 12/04/2018 0813   HGBUR NEGATIVE 12/04/2018 0813   BILIRUBINUR NEGATIVE 12/04/2018 0813   BILIRUBINUR Negative 01/18/2018 1539   KETONESUR NEGATIVE 12/04/2018 0813   PROTEINUR NEGATIVE 12/04/2018 0813   UROBILINOGEN 0.2 09/06/2012 0213   NITRITE NEGATIVE 12/04/2018 0813   LEUKOCYTESUR NEGATIVE 12/04/2018 0813    Sepsis Labs: Lactic Acid, Venous  Component Value Date/Time   LATICACIDVEN 1.7 10/01/2018 1833    MICROBIOLOGY: No results found for this or any previous visit (from the past 240 hour(s)).  RADIOLOGY STUDIES/RESULTS: Dg Shoulder Right  Result Date: 12/03/2018 CLINICAL DATA:  Pulling injury 1 week ago with persistent shoulder pain, initial encounter EXAM: RIGHT SHOULDER - 2+ VIEW COMPARISON:  09/05/2018 FINDINGS: Mild degenerative changes of the glenohumeral articulation are seen. No definitive fracture or dislocation is seen. The underlying bony thorax is unremarkable. IMPRESSION: Degenerative change without acute abnormality. Electronically Signed   By: Inez Catalina M.D.   On: 12/03/2018 10:13   Dg Chest Port 1 View  Result Date: 12/04/2018 CLINICAL DATA:  Shortness of breath EXAM: PORTABLE CHEST 1 VIEW COMPARISON:  10/16/2018 FINDINGS: Left-sided PICC with tip at the upper SVC. Pleural thickening and volume loss on the left, stable and partially calcified by CT. Trace pleural fluid or scarring on the right. Cardiomegaly. There is no edema, consolidation, effusion, or pneumothorax. IMPRESSION: 1. No acute finding when compared to priors. 2. Left fibrothorax. Electronically Signed   By: Monte Fantasia M.D.   On: 12/04/2018 08:40   Dg Abd Portable 1v  Result Date: 11/29/2018 CLINICAL DATA:  Constipation EXAM: PORTABLE ABDOMEN - 1 VIEW COMPARISON:  11/03/2018 FINDINGS: Formed stool throughout most colonic segments. No evidence of small bowel obstruction or rectal impaction. Postoperative stomach. Cholecystectomy clips.  Bilateral hip arthroplasty. IMPRESSION: Generalized stool correlating with history of constipation. No evidence of obstruction or rectal impaction. Electronically Signed   By: Monte Fantasia M.D.   On: 11/29/2018 07:35     LOS: 61 days    Signature  Lala Lund M.D on 12/04/2018 at 10:02 AM   -  To page go to [email or web address]

## 2018-12-04 NOTE — Progress Notes (Signed)
Pt asked for PRN oxycodone but was told it wasn't due yet. Pt reported that he was supposed to receive Oxycodone 5mg  q 4hrs as per earlier  conversation with attending.  Pt was notably agitated that oxycodone 5mg  was ordered q 8 hrs. " You have to talk with the doctor"  Pt was raising his voice.  Provider on call requested to order Oxycodone 5mg  one time order

## 2018-12-05 MED ORDER — VALACYCLOVIR HCL 500 MG PO TABS
1000.0000 mg | ORAL_TABLET | Freq: Three times a day (TID) | ORAL | Status: DC
  Administered 2018-12-05 – 2018-12-07 (×7): 1000 mg via ORAL
  Filled 2018-12-05 (×8): qty 2

## 2018-12-05 NOTE — Consult Note (Signed)
Ophthalmology Initial Consult Note  Dyson, Sevey, 73 y.o. male Date of Service:  12/05/2018  Requesting physician: Thurnell Lose, MD  Information Obtained from: patient, chart Chief Complaint:  Eye pain, photophobia OS  HPI/Discussion:  [name] is a 73 y.o. male with multiple medical problems complaining of several days of left eye pain and photophobia. He also notes new rash of 2 days duration, which is most prominent along his scalp and forehead. He says the rash is itchy and tingly. He denies any other rashes. He denies flashes, floaters, or curtains. He has no ocular complaints OD.  ROS(+): left eye irritation, mild photophobia, rash (in left V1 dermatome)  Past Ocular Hx:  Giant cell arteritis - managed by rheumatology (Dr. Gavin Pound) Ocular Meds:  Olopatadine (started in hospital) Family ocular history: Noncontributory  Past Medical History:  Diagnosis Date  . Anginal pain ([place]) 2006   evaluated by cardio  . Arthritis    knees,feet,shoulders,elbows.hands  . Constipation   . Dyspnea    with exertion   . Dysrhythmia    Atrial Flutter- 2006- corredted itself  . Family history of adverse reaction to anesthesia    mother- with novocaine went into shock  . GERD (gastroesophageal reflux disease)   . Headache   . Hemorrhoids   . History of blood transfusion   . Hypertension   . Insomnia   . Sleep apnea    cpap  . 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 APPENDECTOMY    . ARTERY BIOPSY Left 12/30/2017   Procedure: BIOPSY TEMPORAL ARTERY;  Surgeon: Fanny Skates, MD;  Location: [facility];  Service: General;  Laterality: Left;  . CHOLECYSTECTOMY    . COLONOSCOPY W/ POLYPECTOMY    . ESOPHAGOGASTRODUODENOSCOPY (EGD) WITH PROPOFOL  07/06/2012   Procedure: ESOPHAGOGASTRODUODENOSCOPY (EGD) WITH PROPOFOL;  Surgeon: Jeryl Columbia, MD;  Location: [facility];  Service: Endoscopy;  Laterality: N/A;  .  ESOPHAGOSCOPY    . EYE SURGERY     left eye- muscle repair  . HERNIA REPAIR     ventral hernia  . INCISION AND DRAINAGE HIP Left 03/17/2018   Procedure: IRRIGATION AND DEBRIDEMENT LEFT THIGH WOUND;  Surgeon: Gaynelle Arabian, MD;  Location: [facility];  Service: Orthopedics;  Laterality: Left;  . JOINT REPLACEMENT     bilateral hips.  Right broke and had to be re placed again  . MASS EXCISION N/A 03/02/2015   Procedure: EXCISION ABDOMINAL WALL MASS;  Surgeon: Alphonsa Overall, MD;  Location: [facility];  Service: General;  Laterality: N/A;  . TOTAL HIP REVISION Left 02/17/2018   Procedure: Left total hip arthroplasty revision;  Surgeon: Gaynelle Arabian, MD;  Location: [facility];  Service: Orthopedics;  Laterality: Left;  Marland Kitchen VERTICAL BANDED GASTROPLASTY      Prior to Admission Meds: Medications Prior to Admission  Medication Sig Dispense Refill Last Dose  . albuterol (PROVENTIL HFA;VENTOLIN HFA) 108 (90 Base) MCG/ACT inhaler Inhale 1-2 puffs into the lungs every 6 (six) hours as needed for wheezing or shortness of breath. 1 Inhaler 0 unk  . apixaban (ELIQUIS) 5 MG TABS tablet Take 1 tablet (5 mg total) by mouth 2 (two) times daily. 60 tablet 0 10/01/2018 at 1000  . calcium carbonate (TUMS - DOSED IN MG ELEMENTAL CALCIUM) 500 MG chewable tablet Chew 1,000 mg by mouth 3 (three) times daily as needed for indigestion.   unk  . carboxymethylcellulose (REFRESH TEARS) 0.5 % SOLN  Place 1 drop into both eyes daily as needed (dry eye/irritation). 15 mL 0 unk  . Cholecalciferol (VITAMIN D3) 2000 units TABS Take 2,000 Units by mouth every evening.    09/30/2018 at Unknown time  . cycloSPORINE (RESTASIS) 0.05 % ophthalmic emulsion Place 1 drop into both eyes 2 (two) times daily. 1.5 mL 0 10/01/2018 at Unknown time  . diltiazem (CARDIZEM CD) 180 MG 24 hr capsule Take 1 capsule (180 mg total) by mouth daily. 30 capsule 0 10/01/2018 at Unknown time  . escitalopram (LEXAPRO) 10 MG tablet Take 1 tablet (10 mg total) by mouth daily. 30  tablet 0 10/01/2018 at Unknown time  . ferrous sulfate 325 (65 FE) MG tablet Take 1 tablet (325 mg total) by mouth 3 (three) times daily with meals. 90 tablet 0 10/01/2018 at Unknown time  . gabapentin (NEURONTIN) 100 MG capsule Take 1 capsule (100 mg total) by mouth 3 (three) times daily. 90 capsule 0 10/01/2018 at Unknown time  . hydrOXYzine (ATARAX/VISTARIL) 25 MG tablet Take 1 tablet (25 mg total) by mouth 3 (three) times daily as needed for itching or anxiety. 15 tablet 0 10/01/2018 at Unknown time  . lidocaine (LIDODERM) 5 % Place 1 patch onto the skin daily. Remove & Discard patch within 12 hours or as directed by MD 30 patch 0 unk  . Melatonin 3 MG TABS Take 2 tablets (6 mg total) by mouth at bedtime. 30 tablet 0 09/30/2018 at Unknown time  . metoprolol tartrate (LOPRESSOR) 25 MG tablet Take 1 tablet (25 mg total) by mouth 2 (two) times daily. 60 tablet 0 10/01/2018 at 1000  . metroNIDAZOLE (FLAGYL) 500 MG tablet Take 1 tablet (500 mg total) by mouth 2 (two) times daily. 60 tablet 0 10/01/2018 at Unknown time  . Multiple Vitamins-Minerals (CENTRUM ADULTS PO) Take 1 tablet by mouth every evening.    09/30/2018 at Unknown time  . oxyCODONE (OXY IR/ROXICODONE) 5 MG immediate release tablet Take 1 tablet (5 mg total) by mouth every 6 (six) hours as needed for moderate pain, severe pain or breakthrough pain. 20 tablet 0 10/01/2018 at Unknown time  . pantoprazole (PROTONIX) 40 MG tablet Take 1 tablet (40 mg total) by mouth daily. 30 tablet 0 10/01/2018 at Unknown time  . polyethylene glycol (MIRALAX / GLYCOLAX) packet Take 17 g by mouth daily as needed for mild constipation. 14 each 0 unk  . potassium chloride SA (K-DUR,KLOR-CON) 20 MEQ tablet Take 1 tablet (20 mEq total) by mouth daily. 30 tablet 0 10/01/2018 at Unknown time  . rOPINIRole (REQUIP) 0.25 MG tablet Take 1 tablet (0.25 mg total) by mouth at bedtime. 30 tablet 0 09/30/2018 at Unknown time  . spironolactone (ALDACTONE) 50 MG tablet Take 100mg  every  morning and 50mg  every evening 90 tablet 0 10/01/2018 at Unknown time  . thiamine 100 MG tablet Take 100 mg by mouth daily.   10/01/2018 at Unknown time  . [DISCONTINUED] furosemide (LASIX) 20 MG tablet Take 3 tablets (60 mg total) by mouth daily. 90 tablet 1 10/01/2018 at Unknown time  . acetaminophen (TYLENOL) 325 MG tablet Take 2 tablets (650 mg total) by mouth every 6 (six) hours as needed for mild pain, fever or headache. (Patient not taking: Reported on 10/01/2018)   Not Taking at Unknown time  . senna-docusate (SENOKOT-S) 8.6-50 MG tablet Take 2 tablets by mouth 2 (two) times daily. (Patient not taking: Reported on 10/01/2018) 60 tablet 0 Not Taking at Unknown time    Inpatient Meds: See  med reconciliation  Allergies  Allergen Reactions  . Bee Venom Swelling  . Nickel Rash   Social History   Tobacco Use  . Smoking status: Former Smoker    Packs/day: 2.00    Years: 10.00    Pack years: 20.00    Types: Cigarettes    Start date: 05/16/1966    Last attempt to quit: 03/11/1984    Years since quitting: 34.7  . Smokeless tobacco: Never Used  Substance Use Topics  . Alcohol use: Yes    Alcohol/week: 0.0 standard drinks    Comment: occasionally a couple of times a year    Family History  Problem Relation Age of Onset  . Cancer Mother   . Alcohol abuse Father     ROS: Other than ROS in the HPI, all other systems were negative.  Exam: Temp: 99 F (37.2 C) Pulse Rate: 98 BP: (!) 105/59 Resp: 18 SpO2: 96 %  Visual Acuity:  near   OD 20/20-   OS 20/30+     OD OS  Confr Vis Fields Full Full  EOM (Primary) Full Full  Lids/Lashes Normal Mild upper lid erythema and tenderness, minimal lower lid erythema/tenderness  Conjunctiva White, quiet 2+ follicular reaction, moderate mucoid discharge  Adnexa  Normal Rash along V1 dermatome worse on the upper lid nasally and involving forehead and scalp posteriorly, negative Hutchinson  Pupils  3 --> 2, brisk, no rAPD 3 --> 2, brisk, no  rAPD  Cornea  Arcus, clear Arcus, clear, no fluorescein staining  Anterior Chamber Formed, grossly quiet Formed, grossly quiet  Lens:  Clear Clear  IOP (tonopen) 17 18  Fundus - Dilated? YES   Optic Disc - C:D Ratio 0.2 0.2                     Appearance  Normal, no pallor or edema Normal, no pallor or edema                     NF Layer Normal Normal  Post Seg:  Retina                    Vessels Normal caliber Normal caliber                  Vitreous  Clear Clear                  Macula Good foveal light reflex Good foveal light reflex                  Periphery Normal, no holes or tears Normal, no holes or tears. No necrosis.       Neuro:  Oriented to person, place, and time:  Yes Psychiatric:  Mood and Affect Appropriate:  Yes  Labs/imaging:   A/P:  73 y.o. male with complex medical history with 2-day history of rash along left scalp and forehead in the V1 distribution c/w diagnosis of:  1) Herpes zoster ophthalmicus, left side - Negative Hutchinson sign (e.g. spares the tip of the nose). - Ocular involvement is limited to conjunctivitis at this time. No corneal involvement, likely no anterior chamber involvement (albeit difficult to rule out at bedside), no involvement of the posterior segment. - Recommend systemic antivirals (PO acyclovir 800 mg 5x/day OR valacyclovir 1000 mg TID) for a minimum of 10 days, artificial tears 4-6x/day as needed, and cool compresses x5 minutes PRN. - I understand he will be going to a rehabilitation  facility. Given his comorbidities, I recommend continuing antiviral treatment (e.g. valacyclovir 1000 mg qDay) for a full month to minimize likelihood of subsequent ocular involvement. - Recommend that the patient follow up with his regular ophthalmologist (Dr. Midge Aver) in 3-4 weeks.  No contraindication to continuing Restasis.  [place], MD  [address] [place] [address] [place],  [address] [phone]  [place], MD 12/05/2018, 1:34 PM

## 2018-12-05 NOTE — Progress Notes (Signed)
   11/16/18 2124  BiPAP/CPAP/SIPAP  BiPAP/CPAP/SIPAP Pt Type Adult  Mask Type Nasal mask  Respiratory Rate 16 breaths/min  Oxygen Percent 21 %  BiPAP/CPAP/SIPAP CPAP  Patient Home Equipment Yes  Auto Titrate Yes  CPAP/SIPAP surface wiped down Yes  BiPAP/CPAP /SiPAP Vitals  Pulse Rate 94  Resp 16  SpO2 99 %  Bilateral Breath Sounds Clear;Diminished

## 2018-12-05 NOTE — Plan of Care (Signed)
  Problem: Education: Goal: Knowledge of General Education information will improve Description Including pain rating scale, medication(s)/side effects and non-pharmacologic comfort measures Outcome: Progressing   Problem: Health Behavior/Discharge Planning: Goal: Ability to manage health-related needs will improve Outcome: Progressing   Problem: Clinical Measurements: Goal: Ability to maintain clinical measurements within normal limits will improve Outcome: Progressing Goal: Will remain free from infection Outcome: Progressing Goal: Diagnostic test results will improve Outcome: Progressing Goal: Respiratory complications will improve Outcome: Progressing Goal: Cardiovascular complication will be avoided Outcome: Progressing   Problem: Activity: Goal: Risk for activity intolerance will decrease Outcome: Progressing   Problem: Coping: Goal: Level of anxiety will decrease Outcome: Progressing   Problem: Elimination: Goal: Will not experience complications related to bowel motility Outcome: Progressing Goal: Will not experience complications related to urinary retention Outcome: Progressing   Problem: Pain Managment: Goal: General experience of comfort will improve Outcome: Progressing   Problem: Safety: Goal: Ability to remain free from injury will improve Outcome: Progressing   Problem: Skin Integrity: Goal: Risk for impaired skin integrity will decrease Outcome: Progressing   Problem: Education: Goal: Knowledge of General Education information will improve Description Including pain rating scale, medication(s)/side effects and non-pharmacologic comfort measures Outcome: Progressing   Problem: Health Behavior/Discharge Planning: Goal: Ability to manage health-related needs will improve Outcome: Progressing   Problem: Clinical Measurements: Goal: Ability to maintain clinical measurements within normal limits will improve Outcome: Progressing Goal: Will  remain free from infection Outcome: Progressing Goal: Diagnostic test results will improve Outcome: Progressing Goal: Respiratory complications will improve Outcome: Progressing Goal: Cardiovascular complication will be avoided Outcome: Progressing   Problem: Activity: Goal: Risk for activity intolerance will decrease Outcome: Progressing   Problem: Nutrition: Goal: Adequate nutrition will be maintained Outcome: Progressing   Problem: Coping: Goal: Level of anxiety will decrease Outcome: Progressing   Problem: Elimination: Goal: Will not experience complications related to bowel motility Outcome: Progressing Goal: Will not experience complications related to urinary retention Outcome: Progressing   Problem: Pain Managment: Goal: General experience of comfort will improve Outcome: Progressing   Problem: Safety: Goal: Ability to remain free from injury will improve Outcome: Progressing   Problem: Skin Integrity: Goal: Risk for impaired skin integrity will decrease Outcome: Progressing   Problem: Fluid Volume: Goal: Fluid volume balance will be maintained or improved Outcome: Progressing   Problem: Self-Concept: Goal: Body image disturbance will be avoided or minimized Outcome: Progressing   Problem: Urinary Elimination: Goal: Progression of disease will be identified and treated Outcome: Progressing

## 2018-12-05 NOTE — Progress Notes (Signed)
PROGRESS NOTE        PATIENT DETAILS Name: [name] Age: 74 y.o. Sex: male Date of Birth: [birth year] Admit Date: 10/01/2018 Admitting Physician Vianne Bulls, MD KGM:[id number], Hollice Espy, MD  Brief Narrative: Patient is a 73 y.o. male with history of A. 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  Found to have a soft tissue infection of the left hip area, acute kidney injury and hyperkalemia.  ID, orthopedics consulted-underwent left hip aspiration on 2/14, infectious disease recommending 6 weeks of IV Invanz. Finished 3/26.   Subjective:  Patient in bed, appears comfortable, denies any headache, no fever, no chest pain or pressure, no shortness of breath , no abdominal pain. No focal weakness.  Mild right shoulder discomfort which is chronic, has worsening left eye pain with mild photophobia.   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  Was on suppressive Flagyl for Bacteroides fragilis infection-recommendations from ID are for 6 weeks of IV ertapenem-end date of November 25, 2018. He has finished all his antibiotics while in the hospital.  AKI: Likely hemodynamically mediated-secondary to soft tissue infection of the left hip, diuretic use.  Resolved with supportive care.    Chronic hypotension.  Intermittent IV fluid, Midodrine TID scheduled.   Asymptomatic.    History of left total hip arthroplasty with subsequent infection with Bacteroides fragilis: Patient was evaluated by infectious disease at [facility] was previously on Flagyl.  Now on IV Invanz (as above)-Flagyl has been discontinued. He has finished all his antibiotics.  Recurrent pleural effusion: Felt to be secondary to hepatic hydrothorax-most recent CT chest on 10/07/2018 shows a small pleural effusion-patient is asymptomatic at this point.  Doubt further work-up required-furthermore he has had a recent extensive work-up including multiple thoracocentesis   Cryptogenic cirrhosis: Prior hepatitis serology done earlier this month was negative-agree with plans outlined in prior discharge summary for outpatient follow-up.  Volume status is stable-he is awake and alert-continue Aldactone  Atrial fibrillation: Rate controlled with Cardizem.Continue Eliquis.  Chronic back and right shoulder pain: Continue Lidoderm patch, Tylenol and as needed narcotics.  Stable repeat x-ray on 12/03/2018.  Bilateral itching in eyes L>R with some thin crusty discharge and some discomfort.  Actually improved after he was placed on Patanol drops on 12/04/2018, on 12/05/2018 he is complaining of more discomfort in the left eye with mild photophobia, case discussed with ophthalmologist Dr. Carolynn Sayers who will see the patient, question if he has keratitis.  Bipolar disorder: Stable-continue Lexapro, Neurontin  Constipation: Bowel regimen, exam and x-ray unremarkable, placed on Movantik on 11/28/2018 continue, he has narcotic bowel.  Right shoulder pain: chornic improves with steroid trials..  OSA: Continue CPAP nightly  Morbid obesity  Acute on chronic debility/deconditioning: Has chronic debility/deconditioning at baseline-this is worsened due to acute illness/AKI/left hip infection.  Transfer to skilled nursing when bed is available.  Medically stable.  Insomnia: Been placed on trazodone  will add Ambien for better symptom control.  Still complaining of insomnia.    DVT Prophylaxis: Full dose anticoagulation with Eliquis  Code Status: Full code  Family Communication: No family at bedside.  Disposition Plan: Remain inpatient- SNF on discharge when bed available.  Medically stable.  Unable to transition out of the hospital.  Antimicrobial agents: Anti-infectives (From admission, onward)   Start     Dose/Rate Route Frequency Ordered Stop   10/15/18 1500  cefTRIAXone (ROCEPHIN) 2 g in sodium chloride 0.9 % 100 mL IVPB  Status:  Discontinued     2 g 200 mL/hr over 30 Minutes Intravenous Every 24 hours 10/15/18 1434 10/15/18 1447   10/15/18 1500  ertapenem (INVANZ) 1,000 mg in sodium chloride 0.9 % 100 mL IVPB     1 g 200 mL/hr over 30 Minutes Intravenous Every 24 hours 10/15/18 1447 11/25/18 1604   10/08/18 0000  doxycycline (VIBRA-TABS) 100 MG tablet  Status:  Discontinued     100 mg Oral Every 12 hours 10/08/18 0921 11/26/18    10/06/18 1000  doxycycline (VIBRA-TABS) tablet 100 mg  Status:  Discontinued     100 mg Oral Every 12 hours 10/06/18 0726 10/11/18 1328   10/03/18 1800  metroNIDAZOLE (FLAGYL) tablet 500 mg  Status:  Discontinued     500 mg Oral Every 8 hours 10/03/18 1054 10/15/18 1447   10/03/18 0700  vancomycin (VANCOCIN) 2,000 mg in sodium chloride 0.9 % 500 mL IVPB  Status:  Discontinued     2,000 mg 250 mL/hr over 120 Minutes Intravenous Every 36 hours 10/01/18 1942 10/06/18 0726   10/02/18 0200  ceFEPIme (MAXIPIME) 2 g in sodium chloride 0.9 % 100 mL IVPB  Status:  Discontinued     2 g 200 mL/hr over 30 Minutes Intravenous Every 8 hours 10/01/18 1942 10/02/18 1008   10/01/18 1830  vancomycin (VANCOCIN) 2,500 mg in sodium chloride 0.9 % 500 mL IVPB     2,500 mg 250 mL/hr over 120 Minutes Intravenous STAT 10/01/18 1825 10/02/18 0126   10/01/18 1800  ceFEPIme (MAXIPIME) 2 g in sodium chloride 0.9 % 100 mL IVPB     2 g 200 mL/hr over 30 Minutes  Intravenous  Once 10/01/18 1747 10/01/18 1903   10/01/18 1800  metroNIDAZOLE (FLAGYL) IVPB 500 mg  Status:  Discontinued     500 mg 100 mL/hr over 60 Minutes Intravenous Every 8 hours 10/01/18 1747 10/03/18 1054   10/01/18 1800  vancomycin (VANCOCIN) IVPB 1000 mg/200 mL premix  Status:  Discontinued     1,000 mg 200 mL/hr over 60 Minutes Intravenous  Once 10/01/18 1747 10/01/18 1825      Procedures: None  CONSULTS: Ortho ID Ophthalmology  Time spent: 15- minutes  MEDICATIONS: Scheduled Meds: . apixaban  5 mg Oral BID  . Chlorhexidine Gluconate Cloth  6 each Topical Daily  . cycloSPORINE  1 drop Both Eyes BID  . diclofenac sodium  2 g Topical BID  . diltiazem  120 mg Oral Daily  . escitalopram  10 mg Oral Daily  . gabapentin  100 mg Oral TID  . lactulose  30 g Oral BID  . lidocaine  1 patch Transdermal Q24H  . Melatonin  6 mg Oral QHS  . midodrine  10 mg Oral TID WC  . multivitamin with minerals  1 tablet Oral Daily  . olopatadine  1 drop Both Eyes BID  . pantoprazole  40 mg Oral  Daily  . polyethylene glycol  17 g Oral BID  . Ensure Max Protein  11 oz Oral BID  . senna-docusate  2 tablet Oral BID  . spironolactone  50 mg Oral Daily  . thiamine  100 mg Oral Daily   Continuous Infusions: . sodium chloride 10 mL/hr at 11/21/18 1900   PRN Meds:.sodium chloride, acetaminophen, albuterol, alum & mag hydroxide-simeth, diltiazem, guaiFENesin-dextromethorphan, hydrOXYzine, menthol-cetylpyridinium, [DISCONTINUED] ondansetron **OR** ondansetron (ZOFRAN) IV, oxyCODONE, sodium chloride flush, sodium phosphate, traZODone, zolpidem   PHYSICAL EXAM: Vital signs: Vitals:   12/04/18 1346 12/04/18 2152 12/05/18 0526 12/05/18 0619  BP: 106/71 103/71 (!) 93/54 106/74  Pulse: 83 92 (!) 101   Resp: 18 18 18    Temp: 97.9 F (36.6 C) 97.7 F (36.5 C) 97.8 F (36.6 C)   TempSrc: Oral Oral Oral   SpO2: 97% 93% 96%   Weight:    122.6 kg  Height:       Filed Weights   12/03/18  0644 12/04/18 0537 12/05/18 0619  Weight: 123.4 kg 124.5 kg 122.6 kg   Body mass index is 41.1 kg/m.   Exam  Awake Alert, Oriented X 3, No new F.N deficits, Normal affect [place].AT, mild conjunctival redness in both eyes with some thin crusty material on the sides  Supple Neck,No JVD, No cervical lymphadenopathy appriciated.  Symmetrical Chest wall movement, Good air movement bilaterally, CTAB RRR,No Gallops, Rubs or new Murmurs, No Parasternal Heave +ve B.Sounds, Abd Soft, No tenderness, No organomegaly appriciated, No rebound - guarding or rigidity. No Cyanosis, Clubbing or edema, No new Rash or bruise  LABORATORY DATA: CBC: Recent Labs  Lab 12/01/18 0416 12/04/18 1136  WBC 9.2 9.3  HGB 11.4* 11.2*  HCT 35.6* 35.5*  MCV 89.2 90.3  PLT 238 431    Basic Metabolic Panel: Recent Labs  Lab 12/01/18 0416  NA 138  K 4.3  CL 103  CO2 27  GLUCOSE 108*  BUN 37*  CREATININE 1.06  CALCIUM 9.9  MG 2.0    GFR: Estimated Creatinine Clearance: 80.3 mL/min (by C-G formula based on SCr of 1.06 mg/dL).  Liver Function Tests: Recent Labs  Lab 12/01/18 0416  AST 16  ALT 25  ALKPHOS 88  BILITOT 0.6  PROT 6.3*  ALBUMIN 2.7*   No results for input(s): LIPASE, AMYLASE in the last 168 hours. No results for input(s): AMMONIA in the last 168 hours.  Coagulation Profile: No results for input(s): INR, PROTIME in the last 168 hours.  Cardiac Enzymes: No results for input(s): CKTOTAL, CKMB, CKMBINDEX, TROPONINI in the last 168 hours.  BNP (last 3 results) No results for input(s): PROBNP in the last 8760 hours.  HbA1C: No results for input(s): HGBA1C in the last 72 hours.  CBG: No results for input(s): GLUCAP in the last 168 hours.  Lipid Profile: No results for input(s): CHOL, HDL, LDLCALC, TRIG, CHOLHDL, LDLDIRECT in the last 72 hours.  Thyroid Function Tests: No results for input(s): TSH, T4TOTAL, FREET4, T3FREE, THYROIDAB in the last 72 hours.  Anemia Panel: No  results for input(s): VITAMINB12, FOLATE, FERRITIN, TIBC, IRON, RETICCTPCT in the last 72 hours.  Urine analysis:    Component Value Date/Time   COLORURINE STRAW (A) 12/04/2018 0813   APPEARANCEUR CLEAR 12/04/2018 0813   APPEARANCEUR Clear 01/18/2018 1539   LABSPEC 1.009 12/04/2018 0813   PHURINE 6.0 12/04/2018 0813   GLUCOSEU NEGATIVE 12/04/2018 0813   HGBUR NEGATIVE 12/04/2018 0813   BILIRUBINUR NEGATIVE 12/04/2018 0813   BILIRUBINUR Negative 01/18/2018  [place] 12/04/2018 0813   PROTEINUR NEGATIVE 12/04/2018 0813   UROBILINOGEN 0.2 09/06/2012 0213   NITRITE NEGATIVE 12/04/2018 0813   LEUKOCYTESUR NEGATIVE 12/04/2018 0813    Sepsis Labs: Lactic Acid, Venous    Component Value Date/Time   LATICACIDVEN 1.7 10/01/2018 1833    MICROBIOLOGY: No results found for this or any previous visit (from the past 240 hour(s)).  RADIOLOGY STUDIES/RESULTS: Dg Shoulder Right  Result Date: 12/03/2018 CLINICAL DATA:  Pulling injury 1 week ago with persistent shoulder pain, initial encounter EXAM: RIGHT SHOULDER - 2+ VIEW COMPARISON:  09/05/2018 FINDINGS: Mild degenerative changes of the glenohumeral articulation are seen. No definitive fracture or dislocation is seen. The underlying bony thorax is unremarkable. IMPRESSION: Degenerative change without acute abnormality. Electronically Signed   By: Inez Catalina M.D.   On: 12/03/2018 10:13   Dg Chest Port 1 View  Result Date: 12/04/2018 CLINICAL DATA:  Shortness of breath EXAM: PORTABLE CHEST 1 VIEW COMPARISON:  10/16/2018 FINDINGS: Left-sided PICC with tip at the upper SVC. Pleural thickening and volume loss on the left, stable and partially calcified by CT. Trace pleural fluid or scarring on the right. Cardiomegaly. There is no edema, consolidation, effusion, or pneumothorax. IMPRESSION: 1. No acute finding when compared to priors. 2. Left fibrothorax. Electronically Signed   By: Monte Fantasia M.D.   On: 12/04/2018 08:40   Dg  Abd Portable 1v  Result Date: 11/29/2018 CLINICAL DATA:  Constipation EXAM: PORTABLE ABDOMEN - 1 VIEW COMPARISON:  11/03/2018 FINDINGS: Formed stool throughout most colonic segments. No evidence of small bowel obstruction or rectal impaction. Postoperative stomach. Cholecystectomy clips. Bilateral hip arthroplasty. IMPRESSION: Generalized stool correlating with history of constipation. No evidence of obstruction or rectal impaction. Electronically Signed   By: Monte Fantasia M.D.   On: 11/29/2018 07:35     LOS: 62 days    Signature  Lala Lund M.D on 12/05/2018 at 9:39 AM   -  To page go to [email or web address]

## 2018-12-06 MED ORDER — GABAPENTIN 600 MG PO TABS
300.0000 mg | ORAL_TABLET | Freq: Three times a day (TID) | ORAL | Status: DC
  Administered 2018-12-06 – 2018-12-07 (×4): 300 mg via ORAL
  Filled 2018-12-06 (×4): qty 1

## 2018-12-06 NOTE — Progress Notes (Deleted)
PICC assessment: Pt has finished long term antibiotics. Please consider discontinuation of PICC.

## 2018-12-06 NOTE — Progress Notes (Signed)
Patient has home CPAP and mask.  Patient is able to apply mask with no assistance.

## 2018-12-06 NOTE — Progress Notes (Signed)
   12/06/18 1400  Clinical Encounter Type  Visited With Health care provider  Visit Type Follow-up;Psychological support;Social support  Spiritual Encounters  Spiritual Needs Other (Comment) (card, crayons, and coloring sheets)  Stress Factors  Patient Stress Factors Health changes   Met w/ care RN, said pt was in a lot of pain, so in-person visit might not be best.  Wrote a card for pt, writing hopes for pt to be able to spend time with his grandson and do stained glass windows again (from visit w/ pt a month ago).  Also provided crayons and coloring sheets, one with "hope" and another which resembles a stained glass window.  Gave all items to care RN Leatrice Jewels, she will take in next time she enters rm.  Myra Gianotti resident, [phone]

## 2018-12-06 NOTE — Progress Notes (Signed)
PROGRESS NOTE        PATIENT DETAILS Name: [name] Age: 73 y.o. Sex: male Date of Birth: 02-Jul-1946 Admit Date: 10/01/2018 Admitting Physician Vianne Bulls, MD XIP:[id number], Hollice Espy, MD  Brief Narrative: Patient is a 73 y.o. male with history of A. 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  Found to have a soft tissue infection of the left hip area, acute kidney injury and hyperkalemia.  ID, orthopedics consulted-underwent left hip aspiration on 2/14, infectious disease recommending 6 weeks of IV Invanz. Finished 3/26.   Subjective:  Patient in bed, appears comfortable, denies any headache, no fever, no chest pain or pressure, no shortness of breath , no abdominal pain. No focal weakness.  Mild right shoulder discomfort which is chronic, has worsening left eye pain with mild photophobia along with rash on his left forehead.   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  Was on suppressive Flagyl for Bacteroides fragilis infection-recommendations from ID are for 6 weeks of IV ertapenem-end date of November 25, 2018. He has finished all his antibiotics while in the hospital.  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  Eyedrops to be continued.  Case discussed with  ophthalmologist Dr. Katy Fitch.  Neurontin added for pain control along with home dose narcotic.    AKI: Likely hemodynamically mediated-secondary to soft tissue infection of the left hip, diuretic use.  Resolved with supportive care.    Chronic hypotension.  Intermittent IV fluid, Midodrine TID scheduled.  Asymptomatic.    History of left total hip arthroplasty with subsequent infection with Bacteroides fragilis: Patient was evaluated by infectious disease at [facility] was previously on Flagyl.  Now on IV Invanz (as above)-Flagyl has been discontinued. He has finished all his antibiotics.  Recurrent pleural effusion: Felt to be secondary to hepatic hydrothorax-most recent CT chest on 10/07/2018 shows a small pleural effusion-patient is asymptomatic at this point.  Doubt further work-up required-furthermore he has had a recent extensive work-up including multiple thoracocentesis   Cryptogenic cirrhosis: Prior hepatitis serology done earlier this month was negative-agree with plans outlined in prior discharge summary for outpatient follow-up.  Volume status is stable-he is awake and alert-continue Aldactone  Atrial fibrillation: Rate controlled with Cardizem.Continue Eliquis.  Chronic back and right shoulder pain: Continue Lidoderm patch, Tylenol and as needed narcotics.  Stable repeat x-ray on 12/03/2018.  Bipolar disorder: Stable-continue Lexapro, Neurontin  Constipation: Bowel regimen, exam and x-ray unremarkable, placed on Movantik on 11/28/2018 continue, he has narcotic bowel.  Right shoulder pain: chornic improves with steroid trials..  OSA: Continue CPAP nightly  Morbid obesity  Acute on chronic debility/deconditioning: Has chronic debility/deconditioning at baseline-this is worsened due to acute illness/AKI/left hip infection.  Transfer to skilled nursing when bed is available.  Medically stable.  Insomnia: Been placed on trazodone will add Ambien for better symptom  control.  Still complaining of insomnia.    DVT Prophylaxis: Full dose anticoagulation with Eliquis  Code Status: Full code  Family Communication: No family at bedside.  Disposition Plan: Remain inpatient- SNF on discharge when bed available.  Medically stable.  Unable to transition out of the hospital.  Antimicrobial agents: Anti-infectives (From admission, onward)   Start     Dose/Rate Route Frequency Ordered Stop   12/05/18 1430  valACYclovir (VALTREX) tablet 1,000 mg     1,000 mg Oral Every 8 hours 12/05/18 1411 01/04/19 1359   10/15/18 1500  cefTRIAXone (ROCEPHIN) 2 g in sodium chloride 0.9 % 100 mL IVPB  Status:  Discontinued     2 g 200 mL/hr over 30 Minutes Intravenous Every 24 hours 10/15/18 1434 10/15/18 1447   10/15/18 1500  ertapenem (INVANZ) 1,000 mg in sodium chloride 0.9 % 100 mL IVPB     1 g 200 mL/hr over 30 Minutes Intravenous Every 24 hours 10/15/18 1447 11/25/18 1604   10/08/18 0000  doxycycline (VIBRA-TABS) 100 MG tablet  Status:  Discontinued     100 mg Oral Every 12 hours 10/08/18 0921 11/26/18    10/06/18 1000  doxycycline (VIBRA-TABS) tablet 100 mg  Status:  Discontinued     100 mg Oral Every 12 hours 10/06/18 0726 10/11/18 1328   10/03/18 1800  metroNIDAZOLE (FLAGYL) tablet 500 mg  Status:  Discontinued     500 mg Oral Every 8 hours 10/03/18 1054 10/15/18 1447   10/03/18 0700  vancomycin (VANCOCIN) 2,000 mg in sodium chloride 0.9 % 500 mL IVPB  Status:  Discontinued     2,000 mg 250 mL/hr over 120 Minutes Intravenous Every 36 hours 10/01/18 1942 10/06/18 0726   10/02/18 0200  ceFEPIme (MAXIPIME) 2 g in sodium chloride 0.9 % 100 mL IVPB  Status:  Discontinued     2 g 200 mL/hr over 30 Minutes Intravenous Every 8 hours 10/01/18 1942 10/02/18 1008   10/01/18 1830  vancomycin (VANCOCIN) 2,500 mg in sodium chloride 0.9 % 500 mL IVPB     2,500 mg 250 mL/hr over 120 Minutes Intravenous STAT 10/01/18 1825 10/02/18 0126   10/01/18 1800  ceFEPIme (MAXIPIME) 2  g in sodium chloride 0.9 % 100 mL IVPB     2 g 200 mL/hr over 30 Minutes Intravenous  Once 10/01/18 1747 10/01/18 1903   10/01/18 1800  metroNIDAZOLE (FLAGYL) IVPB 500 mg  Status:  Discontinued     500 mg 100 mL/hr over 60 Minutes Intravenous Every 8 hours 10/01/18 1747 10/03/18 1054   10/01/18 1800  vancomycin (VANCOCIN) IVPB 1000 mg/200 mL premix  Status:  Discontinued     1,000 mg 200 mL/hr over 60 Minutes Intravenous  Once 10/01/18 1747 10/01/18 1825      Procedures: None  CONSULTS: Ortho ID Ophthalmology  Time spent: 15- minutes  MEDICATIONS: Scheduled Meds: . apixaban  5 mg Oral BID  . Chlorhexidine Gluconate Cloth  6 each Topical Daily  . cycloSPORINE  1 drop Both Eyes BID  . diclofenac sodium  2 g Topical BID  . diltiazem  120 mg Oral Daily  . escitalopram  10 mg Oral Daily  . gabapentin  300 mg Oral TID  . lactulose  30 g Oral BID  . lidocaine  1 patch Transdermal Q24H  . Melatonin  6 mg Oral QHS  . midodrine  10 mg Oral TID WC  . multivitamin with minerals  1 tablet Oral Daily  . olopatadine  1 drop Both Eyes BID  . pantoprazole  40 mg Oral Daily  . polyethylene glycol  17 g Oral BID  . Ensure Max Protein  11 oz Oral BID  . senna-docusate  2 tablet Oral BID  . spironolactone  50 mg Oral Daily  . thiamine  100 mg Oral Daily  . valACYclovir  1,000 mg Oral Q8H   Continuous Infusions: . sodium chloride 10 mL/hr at 11/21/18 1900   PRN Meds:.sodium chloride, acetaminophen, albuterol, alum & mag hydroxide-simeth, diltiazem, guaiFENesin-dextromethorphan, hydrOXYzine, menthol-cetylpyridinium, [DISCONTINUED] ondansetron **OR** ondansetron (ZOFRAN) IV, oxyCODONE, sodium chloride flush, sodium phosphate, traZODone, zolpidem   PHYSICAL EXAM: Vital signs: Vitals:   12/05/18 0619 12/05/18 1332 12/05/18 2122 12/06/18 0633  BP: 106/74 (!) 105/59 101/70 112/78  Pulse:  98 (!) 102 97  Resp:  18 18 18   Temp:  99 F (37.2 C) 99 F (37.2 C) 97.9 F (36.6 C)   TempSrc:   Oral Oral  SpO2:  96% 95% 96%  Weight: 122.6 kg     Height:       Filed Weights   12/03/18 0644 12/04/18 0537 12/05/18 0619  Weight: 123.4 kg 124.5 kg 122.6 kg   Body mass index is 41.1 kg/m.   Exam  Awake Alert, Oriented X 3, No new F.N deficits, Normal affect [place].AT, Left .V1 ( cranial nerve 5) dermatome rash developing, mild L>R conjunctival redness Supple Neck,No JVD, No cervical lymphadenopathy appriciated.  Symmetrical Chest wall movement, Good air movement bilaterally, CTAB RRR,No Gallops, Rubs or new Murmurs, No Parasternal Heave +ve B.Sounds, Abd Soft, No tenderness, No organomegaly appriciated, No rebound - guarding or rigidity. No Cyanosis, Clubbing or edema, No new Rash or bruise  LABORATORY DATA: CBC: Recent Labs  Lab 12/01/18 0416 12/04/18 1136  WBC 9.2 9.3  HGB 11.4* 11.2*  HCT 35.6* 35.5*  MCV 89.2 90.3  PLT 238 539    Basic Metabolic Panel: Recent Labs  Lab 12/01/18 0416  NA 138  K 4.3  CL 103  CO2 27  GLUCOSE 108*  BUN 37*  CREATININE 1.06  CALCIUM 9.9  MG 2.0    GFR: Estimated Creatinine Clearance: 80.3 mL/min (by C-G formula based on SCr of 1.06 mg/dL).  Liver Function Tests: Recent Labs  Lab 12/01/18 0416  AST 16  ALT 25  ALKPHOS 88  BILITOT 0.6  PROT 6.3*  ALBUMIN 2.7*   No results for input(s): LIPASE, AMYLASE in the last 168 hours. No results for input(s): AMMONIA in the last 168 hours.  Coagulation Profile: No results for input(s): INR, PROTIME in the last 168 hours.  Cardiac Enzymes: No results for input(s): CKTOTAL, CKMB, CKMBINDEX, TROPONINI in the last 168 hours.  BNP (last 3 results) No results for input(s): PROBNP in the last 8760 hours.  HbA1C: No results for input(s): HGBA1C in the last 72 hours.  CBG: No results for input(s): GLUCAP in the last 168 hours.  Lipid Profile: No results for input(s): CHOL, HDL, LDLCALC, TRIG, CHOLHDL, LDLDIRECT in the last 72 hours.  Thyroid Function  Tests: No results for input(s): TSH, T4TOTAL, FREET4, T3FREE, THYROIDAB in the last 72 hours.  Anemia Panel: No results for input(s): VITAMINB12, FOLATE, FERRITIN, TIBC, IRON, RETICCTPCT in the last 72 hours.  Urine analysis:    Component Value Date/Time   COLORURINE STRAW (A) 12/04/2018 0813   APPEARANCEUR CLEAR 12/04/2018 0813   APPEARANCEUR Clear 01/18/2018 1539   LABSPEC 1.009 12/04/2018 0813   PHURINE  6.0 12/04/2018 0813   GLUCOSEU NEGATIVE 12/04/2018 0813   HGBUR NEGATIVE 12/04/2018 0813   BILIRUBINUR NEGATIVE 12/04/2018 0813   BILIRUBINUR Negative 01/18/2018 1539   KETONESUR NEGATIVE 12/04/2018 0813   PROTEINUR NEGATIVE 12/04/2018 0813   UROBILINOGEN 0.2 09/06/2012 0213   NITRITE NEGATIVE 12/04/2018 0813   LEUKOCYTESUR NEGATIVE 12/04/2018 0813    Sepsis Labs: Lactic Acid, Venous    Component Value Date/Time   LATICACIDVEN 1.7 10/01/2018 1833    MICROBIOLOGY: No results found for this or any previous visit (from the past 240 hour(s)).  RADIOLOGY STUDIES/RESULTS: Dg Shoulder Right  Result Date: 12/03/2018 CLINICAL DATA:  Pulling injury 1 week ago with persistent shoulder pain, initial encounter EXAM: RIGHT SHOULDER - 2+ VIEW COMPARISON:  09/05/2018 FINDINGS: Mild degenerative changes of the glenohumeral articulation are seen. No definitive fracture or dislocation is seen. The underlying bony thorax is unremarkable. IMPRESSION: Degenerative change without acute abnormality. Electronically Signed   By: Inez Catalina M.D.   On: 12/03/2018 10:13   Dg Chest Port 1 View  Result Date: 12/04/2018 CLINICAL DATA:  Shortness of breath EXAM: PORTABLE CHEST 1 VIEW COMPARISON:  10/16/2018 FINDINGS: Left-sided PICC with tip at the upper SVC. Pleural thickening and volume loss on the left, stable and partially calcified by CT. Trace pleural fluid or scarring on the right. Cardiomegaly. There is no edema, consolidation, effusion, or pneumothorax. IMPRESSION: 1. No acute finding when  compared to priors. 2. Left fibrothorax. Electronically Signed   By: Monte Fantasia M.D.   On: 12/04/2018 08:40   Dg Abd Portable 1v  Result Date: 11/29/2018 CLINICAL DATA:  Constipation EXAM: PORTABLE ABDOMEN - 1 VIEW COMPARISON:  11/03/2018 FINDINGS: Formed stool throughout most colonic segments. No evidence of small bowel obstruction or rectal impaction. Postoperative stomach. Cholecystectomy clips. Bilateral hip arthroplasty. IMPRESSION: Generalized stool correlating with history of constipation. No evidence of obstruction or rectal impaction. Electronically Signed   By: Monte Fantasia M.D.   On: 11/29/2018 07:35     LOS: 63 days    Signature  Lala Lund M.D on 12/06/2018 at 8:31 AM   -  To page go to [email or web address]

## 2018-12-06 NOTE — Social Work (Addendum)
11:54am- Pt able to go to [place] when medically cleared, paperwork being sent to CSW for pt to complete. Will give to bedside RN for pt to complete in room.   10:04am- Spoke with bedside RN during morning progression- provided her with number for admissions at [facility] to discuss needs for pt in order to d/c. Aware pt currently on multiple contact precautions- await clarification prior to d/c.    Westley Hummer, MSW, [place] Work [phone]

## 2018-12-06 NOTE — NC FL2 (Signed)
[place] LEVEL OF CARE SCREENING TOOL     IDENTIFICATION  Patient Name: [name] Birthdate: 1946/06/07 Sex: male Admission Date (Current Location): 10/01/2018  [facility] and [place] Number:  [employer] and Address:  [facility] [place], [employer] [address], [place], [place] [address]      Provider Number: [id number]  Attending Physician Name and Address:  Thurnell Lose, MD  Relative Name and Phone Number:  [name] daughter, [phone]    Current Level of Care: Hospital Recommended Level of Care: [place] Prior Approval Number:    Date Approved/Denied:   PASRR Number: [id number]  Discharge Plan: SNF    Current Diagnoses: Patient Active Problem List   Diagnosis Date Noted  . Goals of care, counseling/discussion   . Palliative care encounter   . Hyperkalemia 10/01/2018  . Other cirrhosis of liver ([place]) 10/01/2018  . Chronic back pain 10/01/2018  . Cellulitis of left hip   . 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 Physical debility 09/04/2018  . Pleural effusion, bilateral   . Hypertensive urgency 08/17/2018  . Pleural effusion on left 08/17/2018  . Chronic diastolic CHF (congestive heart failure) ([place]) 08/17/2018  . OSA on CPAP 08/17/2018  . Unspecified atrial fibrillation ([place])   . Delirium   . Depression   . OSA (obstructive sleep apnea)   . Obesity, Class III, BMI 40-49.9 (morbid obesity) ([place])   . Atrial fibrillation with RVR ([place]) 04/16/2018  . Morbid obesity with BMI of 50.0-59.9, adult ([place]) 04/16/2018  . AKI (acute kidney injury) ([place]) 04/16/2018  . Visual hallucination 04/16/2018  . SIRS (systemic inflammatory response syndrome) ([place]) 04/16/2018  . Anemia of chronic disease 04/16/2018  . Left hip postoperative wound infection 03/15/2018  . Prosthetic joint infection of left hip ([place]) 03/15/2018  . Femur fracture, left ([place]) 02/15/2018  . Anemia 02/15/2018  . Femur fracture ([place])  02/15/2018  . 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 SOB (shortness of breath) 03/31/2014  . Hypertension   . Hyponatremia 03/18/2012  . 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[place]) 03/17/2012    Orientation RESPIRATION BLADDER Height & Weight     Self, Time, Situation, Place  Normal, Other (Comment)(Has home cpap) Continent Weight: 270 lb 4.5 oz (122.6 kg) Height:  5\' 8"  (172.7 cm)  BEHAVIORAL SYMPTOMS/MOOD NEUROLOGICAL BOWEL NUTRITION STATUS      Incontinent Diet(Please see DC Summary)  AMBULATORY STATUS COMMUNICATION OF NEEDS Skin   Extensive Assist Verbally Surgical wounds(Closed incision on hip)                       Personal Care Assistance Level of Assistance  Bathing, Feeding, Dressing Bathing Assistance: Maximum assistance Feeding assistance: Independent Dressing Assistance: Limited assistance     Functional Limitations Info  Sight, Hearing, Speech Sight Info: Impaired Hearing Info: Impaired Speech Info: Adequate    SPECIAL CARE FACTORS FREQUENCY  PT (By licensed PT), OT (By licensed OT)     PT Frequency: 5x/week OT Frequency: 3x/week            Contractures Contractures Info: Not present    Additional Factors Info  Code Status, Allergies, Contact Precautions  Code Status Info: Full Allergies Info: Bee Venom, Nickel  Contact Precautions: Airborne/Contact          Current Medications (12/06/2018):  This is the current hospital active medication list Current Facility-Administered Medications  Medication Dose Route Frequency Provider Last Rate Last Dose  .  0.9 %  sodium chloride infusion   Intravenous PRN Aline August, MD 10 mL/hr at 11/21/18 1900    . acetaminophen (TYLENOL) tablet 650 mg  650 mg Oral Q6H PRN Thurnell Lose, MD   650 mg at 12/06/18 0811  . albuterol (PROVENTIL) (2.5 MG/3ML) 0.083% nebulizer solution 2.5 mg  2.5 mg Inhalation Q6H PRN Opyd, Ilene Qua, MD      . alum & mag hydroxide-simeth (MAALOX/MYLANTA)  200-200-20 MG/5ML suspension 30 mL  30 mL Oral Q6H PRN Thurnell Lose, MD   30 mL at 12/01/18 2212  . apixaban (ELIQUIS) tablet 5 mg  5 mg Oral BID Lore, Melissa A, RPH   5 mg at 12/06/18 1047  . Chlorhexidine Gluconate Cloth 2 % PADS 6 each  6 each Topical Daily Jonetta Osgood, MD   6 each at 12/06/18 1126  . cycloSPORINE (RESTASIS) 0.05 % ophthalmic emulsion 1 drop  1 drop Both Eyes BID Opyd, Ilene Qua, MD   1 drop at 12/06/18 1046  . diclofenac sodium (VOLTAREN) 1 % transdermal gel 2 g  2 g Topical BID Thurnell Lose, MD   2 g at 12/06/18 1045  . diltiazem (CARDIZEM CD) 24 hr capsule 120 mg  120 mg Oral Daily Thurnell Lose, MD   120 mg at 12/06/18 1100  . diltiazem (CARDIZEM) injection 10 mg  10 mg Intravenous Q6H PRN Thurnell Lose, MD      . escitalopram (LEXAPRO) tablet 10 mg  10 mg Oral Daily Opyd, Ilene Qua, MD   10 mg at 12/06/18 1046  . gabapentin (NEURONTIN) tablet 300 mg  300 mg Oral TID Thurnell Lose, MD   300 mg at 12/06/18 1046  . guaiFENesin-dextromethorphan (ROBITUSSIN DM) 100-10 MG/5ML syrup 5 mL  5 mL Oral Q4H PRN Jonetta Osgood, MD   5 mL at 11/23/18 2157  . hydrOXYzine (ATARAX/VISTARIL) tablet 25 mg  25 mg Oral Daily PRN Gardiner Barefoot, NP   25 mg at 12/05/18 2153  . lactulose (CHRONULAC) 10 GM/15ML solution 30 g  30 g Oral BID Jonetta Osgood, MD   30 g at 12/05/18 2113  . lidocaine (LIDODERM) 5 % 1 patch  1 patch Transdermal Q24H Opyd, Ilene Qua, MD   1 patch at 12/06/18 [phone]  . Melatonin TABS 6 mg  6 mg Oral QHS Opyd, Ilene Qua, MD   6 mg at 12/05/18 2155  . menthol-cetylpyridinium (CEPACOL) lozenge 3 mg  1 lozenge Oral PRN Thurnell Lose, MD   3 mg at 12/03/18 1134  . midodrine (PROAMATINE) tablet 10 mg  10 mg Oral TID WC Thurnell Lose, MD   10 mg at 12/06/18 8588  . multivitamin with minerals tablet 1 tablet  1 tablet Oral Daily Jonetta Osgood, MD   1 tablet at 12/06/18 1046  . olopatadine (PATANOL) 0.1 % ophthalmic solution 1  drop  1 drop Both Eyes BID Thurnell Lose, MD   1 drop at 12/06/18 1045  . ondansetron (ZOFRAN) injection 4 mg  4 mg Intravenous Q6H PRN Opyd, Ilene Qua, MD   4 mg at 12/06/18 0013  . oxyCODONE (Oxy IR/ROXICODONE) immediate release tablet 5 mg  5 mg Oral Q4H PRN Thurnell Lose, MD   5 mg at 12/06/18 5027  . pantoprazole (PROTONIX) EC tablet 40 mg  40 mg Oral Daily Opyd, Ilene Qua, MD   40 mg at 12/06/18 1045  . polyethylene glycol (MIRALAX / GLYCOLAX) packet 17  g  17 g Oral BID Aline August, MD   17 g at 12/05/18 2109  . protein supplement (ENSURE MAX) liquid  11 oz Oral BID Aline August, MD   11 oz at 12/06/18 1047  . senna-docusate (Senokot-S) tablet 2 tablet  2 tablet Oral BID Aline August, MD   2 tablet at 12/06/18 1046  . sodium chloride flush (NS) 0.9 % injection 10-40 mL  10-40 mL Intracatheter PRN Aline August, MD   10 mL at 12/02/18 1424  . sodium phosphate (FLEET) 7-19 GM/118ML enema 1 enema  1 enema Rectal Daily PRN Aline August, MD      . spironolactone (ALDACTONE) tablet 50 mg  50 mg Oral Daily Starla Link, Kshitiz, MD   50 mg at 12/06/18 1046  . thiamine (VITAMIN B-1) tablet 100 mg  100 mg Oral Daily Opyd, Ilene Qua, MD   100 mg at 12/06/18 1046  . traZODone (DESYREL) tablet 50 mg  50 mg Oral QHS PRN Jonetta Osgood, MD   50 mg at 12/05/18 2155  . valACYclovir (VALTREX) tablet 1,000 mg  1,000 mg Oral Q8H Lala Lund K, MD   1,000 mg at 12/06/18 5825  . zolpidem (AMBIEN) tablet 5 mg  5 mg Oral QHS PRN Thurnell Lose, MD   5 mg at 12/05/18 2155     Discharge Medications: Please see discharge summary for a list of discharge medications.  Relevant Imaging Results:  Relevant Lab Results:   Additional Information SS# [id number].  Patient uses home CPAP - settings are 16 breaths/min; 21%  Alexander Mt, LCSWA

## 2018-12-06 NOTE — Progress Notes (Signed)
Physical Therapy Treatment Patient Details Name: [name] MRN: [id number] DOB: [birth year] Today's Date: 12/06/2018    History of Present Illness 73 y.o. male admitted 10/01/18 with generalized weakness, falls and SOB. Workup revealed afib with recurrent R-side pleural effusion; AKI. Pt with L hip cellulitis with chronic L hip prosthetic joint infection; s/p joint aspiration 2/14. PMH includes HTN, afib on Eliquis, OSA, bipolar disorder, obesity, L THA with infection and multiple prolonged hospitalizations.    PT Comments    Patient seen for mobility progression. Pt requires mod A +2 for sit to stand from elevated surface and max A +2 from recliner. Pt tolerated ambulating 6 ft this session before fatigued. Continue to progress as tolerated with anticipated d/c to SNF for further skilled PT services.    Follow Up Recommendations  SNF     Equipment Recommendations  Wheelchair (measurements PT);Wheelchair cushion (measurements PT);Hospital bed    Recommendations for Other Services       Precautions / Restrictions Precautions Precautions: Fall;Posterior Hip;Other (comment)(+ orthostatic hypotension; airborne/contact precautions) Precaution Booklet Issued: No Precaution Comments: h/o falls, very weak, per pt report posterior hip precautions. Restrictions Weight Bearing Restrictions: No    Mobility  Bed Mobility Overal bed mobility: Needs Assistance Bed Mobility: Supine to Sit     Supine to sit: Mod assist     General bed mobility comments: assist to scoot hips to EOB with use of bed pad and to elevate trunk into sitting; use of rail and HOB elevated  Transfers Overall transfer level: Needs assistance Equipment used: Rolling walker (2 wheeled) Transfers: Sit to/from Stand Sit to Stand: Mod assist;+2 physical assistance;From elevated surface;Max assist         General transfer comment: cues for safe hand placement; assist to power up into standing with mod A +2 from  elevated surface and max A +2 from recliner  Ambulation/Gait Ambulation/Gait assistance: +2 safety/equipment;Min assist Gait Distance (Feet): 6 Feet Assistive device: Rolling walker (2 wheeled) Gait Pattern/deviations: Decreased step length - left;Decreased step length - right;Decreased stride length;Decreased dorsiflexion - right;Decreased dorsiflexion - left;Shuffle;Trunk flexed Gait velocity: decreased   General Gait Details: cues for breathing technique, upright posture, and increased stride lengths    Stairs             Wheelchair Mobility    Modified Rankin (Stroke Patients Only)       Balance Overall balance assessment: Needs assistance Sitting-balance support: Feet supported;Single extremity supported Sitting balance-[name]: Fair     Standing balance support: Bilateral upper extremity supported Standing balance-[name]: Poor Standing balance comment: Reliant on UE support                             Cognition Arousal/Alertness: Awake/alert Behavior During Therapy: Anxious Overall Cognitive Status: Within Functional Limits for tasks assessed                                 General Comments: pt becomes very anxious with mobility      Exercises      General Comments General comments (skin integrity, edema, etc.): rash on L side of face and left eye      Pertinent Vitals/Pain Pain Assessment: Faces Faces Pain Scale: Hurts little more Pain Location: back Pain Descriptors / Indicators: Grimacing;Guarding Pain Intervention(s): Limited activity within patient's tolerance;Monitored during session;Repositioned    Home Living  Prior Function            PT Goals (current goals can now be found in the care plan section) Progress towards PT goals: Progressing toward goals    Frequency    Min 3X/week      PT Plan Current plan remains appropriate    Co-evaluation               AM-PAC PT "6 Clicks" Mobility   Outcome Measure  Help needed turning from your back to your side while in a flat bed without using bedrails?: A Lot Help needed moving from lying on your back to sitting on the side of a flat bed without using bedrails?: A Lot Help needed moving to and from a bed to a chair (including a wheelchair)?: A Lot Help needed standing up from a chair using your arms (e.g., wheelchair or bedside chair)?: A Lot Help needed to walk in hospital room?: A Lot Help needed climbing 3-5 steps with a railing? : Total 6 Click Score: 11    End of Session Equipment Utilized During Treatment: Gait belt Activity Tolerance: Patient tolerated treatment well Patient left: with call bell/phone within reach;in chair;with chair alarm set Nurse Communication: Mobility status PT Visit Diagnosis: Other abnormalities of gait and mobility (R26.89);Muscle weakness (generalized) (M62.81);Difficulty in walking, not elsewhere classified (R26.2);History of falling (Z91.81)     Time: 3716-9678 PT Time Calculation (min) (ACUTE ONLY): 35 min  Charges:  $Gait Training: 23-37 mins                     Earney Navy, PTA Acute Rehabilitation Services Pager: [phone] Office: [phone]     Darliss Cheney 12/06/2018, 4:30 PM

## 2018-12-06 NOTE — TOC Progression Note (Signed)
Transition of Care [facility]) - Progression Note    Patient Details  Name: [name] MRN: [id number] Date of Birth: 09-03-1945  [facility]) CM/SW [place], [place] Phone Number: 12/06/2018, 2:11 PM  Clinical Narrative:    Paperwork completed by pt, faxed securely to [facility]. Await medical stability for transfer.    Expected Discharge Plan: [place]    Expected Discharge Plan and Services Expected Discharge Plan: Skilled Nursing Facility In-house Referral: Clinical Social Work Discharge Planning Services: CM Consult     Expected Discharge Date: 11/26/18                         Social Determinants of Health (SDOH) Interventions    Readmission Risk Interventions Readmission Risk Prevention Plan 09/11/2018  Transportation Screening Complete  Medication Review Press photographer) Complete  HRI or Home Care Consult Complete  Some recent data might be hidden

## 2018-12-07 MED ORDER — OXYCODONE HCL 5 MG PO TABS: 5.0000 mg | ORAL_TABLET | Freq: Four times a day (QID) | ORAL | 0 refills | Status: DC | PRN

## 2018-12-07 MED ORDER — VALACYCLOVIR HCL 1 G PO TABS: 1000.0000 mg | ORAL_TABLET | Freq: Three times a day (TID) | ORAL | Status: DC

## 2018-12-07 MED ORDER — MIDODRINE HCL 10 MG PO TABS: 10.0000 mg | ORAL_TABLET | Freq: Three times a day (TID) | ORAL | Status: DC

## 2018-12-07 MED ORDER — GABAPENTIN 300 MG PO CAPS: 300.0000 mg | ORAL_CAPSULE | Freq: Three times a day (TID) | ORAL | Status: DC

## 2018-12-07 MED ORDER — ENSURE MAX PROTEIN PO LIQD: 11.0000 [oz_av] | Freq: Two times a day (BID) | ORAL | Status: DC

## 2018-12-07 MED ORDER — LACTULOSE 10 GM/15ML PO SOLN: 30.0000 g | Freq: Two times a day (BID) | ORAL | 0 refills | Status: DC

## 2018-12-07 MED ORDER — DILTIAZEM HCL ER COATED BEADS 120 MG PO CP24: 120.0000 mg | ORAL_CAPSULE | Freq: Every day | ORAL | Status: DC

## 2018-12-07 NOTE — Progress Notes (Signed)
Report called to [place] at [facility].

## 2018-12-07 NOTE — Discharge Summary (Signed)
[name] ZHY:[id number] DOB: 14-Feb-1946 DOA: 10/01/2018  PCP: [name], MD  Admit date: 10/01/2018  Discharge date: 12/07/2018  Admitted From: Home   Disposition:  SNF   Recommendations for Outpatient Follow-up:   Follow up with PCP in 1-2 weeks  PCP Please obtain BMP/CBC, 2 view CXR in 1week,  (see Discharge instructions)   PCP Please follow up on the following pending results:    Home Health: None   Equipment/Devices: None  Consultations:  Ortho ID Ophthalmology Discharge Condition: Stable CODE STATUS: Full   Diet Recommendation: Heart Healthy     CC - L.Hip pain   Brief history of present illness from the day of admission and additional interim summary    Patient is a 73 y.o. male with history of A. 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  Found to have a soft tissue infection of the left hip area, acute kidney injury and hyperkalemia.  ID, orthopedics consulted-underwent left hip aspiration on 2/14, infectious disease recommending 6 weeks of IV Invanz. Finished 3/26.                                                                  Hospital Course    Left hip cellulitis with chronic left hip prosthetic joint infection: Evaluated by ID and orthopedics, underwent IR guided joint aspiration on 2/14-cultures negative.   Was on suppressive Flagyl for Bacteroides fragilis infection-recommendations from ID are for 6 weeks of IV ertapenem-end date of November 25, 2018. He has finished all his antibiotics while in the hospital.  I recommended that post discharge to follow with his infectious disease physician at [facility] in 1 to 2 weeks or with local ID physician Dr. Drucilla Schmidt and his orthopedic surgeon Dr. Ricki Rodriguez in 1 to 2 weeks.  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  Eyedrops to be continued.  Case discussed with ophthalmologist Dr. Katy Fitch.  Neurontin added for pain control along with home dose narcotic.    AKI: Likely hemodynamically mediated-secondary to soft tissue infection of the left hip, diuretic use.  Resolved with supportive care.    Chronic hypotension.  He is stable on midodrine.  Asymptomatic.    Recurrent pleural effusion: Felt to be secondary to hepatic hydrothorax-most recent CT chest on 10/07/2018 shows a  small pleural effusion-patient is asymptomatic at this point.  Doubt further work-up required-furthermore he has had a recent extensive work-up including multiple thoracocentesis, outpatient follow-up with pulmonary in 3 to 4 weeks.  Cryptogenic cirrhosis: Prior hepatitis serology done earlier this month was negative-agree with plans outlined in prior discharge summary for outpatient follow-up.  Volume status is stable-he is awake and alert-continue Aldactone  Atrial fibrillation: Rate controlled with Cardizem.Continue Eliquis.  Chronic back and right shoulder pain: Continue Lidoderm patch, Tylenol and as needed narcotics.  Stable repeat x-ray on 12/03/2018.  Bipolar disorder: Stable-continue Lexapro, Neurontin  Constipation: Bowel regimen, exam and x-ray unremarkable, placed on Movantik on 11/28/2018 continue, he has narcotic bowel.  Right shoulder pain: chornic improves with steroid  trials..  OSA: Continue CPAP nightly  Morbid obesity  Acute on chronic debility/deconditioning: Has chronic debility/deconditioning at baseline-this is worsened due to acute illness/AKI/left hip infection.  Transfer to skilled nursing .  Medically stable.     Discharge diagnosis     Principal Problem:   Prosthetic joint infection of left hip (HCC) Active Problems:   Left hip postoperative wound infection   AKI (acute kidney injury) (HCC)   Depression   Pleural effusion on left   OSA on CPAP   Unspecified atrial fibrillation (HCC)   Physical debility   Hyperkalemia   Other cirrhosis of liver (HCC)   Chronic back pain   Goals of care, counseling/discussion   Palliative care encounter    Discharge instructions    Discharge Instructions    Diet - low sodium heart healthy   Complete by:  As directed    Discharge instructions   Complete by:  As directed    Follow with Primary MD [name], MD in 7 days   Get CBC, CMP, Magnesium   checked  by Primary MD or SNF MD in 5-7 days   Activity: As tolerated with Full fall precautions use walker/cane & assistance as needed  Disposition SNF  Diet: Heart Healthy with strict 1.2 L/day fluid restriction.   Special Instructions: If you have smoked or chewed Tobacco  in the last 2 yrs please stop smoking, stop any regular Alcohol  and or any Recreational drug use.  On your next visit with your primary care physician please Get Medicines reviewed and adjusted.  Please request your Prim.MD to go over all Hospital Tests and Procedure/Radiological results at the follow up, please get all Hospital records sent to your Prim MD by signing hospital release before you go home.  If you experience worsening of your admission symptoms, develop shortness of breath, life threatening emergency, suicidal or homicidal thoughts you must seek medical attention immediately by calling 911 or calling your MD immediately  if symptoms less  severe.  You Must read complete instructions/literature along with all the possible adverse reactions/side effects for all the Medicines you take and that have been prescribed to you. Take any new Medicines after you have completely understood and accpet all the possible adverse reactions/side effects.   Increase activity slowly   Complete by:  As directed    Increase activity slowly   Complete by:  As directed       Discharge Medications   Allergies as of 12/07/2018      Reactions   Bee Venom Swelling   Nickel Rash      Medication List    STOP taking these medications   furosemide 20 MG tablet Commonly known as:  LASIX   metoprolol tartrate 25 MG tablet  Commonly known as:  LOPRESSOR   metroNIDAZOLE 500 MG tablet Commonly known as:  FLAGYL   potassium chloride SA 20 MEQ tablet Commonly known as:  K-DUR,KLOR-CON     TAKE these medications   acetaminophen 325 MG tablet Commonly known as:  TYLENOL Take 2 tablets (650 mg total) by mouth every 6 (six) hours as needed for mild pain, fever or headache.   albuterol 108 (90 Base) MCG/ACT inhaler Commonly known as:  PROVENTIL HFA;VENTOLIN HFA Inhale 1-2 puffs into the lungs every 6 (six) hours as needed for wheezing or shortness of breath.   apixaban 5 MG Tabs tablet Commonly known as:  ELIQUIS Take 1 tablet (5 mg total) by mouth 2 (two) times daily.   calcium carbonate 500 MG chewable tablet Commonly known as:  TUMS - dosed in mg elemental calcium Chew 1,000 mg by mouth 3 (three) times daily as needed for indigestion.   carboxymethylcellulose 0.5 % Soln Commonly known as:  Refresh Tears Place 1 drop into both eyes daily as needed (dry eye/irritation).   CENTRUM ADULTS PO Take 1 tablet by mouth every evening.   cycloSPORINE 0.05 % ophthalmic emulsion Commonly known as:  RESTASIS Place 1 drop into both eyes 2 (two) times daily.   diclofenac sodium 1 % Gel Commonly known as:  VOLTAREN Apply 2 g topically 2 (two)  times daily.   diltiazem 120 MG 24 hr capsule Commonly known as:  CARDIZEM CD Take 1 capsule (120 mg total) by mouth daily. What changed:    medication strength  how much to take   Ensure Max Protein Liqd Take 330 mLs (11 oz total) by mouth 2 (two) times daily.   escitalopram 10 MG tablet Commonly known as:  LEXAPRO Take 1 tablet (10 mg total) by mouth daily.   ferrous sulfate 325 (65 FE) MG tablet Take 1 tablet (325 mg total) by mouth 3 (three) times daily with meals.   gabapentin 300 MG capsule Commonly known as:  NEURONTIN Take 1 capsule (300 mg total) by mouth 3 (three) times daily. What changed:    medication strength  how much to take   hydrOXYzine 25 MG tablet Commonly known as:  ATARAX/VISTARIL Take 1 tablet (25 mg total) by mouth daily as needed for anxiety. What changed:    when to take this  reasons to take this   lactulose 10 GM/15ML solution Commonly known as:  CHRONULAC Take 45 mLs (30 g total) by mouth 2 (two) times daily.   lidocaine 5 % Commonly known as:  LIDODERM Place 1 patch onto the skin daily. Remove & Discard patch within 12 hours or as directed by MD   Melatonin 3 MG Tabs Take 2 tablets (6 mg total) by mouth at bedtime.   midodrine 10 MG tablet Commonly known as:  PROAMATINE Take 1 tablet (10 mg total) by mouth 3 (three) times daily with meals.   oxyCODONE 5 MG immediate release tablet Commonly known as:  Oxy IR/ROXICODONE Take 1 tablet (5 mg total) by mouth every 6 (six) hours as needed for moderate pain, severe pain or breakthrough pain.   pantoprazole 40 MG tablet Commonly known as:  PROTONIX Take 1 tablet (40 mg total) by mouth daily.   polyethylene glycol packet Commonly known as:  MIRALAX / GLYCOLAX Take 17 g by mouth daily as needed for mild constipation.   rOPINIRole 0.25 MG tablet Commonly known as:  REQUIP Take 1 tablet (0.25 mg total) by mouth at bedtime.   senna-docusate 8.6-50  MG tablet Commonly known as:   Senokot-S Take 2 tablets by mouth 2 (two) times daily.   spironolactone 50 MG tablet Commonly known as:  ALDACTONE Take 100mg  every morning and 50mg  every evening   thiamine 100 MG tablet Take 100 mg by mouth daily.   traZODone 50 MG tablet Commonly known as:  DESYREL Take 1 tablet (50 mg total) by mouth at bedtime as needed for sleep.   valACYclovir 1000 MG tablet Commonly known as:  VALTREX Take 1 tablet (1,000 mg total) by mouth every 8 (eight) hours.   Vitamin D3 50 MCG (2000 UT) Tabs Take 2,000 Units by mouth every evening.       Follow-up Information    Icard, Bradley L, DO. Schedule an appointment as soon as possible for a visit in 1 week(s).   Specialty:  Pulmonary Disease Contact information: [place] [address] [phone]        [name], MD. Schedule an appointment as soon as possible for a visit in 1 week(s).   Specialty:  Family Medicine Contact information: [place] [address] [phone]        Herminio Commons, MD. Schedule an appointment as soon as possible for a visit in 2 week(s).   Specialty:  Cardiology Contact information: [place] [address] [phone]        Gaynelle Arabian, MD. Schedule an appointment as soon as possible for a visit in 1 week(s).   Specialty:  Orthopedic Surgery Contact information: [address] [place] [address] [place] [address] [phone]           Major procedures and Radiology Reports - PLEASE review detailed and final reports thoroughly  -         Dg Shoulder Right  Result Date: 12/03/2018 CLINICAL DATA:  Pulling injury 1 week ago with persistent shoulder pain, initial encounter EXAM: RIGHT SHOULDER - 2+ VIEW COMPARISON:  09/05/2018 FINDINGS: Mild degenerative changes of the glenohumeral articulation are seen. No definitive fracture or dislocation is seen. The underlying bony thorax is unremarkable. IMPRESSION: Degenerative  change without acute abnormality. Electronically Signed   By: Inez Catalina M.D.   On: 12/03/2018 10:13   Dg Chest Port 1 View  Result Date: 12/04/2018 CLINICAL DATA:  Shortness of breath EXAM: PORTABLE CHEST 1 VIEW COMPARISON:  10/16/2018 FINDINGS: Left-sided PICC with tip at the upper SVC. Pleural thickening and volume loss on the left, stable and partially calcified by CT. Trace pleural fluid or scarring on the right. Cardiomegaly. There is no edema, consolidation, effusion, or pneumothorax. IMPRESSION: 1. No acute finding when compared to priors. 2. Left fibrothorax. Electronically Signed   By: Monte Fantasia M.D.   On: 12/04/2018 08:40   Dg Abd Portable 1v  Result Date: 11/29/2018 CLINICAL DATA:  Constipation EXAM: PORTABLE ABDOMEN - 1 VIEW COMPARISON:  11/03/2018 FINDINGS: Formed stool throughout most colonic segments. No evidence of small bowel obstruction or rectal impaction. Postoperative stomach. Cholecystectomy clips. Bilateral hip arthroplasty. IMPRESSION: Generalized stool correlating with history of constipation. No evidence of obstruction or rectal impaction. Electronically Signed   By: Monte Fantasia M.D.   On: 11/29/2018 07:35    Micro Results     No results found for this or any previous visit (from the past 240 hour(s)).  Today   Subjective    [name] today has no headache,no chest abdominal pain,no new weakness tingling or numbness, feels much better  Objective   Blood pressure 104/77, pulse (!) 119, temperature 98.6 F (37 C), temperature source Oral, resp. rate 20, height 5\' 8"  (1.727 m), weight 122.3 kg, SpO2 98 %.   Intake/Output Summary (Last 24 hours) at 12/07/2018 0806 Last data filed at 12/07/2018 0502 Gross per 24 hour  Intake 1520 ml  Output 2225 ml  Net -705 ml    Exam  Awake Alert, Oriented x 3, No new F.N deficits, Normal affect [place].AT,PERRAL, Left .V1 ( cranial nerve 5) dermatome rash developing, mild L>R conjunctival redness Supple Neck,No  JVD, No cervical lymphadenopathy appriciated.  Symmetrical Chest wall movement, Good air movement bilaterally, CTAB RRR,No Gallops,Rubs or new Murmurs, No Parasternal Heave +ve B.Sounds, Abd Soft, Non tender, No organomegaly appriciated, No rebound -guarding or rigidity. No Cyanosis, Clubbing or edema, No new Rash or bruise   Data Review   CBC w Diff:  Lab Results  Component Value Date   WBC 9.3 12/04/2018   HGB 11.2 (L) 12/04/2018   HCT 35.5 (L) 12/04/2018   PLT 237 12/04/2018   LYMPHOPCT 28 11/17/2018   MONOPCT 13 11/17/2018   EOSPCT 4 11/17/2018   BASOPCT 1 11/17/2018    CMP:  Lab Results  Component Value Date   NA 138 12/01/2018   K 4.3 12/01/2018   CL 103 12/01/2018   CO2 27 12/01/2018   BUN 37 (H) 12/01/2018   CREATININE 1.06 12/01/2018   PROT 6.3 (L) 12/01/2018   ALBUMIN 2.7 (L) 12/01/2018   BILITOT 0.6 12/01/2018   ALKPHOS 88 12/01/2018   AST 16 12/01/2018   ALT 25 12/01/2018  .   Total Time in preparing paper work, data evaluation and todays exam - 18 minutes  Lala Lund M.D on 12/07/2018 at 8:06 AM  [facility]   Office  [phone]

## 2018-12-07 NOTE — Progress Notes (Signed)
PT Cancellation Note  Patient Details Name: [name] MRN: [id number] DOB: 09-18-1945   Cancelled Treatment:    Reason Eval/Treat Not Completed: Other (comment); RN reports pending d/c with transport for this morning.  Will defer PT to save PPE and due to impending D/C.   [name] 12/07/2018, 11:47 AM  [name], [place] [phone] 12/07/2018

## 2018-12-07 NOTE — TOC Transition Note (Addendum)
Transition of Care [facility]) - CM/SW Discharge Note   Patient Details  Name: [name] MRN: [id number] Date of Birth: 11-11-1945  [facility] Care [facility] CM/SW Contact:  Alberteen Sam, LCSW Phone Number: 12/07/2018, 9:02 AM   Clinical Narrative:     Patient will DC to: [place] Anticipated DC date: 12/07/2018 Family notified: [name] (spouse) and [name] (daughter) Transport by: Corey Harold  Per MD patient ready for DC to [facility] . RN, patient, patient's family, and facility notified of DC. Discharge Summary sent to facility. RN given number for report [phone] Room 123A. DC packet on chart. Ambulance transport requested for patient for 11:00 am.  CSW signing off.  [place], [place]   Final next level of care: Skilled Nursing Facility Barriers to Discharge: No Barriers Identified   Patient Goals and CMS Choice Patient states their goals for this hospitalization and ongoing recovery are:: to go to rehab then go home CMS Medicare.gov Compare Post Acute Care list provided to:: Patient Choice offered to / list presented to : Patient  Discharge Placement PASRR number recieved: 11/26/18            Patient [facility] bed at: [facility]) Patient to be transferred to facility by: [place] Name of family member notified: spouse [name] and daughter [name] Patient and family notified of of transfer: 12/07/18  Discharge Plan and Services In-house Referral: Clinical Social Work Discharge Planning Services: [employer] Consult            DME Arranged: N/A DME Agency: NA HH Arranged: NA HH Agency: NA   Social Determinants of Health (SDOH) Interventions     Readmission Risk Interventions Readmission Risk Prevention Plan 09/11/2018  Transportation Screening Complete  Medication Review Press photographer) Complete  HRI or Home Care Consult Complete  Some recent data might be hidden

## 2019-07-12 ENCOUNTER — Emergency Department (HOSPITAL_COMMUNITY): Payer: Medicare Other

## 2019-07-12 ENCOUNTER — Encounter (HOSPITAL_COMMUNITY): Payer: Self-pay | Admitting: *Deleted

## 2019-07-12 ENCOUNTER — Emergency Department (HOSPITAL_COMMUNITY)
Admission: EM | Admit: 2019-07-12 | Discharge: 2019-07-12 | Disposition: A | Discharge status: Home / Self Care | Payer: Medicare Other | Attending: Emergency Medicine | Admitting: Emergency Medicine

## 2019-07-12 ENCOUNTER — Other Ambulatory Visit: Payer: Self-pay

## 2019-07-12 DIAGNOSIS — R197 Diarrhea, unspecified: Secondary | ICD-10-CM | POA: Diagnosis not present

## 2019-07-12 DIAGNOSIS — Z20828 Contact with and (suspected) exposure to other viral communicable diseases: Secondary | ICD-10-CM | POA: Insufficient documentation

## 2019-07-12 DIAGNOSIS — N289 Disorder of kidney and ureter, unspecified: Secondary | ICD-10-CM

## 2019-07-12 DIAGNOSIS — R05 Cough: Secondary | ICD-10-CM | POA: Insufficient documentation

## 2019-07-12 DIAGNOSIS — I5032 Chronic diastolic (congestive) heart failure: Secondary | ICD-10-CM | POA: Insufficient documentation

## 2019-07-12 DIAGNOSIS — Z79899 Other long term (current) drug therapy: Secondary | ICD-10-CM | POA: Insufficient documentation

## 2019-07-12 DIAGNOSIS — E875 Hyperkalemia: Secondary | ICD-10-CM | POA: Diagnosis not present

## 2019-07-12 DIAGNOSIS — Z87891 Personal history of nicotine dependence: Secondary | ICD-10-CM | POA: Diagnosis not present

## 2019-07-12 DIAGNOSIS — I110 Hypertensive heart disease with heart failure: Secondary | ICD-10-CM | POA: Diagnosis not present

## 2019-07-12 DIAGNOSIS — Z7901 Long term (current) use of anticoagulants: Secondary | ICD-10-CM | POA: Diagnosis not present

## 2019-07-12 DIAGNOSIS — R059 Cough, unspecified: Secondary | ICD-10-CM

## 2019-07-12 DIAGNOSIS — R0602 Shortness of breath: Secondary | ICD-10-CM | POA: Diagnosis present

## 2019-07-12 HISTORY — DX: Unspecified atrial fibrillation: I48.91

## 2019-07-12 HISTORY — DX: Hypotension, unspecified: I95.9

## 2019-07-12 HISTORY — DX: Heart failure, unspecified: I50.9

## 2019-07-12 LAB — BASIC METABOLIC PANEL
Anion gap: 5 (ref 5–15)
BUN: 28 mg/dL — ABNORMAL HIGH (ref 8–23)
CO2: 21 mmol/L — ABNORMAL LOW (ref 22–32)
Calcium: 9.6 mg/dL (ref 8.9–10.3)
Chloride: 102 mmol/L (ref 98–111)
Creatinine, Ser: 1.64 mg/dL — ABNORMAL HIGH (ref 0.61–1.24)
GFR calc Af Amer: 47 mL/min — ABNORMAL LOW (ref >60)
GFR calc non Af Amer: 41 mL/min — ABNORMAL LOW (ref >60)
Glucose, Bld: 94 mg/dL (ref 70–99)
Potassium: 5.6 mmol/L — ABNORMAL HIGH (ref 3.5–5.1)
Sodium: 128 mmol/L — ABNORMAL LOW (ref 135–145)

## 2019-07-12 LAB — URINALYSIS, ROUTINE W REFLEX MICROSCOPIC
Bacteria, UA: NONE SEEN
Bilirubin Urine: NEGATIVE
Glucose, UA: NEGATIVE mg/dL
Hgb urine dipstick: NEGATIVE
Ketones, ur: NEGATIVE mg/dL
Nitrite: NEGATIVE
Protein, ur: NEGATIVE mg/dL
Specific Gravity, Urine: 1.013 (ref 1.005–1.030)
pH: 5 (ref 5.0–8.0)

## 2019-07-12 LAB — CBC WITH DIFFERENTIAL/PLATELET
Abs Immature Granulocytes: 0.03 10*3/uL (ref 0.00–0.07)
Basophils Absolute: 0 10*3/uL (ref 0.0–0.1)
Basophils Relative: 0 %
Eosinophils Absolute: 0.1 10*3/uL (ref 0.0–0.5)
Eosinophils Relative: 1 %
HCT: 43.6 % (ref 39.0–52.0)
Hemoglobin: 13.7 g/dL (ref 13.0–17.0)
Immature Granulocytes: 0 %
Lymphocytes Relative: 14 %
Lymphs Abs: 1.2 10*3/uL (ref 0.7–4.0)
MCH: 30.8 pg (ref 26.0–34.0)
MCHC: 31.4 g/dL (ref 30.0–36.0)
MCV: 98 fL (ref 80.0–100.0)
Monocytes Absolute: 0.7 10*3/uL (ref 0.1–1.0)
Monocytes Relative: 9 %
Neutro Abs: 6.3 10*3/uL (ref 1.7–7.7)
Neutrophils Relative %: 76 %
Platelets: 242 10*3/uL (ref 150–400)
RBC: 4.45 MIL/uL (ref 4.22–5.81)
RDW: 14.4 % (ref 11.5–15.5)
WBC: 8.4 10*3/uL (ref 4.0–10.5)
nRBC: 0 % (ref 0.0–0.2)

## 2019-07-12 LAB — COMPREHENSIVE METABOLIC PANEL
ALT: 32 U/L (ref 0–44)
AST: 52 U/L — ABNORMAL HIGH (ref 15–41)
Albumin: 3.8 g/dL (ref 3.5–5.0)
Alkaline Phosphatase: 121 U/L (ref 38–126)
Anion gap: 9 (ref 5–15)
BUN: 27 mg/dL — ABNORMAL HIGH (ref 8–23)
CO2: 20 mmol/L — ABNORMAL LOW (ref 22–32)
Calcium: 10.1 mg/dL (ref 8.9–10.3)
Chloride: 101 mmol/L (ref 98–111)
Creatinine, Ser: 1.58 mg/dL — ABNORMAL HIGH (ref 0.61–1.24)
GFR calc Af Amer: 50 mL/min — ABNORMAL LOW (ref >60)
GFR calc non Af Amer: 43 mL/min — ABNORMAL LOW (ref >60)
Glucose, Bld: 101 mg/dL — ABNORMAL HIGH (ref 70–99)
Potassium: 5.5 mmol/L — ABNORMAL HIGH (ref 3.5–5.1)
Sodium: 130 mmol/L — ABNORMAL LOW (ref 135–145)
Total Bilirubin: 0.7 mg/dL (ref 0.3–1.2)
Total Protein: 7.2 g/dL (ref 6.5–8.1)

## 2019-07-12 LAB — TROPONIN I (HIGH SENSITIVITY): Troponin I (High Sensitivity): 3 ng/L (ref <18)

## 2019-07-12 LAB — BRAIN NATRIURETIC PEPTIDE: B Natriuretic Peptide: 157 pg/mL — ABNORMAL HIGH (ref 0.0–100.0)

## 2019-07-12 LAB — MAGNESIUM: Magnesium: 2.1 mg/dL (ref 1.7–2.4)

## 2019-07-12 LAB — LACTIC ACID, PLASMA: Lactic Acid, Venous: 1.2 mmol/L (ref 0.5–1.9)

## 2019-07-12 MED ORDER — FUROSEMIDE 10 MG/ML IJ SOLN: 40.0000 mg | INTRAMUSCULAR | Status: DC

## 2019-07-12 MED ORDER — SODIUM CHLORIDE 0.9 % IV SOLN: INTRAVENOUS | Status: AC

## 2019-07-12 MED ORDER — DOXYCYCLINE HYCLATE 100 MG PO CAPS: 100.0000 mg | ORAL_CAPSULE | Freq: Two times a day (BID) | ORAL | 0 refills | Status: DC

## 2019-07-12 MED ORDER — DOXYCYCLINE HYCLATE 100 MG PO TABS
100.0000 mg | ORAL_TABLET | Freq: Once | ORAL | Status: AC
  Administered 2019-07-12: 100 mg via ORAL
  Filled 2019-07-12: qty 1

## 2019-07-12 MED ORDER — ASPIRIN 81 MG PO CHEW
324.0000 mg | CHEWABLE_TABLET | Freq: Once | ORAL | Status: AC
  Administered 2019-07-12: 16:00:00 324 mg via ORAL
  Filled 2019-07-12: qty 4

## 2019-07-12 NOTE — ED Provider Notes (Signed)
[facility] EMERGENCY DEPARTMENT Provider Note   CSN: [id number] Arrival date & time: 07/12/19  1431     History   Chief Complaint Chief Complaint  Patient presents with  . Shortness of Breath    HPI [name] is a 73 y.o. male.     HPI  This patient is a 73 year old male, he currently is being treated for combination of illnesses including atrial fibrillation, congestive heart failure, he has a history of giant cell arteritis, history of possible cirrhosis of the liver, sleep apnea on CPAP and he is morbidly obese.  According to the medical record the patient was last discharged from the hospital on April 7, after being admitted on January 31.  He spent the better part of 2-1/2 months in the hospital secondary to what appeared to be an infection of the hip.  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  The patient presents today after being at home for the last 7 months in bed, nonambulatory with a physical therapy that comes into the house and home health services 4 hours a day.  He states that he has been short of breath which is increasing in the last 24 hours with green phlegm when he coughs.  He does not have any chest pain, denies any swelling of his legs but has had some shortness of breath overnight.  He denies having any fevers or chills, he has had diarrhea for the last 4 days which he reports is watery and which has been treated with Imodium which is slightly improving and now he has just 2 bowel movements per day.  No abdominal pain, no vomiting, normal appetite.  He denies having any sick exposures as he lives by himself and has a home health aide for only 4 hours/day.  Past Medical History:  Diagnosis Date  . Anginal pain ([place]) 2006   evaluated by cardio   . Arthritis    knees,feet,shoulders,elbows.hands  . Atrial fibrillation ([place])   . CHF (congestive heart failure) ([place])   . Constipation   . Dyspnea    with exertion   . Dysrhythmia    Atrial Flutter- 2006- corredted itself  . Family history of adverse reaction to anesthesia    mother- with novocaine went into shock  . GERD (gastroesophageal reflux disease)   . Headache   . Hemorrhoids   . History of blood transfusion   . Hypotension   . Insomnia   . Sleep apnea    cpap  . 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 Goals of care, counseling/discussion   . Palliative care encounter   . Hyperkalemia 10/01/2018  . Other cirrhosis of liver ([place]) 10/01/2018  . Chronic back pain 10/01/2018  . Cellulitis of left hip   . 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 Physical debility 09/04/2018  . Pleural effusion, bilateral   . Hypertensive urgency 08/17/2018  . Pleural effusion on left 08/17/2018  . Chronic diastolic CHF (congestive heart failure) ([place]) 08/17/2018  . OSA on CPAP 08/17/2018  . Unspecified atrial fibrillation ([place])   . Delirium   . Depression   . OSA (obstructive sleep apnea)   . Obesity, Class III, BMI 40-49.9 (morbid obesity) ([place])   . Atrial fibrillation with  RVR ([place]) 04/16/2018  . Morbid obesity with BMI of 50.0-59.9, adult ([place]) 04/16/2018  . AKI (acute kidney injury) ([place]) 04/16/2018  . Visual hallucination 04/16/2018  . SIRS (systemic inflammatory response syndrome) ([place]) 04/16/2018  . Anemia of chronic disease 04/16/2018  . Left hip postoperative wound infection 03/15/2018  . 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 Femur fracture, left ([place]) 02/15/2018  . Anemia 02/15/2018  . Femur fracture ([place]) 02/15/2018  . 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 SOB (shortness of breath) 03/31/2014  . Hypertension   . Hyponatremia 03/18/2012  . 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 APPENDECTOMY    . ARTERY BIOPSY Left 12/30/2017   Procedure: BIOPSY TEMPORAL ARTERY;  Surgeon: Fanny Skates, MD;  Location: [facility];  Service: General;  Laterality: Left;  . CHOLECYSTECTOMY    . COLONOSCOPY W/ POLYPECTOMY    . ESOPHAGOGASTRODUODENOSCOPY (EGD) WITH PROPOFOL  07/06/2012   Procedure: ESOPHAGOGASTRODUODENOSCOPY (EGD) WITH PROPOFOL;  Surgeon: Jeryl Columbia, MD;  Location: [facility];  Service: Endoscopy;  Laterality: N/A;  . ESOPHAGOSCOPY    . EYE SURGERY     left eye- muscle repair  . HERNIA REPAIR     ventral hernia  . INCISION AND DRAINAGE HIP Left 03/17/2018   Procedure: IRRIGATION AND DEBRIDEMENT LEFT THIGH WOUND;  Surgeon: Gaynelle Arabian, MD;  Location: [facility];  Service: Orthopedics;  Laterality: Left;  . JOINT REPLACEMENT     bilateral hips.  Right broke and had to be re placed again  . MASS EXCISION N/A 03/02/2015   Procedure: EXCISION ABDOMINAL WALL MASS;  Surgeon: Alphonsa Overall, MD;  Location: [facility];  Service: General;  Laterality: N/A;  . TOTAL HIP REVISION Left 02/17/2018   Procedure: Left total hip arthroplasty revision;  Surgeon: Gaynelle Arabian, MD;  Location: [facility];  Service: Orthopedics;  Laterality: Left;  Marland Kitchen VERTICAL BANDED GASTROPLASTY          Home Medications    Prior to Admission medications   Medication Sig Start Date End Date Taking? Authorizing Provider  acetaminophen (TYLENOL) 325 MG tablet Take 2 tablets (650 mg total) by mouth every 6 (six) hours as needed for mild pain, fever or headache. Patient not taking: Reported on 10/01/2018 04/27/18   Barton Dubois, MD  albuterol (PROVENTIL HFA;VENTOLIN HFA) 108 [phone] Base) MCG/ACT inhaler Inhale 1-2 puffs into the lungs every 6 (six) hours as needed for wheezing or shortness of breath. 09/02/18   Hongalgi, Lenis Dickinson, MD  apixaban (ELIQUIS) 5 MG TABS tablet Take 1 tablet (5 mg total) by mouth 2 (two) times  daily. 09/02/18   Hongalgi, Lenis Dickinson, MD  calcium carbonate (TUMS - DOSED IN MG ELEMENTAL CALCIUM) 500 MG chewable tablet Chew 1,000 mg by mouth 3 (three) times daily as needed for indigestion.    [provider]  carboxymethylcellulose (REFRESH TEARS) 0.5 % SOLN Place 1 drop into both eyes daily as needed (dry eye/irritation). 09/02/18   Hongalgi, Lenis Dickinson, MD  Cholecalciferol (VITAMIN D3) 2000 units TABS Take 2,000 Units by mouth every evening.     [provider]  cycloSPORINE (RESTASIS) 0.05 % ophthalmic emulsion Place 1 drop into both eyes 2 (two) times daily. 09/02/18   Hongalgi, Lenis Dickinson, MD  diclofenac sodium (VOLTAREN) 1 % GEL Apply 2 g topically 2 (two) times daily. 11/26/18   Barb Merino, MD  diltiazem (CARDIZEM CD) 120 MG 24 hr capsule  Take 1 capsule (120 mg total) by mouth daily. 12/07/18   Thurnell Lose, MD  Ensure Max Protein (ENSURE MAX PROTEIN) LIQD Take 330 mLs (11 oz total) by mouth 2 (two) times daily. 12/07/18   Thurnell Lose, MD  escitalopram (LEXAPRO) 10 MG tablet Take 1 tablet (10 mg total) by mouth daily. 09/02/18   Hongalgi, Lenis Dickinson, MD  ferrous sulfate 325 (65 FE) MG tablet Take 1 tablet (325 mg total) by mouth 3 (three) times daily with meals. 09/02/18   Hongalgi, Lenis Dickinson, MD  gabapentin (NEURONTIN) 300 MG capsule Take 1 capsule (300 mg total) by mouth 3 (three) times daily. 12/07/18   Thurnell Lose, MD  hydrOXYzine (ATARAX/VISTARIL) 25 MG tablet Take 1 tablet (25 mg total) by mouth daily as needed for anxiety. 11/26/18   Barb Merino, MD  lactulose (CHRONULAC) 10 GM/15ML solution Take 45 mLs (30 g total) by mouth 2 (two) times daily. 12/07/18   Thurnell Lose, MD  lidocaine (LIDODERM) 5 % Place 1 patch onto the skin daily. Remove & Discard patch within 12 hours or as directed by MD 09/11/18   Bonnielee Haff, MD  Melatonin 3 MG TABS Take 2 tablets (6 mg total) by mouth at bedtime. 09/11/18   Bonnielee Haff, MD  midodrine (PROAMATINE) 10 MG tablet Take 1  tablet (10 mg total) by mouth 3 (three) times daily with meals. 12/07/18   Thurnell Lose, MD  Multiple Vitamins-Minerals (CENTRUM ADULTS PO) Take 1 tablet by mouth every evening.     [provider]  oxyCODONE (OXY IR/ROXICODONE) 5 MG immediate release tablet Take 1 tablet (5 mg total) by mouth every 6 (six) hours as needed for moderate pain, severe pain or breakthrough pain. 12/07/18   Thurnell Lose, MD  pantoprazole (PROTONIX) 40 MG tablet Take 1 tablet (40 mg total) by mouth daily. 09/02/18   Hongalgi, Lenis Dickinson, MD  polyethylene glycol (MIRALAX / GLYCOLAX) packet Take 17 g by mouth daily as needed for mild constipation. 09/02/18   Hongalgi, Lenis Dickinson, MD  rOPINIRole (REQUIP) 0.25 MG tablet Take 1 tablet (0.25 mg total) by mouth at bedtime. 09/02/18   Hongalgi, Lenis Dickinson, MD  senna-docusate (SENOKOT-S) 8.6-50 MG tablet Take 2 tablets by mouth 2 (two) times daily. Patient not taking: Reported on 10/01/2018 09/02/18   Modena Jansky, MD  spironolactone (ALDACTONE) 50 MG tablet Take 100mg  every morning and 50mg  every evening 09/11/18   Bonnielee Haff, MD  thiamine 100 MG tablet Take 100 mg by mouth daily. 07/15/18   [provider]  traZODone (DESYREL) 50 MG tablet Take 1 tablet (50 mg total) by mouth at bedtime as needed for sleep. 11/26/18   Barb Merino, MD  valACYclovir (VALTREX) 1000 MG tablet Take 1 tablet (1,000 mg total) by mouth every 8 (eight) hours. 12/07/18   Thurnell Lose, MD    Family History Family History  Problem Relation Age of Onset  . Cancer Mother   . Alcohol abuse Father     Social History Social History   Tobacco Use  . Smoking status: Former Smoker    Packs/day: 2.00    Years: 10.00    Pack years: 20.00    Types: Cigarettes    Start date: 05/16/1966    Quit date: 03/11/1984    Years since quitting: 35.3  . Smokeless tobacco: Never Used  Substance Use Topics  . Alcohol use: Yes    Alcohol/week: 0.0 standard drinks    Comment: occasionally  a  couple of times a year   . Drug use: No     Allergies   Bee venom and Nickel   Review of Systems Review of Systems  All other systems reviewed and are negative.    Physical Exam Updated Vital Signs BP 102/73   Pulse 77   Temp 98.3 F (36.8 C) (Oral)   Resp 14   Ht 1.727 m (5\' 8" )   Wt 136.1 kg   SpO2 93%   BMI 45.61 kg/m   Physical Exam Vitals signs and nursing note reviewed.  Constitutional:      General: He is not in acute distress.    Appearance: He is well-developed.  HENT:     Head: Normocephalic and atraumatic.     Mouth/Throat:     Pharynx: No oropharyngeal exudate.  Eyes:     General: No scleral icterus.       Right eye: No discharge.        Left eye: No discharge.     Conjunctiva/sclera: Conjunctivae normal.     Pupils: Pupils are equal, round, and reactive to light.  Neck:     Musculoskeletal: Normal range of motion and neck supple.     Thyroid: No thyromegaly.     Vascular: No JVD.  Cardiovascular:     Rate and Rhythm: Normal rate and regular rhythm.     Heart sounds: Normal heart sounds. No murmur. No friction rub. No gallop.   Pulmonary:     Effort: Pulmonary effort is normal. No respiratory distress.     Breath sounds: Normal breath sounds. No wheezing or rales.  Abdominal:     General: Bowel sounds are normal. There is no distension.     Palpations: Abdomen is soft. There is no mass.     Tenderness: There is no abdominal tenderness.  Musculoskeletal: Normal range of motion.        General: No tenderness.  Lymphadenopathy:     Cervical: No cervical adenopathy.  Skin:    General: Skin is warm and dry.     Findings: No erythema or rash.  Neurological:     Mental Status: He is alert.     Coordination: Coordination normal.  Psychiatric:        Behavior: Behavior normal.      ED Treatments / Results  Labs (all labs ordered are listed, but only abnormal results are displayed) Labs Reviewed  COMPREHENSIVE METABOLIC PANEL - Abnormal;  Notable for the following components:      Result Value   Sodium 130 (*)    Potassium 5.5 (*)    CO2 20 (*)    Glucose, Bld 101 (*)    BUN 27 (*)    Creatinine, Ser 1.58 (*)    AST 52 (*)    GFR calc non Af Amer 43 (*)    GFR calc Af Amer 50 (*)    All other components within normal limits  BRAIN NATRIURETIC PEPTIDE - Abnormal; Notable for the following components:   B Natriuretic Peptide 157.0 (*)    All other components within normal limits  BASIC METABOLIC PANEL - Abnormal; Notable for the following components:   Sodium 128 (*)    Potassium 5.6 (*)    CO2 21 (*)    BUN 28 (*)    Creatinine, Ser 1.64 (*)    GFR calc non Af Amer 41 (*)    GFR calc Af Amer 47 (*)    All other components within normal  limits  URINALYSIS, ROUTINE W REFLEX MICROSCOPIC - Abnormal; Notable for the following components:   APPearance HAZY (*)    Leukocytes,Ua SMALL (*)    All other components within normal limits  CULTURE, BLOOD (ROUTINE X 2)  CULTURE, BLOOD (ROUTINE X 2)  SARS CORONAVIRUS 2 (TAT 6-24 HRS)  URINE CULTURE  CBC WITH DIFFERENTIAL/PLATELET  LACTIC ACID, PLASMA  MAGNESIUM  TROPONIN I (HIGH SENSITIVITY)    EKG EKG Interpretation  Date/Time:  Tuesday July 12 2019 14:53:32 EST Ventricular Rate:  77 PR Interval:    QRS Duration: 91 QT Interval:  373 QTC Calculation: 423 R Axis:   114 Text Interpretation: Atrial fibrillation Right axis deviation Low voltage, extremity and precordial leads since last tracing no significant change Confirmed by Noemi Chapel [phone]) on 07/12/2019 3:18:03 PM   Radiology Dg Chest Port 1 View  Result Date: 07/12/2019 CLINICAL DATA:  Shortness of breath.  Cough. EXAM: PORTABLE CHEST 1 VIEW COMPARISON:  December 04, 2018 FINDINGS: The heart size is stable from prior study. There are small bilateral pleural effusions, similar to prior study. There is scarring versus atelectasis in the left mid lung zone. Biapical scarring is noted. Aortic calcifications  are noted. There are end-stage degenerative changes of the left glenohumeral joint. The left-sided PICC line has been removed. Calcified pleural based plaques are noted on the left. IMPRESSION: No significant interval change. Persistent small bilateral pleural effusions with scarring and atelectasis throughout the left lung field with associated chronic pleural thickening. Electronically Signed   By: Constance Holster M.D.   On: 07/12/2019 16:29    Procedures Procedures (including critical care time)  Medications Ordered in ED Medications  0.9 %  sodium chloride infusion (has no administration in time range)  aspirin chewable tablet 324 mg (324 mg Oral Given 07/12/19 1622)  doxycycline (VIBRA-TABS) tablet 100 mg (100 mg Oral Given 07/12/19 1836)     Initial Impression / Assessment and Plan / ED Course  I have reviewed the triage vital signs and the nursing notes.  Pertinent labs & imaging results that were available during my care of the patient were reviewed by me and considered in my medical decision making (see chart for details).        The patient has no significant findings on his exam in fact there is no rales, no wheezing, normal work of breathing, oxygen of 96 to 97% on room air, no edema of the legs and no JVD.  He is morbidly obese, he has significant problems with deconditioning and failure to thrive.  The diarrhea for 4 days raises concern for possible dehydration, with his history of pleural effusions, cryptogenic cirrhosis, possible congestive heart failure there are multiple possible etiologies for shortness of breath.  I would even concern for pulmonary embolism except that he takes Eliquis, has no tachycardia and no hypoxia.  We will proceed with chest x-ray and lab work, the patient is agreeable, the EKG is unremarkable and unchanged. The patient shows rate controlled A. fib, low voltage  The x-ray shows no signs of infiltrate, the labs show no leukocytosis, lactic acid  is normal, BNP is better than it has been in the past and the metabolic panel does show slight hyponatremia and hyperkalemia, this will need to be repeated.  He will be given some IV fluids for likely dehydration given a slight bump in creatinine, we will order doxycycline to cover for some type of atypical bronchitis type pneumonia given his green sputum and increasing shortness of  breath.  That being said his vital signs appear relatively stable, he has not tachypneic tachycardic febrile or hypotensive.  Diarrhea likely led to some dehydration IVF given - needs recheck of k and Cr - pt wants to do this as outpatient Will given home health services - face to face consult Pt agreeable Pulse 100 on my exam, UA clean.  [name] was evaluated in Emergency Department on 07/12/2019 for the symptoms described in the history of present illness. He was evaluated in the context of the global COVID-19 pandemic, which necessitated consideration that the patient might be at risk for infection with the SARS-CoV-2 virus that causes COVID-19. Institutional protocols and algorithms that pertain to the evaluation of patients at risk for COVID-19 are in a state of rapid change based on information released by regulatory bodies including the CDC and federal and state organizations. These policies and algorithms were followed during the patient's care in the ED.   Final Clinical Impressions(s) / ED Diagnoses   Final diagnoses:  Renal insufficiency  Cough  Hyperkalemia  Diarrhea, unspecified type    ED Discharge Orders         Ordered    doxycycline (VIBRAMYCIN) 100 MG capsule  2 times daily     07/12/19 2146    Home Health     07/12/19 2149    Face-to-face encounter (required for Medicare/Medicaid patients)    Comments: I Johnna Acosta certify that this patient is under my care and that I, or a nurse practitioner or physician's assistant working with me, had a face-to-face encounter that meets the  physician face-to-face encounter requirements with this patient on 07/12/2019. The encounter with the patient was in whole, or in part for the following medical condition(s) which is the primary reason for home health care (List medical condition): bed bound- has no access to transportation, needs home health, PT / OT and transportation - SW   07/12/19 2149           Noemi Chapel, MD 07/12/19 2151

## 2019-07-12 NOTE — Discharge Instructions (Signed)
Your testing today shows that your kidney function is slightly off, this is likely from dehydration and we have given you IV fluids.  Please continue to take lots of oral fluids over the next several days.  Your x-ray does not show pneumonia but with a cough productive of green sputum it is likely that you have some type of infectious process so please take doxycycline twice a day for the next 10 days  You may continue Imodium as needed for diarrhea  You will need to have your kidney function and your potassium level rechecked within 48 hours.  I have asked for a home health service to contact you regarding increasing amounts of home health, social work, physical therapy and a home health aide to extend hours.  If you should develop severe or worsening symptoms please return to the emergency department, call 911 as needed.

## 2019-07-12 NOTE — ED Triage Notes (Signed)
Pt brought in by [facility] with c/o SOB, productive cough that started today. Pt is bed bound at home. Pt has also been having nausea and diarrhea x 4 days. Pt reports fever at home but reports the highest it got to was 99.

## 2019-07-12 NOTE — ED Notes (Signed)
Pillow placed under pt's right side for comfort; pt states he has bed sores and needs to be turned often

## 2019-07-13 LAB — SARS CORONAVIRUS 2 (TAT 6-24 HRS): SARS Coronavirus 2: NEGATIVE

## 2019-07-14 LAB — URINE CULTURE: Culture: NO GROWTH

## 2019-07-17 LAB — CULTURE, BLOOD (ROUTINE X 2)
Culture: NO GROWTH
Culture: NO GROWTH
Special Requests: ADEQUATE
Special Requests: ADEQUATE

## 2019-07-19 IMAGING — DX DG HIP (WITH OR WITHOUT PELVIS) 2V BILAT
5 series · 5 of 5 positions shown · non-contrast
Comparison: None.

CLINICAL DATA: Fall.  Pain.

EXAM:
DG HIP (WITH OR WITHOUT PELVIS) 2V BILAT

[t hip ap left (1 of 2)]
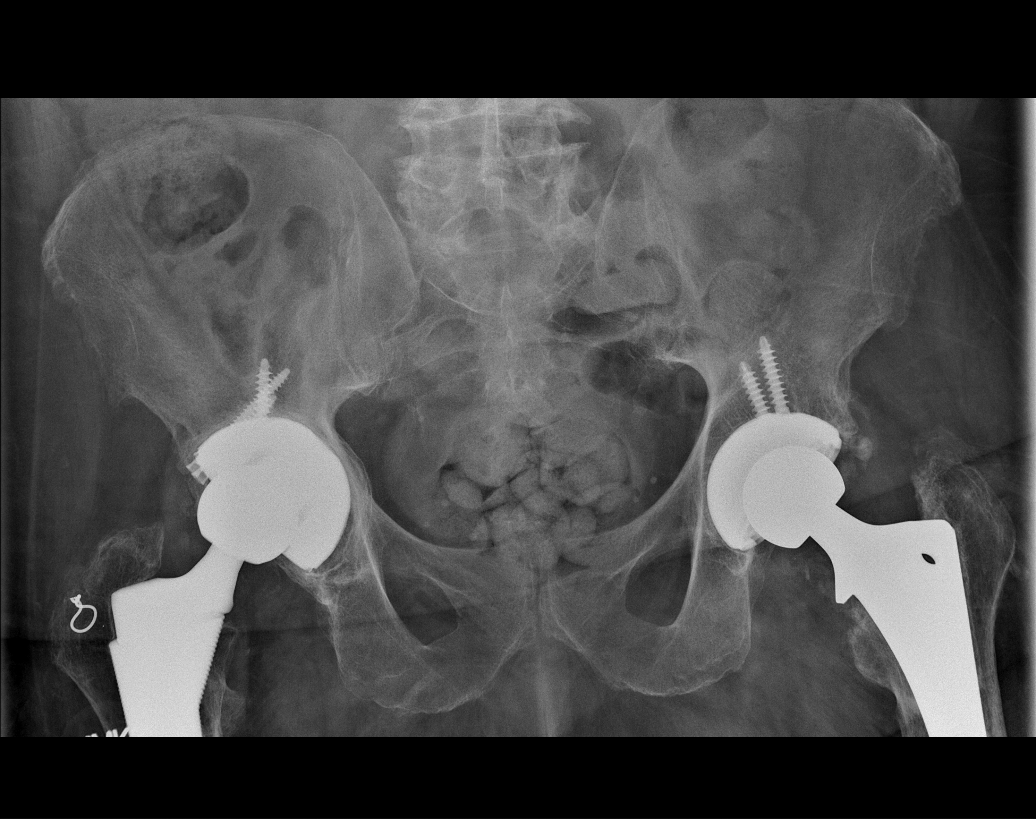

[t hip ap left (2 of 2)]
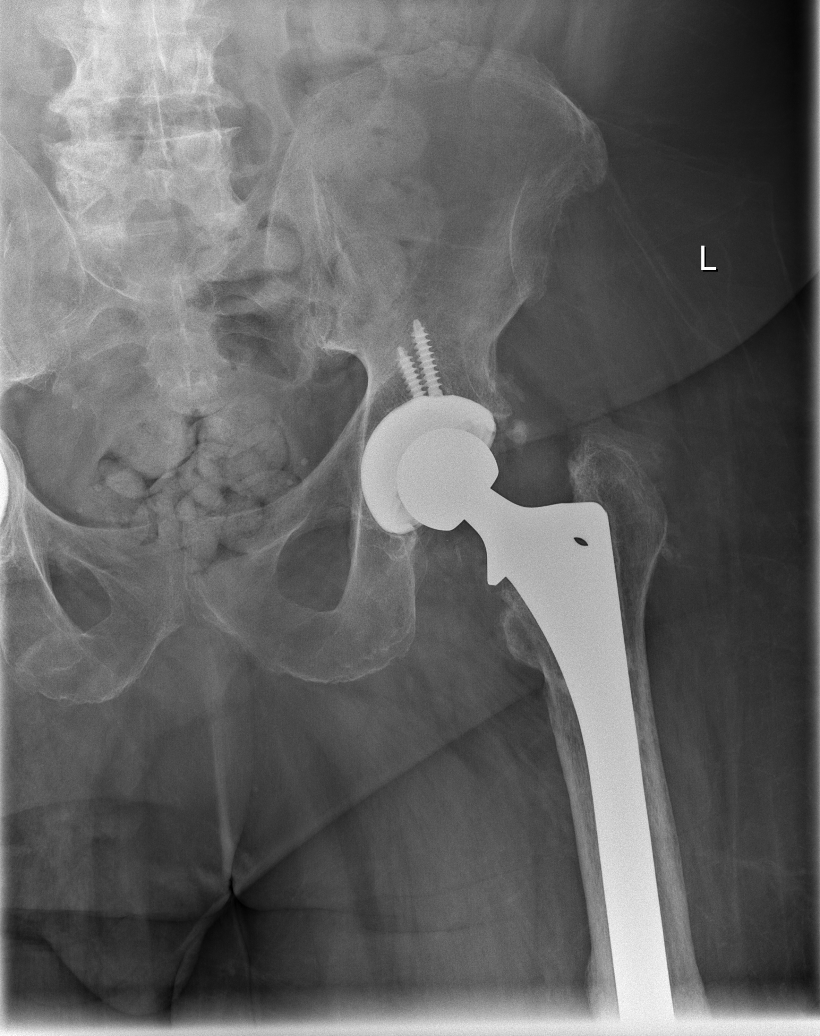

[t hip ap right]
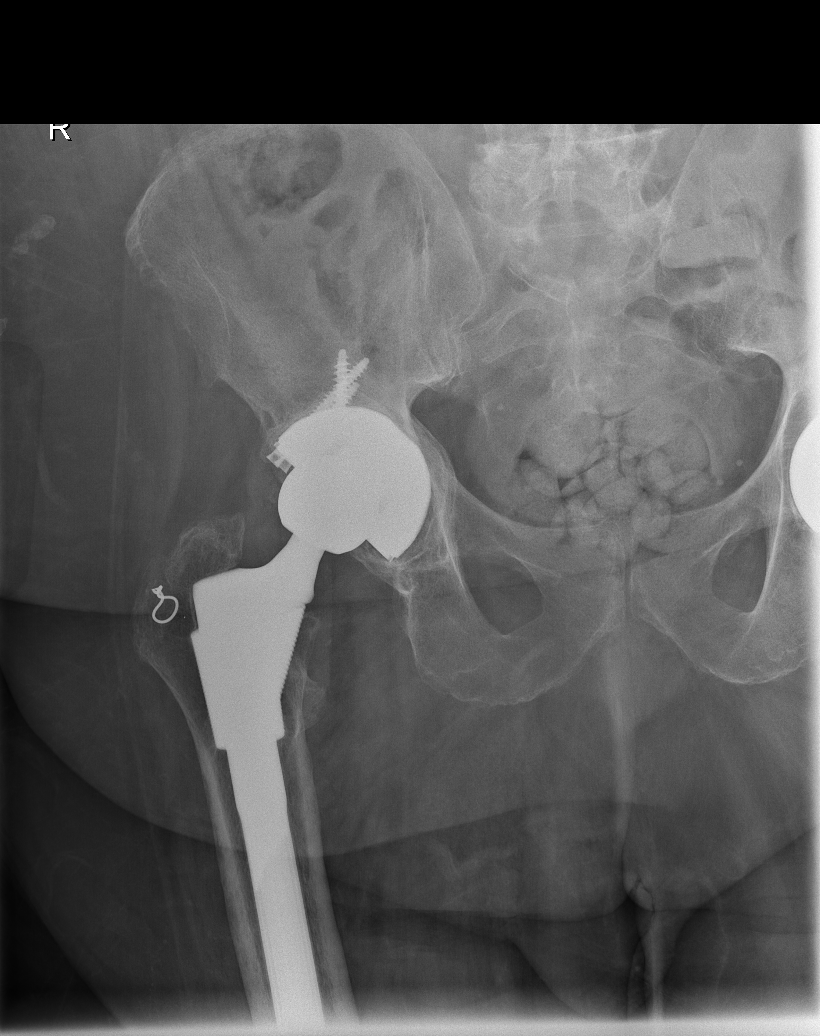

[x hip lat left]
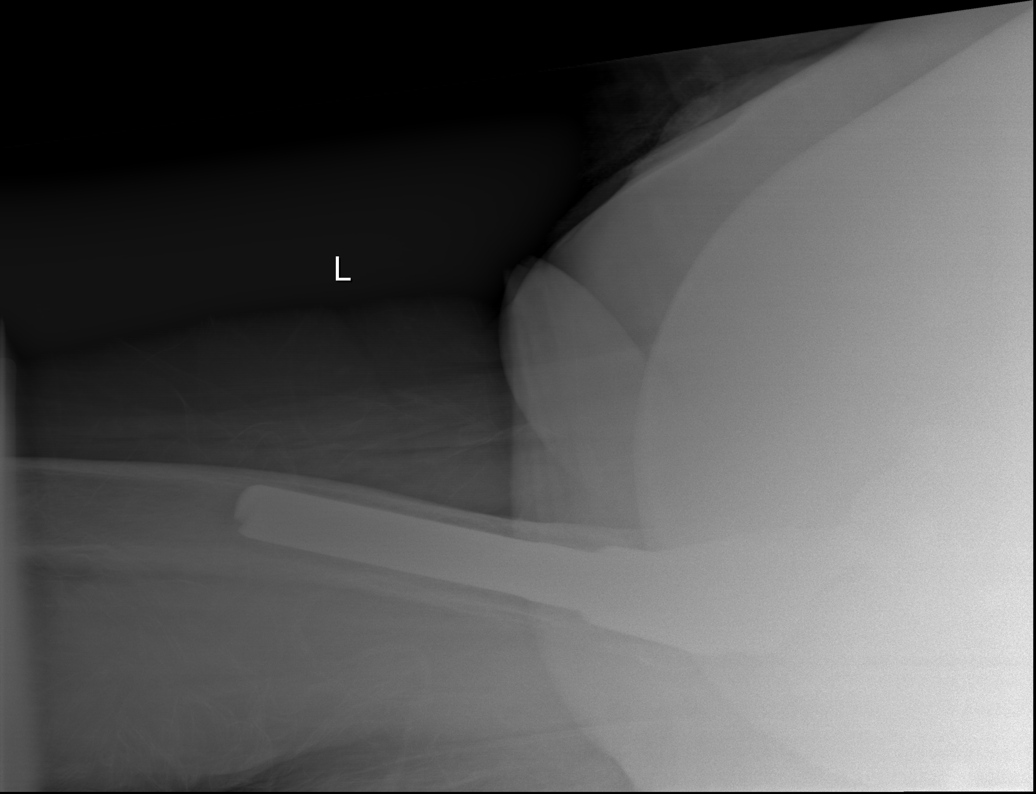

[x hip lat right]
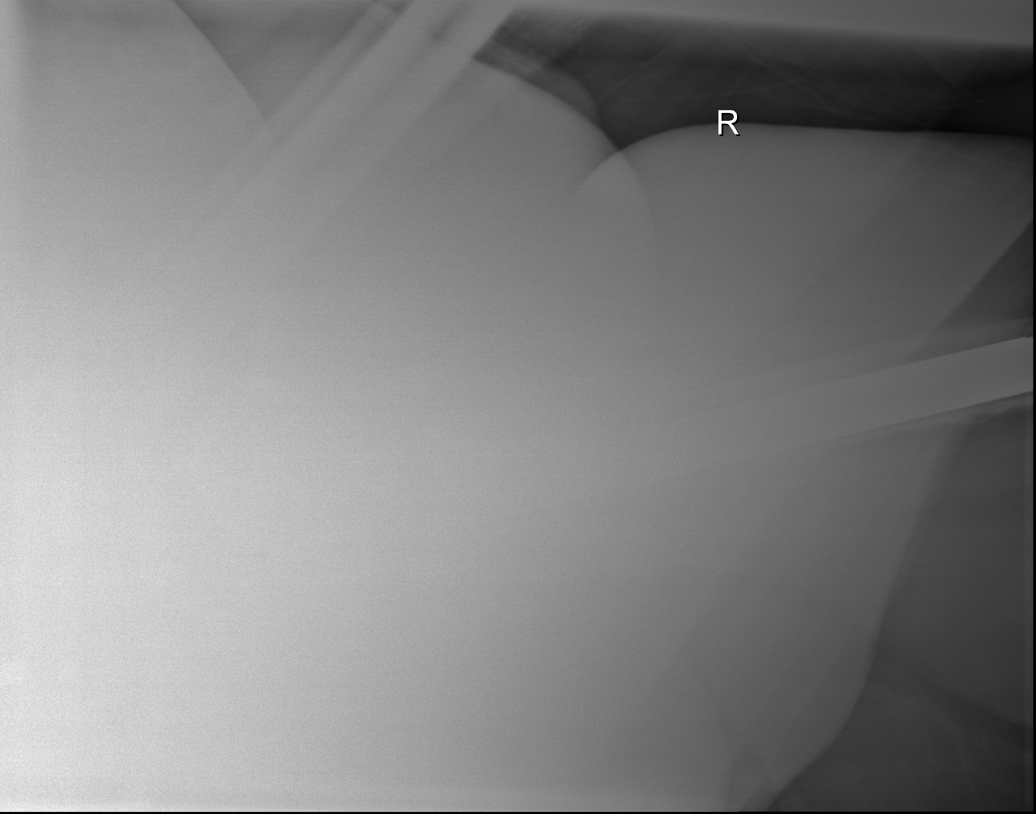

[5 of 5 positions shown; findings below may reference images not displayed]

FINDINGS: The patient is status post bilateral hip replacements. Visualized
hardware is in good position. No acute fractures.
IMPRESSION: Bilateral hip replacements.  No acute fractures.

## 2019-07-20 IMAGING — DX DG FOOT 2V*L*
2 series · 2 of 2 positions shown · non-contrast
Comparison: None.

CLINICAL DATA: Pain in left foot.

EXAM:
LEFT FOOT - 2 VIEW

[foot ap]
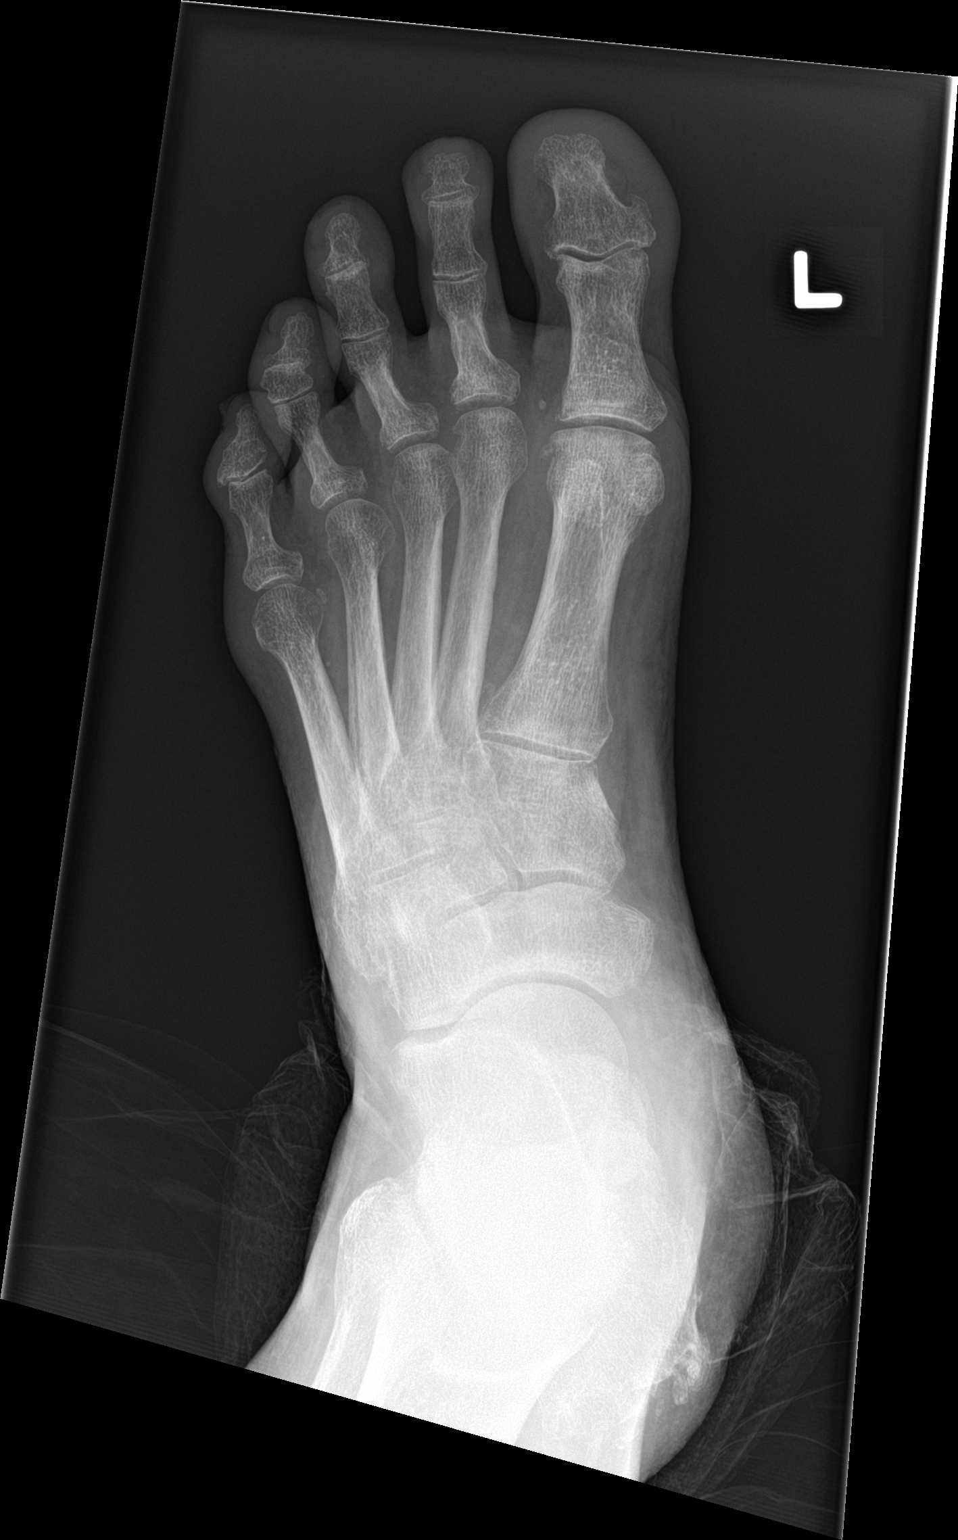

[foot lat]
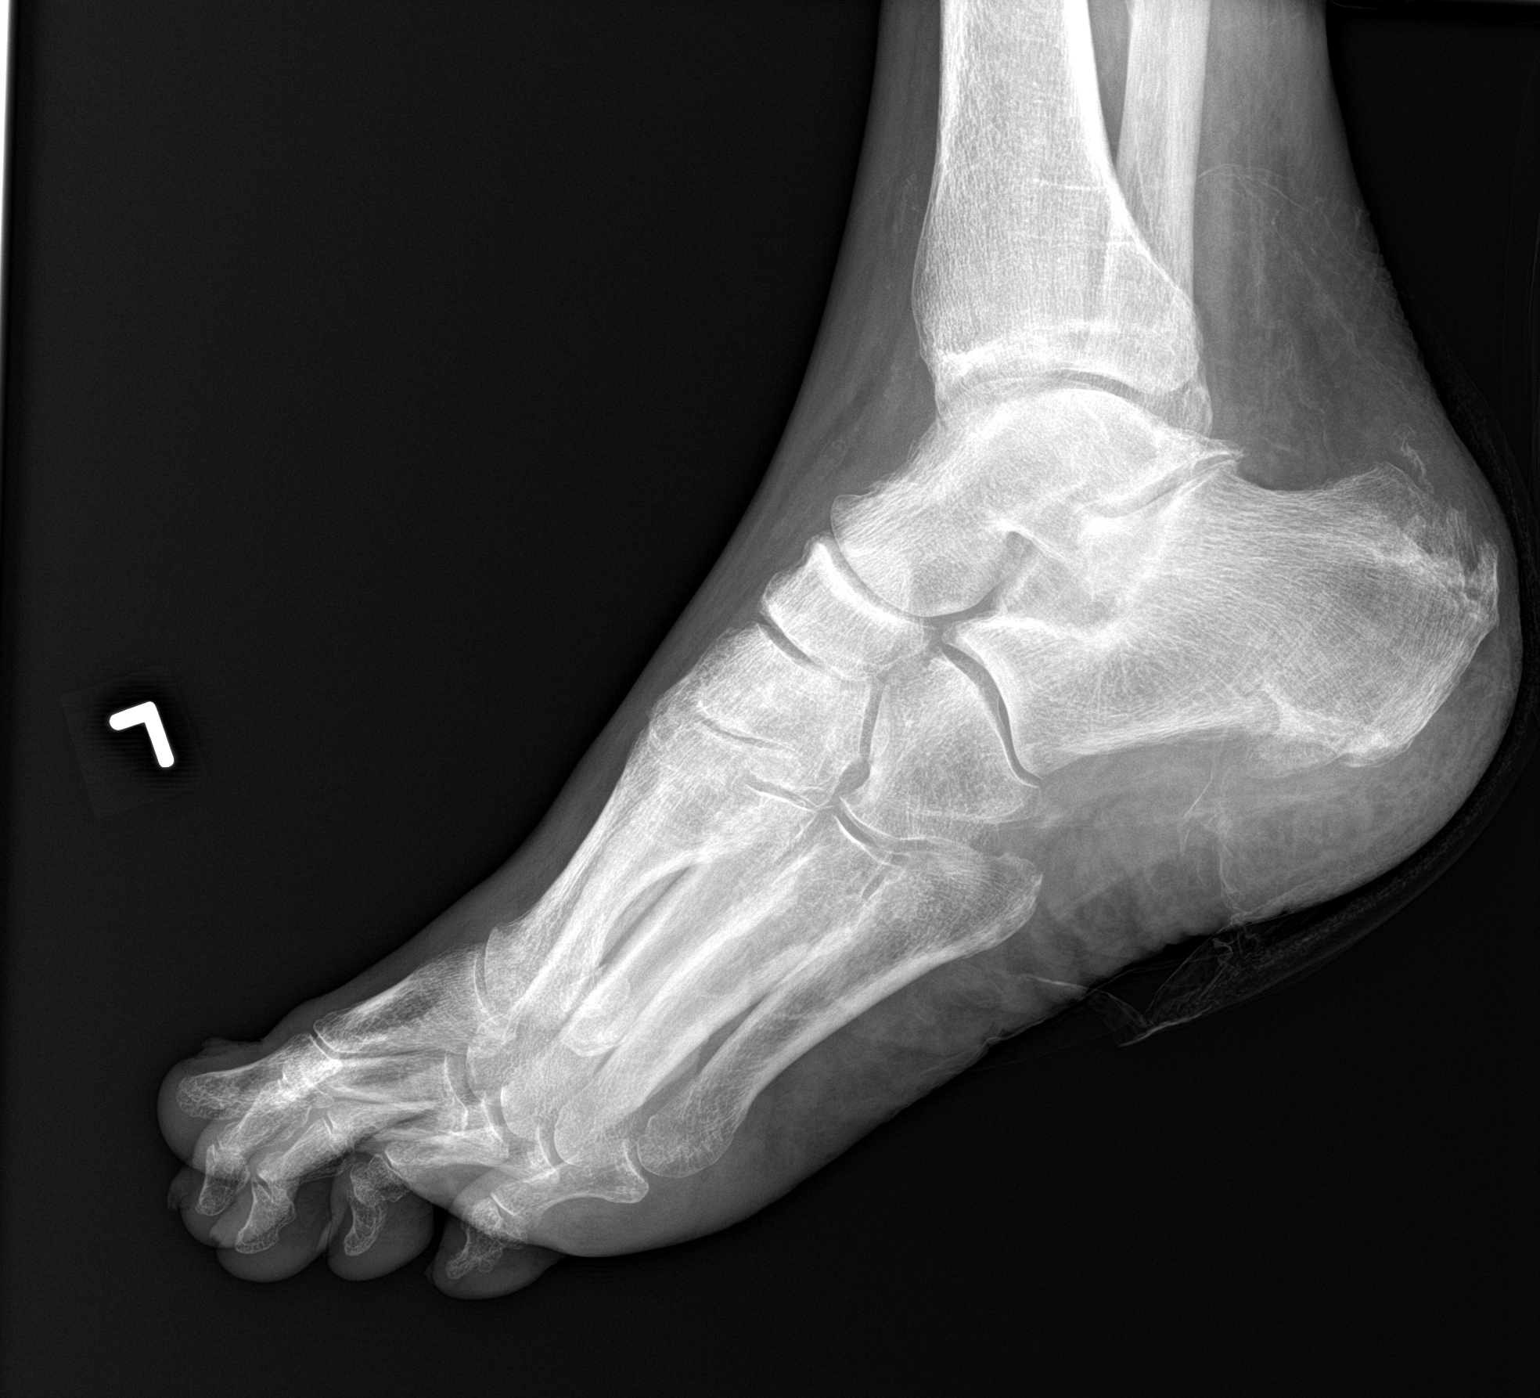

[2 of 2 positions shown; findings below may reference images not displayed]

FINDINGS: Osteopenia. No fractures in the toes or metacarpals. Enthesopathic
change at the posterior calcaneus. There is a lucency along the
inferior aspect of the calcaneus which does not appear to extend
through the calcaneus and is relatively well corticated. No other
bony abnormalities.
IMPRESSION: 1. There is a lucency along the inferior aspect of the calcaneus
which is relatively well corticated and does not appear to extend
through the calcaneus. This is favored to be nonacute. Recommend
clinical correlation to exclude pain in this region.

## 2019-08-04 IMAGING — DX DG ABD PORTABLE 1V
2 series · 2 of 2 positions shown · non-contrast
Comparison: CT 01/09/2015

CLINICAL DATA: Constipation

EXAM:
PORTABLE ABDOMEN - 1 VIEW

[abdomen kub (1 of 2)]
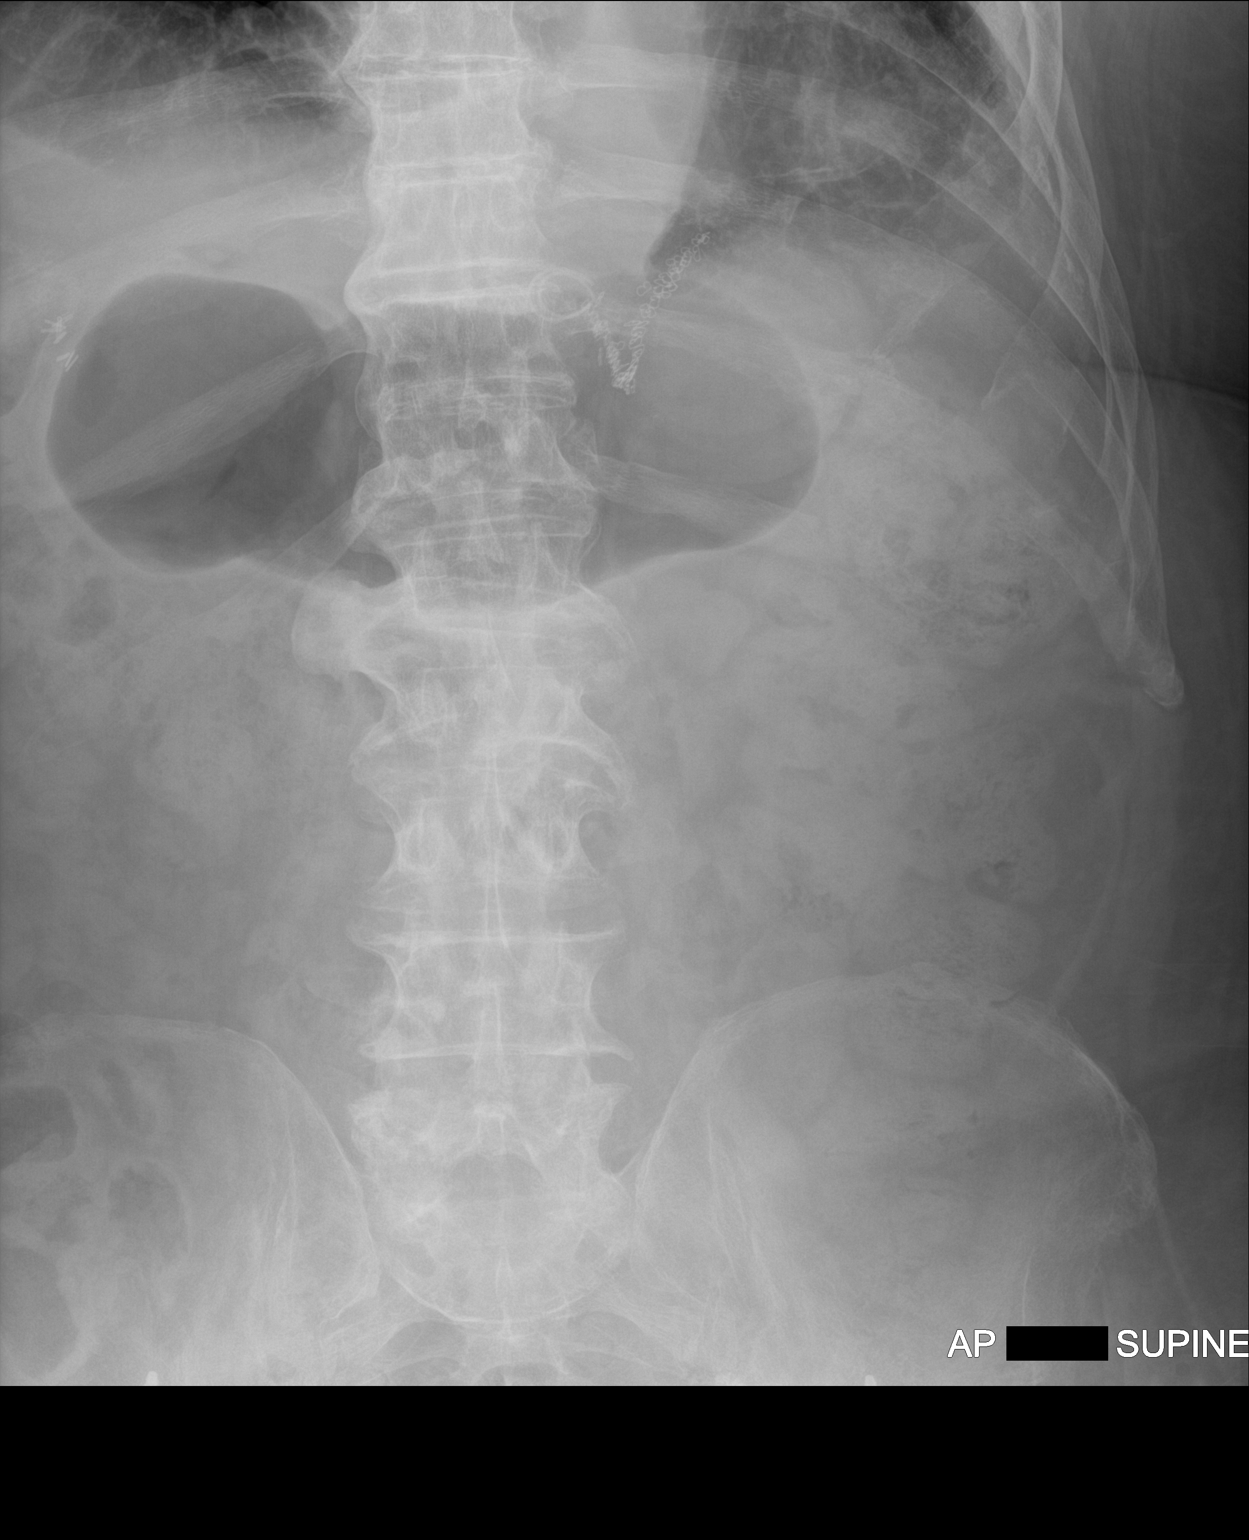

[abdomen kub (2 of 2)]
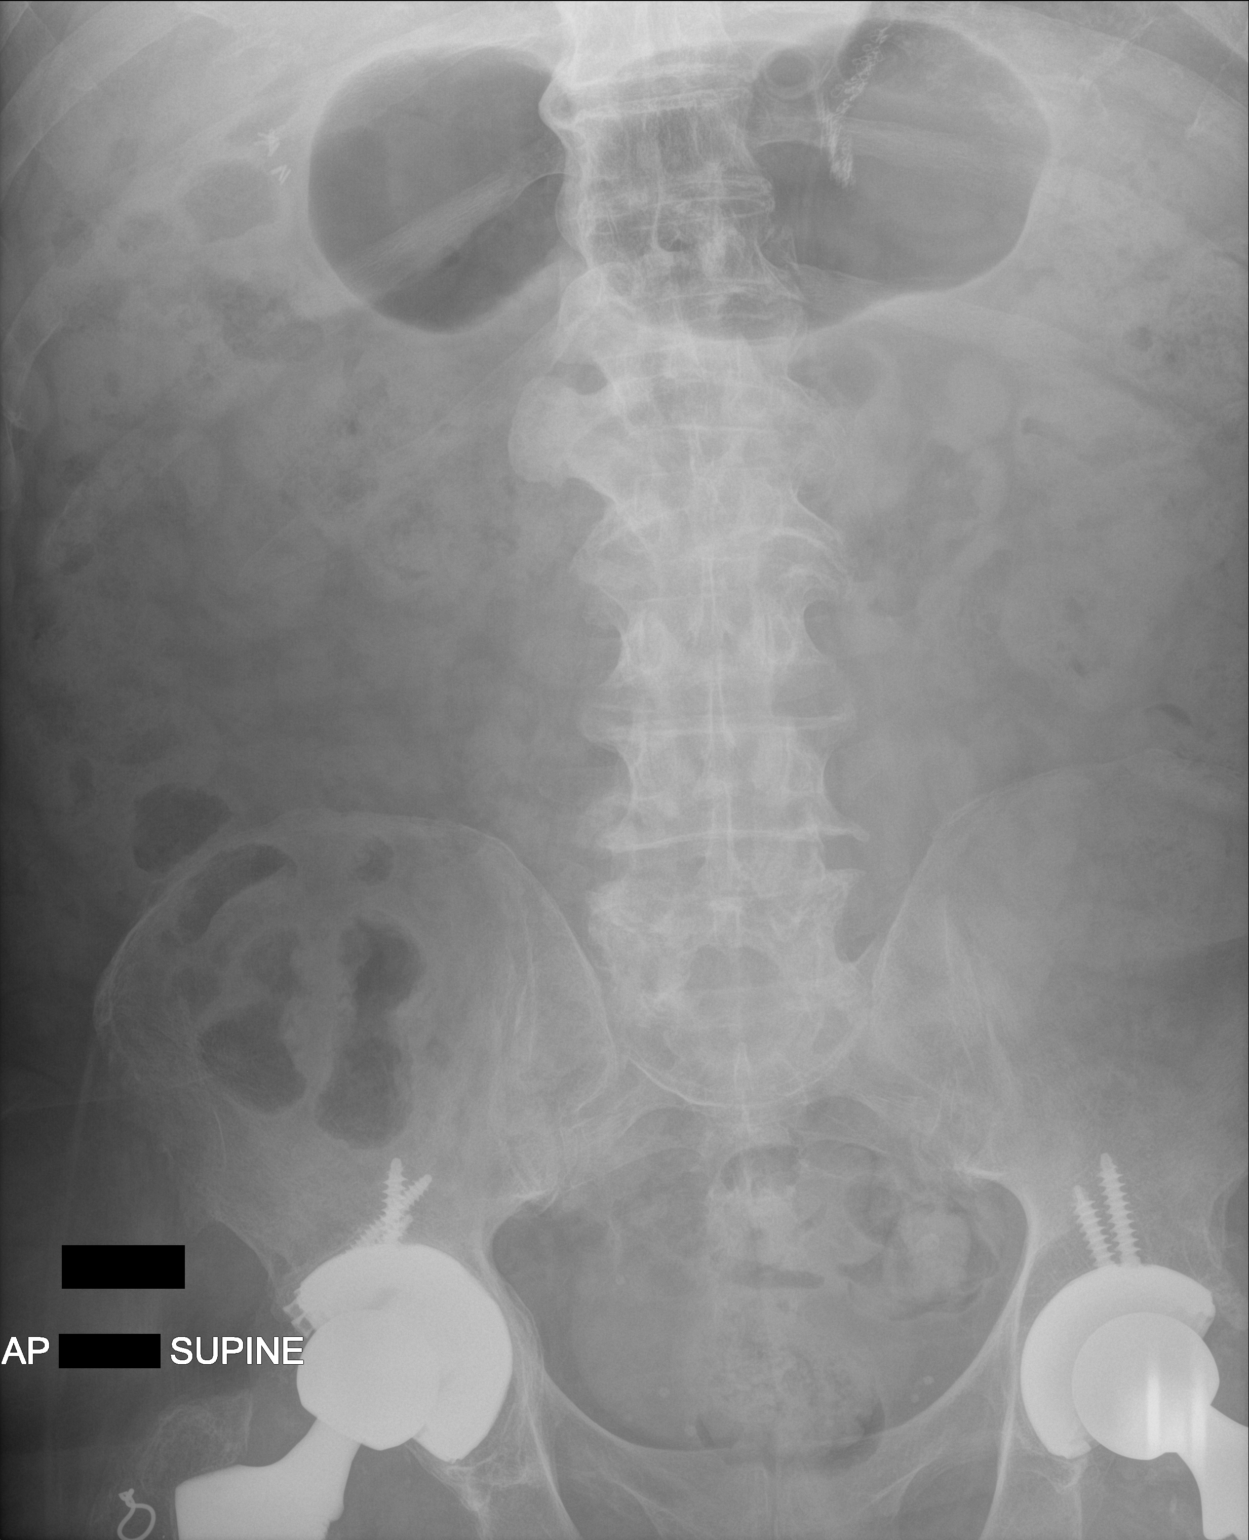

[2 of 2 positions shown; findings below may reference images not displayed]

FINDINGS: Large stool burden throughout the colon. No evidence of bowel
obstruction or free air. Prior cholecystectomy. Surgical sutures in
the left upper abdomen.
IMPRESSION: Large stool burden.  No bowel obstruction or free air.

## 2020-09-25 ENCOUNTER — Encounter (HOSPITAL_COMMUNITY): Payer: Self-pay

## 2020-09-25 ENCOUNTER — Inpatient Hospital Stay (HOSPITAL_COMMUNITY)
Admission: EM | Admit: 2020-09-25 | Discharge: 2020-09-28 | DRG: 178 | Disposition: A | Discharge status: Home Health | Payer: Medicare Other | Attending: Internal Medicine | Admitting: Internal Medicine

## 2020-09-25 ENCOUNTER — Emergency Department (HOSPITAL_COMMUNITY): Payer: Medicare Other

## 2020-09-25 ENCOUNTER — Other Ambulatory Visit: Payer: Self-pay

## 2020-09-25 DIAGNOSIS — D7589 Other specified diseases of blood and blood-forming organs: Secondary | ICD-10-CM | POA: Diagnosis present

## 2020-09-25 DIAGNOSIS — Z9989 Dependence on other enabling machines and devices: Secondary | ICD-10-CM

## 2020-09-25 DIAGNOSIS — Z7401 Bed confinement status: Secondary | ICD-10-CM

## 2020-09-25 DIAGNOSIS — R55 Syncope and collapse: Secondary | ICD-10-CM | POA: Diagnosis not present

## 2020-09-25 DIAGNOSIS — I700 Atherosclerosis of aorta: Secondary | ICD-10-CM | POA: Diagnosis present

## 2020-09-25 DIAGNOSIS — E875 Hyperkalemia: Secondary | ICD-10-CM | POA: Diagnosis present

## 2020-09-25 DIAGNOSIS — Z79890 Hormone replacement therapy (postmenopausal): Secondary | ICD-10-CM

## 2020-09-25 DIAGNOSIS — Z91030 Bee allergy status: Secondary | ICD-10-CM

## 2020-09-25 DIAGNOSIS — G4700 Insomnia, unspecified: Secondary | ICD-10-CM | POA: Diagnosis present

## 2020-09-25 DIAGNOSIS — Z87891 Personal history of nicotine dependence: Secondary | ICD-10-CM

## 2020-09-25 DIAGNOSIS — M47812 Spondylosis without myelopathy or radiculopathy, cervical region: Secondary | ICD-10-CM | POA: Diagnosis present

## 2020-09-25 DIAGNOSIS — G8929 Other chronic pain: Secondary | ICD-10-CM | POA: Diagnosis present

## 2020-09-25 DIAGNOSIS — U071 COVID-19: Secondary | ICD-10-CM | POA: Diagnosis not present

## 2020-09-25 DIAGNOSIS — K219 Gastro-esophageal reflux disease without esophagitis: Secondary | ICD-10-CM | POA: Diagnosis present

## 2020-09-25 DIAGNOSIS — A0839 Other viral enteritis: Secondary | ICD-10-CM | POA: Diagnosis present

## 2020-09-25 DIAGNOSIS — G4733 Obstructive sleep apnea (adult) (pediatric): Secondary | ICD-10-CM | POA: Diagnosis present

## 2020-09-25 DIAGNOSIS — I480 Paroxysmal atrial fibrillation: Secondary | ICD-10-CM | POA: Diagnosis present

## 2020-09-25 DIAGNOSIS — F32A Depression, unspecified: Secondary | ICD-10-CM | POA: Diagnosis present

## 2020-09-25 DIAGNOSIS — I10 Essential (primary) hypertension: Secondary | ICD-10-CM | POA: Diagnosis present

## 2020-09-25 DIAGNOSIS — I482 Chronic atrial fibrillation, unspecified: Secondary | ICD-10-CM

## 2020-09-25 DIAGNOSIS — I5033 Acute on chronic diastolic (congestive) heart failure: Secondary | ICD-10-CM | POA: Diagnosis present

## 2020-09-25 DIAGNOSIS — M316 Other giant cell arteritis: Secondary | ICD-10-CM | POA: Diagnosis present

## 2020-09-25 DIAGNOSIS — M170 Bilateral primary osteoarthritis of knee: Secondary | ICD-10-CM | POA: Diagnosis present

## 2020-09-25 DIAGNOSIS — Z9049 Acquired absence of other specified parts of digestive tract: Secondary | ICD-10-CM

## 2020-09-25 DIAGNOSIS — N1831 Chronic kidney disease, stage 3a: Secondary | ICD-10-CM | POA: Diagnosis present

## 2020-09-25 DIAGNOSIS — Z96643 Presence of artificial hip joint, bilateral: Secondary | ICD-10-CM | POA: Diagnosis present

## 2020-09-25 DIAGNOSIS — Z79899 Other long term (current) drug therapy: Secondary | ICD-10-CM

## 2020-09-25 DIAGNOSIS — E871 Hypo-osmolality and hyponatremia: Secondary | ICD-10-CM | POA: Diagnosis present

## 2020-09-25 DIAGNOSIS — N179 Acute kidney failure, unspecified: Secondary | ICD-10-CM | POA: Diagnosis present

## 2020-09-25 DIAGNOSIS — Z6841 Body Mass Index (BMI) 40.0 and over, adult: Secondary | ICD-10-CM

## 2020-09-25 DIAGNOSIS — Z1211 Encounter for screening for malignant neoplasm of colon: Secondary | ICD-10-CM

## 2020-09-25 DIAGNOSIS — I959 Hypotension, unspecified: Secondary | ICD-10-CM | POA: Diagnosis not present

## 2020-09-25 DIAGNOSIS — I130 Hypertensive heart and chronic kidney disease with heart failure and stage 1 through stage 4 chronic kidney disease, or unspecified chronic kidney disease: Secondary | ICD-10-CM | POA: Diagnosis present

## 2020-09-25 DIAGNOSIS — Z7901 Long term (current) use of anticoagulants: Secondary | ICD-10-CM

## 2020-09-25 DIAGNOSIS — I5032 Chronic diastolic (congestive) heart failure: Secondary | ICD-10-CM | POA: Diagnosis present

## 2020-09-25 LAB — CBC WITH DIFFERENTIAL/PLATELET
Abs Immature Granulocytes: 0.02 10*3/uL (ref 0.00–0.07)
Basophils Absolute: 0 10*3/uL (ref 0.0–0.1)
Basophils Relative: 0 %
Eosinophils Absolute: 0 10*3/uL (ref 0.0–0.5)
Eosinophils Relative: 0 %
HCT: 49.8 % (ref 39.0–52.0)
Hemoglobin: 15.3 g/dL (ref 13.0–17.0)
Immature Granulocytes: 0 %
Lymphocytes Relative: 16 %
Lymphs Abs: 0.7 10*3/uL (ref 0.7–4.0)
MCH: 31.9 pg (ref 26.0–34.0)
MCHC: 30.7 g/dL (ref 30.0–36.0)
MCV: 103.8 fL — ABNORMAL HIGH (ref 80.0–100.0)
Monocytes Absolute: 0.4 10*3/uL (ref 0.1–1.0)
Monocytes Relative: 9 %
Neutro Abs: 3.5 10*3/uL (ref 1.7–7.7)
Neutrophils Relative %: 75 %
Platelets: 143 10*3/uL — ABNORMAL LOW (ref 150–400)
RBC: 4.8 MIL/uL (ref 4.22–5.81)
RDW: 13.6 % (ref 11.5–15.5)
WBC: 4.7 10*3/uL (ref 4.0–10.5)
nRBC: 0 % (ref 0.0–0.2)

## 2020-09-25 LAB — COMPREHENSIVE METABOLIC PANEL
ALT: 27 U/L (ref 0–44)
AST: 35 U/L (ref 15–41)
Albumin: 3.7 g/dL (ref 3.5–5.0)
Alkaline Phosphatase: 74 U/L (ref 38–126)
Anion gap: 10 (ref 5–15)
BUN: 29 mg/dL — ABNORMAL HIGH (ref 8–23)
CO2: 19 mmol/L — ABNORMAL LOW (ref 22–32)
Calcium: 9.1 mg/dL (ref 8.9–10.3)
Chloride: 104 mmol/L (ref 98–111)
Creatinine, Ser: 1.86 mg/dL — ABNORMAL HIGH (ref 0.61–1.24)
GFR, Estimated: 38 mL/min — ABNORMAL LOW (ref >60)
Glucose, Bld: 145 mg/dL — ABNORMAL HIGH (ref 70–99)
Potassium: 5.7 mmol/L — ABNORMAL HIGH (ref 3.5–5.1)
Sodium: 133 mmol/L — ABNORMAL LOW (ref 135–145)
Total Bilirubin: 0.4 mg/dL (ref 0.3–1.2)
Total Protein: 7.9 g/dL (ref 6.5–8.1)

## 2020-09-25 LAB — PROTIME-INR
INR: 1.5 — ABNORMAL HIGH (ref 0.8–1.2)
Prothrombin Time: 17.3 seconds — ABNORMAL HIGH (ref 11.4–15.2)

## 2020-09-25 LAB — FERRITIN: Ferritin: 554 ng/mL — ABNORMAL HIGH (ref 24–336)

## 2020-09-25 LAB — POC OCCULT BLOOD, ED: Fecal Occult Bld: POSITIVE — AB

## 2020-09-25 LAB — HEMOGLOBIN AND HEMATOCRIT, BLOOD
HCT: 44.6 % (ref 39.0–52.0)
Hemoglobin: 14.1 g/dL (ref 13.0–17.0)

## 2020-09-25 LAB — D-DIMER, QUANTITATIVE: D-Dimer, Quant: 0.59 ug/mL-FEU — ABNORMAL HIGH (ref 0.00–0.50)

## 2020-09-25 LAB — TYPE AND SCREEN
ABO/RH(D): A POS
Antibody Screen: POSITIVE

## 2020-09-25 LAB — FIBRINOGEN: Fibrinogen: 534 mg/dL — ABNORMAL HIGH (ref 210–475)

## 2020-09-25 LAB — LACTATE DEHYDROGENASE: LDH: 123 U/L (ref 98–192)

## 2020-09-25 LAB — TROPONIN I (HIGH SENSITIVITY)
Troponin I (High Sensitivity): 4 ng/L (ref <18)
Troponin I (High Sensitivity): 4 ng/L (ref <18)

## 2020-09-25 LAB — BRAIN NATRIURETIC PEPTIDE: B Natriuretic Peptide: 71 pg/mL (ref 0.0–100.0)

## 2020-09-25 LAB — SARS CORONAVIRUS 2 BY RT PCR (HOSPITAL ORDER, PERFORMED IN ~~LOC~~ HOSPITAL LAB): SARS Coronavirus 2: POSITIVE — AB

## 2020-09-25 LAB — C-REACTIVE PROTEIN: CRP: 3 mg/dL — ABNORMAL HIGH (ref <1.0)

## 2020-09-25 LAB — AMMONIA: Ammonia: 10 umol/L (ref 9–35)

## 2020-09-25 MED ORDER — SODIUM CHLORIDE 0.9% FLUSH
3.0000 mL | Freq: Two times a day (BID) | INTRAVENOUS | Status: DC
  Administered 2020-09-25 – 2020-09-28 (×6): 3 mL via INTRAVENOUS

## 2020-09-25 MED ORDER — ONDANSETRON HCL 4 MG PO TABS: 4.0000 mg | ORAL_TABLET | Freq: Four times a day (QID) | ORAL | Status: DC | PRN

## 2020-09-25 MED ORDER — DIPHENHYDRAMINE HCL 25 MG PO CAPS: 50.0000 mg | ORAL_CAPSULE | Freq: Four times a day (QID) | ORAL | Status: DC | PRN

## 2020-09-25 MED ORDER — THIAMINE HCL 100 MG PO TABS
100.0000 mg | ORAL_TABLET | Freq: Every day | ORAL | Status: DC
  Administered 2020-09-26 – 2020-09-28 (×3): 100 mg via ORAL
  Filled 2020-09-25 (×3): qty 1

## 2020-09-25 MED ORDER — ONDANSETRON HCL 4 MG/2ML IJ SOLN
4.0000 mg | Freq: Four times a day (QID) | INTRAMUSCULAR | Status: DC | PRN
  Administered 2020-09-26 – 2020-09-28 (×4): 4 mg via INTRAVENOUS
  Filled 2020-09-25 (×4): qty 2

## 2020-09-25 MED ORDER — MELATONIN 3 MG PO TABS
6.0000 mg | ORAL_TABLET | Freq: Every day | ORAL | Status: DC
  Administered 2020-09-25 – 2020-09-27 (×3): 6 mg via ORAL
  Filled 2020-09-25 (×3): qty 2

## 2020-09-25 MED ORDER — SODIUM CHLORIDE 0.9 % IV BOLUS (SEPSIS)
500.0000 mL | Freq: Once | INTRAVENOUS | Status: AC
  Administered 2020-09-25: 500 mL via INTRAVENOUS

## 2020-09-25 MED ORDER — ROPINIROLE HCL 0.25 MG PO TABS
0.2500 mg | ORAL_TABLET | Freq: Every day | ORAL | Status: DC
  Administered 2020-09-25 – 2020-09-27 (×3): 0.25 mg via ORAL
  Filled 2020-09-25 (×3): qty 1

## 2020-09-25 MED ORDER — FERROUS SULFATE 325 (65 FE) MG PO TABS
325.0000 mg | ORAL_TABLET | Freq: Three times a day (TID) | ORAL | Status: DC
  Administered 2020-09-26 – 2020-09-28 (×8): 325 mg via ORAL
  Filled 2020-09-25 (×8): qty 1

## 2020-09-25 MED ORDER — OXYCODONE HCL 5 MG PO TABS
5.0000 mg | ORAL_TABLET | Freq: Four times a day (QID) | ORAL | Status: DC | PRN
  Administered 2020-09-26 – 2020-09-28 (×6): 5 mg via ORAL
  Filled 2020-09-25 (×6): qty 1

## 2020-09-25 MED ORDER — ESCITALOPRAM OXALATE 10 MG PO TABS
10.0000 mg | ORAL_TABLET | Freq: Every day | ORAL | Status: DC
  Administered 2020-09-26 – 2020-09-28 (×3): 10 mg via ORAL
  Filled 2020-09-25 (×3): qty 1

## 2020-09-25 MED ORDER — SODIUM ZIRCONIUM CYCLOSILICATE 5 G PO PACK
10.0000 g | PACK | Freq: Once | ORAL | Status: AC
  Administered 2020-09-25: 10 g via ORAL
  Filled 2020-09-25: qty 2

## 2020-09-25 MED ORDER — PANTOPRAZOLE SODIUM 40 MG IV SOLR
40.0000 mg | Freq: Two times a day (BID) | INTRAVENOUS | Status: DC
  Administered 2020-09-26 – 2020-09-28 (×5): 40 mg via INTRAVENOUS
  Filled 2020-09-25 (×5): qty 40

## 2020-09-25 MED ORDER — PANTOPRAZOLE SODIUM 40 MG IV SOLR
40.0000 mg | Freq: Once | INTRAVENOUS | Status: AC
  Administered 2020-09-25: 40 mg via INTRAVENOUS
  Filled 2020-09-25: qty 40

## 2020-09-25 MED ORDER — POLYETHYLENE GLYCOL 3350 17 G PO PACK: 17.0000 g | PACK | Freq: Every day | ORAL | Status: DC | PRN

## 2020-09-25 MED ORDER — TRAZODONE HCL 50 MG PO TABS
50.0000 mg | ORAL_TABLET | Freq: Every evening | ORAL | Status: DC | PRN
  Administered 2020-09-26: 50 mg via ORAL
  Filled 2020-09-25 (×2): qty 1

## 2020-09-25 MED ORDER — HYPROMELLOSE (GONIOSCOPIC) 2.5 % OP SOLN: 1.0000 [drp] | Freq: Every day | OPHTHALMIC | Status: DC | PRN

## 2020-09-25 MED ORDER — ALBUTEROL SULFATE HFA 108 (90 BASE) MCG/ACT IN AERS: 1.0000 | INHALATION_SPRAY | Freq: Four times a day (QID) | RESPIRATORY_TRACT | Status: DC | PRN

## 2020-09-25 MED ORDER — CALCIUM CARBONATE ANTACID 500 MG PO CHEW: 1000.0000 mg | CHEWABLE_TABLET | Freq: Three times a day (TID) | ORAL | Status: DC | PRN

## 2020-09-25 MED ORDER — ACETAMINOPHEN 650 MG RE SUPP: 650.0000 mg | Freq: Four times a day (QID) | RECTAL | Status: DC | PRN

## 2020-09-25 MED ORDER — ACETAMINOPHEN 325 MG PO TABS
650.0000 mg | ORAL_TABLET | Freq: Four times a day (QID) | ORAL | Status: DC | PRN
  Administered 2020-09-26 – 2020-09-28 (×3): 650 mg via ORAL
  Filled 2020-09-25 (×3): qty 2

## 2020-09-25 MED ORDER — PANTOPRAZOLE SODIUM 40 MG PO TBEC: 40.0000 mg | DELAYED_RELEASE_TABLET | Freq: Every day | ORAL | Status: DC

## 2020-09-25 MED ORDER — GABAPENTIN 300 MG PO CAPS
300.0000 mg | ORAL_CAPSULE | Freq: Three times a day (TID) | ORAL | Status: DC
  Administered 2020-09-25 – 2020-09-28 (×9): 300 mg via ORAL
  Filled 2020-09-25 (×9): qty 1

## 2020-09-25 NOTE — ED Notes (Signed)
Pt medicated per MAR, dinner provided, lab at bedside. NAD noted. Denies further needs at this time.

## 2020-09-25 NOTE — ED Triage Notes (Signed)
Pt reports he almost passed on the toilet. Reports everything went black and it has been occurring mostly with exertion. Also been sweating when this occurs. Nausea with eating

## 2020-09-25 NOTE — ED Provider Notes (Signed)
[facility] EMERGENCY DEPARTMENT Provider Note   CSN: [id number] Arrival date & time: 09/25/20  1404     History Chief Complaint  Patient presents with  . Near Syncope    [name] is a 75 y.o. male.  Patient states for the last 4 days every time he exerts himself he gets dizzy almost passes out.  He was on the commode today after a bowel movement and came very close to passing out again.  Patient has a history of atrial fib obesity congestive heart failure and GERD.  He is taking Xarelto  The history is provided by the patient and medical records. No language interpreter was used.  Near Syncope This is a new problem. The problem occurs daily. The problem has not changed since onset.Pertinent negatives include no chest pain, no abdominal pain and no headaches. Exacerbated by: Exerting himself. He has tried nothing for the symptoms. The treatment provided no relief.       Past Medical History:  Diagnosis Date  . Anginal pain ([place]) 2006   evaluated by cardio  . Arthritis    knees,feet,shoulders,elbows.hands  . Atrial fibrillation ([place])   . CHF (congestive heart failure) ([place])   . Constipation   . Dyspnea    with exertion   . Dysrhythmia    Atrial Flutter- 2006- corredted itself  . Family history of adverse reaction to anesthesia    mother- with novocaine went into shock  . GERD (gastroesophageal reflux disease)   . Headache   . Hemorrhoids   . History of blood transfusion   . Hypotension   . Insomnia   . Sleep apnea    cpap  . 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 Near syncope 09/25/2020  . Goals of care, counseling/discussion   . Palliative care encounter   . Hyperkalemia 10/01/2018  . Other cirrhosis of liver ([place]) 10/01/2018  . Chronic back pain 10/01/2018  . Cellulitis of left hip   . 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 Physical debility 09/04/2018  . Pleural effusion,  bilateral   . Hypertensive urgency 08/17/2018  . Pleural effusion on left 08/17/2018  . Chronic diastolic CHF (congestive heart failure) ([place]) 08/17/2018  . OSA on CPAP 08/17/2018  . Unspecified atrial fibrillation ([place])   . Delirium   . Depression   . OSA (obstructive sleep apnea)   . Obesity, Class III, BMI 40-49.9 (morbid obesity) ([place])   . Atrial fibrillation with RVR ([place]) 04/16/2018  . Morbid obesity with BMI of 50.0-59.9, adult ([place]) 04/16/2018  . AKI (acute kidney injury) ([place]) 04/16/2018  . Visual hallucination 04/16/2018  . SIRS (systemic inflammatory response syndrome) ([place]) 04/16/2018  . Anemia of chronic disease 04/16/2018  . Left hip postoperative wound infection 03/15/2018  . 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 Femur fracture, left ([place]) 02/15/2018  . Anemia 02/15/2018  . Femur fracture ([place]) 02/15/2018  . 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 SOB (shortness of breath) 03/31/2014  . Hypertension   . Hyponatremia 03/18/2012  . 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[place]) 03/17/2012    Past Surgical History:  Procedure Laterality Date  . APPENDECTOMY    . ARTERY BIOPSY Left 12/30/2017   Procedure: BIOPSY TEMPORAL ARTERY;  Surgeon: Fanny Skates, MD;  Location: [facility];  Service: General;  Laterality: Left;  . CHOLECYSTECTOMY    . COLONOSCOPY W/ POLYPECTOMY    .  ESOPHAGOGASTRODUODENOSCOPY (EGD) WITH PROPOFOL  07/06/2012   Procedure: ESOPHAGOGASTRODUODENOSCOPY (EGD) WITH PROPOFOL;  Surgeon: Jeryl [place], MD;  Location: [facility];  Service: Endoscopy;  Laterality: N/A;  . ESOPHAGOSCOPY    . EYE SURGERY     left eye- muscle repair  . HERNIA REPAIR     ventral hernia  . INCISION AND DRAINAGE HIP Left 03/17/2018   Procedure: IRRIGATION AND DEBRIDEMENT LEFT THIGH WOUND;  Surgeon: Gaynelle Arabian, MD;  Location: [facility];  Service: Orthopedics;  Laterality: Left;  . JOINT REPLACEMENT     bilateral hips.  Right broke and had to be re  placed again  . MASS EXCISION N/A 03/02/2015   Procedure: EXCISION ABDOMINAL WALL MASS;  Surgeon: Alphonsa Overall, MD;  Location: [facility];  Service: General;  Laterality: N/A;  . TOTAL HIP REVISION Left 02/17/2018   Procedure: Left total hip arthroplasty revision;  Surgeon: Gaynelle Arabian, MD;  Location: [facility];  Service: Orthopedics;  Laterality: Left;  Marland Kitchen VERTICAL BANDED GASTROPLASTY         Family History  Problem Relation Age of Onset  . Cancer Mother   . Alcohol abuse Father     Social History   Tobacco Use  . Smoking status: Former Smoker    Packs/day: 2.00    Years: 10.00    Pack years: 20.00    Types: Cigarettes    Start date: 05/16/1966    Quit date: 03/11/1984    Years since quitting: 36.5  . Smokeless tobacco: Never Used  Vaping Use  . Vaping Use: Never used  Substance Use Topics  . Alcohol use: Yes    Alcohol/week: 0.0 standard drinks    Comment: occasionally a couple of times a year   . Drug use: No    Home Medications Prior to Admission medications   Medication Sig Start Date End Date Taking? Authorizing Provider  acetaminophen (TYLENOL) 325 MG tablet Take 2 tablets (650 mg total) by mouth every 6 (six) hours as needed for mild pain, fever or headache. 04/27/18  Yes Barton Dubois, MD  albuterol (PROVENTIL HFA;VENTOLIN HFA) 108 (90 Base) MCG/ACT inhaler Inhale 1-2 puffs into the lungs every 6 (six) hours as needed for wheezing or shortness of breath. 09/02/18  Yes Hongalgi, Lenis Dickinson, MD  calcium carbonate (TUMS - DOSED IN MG ELEMENTAL CALCIUM) 500 MG chewable tablet Chew 1,000 mg by mouth 3 (three) times daily as needed for indigestion.   Yes [provider]  carboxymethylcellulose (REFRESH TEARS) 0.5 % SOLN Place 1 drop into both eyes daily as needed (dry eye/irritation). 09/02/18  Yes Hongalgi, Lenis Dickinson, MD  diphenhydrAMINE (BENADRYL) 25 MG tablet Take 50 mg by mouth every 6 (six) hours as needed.   Yes [provider]  escitalopram (LEXAPRO) 10 MG tablet  Take 1 tablet (10 mg total) by mouth daily. 09/02/18  Yes Hongalgi, Lenis Dickinson, MD  ferrous sulfate 325 (65 FE) MG tablet Take 1 tablet (325 mg total) by mouth 3 (three) times daily with meals. 09/02/18  Yes Hongalgi, Lenis Dickinson, MD  gabapentin (NEURONTIN) 300 MG capsule Take 1 capsule (300 mg total) by mouth 3 (three) times daily. Patient taking differently: Take 300 mg by mouth See admin instructions. Take 2 capsules every morning and 3 capsules at bedtime 12/07/18  Yes Thurnell Lose, MD  Melatonin 3 MG TABS Take 2 tablets (6 mg total) by mouth at bedtime. 09/11/18  Yes Bonnielee Haff, MD  Multiple Vitamins-Minerals (CENTRUM ADULTS PO) Take 1 tablet by mouth  every evening.    Yes [provider]  oxyCODONE (OXY IR/ROXICODONE) 5 MG immediate release tablet Take 1 tablet (5 mg total) by mouth every 6 (six) hours as needed for moderate pain, severe pain or breakthrough pain. 12/07/18  Yes Thurnell Lose, MD  pantoprazole (PROTONIX) 40 MG tablet Take 1 tablet (40 mg total) by mouth daily. 09/02/18  Yes Hongalgi, Lenis Dickinson, MD  rOPINIRole (REQUIP) 0.25 MG tablet Take 1 tablet (0.25 mg total) by mouth at bedtime. 09/02/18  Yes Hongalgi, Lenis Dickinson, MD  spironolactone (ALDACTONE) 100 MG tablet Take 100 mg by mouth daily.   Yes [provider]  spironolactone (ALDACTONE) 50 MG tablet Take 100mg  every morning and 50mg  every evening Patient taking differently: Take 50 mg by mouth every evening. 09/11/18  Yes Bonnielee Haff, MD  thiamine 100 MG tablet Take 100 mg by mouth daily. 07/15/18  Yes [provider]  traZODone (DESYREL) 50 MG tablet Take 1 tablet (50 mg total) by mouth at bedtime as needed for sleep. 11/26/18  Yes Ghimire, Dante Gang, MD  XARELTO 20 MG TABS tablet Take 20 mg by mouth every evening. 09/18/20  Yes [provider]  apixaban (ELIQUIS) 5 MG TABS tablet Take 1 tablet (5 mg total) by mouth 2 (two) times daily. Patient not taking: No sig reported 09/02/18   Modena Jansky, MD   cycloSPORINE (RESTASIS) 0.05 % ophthalmic emulsion Place 1 drop into both eyes 2 (two) times daily. Patient not taking: No sig reported 09/02/18   Modena Jansky, MD  diltiazem (CARDIZEM CD) 120 MG 24 hr capsule Take 1 capsule (120 mg total) by mouth daily. Patient not taking: Reported on 09/25/2020 12/07/18   Thurnell Lose, MD  doxycycline (VIBRAMYCIN) 100 MG capsule Take 1 capsule (100 mg total) by mouth 2 (two) times daily. Patient not taking: Reported on 09/25/2020 07/12/19   Noemi Chapel, MD  Ensure Max Protein (ENSURE MAX PROTEIN) LIQD Take 330 mLs (11 oz total) by mouth 2 (two) times daily. Patient not taking: Reported on 09/25/2020 12/07/18   Thurnell Lose, MD  hydrOXYzine (ATARAX/VISTARIL) 25 MG tablet Take 1 tablet (25 mg total) by mouth daily as needed for anxiety. 11/26/18   Barb Merino, MD  lactulose (CHRONULAC) 10 GM/15ML solution Take 45 mLs (30 g total) by mouth 2 (two) times daily. Patient not taking: Reported on 09/25/2020 12/07/18   Thurnell Lose, MD  lidocaine (LIDODERM) 5 % Place 1 patch onto the skin daily. Remove & Discard patch within 12 hours or as directed by MD Patient not taking: Reported on 09/25/2020 09/11/18   Bonnielee Haff, MD  midodrine (PROAMATINE) 10 MG tablet Take 1 tablet (10 mg total) by mouth 3 (three) times daily with meals. Patient not taking: Reported on 09/25/2020 12/07/18   Thurnell Lose, MD  polyethylene glycol [facility] / Floria Raveling) packet Take 17 g by mouth daily as needed for mild constipation. 09/02/18   Hongalgi, Lenis Dickinson, MD  senna-docusate (SENOKOT-S) 8.6-50 MG tablet Take 2 tablets by mouth 2 (two) times daily. Patient not taking: No sig reported 09/02/18   Modena Jansky, MD  valACYclovir (VALTREX) 1000 MG tablet Take 1 tablet (1,000 mg total) by mouth every 8 (eight) hours. Patient not taking: Reported on 09/25/2020 12/07/18   Thurnell Lose, MD    Allergies    Bee venom and Nickel  Review of Systems   Review of Systems   Constitutional: Negative for appetite change and fatigue.  HENT: Negative  for congestion, ear discharge and sinus pressure.   Eyes: Negative for discharge.  Respiratory: Negative for cough.   Cardiovascular: Positive for near-syncope. Negative for chest pain.  Gastrointestinal: Negative for abdominal pain and diarrhea.  Genitourinary: Negative for frequency and hematuria.  Musculoskeletal: Negative for back pain.  Skin: Negative for rash.  Neurological: Positive for dizziness. Negative for seizures and headaches.  Psychiatric/Behavioral: Negative for hallucinations.    Physical Exam Updated Vital Signs BP 128/78   Pulse (!) 101   Temp 97.7 F (36.5 C) (Oral)   Resp 19   Ht 5\' 8"  (1.727 m)   Wt 136.1 kg   SpO2 100%   BMI 45.61 kg/m   Physical Exam Vitals and nursing note reviewed.  Constitutional:      Appearance: He is well-developed.  HENT:     Head: Normocephalic.     Nose: Nose normal.  Eyes:     General: No scleral icterus.    Extraocular Movements: EOM normal.     Conjunctiva/sclera: Conjunctivae normal.  Neck:     Thyroid: No thyromegaly.  Cardiovascular:     Rate and Rhythm: Normal rate and regular rhythm.     Heart sounds: No murmur heard. No friction rub. No gallop.   Pulmonary:     Breath sounds: No stridor. No wheezing or rales.  Chest:     Chest wall: No tenderness.  Abdominal:     General: There is no distension.     Tenderness: There is no abdominal tenderness. There is no rebound.  Genitourinary:    Comments: Rectal exam shows dark stool heme positive Musculoskeletal:        General: No edema. Normal range of motion.     Cervical back: Neck supple.  Lymphadenopathy:     Cervical: No cervical adenopathy.  Skin:    Findings: No erythema or rash.  Neurological:     Mental Status: He is alert and oriented to person, place, and time.     Motor: No abnormal muscle tone.     Coordination: Coordination normal.  Psychiatric:        Mood and  Affect: Mood and affect normal.        Behavior: Behavior normal.     ED Results / Procedures / Treatments   Labs (all labs ordered are listed, but only abnormal results are displayed) Labs Reviewed  SARS CORONAVIRUS 2 BY RT PCR (HOSPITAL ORDER, [facility]) - Abnormal; Notable for the following components:      Result Value   SARS Coronavirus 2 POSITIVE (*)    All other components within normal limits  CBC WITH DIFFERENTIAL/PLATELET - Abnormal; Notable for the following components:   MCV 103.8 (*)    Platelets 143 (*)    All other components within normal limits  COMPREHENSIVE METABOLIC PANEL - Abnormal; Notable for the following components:   Sodium 133 (*)    Potassium 5.7 (*)    CO2 19 (*)    Glucose, Bld 145 (*)    BUN 29 (*)    Creatinine, Ser 1.86 (*)    GFR, Estimated 38 (*)    All other components within normal limits  POC OCCULT BLOOD, ED - Abnormal; Notable for the following components:   Fecal Occult Bld POSITIVE (*)    All other components within normal limits  TYPE AND SCREEN  TROPONIN I (HIGH SENSITIVITY)  TROPONIN I (HIGH SENSITIVITY)    EKG EKG Interpretation  Date/Time:  Tuesday September 25 2020 16:19:12 EST Ventricular Rate:  100 PR Interval:    QRS Duration: 83 QT Interval:  334 QTC Calculation: 431 R Axis:   112 Text Interpretation: Atrial fibrillation Low voltage, precordial leads Probable right ventricular hypertrophy Confirmed by Milton Ferguson [phone]) on 09/25/2020 6:53:46 PM   Radiology DG Chest Port 1 View  Result Date: 09/25/2020 CLINICAL DATA:  Weakness EXAM: PORTABLE CHEST 1 VIEW COMPARISON:  07/12/2019 FINDINGS: Right lung is clear. Chronic pleural changes on the left. Stable cardiomediastinal silhouette with aortic atherosclerosis. No pneumothorax IMPRESSION: No active disease. Chronic pleural changes on the left. Electronically Signed   By: Donavan Foil M.D.   On: 09/25/2020 16:53    Procedures Procedures    Medications Ordered in ED Medications  sodium chloride 0.9 % bolus 500 mL (0 mLs Intravenous Stopped 09/25/20 1735)  pantoprazole (PROTONIX) injection 40 mg (40 mg Intravenous Given 09/25/20 1743)    ED Course  I have reviewed the triage vital signs and the nursing notes.  Pertinent labs & imaging results that were available during my care of the patient were reviewed by me and considered in my medical decision making (see chart for details).    MDM Rules/Calculators/A&P                          Patient with hyperkalemia heme positive stool new diagnosis of Covid.  Patient with persistent dizziness with exertion.  He will be admitted to medicine Final Clinical Impression(s) / ED Diagnoses Final diagnoses:  Near syncope  COVID-19    Rx / DC Orders ED Discharge Orders    None       Milton Ferguson, MD 09/25/20 1930

## 2020-09-25 NOTE — H&P (Signed)
History and Physical    [name] ZOX:[id number] DOB: April 28, 1946 DOA: 09/25/2020  PCP: [facility], [facility]   Patient coming from: Home.   I have personally briefly reviewed patient's old medical records in [place]  Chief Complaint: Almost passed out.  HPI: [name] is a 75 y.o. 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  The patient states that his vision turned black and he almost passed out.  Not feeling well for the past 3 to 4 days.  He denies fever, but has had chills, day and night sweats, decreased appetite, nausea without emesis, fatigue, malaise, generalized weakness, occasional dry cough, but denies dyspnea, wheezing, hemoptysis, chest pain, palpitations, PND, orthopnea or recent pitting edema of the lower extremities.  Denies abdominal pain, diarrhea, melena or hematochezia.  No dysuria, frequency or nocturia.  No polyuria, polydipsia, polyphagia or blurred vision.  ED Course: Initial vital signs were temperature 97.7 F, pulse 101, respirations 16, BP 86/59 mmHg and O2 sat 97% on room air.  The patient received a 500 mL bolus and 40 mg of Protonix IVP.  I added Lokelma 10 g p.o. x1 dose for hyperkalemia.  Lab work: CBC showed a white count 4.7 with 75% neutrophils, 16% lymphocytes and 9% monocytes.  Hemoglobin 15.3 g/dL with an MCV of 103.8 fL.  D-dimer was 0.59 mcg/mL and fibrinogen 534 mg/dL.  PT was 17.3 and INR 1.5.  Platelets were 143.  CMP showed a sodium of 133, potassium 5.7, chloride 104 and CO2 19 mmol/L.  Anion gap was 10.  Glucose  145, BUN 29 creatinine 1.86 mg/dL (baseline around 1.6) with an estimated GFR of 38 mL/min.  Troponin was 4 ng/L x 2.  BNP 71.0 pg/mL.  Ammonia was 10 mol/L.  LDH 123 units/L.  CRP 3.0 mg/dL.  Ferritin was 554 ng/mL.  Procalcitonin was less than 0.10 ng/mL.  SARS by RT-PCR was positive.  Fecal occult blood was positive.  Imaging: Portable 1 view chest radiograph shows chronic pleural changes on the left with aortic atherosclerosis.  Right lung was clear.  There was no pneumothorax old no active cardiopulmonary disease.  Review of Systems: As per HPI otherwise all other systems reviewed and are negative.  Past Medical History:  Diagnosis Date  . Anginal pain ([place]) 2006   evaluated by cardio  . Arthritis    knees,feet,shoulders,elbows.hands  . Atrial fibrillation ([place])   . CHF (congestive heart failure) ([place])   . Constipation   . Dyspnea    with exertion   . Dysrhythmia    Atrial Flutter- 2006- corredted itself  . Family history of adverse reaction to anesthesia    mother- with novocaine went into shock  . GERD (gastroesophageal reflux disease)   . Headache   . Hemorrhoids   . History of blood transfusion   . Hypotension   . Insomnia   . Sleep apnea    cpap  . Spondylosis of cervical region without myelopathy or radiculopathy 04/30/2017   Formatting of this note might be different from the original. Added automatically from request for surgery [id number]  . Temporal giant cell arteritis ([place]) 12/30/2017    Past  Surgical History:  Procedure Laterality Date  . APPENDECTOMY    . ARTERY BIOPSY Left 12/30/2017   Procedure: BIOPSY TEMPORAL ARTERY;  Surgeon: Fanny Skates, MD;  Location: [facility];  Service: General;  Laterality: Left;  . CHOLECYSTECTOMY    . COLONOSCOPY W/ POLYPECTOMY    . ESOPHAGOGASTRODUODENOSCOPY (EGD) WITH PROPOFOL  07/06/2012   Procedure: ESOPHAGOGASTRODUODENOSCOPY (EGD) WITH PROPOFOL;  Surgeon: Jeryl Columbia, MD;  Location: [facility];  Service: Endoscopy;   Laterality: N/A;  . ESOPHAGOSCOPY    . EYE SURGERY     left eye- muscle repair  . HERNIA REPAIR     ventral hernia  . INCISION AND DRAINAGE HIP Left 03/17/2018   Procedure: IRRIGATION AND DEBRIDEMENT LEFT THIGH WOUND;  Surgeon: Gaynelle Arabian, MD;  Location: [facility];  Service: Orthopedics;  Laterality: Left;  . JOINT REPLACEMENT     bilateral hips.  Right broke and had to be re placed again  . MASS EXCISION N/A 03/02/2015   Procedure: EXCISION ABDOMINAL WALL MASS;  Surgeon: Alphonsa Overall, MD;  Location: [facility];  Service: General;  Laterality: N/A;  . TOTAL HIP REVISION Left 02/17/2018   Procedure: Left total hip arthroplasty revision;  Surgeon: Gaynelle Arabian, MD;  Location: [facility];  Service: Orthopedics;  Laterality: Left;  Marland Kitchen VERTICAL BANDED GASTROPLASTY      Social History  reports that he quit smoking about 36 years ago. His smoking use included cigarettes. He started smoking about 54 years ago. He has a 20.00 pack-year smoking history. He has never used smokeless tobacco. He reports current alcohol use. He reports that he does not use drugs.  Allergies  Allergen Reactions  . Bee Venom Swelling  . Nickel Rash   Family History  Problem Relation Age of Onset  . Cancer Mother   . Alcohol abuse Father    Prior to Admission medications   Medication Sig Start Date End Date Taking? Authorizing Provider  acetaminophen (TYLENOL) 325 MG tablet Take 2 tablets (650 mg total) by mouth every 6 (six) hours as needed for mild pain, fever or headache. 04/27/18  Yes Barton Dubois, MD  albuterol (PROVENTIL HFA;VENTOLIN HFA) 108 (90 Base) MCG/ACT inhaler Inhale 1-2 puffs into the lungs every 6 (six) hours as needed for wheezing or shortness of breath. 09/02/18  Yes Hongalgi, Lenis Dickinson, MD  calcium carbonate (TUMS - DOSED IN MG ELEMENTAL CALCIUM) 500 MG chewable tablet Chew 1,000 mg by mouth 3 (three) times daily as needed for indigestion.   Yes [provider]  carboxymethylcellulose (REFRESH TEARS)  0.5 % SOLN Place 1 drop into both eyes daily as needed (dry eye/irritation). 09/02/18  Yes Hongalgi, Lenis Dickinson, MD  diphenhydrAMINE (BENADRYL) 25 MG tablet Take 50 mg by mouth every 6 (six) hours as needed.   Yes [provider]  escitalopram (LEXAPRO) 10 MG tablet Take 1 tablet (10 mg total) by mouth daily. 09/02/18  Yes Hongalgi, Lenis Dickinson, MD  ferrous sulfate 325 (65 FE) MG tablet Take 1 tablet (325 mg total) by mouth 3 (three) times daily with meals. 09/02/18  Yes Hongalgi, Lenis Dickinson, MD  gabapentin (NEURONTIN) 300 MG capsule Take 1 capsule (300 mg total) by mouth 3 (three) times daily. Patient taking differently: Take 300 mg by mouth See admin instructions. Take 2 capsules every morning and 3 capsules at bedtime 12/07/18  Yes Thurnell Lose, MD  Melatonin 3 MG TABS Take 2 tablets (6 mg total) by mouth at bedtime. 09/11/18  Yes Bonnielee Haff, MD  Multiple  Vitamins-Minerals (CENTRUM ADULTS PO) Take 1 tablet by mouth every evening.    Yes [provider]  oxyCODONE (OXY IR/ROXICODONE) 5 MG immediate release tablet Take 1 tablet (5 mg total) by mouth every 6 (six) hours as needed for moderate pain, severe pain or breakthrough pain. 12/07/18  Yes Thurnell Lose, MD  pantoprazole (PROTONIX) 40 MG tablet Take 1 tablet (40 mg total) by mouth daily. 09/02/18  Yes Hongalgi, Lenis Dickinson, MD  rOPINIRole (REQUIP) 0.25 MG tablet Take 1 tablet (0.25 mg total) by mouth at bedtime. 09/02/18  Yes Hongalgi, Lenis Dickinson, MD  spironolactone (ALDACTONE) 100 MG tablet Take 100 mg by mouth daily.   Yes [provider]  spironolactone (ALDACTONE) 50 MG tablet Take 100mg  every morning and 50mg  every evening Patient taking differently: Take 50 mg by mouth every evening. 09/11/18  Yes Bonnielee Haff, MD  thiamine 100 MG tablet Take 100 mg by mouth daily. 07/15/18  Yes [provider]  traZODone (DESYREL) 50 MG tablet Take 1 tablet (50 mg total) by mouth at bedtime as needed for sleep. 11/26/18  Yes Ghimire,  Dante Gang, MD  XARELTO 20 MG TABS tablet Take 20 mg by mouth every evening. 09/18/20  Yes [provider]  apixaban (ELIQUIS) 5 MG TABS tablet Take 1 tablet (5 mg total) by mouth 2 (two) times daily. Patient not taking: No sig reported 09/02/18   Modena Jansky, MD  cycloSPORINE (RESTASIS) 0.05 % ophthalmic emulsion Place 1 drop into both eyes 2 (two) times daily. Patient not taking: No sig reported 09/02/18   Modena Jansky, MD  diltiazem (CARDIZEM CD) 120 MG 24 hr capsule Take 1 capsule (120 mg total) by mouth daily. Patient not taking: Reported on 09/25/2020 12/07/18   Thurnell Lose, MD  doxycycline (VIBRAMYCIN) 100 MG capsule Take 1 capsule (100 mg total) by mouth 2 (two) times daily. Patient not taking: Reported on 09/25/2020 07/12/19   Noemi Chapel, MD  Ensure Max Protein (ENSURE MAX PROTEIN) LIQD Take 330 mLs (11 oz total) by mouth 2 (two) times daily. Patient not taking: Reported on 09/25/2020 12/07/18   Thurnell Lose, MD  hydrOXYzine (ATARAX/VISTARIL) 25 MG tablet Take 1 tablet (25 mg total) by mouth daily as needed for anxiety. 11/26/18   Barb Merino, MD  lactulose (CHRONULAC) 10 GM/15ML solution Take 45 mLs (30 g total) by mouth 2 (two) times daily. Patient not taking: Reported on 09/25/2020 12/07/18   Thurnell Lose, MD  lidocaine (LIDODERM) 5 % Place 1 patch onto the skin daily. Remove & Discard patch within 12 hours or as directed by MD Patient not taking: Reported on 09/25/2020 09/11/18   Bonnielee Haff, MD  midodrine (PROAMATINE) 10 MG tablet Take 1 tablet (10 mg total) by mouth 3 (three) times daily with meals. Patient not taking: Reported on 09/25/2020 12/07/18   Thurnell Lose, MD  polyethylene glycol [facility] Floria Raveling) packet Take 17 g by mouth daily as needed for mild constipation. 09/02/18   Hongalgi, Lenis Dickinson, MD  senna-docusate (SENOKOT-S) 8.6-50 MG tablet Take 2 tablets by mouth 2 (two) times daily. Patient not taking: No sig reported 09/02/18   Modena Jansky,  MD  valACYclovir (VALTREX) 1000 MG tablet Take 1 tablet (1,000 mg total) by mouth every 8 (eight) hours. Patient not taking: Reported on 09/25/2020 12/07/18   Thurnell Lose, MD   Physical Exam: Vitals:   09/25/20 1615 09/25/20 1730 09/25/20 1800 09/25/20 1900  BP: (!) 124/98 (!) 123/91 117/76  128/78  Pulse: 99 94 (!) 101 (!) 101  Resp: 19 16 17 19   Temp:      TempSrc:      SpO2: 94% 97% 97% 100%  Weight:      Height:       Constitutional: Looks acutely ill, but currently in NAD, calm, comfortable Eyes: PERRL, lids and conjunctivae mildly injected. ENMT: Mucous membranes are dry.. Posterior pharynx clear of any exudate or lesions. Neck: normal, supple, no masses, no thyromegaly Respiratory: Decreased breath sounds in bases, otherwise clear to auscultation bilaterally, no wheezing, no crackles. Normal respiratory effort. No accessory muscle use.  Cardiovascular: Tachycardic in the low 100 with a regular rhythm, no murmurs / rubs / gallops. No extremity edema. 2+ pedal pulses. No carotid bruits.  Abdomen: Obese, no distention.  Bowel sounds positive.  Soft, no tenderness, no masses palpated. No hepatosplenomegaly. Musculoskeletal: Moderate generalized weakness.  No clubbing / cyanosis.  Good ROM, no contractures. Normal muscle tone.  Skin: no rashes, lesions, ulcers on very limited dermatological examination. Neurologic: CN 2-12 grossly intact. Sensation intact, DTR normal. Strength 5/5 in all 4.  Psychiatric: Normal judgment and insight. Alert and oriented x 3. Normal mood.   Labs on Admission: I have personally reviewed following labs and imaging studies  CBC: Recent Labs  Lab 09/25/20 1635  WBC 4.7  NEUTROABS 3.5  HGB 15.3  HCT 49.8  MCV 103.8*  PLT 143*    Basic Metabolic Panel: Recent Labs  Lab 09/25/20 1635  NA 133*  K 5.7*  CL 104  CO2 19*  GLUCOSE 145*  BUN 29*  CREATININE 1.86*  CALCIUM 9.1    GFR: Estimated Creatinine Clearance: 47.1 mL/min (A) (by C-G  formula based on SCr of 1.86 mg/dL (H)).  Liver Function Tests: Recent Labs  Lab 09/25/20 1635  AST 35  ALT 27  ALKPHOS 74  BILITOT 0.4  PROT 7.9  ALBUMIN 3.7    Radiological Exams on Admission: DG Chest Port 1 View  Result Date: 09/25/2020 CLINICAL DATA:  Weakness EXAM: PORTABLE CHEST 1 VIEW COMPARISON:  07/12/2019 FINDINGS: Right lung is clear. Chronic pleural changes on the left. Stable cardiomediastinal silhouette with aortic atherosclerosis. No pneumothorax IMPRESSION: No active disease. Chronic pleural changes on the left. Electronically Signed   By: Donavan Foil M.D.   On: 09/25/2020 16:53   04/17/2018 Echocardiogram -------------------------------------------------------------------  LV EF: 50% -  55%   -------------------------------------------------------------------  Indications:   Atrial fibrillation - 427.31.   -------------------------------------------------------------------  History:  PMH: Former smoker. Anemia. Atrial fibrillation. Risk  factors: Hypertension.   -------------------------------------------------------------------  Study Conclusions   - Left ventricle: The cavity size was normal. There was moderate  concentric hypertrophy. Systolic function was normal. The  estimated ejection fraction was in the range of 50% to 55%.  Diffuse hypokinesis.  - Aortic valve: Trileaflet; normal thickness leaflets. There was no  regurgitation.  - Mitral valve: There was no regurgitation.  - Left atrium: The atrium was mildly dilated.  - Right ventricle: The cavity size was normal. Wall thickness was  normal. Systolic function was normal.  - Tricuspid valve: There was no regurgitation.  - Pulmonary arteries: Systolic pressure could not be accurately  estimated.  - Inferior vena cava: The vessel was normal in size.  - Pericardium, extracardiac: There was no pericardial effusion.   Impressions:   - LVEF mildly diffusely decreased most  probably secondary to atrial  fibrillation with RVR dusring acquisition.    EKG: Independently reviewed.  Vent.  rate 100 BPM PR interval * ms QRS duration 83 ms QT/QTc 334/431 ms P-R-T axes * 112 30 Atrial fibrillation Low voltage, precordial leads Probable right ventricular hypertrophy  Assessment/Plan Principal Problem:   Near syncope Multifactorial. (Vasovagal, volume depletion and generalized weakness) Observation/telemetry. Gentle/time-limited IV hydration. Check carotid Doppler. Check echocardiogram.  Active Problems:   COVID-19 virus infection No significant respiratory symptoms. However having significant constitutional symptoms. The patient is high risk for complications. Remdesivir per pharmacy consult requested. Follow-up CBC, CMP and inflammatory markers.    Hyponatremia Secondary to diuretics. Follow-up sodium level.    Hypertension Off Cardizem. Spironolactone held due to hyperkalemia. Monitor blood pressure electrolytes.    Depression Continue Escitalopram 10 mg p.o. daily.    Chronic diastolic CHF (congestive heart failure) (HCC) No signs of decompensation at this time.    OSA on CPAP Continue CPAP at bedtime. Also taking Requip 0.5 mg p.o. at bedtime    Paroxysmal atrial fibrillation (HCC) CHA?DS?-VASc Score Odalis 4. Xarelto held due to heme positive stools. The patient is no longer taking Cardizem. Continue cardiac telemetry.    Hyperkalemia Lokelma 10 g p.o. x1 dose. Hold the spironolactone.    Chronic back pain Continue gabapentin 600 mg in the morning. Continue gabapentin 900 mg in the evening. Continue oxycodone 5 mg p.o. every 6 hours as needed.    Gastroesophageal reflux disease Continue pantoprazole 40 mg IVP every 12 hours.    Macrocytosis  Check P10 and folic acid level.    Obesity, Class III, BMI 40-49.9 (morbid obesity) ([place]) Has decreased his BMI to the mid 40s. Continue lifestyle modifications. Follow-up with  PCP.   DVT prophylaxis: SCDs. Code Status:   Full code. Family Communication: Disposition Plan:   Patient is from:  Home.  Anticipated DC to:  Home.  Anticipated DC date:  09/27/2020.  Anticipated DC barriers: Clinical status.  Consults called: Admission status:  Observation/telemetry.  High due to active COVID-19 disease causing multiple symptoms, including generalized weakness, fatigue and lightheadedness with a near syncopal episode earlier today.  The patient will need to remain for syncope work-up and COVID-19 therapy with remdesivir.  Severity of Illness:  Reubin Milan MD [facility]  How to contact the [facility] Attending or Consulting provider [place] or covering provider during after hours [place], for this patient?   1. Check the care team in [facility] and look for a) attending/consulting [facility] provider listed and b) the [facility] team listed 2. Log into [email or web address] and use [place]'s universal password to access. If you do not have the password, please contact the hospital operator. 3. Locate the [facility] provider you are looking for under [facility] and page to a number that you can be directly reached. 4. If you still have difficulty reaching the provider, please page the [facility] (Director on Call) for the Hospitalists listed on amion for assistance.  09/25/2020, 8:55 PM   This document was prepared using Dragon voice recognition software and may contain some unintended transcription errors.

## 2020-09-25 NOTE — Progress Notes (Signed)
IS and flutter given and instructed to patient. Pt had good effort. Tol well

## 2020-09-25 NOTE — ED Notes (Signed)
Date and time results received: 09/25/20 6:14 PM   Test: Covid Critical Value: Positive  Name of Provider Notified: Dr. Roderic Palau  Orders Received? Or Actions Taken? See Orders

## 2020-09-26 ENCOUNTER — Other Ambulatory Visit (HOSPITAL_COMMUNITY): Payer: Self-pay | Admitting: Internal Medicine

## 2020-09-26 ENCOUNTER — Observation Stay (HOSPITAL_COMMUNITY): Payer: Medicare Other

## 2020-09-26 DIAGNOSIS — M170 Bilateral primary osteoarthritis of knee: Secondary | ICD-10-CM | POA: Diagnosis present

## 2020-09-26 DIAGNOSIS — Z7901 Long term (current) use of anticoagulants: Secondary | ICD-10-CM | POA: Diagnosis not present

## 2020-09-26 DIAGNOSIS — Z87891 Personal history of nicotine dependence: Secondary | ICD-10-CM | POA: Diagnosis not present

## 2020-09-26 DIAGNOSIS — G4700 Insomnia, unspecified: Secondary | ICD-10-CM | POA: Diagnosis present

## 2020-09-26 DIAGNOSIS — E875 Hyperkalemia: Secondary | ICD-10-CM | POA: Diagnosis present

## 2020-09-26 DIAGNOSIS — Z91030 Bee allergy status: Secondary | ICD-10-CM | POA: Diagnosis not present

## 2020-09-26 DIAGNOSIS — U071 COVID-19: Secondary | ICD-10-CM | POA: Diagnosis present

## 2020-09-26 DIAGNOSIS — I130 Hypertensive heart and chronic kidney disease with heart failure and stage 1 through stage 4 chronic kidney disease, or unspecified chronic kidney disease: Secondary | ICD-10-CM | POA: Diagnosis present

## 2020-09-26 DIAGNOSIS — E871 Hypo-osmolality and hyponatremia: Secondary | ICD-10-CM | POA: Diagnosis present

## 2020-09-26 DIAGNOSIS — Z6841 Body Mass Index (BMI) 40.0 and over, adult: Secondary | ICD-10-CM | POA: Diagnosis not present

## 2020-09-26 DIAGNOSIS — R55 Syncope and collapse: Secondary | ICD-10-CM

## 2020-09-26 DIAGNOSIS — Z79890 Hormone replacement therapy (postmenopausal): Secondary | ICD-10-CM | POA: Diagnosis not present

## 2020-09-26 DIAGNOSIS — Z9049 Acquired absence of other specified parts of digestive tract: Secondary | ICD-10-CM | POA: Diagnosis not present

## 2020-09-26 DIAGNOSIS — Z79899 Other long term (current) drug therapy: Secondary | ICD-10-CM | POA: Diagnosis not present

## 2020-09-26 DIAGNOSIS — N179 Acute kidney failure, unspecified: Secondary | ICD-10-CM | POA: Diagnosis present

## 2020-09-26 DIAGNOSIS — A0839 Other viral enteritis: Secondary | ICD-10-CM | POA: Diagnosis present

## 2020-09-26 DIAGNOSIS — G8929 Other chronic pain: Secondary | ICD-10-CM | POA: Diagnosis present

## 2020-09-26 DIAGNOSIS — N1831 Chronic kidney disease, stage 3a: Secondary | ICD-10-CM | POA: Diagnosis present

## 2020-09-26 DIAGNOSIS — Z96643 Presence of artificial hip joint, bilateral: Secondary | ICD-10-CM | POA: Diagnosis present

## 2020-09-26 DIAGNOSIS — K219 Gastro-esophageal reflux disease without esophagitis: Secondary | ICD-10-CM | POA: Diagnosis present

## 2020-09-26 DIAGNOSIS — M47812 Spondylosis without myelopathy or radiculopathy, cervical region: Secondary | ICD-10-CM | POA: Diagnosis present

## 2020-09-26 DIAGNOSIS — I5032 Chronic diastolic (congestive) heart failure: Secondary | ICD-10-CM | POA: Diagnosis present

## 2020-09-26 DIAGNOSIS — Z7401 Bed confinement status: Secondary | ICD-10-CM | POA: Diagnosis not present

## 2020-09-26 DIAGNOSIS — I700 Atherosclerosis of aorta: Secondary | ICD-10-CM | POA: Diagnosis present

## 2020-09-26 LAB — COMPREHENSIVE METABOLIC PANEL
ALT: 20 U/L (ref 0–44)
AST: 21 U/L (ref 15–41)
Albumin: 3.2 g/dL — ABNORMAL LOW (ref 3.5–5.0)
Alkaline Phosphatase: 62 U/L (ref 38–126)
Anion gap: 8 (ref 5–15)
BUN: 28 mg/dL — ABNORMAL HIGH (ref 8–23)
CO2: 23 mmol/L (ref 22–32)
Calcium: 8.7 mg/dL — ABNORMAL LOW (ref 8.9–10.3)
Chloride: 107 mmol/L (ref 98–111)
Creatinine, Ser: 1.38 mg/dL — ABNORMAL HIGH (ref 0.61–1.24)
GFR, Estimated: 54 mL/min — ABNORMAL LOW (ref >60)
Glucose, Bld: 101 mg/dL — ABNORMAL HIGH (ref 70–99)
Potassium: 4.8 mmol/L (ref 3.5–5.1)
Sodium: 138 mmol/L (ref 135–145)
Total Bilirubin: 0.3 mg/dL (ref 0.3–1.2)
Total Protein: 6.3 g/dL — ABNORMAL LOW (ref 6.5–8.1)

## 2020-09-26 LAB — HEMOGLOBIN AND HEMATOCRIT, BLOOD
HCT: 40.1 % (ref 39.0–52.0)
HCT: 40.7 % (ref 39.0–52.0)
Hemoglobin: 12.7 g/dL — ABNORMAL LOW (ref 13.0–17.0)
Hemoglobin: 13 g/dL (ref 13.0–17.0)

## 2020-09-26 LAB — ECHOCARDIOGRAM COMPLETE
Area-P 1/2: 2.88 cm2
Height: 68 in
S' Lateral: 3.3 cm
Weight: 4800 oz

## 2020-09-26 LAB — CBC WITH DIFFERENTIAL/PLATELET
Abs Immature Granulocytes: 0.01 10*3/uL (ref 0.00–0.07)
Basophils Absolute: 0 10*3/uL (ref 0.0–0.1)
Basophils Relative: 0 %
Eosinophils Absolute: 0.1 10*3/uL (ref 0.0–0.5)
Eosinophils Relative: 1 %
HCT: 43.6 % (ref 39.0–52.0)
Hemoglobin: 13.6 g/dL (ref 13.0–17.0)
Immature Granulocytes: 0 %
Lymphocytes Relative: 34 %
Lymphs Abs: 1.4 10*3/uL (ref 0.7–4.0)
MCH: 31.5 pg (ref 26.0–34.0)
MCHC: 31.2 g/dL (ref 30.0–36.0)
MCV: 100.9 fL — ABNORMAL HIGH (ref 80.0–100.0)
Monocytes Absolute: 0.5 10*3/uL (ref 0.1–1.0)
Monocytes Relative: 13 %
Neutro Abs: 2.1 10*3/uL (ref 1.7–7.7)
Neutrophils Relative %: 52 %
Platelets: 136 10*3/uL — ABNORMAL LOW (ref 150–400)
RBC: 4.32 MIL/uL (ref 4.22–5.81)
RDW: 13.6 % (ref 11.5–15.5)
WBC: 4.1 10*3/uL (ref 4.0–10.5)
nRBC: 0 % (ref 0.0–0.2)

## 2020-09-26 LAB — PROCALCITONIN: Procalcitonin: <0.1 ng/mL

## 2020-09-26 LAB — BASIC METABOLIC PANEL
Anion gap: 7 (ref 5–15)
BUN: 27 mg/dL — ABNORMAL HIGH (ref 8–23)
CO2: 24 mmol/L (ref 22–32)
Calcium: 8.7 mg/dL — ABNORMAL LOW (ref 8.9–10.3)
Chloride: 107 mmol/L (ref 98–111)
Creatinine, Ser: 1.4 mg/dL — ABNORMAL HIGH (ref 0.61–1.24)
GFR, Estimated: 53 mL/min — ABNORMAL LOW (ref >60)
Glucose, Bld: 102 mg/dL — ABNORMAL HIGH (ref 70–99)
Potassium: 4.8 mmol/L (ref 3.5–5.1)
Sodium: 138 mmol/L (ref 135–145)

## 2020-09-26 LAB — CBC
HCT: 43.2 % (ref 39.0–52.0)
Hemoglobin: 13.9 g/dL (ref 13.0–17.0)
MCH: 32.5 pg (ref 26.0–34.0)
MCHC: 32.2 g/dL (ref 30.0–36.0)
MCV: 100.9 fL — ABNORMAL HIGH (ref 80.0–100.0)
Platelets: 150 10*3/uL (ref 150–400)
RBC: 4.28 MIL/uL (ref 4.22–5.81)
RDW: 13.5 % (ref 11.5–15.5)
WBC: 4.2 10*3/uL (ref 4.0–10.5)
nRBC: 0 % (ref 0.0–0.2)

## 2020-09-26 LAB — C-REACTIVE PROTEIN: CRP: 2.7 mg/dL — ABNORMAL HIGH (ref <1.0)

## 2020-09-26 LAB — FERRITIN: Ferritin: 479 ng/mL — ABNORMAL HIGH (ref 24–336)

## 2020-09-26 LAB — FOLATE: Folate: 19.6 ng/mL (ref >5.9)

## 2020-09-26 LAB — D-DIMER, QUANTITATIVE: D-Dimer, Quant: 0.49 ug/mL-FEU (ref 0.00–0.50)

## 2020-09-26 LAB — CBG MONITORING, ED: Glucose-Capillary: 87 mg/dL (ref 70–99)

## 2020-09-26 LAB — VITAMIN B12: Vitamin B-12: 858 pg/mL (ref 180–914)

## 2020-09-26 MED ORDER — MIDODRINE HCL 5 MG PO TABS
10.0000 mg | ORAL_TABLET | Freq: Three times a day (TID) | ORAL | Status: DC
Start: 2020-09-26 — End: 2020-09-28
  Administered 2020-09-26 – 2020-09-28 (×8): 10 mg via ORAL
  Filled 2020-09-26 (×8): qty 2

## 2020-09-26 MED ORDER — LACTATED RINGERS IV SOLN: INTRAVENOUS | Status: AC

## 2020-09-26 MED ORDER — SODIUM CHLORIDE 0.9 % IV SOLN
100.0000 mg | Freq: Every day | INTRAVENOUS | Status: DC
  Administered 2020-09-27 – 2020-09-28 (×2): 100 mg via INTRAVENOUS
  Filled 2020-09-26 (×2): qty 20

## 2020-09-26 MED ORDER — SODIUM CHLORIDE 0.9 % IV BOLUS
1000.0000 mL | Freq: Once | INTRAVENOUS | Status: AC
  Administered 2020-09-26: 1000 mL via INTRAVENOUS

## 2020-09-26 MED ORDER — SODIUM CHLORIDE 0.9 % IV SOLN
100.0000 mg | INTRAVENOUS | Status: AC
  Administered 2020-09-26 (×2): 100 mg via INTRAVENOUS
  Filled 2020-09-26 (×2): qty 20

## 2020-09-26 NOTE — Progress Notes (Signed)
*  PRELIMINARY RESULTS* Echocardiogram 2D Echocardiogram with definity has been performed.  Leavy Cella 09/26/2020, 4:27 PM

## 2020-09-26 NOTE — Progress Notes (Signed)
PROGRESS NOTE    RAMZY TSOUKALAS  [id number] DOB: December 24, 1945 DOA: 09/25/2020 PCP: [facility]   Brief Narrative:  Per HPI: [name] is a 75 y.o. 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  The patient states that his vision turned black and he almost passed out.  Not feeling well for the past 3 to 4 days.  He denies fever, but has had chills, day and night sweats, decreased appetite, nausea without emesis, fatigue, malaise, generalized weakness, occasional dry cough, but denies dyspnea, wheezing, hemoptysis, chest pain, palpitations, PND, orthopnea or recent pitting edema of the lower extremities.  Denies abdominal pain, diarrhea, melena or hematochezia.  No dysuria, frequency or nocturia.  No polyuria, polydipsia, polyphagia or blurred vision.   Assessment & Plan:   Principal Problem:   Near syncope Active Problems:   Hyponatremia   Hypertension   Depression   Obesity, Class III, BMI 40-49.9 (morbid obesity) (HCC)   Chronic diastolic CHF (congestive heart failure) (HCC)   OSA on CPAP   Hyperkalemia   Chronic back pain   Gastroesophageal reflux disease   Macrocytosis   Paroxysmal atrial fibrillation (HCC)   COVID-19 virus infection   Near syncope Multifactorial. (Vasovagal, volume depletion and generalized weakness, COVID) Observation/telemetry.  IV hydration to continue Check carotid Doppler-no stenosis Check echocardiogram-pending Bedbound at home, orthostatics within normal limits    COVID-19 virus infection No significant  respiratory symptoms. However having significant constitutional symptoms. The patient is high risk for complications. Remdesivir per pharmacy consult requested. Follow-up CBC, CMP and inflammatory markers.  AKI versus CKD stage IIIa -Appears to be improving, continue monitoring on IV fluid    Hypertension, but now with hypotension Home midodrine resumed Off Cardizem. Spironolactone held due to hyperkalemia. Monitor blood pressure electrolytes.    Depression Continue Escitalopram 10 mg p.o. daily.    Chronic diastolic CHF (congestive heart failure) (HCC) No signs of decompensation at this time.    OSA on CPAP Continue CPAP at bedtime. Also taking Requip 0.5 mg p.o. at bedtime    Paroxysmal atrial fibrillation ([place]) CHADsVASc 4. Xarelto held due to heme positive stools. The patient is no longer taking Cardizem. Continue cardiac telemetry.    Chronic back pain Continue gabapentin 600 mg in the morning. Continue gabapentin 900 mg in the evening. Continue oxycodone 5 mg p.o. every 6 hours as needed.    Gastroesophageal reflux disease Continue pantoprazole 40 mg IVP every 12 hours.    Macrocytosis   123456 and folic acid level normal    Obesity, Class III, BMI 40-49.9 (morbid obesity) ([place]) Has decreased his BMI to the mid 40s. Continue lifestyle modifications. Follow-up with PCP.   DVT prophylaxis:SCDs Code Status: Full Family Communication: Tried calling Dalan Lamotte 1/26 with no response Disposition Plan: Continue current treatments Status is: Observation  The patient will require care spanning > 2 midnights and should be moved to inpatient because: IV treatments appropriate due to intensity of illness or inability to take PO and Inpatient level of care appropriate due to severity of illness  Dispo: The patient is from: Home  Anticipated d/c is to: Home              Anticipated d/c date is: 1 day              Patient currently is not medically  stable to d/c.   Difficult to place patient No   Nutritional Assessment:  The patient's BMI is: Body mass index is 45.61 kg/m.Marland Kitchen  Consultants:   None  Procedures:   See below  Echo 1/26 pending  Antimicrobials:  Anti-infectives (From admission, onward)   Start     Dose/Rate Route Frequency Ordered Stop   09/27/20 1000  remdesivir 100 mg in sodium chloride 0.9 % 100 mL IVPB        100 mg 200 mL/hr over 30 Minutes Intravenous Daily 09/26/20 0015 10/01/20 0959   09/26/20 0030  remdesivir 100 mg in sodium chloride 0.9 % 100 mL IVPB        100 mg 200 mL/hr over 30 Minutes Intravenous Every 1 hr x 2 09/26/20 0015 09/26/20 0242       Subjective: Patient seen and evaluated today with no new acute complaints or concerns. No acute concerns or events noted overnight.  He continues to have episodes of presyncope with any kind of movement and is noted to have diarrhea.  Objective: Vitals:   09/26/20 0600 09/26/20 0630 09/26/20 0930 09/26/20 1315  BP: 107/81 105/66 92/73 111/70  Pulse: 94 99 (!) 102 (!) 101  Resp: 18 17 20 18   Temp:      TempSrc:      SpO2: 98% 99% 99% 98%  Weight:      Height:        Intake/Output Summary (Last 24 hours) at 09/26/2020 1635 Last data filed at 09/25/2020 1735 Gross per 24 hour  Intake 500 ml  Output --  Net 500 ml   Filed Weights   09/25/20 1600  Weight: 136.1 kg    Examination:  General exam: Appears calm and comfortable  Respiratory system: Clear to auscultation. Respiratory effort normal. Cardiovascular system: S1 & S2 heard, RRR.  Gastrointestinal system: Abdomen is soft Central nervous system: Alert and awake Extremities: No edema Skin: No significant lesions noted Psychiatry: Flat affect.    Data Reviewed: I have personally reviewed following labs and imaging studies  CBC: Recent Labs  Lab 09/25/20 1635 09/25/20 2200 09/26/20 0542 09/26/20 0933  WBC 4.7  --  4.1  4.2  --   NEUTROABS 3.5  --  2.1  --   HGB 15.3  14.1 13.6  13.9 12.7*  HCT 49.8 44.6 43.6  43.2 40.1  MCV 103.8*  --  100.9*  100.9*  --   PLT 143*  --  136*  150  --    Basic Metabolic Panel: Recent Labs  Lab 09/25/20 1635 09/26/20 0542  NA 133* 138  138  K 5.7* 4.8  4.8  CL 104 107  107  CO2 19* 23  24  GLUCOSE 145* 101*  102*  BUN 29* 28*  27*  CREATININE 1.86* 1.38*  1.40*  CALCIUM 9.1 8.7*  8.7*   GFR: Estimated Creatinine Clearance: 62.5 mL/min (A) (by C-G formula based on SCr of 1.4 mg/dL (H)). Liver Function Tests: Recent Labs  Lab 09/25/20 1635 09/26/20 0542  AST 35 21  ALT 27 20  ALKPHOS 74 62  BILITOT 0.4 0.3  PROT 7.9 6.3*  ALBUMIN 3.7 3.2*   No results for input(s): LIPASE, AMYLASE in the last 168 hours. Recent  Labs  Lab 09/25/20 2200  AMMONIA 10   Coagulation Profile: Recent Labs  Lab 09/25/20 1635  INR 1.5*   Cardiac Enzymes: No results for input(s): CKTOTAL, CKMB, CKMBINDEX, TROPONINI in the last 168 hours. BNP (last 3 results) No results for input(s): PROBNP in the last 8760 hours. HbA1C: No results for input(s): HGBA1C in the last 72 hours. CBG: Recent Labs  Lab 09/26/20 0945  GLUCAP 87   Lipid Profile: No results for input(s): CHOL, HDL, LDLCALC, TRIG, CHOLHDL, LDLDIRECT in the last 72 hours. Thyroid Function Tests: No results for input(s): TSH, T4TOTAL, FREET4, T3FREE, THYROIDAB in the last 72 hours. Anemia Panel: Recent Labs    09/25/20 2041 09/26/20 0542  VITAMINB12  --  858  FOLATE  --  19.6  FERRITIN 554* 479*   Sepsis Labs: Recent Labs  Lab 09/25/20 2041  PROCALCITON <0.10    Recent Results (from the past 240 hour(s))  SARS Coronavirus 2 by RT PCR (hospital order, performed in [facility] hospital lab) Nasopharyngeal Nasopharyngeal Swab     Status: Abnormal   Collection Time: 09/25/20  4:45 PM   Specimen: Nasopharyngeal Swab  Result Value Ref Range Status   SARS Coronavirus 2 POSITIVE (A) NEGATIVE Final    Comment: RESULT CALLED TO, READ BACK BY  AND VERIFIED WITH: BRANDY OAKLEY @1813  09/25/20 BY JONES,T (NOTE) SARS-CoV-2 target nucleic acids are DETECTED  SARS-CoV-2 RNA is generally detectable in upper respiratory specimens  during the acute phase of infection.  Positive results are indicative  of the presence of the identified virus, but do not rule out bacterial infection or co-infection with other pathogens not detected by the test.  Clinical correlation with patient history and  other diagnostic information is necessary to determine patient infection status.  The expected result is negative.  Fact Sheet for Patients:   StrictlyIdeas.no   Fact Sheet for Healthcare Providers:   BankingDealers.co.za    This test is not yet approved or cleared by the [place] FDA and  has been authorized for detection and/or diagnosis of SARS-CoV-2 by FDA under an Emergency Use Authorization (EUA).  This EUA will remain in effect (meaning thi s test can be used) for the duration of  the COVID-19 declaration under Section 564(b)(1) of the Act, 21 U.S.C. section 360-bbb-3(b)(1), unless the authorization is terminated or revoked sooner.  Performed at [facility], [address] [place], [place] [address]          Radiology Studies: US Carotid Bilateral  Result Date: 09/26/2020 CLINICAL DATA:  Syncope Temporal giant cell arteritis CHF EXAM: BILATERAL CAROTID DUPLEX ULTRASOUND TECHNIQUE: Pearline Cables scale imaging, color Doppler and duplex ultrasound were performed of bilateral carotid and vertebral arteries in the neck. COMPARISON:  None. FINDINGS: Criteria: Quantification of carotid stenosis is based on velocity parameters that correlate the residual internal carotid diameter with NASCET-based stenosis levels, using the diameter of the distal internal carotid lumen as the denominator for stenosis measurement. The following velocity measurements were obtained: RIGHT ICA: 47/21 cm/sec CCA: [id number]  cm/sec SYSTOLIC ICA/CCA RATIO:  0.6 ECA: 84 cm/sec LEFT ICA: 42/13 cm/sec CCA: [id number] cm/sec SYSTOLIC ICA/CCA RATIO:  0.5 ECA: 69 cm/sec RIGHT CAROTID ARTERY: Mild heterogeneous plaque of the carotid bulb without flow-limiting stenosis. RIGHT VERTEBRAL ARTERY:  Antegrade flow. LEFT CAROTID ARTERY: Mild heterogeneous plaque of the distal common carotid artery and carotid bulb without flow limiting stenosis. LEFT VERTEBRAL ARTERY:  Antegrade flow. IMPRESSION: No hemodynamically significant stenosis of the internal carotid arteries. Electronically Signed  By: Sharen Heck  Mir M.D.   On: 09/26/2020 09:34   DG Chest Port 1 View  Result Date: 09/25/2020 CLINICAL DATA:  Weakness EXAM: PORTABLE CHEST 1 VIEW COMPARISON:  07/12/2019 FINDINGS: Right lung is clear. Chronic pleural changes on the left. Stable cardiomediastinal silhouette with aortic atherosclerosis. No pneumothorax IMPRESSION: No active disease. Chronic pleural changes on the left. Electronically Signed   By: Donavan Foil M.D.   On: 09/25/2020 16:53        Scheduled Meds: . escitalopram  10 mg Oral Daily  . ferrous sulfate  325 mg Oral TID WC  . gabapentin  300 mg Oral TID  . melatonin  6 mg Oral QHS  . midodrine  10 mg Oral TID WC  . pantoprazole (PROTONIX) IV  40 mg Intravenous Q12H  . rOPINIRole  0.25 mg Oral QHS  . sodium chloride flush  3 mL Intravenous Q12H  . thiamine  100 mg Oral Daily   Continuous Infusions: . [START ON 09/27/2020] remdesivir 100 mg in NS 100 mL       LOS: 0 days    Time spent: 35 minutes    Alexandrya Chim Darleen Crocker, DO [facility]  If 7PM-7AM, please contact night-coverage [email or web address] 09/26/2020, 4:35 PM

## 2020-09-27 LAB — COMPREHENSIVE METABOLIC PANEL
ALT: 20 U/L (ref 0–44)
AST: 22 U/L (ref 15–41)
Albumin: 2.9 g/dL — ABNORMAL LOW (ref 3.5–5.0)
Alkaline Phosphatase: 60 U/L (ref 38–126)
Anion gap: 5 (ref 5–15)
BUN: 24 mg/dL — ABNORMAL HIGH (ref 8–23)
CO2: 23 mmol/L (ref 22–32)
Calcium: 8.8 mg/dL — ABNORMAL LOW (ref 8.9–10.3)
Chloride: 108 mmol/L (ref 98–111)
Creatinine, Ser: 1.27 mg/dL — ABNORMAL HIGH (ref 0.61–1.24)
GFR, Estimated: 59 mL/min — ABNORMAL LOW (ref >60)
Glucose, Bld: 88 mg/dL (ref 70–99)
Potassium: 4.9 mmol/L (ref 3.5–5.1)
Sodium: 136 mmol/L (ref 135–145)
Total Bilirubin: 0.7 mg/dL (ref 0.3–1.2)
Total Protein: 6.1 g/dL — ABNORMAL LOW (ref 6.5–8.1)

## 2020-09-27 LAB — C-REACTIVE PROTEIN: CRP: 1.5 mg/dL — ABNORMAL HIGH (ref <1.0)

## 2020-09-27 LAB — CBC WITH DIFFERENTIAL/PLATELET
Abs Immature Granulocytes: 0.01 10*3/uL (ref 0.00–0.07)
Basophils Absolute: 0 10*3/uL (ref 0.0–0.1)
Basophils Relative: 0 %
Eosinophils Absolute: 0.1 10*3/uL (ref 0.0–0.5)
Eosinophils Relative: 2 %
HCT: 41 % (ref 39.0–52.0)
Hemoglobin: 12.8 g/dL — ABNORMAL LOW (ref 13.0–17.0)
Immature Granulocytes: 0 %
Lymphocytes Relative: 37 %
Lymphs Abs: 1.5 10*3/uL (ref 0.7–4.0)
MCH: 31.5 pg (ref 26.0–34.0)
MCHC: 31.2 g/dL (ref 30.0–36.0)
MCV: 101 fL — ABNORMAL HIGH (ref 80.0–100.0)
Monocytes Absolute: 0.6 10*3/uL (ref 0.1–1.0)
Monocytes Relative: 15 %
Neutro Abs: 1.8 10*3/uL (ref 1.7–7.7)
Neutrophils Relative %: 46 %
Platelets: 144 10*3/uL — ABNORMAL LOW (ref 150–400)
RBC: 4.06 MIL/uL — ABNORMAL LOW (ref 4.22–5.81)
RDW: 13.7 % (ref 11.5–15.5)
WBC: 4 10*3/uL (ref 4.0–10.5)
nRBC: 0 % (ref 0.0–0.2)

## 2020-09-27 LAB — GASTROINTESTINAL PANEL BY PCR, STOOL (REPLACES STOOL CULTURE)

## 2020-09-27 LAB — FERRITIN: Ferritin: 441 ng/mL — ABNORMAL HIGH (ref 24–336)

## 2020-09-27 LAB — HEMOGLOBIN AND HEMATOCRIT, BLOOD
HCT: 38.2 % — ABNORMAL LOW (ref 39.0–52.0)
Hemoglobin: 12.1 g/dL — ABNORMAL LOW (ref 13.0–17.0)

## 2020-09-27 LAB — GLUCOSE, CAPILLARY: Glucose-Capillary: 101 mg/dL — ABNORMAL HIGH (ref 70–99)

## 2020-09-27 LAB — D-DIMER, QUANTITATIVE: D-Dimer, Quant: 0.66 ug/mL-FEU — ABNORMAL HIGH (ref 0.00–0.50)

## 2020-09-27 MED ORDER — NYSTATIN 100000 UNIT/GM EX POWD
Freq: Two times a day (BID) | CUTANEOUS | Status: DC
  Filled 2020-09-27: qty 15

## 2020-09-27 MED ORDER — LACTATED RINGERS IV SOLN: INTRAVENOUS | Status: AC

## 2020-09-27 NOTE — Progress Notes (Signed)
Pt called out for assistance, states having cold chills and sweating. Upon exam, pt A&O, skin warm, color pale but unchanged from am assessment. Pt with beads of sweat on face, upper lip, forehead and neck/upper chest area. Rectal temp reads 98.5, other VSS (see flowsheet). Pt states, "I just feel awful." CBG done, blood sugar 101 mg/dl. Pt with no history of diabetes or hypoglycemia. MD Manuella Ghazi notified of pt's c/o and assessment.

## 2020-09-27 NOTE — Progress Notes (Signed)
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 The patient states that his vision turned black and he almost passed out. Not feeling well for the past 3 to 4 days.He denies fever, but has had chills, day and night sweats, decreased appetite, nausea without emesis, fatigue, malaise, generalized weakness, occasional dry cough, but denies dyspnea, wheezing, hemoptysis, chest pain, palpitations, PND, orthopnea or recent pitting edema of the lower extremities. Denies abdominal pain, diarrhea, melena or hematochezia. No dysuria, frequency or nocturia. No polyuria, polydipsia, polyphagia or blurred vision.  Assessment & Plan:   Principal Problem:   Near syncope Active Problems:   Hyponatremia   Hypertension   Depression   Obesity, Class III, BMI 40-49.9 (morbid obesity) (HCC)   Chronic diastolic CHF (congestive heart failure) (HCC)   OSA on CPAP   Hyperkalemia   Chronic back pain   Gastroesophageal reflux disease   Macrocytosis   Paroxysmal atrial fibrillation (HCC)   COVID-19 virus infection   Near syncope Multifactorial. (Vasovagal, volume depletion and generalized weakness, COVID) Observation/telemetry.  IV hydration to continue Check carotid Doppler-no stenosis Check echocardiogram-LVEF 50-55% Bedbound at home, orthostatics within normal limits  COVID-19 virus infection No significant  respiratory symptoms. However having significant constitutional symptoms. The patient is high risk for complications. Remdesivir per pharmacy consult requested. Follow-up CBC, CMP and inflammatory markers. Noted diarrhea which is ongoing with GI pathogen panel pending  AKI versus CKD stage IIIa -Appears to be improving, continue monitoring on IV fluid  Hypertension, but now with hypotension Home midodrine resumed Off Cardizem. Spironolactone held due to hyperkalemia. Monitor blood pressure electrolytes.  Depression Continue Escitalopram 10 mg p.o. daily.  Chronic diastolic CHF (congestive heart failure) (HCC) No signs of decompensation at this time.  OSA on CPAP Continue CPAP at bedtime. Also taking Requip 0.5 mg p.o. at bedtime  Paroxysmal atrial fibrillation ([place]) CHADsVASc 4. Xarelto held due to heme positive stools. The patient is no longer taking Cardizem. Continue cardiac telemetry.  Chronic back pain Continue gabapentin 600 mg in the morning. Continue gabapentin 900 mg in the evening. Continue oxycodone 5 mg p.o. every 6 hours as needed.  Gastroesophageal reflux disease Continue pantoprazole 40 mg IVP every 12 hours.  Macrocytosis   K81 and folic acid level normal  Obesity, Class III, BMI 40-49.9 (morbid obesity) ([place]) Has decreased his BMI to the mid 40s. Continue lifestyle modifications. Follow-up with PCP.   DVT prophylaxis:SCDs Code Status: Full Family Communication: Called Constance Hackenberg 1/27  Disposition Plan: Continue current treatments Status is: Inpatient  Remains inpatient appropriate because:IV treatments appropriate due to intensity of illness or inability to take PO   Dispo: The patient is from: Home              Anticipated d/c is to: Home              Anticipated d/c date is: 1 day              Patient currently is not medically stable to d/c.  Difficult to place patient No  Nutritional  Assessment:  The patient's BMI is: Body mass index is 45.61 kg/m.Marland Kitchen  Consultants:   None  Procedures:   See below  Echo 1/26   Antimicrobials:  Anti-infectives (From admission, onward)   Start     Dose/Rate Route Frequency Ordered Stop   09/27/20 1000  remdesivir 100 mg in sodium chloride 0.9 % 100 mL IVPB        100 mg 200 mL/hr over 30 Minutes Intravenous Daily 09/26/20 0015 10/01/20 0959   09/26/20 0030  remdesivir 100 mg in sodium chloride 0.9 % 100 mL IVPB        100 mg 200 mL/hr over 30 Minutes Intravenous Every 1 hr x 2 09/26/20 0015 09/26/20 0242       Subjective: Patient seen and evaluated today with ongoing dizziness and near syncope with any form of mild exertion.  He continues to have some diarrhea as well.  Objective: Vitals:   09/26/20 2207 09/27/20 0253 09/27/20 0550 09/27/20 0800  BP: 120/77 108/74 115/79 103/80  Pulse: 100 95 89 (!) 108  Resp: 20 18 18 16   Temp: 98.2 F (36.8 C) 97.6 F (36.4 C) (!) 97.5 F (36.4 C)   TempSrc: Oral Oral Oral   SpO2: 97% 98% 97% 97%  Weight:      Height:        Intake/Output Summary (Last 24 hours) at 09/27/2020 1231 Last data filed at 09/27/2020 0900 Gross per 24 hour  Intake 240 ml  Output --  Net 240 ml   Filed Weights   09/25/20 1600  Weight: 136.1 kg    Examination:  General exam: Appears calm and comfortable  Respiratory system: Clear to auscultation. Respiratory effort normal. Cardiovascular system: S1 & S2 heard, RRR.  Gastrointestinal system: Abdomen is soft Central nervous system: Alert and awake Extremities: No edema Skin: No significant lesions noted Psychiatry: Flat affect.    Data Reviewed: I have personally reviewed following labs and imaging studies  CBC: Recent Labs  Lab 09/25/20 1635 09/25/20 2200 09/26/20 0542 09/26/20 0933 09/26/20 2159 09/27/20 0641  WBC 4.7  --  4.1  4.2  --   --  4.0  NEUTROABS 3.5  --  2.1  --   --  1.8  HGB 15.3 14.1 13.6  13.9 12.7* 13.0  12.8*  HCT 49.8 44.6 43.6  43.2 40.1 40.7 41.0  MCV 103.8*  --  100.9*  100.9*  --   --  101.0*  PLT 143*  --  136*  150  --   --  123456*   Basic Metabolic Panel: Recent Labs  Lab 09/25/20 1635 09/26/20 0542 09/27/20 0641  NA 133* 138  138 136  K 5.7* 4.8  4.8 4.9  CL 104 107  107 108  CO2 19* 23  24 23   GLUCOSE 145* 101*  102* 88  BUN 29* 28*  27* 24*  CREATININE 1.86* 1.38*  1.40* 1.27*  CALCIUM 9.1 8.7*  8.7* 8.8*   GFR: Estimated Creatinine Clearance: 68.9 mL/min (A) (by C-G formula based on SCr of 1.27 mg/dL (H)). Liver Function Tests: Recent Labs  Lab 09/25/20 1635 09/26/20 0542 09/27/20 0641  AST 35 21 22  ALT 27 20 20   ALKPHOS 74 62 60  BILITOT 0.4 0.3 0.7  PROT 7.9 6.3* 6.1*  ALBUMIN 3.7 3.2* 2.9*   No results for input(s): LIPASE, AMYLASE in the last 168 hours. Recent Labs  Lab 09/25/20 2200  AMMONIA 10  Coagulation Profile: Recent Labs  Lab 09/25/20 1635  INR 1.5*   Cardiac Enzymes: No results for input(s): CKTOTAL, CKMB, CKMBINDEX, TROPONINI in the last 168 hours. BNP (last 3 results) No results for input(s): PROBNP in the last 8760 hours. HbA1C: No results for input(s): HGBA1C in the last 72 hours. CBG: Recent Labs  Lab 09/26/20 0945  GLUCAP 87   Lipid Profile: No results for input(s): CHOL, HDL, LDLCALC, TRIG, CHOLHDL, LDLDIRECT in the last 72 hours. Thyroid Function Tests: No results for input(s): TSH, T4TOTAL, FREET4, T3FREE, THYROIDAB in the last 72 hours. Anemia Panel: Recent Labs    09/26/20 0542 09/27/20 0641  VITAMINB12 858  --   FOLATE 19.6  --   FERRITIN 479* 441*   Sepsis Labs: Recent Labs  Lab 09/25/20 2041  PROCALCITON <0.10    Recent Results (from the past 240 hour(s))  SARS Coronavirus 2 by RT PCR (hospital order, performed in [facility] hospital lab) Nasopharyngeal Nasopharyngeal Swab     Status: Abnormal   Collection Time: 09/25/20  4:45 PM   Specimen: Nasopharyngeal Swab  Result Value Ref  Range Status   SARS Coronavirus 2 POSITIVE (A) NEGATIVE Final    Comment: RESULT CALLED TO, READ BACK BY AND VERIFIED WITH: BRANDY OAKLEY @1813  09/25/20 BY JONES,T (NOTE) SARS-CoV-2 target nucleic acids are DETECTED  SARS-CoV-2 RNA is generally detectable in upper respiratory specimens  during the acute phase of infection.  Positive results are indicative  of the presence of the identified virus, but do not rule out bacterial infection or co-infection with other pathogens not detected by the test.  Clinical correlation with patient history and  other diagnostic information is necessary to determine patient infection status.  The expected result is negative.  Fact Sheet for Patients:   StrictlyIdeas.no   Fact Sheet for Healthcare Providers:   BankingDealers.co.za    This test is not yet approved or cleared by the [place] FDA and  has been authorized for detection and/or diagnosis of SARS-CoV-2 by FDA under an Emergency Use Authorization (EUA).  This EUA will remain in effect (meaning thi s test can be used) for the duration of  the COVID-19 declaration under Section 564(b)(1) of the Act, 21 U.S.C. section 360-bbb-3(b)(1), unless the authorization is terminated or revoked sooner.  Performed at [facility], [address] [place], [place] [address]          Radiology Studies: US Carotid Bilateral  Result Date: 09/26/2020 CLINICAL DATA:  Syncope Temporal giant cell arteritis CHF EXAM: BILATERAL CAROTID DUPLEX ULTRASOUND TECHNIQUE: Pearline Cables scale imaging, color Doppler and duplex ultrasound were performed of bilateral carotid and vertebral arteries in the neck. COMPARISON:  None. FINDINGS: Criteria: Quantification of carotid stenosis is based on velocity parameters that correlate the residual internal carotid diameter with NASCET-based stenosis levels, using the diameter of the distal internal carotid lumen as the denominator for  stenosis measurement. The following velocity measurements were obtained: RIGHT ICA: 47/21 cm/sec CCA: 09/98 cm/sec SYSTOLIC ICA/CCA RATIO:  0.6 ECA: 84 cm/sec LEFT ICA: 42/13 cm/sec CCA: 33/82 cm/sec SYSTOLIC ICA/CCA RATIO:  0.5 ECA: 69 cm/sec RIGHT CAROTID ARTERY: Mild heterogeneous plaque of the carotid bulb without flow-limiting stenosis. RIGHT VERTEBRAL ARTERY:  Antegrade flow. LEFT CAROTID ARTERY: Mild heterogeneous plaque of the distal common carotid artery and carotid bulb without flow limiting stenosis. LEFT VERTEBRAL ARTERY:  Antegrade flow. IMPRESSION: No hemodynamically significant stenosis of the internal carotid arteries. Electronically Signed   By: Miachel Roux M.D.   On: 09/26/2020  09:34   DG Chest Port 1 View  Result Date: 09/25/2020 CLINICAL DATA:  Weakness EXAM: PORTABLE CHEST 1 VIEW COMPARISON:  07/12/2019 FINDINGS: Right lung is clear. Chronic pleural changes on the left. Stable cardiomediastinal silhouette with aortic atherosclerosis. No pneumothorax IMPRESSION: No active disease. Chronic pleural changes on the left. Electronically Signed   By: Donavan Foil M.D.   On: 09/25/2020 16:53   ECHOCARDIOGRAM COMPLETE  Result Date: 09/26/2020    ECHOCARDIOGRAM REPORT   Patient Name:   CHANSTON ROYAL Date of Exam: 09/26/2020 Medical Rec #:  [id number]     Height:       68.0 in Accession #:    [id number]    Weight:       300.0 lb Date of Birth:  12-23-1945     BSA:          2.428 m Patient Age:    20 years      BP:           111/70 mmHg Patient Gender: M             HR:           101 bpm. Exam Location:  [facility] Procedure: Intracardiac Opacification Agent Indications:    Syncope 780.2 / R55  History:        Patient has prior history of Echocardiogram examinations. CHF,                 Arrythmias:Atrial Fibrillation; Risk Factors:Hypertension and                 Former Smoker. Covid 19GERD.  Sonographer:    Leavy Cella RDCS (AE) Referring Phys: [id number] [place]  Sonographer Comments:  Patient is morbidly obese. Image acquisition challenging due to patient body habitus. IMPRESSIONS  1. Limited are limited, even with Definity contrast.  2. Left ventricular ejection fraction, by estimation, is approximately 50 to 55%. The left ventricle has low normal function. Left ventricular endocardial border not optimally defined to evaluate regional wall motion. There is mild left ventricular hypertrophy. Left ventricular diastolic parameters are indeterminate.  3. Right ventricular systolic function is low normal. The right ventricular size is normal. Tricuspid regurgitation signal is inadequate for assessing PA pressure.  4. The mitral valve is grossly normal. Trivial mitral valve regurgitation.  5. The aortic valve is tricuspid. Aortic valve regurgitation is not visualized. Mild aortic valve sclerosis is present, with no evidence of aortic valve stenosis.  6. The inferior vena cava is normal in size with greater than 50% respiratory variability, suggesting right atrial pressure of 3 mmHg. FINDINGS  Left Ventricle: Left ventricular ejection fraction, by estimation, is 50 to 55%. The left ventricle has low normal function. Left ventricular endocardial border not optimally defined to evaluate regional wall motion. Definity contrast agent was given IV  to delineate the left ventricular endocardial borders. The left ventricular internal cavity size was normal in size. There is mild left ventricular hypertrophy. Left ventricular diastolic parameters are indeterminate. Right Ventricle: The right ventricular size is normal. No increase in right ventricular wall thickness. Right ventricular systolic function is low normal. Tricuspid regurgitation signal is inadequate for assessing PA pressure. Left Atrium: Left atrial size was normal in size. Right Atrium: Right atrial size was normal in size. Pericardium: There is no evidence of pericardial effusion. Mitral Valve: The mitral valve is grossly normal. Trivial mitral  valve regurgitation. Tricuspid Valve: The tricuspid valve is grossly normal. Tricuspid valve regurgitation is trivial.  Aortic Valve: The aortic valve is tricuspid. There is mild aortic valve annular calcification. Aortic valve regurgitation is not visualized. Mild aortic valve sclerosis is present, with no evidence of aortic valve stenosis. Pulmonic Valve: The pulmonic valve was not well visualized. Pulmonic valve regurgitation is not visualized. Aorta: The aortic root is normal in size and structure. Venous: The inferior vena cava is normal in size with greater than 50% respiratory variability, suggesting right atrial pressure of 3 mmHg. IAS/Shunts: No atrial level shunt detected by color flow Doppler.  LEFT VENTRICLE PLAX 2D LVIDd:         4.20 cm LVIDs:         3.30 cm LV PW:         1.50 cm LV IVS:        1.20 cm  RIGHT VENTRICLE RV S prime:     11.60 cm/s TAPSE (M-mode): 1.8 cm LEFT ATRIUM           Index LA Vol (A2C): 20.7 ml 8.53 ml/m LA Vol (A4C): 54.2 ml 22.32 ml/m   AORTA Ao Root diam: 3.70 cm MITRAL VALVE MV Area (PHT): 2.88 cm MV Decel Time: 263 msec MV E velocity: 60.20 cm/s Rozann Lesches MD Electronically signed by Rozann Lesches MD Signature Date/Time: 09/26/2020/4:36:27 PM    Final         Scheduled Meds: . escitalopram  10 mg Oral Daily  . ferrous sulfate  325 mg Oral TID WC  . gabapentin  300 mg Oral TID  . melatonin  6 mg Oral QHS  . midodrine  10 mg Oral TID WC  . pantoprazole (PROTONIX) IV  40 mg Intravenous Q12H  . rOPINIRole  0.25 mg Oral QHS  . sodium chloride flush  3 mL Intravenous Q12H  . thiamine  100 mg Oral Daily   Continuous Infusions: . remdesivir 100 mg in NS 100 mL 100 mg (09/27/20 1123)     LOS: 1 day    Time spent: 35 minutes    Herson Prichard Darleen Crocker, DO [facility]  If 7PM-7AM, please contact night-coverage [email or web address] 09/27/2020, 12:31 PM

## 2020-09-28 LAB — CBC WITH DIFFERENTIAL/PLATELET
Abs Immature Granulocytes: 0.01 10*3/uL (ref 0.00–0.07)
Basophils Absolute: 0 10*3/uL (ref 0.0–0.1)
Basophils Relative: 0 %
Eosinophils Absolute: 0.1 10*3/uL (ref 0.0–0.5)
Eosinophils Relative: 2 %
HCT: 40.7 % (ref 39.0–52.0)
Hemoglobin: 12.7 g/dL — ABNORMAL LOW (ref 13.0–17.0)
Immature Granulocytes: 0 %
Lymphocytes Relative: 31 %
Lymphs Abs: 1.5 10*3/uL (ref 0.7–4.0)
MCH: 31.7 pg (ref 26.0–34.0)
MCHC: 31.2 g/dL (ref 30.0–36.0)
MCV: 101.5 fL — ABNORMAL HIGH (ref 80.0–100.0)
Monocytes Absolute: 0.7 10*3/uL (ref 0.1–1.0)
Monocytes Relative: 15 %
Neutro Abs: 2.5 10*3/uL (ref 1.7–7.7)
Neutrophils Relative %: 52 %
Platelets: 171 10*3/uL (ref 150–400)
RBC: 4.01 MIL/uL — ABNORMAL LOW (ref 4.22–5.81)
RDW: 13.5 % (ref 11.5–15.5)
WBC: 4.8 10*3/uL (ref 4.0–10.5)
nRBC: 0 % (ref 0.0–0.2)

## 2020-09-28 LAB — COMPREHENSIVE METABOLIC PANEL
ALT: 22 U/L (ref 0–44)
AST: 22 U/L (ref 15–41)
Albumin: 2.9 g/dL — ABNORMAL LOW (ref 3.5–5.0)
Alkaline Phosphatase: 60 U/L (ref 38–126)
Anion gap: 4 — ABNORMAL LOW (ref 5–15)
BUN: 24 mg/dL — ABNORMAL HIGH (ref 8–23)
CO2: 25 mmol/L (ref 22–32)
Calcium: 8.9 mg/dL (ref 8.9–10.3)
Chloride: 107 mmol/L (ref 98–111)
Creatinine, Ser: 1.3 mg/dL — ABNORMAL HIGH (ref 0.61–1.24)
GFR, Estimated: 58 mL/min — ABNORMAL LOW (ref >60)
Glucose, Bld: 87 mg/dL (ref 70–99)
Potassium: 5.2 mmol/L — ABNORMAL HIGH (ref 3.5–5.1)
Sodium: 136 mmol/L (ref 135–145)
Total Bilirubin: 0.5 mg/dL (ref 0.3–1.2)
Total Protein: 6 g/dL — ABNORMAL LOW (ref 6.5–8.1)

## 2020-09-28 LAB — C-REACTIVE PROTEIN: CRP: 1.2 mg/dL — ABNORMAL HIGH (ref <1.0)

## 2020-09-28 LAB — D-DIMER, QUANTITATIVE: D-Dimer, Quant: 0.88 ug/mL-FEU — ABNORMAL HIGH (ref 0.00–0.50)

## 2020-09-28 LAB — HEMOGLOBIN AND HEMATOCRIT, BLOOD
HCT: 40.4 % (ref 39.0–52.0)
Hemoglobin: 12.8 g/dL — ABNORMAL LOW (ref 13.0–17.0)

## 2020-09-28 LAB — GLUCOSE, CAPILLARY: Glucose-Capillary: 88 mg/dL (ref 70–99)

## 2020-09-28 LAB — FERRITIN: Ferritin: 428 ng/mL — ABNORMAL HIGH (ref 24–336)

## 2020-09-28 LAB — POTASSIUM
Potassium: 5.4 mmol/L — ABNORMAL HIGH (ref 3.5–5.1)
Potassium: 4.2 mmol/L (ref 3.5–5.1)

## 2020-09-28 MED ORDER — ONDANSETRON HCL 4 MG PO TABS: 4.0000 mg | ORAL_TABLET | Freq: Four times a day (QID) | ORAL | 0 refills | Status: DC | PRN

## 2020-09-28 MED ORDER — MIDODRINE HCL 10 MG PO TABS: 10.0000 mg | ORAL_TABLET | Freq: Three times a day (TID) | ORAL | 2 refills | Status: AC

## 2020-09-28 MED ORDER — SODIUM ZIRCONIUM CYCLOSILICATE 10 G PO PACK
10.0000 g | PACK | Freq: Once | ORAL | Status: AC
  Administered 2020-09-28: 10 g via ORAL
  Filled 2020-09-28: qty 1

## 2020-09-28 NOTE — Plan of Care (Signed)
Problem: Education: Goal: Knowledge of risk factors and measures for prevention of condition will improve 09/28/2020 1609 by Adam Phenix, RN Outcome: Adequate for Discharge 09/28/2020 1123 by Adam Phenix, RN Outcome: Progressing   Problem: Coping: Goal: Psychosocial and spiritual needs will be supported 09/28/2020 1609 by Adam Phenix, RN Outcome: Adequate for Discharge 09/28/2020 1123 by Adam Phenix, RN Outcome: Progressing   Problem: Respiratory: Goal: Will maintain a patent airway 09/28/2020 1609 by Adam Phenix, RN Outcome: Adequate for Discharge 09/28/2020 1123 by Adam Phenix, RN Outcome: Progressing Goal: Complications related to the disease process, condition or treatment will be avoided or minimized 09/28/2020 1609 by Adam Phenix, RN Outcome: Adequate for Discharge 09/28/2020 1123 by Adam Phenix, RN Outcome: Progressing   Problem: Education: Goal: Knowledge of General Education information will improve Description: Including pain rating scale, medication(s)/side effects and non-pharmacologic comfort measures 09/28/2020 1609 by Adam Phenix, RN Outcome: Adequate for Discharge 09/28/2020 1123 by Adam Phenix, RN Outcome: Progressing   Problem: Health Behavior/Discharge Planning: Goal: Ability to manage health-related needs will improve 09/28/2020 1609 by Adam Phenix, RN Outcome: Adequate for Discharge 09/28/2020 1123 by Adam Phenix, RN Outcome: Progressing   Problem: Clinical Measurements: Goal: Ability to maintain clinical measurements within normal limits will improve 09/28/2020 1609 by Adam Phenix, RN Outcome: Adequate for Discharge 09/28/2020 1123 by Adam Phenix, RN Outcome: Progressing Goal: Will remain free from infection 09/28/2020 1609 by Adam Phenix, RN Outcome: Adequate for Discharge 09/28/2020 1123 by Adam Phenix, RN Outcome:  Progressing Goal: Diagnostic test results will improve 09/28/2020 1609 by Adam Phenix, RN Outcome: Adequate for Discharge 09/28/2020 1123 by Adam Phenix, RN Outcome: Progressing Goal: Respiratory complications will improve 09/28/2020 1609 by Adam Phenix, RN Outcome: Adequate for Discharge 09/28/2020 1123 by Adam Phenix, RN Outcome: Progressing Goal: Cardiovascular complication will be avoided 09/28/2020 1609 by Adam Phenix, RN Outcome: Adequate for Discharge 09/28/2020 1123 by Adam Phenix, RN Outcome: Progressing   Problem: Activity: Goal: Risk for activity intolerance will decrease 09/28/2020 1609 by Adam Phenix, RN Outcome: Adequate for Discharge 09/28/2020 1123 by Adam Phenix, RN Outcome: Progressing   Problem: Nutrition: Goal: Adequate nutrition will be maintained 09/28/2020 1609 by Adam Phenix, RN Outcome: Adequate for Discharge 09/28/2020 1123 by Adam Phenix, RN Outcome: Progressing   Problem: Coping: Goal: Level of anxiety will decrease 09/28/2020 1609 by Adam Phenix, RN Outcome: Adequate for Discharge 09/28/2020 1123 by Adam Phenix, RN Outcome: Progressing   Problem: Elimination: Goal: Will not experience complications related to bowel motility 09/28/2020 1609 by Adam Phenix, RN Outcome: Adequate for Discharge 09/28/2020 1123 by Adam Phenix, RN Outcome: Progressing Goal: Will not experience complications related to urinary retention 09/28/2020 1609 by Adam Phenix, RN Outcome: Adequate for Discharge 09/28/2020 1123 by Adam Phenix, RN Outcome: Progressing   Problem: Pain Managment: Goal: General experience of comfort will improve 09/28/2020 1609 by Adam Phenix, RN Outcome: Adequate for Discharge 09/28/2020 1123 by Adam Phenix, RN Outcome: Progressing   Problem: Safety: Goal: Ability to remain free from injury  will improve 09/28/2020 1609 by Adam Phenix, RN Outcome: Adequate for Discharge 09/28/2020 1123 by Adam Phenix, RN Outcome: Progressing   Problem: Skin Integrity: Goal: Risk for impaired skin integrity will decrease 09/28/2020 1609 by Adam Phenix, RN Outcome: Adequate for Discharge 09/28/2020 1123 by Adam Phenix, RN Outcome:  Progressing

## 2020-09-28 NOTE — TOC Transition Note (Signed)
Transition of Care [facility]) - CM/SW Discharge Note   Patient Details  Name: [name] MRN: [id number] Date of Birth: Aug 17, 1946  Transition of Care [facility]) CM/SW Contact:  Allie Gerhold, Chauncey Reading, RN Phone Number: 09/28/2020, 4:21 PM   Readmission Risk Interventions Readmission Risk Prevention Plan 09/28/2020 09/11/2018  Transportation Screening Complete Complete  PCP or Specialist Appt within 3-5 Days Complete -  HRI or Home Care Consult Complete -  Social Work Consult for [place] Planning/Counseling Complete -  Palliative Care Screening Not Applicable -  Medication Review Press photographer) Complete Complete  HRI or Home Care Consult - Complete  Some recent data might be hidden

## 2020-09-28 NOTE — TOC Transition Note (Signed)
Transition of Care [facility]) - CM/SW Discharge Note   Patient Details  Name: [name] MRN: [id number] Date of Birth: 04/01/1946  Transition of Care [facility]) CM/SW Contact:  [place] Mccuen, Chauncey Reading, RN Phone Number: 09/28/2020, 4:19 PM   Clinical Narrative:   Patient discharging home today. Patient active with [facility] for PT. Left VM with [facility] to add RN to services and pull order from Epic.     Final next level of care: [place] Barriers to Discharge: No Barriers Identified   Patient Goals and CMS Choice            Date HH Agency Contacted: 09/28/20 Time [place]: 1618 Representative spoke with at [place]: Left VM with Penni Homans of [facility]  Social Determinants of Health (SDOH) Interventions     Readmission Risk Interventions Readmission Risk Prevention Plan 09/11/2018  Transportation Screening Complete  Medication Review Press photographer) Complete  HRI or Home Care Consult Complete  Some recent data might be hidden

## 2020-09-28 NOTE — Discharge Summary (Signed)
Physician Discharge Summary  [name] WUJ:[id number] DOB: 07-Sep-1945 DOA: 09/25/2020  PCP: [facility]  Admit date: 09/25/2020  Discharge date: 09/28/2020  Admitted From:Home  Disposition:  Home  Recommendations for Outpatient Follow-up:  1. Follow up with PCP in 1-2 weeks 2. Please obtain BMP/CBC in one week 3. Follow up with GI outpatient to consider colonoscopy since hemoccult positive 4. Ok to resume Xarelto 5. 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 The patient states that his vision turned black and he almost passed out. Not feeling well for the past 3 to 4 days.He denies fever, but has had chills, day and night sweats, decreased appetite, nausea without emesis, fatigue, malaise, generalized weakness, occasional dry cough, but denies dyspnea, wheezing, hemoptysis, chest pain, palpitations, PND, orthopnea or recent pitting edema of the lower extremities. Denies abdominal pain, diarrhea, melena or hematochezia. No dysuria, frequency or nocturia. No polyuria, polydipsia, polyphagia or blurred vision.  -Patient was evaluated for near syncopal episode that appears to be related to COVID-19 virus infection. He was also noted to have AKI on admission, but doing well appears to simply have CKD stage IIIa. He was  also noted to have a brief episode of diarrhea and GI panel was obtained which was negative. It appears that he had some Covid gastroenteritis as he complained of some nausea and ear discharge as well. He did not have any vomiting noted and was able to tolerate his diet and therefore, is stable for discharge today. He will be given Zofran as needed for any further nausea or vomiting and per request, will be set up with home health RN and PT as he is essentially bedbound and his caretakers come to his home to assist him. His carotid ultrasound and 2D echocardiogram did not demonstrate any significant findings and telemetry did not demonstrate any significant findings either. He was noted to have heme-positive stools during this admission with stable hemoglobin levels and no overt bleeding noted. His Xarelto will be resumed at home and he will require GI follow-up outpatient to follow-up for this. He is stable for discharge.  Discharge Diagnoses:  Principal Problem:   Near syncope Active Problems:   Hyponatremia   Hypertension   Depression   Obesity, Class III, BMI 40-49.9 (morbid obesity) (HCC)   Chronic diastolic CHF (congestive heart failure) (HCC)   OSA on CPAP   Hyperkalemia   Chronic back pain   Gastroesophageal reflux disease   Macrocytosis   Paroxysmal atrial fibrillation ([place])   COVID-19 virus infection  Principal discharge diagnosis: Near syncope in the setting of COVID-19 infection with poor oral intake.  Discharge Instructions  Discharge Instructions    Diet - low sodium heart healthy   Complete by: As directed    Increase activity slowly   Complete by: As directed      Allergies as of 09/28/2020      Reactions   Bee Venom Swelling   Nickel Rash  Medication List    STOP taking these medications   apixaban 5 MG Tabs tablet Commonly known as: ELIQUIS   diltiazem 120 MG 24 hr capsule Commonly known as: CARDIZEM CD   doxycycline 100 MG capsule Commonly known as:  VIBRAMYCIN   hydrOXYzine 25 MG tablet Commonly known as: ATARAX/VISTARIL   senna-docusate 8.6-50 MG tablet Commonly known as: Senokot-S   valACYclovir 1000 MG tablet Commonly known as: VALTREX   Xarelto 20 MG Tabs tablet Generic drug: rivaroxaban     TAKE these medications   acetaminophen 325 MG tablet Commonly known as: TYLENOL Take 2 tablets (650 mg total) by mouth every 6 (six) hours as needed for mild pain, fever or headache.   albuterol 108 (90 Base) MCG/ACT inhaler Commonly known as: VENTOLIN HFA Inhale 1-2 puffs into the lungs every 6 (six) hours as needed for wheezing or shortness of breath.   calcium carbonate 500 MG chewable tablet Commonly known as: TUMS - dosed in mg elemental calcium Chew 1,000 mg by mouth 3 (three) times daily as needed for indigestion.   carboxymethylcellulose 0.5 % Soln Commonly known as: Refresh Tears Place 1 drop into both eyes daily as needed (dry eye/irritation).   CENTRUM ADULTS PO Take 1 tablet by mouth every evening.   cycloSPORINE 0.05 % ophthalmic emulsion Commonly known as: RESTASIS Place 1 drop into both eyes 2 (two) times daily.   diphenhydrAMINE 25 MG tablet Commonly known as: BENADRYL Take 50 mg by mouth every 6 (six) hours as needed.   Ensure Max Protein Liqd Take 330 mLs (11 oz total) by mouth 2 (two) times daily.   escitalopram 10 MG tablet Commonly known as: LEXAPRO Take 1 tablet (10 mg total) by mouth daily.   ferrous sulfate 325 (65 FE) MG tablet Take 1 tablet (325 mg total) by mouth 3 (three) times daily with meals.   gabapentin 300 MG capsule Commonly known as: NEURONTIN Take 1 capsule (300 mg total) by mouth 3 (three) times daily. What changed:   when to take this  additional instructions   lactulose 10 GM/15ML solution Commonly known as: CHRONULAC Take 45 mLs (30 g total) by mouth 2 (two) times daily.   lidocaine 5 % Commonly known as: LIDODERM Place 1 patch onto the skin daily. Remove &  Discard patch within 12 hours or as directed by MD   melatonin 3 MG Tabs tablet Take 2 tablets (6 mg total) by mouth at bedtime.   midodrine 10 MG tablet Commonly known as: PROAMATINE Take 1 tablet (10 mg total) by mouth 3 (three) times daily with meals.   ondansetron 4 MG tablet Commonly known as: ZOFRAN Take 1 tablet (4 mg total) by mouth every 6 (six) hours as needed for nausea.   oxyCODONE 5 MG immediate release tablet Commonly known as: Oxy IR/ROXICODONE Take 1 tablet (5 mg total) by mouth every 6 (six) hours as needed for moderate pain, severe pain or breakthrough pain.   pantoprazole 40 MG tablet Commonly known as: PROTONIX Take 1 tablet (40 mg total) by mouth daily.   polyethylene glycol 17 g packet Commonly known as: MIRALAX / GLYCOLAX Take 17 g by mouth daily as needed for mild constipation.   rOPINIRole 0.25 MG tablet Commonly known as: REQUIP Take 1 tablet (0.25 mg total) by mouth at bedtime.   spironolactone 50 MG tablet Commonly known as: ALDACTONE Take 100mg  every morning and 50mg  every evening What changed:   how much to take  how to take this  when to take this  additional instructions  Another medication with the same name was removed. Continue taking this medication, and follow the directions you see here.   thiamine 100 MG tablet Take 100 mg by mouth daily.   traZODone 50 MG tablet Commonly known as: DESYREL Take 1 tablet (50 mg total) by mouth at bedtime as needed for sleep.       Follow-up Information    [facility] Follow up in 1 week(s).   Specialty: Family Medicine             Allergies  Allergen Reactions  . Bee Venom Swelling  . Nickel Rash    Consultations:  None   Procedures/Studies: US Carotid Bilateral  Result Date: 09/26/2020 CLINICAL DATA:  Syncope Temporal giant cell arteritis CHF EXAM: BILATERAL CAROTID DUPLEX ULTRASOUND TECHNIQUE: Pearline Cables scale imaging, color Doppler and duplex  ultrasound were performed of bilateral carotid and vertebral arteries in the neck. COMPARISON:  None. FINDINGS: Criteria: Quantification of carotid stenosis is based on velocity parameters that correlate the residual internal carotid diameter with NASCET-based stenosis levels, using the diameter of the distal internal carotid lumen as the denominator for stenosis measurement. The following velocity measurements were obtained: RIGHT ICA: 47/21 cm/sec CCA: [id number] cm/sec SYSTOLIC ICA/CCA RATIO:  0.6 ECA: 84 cm/sec LEFT ICA: 42/13 cm/sec CCA: [id number] cm/sec SYSTOLIC ICA/CCA RATIO:  0.5 ECA: 69 cm/sec RIGHT CAROTID ARTERY: Mild heterogeneous plaque of the carotid bulb without flow-limiting stenosis. RIGHT VERTEBRAL ARTERY:  Antegrade flow. LEFT CAROTID ARTERY: Mild heterogeneous plaque of the distal common carotid artery and carotid bulb without flow limiting stenosis. LEFT VERTEBRAL ARTERY:  Antegrade flow. IMPRESSION: No hemodynamically significant stenosis of the internal carotid arteries. Electronically Signed   By: Miachel Roux M.D.   On: 09/26/2020 09:34   DG Chest Port 1 View  Result Date: 09/25/2020 CLINICAL DATA:  Weakness EXAM: PORTABLE CHEST 1 VIEW COMPARISON:  07/12/2019 FINDINGS: Right lung is clear. Chronic pleural changes on the left. Stable cardiomediastinal silhouette with aortic atherosclerosis. No pneumothorax IMPRESSION: No active disease. Chronic pleural changes on the left. Electronically Signed   By: Donavan Foil M.D.   On: 09/25/2020 16:53   ECHOCARDIOGRAM COMPLETE  Result Date: 09/26/2020    ECHOCARDIOGRAM REPORT   Patient Name:   [name] Date of Exam: 09/26/2020 Medical Rec #:  [id number]     Height:       68.0 in Accession #:    [id number]    Weight:       300.0 lb Date of Birth:  [birth year]     BSA:          2.428 m Patient Age:    49 years      BP:           111/70 mmHg Patient Gender: M             HR:           101 bpm. Exam Location:  [facility] Procedure: Intracardiac  Opacification Agent Indications:    Syncope 780.2 / R55  History:        Patient has prior history of Echocardiogram examinations. CHF,                 Arrythmias:Atrial Fibrillation; Risk Factors:Hypertension and                 Former Smoker. Covid 19GERD.  Sonographer:    Leavy Cella RDCS (AE) Referring Phys: [phone] DAVID  MANUEL ORTIZ  Sonographer Comments: Patient is morbidly obese. Image acquisition challenging due to patient body habitus. IMPRESSIONS  1. Limited are limited, even with Definity contrast.  2. Left ventricular ejection fraction, by estimation, is approximately 50 to 55%. The left ventricle has low normal function. Left ventricular endocardial border not optimally defined to evaluate regional wall motion. There is mild left ventricular hypertrophy. Left ventricular diastolic parameters are indeterminate.  3. Right ventricular systolic function is low normal. The right ventricular size is normal. Tricuspid regurgitation signal is inadequate for assessing PA pressure.  4. The mitral valve is grossly normal. Trivial mitral valve regurgitation.  5. The aortic valve is tricuspid. Aortic valve regurgitation is not visualized. Mild aortic valve sclerosis is present, with no evidence of aortic valve stenosis.  6. The inferior vena cava is normal in size with greater than 50% respiratory variability, suggesting right atrial pressure of 3 mmHg. FINDINGS  Left Ventricle: Left ventricular ejection fraction, by estimation, is 50 to 55%. The left ventricle has low normal function. Left ventricular endocardial border not optimally defined to evaluate regional wall motion. Definity contrast agent was given IV  to delineate the left ventricular endocardial borders. The left ventricular internal cavity size was normal in size. There is mild left ventricular hypertrophy. Left ventricular diastolic parameters are indeterminate. Right Ventricle: The right ventricular size is normal. No increase in right  ventricular wall thickness. Right ventricular systolic function is low normal. Tricuspid regurgitation signal is inadequate for assessing PA pressure. Left Atrium: Left atrial size was normal in size. Right Atrium: Right atrial size was normal in size. Pericardium: There is no evidence of pericardial effusion. Mitral Valve: The mitral valve is grossly normal. Trivial mitral valve regurgitation. Tricuspid Valve: The tricuspid valve is grossly normal. Tricuspid valve regurgitation is trivial. Aortic Valve: The aortic valve is tricuspid. There is mild aortic valve annular calcification. Aortic valve regurgitation is not visualized. Mild aortic valve sclerosis is present, with no evidence of aortic valve stenosis. Pulmonic Valve: The pulmonic valve was not well visualized. Pulmonic valve regurgitation is not visualized. Aorta: The aortic root is normal in size and structure. Venous: The inferior vena cava is normal in size with greater than 50% respiratory variability, suggesting right atrial pressure of 3 mmHg. IAS/Shunts: No atrial level shunt detected by color flow Doppler.  LEFT VENTRICLE PLAX 2D LVIDd:         4.20 cm LVIDs:         3.30 cm LV PW:         1.50 cm LV IVS:        1.20 cm  RIGHT VENTRICLE RV S prime:     11.60 cm/s TAPSE (M-mode): 1.8 cm LEFT ATRIUM           Index LA Vol (A2C): 20.7 ml 8.53 ml/m LA Vol (A4C): 54.2 ml 22.32 ml/m   AORTA Ao Root diam: 3.70 cm MITRAL VALVE MV Area (PHT): 2.88 cm MV Decel Time: 263 msec MV E velocity: 60.20 cm/s Rozann Lesches MD Electronically signed by Rozann Lesches MD Signature Date/Time: 09/26/2020/4:36:27 PM    Final       Discharge Exam: Vitals:   09/27/20 2015 09/28/20 0438  BP: 115/71 113/68  Pulse: 92 72  Resp: 18 20  Temp: 99.8 F (37.7 C) 98.2 F (36.8 C)  SpO2: 95% 97%   Vitals:   09/27/20 1536 09/27/20 2015 09/28/20 0435 09/28/20 0438  BP: 112/70 115/71  113/68  Pulse: 92 92  72  Resp: 18 18  20   Temp: 98.5 F (36.9 C) 99.8 F  (37.7 C)  98.2 F (36.8 C)  TempSrc: Rectal Oral  Oral  SpO2: 97% 95%  97%  Weight:   (!) 138.1 kg   Height:        General: Pt is alert, awake, not in acute distress Cardiovascular: RRR, S1/S2 +, no rubs, no gallops Respiratory: CTA bilaterally, no wheezing, no rhonchi Abdominal: Soft, NT, ND, bowel sounds + Extremities: no edema, no cyanosis    The results of significant diagnostics from this hospitalization (including imaging, microbiology, ancillary and laboratory) are listed below for reference.     Microbiology: Recent Results (from the past 240 hour(s))  SARS Coronavirus 2 by RT PCR (hospital order, performed in [facility] hospital lab) Nasopharyngeal Nasopharyngeal Swab     Status: Abnormal   Collection Time: 09/25/20  4:45 PM   Specimen: Nasopharyngeal Swab  Result Value Ref Range Status   SARS Coronavirus 2 POSITIVE (A) NEGATIVE Final    Comment: RESULT CALLED TO, READ BACK BY AND VERIFIED WITH: BRANDY OAKLEY @1813  09/25/20 BY JONES,T (NOTE) SARS-CoV-2 target nucleic acids are DETECTED  SARS-CoV-2 RNA is generally detectable in upper respiratory specimens  during the acute phase of infection.  Positive results are indicative  of the presence of the identified virus, but do not rule out bacterial infection or co-infection with other pathogens not detected by the test.  Clinical correlation with patient history and  other diagnostic information is necessary to determine patient infection status.  The expected result is negative.  Fact Sheet for Patients:   StrictlyIdeas.no   Fact Sheet for Healthcare Providers:   BankingDealers.co.za    This test is not yet approved or cleared by the [place] FDA and  has been authorized for detection and/or diagnosis of SARS-CoV-2 by FDA under an Emergency Use Authorization (EUA).  This EUA will remain in effect (meaning thi s test can be used) for the duration of  the  COVID-19 declaration under Section 564(b)(1) of the Act, 21 U.S.C. section 360-bbb-3(b)(1), unless the authorization is terminated or revoked sooner.  Performed at [facility], [address] [place], [place] [address]   Gastrointestinal Panel by PCR , Stool     Status: None   Collection Time: 09/26/20  3:26 PM   Specimen: Stool  Result Value Ref Range Status   Campylobacter species NOT DETECTED NOT DETECTED Final   Plesimonas shigelloides NOT DETECTED NOT DETECTED Final   Salmonella species NOT DETECTED NOT DETECTED Final   Yersinia enterocolitica NOT DETECTED NOT DETECTED Final   Vibrio species NOT DETECTED NOT DETECTED Final   Vibrio cholerae NOT DETECTED NOT DETECTED Final   Enteroaggregative E coli (EAEC) NOT DETECTED NOT DETECTED Final   Enteropathogenic E coli (EPEC) NOT DETECTED NOT DETECTED Final   Enterotoxigenic E coli (ETEC) NOT DETECTED NOT DETECTED Final   Shiga like toxin producing E coli (STEC) NOT DETECTED NOT DETECTED Final   Shigella/Enteroinvasive E coli (EIEC) NOT DETECTED NOT DETECTED Final   Cryptosporidium NOT DETECTED NOT DETECTED Final   Cyclospora cayetanensis NOT DETECTED NOT DETECTED Final   Entamoeba histolytica NOT DETECTED NOT DETECTED Final   Giardia lamblia NOT DETECTED NOT DETECTED Final   Adenovirus F40/41 NOT DETECTED NOT DETECTED Final   Astrovirus NOT DETECTED NOT DETECTED Final   Norovirus GI/GII NOT DETECTED NOT DETECTED Final   Rotavirus A NOT DETECTED NOT DETECTED Final   Sapovirus (I, II, IV, and V) NOT DETECTED NOT  DETECTED Final    Comment: Performed at [facility], [address] [place], [place] [address]     Labs: BNP (last 3 results) Recent Labs    09/25/20 2042  BNP [id number]   Basic Metabolic Panel: Recent Labs  Lab 09/25/20 1635 09/26/20 0542 09/27/20 0641 09/28/20 0416 09/28/20 1009 09/28/20 1400  NA 133* 138  138 136 136  --   --   K 5.7* 4.8  4.8 4.9 5.2* 5.4* 4.2  CL 104 107  107 108 107  --   --    CO2 19* 23  24 23 25   --   --   GLUCOSE 145* 101*  102* 88 87  --   --   BUN 29* 28*  27* 24* 24*  --   --   CREATININE 1.86* 1.38*  1.40* 1.27* 1.30*  --   --   CALCIUM 9.1 8.7*  8.7* 8.8* 8.9  --   --    Liver Function Tests: Recent Labs  Lab 09/25/20 1635 09/26/20 0542 09/27/20 0641 09/28/20 0416  AST 35 21 22 22   ALT 27 20 20 22   ALKPHOS 74 62 60 60  BILITOT 0.4 0.3 0.7 0.5  PROT 7.9 6.3* 6.1* 6.0*  ALBUMIN 3.7 3.2* 2.9* 2.9*   No results for input(s): LIPASE, AMYLASE in the last 168 hours. Recent Labs  Lab 09/25/20 2200  AMMONIA 10   CBC: Recent Labs  Lab 09/25/20 1635 09/25/20 2200 09/26/20 0542 09/26/20 0933 09/26/20 2159 09/27/20 0641 09/27/20 2156 09/28/20 0416 09/28/20 1009  WBC 4.7  --  4.1  4.2  --   --  4.0  --  4.8  --   NEUTROABS 3.5  --  2.1  --   --  1.8  --  2.5  --   HGB 15.3   < > 13.6  13.9   < > 13.0 12.8* 12.1* 12.7* 12.8*  HCT 49.8   < > 43.6  43.2   < > 40.7 41.0 38.2* 40.7 40.4  MCV 103.8*  --  100.9*  100.9*  --   --  101.0*  --  101.5*  --   PLT 143*  --  136*  150  --   --  144*  --  171  --    < > = values in this interval not displayed.   Cardiac Enzymes: No results for input(s): CKTOTAL, CKMB, CKMBINDEX, TROPONINI in the last 168 hours. BNP: Invalid input(s): POCBNP CBG: Recent Labs  Lab 09/26/20 0945 09/27/20 1543 09/28/20 0428  GLUCAP 87 101* 88   D-Dimer Recent Labs    09/27/20 0641 09/28/20 0416  DDIMER 0.66* 0.88*   Hgb A1c No results for input(s): HGBA1C in the last 72 hours. Lipid Profile No results for input(s): CHOL, HDL, LDLCALC, TRIG, CHOLHDL, LDLDIRECT in the last 72 hours. Thyroid function studies No results for input(s): TSH, T4TOTAL, T3FREE, THYROIDAB in the last 72 hours.  Invalid input(s): FREET3 Anemia work up Recent Labs    09/26/20 0542 09/27/20 0641 09/28/20 0416  VITAMINB12 858  --   --   FOLATE 19.6  --   --   FERRITIN 479* 441* 428*   Urinalysis    Component Value  Date/Time   COLORURINE YELLOW 07/12/2019 1953   APPEARANCEUR HAZY (A) 07/12/2019 1953   APPEARANCEUR Clear 01/18/2018 1539   LABSPEC 1.013 07/12/2019 1953   PHURINE 5.0 07/12/2019 1953   GLUCOSEU NEGATIVE 07/12/2019 1953   HGBUR NEGATIVE 07/12/2019 1953  BILIRUBINUR NEGATIVE 07/12/2019 1953   BILIRUBINUR Negative 01/18/2018 1539   KETONESUR NEGATIVE 07/12/2019 1953   PROTEINUR NEGATIVE 07/12/2019 1953   UROBILINOGEN 0.2 09/06/2012 0213   NITRITE NEGATIVE 07/12/2019 1953   LEUKOCYTESUR SMALL (A) 07/12/2019 1953   Sepsis Labs Invalid input(s): PROCALCITONIN,  WBC,  LACTICIDVEN Microbiology Recent Results (from the past 240 hour(s))  SARS Coronavirus 2 by RT PCR (hospital order, performed in [facility] hospital lab) Nasopharyngeal Nasopharyngeal Swab     Status: Abnormal   Collection Time: 09/25/20  4:45 PM   Specimen: Nasopharyngeal Swab  Result Value Ref Range Status   SARS Coronavirus 2 POSITIVE (A) NEGATIVE Final    Comment: RESULT CALLED TO, READ BACK BY AND VERIFIED WITH: BRANDY OAKLEY @1813  09/25/20 BY JONES,T (NOTE) SARS-CoV-2 target nucleic acids are DETECTED  SARS-CoV-2 RNA is generally detectable in upper respiratory specimens  during the acute phase of infection.  Positive results are indicative  of the presence of the identified virus, but do not rule out bacterial infection or co-infection with other pathogens not detected by the test.  Clinical correlation with patient history and  other diagnostic information is necessary to determine patient infection status.  The expected result is negative.  Fact Sheet for Patients:   StrictlyIdeas.no   Fact Sheet for Healthcare Providers:   BankingDealers.co.za    This test is not yet approved or cleared by the [place] FDA and  has been authorized for detection and/or diagnosis of SARS-CoV-2 by FDA under an Emergency Use Authorization (EUA).  This EUA will remain in  effect (meaning thi s test can be used) for the duration of  the COVID-19 declaration under Section 564(b)(1) of the Act, 21 U.S.C. section 360-bbb-3(b)(1), unless the authorization is terminated or revoked sooner.  Performed at [facility], [address] [place], [place] [address]   Gastrointestinal Panel by PCR , Stool     Status: None   Collection Time: 09/26/20  3:26 PM   Specimen: Stool  Result Value Ref Range Status   Campylobacter species NOT DETECTED NOT DETECTED Final   Plesimonas shigelloides NOT DETECTED NOT DETECTED Final   Salmonella species NOT DETECTED NOT DETECTED Final   Yersinia enterocolitica NOT DETECTED NOT DETECTED Final   Vibrio species NOT DETECTED NOT DETECTED Final   Vibrio cholerae NOT DETECTED NOT DETECTED Final   Enteroaggregative E coli (EAEC) NOT DETECTED NOT DETECTED Final   Enteropathogenic E coli (EPEC) NOT DETECTED NOT DETECTED Final   Enterotoxigenic E coli (ETEC) NOT DETECTED NOT DETECTED Final   Shiga like toxin producing E coli (STEC) NOT DETECTED NOT DETECTED Final   Shigella/Enteroinvasive E coli (EIEC) NOT DETECTED NOT DETECTED Final   Cryptosporidium NOT DETECTED NOT DETECTED Final   Cyclospora cayetanensis NOT DETECTED NOT DETECTED Final   Entamoeba histolytica NOT DETECTED NOT DETECTED Final   Giardia lamblia NOT DETECTED NOT DETECTED Final   Adenovirus F40/41 NOT DETECTED NOT DETECTED Final   Astrovirus NOT DETECTED NOT DETECTED Final   Norovirus GI/GII NOT DETECTED NOT DETECTED Final   Rotavirus A NOT DETECTED NOT DETECTED Final   Sapovirus (I, II, IV, and V) NOT DETECTED NOT DETECTED Final    Comment: Performed at [facility], [address] [place], [place] [address]     Time coordinating discharge: 35 minutes  SIGNED:   Rodena Goldmann, DO [facility] 09/28/2020, 3:07 PM  If 7PM-7AM, please contact night-coverage [email or web address]

## 2020-09-28 NOTE — Plan of Care (Signed)

## 2020-10-02 ENCOUNTER — Encounter: Payer: Self-pay | Admitting: Internal Medicine

## 2020-11-14 ENCOUNTER — Ambulatory Visit: Payer: Medicare Other | Admitting: Nurse Practitioner

## 2023-10-25 ENCOUNTER — Emergency Department (HOSPITAL_COMMUNITY): Payer: Medicare Other

## 2023-10-25 ENCOUNTER — Other Ambulatory Visit: Payer: Self-pay

## 2023-10-25 ENCOUNTER — Encounter (HOSPITAL_COMMUNITY): Payer: Self-pay

## 2023-10-25 ENCOUNTER — Inpatient Hospital Stay (HOSPITAL_COMMUNITY)
Admission: EM | Admit: 2023-10-25 | Discharge: 2023-10-31 | DRG: 312 | Disposition: A | Discharge status: Home Health | Payer: Medicare Other | Attending: Internal Medicine | Admitting: Internal Medicine

## 2023-10-25 DIAGNOSIS — R55 Syncope and collapse: Secondary | ICD-10-CM | POA: Diagnosis not present

## 2023-10-25 DIAGNOSIS — I5033 Acute on chronic diastolic (congestive) heart failure: Secondary | ICD-10-CM | POA: Diagnosis present

## 2023-10-25 DIAGNOSIS — M549 Dorsalgia, unspecified: Secondary | ICD-10-CM | POA: Diagnosis not present

## 2023-10-25 DIAGNOSIS — Z87892 Personal history of anaphylaxis: Secondary | ICD-10-CM

## 2023-10-25 DIAGNOSIS — I482 Chronic atrial fibrillation, unspecified: Secondary | ICD-10-CM | POA: Diagnosis present

## 2023-10-25 DIAGNOSIS — I5032 Chronic diastolic (congestive) heart failure: Secondary | ICD-10-CM | POA: Diagnosis not present

## 2023-10-25 DIAGNOSIS — I951 Orthostatic hypotension: Secondary | ICD-10-CM | POA: Diagnosis not present

## 2023-10-25 DIAGNOSIS — M316 Other giant cell arteritis: Secondary | ICD-10-CM | POA: Diagnosis present

## 2023-10-25 DIAGNOSIS — K219 Gastro-esophageal reflux disease without esophagitis: Secondary | ICD-10-CM | POA: Diagnosis present

## 2023-10-25 DIAGNOSIS — Z6841 Body Mass Index (BMI) 40.0 and over, adult: Secondary | ICD-10-CM

## 2023-10-25 DIAGNOSIS — D649 Anemia, unspecified: Secondary | ICD-10-CM | POA: Diagnosis present

## 2023-10-25 DIAGNOSIS — G8929 Other chronic pain: Secondary | ICD-10-CM | POA: Diagnosis present

## 2023-10-25 DIAGNOSIS — Z7901 Long term (current) use of anticoagulants: Secondary | ICD-10-CM

## 2023-10-25 DIAGNOSIS — Z91030 Bee allergy status: Secondary | ICD-10-CM

## 2023-10-25 DIAGNOSIS — Z87891 Personal history of nicotine dependence: Secondary | ICD-10-CM

## 2023-10-25 DIAGNOSIS — I10 Essential (primary) hypertension: Secondary | ICD-10-CM | POA: Diagnosis present

## 2023-10-25 DIAGNOSIS — G629 Polyneuropathy, unspecified: Secondary | ICD-10-CM | POA: Diagnosis present

## 2023-10-25 DIAGNOSIS — I110 Hypertensive heart disease with heart failure: Secondary | ICD-10-CM | POA: Diagnosis present

## 2023-10-25 DIAGNOSIS — E872 Acidosis, unspecified: Secondary | ICD-10-CM | POA: Diagnosis present

## 2023-10-25 DIAGNOSIS — Z811 Family history of alcohol abuse and dependence: Secondary | ICD-10-CM

## 2023-10-25 DIAGNOSIS — G4733 Obstructive sleep apnea (adult) (pediatric): Secondary | ICD-10-CM

## 2023-10-25 DIAGNOSIS — Z91048 Other nonmedicinal substance allergy status: Secondary | ICD-10-CM

## 2023-10-25 DIAGNOSIS — I4821 Permanent atrial fibrillation: Secondary | ICD-10-CM | POA: Diagnosis present

## 2023-10-25 DIAGNOSIS — Z96643 Presence of artificial hip joint, bilateral: Secondary | ICD-10-CM | POA: Diagnosis present

## 2023-10-25 DIAGNOSIS — Z79899 Other long term (current) drug therapy: Secondary | ICD-10-CM

## 2023-10-25 DIAGNOSIS — M170 Bilateral primary osteoarthritis of knee: Secondary | ICD-10-CM | POA: Diagnosis present

## 2023-10-25 DIAGNOSIS — E860 Dehydration: Secondary | ICD-10-CM | POA: Diagnosis present

## 2023-10-25 HISTORY — DX: Chronic atrial fibrillation, unspecified: I48.20

## 2023-10-25 LAB — BASIC METABOLIC PANEL
Anion gap: 10 (ref 5–15)
BUN: 21 mg/dL (ref 8–23)
CO2: 27 mmol/L (ref 22–32)
Calcium: 9.3 mg/dL (ref 8.9–10.3)
Chloride: 102 mmol/L (ref 98–111)
Creatinine, Ser: 1.07 mg/dL (ref 0.61–1.24)
GFR, Estimated: >60 mL/min (ref >60)
Glucose, Bld: 102 mg/dL — ABNORMAL HIGH (ref 70–99)
Potassium: 4.5 mmol/L (ref 3.5–5.1)
Sodium: 139 mmol/L (ref 135–145)

## 2023-10-25 LAB — CBC
HCT: 41.1 % (ref 39.0–52.0)
Hemoglobin: 12.9 g/dL — ABNORMAL LOW (ref 13.0–17.0)
MCH: 31.9 pg (ref 26.0–34.0)
MCHC: 31.4 g/dL (ref 30.0–36.0)
MCV: 101.5 fL — ABNORMAL HIGH (ref 80.0–100.0)
Platelets: 181 10*3/uL (ref 150–400)
RBC: 4.05 MIL/uL — ABNORMAL LOW (ref 4.22–5.81)
RDW: 14 % (ref 11.5–15.5)
WBC: 5.6 10*3/uL (ref 4.0–10.5)
nRBC: 0 % (ref 0.0–0.2)

## 2023-10-25 LAB — BRAIN NATRIURETIC PEPTIDE: B Natriuretic Peptide: 148 pg/mL — ABNORMAL HIGH (ref 0.0–100.0)

## 2023-10-25 LAB — LACTIC ACID, PLASMA
Lactic Acid, Venous: 1.7 mmol/L (ref 0.5–1.9)
Lactic Acid, Venous: 2 mmol/L (ref 0.5–1.9)
Lactic Acid, Venous: 1.7 mmol/L (ref 0.5–1.9)

## 2023-10-25 LAB — TROPONIN I (HIGH SENSITIVITY)
Troponin I (High Sensitivity): 5 ng/L (ref <18)
Troponin I (High Sensitivity): 7 ng/L (ref <18)

## 2023-10-25 MED ORDER — MIDODRINE HCL 5 MG PO TABS: 5.0000 mg | ORAL_TABLET | Freq: Three times a day (TID) | ORAL | Status: DC

## 2023-10-25 MED ORDER — POLYETHYLENE GLYCOL 3350 17 G PO PACK
17.0000 g | PACK | Freq: Every day | ORAL | Status: DC | PRN
  Filled 2023-10-25: qty 1

## 2023-10-25 MED ORDER — ONDANSETRON HCL 4 MG/2ML IJ SOLN
4.0000 mg | Freq: Four times a day (QID) | INTRAMUSCULAR | Status: DC | PRN
Start: 2023-10-25 — End: 2023-11-01
  Administered 2023-10-26 (×2): 4 mg via INTRAVENOUS
  Filled 2023-10-25 (×3): qty 2

## 2023-10-25 MED ORDER — GABAPENTIN 300 MG PO CAPS
600.0000 mg | ORAL_CAPSULE | Freq: Every day | ORAL | Status: DC
  Administered 2023-10-26 – 2023-10-31 (×6): 600 mg via ORAL
  Filled 2023-10-25 (×6): qty 2

## 2023-10-25 MED ORDER — GABAPENTIN 300 MG PO CAPS: 300.0000 mg | ORAL_CAPSULE | Freq: Three times a day (TID) | ORAL | Status: DC

## 2023-10-25 MED ORDER — RIVAROXABAN 20 MG PO TABS
20.0000 mg | ORAL_TABLET | Freq: Every evening | ORAL | Status: DC
  Administered 2023-10-25 – 2023-10-31 (×7): 20 mg via ORAL
  Filled 2023-10-25 (×7): qty 1

## 2023-10-25 MED ORDER — PANTOPRAZOLE SODIUM 40 MG PO TBEC
40.0000 mg | DELAYED_RELEASE_TABLET | Freq: Every day | ORAL | Status: DC
Start: 2023-10-26 — End: 2023-11-01
  Administered 2023-10-26 – 2023-10-31 (×6): 40 mg via ORAL
  Filled 2023-10-25 (×6): qty 1

## 2023-10-25 MED ORDER — TRAZODONE HCL 50 MG PO TABS
50.0000 mg | ORAL_TABLET | Freq: Every evening | ORAL | Status: DC | PRN
  Administered 2023-10-26 – 2023-10-30 (×5): 50 mg via ORAL
  Filled 2023-10-25 (×5): qty 1

## 2023-10-25 MED ORDER — ESCITALOPRAM OXALATE 10 MG PO TABS
10.0000 mg | ORAL_TABLET | Freq: Every day | ORAL | Status: DC
  Administered 2023-10-26 – 2023-10-31 (×6): 10 mg via ORAL
  Filled 2023-10-25 (×6): qty 1

## 2023-10-25 MED ORDER — SODIUM CHLORIDE 0.9 % IV BOLUS
500.0000 mL | Freq: Once | INTRAVENOUS | Status: AC
  Administered 2023-10-25: 500 mL via INTRAVENOUS

## 2023-10-25 MED ORDER — SODIUM CHLORIDE 0.9 % IV SOLN: INTRAVENOUS | Status: AC

## 2023-10-25 MED ORDER — HYDROCODONE-ACETAMINOPHEN 10-325 MG PO TABS
0.5000 | ORAL_TABLET | Freq: Four times a day (QID) | ORAL | Status: DC | PRN
  Administered 2023-10-25 – 2023-10-31 (×15): 1 via ORAL
  Filled 2023-10-25 (×18): qty 1

## 2023-10-25 MED ORDER — GABAPENTIN 300 MG PO CAPS
300.0000 mg | ORAL_CAPSULE | Freq: Every day | ORAL | Status: DC
  Administered 2023-10-25 – 2023-10-30 (×6): 300 mg via ORAL
  Filled 2023-10-25 (×6): qty 1

## 2023-10-25 MED ORDER — ONDANSETRON HCL 4 MG PO TABS: 4.0000 mg | ORAL_TABLET | Freq: Four times a day (QID) | ORAL | Status: DC | PRN

## 2023-10-25 MED ORDER — MELATONIN 3 MG PO TABS
6.0000 mg | ORAL_TABLET | Freq: Every day | ORAL | Status: DC
  Administered 2023-10-25 – 2023-10-30 (×6): 6 mg via ORAL
  Filled 2023-10-25 (×6): qty 2

## 2023-10-25 MED ORDER — MIDODRINE HCL 5 MG PO TABS
10.0000 mg | ORAL_TABLET | Freq: Once | ORAL | Status: AC
  Administered 2023-10-25: 10 mg via ORAL
  Filled 2023-10-25: qty 2

## 2023-10-25 MED ORDER — ACETAMINOPHEN 650 MG RE SUPP: 650.0000 mg | Freq: Four times a day (QID) | RECTAL | Status: DC | PRN

## 2023-10-25 MED ORDER — SODIUM CHLORIDE 0.9 % IV BOLUS
1000.0000 mL | Freq: Once | INTRAVENOUS | Status: AC
  Administered 2023-10-25: 1000 mL via INTRAVENOUS

## 2023-10-25 MED ORDER — ACETAMINOPHEN 325 MG PO TABS
650.0000 mg | ORAL_TABLET | Freq: Four times a day (QID) | ORAL | Status: DC | PRN
  Administered 2023-10-26 – 2023-10-29 (×4): 650 mg via ORAL
  Filled 2023-10-25 (×4): qty 2

## 2023-10-25 NOTE — Assessment & Plan Note (Addendum)
 Rates currently 96-117, may benefit from better rate control.  Metoprolol discontinued due to hypotension.  Would not start calcium channel blockers also for same reason. -Cardiology consult in a.m., if we need to consider antiarrhythmics or other modalities -Resume Xarelto

## 2023-10-25 NOTE — H&P (Signed)
 History and Physical    [name]:[id number] DOB: May 01, 1946 DOA: 10/25/2023  PCP: Medicine, [facility]   Patient coming from: Home  I have personally briefly reviewed patient's old medical records in [facility]  Chief Complaint: Low blood pressure  HPI: [name] is a 78 y.o. male with medical history significant for atrial fibrillation, diastolic CHF, obesity, OSA.  Patient presented to the ED with complaints of low blood pressure.  Reports that started about 3 months ago, he was subsequently taken off his metoprolol with some improvement.  But over the past week he has had recurrence of his low blood pressure, with dizziness feeling like his vision was going black.  Today he had the worst symptoms, feeling like he was going to pass out, but he did not.  Reports chronic poor oral intake, no vomiting or diarrhea.  He is on Lasix 20 mg daily. Reports some mild chest tightness to the left side of his chest nonradiating yesterday while he was resting and resolved.  He thinks this is related to his cough.  No lower extremity swelling.  ED Course: Tmax 98.3.  Heart rate 96-117.  Respirate rate 14-30.  Blood pressure systolic down to 47/42 improved with fluid bolus.  Sats mostly 91 to 97% on room air.   Troponin 5 >7.  EKG shows chronic A-fib, without changes. Lactic acid 1.7 >>2. 1 L bolus given. Chest x-ray without acute abnormality.  Head CT negative for acute abnormality. EDP talked to cardiology, continue to admit here. Midodrine resumed.   Review of Systems: As per HPI all other systems reviewed and negative.  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    Past Surgical History:  Procedure Laterality Date   APPENDECTOMY     ARTERY BIOPSY Left 12/30/2017   Procedure: BIOPSY TEMPORAL ARTERY;  Surgeon: Frank Kelp, Cooper;  Location: [facility];  Service: General;  Laterality: Left;   CHOLECYSTECTOMY     COLONOSCOPY W/ POLYPECTOMY     ESOPHAGOGASTRODUODENOSCOPY (EGD) WITH PROPOFOL  07/06/2012   Procedure: ESOPHAGOGASTRODUODENOSCOPY (EGD) WITH PROPOFOL;  Surgeon: Frank Kuba, Cooper;  Location: [facility];  Service: Endoscopy;  Laterality: N/A;   ESOPHAGOSCOPY     EYE SURGERY     left eye- muscle repair   HERNIA REPAIR     ventral hernia   INCISION AND DRAINAGE HIP Left 03/17/2018   Procedure: IRRIGATION AND DEBRIDEMENT LEFT THIGH WOUND;  Surgeon: Frank Gross, Cooper;  Location: [facility];  Service: Orthopedics;  Laterality: Left;   JOINT REPLACEMENT     bilateral hips.  Right broke and had to  be re placed again   MASS EXCISION N/A 03/02/2015   Procedure: EXCISION ABDOMINAL WALL MASS;  Surgeon: Frank Kin, Cooper;  Location: [facility];  Service: General;  Laterality: N/A;   TOTAL HIP REVISION Left 02/17/2018   Procedure: Left total hip arthroplasty revision;  Surgeon: Frank Gross, Cooper;  Location: [facility];  Service: Orthopedics;  Laterality: Left;   VERTICAL BANDED GASTROPLASTY       reports that he quit smoking about 39 years ago. His smoking use included cigarettes. He started smoking about 57 years ago. He has a 35.6 pack-year smoking history. He has never used smokeless tobacco. He reports that he  does not currently use alcohol. He reports that he does not use drugs.  Allergies  Allergen Reactions   Bee Venom Anaphylaxis and Other (See Comments)    Noted from Floyd. honey bee venom   Nickel Rash and Other (See Comments)    "Breaks me out"    Family History  Problem Relation Age of Onset   Cancer Mother    Alcohol abuse Father     Prior to Admission medications   Medication Sig Start Date End Date Taking? Authorizing Provider  albuterol (PROVENTIL HFA;VENTOLIN HFA) 108 (90 Base) MCG/ACT inhaler Inhale 1-2 puffs into the lungs every 6 (six) hours as needed for wheezing or shortness of breath. 09/02/18  Yes Frank, Maximino Greenland, Cooper  alum & mag hydroxide-simeth (MAALOX/MYLANTA) 200-200-20 MG/5ML suspension Take 15 mLs by mouth every 6 (six) hours as needed for indigestion or heartburn.   Yes Provider, Historical, Cooper  cyclobenzaprine (FLEXERIL) 5 MG tablet Take 5 mg by mouth 3 (three) times daily as needed for muscle spasms. 09/16/23  Yes Provider, Historical, Cooper  Ensure Max Protein (ENSURE MAX PROTEIN) LIQD Take 330 mLs (11 oz total) by mouth 2 (two) times daily. 12/07/18  Yes Frank Sea, Cooper  escitalopram (LEXAPRO) 10 MG tablet Take 1 tablet (10 mg total) by mouth daily. 09/02/18  Yes Frank, Maximino Greenland, Cooper  ferrous sulfate 325 (65 FE) MG tablet Take 1 tablet (325 mg total) by mouth 3 (three) times daily with meals. 09/02/18  Yes Frank, Maximino Greenland, Cooper  furosemide (LASIX) 20 MG tablet Take 20 mg by mouth daily.   Yes Provider, Historical, Cooper  gabapentin (NEURONTIN) 300 MG capsule Take 1 capsule (300 mg total) by mouth 3 (three) times daily. Patient taking differently: Take 300 mg by mouth See admin instructions. Take 2 capsules every morning and 3 capsules at bedtime 12/07/18  Yes Frank Sea, Cooper  HYDROcodone-acetaminophen (NORCO) 10-325 MG tablet Take 0.5-1 tablets by mouth every 6 (six) hours as needed for moderate pain (pain score 4-6). 08/17/23  Yes Provider, Historical, Cooper   Melatonin 3 MG TABS Take 2 tablets (6 mg total) by mouth at bedtime. Patient taking differently: Take 3 mg by mouth at bedtime. Plus 20 mg in another supplement 09/11/18  Yes Osvaldo Shipper, Cooper  Multiple Vitamins-Minerals (CENTRUM ADULTS PO) Take 1 tablet by mouth every evening.    Yes Provider, Historical, Cooper  pantoprazole (PROTONIX) 40 MG tablet Take 1 tablet (40 mg total) by mouth daily. 09/02/18  Yes Frank, Maximino Greenland, Cooper  promethazine (PHENERGAN) 25 MG tablet Take 25 mg by mouth every 6 (six) hours as needed for nausea or vomiting. 09/22/23  Yes Provider, Historical, Cooper  Pseudoeph-Doxylamine-DM-APAP (NYQUIL PO) Take 15 mLs by mouth at bedtime as needed (congestion/cough).   Yes Provider, Historical, Cooper  traZODone (DESYREL) 50 MG tablet Take 1  tablet (50 mg total) by mouth at bedtime as needed for sleep. 11/26/18  Yes Ghimire, Lyndel Safe, Cooper  XARELTO 20 MG TABS tablet Take 20 mg by mouth every evening. 09/18/20  Yes Provider, Historical, Cooper    Physical Exam: Vitals:   10/25/23 1745 10/25/23 1800 10/25/23 1815 10/25/23 1830  BP: 128/75 (!) 133/96 135/87 (!) 131/93  Pulse: 100 (!) 111 (!) 106 99  Resp: 17 19 (!) 26 18  Temp:      TempSrc:      SpO2: 95% 96% 97% 91%  Weight:      Height:        Constitutional: NAD, calm, comfortable Vitals:   10/25/23 1745 10/25/23 1800 10/25/23 1815 10/25/23 1830  BP: 128/75 (!) 133/96 135/87 (!) 131/93  Pulse: 100 (!) 111 (!) 106 99  Resp: 17 19 (!) 26 18  Temp:      TempSrc:      SpO2: 95% 96% 97% 91%  Weight:      Height:       Eyes: PERRL, lids and conjunctivae normal ENMT: Mucous membranes are moist.  Neck: normal, supple, no masses, no thyromegaly Respiratory: clear to auscultation bilaterally, no wheezing, no crackles.  Cardiovascular: Irregular rate and rhythm, no murmurs / rubs / gallops. No extremity edema. Abdomen: obese, chronic moderate right sided tenderness on palpation, no masses palpated. No hepatosplenomegaly. Bowel sounds positive.   Musculoskeletal: no clubbing / cyanosis. No joint deformity upper and lower extremities.  Skin: no rashes, lesions, ulcers. No induration Neurologic: No facial symmetry, moving extremities spontaneously, speech fluent. Psychiatric: Normal judgment and insight. Alert and oriented x 3. Normal mood.   Labs on Admission: I have personally reviewed following labs and imaging studies  CBC: Recent Labs  Lab 10/25/23 1541  WBC 5.6  HGB 12.9*  HCT 41.1  MCV 101.5*  PLT 181   Basic Metabolic Panel: Recent Labs  Lab 10/25/23 1541  NA 139  K 4.5  CL 102  CO2 27  GLUCOSE 102*  BUN 21  CREATININE 1.07  CALCIUM 9.3   Radiological Exams on Admission: CT Head Wo Contrast Result Date: 10/25/2023 CLINICAL DATA:  Syncope/presyncope, cerebrovascular cause suspected EXAM: CT HEAD WITHOUT CONTRAST TECHNIQUE: Contiguous axial images were obtained from the base of the skull through the vertex without intravenous contrast. RADIATION DOSE REDUCTION: This exam was performed according to the departmental dose-optimization program which includes automated exposure control, adjustment of the mA and/or kV according to patient size and/or use of iterative reconstruction technique. COMPARISON:  None Available. FINDINGS: Brain: No evidence of acute infarction, hemorrhage, hydrocephalus, extra-axial collection or mass lesion/mass effect. Patchy white matter hypodensities are nonspecific but compatible with chronic microvascular ischemic change. Vascular: No mastoid effusions. Skull: No acute fracture. Sinuses/Orbits: Right maxillary sinus mucosal thickening no acute orbital findings. Other: No mastoid effusions. IMPRESSION: No evidence of acute intracranial abnormality. Electronically Signed   By: Feliberto Harts M.D.   On: 10/25/2023 15:17   DG Chest Port 1 View Result Date: 10/25/2023 CLINICAL DATA:  Syncope, chest pain, hypotension EXAM: PORTABLE CHEST 1 VIEW COMPARISON:  09/25/2020 FINDINGS: 2 frontal views  of the chest demonstrate a stable enlarged cardiac silhouette. Continued ectasia and atherosclerosis of the thoracic aorta. There is chronic scarring within the left perihilar region, with diffuse left-sided pleural calcifications unchanged. No acute airspace disease, effusion, or pneumothorax. No acute bony abnormalities. IMPRESSION: 1. Chronic left pleural calcifications and left perihilar scarring. No acute intrathoracic process. Electronically Signed   By: Casimiro Needle  Manson Passey M.D.   On: 10/25/2023 15:07   EKG: Independently reviewed.  Atrial fibrillation, rate 108, QTc 458.  No significant changes from prior.  Assessment/Plan Principal Problem:   Near syncope Active Problems:   Hypertension   Chronic diastolic CHF (congestive heart failure) (HCC)   OSA on CPAP   Chronic atrial fibrillation (HCC)   Chronic back pain  Assessment and Plan: * Near syncope Likely due to hypotension.  Reports chronic unchanged poor oral intake.  No GI losses.  Metoprolol was discontinued a few months ago for same.  He is currently on Lasix. Lactic acidosis 1.7>> 2, due to likely from hypotension and poor tissue perfusion.  Low suspicion for infectious etiology.  - Now total of 1.5 L bolus given with improvement in blood pressure, continue N/s 75cc/hr x 12hrs - Midodrine 10 mg given in ED, monitor off midodrine. Restart if hypotensive despite fluids. -Obtain echo -A.m. cortisol - Trend lactic acid   Chronic atrial fibrillation (HCC) Rates currently 96-117, may benefit from better rate control.  Metoprolol discontinued due to hypotension.  Would not start calcium channel blockers also for same reason. -Cardiology consult in a.m., if we need to consider antiarrhythmics or other modalities -Resume Xarelto  Chronic back pain Resume home hydrocodone/acetaminophen 10/325  OSA on CPAP CPAP nightly  Chronic diastolic CHF (congestive heart failure) (HCC) Stable and compensated.  Last echo 2022 with EF of 50 to  55%. -Hold Lasix 20 mg daily for now with hypotension  Hypertension History of hypertension presenting with hypotension.   DVT prophylaxis: Xarelto Code Status: FULL code.  ACP documents reviewed Family Communication: None at bedside Disposition Plan: ~/> 2 days Consults called: CArdiology Admission status: Obs tele   Author: Onnie Boer, Cooper 10/25/2023 9:38 PM  For on call review [email or web address].

## 2023-10-25 NOTE — ED Triage Notes (Signed)
 Pt reports he has been having problems with low BP and feeling like he is going to pass out. Pt reports he was recently taken off of metoprolol because of this but it is still happening.

## 2023-10-25 NOTE — Assessment & Plan Note (Signed)
 CPAP nightly

## 2023-10-25 NOTE — Assessment & Plan Note (Signed)
 History of hypertension presenting with hypotension.

## 2023-10-25 NOTE — Assessment & Plan Note (Signed)
 Resume home hydrocodone/acetaminophen 10/325

## 2023-10-25 NOTE — ED Provider Notes (Signed)
 [place] EMERGENCY DEPARTMENT AT [facility] Provider Note   CSN: [id number] Arrival date & time: 10/25/23  1401     History  Chief Complaint  Patient presents with   Hypotension      [name] is a 78 y.o. male who presents to the emergency department with a chief complaint of near syncopal events and low blood pressures.  Patient notes that this has been ongoing for the past few months.  He notes that he was taken off of his metoprolol which he states was a few months ago but notes that he has continued to have ongoing issues with his blood pressure as well as associated near syncopal events.  Patient notes that his bout of near syncope today was the worst that he has experienced.  He notes that he has had some intermittent pain in his chest and shortness of breath.  He denies any associated numbness, paresthesias or unilateral weakness.  He has had no recent falls or blunt head trauma.        Home Medications Prior to Admission medications   Medication Sig Start Date End Date Taking? Authorizing Provider  albuterol (PROVENTIL HFA;VENTOLIN HFA) 108 (90 Base) MCG/ACT inhaler Inhale 1-2 puffs into the lungs every 6 (six) hours as needed for wheezing or shortness of breath. 09/02/18  Yes Hongalgi, Maximino Greenland, MD  alum & mag hydroxide-simeth (MAALOX/MYLANTA) 200-200-20 MG/5ML suspension Take 15 mLs by mouth every 6 (six) hours as needed for indigestion or heartburn.   Yes [provider]  cyclobenzaprine (FLEXERIL) 5 MG tablet Take 5 mg by mouth 3 (three) times daily as needed for muscle spasms. 09/16/23  Yes [provider]  Ensure Max Protein (ENSURE MAX PROTEIN) LIQD Take 330 mLs (11 oz total) by mouth 2 (two) times daily. 12/07/18  Yes Leroy Sea, MD  escitalopram (LEXAPRO) 10 MG tablet Take 1 tablet (10 mg total) by mouth daily. 09/02/18  Yes Hongalgi, Maximino Greenland, MD  ferrous sulfate 325 (65 FE) MG tablet Take 1 tablet (325 mg total) by mouth 3 (three)  times daily with meals. 09/02/18  Yes Hongalgi, Maximino Greenland, MD  furosemide (LASIX) 20 MG tablet Take 20 mg by mouth daily.   Yes [provider]  gabapentin (NEURONTIN) 300 MG capsule Take 1 capsule (300 mg total) by mouth 3 (three) times daily. Patient taking differently: Take 300 mg by mouth See admin instructions. Take 2 capsules every morning and 3 capsules at bedtime 12/07/18  Yes Leroy Sea, MD  HYDROcodone-acetaminophen (NORCO) 10-325 MG tablet Take 0.5-1 tablets by mouth every 6 (six) hours as needed for moderate pain (pain score 4-6). 08/17/23  Yes [provider]  Melatonin 3 MG TABS Take 2 tablets (6 mg total) by mouth at bedtime. Patient taking differently: Take 3 mg by mouth at bedtime. Plus 20 mg in another supplement 09/11/18  Yes Osvaldo Shipper, MD  Multiple Vitamins-Minerals (CENTRUM ADULTS PO) Take 1 tablet by mouth every evening.    Yes [provider]  pantoprazole (PROTONIX) 40 MG tablet Take 1 tablet (40 mg total) by mouth daily. 09/02/18  Yes Hongalgi, Maximino Greenland, MD  promethazine (PHENERGAN) 25 MG tablet Take 25 mg by mouth every 6 (six) hours as needed for nausea or vomiting. 09/22/23  Yes [provider]  Pseudoeph-Doxylamine-DM-APAP (NYQUIL PO) Take 15 mLs by mouth at bedtime as needed (congestion/cough).   Yes [provider]  traZODone (DESYREL) 50 MG tablet Take 1 tablet (50 mg total)  by mouth at bedtime as needed for sleep. 11/26/18  Yes Ghimire, Lyndel Safe, MD  XARELTO 20 MG TABS tablet Take 20 mg by mouth every evening. 09/18/20  Yes [provider]      Allergies    Bee venom and Nickel    Review of Systems   Review of Systems  Constitutional:  Positive for fatigue.  Neurological:        Near syncope    Physical Exam Updated Vital Signs BP 96/68   Pulse (!) 115   Temp 98.3 F (36.8 C) (Oral)   Resp 18   Ht 5\' 8"  (1.727 m)   Wt (!) 145.2 kg   SpO2 96%   BMI 48.66 kg/m  Physical Exam Vitals reviewed. Exam  conducted with a chaperone present.  Constitutional:      Appearance: Normal appearance.  HENT:     Head: Normocephalic and atraumatic.     Nose: Nose normal.     Mouth/Throat:     Mouth: Mucous membranes are moist.  Eyes:     Extraocular Movements: Extraocular movements intact.     Conjunctiva/sclera: Conjunctivae normal.     Pupils: Pupils are equal, round, and reactive to light.  Cardiovascular:     Rate and Rhythm: Normal rate and regular rhythm.     Pulses: Normal pulses.     Heart sounds: Normal heart sounds. No murmur heard.    No friction rub. No gallop.  Pulmonary:     Effort: Pulmonary effort is normal.     Breath sounds: Normal breath sounds.  Abdominal:     General: Abdomen is flat. Bowel sounds are normal. There is no distension.     Palpations: Abdomen is soft.     Tenderness: There is no abdominal tenderness. There is no guarding.  Genitourinary:    Rectum: Guaiac result negative.     Comments: Rectal exam performed with nurse chaperone, no masses noted within the rectal vault, brown stool noted Musculoskeletal:        General: Normal range of motion.     Cervical back: Normal range of motion and neck supple.  Skin:    General: Skin is warm and dry.     Coloration: Skin is not jaundiced.     Findings: No bruising or rash.  Neurological:     General: No focal deficit present.     Mental Status: He is alert and oriented to person, place, and time. Mental status is at baseline.  Psychiatric:        Mood and Affect: Mood normal.        Behavior: Behavior normal.        Thought Content: Thought content normal.        Judgment: Judgment normal.     ED Results / Procedures / Treatments   Labs (all labs ordered are listed, but only abnormal results are displayed) Labs Reviewed  BASIC METABOLIC PANEL - Abnormal; Notable for the following components:      Result Value   Glucose, Bld 102 (*)    All other components within normal limits  CBC - Abnormal; Notable  for the following components:   RBC 4.05 (*)    Hemoglobin 12.9 (*)    MCV 101.5 (*)    All other components within normal limits  BRAIN NATRIURETIC PEPTIDE - Abnormal; Notable for the following components:   B Natriuretic Peptide 148.0 (*)    All other components within normal limits  LACTIC ACID, PLASMA  URINALYSIS, ROUTINE  W REFLEX MICROSCOPIC  LACTIC ACID, PLASMA  CBG MONITORING, ED  TROPONIN I (HIGH SENSITIVITY)  TROPONIN I (HIGH SENSITIVITY)    EKG None  Radiology CT Head Wo Contrast Result Date: 10/25/2023 CLINICAL DATA:  Syncope/presyncope, cerebrovascular cause suspected EXAM: CT HEAD WITHOUT CONTRAST TECHNIQUE: Contiguous axial images were obtained from the base of the skull through the vertex without intravenous contrast. RADIATION DOSE REDUCTION: This exam was performed according to the departmental dose-optimization program which includes automated exposure control, adjustment of the mA and/or kV according to patient size and/or use of iterative reconstruction technique. COMPARISON:  None Available. FINDINGS: Brain: No evidence of acute infarction, hemorrhage, hydrocephalus, extra-axial collection or mass lesion/mass effect. Patchy white matter hypodensities are nonspecific but compatible with chronic microvascular ischemic change. Vascular: No mastoid effusions. Skull: No acute fracture. Sinuses/Orbits: Right maxillary sinus mucosal thickening no acute orbital findings. Other: No mastoid effusions. IMPRESSION: No evidence of acute intracranial abnormality. Electronically Signed   By: Feliberto Harts M.D.   On: 10/25/2023 15:17   DG Chest Port 1 View Result Date: 10/25/2023 CLINICAL DATA:  Syncope, chest pain, hypotension EXAM: PORTABLE CHEST 1 VIEW COMPARISON:  09/25/2020 FINDINGS: 2 frontal views of the chest demonstrate a stable enlarged cardiac silhouette. Continued ectasia and atherosclerosis of the thoracic aorta. There is chronic scarring within the left perihilar  region, with diffuse left-sided pleural calcifications unchanged. No acute airspace disease, effusion, or pneumothorax. No acute bony abnormalities. IMPRESSION: 1. Chronic left pleural calcifications and left perihilar scarring. No acute intrathoracic process. Electronically Signed   By: Sharlet Salina M.D.   On: 10/25/2023 15:07    Procedures Procedures    Medications Ordered in ED Medications  sodium chloride 0.9 % bolus 1,000 mL (0 mLs Intravenous Stopped 10/25/23 1601)  midodrine (PROAMATINE) tablet 10 mg (10 mg Oral Given 10/25/23 1716)    ED Course/ Medical Decision Making/ A&P                                 Medical Decision Making Amount and/or Complexity of Data Reviewed Labs: ordered. Radiology: ordered.  Risk Prescription drug management. Decision regarding hospitalization.   This patient presents to the ED for concern of near syncope, hypotension, this involves an extensive number of treatment options, and is a complaint that carries with it a high risk of complications and morbidity.  The differential diagnosis includes sepsis, CHF, ACS, pulmonary embolus, appendicitis, cholecystitis, small torsion, diverticulitis, CVA, TIA, urinary tract infection, overdose   Co morbidities that complicate the patient evaluation  CHF, atrial fibrillation   Additional history obtained:  Additional history obtained from medical records External records from outside source obtained and reviewed including outside medical records   Lab Tests:  I Ordered, and personally interpreted labs.  The pertinent results include: Elevated BNP, elevated lactate, mild anemia   Imaging Studies ordered:  I ordered imaging studies including CT scan of head and chest x-ray I independently visualized and interpreted imaging which showed no acute intracranial process, no acute cardiopulmonary process I agree with the radiologist interpretation   Cardiac Monitoring: / EKG:  The patient was  maintained on a cardiac monitor.  I personally viewed and interpreted the cardiac monitored which showed an underlying rhythm of: Atrial fibrillation, no ischemic changes, no STEMI   Consultations Obtained:  I requested consultation with the cardiology, hospitalist,  and discussed lab and imaging findings as well as pertinent plan - they recommend: Admission  Problem List / ED Course / Critical interventions / Medication management  Patient does remain stable at this time.  Blood pressure has improved with midodrine and IV fluids in the emergency department.  He does continue to have ongoing tachycardia and orthostasis with standing.  Chest x-rays demonstrated no signs of acute cardiopulmonary process and CT scan of the head demonstrated no acute intracranial hemorrhage.  Do not suspect an underlying infectious process at this point though urine is still pending.  Rectal exam was performed and was Hemoccult negative.  He does have a mild anemia but does appear to be at his baseline.  Patient does state that he is not currently on any known blood pressure medications at this point and notes that he is unsure if he is still taking midodrine though this is not on his medication list.  Did discuss patient case with Dr. Mariea Clonts who has excepted at this time for admission. I ordered medication including IV fluids, midodrine for hypotension Reevaluation of the patient after these medicines showed that the patient improved I have reviewed the patients home medicines and have made adjustments as needed   Social Determinants of Health:  None   Test / Admission - Considered:  Admission        Final Clinical Impression(s) / ED Diagnoses Final diagnoses:  None    Rx / DC Orders ED Discharge Orders     None         Kathlen Mody 10/25/23 1826    Bethann Berkshire, MD 11/01/23 1656

## 2023-10-25 NOTE — Assessment & Plan Note (Addendum)
 Likely due to hypotension.  Reports chronic unchanged poor oral intake.  No GI losses.  Metoprolol was discontinued a few months ago for same.  He is currently on Lasix. Lactic acidosis 1.7>> 2, due to likely from hypotension and poor tissue perfusion.  Low suspicion for infectious etiology.  - Now total of 1.5 L bolus given with improvement in blood pressure, continue N/s 75cc/hr x 12hrs - Midodrine 10 mg given in ED, monitor off midodrine. Restart if hypotensive despite fluids. -Obtain echo -A.m. cortisol - Trend lactic acid

## 2023-10-25 NOTE — Assessment & Plan Note (Signed)
 Stable and compensated.  Last echo 2022 with EF of 50 to 55%. -Hold Lasix 20 mg daily for now with hypotension

## 2023-10-26 ENCOUNTER — Encounter (HOSPITAL_COMMUNITY): Payer: Self-pay | Admitting: Internal Medicine

## 2023-10-26 DIAGNOSIS — I4821 Permanent atrial fibrillation: Secondary | ICD-10-CM | POA: Diagnosis not present

## 2023-10-26 DIAGNOSIS — I951 Orthostatic hypotension: Secondary | ICD-10-CM | POA: Diagnosis not present

## 2023-10-26 DIAGNOSIS — R55 Syncope and collapse: Secondary | ICD-10-CM | POA: Diagnosis not present

## 2023-10-26 DIAGNOSIS — I5032 Chronic diastolic (congestive) heart failure: Secondary | ICD-10-CM | POA: Diagnosis not present

## 2023-10-26 LAB — TSH: TSH: 0.978 u[IU]/mL (ref 0.350–4.500)

## 2023-10-26 LAB — CORTISOL-AM, BLOOD: Cortisol - AM: 5.6 ug/dL — ABNORMAL LOW (ref 6.7–22.6)

## 2023-10-26 MED ORDER — COSYNTROPIN 0.25 MG IJ SOLR
0.2500 mg | Freq: Once | INTRAMUSCULAR | Status: AC
  Administered 2023-10-27: 0.25 mg via INTRAVENOUS
  Filled 2023-10-26: qty 0.25

## 2023-10-26 NOTE — Plan of Care (Signed)
   Problem: Clinical Measurements: Goal: Respiratory complications will improve Outcome: Progressing

## 2023-10-26 NOTE — Progress Notes (Signed)
 Pt c/o severe chronic back pain for most of the night. He was able to ambulate with walker and minimal assistance from bed to doorway to switch out beds. He denied dizziness with the walk. He did not rest well and reports he usually takes 20 mg of melatonin per night. No acute events overnight. Kellogg RN

## 2023-10-26 NOTE — Progress Notes (Signed)
 Transition of Care Department [facility]) has reviewed patient and no other TOC needs have been identified at this time. We will continue to monitor patient advancement through interdisciplinary progression rounds. If new patient transition needs arise, please place a TOC consult.   10/26/23 0746  TOC Brief Assessment  Insurance and Status Reviewed  Patient has primary care physician Yes  Home environment has been reviewed Lives alone.  Prior level of function: Fairly independent. Has someone come in 4 hours a day 5 days a week to assist with household tasks and bathing.  Prior/Current Home Services No current home services  Social Drivers of Health Review SDOH reviewed no interventions necessary  Readmission risk has been reviewed Yes  Transition of care needs no transition of care needs at this time

## 2023-10-26 NOTE — Progress Notes (Signed)
 PROGRESS NOTE  [name], is a 78 y.o. male, DOB - 09-Sep-1945, ZOX:[id number]  Admit date - 10/25/2023   Admitting Physician Onnie Boer, MD  Outpatient Primary MD for the patient is Medicine, [facility]  LOS - 0  Chief Complaint  Patient presents with   Hypotension      Brief Narrative:  78 y.o. male with medical history significant for atrial fibrillation, diastolic CHF, obesity, OSA.  Admitted on 10/24/2022 with near syncope in the setting of chronic A-fib and chronic diastolic dysfunction CHF.   -Assessment and Plan: 1)Near Syncope CT head without acute findings  -likely due to hypotension.  Reports chronic unchanged poor oral intake.  No GI losses.  -Patient was on Lasix PTA -Received IV fluids and midodrine -A.m. cortisol--is low at 5.6 -ACTH stimulation test ordered for 10/27/2023 Echo from [facility] dated 09/14/2023 shows left ventricle size is normal. Wall thickness is normal. Systolic function is low normal. EF: 50-55%. No regional wall motion abnormalities noted. -- Cardiology consult appreciated  2)Chronic atrial fibrillation (HCC) -Hypotension limits ability to use beta-blockers and calcium blockers -Per cardiologist may consider digoxin or amiodarone for rate control -Await official decision from cardiology team -c/n  Xarelto  3)Chronic back pain Resume home hydrocodone/acetaminophen 10/325  4) morbid obesity/OSA on CPAP CPAP nightly -Low calorie diet, portion control and increase physical activity discussed with patient -Body mass index is 50.18 kg/m.  5)Chronic diastolic CHF (congestive heart failure) (HCC) Stable and compensated.  Last echo 2022 with EF of 50 to 55%. -Hold PTA Lasix due to dehydration and hypotension concerns  6)Mild chronic Anemia--- hemoglobin currently close to baseline (above 12) -MCV elevated -Anemia workup pending -No obvious bleeding  7)GERD--stable, continue Protonix  Disposition: The  patient is from: Home              Anticipated d/c is to: Home              Anticipated d/c date is: 1 day              Patient currently is not medically stable to d/c. Barriers: Not Clinically Stable-   Code Status :  -  Code Status: Full Code   Family Communication:   NA (patient is alert, awake and coherent)   DVT Prophylaxis  :   - SCDs    rivaroxaban (XARELTO) tablet 20 mg   Lab Results  Component Value Date   PLT 181 10/25/2023   Inpatient Medications  Scheduled Meds:  [START ON 10/27/2023] cosyntropin  0.25 mg Intravenous Once   escitalopram  10 mg Oral Daily   gabapentin  600 mg Oral Daily   And   gabapentin  300 mg Oral QHS   melatonin  6 mg Oral QHS   pantoprazole  40 mg Oral Daily   rivaroxaban  20 mg Oral QPM   Continuous Infusions: PRN Meds:.acetaminophen **OR** acetaminophen, HYDROcodone-acetaminophen, ondansetron **OR** ondansetron (ZOFRAN) IV, polyethylene glycol, traZODone   Anti-infectives (From admission, onward)    None       Subjective: [name] today has no fevers, no emesis,  No chest pain,   - Fatigue and generalized weakness persist -  Objective: Vitals:   10/26/23 0310 10/26/23 0312 10/26/23 0416 10/26/23 0733  BP:   111/76 129/84  Pulse: (!) 109     Resp: 17 14  20   Temp:   (!) 97 F (36.1 C)   TempSrc:   Axillary   SpO2: 99% 98%  Weight:      Height:        Intake/Output Summary (Last 24 hours) at 10/26/2023 1649 Last data filed at 10/26/2023 1459 Gross per 24 hour  Intake 1700 ml  Output 1300 ml  Net 400 ml   Filed Weights   10/25/23 1426 10/25/23 2013  Weight: (!) 145.2 kg (!) 149.7 kg    Physical Exam  Gen:- Awake Alert,  in no apparent distress  HEENT:- .AT, No sclera icterus Neck-Supple Neck,No JVD,.  Lungs-  CTAB , fair symmetrical air movement CV- S1, S2 normal, irregularly irregular Abd-  +ve B.Sounds, Abd Soft, No tenderness,    Extremity/Skin:- No  edema, pedal pulses present  Psych-affect is  appropriate, oriented x3 Neuro-generalized weakness ,no new focal deficits, no tremors  Data Reviewed: I have personally reviewed following labs and imaging studies  CBC: Recent Labs  Lab 10/25/23 1541  WBC 5.6  HGB 12.9*  HCT 41.1  MCV 101.5*  PLT 181   Basic Metabolic Panel: Recent Labs  Lab 10/25/23 1541  NA 139  K 4.5  CL 102  CO2 27  GLUCOSE 102*  BUN 21  CREATININE 1.07  CALCIUM 9.3   GFR: Estimated Creatinine Clearance: 82.5 mL/min (by C-G formula based on SCr of 1.07 mg/dL).  Radiology Studies: CT Head Wo Contrast Result Date: 10/25/2023 CLINICAL DATA:  Syncope/presyncope, cerebrovascular cause suspected EXAM: CT HEAD WITHOUT CONTRAST TECHNIQUE: Contiguous axial images were obtained from the base of the skull through the vertex without intravenous contrast. RADIATION DOSE REDUCTION: This exam was performed according to the departmental dose-optimization program which includes automated exposure control, adjustment of the mA and/or kV according to patient size and/or use of iterative reconstruction technique. COMPARISON:  None Available. FINDINGS: Brain: No evidence of acute infarction, hemorrhage, hydrocephalus, extra-axial collection or mass lesion/mass effect. Patchy white matter hypodensities are nonspecific but compatible with chronic microvascular ischemic change. Vascular: No mastoid effusions. Skull: No acute fracture. Sinuses/Orbits: Right maxillary sinus mucosal thickening no acute orbital findings. Other: No mastoid effusions. IMPRESSION: No evidence of acute intracranial abnormality. Electronically Signed   By: Feliberto Harts M.D.   On: 10/25/2023 15:17   DG Chest Port 1 View Result Date: 10/25/2023 CLINICAL DATA:  Syncope, chest pain, hypotension EXAM: PORTABLE CHEST 1 VIEW COMPARISON:  09/25/2020 FINDINGS: 2 frontal views of the chest demonstrate a stable enlarged cardiac silhouette. Continued ectasia and atherosclerosis of the thoracic aorta. There is  chronic scarring within the left perihilar region, with diffuse left-sided pleural calcifications unchanged. No acute airspace disease, effusion, or pneumothorax. No acute bony abnormalities. IMPRESSION: 1. Chronic left pleural calcifications and left perihilar scarring. No acute intrathoracic process. Electronically Signed   By: Sharlet Salina M.D.   On: 10/25/2023 15:07   Scheduled Meds:  [START ON 10/27/2023] cosyntropin  0.25 mg Intravenous Once   escitalopram  10 mg Oral Daily   gabapentin  600 mg Oral Daily   And   gabapentin  300 mg Oral QHS   melatonin  6 mg Oral QHS   pantoprazole  40 mg Oral Daily   rivaroxaban  20 mg Oral QPM   Continuous Infusions:   LOS: 0 days   Shon Hale M.D on 10/26/2023 at 4:49 PM  Go to [email or web address] - for contact info  [facility] - Office  [phone]  If 7PM-7AM, please contact night-coverage [email or web address] 10/26/2023, 4:49 PM

## 2023-10-26 NOTE — Plan of Care (Signed)
  Problem: Education: Goal: Knowledge of General Education information will improve Description: Including pain rating scale, medication(s)/side effects and non-pharmacologic comfort measures Outcome: Progressing   Problem: Health Behavior/Discharge Planning: Goal: Ability to manage health-related needs will improve Outcome: Not Progressing   Problem: Clinical Measurements: Goal: Ability to maintain clinical measurements within normal limits will improve Outcome: Progressing   Problem: Activity: Goal: Risk for activity intolerance will decrease Outcome: Progressing

## 2023-10-26 NOTE — Care Management Obs Status (Signed)
 MEDICARE OBSERVATION STATUS NOTIFICATION   Patient Details  Name: [name] MRN: [id number] Date of Birth: Mar 03, 1946   Medicare Observation Status Notification Given:  Yes    Corey Harold 10/26/2023, 8:52 AM

## 2023-10-26 NOTE — Plan of Care (Signed)
   Problem: Education: Goal: Knowledge of General Education information will improve Description: Including pain rating scale, medication(s)/side effects and non-pharmacologic comfort measures Outcome: Progressing   Problem: Health Behavior/Discharge Planning: Goal: Ability to manage health-related needs will improve Outcome: Progressing   Problem: Activity: Goal: Risk for activity intolerance will decrease Outcome: Progressing

## 2023-10-26 NOTE — Consult Note (Addendum)
 Cardiology Consultation   Patient ID: [name] MRN: [id number]; DOB: August 25, 1946  Admit date: 10/25/2023 Date of Consult: 10/26/2023  PCP:  Medicine, [facility]   [facility] Providers Cardiologist:  Dr. Boneta Lucks with [facility]      Patient Profile:   [name] is a 78 y.o. male with a hx of chronic HFpEF (?transient low EF 2022 then improved), OSA compliant with CPAP, chronic atrial fibrillation, super morbid obesity, giant cell arteritis, arthritis, minimal plaque by carotid duplex 2022, aortic atherosclerosis, chronic dyspnea who is being seen 10/26/2023 for the evaluation of near syncope at the request of Dr. Carolynn Serve.  History of Present Illness:   [name] follows with [facility]. History is obtained via chart review in our system and CareEverywhere. Remote stress test 2015 reported diaphragmatic soft tissue attenuation without ischemia. He is known to have chronic atrial fibrillation managed with rate control strategy in setting of severe obesity. Though [facility] he had an echo in 05/2021 showing EF 35-40%, no ischemia workup pursued that I can see. Subsequent echoes showed improved LV function. He had issues with GI distress with Jardiance. Last echo via [facility] 09/2023 showed EF 50-55%, mildly dilated LA, mild TR, challenging study due to body habitus. GDMT otherwise limited by orthostatic hypotension requiring midodrine. Last OV with cardiology 09/16/23, BP 90/68, HR 86, no med changes felt required at that time. He was listed on Toprol 200mg  daily at that time.  He presented to [facility] with near syncope and low BP ongoing for the last several months that has persistent despite med adjustment  He had reported being taken off metoprolol due to this a few weeks ago. He is unsure whether he is still on midodrine, gets his meds through a pill pack, states this information is coming later (I placed consult to pharmacy to clarify if he is on this). He had  had poor oral intake lately. Reports chronic unchanged DOE with any activity. He reports some vague awareness of intermittent chest pressure but no chest pain. Initial BPs 90s systolic on admission, variable from 63/47 to 133/96. He remains in AF with HR 1teens this AM. Labs show neg troponin x2, BNP 148, Hgb 12.9, AM cortisol low at 5.6. CXR shows chronic left pleural calcifications and left perihilar scarring and no acute intrathoracic process. CT head shows no acute intracranial abormality. Home cardiac regimen otherwise includes Lasix 20mg  daily which has been held. He received 10mg  midodrine last night as well as IVF. He states he is at wit's end feeling so bad. He also struggles with neuropathy and difficulty sleeping.   He reports some blood in stool at times but thinks this is related to a tear/fissure from straining.  Addendum: per pharmD, no fill for midodrine in over a year.  Past Medical History:  Diagnosis Date   Arthritis    knees,feet,shoulders,elbows.hands   CHF (congestive heart failure) (HCC)    Chronic atrial fibrillation (HCC)    Constipation    Dyspnea    with exertion    Dysrhythmia    Atrial Flutter- 2006- corredted itself   Family history of adverse reaction to anesthesia    mother- with novocaine went into shock   GERD (gastroesophageal reflux disease)    Headache    Hemorrhoids    History of blood transfusion    Hypotension    Insomnia    Sleep apnea    cpap   Spondylosis of cervical region without myelopathy or radiculopathy 04/30/2017  Formatting of this note might be different from the original. Added automatically from request for surgery [id number]   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    Past Surgical History:  Procedure Laterality Date   APPENDECTOMY     ARTERY BIOPSY Left 12/30/2017   Procedure: BIOPSY TEMPORAL ARTERY;  Surgeon: Claud Kelp, MD;  Location: [facility];  Service: General;  Laterality: Left;   CHOLECYSTECTOMY     COLONOSCOPY  W/ POLYPECTOMY     ESOPHAGOGASTRODUODENOSCOPY (EGD) WITH PROPOFOL  07/06/2012   Procedure: ESOPHAGOGASTRODUODENOSCOPY (EGD) WITH PROPOFOL;  Surgeon: Petra Kuba, MD;  Location: [facility];  Service: Endoscopy;  Laterality: N/A;   ESOPHAGOSCOPY     EYE SURGERY     left eye- muscle repair   HERNIA REPAIR     ventral hernia   INCISION AND DRAINAGE HIP Left 03/17/2018   Procedure: IRRIGATION AND DEBRIDEMENT LEFT THIGH WOUND;  Surgeon: Ollen Gross, MD;  Location: [facility];  Service: Orthopedics;  Laterality: Left;   JOINT REPLACEMENT     bilateral hips.  Right broke and had to be re placed again   MASS EXCISION N/A 03/02/2015   Procedure: EXCISION ABDOMINAL WALL MASS;  Surgeon: Ovidio Kin, MD;  Location: [facility] OR;  Service: General;  Laterality: N/A;   TOTAL HIP REVISION Left 02/17/2018   Procedure: Left total hip arthroplasty revision;  Surgeon: Ollen Gross, MD;  Location: [facility];  Service: Orthopedics;  Laterality: Left;   VERTICAL BANDED GASTROPLASTY       Home Medications:  Prior to Admission medications   Medication Sig Start Date End Date Taking? Authorizing Provider  albuterol (PROVENTIL HFA;VENTOLIN HFA) 108 (90 Base) MCG/ACT inhaler Inhale 1-2 puffs into the lungs every 6 (six) hours as needed for wheezing or shortness of breath. 09/02/18  Yes Hongalgi, Maximino Greenland, MD  alum & mag hydroxide-simeth (MAALOX/MYLANTA) 200-200-20 MG/5ML suspension Take 15 mLs by mouth every 6 (six) hours as needed for indigestion or heartburn.   Yes [provider]  cyclobenzaprine (FLEXERIL) 5 MG tablet Take 5 mg by mouth 3 (three) times daily as needed for muscle spasms. 09/16/23  Yes [provider]  Ensure Max Protein (ENSURE MAX PROTEIN) LIQD Take 330 mLs (11 oz total) by mouth 2 (two) times daily. 12/07/18  Yes Leroy Sea, MD  escitalopram (LEXAPRO) 10 MG tablet Take 1 tablet (10 mg total) by mouth daily. 09/02/18  Yes Hongalgi, Maximino Greenland, MD  ferrous sulfate 325 (65 FE) MG tablet Take 1  tablet (325 mg total) by mouth 3 (three) times daily with meals. 09/02/18  Yes Hongalgi, Maximino Greenland, MD  furosemide (LASIX) 20 MG tablet Take 20 mg by mouth daily.   Yes [provider]  gabapentin (NEURONTIN) 300 MG capsule Take 1 capsule (300 mg total) by mouth 3 (three) times daily. Patient taking differently: Take 300 mg by mouth See admin instructions. Take 2 capsules every morning and 3 capsules at bedtime 12/07/18  Yes Leroy Sea, MD  HYDROcodone-acetaminophen (NORCO) 10-325 MG tablet Take 0.5-1 tablets by mouth every 6 (six) hours as needed for moderate pain (pain score 4-6). 08/17/23  Yes [provider]  Melatonin 3 MG TABS Take 2 tablets (6 mg total) by mouth at bedtime. Patient taking differently: Take 3 mg by mouth at bedtime. Plus 20 mg in another supplement 09/11/18  Yes Osvaldo Shipper, MD  Multiple Vitamins-Minerals (CENTRUM ADULTS PO) Take 1 tablet by mouth every evening.    Yes [provider]  pantoprazole (PROTONIX)  40 MG tablet Take 1 tablet (40 mg total) by mouth daily. 09/02/18  Yes Hongalgi, Maximino Greenland, MD  promethazine (PHENERGAN) 25 MG tablet Take 25 mg by mouth every 6 (six) hours as needed for nausea or vomiting. 09/22/23  Yes [provider]  Pseudoeph-Doxylamine-DM-APAP (NYQUIL PO) Take 15 mLs by mouth at bedtime as needed (congestion/cough).   Yes [provider]  traZODone (DESYREL) 50 MG tablet Take 1 tablet (50 mg total) by mouth at bedtime as needed for sleep. 11/26/18  Yes Ghimire, Lyndel Safe, MD  XARELTO 20 MG TABS tablet Take 20 mg by mouth every evening. 09/18/20  Yes [provider]    Inpatient Medications: Scheduled Meds:  escitalopram  10 mg Oral Daily   gabapentin  600 mg Oral Daily   And   gabapentin  300 mg Oral QHS   melatonin  6 mg Oral QHS   pantoprazole  40 mg Oral Daily   rivaroxaban  20 mg Oral QPM   Continuous Infusions:  PRN Meds: acetaminophen **OR** acetaminophen, HYDROcodone-acetaminophen,  ondansetron **OR** ondansetron (ZOFRAN) IV, polyethylene glycol, traZODone  Allergies:    Allergies  Allergen Reactions   Bee Venom Anaphylaxis and Other (See Comments)    Noted from [employer]. honey bee venom   Nickel Rash and Other (See Comments)    "Breaks me out"    Social History:   Social History   Socioeconomic History   Marital status: Divorced    Spouse name: Not on file   Number of children: Not on file   Years of education: Not on file   Highest education level: Not on file  Occupational History   Not on file  Tobacco Use   Smoking status: Former    Current packs/day: 0.00    Average packs/day: 2.0 packs/day for 17.8 years (35.6 ttl pk-yrs)    Types: Cigarettes    Start date: 05/16/1966    Quit date: 03/11/1984    Years since quitting: 39.6   Smokeless tobacco: Never  Vaping Use   Vaping status: Never Used  Substance and Sexual Activity   Alcohol use: Not Currently    Comment: occasionally a couple of times a year    Drug use: No   Sexual activity: Not on file  Other Topics Concern   Not on file  Social History Narrative   Not on file   Social Drivers of Health   Financial Resource Strain: Low Risk  (09/15/2023)   Received [facility]   Overall Financial Resource Strain (CARDIA)    Difficulty of Paying Living Expenses: Not hard at all  Food Insecurity: No Food Insecurity (10/25/2023)   Hunger Vital Sign    Worried About Running Out of Food in the Last Year: Never true    Ran Out of Food in the Last Year: Never true  Transportation Needs: No Transportation Needs (10/25/2023)   PRAPARE - Administrator, Civil Service (Medical): No    Lack of Transportation (Non-Medical): No  Physical Activity: Sufficiently Active (01/11/2023)   Received from [facility]   Exercise Vital Sign    Days of Exercise per Week: 7 days    Minutes of Exercise per Session: 60 min  Stress: No Stress Concern Present (10/08/2023)   Received from [facility] of Occupational Health - Occupational Stress Questionnaire    Feeling of Stress : Not at all  Social Connections: Unknown (10/25/2023)   Social Connection and Isolation Panel [NHANES]  Frequency of Communication with Friends and Family: More than three times a week    Frequency of Social Gatherings with Friends and Family: More than three times a week    Attends Religious Services: Not on file    Active Member of Clubs or Organizations: Not on file    Attends Banker Meetings: Not on file    Marital Status: Not on file  Intimate Partner Violence: Not At Risk (10/25/2023)   Humiliation, Afraid, Rape, and Kick questionnaire    Fear of Current or Ex-Partner: No    Emotionally Abused: No    Physically Abused: No    Sexually Abused: No    Family History:   Family History  Problem Relation Age of Onset   Cancer Mother    Alcohol abuse Father      ROS:  Please see the history of present illness.  All other ROS reviewed and negative.     Physical Exam/Data:   Vitals:   10/26/23 0310 10/26/23 0312 10/26/23 0416 10/26/23 0733  BP:   111/76 129/84  Pulse: (!) 109     Resp: 17 14  20   Temp:   (!) 97 F (36.1 C)   TempSrc:   Axillary   SpO2: 99% 98%    Weight:      Height:        Intake/Output Summary (Last 24 hours) at 10/26/2023 0947 Last data filed at 10/26/2023 0915 Gross per 24 hour  Intake 1220 ml  Output 850 ml  Net 370 ml      10/25/2023    8:13 PM 10/25/2023    2:26 PM 09/28/2020    4:35 AM  Last 3 Weights  Weight (lbs) 330 lb 0.5 oz 320 lb 304 lb 7.3 oz  Weight (kg) 149.7 kg 145.151 kg 138.1 kg     Body mass index is 50.18 kg/m.  General: Pale elderly WM in no acute distress. Head: Normocephalic, atraumatic, sclera non-icteric, no xanthomas, nares are without discharge. Neck: Negative for carotid bruits. JVP not elevated. Lungs: DIstant but clear bilaterally to auscultation without wheezes, rales, or rhonchi. Breathing is  unlabored. Heart: Irregularly irregular, mildly tachycardic, S1 S2 without murmurs, rubs, or gallops.  Abdomen: Soft, non-tender, non-distended with normoactive bowel sounds. No rebound/guarding. Extremities: No clubbing or cyanosis. No edema. Distal pedal pulses are 2+ and equal bilaterally. Neuro: Alert and oriented X 3. Moves all extremities spontaneously. Psych:  Responds to questions appropriately with a normal affect.   EKG:  The EKG was personally reviewed and demonstrates:  Atrial fibrillation 108bpm, low voltage, nonspecific STTW changes. AM tracing AF 99bpm, one PVC, low voltage, nonspecific STTW changes. Telemetry:  Telemetry was personally reviewed and demonstrates:  AF HR 100-1teens  Relevant CV Studies: Echo 10/01/23 Impression  Left Ventricle: Systolic function is low normal. EF: 50-55%.   Left Ventricle: Wall thickness is normal.   Left Ventricle: Left ventricle size is normal.   Left Ventricle: No regional wall motion abnormalities noted.   Left Atrium: Left atrium is mildly dilated at 4.700 cm.   Tricuspid Valve: There is mild regurgitation. Narrative  This result has an attachment that is not available.  Left Ventricle Left ventricle size is normal. Wall thickness is normal. Systolic function is low normal. EF: 50-55%. No regional wall motion abnormalities noted.  Right Ventricle Right ventricle size is normal.  Left Atrium Left atrium is mildly dilated at 4.700 cm.  Right Atrium Right atrium size is normal.  IVC/SVC The inferior vena  cava demonstrates a diameter of <=2.1 cm and collapses >50%; therefore, the right atrial pressure is estimated at 3 mmHg.  Mitral Valve Mitral valve structure is normal. There is no mitral regurgitation.  Tricuspid Valve Tricuspid valve structure is normal. There is mild regurgitation. The right ventricular systolic pressure is normal (<36 mmHg).  Aortic Valve The leaflets are mildly thickened and exhibit probably normal  excursion. There is no regurgitation or stenosis.  Pulmonic Valve The pulmonic valve was not well visualized.  Ascending Aorta The aortic root is normal in size.  Pericardium There is no pericardial effusion.  Study Details A limited echo was performed using limited 2D, color flow Doppler and limited spectral Doppler. Overall the study quality was suboptimal. The study was technically difficult. The study was difficult due to patient's body habitus. The imaging is on file and stored in a permanent location.  Laboratory Data:  High Sensitivity Troponin:   Recent Labs  Lab 10/25/23 1541 10/25/23 1734  TROPONINIHS 5 7     Chemistry Recent Labs  Lab 10/25/23 1541  NA 139  K 4.5  CL 102  CO2 27  GLUCOSE 102*  BUN 21  CREATININE 1.07  CALCIUM 9.3  GFRNONAA >60  ANIONGAP 10    No results for input(s): "PROT", "ALBUMIN", "AST", "ALT", "ALKPHOS", "BILITOT" in the last 168 hours. Lipids No results for input(s): "CHOL", "TRIG", "HDL", "LABVLDL", "LDLCALC", "CHOLHDL" in the last 168 hours.  Hematology Recent Labs  Lab 10/25/23 1541  WBC 5.6  RBC 4.05*  HGB 12.9*  HCT 41.1  MCV 101.5*  MCH 31.9  MCHC 31.4  RDW 14.0  PLT 181   Thyroid No results for input(s): "TSH", "FREET4" in the last 168 hours.  BNP Recent Labs  Lab 10/25/23 1541  BNP 148.0*    DDimer No results for input(s): "DDIMER" in the last 168 hours.   Radiology/Studies:  CT Head Wo Contrast Result Date: 10/25/2023 CLINICAL DATA:  Syncope/presyncope, cerebrovascular cause suspected EXAM: CT HEAD WITHOUT CONTRAST TECHNIQUE: Contiguous axial images were obtained from the base of the skull through the vertex without intravenous contrast. RADIATION DOSE REDUCTION: This exam was performed according to the departmental dose-optimization program which includes automated exposure control, adjustment of the mA and/or kV according to patient size and/or use of iterative reconstruction technique. COMPARISON:  None  Available. FINDINGS: Brain: No evidence of acute infarction, hemorrhage, hydrocephalus, extra-axial collection or mass lesion/mass effect. Patchy white matter hypodensities are nonspecific but compatible with chronic microvascular ischemic change. Vascular: No mastoid effusions. Skull: No acute fracture. Sinuses/Orbits: Right maxillary sinus mucosal thickening no acute orbital findings. Other: No mastoid effusions. IMPRESSION: No evidence of acute intracranial abnormality. Electronically Signed   By: Feliberto Harts M.D.   On: 10/25/2023 15:17   DG Chest Port 1 View Result Date: 10/25/2023 CLINICAL DATA:  Syncope, chest pain, hypotension EXAM: PORTABLE CHEST 1 VIEW COMPARISON:  09/25/2020 FINDINGS: 2 frontal views of the chest demonstrate a stable enlarged cardiac silhouette. Continued ectasia and atherosclerosis of the thoracic aorta. There is chronic scarring within the left perihilar region, with diffuse left-sided pleural calcifications unchanged. No acute airspace disease, effusion, or pneumothorax. No acute bony abnormalities. IMPRESSION: 1. Chronic left pleural calcifications and left perihilar scarring. No acute intrathoracic process. Electronically Signed   By: Sharlet Salina M.D.   On: 10/25/2023 15:07     Assessment and Plan:   1. Orthostatic hypotension - placed pharm consult to request clarification whether he is on midodrine at home. If not, anticipate  reintroduction but if so, would benefit from dose increase. Addendum: per pharmD, no fill for midodrine in over a year. Will discuss dose plan with MD - ? Adrenal insufficiency workup per primary team -> AM cortisol level low - given constellation of AF, low voltage, neuropathy, query whether underlying process here, question whether he would benefit from outpatient cMRI with primary cardiology team to exclude infiltrative process  2. Permanent atrial fibrillation, here with RVR, mild chest pressure without chest pain - previously on  Toprol 200mg  daily per records, not surprising to see HR 1teens off this - unfortunately BP will not support aggressive reintroduction; will discuss lower dose vs consideration of digoxin vs amiodarone (for rate control purposes) with MD - troponins are negative, follow with HR management - remains on Xarelto  3. Chronic HFpEF - GDMT limited by orthostasis - agree with holding Lasix for now - volume status challenging with obesity but does not appear overtly volume up - intolerant to SGLT2i due to h/o GI upset with this  4. Mild anemia (stable), episodic BRBPR which patient feels is from straining - hemoccult stool, will need OP GI w/u - anemia panel in AM - further per primary  5. Morbid obesity, OSA on CPAP - continue CPAP at bedtime - would check exertional O2 sat prior to DC to ensure no component of OHS  6. Difficulty sleeping, neuropathy - nurse notes indicate patient takes melatonin 20mg  at home, this seems quite high, not sure if there are any dose dependent issues with BP with this - per primary   Risk Assessment/Risk Scores:        New York Heart Association (NYHA) Functional Class NYHA Class III  CHA2DS2-VASc Score = 4   This indicates a 4.8% annual risk of stroke. The patient's score is based upon: CHF History: 1 HTN History: 0 Diabetes History: 0 Stroke History: 0 Vascular Disease History: 1 (aortic plaque) Age Score: 2 Gender Score: 0       For questions or updates, please contact [facility] Please consult [email or web address] for contact info under    Signed, Laurann Montana, PA-C  10/26/2023 9:47 AM  Patient seen and examined   I agree with findings as noted by D Dunn above    Pt is a 78 yo who is normallly followed at [facility] for cardiology, last seen this year. Hx of HFrEF (echo in past LVEF 35 to 40%; in Jan 2025 LVEF 50 to 55%) atrial fibrillation, OSA (on CPAP), PAD, morbid obesity Note that pt had been on midodrine in the past    Pt  admitted on 10/26/23 with presyncope     Here his BP has been labile (60s to 130s) with elevated HR with standing      Since admit he has received IVF and midodrine x 1    Labs signif for BUN/CR 21/1.07    BNP 148    Cortisol mildly decreaseed at 5.6      On exam:   pt appears comfortable in bed Neck is full  Unable to assess JVP  Lungs are CTA  Cardiac Irreg irreg   No S3  no murmurs ABd is obese     Ext with tr edema   2+ pulses   Impression:    Dizziness/hypotension   BP is very labile    He responded some to IVF and 1 dose of midodrine       Note he is to have adrenal stim test with low cortisol  REcomm: Continue to follow   Recheck orthostatics in AM Hold diuretics, He is off of b blocker   Follow BP and HR    May need digoxin for HR control, possible amiodarone   On my questioning the pt denied numbness    I am not convinced of signficant peripheral neuropathy     Dietrich Pates MD

## 2023-10-27 DIAGNOSIS — I951 Orthostatic hypotension: Secondary | ICD-10-CM | POA: Diagnosis not present

## 2023-10-27 DIAGNOSIS — R55 Syncope and collapse: Secondary | ICD-10-CM | POA: Diagnosis not present

## 2023-10-27 DIAGNOSIS — I4821 Permanent atrial fibrillation: Secondary | ICD-10-CM | POA: Diagnosis not present

## 2023-10-27 LAB — IRON AND TIBC
Iron: 60 ug/dL (ref 45–182)
Saturation Ratios: 33 % (ref 17.9–39.5)
TIBC: 180 ug/dL — ABNORMAL LOW (ref 250–450)
UIBC: 120 ug/dL

## 2023-10-27 LAB — RETICULOCYTES
Immature Retic Fract: 12.2 % (ref 2.3–15.9)
RBC.: 3.7 MIL/uL — ABNORMAL LOW (ref 4.22–5.81)
Retic Count, Absolute: 31.1 10*3/uL (ref 19.0–186.0)
Retic Ct Pct: 0.8 % (ref 0.4–3.1)

## 2023-10-27 LAB — CBC
HCT: 38.6 % — ABNORMAL LOW (ref 39.0–52.0)
Hemoglobin: 11.8 g/dL — ABNORMAL LOW (ref 13.0–17.0)
MCH: 31.1 pg (ref 26.0–34.0)
MCHC: 30.6 g/dL (ref 30.0–36.0)
MCV: 101.8 fL — ABNORMAL HIGH (ref 80.0–100.0)
Platelets: 172 10*3/uL (ref 150–400)
RBC: 3.79 MIL/uL — ABNORMAL LOW (ref 4.22–5.81)
RDW: 14.2 % (ref 11.5–15.5)
WBC: 4.5 10*3/uL (ref 4.0–10.5)
nRBC: 0 % (ref 0.0–0.2)

## 2023-10-27 LAB — FOLATE: Folate: 19 ng/mL (ref >5.9)

## 2023-10-27 LAB — BASIC METABOLIC PANEL
Anion gap: 7 (ref 5–15)
BUN: 19 mg/dL (ref 8–23)
CO2: 24 mmol/L (ref 22–32)
Calcium: 9 mg/dL (ref 8.9–10.3)
Chloride: 106 mmol/L (ref 98–111)
Creatinine, Ser: 1.15 mg/dL (ref 0.61–1.24)
GFR, Estimated: >60 mL/min (ref >60)
Glucose, Bld: 91 mg/dL (ref 70–99)
Potassium: 4.6 mmol/L (ref 3.5–5.1)
Sodium: 137 mmol/L (ref 135–145)

## 2023-10-27 LAB — ACTH STIMULATION, 3 TIME POINTS
Cortisol, 30 Min: 18.8 ug/dL
Cortisol, 60 Min: 23.8 ug/dL
Cortisol, Base: 7.5 ug/dL

## 2023-10-27 LAB — FERRITIN: Ferritin: 487 ng/mL — ABNORMAL HIGH (ref 24–336)

## 2023-10-27 LAB — VITAMIN B12: Vitamin B-12: 416 pg/mL (ref 180–914)

## 2023-10-27 MED ORDER — DIGOXIN 125 MCG PO TABS
0.2500 mg | ORAL_TABLET | Freq: Every day | ORAL | Status: DC
  Administered 2023-10-27 – 2023-10-31 (×5): 0.25 mg via ORAL
  Filled 2023-10-27 (×5): qty 2

## 2023-10-27 MED ORDER — MIDODRINE HCL 5 MG PO TABS
10.0000 mg | ORAL_TABLET | Freq: Three times a day (TID) | ORAL | Status: DC
  Administered 2023-10-27: 10 mg via ORAL
  Filled 2023-10-27: qty 2

## 2023-10-27 MED ORDER — MIDODRINE HCL 5 MG PO TABS: 5.0000 mg | ORAL_TABLET | Freq: Three times a day (TID) | ORAL | Status: DC

## 2023-10-27 MED ORDER — MIDODRINE HCL 5 MG PO TABS
5.0000 mg | ORAL_TABLET | Freq: Three times a day (TID) | ORAL | Status: DC
  Administered 2023-10-27 – 2023-10-28 (×2): 5 mg via ORAL
  Filled 2023-10-27 (×3): qty 1

## 2023-10-27 MED ORDER — DIGOXIN 0.25 MG/ML IJ SOLN
0.5000 mg | Freq: Once | INTRAMUSCULAR | Status: AC
  Administered 2023-10-27: 0.5 mg via INTRAVENOUS
  Filled 2023-10-27: qty 2

## 2023-10-27 NOTE — Progress Notes (Addendum)
 Progress Note  Patient Name: [name] Date of Encounter: 10/27/2023  Primary Cardiologist: Prentice Docker, MD (Inactive)  Subjective   Still having episodes of near syncope particularly with position changes - HR goes up with standing, BP remains soft. No CP this AM. ACTH stim test negative for adrenal insufficiency (both values >18).  Inpatient Medications    Scheduled Meds:  digoxin  0.5 mg Intravenous Once   escitalopram  10 mg Oral Daily   gabapentin  600 mg Oral Daily   And   gabapentin  300 mg Oral QHS   melatonin  6 mg Oral QHS   midodrine  5 mg Oral TID WC   pantoprazole  40 mg Oral Daily   rivaroxaban  20 mg Oral QPM   Continuous Infusions:  PRN Meds: acetaminophen **OR** acetaminophen, HYDROcodone-acetaminophen, ondansetron **OR** ondansetron (ZOFRAN) IV, polyethylene glycol, traZODone   Vital Signs    Vitals:   10/26/23 2334 10/27/23 0341 10/27/23 0400 10/27/23 0839  BP: 101/61 119/70    Pulse:  94    Resp:   10   Temp:  98.8 F (37.1 C)  98.8 F (37.1 C)  TempSrc:  Axillary  Axillary  SpO2:  93%    Weight:      Height:        Intake/Output Summary (Last 24 hours) at 10/27/2023 1129 Last data filed at 10/27/2023 1055 Gross per 24 hour  Intake 480 ml  Output 2100 ml  Net -1620 ml      10/25/2023    8:13 PM 10/25/2023    2:26 PM 09/28/2020    4:35 AM  Last 3 Weights  Weight (lbs) 330 lb 0.5 oz 320 lb 304 lb 7.3 oz  Weight (kg) 149.7 kg 145.151 kg 138.1 kg     Telemetry    AF rates 90s-150s - Personally Reviewed  Physical Exam   General: Pale elderly WM in no acute distress. Head: Normocephalic, atraumatic, sclera non-icteric, no xanthomas, nares are without discharge. Neck: Negative for carotid bruits. JVP not elevated. Lungs: DIstant but clear bilaterally to auscultation without wheezes, rales, or rhonchi. Breathing is unlabored. Heart: Irregularly irregular, mildly tachycardic, S1 S2 without murmurs, rubs, or gallops.  Abdomen:  Soft, non-tender, non-distended with normoactive bowel sounds. No rebound/guarding. Extremities: No clubbing or cyanosis. No edema. Distal pedal pulses are 2+ and equal bilaterally. Neuro: Alert and oriented X 3. Moves all extremities spontaneously. Psych:  Responds to questions appropriately with a normal affect.  Labs    High Sensitivity Troponin:   Recent Labs  Lab 10/25/23 1541 10/25/23 1734  TROPONINIHS 5 7      Cardiac EnzymesNo results for input(s): "TROPONINI" in the last 168 hours. No results for input(s): "TROPIPOC" in the last 168 hours.   Chemistry Recent Labs  Lab 10/25/23 1541 10/27/23 0504  NA 139 137  K 4.5 4.6  CL 102 106  CO2 27 24  GLUCOSE 102* 91  BUN 21 19  CREATININE 1.07 1.15  CALCIUM 9.3 9.0  GFRNONAA >60 >60  ANIONGAP 10 7     Hematology Recent Labs  Lab 10/25/23 1541 10/27/23 0504  WBC 5.6 4.5  RBC 4.05* 3.79*  3.70*  HGB 12.9* 11.8*  HCT 41.1 38.6*  MCV 101.5* 101.8*  MCH 31.9 31.1  MCHC 31.4 30.6  RDW 14.0 14.2  PLT 181 172    BNP Recent Labs  Lab 10/25/23 1541  BNP 148.0*     DDimer No results for input(s): "DDIMER" in the last  168 hours.   Radiology    CT Head Wo Contrast Result Date: 10/25/2023 CLINICAL DATA:  Syncope/presyncope, cerebrovascular cause suspected EXAM: CT HEAD WITHOUT CONTRAST TECHNIQUE: Contiguous axial images were obtained from the base of the skull through the vertex without intravenous contrast. RADIATION DOSE REDUCTION: This exam was performed according to the departmental dose-optimization program which includes automated exposure control, adjustment of the mA and/or kV according to patient size and/or use of iterative reconstruction technique. COMPARISON:  None Available. FINDINGS: Brain: No evidence of acute infarction, hemorrhage, hydrocephalus, extra-axial collection or mass lesion/mass effect. Patchy white matter hypodensities are nonspecific but compatible with chronic microvascular ischemic  change. Vascular: No mastoid effusions. Skull: No acute fracture. Sinuses/Orbits: Right maxillary sinus mucosal thickening no acute orbital findings. Other: No mastoid effusions. IMPRESSION: No evidence of acute intracranial abnormality. Electronically Signed   By: Feliberto Harts M.D.   On: 10/25/2023 15:17   DG Chest Port 1 View Result Date: 10/25/2023 CLINICAL DATA:  Syncope, chest pain, hypotension EXAM: PORTABLE CHEST 1 VIEW COMPARISON:  09/25/2020 FINDINGS: 2 frontal views of the chest demonstrate a stable enlarged cardiac silhouette. Continued ectasia and atherosclerosis of the thoracic aorta. There is chronic scarring within the left perihilar region, with diffuse left-sided pleural calcifications unchanged. No acute airspace disease, effusion, or pneumothorax. No acute bony abnormalities. IMPRESSION: 1. Chronic left pleural calcifications and left perihilar scarring. No acute intrathoracic process. Electronically Signed   By: Sharlet Salina M.D.   On: 10/25/2023 15:07    Cardiac Studies   Echo 10/01/23 Impression   Left Ventricle: Systolic function is low normal. EF: 50-55%.   Left Ventricle: Wall thickness is normal.   Left Ventricle: Left ventricle size is normal.   Left Ventricle: No regional wall motion abnormalities noted.   Left Atrium: Left atrium is mildly dilated at 4.700 cm.   Tricuspid Valve: There is mild regurgitation. Narrative   This result has an attachment that is not available.  Left Ventricle Left ventricle size is normal. Wall thickness is normal. Systolic function is low normal. EF: 50-55%. No regional wall motion abnormalities noted.  Right Ventricle Right ventricle size is normal.  Left Atrium Left atrium is mildly dilated at 4.700 cm.  Right Atrium Right atrium size is normal.  IVC/SVC The inferior vena cava demonstrates a diameter of <=2.1 cm and collapses >50%; therefore, the right atrial pressure is estimated at 3 mmHg.  Mitral Valve Mitral  valve structure is normal. There is no mitral regurgitation.  Tricuspid Valve Tricuspid valve structure is normal. There is mild regurgitation. The right ventricular systolic pressure is normal (<36 mmHg).  Aortic Valve The leaflets are mildly thickened and exhibit probably normal excursion. There is no regurgitation or stenosis.  Pulmonic Valve The pulmonic valve was not well visualized.  Ascending Aorta The aortic root is normal in size.  Pericardium There is no pericardial effusion.  Study Details A limited echo was performed using limited 2D, color flow Doppler and limited spectral Doppler. Overall the study quality was suboptimal. The study was technically difficult. The study was difficult due to patient's body habitus. The imaging is on file and stored in a permanent location.  Patient Profile     78 y.o. male with chronic HFpEF (?transient low EF 2022 then improved), OSA compliant with CPAP, chronic atrial fibrillation, super morbid obesity, giant cell arteritis, arthritis, minimal plaque by carotid duplex 2022, aortic atherosclerosis, chronic dyspnea admitted with orthostasis.  Assessment & Plan    1. Orthostatic hypotension - confirmed  with pharmD and patient's med list that he has NOT been on midodrine recently - adrenal insufficiency ruled out - d/w Dr. Tenny Craw. Will start midodrine 5mg  3x per day (7, 11, 3 pm)  today with one dose of IV digoxin for HR control and see how he does - place TED hose - given constellation of AF, low voltage, neuropathy (no peripheral numbness), query whether underlying process here, question whether he would benefit from outpatient cMRI with primary cardiology team at [facility] to exclude infiltrative process  Low Voltage EKG may be related to body habitus    2. Permanent atrial fibrillation, here with RVR, mild chest pressure without chest pain - previously on Toprol 200mg  daily per records, not surprising to see HR 1teens off this -  unfortunately BP will not support aggressive reintroduction - med plan as above (midodrine 5mg  TID + one dose of IV digoxin to start) - troponins are negative, follow with HR management - remains on Xarelto   3. Chronic HFpEF - GDMT limited by orthostasis, agree with holding Lasix for now - volume status challenging with obesity but does not appear overtly volume up - intolerant to SGLT2i due to h/o GI upset with this   4. Mild anemia (stable), episodic BRBPR which patient feels is from straining - hemoccult stool, will need OP GI w/u - further per primary   5. Morbid obesity, OSA on CPAP - continue CPAP at bedtime - would check exertional O2 sat prior to DC to ensure no component of OHS   6. Difficulty sleeping, neuropathy - nurse notes indicate patient takes melatonin 20mg  at home, this seems quite high, not sure if there are any dose dependent issues with BP with this - per primary    Addendum: nurse relayed update that pt prefers to f/u with [facility] going forward, can arrange follow-up at discharge  For questions or updates, please contact [facility] Please consult [email or web address] for contact info under Cardiology/STEMI.  Signed, Laurann Montana, PA-C 10/27/2023, 11:29 AM    Pt seen and examined     Agree with findings as noted above by D Dunn Pt very dizzy early today   Orthostatics showed BP drop from 106/68 to 83/50 with HR 103 to 150 Adrenal stim test normal  On exam, pt is comfortable in bed Lungs are CTA Cardiac Irreg irreg   Distant HS Ext with Tr edema    Will add digoxin for rate control and midodrine for BP support (7, 11, 3 PM) Unfortunately HR control with afib may be difficult Pt is off of b blocker.  Had been on high dose prior to admit   BP will not allow it for now  Would liberalize salt in diet   Needs it for BP support  Dietrich Pates MD

## 2023-10-27 NOTE — TOC Initial Note (Signed)
 Transition of Care [facility]) - Initial/Assessment Note    Patient Details  Name: [name] MRN: [id number] Date of Birth: [birth year]  Transition of [facility] CM/SW Contact:    Karn Cassis, LCSW Phone Number: 10/27/2023, 3:15 PM  Clinical Narrative: Pt admitted due to near syncope. Pt reports he lives alone, but he has a caregiver 5 days a week for 5 hours a day. PT evaluated pt and recommend SNF. LCSW discussed with pt. He states he is not interested in SNF and wants to return home with home health. He has had [facility] in the past and requests them again. LCSW reached out to [place] with [facility]. Awaiting response.                  Expected Discharge Plan: Home w Home Health Services Barriers to Discharge: Continued Medical Work up   Patient Goals and CMS Choice Patient states their goals for this hospitalization and ongoing recovery are:: return home   Choice offered to / list presented to : Patient [place] ownership interest in [facility].provided to::  (n/a pt declines)    Expected Discharge Plan and Services In-house Referral: Clinical Social Work   Post Acute Care Choice: Home Health Living arrangements for the past 2 months: Single Family Home Expected Discharge Date: 10/27/23                         [facility] Arranged: PT          Prior Living Arrangements/Services Living arrangements for the past 2 months: Single Family Home Lives with:: Self Patient language and need for interpreter reviewed:: Yes Do you feel safe going back to the place where you live?: Yes      Need for Family Participation in Patient Care: No (Comment)   Current home services: DME (walker, wheelchair) Criminal Activity/Legal Involvement Pertinent to Current Situation/Hospitalization: No - Comment as needed  Activities of Daily Living   ADL Screening (condition at time of admission) Independently performs ADLs?: No Does the patient have a NEW difficulty with  bathing/dressing/toileting/self-feeding that is expected to last >3 days?: No Does the patient have a NEW difficulty with getting in/out of bed, walking, or climbing stairs that is expected to last >3 days?: No Does the patient have a NEW difficulty with communication that is expected to last >3 days?: No Is the patient deaf or have difficulty hearing?: No Does the patient have difficulty seeing, even when wearing glasses/contacts?: No Does the patient have difficulty concentrating, remembering, or making decisions?: No  Permission Sought/Granted                  Emotional Assessment     Affect (typically observed): Appropriate Orientation: : Oriented to Self, Oriented to  Time, Oriented to Situation, Oriented to Place Alcohol / Substance Use: Not Applicable Psych Involvement: No (comment)  Admission diagnosis:  Orthostatic hypotension [I95.1] Near syncope [R55] Patient Active Problem List   Diagnosis Date Noted   COVID-19 virus infection 09/26/2020   Near syncope 09/25/2020   Macrocytosis 09/25/2020   Paroxysmal atrial fibrillation (HCC) 09/25/2020   Goals of care, counseling/discussion    Palliative care encounter    Hyperkalemia 10/01/2018   Other cirrhosis of liver (HCC) 10/01/2018   Chronic back pain 10/01/2018   Cellulitis of left hip    Recurrent 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 09/04/2018   Physical debility 09/04/2018   Pleural effusion, bilateral    Hypertensive urgency 08/17/2018  Pleural effusion on left 08/17/2018   Chronic diastolic CHF (congestive heart failure) (HCC) 08/17/2018   OSA on CPAP 08/17/2018   Chronic atrial fibrillation (HCC)    Delirium    Depression    OSA (obstructive sleep apnea)    Obesity, Class III, BMI 40-49.9 (morbid obesity) (HCC)    Atrial fibrillation with RVR (HCC) 04/16/2018   Morbid obesity with BMI of 50.0-59.9, adult (HCC) 04/16/2018   AKI (acute kidney injury) (HCC) 04/16/2018   Visual hallucination 04/16/2018   SIRS  (systemic inflammatory response syndrome) (HCC) 04/16/2018   Anemia of chronic disease 04/16/2018   Left hip postoperative wound infection 03/15/2018   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   Femur fracture, left (HCC) 02/15/2018   Anemia 02/15/2018   Femur fracture (HCC) 02/15/2018   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   Spondylosis of cervical region without myelopathy or radiculopathy 04/30/2017   Gastroesophageal reflux disease 02/10/2017   SOB (shortness of breath) 03/31/2014   Hypertension    Hyponatremia 03/18/2012   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HCC) 03/17/2012   PCP:  Medicine, [facility]:   [facility] [place], [place] - [address] [place] [address] Phone: [phone] Fax: [phone]     Social Drivers of Health (SDOH) Social History: SDOH Screenings   Food Insecurity: No Food Insecurity (10/25/2023)  Housing: Low Risk  (10/26/2023)  Transportation Needs: No Transportation Needs (10/25/2023)  Utilities: Not At Risk (10/25/2023)  Financial Resource Strain: Low Risk  (09/15/2023)   Received from [facility]  Physical Activity: Sufficiently Active (01/11/2023)   Received from [facility]  Social Connections: Moderately Isolated (10/26/2023)  Stress: No Stress Concern Present (10/08/2023)   Received from [facility]  Tobacco Use: Medium Risk (10/25/2023)   SDOH Interventions:     Readmission Risk Interventions     No data to display

## 2023-10-27 NOTE — Progress Notes (Signed)
 10/26/2023 at 2145 patient complained of feeling like he was going to "black out". Patient stated he was just laying there and had not moved and felt like everything was going gray. Vital signs obtained. Telemetry reviewed. Patient conscious and talkative throughout episode. Skin was dry. Patient denies chest pain/pressure, shortness of breath.

## 2023-10-27 NOTE — Plan of Care (Signed)
  Problem: Acute Rehab PT Goals(only PT should resolve) Goal: Pt Will Go Supine/Side To Sit Outcome: Progressing Flowsheets (Taken 10/27/2023 1347) Pt will go Supine/Side to Sit: with minimal assist Goal: Patient Will Transfer Sit To/From Stand Outcome: Progressing Flowsheets (Taken 10/27/2023 1347) Patient will transfer sit to/from stand:  with minimal assist  with moderate assist Goal: Pt Will Transfer Bed To Chair/Chair To Bed Outcome: Progressing Flowsheets (Taken 10/27/2023 1347) Pt will Transfer Bed to Chair/Chair to Bed:  with min assist  with mod assist Goal: Pt Will Ambulate Outcome: Progressing Flowsheets (Taken 10/27/2023 1347) Pt will Ambulate:  10 feet  with moderate assist  with rolling walker    Venus Gilles SPT

## 2023-10-27 NOTE — Progress Notes (Signed)
 PROGRESS NOTE  [name], is a 78 y.o. male, DOB - 08-17-1946, VWU:[id number]  Admit date - 10/25/2023   Admitting Physician Onnie Boer, MD  Outpatient Primary MD for the patient is Medicine, [facility]  LOS - 0  Chief Complaint  Patient presents with   Hypotension      Brief Narrative:  78 y.o. male with medical history significant for atrial fibrillation, diastolic CHF, obesity, OSA.  Admitted on 10/24/2022 with near syncope in the setting of chronic A-fib and chronic diastolic dysfunction CHF... Patient  becomes quite tachy (HR 150s) when he gets up , he is orthostatic and dizzy/symptomatic--    -Assessment and Plan: 1)Near Syncope CT head without acute findings  -likely due to hypotension.  Reports chronic unchanged poor oral intake.  No GI losses.  -Patient was on Lasix PTA -Received IV fluids and midodrine ACTH stimulation test on 10/27/2023--Cortisol 7.5 >>18.8 >>23.8  Echo from [facility] dated 10/01/2023 shows left ventricle size is normal. Wall thickness is normal. Systolic function is low normal. EF: 50-55%. No regional wall motion abnormalities noted. -- Cardiology consult appreciated 10/27/23 -Patient  becomes quite tachy (HR 150s) when he gets up , he is orthostatic and dizzy/symptomatic- -Orthostatic Vitals--- Lying-------105/68---HR 105 Sitting ------83/50----HR 131 Standing---89/63 ---HR 150 -Per Cardiologist give IV digoxin 0.5 mg x 1 and restart midodrine  2)Chronic atrial fibrillation (HCC) -Hypotension limits ability to use beta-blockers and calcium blockers -Per cardiologist may consider digoxin or amiodarone for rate control -Cardiologist recommends IV digoxin x 1 and p.o. midodrine as above #1 -Persistent tachycardia with activity and when he stands up leading to dizziness and orthostatic issues -Patient's primary cardiologist was at [facility] -c/n  Xarelto  3)Chronic back pain Resume home hydrocodone/acetaminophen  10/325  4) morbid obesity/OSA on CPAP CPAP nightly -Low calorie diet, portion control and increase physical activity discussed with patient -Body mass index is 50.18 kg/m.  5)Chronic diastolic CHF (congestive heart failure) (HCC) Stable and compensated.  Last echo from [facility] on 10/01/23 showed  EF of 50 to 55%. -Hold PTA Lasix due to dehydration and hypotension concerns  6)Mild chronic Anemia--- hemoglobin currently close to baseline (above 12) -MCV elevated -Anemia workup noted (folate and B12 are not low, ferritin and serum iron are not low) -No obvious bleeding -Stool Hemoccult test requested  7)GERD--stable, continue Protonix  Disposition: The patient is from: Home              Anticipated d/c is to: Home              Anticipated d/c date is: 1 day              Patient currently is not medically stable to d/c. Barriers: Not Clinically Stable-   Code Status :  -  Code Status: Full Code   Family Communication:   NA (patient is alert, awake and coherent)   DVT Prophylaxis  :   - SCDs   Place TED hose Start: 10/27/23 1131 rivaroxaban (XARELTO) tablet 20 mg   Lab Results  Component Value Date   PLT 172 10/27/2023   Inpatient Medications  Scheduled Meds:  digoxin  0.5 mg Intravenous Once   escitalopram  10 mg Oral Daily   gabapentin  600 mg Oral Daily   And   gabapentin  300 mg Oral QHS   melatonin  6 mg Oral QHS   midodrine  5 mg Oral TID WC   pantoprazole  40 mg Oral Daily  rivaroxaban  20 mg Oral QPM   Continuous Infusions: PRN Meds:.acetaminophen **OR** acetaminophen, HYDROcodone-acetaminophen, ondansetron **OR** ondansetron (ZOFRAN) IV, polyethylene glycol, traZODone   Anti-infectives (From admission, onward)    None       Subjective: [name] today has no fevers, no emesis,  No chest pain,   - Fatigue and generalized weakness persist - Patient  becomes quite tachy (HR 150s) when he gets up , he is orthostatic and  dizzy/symptomatic--  -Orthostatic Vitals--- Lying-------105/68---HR 105 Sitting ------83/50----HR 131 Standing---89/63 ---HR 150  Objective: Vitals:   10/26/23 2334 10/27/23 0341 10/27/23 0400 10/27/23 0839  BP: 101/61 119/70    Pulse:  94    Resp:   10   Temp:  98.8 F (37.1 C)  98.8 F (37.1 C)  TempSrc:  Axillary  Axillary  SpO2:  93%    Weight:      Height:        Intake/Output Summary (Last 24 hours) at 10/27/2023 1141 Last data filed at 10/27/2023 1055 Gross per 24 hour  Intake 480 ml  Output 2100 ml  Net -1620 ml   Filed Weights   10/25/23 1426 10/25/23 2013  Weight: (!) 145.2 kg (!) 149.7 kg    Physical Exam  Gen:- Awake Alert,  in no apparent distress  HEENT:- [place].AT, No sclera icterus Neck-Supple Neck,No JVD,.  Lungs-  CTAB , fair symmetrical air movement CV- S1, S2 normal, irregularly irregular Abd-  +ve B.Sounds, Abd Soft, No tenderness,    Extremity/Skin:- No  edema, pedal pulses present  Psych-affect is appropriate, oriented x3 Neuro-generalized weakness ,no new focal deficits, no tremors  Data Reviewed: I have personally reviewed following labs and imaging studies  CBC: Recent Labs  Lab 10/25/23 1541 10/27/23 0504  WBC 5.6 4.5  HGB 12.9* 11.8*  HCT 41.1 38.6*  MCV 101.5* 101.8*  PLT 181 172   Basic Metabolic Panel: Recent Labs  Lab 10/25/23 1541 10/27/23 0504  NA 139 137  K 4.5 4.6  CL 102 106  CO2 27 24  GLUCOSE 102* 91  BUN 21 19  CREATININE 1.07 1.15  CALCIUM 9.3 9.0   GFR: Estimated Creatinine Clearance: 76.8 mL/min (by C-G formula based on SCr of 1.15 mg/dL).  Radiology Studies: CT Head Wo Contrast Result Date: 10/25/2023 CLINICAL DATA:  Syncope/presyncope, cerebrovascular cause suspected EXAM: CT HEAD WITHOUT CONTRAST TECHNIQUE: Contiguous axial images were obtained from the base of the skull through the vertex without intravenous contrast. RADIATION DOSE REDUCTION: This exam was performed according to the departmental  dose-optimization program which includes automated exposure control, adjustment of the mA and/or kV according to patient size and/or use of iterative reconstruction technique. COMPARISON:  None Available. FINDINGS: Brain: No evidence of acute infarction, hemorrhage, hydrocephalus, extra-axial collection or mass lesion/mass effect. Patchy white matter hypodensities are nonspecific but compatible with chronic microvascular ischemic change. Vascular: No mastoid effusions. Skull: No acute fracture. Sinuses/Orbits: Right maxillary sinus mucosal thickening no acute orbital findings. Other: No mastoid effusions. IMPRESSION: No evidence of acute intracranial abnormality. Electronically Signed   By: Feliberto Harts M.D.   On: 10/25/2023 15:17   DG Chest Port 1 View Result Date: 10/25/2023 CLINICAL DATA:  Syncope, chest pain, hypotension EXAM: PORTABLE CHEST 1 VIEW COMPARISON:  09/25/2020 FINDINGS: 2 frontal views of the chest demonstrate a stable enlarged cardiac silhouette. Continued ectasia and atherosclerosis of the thoracic aorta. There is chronic scarring within the left perihilar region, with diffuse left-sided pleural calcifications unchanged. No acute airspace disease, effusion, or pneumothorax.  No acute bony abnormalities. IMPRESSION: 1. Chronic left pleural calcifications and left perihilar scarring. No acute intrathoracic process. Electronically Signed   By: Sharlet Salina M.D.   On: 10/25/2023 15:07   Scheduled Meds:  digoxin  0.5 mg Intravenous Once   escitalopram  10 mg Oral Daily   gabapentin  600 mg Oral Daily   And   gabapentin  300 mg Oral QHS   melatonin  6 mg Oral QHS   midodrine  5 mg Oral TID WC   pantoprazole  40 mg Oral Daily   rivaroxaban  20 mg Oral QPM   Continuous Infusions:   LOS: 0 days   Shon Hale M.D on 10/27/2023 at 11:41 AM  Go to [email or web address] - for contact info  [facility] - Office  [phone]  If 7PM-7AM, please contact  night-coverage [email or web address] 10/27/2023, 11:41 AM

## 2023-10-27 NOTE — Plan of Care (Signed)
  Problem: Education: Goal: Knowledge of General Education information will improve Description: Including pain rating scale, medication(s)/side effects and non-pharmacologic comfort measures Outcome: Progressing   Problem: Health Behavior/Discharge Planning: Goal: Ability to manage health-related needs will improve Outcome: Not Progressing   Problem: Clinical Measurements: Goal: Ability to maintain clinical measurements within normal limits will improve Outcome: Progressing   Problem: Safety: Goal: Ability to remain free from injury will improve Outcome: Progressing

## 2023-10-27 NOTE — Evaluation (Signed)
 Physical Therapy Evaluation Patient Details Name: [name] MRN: [id number] DOB: 03-16-1946 Today's Date: 10/27/2023  History of Present Illness  [name] is a 78 y.o. male with medical history significant for atrial fibrillation, diastolic CHF, obesity, OSA.   Patient presented to the ED with complaints of low blood pressure.  Reports that started about 3 months ago, he was subsequently taken off his metoprolol with some improvement.  But over the past week he has had recurrence of his low blood pressure, with dizziness feeling like his vision was going black.  Today he had the worst symptoms, feeling like he was going to pass out, but he did not.  Reports chronic poor oral intake, no vomiting or diarrhea.  He is on Lasix 20 mg daily.  Reports some mild chest tightness to the left side of his chest nonradiating yesterday while he was resting and resolved.  He thinks this is related to his cough.  No lower extremity swelling.   Clinical Impression  Patient was agreeable to therapy. Was mod/max for tasks assessed. Patient required pull to sit during supine to sit. Used RW for transfers and ambulation. Max assist for sit to stand from lower surface level. When bed was raised up patient was a mod assist with RW. Limited by fatigue during session. BP was monitored during session. Stayed at 103/67 when taken at different orthostatic positions. Patient did feel dizzy towards the end of session. Was left in chair at conclusion of session. Patient will benefit from continued skilled physical therapy in hospital and recommended venue below to increase strength, balance, endurance for safe ADLs and gait.          If plan is discharge home, recommend the following: A lot of help with bathing/dressing/bathroom;A lot of help with walking and/or transfers;Assistance with cooking/housework;Help with stairs or ramp for entrance   Can travel by private vehicle        Equipment Recommendations None  recommended by PT  Recommendations for Other Services       Functional Status Assessment Patient has had a recent decline in their functional status and demonstrates the ability to make significant improvements in function in a reasonable and predictable amount of time.     Precautions / Restrictions Precautions Precautions: Fall Restrictions Weight Bearing Restrictions Per Provider Order: No      Mobility  Bed Mobility Overal bed mobility: Needs Assistance Bed Mobility: Supine to Sit     Supine to sit: Mod assist, HOB elevated     General bed mobility comments: assistance to pull to seated position. aide helps with this at home Patient Response: Cooperative  Transfers Overall transfer level: Needs assistance Equipment used: Rolling walker (2 wheels) Transfers: Sit to/from Stand, Bed to chair/wheelchair/BSC Sit to Stand: Mod assist, Max assist   Step pivot transfers: Mod assist, Max assist       General transfer comment: with bed raised patient was more of a mod assist but max for a lower level surface    Ambulation/Gait Ambulation/Gait assistance: Mod assist Gait Distance (Feet): 3 Feet (side steps) Assistive device: Rolling walker (2 wheels) Gait Pattern/deviations: Step-to pattern, Decreased step length - right, Decreased step length - left, Decreased stride length, Wide base of support Gait velocity: slow     General Gait Details: slow, labored movement with heavy reliance on RW  Stairs            Wheelchair Mobility     Tilt Bed Tilt Bed Patient Response: Cooperative  Modified Rankin (Stroke Patients Only)       Balance Overall balance assessment: Needs assistance Sitting-balance support: Feet supported, Bilateral upper extremity supported Sitting balance-[name]: Fair Sitting balance - Comments: anterior lean when EOB   Standing balance support: Bilateral upper extremity supported, During functional activity, Reliant on assistive  device for balance Standing balance-[name]: Fair Standing balance comment: use RW                             Pertinent Vitals/Pain Pain Assessment Pain Assessment: No/denies pain    Home Living Family/patient expects to be discharged to:: Private residence Living Arrangements: Alone Available Help at Discharge: Family;Available PRN/intermittently;Personal care attendant (aide come 5x/wk for 4 hrs a day) Type of Home: House Home Access: Ramped entrance       Home Layout: One level Home Equipment: Agricultural consultant (2 wheels);Wheelchair - manual;BSC/3in1;Hospital bed      Prior Function Prior Level of Function : Needs assist       Physical Assist : Mobility (physical);ADLs (physical) Mobility (physical): Bed mobility ADLs (physical): Bathing;Dressing Mobility Comments: aide helps get in and out of bed ADLs Comments: helps with dressing and bathing     Extremity/Trunk Assessment   Upper Extremity Assessment Upper Extremity Assessment: Generalized weakness    Lower Extremity Assessment Lower Extremity Assessment: Generalized weakness    Cervical / Trunk Assessment Cervical / Trunk Assessment: Kyphotic  Communication   Communication Communication: No apparent difficulties    Cognition Arousal: Alert Behavior During Therapy: WFL for tasks assessed/performed   PT - Cognitive impairments: No apparent impairments                         Following commands: Intact       Cueing Cueing Techniques: Verbal cues, Tactile cues     General Comments      Exercises     Assessment/Plan    PT Assessment Patient needs continued PT services  PT Problem List Decreased strength;Decreased range of motion;Decreased activity tolerance;Decreased balance;Decreased mobility       PT Treatment Interventions DME instruction;Gait training;Patient/family education;Stair training;Functional mobility training;Therapeutic activities;Therapeutic  exercise;Balance training    PT Goals (Current goals can be found in the Care Plan section)  Acute Rehab PT Goals Patient Stated Goal: to return home after rehab PT Goal Formulation: With patient Time For Goal Achievement: 11/10/23 Potential to Achieve Goals: Good    Frequency Min 3X/week     Co-evaluation               AM-PAC PT "6 Clicks" Mobility  Outcome Measure Help needed turning from your back to your side while in a flat bed without using bedrails?: A Lot Help needed moving from lying on your back to sitting on the side of a flat bed without using bedrails?: A Lot Help needed moving to and from a bed to a chair (including a wheelchair)?: A Lot Help needed standing up from a chair using your arms (e.g., wheelchair or bedside chair)?: A Lot Help needed to walk in hospital room?: A Lot Help needed climbing 3-5 steps with a railing? : Total 6 Click Score: 11    End of Session Equipment Utilized During Treatment: Gait belt Activity Tolerance: Patient tolerated treatment well;Patient limited by fatigue Patient left: in chair;with call bell/phone within reach Nurse Communication: Mobility status PT Visit Diagnosis: Unsteadiness on feet (R26.81);Other abnormalities of gait and mobility (R26.89);Muscle weakness (  generalized) (M62.81)    Time: 4098-1191 PT Time Calculation (min) (ACUTE ONLY): 30 min   Charges:   PT Evaluation $PT Eval Moderate Complexity: 1 Mod PT Treatments $Therapeutic Activity: 23-37 mins PT General Charges $$ ACUTE PT VISIT: 1 Visit         Aashish Hamm SPT

## 2023-10-28 DIAGNOSIS — I5032 Chronic diastolic (congestive) heart failure: Secondary | ICD-10-CM | POA: Diagnosis not present

## 2023-10-28 DIAGNOSIS — I951 Orthostatic hypotension: Secondary | ICD-10-CM | POA: Diagnosis not present

## 2023-10-28 DIAGNOSIS — I482 Chronic atrial fibrillation, unspecified: Secondary | ICD-10-CM | POA: Diagnosis not present

## 2023-10-28 DIAGNOSIS — I480 Paroxysmal atrial fibrillation: Secondary | ICD-10-CM

## 2023-10-28 LAB — BASIC METABOLIC PANEL
Anion gap: 8 (ref 5–15)
BUN: 21 mg/dL (ref 8–23)
CO2: 26 mmol/L (ref 22–32)
Calcium: 8.9 mg/dL (ref 8.9–10.3)
Chloride: 102 mmol/L (ref 98–111)
Creatinine, Ser: 1.18 mg/dL (ref 0.61–1.24)
GFR, Estimated: >60 mL/min (ref >60)
Glucose, Bld: 123 mg/dL — ABNORMAL HIGH (ref 70–99)
Potassium: 4.8 mmol/L (ref 3.5–5.1)
Sodium: 136 mmol/L (ref 135–145)

## 2023-10-28 LAB — CBC
HCT: 35.3 % — ABNORMAL LOW (ref 39.0–52.0)
Hemoglobin: 10.8 g/dL — ABNORMAL LOW (ref 13.0–17.0)
MCH: 31.2 pg (ref 26.0–34.0)
MCHC: 30.6 g/dL (ref 30.0–36.0)
MCV: 102 fL — ABNORMAL HIGH (ref 80.0–100.0)
Platelets: 174 10*3/uL (ref 150–400)
RBC: 3.46 MIL/uL — ABNORMAL LOW (ref 4.22–5.81)
RDW: 13.7 % (ref 11.5–15.5)
WBC: 5.7 10*3/uL (ref 4.0–10.5)
nRBC: 0 % (ref 0.0–0.2)

## 2023-10-28 MED ORDER — ORAL CARE MOUTH RINSE: 15.0000 mL | OROMUCOSAL | Status: DC | PRN

## 2023-10-28 MED ORDER — MIDODRINE HCL 5 MG PO TABS
7.5000 mg | ORAL_TABLET | Freq: Three times a day (TID) | ORAL | Status: DC
  Administered 2023-10-28 – 2023-10-29 (×3): 7.5 mg via ORAL
  Filled 2023-10-28 (×3): qty 2

## 2023-10-28 NOTE — Progress Notes (Signed)
 PROGRESS NOTE  [name], is a 78 y.o. male, DOB - [birth year], GUY:[id number]  Admit date - 10/25/2023   Admitting Physician Onnie Boer, MD  Outpatient Primary MD for the patient is Pricilla Riffle, MD  LOS - 0  Chief Complaint  Patient presents with   Hypotension      Brief Narrative:  78 y.o. male with medical history significant for atrial fibrillation, diastolic CHF, obesity, OSA.  Admitted on 10/24/2022 with near syncope in the setting of chronic A-fib and chronic diastolic dysfunction CHF... Patient  becomes quite tachy (HR 150s) when he gets up , he is orthostatic and dizzy/symptomatic--    -Assessment and Plan: 1)Near Syncope CT head without acute findings  -likely due to hypotension.  Reports chronic unchanged poor oral intake.  No GI losses.  -Patient was on Lasix PTA -Received IV fluids and midodrine ACTH stimulation test on 10/27/2023--Cortisol 7.5 >>18.8 >>23.8  Echo from [facility] dated 10/01/2023 shows left ventricle size is normal. Wall thickness is normal. Systolic function is low normal. EF: 50-55%. No regional wall motion abnormalities noted. -- Cardiology consult appreciated -Still reporting dizziness and palpitation with activity; positive orthostatic changes. -Per Cardiologist continue digoxin and midodrine.  2)Chronic atrial fibrillation (HCC) -Hypotension limits ability to use beta-blockers and calcium blockers -Persistent tachycardia with activity and when he stands up leading to dizziness and orthostatic issues. -Per cardiology recommendation continue treatment with digoxin and adjusted dose of midodrine; if needed will require amiodarone treatment. -Patient's primary cardiologist was at [facility]; per discussion with patient planning to establish care with [facility] cardiology service. -c/n  Xarelto  3)Chronic back pain Resume home hydrocodone/acetaminophen 10/325  4) morbid obesity/OSA on CPAP CPAP nightly -Low calorie diet, portion  control and increase physical activity discussed with patient -Body mass index is 50.18 kg/m.  5)Chronic diastolic CHF (congestive heart failure) (HCC) Stable and compensated.  Last echo from [facility] on 10/01/23 showed  EF of 50 to 55%. -Hold PTA Lasix due to dehydration and hypotension concerns  6)Mild chronic Anemia--- hemoglobin currently close to baseline (above 12) -MCV elevated -Anemia workup noted (folate and B12 are not low, ferritin and serum iron are not low) -No obvious bleeding -Stool Hemoccult test requested  7)GERD--stable, continue Protonix  Disposition: The patient is from: Home              Anticipated d/c is to: Home              Anticipated d/c date is: 1 day              Patient currently is not medically stable to d/c.  Barriers: Not Clinically Stable-   Code Status :  -  Code Status: Full Code   Family Communication:   NA (patient is alert, awake and coherent)   DVT Prophylaxis  :   - SCDs   Place TED hose Start: 10/27/23 1131 rivaroxaban (XARELTO) tablet 20 mg   Lab Results  Component Value Date   PLT 174 10/28/2023   Inpatient Medications  Scheduled Meds:  digoxin  0.25 mg Oral Daily   escitalopram  10 mg Oral Daily   gabapentin  600 mg Oral Daily   And   gabapentin  300 mg Oral QHS   melatonin  6 mg Oral QHS   midodrine  7.5 mg Oral TID WC   pantoprazole  40 mg Oral Daily   rivaroxaban  20 mg Oral QPM   Continuous Infusions: PRN Meds:.acetaminophen **OR**  acetaminophen, HYDROcodone-acetaminophen, ondansetron **OR** ondansetron (ZOFRAN) IV, mouth rinse, polyethylene glycol, traZODone   Anti-infectives (From admission, onward)    None       Subjective: [name] no fever, no chest pain, no nausea vomiting.  Patient reports generalized fatigue/weakness and with activity continued to feel dizzy and experiencing palpitation.  Positive orthostatic vital sign changes appreciated.  Objective: Vitals:   10/28/23 0250 10/28/23 1432  10/28/23 1445 10/28/23 1630  BP: (!) 102/56 (!) 104/59  125/78  Pulse: 98 85  83  Resp:   18   Temp: 98.2 F (36.8 C) (!) 97.5 F (36.4 C)    TempSrc: Oral Axillary    SpO2: 93% 97%  100%  Weight:      Height:        Intake/Output Summary (Last 24 hours) at 10/28/2023 1913 Last data filed at 10/28/2023 1841 Gross per 24 hour  Intake --  Output 3050 ml  Net -3050 ml   Filed Weights   10/25/23 1426 10/25/23 2013  Weight: (!) 145.2 kg (!) 149.7 kg    Physical Exam General exam: Alert, awake, oriented x 3; morbidly obese in appearance; no chest pain, no nausea, no vomiting.  Comfortable at rest.  Per nursing report is still experiencing elevated heart rate and dizziness sensation with activity. Respiratory system: No using accessory muscles.  Good air movement bilaterally.  Good saturation on room air. Cardiovascular system: Rate controlled at rest.. No rubs or gallops; unable to assess JVD with body habitus. Gastrointestinal system: Abdomen is obese, nondistended, soft and nontender. No organomegaly or masses felt. Normal bowel sounds heard. Central nervous system: No focal neurological deficits. Extremities: No cyanosis or clubbing. Skin: No petechiae. Psychiatry: Judgement and insight appear normal. Mood & affect appropriate.   Data Reviewed: I have personally reviewed following labs and imaging studies  CBC: Recent Labs  Lab 10/25/23 1541 10/27/23 0504 10/28/23 0244  WBC 5.6 4.5 5.7  HGB 12.9* 11.8* 10.8*  HCT 41.1 38.6* 35.3*  MCV 101.5* 101.8* 102.0*  PLT 181 172 174   Basic Metabolic Panel: Recent Labs  Lab 10/25/23 1541 10/27/23 0504 10/28/23 0244  NA 139 137 136  K 4.5 4.6 4.8  CL 102 106 102  CO2 27 24 26   GLUCOSE 102* 91 123*  BUN 21 19 21   CREATININE 1.07 1.15 1.18  CALCIUM 9.3 9.0 8.9   GFR: Estimated Creatinine Clearance: 74.8 mL/min (by C-G formula based on SCr of 1.18 mg/dL).  Radiology Studies: No results found.  Scheduled Meds:   digoxin  0.25 mg Oral Daily   escitalopram  10 mg Oral Daily   gabapentin  600 mg Oral Daily   And   gabapentin  300 mg Oral QHS   melatonin  6 mg Oral QHS   midodrine  7.5 mg Oral TID WC   pantoprazole  40 mg Oral Daily   rivaroxaban  20 mg Oral QPM   Continuous Infusions:   LOS: 0 days   Vassie Loll M.D on 10/28/2023 at 7:13 PM  Go to [email or web address] - for contact info  [facility] - Office  [phone]  If 7PM-7AM, please contact night-coverage [email or web address] 10/28/2023, 7:13 PM

## 2023-10-28 NOTE — Plan of Care (Signed)
  Problem: Health Behavior/Discharge Planning: Goal: Ability to manage health-related needs will improve Outcome: Progressing   Problem: Clinical Measurements: Goal: Ability to maintain clinical measurements within normal limits will improve Outcome: Progressing   Problem: Activity: Goal: Risk for activity intolerance will decrease Outcome: Progressing   Problem: Pain Managment: Goal: General experience of comfort will improve and/or be controlled Outcome: Progressing   Problem: Safety: Goal: Ability to remain free from injury will improve Outcome: Progressing

## 2023-10-28 NOTE — Plan of Care (Signed)
   Problem: Education: Goal: Knowledge of General Education information will improve Description: Including pain rating scale, medication(s)/side effects and non-pharmacologic comfort measures Outcome: Progressing   Problem: Activity: Goal: Risk for activity intolerance will decrease Outcome: Progressing   Problem: Nutrition: Goal: Adequate nutrition will be maintained Outcome: Progressing   Problem: Coping: Goal: Level of anxiety will decrease Outcome: Progressing

## 2023-10-28 NOTE — Progress Notes (Signed)
 Patient able to place home CPAP on by himself. RT added water to reservoir for him and performed safety check. Told patient to call if he needed anything else.

## 2023-10-28 NOTE — Progress Notes (Signed)
 Progress Note  Patient Name: [name] Date of Encounter: 10/28/2023  Primary Cardiologist: Dietrich Pates, MD  Interval Summary   Chart reviewed including cardiology consultation and follow-up by Dr. Tenny Craw.  Patient remains orthostatic by vital signs from yesterday although he is heart rate in atrial fibrillation is reasonable in the absence of AV nodal blockers.  Currently not on diuretics with net urine output of approximately 2600 cc yesterday.  Vital Signs    Vitals:   10/27/23 1928 10/27/23 2300 10/28/23 0000 10/28/23 0250  BP: (!) 99/57   (!) 102/56  Pulse: 99   98  Resp:  19 13   Temp: 98.6 F (37 C)   98.2 F (36.8 C)  TempSrc: Oral   Oral  SpO2: 93%   93%  Weight:      Height:        Intake/Output Summary (Last 24 hours) at 10/28/2023 0954 Last data filed at 10/28/2023 0932 Gross per 24 hour  Intake 240 ml  Output 3150 ml  Net -2910 ml   Filed Weights   10/25/23 1426 10/25/23 2013  Weight: (!) 145.2 kg (!) 149.7 kg    Physical Exam   GEN: Morbidly obese male.  No acute distress.   Neck: Difficult to assess JVP. Cardiac: Irregularly irregular without gallop.  Respiratory: Nonlabored. Clear to auscultation anteriorly. GI: Soft, nontender, bowel sounds present. MS: No edema.  ECG/Telemetry    Telemetry reviewed showing atrial fibrillation.  Labs    Chemistry Recent Labs  Lab 10/25/23 1541 10/27/23 0504 10/28/23 0244  NA 139 137 136  K 4.5 4.6 4.8  CL 102 106 102  CO2 27 24 26   GLUCOSE 102* 91 123*  BUN 21 19 21   CREATININE 1.07 1.15 1.18  CALCIUM 9.3 9.0 8.9  GFRNONAA >60 >60 >60  ANIONGAP 10 7 8     Hematology Recent Labs  Lab 10/25/23 1541 10/27/23 0504 10/28/23 0244  WBC 5.6 4.5 5.7  RBC 4.05* 3.79*  3.70* 3.46*  HGB 12.9* 11.8* 10.8*  HCT 41.1 38.6* 35.3*  MCV 101.5* 101.8* 102.0*  MCH 31.9 31.1 31.2  MCHC 31.4 30.6 30.6  RDW 14.0 14.2 13.7  PLT 181 172 174   Cardiac Enzymes Recent Labs  Lab 10/25/23 1541  10/25/23 1734  TROPONINIHS 5 7    Cardiac Studies   Echocardiogram 10/01/2023 ([facility]): Left Ventricle: Systolic function is low normal. EF: 50-55%.    Left Ventricle: Wall thickness is normal.    Left Ventricle: Left ventricle size is normal.    Left Ventricle: No regional wall motion abnormalities noted.    Left Atrium: Left atrium is mildly dilated at 4.700 cm.    Tricuspid Valve: There is mild regurgitation.   Assessment & Plan   1.  Orthostatic hypotension.  Patient just started on midodrine, now at 7.5 mg 3 times daily, would repeat orthostatic vital signs today.  LVEF 50 to 55% by recent outside echocardiogram.  Adrenal insufficiency excluded.  Agree with considering further outpatient workup for infiltrative cardiomyopathy.  2.  Permanent atrial fibrillation with CHA2DS2-VASc score of 4.  No longer on beta-blocker, now on digoxin 0.25 mg daily, heart rate is in the 90s at rest.  Continues on Xarelto 20 mg daily for stroke prophylaxis.  3.  History of chronic HFpEF.  LVEF 50 to 55% by recent echocardiogram in January.  At present not on diuretics and auto diuresing.  Has prior history of intolerance to SGLT2 inhibitors due to GI upset.  4.  Morbid obesity with OSA on CPAP.  5.  Anemia, hemoglobin relatively stable at 10.8, MCV 102.  Does report occasional hematochezia with straining, possibly also has hemorrhoids.  Would guaiac stools.  Recheck orthostatic vital signs today on midodrine 7.5 mg 3 times daily.  Continue digoxin 0.25 mg daily and Xarelto 20 mg daily.  Increase activity and work with PT.  For questions or updates, please contact [facility] Please consult [email or web address] for contact info under   Signed, Nona Dell, MD  10/28/2023, 9:54 AM

## 2023-10-28 NOTE — TOC Progression Note (Signed)
 Transition of Care [facility]) - Progression Note    Patient Details  Name: [name] MRN: [id number] Date of Birth: 11/24/1945  [facility] CM/SW Contact  Karn Cassis, [place] Phone Number: 10/28/2023, 1:28 PM  Clinical Narrative: Velna Hatchet with [facility] can accept for HHPT.       Expected Discharge Plan: Home w Home Health Services Barriers to Discharge: Continued Medical Work up  Expected Discharge Plan and Services In-house Referral: Clinical Social Work   Post Acute Care Choice: Home Health Living arrangements for the past 2 months: Single Family Home Expected Discharge Date: 10/27/23                         [facility] Arranged: PT HH Agency: [facility] Date HH Agency Contacted: 10/28/23 Time HH Agency Contacted: 1105 Representative spoke with at [facility] Agency: Velna Hatchet   Social Determinants of Health (SDOH) Interventions SDOH Screenings   Food Insecurity: No Food Insecurity (10/25/2023)  Housing: Low Risk  (10/26/2023)  Transportation Needs: No Transportation Needs (10/25/2023)  Utilities: Not At Risk (10/25/2023)  Financial Resource Strain: Low Risk  (09/15/2023)   Received from [facility]  Physical Activity: Sufficiently Active (01/11/2023)   Received from [facility]  Social Connections: Moderately Isolated (10/26/2023)  Stress: No Stress Concern Present (10/08/2023)   Received from [facility]  Tobacco Use: Medium Risk (10/25/2023)    Readmission Risk Interventions     No data to display

## 2023-10-29 DIAGNOSIS — R55 Syncope and collapse: Secondary | ICD-10-CM | POA: Diagnosis present

## 2023-10-29 DIAGNOSIS — D649 Anemia, unspecified: Secondary | ICD-10-CM | POA: Diagnosis present

## 2023-10-29 DIAGNOSIS — E872 Acidosis, unspecified: Secondary | ICD-10-CM | POA: Diagnosis present

## 2023-10-29 DIAGNOSIS — Z87891 Personal history of nicotine dependence: Secondary | ICD-10-CM | POA: Diagnosis not present

## 2023-10-29 DIAGNOSIS — I480 Paroxysmal atrial fibrillation: Secondary | ICD-10-CM | POA: Diagnosis not present

## 2023-10-29 DIAGNOSIS — Z91048 Other nonmedicinal substance allergy status: Secondary | ICD-10-CM | POA: Diagnosis not present

## 2023-10-29 DIAGNOSIS — Z87892 Personal history of anaphylaxis: Secondary | ICD-10-CM | POA: Diagnosis not present

## 2023-10-29 DIAGNOSIS — I482 Chronic atrial fibrillation, unspecified: Secondary | ICD-10-CM | POA: Diagnosis not present

## 2023-10-29 DIAGNOSIS — Z811 Family history of alcohol abuse and dependence: Secondary | ICD-10-CM | POA: Diagnosis not present

## 2023-10-29 DIAGNOSIS — Z7901 Long term (current) use of anticoagulants: Secondary | ICD-10-CM | POA: Diagnosis not present

## 2023-10-29 DIAGNOSIS — I110 Hypertensive heart disease with heart failure: Secondary | ICD-10-CM | POA: Diagnosis present

## 2023-10-29 DIAGNOSIS — I5032 Chronic diastolic (congestive) heart failure: Secondary | ICD-10-CM | POA: Diagnosis present

## 2023-10-29 DIAGNOSIS — Z6841 Body Mass Index (BMI) 40.0 and over, adult: Secondary | ICD-10-CM | POA: Diagnosis not present

## 2023-10-29 DIAGNOSIS — Z96643 Presence of artificial hip joint, bilateral: Secondary | ICD-10-CM | POA: Diagnosis present

## 2023-10-29 DIAGNOSIS — M549 Dorsalgia, unspecified: Secondary | ICD-10-CM | POA: Diagnosis present

## 2023-10-29 DIAGNOSIS — Z91030 Bee allergy status: Secondary | ICD-10-CM | POA: Diagnosis not present

## 2023-10-29 DIAGNOSIS — E860 Dehydration: Secondary | ICD-10-CM | POA: Diagnosis present

## 2023-10-29 DIAGNOSIS — G4733 Obstructive sleep apnea (adult) (pediatric): Secondary | ICD-10-CM | POA: Diagnosis present

## 2023-10-29 DIAGNOSIS — K219 Gastro-esophageal reflux disease without esophagitis: Secondary | ICD-10-CM | POA: Diagnosis present

## 2023-10-29 DIAGNOSIS — I951 Orthostatic hypotension: Secondary | ICD-10-CM | POA: Diagnosis present

## 2023-10-29 DIAGNOSIS — I4821 Permanent atrial fibrillation: Secondary | ICD-10-CM | POA: Diagnosis present

## 2023-10-29 DIAGNOSIS — Z79899 Other long term (current) drug therapy: Secondary | ICD-10-CM | POA: Diagnosis not present

## 2023-10-29 DIAGNOSIS — G629 Polyneuropathy, unspecified: Secondary | ICD-10-CM | POA: Diagnosis present

## 2023-10-29 DIAGNOSIS — M170 Bilateral primary osteoarthritis of knee: Secondary | ICD-10-CM | POA: Diagnosis present

## 2023-10-29 DIAGNOSIS — G8929 Other chronic pain: Secondary | ICD-10-CM | POA: Diagnosis present

## 2023-10-29 DIAGNOSIS — M316 Other giant cell arteritis: Secondary | ICD-10-CM | POA: Diagnosis present

## 2023-10-29 LAB — OCCULT BLOOD X 1 CARD TO LAB, STOOL: Fecal Occult Bld: NEGATIVE

## 2023-10-29 MED ORDER — MIDODRINE HCL 5 MG PO TABS
10.0000 mg | ORAL_TABLET | Freq: Three times a day (TID) | ORAL | Status: DC
  Administered 2023-10-29 – 2023-10-30 (×3): 10 mg via ORAL
  Filled 2023-10-29 (×3): qty 2

## 2023-10-29 NOTE — Progress Notes (Signed)
 Progress Note  Patient Name: [name] Date of Encounter: 10/29/2023  Primary Cardiologist: Dietrich Pates, MD  Interval Summary   Chart reviewed.  Patient in bedside chair this morning eating breakfast.  States that he does feel better.  Orthostatic vital signs today look much better.  Net urine output of 1900 cc last 24 hours in the absence of diuretics.  Vital Signs    Vitals:   10/28/23 1630 10/28/23 1928 10/29/23 0634 10/29/23 0748  BP: 125/78  130/80 129/80  Pulse: 83 98 85   Resp:      Temp:  97.8 F (36.6 C) 97.8 F (36.6 C) (!) 97.5 F (36.4 C)  TempSrc:  Oral Oral Oral  SpO2: 100% 100% 99% 98%  Weight:      Height:        Intake/Output Summary (Last 24 hours) at 10/29/2023 0819 Last data filed at 10/29/2023 4098 Gross per 24 hour  Intake 720 ml  Output 2400 ml  Net -1680 ml   Filed Weights   10/25/23 1426 10/25/23 2013  Weight: (!) 145.2 kg (!) 149.7 kg    Physical Exam   GEN: Morbidly obese male.  No acute distress.   Neck: Difficult to assess JVP. Cardiac: Irregularly irregular, no gallop.  Respiratory: Nonlabored. Clear to auscultation anteriorly. GI: Soft, nontender, bowel sounds present. MS: No edema.  ECG/Telemetry    Telemetry reviewed showing atrial fibrillation.  Labs    Chemistry Recent Labs  Lab 10/25/23 1541 10/27/23 0504 10/28/23 0244  NA 139 137 136  K 4.5 4.6 4.8  CL 102 106 102  CO2 27 24 26   GLUCOSE 102* 91 123*  BUN 21 19 21   CREATININE 1.07 1.15 1.18  CALCIUM 9.3 9.0 8.9  GFRNONAA >60 >60 >60  ANIONGAP 10 7 8     Hematology Recent Labs  Lab 10/25/23 1541 10/27/23 0504 10/28/23 0244  WBC 5.6 4.5 5.7  RBC 4.05* 3.79*  3.70* 3.46*  HGB 12.9* 11.8* 10.8*  HCT 41.1 38.6* 35.3*  MCV 101.5* 101.8* 102.0*  MCH 31.9 31.1 31.2  MCHC 31.4 30.6 30.6  RDW 14.0 14.2 13.7  PLT 181 172 174   Cardiac Enzymes Recent Labs  Lab 10/25/23 1541 10/25/23 1734  TROPONINIHS 5 7    Cardiac Studies   Echocardiogram  10/01/2023 ([facility]): Left Ventricle: Systolic function is low normal. EF: 50-55%.    Left Ventricle: Wall thickness is normal.    Left Ventricle: Left ventricle size is normal.    Left Ventricle: No regional wall motion abnormalities noted.    Left Atrium: Left atrium is mildly dilated at 4.700 cm.    Tricuspid Valve: There is mild regurgitation.   Assessment & Plan   1.  Orthostatic hypotension. LVEF 50 to 55% by recent outside echocardiogram in January.  Adrenal insufficiency excluded.  He is responding well to midodrine, orthostatic vital signs today looked better.  As discussed in prior notes, can consider outpatient workup for infiltrative cardiomyopathy and follow-up with Dr. Tenny Craw.  2.  Permanent atrial fibrillation with CHA2DS2-VASc score of 4.  Now on digoxin 0.25 mg daily, resting heart rate looks good overall.  Continues on Xarelto 20 mg daily for stroke prophylaxis.  3.  History of chronic HFpEF.  LVEF 50 to 55% by recent echocardiogram in January.  At present not on diuretics and auto diuresing.  Has prior history of intolerance to SGLT2 inhibitors due to GI upset.  4.  Morbid obesity with OSA on CPAP.  5.  Anemia, hemoglobin relatively stable at 10.8, MCV 102.  Does report occasional hematochezia with straining, possibly also has hemorrhoids.  Would guaiac stools.  Patient needs to mobilize and work with PT.  Heart rate does pick up with activity, but settles down fairly well at rest on digoxin 0.25 mg daily.  Would not resume Toprol-XL at this time.  Continue Xarelto 20 mg daily.  Increase midodrine to 10 mg p.o. 3 times daily.  Nearing stability for discharge from a cardiac perspective.  He wants to switch from [facility] to follow with Dr. Tenny Craw in our practice, this can be set up at discharge.  For questions or updates, please contact [facility] Please consult [email or web address] for contact info under   Signed, Nona Dell, MD  10/29/2023, 8:19 AM

## 2023-10-29 NOTE — Progress Notes (Signed)
 Physical Therapy Treatment Patient Details Name: [name] MRN: [id number] DOB: April 27, 1946 Today's Date: 10/29/2023   History of Present Illness [name] is a 78 y.o. male with medical history significant for atrial fibrillation, diastolic CHF, obesity, OSA.   Patient presented to the ED with complaints of low blood pressure.  Reports that started about 3 months ago, he was subsequently taken off his metoprolol with some improvement.  But over the past week he has had recurrence of his low blood pressure, with dizziness feeling like his vision was going black.  Today he had the worst symptoms, feeling like he was going to pass out, but he did not.  Reports chronic poor oral intake, no vomiting or diarrhea.  He is on Lasix 20 mg daily.  Reports some mild chest tightness to the left side of his chest nonradiating yesterday while he was resting and resolved.  He thinks this is related to his cough.  No lower extremity swelling.    PT Comments  Orthostatic hypotension assessed with significant reduction in BP.  Supine 103/42 mmHg HR 79, Sitting 95/43mmHg HR 106, standing: 60/30 mmHg HR 125.  RN aware of status.  Minimal gait training complete to get to head of bed with RW and mod A, heavy UE support on RW.  EOS pt left in bed with call bell within reach.  Reports of increased LBP, RN aware.   If plan is discharge home, recommend the following:     Can travel by private vehicle        Equipment Recommendations       Recommendations for Other Services       Precautions / Restrictions Precautions Precautions: Fall Restrictions Weight Bearing Restrictions Per Provider Order: No     Mobility  Bed Mobility Overal bed mobility: Needs Assistance Bed Mobility: Supine to Sit     Supine to sit: Mod assist, HOB elevated     General bed mobility comments: assistance to pull to seated position. aide helps with this at home    Transfers Overall transfer level: Needs  assistance Equipment used: Rolling walker (2 wheels) Transfers: Sit to/from Stand Sit to Stand: Mod assist, Max assist   Step pivot transfers: Mod assist, Max assist       General transfer comment: Mod A with bed height raised    Ambulation/Gait Ambulation/Gait assistance: Mod assist Gait Distance (Feet): 3 Feet Assistive device: Rolling walker (2 wheels) Gait Pattern/deviations: Step-to pattern, Decreased step length - right, Decreased step length - left, Decreased stride length, Wide base of support Gait velocity: slow     General Gait Details: Sidestep to head to bed   Stairs             Wheelchair Mobility     Tilt Bed    Modified Rankin (Stroke Patients Only)       Balance                                            Communication    Cognition Arousal: Alert Behavior During Therapy: WFL for tasks assessed/performed   PT - Cognitive impairments: No apparent impairments                                Cueing    Exercises      General  Comments        Pertinent Vitals/Pain Pain Assessment Pain Assessment: 0-10 Pain Score: 3  Pain Location: LBP Pain Descriptors / Indicators: Discomfort Pain Intervention(s): RN gave pain meds during session, Monitored during session, Limited activity within patient's tolerance, Repositioned    Home Living                          Prior Function            PT Goals (current goals can now be found in the care plan section)      Frequency           PT Plan      Co-evaluation              AM-PAC PT "6 Clicks" Mobility   Outcome Measure  Help needed turning from your back to your side while in a flat bed without using bedrails?: A Lot Help needed moving from lying on your back to sitting on the side of a flat bed without using bedrails?: A Lot Help needed moving to and from a bed to a chair (including a wheelchair)?: A Lot Help needed standing up  from a chair using your arms (e.g., wheelchair or bedside chair)?: A Lot Help needed to walk in hospital room?: A Lot Help needed climbing 3-5 steps with a railing? : Total 6 Click Score: 11    End of Session Equipment Utilized During Treatment: Gait belt Activity Tolerance: Patient tolerated treatment well;Patient limited by fatigue Patient left: in chair;with call bell/phone within reach Nurse Communication: Mobility status       Time: 8657-8469 PT Time Calculation (min) (ACUTE ONLY): 30 min  Charges:    $Therapeutic Activity: 23-37 mins PT General Charges $$ ACUTE PT VISIT: 1 Visit                     Becky Sax, LPTA/CLT; CBIS [phone]  Juel Burrow 10/29/2023, 3:18 PM

## 2023-10-29 NOTE — Progress Notes (Signed)
 Patient using home CPAP unit independently. Water reservoir full and ready for use. Safety check performed.

## 2023-10-29 NOTE — Progress Notes (Signed)
 PROGRESS NOTE  [name], is a 78 y.o. male, DOB - May 12, 1946, ZOX:[id number]  Admit date - 10/25/2023   Admitting Physician Shon Hale, MD  Outpatient Primary MD for the patient is Pricilla Riffle, MD  LOS - 0  Chief Complaint  Patient presents with   Hypotension      Brief Narrative:  78 y.o. male with medical history significant for atrial fibrillation, diastolic CHF, obesity, OSA.  Admitted on 10/24/2022 with near syncope in the setting of chronic A-fib and chronic diastolic dysfunction CHF... Patient  becomes quite tachy (HR 150s) when he gets up , he is orthostatic and dizzy/symptomatic--    -Assessment and Plan: 1)Near Syncope CT head without acute findings  -likely due to hypotension.  Reports chronic unchanged poor oral intake.  No GI losses.  -Patient was on Lasix PTA -Received IV fluids and midodrine ACTH stimulation test on 10/27/2023--Cortisol 7.5 >>18.8 >>23.8  Echo from [facility] dated 10/01/2023 shows left ventricle size is normal. Wall thickness is normal. Systolic function is low normal. EF: 50-55%. No regional wall motion abnormalities noted. -- Cardiology consult appreciated -Still reporting orthostatic changes and palpitation with activity.  Slowly feeling better. -Per Cardiologist continue digoxin and midodrine (latest 1 with adjustment to 10 mg 3 times a day).Marland Kitchen  2)Chronic atrial fibrillation (HCC) -Hypotension limits ability to use beta-blockers and calcium blockers -Persistent tachycardia with activity and when he stands up leading to dizziness and orthostatic issues. -Per cardiology recommendation continue treatment with digoxin and adjusted dose of midodrine; if needed will require amiodarone treatment. -Patient's primary cardiologist was at [facility] health; per discussion with patient planning to establish care with [facility] cardiology service. -c/n  Xarelto for secondary prevention.  3)Chronic back pain Resume home hydrocodone/acetaminophen  10/325  4) morbid obesity/OSA on CPAP CPAP nightly -Low calorie diet, portion control and increase physical activity discussed with patient -Body mass index is 50.18 kg/m.  5)Chronic diastolic CHF (congestive heart failure) (HCC) Stable and compensated.  Last echo from [facility] on 10/01/23 showed  EF of 50 to 55%. -Continue to hold diuretics at the moment.  Patient demonstrating ability to out of diuresis. -Continue to follow daily weights and strict intake and output.  6)Mild chronic Anemia--- hemoglobin currently close to baseline (above 12) -MCV elevated -Anemia workup noted (folate and B12 are not low, ferritin and serum iron are not low) -No obvious bleeding -Stool Hemoccult test requested and negative.  7)GERD--stable, continue Protonix  Disposition: The patient is from: Home              Anticipated d/c is to: Home              Anticipated d/c date is: 1 day              Patient currently is not medically stable to d/c.  Barriers: Not Clinically Stable-   Code Status :  -  Code Status: Full Code   Family Communication:   NA (patient is alert, awake and coherent)   DVT Prophylaxis  :   - rivaroxaban (XARELTO) tablet 20 mg   Lab Results  Component Value Date   PLT 174 10/28/2023   Inpatient Medications  Scheduled Meds:  digoxin  0.25 mg Oral Daily   escitalopram  10 mg Oral Daily   gabapentin  600 mg Oral Daily   And   gabapentin  300 mg Oral QHS   melatonin  6 mg Oral QHS   midodrine  10 mg Oral TID  WC   pantoprazole  40 mg Oral Daily   rivaroxaban  20 mg Oral QPM   Continuous Infusions: PRN Meds:.acetaminophen **OR** acetaminophen, HYDROcodone-acetaminophen, ondansetron **OR** ondansetron (ZOFRAN) IV, mouth rinse, polyethylene glycol, traZODone   Anti-infectives (From admission, onward)    None       Subjective: [name] no fever, no chest pain, no nausea or vomiting.  Without the use of diuretics continue demonstrating good urine output.   Still experiencing orthostatic changes and elevated heart rates with activity.  Objective: Vitals:   10/28/23 1928 10/29/23 0634 10/29/23 0748 10/29/23 1416  BP:  130/80 129/80 (!) 143/80  Pulse: 98 85  83  Resp:      Temp: 97.8 F (36.6 C) 97.8 F (36.6 C) (!) 97.5 F (36.4 C) (!) 96.9 F (36.1 C)  TempSrc: Oral Oral Oral Axillary  SpO2: 100% 99% 98% 100%  Weight:      Height:        Intake/Output Summary (Last 24 hours) at 10/29/2023 1600 Last data filed at 10/29/2023 1510 Gross per 24 hour  Intake 1300 ml  Output 2100 ml  Net -800 ml   Filed Weights   10/25/23 1426 10/25/23 2013  Weight: (!) 145.2 kg (!) 149.7 kg    Physical Exam General exam: Alert, awake, oriented x 3; no chest pain, no nausea vomiting.  Still experiencing orthostatic vital sign changes when changing positions and having increased heart rate with activity. Respiratory system: No requiring oxygen supplementation. Cardiovascular system: Rate controlled at rest; no rubs or gallops.  Unable to assess JVD with body habitus. Gastrointestinal system: Abdomen is obese, nondistended, soft and nontender. No organomegaly or masses felt. Normal bowel sounds heard. Central nervous system: Moving 4 limbs spontaneously.  No focal neurological deficits. Extremities: No cyanosis or clubbing. Skin: No petechiae. Psychiatry: Judgement and insight appear normal. Mood & affect appropriate.     Data Reviewed: I have personally reviewed following labs and imaging studies  CBC: Recent Labs  Lab 10/25/23 1541 10/27/23 0504 10/28/23 0244  WBC 5.6 4.5 5.7  HGB 12.9* 11.8* 10.8*  HCT 41.1 38.6* 35.3*  MCV 101.5* 101.8* 102.0*  PLT 181 172 174   Basic Metabolic Panel: Recent Labs  Lab 10/25/23 1541 10/27/23 0504 10/28/23 0244  NA 139 137 136  K 4.5 4.6 4.8  CL 102 106 102  CO2 27 24 26   GLUCOSE 102* 91 123*  BUN 21 19 21   CREATININE 1.07 1.15 1.18  CALCIUM 9.3 9.0 8.9   GFR: Estimated Creatinine  Clearance: 74.8 mL/min (by C-G formula based on SCr of 1.18 mg/dL).  Radiology Studies: No results found.  Scheduled Meds:  digoxin  0.25 mg Oral Daily   escitalopram  10 mg Oral Daily   gabapentin  600 mg Oral Daily   And   gabapentin  300 mg Oral QHS   melatonin  6 mg Oral QHS   midodrine  10 mg Oral TID WC   pantoprazole  40 mg Oral Daily   rivaroxaban  20 mg Oral QPM   Continuous Infusions:   LOS: 0 days   Vassie Loll M.D on 10/29/2023 at 4:00 PM  Go to [email or web address] - for contact info  [facility] - Office  [phone]  If 7PM-7AM, please contact night-coverage [email or web address] 10/29/2023, 4:00 PM

## 2023-10-30 DIAGNOSIS — I482 Chronic atrial fibrillation, unspecified: Secondary | ICD-10-CM | POA: Diagnosis not present

## 2023-10-30 DIAGNOSIS — I480 Paroxysmal atrial fibrillation: Secondary | ICD-10-CM | POA: Diagnosis not present

## 2023-10-30 DIAGNOSIS — I5032 Chronic diastolic (congestive) heart failure: Secondary | ICD-10-CM | POA: Diagnosis not present

## 2023-10-30 DIAGNOSIS — I951 Orthostatic hypotension: Secondary | ICD-10-CM | POA: Diagnosis not present

## 2023-10-30 LAB — CBC
HCT: 37.9 % — ABNORMAL LOW (ref 39.0–52.0)
Hemoglobin: 11.7 g/dL — ABNORMAL LOW (ref 13.0–17.0)
MCH: 31.5 pg (ref 26.0–34.0)
MCHC: 30.9 g/dL (ref 30.0–36.0)
MCV: 102.2 fL — ABNORMAL HIGH (ref 80.0–100.0)
Platelets: 172 10*3/uL (ref 150–400)
RBC: 3.71 MIL/uL — ABNORMAL LOW (ref 4.22–5.81)
RDW: 14.1 % (ref 11.5–15.5)
WBC: 4.6 10*3/uL (ref 4.0–10.5)
nRBC: 0 % (ref 0.0–0.2)

## 2023-10-30 LAB — BASIC METABOLIC PANEL
Anion gap: 9 (ref 5–15)
BUN: 17 mg/dL (ref 8–23)
CO2: 25 mmol/L (ref 22–32)
Calcium: 9 mg/dL (ref 8.9–10.3)
Chloride: 104 mmol/L (ref 98–111)
Creatinine, Ser: 1.22 mg/dL (ref 0.61–1.24)
GFR, Estimated: >60 mL/min (ref >60)
Glucose, Bld: 87 mg/dL (ref 70–99)
Potassium: 4.4 mmol/L (ref 3.5–5.1)
Sodium: 138 mmol/L (ref 135–145)

## 2023-10-30 MED ORDER — NYSTATIN 100000 UNIT/GM EX POWD
Freq: Three times a day (TID) | CUTANEOUS | Status: DC
  Filled 2023-10-30 (×2): qty 15

## 2023-10-30 MED ORDER — MIDODRINE HCL 5 MG PO TABS
15.0000 mg | ORAL_TABLET | Freq: Three times a day (TID) | ORAL | Status: DC
  Administered 2023-10-30 – 2023-10-31 (×5): 15 mg via ORAL
  Filled 2023-10-30 (×4): qty 3

## 2023-10-30 MED ORDER — MIDODRINE HCL 5 MG PO TABS: 15.0000 mg | ORAL_TABLET | Freq: Three times a day (TID) | ORAL | Status: DC

## 2023-10-30 MED ORDER — ONDANSETRON HCL 4 MG/2ML IJ SOLN
4.0000 mg | Freq: Once | INTRAMUSCULAR | Status: AC
  Administered 2023-10-30: 4 mg via INTRAVENOUS

## 2023-10-30 NOTE — Care Management Important Message (Signed)
 Important Message  Patient Details  Name: [name] MRN: [id number] Date of Birth: Apr 28, 1946   Important Message Given:  Yes - Medicare IM     Corey Harold 10/30/2023, 11:28 AM

## 2023-10-30 NOTE — Progress Notes (Addendum)
 Pt had one episode of hypotension overnight while he was at rest. Remained in Afib all night, episodes of ventricular bigeminey, and multiple PVC;s. NO acute events overnight. Kellogg RN

## 2023-10-30 NOTE — Plan of Care (Signed)
   Problem: Nutrition: Goal: Adequate nutrition will be maintained Outcome: Progressing   Problem: Coping: Goal: Level of anxiety will decrease Outcome: Progressing   Problem: Pain Managment: Goal: General experience of comfort will improve and/or be controlled Outcome: Progressing

## 2023-10-30 NOTE — Progress Notes (Signed)
 Orthostatic vitals signs performed, pt tolerated well. Pt assisted into recliner for supper. Pt has ted hose on and abd binder was applied prior to pt standing up for last orthostatic reading. Pt's heart rate did increase into the 130-140's after ambulating 5 steps to recliner. Once seated, his heart rate decreased fairly quickly into the 85-90 range, no ectopy noted.  Pt states nausea resolved from this am and that he had a good nap today and feels better. Call bell within reach, pt advised to call for needs, states understanding.

## 2023-10-30 NOTE — Progress Notes (Signed)
 PROGRESS NOTE  [name], is a 78 y.o. male, DOB - February 25, 1946, [id number]  Admit date - 10/25/2023   Admitting Physician Shon Hale, MD  Outpatient Primary MD for the patient is Pricilla Riffle, MD  LOS - 1  Chief Complaint  Patient presents with   Hypotension      Brief Narrative:  78 y.o. male with medical history significant for atrial fibrillation, diastolic CHF, obesity, OSA.  Admitted on 10/24/2022 with near syncope in the setting of chronic A-fib and chronic diastolic dysfunction CHF... Patient  becomes quite tachy (HR 150s) when he gets up , he is orthostatic and dizzy/symptomatic--    -Assessment and Plan: 1)Near Syncope CT head without acute findings  -likely due to hypotension.  Reports chronic unchanged poor oral intake.  No GI losses.  -Patient was on Lasix PTA -Received IV fluids and midodrine ACTH stimulation test on 10/27/2023--Cortisol 7.5 >>18.8 >>23.8  Echo from [facility] dated 10/01/2023 shows left ventricle size is normal. Wall thickness is normal. Systolic function is low normal. EF: 50-55%. No regional wall motion abnormalities noted. -- Cardiology consult appreciated -Still reporting orthostatic changes and palpitation with activity.  Slowly feeling better. -Per Cardiologist continue digoxin and midodrine (latest 1 with adjusted to 15 mg 3 times a day as per cardiology recommendations).Marland Kitchen  2)Chronic atrial fibrillation (HCC) -Hypotension limits ability to use beta-blockers and calcium blockers -Persistent tachycardia with activity and when he stands up leading to dizziness and orthostatic issues. -Per cardiology recommendation continue treatment with digoxin and adjusted dose of midodrine; if needed will require amiodarone treatment. -Patient's primary cardiologist was at [facility] health; per discussion with patient planning to establish care with [facility] cardiology service. -c/n  Xarelto for secondary prevention.  3)Chronic back pain Resume home  hydrocodone/acetaminophen 10/325  4) morbid obesity/OSA on CPAP CPAP nightly -Low calorie diet, portion control and increase physical activity discussed with patient -Body mass index is 50.18 kg/m.  5)Chronic diastolic CHF (congestive heart failure) (HCC) Stable and compensated.  Last echo from [facility] on 10/01/23 showed  EF of 50 to 55%. -Continue to hold diuretics at the moment.  Patient demonstrating ability to out of diuresis. -Continue to follow daily weights and strict intake and output.  6)Mild chronic Anemia--- hemoglobin currently close to baseline (above 12) -MCV elevated -Anemia workup noted (folate and B12 are not low, ferritin and serum iron are not low) -No obvious bleeding -Stool Hemoccult test requested and negative.  7)GERD--stable, continue Protonix  Disposition: The patient is from: Home              Anticipated d/c is to: Home              Anticipated d/c date is: 1 day              Patient currently is not medically stable to d/c.  Barriers: Not Clinically Stable-   Code Status :  -  Code Status: Full Code   Family Communication:   NA (patient is alert, awake and coherent)   DVT Prophylaxis  :   - rivaroxaban (XARELTO) tablet 20 mg   Lab Results  Component Value Date   PLT 172 10/30/2023   Inpatient Medications  Scheduled Meds:  digoxin  0.25 mg Oral Daily   escitalopram  10 mg Oral Daily   gabapentin  600 mg Oral Daily   And   gabapentin  300 mg Oral QHS   melatonin  6 mg Oral QHS   midodrine  15 mg Oral TID WC   pantoprazole  40 mg Oral Daily   rivaroxaban  20 mg Oral QPM   Continuous Infusions: PRN Meds:.acetaminophen **OR** acetaminophen, HYDROcodone-acetaminophen, ondansetron **OR** ondansetron (ZOFRAN) IV, mouth rinse, polyethylene glycol, traZODone   Anti-infectives (From admission, onward)    None       Subjective: [name] no fever, no chest pain, no nausea or vomiting.  Still symptomatic when changing position and  exerting himself.  Positive orthostatic changes appreciated.  Nonsustained atrial fibrillation with activity.  Objective: Vitals:   10/30/23 0000 10/30/23 0115 10/30/23 0240 10/30/23 0400  BP: (!) 89/53  (!) 98/55 103/64  Pulse:      Resp: 17 19 19    Temp:      TempSrc:      SpO2:      Weight:      Height:        Intake/Output Summary (Last 24 hours) at 10/30/2023 1035 Last data filed at 10/30/2023 0300 Gross per 24 hour  Intake 360 ml  Output 1375 ml  Net -1015 ml   Filed Weights   10/25/23 1426 10/25/23 2013  Weight: (!) 145.2 kg (!) 149.7 kg    Physical Exam General exam: Alert, awake, oriented x 3; asymptomatic while resting.  When changing positions or exerting himself reports dizziness and associated palpitations. Respiratory system: Good saturation on room air. Cardiovascular system: Rate controlled at rest; no rubs, no gallops, unable to assess JVD with body habitus. Gastrointestinal system: Abdomen is obese, nondistended, soft and nontender. No organomegaly or masses felt. Normal bowel sounds heard. Central nervous system: No focal neurological deficits. Extremities: No cyanosis or clubbing. Skin: No petechiae. Psychiatry: Judgement and insight appear normal. Mood & affect appropriate.   Data Reviewed: I have personally reviewed following labs and imaging studies  CBC: Recent Labs  Lab 10/25/23 1541 10/27/23 0504 10/28/23 0244 10/30/23 0847  WBC 5.6 4.5 5.7 4.6  HGB 12.9* 11.8* 10.8* 11.7*  HCT 41.1 38.6* 35.3* 37.9*  MCV 101.5* 101.8* 102.0* 102.2*  PLT 181 172 174 172   Basic Metabolic Panel: Recent Labs  Lab 10/25/23 1541 10/27/23 0504 10/28/23 0244 10/30/23 0847  NA 139 137 136 138  K 4.5 4.6 4.8 4.4  CL 102 106 102 104  CO2 27 24 26 25   GLUCOSE 102* 91 123* 87  BUN 21 19 21 17   CREATININE 1.07 1.15 1.18 1.22  CALCIUM 9.3 9.0 8.9 9.0   GFR: Estimated Creatinine Clearance: 72.4 mL/min (by C-G formula based on SCr of 1.22  mg/dL).  Radiology Studies: No results found.  Scheduled Meds:  digoxin  0.25 mg Oral Daily   escitalopram  10 mg Oral Daily   gabapentin  600 mg Oral Daily   And   gabapentin  300 mg Oral QHS   melatonin  6 mg Oral QHS   midodrine  15 mg Oral TID WC   pantoprazole  40 mg Oral Daily   rivaroxaban  20 mg Oral QPM   Continuous Infusions:   LOS: 1 day   Vassie Loll M.D on 10/30/2023 at 10:35 AM  Go to [email or web address] - for contact info  [facility] - Office  [phone]  If 7PM-7AM, please contact night-coverage [email or web address] 10/30/2023, 10:35 AM

## 2023-10-30 NOTE — Progress Notes (Incomplete)
Attending Note  Patient seen and discussed with PA Further, I agree with her documentation.    1.Orthostatic hypotension - stim test appropriate response without evidence of adrenal insufficiency - check HgbA1c - plans for outpatient considerations for amyloid given echo LVH, low voltage, neuropathy, orthostatic hypotension. Can order inpatient SPEP,upep, serum free light chains. Consider outpatient cMRI vs PYP scan.  - on midodrine 10mg  tid just increased yesterday. Recurrent dizziness with standing this morning to go to restroom. Max reported dosing on midodrine is 15mg  tid. He has not had any supine hypertension, will increase dose.  - Repeat orthostatics today to reassess numbers and symptoms - order thigh high stockings and abdominal binder   2.Afib - rate controlled as outpatient, history is somewhat unclear if any prior attempts to restore SR  - rate control difficult due to orthostatic hypotension. His beta blocker was stopped.  - currently on digoxin - he is on xarelto for stroke prevention - rates reasonable, with activity likely some tachycardia related to his orthostatic changes.    3. Chronic HFpEF - Jan 2022 echo: LVE 50-55%, mild LVH, low normal RV function - from notes intolerant to SGLT2i due to GI symptoms - appears euvolemic      Signed, Dina Rich, MD  10/30/2023, 9:25 AM

## 2023-10-30 NOTE — Progress Notes (Addendum)
 Patient Name: [name] Date of Encounter: 10/30/2023 [facility] Cardiologist: Dr. Tenny Craw  Interval Summary  .    Patient reports persistent orthostasis when he stands up. Midodrine increased yesterday. Afib rates overall OK.  Vital Signs .    Vitals:   10/30/23 0000 10/30/23 0115 10/30/23 0240 10/30/23 0400  BP: (!) 89/53  (!) 98/55 103/64  Pulse:      Resp: 17 19 19    Temp:      TempSrc:      SpO2:      Weight:      Height:        Intake/Output Summary (Last 24 hours) at 10/30/2023 0821 Last data filed at 10/30/2023 0300 Gross per 24 hour  Intake 580 ml  Output 1375 ml  Net -795 ml      10/25/2023    8:13 PM 10/25/2023    2:26 PM 09/28/2020    4:35 AM  Last 3 Weights  Weight (lbs) 330 lb 0.5 oz 320 lb 304 lb 7.3 oz  Weight (kg) 149.7 kg 145.151 kg 138.1 kg      Telemetry/ECG    Afib HR 80-90s, up to 120s at times - Personally Reviewed  Physical Exam .   GEN: No acute distress.   Neck: No JVD Cardiac: Irreg IRreg, no murmurs, rubs, or gallops.  Respiratory: Clear to auscultation bilaterally. GI: Soft, nontender, non-distended  MS: No edema  Assessment & Plan .    Orthostatic Hypotension - Echo showed LVEF 50-55% at outside hospital - orthostatics positive yesterday - adrenal insufficiency excluded - midodrine increased to 10mg  TID - repeat orthostatics today - will order thigh high stockings and abdominal binder - may need fludrocortisone if he continues to be orthostatic  Permanent Afib - CHADSVASC of 4 - he is on digoxin 0.25mg  daily - Xarelto 20mg  daily for stroke ppx - do not think he will tolerate BB  Chronic HFpEF - LVEF 50-55% - PTA lasix held - has been self-diuresing - h/o intolerance to SGLT2i due to GI upset  OSA  - continue CPAP  Chronic Anemia - Hgb 10.8  - stool hemoccult negative  For questions or updates, please contact [facility] Please consult [email or web address] for contact info under         Signed, Cadence David Stall, PA-C    Attending Note  Patient seen and discussed with PA Further, I agree with her documentation.    1.Orthostatic hypotension - stim test appropriate response without evidence of adrenal insufficiency - check HgbA1c - plans for outpatient considerations for amyloid given echo LVH, low voltage, neuropathy, orthostatic hypotension. Can order inpatient SPEP,upep, serum free light chains. Consider outpatient cMRI vs PYP scan.  - on midodrine 10mg  tid just increased yesterday. Recurrent dizziness with standing this morning to go to restroom. Max reported dosing on midodrine is 15mg  tid. He has not had any supine hypertension, will increase dose.  - Repeat orthostatics today to reassess numbers and symptoms - order thigh high stockings and abdominal binder   2.Afib - rate controlled as outpatient, history is somewhat unclear if any prior attempts to restore SR  - rate control difficult due to orthostatic hypotension. His beta blocker was stopped.  - currently on digoxin - he is on xarelto for stroke prevention - rates reasonable, with activity likely some tachycardia related to his orthostatic changes.    3. Chronic HFpEF - Jan 2022 echo: LVE 50-55%, mild LVH, low normal RV function - from notes  intolerant to SGLT2i due to GI symptoms - appears euvolemic      Signed, Dina Rich, MD  10/30/2023, 9:25 AM

## 2023-10-31 DIAGNOSIS — I951 Orthostatic hypotension: Secondary | ICD-10-CM

## 2023-10-31 DIAGNOSIS — M549 Dorsalgia, unspecified: Secondary | ICD-10-CM | POA: Diagnosis not present

## 2023-10-31 DIAGNOSIS — I482 Chronic atrial fibrillation, unspecified: Secondary | ICD-10-CM | POA: Diagnosis not present

## 2023-10-31 DIAGNOSIS — I5032 Chronic diastolic (congestive) heart failure: Secondary | ICD-10-CM | POA: Diagnosis not present

## 2023-10-31 DIAGNOSIS — G4733 Obstructive sleep apnea (adult) (pediatric): Secondary | ICD-10-CM | POA: Diagnosis not present

## 2023-10-31 LAB — HEMOGLOBIN A1C
Hgb A1c MFr Bld: 5.6 % (ref 4.8–5.6)
Mean Plasma Glucose: 114.02 mg/dL

## 2023-10-31 MED ORDER — LEVALBUTEROL TARTRATE 45 MCG/ACT IN AERO: 2.0000 | INHALATION_SPRAY | Freq: Four times a day (QID) | RESPIRATORY_TRACT | 2 refills | Status: DC | PRN

## 2023-10-31 MED ORDER — GABAPENTIN 300 MG PO CAPS: 300.0000 mg | ORAL_CAPSULE | Freq: Two times a day (BID) | ORAL | Status: DC

## 2023-10-31 MED ORDER — DIGOXIN 250 MCG PO TABS: 0.2500 mg | ORAL_TABLET | Freq: Every day | ORAL | 1 refills | Status: DC

## 2023-10-31 MED ORDER — MIDODRINE HCL 5 MG PO TABS
15.0000 mg | ORAL_TABLET | Freq: Three times a day (TID) | ORAL | 2 refills | Status: DC
Start: 2023-10-31 — End: 2023-11-09

## 2023-10-31 NOTE — Discharge Summary (Signed)
 Physician Discharge Summary   Patient: [name] MRN: [id number] DOB: 1945-10-20  Admit date:     10/25/2023  Discharge date: 10/31/23  Discharge Physician: Vassie Loll   PCP: Pricilla Riffle, MD   Recommendations at discharge:  Reassess blood pressure and adjust medication as needed Outpatient follow-up with cardiology service for further adjustment into rate control agents. Repeat basic metabolic panel to follow ultralights renal function Repeat CBC to follow hemoglobin trend/stability.  Discharge Diagnoses: Principal Problem:   Near syncope Active Problems:   Chronic atrial fibrillation (HCC)   Hypertension   Chronic diastolic CHF (congestive heart failure) (HCC)   OSA on CPAP   Chronic back pain   Orthostatic hypotension  Brief Hospital admission narrative: As per H&P written by Dr. Mariea Clonts on 10/25/2023 [name] is a 78 y.o. male with medical history significant for atrial fibrillation, diastolic CHF, obesity, OSA.  Patient presented to the ED with complaints of low blood pressure.  Reports that started about 3 months ago, he was subsequently taken off his metoprolol with some improvement.  But over the past week he has had recurrence of his low blood pressure, with dizziness feeling like his vision was going black.  Today he had the worst symptoms, feeling like he was going to pass out, but he did not.  Reports chronic poor oral intake, no vomiting or diarrhea.  He is on Lasix 20 mg daily. Reports some mild chest tightness to the left side of his chest nonradiating yesterday while he was resting and resolved.  He thinks this is related to his cough.  No lower extremity swelling.   ED Course: Tmax 98.3.  Heart rate 96-117.  Respirate rate 14-30.  Blood pressure systolic down to 29/56 improved with fluid bolus.  Sats mostly 91 to 97% on room air.   Troponin 5 >7.  EKG shows chronic A-fib, without changes. Lactic acid 1.7 >>2. 1 L bolus given. Chest x-ray without acute  abnormality.  Head CT negative for acute abnormality. EDP talked to cardiology, continue to admit here. Midodrine resumed.   Assessment and Plan: 1)Near Syncope CT head without acute findings  -likely due to hypotension.  Reports chronic unchanged poor oral intake.  No GI losses.  -Patient was on Lasix PTA -Received IV fluids and midodrine ACTH stimulation test on 10/27/2023--Cortisol 7.5 >>18.8 >>23.8  Echo from [facility] dated 10/01/2023 shows left ventricle size is normal. Wall thickness is normal. Systolic function is low normal. EF: 50-55%. No regional wall motion abnormalities noted. -- Cardiology consult appreciated -Still reporting orthostatic changes and palpitation with activity.  Slowly feeling better. -Per Cardiologist continue digoxin and midodrine (latest 1 with adjusted to 15 mg 3 times a day with good response and much better control of his orthostatic changes.   2)Chronic atrial fibrillation (HCC) -Hypotension limits ability to use beta-blockers and calcium blockers -Persistent tachycardia with activity and when he stands up leading to dizziness and orthostatic issues. -Per cardiology recommendation continue treatment with digoxin and adjusted dose of midodrine; if needed will require amiodarone treatment. -Patient's primary cardiologist was at [facility]; per discussion with patient planning to establish care with [facility] cardiology service. -c/n  Xarelto for secondary prevention.   3)Chronic back pain -Resume home hydrocodone/acetaminophen 10/325   4) morbid obesity/OSA on CPAP -Continue CPAP nightly -Low calorie diet, portion control and increase physical activity discussed with patient -Body mass index is 50.18 kg/m.   5)Chronic diastolic CHF (congestive heart failure) (HCC) Stable and compensated.  Last echo from [facility] on 10/01/23 showed  EF of 50 to 55%. -Continue to hold diuretics at the moment.  Patient demonstrating ability to  aauto-diurese. -Continue to follow daily weights, low-sodium diet and adequate hydration. -Euvolemic at discharge.   6)Mild chronic Anemia--- hemoglobin currently close to baseline (above 12) -MCV elevated -Anemia workup noted (folate and B12 are not low, ferritin and serum iron are not low) -No obvious bleeding -Stool Hemoccult test requested and negative.   7)GERD--stable, continue Protonix  Consultants: Cardiology service Procedures performed: See below for x-ray reports. Disposition: Home with home health services. Diet recommendation: Heart healthy/low-sodium diet and low calorie diet.  DISCHARGE MEDICATION: Allergies as of 10/31/2023       Reactions   Bee Venom Anaphylaxis, Other (See Comments)   Noted from [facility]. honey bee venom   Nickel Rash, Other (See Comments)   "Breaks me out"        Medication List     STOP taking these medications    albuterol 108 (90 Base) MCG/ACT inhaler Commonly known as: VENTOLIN HFA   cyclobenzaprine 5 MG tablet Commonly known as: FLEXERIL   ferrous sulfate 325 (65 FE) MG tablet   furosemide 20 MG tablet Commonly known as: LASIX   NYQUIL PO       TAKE these medications    alum & mag hydroxide-simeth 200-200-20 MG/5ML suspension Commonly known as: MAALOX/MYLANTA Take 15 mLs by mouth every 6 (six) hours as needed for indigestion or heartburn.   CENTRUM ADULTS PO Take 1 tablet by mouth every evening.   digoxin 0.25 MG tablet Commonly known as: LANOXIN Take 1 tablet (0.25 mg total) by mouth daily. Start taking on: November 01, 2023   Ensure Max Protein Liqd Take 330 mLs (11 oz total) by mouth 2 (two) times daily.   escitalopram 10 MG tablet Commonly known as: LEXAPRO Take 1 tablet (10 mg total) by mouth daily.   gabapentin 300 MG capsule Commonly known as: NEURONTIN Take 1 capsule (300 mg total) by mouth 2 (two) times daily. What changed: when to take this   HYDROcodone-acetaminophen 10-325 MG tablet Commonly  known as: NORCO Take 0.5-1 tablets by mouth every 6 (six) hours as needed for moderate pain (pain score 4-6).   levalbuterol 45 MCG/ACT inhaler Commonly known as: XOPENEX HFA Inhale 2 puffs into the lungs every 6 (six) hours as needed for wheezing or shortness of breath.   melatonin 3 MG Tabs tablet Take 2 tablets (6 mg total) by mouth at bedtime. What changed:  how much to take additional instructions   midodrine 5 MG tablet Commonly known as: PROAMATINE Take 3 tablets (15 mg total) by mouth 3 (three) times daily with meals.   pantoprazole 40 MG tablet Commonly known as: PROTONIX Take 1 tablet (40 mg total) by mouth daily.   promethazine 25 MG tablet Commonly known as: PHENERGAN Take 25 mg by mouth every 6 (six) hours as needed for nausea or vomiting.   traZODone 50 MG tablet Commonly known as: DESYREL Take 1 tablet (50 mg total) by mouth at bedtime as needed for sleep.   Xarelto 20 MG Tabs tablet Generic drug: rivaroxaban Take 20 mg by mouth every evening.        Follow-up Information     [facility] Follow up.   Why: For home health physical therapy.        Pricilla Riffle, MD. Schedule an appointment as soon as possible for a visit in 10 day(s).  Specialty: Cardiology Contact information: [address] [place] [address] [phone]                Discharge Exam: [name] Weights   10/25/23 1426 10/25/23 2013  Weight: (!) 145.2 kg (!) 149.7 kg   General exam: Alert, awake, oriented x 3; asymptomatic at rest; today no dizziness or lightheadedness while standing and changing positions/walking.  For the most part orthostatic vital signs much improved and once again demonstrating no sustained tachycardia with exertion.  Patient feel comfortable going home today. Respiratory system: Good saturation on room air. Cardiovascular system: Rate controlled at rest; no rubs, no gallops, unable to assess JVD with body habitus. Gastrointestinal  system: Abdomen is obese, nondistended, soft and nontender. No organomegaly or masses felt. Normal bowel sounds heard. Central nervous system: No focal neurological deficits. Extremities: No cyanosis or clubbing. Skin: No petechiae. Psychiatry: Judgement and insight appear normal. Mood & affect appropriate.   Condition at discharge: Stable and improved.  The results of significant diagnostics from this hospitalization (including imaging, microbiology, ancillary and laboratory) are listed below for reference.   Imaging Studies: CT Head Wo Contrast Result Date: 10/25/2023 CLINICAL DATA:  Syncope/presyncope, cerebrovascular cause suspected EXAM: CT HEAD WITHOUT CONTRAST TECHNIQUE: Contiguous axial images were obtained from the base of the skull through the vertex without intravenous contrast. RADIATION DOSE REDUCTION: This exam was performed according to the departmental dose-optimization program which includes automated exposure control, adjustment of the mA and/or kV according to patient size and/or use of iterative reconstruction technique. COMPARISON:  None Available. FINDINGS: Brain: No evidence of acute infarction, hemorrhage, hydrocephalus, extra-axial collection or mass lesion/mass effect. Patchy white matter hypodensities are nonspecific but compatible with chronic microvascular ischemic change. Vascular: No mastoid effusions. Skull: No acute fracture. Sinuses/Orbits: Right maxillary sinus mucosal thickening no acute orbital findings. Other: No mastoid effusions. IMPRESSION: No evidence of acute intracranial abnormality. Electronically Signed   By: Feliberto Harts M.D.   On: 10/25/2023 15:17   DG Chest Port 1 View Result Date: 10/25/2023 CLINICAL DATA:  Syncope, chest pain, hypotension EXAM: PORTABLE CHEST 1 VIEW COMPARISON:  09/25/2020 FINDINGS: 2 frontal views of the chest demonstrate a stable enlarged cardiac silhouette. Continued ectasia and atherosclerosis of the thoracic aorta. There is  chronic scarring within the left perihilar region, with diffuse left-sided pleural calcifications unchanged. No acute airspace disease, effusion, or pneumothorax. No acute bony abnormalities. IMPRESSION: 1. Chronic left pleural calcifications and left perihilar scarring. No acute intrathoracic process. Electronically Signed   By: Sharlet Salina M.D.   On: 10/25/2023 15:07    Microbiology: Results for orders placed or performed during the hospital encounter of 09/25/20  SARS Coronavirus 2 by RT PCR (hospital order, performed in [facility] hospital lab) Nasopharyngeal Nasopharyngeal Swab     Status: Abnormal   Collection Time: 09/25/20  4:45 PM   Specimen: Nasopharyngeal Swab  Result Value Ref Range Status   SARS Coronavirus 2 POSITIVE (A) NEGATIVE Final    Comment: RESULT CALLED TO, READ BACK BY AND VERIFIED WITH: BRANDY OAKLEY @1813  09/25/20 BY JONES,T (NOTE) SARS-CoV-2 target nucleic acids are DETECTED  SARS-CoV-2 RNA is generally detectable in upper respiratory specimens  during the acute phase of infection.  Positive results are indicative  of the presence of the identified virus, but do not rule out bacterial infection or co-infection with other pathogens not detected by the test.  Clinical correlation with patient history and  other diagnostic information is necessary to determine patient infection status.  The expected result is negative.  Fact Sheet for Patients:   BoilerBrush.com.cy   Fact Sheet for Healthcare Providers:   https://pope.com/    This test is not yet approved or cleared by the [place] FDA and  has been authorized for detection and/or diagnosis of SARS-CoV-2 by FDA under an Emergency Use Authorization (EUA).  This EUA will remain in effect (meaning thi s test can be used) for the duration of  the COVID-19 declaration under Section 564(b)(1) of the Act, 21 U.S.C. section 360-bbb-3(b)(1), unless the  authorization is terminated or revoked sooner.  Performed at [facility], [address], [place], [place] [address]   Gastrointestinal Panel by PCR , Stool     Status: None   Collection Time: 09/26/20  3:26 PM   Specimen: Stool  Result Value Ref Range Status   Campylobacter species NOT DETECTED NOT DETECTED Final   Plesimonas shigelloides NOT DETECTED NOT DETECTED Final   Salmonella species NOT DETECTED NOT DETECTED Final   Yersinia enterocolitica NOT DETECTED NOT DETECTED Final   Vibrio species NOT DETECTED NOT DETECTED Final   Vibrio cholerae NOT DETECTED NOT DETECTED Final   Enteroaggregative E coli (EAEC) NOT DETECTED NOT DETECTED Final   Enteropathogenic E coli (EPEC) NOT DETECTED NOT DETECTED Final   Enterotoxigenic E coli (ETEC) NOT DETECTED NOT DETECTED Final   Shiga like toxin producing E coli (STEC) NOT DETECTED NOT DETECTED Final   Shigella/Enteroinvasive E coli (EIEC) NOT DETECTED NOT DETECTED Final   Cryptosporidium NOT DETECTED NOT DETECTED Final   Cyclospora cayetanensis NOT DETECTED NOT DETECTED Final   Entamoeba histolytica NOT DETECTED NOT DETECTED Final   Giardia lamblia NOT DETECTED NOT DETECTED Final   Adenovirus F40/41 NOT DETECTED NOT DETECTED Final   Astrovirus NOT DETECTED NOT DETECTED Final   Norovirus GI/GII NOT DETECTED NOT DETECTED Final   Rotavirus A NOT DETECTED NOT DETECTED Final   Sapovirus (I, II, IV, and V) NOT DETECTED NOT DETECTED Final    Comment: Performed at [facility], [address] [place], [place] [address]    Labs: CBC: Recent Labs  Lab 10/25/23 1541 10/27/23 0504 10/28/23 0244 10/30/23 0847  WBC 5.6 4.5 5.7 4.6  HGB 12.9* 11.8* 10.8* 11.7*  HCT 41.1 38.6* 35.3* 37.9*  MCV 101.5* 101.8* 102.0* 102.2*  PLT 181 172 174 172   Basic Metabolic Panel: Recent Labs  Lab 10/25/23 1541 10/27/23 0504 10/28/23 0244 10/30/23 0847  NA 139 137 136 138  K 4.5 4.6 4.8 4.4  CL 102 106 102 104  CO2 27 24 26 25   GLUCOSE  102* 91 123* 87  BUN 21 19 21 17   CREATININE 1.07 1.15 1.18 1.22  CALCIUM 9.3 9.0 8.9 9.0   Liver Function Tests: No results for input(s): "AST", "ALT", "ALKPHOS", "BILITOT", "PROT", "ALBUMIN" in the last 168 hours. CBG: No results for input(s): "GLUCAP" in the last 168 hours.  Discharge time spent: greater than 30 minutes.  Signed: Vassie Loll, MD [facility] 10/31/2023

## 2023-10-31 NOTE — Progress Notes (Signed)
 Pt A&O, ate 100% of breakfast. Sitting up in bed, denies c/o at present. Male wick remains in place to keep pt's skin dry due to severe excoriation of perineum and MASD of groin. Pt states that excoriated areas feel much better this am. 24 hour urine collection in progress. Advised pt of plan of care for the day in regards to OOB/activity/mobility expectations and pt agreeable.

## 2023-10-31 NOTE — Plan of Care (Signed)

## 2023-10-31 NOTE — Progress Notes (Signed)
 24 HR urine started

## 2023-10-31 NOTE — Progress Notes (Signed)
 Pt discharged via WC to POV. Discharge instructions reviewed with pt and caregiver, both state understanding.

## 2023-11-02 LAB — KAPPA/LAMBDA LIGHT CHAINS
Kappa free light chain: 48.7 mg/L — ABNORMAL HIGH (ref 3.3–19.4)
Kappa, lambda light chain ratio: 1.21 (ref 0.26–1.65)
Lambda free light chains: 40.3 mg/L — ABNORMAL HIGH (ref 5.7–26.3)

## 2023-11-03 ENCOUNTER — Telehealth

## 2023-11-04 ENCOUNTER — Other Ambulatory Visit: Payer: Self-pay

## 2023-11-04 DIAGNOSIS — R799 Abnormal finding of blood chemistry, unspecified: Secondary | ICD-10-CM

## 2023-11-04 DIAGNOSIS — D649 Anemia, unspecified: Secondary | ICD-10-CM

## 2023-11-04 DIAGNOSIS — R109 Unspecified abdominal pain: Secondary | ICD-10-CM

## 2023-11-05 ENCOUNTER — Emergency Department (HOSPITAL_COMMUNITY)

## 2023-11-05 ENCOUNTER — Encounter (HOSPITAL_COMMUNITY): Payer: Self-pay

## 2023-11-05 ENCOUNTER — Inpatient Hospital Stay (HOSPITAL_COMMUNITY)
Admission: EM | Admit: 2023-11-05 | Discharge: 2023-11-09 | DRG: 312 | Disposition: A | Discharge status: Home Health | Attending: Internal Medicine | Admitting: Internal Medicine

## 2023-11-05 ENCOUNTER — Other Ambulatory Visit: Payer: Self-pay

## 2023-11-05 DIAGNOSIS — Z811 Family history of alcohol abuse and dependence: Secondary | ICD-10-CM

## 2023-11-05 DIAGNOSIS — I482 Chronic atrial fibrillation, unspecified: Secondary | ICD-10-CM | POA: Diagnosis present

## 2023-11-05 DIAGNOSIS — F1120 Opioid dependence, uncomplicated: Secondary | ICD-10-CM

## 2023-11-05 DIAGNOSIS — D539 Nutritional anemia, unspecified: Secondary | ICD-10-CM | POA: Diagnosis present

## 2023-11-05 DIAGNOSIS — I700 Atherosclerosis of aorta: Secondary | ICD-10-CM | POA: Diagnosis present

## 2023-11-05 DIAGNOSIS — M170 Bilateral primary osteoarthritis of knee: Secondary | ICD-10-CM | POA: Diagnosis present

## 2023-11-05 DIAGNOSIS — G8929 Other chronic pain: Secondary | ICD-10-CM | POA: Diagnosis present

## 2023-11-05 DIAGNOSIS — Z87891 Personal history of nicotine dependence: Secondary | ICD-10-CM

## 2023-11-05 DIAGNOSIS — Z91030 Bee allergy status: Secondary | ICD-10-CM

## 2023-11-05 DIAGNOSIS — K219 Gastro-esophageal reflux disease without esophagitis: Secondary | ICD-10-CM | POA: Diagnosis present

## 2023-11-05 DIAGNOSIS — J9811 Atelectasis: Secondary | ICD-10-CM | POA: Diagnosis present

## 2023-11-05 DIAGNOSIS — R55 Syncope and collapse: Secondary | ICD-10-CM | POA: Diagnosis present

## 2023-11-05 DIAGNOSIS — R6883 Chills (without fever): Secondary | ICD-10-CM | POA: Diagnosis present

## 2023-11-05 DIAGNOSIS — I951 Orthostatic hypotension: Principal | ICD-10-CM | POA: Diagnosis present

## 2023-11-05 DIAGNOSIS — Z809 Family history of malignant neoplasm, unspecified: Secondary | ICD-10-CM

## 2023-11-05 DIAGNOSIS — E860 Dehydration: Secondary | ICD-10-CM

## 2023-11-05 DIAGNOSIS — I5033 Acute on chronic diastolic (congestive) heart failure: Secondary | ICD-10-CM | POA: Diagnosis present

## 2023-11-05 DIAGNOSIS — Z6841 Body Mass Index (BMI) 40.0 and over, adult: Secondary | ICD-10-CM

## 2023-11-05 DIAGNOSIS — E872 Acidosis, unspecified: Secondary | ICD-10-CM | POA: Insufficient documentation

## 2023-11-05 DIAGNOSIS — N179 Acute kidney failure, unspecified: Secondary | ICD-10-CM

## 2023-11-05 DIAGNOSIS — Z7901 Long term (current) use of anticoagulants: Secondary | ICD-10-CM

## 2023-11-05 DIAGNOSIS — R000 Tachycardia, unspecified: Secondary | ICD-10-CM | POA: Diagnosis present

## 2023-11-05 DIAGNOSIS — Z1152 Encounter for screening for COVID-19: Secondary | ICD-10-CM

## 2023-11-05 DIAGNOSIS — G4733 Obstructive sleep apnea (adult) (pediatric): Secondary | ICD-10-CM

## 2023-11-05 DIAGNOSIS — I4821 Permanent atrial fibrillation: Secondary | ICD-10-CM | POA: Diagnosis present

## 2023-11-05 DIAGNOSIS — E869 Volume depletion, unspecified: Secondary | ICD-10-CM | POA: Diagnosis present

## 2023-11-05 DIAGNOSIS — Z96643 Presence of artificial hip joint, bilateral: Secondary | ICD-10-CM | POA: Diagnosis present

## 2023-11-05 DIAGNOSIS — Z79899 Other long term (current) drug therapy: Secondary | ICD-10-CM

## 2023-11-05 DIAGNOSIS — R531 Weakness: Principal | ICD-10-CM

## 2023-11-05 DIAGNOSIS — M7702 Medial epicondylitis, left elbow: Secondary | ICD-10-CM | POA: Diagnosis present

## 2023-11-05 DIAGNOSIS — I5032 Chronic diastolic (congestive) heart failure: Secondary | ICD-10-CM | POA: Diagnosis present

## 2023-11-05 DIAGNOSIS — Z91048 Other nonmedicinal substance allergy status: Secondary | ICD-10-CM

## 2023-11-05 DIAGNOSIS — M549 Dorsalgia, unspecified: Secondary | ICD-10-CM | POA: Diagnosis present

## 2023-11-05 LAB — CBC WITH DIFFERENTIAL/PLATELET
Abs Immature Granulocytes: 0.03 10*3/uL (ref 0.00–0.07)
Basophils Absolute: 0 10*3/uL (ref 0.0–0.1)
Basophils Relative: 0 %
Eosinophils Absolute: 0 10*3/uL (ref 0.0–0.5)
Eosinophils Relative: 0 %
HCT: 41.2 % (ref 39.0–52.0)
Hemoglobin: 12.9 g/dL — ABNORMAL LOW (ref 13.0–17.0)
Immature Granulocytes: 0 %
Lymphocytes Relative: 8 %
Lymphs Abs: 0.7 10*3/uL (ref 0.7–4.0)
MCH: 31.6 pg (ref 26.0–34.0)
MCHC: 31.3 g/dL (ref 30.0–36.0)
MCV: 101 fL — ABNORMAL HIGH (ref 80.0–100.0)
Monocytes Absolute: 0.9 10*3/uL (ref 0.1–1.0)
Monocytes Relative: 11 %
Neutro Abs: 6.2 10*3/uL (ref 1.7–7.7)
Neutrophils Relative %: 81 %
Platelets: 229 10*3/uL (ref 150–400)
RBC: 4.08 MIL/uL — ABNORMAL LOW (ref 4.22–5.81)
RDW: 14.6 % (ref 11.5–15.5)
WBC: 7.8 10*3/uL (ref 4.0–10.5)
nRBC: 0 % (ref 0.0–0.2)

## 2023-11-05 LAB — COMPREHENSIVE METABOLIC PANEL
ALT: 16 U/L (ref 0–44)
AST: 29 U/L (ref 15–41)
Albumin: 3.5 g/dL (ref 3.5–5.0)
Alkaline Phosphatase: 93 U/L (ref 38–126)
Anion gap: 14 (ref 5–15)
BUN: 26 mg/dL — ABNORMAL HIGH (ref 8–23)
CO2: 22 mmol/L (ref 22–32)
Calcium: 9.6 mg/dL (ref 8.9–10.3)
Chloride: 102 mmol/L (ref 98–111)
Creatinine, Ser: 1.33 mg/dL — ABNORMAL HIGH (ref 0.61–1.24)
GFR, Estimated: 55 mL/min — ABNORMAL LOW (ref >60)
Glucose, Bld: 134 mg/dL — ABNORMAL HIGH (ref 70–99)
Potassium: 4.3 mmol/L (ref 3.5–5.1)
Sodium: 138 mmol/L (ref 135–145)
Total Bilirubin: 1.1 mg/dL (ref 0.0–1.2)
Total Protein: 8 g/dL (ref 6.5–8.1)

## 2023-11-05 LAB — DIGOXIN LEVEL: Digoxin Level: 0.7 ng/mL — ABNORMAL LOW (ref 0.8–2.0)

## 2023-11-05 LAB — LACTIC ACID, PLASMA
Lactic Acid, Venous: 3.2 mmol/L (ref 0.5–1.9)
Lactic Acid, Venous: 2.1 mmol/L (ref 0.5–1.9)

## 2023-11-05 MED ORDER — LACTATED RINGERS IV BOLUS
1000.0000 mL | Freq: Once | INTRAVENOUS | Status: AC
  Administered 2023-11-05: 1000 mL via INTRAVENOUS

## 2023-11-05 MED ORDER — FENTANYL CITRATE PF 50 MCG/ML IJ SOSY
50.0000 ug | PREFILLED_SYRINGE | Freq: Once | INTRAMUSCULAR | Status: AC
  Administered 2023-11-05: 50 ug via INTRAVENOUS
  Filled 2023-11-05: qty 1

## 2023-11-05 MED ORDER — IOHEXOL 300 MG/ML  SOLN
100.0000 mL | Freq: Once | INTRAMUSCULAR | Status: AC | PRN
  Administered 2023-11-05: 100 mL via INTRAVENOUS

## 2023-11-05 NOTE — Telephone Encounter (Signed)
 Called and spoke to patient to verify his message. Pt states that he has been having symptoms since yesterday. States that he is experiencing chills, shaking, sweating, dizziness, and blood pressures as low as 86/64. Pt reports that he was been in the hospital previously for the same symptoms. Pt reports that he is unable to get out of bed due to feeling so weak. Pt reports that he has been like this all night. It was noticed on the phone that patient was having some delay in responding to questions. Pt advised to call 911. Pt reports that he is going to wait for his caregiver to get to his home at 11 AM. Pt advised that he does not need to wait and needs to move forward with calling 911. Pt stated again that he is going to wait. Offered to call 911 for patient and he declined.

## 2023-11-05 NOTE — ED Notes (Signed)
 Pt hollering at this RN while trying to Triage other Pts in the waiting area. Pt being disruptive and demanding I give him another phone call. AP AC Notified. Security notified of Pt.

## 2023-11-05 NOTE — ED Triage Notes (Signed)
 Pt arrived via EMS from home c/o recurrent weakness, fatigue, muscle pain, chills, difficulty walking and extremity weakness.

## 2023-11-05 NOTE — ED Triage Notes (Signed)
 Prior to beginning Triage, Pt adamant about calling his ex-wife to let someone know he is here at [facility], due to Pt not having his cell phone.

## 2023-11-05 NOTE — ED Provider Notes (Signed)
 [place] EMERGENCY DEPARTMENT AT [facility] Provider Note   CSN: [id number] Arrival date & time: 11/05/23  1235     History  Chief Complaint  Patient presents with   Weakness    [name] is a 78 y.o. male.   Weakness Patient presents with weakness chills abdominal pain unable to get up.  Recent admission for same.  States he is relapsed.  Had been in the waiting room around 7.5 hours before getting to see me.  I reviewed note from recent discharge and recent cardiology visit.  States he has had some abdominal pain.  Had been doing better when he left the hospital on the first, states he got worse over the last couple days.  States he has not had his medicines today since he has been here.  Had been on midodrine 3 times a day.    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 Authorizing Provider  alum & mag hydroxide-simeth (MAALOX/MYLANTA) 200-200-20 MG/5ML suspension Take 15 mLs by mouth every 6 (six) hours as needed for indigestion or heartburn.    [provider]  digoxin (LANOXIN) 0.25 MG tablet Take 1 tablet (0.25 mg total) by mouth daily. 11/01/23    Vassie Loll, MD  Ensure Max Protein (ENSURE MAX PROTEIN) LIQD Take 330 mLs (11 oz total) by mouth 2 (two) times daily. 12/07/18   Leroy Sea, MD  escitalopram (LEXAPRO) 10 MG tablet Take 1 tablet (10 mg total) by mouth daily. 09/02/18   Hongalgi, Maximino Greenland, MD  gabapentin (NEURONTIN) 300 MG capsule Take 1 capsule (300 mg total) by mouth 2 (two) times daily. 10/31/23   Vassie Loll, MD  HYDROcodone-acetaminophen [facility]) 10-325 MG tablet Take 0.5-1 tablets by mouth every 6 (six) hours as needed for moderate pain (pain score 4-6). 08/17/23   [provider]  levalbuterol Pauline Aus HFA) 45 MCG/ACT inhaler Inhale 2 puffs into the lungs every 6 (six) hours as needed for wheezing or shortness of breath. 10/31/23 10/30/24  Vassie Loll, MD  Melatonin 3 MG TABS Take 2 tablets (6 mg total) by mouth at bedtime. Patient taking differently: Take 3 mg by mouth at bedtime. Plus 20 mg in another supplement 09/11/18   Osvaldo Shipper, MD  midodrine (PROAMATINE) 5 MG tablet Take 3 tablets (15 mg total) by mouth 3 (three) times daily with meals. 10/31/23 01/29/24  Vassie Loll, MD  Multiple Vitamins-Minerals (CENTRUM ADULTS PO) Take 1 tablet by mouth every evening.     [provider]  pantoprazole (PROTONIX) 40 MG tablet Take 1 tablet (40 mg total) by  mouth daily. 09/02/18   Hongalgi, Maximino Greenland, MD  promethazine (PHENERGAN) 25 MG tablet Take 25 mg by mouth every 6 (six) hours as needed for nausea or vomiting. 09/22/23   [provider]  traZODone (DESYREL) 50 MG tablet Take 1 tablet (50 mg total) by mouth at bedtime as needed for sleep. 11/26/18   Dorcas Carrow, MD  XARELTO 20 MG TABS tablet Take 20 mg by mouth every evening. 09/18/20   [provider]      Allergies    Bee venom and Nickel    Review of Systems   Review of Systems  Neurological:  Positive for weakness.    Physical Exam Updated Vital Signs BP (!) 104/53   Pulse 95   Temp 98.2 F (36.8 C) (Temporal)   Resp 16    Ht 5\' 8"  (1.727 m)   Wt (!) 149.7 kg   SpO2 96%   BMI 50.18 kg/m  Physical Exam Vitals and nursing note reviewed.  Constitutional:      Appearance: He is obese.  HENT:     Head: Normocephalic.  Eyes:     Pupils: Pupils are equal, round, and reactive to light.  Cardiovascular:     Rate and Rhythm: Regular rhythm.  Abdominal:     Tenderness: There is abdominal tenderness.     Comments: Epigastric tenderness without rebound or guarding.  Musculoskeletal:     Right lower leg: Edema present.     Left lower leg: Edema present.  Skin:    Capillary Refill: Capillary refill takes less than 2 seconds.  Neurological:     Mental Status: He is alert and oriented to person, place, and time.     ED Results / Procedures / Treatments   Labs (all labs ordered are listed, but only abnormal results are displayed) Labs Reviewed  COMPREHENSIVE METABOLIC PANEL - Abnormal; Notable for the following components:      Result Value   Glucose, Bld 134 (*)    BUN 26 (*)    Creatinine, Ser 1.33 (*)    GFR, Estimated 55 (*)    All other components within normal limits  CBC WITH DIFFERENTIAL/PLATELET - Abnormal; Notable for the following components:   RBC 4.08 (*)    Hemoglobin 12.9 (*)    MCV 101.0 (*)    All other components within normal limits  LACTIC ACID, PLASMA - Abnormal; Notable for the following components:   Lactic Acid, Venous 3.2 (*)    All other components within normal limits  DIGOXIN LEVEL - Abnormal; Notable for the following components:   Digoxin Level 0.7 (*)    All other components within normal limits  CULTURE, BLOOD (ROUTINE X 2)  CULTURE, BLOOD (ROUTINE X 2)  LACTIC ACID, PLASMA    EKG None  Radiology DG Chest Portable 1 View Result Date: 11/05/2023 CLINICAL DATA:  Shortness of breath and dizziness. EXAM: PORTABLE CHEST 1 VIEW COMPARISON:  October 25, 2023. FINDINGS: The cardiac silhouette is enlarged and unchanged in size. Stable, chronic left perihilar scarring  is seen. Mild scarring, atelectasis and/or infiltrate is also noted within the bilateral lung bases. Small bilateral pleural effusions are suspected with small pleural based calcifications seen along the periphery of the left lower lobe. No pneumothorax is identified. Multilevel degenerative changes seen throughout the thoracic spine. IMPRESSION: 1. Stable cardiomegaly with mild bibasilar scarring, atelectasis and/or infiltrate. 2. Small bilateral pleural effusions. Electronically Signed   By: Aram Candela M.D.   On: 11/05/2023 23:10  Procedures Procedures    Medications Ordered in ED Medications  lactated ringers bolus 1,000 mL (has no administration in time range)  fentaNYL (SUBLIMAZE) injection 50 mcg (50 mcg Intravenous Given 11/05/23 2201)  iohexol (OMNIPAQUE) 300 MG/ML solution 100 mL (100 mLs Intravenous Contrast Given 11/05/23 2215)    ED Course/ Medical Decision Making/ A&P                                 Medical Decision Making Amount and/or Complexity of Data Reviewed Labs: ordered. Radiology: ordered.  Risk Prescription drug management.   Patient generalized wEakness and hypotension.  Reportedly too weak to get up.  Recent admission for same.  Had hypotension.  Reviewed discharge note.  Has not had medicines today.  States had chills.  Difficulty walking.  Has a caregiver at home.  Differential diagnoses long but includes ration, infection.  Also with abdominal pain had outpatient CT scan scheduled that I will get here.  Blood work shows mildly elevated creatinine.  CT scan still pending.  Care turned over to Dr. Posey Rea        Final Clinical Impression(s) / ED Diagnoses Final diagnoses:  Weakness    Rx / DC Orders ED Discharge Orders     None         Benjiman Core, MD 11/05/23 2326

## 2023-11-05 NOTE — ED Notes (Signed)
 Pt reports being advised to return to the ED for further treatment and evaluation due to recent blood work results. Pt reports concern for possible MS.

## 2023-11-06 ENCOUNTER — Observation Stay (HOSPITAL_COMMUNITY)

## 2023-11-06 DIAGNOSIS — D539 Nutritional anemia, unspecified: Secondary | ICD-10-CM

## 2023-11-06 DIAGNOSIS — Z6841 Body Mass Index (BMI) 40.0 and over, adult: Secondary | ICD-10-CM | POA: Diagnosis not present

## 2023-11-06 DIAGNOSIS — I951 Orthostatic hypotension: Secondary | ICD-10-CM | POA: Diagnosis present

## 2023-11-06 DIAGNOSIS — K219 Gastro-esophageal reflux disease without esophagitis: Secondary | ICD-10-CM

## 2023-11-06 DIAGNOSIS — Z79899 Other long term (current) drug therapy: Secondary | ICD-10-CM | POA: Diagnosis not present

## 2023-11-06 DIAGNOSIS — I4821 Permanent atrial fibrillation: Secondary | ICD-10-CM | POA: Diagnosis present

## 2023-11-06 DIAGNOSIS — I700 Atherosclerosis of aorta: Secondary | ICD-10-CM | POA: Diagnosis present

## 2023-11-06 DIAGNOSIS — Z91048 Other nonmedicinal substance allergy status: Secondary | ICD-10-CM | POA: Diagnosis not present

## 2023-11-06 DIAGNOSIS — E872 Acidosis, unspecified: Secondary | ICD-10-CM | POA: Diagnosis present

## 2023-11-06 DIAGNOSIS — Z809 Family history of malignant neoplasm, unspecified: Secondary | ICD-10-CM | POA: Diagnosis not present

## 2023-11-06 DIAGNOSIS — N179 Acute kidney failure, unspecified: Secondary | ICD-10-CM | POA: Diagnosis not present

## 2023-11-06 DIAGNOSIS — Z87891 Personal history of nicotine dependence: Secondary | ICD-10-CM | POA: Diagnosis not present

## 2023-11-06 DIAGNOSIS — M170 Bilateral primary osteoarthritis of knee: Secondary | ICD-10-CM | POA: Diagnosis present

## 2023-11-06 DIAGNOSIS — E869 Volume depletion, unspecified: Secondary | ICD-10-CM | POA: Diagnosis present

## 2023-11-06 DIAGNOSIS — I482 Chronic atrial fibrillation, unspecified: Secondary | ICD-10-CM | POA: Diagnosis not present

## 2023-11-06 DIAGNOSIS — Z91030 Bee allergy status: Secondary | ICD-10-CM | POA: Diagnosis not present

## 2023-11-06 DIAGNOSIS — Z811 Family history of alcohol abuse and dependence: Secondary | ICD-10-CM | POA: Diagnosis not present

## 2023-11-06 DIAGNOSIS — G4733 Obstructive sleep apnea (adult) (pediatric): Secondary | ICD-10-CM

## 2023-11-06 DIAGNOSIS — Z7901 Long term (current) use of anticoagulants: Secondary | ICD-10-CM | POA: Diagnosis not present

## 2023-11-06 DIAGNOSIS — I5032 Chronic diastolic (congestive) heart failure: Secondary | ICD-10-CM

## 2023-11-06 DIAGNOSIS — I4891 Unspecified atrial fibrillation: Secondary | ICD-10-CM | POA: Diagnosis not present

## 2023-11-06 DIAGNOSIS — E860 Dehydration: Secondary | ICD-10-CM | POA: Diagnosis present

## 2023-11-06 DIAGNOSIS — F1120 Opioid dependence, uncomplicated: Secondary | ICD-10-CM

## 2023-11-06 DIAGNOSIS — R55 Syncope and collapse: Secondary | ICD-10-CM | POA: Diagnosis not present

## 2023-11-06 DIAGNOSIS — J9811 Atelectasis: Secondary | ICD-10-CM | POA: Diagnosis present

## 2023-11-06 DIAGNOSIS — R531 Weakness: Secondary | ICD-10-CM | POA: Insufficient documentation

## 2023-11-06 DIAGNOSIS — M7702 Medial epicondylitis, left elbow: Secondary | ICD-10-CM | POA: Diagnosis present

## 2023-11-06 DIAGNOSIS — Z1152 Encounter for screening for COVID-19: Secondary | ICD-10-CM | POA: Diagnosis not present

## 2023-11-06 DIAGNOSIS — G8929 Other chronic pain: Secondary | ICD-10-CM | POA: Diagnosis present

## 2023-11-06 LAB — VITAMIN B12: Vitamin B-12: 471 pg/mL (ref 180–914)

## 2023-11-06 LAB — URINALYSIS, W/ REFLEX TO CULTURE (INFECTION SUSPECTED)
Bacteria, UA: NONE SEEN
Bilirubin Urine: NEGATIVE
Glucose, UA: NEGATIVE mg/dL
Hgb urine dipstick: NEGATIVE
Ketones, ur: NEGATIVE mg/dL
Leukocytes,Ua: NEGATIVE
Nitrite: NEGATIVE
Protein, ur: NEGATIVE mg/dL
Specific Gravity, Urine: 1.03 (ref 1.005–1.030)
pH: 7 (ref 5.0–8.0)

## 2023-11-06 LAB — LACTIC ACID, PLASMA
Lactic Acid, Venous: 0.9 mmol/L (ref 0.5–1.9)
Lactic Acid, Venous: 1.1 mmol/L (ref 0.5–1.9)

## 2023-11-06 LAB — PROCALCITONIN: Procalcitonin: 0.16 ng/mL

## 2023-11-06 LAB — CK: Total CK: 54 U/L (ref 49–397)

## 2023-11-06 LAB — RESP PANEL BY RT-PCR (RSV, FLU A&B, COVID)  RVPGX2
Influenza A by PCR: NEGATIVE
Influenza B by PCR: NEGATIVE
Resp Syncytial Virus by PCR: NEGATIVE
SARS Coronavirus 2 by RT PCR: NEGATIVE

## 2023-11-06 LAB — FOLATE: Folate: 26.8 ng/mL (ref >5.9)

## 2023-11-06 MED ORDER — ONDANSETRON HCL 4 MG/2ML IJ SOLN
4.0000 mg | Freq: Four times a day (QID) | INTRAMUSCULAR | Status: DC | PRN
Start: 2023-11-06 — End: 2023-11-09
  Administered 2023-11-07 – 2023-11-08 (×3): 4 mg via INTRAVENOUS
  Filled 2023-11-06 (×3): qty 2

## 2023-11-06 MED ORDER — MORPHINE SULFATE (PF) 4 MG/ML IV SOLN
4.0000 mg | Freq: Once | INTRAVENOUS | Status: AC
  Administered 2023-11-06: 4 mg via INTRAVENOUS
  Filled 2023-11-06: qty 1

## 2023-11-06 MED ORDER — ONDANSETRON HCL 4 MG PO TABS: 4.0000 mg | ORAL_TABLET | Freq: Four times a day (QID) | ORAL | Status: DC | PRN

## 2023-11-06 MED ORDER — MIDODRINE HCL 5 MG PO TABS
15.0000 mg | ORAL_TABLET | Freq: Three times a day (TID) | ORAL | Status: DC
  Administered 2023-11-06: 15 mg via ORAL
  Filled 2023-11-06: qty 3

## 2023-11-06 MED ORDER — MIDODRINE HCL 5 MG PO TABS
10.0000 mg | ORAL_TABLET | Freq: Three times a day (TID) | ORAL | Status: DC
  Administered 2023-11-06: 10 mg via ORAL
  Filled 2023-11-06: qty 2

## 2023-11-06 MED ORDER — LACTATED RINGERS IV SOLN: INTRAVENOUS | Status: DC

## 2023-11-06 MED ORDER — HYDROCODONE-ACETAMINOPHEN 10-325 MG PO TABS
1.0000 | ORAL_TABLET | ORAL | Status: DC | PRN
  Administered 2023-11-06: 1 via ORAL
  Filled 2023-11-06: qty 1

## 2023-11-06 MED ORDER — ACETAMINOPHEN 325 MG PO TABS: 650.0000 mg | ORAL_TABLET | Freq: Four times a day (QID) | ORAL | Status: DC | PRN

## 2023-11-06 MED ORDER — PANTOPRAZOLE SODIUM 40 MG PO TBEC
40.0000 mg | DELAYED_RELEASE_TABLET | Freq: Every day | ORAL | Status: DC
  Administered 2023-11-06 – 2023-11-07 (×2): 40 mg via ORAL
  Filled 2023-11-06 (×2): qty 1

## 2023-11-06 MED ORDER — DIGOXIN 125 MCG PO TABS
0.2500 mg | ORAL_TABLET | Freq: Every day | ORAL | Status: DC
  Administered 2023-11-06 – 2023-11-09 (×4): 0.25 mg via ORAL
  Filled 2023-11-06 (×4): qty 2

## 2023-11-06 MED ORDER — ONDANSETRON HCL 4 MG/2ML IJ SOLN
4.0000 mg | Freq: Once | INTRAMUSCULAR | Status: AC
  Administered 2023-11-06: 4 mg via INTRAVENOUS
  Filled 2023-11-06: qty 2

## 2023-11-06 MED ORDER — MELATONIN 3 MG PO TABS
6.0000 mg | ORAL_TABLET | Freq: Every day | ORAL | Status: DC
  Administered 2023-11-06 – 2023-11-08 (×3): 6 mg via ORAL
  Filled 2023-11-06 (×4): qty 2

## 2023-11-06 MED ORDER — RIVAROXABAN 20 MG PO TABS
20.0000 mg | ORAL_TABLET | Freq: Every day | ORAL | Status: DC
  Administered 2023-11-06 – 2023-11-08 (×3): 20 mg via ORAL
  Filled 2023-11-06 (×3): qty 1

## 2023-11-06 MED ORDER — MELATONIN 5 MG PO TABS
5.0000 mg | ORAL_TABLET | Freq: Once | ORAL | Status: DC
  Filled 2023-11-06: qty 1

## 2023-11-06 MED ORDER — MIDODRINE HCL 5 MG PO TABS
20.0000 mg | ORAL_TABLET | Freq: Three times a day (TID) | ORAL | Status: DC
  Administered 2023-11-07 – 2023-11-09 (×8): 20 mg via ORAL
  Filled 2023-11-06 (×8): qty 4

## 2023-11-06 MED ORDER — ACETAMINOPHEN 650 MG RE SUPP: 650.0000 mg | Freq: Four times a day (QID) | RECTAL | Status: DC | PRN

## 2023-11-06 MED ORDER — HYDROCODONE-ACETAMINOPHEN 10-325 MG PO TABS
0.5000 | ORAL_TABLET | Freq: Four times a day (QID) | ORAL | Status: DC | PRN
  Administered 2023-11-06 – 2023-11-07 (×3): 1 via ORAL
  Filled 2023-11-06 (×3): qty 1

## 2023-11-06 NOTE — H&P (Addendum)
 History and Physical    Patient: [name] ZOX:[id number] DOB: [birth year] DOA: 11/05/2023 DOS: the patient was seen and examined on 11/06/2023 PCP: Medicine, [facility]  Patient coming from: Home  Chief Complaint:  Chief Complaint  Patient presents with   Weakness   HPI: [name] is a 78 y.o. male with medical history significant of diastolic CHF, atrial fibrillation, OSA on CPAP, morbid obesity who presents to the emergency department due to low blood pressure, dizziness, chills, weakness, fatigue, having sensation of going to pass out (but did not pass out). He was recently admitted from 2/23 to 3/1 due to near syncope, he was noted to have orthostatic changes and palpitation at that time, cardiologist was consulted and recommended continuing digoxin and midodrine.  Patient states that symptoms resolved for a few days after last discharge, but his symptoms have since returned..  ED Course:  In the emergency department, BP was 104/53, other vital signs were within normal range.  Workup in the ED showed macrocytic anemia, BMP was normal except for blood glucose of 134, BUN/creatinine 26/1.33 (baseline creatinine at 1.1-1.2), lactic acid 3.2 > 2.1.  Blood culture pending. CT abdomen and pelvis with contrast showed no acute intra-abdominal pathology identified. Chest x-ray showed stable cardiomegaly with mild bibasilar scarring, atelectasis and/or infiltrate.  Small bilateral pleural effusions He was treated with IV fentanyl and morphine, Zofran was given.  IV hydration was provided. Hospitalist was asked admit patient for further evaluation and management.  Review of Systems: Review of systems as noted in the HPI. All other systems reviewed and are negative.   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   Past Surgical History:  Procedure Laterality Date   APPENDECTOMY     ARTERY BIOPSY Left 12/30/2017   Procedure: BIOPSY TEMPORAL ARTERY;  Surgeon: Claud Kelp, MD;  Location: [facility] ORS;  Service: General;  Laterality: Left;   CHOLECYSTECTOMY     COLONOSCOPY W/ POLYPECTOMY     ESOPHAGOGASTRODUODENOSCOPY (EGD) WITH PROPOFOL  07/06/2012   Procedure: ESOPHAGOGASTRODUODENOSCOPY (EGD) WITH PROPOFOL;  Surgeon: Petra Kuba, MD;  Location: [facility] ENDOSCOPY;  Service: Endoscopy;  Laterality: N/A;   ESOPHAGOSCOPY     EYE SURGERY     left eye- muscle repair   HERNIA REPAIR     ventral hernia   INCISION AND DRAINAGE HIP Left 03/17/2018   Procedure: IRRIGATION AND DEBRIDEMENT LEFT THIGH WOUND;  Surgeon: Ollen Gross, MD;  Location: [facility] ORS;  Service: Orthopedics;  Laterality: Left;   JOINT REPLACEMENT     bilateral hips.  Right broke and had to be re placed again   MASS EXCISION N/A 03/02/2015   Procedure: EXCISION ABDOMINAL WALL  MASS;  Surgeon: Ovidio Kin, MD;  Location: [facility] OR;  Service: General;  Laterality: N/A;   TOTAL HIP REVISION Left 02/17/2018   Procedure: Left total hip arthroplasty revision;  Surgeon: Ollen Gross, MD;  Location: [facility] ORS;  Service: Orthopedics;  Laterality: Left;   VERTICAL BANDED GASTROPLASTY      Social History:  reports that he quit smoking about 39 years ago. His smoking use included cigarettes. He started smoking  about 57 years ago. He has a 35.6 pack-year smoking history. He has never used smokeless tobacco. He reports that he does not currently use alcohol. He reports that he does not use drugs.   Allergies  Allergen Reactions   Bee Venom Anaphylaxis and Other (See Comments)    Noted from [place]. honey bee venom   Nickel Rash and Other (See Comments)    "Breaks me out"    Family History  Problem Relation Age of Onset   Cancer Mother    Alcohol abuse Father      Prior to Admission medications   Medication Sig Start Date End Date Taking? Authorizing Provider  alum & mag hydroxide-simeth (MAALOX/MYLANTA) 200-200-20 MG/5ML suspension Take 15 mLs by mouth every 6 (six) hours as needed for indigestion or heartburn.    [provider]  digoxin (LANOXIN) 0.25 MG tablet Take 1 tablet (0.25 mg total) by mouth daily. 11/01/23   Vassie Loll, MD  Ensure Max Protein (ENSURE MAX PROTEIN) LIQD Take 330 mLs (11 oz total) by mouth 2 (two) times daily. 12/07/18   Leroy Sea, MD  escitalopram (LEXAPRO) 10 MG tablet Take 1 tablet (10 mg total) by mouth daily. 09/02/18   Hongalgi, Maximino Greenland, MD  gabapentin (NEURONTIN) 300 MG capsule Take 1 capsule (300 mg total) by mouth 2 (two) times daily. 10/31/23   Vassie Loll, MD  HYDROcodone-acetaminophen [facility]) 10-325 MG tablet Take 0.5-1 tablets by mouth every 6 (six) hours as needed for moderate pain (pain score 4-6). 08/17/23   [provider]  levalbuterol Pauline Aus HFA) 45 MCG/ACT inhaler Inhale 2 puffs into the lungs every 6 (six) hours as needed for wheezing or shortness of breath. 10/31/23 10/30/24  Vassie Loll, MD  Melatonin 3 MG TABS Take 2 tablets (6 mg total) by mouth at bedtime. Patient taking differently: Take 3 mg by mouth at bedtime. Plus 20 mg in another supplement 09/11/18   Osvaldo Shipper, MD  midodrine (PROAMATINE) 5 MG tablet Take 3 tablets (15 mg total) by mouth 3 (three) times daily with meals. 10/31/23 01/29/24  Vassie Loll, MD   Multiple Vitamins-Minerals (CENTRUM ADULTS PO) Take 1 tablet by mouth every evening.     [provider]  pantoprazole (PROTONIX) 40 MG tablet Take 1 tablet (40 mg total) by mouth daily. 09/02/18   Hongalgi, Maximino Greenland, MD  promethazine (PHENERGAN) 25 MG tablet Take 25 mg by mouth every 6 (six) hours as needed for nausea or vomiting. 09/22/23   [provider]  traZODone (DESYREL) 50 MG tablet Take 1 tablet (50 mg total) by mouth at bedtime as needed for sleep. 11/26/18   Dorcas Carrow, MD  XARELTO 20 MG TABS tablet Take 20 mg by mouth every evening. 09/18/20   [provider]    Physical Exam: BP 108/62   Pulse 82   Temp 98.2 F (36.8 C) (Temporal)   Resp 15   Ht 5\' 8"  (1.727 m)   Wt (!) 149.7 kg   SpO2 93%   BMI 50.18 kg/m  General: 78 y.o. year-old male well developed well nourished in no acute distress.  Alert and oriented x3. HEENT: NCAT, EOMI Neck: Supple, trachea medial Cardiovascular: Regular rate and rhythm with no rubs or gallops.  No thyromegaly or JVD noted.  No lower extremity edema. 2/4 pulses in all 4 extremities. Respiratory: Clear to auscultation with no wheezes or rales. Good inspiratory effort. Abdomen: Soft, obese, nontender nondistended with normal bowel sounds x4 quadrants. Muskuloskeletal: No cyanosis, clubbing or edema noted bilaterally Neuro: CN II-XII intact, strength 5/5 x 4, sensation, reflexes intact Skin: No ulcerative lesions noted or rashes Psychiatry: Judgement and insight appear normal. Mood is appropriate for condition and setting          Labs on Admission:  Basic Metabolic Panel: Recent Labs  Lab 10/30/23 0847 11/05/23 2047  NA 138 138  K 4.4 4.3  CL 104 102  CO2 25 22  GLUCOSE 87 134*  BUN 17 26*  CREATININE 1.22 1.33*  CALCIUM 9.0 9.6   Liver Function Tests: Recent Labs  Lab 11/05/23 2047  AST 29  ALT 16  ALKPHOS 93  BILITOT 1.1  PROT 8.0  ALBUMIN 3.5   No results for input(s): "LIPASE", "AMYLASE"  in the last 168 hours. No results for input(s): "AMMONIA" in the last 168 hours. CBC: Recent Labs  Lab 10/30/23 0847 11/05/23 2047  WBC 4.6 7.8  NEUTROABS  --  6.2  HGB 11.7* 12.9*  HCT 37.9* 41.2  MCV 102.2* 101.0*  PLT 172 229   Cardiac Enzymes: No results for input(s): "CKTOTAL", "CKMB", "CKMBINDEX", "TROPONINI" in the last 168 hours.  BNP (last 3 results) Recent Labs    10/25/23 1541  BNP 148.0*    ProBNP (last 3 results) No results for input(s): "PROBNP" in the last 8760 hours.  CBG: No results for input(s): "GLUCAP" in the last 168 hours.  Radiological Exams on Admission: CT ABDOMEN PELVIS W CONTRAST Result Date: 11/06/2023 CLINICAL DATA:  Acute nonlocalized abdominal pain EXAM: CT ABDOMEN AND PELVIS WITH CONTRAST TECHNIQUE: Multidetector CT imaging of the abdomen and pelvis was performed using the standard protocol following bolus administration of intravenous contrast. RADIATION DOSE REDUCTION: This exam was performed according to the departmental dose-optimization program which includes automated exposure control, adjustment of the mA and/or kV according to patient size and/or use of iterative reconstruction technique. CONTRAST:  OMNIPAQUE IOHEXOL 300 MG/ML  SOLN COMPARISON:  01/09/2015 FINDINGS: Lower chest: Pleural thickening and calcifications seen at the lung bases bilaterally are in keeping with bilateral fibrothorax. Superimposed bibasilar parenchymal scarring is seen. Mild coronary artery calcification. Global cardiac size within limits. Hepatobiliary: Status post cholecystectomy. Mild intrahepatic and moderate extrahepatic biliary ductal dilation is unchanged from prior examination likely representing post cholecystectomy change. Liver unremarkable., Pancreas: Unremarkable Spleen: Unremarkable Adrenals/Urinary Tract: The adrenal glands are unremarkable. There is mild bilateral progressive renal cortical atrophy. Kidneys are normal in position. No hydronephrosis.  No intrarenal or ureteral calculi. Simple cortical cyst noted arising from the lower pole the right kidney for which no follow-up imaging is recommended. Bladder is partially obscured by streak artifact however the visualized portion is unremarkable. Stomach/Bowel: Surgical changes of Roux-en-Y gastric bypass are identified. Appendix is absent. The stomach, small bowel, and large bowel are otherwise unremarkable. No evidence of obstruction or focal inflammation. No free intraperitoneal gas or fluid. Vascular/Lymphatic: Aortic atherosclerosis. No enlarged abdominal or pelvic lymph nodes. Reproductive: Obscured by streak artifact. Other: No abdominal wall hernia Musculoskeletal: Status post bilateral total hip arthroplasty. Osseous structures are  age-appropriate. IMPRESSION: 1. No acute intra-abdominal pathology identified. No definite radiographic explanation for the patient's reported symptoms. 2. Bilateral fibrothorax. 3. Mild coronary artery calcification. 4. Surgical changes of Roux-en-Y gastric bypass. 5. Mild bilateral progressive renal cortical atrophy. Aortic Atherosclerosis (ICD10-I70.0). Electronically Signed   By: Helyn Numbers M.D.   On: 11/06/2023 00:52   DG Chest Portable 1 View Result Date: 11/05/2023 CLINICAL DATA:  Shortness of breath and dizziness. EXAM: PORTABLE CHEST 1 VIEW COMPARISON:  October 25, 2023. FINDINGS: The cardiac silhouette is enlarged and unchanged in size. Stable, chronic left perihilar scarring is seen. Mild scarring, atelectasis and/or infiltrate is also noted within the bilateral lung bases. Small bilateral pleural effusions are suspected with small pleural based calcifications seen along the periphery of the left lower lobe. No pneumothorax is identified. Multilevel degenerative changes seen throughout the thoracic spine. IMPRESSION: 1. Stable cardiomegaly with mild bibasilar scarring, atelectasis and/or infiltrate. 2. Small bilateral pleural effusions. Electronically Signed    By: Aram Candela M.D.   On: 11/05/2023 23:10    EKG: I independently viewed the EKG done and my findings are as followed: Atrial fibrillation  Assessment/Plan Present on Admission:  Macrocytic anemia  Chronic atrial fibrillation (HCC)  Chronic back pain  Chronic diastolic CHF (congestive heart failure) (HCC)  Gastroesophageal reflux disease  Principal Problem:   Generalized weakness Active Problems:   Chronic atrial fibrillation (HCC)   Macrocytic anemia   Morbid obesity with BMI of 50.0-59.9, adult (HCC)   Chronic diastolic CHF (congestive heart failure) (HCC)   OSA on CPAP   Chronic back pain   Gastroesophageal reflux disease   Lactic acidosis  Near syncope This was possibly due to hypotension.  He has a history of chronic unchanged poor oral intake Continue telemetry and watch for arrhythmias EKG personally reviewed showed atrial fibrillation Echocardiogram from [facility] dated 10/01/2023 shows left ventricle size is normal. Wall thickness is normal. Systolic function is low normal. EF: 50-55%. No regional wall motion abnormalities noted per medical record.  Carotid artery Dopplers will be done to rule out hemodynamically significant stenosis Continue midodrine   Lactic acidosis possibly due to hypotension and subsequent poor perfusion Lactic acidosis 3.2 > 2.1 Continue to trend lactic acid  Macrocytic anemia MCV 101.0, hemoglobin 12.9 Vitamin B12 and folate levels will be checked  Chronic atrial fibrillation Echocardiogram personally reviewed showed atrial fibrillation Continue digoxin, Xarelto   Chronic back pain Continue home hydrocodone/acetaminophen 10/325   Morbid obesity/OSA on CPAP Continue CPAP nightly Continue low calorie diet, portion control and increase physical activity discussed with patient Body mass index is 50.18 kg/m.   Chronic diastolic CHF (congestive heart failure)  Stable and compensated.  Last echo from [facility] on  10/01/23 showed  EF of 50 to 55%. Continue to follow daily weights, low-sodium diet and adequate hydration.   GERD Continue Protonix    DVT prophylaxis: Xarelto  Code Status: Full code  Family Communication: None at bedside  Consults: None  Severity of Illness: The appropriate patient status for this patient is INPATIENT. Inpatient status is judged to be reasonable and necessary in order to provide the required intensity of service to ensure the patient's safety. The patient's presenting symptoms, physical exam findings, and initial radiographic and laboratory data in the context of their chronic comorbidities is felt to place them at high risk for further clinical deterioration. Furthermore, it is not anticipated that the patient will be medically stable for discharge from the hospital within 2 midnights of admission.   *  I certify that at the point of admission it is my clinical judgment that the patient will require inpatient hospital care spanning beyond 2 midnights from the point of admission due to high intensity of service, high risk for further deterioration and high frequency of surveillance required.*  Author: Frankey Shown, DO 11/06/2023 6:53 AM  For on call review [email or web address].

## 2023-11-06 NOTE — ED Provider Notes (Signed)
  Physical Exam  BP 123/62   Pulse 99   Temp 98.2 F (36.8 C) (Temporal)   Resp 18   Ht 5\' 8"  (1.727 m)   Wt (!) 149.7 kg   SpO2 94%   BMI 50.18 kg/m   Physical Exam Constitutional:      General: He is not in acute distress.    Appearance: Normal appearance.  HENT:     Head: Normocephalic and atraumatic.     Nose: No congestion or rhinorrhea.  Eyes:     General:        Right eye: No discharge.        Left eye: No discharge.     Extraocular Movements: Extraocular movements intact.     Pupils: Pupils are equal, round, and reactive to light.  Cardiovascular:     Rate and Rhythm: Normal rate and regular rhythm.     Heart sounds: No murmur heard. Pulmonary:     Effort: No respiratory distress.     Breath sounds: No wheezing or rales.  Abdominal:     General: There is no distension.     Tenderness: There is no abdominal tenderness.  Musculoskeletal:        General: Normal range of motion.     Cervical back: Normal range of motion.  Skin:    General: Skin is warm and dry.  Neurological:     General: No focal deficit present.     Mental Status: He is alert.     Procedures  Procedures  ED Course / MDM    Medical Decision Making Amount and/or Complexity of Data Reviewed Labs: ordered. Radiology: ordered.  Risk OTC drugs. Prescription drug management. Decision regarding hospitalization.   Patient received in handoff.  Deconditioning with presyncope and hypotension as well as abdominal pain.  Pending CT at time of signout.  Patient arrives with new AKI and significant lactic acid elevation.  Fluid resuscitation begun and lactic starting to improve but still elevated on recheck.  CT abdomen pelvis unremarkable.  Will require hospital admission for multiple episodes of presyncope, generalized weakness and elevated lactic acid in the setting of dehydration.       Glendora Score, MD 11/06/23 [phone]

## 2023-11-06 NOTE — Plan of Care (Signed)
   Problem: Education: Goal: Knowledge of General Education information will improve Description Including pain rating scale, medication(s)/side effects and non-pharmacologic comfort measures Outcome: Progressing   Problem: Health Behavior/Discharge Planning: Goal: Ability to manage health-related needs will improve Outcome: Progressing

## 2023-11-06 NOTE — Progress Notes (Signed)
 PROGRESS NOTE  [name] GNF:[id number] DOB: 05-30-1946 DOA: 11/05/2023 PCP: Medicine, [facility]  Brief History:  78 year old male with a history of diastolic CHF, permanent atrial fibrillation, OSA on CPAP, morbid obesity status post Roux-en-Y bypass, and orthostatic hypotension presenting with dizziness and low blood pressure with near syncope type symptoms.  The patient was recently mated to the hospital from 10/25/2023 to 10/31/2023 with similar symptoms.  He was seen by cardiology at that time.  The patient was fluid resuscitated.  The patient was started on midodrine.  He was discharged home with midodrine 15 mg 3 times daily.  His digoxin was continued as well as his rivaroxaban.  The patient states that he had some symptomatic improvement at the time of the discharge although he did have some episodes of tachycardia when he would get up with exertion. In the past 2 days, he has noted that his systolic blood pressure has had readings in the 80s.  He has noted some dizziness and generalized weakness which prompted him to check his blood pressure.  He states his oral intake is poor.  He denies any fevers, chills, chest pain, coughing, hemoptysis, nausea, vomiting or diarrhea, hematochezia, melena.  He is chronically short of breath with exertion.  He states this has not really changed.  He complains of some periumbilical abdominal pain which she states has been present for "a couple years".  He states that this is worse with exertion.  He is having normal bowel movements. He endorses compliance with his medications since discharge from the hospital.  However, he has noted increasing generalized weakness.  He denies any dysuria or hematuria. At baseline, the patient is able to stand and take some steps with assistance.  He has a caregiver 5 hours a day at least 5 days/week. In the ED, the patient was afebrile and hemodynamically stable with oxygen saturation 94% room  air.  WBC 7.8, hemoglobin 12.9, platelets 229.  Sodium 138, potassium 4.3, bicarbonate 22, serum creatinine 1.33.  AST 29, ALT 16, alk phosphatase 93, total bilirubin 1.1.  Lactic acid 3.2>> 2.1.  Chest x-ray showed bibasilar atelectasis.  CT abdomen pelvis was negative for any acute findings.  Digoxin level 0.7.   Assessment/Plan: Generalized weakness/near syncope/orthostatic hypotension -Repeat orthostatics -Continue midodrine -Recheck B12 -Recheck folic acid -10/26/2023 TSH 0.97 -PT evaluation -UA and urine culture -Cortrosyn stimulation test was normal during his last hospitalization -Continue IV fluids -check covid/RSV/Flu  Lactic acidosis -Lactic acid 3.2>>> 2.1 -Personally reviewed chest x-ray--no consolidation -Follow blood culture -UA and urine culture -Continue judicious IV fluids -Check PCT  Permanent atrial fibrillation -Continue digoxin -Continue rivaroxaban -10/01/2023 echo EF 50-55%, mild LAE, mild TR  Chronic back pain/opioid dependence -Continue gabapentin -PDMP reviewed -Norco 10/325, #120, last refill 08/17/23 -Continue Norco  Chronic HFpEF -Judicious IV fluids -Holding furosemide temporarily -10/01/2023 echo EF 50-35%  Morbid obesity -BMI 50.18 -Lifestyle modification  Macrocytic anemia -Hemoglobin is stable -Baseline hemoglobin 12 -10/27/2023 B12 416, folic acid 19.0  GERD -Continue pantoprazole  OSA -Continue CPAP        Family Communication:  no Family at bedside  Consultants:  cardiology  Code Status:  FULL   DVT Prophylaxis: xarelto   Procedures: As Listed in Progress Note Above  Antibiotics: None     Total time spent 50 minutes.  Greater than 50% spent face to face counseling and coordinating care.   Subjective: Patient denies fevers, chills, headache, chest  pain, dyspnea, nausea, vomiting, diarrhea, abdominal pain, dysuria, hematuria, hematochezia, and melena.   Objective: Vitals:   11/06/23 0530 11/06/23  0622 11/06/23 0630 11/06/23 0645  BP: 105/63 132/79 125/71 108/62  Pulse: 88 86 91 82  Resp: 16 16 15 15   Temp:      TempSrc:      SpO2: 94% 95% 94% 93%  Weight:      Height:       No intake or output data in the 24 hours ending 11/06/23 0746 Weight change:  Exam:  General:  Pt is alert, follows commands appropriately, not in acute distress HEENT: No icterus, No thrush, No neck mass, [place]/AT Cardiovascular: RRR, S1/S2, no rubs, no gallops Respiratory: CTA bilaterally, no wheezing, no crackles, no rhonchi Abdomen: Soft/+BS, non tender, non distended, no guarding Extremities: No edema, No lymphangitis, No petechiae, No rashes, no synovitis   Data Reviewed: I have personally reviewed following labs and imaging studies Basic Metabolic Panel: Recent Labs  Lab 10/30/23 0847 11/05/23 2047  NA 138 138  K 4.4 4.3  CL 104 102  CO2 25 22  GLUCOSE 87 134*  BUN 17 26*  CREATININE 1.22 1.33*  CALCIUM 9.0 9.6   Liver Function Tests: Recent Labs  Lab 11/05/23 2047  AST 29  ALT 16  ALKPHOS 93  BILITOT 1.1  PROT 8.0  ALBUMIN 3.5   No results for input(s): "LIPASE", "AMYLASE" in the last 168 hours. No results for input(s): "AMMONIA" in the last 168 hours. Coagulation Profile: No results for input(s): "INR", "PROTIME" in the last 168 hours. CBC: Recent Labs  Lab 10/30/23 0847 11/05/23 2047  WBC 4.6 7.8  NEUTROABS  --  6.2  HGB 11.7* 12.9*  HCT 37.9* 41.2  MCV 102.2* 101.0*  PLT 172 229   Cardiac Enzymes: No results for input(s): "CKTOTAL", "CKMB", "CKMBINDEX", "TROPONINI" in the last 168 hours. BNP: Invalid input(s): "POCBNP" CBG: No results for input(s): "GLUCAP" in the last 168 hours. HbA1C: No results for input(s): "HGBA1C" in the last 72 hours. Urine analysis:    Component Value Date/Time   COLORURINE YELLOW 07/12/2019 1953   APPEARANCEUR HAZY (A) 07/12/2019 1953   APPEARANCEUR Clear 01/18/2018 1539   LABSPEC 1.013 07/12/2019 1953   PHURINE 5.0 07/12/2019  1953   GLUCOSEU NEGATIVE 07/12/2019 1953   HGBUR NEGATIVE 07/12/2019 1953   BILIRUBINUR NEGATIVE 07/12/2019 1953   BILIRUBINUR Negative 01/18/2018 1539   KETONESUR NEGATIVE 07/12/2019 1953   PROTEINUR NEGATIVE 07/12/2019 1953   UROBILINOGEN 0.2 09/06/2012 0213   NITRITE NEGATIVE 07/12/2019 1953   LEUKOCYTESUR SMALL (A) 07/12/2019 1953   Sepsis Labs: @LABRCNTIP (procalcitonin:4,lacticidven:4) ) Recent Results (from the past 240 hours)  Culture, blood (routine x 2)     Status: None (Preliminary result)   Collection Time: 11/05/23  8:45 PM   Specimen: BLOOD  Result Value Ref Range Status   Specimen Description BLOOD BLOOD LEFT ARM  Final   Special Requests   Final    BOTTLES DRAWN AEROBIC ONLY Blood Culture results may not be optimal due to an inadequate volume of blood received in culture bottles   Culture   Final    NO GROWTH < 12 HOURS Performed at [facility], [address], [place], [place] [address]    Report Status PENDING  Incomplete  Culture, blood (routine x 2)     Status: None (Preliminary result)   Collection Time: 11/05/23  8:47 PM   Specimen: BLOOD  Result Value Ref Range Status   Specimen Description  BLOOD BLOOD RIGHT ARM  Final   Special Requests   Final    BOTTLES DRAWN AEROBIC AND ANAEROBIC Blood Culture adequate volume   Culture   Final    NO GROWTH < 12 HOURS Performed at [facility], [address] [place], [place] [address]    Report Status PENDING  Incomplete     Scheduled Meds:  melatonin  6 mg Oral QHS   Continuous Infusions:  lactated ringers 100 mL/hr at 11/06/23 0126    Procedures/Studies: CT ABDOMEN PELVIS W CONTRAST Result Date: 11/06/2023 CLINICAL DATA:  Acute nonlocalized abdominal pain EXAM: CT ABDOMEN AND PELVIS WITH CONTRAST TECHNIQUE: Multidetector CT imaging of the abdomen and pelvis was performed using the standard protocol following bolus administration of intravenous contrast. RADIATION DOSE REDUCTION: This exam was performed  according to the departmental dose-optimization program which includes automated exposure control, adjustment of the mA and/or kV according to patient size and/or use of iterative reconstruction technique. CONTRAST:  OMNIPAQUE IOHEXOL 300 MG/ML  SOLN COMPARISON:  01/09/2015 FINDINGS: Lower chest: Pleural thickening and calcifications seen at the lung bases bilaterally are in keeping with bilateral fibrothorax. Superimposed bibasilar parenchymal scarring is seen. Mild coronary artery calcification. Global cardiac size within limits. Hepatobiliary: Status post cholecystectomy. Mild intrahepatic and moderate extrahepatic biliary ductal dilation is unchanged from prior examination likely representing post cholecystectomy change. Liver unremarkable., Pancreas: Unremarkable Spleen: Unremarkable Adrenals/Urinary Tract: The adrenal glands are unremarkable. There is mild bilateral progressive renal cortical atrophy. Kidneys are normal in position. No hydronephrosis. No intrarenal or ureteral calculi. Simple cortical cyst noted arising from the lower pole the right kidney for which no follow-up imaging is recommended. Bladder is partially obscured by streak artifact however the visualized portion is unremarkable. Stomach/Bowel: Surgical changes of Roux-en-Y gastric bypass are identified. Appendix is absent. The stomach, small bowel, and large bowel are otherwise unremarkable. No evidence of obstruction or focal inflammation. No free intraperitoneal gas or fluid. Vascular/Lymphatic: Aortic atherosclerosis. No enlarged abdominal or pelvic lymph nodes. Reproductive: Obscured by streak artifact. Other: No abdominal wall hernia Musculoskeletal: Status post bilateral total hip arthroplasty. Osseous structures are age-appropriate. IMPRESSION: 1. No acute intra-abdominal pathology identified. No definite radiographic explanation for the patient's reported symptoms. 2. Bilateral fibrothorax. 3. Mild coronary artery  calcification. 4. Surgical changes of Roux-en-Y gastric bypass. 5. Mild bilateral progressive renal cortical atrophy. Aortic Atherosclerosis (ICD10-I70.0). Electronically Signed   By: Helyn Numbers M.D.   On: 11/06/2023 00:52   DG Chest Portable 1 View Result Date: 11/05/2023 CLINICAL DATA:  Shortness of breath and dizziness. EXAM: PORTABLE CHEST 1 VIEW COMPARISON:  October 25, 2023. FINDINGS: The cardiac silhouette is enlarged and unchanged in size. Stable, chronic left perihilar scarring is seen. Mild scarring, atelectasis and/or infiltrate is also noted within the bilateral lung bases. Small bilateral pleural effusions are suspected with small pleural based calcifications seen along the periphery of the left lower lobe. No pneumothorax is identified. Multilevel degenerative changes seen throughout the thoracic spine. IMPRESSION: 1. Stable cardiomegaly with mild bibasilar scarring, atelectasis and/or infiltrate. 2. Small bilateral pleural effusions. Electronically Signed   By: Aram Candela M.D.   On: 11/05/2023 23:10   CT Head Wo Contrast Result Date: 10/25/2023 CLINICAL DATA:  Syncope/presyncope, cerebrovascular cause suspected EXAM: CT HEAD WITHOUT CONTRAST TECHNIQUE: Contiguous axial images were obtained from the base of the skull through the vertex without intravenous contrast. RADIATION DOSE REDUCTION: This exam was performed according to the departmental dose-optimization program which includes automated exposure control, adjustment of the  mA and/or kV according to patient size and/or use of iterative reconstruction technique. COMPARISON:  None Available. FINDINGS: Brain: No evidence of acute infarction, hemorrhage, hydrocephalus, extra-axial collection or mass lesion/mass effect. Patchy white matter hypodensities are nonspecific but compatible with chronic microvascular ischemic change. Vascular: No mastoid effusions. Skull: No acute fracture. Sinuses/Orbits: Right maxillary sinus mucosal  thickening no acute orbital findings. Other: No mastoid effusions. IMPRESSION: No evidence of acute intracranial abnormality. Electronically Signed   By: Feliberto Harts M.D.   On: 10/25/2023 15:17   DG Chest Port 1 View Result Date: 10/25/2023 CLINICAL DATA:  Syncope, chest pain, hypotension EXAM: PORTABLE CHEST 1 VIEW COMPARISON:  09/25/2020 FINDINGS: 2 frontal views of the chest demonstrate a stable enlarged cardiac silhouette. Continued ectasia and atherosclerosis of the thoracic aorta. There is chronic scarring within the left perihilar region, with diffuse left-sided pleural calcifications unchanged. No acute airspace disease, effusion, or pneumothorax. No acute bony abnormalities. IMPRESSION: 1. Chronic left pleural calcifications and left perihilar scarring. No acute intrathoracic process. Electronically Signed   By: Sharlet Salina M.D.   On: 10/25/2023 15:07    Catarina Hartshorn, DO  [facility]  If 7PM-7AM, please contact night-coverage [email or web address] Password [email or web address] 11/06/2023, 7:46 AM   LOS: 0 days

## 2023-11-06 NOTE — Hospital Course (Signed)
 78 year old male with a history of diastolic CHF, permanent atrial fibrillation, OSA on CPAP, morbid obesity status post Roux-en-Y bypass, and orthostatic hypotension presenting with dizziness and low blood pressure with near syncope type symptoms.  The patient was recently mated to the hospital from 10/25/2023 to 10/31/2023 with similar symptoms.  He was seen by cardiology at that time.  The patient was fluid resuscitated.  The patient was started on midodrine.  He was discharged home with midodrine 15 mg 3 times daily.  His digoxin was continued as well as his rivaroxaban.  The patient states that he had some symptomatic improvement at the time of the discharge although he did have some episodes of tachycardia when he would get up with exertion. In the past 2 days, he has noted that his systolic blood pressure has had readings in the 80s.  He has noted some dizziness and generalized weakness which prompted him to check his blood pressure.  He states his oral intake is poor.  He denies any fevers, chills, chest pain, coughing, hemoptysis, nausea, vomiting or diarrhea, hematochezia, melena.  He is chronically short of breath with exertion.  He states this has not really changed.  He complains of some periumbilical abdominal pain which she states has been present for "a couple years".  He states that this is worse with exertion.  He is having normal bowel movements. He endorses compliance with his medications since discharge from the hospital.  However, he has noted increasing generalized weakness.  He denies any dysuria or hematuria. At baseline, the patient is able to stand and take some steps with assistance.  He has a caregiver 5 hours a day at least 5 days/week. In the ED, the patient was afebrile and hemodynamically stable with oxygen saturation 94% room air.  WBC 7.8, hemoglobin 12.9, platelets 229.  Sodium 138, potassium 4.3, bicarbonate 22, serum creatinine 1.33.  AST 29, ALT 16, alk phosphatase 93, total  bilirubin 1.1.  Lactic acid 3.2>> 2.1.  Chest x-ray showed bibasilar atelectasis.  CT abdomen pelvis was negative for any acute findings.  Digoxin level 0.7.

## 2023-11-06 NOTE — Plan of Care (Signed)
  Problem: Acute Rehab PT Goals(only PT should resolve) Goal: Pt Will Go Supine/Side To Sit Outcome: Progressing Flowsheets (Taken 11/06/2023 1405) Pt will go Supine/Side to Sit:  with contact guard assist  with minimal assist Goal: Patient Will Transfer Sit To/From Stand Outcome: Progressing Flowsheets (Taken 11/06/2023 1405) Patient will transfer sit to/from stand:  with contact guard assist  with minimal assist Goal: Pt Will Transfer Bed To Chair/Chair To Bed Outcome: Progressing Flowsheets (Taken 11/06/2023 1405) Pt will Transfer Bed to Chair/Chair to Bed:  with contact guard assist  with min assist Goal: Pt Will Ambulate Outcome: Progressing Flowsheets (Taken 11/06/2023 1405) Pt will Ambulate:  25 feet  with minimal assist  with contact guard assist  with rolling walker   2:06 PM, 11/06/23 Ocie Bob, MPT Physical Therapist with [facility] [phone] office [phone] mobile phone

## 2023-11-06 NOTE — Evaluation (Signed)
 Physical Therapy Evaluation Patient Details Name: [name] MRN: [id number] DOB: 05/13/1946 Today's Date: 11/06/2023  History of Present Illness  [name] is a 78 y.o. male with medical history significant of diastolic CHF, atrial fibrillation, OSA on CPAP, morbid obesity who presents to the emergency department due to low blood pressure, dizziness, chills, weakness, fatigue, having sensation of going to pass out (but did not pass out).  He was recently admitted from 2/23 to 3/1 due to near syncope, he was noted to have orthostatic changes and palpitation at that time, cardiologist was consulted and recommended continuing digoxin and midodrine.  Patient states that symptoms resolved for a few days after last discharge, but his symptoms have since returned..   Clinical Impression  Patient has most difficulty sitting up at bedside and completing sit to stands from chair due to weakness. Patient able to take a few side steps and required repeated verbal cueing for proper hand placement during sit to stands from chair wit fair carryover and sat up in chair briefly before requesting to go back to bed. Patient will benefit from continued skilled physical therapy in hospital and recommended venue below to increase strength, balance, endurance for safe ADLs and gait.         If plan is discharge home, recommend the following: A lot of help with walking and/or transfers;A little help with bathing/dressing/bathroom;Assistance with cooking/housework;Help with stairs or ramp for entrance   Can travel by private vehicle        Equipment Recommendations None recommended by PT  Recommendations for Other Services       Functional Status Assessment Patient has had a recent decline in their functional status and demonstrates the ability to make significant improvements in function in a reasonable and predictable amount of time.     Precautions / Restrictions Precautions Precautions:  Fall Restrictions Weight Bearing Restrictions Per Provider Order: No      Mobility  Bed Mobility Overal bed mobility: Needs Assistance Bed Mobility: Supine to Sit, Sit to Supine     Supine to sit: Min assist, Mod assist Sit to supine: Mod assist   General bed mobility comments: has difficulty moving legs onto/off bed due to weakness    Transfers Overall transfer level: Needs assistance Equipment used: Rolling walker (2 wheels) Transfers: Sit to/from Stand, Bed to chair/wheelchair/BSC Sit to Stand: Min assist   Step pivot transfers: Min assist, Mod assist       General transfer comment: has most difficulty standing up from chair due to BLE weakness    Ambulation/Gait Ambulation/Gait assistance: Min assist, Mod assist Gait Distance (Feet): 4 Feet Assistive device: Rolling walker (2 wheels) Gait Pattern/deviations: Decreased step length - right, Decreased step length - left, Decreased stride length, Trunk flexed Gait velocity: slow     General Gait Details: limited to a few side steps before having to sit due to c/o generalized weakness  Stairs            Wheelchair Mobility     Tilt Bed    Modified Rankin (Stroke Patients Only)       Balance Overall balance assessment: Needs assistance Sitting-balance support: Feet supported, No upper extremity supported Sitting balance-[name]: Fair Sitting balance - Comments: seated at EOB   Standing balance support: Reliant on assistive device for balance, During functional activity, Bilateral upper extremity supported Standing balance-[name]: Fair Standing balance comment: using RW  Pertinent Vitals/Pain Pain Assessment Pain Assessment: 0-10 Pain Score: 7  Pain Location: mostly low back, some in neck, shoulders Pain Descriptors / Indicators: Aching, Sore Pain Intervention(s): Limited activity within patient's tolerance, Monitored during session, Repositioned     Home Living Family/patient expects to be discharged to:: Private residence Living Arrangements: Alone Available Help at Discharge: Family;Available PRN/intermittently;Personal care attendant (aide come 5x/wk for 4 hrs a day) Type of Home: House Home Access: Ramped entrance       Home Layout: One level Home Equipment: Agricultural consultant (2 wheels);Wheelchair - manual;BSC/3in1;Hospital bed      Prior Function Prior Level of Function : Needs assist       Physical Assist : Mobility (physical);ADLs (physical) Mobility (physical): Gait;Bed mobility;Transfers;Stairs   Mobility Comments: household ambulation using RW ADLs Comments: Home aides 5 hours/day x 5 days per week, has friend that helps on sundays     Extremity/Trunk Assessment   Upper Extremity Assessment Upper Extremity Assessment: Generalized weakness    Lower Extremity Assessment Lower Extremity Assessment: Generalized weakness    Cervical / Trunk Assessment Cervical / Trunk Assessment: Kyphotic  Communication   Communication Communication: No apparent difficulties    Cognition Arousal: Alert Behavior During Therapy: WFL for tasks assessed/performed   PT - Cognitive impairments: No apparent impairments                         Following commands: Intact       Cueing Cueing Techniques: Verbal cues, Tactile cues     General Comments      Exercises     Assessment/Plan    PT Assessment Patient needs continued PT services  PT Problem List Decreased strength;Decreased activity tolerance;Decreased balance;Decreased mobility       PT Treatment Interventions DME instruction;Gait training;Stair training;Functional mobility training;Therapeutic activities;Therapeutic exercise;Balance training;Patient/family education    PT Goals (Current goals can be found in the Care Plan section)  Acute Rehab PT Goals Patient Stated Goal: return home with caregivers to assist PT Goal Formulation: With  patient Time For Goal Achievement: 11/11/23 Potential to Achieve Goals: Good    Frequency Min 3X/week     Co-evaluation               AM-PAC PT "6 Clicks" Mobility  Outcome Measure Help needed turning from your back to your side while in a flat bed without using bedrails?: A Little Help needed moving from lying on your back to sitting on the side of a flat bed without using bedrails?: A Lot Help needed moving to and from a bed to a chair (including a wheelchair)?: A Little Help needed standing up from a chair using your arms (e.g., wheelchair or bedside chair)?: A Lot Help needed to walk in hospital room?: A Little Help needed climbing 3-5 steps with a railing? : A Lot 6 Click Score: 15    End of Session   Activity Tolerance: Patient tolerated treatment well;Patient limited by fatigue Patient left: in bed;with call bell/phone within reach Nurse Communication: Mobility status PT Visit Diagnosis: Unsteadiness on feet (R26.81);Other abnormalities of gait and mobility (R26.89);Muscle weakness (generalized) (M62.81)    Time: 4782-9562 PT Time Calculation (min) (ACUTE ONLY): 22 min   Charges:   PT Evaluation $PT Eval Moderate Complexity: 1 Mod PT Treatments $Therapeutic Activity: 23-37 mins $Neuromuscular Re-education: 8-22 mins PT General Charges $$ ACUTE PT VISIT: 1 Visit         2:04 PM, 11/06/23 Ocie Bob, MPT Physical Therapist  with [facility] 336 [phone] office [phone] mobile phone

## 2023-11-06 NOTE — Consult Note (Signed)
 Cardiology Consultation   Patient ID: [name] MRN: [id number]; DOB: [birth year]  Admit date: 11/05/2023 Date of Consult: 11/06/2023  PCP:  Medicine, [facility]   [facility] Providers Cardiologist:  Previously followed by Dr. Boneta Lucks with [facility] but requested to switch to Dr. Tenny Craw last admission  Patient Profile:   [name] is a 78 y.o. male with a hx of chronic HFpEF, permanent atrial fibrillation (dating back to at least 2020), OSA, obesity, giant cell arteritis, aortic atherosclerosis and orthostatic hypotension who is being seen 11/06/2023 for the evaluation of orthostatic hypotension and atrial fibrillation at the request of Dr. Arbutus Leas.  History of Present Illness:   [name] was recently admitted to [facility] from 2/23 - 10/31/2023 for episodes of near syncope and CT Head was without acute findings. Had previously been on Metoprolol but this was discontinued several weeks prior to admission. Was significantly orthostatic upon arrival and started on IV fluids. Was started on Midodrine and this was titrated over time and increased to 15 mg 3 times daily at discharge. Was continued on Digoxin 0.25 mg daily for rate control and Xarelto for anticoagulation. It was not felt that he would tolerate resumption of Metoprolol given significant orthostasis. Was recommended as an outpatient to obtain further workup for infiltrative disease with outpatient cMRI or PYP scan.  He presented back to the ED on 11/05/2023 for evaluation of worsening weakness, fatigue, muscle pain and chills. In talking with the patient today, he reports feeling back to his normal state of health for approximately 2 days following hospital discharge but developed recurrent episodes of dizziness, weakness and near syncope. Reports his activity has been limited at home and he has a caregiver that helps with activities. Reports occasional palpitations but no persistent symptoms. No  recent orthopnea, PND or pitting edema. Uses CPAP at night. He reports he has been consuming at least 4-5 bottles of water daily along with coffee. Reports good compliance with Midodrine and has been taking 15 mg 3 times daily.  Initial labs showed WBC 7.8, Hgb 12.9, platelets 229, Na+ 138, K+ 4.3 and creatinine 1.33 (previously 1.22 on 10/30/2023). Initial lactic acid elevated to 3.2 with repeat values at 2.1 and 0.9.  Blood cultures pending. Digoxin level is 0.7. Respiratory panel pending from this morning. CXR showed stable cardiomegaly with mild bibasilar scarring, atelectasis and/or infiltrate.  Also noted to have small bilateral pleural effusions. CT Abdomen showed no acute intra-abdominal abnormalities. EKG showed atrial fibrillation, heart rate 100 with low voltage and right axis deviation. Orthostatics were checked and BP went from 93/56 (HR 83) with lying down to 84/56 (HR 102) with sitting and at 117/104 with standing but dropped to 75/54 after standing for 3 minutes and heart rate increased to 145 bpm.   He was started on IV fluids and has been restarted on Midodrine but at 10 mg 3 times daily.  Past Medical History:  Diagnosis Date   Arthritis    knees,feet,shoulders,elbows.hands   CHF (congestive heart failure) (HCC)    Chronic atrial fibrillation (HCC)    Constipation    Dyspnea    with exertion    Dysrhythmia    Atrial Flutter- 2006- corredted itself   Family history of adverse reaction to anesthesia    mother- with novocaine went into shock   GERD (gastroesophageal reflux disease)    Headache    Hemorrhoids    History of blood transfusion    Hypotension  Insomnia    Sleep apnea    cpap   Spondylosis of cervical region without myelopathy or radiculopathy 04/30/2017   Formatting of this note might be different from the original. Added automatically from request for surgery [id number]   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    Past Surgical History:  Procedure  Laterality Date   APPENDECTOMY     ARTERY BIOPSY Left 12/30/2017   Procedure: BIOPSY TEMPORAL ARTERY;  Surgeon: Claud Kelp, MD;  Location: [facility];  Service: General;  Laterality: Left;   CHOLECYSTECTOMY     COLONOSCOPY W/ POLYPECTOMY     ESOPHAGOGASTRODUODENOSCOPY (EGD) WITH PROPOFOL  07/06/2012   Procedure: ESOPHAGOGASTRODUODENOSCOPY (EGD) WITH PROPOFOL;  Surgeon: Petra Kuba, MD;  Location: [facility];  Service: Endoscopy;  Laterality: N/A;   ESOPHAGOSCOPY     EYE SURGERY     left eye- muscle repair   HERNIA REPAIR     ventral hernia   INCISION AND DRAINAGE HIP Left 03/17/2018   Procedure: IRRIGATION AND DEBRIDEMENT LEFT THIGH WOUND;  Surgeon: Ollen Gross, MD;  Location: [facility];  Service: Orthopedics;  Laterality: Left;   JOINT REPLACEMENT     bilateral hips.  Right broke and had to be re placed again   MASS EXCISION N/A 03/02/2015   Procedure: EXCISION ABDOMINAL WALL MASS;  Surgeon: Ovidio Kin, MD;  Location: [facility] OR;  Service: General;  Laterality: N/A;   TOTAL HIP REVISION Left 02/17/2018   Procedure: Left total hip arthroplasty revision;  Surgeon: Ollen Gross, MD;  Location: [facility];  Service: Orthopedics;  Laterality: Left;   VERTICAL BANDED GASTROPLASTY       Home Medications:  Prior to Admission medications   Medication Sig Start Date End Date Taking? Authorizing Provider  alum & mag hydroxide-simeth (MAALOX/MYLANTA) 200-200-20 MG/5ML suspension Take 15 mLs by mouth every 6 (six) hours as needed for indigestion or heartburn.    [provider]  digoxin (LANOXIN) 0.25 MG tablet Take 1 tablet (0.25 mg total) by mouth daily. 11/01/23   Vassie Loll, MD  Ensure Max Protein (ENSURE MAX PROTEIN) LIQD Take 330 mLs (11 oz total) by mouth 2 (two) times daily. 12/07/18   Leroy Sea, MD  escitalopram (LEXAPRO) 10 MG tablet Take 1 tablet (10 mg total) by mouth daily. 09/02/18   Hongalgi, Maximino Greenland, MD  gabapentin (NEURONTIN) 300 MG capsule Take 1 capsule (300 mg total) by  mouth 2 (two) times daily. 10/31/23   Vassie Loll, MD  HYDROcodone-acetaminophen [facility]) 10-325 MG tablet Take 0.5-1 tablets by mouth every 6 (six) hours as needed for moderate pain (pain score 4-6). 08/17/23   [provider]  levalbuterol Pauline Aus HFA) 45 MCG/ACT inhaler Inhale 2 puffs into the lungs every 6 (six) hours as needed for wheezing or shortness of breath. 10/31/23 10/30/24  Vassie Loll, MD  Melatonin 3 MG TABS Take 2 tablets (6 mg total) by mouth at bedtime. Patient taking differently: Take 3 mg by mouth at bedtime. Plus 20 mg in another supplement 09/11/18   Osvaldo Shipper, MD  midodrine (PROAMATINE) 5 MG tablet Take 3 tablets (15 mg total) by mouth 3 (three) times daily with meals. 10/31/23 01/29/24  Vassie Loll, MD  Multiple Vitamins-Minerals (CENTRUM ADULTS PO) Take 1 tablet by mouth every evening.     [provider]  pantoprazole (PROTONIX) 40 MG tablet Take 1 tablet (40 mg total) by mouth daily. 09/02/18   Hongalgi, Maximino Greenland, MD  promethazine (PHENERGAN) 25 MG tablet Take 25 mg by  mouth every 6 (six) hours as needed for nausea or vomiting. 09/22/23   [provider]  traZODone (DESYREL) 50 MG tablet Take 1 tablet (50 mg total) by mouth at bedtime as needed for sleep. 11/26/18   Dorcas Carrow, MD  XARELTO 20 MG TABS tablet Take 20 mg by mouth every evening. 09/18/20   [provider]    Inpatient Medications: Scheduled Meds:  melatonin  6 mg Oral QHS   midodrine  10 mg Oral TID WC   Continuous Infusions:  lactated ringers 100 mL/hr at 11/06/23 0126   PRN Meds: acetaminophen **OR** acetaminophen, HYDROcodone-acetaminophen, ondansetron **OR** ondansetron (ZOFRAN) IV  Allergies:    Allergies  Allergen Reactions   Bee Venom Anaphylaxis and Other (See Comments)    Noted from [place]. honey bee venom   Nickel Rash and Other (See Comments)    "Breaks me out"    Social History:   Social History   Socioeconomic History   Marital  status: Divorced    Spouse name: Not on file   Number of children: Not on file   Years of education: Not on file   Highest education level: Not on file  Occupational History   Not on file  Tobacco Use   Smoking status: Former    Current packs/day: 0.00    Average packs/day: 2.0 packs/day for 17.8 years (35.6 ttl pk-yrs)    Types: Cigarettes    Start date: 05/16/1966    Quit date: 03/11/1984    Years since quitting: 39.6   Smokeless tobacco: Never  Vaping Use   Vaping status: Never Used  Substance and Sexual Activity   Alcohol use: Not Currently    Comment: occasionally a couple of times a year    Drug use: No   Sexual activity: Not on file  Other Topics Concern   Not on file  Social History Narrative   Not on file   Social Drivers of Health   Financial Resource Strain: Low Risk  (09/15/2023)   Received from [facility]   Overall Financial Resource Strain (CARDIA)    Difficulty of Paying Living Expenses: Not hard at all  Food Insecurity: No Food Insecurity (10/25/2023)   Hunger Vital Sign    Worried About Running Out of Food in the Last Year: Never true    Ran Out of Food in the Last Year: Never true  Transportation Needs: No Transportation Needs (10/25/2023)   PRAPARE - Administrator, Civil Service (Medical): No    Lack of Transportation (Non-Medical): No  Physical Activity: Sufficiently Active (01/11/2023)   Received from [facility]   Exercise Vital Sign    Days of Exercise per Week: 7 days    Minutes of Exercise per Session: 60 min  Stress: No Stress Concern Present (10/08/2023)   Received from [facility] of Occupational Health - Occupational Stress Questionnaire    Feeling of Stress : Not at all  Social Connections: Moderately Isolated (10/26/2023)   Social Connection and Isolation Panel [NHANES]    Frequency of Communication with Friends and Family: More than three times a week    Frequency of Social Gatherings with Friends and  Family: More than three times a week    Attends Religious Services: 1 to 4 times per year    Active Member of [employer] or Organizations: No    Attends Banker Meetings: Never    Marital Status: Divorced  Catering manager Violence: Not At Risk (  10/25/2023)   Humiliation, Afraid, Rape, and Kick questionnaire    Fear of Current or Ex-Partner: No    Emotionally Abused: No    Physically Abused: No    Sexually Abused: No    Family History:    Family History  Problem Relation Age of Onset   Cancer Mother    Alcohol abuse Father      ROS:  Please see the history of present illness.   All other ROS reviewed and negative.     Physical Exam/Data:   Vitals:   11/06/23 0630 11/06/23 0645 11/06/23 0715 11/06/23 0745  BP: 125/71 108/62 115/61 129/61  Pulse: 91 82 81 87  Resp: 15 15 15 15   Temp:      TempSrc:      SpO2: 94% 93% 92% 95%  Weight:      Height:       No intake or output data in the 24 hours ending 11/06/23 0806    11/05/2023   12:52 PM 10/25/2023    8:13 PM 10/25/2023    2:26 PM  Last 3 Weights  Weight (lbs) 330 lb 0.5 oz 330 lb 0.5 oz 320 lb  Weight (kg) 149.7 kg 149.7 kg 145.151 kg     Body mass index is 50.18 kg/m.  General: Pleasant, obese male appearing in no acute distress. HEENT: normal Neck: JVD difficult to assess secondary to body habitus. Vascular: No carotid bruits; Distal pulses 2+ bilaterally Cardiac:  normal S1, S2; Irregular irregular. Lungs:  clear to auscultation bilaterally, no wheezing, rhonchi or rales  Abd: soft, nontender, no hepatomegaly  Ext: Trace ankle edema bilaterally.  Musculoskeletal:  No deformities, BUE and BLE strength normal and equal Skin: warm and dry  Neuro:  CNs 2-12 intact, no focal abnormalities noted Psych:  Normal affect   EKG:  The EKG was personally reviewed and demonstrates: Atrial fibrillation, heart rate 100 with low voltage and right axis deviation.  Telemetry:  Telemetry was personally reviewed and  demonstrates: Atrial fibrillation, heart rate in 80s to 90s with occasional PVCs.  Relevant CV Studies:  Echocardiogram: 09/2023 Left Ventricle: Systolic function is low normal. EF: 50-55%.    Left Ventricle: Wall thickness is normal.    Left Ventricle: Left ventricle size is normal.    Left Ventricle: No regional wall motion abnormalities noted.    Left Atrium: Left atrium is mildly dilated at 4.700 cm.    Tricuspid Valve: There is mild regurgitation.   Laboratory Data:  High Sensitivity Troponin:   Recent Labs  Lab 10/25/23 1541 10/25/23 1734  TROPONINIHS 5 7     Chemistry Recent Labs  Lab 10/30/23 0847 11/05/23 2047  NA 138 138  K 4.4 4.3  CL 104 102  CO2 25 22  GLUCOSE 87 134*  BUN 17 26*  CREATININE 1.22 1.33*  CALCIUM 9.0 9.6  GFRNONAA >60 55*  ANIONGAP 9 14    Recent Labs  Lab 11/05/23 2047  PROT 8.0  ALBUMIN 3.5  AST 29  ALT 16  ALKPHOS 93  BILITOT 1.1   Lipids No results for input(s): "CHOL", "TRIG", "HDL", "LABVLDL", "LDLCALC", "CHOLHDL" in the last 168 hours.  Hematology Recent Labs  Lab 10/30/23 0847 11/05/23 2047  WBC 4.6 7.8  RBC 3.71* 4.08*  HGB 11.7* 12.9*  HCT 37.9* 41.2  MCV 102.2* 101.0*  MCH 31.5 31.6  MCHC 30.9 31.3  RDW 14.1 14.6  PLT 172 229   Thyroid No results for input(s): "TSH", "FREET4" in the last  168 hours.  BNPNo results for input(s): "BNP", "PROBNP" in the last 168 hours.  DDimer No results for input(s): "DDIMER" in the last 168 hours.   Radiology/Studies:  CT ABDOMEN PELVIS W CONTRAST Result Date: 11/06/2023 CLINICAL DATA:  Acute nonlocalized abdominal pain EXAM: CT ABDOMEN AND PELVIS WITH CONTRAST TECHNIQUE: Multidetector CT imaging of the abdomen and pelvis was performed using the standard protocol following bolus administration of intravenous contrast. RADIATION DOSE REDUCTION: This exam was performed according to the departmental dose-optimization program which includes automated exposure control, adjustment of  the mA and/or kV according to patient size and/or use of iterative reconstruction technique. CONTRAST:  OMNIPAQUE IOHEXOL 300 MG/ML  SOLN COMPARISON:  01/09/2015 FINDINGS: Lower chest: Pleural thickening and calcifications seen at the lung bases bilaterally are in keeping with bilateral fibrothorax. Superimposed bibasilar parenchymal scarring is seen. Mild coronary artery calcification. Global cardiac size within limits. Hepatobiliary: Status post cholecystectomy. Mild intrahepatic and moderate extrahepatic biliary ductal dilation is unchanged from prior examination likely representing post cholecystectomy change. Liver unremarkable., Pancreas: Unremarkable Spleen: Unremarkable Adrenals/Urinary Tract: The adrenal glands are unremarkable. There is mild bilateral progressive renal cortical atrophy. Kidneys are normal in position. No hydronephrosis. No intrarenal or ureteral calculi. Simple cortical cyst noted arising from the lower pole the right kidney for which no follow-up imaging is recommended. Bladder is partially obscured by streak artifact however the visualized portion is unremarkable. Stomach/Bowel: Surgical changes of Roux-en-Y gastric bypass are identified. Appendix is absent. The stomach, small bowel, and large bowel are otherwise unremarkable. No evidence of obstruction or focal inflammation. No free intraperitoneal gas or fluid. Vascular/Lymphatic: Aortic atherosclerosis. No enlarged abdominal or pelvic lymph nodes. Reproductive: Obscured by streak artifact. Other: No abdominal wall hernia Musculoskeletal: Status post bilateral total hip arthroplasty. Osseous structures are age-appropriate. IMPRESSION: 1. No acute intra-abdominal pathology identified. No definite radiographic explanation for the patient's reported symptoms. 2. Bilateral fibrothorax. 3. Mild coronary artery calcification. 4. Surgical changes of Roux-en-Y gastric bypass. 5. Mild bilateral progressive renal cortical atrophy. Aortic  Atherosclerosis (ICD10-I70.0). Electronically Signed   By: Helyn Numbers M.D.   On: 11/06/2023 00:52   DG Chest Portable 1 View Result Date: 11/05/2023 CLINICAL DATA:  Shortness of breath and dizziness. EXAM: PORTABLE CHEST 1 VIEW COMPARISON:  October 25, 2023. FINDINGS: The cardiac silhouette is enlarged and unchanged in size. Stable, chronic left perihilar scarring is seen. Mild scarring, atelectasis and/or infiltrate is also noted within the bilateral lung bases. Small bilateral pleural effusions are suspected with small pleural based calcifications seen along the periphery of the left lower lobe. No pneumothorax is identified. Multilevel degenerative changes seen throughout the thoracic spine. IMPRESSION: 1. Stable cardiomegaly with mild bibasilar scarring, atelectasis and/or infiltrate. 2. Small bilateral pleural effusions. Electronically Signed   By: Aram Candela M.D.   On: 11/05/2023 23:10     Assessment and Plan:   1. Orthostatic Hypotension - Recurrent issue for the patient as noted during prior admission and stimulation test at that time was without evidence of adrenal insufficiency. Kappa and lambda free light chains were elevated which resulted as an outpatient and was recommended to refer to Hematology for further assessment. Protein electrophoresis pending. Also recommended to have an outpatient cMRI or PYP scan to rule out cardiac amyloidosis. - Was again orthostatic on admission and he has been restarted on Midodrine. Will adjust dosing to 15 mg 3 times daily as he was taking at home. Continue with IV fluids. He is also requesting a regular diet as he  was previously informed to liberalize salt intake. Will continue to follow response with fluids and restarting Midodrine. May need to consider the addition of Florinef pending reassessment of orthostatic BP.  2. Permanent Atrial Fibrillation - This dates back to at least 2020 and he reports occasional, brief palpitations but no  persistent symptoms. He was previously on Lopressor but this was stopped earlier this year given orthostasis.  Remains on Digoxin 0.25 mg daily and Dig Level is at 0.7 this admission. - Remains on Xarelto 20 mg daily for anticoagulation.  3. Chronic HFpEF - He has trace edema on examination today and CXR did show small bilateral pleural effusions.  Currently receiving IV fluids as discussed above. Monitor fluid intake closely. Would benefit from workup for cardiac amyloid as discussed above.  4. OSA - Continued compliance with CPAP encouraged.  5. Elevated Lactic Acid - LA peaked at 3.2 and has normalized by most recent assessment. Blood cultures and procalcitonin pending. Continues to receive IV fluids.   Risk Assessment/Risk Scores:    CHA2DS2-VASc Score = 4   This indicates a 4.8% annual risk of stroke. The patient's score is based upon: CHF History: 1 HTN History: 0 Diabetes History: 0 Stroke History: 0 Vascular Disease History: 1 (aortic plaque) Age Score: 2 Gender Score: 0    For questions or updates, please contact [facility] Please consult [email or web address] for contact info under    Signed, Ellsworth Lennox, PA-C  11/06/2023 8:06 AM

## 2023-11-06 NOTE — ED Notes (Signed)
   11/06/23 1423  TOC Brief Assessment  Insurance and Status Reviewed  Patient has primary care physician Yes  Home environment has been reviewed Lives alone  Transition of care needs transition of care needs identified, TOC will continue to follow   HHPT recommended. No accepting agencies at this time.

## 2023-11-07 ENCOUNTER — Inpatient Hospital Stay (HOSPITAL_COMMUNITY)

## 2023-11-07 DIAGNOSIS — N179 Acute kidney failure, unspecified: Secondary | ICD-10-CM | POA: Diagnosis not present

## 2023-11-07 DIAGNOSIS — R55 Syncope and collapse: Secondary | ICD-10-CM | POA: Diagnosis not present

## 2023-11-07 DIAGNOSIS — I4891 Unspecified atrial fibrillation: Secondary | ICD-10-CM | POA: Diagnosis not present

## 2023-11-07 DIAGNOSIS — I5032 Chronic diastolic (congestive) heart failure: Secondary | ICD-10-CM | POA: Diagnosis not present

## 2023-11-07 DIAGNOSIS — I482 Chronic atrial fibrillation, unspecified: Secondary | ICD-10-CM | POA: Diagnosis not present

## 2023-11-07 LAB — COMPREHENSIVE METABOLIC PANEL
ALT: 14 U/L (ref 0–44)
AST: 20 U/L (ref 15–41)
Albumin: 2.8 g/dL — ABNORMAL LOW (ref 3.5–5.0)
Alkaline Phosphatase: 72 U/L (ref 38–126)
Anion gap: 11 (ref 5–15)
BUN: 19 mg/dL (ref 8–23)
CO2: 24 mmol/L (ref 22–32)
Calcium: 9.1 mg/dL (ref 8.9–10.3)
Chloride: 105 mmol/L (ref 98–111)
Creatinine, Ser: 1.08 mg/dL (ref 0.61–1.24)
GFR, Estimated: >60 mL/min (ref >60)
Glucose, Bld: 95 mg/dL (ref 70–99)
Potassium: 4.6 mmol/L (ref 3.5–5.1)
Sodium: 140 mmol/L (ref 135–145)
Total Bilirubin: 0.5 mg/dL (ref 0.0–1.2)
Total Protein: 6.4 g/dL — ABNORMAL LOW (ref 6.5–8.1)

## 2023-11-07 LAB — MAGNESIUM: Magnesium: 2.3 mg/dL (ref 1.7–2.4)

## 2023-11-07 LAB — CBC
HCT: 37.4 % — ABNORMAL LOW (ref 39.0–52.0)
Hemoglobin: 11.4 g/dL — ABNORMAL LOW (ref 13.0–17.0)
MCH: 31.3 pg (ref 26.0–34.0)
MCHC: 30.5 g/dL (ref 30.0–36.0)
MCV: 102.7 fL — ABNORMAL HIGH (ref 80.0–100.0)
Platelets: 185 10*3/uL (ref 150–400)
RBC: 3.64 MIL/uL — ABNORMAL LOW (ref 4.22–5.81)
RDW: 14.6 % (ref 11.5–15.5)
WBC: 5.2 10*3/uL (ref 4.0–10.5)
nRBC: 0 % (ref 0.0–0.2)

## 2023-11-07 LAB — PHOSPHORUS: Phosphorus: 3.9 mg/dL (ref 2.5–4.6)

## 2023-11-07 MED ORDER — HYDROCODONE-ACETAMINOPHEN 10-325 MG PO TABS
1.0000 | ORAL_TABLET | ORAL | Status: DC | PRN
  Administered 2023-11-07 – 2023-11-09 (×4): 1 via ORAL
  Filled 2023-11-07 (×4): qty 1

## 2023-11-07 MED ORDER — PANTOPRAZOLE SODIUM 40 MG PO TBEC
40.0000 mg | DELAYED_RELEASE_TABLET | Freq: Two times a day (BID) | ORAL | Status: DC
  Administered 2023-11-07 – 2023-11-09 (×4): 40 mg via ORAL
  Filled 2023-11-07 (×4): qty 1

## 2023-11-07 NOTE — Plan of Care (Signed)

## 2023-11-07 NOTE — Progress Notes (Signed)
 PROGRESS NOTE  [name] ZOX:[id number] DOB: 1946-06-29 DOA: 11/05/2023 PCP: Medicine, [facility]  Brief History:  78 year old male with a history of diastolic CHF, permanent atrial fibrillation, OSA on CPAP, morbid obesity status post Roux-en-Y bypass, and orthostatic hypotension presenting with dizziness and low blood pressure with near syncope type symptoms.  The patient was recently mated to the hospital from 10/25/2023 to 10/31/2023 with similar symptoms.  He was seen by cardiology at that time.  The patient was fluid resuscitated.  The patient was started on midodrine.  He was discharged home with midodrine 15 mg 3 times daily.  His digoxin was continued as well as his rivaroxaban.  The patient states that he had some symptomatic improvement at the time of the discharge although he did have some episodes of tachycardia when he would get up with exertion. In the past 2 days, he has noted that his systolic blood pressure has had readings in the 80s.  He has noted some dizziness and generalized weakness which prompted him to check his blood pressure.  He states his oral intake is poor.  He denies any fevers, chills, chest pain, coughing, hemoptysis, nausea, vomiting or diarrhea, hematochezia, melena.  He is chronically short of breath with exertion.  He states this has not really changed.  He complains of some periumbilical abdominal pain which she states has been present for "a couple years".  He states that this is worse with exertion.  He is having normal bowel movements. He endorses compliance with his medications since discharge from the hospital.  However, he has noted increasing generalized weakness.  He denies any dysuria or hematuria. At baseline, the patient is able to stand and take some steps with assistance.  He has a caregiver 5 hours a day at least 5 days/week. In the ED, the patient was afebrile and hemodynamically stable with oxygen saturation 94% room  air.  WBC 7.8, hemoglobin 12.9, platelets 229.  Sodium 138, potassium 4.3, bicarbonate 22, serum creatinine 1.33.  AST 29, ALT 16, alk phosphatase 93, total bilirubin 1.1.  Lactic acid 3.2>> 2.1.  Chest x-ray showed bibasilar atelectasis.  CT abdomen pelvis was negative for any acute findings.  Digoxin level 0.7.   Assessment/Plan: Generalized weakness/near syncope/orthostatic hypotension -Repeat orthostatics -Continue midodrine>>increased to 20 mg TID -Recheck B12--471 -Recheck folic acid--26.8 -10/26/2023 TSH 0.97 -PT evaluation>>HHPT -UA--no pyuria -Cortrosyn stimulation test was normal during his last hospitalization -Continue IV fluids -check covid/RSV/Flu--neg   Lactic acidosis -Lactic acid 3.2>>> 2.1 -due to volume depletion/hypoperfusion -Personally reviewed chest x-ray--no consolidation -Follow blood culture--neg to date -UA--no pyuria -Continue judicious IV fluids>>improved -Check PCT 0.16   Permanent atrial fibrillation -Continue digoxin -Continue rivaroxaban -10/01/2023 echo EF 50-55%, mild LAE, mild TR   Chronic back pain/opioid dependence -Continue gabapentin -PDMP reviewed -Norco 10/325, #120, last refill 08/17/23 -Continue Norco   Chronic HFpEF -Judicious IV fluids -Holding furosemide temporarily -10/01/2023 echo EF 50-35%   Morbid obesity -BMI 50.18 -Lifestyle modification   Macrocytic anemia -Hemoglobin is stable -Baseline hemoglobin 12 -10/27/2023 B12 416, folic acid 19.0   GERD -Continue pantoprazole   OSA -Continue CPAP               Family Communication:  no Family at bedside   Consultants:  cardiology   Code Status:  FULL    DVT Prophylaxis: xarelto     Procedures: As Listed in Progress Note Above   Antibiotics: None  Subjective:  Pt states dizziness is improving.  Denies f/c, cp, n/v/d, sob, headache Objective: Vitals:   11/06/23 1844 11/06/23 2044 11/07/23 0059 11/07/23 0405  BP: (!) 97/52 132/68 124/66  122/68  Pulse: 74 68 72 76  Resp:  14 16 16   Temp: 99.3 F (37.4 C) (!) 97.4 F (36.3 C) 98.2 F (36.8 C) 98 F (36.7 C)  TempSrc: Oral Oral Oral Oral  SpO2: 97% 95% 94% 95%  Weight:    (!) 154.1 kg  Height:        Intake/Output Summary (Last 24 hours) at 11/07/2023 1538 Last data filed at 11/07/2023 1231 Gross per 24 hour  Intake 720 ml  Output 2600 ml  Net -1880 ml   Weight change: 4.4 kg Exam:  General:  Pt is alert, follows commands appropriately, not in acute distress HEENT: No icterus, No thrush, No neck mass, [place]/AT Cardiovascular: RRR, S1/S2, no rubs, no gallops Respiratory: CTA bilaterally, no wheezing, no crackles, no rhonchi Abdomen: Soft/+BS, non tender, non distended, no guarding Extremities: No edema, No lymphangitis, No petechiae, No rashes, no synovitis   Data Reviewed: I have personally reviewed following labs and imaging studies Basic Metabolic Panel: Recent Labs  Lab 11/05/23 2047 11/07/23 0355  NA 138 140  K 4.3 4.6  CL 102 105  CO2 22 24  GLUCOSE 134* 95  BUN 26* 19  CREATININE 1.33* 1.08  CALCIUM 9.6 9.1  MG  --  2.3  PHOS  --  3.9   Liver Function Tests: Recent Labs  Lab 11/05/23 2047 11/07/23 0355  AST 29 20  ALT 16 14  ALKPHOS 93 72  BILITOT 1.1 0.5  PROT 8.0 6.4*  ALBUMIN 3.5 2.8*   No results for input(s): "LIPASE", "AMYLASE" in the last 168 hours. No results for input(s): "AMMONIA" in the last 168 hours. Coagulation Profile: No results for input(s): "INR", "PROTIME" in the last 168 hours. CBC: Recent Labs  Lab 11/05/23 2047 11/07/23 0355  WBC 7.8 5.2  NEUTROABS 6.2  --   HGB 12.9* 11.4*  HCT 41.2 37.4*  MCV 101.0* 102.7*  PLT 229 185   Cardiac Enzymes: Recent Labs  Lab 11/06/23 0813  CKTOTAL 54   BNP: Invalid input(s): "POCBNP" CBG: No results for input(s): "GLUCAP" in the last 168 hours. HbA1C: No results for input(s): "HGBA1C" in the last 72 hours. Urine analysis:    Component Value Date/Time    COLORURINE YELLOW 11/06/2023 0810   APPEARANCEUR CLEAR 11/06/2023 0810   APPEARANCEUR Clear 01/18/2018 1539   LABSPEC 1.030 11/06/2023 0810   PHURINE 7.0 11/06/2023 0810   GLUCOSEU NEGATIVE 11/06/2023 0810   HGBUR NEGATIVE 11/06/2023 0810   BILIRUBINUR NEGATIVE 11/06/2023 0810   BILIRUBINUR Negative 01/18/2018 1539   KETONESUR NEGATIVE 11/06/2023 0810   PROTEINUR NEGATIVE 11/06/2023 0810   UROBILINOGEN 0.2 09/06/2012 0213   NITRITE NEGATIVE 11/06/2023 0810   LEUKOCYTESUR NEGATIVE 11/06/2023 0810   Sepsis Labs: @LABRCNTIP (procalcitonin:4,lacticidven:4) ) Recent Results (from the past 240 hours)  Culture, blood (routine x 2)     Status: None (Preliminary result)   Collection Time: 11/05/23  8:45 PM   Specimen: BLOOD  Result Value Ref Range Status   Specimen Description BLOOD BLOOD LEFT ARM  Final   Special Requests   Final    BOTTLES DRAWN AEROBIC ONLY Blood Culture results may not be optimal due to an inadequate volume of blood received in culture bottles   Culture   Final    NO GROWTH 2 DAYS Performed at  [facility], [address], [place], [place] [address]    Report Status PENDING  Incomplete  Culture, blood (routine x 2)     Status: None (Preliminary result)   Collection Time: 11/05/23  8:47 PM   Specimen: BLOOD  Result Value Ref Range Status   Specimen Description BLOOD BLOOD RIGHT ARM  Final   Special Requests   Final    BOTTLES DRAWN AEROBIC AND ANAEROBIC Blood Culture adequate volume   Culture   Final    NO GROWTH 2 DAYS Performed at [facility], [address] [place], [place] [address]    Report Status PENDING  Incomplete  Resp panel by RT-PCR (RSV, Flu A&B, Covid) Anterior Nasal Swab     Status: None   Collection Time: 11/06/23  8:10 AM   Specimen: Anterior Nasal Swab  Result Value Ref Range Status   SARS Coronavirus 2 by RT PCR NEGATIVE NEGATIVE Final    Comment: (NOTE) SARS-CoV-2 target nucleic acids are NOT DETECTED.  The SARS-CoV-2 RNA is generally  detectable in upper respiratory specimens during the acute phase of infection. The lowest concentration of SARS-CoV-2 viral copies this assay can detect is 138 copies/mL. A negative result does not preclude SARS-Cov-2 infection and should not be used as the sole basis for treatment or other patient management decisions. A negative result may occur with  improper specimen collection/handling, submission of specimen other than nasopharyngeal swab, presence of viral mutation(s) within the areas targeted by this assay, and inadequate number of viral copies(<138 copies/mL). A negative result must be combined with clinical observations, patient history, and epidemiological information. The expected result is Negative.  Fact Sheet for Patients:  BloggerCourse.com  Fact Sheet for Healthcare Providers:  SeriousBroker.it  This test is no t yet approved or cleared by the [place] FDA and  has been authorized for detection and/or diagnosis of SARS-CoV-2 by FDA under an Emergency Use Authorization (EUA). This EUA will remain  in effect (meaning this test can be used) for the duration of the COVID-19 declaration under Section 564(b)(1) of the Act, 21 U.S.C.section 360bbb-3(b)(1), unless the authorization is terminated  or revoked sooner.       Influenza A by PCR NEGATIVE NEGATIVE Final   Influenza B by PCR NEGATIVE NEGATIVE Final    Comment: (NOTE) The Xpert Xpress SARS-CoV-2/FLU/RSV plus assay is intended as an aid in the diagnosis of influenza from Nasopharyngeal swab specimens and should not be used as a sole basis for treatment. Nasal washings and aspirates are unacceptable for Xpert Xpress SARS-CoV-2/FLU/RSV testing.  Fact Sheet for Patients: BloggerCourse.com  Fact Sheet for Healthcare Providers: SeriousBroker.it  This test is not yet approved or cleared by the [place] FDA  and has been authorized for detection and/or diagnosis of SARS-CoV-2 by FDA under an Emergency Use Authorization (EUA). This EUA will remain in effect (meaning this test can be used) for the duration of the COVID-19 declaration under Section 564(b)(1) of the Act, 21 U.S.C. section 360bbb-3(b)(1), unless the authorization is terminated or revoked.     Resp Syncytial Virus by PCR NEGATIVE NEGATIVE Final    Comment: (NOTE) Fact Sheet for Patients: BloggerCourse.com  Fact Sheet for Healthcare Providers: SeriousBroker.it  This test is not yet approved or cleared by the [place] FDA and has been authorized for detection and/or diagnosis of SARS-CoV-2 by FDA under an Emergency Use Authorization (EUA). This EUA will remain in effect (meaning this test can be used) for the duration of the COVID-19 declaration under  Section 564(b)(1) of the Act, 21 U.S.C. section 360bbb-3(b)(1), unless the authorization is terminated or revoked.  Performed at [facility], [address] [place], [place] [address]      Scheduled Meds:  digoxin  0.25 mg Oral Daily   melatonin  6 mg Oral QHS   midodrine  20 mg Oral TID WC   pantoprazole  40 mg Oral Daily   rivaroxaban  20 mg Oral Q supper   Continuous Infusions:  lactated ringers 100 mL/hr at 11/07/23 9147    Procedures/Studies: US Carotid Bilateral Result Date: 11/06/2023 CLINICAL DATA:  Syncope EXAM: BILATERAL CAROTID DUPLEX ULTRASOUND TECHNIQUE: Wallace Cullens scale imaging, color Doppler and duplex ultrasound were performed of bilateral carotid and vertebral arteries in the neck. COMPARISON:  09/26/2020 carotid ultrasound FINDINGS: Criteria: Quantification of carotid stenosis is based on velocity parameters that correlate the residual internal carotid diameter with NASCET-based stenosis levels, using the diameter of the distal internal carotid lumen as the denominator for stenosis measurement. The following  velocity measurements were obtained: RIGHT ICA: 63 cm/sec CCA: 99 cm/sec SYSTOLIC ICA/CCA RATIO:  0.6 ECA: 126 cm/sec LEFT ICA: 76 cm/sec CCA: 98 cm/sec SYSTOLIC ICA/CCA RATIO:  0.8 ECA: 142 cm/sec RIGHT CAROTID ARTERY: Antegrade flow. Mild intimal thickening and plaque. RIGHT VERTEBRAL ARTERY:  Antegrade flow LEFT CAROTID ARTERY: Antegrade flow. Mild intimal thickening and plaque. Some calcification. LEFT VERTEBRAL ARTERY:  Antegrade flow IMPRESSION: Mild bilateral atherosclerotic plaque and intimal thickening. No hemodynamic significant stenosis seen of either internal carotid artery. Electronically Signed   By: Karen Kays M.D.   On: 11/06/2023 14:00   CT ABDOMEN PELVIS W CONTRAST Result Date: 11/06/2023 CLINICAL DATA:  Acute nonlocalized abdominal pain EXAM: CT ABDOMEN AND PELVIS WITH CONTRAST TECHNIQUE: Multidetector CT imaging of the abdomen and pelvis was performed using the standard protocol following bolus administration of intravenous contrast. RADIATION DOSE REDUCTION: This exam was performed according to the departmental dose-optimization program which includes automated exposure control, adjustment of the mA and/or kV according to patient size and/or use of iterative reconstruction technique. CONTRAST:  OMNIPAQUE IOHEXOL 300 MG/ML  SOLN COMPARISON:  01/09/2015 FINDINGS: Lower chest: Pleural thickening and calcifications seen at the lung bases bilaterally are in keeping with bilateral fibrothorax. Superimposed bibasilar parenchymal scarring is seen. Mild coronary artery calcification. Global cardiac size within limits. Hepatobiliary: Status post cholecystectomy. Mild intrahepatic and moderate extrahepatic biliary ductal dilation is unchanged from prior examination likely representing post cholecystectomy change. Liver unremarkable., Pancreas: Unremarkable Spleen: Unremarkable Adrenals/Urinary Tract: The adrenal glands are unremarkable. There is mild bilateral progressive renal cortical atrophy.  Kidneys are normal in position. No hydronephrosis. No intrarenal or ureteral calculi. Simple cortical cyst noted arising from the lower pole the right kidney for which no follow-up imaging is recommended. Bladder is partially obscured by streak artifact however the visualized portion is unremarkable. Stomach/Bowel: Surgical changes of Roux-en-Y gastric bypass are identified. Appendix is absent. The stomach, small bowel, and large bowel are otherwise unremarkable. No evidence of obstruction or focal inflammation. No free intraperitoneal gas or fluid. Vascular/Lymphatic: Aortic atherosclerosis. No enlarged abdominal or pelvic lymph nodes. Reproductive: Obscured by streak artifact. Other: No abdominal wall hernia Musculoskeletal: Status post bilateral total hip arthroplasty. Osseous structures are age-appropriate. IMPRESSION: 1. No acute intra-abdominal pathology identified. No definite radiographic explanation for the patient's reported symptoms. 2. Bilateral fibrothorax. 3. Mild coronary artery calcification. 4. Surgical changes of Roux-en-Y gastric bypass. 5. Mild bilateral progressive renal cortical atrophy. Aortic Atherosclerosis (ICD10-I70.0). Electronically Signed   By: Helyn Numbers  M.D.   On: 11/06/2023 00:52   DG Chest Portable 1 View Result Date: 11/05/2023 CLINICAL DATA:  Shortness of breath and dizziness. EXAM: PORTABLE CHEST 1 VIEW COMPARISON:  October 25, 2023. FINDINGS: The cardiac silhouette is enlarged and unchanged in size. Stable, chronic left perihilar scarring is seen. Mild scarring, atelectasis and/or infiltrate is also noted within the bilateral lung bases. Small bilateral pleural effusions are suspected with small pleural based calcifications seen along the periphery of the left lower lobe. No pneumothorax is identified. Multilevel degenerative changes seen throughout the thoracic spine. IMPRESSION: 1. Stable cardiomegaly with mild bibasilar scarring, atelectasis and/or infiltrate. 2. Small  bilateral pleural effusions. Electronically Signed   By: Aram Candela M.D.   On: 11/05/2023 23:10   CT Head Wo Contrast Result Date: 10/25/2023 CLINICAL DATA:  Syncope/presyncope, cerebrovascular cause suspected EXAM: CT HEAD WITHOUT CONTRAST TECHNIQUE: Contiguous axial images were obtained from the base of the skull through the vertex without intravenous contrast. RADIATION DOSE REDUCTION: This exam was performed according to the departmental dose-optimization program which includes automated exposure control, adjustment of the mA and/or kV according to patient size and/or use of iterative reconstruction technique. COMPARISON:  None Available. FINDINGS: Brain: No evidence of acute infarction, hemorrhage, hydrocephalus, extra-axial collection or mass lesion/mass effect. Patchy white matter hypodensities are nonspecific but compatible with chronic microvascular ischemic change. Vascular: No mastoid effusions. Skull: No acute fracture. Sinuses/Orbits: Right maxillary sinus mucosal thickening no acute orbital findings. Other: No mastoid effusions. IMPRESSION: No evidence of acute intracranial abnormality. Electronically Signed   By: Feliberto Harts M.D.   On: 10/25/2023 15:17   DG Chest Port 1 View Result Date: 10/25/2023 CLINICAL DATA:  Syncope, chest pain, hypotension EXAM: PORTABLE CHEST 1 VIEW COMPARISON:  09/25/2020 FINDINGS: 2 frontal views of the chest demonstrate a stable enlarged cardiac silhouette. Continued ectasia and atherosclerosis of the thoracic aorta. There is chronic scarring within the left perihilar region, with diffuse left-sided pleural calcifications unchanged. No acute airspace disease, effusion, or pneumothorax. No acute bony abnormalities. IMPRESSION: 1. Chronic left pleural calcifications and left perihilar scarring. No acute intrathoracic process. Electronically Signed   By: Sharlet Salina M.D.   On: 10/25/2023 15:07    Catarina Hartshorn, DO  [facility]  If 7PM-7AM, please  contact night-coverage [email or web address] Password [email or web address] 11/07/2023, 3:38 PM   LOS: 1 day

## 2023-11-07 NOTE — Progress Notes (Signed)
*  PRELIMINARY RESULTS* Echocardiogram 2D Echocardiogram has been performed.  [name] 11/07/2023, 1:42 PM

## 2023-11-08 DIAGNOSIS — F1120 Opioid dependence, uncomplicated: Secondary | ICD-10-CM | POA: Diagnosis not present

## 2023-11-08 DIAGNOSIS — I5032 Chronic diastolic (congestive) heart failure: Secondary | ICD-10-CM | POA: Diagnosis not present

## 2023-11-08 DIAGNOSIS — R55 Syncope and collapse: Secondary | ICD-10-CM | POA: Diagnosis not present

## 2023-11-08 LAB — ECHOCARDIOGRAM COMPLETE
AR max vel: 2.44 cm2
AV Area VTI: 2.75 cm2
AV Area mean vel: 2.6 cm2
AV Mean grad: 2 mmHg
AV Peak grad: 4.4 mmHg
Ao pk vel: 1.05 m/s
Area-P 1/2: 3.95 cm2
Est EF: 55
Height: 68 in
MV VTI: 2.05 cm2
S' Lateral: 2.4 cm
Weight: 5435.66 [oz_av]

## 2023-11-08 MED ORDER — METOCLOPRAMIDE HCL 5 MG/ML IJ SOLN
5.0000 mg | Freq: Once | INTRAMUSCULAR | Status: AC
  Administered 2023-11-08: 5 mg via INTRAVENOUS
  Filled 2023-11-08: qty 2

## 2023-11-08 MED ORDER — PROCHLORPERAZINE EDISYLATE 10 MG/2ML IJ SOLN
10.0000 mg | Freq: Four times a day (QID) | INTRAMUSCULAR | Status: DC
  Administered 2023-11-08 – 2023-11-09 (×4): 10 mg via INTRAVENOUS
  Filled 2023-11-08 (×5): qty 2

## 2023-11-08 MED ORDER — GABAPENTIN 300 MG PO CAPS
300.0000 mg | ORAL_CAPSULE | Freq: Three times a day (TID) | ORAL | Status: DC
Start: 2023-11-08 — End: 2023-11-09
  Administered 2023-11-08 – 2023-11-09 (×2): 300 mg via ORAL
  Filled 2023-11-08 (×2): qty 1

## 2023-11-08 MED ORDER — FENTANYL CITRATE PF 50 MCG/ML IJ SOSY
25.0000 ug | PREFILLED_SYRINGE | INTRAMUSCULAR | Status: DC | PRN
  Administered 2023-11-08 – 2023-11-09 (×3): 25 ug via INTRAVENOUS
  Filled 2023-11-08 (×4): qty 1

## 2023-11-08 NOTE — Progress Notes (Addendum)
 PROGRESS NOTE  [name] UEA:[id number] DOB: 1946-01-27 DOA: 11/05/2023 PCP: Medicine, [facility]  Brief History:  78 year old male with a history of diastolic CHF, permanent atrial fibrillation, OSA on CPAP, morbid obesity status post Roux-en-Y bypass, and orthostatic hypotension presenting with dizziness and low blood pressure with near syncope type symptoms.  The patient was recently mated to the hospital from 10/25/2023 to 10/31/2023 with similar symptoms.  He was seen by cardiology at that time.  The patient was fluid resuscitated.  The patient was started on midodrine.  He was discharged home with midodrine 15 mg 3 times daily.  His digoxin was continued as well as his rivaroxaban.  The patient states that he had some symptomatic improvement at the time of the discharge although he did have some episodes of tachycardia when he would get up with exertion. In the past 2 days, he has noted that his systolic blood pressure has had readings in the 80s.  He has noted some dizziness and generalized weakness which prompted him to check his blood pressure.  He states his oral intake is poor.  He denies any fevers, chills, chest pain, coughing, hemoptysis, nausea, vomiting or diarrhea, hematochezia, melena.  He is chronically short of breath with exertion.  He states this has not really changed.  He complains of some periumbilical abdominal pain which she states has been present for "a couple years".  He states that this is worse with exertion.  He is having normal bowel movements. He endorses compliance with his medications since discharge from the hospital.  However, he has noted increasing generalized weakness.  He denies any dysuria or hematuria. At baseline, the patient is able to stand and take some steps with assistance.  He has a caregiver 5 hours a day at least 5 days/week. In the ED, the patient was afebrile and hemodynamically stable with oxygen saturation 94% room  air.  WBC 7.8, hemoglobin 12.9, platelets 229.  Sodium 138, potassium 4.3, bicarbonate 22, serum creatinine 1.33.  AST 29, ALT 16, alk phosphatase 93, total bilirubin 1.1.  Lactic acid 3.2>> 2.1.  Chest x-ray showed bibasilar atelectasis.  CT abdomen pelvis was negative for any acute findings.  Digoxin level 0.7.   Assessment/Plan:  Generalized weakness/near syncope/orthostatic hypotension -Repeat orthostatics -Continue midodrine>>increased to 20 mg TID -overall clinically improved -Recheck B12--471 -Recheck folic acid--26.8 -10/26/2023 TSH 0.97 -PT evaluation>>HHPT -UA--no pyuria -Cortrosyn stimulation test was normal during his last hospitalization -Continue IV fluids -check covid/RSV/Flu--neg -3/8 Echo--EF 55%, no WMA, normal RVF  Nausea -multifactorial including opioids, ?midodrine, GERD -ATC compazine -increase pantoprazole to bid -reglan x 1 -COVID/RSV/Flu--neg   Lactic acidosis -Lactic acid 3.2>>> 2.1 -due to volume depletion/hypoperfusion -Personally reviewed chest x-ray--no consolidation -Follow blood culture--neg to date -UA--no pyuria -Continue judicious IV fluids>>improved -Check PCT 0.16   Permanent atrial fibrillation -Continue digoxin -Continue rivaroxaban -10/01/2023 echo EF 50-55%, mild LAE, mild TR   Chronic back pain/opioid dependence -Continue gabapentin -PDMP reviewed -Norco 10/325, #120, last refill 08/17/23 -Continue Norco   Chronic HFpEF -Judicious IV fluids -Holding furosemide temporarily -10/01/2023 echo EF 50-35%   Morbid obesity -BMI 50.18 -Lifestyle modification   Macrocytic anemia -Hemoglobin is stable -Baseline hemoglobin 12 -10/27/2023 B12 416, folic acid 19.0   GERD -Continue pantoprazole   OSA -Continue CPAP   Media epicondylitis -OT eval -outpt ortho follow up             Family Communication:  no Family  at bedside   Consultants:  cardiology   Code Status:  FULL    DVT Prophylaxis: xarelto      Procedures: As Listed in Progress Note Above   Antibiotics: None         Subjective: Patient denies fevers, chills, headache, chest pain, dyspnea,  vomiting, diarrhea, abdominal pain, dysuria, hematuria, hematochezia, and melena.   Objective: Vitals:   11/07/23 1600 11/07/23 2043 11/08/23 0417 11/08/23 0516  BP: (!) 144/68 (!) 116/50 137/82   Pulse: 60 76    Resp: 18 18 16    Temp: 98 F (36.7 C) 97.8 F (36.6 C) 97.9 F (36.6 C)   TempSrc: Oral Oral Oral   SpO2: 97% 95% 97%   Weight:    (!) 158.4 kg  Height:        Intake/Output Summary (Last 24 hours) at 11/08/2023 1257 Last data filed at 11/08/2023 0500 Gross per 24 hour  Intake 3991.99 ml  Output 1050 ml  Net 2941.99 ml   Weight change: 4.3 kg Exam:  General:  Pt is alert, follows commands appropriately, not in acute distress HEENT: No icterus, No thrush, No neck mass, [place]/AT Cardiovascular: RRR, S1/S2, no rubs, no gallops Respiratory: CTA bilaterally, no wheezing, no crackles, no rhonchi Abdomen: Soft/+BS, non tender, non distended, no guarding Extremities: No edema, No lymphangitis, No petechiae, No rashes, no synovitis; tender right medial epicondyle--no erythema, no synovitis   Data Reviewed: I have personally reviewed following labs and imaging studies Basic Metabolic Panel: Recent Labs  Lab 11/05/23 2047 11/07/23 0355  NA 138 140  K 4.3 4.6  CL 102 105  CO2 22 24  GLUCOSE 134* 95  BUN 26* 19  CREATININE 1.33* 1.08  CALCIUM 9.6 9.1  MG  --  2.3  PHOS  --  3.9   Liver Function Tests: Recent Labs  Lab 11/05/23 2047 11/07/23 0355  AST 29 20  ALT 16 14  ALKPHOS 93 72  BILITOT 1.1 0.5  PROT 8.0 6.4*  ALBUMIN 3.5 2.8*   No results for input(s): "LIPASE", "AMYLASE" in the last 168 hours. No results for input(s): "AMMONIA" in the last 168 hours. Coagulation Profile: No results for input(s): "INR", "PROTIME" in the last 168 hours. CBC: Recent Labs  Lab 11/05/23 2047 11/07/23 0355   WBC 7.8 5.2  NEUTROABS 6.2  --   HGB 12.9* 11.4*  HCT 41.2 37.4*  MCV 101.0* 102.7*  PLT 229 185   Cardiac Enzymes: Recent Labs  Lab 11/06/23 0813  CKTOTAL 54   BNP: Invalid input(s): "POCBNP" CBG: No results for input(s): "GLUCAP" in the last 168 hours. HbA1C: No results for input(s): "HGBA1C" in the last 72 hours. Urine analysis:    Component Value Date/Time   COLORURINE YELLOW 11/06/2023 0810   APPEARANCEUR CLEAR 11/06/2023 0810   APPEARANCEUR Clear 01/18/2018 1539   LABSPEC 1.030 11/06/2023 0810   PHURINE 7.0 11/06/2023 0810   GLUCOSEU NEGATIVE 11/06/2023 0810   HGBUR NEGATIVE 11/06/2023 0810   BILIRUBINUR NEGATIVE 11/06/2023 0810   BILIRUBINUR Negative 01/18/2018 1539   KETONESUR NEGATIVE 11/06/2023 0810   PROTEINUR NEGATIVE 11/06/2023 0810   UROBILINOGEN 0.2 09/06/2012 0213   NITRITE NEGATIVE 11/06/2023 0810   LEUKOCYTESUR NEGATIVE 11/06/2023 0810   Sepsis Labs: @LABRCNTIP (procalcitonin:4,lacticidven:4) ) Recent Results (from the past 240 hours)  Culture, blood (routine x 2)     Status: None (Preliminary result)   Collection Time: 11/05/23  8:45 PM   Specimen: BLOOD  Result Value Ref Range Status   Specimen Description  BLOOD BLOOD LEFT ARM  Final   Special Requests   Final    BOTTLES DRAWN AEROBIC ONLY Blood Culture results may not be optimal due to an inadequate volume of blood received in culture bottles   Culture   Final    NO GROWTH 3 DAYS Performed at [facility], [address], [place], [place] [address]    Report Status PENDING  Incomplete  Culture, blood (routine x 2)     Status: None (Preliminary result)   Collection Time: 11/05/23  8:47 PM   Specimen: BLOOD  Result Value Ref Range Status   Specimen Description BLOOD BLOOD RIGHT ARM  Final   Special Requests   Final    BOTTLES DRAWN AEROBIC AND ANAEROBIC Blood Culture adequate volume   Culture   Final    NO GROWTH 3 DAYS Performed at [facility], [address], [place], [place]  [address]    Report Status PENDING  Incomplete  Resp panel by RT-PCR (RSV, Flu A&B, Covid) Anterior Nasal Swab     Status: None   Collection Time: 11/06/23  8:10 AM   Specimen: Anterior Nasal Swab  Result Value Ref Range Status   SARS Coronavirus 2 by RT PCR NEGATIVE NEGATIVE Final    Comment: (NOTE) SARS-CoV-2 target nucleic acids are NOT DETECTED.  The SARS-CoV-2 RNA is generally detectable in upper respiratory specimens during the acute phase of infection. The lowest concentration of SARS-CoV-2 viral copies this assay can detect is 138 copies/mL. A negative result does not preclude SARS-Cov-2 infection and should not be used as the sole basis for treatment or other patient management decisions. A negative result may occur with  improper specimen collection/handling, submission of specimen other than nasopharyngeal swab, presence of viral mutation(s) within the areas targeted by this assay, and inadequate number of viral copies(<138 copies/mL). A negative result must be combined with clinical observations, patient history, and epidemiological information. The expected result is Negative.  Fact Sheet for Patients:  BloggerCourse.com  Fact Sheet for Healthcare Providers:  SeriousBroker.it  This test is no t yet approved or cleared by the [place] FDA and  has been authorized for detection and/or diagnosis of SARS-CoV-2 by FDA under an Emergency Use Authorization (EUA). This EUA will remain  in effect (meaning this test can be used) for the duration of the COVID-19 declaration under Section 564(b)(1) of the Act, 21 U.S.C.section 360bbb-3(b)(1), unless the authorization is terminated  or revoked sooner.       Influenza A by PCR NEGATIVE NEGATIVE Final   Influenza B by PCR NEGATIVE NEGATIVE Final    Comment: (NOTE) The Xpert Xpress SARS-CoV-2/FLU/RSV plus assay is intended as an aid in the diagnosis of influenza from  Nasopharyngeal swab specimens and should not be used as a sole basis for treatment. Nasal washings and aspirates are unacceptable for Xpert Xpress SARS-CoV-2/FLU/RSV testing.  Fact Sheet for Patients: BloggerCourse.com  Fact Sheet for Healthcare Providers: SeriousBroker.it  This test is not yet approved or cleared by the [place] FDA and has been authorized for detection and/or diagnosis of SARS-CoV-2 by FDA under an Emergency Use Authorization (EUA). This EUA will remain in effect (meaning this test can be used) for the duration of the COVID-19 declaration under Section 564(b)(1) of the Act, 21 U.S.C. section 360bbb-3(b)(1), unless the authorization is terminated or revoked.     Resp Syncytial Virus by PCR NEGATIVE NEGATIVE Final    Comment: (NOTE) Fact Sheet for Patients: BloggerCourse.com  Fact Sheet for Healthcare Providers:  SeriousBroker.it  This test is not yet approved or cleared by the [place] and has been authorized for detection and/or diagnosis of SARS-CoV-2 by FDA under an Emergency Use Authorization (EUA). This EUA will remain in effect (meaning this test can be used) for the duration of the COVID-19 declaration under Section 564(b)(1) of the Act, 21 U.S.C. section 360bbb-3(b)(1), unless the authorization is terminated or revoked.  Performed at [facility], [address], [place], [place] [address]      Scheduled Meds:  digoxin  0.25 mg Oral Daily   melatonin  6 mg Oral QHS   metoCLOPramide (REGLAN) injection  5 mg Intravenous Once   midodrine  20 mg Oral TID WC   pantoprazole  40 mg Oral BID   prochlorperazine  10 mg Intravenous Q6H   rivaroxaban  20 mg Oral Q supper   Continuous Infusions:  lactated ringers 100 mL/hr at 11/07/23 2217    Procedures/Studies: ECHOCARDIOGRAM COMPLETE Result Date: 11/08/2023    ECHOCARDIOGRAM REPORT    Patient Name:   [name] Date of Exam: 11/07/2023 Medical Rec #:  [id number]     Height:       68.0 in Accession #:    [id number]    Weight:       339.7 lb Date of Birth:  [birth year]     BSA:          2.560 m Patient Age:    77 years      BP:           122/68 mmHg Patient Gender: M             HR:           68 bpm. [facility] Procedure: 2D Echo, Cardiac Doppler and Color Doppler (Both Spectral and Color            Flow Doppler were utilized during procedure). Indications:    Atrial Fibrillation  History:        Patient has prior history of Echocardiogram examinations, most                 recent 09/26/2020. CHF, Arrythmias:Atrial Fibrillation,                 Signs/Symptoms:Shortness of Breath; Risk Factors:Hypertension,                 Sleep Apnea and Former Smoker.  Sonographer:    Mikki Harbor Referring Phys: [id number] VISHNU P MALLIPEDDI  Sonographer Comments: Technically difficult study due to poor echo windows, suboptimal parasternal window, suboptimal apical window, suboptimal subcostal window and patient is obese. Image acquisition challenging due to patient body habitus. IMPRESSIONS  1. Poor quality images EF grossly normal endocardium not visualized . Left ventricular ejection fraction, by estimation, is 55%. The left ventricle has normal function. The left ventricle has no regional wall motion abnormalities. There is mild left ventricular hypertrophy. Left ventricular diastolic parameters are indeterminate.  2. Right ventricular systolic function is normal. The right ventricular size is mildly enlarged. Mildly increased right ventricular wall thickness.  3. The mitral valve is normal in structure. No evidence of mitral valve regurgitation. No evidence of mitral stenosis.  4. The aortic valve is tricuspid. There is mild calcification of the aortic valve. There is mild thickening of the aortic valve. Aortic valve regurgitation is not visualized. Aortic valve sclerosis is present, with no  evidence of aortic valve stenosis.  5. The inferior vena cava is  normal in size with greater than 50% respiratory variability, suggesting right atrial pressure of 3 mmHg. FINDINGS  Left Ventricle: Poor quality images EF grossly normal endocardium not visualized. Left ventricular ejection fraction, by estimation, is 55%. The left ventricle has normal function. The left ventricle has no regional wall motion abnormalities. Strain was  performed and the global longitudinal strain is indeterminate. The left ventricular internal cavity size was normal in size. There is mild left ventricular hypertrophy. Left ventricular diastolic parameters are indeterminate. Right Ventricle: The right ventricular size is mildly enlarged. Mildly increased right ventricular wall thickness. Right ventricular systolic function is normal. Left Atrium: Left atrial size was normal in size. Right Atrium: Right atrial size was normal in size. Pericardium: There is no evidence of pericardial effusion. Mitral Valve: The mitral valve is normal in structure. No evidence of mitral valve regurgitation. No evidence of mitral valve stenosis. MV peak gradient, 3.9 mmHg. The mean mitral valve gradient is 1.0 mmHg. Tricuspid Valve: The tricuspid valve is normal in structure. Tricuspid valve regurgitation is not demonstrated. No evidence of tricuspid stenosis. Aortic Valve: The aortic valve is tricuspid. There is mild calcification of the aortic valve. There is mild thickening of the aortic valve. Aortic valve regurgitation is not visualized. Aortic valve sclerosis is present, with no evidence of aortic valve stenosis. Aortic valve mean gradient measures 2.0 mmHg. Aortic valve peak gradient measures 4.4 mmHg. Aortic valve area, by VTI measures 2.75 cm. Pulmonic Valve: The pulmonic valve was normal in structure. Pulmonic valve regurgitation is not visualized. No evidence of pulmonic stenosis. Aorta: The aortic root is normal in size and structure. Venous:  The inferior vena cava is normal in size with greater than 50% respiratory variability, suggesting right atrial pressure of 3 mmHg. IAS/Shunts: The interatrial septum was not well visualized. Additional Comments: 3D was performed not requiring image post processing on an independent workstation and was indeterminate.  LEFT VENTRICLE PLAX 2D LVIDd:         4.70 cm LVIDs:         2.40 cm LV PW:         1.10 cm LV IVS:        1.20 cm LVOT diam:     2.00 cm LV SV:         46 LV SV Index:   18 LVOT Area:     3.14 cm  LEFT ATRIUM         Index LA diam:    3.40 cm 1.33 cm/m  AORTIC VALVE AV Area (Vmax):    2.44 cm AV Area (Vmean):   2.60 cm AV Area (VTI):     2.75 cm AV Vmax:           105.00 cm/s AV Vmean:          59.700 cm/s AV VTI:            0.169 m AV Peak Grad:      4.4 mmHg AV Mean Grad:      2.0 mmHg LVOT Vmax:         81.60 cm/s LVOT Vmean:        49.500 cm/s LVOT VTI:          0.148 m LVOT/AV VTI ratio: 0.88  AORTA Ao Root diam: 4.00 cm MITRAL VALVE MV Area (PHT): 3.95 cm    SHUNTS MV Area VTI:   2.05 cm    Systemic VTI:  0.15 m MV Peak grad:  3.9 mmHg  Systemic Diam: 2.00 cm MV Mean grad:  1.0 mmHg MV Vmax:       0.99 m/s MV Vmean:      44.5 cm/s MV Decel Time: 192 msec MV E velocity: 89.50 cm/s Charlton Haws MD Electronically signed by Charlton Haws MD Signature Date/Time: 11/08/2023/9:59:51 AM    Final    US Carotid Bilateral Result Date: 11/06/2023 CLINICAL DATA:  Syncope EXAM: BILATERAL CAROTID DUPLEX ULTRASOUND TECHNIQUE: Wallace Cullens scale imaging, color Doppler and duplex ultrasound were performed of bilateral carotid and vertebral arteries in the neck. COMPARISON:  09/26/2020 carotid ultrasound FINDINGS: Criteria: Quantification of carotid stenosis is based on velocity parameters that correlate the residual internal carotid diameter with NASCET-based stenosis levels, using the diameter of the distal internal carotid lumen as the denominator for stenosis measurement. The following velocity measurements  were obtained: RIGHT ICA: 63 cm/sec CCA: 99 cm/sec SYSTOLIC ICA/CCA RATIO:  0.6 ECA: 126 cm/sec LEFT ICA: 76 cm/sec CCA: 98 cm/sec SYSTOLIC ICA/CCA RATIO:  0.8 ECA: 142 cm/sec RIGHT CAROTID ARTERY: Antegrade flow. Mild intimal thickening and plaque. RIGHT VERTEBRAL ARTERY:  Antegrade flow LEFT CAROTID ARTERY: Antegrade flow. Mild intimal thickening and plaque. Some calcification. LEFT VERTEBRAL ARTERY:  Antegrade flow IMPRESSION: Mild bilateral atherosclerotic plaque and intimal thickening. No hemodynamic significant stenosis seen of either internal carotid artery. Electronically Signed   By: Karen Kays M.D.   On: 11/06/2023 14:00   CT ABDOMEN PELVIS W CONTRAST Result Date: 11/06/2023 CLINICAL DATA:  Acute nonlocalized abdominal pain EXAM: CT ABDOMEN AND PELVIS WITH CONTRAST TECHNIQUE: Multidetector CT imaging of the abdomen and pelvis was performed using the standard protocol following bolus administration of intravenous contrast. RADIATION DOSE REDUCTION: This exam was performed according to the departmental dose-optimization program which includes automated exposure control, adjustment of the mA and/or kV according to patient size and/or use of iterative reconstruction technique. CONTRAST:  OMNIPAQUE IOHEXOL 300 MG/ML  SOLN COMPARISON:  01/09/2015 FINDINGS: Lower chest: Pleural thickening and calcifications seen at the lung bases bilaterally are in keeping with bilateral fibrothorax. Superimposed bibasilar parenchymal scarring is seen. Mild coronary artery calcification. Global cardiac size within limits. Hepatobiliary: Status post cholecystectomy. Mild intrahepatic and moderate extrahepatic biliary ductal dilation is unchanged from prior examination likely representing post cholecystectomy change. Liver unremarkable., Pancreas: Unremarkable Spleen: Unremarkable Adrenals/Urinary Tract: The adrenal glands are unremarkable. There is mild bilateral progressive renal cortical atrophy. Kidneys are normal in  position. No hydronephrosis. No intrarenal or ureteral calculi. Simple cortical cyst noted arising from the lower pole the right kidney for which no follow-up imaging is recommended. Bladder is partially obscured by streak artifact however the visualized portion is unremarkable. Stomach/Bowel: Surgical changes of Roux-en-Y gastric bypass are identified. Appendix is absent. The stomach, small bowel, and large bowel are otherwise unremarkable. No evidence of obstruction or focal inflammation. No free intraperitoneal gas or fluid. Vascular/Lymphatic: Aortic atherosclerosis. No enlarged abdominal or pelvic lymph nodes. Reproductive: Obscured by streak artifact. Other: No abdominal wall hernia Musculoskeletal: Status post bilateral total hip arthroplasty. Osseous structures are age-appropriate. IMPRESSION: 1. No acute intra-abdominal pathology identified. No definite radiographic explanation for the patient's reported symptoms. 2. Bilateral fibrothorax. 3. Mild coronary artery calcification. 4. Surgical changes of Roux-en-Y gastric bypass. 5. Mild bilateral progressive renal cortical atrophy. Aortic Atherosclerosis (ICD10-I70.0). Electronically Signed   By: Helyn Numbers M.D.   On: 11/06/2023 00:52   DG Chest Portable 1 View Result Date: 11/05/2023 CLINICAL DATA:  Shortness of breath and dizziness. EXAM: PORTABLE CHEST 1 VIEW COMPARISON:  October 25, 2023. FINDINGS: The cardiac silhouette is enlarged and unchanged in size. Stable, chronic left perihilar scarring is seen. Mild scarring, atelectasis and/or infiltrate is also noted within the bilateral lung bases. Small bilateral pleural effusions are suspected with small pleural based calcifications seen along the periphery of the left lower lobe. No pneumothorax is identified. Multilevel degenerative changes seen throughout the thoracic spine. IMPRESSION: 1. Stable cardiomegaly with mild bibasilar scarring, atelectasis and/or infiltrate. 2. Small bilateral pleural  effusions. Electronically Signed   By: Aram Candela M.D.   On: 11/05/2023 23:10   CT Head Wo Contrast Result Date: 10/25/2023 CLINICAL DATA:  Syncope/presyncope, cerebrovascular cause suspected EXAM: CT HEAD WITHOUT CONTRAST TECHNIQUE: Contiguous axial images were obtained from the base of the skull through the vertex without intravenous contrast. RADIATION DOSE REDUCTION: This exam was performed according to the departmental dose-optimization program which includes automated exposure control, adjustment of the mA and/or kV according to patient size and/or use of iterative reconstruction technique. COMPARISON:  None Available. FINDINGS: Brain: No evidence of acute infarction, hemorrhage, hydrocephalus, extra-axial collection or mass lesion/mass effect. Patchy white matter hypodensities are nonspecific but compatible with chronic microvascular ischemic change. Vascular: No mastoid effusions. Skull: No acute fracture. Sinuses/Orbits: Right maxillary sinus mucosal thickening no acute orbital findings. Other: No mastoid effusions. IMPRESSION: No evidence of acute intracranial abnormality. Electronically Signed   By: Feliberto Harts M.D.   On: 10/25/2023 15:17   DG Chest Port 1 View Result Date: 10/25/2023 CLINICAL DATA:  Syncope, chest pain, hypotension EXAM: PORTABLE CHEST 1 VIEW COMPARISON:  09/25/2020 FINDINGS: 2 frontal views of the chest demonstrate a stable enlarged cardiac silhouette. Continued ectasia and atherosclerosis of the thoracic aorta. There is chronic scarring within the left perihilar region, with diffuse left-sided pleural calcifications unchanged. No acute airspace disease, effusion, or pneumothorax. No acute bony abnormalities. IMPRESSION: 1. Chronic left pleural calcifications and left perihilar scarring. No acute intrathoracic process. Electronically Signed   By: Sharlet Salina M.D.   On: 10/25/2023 15:07    Catarina Hartshorn, DO  [facility]  If 7PM-7AM, please contact  night-coverage [email or web address] Password [email or web address] 11/08/2023, 12:57 PM   LOS: 2 days

## 2023-11-08 NOTE — Plan of Care (Signed)

## 2023-11-08 NOTE — Progress Notes (Signed)
 Physical Therapy Treatment Patient Details Name: [name] MRN: [id number] DOB: 1946/06/11 Today's Date: 11/08/2023   History of Present Illness [name] is a 78 y.o. male with medical history significant of diastolic CHF, atrial fibrillation, OSA on CPAP, morbid obesity who presents to the emergency department due to low blood pressure, dizziness, chills, weakness, fatigue, having sensation of going to pass out (but did not pass out).  He was recently admitted from 2/23 to 3/1 due to near syncope, he was noted to have orthostatic changes and palpitation at that time, cardiologist was consulted and recommended continuing digoxin and midodrine.  Patient states that symptoms resolved for a few days after last discharge, but his symptoms have since returned..    PT Comments  Patient demonstrates improvement for sitting up at bedside and less assistance for completing sit to stands and transfers, but limited for ambulation mostly due to c/o nausea - nurse aware. Patient tolerated sitting up in chair after therapy. Patient will benefit from continued skilled physical therapy in hospital and recommended venue below to increase strength, balance, endurance for safe ADLs and gait.      If plan is discharge home, recommend the following: A lot of help with walking and/or transfers;A little help with bathing/dressing/bathroom;Assistance with cooking/housework;Help with stairs or ramp for entrance   Can travel by private vehicle        Equipment Recommendations  None recommended by PT    Recommendations for Other Services       Precautions / Restrictions Precautions Precautions: Fall Restrictions Weight Bearing Restrictions Per Provider Order: No     Mobility  Bed Mobility Overal bed mobility: Needs Assistance Bed Mobility: Supine to Sit     Supine to sit: Min assist, HOB elevated, Contact guard     General bed mobility comments: increased time, labored movement     Transfers Overall transfer level: Needs assistance Equipment used: Rolling walker (2 wheels) Transfers: Sit to/from Stand, Bed to chair/wheelchair/BSC Sit to Stand: Contact guard assist   Step pivot transfers: Contact guard assist       General transfer comment: increased BLE strength for completing sit to stands    Ambulation/Gait Ambulation/Gait assistance: Min assist Gait Distance (Feet): 5 Feet Assistive device: Rolling walker (2 wheels) Gait Pattern/deviations: Decreased step length - right, Decreased step length - left, Decreased stride length, Trunk flexed Gait velocity: slow     General Gait Details: limited to a few side steps without loss of balance, but limited mostly due to c/o nausea   Stairs             Wheelchair Mobility     Tilt Bed    Modified Rankin (Stroke Patients Only)       Balance Overall balance assessment: Needs assistance Sitting-balance support: Feet supported, No upper extremity supported Sitting balance-[name]: Fair Sitting balance - Comments: fair/good seated at EOB   Standing balance support: Reliant on assistive device for balance, During functional activity, Bilateral upper extremity supported Standing balance-[name]: Fair Standing balance comment: using RW                            Communication Communication Communication: No apparent difficulties  Cognition Arousal: Alert Behavior During Therapy: WFL for tasks assessed/performed   PT - Cognitive impairments: No apparent impairments  Following commands: Intact      Cueing Cueing Techniques: Verbal cues, Tactile cues  Exercises General Exercises - Lower Extremity Long Arc Quad: Seated, AROM, Strengthening, Both, 10 reps Toe Raises: Seated, AROM, Strengthening, Both, 10 reps Heel Raises: Seated, AROM, Strengthening, Both, 10 reps    General Comments        Pertinent Vitals/Pain Pain Assessment Pain  Assessment: Faces Faces Pain Scale: Hurts little more Pain Location: left elbow, low back Pain Descriptors / Indicators: Sore, Discomfort Pain Intervention(s): Limited activity within patient's tolerance, Monitored during session, Repositioned    Home Living                          Prior Function            PT Goals (current goals can now be found in the care plan section) Acute Rehab PT Goals Patient Stated Goal: return home with caregivers to assist PT Goal Formulation: With patient Time For Goal Achievement: 11/11/23 Potential to Achieve Goals: Good Progress towards PT goals: Progressing toward goals    Frequency    Min 3X/week      PT Plan      Co-evaluation              AM-PAC PT "6 Clicks" Mobility   Outcome Measure  Help needed turning from your back to your side while in a flat bed without using bedrails?: A Little Help needed moving from lying on your back to sitting on the side of a flat bed without using bedrails?: A Little Help needed moving to and from a bed to a chair (including a wheelchair)?: A Little Help needed standing up from a chair using your arms (e.g., wheelchair or bedside chair)?: A Little Help needed to walk in hospital room?: A Lot Help needed climbing 3-5 steps with a railing? : A Lot 6 Click Score: 16    End of Session   Activity Tolerance: Patient tolerated treatment well;Patient limited by fatigue Patient left: in chair;with call bell/phone within reach Nurse Communication: Mobility status PT Visit Diagnosis: Unsteadiness on feet (R26.81);Other abnormalities of gait and mobility (R26.89);Muscle weakness (generalized) (M62.81)     Time: 9518-8416 PT Time Calculation (min) (ACUTE ONLY): 20 min  Charges:    $Therapeutic Activity: 8-22 mins PT General Charges $$ ACUTE PT VISIT: 1 Visit                     1:21 PM, 11/08/23 Ocie Bob, MPT Physical Therapist with [facility] [address]  [phone] office [phone] mobile phone

## 2023-11-08 NOTE — Plan of Care (Signed)
  Problem: Education: Goal: Knowledge of General Education information will improve Description: Including pain rating scale, medication(s)/side effects and non-pharmacologic comfort measures Outcome: Progressing   Problem: Clinical Measurements: Goal: Ability to maintain clinical measurements within normal limits will improve Outcome: Progressing Goal: Will remain free from infection Outcome: Progressing Goal: Diagnostic test results will improve Outcome: Progressing Goal: Cardiovascular complication will be avoided Outcome: Progressing   Problem: Activity: Goal: Risk for activity intolerance will decrease Outcome: Progressing   Problem: Nutrition: Goal: Adequate nutrition will be maintained Outcome: Progressing   Problem: Coping: Goal: Level of anxiety will decrease Outcome: Progressing   Problem: Elimination: Goal: Will not experience complications related to urinary retention Outcome: Progressing   Problem: Pain Managment: Goal: General experience of comfort will improve and/or be controlled Outcome: Progressing   Problem: Safety: Goal: Ability to remain free from injury will improve Outcome: Progressing   Problem: Skin Integrity: Goal: Risk for impaired skin integrity will decrease Outcome: Progressing

## 2023-11-09 ENCOUNTER — Other Ambulatory Visit (HOSPITAL_COMMUNITY): Payer: Self-pay

## 2023-11-09 ENCOUNTER — Telehealth (HOSPITAL_COMMUNITY): Payer: Self-pay | Admitting: Pharmacy Technician

## 2023-11-09 DIAGNOSIS — R55 Syncope and collapse: Secondary | ICD-10-CM | POA: Diagnosis not present

## 2023-11-09 DIAGNOSIS — N179 Acute kidney failure, unspecified: Secondary | ICD-10-CM | POA: Diagnosis not present

## 2023-11-09 DIAGNOSIS — E872 Acidosis, unspecified: Secondary | ICD-10-CM | POA: Diagnosis not present

## 2023-11-09 MED ORDER — MIDODRINE HCL 10 MG PO TABS: 20.0000 mg | ORAL_TABLET | Freq: Three times a day (TID) | ORAL | 1 refills | Status: DC

## 2023-11-09 NOTE — Plan of Care (Signed)
  Problem: Education: Goal: Knowledge of General Education information will improve Description: Including pain rating scale, medication(s)/side effects and non-pharmacologic comfort measures Outcome: Progressing   Problem: Clinical Measurements: Goal: Ability to maintain clinical measurements within normal limits will improve Outcome: Progressing Goal: Diagnostic test results will improve Outcome: Progressing Goal: Respiratory complications will improve Outcome: Progressing Goal: Cardiovascular complication will be avoided Outcome: Progressing   Problem: Activity: Goal: Risk for activity intolerance will decrease Outcome: Progressing   Problem: Nutrition: Goal: Adequate nutrition will be maintained Outcome: Progressing   Problem: Elimination: Goal: Will not experience complications related to urinary retention Outcome: Progressing   Problem: Pain Managment: Goal: General experience of comfort will improve and/or be controlled Outcome: Progressing   Problem: Safety: Goal: Ability to remain free from injury will improve Outcome: Progressing

## 2023-11-09 NOTE — Progress Notes (Addendum)
 Mobility Specialist Progress Note:    11/09/23 1035  Orthostatic Lying   BP- Lying 125/48  Pulse- Lying 78  Orthostatic Sitting  BP- Sitting 146/53  Pulse- Sitting 80  Orthostatic Standing at 0 minutes  BP- Standing at 0 minutes 97/57  Pulse- Standing at 0 minutes 104  Mobility  Activity Stood at bedside;Dangled on edge of bed  Level of Assistance Minimal assist, patient does 75% or more  Assistive Device Front wheel walker  Range of Motion/Exercises Active;All extremities  Activity Response Tolerated well  Mobility Referral Yes  Mobility visit 1 Mobility  Mobility Specialist Start Time (ACUTE ONLY) 1035  Mobility Specialist Stop Time (ACUTE ONLY) 1055  Mobility Specialist Time Calculation (min) (ACUTE ONLY) 20 min   Pt received in bed, agreeable to mobility. Required MinA to stand with RW. Tolerated well, c/o dizziness when standing. MD Branch at bedside. Returned pt supine, all needs met.   [name] Mobility Specialist Please contact via Special educational needs teacher or  Rehab office at [phone]

## 2023-11-09 NOTE — Discharge Summary (Signed)
 Physician Discharge Summary   Patient: [name] MRN: [id number] DOB: [birth year]  Admit date:     11/05/2023  Discharge date: 11/09/23  Discharge Physician: Onalee Hua Brendan Gadson   PCP: Medicine, [facility]   Recommendations at discharge:   Please follow up with primary care provider within 1-2 weeks  Please repeat BMP and CBC in one week   Hospital Course: 78 year old male with a history of diastolic CHF, permanent atrial fibrillation, OSA on CPAP, morbid obesity status post Roux-en-Y bypass, and orthostatic hypotension presenting with dizziness and low blood pressure with near syncope type symptoms.  The patient was recently mated to the hospital from 10/25/2023 to 10/31/2023 with similar symptoms.  He was seen by cardiology at that time.  The patient was fluid resuscitated.  The patient was started on midodrine.  He was discharged home with midodrine 15 mg 3 times daily.  His digoxin was continued as well as his rivaroxaban.  The patient states that he had some symptomatic improvement at the time of the discharge although he did have some episodes of tachycardia when he would get up with exertion. In the past 2 days, he has noted that his systolic blood pressure has had readings in the 80s.  He has noted some dizziness and generalized weakness which prompted him to check his blood pressure.  He states his oral intake is poor.  He denies any fevers, chills, chest pain, coughing, hemoptysis, nausea, vomiting or diarrhea, hematochezia, melena.  He is chronically short of breath with exertion.  He states this has not really changed.  He complains of some periumbilical abdominal pain which she states has been present for "a couple years".  He states that this is worse with exertion.  He is having normal bowel movements. He endorses compliance with his medications since discharge from the hospital.  However, he has noted increasing generalized weakness.  He denies any dysuria or hematuria. At  baseline, the patient is able to stand and take some steps with assistance.  He has a caregiver 5 hours a day at least 5 days/week. In the ED, the patient was afebrile and hemodynamically stable with oxygen saturation 94% room air.  WBC 7.8, hemoglobin 12.9, platelets 229.  Sodium 138, potassium 4.3, bicarbonate 22, serum creatinine 1.33.  AST 29, ALT 16, alk phosphatase 93, total bilirubin 1.1.  Lactic acid 3.2>> 2.1.  Chest x-ray showed bibasilar atelectasis.  CT abdomen pelvis was negative for any acute findings.  Digoxin level 0.7.  Assessment and Plan: Generalized weakness/near syncope/orthostatic hypotension -Repeat orthostatics remain positive -Continue midodrine>>increased to 20 mg TID -overall clinically and symptomatically better after fluid resuscitation -Recheck B12--471 -Recheck folic acid--26.8 -10/26/2023 TSH 0.97 -PT evaluation>>HHPT -UA--no pyuria -Cortrosyn stimulation test was normal during his last hospitalization -Continue IV fluids -check covid/RSV/Flu--neg -3/8 Echo--EF 55%, no WMA, normal RVF   Nausea -multifactorial including opioids, ?midodrine, GERD -ATC compazine -increase pantoprazole to bid -reglan x 1 -COVID/RSV/Flu--neg -overall improved and tolerating diet   Lactic acidosis -Lactic acid 3.2>>> 2.1 -due to volume depletion/hypoperfusion -Personally reviewed chest x-ray--no consolidation -Follow blood culture--neg to date -UA--no pyuria -Continue judicious IV fluids>>improved -Check PCT 0.16   Permanent atrial fibrillation -Continue digoxin -Continue rivaroxaban -10/01/2023 echo EF 50-55%, mild LAE, mild TR   Chronic back pain/opioid dependence -Continue gabapentin -PDMP reviewed -Norco 10/325, #120, last refill 08/17/23 -Continue Norco   Chronic HFpEF -Judicious IV fluids -Holding furosemide temporarily -10/01/2023 echo EF 50-35%   Morbid obesity -BMI 50.18 -Lifestyle modification  Macrocytic anemia -Hemoglobin is stable -Baseline  hemoglobin 12 -10/27/2023 B12 416, folic acid 19.0   GERD -Continue pantoprazole   OSA -Continue CPAP   Left Medial epicondylitis -OT eval -outpt ortho follow up    Consultants: cardiology Procedures performed: none  Disposition: Home Diet recommendation:  Cardiac diet DISCHARGE MEDICATION: Allergies as of 11/09/2023       Reactions   Bee Venom Anaphylaxis, Other (See Comments)   Noted from [place]. honey bee venom   Nickel Rash, Other (See Comments)   "Breaks me out"        Medication List     TAKE these medications    alum & mag hydroxide-simeth 200-200-20 MG/5ML suspension Commonly known as: MAALOX/MYLANTA Take 15 mLs by mouth every 6 (six) hours as needed for indigestion or heartburn.   CENTRUM ADULTS PO Take 1 tablet by mouth every evening.   digoxin 0.25 MG tablet Commonly known as: LANOXIN Take 1 tablet (0.25 mg total) by mouth daily.   Ensure Max Protein Liqd Take 330 mLs (11 oz total) by mouth 2 (two) times daily.   escitalopram 10 MG tablet Commonly known as: LEXAPRO Take 1 tablet (10 mg total) by mouth daily.   gabapentin 300 MG capsule Commonly known as: NEURONTIN Take 1 capsule (300 mg total) by mouth 2 (two) times daily.   HYDROcodone-acetaminophen 10-325 MG tablet Commonly known as: NORCO Take 0.5-1 tablets by mouth every 6 (six) hours as needed for moderate pain (pain score 4-6).   ibuprofen 200 MG tablet Commonly known as: ADVIL Take 400 mg by mouth every 6 (six) hours as needed for mild pain (pain score 1-3).   levalbuterol 45 MCG/ACT inhaler Commonly known as: XOPENEX HFA Inhale 2 puffs into the lungs every 6 (six) hours as needed for wheezing or shortness of breath.   melatonin 3 MG Tabs tablet Take 2 tablets (6 mg total) by mouth at bedtime. What changed:  how much to take additional instructions   midodrine 10 MG tablet Commonly known as: PROAMATINE Take 2 tablets (20 mg total) by mouth 3 (three) times daily with  meals. What changed:  medication strength how much to take   NYQUIL COLD & FLU PO Take 15 mLs by mouth daily as needed (cold/cough).   pantoprazole 40 MG tablet Commonly known as: PROTONIX Take 1 tablet (40 mg total) by mouth daily.   promethazine 25 MG tablet Commonly known as: PHENERGAN Take 25 mg by mouth every 6 (six) hours as needed for nausea or vomiting.   traZODone 50 MG tablet Commonly known as: DESYREL Take 1 tablet (50 mg total) by mouth at bedtime as needed for sleep.   Xarelto 20 MG Tabs tablet Generic drug: rivaroxaban Take 20 mg by mouth every evening.        Follow-up Information     [place], [facility] Follow up.   Why: Agency will call to set up home therapy visits. Contact information: [address] [place] [address] [phone]                Discharge Exam: Filed Weights   11/07/23 0405 11/08/23 0516 11/09/23 0500  Weight: (!) 154.1 kg (!) 158.4 kg (!) 157.9 kg   HEENT:  [place]/AT, No thrush, no icterus CV:  RRR, no rub, no S3, no S4 Lung:  bibasilar rales. No wheeze Abd:  soft/+BS, NT Ext:  No edema, no lymphangitis, no synovitis, no rash   Condition at discharge: stable  The  results of significant diagnostics from this hospitalization (including imaging, microbiology, ancillary and laboratory) are listed below for reference.   Imaging Studies: ECHOCARDIOGRAM COMPLETE Result Date: 11/08/2023    ECHOCARDIOGRAM REPORT   Patient Name:   [name] Date of Exam: 11/07/2023 Medical Rec #:  [id number]     Height:       68.0 in Accession #:    [id number]    Weight:       339.7 lb Date of Birth:  Aug 03, 1946     BSA:          2.560 m Patient Age:    77 years      BP:           122/68 mmHg Patient Gender: M             HR:           68 bpm. [facility] Procedure: 2D Echo, Cardiac Doppler and Color Doppler (Both Spectral and Color            Flow Doppler were utilized during procedure). Indications:    Atrial  Fibrillation  History:        Patient has prior history of Echocardiogram examinations, most                 recent 09/26/2020. CHF, Arrythmias:Atrial Fibrillation,                 Signs/Symptoms:Shortness of Breath; Risk Factors:Hypertension,                 Sleep Apnea and Former Smoker.  Sonographer:    Mikki Harbor Referring Phys: [id number] VISHNU P MALLIPEDDI  Sonographer Comments: Technically difficult study due to poor echo windows, suboptimal parasternal window, suboptimal apical window, suboptimal subcostal window and patient is obese. Image acquisition challenging due to patient body habitus. IMPRESSIONS  1. Poor quality images EF grossly normal endocardium not visualized . Left ventricular ejection fraction, by estimation, is 55%. The left ventricle has normal function. The left ventricle has no regional wall motion abnormalities. There is mild left ventricular hypertrophy. Left ventricular diastolic parameters are indeterminate.  2. Right ventricular systolic function is normal. The right ventricular size is mildly enlarged. Mildly increased right ventricular wall thickness.  3. The mitral valve is normal in structure. No evidence of mitral valve regurgitation. No evidence of mitral stenosis.  4. The aortic valve is tricuspid. There is mild calcification of the aortic valve. There is mild thickening of the aortic valve. Aortic valve regurgitation is not visualized. Aortic valve sclerosis is present, with no evidence of aortic valve stenosis.  5. The inferior vena cava is normal in size with greater than 50% respiratory variability, suggesting right atrial pressure of 3 mmHg. FINDINGS  Left Ventricle: Poor quality images EF grossly normal endocardium not visualized. Left ventricular ejection fraction, by estimation, is 55%. The left ventricle has normal function. The left ventricle has no regional wall motion abnormalities. Strain was  performed and the global longitudinal strain is indeterminate. The  left ventricular internal cavity size was normal in size. There is mild left ventricular hypertrophy. Left ventricular diastolic parameters are indeterminate. Right Ventricle: The right ventricular size is mildly enlarged. Mildly increased right ventricular wall thickness. Right ventricular systolic function is normal. Left Atrium: Left atrial size was normal in size. Right Atrium: Right atrial size was normal in size. Pericardium: There is no evidence of pericardial effusion. Mitral Valve: The mitral valve is normal in structure.  No evidence of mitral valve regurgitation. No evidence of mitral valve stenosis. MV peak gradient, 3.9 mmHg. The mean mitral valve gradient is 1.0 mmHg. Tricuspid Valve: The tricuspid valve is normal in structure. Tricuspid valve regurgitation is not demonstrated. No evidence of tricuspid stenosis. Aortic Valve: The aortic valve is tricuspid. There is mild calcification of the aortic valve. There is mild thickening of the aortic valve. Aortic valve regurgitation is not visualized. Aortic valve sclerosis is present, with no evidence of aortic valve stenosis. Aortic valve mean gradient measures 2.0 mmHg. Aortic valve peak gradient measures 4.4 mmHg. Aortic valve area, by VTI measures 2.75 cm. Pulmonic Valve: The pulmonic valve was normal in structure. Pulmonic valve regurgitation is not visualized. No evidence of pulmonic stenosis. Aorta: The aortic root is normal in size and structure. Venous: The inferior vena cava is normal in size with greater than 50% respiratory variability, suggesting right atrial pressure of 3 mmHg. IAS/Shunts: The interatrial septum was not well visualized. Additional Comments: 3D was performed not requiring image post processing on an independent workstation and was indeterminate.  LEFT VENTRICLE PLAX 2D LVIDd:         4.70 cm LVIDs:         2.40 cm LV PW:         1.10 cm LV IVS:        1.20 cm LVOT diam:     2.00 cm LV SV:         46 LV SV Index:   18 LVOT Area:      3.14 cm  LEFT ATRIUM         Index LA diam:    3.40 cm 1.33 cm/m  AORTIC VALVE AV Area (Vmax):    2.44 cm AV Area (Vmean):   2.60 cm AV Area (VTI):     2.75 cm AV Vmax:           105.00 cm/s AV Vmean:          59.700 cm/s AV VTI:            0.169 m AV Peak Grad:      4.4 mmHg AV Mean Grad:      2.0 mmHg LVOT Vmax:         81.60 cm/s LVOT Vmean:        49.500 cm/s LVOT VTI:          0.148 m LVOT/AV VTI ratio: 0.88  AORTA Ao Root diam: 4.00 cm MITRAL VALVE MV Area (PHT): 3.95 cm    SHUNTS MV Area VTI:   2.05 cm    Systemic VTI:  0.15 m MV Peak grad:  3.9 mmHg    Systemic Diam: 2.00 cm MV Mean grad:  1.0 mmHg MV Vmax:       0.99 m/s MV Vmean:      44.5 cm/s MV Decel Time: 192 msec MV E velocity: 89.50 cm/s Charlton Haws MD Electronically signed by Charlton Haws MD Signature Date/Time: 11/08/2023/9:59:51 AM    Final    US Carotid Bilateral Result Date: 11/06/2023 CLINICAL DATA:  Syncope EXAM: BILATERAL CAROTID DUPLEX ULTRASOUND TECHNIQUE: Wallace Cullens scale imaging, color Doppler and duplex ultrasound were performed of bilateral carotid and vertebral arteries in the neck. COMPARISON:  09/26/2020 carotid ultrasound FINDINGS: Criteria: Quantification of carotid stenosis is based on velocity parameters that correlate the residual internal carotid diameter with NASCET-based stenosis levels, using the diameter of the distal internal carotid lumen as the denominator for stenosis measurement. The following velocity  measurements were obtained: RIGHT ICA: 63 cm/sec CCA: 99 cm/sec SYSTOLIC ICA/CCA RATIO:  0.6 ECA: 126 cm/sec LEFT ICA: 76 cm/sec CCA: 98 cm/sec SYSTOLIC ICA/CCA RATIO:  0.8 ECA: 142 cm/sec RIGHT CAROTID ARTERY: Antegrade flow. Mild intimal thickening and plaque. RIGHT VERTEBRAL ARTERY:  Antegrade flow LEFT CAROTID ARTERY: Antegrade flow. Mild intimal thickening and plaque. Some calcification. LEFT VERTEBRAL ARTERY:  Antegrade flow IMPRESSION: Mild bilateral atherosclerotic plaque and intimal thickening. No  hemodynamic significant stenosis seen of either internal carotid artery. Electronically Signed   By: Karen Kays M.D.   On: 11/06/2023 14:00   CT ABDOMEN PELVIS W CONTRAST Result Date: 11/06/2023 CLINICAL DATA:  Acute nonlocalized abdominal pain EXAM: CT ABDOMEN AND PELVIS WITH CONTRAST TECHNIQUE: Multidetector CT imaging of the abdomen and pelvis was performed using the standard protocol following bolus administration of intravenous contrast. RADIATION DOSE REDUCTION: This exam was performed according to the departmental dose-optimization program which includes automated exposure control, adjustment of the mA and/or kV according to patient size and/or use of iterative reconstruction technique. CONTRAST:  OMNIPAQUE IOHEXOL 300 MG/ML  SOLN COMPARISON:  01/09/2015 FINDINGS: Lower chest: Pleural thickening and calcifications seen at the lung bases bilaterally are in keeping with bilateral fibrothorax. Superimposed bibasilar parenchymal scarring is seen. Mild coronary artery calcification. Global cardiac size within limits. Hepatobiliary: Status post cholecystectomy. Mild intrahepatic and moderate extrahepatic biliary ductal dilation is unchanged from prior examination likely representing post cholecystectomy change. Liver unremarkable., Pancreas: Unremarkable Spleen: Unremarkable Adrenals/Urinary Tract: The adrenal glands are unremarkable. There is mild bilateral progressive renal cortical atrophy. Kidneys are normal in position. No hydronephrosis. No intrarenal or ureteral calculi. Simple cortical cyst noted arising from the lower pole the right kidney for which no follow-up imaging is recommended. Bladder is partially obscured by streak artifact however the visualized portion is unremarkable. Stomach/Bowel: Surgical changes of Roux-en-Y gastric bypass are identified. Appendix is absent. The stomach, small bowel, and large bowel are otherwise unremarkable. No evidence of obstruction or focal inflammation. No  free intraperitoneal gas or fluid. Vascular/Lymphatic: Aortic atherosclerosis. No enlarged abdominal or pelvic lymph nodes. Reproductive: Obscured by streak artifact. Other: No abdominal wall hernia Musculoskeletal: Status post bilateral total hip arthroplasty. Osseous structures are age-appropriate. IMPRESSION: 1. No acute intra-abdominal pathology identified. No definite radiographic explanation for the patient's reported symptoms. 2. Bilateral fibrothorax. 3. Mild coronary artery calcification. 4. Surgical changes of Roux-en-Y gastric bypass. 5. Mild bilateral progressive renal cortical atrophy. Aortic Atherosclerosis (ICD10-I70.0). Electronically Signed   By: Helyn Numbers M.D.   On: 11/06/2023 00:52   DG Chest Portable 1 View Result Date: 11/05/2023 CLINICAL DATA:  Shortness of breath and dizziness. EXAM: PORTABLE CHEST 1 VIEW COMPARISON:  October 25, 2023. FINDINGS: The cardiac silhouette is enlarged and unchanged in size. Stable, chronic left perihilar scarring is seen. Mild scarring, atelectasis and/or infiltrate is also noted within the bilateral lung bases. Small bilateral pleural effusions are suspected with small pleural based calcifications seen along the periphery of the left lower lobe. No pneumothorax is identified. Multilevel degenerative changes seen throughout the thoracic spine. IMPRESSION: 1. Stable cardiomegaly with mild bibasilar scarring, atelectasis and/or infiltrate. 2. Small bilateral pleural effusions. Electronically Signed   By: Aram Candela M.D.   On: 11/05/2023 23:10   CT Head Wo Contrast Result Date: 10/25/2023 CLINICAL DATA:  Syncope/presyncope, cerebrovascular cause suspected EXAM: CT HEAD WITHOUT CONTRAST TECHNIQUE: Contiguous axial images were obtained from the base of the skull through the vertex without intravenous contrast. RADIATION DOSE REDUCTION: This exam was performed  according to the departmental dose-optimization program which includes automated exposure  control, adjustment of the mA and/or kV according to patient size and/or use of iterative reconstruction technique. COMPARISON:  None Available. FINDINGS: Brain: No evidence of acute infarction, hemorrhage, hydrocephalus, extra-axial collection or mass lesion/mass effect. Patchy white matter hypodensities are nonspecific but compatible with chronic microvascular ischemic change. Vascular: No mastoid effusions. Skull: No acute fracture. Sinuses/Orbits: Right maxillary sinus mucosal thickening no acute orbital findings. Other: No mastoid effusions. IMPRESSION: No evidence of acute intracranial abnormality. Electronically Signed   By: Feliberto Harts M.D.   On: 10/25/2023 15:17   DG Chest Port 1 View Result Date: 10/25/2023 CLINICAL DATA:  Syncope, chest pain, hypotension EXAM: PORTABLE CHEST 1 VIEW COMPARISON:  09/25/2020 FINDINGS: 2 frontal views of the chest demonstrate a stable enlarged cardiac silhouette. Continued ectasia and atherosclerosis of the thoracic aorta. There is chronic scarring within the left perihilar region, with diffuse left-sided pleural calcifications unchanged. No acute airspace disease, effusion, or pneumothorax. No acute bony abnormalities. IMPRESSION: 1. Chronic left pleural calcifications and left perihilar scarring. No acute intrathoracic process. Electronically Signed   By: Sharlet Salina M.D.   On: 10/25/2023 15:07    Microbiology: Results for orders placed or performed during the hospital encounter of 11/05/23  Culture, blood (routine x 2)     Status: None (Preliminary result)   Collection Time: 11/05/23  8:45 PM   Specimen: BLOOD  Result Value Ref Range Status   Specimen Description BLOOD BLOOD LEFT ARM  Final   Special Requests   Final    BOTTLES DRAWN AEROBIC ONLY Blood Culture results may not be optimal due to an inadequate volume of blood received in culture bottles   Culture   Final    NO GROWTH 4 DAYS Performed at [facility], [address], [place],  [place] [address]    Report Status PENDING  Incomplete  Culture, blood (routine x 2)     Status: None (Preliminary result)   Collection Time: 11/05/23  8:47 PM   Specimen: BLOOD  Result Value Ref Range Status   Specimen Description BLOOD BLOOD RIGHT ARM  Final   Special Requests   Final    BOTTLES DRAWN AEROBIC AND ANAEROBIC Blood Culture adequate volume   Culture   Final    NO GROWTH 4 DAYS Performed at [facility], [address], [place], [place] [address]    Report Status PENDING  Incomplete  Resp panel by RT-PCR (RSV, Flu A&B, Covid) Anterior Nasal Swab     Status: None   Collection Time: 11/06/23  8:10 AM   Specimen: Anterior Nasal Swab  Result Value Ref Range Status   SARS Coronavirus 2 by RT PCR NEGATIVE NEGATIVE Final    Comment: (NOTE) SARS-CoV-2 target nucleic acids are NOT DETECTED.  The SARS-CoV-2 RNA is generally detectable in upper respiratory specimens during the acute phase of infection. The lowest concentration of SARS-CoV-2 viral copies this assay can detect is 138 copies/mL. A negative result does not preclude SARS-Cov-2 infection and should not be used as the sole basis for treatment or other patient management decisions. A negative result may occur with  improper specimen collection/handling, submission of specimen other than nasopharyngeal swab, presence of viral mutation(s) within the areas targeted by this assay, and inadequate number of viral copies(<138 copies/mL). A negative result must be combined with clinical observations, patient history, and epidemiological information. The expected result is Negative.  Fact Sheet for Patients:  BloggerCourse.com  Fact Sheet for  Healthcare Providers:  SeriousBroker.it  This test is no t yet approved or cleared by the [place] and  has been authorized for detection and/or diagnosis of SARS-CoV-2 by FDA under an Emergency Use Authorization (EUA). This EUA will  remain  in effect (meaning this test can be used) for the duration of the COVID-19 declaration under Section 564(b)(1) of the Act, 21 U.S.C.section 360bbb-3(b)(1), unless the authorization is terminated  or revoked sooner.       Influenza A by PCR NEGATIVE NEGATIVE Final   Influenza B by PCR NEGATIVE NEGATIVE Final    Comment: (NOTE) The Xpert Xpress SARS-CoV-2/FLU/RSV plus assay is intended as an aid in the diagnosis of influenza from Nasopharyngeal swab specimens and should not be used as a sole basis for treatment. Nasal washings and aspirates are unacceptable for Xpert Xpress SARS-CoV-2/FLU/RSV testing.  Fact Sheet for Patients: BloggerCourse.com  Fact Sheet for Healthcare Providers: SeriousBroker.it  This test is not yet approved or cleared by the [place] FDA and has been authorized for detection and/or diagnosis of SARS-CoV-2 by FDA under an Emergency Use Authorization (EUA). This EUA will remain in effect (meaning this test can be used) for the duration of the COVID-19 declaration under Section 564(b)(1) of the Act, 21 U.S.C. section 360bbb-3(b)(1), unless the authorization is terminated or revoked.     Resp Syncytial Virus by PCR NEGATIVE NEGATIVE Final    Comment: (NOTE) Fact Sheet for Patients: BloggerCourse.com  Fact Sheet for Healthcare Providers: SeriousBroker.it  This test is not yet approved or cleared by the [place] FDA and has been authorized for detection and/or diagnosis of SARS-CoV-2 by FDA under an Emergency Use Authorization (EUA). This EUA will remain in effect (meaning this test can be used) for the duration of the COVID-19 declaration under Section 564(b)(1) of the Act, 21 U.S.C. section 360bbb-3(b)(1), unless the authorization is terminated or revoked.  Performed at [facility], [address] [place], [place] [address]      Labs: CBC: Recent Labs  Lab 11/05/23 2047 11/07/23 0355  WBC 7.8 5.2  NEUTROABS 6.2  --   HGB 12.9* 11.4*  HCT 41.2 37.4*  MCV 101.0* 102.7*  PLT 229 185   Basic Metabolic Panel: Recent Labs  Lab 11/05/23 2047 11/07/23 0355  NA 138 140  K 4.3 4.6  CL 102 105  CO2 22 24  GLUCOSE 134* 95  BUN 26* 19  CREATININE 1.33* 1.08  CALCIUM 9.6 9.1  MG  --  2.3  PHOS  --  3.9   Liver Function Tests: Recent Labs  Lab 11/05/23 2047 11/07/23 0355  AST 29 20  ALT 16 14  ALKPHOS 93 72  BILITOT 1.1 0.5  PROT 8.0 6.4*  ALBUMIN 3.5 2.8*   CBG: No results for input(s): "GLUCAP" in the last 168 hours.  Discharge time spent: greater than 30 minutes.  Signed: Catarina Hartshorn, MD [facility] 11/09/2023

## 2023-11-09 NOTE — Progress Notes (Signed)
 Rounding Note    Patient Name: [name] Date of Encounter: 11/09/2023  [facility] Cardiologist: Tenny Craw  Subjective   Some ongoing dizziness  Inpatient Medications    Scheduled Meds:  digoxin  0.25 mg Oral Daily   gabapentin  300 mg Oral TID   melatonin  6 mg Oral QHS   midodrine  20 mg Oral TID WC   pantoprazole  40 mg Oral BID   prochlorperazine  10 mg Intravenous Q6H   rivaroxaban  20 mg Oral Q supper   Continuous Infusions:  lactated ringers Stopped (11/09/23 0500)   PRN Meds: acetaminophen **OR** acetaminophen, fentaNYL (SUBLIMAZE) injection, HYDROcodone-acetaminophen, ondansetron **OR** ondansetron (ZOFRAN) IV   Vital Signs    Vitals:   11/08/23 1631 11/08/23 1934 11/09/23 0430 11/09/23 0500  BP: 136/65 (!) 143/63 (!) 116/55   Pulse: 80 66 69   Resp:  18 16   Temp: 98.1 F (36.7 C) 98.2 F (36.8 C) 97.6 F (36.4 C)   TempSrc: Oral Oral Oral   SpO2: 97% 98% 95%   Weight:    (!) 157.9 kg  Height:        Intake/Output Summary (Last 24 hours) at 11/09/2023 1014 Last data filed at 11/09/2023 0949 Gross per 24 hour  Intake 3093.13 ml  Output 1650 ml  Net 1443.13 ml      11/09/2023    5:00 AM 11/08/2023    5:16 AM 11/07/2023    4:05 AM  Last 3 Weights  Weight (lbs) 348 lb 1.7 oz 349 lb 3.3 oz 339 lb 11.7 oz  Weight (kg) 157.9 kg 158.4 kg 154.1 kg      Telemetry    NSR - Personally Reviewed  ECG    N/a - Personally Reviewed  Physical Exam   GEN: No acute distress.   Neck: No JVD Cardiac: RRR, no murmurs, rubs, or gallops.  Respiratory: Clear to auscultation bilaterally. GI: Soft, nontender, non-distended  MS: No edema; No deformity. Neuro:  Nonfocal  Psych: Normal affect   Labs    High Sensitivity Troponin:   Recent Labs  Lab 10/25/23 1541 10/25/23 1734  TROPONINIHS 5 7     Chemistry Recent Labs  Lab 11/05/23 2047 11/07/23 0355  NA 138 140  K 4.3 4.6  CL 102 105  CO2 22 24  GLUCOSE 134* 95  BUN 26* 19   CREATININE 1.33* 1.08  CALCIUM 9.6 9.1  MG  --  2.3  PROT 8.0 6.4*  ALBUMIN 3.5 2.8*  AST 29 20  ALT 16 14  ALKPHOS 93 72  BILITOT 1.1 0.5  GFRNONAA 55* >60  ANIONGAP 14 11    Lipids No results for input(s): "CHOL", "TRIG", "HDL", "LABVLDL", "LDLCALC", "CHOLHDL" in the last 168 hours.  Hematology Recent Labs  Lab 11/05/23 2047 11/07/23 0355  WBC 7.8 5.2  RBC 4.08* 3.64*  HGB 12.9* 11.4*  HCT 41.2 37.4*  MCV 101.0* 102.7*  MCH 31.6 31.3  MCHC 31.3 30.5  RDW 14.6 14.6  PLT 229 185   Thyroid No results for input(s): "TSH", "FREET4" in the last 168 hours.  BNPNo results for input(s): "BNP", "PROBNP" in the last 168 hours.  DDimer No results for input(s): "DDIMER" in the last 168 hours.   Radiology    ECHOCARDIOGRAM COMPLETE Result Date: 11/08/2023    ECHOCARDIOGRAM REPORT   Patient Name:   COURTNEY FENLON Date of Exam: 11/07/2023 Medical Rec #:  [id number]     Height:  68.0 in Accession #:    [id number]    Weight:       339.7 lb Date of Birth:  03/08/1946     BSA:          2.560 m Patient Age:    77 years      BP:           122/68 mmHg Patient Gender: M             HR:           68 bpm. [facility] Procedure: 2D Echo, Cardiac Doppler and Color Doppler (Both Spectral and Color            Flow Doppler were utilized during procedure). Indications:    Atrial Fibrillation  History:        Patient has prior history of Echocardiogram examinations, most                 recent 09/26/2020. CHF, Arrythmias:Atrial Fibrillation,                 Signs/Symptoms:Shortness of Breath; Risk Factors:Hypertension,                 Sleep Apnea and Former Smoker.  Sonographer:    Mikki Harbor Referring Phys: [id number] VISHNU P MALLIPEDDI  Sonographer Comments: Technically difficult study due to poor echo windows, suboptimal parasternal window, suboptimal apical window, suboptimal subcostal window and patient is obese. Image acquisition challenging due to patient body habitus. IMPRESSIONS  1.  Poor quality images EF grossly normal endocardium not visualized . Left ventricular ejection fraction, by estimation, is 55%. The left ventricle has normal function. The left ventricle has no regional wall motion abnormalities. There is mild left ventricular hypertrophy. Left ventricular diastolic parameters are indeterminate.  2. Right ventricular systolic function is normal. The right ventricular size is mildly enlarged. Mildly increased right ventricular wall thickness.  3. The mitral valve is normal in structure. No evidence of mitral valve regurgitation. No evidence of mitral stenosis.  4. The aortic valve is tricuspid. There is mild calcification of the aortic valve. There is mild thickening of the aortic valve. Aortic valve regurgitation is not visualized. Aortic valve sclerosis is present, with no evidence of aortic valve stenosis.  5. The inferior vena cava is normal in size with greater than 50% respiratory variability, suggesting right atrial pressure of 3 mmHg. FINDINGS  Left Ventricle: Poor quality images EF grossly normal endocardium not visualized. Left ventricular ejection fraction, by estimation, is 55%. The left ventricle has normal function. The left ventricle has no regional wall motion abnormalities. Strain was  performed and the global longitudinal strain is indeterminate. The left ventricular internal cavity size was normal in size. There is mild left ventricular hypertrophy. Left ventricular diastolic parameters are indeterminate. Right Ventricle: The right ventricular size is mildly enlarged. Mildly increased right ventricular wall thickness. Right ventricular systolic function is normal. Left Atrium: Left atrial size was normal in size. Right Atrium: Right atrial size was normal in size. Pericardium: There is no evidence of pericardial effusion. Mitral Valve: The mitral valve is normal in structure. No evidence of mitral valve regurgitation. No evidence of mitral valve stenosis. MV peak  gradient, 3.9 mmHg. The mean mitral valve gradient is 1.0 mmHg. Tricuspid Valve: The tricuspid valve is normal in structure. Tricuspid valve regurgitation is not demonstrated. No evidence of tricuspid stenosis. Aortic Valve: The aortic valve is tricuspid. There is mild calcification of the aortic valve. There  is mild thickening of the aortic valve. Aortic valve regurgitation is not visualized. Aortic valve sclerosis is present, with no evidence of aortic valve stenosis. Aortic valve mean gradient measures 2.0 mmHg. Aortic valve peak gradient measures 4.4 mmHg. Aortic valve area, by VTI measures 2.75 cm. Pulmonic Valve: The pulmonic valve was normal in structure. Pulmonic valve regurgitation is not visualized. No evidence of pulmonic stenosis. Aorta: The aortic root is normal in size and structure. Venous: The inferior vena cava is normal in size with greater than 50% respiratory variability, suggesting right atrial pressure of 3 mmHg. IAS/Shunts: The interatrial septum was not well visualized. Additional Comments: 3D was performed not requiring image post processing on an independent workstation and was indeterminate.  LEFT VENTRICLE PLAX 2D LVIDd:         4.70 cm LVIDs:         2.40 cm LV PW:         1.10 cm LV IVS:        1.20 cm LVOT diam:     2.00 cm LV SV:         46 LV SV Index:   18 LVOT Area:     3.14 cm  LEFT ATRIUM         Index LA diam:    3.40 cm 1.33 cm/m  AORTIC VALVE AV Area (Vmax):    2.44 cm AV Area (Vmean):   2.60 cm AV Area (VTI):     2.75 cm AV Vmax:           105.00 cm/s AV Vmean:          59.700 cm/s AV VTI:            0.169 m AV Peak Grad:      4.4 mmHg AV Mean Grad:      2.0 mmHg LVOT Vmax:         81.60 cm/s LVOT Vmean:        49.500 cm/s LVOT VTI:          0.148 m LVOT/AV VTI ratio: 0.88  AORTA Ao Root diam: 4.00 cm MITRAL VALVE MV Area (PHT): 3.95 cm    SHUNTS MV Area VTI:   2.05 cm    Systemic VTI:  0.15 m MV Peak grad:  3.9 mmHg    Systemic Diam: 2.00 cm MV Mean grad:  1.0 mmHg  MV Vmax:       0.99 m/s MV Vmean:      44.5 cm/s MV Decel Time: 192 msec MV E velocity: 89.50 cm/s Charlton Haws MD Electronically signed by Charlton Haws MD Signature Date/Time: 11/08/2023/9:59:51 AM    Final     Cardiac Studies    Patient Profile     KELON EASOM is a 78 y.o. male with a hx of chronic HFpEF, permanent atrial fibrillation (dating back to at least 2020), OSA, obesity, giant cell arteritis, aortic atherosclerosis and orthostatic hypotension who is being seen 11/06/2023 for the evaluation of orthostatic hypotension and atrial fibrillation at the request of Dr. Arbutus Leas.   Assessment & Plan    1.Orthostatic hypotension - prior stim test appropriate response without evidence of adrenal insufficiency, normal A1c - plans for outpatient considerations for amyloid given echo LVH, low voltage, neuropathy, orthostatic hypotension. Consider outpatient cMRI vs PYP scan as outpatient.   - on midodrine 20 mg tid just increased yesterday. Looking to avoid florinef given his HFpEF history, has been titrate to high doses of midodrine. May have to  consider low dose florinef trial if refractory symptoms. Can review if droxidopa may have any additive role, will discuss with pharmacy.  - Repeat orthostatics today to reassess numbers and symptoms - order thigh high stockings and abdominal binder     2.Afib - rate controlled as outpatient, history is somewhat unclear if any prior attempts to restore SR  - rate control difficult due to orthostatic hypotension. His beta blocker was stopped.  - currently on digoxin - he is on xarelto for stroke prevention - rates reasonable, with activity likely some tachycardia related to his orthostatic changes.      3. Chronic HFpEF - Jan 2022 echo: LVE 50-55%, mild LVH, low normal RV function - from notes intolerant to SGLT2i due to GI symptoms - appears euvolemic   For questions or updates, please contact [facility] Please consult [email or web address] for  contact info under        Signed, Dina Rich, MD  11/09/2023, 10:14 AM

## 2023-11-09 NOTE — TOC Transition Note (Signed)
 Transition of Care [facility]) - Discharge Note   Patient Details  Name: TIM CORRIHER MRN: [id number] Date of Birth: [birth year]  Transition of Care [facility]) CM/SW Contact:  Villa Herb, LCSWA Phone Number: 11/09/2023, 11:20 AM  Clinical Narrative:    CSW updated that PT is recommending HH PT for pt at D/C. CSW spoke with pt to complete assessment. Pt lives alone. Pt states he has an aide that assists with ADLs when needed. Pt has a friend that provides his transportation. Pt states that he has had HH in the past but not currently, he is agreeable to this being set up and has no agency preference. CSW spoke to [place] with [facility] who accepts [facility] PT referral, CSW requested [facility] PT orders. HH agency info added to AVS for pt to review at D/C. Pt has a walker and wheelchair to use when needed. TOC signing off.   Final next level of care: Home w Home Health Services Barriers to Discharge: Barriers Resolved   Patient Goals and CMS Choice Patient states their goals for this hospitalization and ongoing recovery are:: Return home CMS Medicare.gov Compare Post Acute Care list provided to:: Patient Choice offered to / list presented to : Patient      Discharge Placement                       Discharge Plan and Services Additional resources added to the After Visit Summary for                            [facility] Arranged: PT HH Agency: [facility] Date [facility] Agency Contacted: 11/09/23   Representative spoke with at [facility] Agency: Adele Dan  Social Drivers of Health (SDOH) Interventions SDOH Screenings   Food Insecurity: No Food Insecurity (11/06/2023)  Housing: Low Risk  (11/06/2023)  Transportation Needs: No Transportation Needs (11/06/2023)  Utilities: Not At Risk (11/06/2023)  Financial Resource Strain: Low Risk  (09/15/2023)   Received from [facility]  Physical Activity: Sufficiently Active (01/11/2023)   Received from [facility]  Social Connections:  Moderately Isolated (11/06/2023)  Stress: No Stress Concern Present (10/08/2023)   Received from [facility]  Tobacco Use: Medium Risk (11/05/2023)     Readmission Risk Interventions    11/09/2023   10:58 AM  Readmission Risk Prevention Plan  Transportation Screening Complete  Home Care Screening Complete  Medication Review (RN CM) Complete

## 2023-11-09 NOTE — Progress Notes (Signed)
 Pt PIV lost @ approx 0500 during bath. X2 PIV attempts made without success. ICU charge reached out to for [facility], unable to get ETA as they are currently busy with emergent situation at this time. Unable to give AM compazine d/t lack of IV access.

## 2023-11-09 NOTE — Telephone Encounter (Signed)
 Patient Product/process development scientist completed.    The patient is insured through [facility]. Patient has Medicare and is not eligible for a copay card, but may be able to apply for patient assistance or Medicare RX Payment Plan (Patient Must reach out to their plan, if eligible for payment plan), if available.    Ran test claim for droxidopa 100 mg and Product not covered   This test claim was processed through [employer]- copay amounts may vary at other pharmacies due to [employer], or as the patient moves through the different stages of their insurance plan.     Roland Earl, CPHT Pharmacy Technician III Certified Patient Advocate [facility] Patient Advocate Team Direct Number: [phone]  Fax: [phone]

## 2023-11-10 LAB — CULTURE, BLOOD (ROUTINE X 2)
Culture: NO GROWTH
Culture: NO GROWTH
Special Requests: ADEQUATE

## 2023-11-10 LAB — MULTIPLE MYELOMA PANEL, SERUM
Albumin SerPl Elph-Mcnc: 2.7 g/dL — ABNORMAL LOW (ref 2.9–4.4)
Albumin/Glob SerPl: 0.9 (ref 0.7–1.7)
Alpha 1: 0.3 g/dL (ref 0.0–0.4)
Alpha2 Glob SerPl Elph-Mcnc: 0.8 g/dL (ref 0.4–1.0)
B-Globulin SerPl Elph-Mcnc: 0.9 g/dL (ref 0.7–1.3)
Gamma Glob SerPl Elph-Mcnc: 1.1 g/dL (ref 0.4–1.8)
Globulin, Total: 3.1 g/dL (ref 2.2–3.9)
IgA: 367 mg/dL (ref 61–437)
IgG (Immunoglobin G), Serum: 1150 mg/dL (ref 603–1613)
IgM (Immunoglobulin M), Srm: 82 mg/dL (ref 15–143)
Total Protein ELP: 5.8 g/dL — ABNORMAL LOW (ref 6.0–8.5)

## 2023-11-10 LAB — PROTEIN ELECTROPHORESIS, SERUM
A/G Ratio: 0.8 (ref 0.7–1.7)
Albumin ELP: 2.7 g/dL — ABNORMAL LOW (ref 2.9–4.4)
Alpha-1-Globulin: 0.3 g/dL (ref 0.0–0.4)
Alpha-2-Globulin: 0.8 g/dL (ref 0.4–1.0)
Beta Globulin: 1.2 g/dL (ref 0.7–1.3)
Gamma Globulin: 0.9 g/dL (ref 0.4–1.8)
Globulin, Total: 3.3 g/dL (ref 2.2–3.9)
Total Protein ELP: 6 g/dL (ref 6.0–8.5)

## 2023-11-16 NOTE — Progress Notes (Unsigned)
 Cardiology Office Note    Patient Name: [name] Date of Encounter: 11/16/2023  Primary Care Provider:  Medicine, [facility] Primary Cardiologist:  None Primary Electrophysiologist: None   Past Medical History    Past Medical History:  Diagnosis Date   Arthritis    knees,feet,shoulders,elbows.hands   CHF (congestive heart failure) (HCC)    Chronic atrial fibrillation (HCC)    Constipation    Dyspnea    with exertion    Dysrhythmia    Atrial Flutter- 2006- corredted itself   Family history of adverse reaction to anesthesia    mother- with novocaine went into shock   GERD (gastroesophageal reflux disease)    Headache    Hemorrhoids    History of blood transfusion    Hypotension    Insomnia    Sleep apnea    cpap   Spondylosis of cervical region without myelopathy or radiculopathy 04/30/2017   Formatting of this note might be different from the original. 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 male with a PMH of HFpEF, AF, OSA (on CPAP), morbid obesity s/p Roux-en-Y bypass, orthostatic hypotension, GERD who presents today for posthospital follow-up.  [name] was seen initially by in 2015 for complaint of dyspnea on exertion and underwent a stress test that was low risk.  He had a 2D echo completed that showed normal EF with no significant valve abnormalities.  He was admitted 04/19/2018 with new onset AF with RVR and was treated with before meals and rate control.  He completed a 2D echo in 05/2021 that showed an EF of 35 to 40% and repeat 2D echo in 09/2023 with an EF of 50 to 55%.  He has been followed by [facility] cardiology.   He presented to [facility] with complaint of near syncope and low BP persisting for several months.  On 10/25/2023 with complaint of hypotension.  He had previously been taken off of metoprolol with improvement.  A CT of  the head that was normal and received IV fluids and midodrine with improvement.  He was started on digoxin due to hypotension along with midodrine.  He was discharged with plan to follow-up with [facility].  Kappa and lambda light chains were elevated with recommendation to follow-up with hematology and outpatient MRI.  He was readmitted on 11/05/2023 to [facility] with complaint of weakness with chills and orthostatic hypotension.  He also noted shortness of breath and chest pain with exertion.  Hypotension was refractory to midodrine persistent dizziness and presyncope.  2D echo was completed and showed EF of 55% with no RWMA and mild LVH with mildly increased RV wall thickness.  He was discharged on 11/01/2023 with advisement to hold Lasix and consider outpatient CT MRI versus PYP scan.  He was placed on midodrine 20 mg 3 times daily with possible consideration of Florinef or droxidopa in the future.  He had orthostatics repeated prior to discharge and patient was ordered thigh-high stockings and abdominal binder.  During today's visit the patient reports*** .  Patient denies chest pain, palpitations, dyspnea, PND, orthopnea, nausea, vomiting, dizziness, syncope, edema, weight gain, or early satiety.  ***Notes: Patient may need CT MRI versus PYP scan-patient may benefit from low-dose Florinef -Last ischemic evaluation: -Last echo: -Interim ED visits: Review of Systems  Please see the history of present illness.    All  other systems reviewed and are otherwise negative except as noted above.  Physical Exam    Wt Readings from Last 3 Encounters:  11/09/23 (!) 348 lb 1.7 oz (157.9 kg)  10/25/23 (!) 330 lb 0.5 oz (149.7 kg)  09/28/20 (!) 304 lb 7.3 oz (138.1 kg)   GM:WNUUV were no vitals filed for this visit.,There is no height or weight on file to calculate BMI. GEN: Well nourished, well developed in no acute distress Neck: No JVD; No carotid bruits Pulmonary: Clear to auscultation without rales,  wheezing or rhonchi  Cardiovascular: Normal rate. Regular rhythm. Normal S1. Normal S2.   Murmurs: There is no murmur.  ABDOMEN: Soft, non-tender, non-distended EXTREMITIES:  No edema; No deformity   EKG/LABS/ Recent Cardiac Studies   ECG personally reviewed by me today - ***  Risk Assessment/Calculations:   {Does this patient have ATRIAL FIBRILLATION?[phone]}      Lab Results  Component Value Date   WBC 5.2 11/07/2023   HGB 11.4 (L) 11/07/2023   HCT 37.4 (L) 11/07/2023   MCV 102.7 (H) 11/07/2023   PLT 185 11/07/2023   Lab Results  Component Value Date   CREATININE 1.08 11/07/2023   BUN 19 11/07/2023   NA 140 11/07/2023   K 4.6 11/07/2023   CL 105 11/07/2023   CO2 24 11/07/2023   No results found for: "CHOL", "HDL", "LDLCALC", "LDLDIRECT", "TRIG", "CHOLHDL"  Lab Results  Component Value Date   HGBA1C 5.6 10/31/2023   Assessment & Plan    1.  Orthostatic hypotension  2.  Permanent AF:  3.  Chronic HFpEF:  4.  History of OSA:    5.  History of obesity:  Disposition: Follow-up with None or APP in *** months {Are you ordering a CV Procedure (e.g. stress test, cath, DCCV, TEE, etc)?   Press F2        :[id number]}   Signed, Napoleon Form, Leodis Rains, NP 11/16/2023, 7:34 PM [facility]

## 2023-11-17 ENCOUNTER — Encounter: Payer: Self-pay | Admitting: Nurse Practitioner

## 2023-11-17 ENCOUNTER — Ambulatory Visit: Attending: Nurse Practitioner | Admitting: Nurse Practitioner

## 2023-11-17 VITALS — BP 104/58 | HR 80 | Ht 68.0 in

## 2023-11-17 DIAGNOSIS — I482 Chronic atrial fibrillation, unspecified: Secondary | ICD-10-CM

## 2023-11-17 DIAGNOSIS — I951 Orthostatic hypotension: Secondary | ICD-10-CM | POA: Diagnosis not present

## 2023-11-17 DIAGNOSIS — G4733 Obstructive sleep apnea (adult) (pediatric): Secondary | ICD-10-CM | POA: Diagnosis not present

## 2023-11-17 DIAGNOSIS — E669 Obesity, unspecified: Secondary | ICD-10-CM

## 2023-11-17 DIAGNOSIS — I5032 Chronic diastolic (congestive) heart failure: Secondary | ICD-10-CM

## 2023-11-17 MED ORDER — GABAPENTIN 300 MG PO CAPS: 300.0000 mg | ORAL_CAPSULE | Freq: Two times a day (BID) | ORAL | Status: DC

## 2023-11-17 MED ORDER — FLUDROCORTISONE ACETATE 0.1 MG PO TABS: 0.0500 mg | ORAL_TABLET | Freq: Every day | ORAL | 3 refills | Status: DC

## 2023-11-17 MED ORDER — MIDODRINE HCL 10 MG PO TABS: 10.0000 mg | ORAL_TABLET | Freq: Three times a day (TID) | ORAL | 1 refills | Status: DC

## 2023-11-17 NOTE — Patient Instructions (Addendum)
 Medication Instructions:  DECREASE Gabapentin to 300mg  Take 1 tablet twice a day  START Florinef 0.5mg  Take 1 tablet once a day  DECREASE 10mg  Take 1 tablet three times a day  *If you need a refill on your cardiac medications before your next appointment, please call your pharmacy*   Lab Work: TODAY-DIGOXIN LEVEL If you have labs (blood work) drawn today and your tests are completely normal, you will receive your results only by: MyChart Message (if you have MyChart) OR A paper copy in the mail If you have any lab test that is abnormal or we need to change your treatment, we will call you to review the results.   Testing/Procedures: Your physician has requested that you have a cardiac MRI. Cardiac MRI uses a computer to create images of your heart as its beating, producing both still and moving pictures of your heart and major blood vessels. For further information please visit [email or web address]. Please follow the instruction sheet given to you today for more information.   Follow-Up: At [facility], you and your health needs are our priority.  As part of our continuing mission to provide you with exceptional heart care, we have created designated Provider Care Teams.  These Care Teams include your primary Cardiologist (physician) and Advanced Practice Providers (APPs -  Physician Assistants and Nurse Practitioners) who all work together to provide you with the care you need, when you need it.  We recommend signing up for the patient portal called "MyChart".  Sign up information is provided on this After Visit Summary.  MyChart is used to connect with patients for Virtual Visits (Telemedicine).  Patients are able to view lab/test results, encounter notes, upcoming appointments, etc.  Non-urgent messages can be sent to your provider as well.   To learn more about what you can do with MyChart, go to [email or web address].    Your next appointment:   3 month(s)  Provider:    Dietrich Pates, MD   Other Instructions: The change in your medication can cause supine hypertension; check your blood pressure carefully  Check your blood pressure 3-4 times a day sitting and standing  Stay away from cold medication Drink some liquid IV (drink mix)  Watch your salt intake       You are scheduled for Cardiac MRI at the location below.  Please arrive for your appointment at ______________ . ?  [facility] [address] [place], [place] [address] Please take advantage of the free valet parking available at the [facility] and [employer] (Entrance C).  Proceed to the [facility] Radiology Department (First Floor) for check-in.    Magnetic resonance imaging (MRI) is a painless test that produces images of the inside of the body without using Xrays.  During an MRI, strong magnets and radio waves work together in a Data processing manager to form detailed images.   MRI images may provide more details about a medical condition than X-rays, CT scans, and ultrasounds can provide.  You may be given earphones to listen for instructions.  You may eat a light breakfast and take medications as ordered with the exception of furosemide, hydrochlorothiazide, chlorthalidone or spironolactone (or any other fluid pill). If you are undergoing a stress MRI, please avoid stimulants for 12 hr prior to test. (I.e. Caffeine, nicotine, chocolate, or antihistamine medications)  An IV will be inserted into one of your veins. Contrast material will be injected into your IV. It will leave your body through your urine within a  day. You may be told to drink plenty of fluids to help flush the contrast material out of your system.  You will be asked to remove all metal, including: Watch, jewelry, and other metal objects including hearing aids, hair pieces and dentures. Also wearable glucose monitoring systems (ie. Freestyle Libre and Omnipods) (Braces and fillings normally are not a  problem.)   TEST WILL TAKE APPROXIMATELY 1 HOUR  PLEASE NOTIFY SCHEDULING AT LEAST 24 HOURS IN ADVANCE IF YOU ARE UNABLE TO KEEP YOUR APPOINTMENT. [phone]  For more information and frequently asked questions, please visit our website : [email or web address]  Please call the Cardiac Imaging Nurse Navigators with any questions/concerns. [phone] Office

## 2023-11-18 ENCOUNTER — Encounter (HOSPITAL_COMMUNITY): Payer: Self-pay

## 2023-11-19 ENCOUNTER — Telehealth: Payer: Self-pay | Admitting: Internal Medicine

## 2023-11-19 NOTE — Telephone Encounter (Signed)
 Pt reports that he is "having a hard time breathing at times", he feels like he is "gasping for air".  He is asking if our office can help with this, medications, oxygen etc. Pt aware that I have only briefly looked through the office note from 2 days ago, aware that I am not sure it is cardiology related and that he may have to start with his PCP first.  Patient was not happy to hear this and said "I am not going to my primary".  Explained to pt that I am not sure, that I again am just briefly looking at his chart and will forward to NP to review/advise on.  Explained that from what I can see he has a hx of SOB, so unsure what his recommended treatment will be. He denies CP, wt gain or edema. Aware forwarding to NP for advisement and that we would call him, pt agreeable to plan.

## 2023-11-19 NOTE — Telephone Encounter (Addendum)
 Contacted the patient to give [name] recommendations. Patient states he can not get to his pcp and does not want to go to the ED. He wants to know why we can not just give him some oxygen. I advised that treat his heart and the provider recommends he follow up with his pcp to discuss oxygen. He yelled at me "stating his pcp is not available." I advised the patient to go the there emergency room if he needed immediate assistance. He voiced understanding and hung up.  [name] made aware.

## 2023-11-19 NOTE — Telephone Encounter (Signed)
 Gaston Islam., NP  You1 hour ago (12:50 PM)    Please advise patient to follow-up with his PCP regarding shortness of breath.  Please encourage him to use his as needed Xopenex and if patient's shortness of breath worsens I would advise to go to the ED for further evaluation.  Thanks, Robin Searing, NP

## 2023-11-19 NOTE — Telephone Encounter (Signed)
 Pt c/o Shortness Of Breath: STAT if SOB developed within the last 24 hours or pt is noticeably SOB on the phone  1. Are you currently SOB (can you hear that pt is SOB on the phone)? no  2. How long have you been experiencing SOB? Patient states for about 6 months, but worse in the past two weeks.   3. Are you SOB when sitting or when up moving around? Worse when moving around or not in the right position.   4. Are you currently experiencing any other symptoms? Dizziness  He wants to know if he can get something for the spasms he gets when is is SOB.  He states he forgot to mention this when he was in the office two days ago.

## 2023-12-07 NOTE — Progress Notes (Signed)
 [facility] [address], [place] [address]   Clinic Day:  12/08/2023  Referring physician: Medicine, [facility]*  Patient Care Team: Medicine, [facility] as PCP - General (Family Medicine) Jena Gauss, Gerrit Friends, MD as Consulting Physician (Gastroenterology) Pricilla Riffle, MD as Consulting Physician (Cardiology)   ASSESSMENT & PLAN:   Assessment:  1.  Elevated free light chains: - Patient seen at the request of Dr. Tenny Craw to evaluate for amyloidosis in the setting of LVH on echocardiogram, low voltage, 1 year history of neuropathy in the feet, and orthostatic hypotension. - 10/31/2023: Kappa light chains elevated at 48.7, lambda light chains 40.3 and ratio of 1.21.  On 11/07/2023, SPEP with M spike negative, immunofixation normal. - [name] has mild macrocytic anemia since January 2022.  2. Social/Family History: -Lives at home by himself. Retired from [employer] banker, corporate. Ambulates with a walker. Dependent of ADL's and IADL's. Quit tobacco use 50 years ago. Possible chemical exposure from water and sewage treatment.  -No family history of multiple myeloma or amyloid. Mother had lung cancer and was a smoker. No other family history of cancer.   Plan:  1.  Elevated free light chains: - We discussed the reason for his visit in detail.  [name] is scheduled for cardiac MRI on 12/21/2023. - I have recommended further workup to evaluate for amyloidosis with 24-hour urine for total protein, urine immunofixation, UPEP, U FLC. - Also recommend bone marrow biopsy with amyloid staining.  Will also send cytogenetics. - [name] would like to follow-up as a phone visit as it takes a lot of effort for him to get out of home.  We will do a phone visit 2 weeks after the biopsy.  2.  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  [name] had history of gastric bypass surgery several decades ago. - Will check for nutritional  deficiencies including ferritin, iron panel, B12, folic acid, MMA and copper levels.  Will also send NGS myeloid panel.   Orders Placed This Encounter  Procedures   CT BONE MARROW BIOPSY & ASPIRATION    Cytogenetics; amyloid stains    Standing Status:   Future    Expected Date:   12/15/2023    Expiration Date:   12/07/2024    Reason for Exam (SYMPTOM  OR DIAGNOSIS REQUIRED):   elevated light chains; r/o amyloidosis    Preferred location?:   [facility]   CBC with Differential    Standing Status:   Future    Number of Occurrences:   1    Expected Date:   12/08/2023    Expiration Date:   12/07/2024   Comprehensive metabolic panel    Standing Status:   Future    Number of Occurrences:   1    Expected Date:   12/08/2023    Expiration Date:   12/07/2024   Lactate dehydrogenase    Standing Status:   Future    Number of Occurrences:   1    Expected Date:   12/08/2023    Expiration Date:   12/07/2024   Kappa/lambda light chains    Standing Status:   Future    Number of Occurrences:   1    Expected Date:   12/08/2023    Expiration Date:   12/07/2024   Iron and TIBC ([facility] ONLY)    Standing Status:   Future    Number of Occurrences:  1    Expected Date:   12/08/2023    Expiration Date:   12/07/2024   Ferritin    Standing Status:   Future    Number of Occurrences:   1    Expected Date:   12/08/2023    Expiration Date:   12/07/2024   Vitamin B12    Standing Status:   Future    Number of Occurrences:   1    Expected Date:   12/08/2023    Expiration Date:   12/07/2024   Folate    Standing Status:   Future    Number of Occurrences:   1    Expected Date:   12/08/2023    Expiration Date:   12/07/2024   TSH    Standing Status:   Future    Number of Occurrences:   1    Expected Date:   12/08/2023    Expiration Date:   12/07/2024   IntelliGEN Myeloid    Standing Status:   Future    Number of Occurrences:   1    Expected Date:   12/08/2023    Expiration Date:   12/07/2024   24 hr Ur  UPEP/UIFE/Light Chains/TP    Standing Status:   Future    Expected Date:   12/08/2023    Expiration Date:   12/07/2024   Copper, serum    Standing Status:   Future    Number of Occurrences:   1    Expected Date:   12/08/2023    Expiration Date:   12/07/2024   Methylmalonic acid, serum    Standing Status:   Future    Number of Occurrences:   1    Expected Date:   12/08/2023    Expiration Date:   12/07/2024      Mikeal Hawthorne R Teague,acting as a scribe for Doreatha Massed, MD.,have documented all relevant documentation on the behalf of Doreatha Massed, MD,as directed by  Doreatha Massed, MD while in the presence of Doreatha Massed, MD.   I, Doreatha Massed MD, have reviewed the above documentation for accuracy and completeness, and I agree with the above.   Doreatha Massed, MD   4/8/20252:23 PM  CHIEF COMPLAINT/PURPOSE OF CONSULT:   Diagnosis: Elevated free light chains and macrocytic anemia  Current Therapy: Under workup  HISTORY OF PRESENT ILLNESS:   [name] is a 78 y.o. male presenting to clinic today for evaluation of amyloidosis at the request of Pricilla Riffle, MD.  Patient has a medical history of orthostatic hypertension, temporal giant cell arteritis, a-fib, aortic atherosclerosis, diastolic CHF, OSA on CPAP, GERD, and liver cirrhosis. His last CBC diff from 11/25/23 was abnormal with low RBC at 3.42, low HGB at 10.8, low HCT at 34.5, elevated MCV at 100.9, and low MCHC at 31.3.   [name] was hospitalized from 11/06/23 to 11/09/23 for low blood pressure and near syncope symptoms, treated with midodrine. [name] also had chronic HFpEF and was recommended for outpatient consideration for amyloid given echo LVH, neuropathy, and orthostatic hypotension. [name] has a MRI of the heart scheduled for 12/21/23.   Today, [name] states that [name] is doing well overall. His appetite level is at 0%. His energy level is at 0%.  [name] is accompanied by his family.   [name] reports occasional tingling and  numbness in the feet for the past year. [name] denies any DM. [name] is s/p gastric bypass surgery 17 years ago.   PAST MEDICAL HISTORY:   Past Medical History: Past  Medical History:  Diagnosis Date   Arthritis    knees,feet,shoulders,elbows.hands   CHF (congestive heart failure) (HCC)    Chronic atrial fibrillation (HCC)    Constipation    Dyspnea    with exertion    Dysrhythmia    Atrial Flutter- 2006- corredted itself   Family history of adverse reaction to anesthesia    mother- with novocaine went into shock   GERD (gastroesophageal reflux disease)    Headache    Hemorrhoids    History of blood transfusion    Hypotension    Insomnia    Sleep apnea    cpap   Spondylosis of cervical region without myelopathy or radiculopathy 04/30/2017   Formatting of this note might be different from the original. Added automatically from request for surgery [id number]   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    Surgical History: Past Surgical History:  Procedure Laterality Date   APPENDECTOMY     ARTERY BIOPSY Left 12/30/2017   Procedure: BIOPSY TEMPORAL ARTERY;  Surgeon: Claud Kelp, MD;  Location: [facility];  Service: General;  Laterality: Left;   CHOLECYSTECTOMY     COLONOSCOPY W/ POLYPECTOMY     ESOPHAGOGASTRODUODENOSCOPY (EGD) WITH PROPOFOL  07/06/2012   Procedure: ESOPHAGOGASTRODUODENOSCOPY (EGD) WITH PROPOFOL;  Surgeon: Petra Kuba, MD;  Location: [facility];  Service: Endoscopy;  Laterality: N/A;   ESOPHAGOSCOPY     EYE SURGERY     left eye- muscle repair   HERNIA REPAIR     ventral hernia   INCISION AND DRAINAGE HIP Left 03/17/2018   Procedure: IRRIGATION AND DEBRIDEMENT LEFT THIGH WOUND;  Surgeon: Ollen Gross, MD;  Location: [facility];  Service: Orthopedics;  Laterality: Left;   JOINT REPLACEMENT     bilateral hips.  Right broke and had to be re placed again   MASS EXCISION N/A 03/02/2015   Procedure: EXCISION ABDOMINAL WALL MASS;  Surgeon: Ovidio Kin, MD;  Location: [facility]  OR;  Service: General;  Laterality: N/A;   TOTAL HIP REVISION Left 02/17/2018   Procedure: Left total hip arthroplasty revision;  Surgeon: Ollen Gross, MD;  Location: [facility];  Service: Orthopedics;  Laterality: Left;   VERTICAL BANDED GASTROPLASTY      Social History: Social History   Socioeconomic History   Marital status: Divorced    Spouse name: Not on file   Number of children: Not on file   Years of education: Not on file   Highest education level: Not on file  Occupational History   Not on file  Tobacco Use   Smoking status: Former    Current packs/day: 0.00    Average packs/day: 2.0 packs/day for 17.8 years (35.6 ttl pk-yrs)    Types: Cigarettes    Start date: 05/16/1966    Quit date: 03/11/1984    Years since quitting: 39.7   Smokeless tobacco: Never  Vaping Use   Vaping status: Never Used  Substance and Sexual Activity   Alcohol use: Not Currently    Comment: occasionally a couple of times a year    Drug use: No   Sexual activity: Not on file  Other Topics Concern   Not on file  Social History Narrative   Not on file   Social Drivers of Health   Financial Resource Strain: Low Risk  (09/15/2023)   [facility]   Overall Financial Resource Strain (CARDIA)    Difficulty of Paying Living Expenses: Not hard at all  Food Insecurity: No Food Insecurity (11/06/2023)  Hunger Vital Sign    Worried About Running Out of Food in the Last Year: Never true    Ran Out of Food in the Last Year: Never true  Transportation Needs: No Transportation Needs (11/06/2023)   PRAPARE - Administrator, Civil Service (Medical): No    Lack of Transportation (Non-Medical): No  Physical Activity: Sufficiently Active (01/11/2023)   Received from [facility]   Exercise Vital Sign    Days of Exercise per Week: 7 days    Minutes of Exercise per Session: 60 min  Stress: No Stress Concern Present (10/08/2023)   [facility] from [facility]  Occupational [facility] - Occupational Stress Questionnaire    Feeling of Stress : Not at all  Social Connections: Moderately Isolated (11/06/2023)   Social Connection and Isolation Panel [NHANES]    Frequency of Communication with Friends and Family: More than three times a week    Frequency of Social Gatherings with Friends and Family: More than three times a week    Attends Religious Services: 1 to 4 times per year    Active Member of [employer] or Organizations: No    Attends Banker Meetings: Never    Marital Status: Divorced  Catering manager Violence: Not At Risk (11/06/2023)   Humiliation, Afraid, Rape, and Kick questionnaire    Fear of Current or Ex-Partner: No    Emotionally Abused: No    Physically Abused: No    Sexually Abused: No    Family History: Family History  Problem Relation Age of Onset   Cancer Mother    Alcohol abuse Father     Current Medications:  Current Outpatient Medications:    alum & mag hydroxide-simeth (MAALOX/MYLANTA) 200-200-20 MG/5ML suspension, Take 15 mLs by mouth every 6 (six) hours as needed for indigestion or heartburn., Disp: , Rfl:    digoxin (LANOXIN) 0.25 MG tablet, Take 1 tablet (0.25 mg total) by mouth daily., Disp: 30 tablet, Rfl: 1   DM-Doxylamine-Acetaminophen (NYQUIL COLD & FLU PO), Take 15 mLs by mouth daily as needed (cold/cough)., Disp: , Rfl:    Ensure Max Protein (ENSURE MAX PROTEIN) LIQD, Take 330 mLs (11 oz total) by mouth 2 (two) times daily., Disp: , Rfl:    escitalopram (LEXAPRO) 10 MG tablet, Take 1 tablet (10 mg total) by mouth daily., Disp: 30 tablet, Rfl: 0   fludrocortisone (FLORINEF) 0.1 MG tablet, Take 0.5 tablets (0.05 mg total) by mouth daily., Disp: 30 tablet, Rfl: 3   furosemide (LASIX) 80 MG tablet, Take by mouth., Disp: , Rfl:    gabapentin (NEURONTIN) 300 MG capsule, Take 1 capsule (300 mg total) by mouth 2 (two) times daily., Disp: , Rfl:    HYDROcodone-acetaminophen (NORCO) 10-325 MG tablet, Take by mouth.,  Disp: , Rfl:    ibuprofen (ADVIL) 200 MG tablet, Take 400 mg by mouth every 6 (six) hours as needed for mild pain (pain score 1-3)., Disp: , Rfl:    levalbuterol (XOPENEX HFA) 45 MCG/ACT inhaler, Inhale 2 puffs into the lungs every 6 (six) hours as needed for wheezing or shortness of breath., Disp: 1 each, Rfl: 2   Melatonin 3 MG TABS, Take 2 tablets (6 mg total) by mouth at bedtime., Disp: 30 tablet, Rfl: 0   midodrine (PROAMATINE) 10 MG tablet, Take 1 tablet (10 mg total) by mouth 3 (three) times daily with meals., Disp: 90 tablet, Rfl: 1   Multiple Vitamins-Minerals (CENTRUM ADULTS PO), Take 1 tablet by mouth  every evening. , Disp: , Rfl:    pantoprazole (PROTONIX) 40 MG tablet, Take 1 tablet (40 mg total) by mouth daily., Disp: 30 tablet, Rfl: 0   promethazine (PHENERGAN) 25 MG tablet, Take 25 mg by mouth every 6 (six) hours as needed for nausea or vomiting., Disp: , Rfl:    traZODone (DESYREL) 50 MG tablet, Take 1 tablet (50 mg total) by mouth at bedtime as needed for sleep., Disp: , Rfl:    XARELTO 20 MG TABS tablet, Take 20 mg by mouth every evening., Disp: , Rfl:    Allergies: Allergies  Allergen Reactions   Bee Venom Anaphylaxis and Other (See Comments)    Noted from [place]. honey bee venom   Nickel Rash and Other (See Comments)    "Breaks me out"    REVIEW OF SYSTEMS:   Review of Systems  Constitutional:  Negative for chills, fatigue and fever.  HENT:   Positive for trouble swallowing (occasional). Negative for lump/mass, mouth sores, nosebleeds and sore throat.   Eyes:  Negative for eye problems.  Respiratory:  Positive for cough and shortness of breath.   Cardiovascular:  Negative for chest pain, leg swelling and palpitations.  Gastrointestinal:  Positive for diarrhea and nausea. Negative for abdominal pain, constipation and vomiting.  Genitourinary:  Positive for frequency. Negative for bladder incontinence, difficulty urinating, dysuria, hematuria and nocturia.    Musculoskeletal:  Negative for arthralgias, back pain, flank pain, myalgias and neck pain.       +shoulder pain, 6/10 severity  Skin:  Negative for itching and rash.  Neurological:  Positive for dizziness. Negative for headaches and numbness.       +occasional tingling in feet  Hematological:  Does not bruise/bleed easily.  Psychiatric/Behavioral:  Positive for depression and sleep disturbance. Negative for suicidal ideas. The patient is not nervous/anxious.   All other systems reviewed and are negative.    VITALS:   Blood pressure (!) 109/59, pulse 76, temperature (!) 96.6 F (35.9 C), temperature source Tympanic, resp. rate 20, SpO2 97%.  Wt Readings from Last 3 Encounters:  11/09/23 (!) 348 lb 1.7 oz (157.9 kg)  10/25/23 (!) 330 lb 0.5 oz (149.7 kg)  09/28/20 (!) 304 lb 7.3 oz (138.1 kg)    There is no height or weight on file to calculate BMI.   PHYSICAL EXAM:   Physical Exam Vitals and nursing note reviewed. Exam conducted with a chaperone present.  Constitutional:      Appearance: Normal appearance.  Cardiovascular:     Rate and Rhythm: Normal rate and regular rhythm.     Pulses: Normal pulses.     Heart sounds: Normal heart sounds.  Pulmonary:     Effort: Pulmonary effort is normal.     Breath sounds: Normal breath sounds.  Abdominal:     Palpations: Abdomen is soft. There is no hepatomegaly, splenomegaly or mass.     Tenderness: There is no abdominal tenderness.  Musculoskeletal:     Right lower leg: No edema.     Left lower leg: No edema.  Lymphadenopathy:     Cervical: No cervical adenopathy.     Right cervical: No superficial, deep or posterior cervical adenopathy.    Left cervical: No superficial, deep or posterior cervical adenopathy.     Upper Body:     Right upper body: No supraclavicular or axillary adenopathy.     Left upper body: No supraclavicular or axillary adenopathy.  Neurological:     General: No focal deficit  present.     Mental Status: [name]  is alert and oriented to person, place, and time.  Psychiatric:        Mood and Affect: Mood normal.        Behavior: Behavior normal.     LABS:   CBC    Component Value Date/Time   WBC 5.2 11/07/2023 0355   RBC 3.64 (L) 11/07/2023 0355   HGB 11.4 (L) 11/07/2023 0355   HCT 37.4 (L) 11/07/2023 0355   PLT 185 11/07/2023 0355   MCV 102.7 (H) 11/07/2023 0355   MCH 31.3 11/07/2023 0355   MCHC 30.5 11/07/2023 0355   RDW 14.6 11/07/2023 0355   LYMPHSABS 0.7 11/05/2023 2047   MONOABS 0.9 11/05/2023 2047   EOSABS 0.0 11/05/2023 2047   BASOSABS 0.0 11/05/2023 2047    CMP    Component Value Date/Time   NA 140 11/07/2023 0355   K 4.6 11/07/2023 0355   CL 105 11/07/2023 0355   CO2 24 11/07/2023 0355   GLUCOSE 95 11/07/2023 0355   BUN 19 11/07/2023 0355   CREATININE 1.08 11/07/2023 0355   CALCIUM 9.1 11/07/2023 0355   PROT 6.4 (L) 11/07/2023 0355   ALBUMIN 2.8 (L) 11/07/2023 0355   AST 20 11/07/2023 0355   ALT 14 11/07/2023 0355   ALKPHOS 72 11/07/2023 0355   BILITOT 0.5 11/07/2023 0355   GFRNONAA >60 11/07/2023 0355   GFRAA 47 (L) 07/12/2019 1906    No results found for: "CEA1", "CEA" / No results found for: "CEA1", "CEA" No results found for: "PSA1" No results found for: "CAN199" No results found for: "CAN125"  Lab Results  Component Value Date   TOTALPROTELP 5.8 (L) 11/07/2023   ALBUMINELP 2.7 (L) 10/31/2023   A1GS 0.3 10/31/2023   A2GS 0.8 10/31/2023   BETS 1.2 10/31/2023   GAMS 0.9 10/31/2023   MSPIKE Not Observed 10/31/2023   SPEI Comment 10/31/2023   Lab Results  Component Value Date   TIBC 180 (L) 10/27/2023   TIBC 252 08/26/2018   FERRITIN 487 (H) 10/27/2023   FERRITIN 428 (H) 09/28/2020   FERRITIN 441 (H) 09/27/2020   IRONPCTSAT 33 10/27/2023   IRONPCTSAT 12 (L) 08/26/2018   Lab Results  Component Value Date   LDH 123 09/25/2020   LDH 139 09/04/2018   LDH 132 08/26/2018     STUDIES:   No results found.

## 2023-12-08 ENCOUNTER — Inpatient Hospital Stay

## 2023-12-08 ENCOUNTER — Inpatient Hospital Stay: Attending: Hematology | Admitting: Hematology

## 2023-12-08 ENCOUNTER — Other Ambulatory Visit: Payer: Self-pay

## 2023-12-08 VITALS — BP 109/59 | HR 76 | Temp 96.6°F | Resp 20

## 2023-12-08 DIAGNOSIS — Z7901 Long term (current) use of anticoagulants: Secondary | ICD-10-CM | POA: Diagnosis not present

## 2023-12-08 DIAGNOSIS — Z809 Family history of malignant neoplasm, unspecified: Secondary | ICD-10-CM

## 2023-12-08 DIAGNOSIS — I5032 Chronic diastolic (congestive) heart failure: Secondary | ICD-10-CM | POA: Diagnosis not present

## 2023-12-08 DIAGNOSIS — K219 Gastro-esophageal reflux disease without esophagitis: Secondary | ICD-10-CM

## 2023-12-08 DIAGNOSIS — R768 Other specified abnormal immunological findings in serum: Secondary | ICD-10-CM

## 2023-12-08 DIAGNOSIS — Z79899 Other long term (current) drug therapy: Secondary | ICD-10-CM | POA: Insufficient documentation

## 2023-12-08 DIAGNOSIS — M316 Other giant cell arteritis: Secondary | ICD-10-CM | POA: Diagnosis not present

## 2023-12-08 DIAGNOSIS — R42 Dizziness and giddiness: Secondary | ICD-10-CM

## 2023-12-08 DIAGNOSIS — Z811 Family history of alcohol abuse and dependence: Secondary | ICD-10-CM

## 2023-12-08 DIAGNOSIS — R197 Diarrhea, unspecified: Secondary | ICD-10-CM

## 2023-12-08 DIAGNOSIS — R110 Nausea: Secondary | ICD-10-CM

## 2023-12-08 DIAGNOSIS — R059 Cough, unspecified: Secondary | ICD-10-CM

## 2023-12-08 DIAGNOSIS — I482 Chronic atrial fibrillation, unspecified: Secondary | ICD-10-CM | POA: Diagnosis not present

## 2023-12-08 DIAGNOSIS — I110 Hypertensive heart disease with heart failure: Secondary | ICD-10-CM | POA: Diagnosis not present

## 2023-12-08 DIAGNOSIS — Z87891 Personal history of nicotine dependence: Secondary | ICD-10-CM

## 2023-12-08 DIAGNOSIS — R1310 Dysphagia, unspecified: Secondary | ICD-10-CM

## 2023-12-08 DIAGNOSIS — F32A Depression, unspecified: Secondary | ICD-10-CM

## 2023-12-08 DIAGNOSIS — D649 Anemia, unspecified: Secondary | ICD-10-CM

## 2023-12-08 DIAGNOSIS — K7460 Unspecified cirrhosis of liver: Secondary | ICD-10-CM

## 2023-12-08 DIAGNOSIS — Z9884 Bariatric surgery status: Secondary | ICD-10-CM | POA: Diagnosis not present

## 2023-12-08 DIAGNOSIS — E8581 Light chain (AL) amyloidosis: Secondary | ICD-10-CM | POA: Diagnosis present

## 2023-12-08 DIAGNOSIS — D539 Nutritional anemia, unspecified: Secondary | ICD-10-CM | POA: Insufficient documentation

## 2023-12-08 DIAGNOSIS — G479 Sleep disorder, unspecified: Secondary | ICD-10-CM | POA: Diagnosis not present

## 2023-12-08 DIAGNOSIS — I700 Atherosclerosis of aorta: Secondary | ICD-10-CM | POA: Diagnosis not present

## 2023-12-08 DIAGNOSIS — R0602 Shortness of breath: Secondary | ICD-10-CM | POA: Diagnosis not present

## 2023-12-08 DIAGNOSIS — Z9049 Acquired absence of other specified parts of digestive tract: Secondary | ICD-10-CM | POA: Diagnosis not present

## 2023-12-08 DIAGNOSIS — Z8719 Personal history of other diseases of the digestive system: Secondary | ICD-10-CM | POA: Insufficient documentation

## 2023-12-08 DIAGNOSIS — G4733 Obstructive sleep apnea (adult) (pediatric): Secondary | ICD-10-CM | POA: Diagnosis not present

## 2023-12-08 DIAGNOSIS — I509 Heart failure, unspecified: Secondary | ICD-10-CM

## 2023-12-08 DIAGNOSIS — R350 Frequency of micturition: Secondary | ICD-10-CM

## 2023-12-08 DIAGNOSIS — Z801 Family history of malignant neoplasm of trachea, bronchus and lung: Secondary | ICD-10-CM | POA: Insufficient documentation

## 2023-12-08 LAB — CBC WITH DIFFERENTIAL/PLATELET
Abs Immature Granulocytes: 0.06 10*3/uL (ref 0.00–0.07)
Basophils Absolute: 0 10*3/uL (ref 0.0–0.1)
Basophils Relative: 0 %
Eosinophils Absolute: 0.1 10*3/uL (ref 0.0–0.5)
Eosinophils Relative: 1 %
HCT: 42 % (ref 39.0–52.0)
Hemoglobin: 13.2 g/dL (ref 13.0–17.0)
Immature Granulocytes: 1 %
Lymphocytes Relative: 7 %
Lymphs Abs: 0.5 10*3/uL — ABNORMAL LOW (ref 0.7–4.0)
MCH: 31.6 pg (ref 26.0–34.0)
MCHC: 31.4 g/dL (ref 30.0–36.0)
MCV: 100.5 fL — ABNORMAL HIGH (ref 80.0–100.0)
Monocytes Absolute: 0.7 10*3/uL (ref 0.1–1.0)
Monocytes Relative: 9 %
Neutro Abs: 6.3 10*3/uL (ref 1.7–7.7)
Neutrophils Relative %: 82 %
Platelets: 243 10*3/uL (ref 150–400)
RBC: 4.18 MIL/uL — ABNORMAL LOW (ref 4.22–5.81)
RDW: 14.5 % (ref 11.5–15.5)
WBC: 7.7 10*3/uL (ref 4.0–10.5)
nRBC: 0 % (ref 0.0–0.2)

## 2023-12-08 LAB — COMPREHENSIVE METABOLIC PANEL WITH GFR
ALT: 23 U/L (ref 0–44)
AST: 34 U/L (ref 15–41)
Albumin: 3.4 g/dL — ABNORMAL LOW (ref 3.5–5.0)
Alkaline Phosphatase: 110 U/L (ref 38–126)
Anion gap: 13 (ref 5–15)
BUN: 26 mg/dL — ABNORMAL HIGH (ref 8–23)
CO2: 28 mmol/L (ref 22–32)
Calcium: 9.5 mg/dL (ref 8.9–10.3)
Chloride: 94 mmol/L — ABNORMAL LOW (ref 98–111)
Creatinine, Ser: 1.3 mg/dL — ABNORMAL HIGH (ref 0.61–1.24)
GFR, Estimated: 57 mL/min — ABNORMAL LOW (ref >60)
Glucose, Bld: 132 mg/dL — ABNORMAL HIGH (ref 70–99)
Potassium: 4.3 mmol/L (ref 3.5–5.1)
Sodium: 135 mmol/L (ref 135–145)
Total Bilirubin: 0.7 mg/dL (ref 0.0–1.2)
Total Protein: 7.9 g/dL (ref 6.5–8.1)

## 2023-12-08 LAB — FOLATE: Folate: 25.4 ng/mL (ref >5.9)

## 2023-12-08 LAB — LACTATE DEHYDROGENASE: LDH: 132 U/L (ref 98–192)

## 2023-12-08 LAB — VITAMIN B12: Vitamin B-12: 723 pg/mL (ref 180–914)

## 2023-12-08 LAB — IRON AND TIBC
Iron: 75 ug/dL (ref 45–182)
Saturation Ratios: 29 % (ref 17.9–39.5)
TIBC: 263 ug/dL (ref 250–450)
UIBC: 188 ug/dL

## 2023-12-08 LAB — TSH: TSH: 1.085 u[IU]/mL (ref 0.350–4.500)

## 2023-12-08 LAB — FERRITIN: Ferritin: 715 ng/mL — ABNORMAL HIGH (ref 24–336)

## 2023-12-08 NOTE — Patient Instructions (Signed)
 You were seen and examined today by Dr. Ellin Saba. Dr. Ellin Saba is a hematologist, meaning that he specializes in blood abnormalities. Dr. Ellin Saba discussed your past medical history, family history of cancers/blood conditions and the events that led to you being here today.  You were referred to Dr. Ellin Saba due to to rule out a condition called amyloidosis.  Dr. Ellin Saba has recommended additional labs today for further evaluation.  Follow-up as scheduled.

## 2023-12-09 LAB — KAPPA/LAMBDA LIGHT CHAINS
Kappa free light chain: 67.1 mg/L — ABNORMAL HIGH (ref 3.3–19.4)
Kappa, lambda light chain ratio: 1.37 (ref 0.26–1.65)
Lambda free light chains: 48.9 mg/L — ABNORMAL HIGH (ref 5.7–26.3)

## 2023-12-10 DIAGNOSIS — E8581 Light chain (AL) amyloidosis: Secondary | ICD-10-CM | POA: Diagnosis not present

## 2023-12-10 LAB — METHYLMALONIC ACID, SERUM: Methylmalonic Acid, Quantitative: 430 nmol/L — ABNORMAL HIGH (ref 0–378)

## 2023-12-11 LAB — COPPER, SERUM: Copper: 144 ug/dL — ABNORMAL HIGH (ref 69–132)

## 2023-12-15 LAB — UPEP/UIFE/LIGHT CHAINS/TP, 24-HR UR
% BETA, Urine: 0 %
ALPHA 1 URINE: 0 %
Albumin, U: 100 %
Alpha 2, Urine: 0 %
Free Kappa Lt Chains,Ur: 11.86 mg/L (ref 1.17–86.46)
Free Kappa/Lambda Ratio: 5.39 (ref 1.83–14.26)
Free Lambda Lt Chains,Ur: 2.2 mg/L (ref 0.27–15.21)
GAMMA GLOBULIN URINE: 0 %
Total Protein, Urine-Ur/day: 126 mg/(24.h) (ref 30–150)
Total Protein, Urine: 6.3 mg/dL
Total Volume: 2000

## 2023-12-18 ENCOUNTER — Encounter (HOSPITAL_COMMUNITY): Payer: Self-pay

## 2023-12-18 ENCOUNTER — Other Ambulatory Visit: Payer: Self-pay | Admitting: Nurse Practitioner

## 2023-12-20 NOTE — Progress Notes (Deleted)
 Cardiology Office Note    Patient Name: [name] Date of Encounter: 12/20/2023  Primary Care Provider:  Medicine, [facility] Primary Cardiologist:  None Primary Electrophysiologist: None   Past Medical History    Past Medical History:  Diagnosis Date   Arthritis    knees,feet,shoulders,elbows.hands   CHF (congestive heart failure) (HCC)    Chronic atrial fibrillation (HCC)    Constipation    Dyspnea    with exertion    Dysrhythmia    Atrial Flutter- 2006- corredted itself   Family history of adverse reaction to anesthesia    mother- with novocaine went into shock   GERD (gastroesophageal reflux disease)    Headache    Hemorrhoids    History of blood transfusion    Hypotension    Insomnia    Sleep apnea    cpap   Spondylosis of cervical region without myelopathy or radiculopathy 04/30/2017   Formatting of this note might be different from the original. Added automatically from request for surgery [id number]   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    History of Present Illness  [name] is a 78 y.o. male with a PMH of HFpEF, AF, OSA (on CPAP), morbid obesity s/p Roux-en-Y bypass, giant cell arteritis, aortic atherosclerosis, orthostatic hypotension, GERD who presents today for posthospital follow-up.   [name] was last seen on 11/17/2023 following admission for weakness and orthostatic hypotension.  He also noted shortness of breath and chest pain with exertion with hypotension refractory to midodrine .  He had a 2D echo that showed increased RV wall thickness with Lasix  held and plan for further evaluation with CT MRI.  He was also advised to use abdominal binder and thigh-high stockings.  During follow-up patient reported worsening instability as well as persistent nausea after eating.  He was noted to have stable blood pressure of 104/58.  Cardiac MRI was ordered due to elevated kappa and lambda chains and midodrine  was reduced to 10 mg 3  times daily and fludrocortisone  was started at 0.05 mg daily.  He was also provided Xopenex  as needed for complaint of shortness of breath.  Digoxin  level was ordered however not completed and patient contacted the office 10 days later with complaint of gasping of air.  He was advised to go to the ED for further evaluation and also use Xopenex  for shortness of breath if worsening.  Patient denies chest pain, palpitations, dyspnea, PND, orthopnea, nausea, vomiting, dizziness, syncope, edema, weight gain, or early satiety.  Discussed the use of AI scribe software for clinical note transcription with the patient, who gave verbal consent to proceed.  History of Present Illness    ***Notes: -Last ischemic evaluation:  Review of Systems  Please see the history of present illness.    All other systems reviewed and are otherwise negative except as noted above.  Physical Exam    Wt Readings from Last 3 Encounters:  11/09/23 (!) 348 lb 1.7 oz (157.9 kg)  10/25/23 (!) 330 lb 0.5 oz (149.7 kg)  09/28/20 (!) 304 lb 7.3 oz (138.1 kg)   XL:KGMWN were no vitals filed for this visit.,There is no height or weight on file to calculate BMI. GEN: Well nourished, well developed in no acute distress Neck: No JVD; No carotid bruits Pulmonary: Clear to auscultation without rales, wheezing or rhonchi  Cardiovascular: Normal rate. Regular rhythm. Normal S1. Normal S2.   Murmurs: There is no murmur.  ABDOMEN: Soft, non-tender, non-distended EXTREMITIES:  No edema;  No deformity   EKG/LABS/ Recent Cardiac Studies   ECG personally reviewed by me today - ***  Risk Assessment/Calculations:   {Does this patient have ATRIAL FIBRILLATION?:[phone]}      Lab Results  Component Value Date   WBC 7.7 12/08/2023   HGB 13.2 12/08/2023   HCT 42.0 12/08/2023   MCV 100.5 (H) 12/08/2023   PLT 243 12/08/2023   Lab Results  Component Value Date   CREATININE 1.30 (H) 12/08/2023   BUN 26 (H) 12/08/2023   NA 135  12/08/2023   K 4.3 12/08/2023   CL 94 (L) 12/08/2023   CO2 28 12/08/2023   No results found for: "CHOL", "HDL", "LDLCALC", "LDLDIRECT", "TRIG", "CHOLHDL"  Lab Results  Component Value Date   HGBA1C 5.6 10/31/2023   Assessment & Plan    1.Permanent AF: -Patient intolerant to beta-blockers due to orthostatic changes currently on rate control with digoxin  0.25 mg  2.Chronic HFpEF: -Patient's EF is 55% with no RWMA and mild LVH -Today patient is   3.Orthostatic hypotension:   4.  Elevated serum immunoglobulin light chains:      Disposition: Follow-up with None or APP in *** months {Are you ordering a CV Procedure (e.g. stress test, cath, DCCV, TEE, etc)?   Press F2        :[id number]}   Signed, Francene Ing, Retha Cast, NP 12/20/2023, 1:40 PM [facility]

## 2023-12-21 ENCOUNTER — Other Ambulatory Visit: Payer: Self-pay

## 2023-12-21 ENCOUNTER — Inpatient Hospital Stay (HOSPITAL_COMMUNITY)
Admission: EM | Admit: 2023-12-21 | Discharge: 2023-12-26 | DRG: 312 | Disposition: A | Discharge status: Skilled Nursing Facility | Attending: Internal Medicine | Admitting: Internal Medicine

## 2023-12-21 ENCOUNTER — Emergency Department (HOSPITAL_COMMUNITY)

## 2023-12-21 ENCOUNTER — Ambulatory Visit (HOSPITAL_COMMUNITY)
Admission: RE | Admit: 2023-12-21 | Discharge: 2023-12-21 | Disposition: A | Discharge status: Home / Self Care | Source: Ambulatory Visit | Attending: Nurse Practitioner | Admitting: Nurse Practitioner

## 2023-12-21 ENCOUNTER — Encounter (HOSPITAL_COMMUNITY): Payer: Self-pay

## 2023-12-21 ENCOUNTER — Other Ambulatory Visit: Payer: Self-pay | Admitting: Nurse Practitioner

## 2023-12-21 ENCOUNTER — Ambulatory Visit: Admitting: Nurse Practitioner

## 2023-12-21 DIAGNOSIS — Z96643 Presence of artificial hip joint, bilateral: Secondary | ICD-10-CM | POA: Diagnosis present

## 2023-12-21 DIAGNOSIS — I951 Orthostatic hypotension: Secondary | ICD-10-CM | POA: Insufficient documentation

## 2023-12-21 DIAGNOSIS — I5033 Acute on chronic diastolic (congestive) heart failure: Secondary | ICD-10-CM | POA: Diagnosis present

## 2023-12-21 DIAGNOSIS — R768 Other specified abnormal immunological findings in serum: Secondary | ICD-10-CM

## 2023-12-21 DIAGNOSIS — I480 Paroxysmal atrial fibrillation: Secondary | ICD-10-CM | POA: Diagnosis present

## 2023-12-21 DIAGNOSIS — M170 Bilateral primary osteoarthritis of knee: Secondary | ICD-10-CM | POA: Diagnosis present

## 2023-12-21 DIAGNOSIS — G4733 Obstructive sleep apnea (adult) (pediatric): Secondary | ICD-10-CM

## 2023-12-21 DIAGNOSIS — N179 Acute kidney failure, unspecified: Secondary | ICD-10-CM | POA: Diagnosis present

## 2023-12-21 DIAGNOSIS — Z91048 Other nonmedicinal substance allergy status: Secondary | ICD-10-CM

## 2023-12-21 DIAGNOSIS — E669 Obesity, unspecified: Secondary | ICD-10-CM

## 2023-12-21 DIAGNOSIS — R5381 Other malaise: Secondary | ICD-10-CM | POA: Diagnosis present

## 2023-12-21 DIAGNOSIS — J9611 Chronic respiratory failure with hypoxia: Secondary | ICD-10-CM | POA: Diagnosis not present

## 2023-12-21 DIAGNOSIS — Z79899 Other long term (current) drug therapy: Secondary | ICD-10-CM

## 2023-12-21 DIAGNOSIS — Z7952 Long term (current) use of systemic steroids: Secondary | ICD-10-CM

## 2023-12-21 DIAGNOSIS — E66813 Obesity, class 3: Secondary | ICD-10-CM | POA: Diagnosis present

## 2023-12-21 DIAGNOSIS — M549 Dorsalgia, unspecified: Secondary | ICD-10-CM | POA: Diagnosis present

## 2023-12-21 DIAGNOSIS — I482 Chronic atrial fibrillation, unspecified: Secondary | ICD-10-CM

## 2023-12-21 DIAGNOSIS — Z6841 Body Mass Index (BMI) 40.0 and over, adult: Secondary | ICD-10-CM

## 2023-12-21 DIAGNOSIS — R5383 Other fatigue: Secondary | ICD-10-CM

## 2023-12-21 DIAGNOSIS — I5032 Chronic diastolic (congestive) heart failure: Secondary | ICD-10-CM | POA: Diagnosis present

## 2023-12-21 DIAGNOSIS — Z87891 Personal history of nicotine dependence: Secondary | ICD-10-CM

## 2023-12-21 DIAGNOSIS — I959 Hypotension, unspecified: Secondary | ICD-10-CM

## 2023-12-21 DIAGNOSIS — D539 Nutritional anemia, unspecified: Secondary | ICD-10-CM | POA: Diagnosis present

## 2023-12-21 DIAGNOSIS — Z79890 Hormone replacement therapy (postmenopausal): Secondary | ICD-10-CM

## 2023-12-21 DIAGNOSIS — K7469 Other cirrhosis of liver: Secondary | ICD-10-CM | POA: Diagnosis present

## 2023-12-21 DIAGNOSIS — Z66 Do not resuscitate: Secondary | ICD-10-CM | POA: Diagnosis present

## 2023-12-21 DIAGNOSIS — G8929 Other chronic pain: Secondary | ICD-10-CM | POA: Diagnosis present

## 2023-12-21 DIAGNOSIS — I4821 Permanent atrial fibrillation: Secondary | ICD-10-CM | POA: Diagnosis present

## 2023-12-21 DIAGNOSIS — Z811 Family history of alcohol abuse and dependence: Secondary | ICD-10-CM

## 2023-12-21 DIAGNOSIS — D631 Anemia in chronic kidney disease: Secondary | ICD-10-CM | POA: Diagnosis present

## 2023-12-21 DIAGNOSIS — I700 Atherosclerosis of aorta: Secondary | ICD-10-CM | POA: Diagnosis present

## 2023-12-21 DIAGNOSIS — Z7901 Long term (current) use of anticoagulants: Secondary | ICD-10-CM

## 2023-12-21 DIAGNOSIS — G629 Polyneuropathy, unspecified: Secondary | ICD-10-CM | POA: Diagnosis present

## 2023-12-21 DIAGNOSIS — R251 Tremor, unspecified: Secondary | ICD-10-CM | POA: Diagnosis present

## 2023-12-21 DIAGNOSIS — Z91030 Bee allergy status: Secondary | ICD-10-CM

## 2023-12-21 DIAGNOSIS — Z9884 Bariatric surgery status: Secondary | ICD-10-CM

## 2023-12-21 DIAGNOSIS — R55 Syncope and collapse: Principal | ICD-10-CM

## 2023-12-21 DIAGNOSIS — K219 Gastro-esophageal reflux disease without esophagitis: Secondary | ICD-10-CM | POA: Diagnosis present

## 2023-12-21 DIAGNOSIS — N1831 Chronic kidney disease, stage 3a: Secondary | ICD-10-CM | POA: Diagnosis present

## 2023-12-21 DIAGNOSIS — Z9049 Acquired absence of other specified parts of digestive tract: Secondary | ICD-10-CM

## 2023-12-21 LAB — COMPREHENSIVE METABOLIC PANEL WITH GFR
ALT: 18 U/L (ref 0–44)
AST: 31 U/L (ref 15–41)
Albumin: 3.5 g/dL (ref 3.5–5.0)
Alkaline Phosphatase: 86 U/L (ref 38–126)
Anion gap: 15 (ref 5–15)
BUN: 22 mg/dL (ref 8–23)
CO2: 26 mmol/L (ref 22–32)
Calcium: 9.7 mg/dL (ref 8.9–10.3)
Chloride: 96 mmol/L — ABNORMAL LOW (ref 98–111)
Creatinine, Ser: 1.47 mg/dL — ABNORMAL HIGH (ref 0.61–1.24)
GFR, Estimated: 49 mL/min — ABNORMAL LOW (ref >60)
Glucose, Bld: 128 mg/dL — ABNORMAL HIGH (ref 70–99)
Potassium: 4.1 mmol/L (ref 3.5–5.1)
Sodium: 137 mmol/L (ref 135–145)
Total Bilirubin: 1 mg/dL (ref 0.0–1.2)
Total Protein: 7.6 g/dL (ref 6.5–8.1)

## 2023-12-21 LAB — CBC WITH DIFFERENTIAL/PLATELET
Abs Immature Granulocytes: 0.03 10*3/uL (ref 0.00–0.07)
Basophils Absolute: 0 10*3/uL (ref 0.0–0.1)
Basophils Relative: 0 %
Eosinophils Absolute: 0.1 10*3/uL (ref 0.0–0.5)
Eosinophils Relative: 1 %
HCT: 43.7 % (ref 39.0–52.0)
Hemoglobin: 14.2 g/dL (ref 13.0–17.0)
Immature Granulocytes: 0 %
Lymphocytes Relative: 8 %
Lymphs Abs: 0.6 10*3/uL — ABNORMAL LOW (ref 0.7–4.0)
MCH: 32.1 pg (ref 26.0–34.0)
MCHC: 32.5 g/dL (ref 30.0–36.0)
MCV: 98.9 fL (ref 80.0–100.0)
Monocytes Absolute: 0.9 10*3/uL (ref 0.1–1.0)
Monocytes Relative: 12 %
Neutro Abs: 6.1 10*3/uL (ref 1.7–7.7)
Neutrophils Relative %: 79 %
Platelets: 185 10*3/uL (ref 150–400)
RBC: 4.42 MIL/uL (ref 4.22–5.81)
RDW: 13.9 % (ref 11.5–15.5)
WBC: 7.7 10*3/uL (ref 4.0–10.5)
nRBC: 0 % (ref 0.0–0.2)

## 2023-12-21 LAB — MAGNESIUM: Magnesium: 2.1 mg/dL (ref 1.7–2.4)

## 2023-12-21 LAB — I-STAT CG4 LACTIC ACID, ED
Lactic Acid, Venous: 2.2 mmol/L (ref 0.5–1.9)
Lactic Acid, Venous: 1.7 mmol/L (ref 0.5–1.9)

## 2023-12-21 LAB — DIGOXIN LEVEL: Digoxin Level: 2 ng/mL (ref 0.8–2.0)

## 2023-12-21 LAB — BRAIN NATRIURETIC PEPTIDE: B Natriuretic Peptide: 252.2 pg/mL — ABNORMAL HIGH (ref 0.0–100.0)

## 2023-12-21 LAB — TROPONIN I (HIGH SENSITIVITY)
Troponin I (High Sensitivity): 41 ng/L — ABNORMAL HIGH (ref <18)
Troponin I (High Sensitivity): 39 ng/L — ABNORMAL HIGH (ref <18)

## 2023-12-21 MED ORDER — GABAPENTIN 300 MG PO CAPS
300.0000 mg | ORAL_CAPSULE | Freq: Two times a day (BID) | ORAL | Status: DC
  Administered 2023-12-22 – 2023-12-26 (×9): 300 mg via ORAL
  Filled 2023-12-21 (×9): qty 1

## 2023-12-21 MED ORDER — ACETAMINOPHEN 650 MG RE SUPP: 650.0000 mg | Freq: Four times a day (QID) | RECTAL | Status: DC | PRN

## 2023-12-21 MED ORDER — RIVAROXABAN 10 MG PO TABS
20.0000 mg | ORAL_TABLET | Freq: Every evening | ORAL | Status: DC
  Administered 2023-12-22 – 2023-12-25 (×3): 20 mg via ORAL
  Filled 2023-12-21: qty 2
  Filled 2023-12-21: qty 1
  Filled 2023-12-21: qty 2

## 2023-12-21 MED ORDER — LEVALBUTEROL HCL 0.63 MG/3ML IN NEBU: 0.6300 mg | INHALATION_SOLUTION | Freq: Four times a day (QID) | RESPIRATORY_TRACT | Status: DC | PRN

## 2023-12-21 MED ORDER — PANTOPRAZOLE SODIUM 40 MG PO TBEC
40.0000 mg | DELAYED_RELEASE_TABLET | Freq: Every day | ORAL | Status: DC
  Administered 2023-12-22 – 2023-12-26 (×5): 40 mg via ORAL
  Filled 2023-12-21 (×6): qty 1

## 2023-12-21 MED ORDER — SENNOSIDES-DOCUSATE SODIUM 8.6-50 MG PO TABS: 1.0000 | ORAL_TABLET | Freq: Every evening | ORAL | Status: DC | PRN

## 2023-12-21 MED ORDER — MIDODRINE HCL 5 MG PO TABS
10.0000 mg | ORAL_TABLET | Freq: Three times a day (TID) | ORAL | Status: DC
  Administered 2023-12-22: 10 mg via ORAL
  Filled 2023-12-21: qty 2

## 2023-12-21 MED ORDER — SODIUM CHLORIDE 0.9% FLUSH
3.0000 mL | Freq: Two times a day (BID) | INTRAVENOUS | Status: DC
  Administered 2023-12-22 – 2023-12-26 (×9): 3 mL via INTRAVENOUS

## 2023-12-21 MED ORDER — ONDANSETRON HCL 4 MG PO TABS
4.0000 mg | ORAL_TABLET | Freq: Four times a day (QID) | ORAL | Status: DC | PRN
  Administered 2023-12-24: 4 mg via ORAL
  Filled 2023-12-21: qty 1

## 2023-12-21 MED ORDER — IOHEXOL 350 MG/ML SOLN
100.0000 mL | Freq: Once | INTRAVENOUS | Status: AC | PRN
  Administered 2023-12-21: 100 mL via INTRAVENOUS

## 2023-12-21 MED ORDER — ACETAMINOPHEN 325 MG PO TABS
650.0000 mg | ORAL_TABLET | Freq: Four times a day (QID) | ORAL | Status: DC | PRN
  Administered 2023-12-22: 650 mg via ORAL
  Filled 2023-12-21: qty 2

## 2023-12-21 MED ORDER — ONDANSETRON HCL 4 MG/2ML IJ SOLN: 4.0000 mg | Freq: Four times a day (QID) | INTRAMUSCULAR | Status: DC | PRN

## 2023-12-21 MED ORDER — SODIUM CHLORIDE 0.9 % IV BOLUS
500.0000 mL | Freq: Once | INTRAVENOUS | Status: AC
  Administered 2023-12-21: 500 mL via INTRAVENOUS

## 2023-12-21 MED ORDER — GADOBUTROL 1 MMOL/ML IV SOLN
10.0000 mL | Freq: Once | INTRAVENOUS | Status: AC | PRN
  Administered 2023-12-21: 10 mL via INTRAVENOUS

## 2023-12-21 MED ORDER — FLUDROCORTISONE ACETATE 0.1 MG PO TABS
0.0500 mg | ORAL_TABLET | Freq: Every day | ORAL | Status: DC
  Filled 2023-12-21: qty 0.5

## 2023-12-21 NOTE — ED Provider Triage Note (Signed)
 Emergency Medicine Provider Triage Evaluation Note  [name] , a 78 y.o. male  was evaluated in triage.  Pt complains of chronic feelings of dizziness and feelings of presyncope for the past week.  Denies any chest pain or shortness of breath.  Also reports chronic feelings of fatigue, decreased appetite.  Reports he started on a couple new medications-digoxin , furosemide , midodrine   Review of Systems  Positive: As above Negative: As above  Physical Exam  BP 118/73 (BP Location: Right Arm)   Pulse 80   Temp 98.1 F (36.7 C)   Resp 17   Ht 5\' 8"  (1.727 m)   Wt (!) 157.9 kg   SpO2 99%   BMI 52.93 kg/m  Gen:   Awake, no distress   Resp:  Normal effort  MSK:   Moves extremities without difficulty  Other:  Toes appear purple bilaterally.  2+ dorsalis pedis pulse bilaterally  Medical Decision Making  Medically screening exam initiated at 3:55 PM.  Appropriate orders placed.  [name] was informed that the remainder of the evaluation will be completed by another provider, this initial triage assessment does not replace that evaluation, and the importance of remaining in the ED until their evaluation is complete.     Rexie Catena, PA-C 12/21/23 1558

## 2023-12-21 NOTE — ED Triage Notes (Signed)
 Pt was sent for MRI from Cardiologist but has been have dizziness and near syncope x 1 week but worse this morning. Also complains of decreased appetite and increased anxiety. Recent change in medication and unsure if that is cause of symptoms.  New Medication- digoxin , furosemide , midodrine .

## 2023-12-21 NOTE — H&P (Signed)
 History and Physical    NAT LOWENTHAL ZOX:[id number] DOB: [birth year] DOA: 12/21/2023  PCP: Medicine, [facility]   Patient coming from: Home   Chief Complaint: Lethargy, near-syncope   HPI: [name] is a 78 y.o. male with medical history significant for cirrhosis, atrial fibrillation on Xarelto , chronic HFpEF, chronic orthostatic hypotension, BMI 53, and OSA on CPAP who presents with lethargy and near syncope.  Patient presented with the same symptoms and was admitted last month, had positive orthostatic vitals, normal B12, folate, and TSH, normal recent ACTH  stim test, and echocardiogram with preserved EF.  His midodrine  was increased at that time and he was given IV fluids with subjective improvement.  He was subsequently admitted to a [facility] hospital for acute on chronic HFpEF, and was diuresed.  Since then, he has continued to experience episodes of near syncope and lethargy.  This has worsened significantly over the past week.  Previously, he would develop near syncope when sitting up or changing positions rapidly, but reports that he was experiencing the symptoms even while at rest today.  He states that he is unable to get up from his bed today due to generalized weakness.  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  He is not experiencing chest pain, denies any change in his chronic dyspnea, and denies fevers or chills.  ED Course: Upon arrival to the ED, patient is found to be afebrile and saturating well on 2 L/min of supplemental oxygen with normal RR, normal HR, and labile BP.  Labs are most notable for creatinine 1.47, normal WBC, normal hemoglobin, lactic acid 2.2, troponin 41, and BNP 252.  Cardiology was consulted by the ED physician and the patient was given 500 mL of saline.  Review of Systems:  All other systems reviewed  and apart from HPI, are negative.  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    Past Surgical History:  Procedure Laterality Date   APPENDECTOMY     ARTERY BIOPSY Left 12/30/2017   Procedure: BIOPSY TEMPORAL ARTERY;  Surgeon: Boyce Byes, MD;  Location: [facility] OR[facility];  Service: General;  Laterality: Left;   CHOLECYSTECTOMY     COLONOSCOPY W/ POLYPECTOMY     ESOPHAGOGASTRODUODENOSCOPY (EGD) WITH PROPOFOL   07/06/2012   Procedure: ESOPHAGOGASTRODUODENOSCOPY (EGD) WITH PROPOFOL ;  Surgeon: Mathew Solomon, MD;  Location: [facility];  Service: Endoscopy;  Laterality: N/A;   ESOPHAGOSCOPY     EYE SURGERY     left eye- muscle repair   HERNIA REPAIR     ventral hernia   INCISION AND DRAINAGE HIP  Left 03/17/2018   Procedure: IRRIGATION AND DEBRIDEMENT LEFT THIGH WOUND;  Surgeon: Liliane Rei, MD;  Location: [facility];  Service: Orthopedics;  Laterality: Left;   JOINT REPLACEMENT     bilateral hips.  Right broke and had to be re placed again   MASS EXCISION N/A 03/02/2015   Procedure: EXCISION ABDOMINAL WALL MASS;  Surgeon: Juanita Norlander, MD;  Location: [facility];  Service: General;  Laterality: N/A;   TOTAL HIP REVISION Left 02/17/2018   Procedure: Left total hip arthroplasty revision;  Surgeon:  Liliane Rei, MD;  Location: [facility];  Service: Orthopedics;  Laterality: Left;   VERTICAL BANDED GASTROPLASTY      Social History:   reports that he quit smoking about 39 years ago. His smoking use included cigarettes. He started smoking about 57 years ago. He has a 35.6 pack-year smoking history. He has never used smokeless tobacco. He reports that he does not currently use alcohol . He reports that he does not use drugs.  Allergies  Allergen Reactions   Bee Venom Anaphylaxis and Other (See Comments)    Noted from [place]. honey bee venom   Nickel Rash and Other (See Comments)    "Breaks me out"    Family History  Problem Relation Age of Onset   Cancer Mother    Alcohol  abuse Father      Prior to Admission medications   Medication Sig Start Date End Date Taking? Authorizing Provider  alum & mag hydroxide-simeth (MAALOX/MYLANTA) 200-200-20 MG/5ML suspension Take 15 mLs by mouth every 6 (six) hours as needed for indigestion or heartburn.    [provider]  digoxin  (LANOXIN ) 0.25 MG tablet Take 1 tablet (0.25 mg total) by mouth daily. 11/01/23   Justina Oman, MD  DM-Doxylamine-Acetaminophen  (NYQUIL COLD & FLU PO) Take 15 mLs by mouth daily as needed (cold/cough).    [provider]  Ensure Max Protein (ENSURE MAX PROTEIN) LIQD Take 330 mLs (11 oz total) by mouth 2 (two) times daily. 12/07/18   Singh, Prashant K, MD  escitalopram  (LEXAPRO ) 10 MG tablet Take 1 tablet (10 mg total) by mouth daily. 09/02/18   Hongalgi, Anand D, MD  fludrocortisone  (FLORINEF ) 0.1 MG tablet Take 0.5 tablets (0.05 mg total) by mouth daily. 11/17/23   Gerald Kitty., NP  furosemide  (LASIX ) 80 MG tablet Take by mouth. 11/26/23   [provider]  gabapentin  (NEURONTIN ) 300 MG capsule Take 1 capsule (300 mg total) by mouth 2 (two) times daily. 11/17/23   Dick, Ernest H Jr., NP  ibuprofen (ADVIL) 200 MG tablet Take 400 mg by mouth every 6 (six) hours as needed for mild pain (pain score  1-3).    [provider]  levalbuterol  (XOPENEX  HFA) 45 MCG/ACT inhaler Inhale 2 puffs into the lungs every 6 (six) hours as needed for wheezing or shortness of breath. 10/31/23 10/30/24  Justina Oman, MD  Melatonin 3 MG TABS Take 2 tablets (6 mg total) by mouth at bedtime. 09/11/18   Krishnan, Gokul, MD  midodrine  (PROAMATINE ) 10 MG tablet Take 1 tablet (10 mg total) by mouth 3 (three) times daily with meals. 11/17/23   Gerald Kitty., NP  Multiple Vitamins-Minerals (CENTRUM ADULTS PO) Take 1 tablet by mouth every evening.     [provider]  pantoprazole  (PROTONIX ) 40 MG tablet Take 1 tablet (40 mg total) by mouth daily. 09/02/18   Hongalgi, Anand D, MD  promethazine  (PHENERGAN ) 25 MG tablet Take 25 mg by mouth every 6 (  six) hours as needed for nausea or vomiting. 09/22/23   [provider]  traZODone  (DESYREL ) 50 MG tablet Take 1 tablet (50 mg total) by mouth at bedtime as needed for sleep. 11/26/18   Vada Garibaldi, MD  XARELTO  20 MG TABS tablet Take 20 mg by mouth every evening. 09/18/20   [provider]    Physical Exam: Vitals:   12/21/23 1748 12/21/23 2017 12/21/23 2045 12/21/23 2130  BP: 92/76 (!) 73/57 (!) 122/57 (!) 134/48  Pulse: 80 77 74 78  Resp: 18 19  20   Temp:  99.3 F (37.4 C)    TempSrc:  Oral    SpO2: 100% 100% 99% 100%  Weight:      Height:        Constitutional: NAD, no diaphoresis   Eyes: PERTLA, lids and conjunctivae normal ENMT: Mucous membranes are moist. Posterior pharynx clear of any exudate or lesions.   Neck: supple, no masses  Respiratory: no wheezing, no crackles. No accessory muscle use.  Cardiovascular: S1 & S2 heard, regular rate and rhythm. No extremity edema.   Abdomen: No tenderness, soft. Bowel sounds active.  Musculoskeletal: no clubbing / cyanosis. No joint deformity upper and lower extremities.   Skin: no significant rashes, lesions, ulcers. Warm, dry, well-perfused. Neurologic: CN 2-12 grossly intact. Moving  all extremities. Alert and oriented.  Psychiatric: Calm. Cooperative.    Labs and Imaging on Admission: I have personally reviewed following labs and imaging studies  CBC: Recent Labs  Lab 12/21/23 1600  WBC 7.7  NEUTROABS 6.1  HGB 14.2  HCT 43.7  MCV 98.9  PLT 185   Basic Metabolic Panel: Recent Labs  Lab 12/21/23 1600  NA 137  K 4.1  CL 96*  CO2 26  GLUCOSE 128*  BUN 22  CREATININE 1.47*  CALCIUM  9.7  MG 2.1   GFR: Estimated Creatinine Clearance: 62 mL/min (A) (by C-G formula based on SCr of 1.47 mg/dL (H)). Liver Function Tests: Recent Labs  Lab 12/21/23 1600  AST 31  ALT 18  ALKPHOS 86  BILITOT 1.0  PROT 7.6  ALBUMIN  3.5   No results for input(s): "LIPASE", "AMYLASE" in the last 168 hours. No results for input(s): "AMMONIA" in the last 168 hours. Coagulation Profile: No results for input(s): "INR", "PROTIME" in the last 168 hours. Cardiac Enzymes: No results for input(s): "CKTOTAL", "CKMB", "CKMBINDEX", "TROPONINI" in the last 168 hours. BNP (last 3 results) No results for input(s): "PROBNP" in the last 8760 hours. HbA1C: No results for input(s): "HGBA1C" in the last 72 hours. CBG: No results for input(s): "GLUCAP" in the last 168 hours. Lipid Profile: No results for input(s): "CHOL", "HDL", "LDLCALC", "TRIG", "CHOLHDL", "LDLDIRECT" in the last 72 hours. Thyroid  Function Tests: No results for input(s): "TSH", "T4TOTAL", "FREET4", "T3FREE", "THYROIDAB" in the last 72 hours. Anemia Panel: No results for input(s): "VITAMINB12", "FOLATE", "FERRITIN", "TIBC", "IRON", "RETICCTPCT" in the last 72 hours. Urine analysis:    Component Value Date/Time   COLORURINE YELLOW 11/06/2023 0810   APPEARANCEUR CLEAR 11/06/2023 0810   APPEARANCEUR Clear 01/18/2018 1539   LABSPEC 1.030 11/06/2023 0810   PHURINE 7.0 11/06/2023 0810   GLUCOSEU NEGATIVE 11/06/2023 0810   HGBUR NEGATIVE 11/06/2023 0810   BILIRUBINUR NEGATIVE 11/06/2023 0810   BILIRUBINUR Negative  01/18/2018 1539   KETONESUR NEGATIVE 11/06/2023 0810   PROTEINUR NEGATIVE 11/06/2023 0810   UROBILINOGEN 0.2 09/06/2012 0213   NITRITE NEGATIVE 11/06/2023 0810   LEUKOCYTESUR NEGATIVE 11/06/2023 0810   Sepsis Labs: @LABRCNTIP (procalcitonin:4,lacticidven:4) )No results  found for this or any previous visit (from the past 240 hours).   Radiological Exams on Admission: DG Chest Portable 1 View Result Date: 12/21/2023 CLINICAL DATA:  Hypoxia short of breath EXAM: PORTABLE CHEST 1 VIEW COMPARISON:  11/05/2023, 10/07/2018 FINDINGS: Mild cardiomegaly with aortic atherosclerosis. Chronic pleural changes and areas of scarring. No acute confluent airspace disease or pneumothorax. IMPRESSION: No active disease. Mild cardiomegaly. Chronic pleural changes and areas of scarring. Electronically Signed   By: Esmeralda Hedge M.D.   On: 12/21/2023 22:11   CT ANGIO LOWER EXT BILAT W &/OR WO CONTRAST Result Date: 12/21/2023 CLINICAL DATA:  Bilateral toe discoloration EXAM: CT ANGIOGRAPHY OF ABDOMINAL AORTA WITH ILIOFEMORAL RUNOFF TECHNIQUE: Multidetector CT imaging of the abdomen, pelvis and lower extremities was performed using the standard protocol during bolus administration of intravenous contrast. Multiplanar CT image reconstructions and MIPs were obtained to evaluate the vascular anatomy. RADIATION DOSE REDUCTION: This exam was performed according to the departmental dose-optimization program which includes automated exposure control, adjustment of the mA and/or kV according to patient size and/or use of iterative reconstruction technique. CONTRAST:  OMNIPAQUE  IOHEXOL  350 MG/ML SOLN COMPARISON:  Chest CT 10/07/2018 FINDINGS: VASCULAR Aorta: Normal caliber aorta without aneurysm, dissection, vasculitis or significant stenosis. Moderate aortic atherosclerosis. Celiac: Mild stenosis of the proximal celiac artery but widely patent distal branches. No dissection, aneurysm or occlusion SMA: At least moderate focal  stenosis of the proximal SMA about a cm distal to the origin. Distal vascular patency. No aneurysm or occlusion Renals: Single right and single left renal arteries. Calcified origins limits assessment for stenosis. No high-grade stenosis is seen. Suspect mild origin stenosis. IMA: Diminutive but patent. RIGHT Lower Extremity Inflow: Common, internal and external iliac arteries are patent without evidence of aneurysm, dissection, vasculitis or significant stenosis. Distal external iliac artery assessment limited by artifact from right hip hardware Outflow: Slightly limited assessment of right common femoral artery due to artifact from right hip hardware. Superficial and deep femoral arteries appear patent. Mild to moderate diffuse disease without high-grade focal stenosis or occlusion. Moderate disease of the popliteal artery without occlusion, aneurysm or high-grade stenosis. Runoff: Assessment of the posterior tibial artery is limited secondary to diffuse calcific disease. Definite patency of the anterior tibial and fibular arteries to the ankle, anterior tibial artery enhances at the dorsum of the foot. LEFT Lower Extremity Inflow: Common, internal and external iliac arteries are patent without evidence of aneurysm, dissection, vasculitis or significant stenosis. Moderate atherosclerosis. Partially obscured left common femoral artery from artifact arising from left hip hardware. Outflow: Left common femoral and deep femoral arteries appear grossly patent. Moderate focal stenosis of the proximal superficial femoral artery, series 6, image 287. This is about 6 cm distal to the origin of the SFA. Popliteal artery shows no significant stenosis, aneurysm or occlusion. Runoff: Assessment of the posterior tibial artery is limited due to diffuse calcific disease. Definite patency of the anterior tibial and fibular arteries to the ankle, anterior tibial artery enhances at the dorsum of the foot. Probable patency of  posterior tibial artery to the level of the ankle. Veins: Suboptimally evaluated Review of the MIP images confirms the above findings. 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 2 suspected areas of pulmonary scarring in the lower lobes and left upper lobe. Cardiomegaly. Hepatobiliary: Cholecystectomy. No focal hepatic abnormality. Extrahepatic biliary dilatation, dilated common bile duct measuring up to 15 mm. Pancreas: Atrophic.  No inflammation Spleen: Possible small splenic calcification as may be seen with granuloma. Adrenals/Urinary Tract: Adrenal glands are normal. Kidneys are atrophic with cortical scarring. No hydronephrosis. Small kidney stones. Right renal cyst for which no imaging follow-up is recommended. The bladder is obscured by artifact Stomach/Bowel: Gastric bypass changes. No acute bowel wall thickening or evidence for obstruction. Lymphatic: No suspicious lymph nodes Reproductive: Obscured by artifact Other: Negative for pelvic effusion or free air Musculoskeletal: Bilateral hip replacements with artifact. No definite acute osseous abnormality is seen. Multilevel degenerative changes. Mild atrophy of thigh and calf muscles. No focal fluid collections. IMPRESSION: VASCULAR 1. Moderate aortic atherosclerosis without dissection, aneurysm or acute occlusive disease. 2. Diffusely diseased lower extremity vessels. At least moderate focal stenosis of the left superficial femoral artery as above. 3. Limited assessment of the posterior tibial arteries secondary to diffuse calcific disease. Definite 2 vessel patency to the ankle via the anterior tibial and fibular arteries. Anterior tibial arteries enhance at the dorsum of the foot. 4. 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 Areas of scarring in the left base and left upper lobe. Cardiomegaly 2. No definite CT evidence for acute intra-abdominal or pelvic abnormality 3. Cholecystectomy. Extrahepatic biliary dilatation which may be correlated with LFTs 4. Nonobstructing kidney stones Electronically Signed   By: Esmeralda Hedge M.D.   On: 12/21/2023 22:10    EKG: Independently reviewed. Atrial fibrillation.   Assessment/Plan   1. Near-syncope  - In a fib on EKG with narrow QRS, normal QT, and no blocks - Given 500 mL fluid bolus in ED    - Check orthostatic vitals, continue cardiac monitoring, hold diuretic for now  2. Chronic HFpEF  - Appears compensated  - Hold diuretic and digoxin  for now, monitor weight and I/Os    3. PAF  - Xarelto     4. Chronic hypoxic respiratory failure; OSA  - Continue CPAP while sleeping, continue supplemental O2 as-needed    5. Cirrhosis  - Appears compensated  - Holding diuretic initially    DVT prophylaxis: Xarelto   Code Status: DNR, wants to be intubated for reversible/treatable conditions but would not want chest compressions or other heroic measures  Level of Care: Level of care: Telemetry Cardiac Family Communication: Caregiver updated in ED  Disposition Plan:  Patient is from: Home  Anticipated d/c is to: TBD Anticipated d/c date is: 4/22 or 12/23/23  Patient currently: Pending cardiology consultation, orthostatic vitals, cardiac monitoring  Consults called: Cardiology  Admission status: Observation     Walton Guppy, MD [facility]  12/21/2023, 11:49 PM

## 2023-12-21 NOTE — ED Provider Notes (Signed)
 [place] EMERGENCY DEPARTMENT AT [facility] Provider Note   CSN: [id number] Arrival date & time: 12/21/23  1531     History  Chief Complaint  Patient presents with   Near Syncope    [name] is a 78 y.o. male with history of permanent A-fib on digoxin  and Xarelto , obesity status post Roux-en-Y bypass, chronic orthostatic hypotension on midodrine  10 mg 3 times daily, presenting to the ED with concern for weakness, dizziness, feeling faint.  The patient reports he was started on digoxin  approximate 1 month ago as he was not tolerating other rate control medications for Eliquis .  He was discharged in the hospital 1 month ago on March 10 after admission for generalized weakness, low blood pressure.  He says he has been doing very poorly at home, particular over the past week, and said he fell a "I was going to die today".  Specifically reports persistent lightheadedness, both sitting and with standing.  He reports nausea.  He reports extreme weakness such that he cannot move his arms or legs.  His caretaker is present at the bedside and confirms that the symptoms appear to be worsening.  He does take Lasix  daily for heart failure  On his most recent hospitalization I reviewed his discharge summary.  He is noted to have generalized weakness with near syncope episodes and orthostatic hypotension.  He was on midodrine  20 mg 3 times daily at that time.  He was given some fluid resuscitation and Lasix  was held.  He is on gabapentin  for chronic back pain.  He is on Xarelto  and digoxin  for A-fib.  He has known history of macrocytic anemia with baseline hemoglobin approximately 12.  Today the patient had a cardiac MRI performed.  The results were not yet available.  Echocardiogram from March 2025 was poor quality with EF grossly normal, estimated 55% ejection fracture.  While in the waiting room today the patient and his caretaker were concerned that his toes had a purple  discoloration.  They report that his toes are often purple appearing but were much darker purple, deep and spending the day with his feet down.  HPI     Home Medications Prior to Admission medications   Medication Sig Start Date End Date Taking? Authorizing Provider  alum & mag hydroxide-simeth (MAALOX/MYLANTA) 200-200-20 MG/5ML suspension Take 15 mLs by mouth every 6 (six) hours as needed for indigestion or heartburn.    [provider]  digoxin  (LANOXIN ) 0.25 MG tablet Take 1 tablet (0.25 mg total) by mouth daily. 11/01/23   Justina Oman, MD  DM-Doxylamine-Acetaminophen  (NYQUIL COLD & FLU PO) Take 15 mLs by mouth daily as needed (cold/cough).    [provider]  Ensure Max Protein (ENSURE MAX PROTEIN) LIQD Take 330 mLs (11 oz total) by mouth 2 (two) times daily. 12/07/18   Singh, Prashant K, MD  escitalopram  (LEXAPRO ) 10 MG tablet Take 1 tablet (10 mg total) by mouth daily. 09/02/18   Hongalgi, Anand D, MD  fludrocortisone  (FLORINEF ) 0.1 MG tablet Take 0.5 tablets (0.05 mg total) by mouth daily. 11/17/23   Gerald Kitty., NP  furosemide  (LASIX ) 80 MG tablet Take by mouth. 11/26/23   [provider]  gabapentin  (NEURONTIN ) 300 MG capsule Take 1 capsule (300 mg total) by mouth 2 (two) times daily. 11/17/23   Dick, Ernest H Jr., NP  ibuprofen (ADVIL) 200 MG tablet Take 400 mg by mouth every 6 (six) hours as needed for mild pain (pain score 1-3).  [provider]  levalbuterol  (XOPENEX  HFA) 45 MCG/ACT inhaler Inhale 2 puffs into the lungs every 6 (six) hours as needed for wheezing or shortness of breath. 10/31/23 10/30/24  Justina Oman, MD  Melatonin 3 MG TABS Take 2 tablets (6 mg total) by mouth at bedtime. 09/11/18   Krishnan, Gokul, MD  midodrine  (PROAMATINE ) 10 MG tablet Take 1 tablet (10 mg total) by mouth 3 (three) times daily with meals. 11/17/23   Gerald Kitty., NP  Multiple Vitamins-Minerals (CENTRUM ADULTS PO) Take 1 tablet by mouth every evening.      [provider]  pantoprazole  (PROTONIX ) 40 MG tablet Take 1 tablet (40 mg total) by mouth daily. 09/02/18   Hongalgi, Anand D, MD  promethazine  (PHENERGAN ) 25 MG tablet Take 25 mg by mouth every 6 (six) hours as needed for nausea or vomiting. 09/22/23   [provider]  traZODone  (DESYREL ) 50 MG tablet Take 1 tablet (50 mg total) by mouth at bedtime as needed for sleep. 11/26/18   Vada Garibaldi, MD  XARELTO  20 MG TABS tablet Take 20 mg by mouth every evening. 09/18/20   [provider]      Allergies    Bee venom and Nickel    Review of Systems   Review of Systems  Physical Exam Updated Vital Signs BP (!) 134/48   Pulse 78   Temp 99.3 F (37.4 C) (Oral)   Resp 20   Ht 5\' 8"  (1.727 m)   Wt (!) 157.9 kg   SpO2 100%   BMI 52.93 kg/m  Physical Exam Constitutional:      General: He is not in acute distress.    Appearance: He is obese.  HENT:     Head: Normocephalic and atraumatic.  Eyes:     Conjunctiva/sclera: Conjunctivae normal.     Pupils: Pupils are equal, round, and reactive to light.  Cardiovascular:     Rate and Rhythm: Normal rate and regular rhythm.  Pulmonary:     Effort: Pulmonary effort is normal. No respiratory distress.  Abdominal:     General: There is no distension.     Tenderness: There is no abdominal tenderness.  Musculoskeletal:     Comments: Symmetrical discoloration with mild purpling of the bilateral feet, distal toes, coolness to the toes, 3 sec cap refill, pedal pulses faint but present  Skin:    General: Skin is warm and dry.  Neurological:     General: No focal deficit present.     Mental Status: He is alert. Mental status is at baseline.  Psychiatric:        Mood and Affect: Mood normal.        Behavior: Behavior normal.     ED Results / Procedures / Treatments   Labs (all labs ordered are listed, but only abnormal results are displayed) Labs Reviewed  CBC WITH DIFFERENTIAL/PLATELET - Abnormal; Notable for the  following components:      Result Value   Lymphs Abs 0.6 (*)    All other components within normal limits  COMPREHENSIVE METABOLIC PANEL WITH GFR - Abnormal; Notable for the following components:   Chloride 96 (*)    Glucose, Bld 128 (*)    Creatinine, Ser 1.47 (*)    GFR, Estimated 49 (*)    All other components within normal limits  BRAIN NATRIURETIC PEPTIDE - Abnormal; Notable for the following components:   B Natriuretic Peptide 252.2 (*)    All other components within normal limits  I-STAT CG4 LACTIC ACID, ED - Abnormal; Notable for the following components:   Lactic Acid, Venous 2.2 (*)    All other components within normal limits  TROPONIN I (HIGH SENSITIVITY) - Abnormal; Notable for the following components:   Troponin I (High Sensitivity) 41 (*)    All other components within normal limits  TROPONIN I (HIGH SENSITIVITY) - Abnormal; Notable for the following components:   Troponin I (High Sensitivity) 39 (*)    All other components within normal limits  MAGNESIUM   DIGOXIN  LEVEL  BASIC METABOLIC PANEL WITH GFR  CBC  MAGNESIUM   PHOSPHORUS  I-STAT CG4 LACTIC ACID, ED    EKG EKG Interpretation Date/Time:  Monday December 21 2023 16:07:20 EDT Ventricular Rate:  80 PR Interval:    QRS Duration:  74 QT Interval:  336 QTC Calculation: 387 R Axis:   100  Text Interpretation: Atrial fibrillation ST & T wave abnormality, consider lateral ischemia Abnormal ECG When compared with ECG of 05-Nov-2023 21:17, PREVIOUS ECG IS PRESENT Confirmed by Jerald Molly [phone]) on 12/21/2023 7:09:29 PM  Radiology DG Chest Portable 1 View Result Date: 12/21/2023 CLINICAL DATA:  Hypoxia short of breath EXAM: PORTABLE CHEST 1 VIEW COMPARISON:  11/05/2023, 10/07/2018 FINDINGS: Mild cardiomegaly with aortic atherosclerosis. Chronic pleural changes and areas of scarring. No acute confluent airspace disease or pneumothorax. IMPRESSION: No active disease. Mild cardiomegaly. Chronic pleural changes and  areas of scarring. Electronically Signed   By: Esmeralda Hedge M.D.   On: 12/21/2023 22:11   CT ANGIO LOWER EXT BILAT W &/OR WO CONTRAST Result Date: 12/21/2023 CLINICAL DATA:  Bilateral toe discoloration EXAM: CT ANGIOGRAPHY OF ABDOMINAL AORTA WITH ILIOFEMORAL RUNOFF TECHNIQUE: Multidetector CT imaging of the abdomen, pelvis and lower extremities was performed using the standard protocol during bolus administration of intravenous contrast. Multiplanar CT image reconstructions and MIPs were obtained to evaluate the vascular anatomy. RADIATION DOSE REDUCTION: This exam was performed according to the departmental dose-optimization program which includes automated exposure control, adjustment of the mA and/or kV according to patient size and/or use of iterative reconstruction technique. CONTRAST:  OMNIPAQUE  IOHEXOL  350 MG/ML SOLN COMPARISON:  Chest CT 10/07/2018 FINDINGS: VASCULAR Aorta: Normal caliber aorta without aneurysm, dissection, vasculitis or significant stenosis. Moderate aortic atherosclerosis. Celiac: Mild stenosis of the proximal celiac artery but widely patent distal branches. No dissection, aneurysm or occlusion SMA: At least moderate focal stenosis of the proximal SMA about a cm distal to the origin. Distal vascular patency. No aneurysm or occlusion Renals: Single right and single left renal arteries. Calcified origins limits assessment for stenosis. No high-grade stenosis is seen. Suspect mild origin stenosis. IMA: Diminutive but patent. RIGHT Lower Extremity Inflow: Common, internal and external iliac arteries are patent without evidence of aneurysm, dissection, vasculitis or significant stenosis. Distal external iliac artery assessment limited by artifact from right hip hardware Outflow: Slightly limited assessment of right common femoral artery due to artifact from right hip hardware. Superficial and deep femoral arteries appear patent. Mild to moderate diffuse disease without high-grade  focal stenosis or occlusion. Moderate disease of the popliteal artery without occlusion, aneurysm or high-grade stenosis. Runoff: Assessment of the posterior tibial artery is limited secondary to diffuse calcific disease. Definite patency of the anterior tibial and fibular arteries to the ankle, anterior tibial artery enhances at the dorsum of the foot. LEFT Lower Extremity Inflow: Common, internal and external iliac arteries are patent without evidence of aneurysm, dissection, vasculitis or significant stenosis. Moderate atherosclerosis. Partially obscured left common femoral artery from  artifact arising from left hip hardware. Outflow: Left common femoral and deep femoral arteries appear grossly patent. Moderate focal stenosis of the proximal superficial femoral artery, series 6, image 287. This is about 6 cm distal to the origin of the SFA. Popliteal artery shows no significant stenosis, aneurysm or occlusion. Runoff: Assessment of the posterior tibial artery is limited due to diffuse calcific disease. Definite patency of the anterior tibial and fibular arteries to the ankle, anterior tibial artery enhances at the dorsum of the foot. Probable patency of posterior tibial artery to the level of the ankle. Veins: Suboptimally evaluated Review of the MIP images confirms the above findings. 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 2 suspected areas of pulmonary scarring in the lower lobes and left upper lobe. Cardiomegaly. Hepatobiliary: Cholecystectomy. No focal hepatic abnormality. Extrahepatic biliary dilatation, dilated common bile duct measuring up to 15 mm. Pancreas: Atrophic.  No inflammation Spleen: Possible small splenic calcification as may be seen with granuloma. Adrenals/Urinary Tract: Adrenal glands are normal. Kidneys are atrophic with cortical scarring. No hydronephrosis. Small kidney stones.  Right renal cyst for which no imaging follow-up is recommended. The bladder is obscured by artifact Stomach/Bowel: Gastric bypass changes. No acute bowel wall thickening or evidence for obstruction. Lymphatic: No suspicious lymph nodes Reproductive: Obscured by artifact Other: Negative for pelvic effusion or free air Musculoskeletal: Bilateral hip replacements with artifact. No definite acute osseous abnormality is seen. Multilevel degenerative changes. Mild atrophy of thigh and calf muscles. No focal fluid collections. IMPRESSION: VASCULAR 1. Moderate aortic atherosclerosis without dissection, aneurysm or acute occlusive disease. 2. Diffusely diseased lower extremity vessels. At least moderate focal stenosis of the left superficial femoral artery as above. 3. Limited assessment of the posterior tibial arteries secondary to diffuse calcific disease. Definite 2 vessel patency to the ankle via the anterior tibial and fibular arteries. Anterior tibial arteries enhance at the dorsum of the foot. 4. 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 Areas of scarring in the left base and left upper lobe. Cardiomegaly 2. No definite CT evidence for acute intra-abdominal or pelvic abnormality 3. Cholecystectomy. Extrahepatic biliary dilatation which may be correlated with LFTs 4. Nonobstructing kidney stones Electronically Signed   By: Esmeralda Hedge M.D.   On: 12/21/2023 22:10    Procedures Procedures    Medications Ordered in ED Medications  midodrine  (PROAMATINE ) tablet 10 mg (has no administration in time range)  fludrocortisone  (FLORINEF ) tablet 0.05 mg (has no administration in time range)  gabapentin  (NEURONTIN ) capsule 300 mg (has no administration in time range)  levalbuterol  (XOPENEX ) nebulizer solution 0.63 mg (has no administration in time range)  rivaroxaban   (XARELTO ) tablet 20 mg (has no administration in time range)  pantoprazole  (PROTONIX ) EC tablet 40 mg (has no administration in time range)  sodium chloride  flush (NS) 0.9 % injection 3 mL (has no administration in time range)  acetaminophen  (TYLENOL ) tablet 650 mg (has no administration in time range)    Or  acetaminophen  (TYLENOL ) suppository 650 mg (has no administration in time range)  senna-docusate (Senokot-S) tablet 1 tablet (has no administration in time range)  ondansetron  (ZOFRAN ) tablet 4 mg (has no administration in time range)    Or  ondansetron  (ZOFRAN ) injection 4 mg (has no administration in time range)  iohexol  (OMNIPAQUE ) 350 MG/ML injection 100 mL (100 mLs Intravenous  Contrast Given 12/21/23 1824)  sodium chloride  0.9 % bolus 500 mL (500 mLs Intravenous New Bag/Given 12/21/23 2048)    ED Course/ Medical Decision Making/ A&P Clinical Course as of 12/21/23 2352  Mon Dec 21, 2023  2037 Paged cardiology [MT]  2102 Cardiologist Dr Dozier Genre reviewed the case with me - recommending gentle fluid bolus, check dijoxin level, holding IV diuresis, agreed with observation admission overnight.  Cardiology team to consult on patient in the morning.  Patient had MRI heart for amyloidosis performed today as outpatient [MT]  2117 Delta troponin flat [MT]  2256 Admitted to dr opyd [MT]    Clinical Course User Index [MT] Arvilla Birmingham, MD                                 Medical Decision Making Amount and/or Complexity of Data Reviewed Labs: ordered. Radiology: ordered.  Risk Decision regarding hospitalization.   This patient presents to the ED with concern for weakness, dizziness, fatigue, near syncope. This involves an extensive number of treatment options, and is a complaint that carries with it a high risk of complications and morbidity.  The differential diagnosis includes orthostatic hypotension versus medication side effect including digoxin   Co-morbidities that complicate  the patient evaluation: Chronic hypotension, A-fib  Additional history obtained from caretaker at the bedside  External records from outside source obtained and reviewed including recent hospital discharge summary and echocardiogram  I ordered and personally interpreted labs.  The pertinent results include: Delta troponin flat.  BNP 252.  The CMP and CBC largely unremarkable  X-ray of the chest ordered  CT angiogram of the lower extremities was ordered from triage as there was concerned about discoloration of the toes bilaterally.  I suspect this likely related to chronic hypoperfusion given his longstanding hypotension.  However imaging is pending at this time.  I independently visualized and interpreted imaging which showed cardiomegaly with some mild pulmonary edema on x-ray of the chest.  CT angiogram noting poor peripheral flow, likely related to calcifications of the lower extremities; pulmonary calcifications noted I agree with the radiologist interpretation  The patient was maintained on a cardiac monitor.  I personally viewed and interpreted the cardiac monitored which showed an underlying rhythm of: Rate controlled atrial fibrillation  Per my interpretation the patient's ECG shows A-fib without acute ischemic findings  I ordered medication including fluid for hypotension  I have reviewed the patients home medicines and have made adjustments as needed  Test Considered: Doubt acute PE, mesenteric ischemia, no indication for CT angiogram imaging of the chest at this time  I spoke to the cardiology service on the phone, please see ED course.  Full cardiology consulted requested for AM tomorrow.  After the interventions noted above, I reevaluated the patient and found that they have: improved -blood pressure appears to have improved or stabilized, but patient reports continued weakness and fatigue; labile BP at home   Dispostion:  After consideration of the diagnostic results and  the patients response to treatment, I feel that the patent would benefit from medical admission         Final Clinical Impression(s) / ED Diagnoses Final diagnoses:  Near syncope  Hypotension, unspecified hypotension type  Other fatigue    Rx / DC Orders ED Discharge Orders     None         Arvilla Birmingham, MD 12/21/23 2352

## 2023-12-22 DIAGNOSIS — I5032 Chronic diastolic (congestive) heart failure: Secondary | ICD-10-CM | POA: Diagnosis not present

## 2023-12-22 DIAGNOSIS — I951 Orthostatic hypotension: Principal | ICD-10-CM

## 2023-12-22 DIAGNOSIS — R55 Syncope and collapse: Secondary | ICD-10-CM | POA: Diagnosis not present

## 2023-12-22 LAB — BASIC METABOLIC PANEL WITH GFR
Anion gap: 12 (ref 5–15)
BUN: 20 mg/dL (ref 8–23)
CO2: 26 mmol/L (ref 22–32)
Calcium: 9 mg/dL (ref 8.9–10.3)
Chloride: 97 mmol/L — ABNORMAL LOW (ref 98–111)
Creatinine, Ser: 1.42 mg/dL — ABNORMAL HIGH (ref 0.61–1.24)
GFR, Estimated: 51 mL/min — ABNORMAL LOW (ref >60)
Glucose, Bld: 103 mg/dL — ABNORMAL HIGH (ref 70–99)
Potassium: 3.6 mmol/L (ref 3.5–5.1)
Sodium: 135 mmol/L (ref 135–145)

## 2023-12-22 LAB — CBC
HCT: 36.7 % — ABNORMAL LOW (ref 39.0–52.0)
Hemoglobin: 11.8 g/dL — ABNORMAL LOW (ref 13.0–17.0)
MCH: 31.6 pg (ref 26.0–34.0)
MCHC: 32.2 g/dL (ref 30.0–36.0)
MCV: 98.4 fL (ref 80.0–100.0)
Platelets: 151 10*3/uL (ref 150–400)
RBC: 3.73 MIL/uL — ABNORMAL LOW (ref 4.22–5.81)
RDW: 14.2 % (ref 11.5–15.5)
WBC: 7 10*3/uL (ref 4.0–10.5)
nRBC: 0 % (ref 0.0–0.2)

## 2023-12-22 LAB — MRSA NEXT GEN BY PCR, NASAL: MRSA by PCR Next Gen: DETECTED — AB

## 2023-12-22 LAB — MAGNESIUM: Magnesium: 2 mg/dL (ref 1.7–2.4)

## 2023-12-22 LAB — PHOSPHORUS: Phosphorus: 3 mg/dL (ref 2.5–4.6)

## 2023-12-22 MED ORDER — FLUDROCORTISONE ACETATE 0.1 MG PO TABS
0.1000 mg | ORAL_TABLET | Freq: Every day | ORAL | Status: DC
  Administered 2023-12-23 – 2023-12-26 (×4): 0.1 mg via ORAL
  Filled 2023-12-22 (×5): qty 1

## 2023-12-22 MED ORDER — CHLORHEXIDINE GLUCONATE CLOTH 2 % EX PADS
6.0000 | MEDICATED_PAD | Freq: Every day | CUTANEOUS | Status: AC
  Administered 2023-12-22 – 2023-12-26 (×5): 6 via TOPICAL

## 2023-12-22 MED ORDER — POTASSIUM CHLORIDE CRYS ER 20 MEQ PO TBCR
40.0000 meq | EXTENDED_RELEASE_TABLET | Freq: Once | ORAL | Status: AC
  Administered 2023-12-22: 40 meq via ORAL
  Filled 2023-12-22: qty 2

## 2023-12-22 MED ORDER — MIDODRINE HCL 5 MG PO TABS
15.0000 mg | ORAL_TABLET | Freq: Three times a day (TID) | ORAL | Status: DC
  Administered 2023-12-22 – 2023-12-23 (×4): 15 mg via ORAL
  Filled 2023-12-22 (×4): qty 3

## 2023-12-22 MED ORDER — MUPIROCIN 2 % EX OINT
1.0000 | TOPICAL_OINTMENT | Freq: Two times a day (BID) | CUTANEOUS | Status: DC
  Administered 2023-12-22 – 2023-12-26 (×8): 1 via NASAL
  Filled 2023-12-22 (×3): qty 22

## 2023-12-22 MED ORDER — HYDROCODONE-ACETAMINOPHEN 10-325 MG PO TABS
1.0000 | ORAL_TABLET | Freq: Four times a day (QID) | ORAL | Status: DC | PRN
  Administered 2023-12-22 – 2023-12-25 (×4): 1 via ORAL
  Filled 2023-12-22 (×4): qty 1

## 2023-12-22 NOTE — Progress Notes (Signed)
 PROGRESS NOTE    RONNE Cooper  ZOX:[id number] DOB: 14-Sep-1945 DOA: 12/21/2023 PCP: Medicine, [facility]    Brief Narrative:   [name] is a 78 y.o. male with past medical history significant for chronic diastolic congestive heart failure, paroxysmal atrial fibrillation on Xarelto , history of orthostatic hypotension on midodrine /Florinef , anemia of chronic medical disease, OSA on CPAP, history of cirrhosis, morbid obesity who presented to [facility] ED on 12/21/2023 by recommendation of cardiologist from MRI due to dizziness and near syncopal episode.  Patient with multiple hospitalizations over the last several months for similar episodes with ongoing orthostatic hypotension and near syncope.  Recently seen in outpatient cardiology office with concern of possible amyloidosis with cardiac MRI ordered.  After obtaining MRI, patient with difficulty getting off the table and was subsequently sent to the ED for further evaluation.  In the ED, temperature 98.1 F, HR 80, RR 17, BP 118/73, SpO2 99% on 2 L nasal cannula.  WBC 7.7, hemoglobin 14.2, platelet count 185.  Sodium 137, potassium 4.1, chloride 96, CO2 26, glucose 128, BUN 22, Cram 1.47.  AST 31, ALT 18, total bilirubin 1.0.  BNP 252.2.  High sensitive troponin 41> 39.  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  Cardiology was consulted.  Patient given 500 mL bolus of NS.  TRH consulted for admission for  further evaluation management of recurrent weakness, presyncope concerning for likely history of underlying chronic orthostatic hypotension.  Assessment & Plan:   Near syncopal episode Hx orthostatic hypotension Patient presenting to the ED by direction of cardiology for near syncopal episode, weakness while performing cardiac MRI yesterday.  Patient with multiple hospitalizations over the last 6 months for similar episodes.  Recently seen by cardiology in the outpatient office with concerns for possible amyloidosis and underwent cardiac MRI on 12/21/2023 that was negative.  Also no valvular issues noted on MRI.  Patient has known history of atrial fibrillation that is well-controlled and no other arrhythmias identified.  Seen by cardiology, Dr. Maximo Spar; with recommendation of up titration of midodrine  and fludrocortisone . -- Midodrine  increased to 50 mg p.o. twice daily -- Fludrocortisone  increased to 0.1 mg p.o. daily -- Hold home Lasix  -- Repeat orthostatic vital signs in the a.m. -- PT/OT evaluation -- Cardiology signed off, recommend outpatient follow-up with Dr. Avanell Bob  Chronic diastolic congestive heart failure, compensated Recent TTE 11/08/2023 with LVEF 55%, LV with normal function, no regional wall motion normalities, indeterminate diastolic parameters, no aortic valve stenosis, IVC normal in size. -- Outpatient follow-up with cardiology  Paroxysmal atrial fibrillation -- Xarelto  20 mg p.o. daily  Cirrhosis, compensated -- Continue to hold home diuretic  Neuropathy -- Gabapentin  300 mg p.o. twice daily  Chronic hypoxic respiratory failure OSA --On 2 L nasal cannula at baseline -- Continue nocturnal CPAP  Morbid obesity, class III Body mass index is 52.93 kg/m.   DVT prophylaxis:  rivaroxaban  (XARELTO ) tablet 20 mg    Code Status: Do not attempt resuscitation (DNR) PRE-ARREST INTERVENTIONS DESIRED Family Communication: No family present at bedside this morning  Disposition  Plan:  Level of care: Telemetry Cardiac Status is: Observation The patient  remains OBS appropriate and will d/c before 2 midnights.    Consultants:  cardiology: Signed off 4/22  Procedures:  None  Antimicrobials:  none    Subjective: Patient seen examined bedside, lying in bed.  Remains in ED holding area.  Complaining of back/buttock pain due to the ED stretcher.  Requesting restart of his home pain medication.  Seen by cardiology this morning; cardiac MRI negative for amyloidosis or other significant findings.  Midodrine  and fludrocortisone  have been increased but likely will take several weeks to take effect with recommendation of outpatient follow-up.  Pending PT/OT evaluation.  No other [facility] questions, concerns or complaints at this time.  Denies headache, no visual changes, no chest pain, no palpitations, no shortness of breath, no abdominal pain, no fever/chills/night sweats, no nausea/vomiting/diarrhea, no focal weakness, no fatigue, no paresthesias.  No acute events overnight per nursing.  Objective: Vitals:   12/22/23 1015 12/22/23 1215 12/22/23 1358 12/22/23 1500  BP: 136/72 (!) 109/59  125/63  Pulse: 80 69  82  Resp: (!) 25 16  11   Temp:   98.5 F (36.9 C) 98.1 F (36.7 C)  TempSrc:   Oral Oral  SpO2: 100% 98%  94%  Weight:      Height:        Intake/Output Summary (Last 24 hours) at 12/22/2023 1754 Last data filed at 12/22/2023 0047 Gross per 24 hour  Intake 500 ml  Output --  Net 500 ml   Filed Weights   12/21/23 1547  Weight: (!) 157.9 kg    Examination:  Physical Exam: GEN: NAD, alert and oriented x 3, morbidly obese HEENT: NCAT, PERRL, EOMI, sclera clear, MMM PULM: CTAB w/o wheezes/crackles, normal respiratory effort, on 2 L nasal cannula which is his baseline CV: RRR w/o M/G/R GI: abd soft, NTND, NABS, no R/G/M MSK: Trace bilateral lower extremity peripheral edema, moves all EXTR independently NEURO: CN II-XII intact, no focal deficits,  sensation to light touch intact PSYCH: normal mood/affect Integumentary: No concerning rashes/lesions/wounds noted on exposed skin surfaces    Data Reviewed: I have personally reviewed following labs and imaging studies  CBC: Recent Labs  Lab 12/21/23 1600 12/22/23 0445  WBC 7.7 7.0  NEUTROABS 6.1  --   HGB 14.2 11.8*  HCT 43.7 36.7*  MCV 98.9 98.4  PLT 185 151   Basic Metabolic Panel: Recent Labs  Lab 12/21/23 1600 12/22/23 0445  NA 137 135  K 4.1 3.6  CL 96* 97*  CO2 26 26  GLUCOSE 128* 103*  BUN 22 20  CREATININE 1.47* 1.42*  CALCIUM  9.7 9.0  MG 2.1 2.0  PHOS  --  3.0   GFR: Estimated Creatinine Clearance: 64.2 mL/min (A) (by C-G formula based on SCr of 1.42 mg/dL (H)). Liver Function Tests: Recent Labs  Lab 12/21/23 1600  AST 31  ALT 18  ALKPHOS 86  BILITOT 1.0  PROT 7.6  ALBUMIN  3.5   No results for input(s): "LIPASE", "AMYLASE" in the last 168 hours. No results for input(s): "AMMONIA" in the last 168 hours. Coagulation Profile: No results for input(s): "INR", "PROTIME" in the last 168 hours. Cardiac Enzymes: No results for input(s): "CKTOTAL", "CKMB", "CKMBINDEX", "TROPONINI" in the last 168 hours. BNP (last 3 results) No results for input(s): "PROBNP" in the last 8760 hours. HbA1C: No results for input(s): "HGBA1C" in the last 72 hours. CBG: No results for input(s): "GLUCAP" in the last 168 hours. Lipid Profile: No results for input(s): "CHOL", "HDL", "LDLCALC", "TRIG", "CHOLHDL", "LDLDIRECT" in  the last 72 hours. Thyroid  Function Tests: No results for input(s): "TSH", "T4TOTAL", "FREET4", "T3FREE", "THYROIDAB" in the last 72 hours. Anemia Panel: No results for input(s): "VITAMINB12", "FOLATE", "FERRITIN", "TIBC", "IRON", "RETICCTPCT" in the last 72 hours. Sepsis Labs: Recent Labs  Lab 12/21/23 2028 12/21/23 2151  LATICACIDVEN 2.2* 1.7    No results found for this or any previous visit (from the past 240 hours).       Radiology  Studies: MR CARDIAC MORPHOLOGY W WO CONTRAST Result Date: 12/22/2023 CLINICAL DATA:  Cardiac amyloidosis suspected, further testing rule out Amyloidosis COMPARISON: Echo 11/07/23 EXAM: MR CARDIA MORPHOLOGY WITHOUT AND WITH CONTRAST; MR CARDIAC VELOCITY FLOW MAPPING TECHNIQUE: The patient was scanned on a 1.5 Tesla Siemens magnet. A dedicated cardiac coil was used. Functional imaging was done using TrueFisp sequences. 2,3, and 4 chamber views were done to assess for RWMA's. Modified Simpson's rule using a short axis stack was used to calculate an ejection fraction on a dedicated work [employer]. The patient received 10mL GADAVIST  GADOBUTROL  1 MMOL/ML IV SOLN. After 10 minutes inversion recovery sequences were used to assess for infiltration and scar tissue. Phase contrast velocity encoded images obtained x 2. This examination is tailored for evaluation cardiac anatomy and function and provides very limited assessment of noncardiac structures, which are accordingly not evaluated during interpretation. If there is clinical concern for extracardiac pathology, further evaluation with CT imaging should be considered. FINDINGS: LEFT VENTRICLE: Left ventricular chamber size: Normal. Left ventricular wall thickness: Moderately increased, concentric. Maximal wall thickness 15 mm in the base. Normal left ventricular systolic function. LVEF = 59% There are no regional wall motion abnormalities. No myocardial edema, T2 = 51 msec Normal first pass perfusion. There is no post contrast delayed myocardial enhancement. Normal T1 myocardial nulling kinetics suggest against a diagnosis of cardiac amyloidosis. ECV = 32%, nonspecific elevation. RIGHT VENTRICLE: Normal right ventricular chamber size. Increased right ventricular wall thickness. Normal right ventricular systolic function. RVEF = 55% There are no regional wall motion abnormalities. No post contrast delayed myocardial enhancement. ATRIA: Left atrium: Mildly  dilated Right atrium: Normal size Aorta: Sinus of Valsalva likely upper normal range for age and BSA, 37 x 39 x 39 mm sinus to sinus technique. PERICARDIUM: Normal pericardium.  No pericardial effusion. OTHER: Bilateral pleural effusions with fluid in the fissures. Dilated IVC MEASUREMENTS: Qp/Qs: 0.87 VALVES: Aortic valve regurgitation: trivial, regurgitant fraction 3% Pulmonary valve regurgitation: Trivial, regurgitant fraction 2% Mitral valve regurgitation: Trivial, regurgitant fraction 6% Tricuspid valve regurgitation: Mild, regurgitant fraction 14% Left ventricle: LV male LV EF: 59% (normal 49-79%) Absolute volumes: LV EDV: (Normal 95-215 mL) LV ESV: 44mL (Normal 25-85 mL) LV SV: 63mL (Normal 61-145 mL) CO: 4.4L/min (Normal 3.4-7.8 L/min) Indexed volumes: CI: 1.6L/min/sq-m (Normal 1.8-4.2 L/min/sq-m) LV EDV: 46mL/sq-m (Normal 50-108 mL/sq-m) LV ESV: 33mL/sq-m (Normal 11-47 mL/sq-m) LV SV: 49mL/sq-m (Normal 33-72 mL/sq-m) Right ventricle: RV male RV EF:  55% (normal 51-80%) Absolute volumes: RV EDV: (Normal 109-217 mL) RV ESV: 49mL (Normal 23-91 mL) RV SV: 59mL (Normal 71-141 mL) CO: 4.2L/min (Normal 2.8-8.8 L/min) Indexed volumes: CI: 1.52L/min/sq-m (Normal 1.7-4.2 L/min/sq-m) RV EDV: 50mL/sq-m (Normal 58-109 mL/sq-m) RV ESV: 70mL/sq-m (Normal 12-46 mL/sq-m) RV SV: 67mL/sq-m (Normal 38-71 mL/sq-m) IMPRESSION: 1. Normal left chamber size and function, LVEF 59% with moderate concentric LVH. No delayed myocardial enhancement. No myocardial edema. Normal T1 myocardial nulling. Findings do not suggest an infiltrative process. 2.  Normal right ventricular chamber size and function, RVEF 55%. 3.  No  hemodynamically significant valvular heart disease. Electronically Signed   By: Grady Lawman M.D.   On: 12/22/2023 11:25   MR CARDIAC VELOCITY FLOW MAP Result Date: 12/22/2023 CLINICAL DATA:  Cardiac amyloidosis suspected, further testing rule out Amyloidosis COMPARISON: Echo 11/07/23 EXAM: MR CARDIA  MORPHOLOGY WITHOUT AND WITH CONTRAST; MR CARDIAC VELOCITY FLOW MAPPING TECHNIQUE: The patient was scanned on a 1.5 Tesla Siemens magnet. A dedicated cardiac coil was used. Functional imaging was done using TrueFisp sequences. 2,3, and 4 chamber views were done to assess for RWMA's. Modified Simpson's rule using a short axis stack was used to calculate an ejection fraction on a dedicated work [employer], [employer]. The patient received 10mL GADAVIST  GADOBUTROL  1 MMOL/ML IV SOLN. After 10 minutes inversion recovery sequences were used to assess for infiltration and scar tissue. Phase contrast velocity encoded images obtained x 2. This examination is tailored for evaluation cardiac anatomy and function and provides very limited assessment of noncardiac structures, which are accordingly not evaluated during interpretation. If there is clinical concern for extracardiac pathology, further evaluation with CT imaging should be considered. FINDINGS: LEFT VENTRICLE: Left ventricular chamber size: Normal. Left ventricular wall thickness: Moderately increased, concentric. Maximal wall thickness 15 mm in the base. Normal left ventricular systolic function. LVEF = 59% There are no regional wall motion abnormalities. No myocardial edema, T2 = 51 msec Normal first pass perfusion. There is no post contrast delayed myocardial enhancement. Normal T1 myocardial nulling kinetics suggest against a diagnosis of cardiac amyloidosis. ECV = 32%, nonspecific elevation. RIGHT VENTRICLE: Normal right ventricular chamber size. Increased right ventricular wall thickness. Normal right ventricular systolic function. RVEF = 55% There are no regional wall motion abnormalities. No post contrast delayed myocardial enhancement. ATRIA: Left atrium: Mildly dilated Right atrium: Normal size Aorta: Sinus of Valsalva likely upper normal range for age and BSA, 37 x 39 x 39 mm sinus to sinus technique. PERICARDIUM: Normal pericardium.  No pericardial  effusion. OTHER: Bilateral pleural effusions with fluid in the fissures. Dilated IVC MEASUREMENTS: Qp/Qs: 0.87 VALVES: Aortic valve regurgitation: trivial, regurgitant fraction 3% Pulmonary valve regurgitation: Trivial, regurgitant fraction 2% Mitral valve regurgitation: Trivial, regurgitant fraction 6% Tricuspid valve regurgitation: Mild, regurgitant fraction 14% Left ventricle: LV male LV EF: 59% (normal 49-79%) Absolute volumes: LV EDV: (Normal 95-215 mL) LV ESV: 44mL (Normal 25-85 mL) LV SV: 63mL (Normal 61-145 mL) CO: 4.4L/min (Normal 3.4-7.8 L/min) Indexed volumes: CI: 1.6L/min/sq-m (Normal 1.8-4.2 L/min/sq-m) LV EDV: 73mL/sq-m (Normal 50-108 mL/sq-m) LV ESV: 28mL/sq-m (Normal 11-47 mL/sq-m) LV SV: 67mL/sq-m (Normal 33-72 mL/sq-m) Right ventricle: RV male RV EF:  55% (normal 51-80%) Absolute volumes: RV EDV: (Normal 109-217 mL) RV ESV: 49mL (Normal 23-91 mL) RV SV: 59mL (Normal 71-141 mL) CO: 4.2L/min (Normal 2.8-8.8 L/min) Indexed volumes: CI: 1.52L/min/sq-m (Normal 1.7-4.2 L/min/sq-m) RV EDV: 56mL/sq-m (Normal 58-109 mL/sq-m) RV ESV: 71mL/sq-m (Normal 12-46 mL/sq-m) RV SV: 60mL/sq-m (Normal 38-71 mL/sq-m) IMPRESSION: 1. Normal left chamber size and function, LVEF 59% with moderate concentric LVH. No delayed myocardial enhancement. No myocardial edema. Normal T1 myocardial nulling. Findings do not suggest an infiltrative process. 2.  Normal right ventricular chamber size and function, RVEF 55%. 3.  No hemodynamically significant valvular heart disease. Electronically Signed   By: Grady Lawman M.D.   On: 12/22/2023 11:25   MR CARDIAC VELOCITY FLOW MAP Result Date: 12/22/2023 CLINICAL DATA:  Cardiac amyloidosis suspected, further testing rule out Amyloidosis COMPARISON: Echo 11/07/23 EXAM: MR CARDIA MORPHOLOGY WITHOUT AND WITH CONTRAST; MR CARDIAC VELOCITY FLOW MAPPING TECHNIQUE:  The patient was scanned on a 1.5 Tesla Siemens magnet. A dedicated cardiac coil was used. Functional imaging was  done using TrueFisp sequences. 2,3, and 4 chamber views were done to assess for RWMA's. Modified Simpson's rule using a short axis stack was used to calculate an ejection fraction on a dedicated work [employer], [employer]. The patient received 10mL GADAVIST  GADOBUTROL  1 MMOL/ML IV SOLN. After 10 minutes inversion recovery sequences were used to assess for infiltration and scar tissue. Phase contrast velocity encoded images obtained x 2. This examination is tailored for evaluation cardiac anatomy and function and provides very limited assessment of noncardiac structures, which are accordingly not evaluated during interpretation. If there is clinical concern for extracardiac pathology, further evaluation with CT imaging should be considered. FINDINGS: LEFT VENTRICLE: Left ventricular chamber size: Normal. Left ventricular wall thickness: Moderately increased, concentric. Maximal wall thickness 15 mm in the base. Normal left ventricular systolic function. LVEF = 59% There are no regional wall motion abnormalities. No myocardial edema, T2 = 51 msec Normal first pass perfusion. There is no post contrast delayed myocardial enhancement. Normal T1 myocardial nulling kinetics suggest against a diagnosis of cardiac amyloidosis. ECV = 32%, nonspecific elevation. RIGHT VENTRICLE: Normal right ventricular chamber size. Increased right ventricular wall thickness. Normal right ventricular systolic function. RVEF = 55% There are no regional wall motion abnormalities. No post contrast delayed myocardial enhancement. ATRIA: Left atrium: Mildly dilated Right atrium: Normal size Aorta: Sinus of Valsalva likely upper normal range for age and BSA, 37 x 39 x 39 mm sinus to sinus technique. PERICARDIUM: Normal pericardium.  No pericardial effusion. OTHER: Bilateral pleural effusions with fluid in the fissures. Dilated IVC MEASUREMENTS: Qp/Qs: 0.87 VALVES: Aortic valve regurgitation: trivial, regurgitant fraction 3% Pulmonary valve  regurgitation: Trivial, regurgitant fraction 2% Mitral valve regurgitation: Trivial, regurgitant fraction 6% Tricuspid valve regurgitation: Mild, regurgitant fraction 14% Left ventricle: LV male LV EF: 59% (normal 49-79%) Absolute volumes: LV EDV: (Normal 95-215 mL) LV ESV: 44mL (Normal 25-85 mL) LV SV: 63mL (Normal 61-145 mL) CO: 4.4L/min (Normal 3.4-7.8 L/min) Indexed volumes: CI: 1.6L/min/sq-m (Normal 1.8-4.2 L/min/sq-m) LV EDV: 58mL/sq-m (Normal 50-108 mL/sq-m) LV ESV: 87mL/sq-m (Normal 11-47 mL/sq-m) LV SV: 68mL/sq-m (Normal 33-72 mL/sq-m) Right ventricle: RV male RV EF:  55% (normal 51-80%) Absolute volumes: RV EDV: (Normal 109-217 mL) RV ESV: 49mL (Normal 23-91 mL) RV SV: 59mL (Normal 71-141 mL) CO: 4.2L/min (Normal 2.8-8.8 L/min) Indexed volumes: CI: 1.52L/min/sq-m (Normal 1.7-4.2 L/min/sq-m) RV EDV: 19mL/sq-m (Normal 58-109 mL/sq-m) RV ESV: 29mL/sq-m (Normal 12-46 mL/sq-m) RV SV: 49mL/sq-m (Normal 38-71 mL/sq-m) IMPRESSION: 1. Normal left chamber size and function, LVEF 59% with moderate concentric LVH. No delayed myocardial enhancement. No myocardial edema. Normal T1 myocardial nulling. Findings do not suggest an infiltrative process. 2.  Normal right ventricular chamber size and function, RVEF 55%. 3.  No hemodynamically significant valvular heart disease. Electronically Signed   By: Grady Lawman M.D.   On: 12/22/2023 11:25   DG Chest Portable 1 View Result Date: 12/21/2023 CLINICAL DATA:  Hypoxia short of breath EXAM: PORTABLE CHEST 1 VIEW COMPARISON:  11/05/2023, 10/07/2018 FINDINGS: Mild cardiomegaly with aortic atherosclerosis. Chronic pleural changes and areas of scarring. No acute confluent airspace disease or pneumothorax. IMPRESSION: No active disease. Mild cardiomegaly. Chronic pleural changes and areas of scarring. Electronically Signed   By: Esmeralda Hedge M.D.   On: 12/21/2023 22:11   CT ANGIO LOWER EXT BILAT W &/OR WO CONTRAST Result Date: 12/21/2023 CLINICAL DATA:   Bilateral toe discoloration EXAM:  CT ANGIOGRAPHY OF ABDOMINAL AORTA WITH ILIOFEMORAL RUNOFF TECHNIQUE: Multidetector CT imaging of the abdomen, pelvis and lower extremities was performed using the standard protocol during bolus administration of intravenous contrast. Multiplanar CT image reconstructions and MIPs were obtained to evaluate the vascular anatomy. RADIATION DOSE REDUCTION: This exam was performed according to the departmental dose-optimization program which includes automated exposure control, adjustment of the mA and/or kV according to patient size and/or use of iterative reconstruction technique. CONTRAST:  OMNIPAQUE  IOHEXOL  350 MG/ML SOLN COMPARISON:  Chest CT 10/07/2018 FINDINGS: VASCULAR Aorta: Normal caliber aorta without aneurysm, dissection, vasculitis or significant stenosis. Moderate aortic atherosclerosis. Celiac: Mild stenosis of the proximal celiac artery but widely patent distal branches. No dissection, aneurysm or occlusion SMA: At least moderate focal stenosis of the proximal SMA about a cm distal to the origin. Distal vascular patency. No aneurysm or occlusion Renals: Single right and single left renal arteries. Calcified origins limits assessment for stenosis. No high-grade stenosis is seen. Suspect mild origin stenosis. IMA: Diminutive but patent. RIGHT Lower Extremity Inflow: Common, internal and external iliac arteries are patent without evidence of aneurysm, dissection, vasculitis or significant stenosis. Distal external iliac artery assessment limited by artifact from right hip hardware Outflow: Slightly limited assessment of right common femoral artery due to artifact from right hip hardware. Superficial and deep femoral arteries appear patent. Mild to moderate diffuse disease without high-grade focal stenosis or occlusion. Moderate disease of the popliteal artery without occlusion, aneurysm or high-grade stenosis. Runoff: Assessment of the posterior tibial artery is limited  secondary to diffuse calcific disease. Definite patency of the anterior tibial and fibular arteries to the ankle, anterior tibial artery enhances at the dorsum of the foot. LEFT Lower Extremity Inflow: Common, internal and external iliac arteries are patent without evidence of aneurysm, dissection, vasculitis or significant stenosis. Moderate atherosclerosis. Partially obscured left common femoral artery from artifact arising from left hip hardware. Outflow: Left common femoral and deep femoral arteries appear grossly patent. Moderate focal stenosis of the proximal superficial femoral artery, series 6, image 287. This is about 6 cm distal to the origin of the SFA. Popliteal artery shows no significant stenosis, aneurysm or occlusion. Runoff: Assessment of the posterior tibial artery is limited due to diffuse calcific disease. Definite patency of the anterior tibial and fibular arteries to the ankle, anterior tibial artery enhances at the dorsum of the foot. Probable patency of posterior tibial artery to the level of the ankle. Veins: Suboptimally evaluated Review of the MIP images confirms the above findings. 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 2 suspected areas of pulmonary scarring in the lower lobes and left upper lobe. Cardiomegaly. Hepatobiliary: Cholecystectomy. No focal hepatic abnormality. Extrahepatic biliary dilatation, dilated common bile duct measuring up to 15 mm. Pancreas: Atrophic.  No inflammation Spleen: Possible small splenic calcification as may be seen with granuloma. Adrenals/Urinary Tract: Adrenal glands are normal. Kidneys are atrophic with cortical scarring. No hydronephrosis. Small kidney stones. Right renal cyst for which no imaging follow-up is recommended. The bladder is obscured by artifact Stomach/Bowel: Gastric bypass changes. No acute bowel wall thickening or evidence  for obstruction. Lymphatic: No suspicious lymph nodes Reproductive: Obscured by artifact Other: Negative for pelvic effusion or free air Musculoskeletal: Bilateral hip replacements with artifact. No definite acute osseous abnormality is seen. Multilevel degenerative changes. Mild atrophy of thigh and calf muscles. No focal fluid collections. IMPRESSION: VASCULAR 1. Moderate aortic atherosclerosis  without dissection, aneurysm or acute occlusive disease. 2. Diffusely diseased lower extremity vessels. At least moderate focal stenosis of the left superficial femoral artery as above. 3. Limited assessment of the posterior tibial arteries secondary to diffuse calcific disease. Definite 2 vessel patency to the ankle via the anterior tibial and fibular arteries. Anterior tibial arteries enhance at the dorsum of the foot. 4. 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 Areas of scarring in the left base and left upper lobe. Cardiomegaly 2. No definite CT evidence for acute intra-abdominal or pelvic abnormality 3. Cholecystectomy. Extrahepatic biliary dilatation which may be correlated with LFTs 4. Nonobstructing kidney stones Electronically Signed   By: Esmeralda Hedge M.D.   On: 12/21/2023 22:10        Scheduled Meds:  fludrocortisone   0.1 mg Oral Daily   gabapentin   300 mg Oral BID   midodrine   15 mg Oral TID WC   pantoprazole   40 mg Oral Daily   rivaroxaban   20 mg Oral QPM   sodium chloride  flush  3 mL Intravenous Q12H   Continuous Infusions:   LOS: 0 days    Time spent: 52 minutes spent on 12/22/2023 caring for this patient face-to-face including chart review, ordering labs/tests, documenting, discussion with nursing staff, consultants, updating family and interview/physical exam    [facility] [place], DO [facility] Available via Epic secure chat  7am-7pm After these hours, please refer to coverage provider listed on [email or web address] 12/22/2023, 5:54 PM

## 2023-12-22 NOTE — Consult Note (Signed)
 Cardiology Consultation   Patient ID: [name] MRN: [id number]; DOB: 06-Jul-1946  Admit date: 12/21/2023 Date of Consult: 12/22/2023  PCP:  Medicine, [facility]   [facility] Providers Cardiologist:  Frank Berger, MD   {  Patient Profile:   [name] is a 78 y.o. male with a hx of A. Fib on Xarelto , chronic HFpEF, chronic orthostatic hypotension, OSA on CPAP, cirrhosis, morbid obesity s/p Roux-en-Y bypass, GERD, chronic anemia who is being seen 12/22/2023 for the evaluation of dizziness, near syncope at the request of [name].  History of Present Illness:   [name] with past medical history as above. He presented to the ED on 12/21/2023 for near syncope, weakness, dizziness. He was recently hospitalized from 3/6-3/06/2024 for generalized weakness, near syncope, orthostatic hypotension. It appears that since being discharged from the hospital, he has overall not been doing great. During the prior hospitalization he was given fluids, Lasix  was held, and he was on midodrine  20 mg TID. He reports extreme weakness where he feels unable to move his arms or legs.    He underwent cardiac MRI on 4/21 for cardiac amyloidosis workup, results are still pending from this imaging.  Relevant workup in the ED/since admission: CMP showed elevated creatinine at 1.47 (1.08 a month prior), elevated BNP at 252, CBC stable, troponin flat (41 > 39), CT Angio of LE showed: moderate aortic atherosclerosis, no dissection, diffuse lower extremity vessel disease, moderate focal stenosis of L superficial femoral artery, elevated lactic acid at 2.2 then cleared to 1.7.  The patient was admitted to medicine service with cardiology asked to consult.   Patient was last seen in office by Frank Connor, NP on 11/17/2023 for post-hospital follow up. It was reported at this visit that he has worsening instability and difficulty walking, saying this was the most unstable he has ever been. He  reported persistent nausea, especially after eating. At this visit they discussed proceeding with cardiac MRI for further amyloidosis workup. He was started on fludrocortisone  0.05 mg daily, midodrine  was reduced to 10 mg TID, advised to increase salt intake and continue compression stockings and abdominal binder. In regards his permanent A. Fib he was rate-controlled with digoxin  0.25 mg, anticoagulated on Xarelto  20 mg daily. In regards to HFpEF, patient was noted to be euvolemic at this visit, and it was recommended to continue to hold Lasix  in the setting of orthostatic hypotension.   After speaking with patient, he agrees with the history as stated above.He states that he believes at the beginning of this year he states that he believes he began feeling poorly at the beginning of this year.  He denies any recent sicknesses, known sick contacts.  He states that recently he has had a decreased appetite which has been going on for a couple of months.  He notes that this could be making his symptoms worse.  He agrees to taking his fludrocortisone  and midodrine  as directed.  He has not taken his Lasix  since being told to discontinue back in March.  He says that he speaks with his pharmacist in terms of side effects regarding his multiple medications.  He notes that they tell him that almost all of his medications can cause dizziness, therefore he is unsure if that is playing a part as well.  He currently states he does not feel well.  He is able to ambulate minimally at home with a walker.  He admits that he becomes extremely short  of breath with ambulation a few and just short distances.  His main complaints include feeling dizzy, short of breath, fatigued, extremely weak.  He expresses concern that he was told around 4 AM earlier this morning that he was going to be placed in a different bed and is unable to get any sleep in the current bed that he is in.  Denies any current chest pain.  Currently on 2 L of  oxygen via nasal cannula.  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   Past Surgical History:  Procedure Laterality Date   APPENDECTOMY     ARTERY BIOPSY Left 12/30/2017   Procedure: BIOPSY TEMPORAL ARTERY;  Surgeon: Frank Byes, MD;  Location: [facility];  Service: General;  Laterality: Left;   CHOLECYSTECTOMY     COLONOSCOPY W/ POLYPECTOMY     ESOPHAGOGASTRODUODENOSCOPY (EGD) WITH PROPOFOL   07/06/2012   Procedure: ESOPHAGOGASTRODUODENOSCOPY (EGD) WITH PROPOFOL ;  Surgeon: Frank Solomon, MD;  Location: [facility];  Service: Endoscopy;  Laterality: N/A;   ESOPHAGOSCOPY     EYE SURGERY     left eye- muscle repair   HERNIA REPAIR     ventral hernia   INCISION AND DRAINAGE HIP Left 03/17/2018   Procedure: IRRIGATION AND DEBRIDEMENT LEFT THIGH WOUND;  Surgeon: Frank Rei, MD;  Location: [facility];  Service: Orthopedics;  Laterality: Left;   JOINT REPLACEMENT     bilateral hips.  Right broke and had to be re placed again   MASS EXCISION N/A 03/02/2015   Procedure: EXCISION ABDOMINAL WALL MASS;  Surgeon: Frank Norlander, MD;  Location: [facility] OR;  Service: General;  Laterality: N/A;   TOTAL HIP REVISION Left 02/17/2018   Procedure: Left total hip arthroplasty revision;  Surgeon: Frank Rei, MD;  Location: [facility];  Service: Orthopedics;  Laterality: Left;   VERTICAL BANDED GASTROPLASTY      Home Medications:  Prior to Admission medications   Medication Sig Start Date End Date Taking? Authorizing Provider  acetaminophen  (TYLENOL ) 500 MG tablet Take 1,000 mg by mouth as needed for mild pain (pain score 1-3) or headache.   Yes [provider]  alum & mag hydroxide-simeth (MAALOX/MYLANTA) 200-200-20 MG/5ML suspension Take 15 mLs by mouth every 6 (six) hours as needed for indigestion or heartburn.   Yes [provider]  Carboxymethylcellulose Sodium (THERATEARS) 0.25 % SOLN Place 1-2 drops into both eyes as needed (Dry, irritated eyes.).   Yes [provider]  digoxin  (LANOXIN ) 0.25 MG tablet Take 1 tablet (0.25 mg total) by mouth daily. 11/01/23  Yes Justina Oman, MD  Ensure Max Protein (ENSURE MAX PROTEIN) LIQD Take 330 mLs (11 oz total) by mouth 2 (two) times daily. Patient  taking differently: Take 11-22 oz by mouth 2 (two) times daily as needed. 12/07/18  Yes Cala Castleman, MD  fludrocortisone  (FLORINEF ) 0.1 MG tablet Take 0.5 tablets (0.05 mg total) by mouth daily. Patient taking differently: Take 0.1 mg by mouth daily. 11/17/23  Yes Gerald Kitty., NP  HYDROcodone -acetaminophen  (NORCO) 10-325 MG tablet Take 1 tablet by mouth every 6 (six) hours as needed for moderate pain (pain score 4-6).   Yes [provider]  levalbuterol  (XOPENEX  HFA) 45 MCG/ACT inhaler Inhale 2 puffs into the lungs every 6 (six) hours as needed for wheezing or shortness of breath. 10/31/23 10/30/24 Yes Justina Oman, MD  Melatonin 3 MG TABS Take 2 tablets (6 mg total) by mouth at bedtime. 09/11/18  Yes Krishnan, Gokul, MD  midodrine  (PROAMATINE ) 10 MG tablet Take 1 tablet (10 mg total) by mouth 3 (three) times daily with meals. Patient taking differently: Take 20 mg by mouth 2 (two) times daily. 11/17/23  Yes Gerald Kitty., NP  Multiple Vitamins-Minerals (CENTRUM  ADULTS PO) Take 1 tablet by mouth daily.   Yes [provider]  OVER THE COUNTER MEDICATION Take 20 mg by mouth at bedtime. Take one 20mg  tablet by mouth at bedtime.   Yes [provider]  OXYGEN Inhale 2 L into the lungs continuous.   Yes [provider]  pantoprazole  (PROTONIX ) 40 MG tablet Take 1 tablet (40 mg total) by mouth daily. 09/02/18  Yes Hongalgi, Anand D, MD  promethazine  (PHENERGAN ) 25 MG tablet Take 25 mg by mouth every 6 (six) hours as needed for nausea or vomiting. 09/22/23  Yes [provider]  XARELTO  20 MG TABS tablet Take 20 mg by mouth every evening. 09/18/20  Yes [provider]  gabapentin  (NEURONTIN ) 300 MG capsule Take 1 capsule (300 mg total) by mouth 2 (two) times daily. Patient taking differently: Take 600-900 mg by mouth 2 (two) times daily. Take two capsules in the morning and three capsules at bedtime. 11/17/23   Gerald Kitty., NP   Inpatient Medications: Scheduled Meds:  fludrocortisone   0.05 mg Oral Daily   gabapentin   300 mg Oral BID   midodrine   10 mg Oral TID WC   pantoprazole   40 mg Oral Daily   rivaroxaban   20 mg Oral QPM   sodium chloride  flush  3 mL Intravenous Q12H   Continuous Infusions:  PRN Meds: acetaminophen  **OR** acetaminophen , HYDROcodone -acetaminophen , levalbuterol , ondansetron  **OR** ondansetron  (ZOFRAN ) IV, senna-docusate  Allergies:    Allergies  Allergen Reactions   Bee Venom Anaphylaxis and Other (See Comments)    Noted from [place]. honey bee venom   Nickel Rash and Other (See Comments)    "Breaks me out"   Social History:   Social History   Socioeconomic History   Marital status: Divorced    Spouse name: Not on file   Number of children: Not on file   Years of education: Not on file   Highest education level: Not on file  Occupational History   Not on file  Tobacco Use   Smoking status: Former    Current packs/day: 0.00    Average packs/day: 2.0 packs/day for 17.8  years (35.6 ttl pk-yrs)    Types: Cigarettes    Start date: 05/16/1966    Quit date: 03/11/1984    Years since quitting: 39.8   Smokeless tobacco: Never  Vaping Use   Vaping status: Never Used  Substance and Sexual Activity   Alcohol  use: Not Currently  Comment: occasionally a couple of times a year    Drug use: No   Sexual activity: Not on file  Other Topics Concern   Not on file  Social History Narrative   Not on file   Social Drivers of Health   Financial Resource Strain: Low Risk  (09/15/2023)   Received from [facility]   Overall Financial Resource Strain (CARDIA)    Difficulty of Paying Living Expenses: Not hard at all  Food Insecurity: No Food Insecurity (11/06/2023)   Hunger Vital Sign    Worried About Running Out of Food in the Last Year: Never true    Ran Out of Food in the Last Year: Never true  Transportation Needs: No Transportation Needs (11/06/2023)   PRAPARE - Administrator, Civil Service (Medical): No    Lack of Transportation (Non-Medical): No  Physical Activity: Sufficiently Active (01/11/2023)   Received from [facility]   Exercise Vital Sign    Days of Exercise per Week: 7 days    Minutes of Exercise per Session: 60 min  Stress: No Stress Concern Present (10/08/2023)   Received from [facility] - Occupational Stress Questionnaire    Feeling of Stress : Not at all  Social Connections: Moderately Isolated (11/06/2023)   Social Connection and Isolation Panel [NHANES]    Frequency of Communication with Friends and Family: More than three times a week    Frequency of Social Gatherings with Friends and Family: More than three times a week    Attends Religious Services: 1 to 4 times per year    Active Member of [employer] or Organizations: No    Attends Banker Meetings: Never    Marital Status: Divorced  Catering manager Violence: Not At Risk (11/06/2023)   Humiliation, Afraid, Rape, and Kick  questionnaire    Fear of Current or Ex-Partner: No    Emotionally Abused: No    Physically Abused: No    Sexually Abused: No    Family History:   Family History  Problem Relation Age of Onset   Cancer Mother    Alcohol  abuse Father     ROS:  Please see the history of present illness.  All other ROS reviewed and negative.     Physical Exam/Data:   Vitals:   12/22/23 0500 12/22/23 0530 12/22/23 0951 12/22/23 1015  BP: 126/60 121/64 127/67 136/72  Pulse: 84 81 80 80  Resp:  18 (!) 23 (!) 25  Temp:  98.9 F (37.2 C) 98.8 F (37.1 C)   TempSrc:  Oral    SpO2: 99% 98% 100% 100%  Weight:      Height:       Intake/Output Summary (Last 24 hours) at 12/22/2023 1225 Last data filed at 12/22/2023 0047 Gross per 24 hour  Intake 500 ml  Output --  Net 500 ml      12/21/2023    3:47 PM 12/08/2023    1:28 PM 11/17/2023    2:35 PM  Last 3 Weights  Weight (lbs) 348 lb 1.7 oz -- --  Weight (kg) 157.9 kg -- --     Body mass index is 52.93 kg/m.   General: Morbidly obese man, chronically ill-appearing, no acute distress, on 2 L of oxygen via nasal cannula HEENT: normal Neck: Unable to assess JVD due to body habitus Vascular:  Distal pulses 2+ bilaterally Cardiac:  normal S1, S2; RRR; no murmur  Lungs:  clear to auscultation  bilaterally, distant breath sounds, no wheezing, rhonchi or rales  Abd: soft, nontender Ext: no edema Musculoskeletal:  No deformities Skin: warm and dry  Neuro: no focal abnormalities noted  EKG:  The EKG was personally reviewed and demonstrates:  atrial fibrillation, HR 80  Telemetry:  Telemetry was personally reviewed and demonstrates: Atrial fibrillation, heart rate 70s-80s, frequent PVCs  Relevant CV Studies:  Echocardiogram, 11/07/2023 IMPRESSIONS  1. Poor quality images EF grossly normal endocardium not visualized . Left ventricular ejection fraction, by estimation, is 55% . The left ventricle has normal function. The left ventricle has no regional  wall motion abnormalities. There is mild left ventricular hypertrophy. Left ventricular diastolic parameters are indeterminate.  2. Right ventricular systolic function is normal. The right ventricular size is mildly enlarged. Mildly increased right ventricular wall thickness.  3. The mitral valve is normal in structure. No evidence of mitral valve regurgitation. No evidence of mitral stenosis.  4. The aortic valve is tricuspid. There is mild calcification of the aortic valve. There is mild thickening of the aortic valve. Aortic valve regurgitation is not visualized. Aortic valve sclerosis is present, with no evidence of aortic valve stenosis.  5. The inferior vena cava is normal in size with greater than 50% respiratory variability, suggesting right atrial pressure of 3 mmHg.   Cardiac MRI, 12/21/2023 IMPRESSION: 1. Normal left chamber size and function, LVEF 59% with moderate concentric LVH. No delayed myocardial enhancement. No myocardial edema. Normal T1 myocardial nulling. Findings do not suggest an infiltrative process.   2.  Normal right ventricular chamber size and function, RVEF 55%.   3.  No hemodynamically significant valvular heart disease.  Laboratory Data:  High Sensitivity Troponin:   Recent Labs  Lab 12/21/23 1600 12/21/23 2010  TROPONINIHS 41* 39*     Chemistry Recent Labs  Lab 12/21/23 1600 12/22/23 0445  NA 137 135  K 4.1 3.6  CL 96* 97*  CO2 26 26  GLUCOSE 128* 103*  BUN 22 20  CREATININE 1.47* 1.42*  CALCIUM  9.7 9.0  MG 2.1 2.0  GFRNONAA 49* 51*  ANIONGAP 15 12    Recent Labs  Lab 12/21/23 1600  PROT 7.6  ALBUMIN  3.5  AST 31  ALT 18  ALKPHOS 86  BILITOT 1.0   Lipids No results for input(s): "CHOL", "TRIG", "HDL", "LABVLDL", "LDLCALC", "CHOLHDL" in the last 168 hours.  Hematology Recent Labs  Lab 12/21/23 1600 12/22/23 0445  WBC 7.7 7.0  RBC 4.42 3.73*  HGB 14.2 11.8*  HCT 43.7 36.7*  MCV 98.9 98.4  MCH 32.1 31.6  MCHC 32.5 32.2   RDW 13.9 14.2  PLT 185 151   Thyroid  No results for input(s): "TSH", "FREET4" in the last 168 hours.  BNP Recent Labs  Lab 12/21/23 1600  BNP 252.2*    DDimer No results for input(s): "DDIMER" in the last 168 hours.  Radiology/Studies:  MR CARDIAC MORPHOLOGY W WO CONTRAST Result Date: 12/22/2023 CLINICAL DATA:  Cardiac amyloidosis suspected, further testing rule out Amyloidosis COMPARISON: Echo 11/07/23 EXAM: MR CARDIA MORPHOLOGY WITHOUT AND WITH CONTRAST; MR CARDIAC VELOCITY FLOW MAPPING TECHNIQUE: The patient was scanned on a 1.5 Tesla Siemens magnet. A dedicated cardiac coil was used. Functional imaging was done using TrueFisp sequences. 2,3, and 4 chamber views were done to assess for RWMA's. Modified Simpson's rule using a short axis stack was used to calculate an ejection fraction on a dedicated work Research officer, trade union. The patient received 10mL GADAVIST  GADOBUTROL  1 MMOL/ML IV SOLN. After  10 minutes inversion recovery sequences were used to assess for infiltration and scar tissue. Phase contrast velocity encoded images obtained x 2. This examination is tailored for evaluation cardiac anatomy and function and provides very limited assessment of noncardiac structures, which are accordingly not evaluated during interpretation. If there is clinical concern for extracardiac pathology, further evaluation with CT imaging should be considered. FINDINGS: LEFT VENTRICLE: Left ventricular chamber size: Normal. Left ventricular wall thickness: Moderately increased, concentric. Maximal wall thickness 15 mm in the base. Normal left ventricular systolic function. LVEF = 59% There are no regional wall motion abnormalities. No myocardial edema, T2 = 51 msec Normal first pass perfusion. There is no post contrast delayed myocardial enhancement. Normal T1 myocardial nulling kinetics suggest against a diagnosis of cardiac amyloidosis. ECV = 32%, nonspecific elevation. RIGHT VENTRICLE: Normal right  ventricular chamber size. Increased right ventricular wall thickness. Normal right ventricular systolic function. RVEF = 55% There are no regional wall motion abnormalities. No post contrast delayed myocardial enhancement. ATRIA: Left atrium: Mildly dilated Right atrium: Normal size Aorta: Sinus of Valsalva likely upper normal range for age and BSA, 37 x 39 x 39 mm sinus to sinus technique. PERICARDIUM: Normal pericardium.  No pericardial effusion. OTHER: Bilateral pleural effusions with fluid in the fissures. Dilated IVC MEASUREMENTS: Qp/Qs: 0.87 VALVES: Aortic valve regurgitation: trivial, regurgitant fraction 3% Pulmonary valve regurgitation: Trivial, regurgitant fraction 2% Mitral valve regurgitation: Trivial, regurgitant fraction 6% Tricuspid valve regurgitation: Mild, regurgitant fraction 14% Left ventricle: LV male LV EF: 59% (normal 49-79%) Absolute volumes: LV EDV: (Normal 95-215 mL) LV ESV: 44mL (Normal 25-85 mL) LV SV: 63mL (Normal 61-145 mL) CO: 4.4L/min (Normal 3.4-7.8 L/min) Indexed volumes: CI: 1.6L/min/sq-m (Normal 1.8-4.2 L/min/sq-m) LV EDV: 34mL/sq-m (Normal 50-108 mL/sq-m) LV ESV: 64mL/sq-m (Normal 11-47 mL/sq-m) LV SV: 31mL/sq-m (Normal 33-72 mL/sq-m) Right ventricle: RV male RV EF:  55% (normal 51-80%) Absolute volumes: RV EDV: (Normal 109-217 mL) RV ESV: 49mL (Normal 23-91 mL) RV SV: 59mL (Normal 71-141 mL) CO: 4.2L/min (Normal 2.8-8.8 L/min) Indexed volumes: CI: 1.52L/min/sq-m (Normal 1.7-4.2 L/min/sq-m) RV EDV: 31mL/sq-m (Normal 58-109 mL/sq-m) RV ESV: 44mL/sq-m (Normal 12-46 mL/sq-m) RV SV: 34mL/sq-m (Normal 38-71 mL/sq-m) IMPRESSION: 1. Normal left chamber size and function, LVEF 59% with moderate concentric LVH. No delayed myocardial enhancement. No myocardial edema. Normal T1 myocardial nulling. Findings do not suggest an infiltrative process. 2.  Normal right ventricular chamber size and function, RVEF 55%. 3.  No hemodynamically significant valvular heart disease.  Electronically Signed   By: Grady Lawman M.D.   On: 12/22/2023 11:25   MR CARDIAC VELOCITY FLOW MAP Result Date: 12/22/2023 CLINICAL DATA:  Cardiac amyloidosis suspected, further testing rule out Amyloidosis COMPARISON: Echo 11/07/23 EXAM: MR CARDIA MORPHOLOGY WITHOUT AND WITH CONTRAST; MR CARDIAC VELOCITY FLOW MAPPING TECHNIQUE: The patient was scanned on a 1.5 Tesla Siemens magnet. A dedicated cardiac coil was used. Functional imaging was done using TrueFisp sequences. 2,3, and 4 chamber views were done to assess for RWMA's. Modified Simpson's rule using a short axis stack was used to calculate an ejection fraction on a dedicated work Research officer, trade union. The patient received 10mL GADAVIST  GADOBUTROL  1 MMOL/ML IV SOLN. After 10 minutes inversion recovery sequences were used to assess for infiltration and scar tissue. Phase contrast velocity encoded images obtained x 2. This examination is tailored for evaluation cardiac anatomy and function and provides very limited assessment of noncardiac structures, which are accordingly not evaluated during interpretation. If there is clinical concern for extracardiac pathology, further evaluation  with CT imaging should be considered. FINDINGS: LEFT VENTRICLE: Left ventricular chamber size: Normal. Left ventricular wall thickness: Moderately increased, concentric. Maximal wall thickness 15 mm in the base. Normal left ventricular systolic function. LVEF = 59% There are no regional wall motion abnormalities. No myocardial edema, T2 = 51 msec Normal first pass perfusion. There is no post contrast delayed myocardial enhancement. Normal T1 myocardial nulling kinetics suggest against a diagnosis of cardiac amyloidosis. ECV = 32%, nonspecific elevation. RIGHT VENTRICLE: Normal right ventricular chamber size. Increased right ventricular wall thickness. Normal right ventricular systolic function. RVEF = 55% There are no regional wall motion abnormalities. No post  contrast delayed myocardial enhancement. ATRIA: Left atrium: Mildly dilated Right atrium: Normal size Aorta: Sinus of Valsalva likely upper normal range for age and BSA, 37 x 39 x 39 mm sinus to sinus technique. PERICARDIUM: Normal pericardium.  No pericardial effusion. OTHER: Bilateral pleural effusions with fluid in the fissures. Dilated IVC MEASUREMENTS: Qp/Qs: 0.87 VALVES: Aortic valve regurgitation: trivial, regurgitant fraction 3% Pulmonary valve regurgitation: Trivial, regurgitant fraction 2% Mitral valve regurgitation: Trivial, regurgitant fraction 6% Tricuspid valve regurgitation: Mild, regurgitant fraction 14% Left ventricle: LV male LV EF: 59% (normal 49-79%) Absolute volumes: LV EDV: (Normal 95-215 mL) LV ESV: 44mL (Normal 25-85 mL) LV SV: 63mL (Normal 61-145 mL) CO: 4.4L/min (Normal 3.4-7.8 L/min) Indexed volumes: CI: 1.6L/min/sq-m (Normal 1.8-4.2 L/min/sq-m) LV EDV: 67mL/sq-m (Normal 50-108 mL/sq-m) LV ESV: 73mL/sq-m (Normal 11-47 mL/sq-m) LV SV: 8mL/sq-m (Normal 33-72 mL/sq-m) Right ventricle: RV male RV EF:  55% (normal 51-80%) Absolute volumes: RV EDV: (Normal 109-217 mL) RV ESV: 49mL (Normal 23-91 mL) RV SV: 59mL (Normal 71-141 mL) CO: 4.2L/min (Normal 2.8-8.8 L/min) Indexed volumes: CI: 1.52L/min/sq-m (Normal 1.7-4.2 L/min/sq-m) RV EDV: 86mL/sq-m (Normal 58-109 mL/sq-m) RV ESV: 87mL/sq-m (Normal 12-46 mL/sq-m) RV SV: 44mL/sq-m (Normal 38-71 mL/sq-m) IMPRESSION: 1. Normal left chamber size and function, LVEF 59% with moderate concentric LVH. No delayed myocardial enhancement. No myocardial edema. Normal T1 myocardial nulling. Findings do not suggest an infiltrative process. 2.  Normal right ventricular chamber size and function, RVEF 55%. 3.  No hemodynamically significant valvular heart disease. Electronically Signed   By: Grady Lawman M.D.   On: 12/22/2023 11:25   MR CARDIAC VELOCITY FLOW MAP Result Date: 12/22/2023 CLINICAL DATA:  Cardiac amyloidosis suspected, further  testing rule out Amyloidosis COMPARISON: Echo 11/07/23 EXAM: MR CARDIA MORPHOLOGY WITHOUT AND WITH CONTRAST; MR CARDIAC VELOCITY FLOW MAPPING TECHNIQUE: The patient was scanned on a 1.5 Tesla Siemens magnet. A dedicated cardiac coil was used. Functional imaging was done using TrueFisp sequences. 2,3, and 4 chamber views were done to assess for RWMA's. Modified Simpson's rule using a short axis stack was used to calculate an ejection fraction on a dedicated work Research officer, trade union. The patient received 10mL GADAVIST  GADOBUTROL  1 MMOL/ML IV SOLN. After 10 minutes inversion recovery sequences were used to assess for infiltration and scar tissue. Phase contrast velocity encoded images obtained x 2. This examination is tailored for evaluation cardiac anatomy and function and provides very limited assessment of noncardiac structures, which are accordingly not evaluated during interpretation. If there is clinical concern for extracardiac pathology, further evaluation with CT imaging should be considered. FINDINGS: LEFT VENTRICLE: Left ventricular chamber size: Normal. Left ventricular wall thickness: Moderately increased, concentric. Maximal wall thickness 15 mm in the base. Normal left ventricular systolic function. LVEF = 59% There are no regional wall motion abnormalities. No myocardial edema, T2 = 51 msec Normal first pass perfusion. There is  no post contrast delayed myocardial enhancement. Normal T1 myocardial nulling kinetics suggest against a diagnosis of cardiac amyloidosis. ECV = 32%, nonspecific elevation. RIGHT VENTRICLE: Normal right ventricular chamber size. Increased right ventricular wall thickness. Normal right ventricular systolic function. RVEF = 55% There are no regional wall motion abnormalities. No post contrast delayed myocardial enhancement. ATRIA: Left atrium: Mildly dilated Right atrium: Normal size Aorta: Sinus of Valsalva likely upper normal range for age and BSA, 37 x 39 x 39 mm sinus  to sinus technique. PERICARDIUM: Normal pericardium.  No pericardial effusion. OTHER: Bilateral pleural effusions with fluid in the fissures. Dilated IVC MEASUREMENTS: Qp/Qs: 0.87 VALVES: Aortic valve regurgitation: trivial, regurgitant fraction 3% Pulmonary valve regurgitation: Trivial, regurgitant fraction 2% Mitral valve regurgitation: Trivial, regurgitant fraction 6% Tricuspid valve regurgitation: Mild, regurgitant fraction 14% Left ventricle: LV male LV EF: 59% (normal 49-79%) Absolute volumes: LV EDV: (Normal 95-215 mL) LV ESV: 44mL (Normal 25-85 mL) LV SV: 63mL (Normal 61-145 mL) CO: 4.4L/min (Normal 3.4-7.8 L/min) Indexed volumes: CI: 1.6L/min/sq-m (Normal 1.8-4.2 L/min/sq-m) LV EDV: 73mL/sq-m (Normal 50-108 mL/sq-m) LV ESV: 43mL/sq-m (Normal 11-47 mL/sq-m) LV SV: 59mL/sq-m (Normal 33-72 mL/sq-m) Right ventricle: RV male RV EF:  55% (normal 51-80%) Absolute volumes: RV EDV: (Normal 109-217 mL) RV ESV: 49mL (Normal 23-91 mL) RV SV: 59mL (Normal 71-141 mL) CO: 4.2L/min (Normal 2.8-8.8 L/min) Indexed volumes: CI: 1.52L/min/sq-m (Normal 1.7-4.2 L/min/sq-m) RV EDV: 75mL/sq-m (Normal 58-109 mL/sq-m) RV ESV: 71mL/sq-m (Normal 12-46 mL/sq-m) RV SV: 27mL/sq-m (Normal 38-71 mL/sq-m) IMPRESSION: 1. Normal left chamber size and function, LVEF 59% with moderate concentric LVH. No delayed myocardial enhancement. No myocardial edema. Normal T1 myocardial nulling. Findings do not suggest an infiltrative process. 2.  Normal right ventricular chamber size and function, RVEF 55%. 3.  No hemodynamically significant valvular heart disease. Electronically Signed   By: Grady Lawman M.D.   On: 12/22/2023 11:25   DG Chest Portable 1 View Result Date: 12/21/2023 CLINICAL DATA:  Hypoxia short of breath EXAM: PORTABLE CHEST 1 VIEW COMPARISON:  11/05/2023, 10/07/2018 FINDINGS: Mild cardiomegaly with aortic atherosclerosis. Chronic pleural changes and areas of scarring. No acute confluent airspace disease or  pneumothorax. IMPRESSION: No active disease. Mild cardiomegaly. Chronic pleural changes and areas of scarring. Electronically Signed   By: Esmeralda Hedge M.D.   On: 12/21/2023 22:11   CT ANGIO LOWER EXT BILAT W &/OR WO CONTRAST Result Date: 12/21/2023 CLINICAL DATA:  Bilateral toe discoloration EXAM: CT ANGIOGRAPHY OF ABDOMINAL AORTA WITH ILIOFEMORAL RUNOFF TECHNIQUE: Multidetector CT imaging of the abdomen, pelvis and lower extremities was performed using the standard protocol during bolus administration of intravenous contrast. Multiplanar CT image reconstructions and MIPs were obtained to evaluate the vascular anatomy. RADIATION DOSE REDUCTION: This exam was performed according to the departmental dose-optimization program which includes automated exposure control, adjustment of the mA and/or kV according to patient size and/or use of iterative reconstruction technique. CONTRAST:  OMNIPAQUE  IOHEXOL  350 MG/ML SOLN COMPARISON:  Chest CT 10/07/2018 FINDINGS: VASCULAR Aorta: Normal caliber aorta without aneurysm, dissection, vasculitis or significant stenosis. Moderate aortic atherosclerosis. Celiac: Mild stenosis of the proximal celiac artery but widely patent distal branches. No dissection, aneurysm or occlusion SMA: At least moderate focal stenosis of the proximal SMA about a cm distal to the origin. Distal vascular patency. No aneurysm or occlusion Renals: Single right and single left renal arteries. Calcified origins limits assessment for stenosis. No high-grade stenosis is seen. Suspect mild origin stenosis. IMA: Diminutive but patent. RIGHT Lower Extremity Inflow: Common, internal and  external iliac arteries are patent without evidence of aneurysm, dissection, vasculitis or significant stenosis. Distal external iliac artery assessment limited by artifact from right hip hardware Outflow: Slightly limited assessment of right common femoral artery due to artifact from right hip hardware. Superficial and  deep femoral arteries appear patent. Mild to moderate diffuse disease without high-grade focal stenosis or occlusion. Moderate disease of the popliteal artery without occlusion, aneurysm or high-grade stenosis. Runoff: Assessment of the posterior tibial artery is limited secondary to diffuse calcific disease. Definite patency of the anterior tibial and fibular arteries to the ankle, anterior tibial artery enhances at the dorsum of the foot. LEFT Lower Extremity Inflow: Common, internal and external iliac arteries are patent without evidence of aneurysm, dissection, vasculitis or significant stenosis. Moderate atherosclerosis. Partially obscured left common femoral artery from artifact arising from left hip hardware. Outflow: Left common femoral and deep femoral arteries appear grossly patent. Moderate focal stenosis of the proximal superficial femoral artery, series 6, image 287. This is about 6 cm distal to the origin of the SFA. Popliteal artery shows no significant stenosis, aneurysm or occlusion. Runoff: Assessment of the posterior tibial artery is limited due to diffuse calcific disease. Definite patency of the anterior tibial and fibular arteries to the ankle, anterior tibial artery enhances at the dorsum of the foot. Probable patency of posterior tibial artery to the level of the ankle. Veins: Suboptimally evaluated Review of the MIP images confirms the above findings. 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 2 suspected areas of pulmonary scarring in the lower lobes and left upper lobe. Cardiomegaly. Hepatobiliary: Cholecystectomy. No focal hepatic abnormality. Extrahepatic biliary dilatation, dilated common bile duct measuring up to 15 mm. Pancreas: Atrophic.  No inflammation Spleen: Possible small splenic calcification as may be seen with granuloma. Adrenals/Urinary Tract: Adrenal glands are  normal. Kidneys are atrophic with cortical scarring. No hydronephrosis. Small kidney stones. Right renal cyst for which no imaging follow-up is recommended. The bladder is obscured by artifact Stomach/Bowel: Gastric bypass changes. No acute bowel wall thickening or evidence for obstruction. Lymphatic: No suspicious lymph nodes Reproductive: Obscured by artifact Other: Negative for pelvic effusion or free air Musculoskeletal: Bilateral hip replacements with artifact. No definite acute osseous abnormality is seen. Multilevel degenerative changes. Mild atrophy of thigh and calf muscles. No focal fluid collections. IMPRESSION: VASCULAR 1. Moderate aortic atherosclerosis without dissection, aneurysm or acute occlusive disease. 2. Diffusely diseased lower extremity vessels. At least moderate focal stenosis of the left superficial femoral artery as above. 3. Limited assessment of the posterior tibial arteries secondary to diffuse calcific disease. Definite 2 vessel patency to the ankle via the anterior tibial and fibular arteries. Anterior tibial arteries enhance at the dorsum of the foot. 4. 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 Areas of scarring in the left base and left upper lobe. Cardiomegaly 2. No definite CT evidence for acute intra-abdominal or pelvic abnormality 3. Cholecystectomy. Extrahepatic biliary dilatation which may be correlated with LFTs 4. Nonobstructing kidney stones Electronically Signed   By: Esmeralda Hedge M.D.   On: 12/21/2023 22:10   Assessment and Plan:   Chronic orthostatic hypotension Near syncope Lightheadedness Dizziness Patient has known history of chronic orthostatic hypotension  Home meds: fludrocortisone  0.05 mg daily, midodrine  10 mg TID Previously given compression stockings and abdominal binder, educated on increasing salt  intake,  monitoring BP frequently, and sitting/standing 3-4 times per day Presented to ED with worsening symptoms over the past week  Reports that he has had a decreased appetite over the last couple of months, decreasing his oral intake, possibly playing a role in his symptoms Reports using compression stockings at home but was not able to use an abdominal binder Says that he takes his blood pressure frequently at home however it varies significantly moment to moment Most recent BP 136/72 with heart rate 84, lowest BP since arrival 73/57 Currently on fludrocortisone  0.05 mg daily, midodrine  10 mg TID Given 500 cc IV fluids while in the ED Given potassium supplement  40 mEq x 1 Patient reports poor oral intake over the last couple of months due to weakness likely contributing to his symptoms   Chronic HFpEF Echo 10/2023: EF 55%, mild LVH, normal RV function, mildly enlarged RV, normal IVC Cardiac MRI showed EF 59%, moderate LVH, not suggestive of an infiltrative process  BNP elevated at 252 this admission Does not appear volume overloaded on exam  Reports that he has been holding his home Lasix  since March 2025 when instructed to Continue strict I&O's, daily weights, BMPs  Permanent atrial fibrillation Patient has known history of atrial fibrillation  Anticoagulated on Xarelto   Typically rate-controlled on digoxin  as he was not tolerating beta-blockers with his hypotension -- held  Most recent BP 136/72, HR controlled 70-80s Currently on Xarelto  20 mg daily   Per primary Chronic hypoxic respiratory failure OSA on CPAP GERD Cirrhosis  Chronic pain  AKI  Risk Assessment/Risk Scores:       [place]  Heart Association (NYHA) Functional Class NYHA Class III  CHA2DS2-VASc Score = 4   This indicates a 4.8% annual risk of stroke. The patient's score is based upon: CHF History: 1 HTN History: 0 Diabetes History: 0 Stroke History: 0 Vascular Disease History: 1 (aortic plaque) Age Score:  2 Gender Score: 0       For questions or updates, please contact [facility] Please consult [email or web address] for contact info under    Signed, Jiles Mote, PA-C  12/22/2023 12:25 PM

## 2023-12-22 NOTE — Plan of Care (Signed)
   Problem: Clinical Measurements: Goal: Cardiovascular complication will be avoided Outcome: Progressing   Problem: Activity: Goal: Risk for activity intolerance will decrease Outcome: Progressing   Problem: Nutrition: Goal: Adequate nutrition will be maintained Outcome: Progressing   Problem: Safety: Goal: Ability to remain free from injury will improve Outcome: Progressing   Problem: Skin Integrity: Goal: Risk for impaired skin integrity will decrease Outcome: Progressing

## 2023-12-23 ENCOUNTER — Inpatient Hospital Stay (HOSPITAL_COMMUNITY)

## 2023-12-23 ENCOUNTER — Encounter: Payer: Self-pay | Admitting: Internal Medicine

## 2023-12-23 DIAGNOSIS — Z91048 Other nonmedicinal substance allergy status: Secondary | ICD-10-CM | POA: Diagnosis not present

## 2023-12-23 DIAGNOSIS — Z79899 Other long term (current) drug therapy: Secondary | ICD-10-CM | POA: Diagnosis not present

## 2023-12-23 DIAGNOSIS — Z9049 Acquired absence of other specified parts of digestive tract: Secondary | ICD-10-CM | POA: Diagnosis not present

## 2023-12-23 DIAGNOSIS — Z66 Do not resuscitate: Secondary | ICD-10-CM | POA: Diagnosis present

## 2023-12-23 DIAGNOSIS — N1831 Chronic kidney disease, stage 3a: Secondary | ICD-10-CM | POA: Diagnosis present

## 2023-12-23 DIAGNOSIS — G4733 Obstructive sleep apnea (adult) (pediatric): Secondary | ICD-10-CM | POA: Diagnosis present

## 2023-12-23 DIAGNOSIS — Z7901 Long term (current) use of anticoagulants: Secondary | ICD-10-CM | POA: Diagnosis not present

## 2023-12-23 DIAGNOSIS — Z91030 Bee allergy status: Secondary | ICD-10-CM | POA: Diagnosis not present

## 2023-12-23 DIAGNOSIS — E66813 Obesity, class 3: Secondary | ICD-10-CM | POA: Diagnosis present

## 2023-12-23 DIAGNOSIS — D631 Anemia in chronic kidney disease: Secondary | ICD-10-CM | POA: Diagnosis present

## 2023-12-23 DIAGNOSIS — Z6841 Body Mass Index (BMI) 40.0 and over, adult: Secondary | ICD-10-CM | POA: Diagnosis not present

## 2023-12-23 DIAGNOSIS — R55 Syncope and collapse: Secondary | ICD-10-CM | POA: Diagnosis present

## 2023-12-23 DIAGNOSIS — N179 Acute kidney failure, unspecified: Secondary | ICD-10-CM | POA: Diagnosis present

## 2023-12-23 DIAGNOSIS — I5032 Chronic diastolic (congestive) heart failure: Secondary | ICD-10-CM | POA: Diagnosis present

## 2023-12-23 DIAGNOSIS — I951 Orthostatic hypotension: Secondary | ICD-10-CM | POA: Diagnosis present

## 2023-12-23 DIAGNOSIS — Z9884 Bariatric surgery status: Secondary | ICD-10-CM | POA: Diagnosis not present

## 2023-12-23 DIAGNOSIS — J9611 Chronic respiratory failure with hypoxia: Secondary | ICD-10-CM | POA: Diagnosis present

## 2023-12-23 DIAGNOSIS — G8929 Other chronic pain: Secondary | ICD-10-CM | POA: Diagnosis present

## 2023-12-23 DIAGNOSIS — Z87891 Personal history of nicotine dependence: Secondary | ICD-10-CM | POA: Diagnosis not present

## 2023-12-23 DIAGNOSIS — R251 Tremor, unspecified: Secondary | ICD-10-CM | POA: Diagnosis present

## 2023-12-23 DIAGNOSIS — I700 Atherosclerosis of aorta: Secondary | ICD-10-CM | POA: Diagnosis present

## 2023-12-23 DIAGNOSIS — K7469 Other cirrhosis of liver: Secondary | ICD-10-CM | POA: Diagnosis present

## 2023-12-23 DIAGNOSIS — G629 Polyneuropathy, unspecified: Secondary | ICD-10-CM | POA: Diagnosis present

## 2023-12-23 DIAGNOSIS — I4821 Permanent atrial fibrillation: Secondary | ICD-10-CM | POA: Diagnosis present

## 2023-12-23 DIAGNOSIS — D539 Nutritional anemia, unspecified: Secondary | ICD-10-CM | POA: Diagnosis present

## 2023-12-23 LAB — BASIC METABOLIC PANEL WITH GFR
Anion gap: 12 (ref 5–15)
BUN: 24 mg/dL — ABNORMAL HIGH (ref 8–23)
CO2: 23 mmol/L (ref 22–32)
Calcium: 8.7 mg/dL — ABNORMAL LOW (ref 8.9–10.3)
Chloride: 102 mmol/L (ref 98–111)
Creatinine, Ser: 1.61 mg/dL — ABNORMAL HIGH (ref 0.61–1.24)
GFR, Estimated: 44 mL/min — ABNORMAL LOW (ref >60)
Glucose, Bld: 124 mg/dL — ABNORMAL HIGH (ref 70–99)
Potassium: 4.8 mmol/L (ref 3.5–5.1)
Sodium: 137 mmol/L (ref 135–145)

## 2023-12-23 LAB — MAGNESIUM: Magnesium: 2.3 mg/dL (ref 1.7–2.4)

## 2023-12-23 MED ORDER — SODIUM CHLORIDE 0.9 % IV SOLN: INTRAVENOUS | Status: DC

## 2023-12-23 MED ORDER — GADOBUTROL 1 MMOL/ML IV SOLN
10.0000 mL | Freq: Once | INTRAVENOUS | Status: AC | PRN
  Administered 2023-12-23: 10 mL via INTRAVENOUS

## 2023-12-23 NOTE — Evaluation (Signed)
 Physical Therapy Evaluation Patient Details Name: [name] MRN: [id number] DOB: [birth year] Today's Date: 12/23/2023  History of Present Illness  78 y.o. male presents to [facility] 12/21/23 with syncope and lethargy. Admitted with near syncopal episode. Multiple prior admits for similar episodes. PMHx: cirrhosis, atrial fibrillation on Xarelto , chronic HFpEF, chronic orthostatic hypotension, chronic anemia   Clinical Impression  Pt in bed upon arrival and agreeable to PT eval. PTA, pt would ambulate short distances with a RW only when he had his PCA or friend close by. In today's session, pt was able to perform bed mobility with ModAx2 for safety. He was then able to stand and perform step-pivot with ModAx2 and use of RW. Pt reported feeling lightheaded/dizzy with further gait deferred. Pt currently with functional limitations due to the deficits listed below (see PT Problem List). Pt would benefit from acute skilled PT to address functional impairments. Recommending post-acute rehab <3hrs to work towards independence with mobility. Acute PT to follow.  SpO2 92-95% on RA, HR 80-118 BPM during session       12/23/23 6295  Orthostatic Lying   BP- Lying 106/52  Pulse- Lying 90  Orthostatic Sitting  BP- Sitting 114/69  Pulse- Sitting 101  Orthostatic Standing at 0 minutes  BP- Standing at 0 minutes (!) 88/71  Pulse- Standing at 0 minutes 116      If plan is discharge home, recommend the following: A lot of help with walking and/or transfers;A lot of help with bathing/dressing/bathroom;Assistance with cooking/housework;Assist for transportation;Help with stairs or ramp for entrance   Can travel by private vehicle   No    Equipment Recommendations None recommended by PT     Functional Status Assessment Patient has had a recent decline in their functional status and demonstrates the ability to make significant improvements in function in a reasonable and predictable amount of time.      Precautions / Restrictions Precautions Precautions: Fall Precaution/Restrictions Comments: orthostatic hypotension Required Braces or Orthoses: Other Brace Other Brace: abdominal binder, compression stockings Restrictions Weight Bearing Restrictions Per Provider Order: No      Mobility  Bed Mobility Overal bed mobility: Needs Assistance Bed Mobility: Supine to Sit    Supine to sit: Mod assist, +2 for safety/equipment, HOB elevated    General bed mobility comments: able to move LE's off EOB, ModAx2 for safety to raise trunk and shift hips forward    Transfers Overall transfer level: Needs assistance Equipment used: Rolling walker (2 wheels) Transfers: Sit to/from Stand, Bed to chair/wheelchair/BSC Sit to Stand: Mod assist, +2 physical assistance, +2 safety/equipment   Step pivot transfers: Mod assist, +2 physical assistance, +2 safety/equipment     General transfer comment: ModAx2 for heavy boost-up. Able to perform step-pivot to recliner.    Ambulation/Gait    General Gait Details: deferred 2/2 symptoms of lightheadedness    Balance Overall balance assessment: Needs assistance, Mild deficits observed, not formally tested Sitting-balance support: No upper extremity supported, Feet supported Sitting balance-[name]: Fair     Standing balance support: Bilateral upper extremity supported, During functional activity, Reliant on assistive device for balance Standing balance-[name]: Poor Standing balance comment: reliant on RW and external support       Pertinent Vitals/Pain Pain Assessment Pain Assessment: No/denies pain    Home Living Family/patient expects to be discharged to:: Private residence Living Arrangements: Alone Available Help at Discharge: Friend(s);Personal care attendant;Available PRN/intermittently Type of Home: House Home Access: Ramped entrance    Home Layout: One level Home  Equipment: Agricultural consultant (2 wheels);Wheelchair -  manual;Hospital bed;Shower seat;Cane - single point;Adaptive equipment;Hand held shower head;Other (comment) (trapeze for hospital bed)      Prior Function Prior Level of Function : Needs assist        Mobility Comments: Walks with RW with caregiver or friend close by ADLs Comments: Paid caregiver 6x week for 5 hours;     Extremity/Trunk Assessment   Upper Extremity Assessment Upper Extremity Assessment: Defer to OT evaluation    Lower Extremity Assessment Lower Extremity Assessment: Generalized weakness    Cervical / Trunk Assessment Cervical / Trunk Assessment: Other exceptions Cervical / Trunk Exceptions: increased body habitus  Communication   Communication Communication: No apparent difficulties    Cognition Arousal: Alert Behavior During Therapy: WFL for tasks assessed/performed, Anxious   PT - Cognitive impairments: No apparent impairments    Following commands: Intact       Cueing Cueing Techniques: Verbal cues, Tactile cues     General Comments General comments (skin integrity, edema, etc.): R digits with maroonish discoloration. Intermittent B hand tremors. Symptomatic with BP readings above. RN notified about  needing abdominal binder, compression stockings, and symptoms     PT Assessment Patient needs continued PT services  PT Problem List Decreased strength;Decreased activity tolerance;Decreased balance;Decreased mobility       PT Treatment Interventions Gait training;DME instruction;Functional mobility training;Therapeutic activities;Therapeutic exercise;Balance training;Patient/family education;Neuromuscular re-education    PT Goals (Current goals can be found in the Care Plan section)  Acute Rehab PT Goals Patient Stated Goal: to get better PT Goal Formulation: With patient Time For Goal Achievement: 01/06/24 Potential to Achieve Goals: Good    Frequency Min 2X/week     Co-evaluation PT/OT/SLP Co-Evaluation/Treatment: Yes Reason for  Co-Treatment: For patient/therapist safety;To address functional/ADL transfers PT goals addressed during session: Mobility/safety with mobility;Balance;Proper use of DME         AM-PAC PT "6 Clicks" Mobility  Outcome Measure Help needed turning from your back to your side while in a flat bed without using bedrails?: A Lot Help needed moving from lying on your back to sitting on the side of a flat bed without using bedrails?: A Lot Help needed moving to and from a bed to a chair (including a wheelchair)?: Total Help needed standing up from a chair using your arms (e.g., wheelchair or bedside chair)?: Total Help needed to walk in hospital room?: Total Help needed climbing 3-5 steps with a railing? : Total 6 Click Score: 8    End of Session Equipment Utilized During Treatment: Gait belt Activity Tolerance: Patient tolerated treatment well Patient left: in chair;with call bell/phone within reach Nurse Communication: Mobility status;Other (comment) (equipment needs, symptoms) PT Visit Diagnosis: Unsteadiness on feet (R26.81);Other abnormalities of gait and mobility (R26.89);Muscle weakness (generalized) (M62.81)    Time: 7829-5621 PT Time Calculation (min) (ACUTE ONLY): 34 min   Charges:   PT Evaluation $PT Eval Low Complexity: 1 Low   PT General Charges $$ ACUTE PT VISIT: 1 Visit        Orysia Blas, PT, DPT Secure Chat Preferred  Rehab Office [phone]   Alissa April Adela Ades 12/23/2023, 1:40 PM

## 2023-12-23 NOTE — Progress Notes (Signed)
 [facility] returned call they were unable to find glasses. Glasses found on tray. Returned to patient.

## 2023-12-23 NOTE — Progress Notes (Signed)
 PROGRESS NOTE    [name]  XBJ:[id number] DOB: Feb 01, 1946 DOA: 12/21/2023 PCP: Medicine, [facility]    Brief Narrative:   [name] is a 78 y.o. male with past medical history significant for chronic diastolic congestive heart failure, paroxysmal atrial fibrillation on Xarelto , history of orthostatic hypotension on midodrine /Florinef , anemia of chronic medical disease, OSA on CPAP, history of cirrhosis, morbid obesity who presented to [facility] ED on 12/21/2023 by recommendation of cardiologist from MRI due to dizziness and near syncopal episode.  Patient with multiple hospitalizations over the last several months for similar episodes with ongoing orthostatic hypotension and near syncope.  Recently seen in outpatient cardiology office with concern of possible amyloidosis with cardiac MRI ordered.  After obtaining MRI, patient with difficulty getting off the table and was subsequently sent to the ED for further evaluation.  In the ED, temperature 98.1 F, HR 80, RR 17, BP 118/73, SpO2 99% on 2 L nasal cannula.  WBC 7.7, hemoglobin 14.2, platelet count 185.  Sodium 137, potassium 4.1, chloride 96, CO2 26, glucose 128, BUN 22, Cram 1.47.  AST 31, ALT 18, total bilirubin 1.0.  BNP 252.2.  High sensitive troponin 41> 39.  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  Cardiology was consulted.  Patient given 500 mL bolus of NS.  TRH consulted for admission for  further evaluation management of recurrent weakness, presyncope concerning for likely history of underlying chronic orthostatic hypotension.  Assessment & Plan:   Near syncopal episode Hx orthostatic hypotension Patient presenting to the ED by direction of cardiology for near syncopal episode, weakness while performing cardiac MRI yesterday.  Patient with multiple hospitalizations over the last 6 months for similar episodes.  Recently seen by cardiology in the outpatient office with concerns for possible amyloidosis and underwent cardiac MRI on 12/21/2023 that was negative.  Also no valvular issues noted on MRI.  Patient has known history of atrial fibrillation that is well-controlled and no other arrhythmias identified.  Seen by cardiology, Dr. Maximo Spar; with recommendation of up titration of midodrine  and fludrocortisone . -- Midodrine  increased to 50 mg p.o. twice daily -- Fludrocortisone  increased to 0.1 mg p.o. daily -- Hold home Lasix  -- Repeat orthostatic vital signs in the a.m. -- PT/OT recommend SNF placement, TOC consulted -- Cardiology signed off, recommend outpatient follow-up with Dr. Avanell Bob  Chronic diastolic congestive heart failure, compensated Recent TTE 11/08/2023 with LVEF 55%, LV with normal function, no regional wall motion normalities, indeterminate diastolic parameters, no aortic valve stenosis, IVC normal in size. -- Outpatient follow-up with cardiology  Tremors Patient reports worsening tremors.  History of neuropathy.  Chronic microvascular ischemic changes noted on previous CT head. -- MRI brain with and without contrast -- Outpatient follow-up with neurology  Paroxysmal atrial fibrillation -- Xarelto  20 mg p.o. daily  Cirrhosis, compensated -- Continue to hold home diuretic  Neuropathy -- Gabapentin  300 mg p.o. twice daily  Chronic hypoxic respiratory failure OSA -- On 2 L nasal cannula at baseline -- Continue nocturnal CPAP  Morbid obesity, class III Body  mass index is 49.07 kg/m (pended).   DVT prophylaxis:  rivaroxaban  (XARELTO ) tablet  20 mg    Code Status: Do not attempt resuscitation (DNR) PRE-ARREST INTERVENTIONS DESIRED Family Communication: No family present at bedside this morning  Disposition Plan:  Level of care: Telemetry Cardiac Status is: Observation The patient remains OBS appropriate and will d/c before 2 midnights.    Consultants:  cardiology: Signed off 4/22  Procedures:  None  Antimicrobials:  none    Subjective: Patient seen examined bedside, lying in bed.  Seen by cardiology, increase midodrine  and Florinef  and signed off.  Reports he continues to feel "unwell".  Also reports questionable tremors.  Does not feel like he can return home.  Discussed with him once again may need SNF placement, seems to be more agreeable.  Seen by PT and OT with recommendation of SNF placement.  Will obtain MRI brain for further evaluation of his "tremors".  Will need likely follow-up with neurology outpatient if no significant finding.  Denies headache, no visual changes, no chest pain, no palpitations, no shortness of breath, no abdominal pain, no fever/chills/night sweats, no nausea/vomiting/diarrhea, no focal weakness, no fatigue, no paresthesias.  No acute events overnight per nursing staff.  Objective: Vitals:   12/23/23 0509 12/23/23 0739 12/23/23 0921 12/23/23 1131  BP: 134/65 (!) 136/58  (!) 127/54  Pulse: 80 73  81  Resp: (!) 24 20 (!) 26 (!) 21  Temp:  98.1 F (36.7 C)  98.1 F (36.7 C)  TempSrc:  Axillary  Axillary  SpO2: 100% 92% 95% 93%  Weight:      Height:        Intake/Output Summary (Last 24 hours) at 12/23/2023 1214 Last data filed at 12/23/2023 1104 Gross per 24 hour  Intake 600 ml  Output 1000 ml  Net -400 ml   Filed Weights   12/21/23 1547 12/23/23 0500  Weight: (!) 157.9 kg (!) (P) 146.4 kg    Examination:  Physical Exam: GEN: NAD, alert and oriented x 3, morbidly obese HEENT: NCAT, PERRL,  EOMI, sclera clear, MMM PULM: CTAB w/o wheezes/crackles, normal respiratory effort, on 2 L nasal cannula which is his baseline CV: RRR w/o M/G/R GI: abd soft, NTND, NABS, no R/G/M MSK: Trace bilateral lower extremity peripheral edema, moves all EXTR independently NEURO: CN II-XII intact, no focal deficits, sensation to light touch intact PSYCH: normal mood/affect Integumentary: No concerning rashes/lesions/wounds noted on exposed skin surfaces    Data Reviewed: I have personally reviewed following labs and imaging studies  CBC: Recent Labs  Lab 12/21/23 1600 12/22/23 0445  WBC 7.7 7.0  NEUTROABS 6.1  --   HGB 14.2 11.8*  HCT 43.7 36.7*  MCV 98.9 98.4  PLT 185 151   Basic Metabolic Panel: Recent Labs  Lab 12/21/23 1600 12/22/23 0445 12/23/23 0224  NA 137 135 137  K 4.1 3.6 4.8  CL 96* 97* 102  CO2 26 26 23   GLUCOSE 128* 103* 124*  BUN 22 20 24*  CREATININE 1.47* 1.42* 1.61*  CALCIUM  9.7 9.0 8.7*  MG 2.1 2.0 2.3  PHOS  --  3.0  --    GFR: Estimated Creatinine Clearance: 56.6 mL/min (A) (by C-G formula based on SCr of 1.61 mg/dL (H)). Liver Function Tests: Recent Labs  Lab 12/21/23 1600  AST 31  ALT 18  ALKPHOS 86  BILITOT 1.0  PROT 7.6  ALBUMIN  3.5   No results for input(s): "LIPASE", "AMYLASE" in the last 168 hours. No results for input(s): "AMMONIA" in the last 168 hours. Coagulation Profile: No results for input(s): "INR", "PROTIME"  in the last 168 hours. Cardiac Enzymes: No results for input(s): "CKTOTAL", "CKMB", "CKMBINDEX", "TROPONINI" in the last 168 hours. BNP (last 3 results) No results for input(s): "PROBNP" in the last 8760 hours. HbA1C: No results for input(s): "HGBA1C" in the last 72 hours. CBG: No results for input(s): "GLUCAP" in the last 168 hours. Lipid Profile: No results for input(s): "CHOL", "HDL", "LDLCALC", "TRIG", "CHOLHDL", "LDLDIRECT" in the last 72 hours. Thyroid  Function Tests: No results for input(s): "TSH", "T4TOTAL",  "FREET4", "T3FREE", "THYROIDAB" in the last 72 hours. Anemia Panel: No results for input(s): "VITAMINB12", "FOLATE", "FERRITIN", "TIBC", "IRON", "RETICCTPCT" in the last 72 hours. Sepsis Labs: Recent Labs  Lab 12/21/23 2028 12/21/23 2151  LATICACIDVEN 2.2* 1.7    Recent Results (from the past 240 hours)  MRSA Next Gen by PCR, Nasal     Status: Abnormal   Collection Time: 12/22/23  3:24 PM   Specimen: Nasal Mucosa; Nasal Swab  Result Value Ref Range Status   MRSA by PCR Next Gen DETECTED (A) NOT DETECTED Final    Comment: RESULT CALLED TO, READ BACK BY AND VERIFIED WITH: RN Lafonda Piety [phone] @[phone]  FH (NOTE) The GeneXpert MRSA Assay (FDA approved for NASAL specimens only), is one component of a comprehensive MRSA colonization surveillance program. It is not intended to diagnose MRSA infection nor to guide or monitor treatment for MRSA infections. Test performance is not FDA approved in patients less than 78 years old. Performed at [facility], [address] [place], [place] [address]          Radiology Studies: MR CARDIAC MORPHOLOGY W WO CONTRAST Result Date: 12/22/2023 CLINICAL DATA:  Cardiac amyloidosis suspected, further testing rule out Amyloidosis COMPARISON: Echo 11/07/23 EXAM: MR CARDIA MORPHOLOGY WITHOUT AND WITH CONTRAST; MR CARDIAC VELOCITY FLOW MAPPING TECHNIQUE: The patient was scanned on a 1.5 Tesla Siemens magnet. A dedicated cardiac coil was used. Functional imaging was done using TrueFisp sequences. 2,3, and 4 chamber views were done to assess for RWMA's. Modified Simpson's rule using a short axis stack was used to calculate an ejection fraction on a dedicated work [employer]. The patient received 10mL GADAVIST  GADOBUTROL  1 MMOL/ML IV SOLN. After 10 minutes inversion recovery sequences were used to assess for infiltration and scar tissue. Phase contrast velocity encoded images obtained x 2. This examination is tailored for evaluation cardiac  anatomy and function and provides very limited assessment of noncardiac structures, which are accordingly not evaluated during interpretation. If there is clinical concern for extracardiac pathology, further evaluation with CT imaging should be considered. FINDINGS: LEFT VENTRICLE: Left ventricular chamber size: Normal. Left ventricular wall thickness: Moderately increased, concentric. Maximal wall thickness 15 mm in the base. Normal left ventricular systolic function. LVEF = 59% There are no regional wall motion abnormalities. No myocardial edema, T2 = 51 msec Normal first pass perfusion. There is no post contrast delayed myocardial enhancement. Normal T1 myocardial nulling kinetics suggest against a diagnosis of cardiac amyloidosis. ECV = 32%, nonspecific elevation. RIGHT VENTRICLE: Normal right ventricular chamber size. Increased right ventricular wall thickness. Normal right ventricular systolic function. RVEF = 55% There are no regional wall motion abnormalities. No post contrast delayed myocardial enhancement. ATRIA: Left atrium: Mildly dilated Right atrium: Normal size Aorta: Sinus of Valsalva likely upper normal range for age and BSA, 37 x 39 x 39 mm sinus to sinus technique. PERICARDIUM: Normal pericardium.  No pericardial effusion. OTHER: Bilateral pleural effusions with fluid in the fissures. Dilated IVC MEASUREMENTS: Qp/Qs: 0.87  VALVES: Aortic valve regurgitation: trivial, regurgitant fraction 3% Pulmonary valve regurgitation: Trivial, regurgitant fraction 2% Mitral valve regurgitation: Trivial, regurgitant fraction 6% Tricuspid valve regurgitation: Mild, regurgitant fraction 14% Left ventricle: LV male LV EF: 59% (normal 49-79%) Absolute volumes: LV EDV: (Normal 95-215 mL) LV ESV: 44mL (Normal 25-85 mL) LV SV: 63mL (Normal 61-145 mL) CO: 4.4L/min (Normal 3.4-7.8 L/min) Indexed volumes: CI: 1.6L/min/sq-m (Normal 1.8-4.2 L/min/sq-m) LV EDV: 3mL/sq-m (Normal 50-108 mL/sq-m) LV ESV: 71mL/sq-m (Normal  11-47 mL/sq-m) LV SV: 62mL/sq-m (Normal 33-72 mL/sq-m) Right ventricle: RV male RV EF:  55% (normal 51-80%) Absolute volumes: RV EDV: (Normal 109-217 mL) RV ESV: 49mL (Normal 23-91 mL) RV SV: 59mL (Normal 71-141 mL) CO: 4.2L/min (Normal 2.8-8.8 L/min) Indexed volumes: CI: 1.52L/min/sq-m (Normal 1.7-4.2 L/min/sq-m) RV EDV: 84mL/sq-m (Normal 58-109 mL/sq-m) RV ESV: 48mL/sq-m (Normal 12-46 mL/sq-m) RV SV: 41mL/sq-m (Normal 38-71 mL/sq-m) IMPRESSION: 1. Normal left chamber size and function, LVEF 59% with moderate concentric LVH. No delayed myocardial enhancement. No myocardial edema. Normal T1 myocardial nulling. Findings do not suggest an infiltrative process. 2.  Normal right ventricular chamber size and function, RVEF 55%. 3.  No hemodynamically significant valvular heart disease. Electronically Signed   By: Grady Lawman M.D.   On: 12/22/2023 11:25   MR CARDIAC VELOCITY FLOW MAP Result Date: 12/22/2023 CLINICAL DATA:  Cardiac amyloidosis suspected, further testing rule out Amyloidosis COMPARISON: Echo 11/07/23 EXAM: MR CARDIA MORPHOLOGY WITHOUT AND WITH CONTRAST; MR CARDIAC VELOCITY FLOW MAPPING TECHNIQUE: The patient was scanned on a 1.5 Tesla Siemens magnet. A dedicated cardiac coil was used. Functional imaging was done using TrueFisp sequences. 2,3, and 4 chamber views were done to assess for RWMA's. Modified Simpson's rule using a short axis stack was used to calculate an ejection fraction on a dedicated work [employer], [employer]. The patient received 10mL GADAVIST  GADOBUTROL  1 MMOL/ML IV SOLN. After 10 minutes inversion recovery sequences were used to assess for infiltration and scar tissue. Phase contrast velocity encoded images obtained x 2. This examination is tailored for evaluation cardiac anatomy and function and provides very limited assessment of noncardiac structures, which are accordingly not evaluated during interpretation. If there is clinical concern for extracardiac pathology,  further evaluation with CT imaging should be considered. FINDINGS: LEFT VENTRICLE: Left ventricular chamber size: Normal. Left ventricular wall thickness: Moderately increased, concentric. Maximal wall thickness 15 mm in the base. Normal left ventricular systolic function. LVEF = 59% There are no regional wall motion abnormalities. No myocardial edema, T2 = 51 msec Normal first pass perfusion. There is no post contrast delayed myocardial enhancement. Normal T1 myocardial nulling kinetics suggest against a diagnosis of cardiac amyloidosis. ECV = 32%, nonspecific elevation. RIGHT VENTRICLE: Normal right ventricular chamber size. Increased right ventricular wall thickness. Normal right ventricular systolic function. RVEF = 55% There are no regional wall motion abnormalities. No post contrast delayed myocardial enhancement. ATRIA: Left atrium: Mildly dilated Right atrium: Normal size Aorta: Sinus of Valsalva likely upper normal range for age and BSA, 37 x 39 x 39 mm sinus to sinus technique. PERICARDIUM: Normal pericardium.  No pericardial effusion. OTHER: Bilateral pleural effusions with fluid in the fissures. Dilated IVC MEASUREMENTS: Qp/Qs: 0.87 VALVES: Aortic valve regurgitation: trivial, regurgitant fraction 3% Pulmonary valve regurgitation: Trivial, regurgitant fraction 2% Mitral valve regurgitation: Trivial, regurgitant fraction 6% Tricuspid valve regurgitation: Mild, regurgitant fraction 14% Left ventricle: LV male LV EF: 59% (normal 49-79%) Absolute volumes: LV EDV: (Normal 95-215 mL) LV ESV: 44mL (Normal 25-85 mL) LV SV: 63mL (Normal 61-145  mL) CO: 4.4L/min (Normal 3.4-7.8 L/min) Indexed volumes: CI: 1.6L/min/sq-m (Normal 1.8-4.2 L/min/sq-m) LV EDV: 49mL/sq-m (Normal 50-108 mL/sq-m) LV ESV: 58mL/sq-m (Normal 11-47 mL/sq-m) LV SV: 59mL/sq-m (Normal 33-72 mL/sq-m) Right ventricle: RV male RV EF:  55% (normal 51-80%) Absolute volumes: RV EDV: (Normal 109-217 mL) RV ESV: 49mL (Normal 23-91 mL) RV SV:  59mL (Normal 71-141 mL) CO: 4.2L/min (Normal 2.8-8.8 L/min) Indexed volumes: CI: 1.52L/min/sq-m (Normal 1.7-4.2 L/min/sq-m) RV EDV: 79mL/sq-m (Normal 58-109 mL/sq-m) RV ESV: 26mL/sq-m (Normal 12-46 mL/sq-m) RV SV: 54mL/sq-m (Normal 38-71 mL/sq-m) IMPRESSION: 1. Normal left chamber size and function, LVEF 59% with moderate concentric LVH. No delayed myocardial enhancement. No myocardial edema. Normal T1 myocardial nulling. Findings do not suggest an infiltrative process. 2.  Normal right ventricular chamber size and function, RVEF 55%. 3.  No hemodynamically significant valvular heart disease. Electronically Signed   By: Grady Lawman M.D.   On: 12/22/2023 11:25   MR CARDIAC VELOCITY FLOW MAP Result Date: 12/22/2023 CLINICAL DATA:  Cardiac amyloidosis suspected, further testing rule out Amyloidosis COMPARISON: Echo 11/07/23 EXAM: MR CARDIA MORPHOLOGY WITHOUT AND WITH CONTRAST; MR CARDIAC VELOCITY FLOW MAPPING TECHNIQUE: The patient was scanned on a 1.5 Tesla Siemens magnet. A dedicated cardiac coil was used. Functional imaging was done using TrueFisp sequences. 2,3, and 4 chamber views were done to assess for RWMA's. Modified Simpson's rule using a short axis stack was used to calculate an ejection fraction on a dedicated work [employer], [employer]. The patient received 10mL GADAVIST  GADOBUTROL  1 MMOL/ML IV SOLN. After 10 minutes inversion recovery sequences were used to assess for infiltration and scar tissue. Phase contrast velocity encoded images obtained x 2. This examination is tailored for evaluation cardiac anatomy and function and provides very limited assessment of noncardiac structures, which are accordingly not evaluated during interpretation. If there is clinical concern for extracardiac pathology, further evaluation with CT imaging should be considered. FINDINGS: LEFT VENTRICLE: Left ventricular chamber size: Normal. Left ventricular wall thickness: Moderately increased, concentric. Maximal  wall thickness 15 mm in the base. Normal left ventricular systolic function. LVEF = 59% There are no regional wall motion abnormalities. No myocardial edema, T2 = 51 msec Normal first pass perfusion. There is no post contrast delayed myocardial enhancement. Normal T1 myocardial nulling kinetics suggest against a diagnosis of cardiac amyloidosis. ECV = 32%, nonspecific elevation. RIGHT VENTRICLE: Normal right ventricular chamber size. Increased right ventricular wall thickness. Normal right ventricular systolic function. RVEF = 55% There are no regional wall motion abnormalities. No post contrast delayed myocardial enhancement. ATRIA: Left atrium: Mildly dilated Right atrium: Normal size Aorta: Sinus of Valsalva likely upper normal range for age and BSA, 37 x 39 x 39 mm sinus to sinus technique. PERICARDIUM: Normal pericardium.  No pericardial effusion. OTHER: Bilateral pleural effusions with fluid in the fissures. Dilated IVC MEASUREMENTS: Qp/Qs: 0.87 VALVES: Aortic valve regurgitation: trivial, regurgitant fraction 3% Pulmonary valve regurgitation: Trivial, regurgitant fraction 2% Mitral valve regurgitation: Trivial, regurgitant fraction 6% Tricuspid valve regurgitation: Mild, regurgitant fraction 14% Left ventricle: LV male LV EF: 59% (normal 49-79%) Absolute volumes: LV EDV: (Normal 95-215 mL) LV ESV: 44mL (Normal 25-85 mL) LV SV: 63mL (Normal 61-145 mL) CO: 4.4L/min (Normal 3.4-7.8 L/min) Indexed volumes: CI: 1.6L/min/sq-m (Normal 1.8-4.2 L/min/sq-m) LV EDV: 48mL/sq-m (Normal 50-108 mL/sq-m) LV ESV: 85mL/sq-m (Normal 11-47 mL/sq-m) LV SV: 54mL/sq-m (Normal 33-72 mL/sq-m) Right ventricle: RV male RV EF:  55% (normal 51-80%) Absolute volumes: RV EDV: (Normal 109-217 mL) RV ESV: 49mL (Normal 23-91 mL) RV SV:  59mL (Normal 71-141 mL) CO: 4.2L/min (Normal 2.8-8.8 L/min) Indexed volumes: CI: 1.52L/min/sq-m (Normal 1.7-4.2 L/min/sq-m) RV EDV: 64mL/sq-m (Normal 58-109 mL/sq-m) RV ESV: 5mL/sq-m (Normal 12-46  mL/sq-m) RV SV: 62mL/sq-m (Normal 38-71 mL/sq-m) IMPRESSION: 1. Normal left chamber size and function, LVEF 59% with moderate concentric LVH. No delayed myocardial enhancement. No myocardial edema. Normal T1 myocardial nulling. Findings do not suggest an infiltrative process. 2.  Normal right ventricular chamber size and function, RVEF 55%. 3.  No hemodynamically significant valvular heart disease. Electronically Signed   By: Grady Lawman M.D.   On: 12/22/2023 11:25   DG Chest Portable 1 View Result Date: 12/21/2023 CLINICAL DATA:  Hypoxia short of breath EXAM: PORTABLE CHEST 1 VIEW COMPARISON:  11/05/2023, 10/07/2018 FINDINGS: Mild cardiomegaly with aortic atherosclerosis. Chronic pleural changes and areas of scarring. No acute confluent airspace disease or pneumothorax. IMPRESSION: No active disease. Mild cardiomegaly. Chronic pleural changes and areas of scarring. Electronically Signed   By: Esmeralda Hedge M.D.   On: 12/21/2023 22:11   CT ANGIO LOWER EXT BILAT W &/OR WO CONTRAST Result Date: 12/21/2023 CLINICAL DATA:  Bilateral toe discoloration EXAM: CT ANGIOGRAPHY OF ABDOMINAL AORTA WITH ILIOFEMORAL RUNOFF TECHNIQUE: Multidetector CT imaging of the abdomen, pelvis and lower extremities was performed using the standard protocol during bolus administration of intravenous contrast. Multiplanar CT image reconstructions and MIPs were obtained to evaluate the vascular anatomy. RADIATION DOSE REDUCTION: This exam was performed according to the departmental dose-optimization program which includes automated exposure control, adjustment of the mA and/or kV according to patient size and/or use of iterative reconstruction technique. CONTRAST:  OMNIPAQUE  IOHEXOL  350 MG/ML SOLN COMPARISON:  Chest CT 10/07/2018 FINDINGS: VASCULAR Aorta: Normal caliber aorta without aneurysm, dissection, vasculitis or significant stenosis. Moderate aortic atherosclerosis. Celiac: Mild stenosis of the proximal celiac artery but  widely patent distal branches. No dissection, aneurysm or occlusion SMA: At least moderate focal stenosis of the proximal SMA about a cm distal to the origin. Distal vascular patency. No aneurysm or occlusion Renals: Single right and single left renal arteries. Calcified origins limits assessment for stenosis. No high-grade stenosis is seen. Suspect mild origin stenosis. IMA: Diminutive but patent. RIGHT Lower Extremity Inflow: Common, internal and external iliac arteries are patent without evidence of aneurysm, dissection, vasculitis or significant stenosis. Distal external iliac artery assessment limited by artifact from right hip hardware Outflow: Slightly limited assessment of right common femoral artery due to artifact from right hip hardware. Superficial and deep femoral arteries appear patent. Mild to moderate diffuse disease without high-grade focal stenosis or occlusion. Moderate disease of the popliteal artery without occlusion, aneurysm or high-grade stenosis. Runoff: Assessment of the posterior tibial artery is limited secondary to diffuse calcific disease. Definite patency of the anterior tibial and fibular arteries to the ankle, anterior tibial artery enhances at the dorsum of the foot. LEFT Lower Extremity Inflow: Common, internal and external iliac arteries are patent without evidence of aneurysm, dissection, vasculitis or significant stenosis. Moderate atherosclerosis. Partially obscured left common femoral artery from artifact arising from left hip hardware. Outflow: Left common femoral and deep femoral arteries appear grossly patent. Moderate focal stenosis of the proximal superficial femoral artery, series 6, image 287. This is about 6 cm distal to the origin of the SFA. Popliteal artery shows no significant stenosis, aneurysm or occlusion. Runoff: Assessment of the posterior tibial artery is limited due to diffuse calcific disease. Definite patency of the anterior tibial and fibular arteries to  the ankle, anterior tibial artery enhances at the  dorsum of the foot. Probable patency of posterior tibial artery to the level of the ankle. Veins: Suboptimally evaluated Review of the MIP images confirms the above findings. 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 2 suspected areas of pulmonary scarring in the lower lobes and left upper lobe. Cardiomegaly. Hepatobiliary: Cholecystectomy. No focal hepatic abnormality. Extrahepatic biliary dilatation, dilated common bile duct measuring up to 15 mm. Pancreas: Atrophic.  No inflammation Spleen: Possible small splenic calcification as may be seen with granuloma. Adrenals/Urinary Tract: Adrenal glands are normal. Kidneys are atrophic with cortical scarring. No hydronephrosis. Small kidney stones. Right renal cyst for which no imaging follow-up is recommended. The bladder is obscured by artifact Stomach/Bowel: Gastric bypass changes. No acute bowel wall thickening or evidence for obstruction. Lymphatic: No suspicious lymph nodes Reproductive: Obscured by artifact Other: Negative for pelvic effusion or free air Musculoskeletal: Bilateral hip replacements with artifact. No definite acute osseous abnormality is seen. Multilevel degenerative changes. Mild atrophy of thigh and calf muscles. No focal fluid collections. IMPRESSION: VASCULAR 1. Moderate aortic atherosclerosis without dissection, aneurysm or acute occlusive disease. 2. Diffusely diseased lower extremity vessels. At least moderate focal stenosis of the left superficial femoral artery as above. 3. Limited assessment of the posterior tibial arteries secondary to diffuse calcific disease. Definite 2 vessel patency to the ankle via the anterior tibial and fibular arteries. Anterior tibial arteries enhance at the dorsum of the foot. 4. At least moderate stenosis of the proximal superior mesenteric artery  NON-VASCULAR 1. 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 Areas of scarring in the left base and left upper lobe. Cardiomegaly 2. No definite CT evidence for acute intra-abdominal or pelvic abnormality 3. Cholecystectomy. Extrahepatic biliary dilatation which may be correlated with LFTs 4. Nonobstructing kidney stones Electronically Signed   By: Esmeralda Hedge M.D.   On: 12/21/2023 22:10        Scheduled Meds:  Chlorhexidine  Gluconate Cloth  6 each Topical Daily   fludrocortisone   0.1 mg Oral Daily   gabapentin   300 mg Oral BID   midodrine   15 mg Oral TID WC   mupirocin  ointment  1 Application Nasal BID   pantoprazole   40 mg Oral Daily   rivaroxaban   20 mg Oral QPM   sodium chloride  flush  3 mL Intravenous Q12H   Continuous Infusions:  sodium chloride        LOS: 0 days    Time spent: 52 minutes spent on 12/23/2023 caring for this patient face-to-face including chart review, ordering labs/tests, documenting, discussion with nursing staff, consultants, updating family and interview/physical exam    Rema Care [place], DO [facility] Available via Epic secure chat 7am-7pm After these hours, please refer to coverage provider listed on [email or web address] 12/23/2023, 12:14 PM

## 2023-12-23 NOTE — Discharge Instructions (Addendum)
 [employer] General Information-  The [employer] fleet provides services under contract for the general public and the majority of the Health and [employer] in [place]. Typical destinations include medical facilities, rehabilitation and treatment centers, educational facilities other than K-12 schools, employment, and grocery shopping. Fares may apply depending upon eligibility and destination. Time and service restrictions may exist due to limited levels of funding.  Quick Facts about [employer] Transportation:  25 vehicle fleet [place] only within [facility] Wheelchair accessible lift [place] service Standard conversion van service for ambulatory passengers Monday - Friday 6:00 AM- 6:00 PM, Saturdays for Dialysis passengers only  How do I set up transportation? There is no need to come to the office to make transportation arrangements. For your convenience, we are able to take your reservation over the phone. Call [phone] to schedule a ride or obtain more information. Due to seating capacity, Carloyn Chi availability, and route arrangements, please make note of the following:  Three (3) working day notice is required in advance of appointments for all in-county reservation requests A five (5) working day notice is required in advance of appointments for all out-of-county reservation requests  [employer] Benefits: [phone]

## 2023-12-23 NOTE — Plan of Care (Signed)

## 2023-12-23 NOTE — Progress Notes (Signed)
 Assigned RN called over to [facility] @ 2151 to inquire regarding patients glasses. Patient unable to find them. Spoke with Dawn the Diplomatic Services operational officer and [employer] stated Assigned RN would get a call back after the glasses were looked for. No call yet at time of this note @2254 . Will continue to monitor this shift.

## 2023-12-23 NOTE — Care Management Obs Status (Signed)
 MEDICARE OBSERVATION STATUS NOTIFICATION   Patient Details  Name: [name] MRN: [id number] Date of Birth: 1945/10/07   Medicare Observation Status Notification Given:     Moon/Obs letter signed and copy given  Wynonia Hedges 12/23/2023, 9:05 AM

## 2023-12-23 NOTE — Evaluation (Signed)
 Occupational Therapy Evaluation Patient Details Name: [name] MRN: [id number] DOB: 13-Nov-1945 Today's Date: 12/23/2023   History of Present Illness   78 y.o. male presents to [facility] 12/21/23 with syncope and lethargy. Admitted with near syncopal episode. Multiple prior admits for similar episodes. PMHx: cirrhosis, atrial fibrillation on Xarelto , chronic HFpEF, chronic orthostatic hypotension, chronic anemia     Clinical Impressions At baseline, pt completes self-feeding, grooming, and toileting tasks Independent to Mod I and requires assistance for bathing dressing, and IADLs. At baseline, pt performs functional mobility household distances with a RW with Mod I to Supervision. Pt now presents with decreased activity tolerance, impaired cardiopulmonary status, intermittent B UE tremors, decreased B UE strength, decreased balance, and decreased safety and independence with functional tasks. Pt currently demonstrates ability to complete UB ADLs with Set up to Min assist, LB ADLs with Max assist +2, and functional step-pivot transfers with a RW with Mod assist +2. Pt with episode of orthostatic hypotension with symptoms this session, affecting pt functional level. Pt also reporting suddenly feeling he was "distant from everything" and that the world around him "feels disconnected" when sitting in recliner. RN notified of BPs and pt report. Pt participated well in session and is motivated to return to PLOF.  Pt will benefit from acute skilled OT services to address deficits outlined below and to increase safety and independence with functional tasks. Post acute discharge, pt will benefit from intensive inpatient skilled rehab services < 3 hours per day to maximize rehab potential.   SpO2 92-95% on RA, HR 80-118 BPM during session        12/23/23 0921  Orthostatic Lying   BP- Lying 106/52  Pulse- Lying 90  Orthostatic Sitting  BP- Sitting 114/69  Pulse- Sitting 101  Orthostatic Standing at 0  minutes  BP- Standing at 0 minutes (!) 88/71  Pulse- Standing at 0 minutes 116       If plan is discharge home, recommend the following:   A lot of help with walking and/or transfers;A lot of help with bathing/dressing/bathroom;Assistance with cooking/housework;Assist for transportation;Help with stairs or ramp for entrance     Functional Status Assessment   Patient has had a recent decline in their functional status and demonstrates the ability to make significant improvements in function in a reasonable and predictable amount of time.     Equipment Recommendations   BSC/3in1 (Bariatric BSC/3-in-1; abdominal binder)     Recommendations for Other Services         Precautions/Restrictions   Precautions Precautions: Fall;Other (comment) Recall of Precautions/Restrictions: Intact Precaution/Restrictions Comments: orthostatic hypotension Required Braces or Orthoses: Other Brace Other Brace: abdominal binder, compression stockings Restrictions Weight Bearing Restrictions Per Provider Order: No     Mobility Bed Mobility Overal bed mobility: Needs Assistance Bed Mobility: Supine to Sit     Supine to sit: Mod assist, +2 for safety/equipment, HOB elevated     General bed mobility comments: able to move LEs off EOB, ModAx2 for safety to raise trunk and shift hips forward    Transfers Overall transfer level: Needs assistance Equipment used: Rolling walker (2 wheels) Transfers: Sit to/from Stand, Bed to chair/wheelchair/BSC Sit to Stand: Mod assist, +2 physical assistance, +2 safety/equipment     Step pivot transfers: Mod assist, +2 physical assistance, +2 safety/equipment     General transfer comment: Heavy ModAx2 for boost-up. Able to perform step-pivot to recliner.      Balance Overall balance assessment: Needs assistance, Mild deficits observed, not formally tested  Sitting-balance support: No upper extremity supported, Feet supported Sitting  balance-[name]: Fair     Standing balance support: Bilateral upper extremity supported, During functional activity, Reliant on assistive device for balance Standing balance-[name]: Poor Standing balance comment: reliant on RW and external support                           ADL either performed or assessed with clinical judgement   ADL Overall ADL's : Needs assistance/impaired Eating/Feeding: Set up;Sitting   Grooming: Set up;Sitting   Upper Body Bathing: Minimal assistance;Sitting;Cueing for compensatory techniques (with increased time)   Lower Body Bathing: Maximal assistance;+2 for physical assistance;+2 for safety/equipment;Sitting/lateral leans;Sit to/from stand;Cueing for compensatory techniques   Upper Body Dressing : Contact guard assist;Minimal assistance;Sitting   Lower Body Dressing: Maximal assistance;+2 for physical assistance;+2 for safety/equipment;Sitting/lateral leans;Sit to/from stand;Cueing for compensatory techniques   Toilet Transfer: Moderate assistance;+2 for physical assistance;+2 for safety/equipment;BSC/3in1;Rolling walker (2 wheels);Cueing for safety (step-pivot transfer) Toilet Transfer Details (indicate cue type and reason): simulated bed to chiar Toileting- Clothing Manipulation and Hygiene: Maximal assistance;+2 for physical assistance;+2 for safety/equipment;Sitting/lateral lean;Sit to/from stand;Cueing for compensatory techniques         General ADL Comments: Pt with decreased activity tolerance and limited by episode of orthostatic hypotension during session affecting pt functional level. OT initiated education for identifying, managing/preventing, and respondeding to episodes of orthostatic hypotension to increase safety and independence with pt verbalizing understanding of educaiton. Pt will benefit from reinforcement of education.     Vision Baseline Vision/History: 1 Wears glasses Ability to See in Adequate Light: 0 Adequate  (with glasses) Patient Visual Report: No change from baseline       Perception         Praxis         Pertinent Vitals/Pain Pain Assessment Pain Assessment: No/denies pain     Extremity/Trunk Assessment Upper Extremity Assessment Upper Extremity Assessment: Right hand dominant;Generalized weakness;RUE deficits/detail;LUE deficits/detail RUE Deficits / Details: generalized weakness; intermittent tremors with activity with pt reporting tremors just started earlier this morning, RN and MD aware LUE Deficits / Details: generalized weakness; intermittent tremors with activity with pt reporting tremors just started earlier this morning, RN and MD aware   Lower Extremity Assessment Lower Extremity Assessment: Defer to PT evaluation   Cervical / Trunk Assessment Cervical / Trunk Assessment: Other exceptions Cervical / Trunk Exceptions: increased body habitus   Communication Communication Communication: No apparent difficulties   Cognition Arousal: Alert Behavior During Therapy: WFL for tasks assessed/performed, Anxious Cognition: No apparent impairments             OT - Cognition Comments: Pt AAOx4 and pleasant throughout session. Pt cognition WFL for tasks assessed. Cogntiion not formally screened or eveluated.                 Following commands: Intact       Cueing  General Comments   Cueing Techniques: Verbal cues;Tactile cues  Pt with episode of orthostatic hypotension this session with pt reporting dizziness/lightheadedness in sitting and worsening in standing. Pt HR and O2 sat stable on RA throughout session. Once pt in the chair this session, he reported suddenly feeling he was "distant from everything" and that the world around him "feels disconnected." Pt reports he does not feel seperate or disconnected from his body. He also reported this was a new sensation for him and has not experienced it in the past. RN notified of pt  report.   Exercises      Shoulder Instructions      Home Living Family/patient expects to be discharged to:: Private residence Living Arrangements: Alone Available Help at Discharge: Friend(s);Personal care attendant;Available PRN/intermittently Type of Home: House Home Access: Ramped entrance     Home Layout: One level     Bathroom Shower/Tub: Producer, television/film/video: Handicapped height Bathroom Accessibility: Yes How Accessible: Accessible via walker (but tight) Home Equipment: Rolling Walker (2 wheels);Wheelchair - manual;Hospital bed;Shower seat;Cane - single point;Adaptive equipment;Hand held shower head;Other (comment) (trapeze for hospital bed) Adaptive Equipment: Reacher        Prior Functioning/Environment Prior Level of Function : Needs assist             Mobility Comments: Walks with RW. Pt reports no falls in the past 6 months. ADLs Comments: Pt receives assistance for bathing, dressing, and IADLs. Pt is Independent to Mod I with toileting. Pt has a paid caregiver 6x week for 5 hours. Friend assists on day paid caregiver does not come.    OT Problem List: Decreased strength;Decreased activity tolerance;Impaired balance (sitting and/or standing);Decreased coordination;Cardiopulmonary status limiting activity   OT Treatment/Interventions: Self-care/ADL training;Therapeutic exercise;DME and/or AE instruction;Therapeutic activities;Patient/family education;Balance training      OT Goals(Current goals can be found in the care plan section)   Acute Rehab OT Goals Patient Stated Goal: to not have episodes of dizziness, be more independent, and to be able to return home OT Goal Formulation: With patient Time For Goal Achievement: 01/06/24 Potential to Achieve Goals: Good ADL Goals Pt Will Perform Lower Body Bathing: with min assist;sitting/lateral leans;sit to/from stand (with adaptive equipment as needed) Pt Will Perform Upper Body Dressing: with set-up;sitting Pt Will  Perform Lower Body Dressing: with min assist;sitting/lateral leans;sit to/from stand (with adaptive equipment as needed) Pt Will Transfer to Toilet: with min assist;ambulating;bedside commode (with least restrictive AD) Pt Will Perform Toileting - Clothing Manipulation and hygiene: with min assist;sitting/lateral leans;sit to/from stand Additional ADL Goal #1: Patient will demonstrate through teach back understanding of education regarding strategies for identifying, helping to prevent, and appropriately responding to episodes of orthostatic hypotension to increase safety and independence with functional tasks.   OT Frequency:  Min 2X/week    Co-evaluation PT/OT/SLP Co-Evaluation/Treatment: Yes Reason for Co-Treatment: For patient/therapist safety;To address functional/ADL transfers   OT goals addressed during session: ADL's and self-care      AM-PAC OT "6 Clicks" Daily Activity     Outcome Measure Help from another person eating meals?: A Little Help from another person taking care of personal grooming?: A Little Help from another person toileting, which includes using toliet, bedpan, or urinal?: A Lot Help from another person bathing (including washing, rinsing, drying)?: A Lot Help from another person to put on and taking off regular upper body clothing?: A Little Help from another person to put on and taking off regular lower body clothing?: A Lot 6 Click Score: 15   End of Session Equipment Utilized During Treatment: Gait belt;Rolling walker (2 wheels) Nurse Communication: Mobility status;Other (comment) (Pt with episode of orthostatic hypotension with symptoms. Pt reporting sensation of feeling "distant from everything" and  "disconnected." Pt needs new abdominal binder and compression stockings.)  Activity Tolerance: Patient tolerated treatment well;Treatment limited secondary to medical complications (Comment) (limited by episode of orthostatic hypotension) Patient left: in  chair;with call bell/phone within reach;with chair alarm set  OT Visit Diagnosis: Unsteadiness on feet (R26.81);Other abnormalities of gait and mobility (R26.89);Muscle weakness (generalized) (  M62.81);Other (comment) (decreased activity tolerance)                Time: 9147-8295 OT Time Calculation (min): 34 min Charges:  OT General Charges $OT Visit: 1 Visit OT Evaluation $OT Eval Low Complexity: 1 Low  [name] "[name]., OTR/L, MA Acute Rehab [phone]   [name] 12/23/2023, 3:25 PM

## 2023-12-23 NOTE — NC FL2 (Signed)
 [place]  MEDICAID FL2 LEVEL OF CARE FORM     IDENTIFICATION  Patient Name: [name] Birthdate: 03/29/1946 Sex: male Admission Date (Current Location): 12/21/2023  [facility] [place] and [place] Number:  [employer] and Address:  [facility], 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   Chronic respiratory failure with hypoxia (HCC) 12/21/2023   Elevated serum immunoglobulin free light chains 12/08/2023   Generalized weakness 11/06/2023   Lactic acidosis 11/06/2023   Uncomplicated opioid dependence (HCC) 11/06/2023   Orthostatic hypotension 10/31/2023   COVID-19 virus infection 09/26/2020   Near syncope 09/25/2020   Macrocytosis 09/25/2020   Paroxysmal atrial fibrillation (HCC) 09/25/2020   Goals of care, counseling/discussion    Palliative care encounter    Hyperkalemia 10/01/2018   Other cirrhosis of liver (HCC) 10/01/2018   Chronic back pain 10/01/2018   Cellulitis of left hip    Recurrent 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 09/04/2018   Physical debility 09/04/2018   Pleural effusion, bilateral    Hypertensive urgency 08/17/2018   Pleural effusion on left 08/17/2018   Chronic diastolic CHF (congestive heart failure) (HCC) 08/17/2018   OSA on CPAP 08/17/2018   Chronic atrial fibrillation (HCC)    Delirium    Depression    OSA (obstructive sleep apnea)    Obesity, Class III, BMI 40-49.9 (morbid obesity) (HCC)    Atrial fibrillation with RVR (HCC) 04/16/2018    Morbid obesity with BMI of 50.0-59.9, adult (HCC) 04/16/2018   AKI (acute kidney injury) (HCC) 04/16/2018   Visual hallucination 04/16/2018   SIRS (systemic inflammatory response syndrome) (HCC) 04/16/2018   Anemia of chronic disease 04/16/2018   Left hip postoperative wound infection 03/15/2018   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   Femur fracture, left (HCC) 02/15/2018   Macrocytic anemia 02/15/2018   Femur fracture (HCC) 02/15/2018   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   Spondylosis of cervical region without myelopathy or radiculopathy 04/30/2017   Gastroesophageal reflux disease 02/10/2017   SOB (shortness of breath) 03/31/2014   Hypertension    Hyponatremia 03/18/2012   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HCC) 03/17/2012    Orientation RESPIRATION BLADDER Height & Weight     Time, Situation, Place, Self  Normal (Room Air; Patient uses home CPAP for OSA) Incontinent, External catheter Weight: (!) (P) 322 lb 12.1 oz (146.4 kg) Height:  5\' 8"  (172.7 cm)  BEHAVIORAL SYMPTOMS/MOOD NEUROLOGICAL BOWEL NUTRITION STATUS      Continent Diet (Please see dc summary)  AMBULATORY STATUS COMMUNICATION OF NEEDS Skin   Extensive Assist Verbally Normal                       Personal Care Assistance Level of Assistance  Bathing, Feeding, Dressing Bathing Assistance: Maximum assistance Feeding assistance: Maximum assistance Dressing Assistance: Maximum assistance     Functional Limitations Info             SPECIAL CARE FACTORS FREQUENCY  PT (By licensed PT), OT (  By licensed OT)     PT Frequency: 5x OT Frequency: 5x            Contractures Contractures Info: Not present    Additional Factors Info  Code Status, Allergies Code Status Info: DNR- PRE-ARREST INTERVENTIONS DESIRED Allergies Info: Bee Venom and Nickel           Current Medications (12/23/2023):  This is the current hospital active medication list Current  Facility-Administered Medications  Medication Dose Route Frequency Provider Last Rate Last Admin   0.9 %  sodium chloride  infusion   Intravenous Continuous [place], Eric J, DO 75 mL/hr at 12/23/23 1237 New Bag at 12/23/23 1237   acetaminophen  (TYLENOL ) tablet 650 mg  650 mg Oral Q6H PRN Opyd, Timothy S, MD   650 mg at 12/22/23 8119   Or   acetaminophen  (TYLENOL ) suppository 650 mg  650 mg Rectal Q6H PRN Opyd, Timothy S, MD       Chlorhexidine  Gluconate Cloth 2 % PADS 6 each  6 each Topical Daily [place], Eric J, DO   6 each at 12/23/23 1000   fludrocortisone  (FLORINEF ) tablet 0.1 mg  0.1 mg Oral Daily Hazle Lites, MD   0.1 mg at 12/23/23 1478   gabapentin  (NEURONTIN ) capsule 300 mg  300 mg Oral BID Opyd, Timothy S, MD   300 mg at 12/23/23 2956   gadobutrol  (GADAVIST ) 1 MMOL/ML injection 10 mL  10 mL Intravenous Once PRN [place], Eric J, DO       HYDROcodone -acetaminophen  (NORCO) 10-325 MG per tablet 1 tablet  1 tablet Oral Q6H PRN [place], Eric J, DO   1 tablet at 12/22/23 2130   levalbuterol  (XOPENEX ) nebulizer solution 0.63 mg  0.63 mg Inhalation Q6H PRN Opyd, Timothy S, MD       midodrine  (PROAMATINE ) tablet 15 mg  15 mg Oral TID WC Hazle Lites, MD   15 mg at 12/23/23 1201   mupirocin  ointment (BACTROBAN ) 2 % 1 Application  1 Application Nasal BID [place], Eric J, DO   1 Application at 12/23/23 8657   ondansetron  (ZOFRAN ) tablet 4 mg  4 mg Oral Q6H PRN Opyd, Timothy S, MD       Or   ondansetron  (ZOFRAN ) injection 4 mg  4 mg Intravenous Q6H PRN Opyd, Timothy S, MD       pantoprazole  (PROTONIX ) EC tablet 40 mg  40 mg Oral Daily Opyd, Timothy S, MD   40 mg at 12/23/23 8469   rivaroxaban  (XARELTO ) tablet 20 mg  20 mg Oral QPM Opyd, Timothy S, MD   20 mg at 12/22/23 1727   senna-docusate (Senokot-S) tablet 1 tablet  1 tablet Oral QHS PRN Opyd, Timothy S, MD       sodium chloride  flush (NS) 0.9 % injection 3 mL  3 mL Intravenous Q12H Opyd, Timothy S, MD   3 mL at 12/23/23 1000      Discharge Medications: Please see discharge summary for a list of discharge medications.  Relevant Imaging Results:  Relevant Lab Results:   Additional Information SS# [id number].  Patient uses home CPAP for OSA  Juliane Och, LCSW

## 2023-12-23 NOTE — Progress Notes (Addendum)
 1420: Pts BP 126/54 (75) MD notified per order. MD stated no concern. No orders given.   1607: Pt BP 107/47 (61) MD notified Per orders. MD stated not concerned. No orders given.

## 2023-12-23 NOTE — TOC Initial Note (Addendum)
 Transition of Care [facility]) - Initial/Assessment Note    Patient Details  Name: [name] MRN: [id number] Date of Birth: 1946-03-12  Transition of Care [facility]) CM/SW Contact:    Juliane Och, LCSW Phone Number: 12/23/2023, 2:49 PM  Clinical Narrative:                  2:49 PM CSW introduced self and role to patient at bedside. Patient confirmed that he resides at home alone. Patient stated that friends could provide transportation and home support if needed upon discharge. CSW offered additional transportation resources. Patient accepted CSW offer, and CSW provided transportation resources ([facility] and [facility] contact information). CSW added resources to patient's AVS, as well. CSW informed patient of therapy recommendation of discharge to SNF. Patient consented CSW to submit referrals to SNFs close to home in [place]. Patient expressed concerns regarding hypotension, fogginess, and tremors. CSW relayed concerns to medical team. Patient confirmed that he is active with HH ([facility]) and has DME at home (hospital bed, wheelchair, walker, cane, trapeze). Patient confirmed SNF history ([facility], [facility], [facility]). Patient stated that he would not want to return to [facility].  Expected Discharge Plan: Skilled Nursing Facility Barriers to Discharge: Continued Medical Work up, SNF Pending bed offer, Insurance Authorization   Patient Goals and CMS Choice Patient states their goals for this hospitalization and ongoing recovery are:: SNF          Expected Discharge Plan and Services In-house Referral: Clinical Social Work   Post Acute Care Choice: Skilled Nursing Facility Living arrangements for the past 2 months: Single Family Home                                      Prior Living Arrangements/Services Living arrangements for the past 2 months: Single Family Home Lives with:: Self Patient language and need  for interpreter reviewed:: Yes Do you feel safe going back to the place where you live?: Yes            Criminal Activity/Legal Involvement Pertinent to Current Situation/Hospitalization: No - Comment as needed  Activities of Daily Living   ADL Screening (condition at time of admission) Independently performs ADLs?: No Does the patient have a NEW difficulty with bathing/dressing/toileting/self-feeding that is expected to last >3 days?: Yes (Initiates electronic notice to provider for possible OT consult) Does the patient have a NEW difficulty with getting in/out of bed, walking, or climbing stairs that is expected to last >3 days?: Yes (Initiates electronic notice to provider for possible PT consult) Does the patient have a NEW difficulty with communication that is expected to last >3 days?: No Is the patient deaf or have difficulty hearing?: No Does the patient have difficulty seeing, even when wearing glasses/contacts?: No Does the patient have difficulty concentrating, remembering, or making decisions?: No  Permission Sought/Granted Permission sought to share information with : Family Supports, Oceanographer granted to share information with : No (Contact information on chart)  Share Information with NAME: Tyshaun Vinzant  Permission granted to share info w AGENCY: SNF  Permission granted to share info w Relationship: Relative  Permission granted to share info w Contact Information: [phone]  Emotional Assessment Appearance:: Appears stated age Attitude/Demeanor/Rapport: Engaged Affect (typically observed): Accepting, Appropriate, Adaptable, Calm, Stable, Pleasant Orientation: : Oriented to Self, Oriented to Place, Oriented to  Time, Oriented to  Situation Alcohol  / Substance Use: Not Applicable Psych Involvement: No (comment)  Admission diagnosis:  Near syncope [R55] Hypotension, unspecified hypotension type [I95.9] Other fatigue  [R53.83] 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] Patient Active Problem List   Diagnosis Date Noted   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   Chronic respiratory failure with hypoxia (HCC) 12/21/2023   Elevated serum immunoglobulin free light chains 12/08/2023   Generalized weakness 11/06/2023   Lactic acidosis 11/06/2023   Uncomplicated opioid dependence (HCC) 11/06/2023   Orthostatic hypotension 10/31/2023   COVID-19 virus infection 09/26/2020   Near syncope 09/25/2020   Macrocytosis 09/25/2020   Paroxysmal atrial fibrillation (HCC) 09/25/2020   Goals of care, counseling/discussion    Palliative care encounter    Hyperkalemia 10/01/2018   Other cirrhosis of liver (HCC) 10/01/2018   Chronic back pain 10/01/2018   Cellulitis of left hip    Recurrent 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 09/04/2018   Physical debility 09/04/2018   Pleural effusion, bilateral    Hypertensive urgency 08/17/2018   Pleural effusion on left 08/17/2018   Chronic diastolic CHF (congestive heart failure) (HCC) 08/17/2018   OSA on CPAP 08/17/2018   Chronic atrial fibrillation (HCC)    Delirium    Depression    OSA (obstructive sleep apnea)    Obesity, Class III, BMI 40-49.9 (morbid obesity) (HCC)    Atrial fibrillation with RVR (HCC) 04/16/2018   Morbid obesity with BMI of 50.0-59.9, adult (HCC) 04/16/2018   AKI (acute kidney injury) (HCC) 04/16/2018   Visual hallucination 04/16/2018   SIRS (systemic inflammatory response syndrome) (HCC) 04/16/2018   Anemia of chronic disease 04/16/2018   Left hip postoperative wound infection 03/15/2018   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   Femur fracture, left (HCC) 02/15/2018   Macrocytic anemia 02/15/2018   Femur fracture (HCC) 02/15/2018   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   Spondylosis of cervical region without myelopathy or radiculopathy 04/30/2017   Gastroesophageal reflux disease 02/10/2017   SOB  (shortness of breath) 03/31/2014   Hypertension    Hyponatremia 03/18/2012   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HCC) 03/17/2012   PCP:  Medicine, [facility]acy:   [facility] [place], [place] - [address] [place] [address] Phone: [phone] Fax: [phone]     Social Drivers of Health (SDOH) Social History: SDOH Screenings   Food Insecurity: No Food Insecurity (12/22/2023)  Housing: Low Risk  (12/22/2023)  Transportation Needs: No Transportation Needs (12/22/2023)  Utilities: Not At Risk (12/22/2023)  Financial Resource Strain: Low Risk  (09/15/2023)   Received from [facility]  Physical Activity: Sufficiently Active (01/11/2023)   Received from [facility]  Social Connections: Moderately Isolated (12/22/2023)  Stress: No Stress Concern Present (10/08/2023)   Received from [facility]  Tobacco Use: Medium Risk (12/21/2023)   SDOH Interventions:     Readmission Risk Interventions    11/09/2023   10:58 AM  Readmission Risk Prevention Plan  Transportation Screening Complete  Home Care Screening Complete  Medication Review (RN CM) Complete

## 2023-12-24 DIAGNOSIS — R55 Syncope and collapse: Secondary | ICD-10-CM | POA: Diagnosis not present

## 2023-12-24 LAB — BASIC METABOLIC PANEL WITH GFR
Anion gap: 11 (ref 5–15)
BUN: 21 mg/dL (ref 8–23)
CO2: 26 mmol/L (ref 22–32)
Calcium: 8.6 mg/dL — ABNORMAL LOW (ref 8.9–10.3)
Chloride: 99 mmol/L (ref 98–111)
Creatinine, Ser: 1.43 mg/dL — ABNORMAL HIGH (ref 0.61–1.24)
GFR, Estimated: 50 mL/min — ABNORMAL LOW (ref >60)
Glucose, Bld: 99 mg/dL (ref 70–99)
Potassium: 3.9 mmol/L (ref 3.5–5.1)
Sodium: 136 mmol/L (ref 135–145)

## 2023-12-24 LAB — INTELLIGEN MYELOID

## 2023-12-24 MED ORDER — DIGOXIN 125 MCG PO TABS
0.2500 mg | ORAL_TABLET | Freq: Every day | ORAL | Status: DC
  Administered 2023-12-24 – 2023-12-26 (×3): 0.25 mg via ORAL
  Filled 2023-12-24 (×3): qty 2

## 2023-12-24 MED ORDER — SODIUM CHLORIDE 0.9 % IV SOLN: INTRAVENOUS | Status: AC

## 2023-12-24 MED ORDER — MIDODRINE HCL 5 MG PO TABS
10.0000 mg | ORAL_TABLET | Freq: Three times a day (TID) | ORAL | Status: DC
  Administered 2023-12-24 – 2023-12-26 (×6): 10 mg via ORAL
  Filled 2023-12-24 (×6): qty 2

## 2023-12-24 NOTE — TOC Progression Note (Signed)
 Transition of Care [facility]) - Progression Note    Patient Details  Name: [name] MRN: [id number] Date of Birth: 1946/04/18  Transition [facility] Care [facility]) CM/SW Contact  Shanterica Biehler A [place], LCSW Phone Number: 12/24/2023, 1:38 PM  Clinical Narrative:     CSW met with pt at bedside to provide bed offers with Medicare.gov ratings. He stated he would make a decision on a facility and let CSW know. CSW contact information provided.   TOC will continue to follow.     Expected Discharge Plan: Skilled Nursing Facility Barriers to Discharge: Continued Medical Work up, SNF Pending bed offer, English as a second language teacher  Expected Discharge Plan and Services In-house Referral: Clinical Social Work   Post Acute Care Choice: Skilled Nursing Facility Living arrangements for the past 2 months: Single Family Home                                       Social Determinants of Health (SDOH) Interventions SDOH Screenings   Food Insecurity: No Food Insecurity (12/22/2023)  Housing: Low Risk  (12/22/2023)  Transportation Needs: No Transportation Needs (12/22/2023)  Utilities: Not At Risk (12/22/2023)  Financial Resource Strain: Low Risk  (09/15/2023)   Received from [facility]  Physical Activity: Sufficiently Active (01/11/2023)   Received from [facility]  Social Connections: Moderately Isolated (12/22/2023)  Stress: No Stress Concern Present (10/08/2023)   Received from [facility]  Tobacco Use: Medium Risk (12/21/2023)    Readmission Risk Interventions    11/09/2023   10:58 AM  Readmission Risk Prevention Plan  Transportation Screening Complete  Home Care Screening Complete  Medication Review (RN CM) Complete

## 2023-12-24 NOTE — Progress Notes (Signed)
 Mobility Specialist: Progress Note   12/24/23 1232  Mobility  Activity Transferred from chair to bed  Level of Assistance +2 (takes two people)  Press photographer wheel walker  Activity Response Tolerated well  Mobility Referral Yes  Mobility visit 1 Mobility  Mobility Specialist Start Time (ACUTE ONLY) 1210  Mobility Specialist Stop Time (ACUTE ONLY) 1218  Mobility Specialist Time Calculation (min) (ACUTE ONLY) 8 min    Pt requesting to get back to bed - received in chair. C/o of not feeling well and his body being sore in the chair. ModA+2 for STS, MinA+1 for stand pivot to bed. ModA+1 to adjust in bed. Pt put in trendelenburg in was able to pull himself up with minA+1. Left in bed with all needs met, call bell in reach. RN present.   Deloria Fetch Mobility Specialist Please contact via SecureChat or Rehab office at [phone]

## 2023-12-24 NOTE — Progress Notes (Signed)
 PROGRESS NOTE    [name]  ZOX:[id number] DOB: [birth year] DOA: 12/21/2023 PCP: Medicine, [facility]    Brief Narrative:   [name] is a 78 y.o. male with past medical history significant for chronic diastolic congestive heart failure, paroxysmal atrial fibrillation on Xarelto , history of orthostatic hypotension on midodrine /Florinef , anemia of chronic medical disease, OSA on CPAP, history of cirrhosis, morbid obesity who presented to [facility] ED on 12/21/2023 by recommendation of cardiologist from MRI due to dizziness and near syncopal episode.  Patient with multiple hospitalizations over the last several months for similar episodes with ongoing orthostatic hypotension and near syncope.  Recently seen in outpatient cardiology office with concern of possible amyloidosis with cardiac MRI ordered.  After obtaining MRI, patient with difficulty getting off the table and was subsequently sent to the ED for further evaluation.  In the ED, temperature 98.1 F, HR 80, RR 17, BP 118/73, SpO2 99% on 2 L nasal cannula.  WBC 7.7, hemoglobin 14.2, platelet count 185.  Sodium 137, potassium 4.1, chloride 96, CO2 26, glucose 128, BUN 22, Cram 1.47.  AST 31, ALT 18, total bilirubin 1.0.  BNP 252.2.  High sensitive troponin 41> 39.  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  Cardiology was consulted.  Patient given 500 mL bolus of NS.  TRH consulted for admission for  further evaluation management of recurrent weakness, presyncope concerning for likely history of underlying chronic orthostatic hypotension.  Assessment & Plan:   Near syncopal episode Hx orthostatic hypotension Patient presenting to the ED by direction of cardiology for near syncopal episode, weakness while performing cardiac MRI yesterday.  Patient with multiple hospitalizations over the last 6 months for similar episodes.  Recently seen by cardiology in the outpatient office with concerns for possible amyloidosis and underwent cardiac MRI on 12/21/2023 that was negative.  Also no valvular issues noted on MRI.  Patient has known history of atrial fibrillation that is well-controlled and no other arrhythmias identified.  Seen by cardiology, Dr. Maximo Spar; with recommendation of up titration of midodrine  and fludrocortisone . -- Midodrine  increased to 15 mg p.o. TID by cardiology; will reduce back to 10mg  TID per patient request today -- Fludrocortisone  increased to 0.1 mg p.o. daily -- Hold home Lasix  -- Repeat orthostatic vital signs in the a.m. -- PT/OT recommend SNF placement, TOC following -- Cardiology signed off, recommend outpatient follow-up with Dr. Avanell Bob  Chronic diastolic congestive heart failure, compensated Recent TTE 11/08/2023 with LVEF 55%, LV with normal function, no regional wall motion normalities, indeterminate diastolic parameters, no aortic valve stenosis, IVC normal in size. -- Outpatient follow-up with cardiology  Tremors: Resolved Patient reports worsening tremors.  History of neuropathy.  Chronic microvascular ischemic changes noted on previous CT head.  MRI brain with and without contrast unrevealing other than chronic microvascular ischemic changes. -- Outpatient follow-up with neurology PRN  Paroxysmal atrial fibrillation -- Xarelto  20 mg p.o. daily  Cirrhosis, compensated -- Continue to hold home diuretic  Neuropathy -- Gabapentin  300 mg p.o. twice daily  Chronic  hypoxic respiratory failure OSA -- On 2 L nasal cannula at baseline -- Continue nocturnal CPAP  Morbid obesity, class III Body mass index is 50.15 kg/m.   DVT prophylaxis: rivaroxaban  (XARELTO ) tablet 20 mg Start: 12/22/23 1800 rivaroxaban  (XARELTO ) tablet 20 mg    Code Status: Do not attempt resuscitation (DNR) PRE-ARREST INTERVENTIONS DESIRED Family Communication: No family present at bedside this morning  Disposition Plan:  Level of care: Telemetry Medical Status is: Inpatient Remains inpatient appropriate because: Pending SNF placement      Consultants:  cardiology: Signed off 4/22  Procedures:  None  Antimicrobials:  none    Subjective: Patient seen examined bedside, lying in bed.  RN present at bedside.  Discussed needs to maintain hydration, urine notably dark in color.  Likely complicating factor to his orthostatic hypotension.  Patient desires decrease of midodrine  back to 10 mg as he believes this is causing his symptoms of "tremors", and "fogginess".  Discussed hesitation but will comply with his desire.  Will repeat orthostatic vital signs in the morning and if continues to be orthostatic or symptomatic would recommend returning back to 15 mg 3 times daily per cardiology's recommendation.   Denies headache, no visual changes, no chest pain, no palpitations, no shortness of breath, no abdominal pain, no fever/chills/night sweats, no nausea/vomiting/diarrhea, no focal weakness, no fatigue, no paresthesias.  No acute events overnight per nursing staff.  Objective: Vitals:   12/23/23 1622 12/23/23 2043 12/23/23 2346 12/24/23 0341  BP: (!) 94/41 (!) 111/44 (!) 115/46 (!) 118/51  Pulse: 67 66 72 74  Resp: 15 18 18 18   Temp: 97.8 F (36.6 C) 98.7 F (37.1 C) 98.7 F (37.1 C) 98.9 F (37.2 C)  TempSrc: Axillary Oral Oral Oral  SpO2: 93% 95% 94% 95%  Weight:    (!) 149.6 kg  Height:        Intake/Output Summary (Last 24 hours) at 12/24/2023 1225 Last data filed  at 12/24/2023 1030 Gross per 24 hour  Intake 1785.08 ml  Output 1550 ml  Net 235.08 ml   Filed Weights   12/21/23 1547 12/23/23 0500 12/24/23 0341  Weight: (!) 157.9 kg (!) (P) 146.4 kg (!) 149.6 kg    Examination:  Physical Exam: GEN: NAD, alert and oriented x 3, morbidly obese HEENT: NCAT, PERRL, EOMI, sclera clear, MMM PULM: CTAB w/o wheezes/crackles, normal respiratory effort, on 2 L nasal cannula which is his baseline CV: RRR w/o M/G/R GI: abd soft, NTND, NABS, no R/G/M MSK: Trace bilateral lower extremity peripheral edema, moves all extremities independently, no tremors noted on physical exam today NEURO: CN II-XII intact, no focal deficits, sensation to light touch intact PSYCH: normal mood/affect Integumentary: No concerning rashes/lesions/wounds noted on exposed skin surfaces    Data Reviewed: I have personally reviewed following labs and imaging studies  CBC: Recent Labs  Lab 12/21/23 1600 12/22/23 0445  WBC 7.7 7.0  NEUTROABS 6.1  --   HGB 14.2 11.8*  HCT 43.7 36.7*  MCV 98.9 98.4  PLT 185 151   Basic Metabolic Panel: Recent Labs  Lab 12/21/23 1600 12/22/23 0445 12/23/23 0224 12/24/23 0631  NA 137 135 137 136  K 4.1 3.6 4.8 3.9  CL 96* 97* 102 99  CO2 26 26 23 26   GLUCOSE 128* 103* 124* 99  BUN 22 20 24* 21  CREATININE 1.47* 1.42* 1.61* 1.43*  CALCIUM  9.7 9.0 8.7* 8.6*  MG 2.1 2.0 2.3  --   PHOS  --  3.0  --   --    GFR: Estimated Creatinine Clearance: 61.7 mL/min (A) (by C-G formula based on  SCr of 1.43 mg/dL (H)). Liver Function Tests: Recent Labs  Lab 12/21/23 1600  AST 31  ALT 18  ALKPHOS 86  BILITOT 1.0  PROT 7.6  ALBUMIN  3.5   No results for input(s): "LIPASE", "AMYLASE" in the last 168 hours. No results for input(s): "AMMONIA" in the last 168 hours. Coagulation Profile: No results for input(s): "INR", "PROTIME" in the last 168 hours. Cardiac Enzymes: No results for input(s): "CKTOTAL", "CKMB", "CKMBINDEX", "TROPONINI" in the  last 168 hours. BNP (last 3 results) No results for input(s): "PROBNP" in the last 8760 hours. HbA1C: No results for input(s): "HGBA1C" in the last 72 hours. CBG: No results for input(s): "GLUCAP" in the last 168 hours. Lipid Profile: No results for input(s): "CHOL", "HDL", "LDLCALC", "TRIG", "CHOLHDL", "LDLDIRECT" in the last 72 hours. Thyroid  Function Tests: No results for input(s): "TSH", "T4TOTAL", "FREET4", "T3FREE", "THYROIDAB" in the last 72 hours. Anemia Panel: No results for input(s): "VITAMINB12", "FOLATE", "FERRITIN", "TIBC", "IRON", "RETICCTPCT" in the last 72 hours. Sepsis Labs: Recent Labs  Lab 12/21/23 2028 12/21/23 2151  LATICACIDVEN 2.2* 1.7    Recent Results (from the past 240 hours)  MRSA Next Gen by PCR, Nasal     Status: Abnormal   Collection Time: 12/22/23  3:24 PM   Specimen: Nasal Mucosa; Nasal Swab  Result Value Ref Range Status   MRSA by PCR Next Gen DETECTED (A) NOT DETECTED Final    Comment: RESULT CALLED TO, READ BACK BY AND VERIFIED WITH: RN Lafonda Piety [id number] @[id number]  FH (NOTE) The GeneXpert MRSA Assay (FDA approved for NASAL specimens only), is one component of a comprehensive MRSA colonization surveillance program. It is not intended to diagnose MRSA infection nor to guide or monitor treatment for MRSA infections. Test performance is not FDA approved in patients less than 35 years old. Performed at [facility], [address] [place], [place] [address]          Radiology Studies: MR BRAIN W WO CONTRAST Result Date: 12/23/2023 CLINICAL DATA:  Parkinsonian syndrome EXAM: MRI HEAD WITHOUT AND WITH CONTRAST TECHNIQUE: Multiplanar, multiecho pulse sequences of the brain and surrounding structures were obtained without and with intravenous contrast. CONTRAST:  10mL GADAVIST  GADOBUTROL  1 MMOL/ML IV SOLN COMPARISON:  None Available. FINDINGS: Brain: No acute infarct, mass effect or extra-axial collection. No acute or chronic hemorrhage. There  is multifocal hyperintense T2-weighted signal within the white matter. Generalized volume loss. The midline structures are normal. There is no abnormal contrast enhancement. Vascular: Normal flow voids. Skull and upper cervical spine: Normal calvarium and skull base. Visualized upper cervical spine and soft tissues are normal. Sinuses/Orbits:No paranasal sinus fluid levels or advanced mucosal thickening. No mastoid or middle ear effusion. Normal orbits. IMPRESSION: 1. No acute intracranial abnormality. 2. Findings of chronic small vessel ischemia and volume loss. Electronically Signed   By: Juanetta Nordmann M.D.   On: 12/23/2023 19:58        Scheduled Meds:  Chlorhexidine  Gluconate Cloth  6 each Topical Daily   digoxin   0.25 mg Oral Daily   fludrocortisone   0.1 mg Oral Daily   gabapentin   300 mg Oral BID   midodrine   10 mg Oral TID WC   mupirocin  ointment  1 Application Nasal BID   pantoprazole   40 mg Oral Daily   rivaroxaban   20 mg Oral QPM   sodium chloride  flush  3 mL Intravenous Q12H   Continuous Infusions:  sodium chloride  75 mL/hr at 12/24/23 0931     LOS: 1  day    Time spent: 52 minutes spent on 12/24/2023 caring for this patient face-to-face including chart review, ordering labs/tests, documenting, discussion with nursing staff, consultants, updating family and interview/physical exam    Rema Care [place], DO [facility] Available via Epic secure chat 7am-7pm After these hours, please refer to coverage provider listed on [email or web address] 12/24/2023, 12:25 PM

## 2023-12-24 NOTE — Plan of Care (Signed)

## 2023-12-24 NOTE — Progress Notes (Signed)
 Mobility Specialist: Progress Note   12/24/23 1226  Orthostatic Lying   BP- Lying 117/42  Orthostatic Sitting  BP- Sitting 113/65  Orthostatic Standing at 0 minutes  BP- Standing at 0 minutes 116/78  Mobility  Activity Transferred from bed to chair  Level of Assistance +2 (takes two people)  Press photographer wheel walker  Activity Response Tolerated well  Mobility Referral Yes  Mobility visit 1 Mobility  Mobility Specialist Start Time (ACUTE ONLY) 1028  Mobility Specialist Stop Time (ACUTE ONLY) 1057  Mobility Specialist Time Calculation (min) (ACUTE ONLY) 29 min    Pt was agreeable to mobility session - received in bed. ModA+1 for bed mobility to assist with scooting and trunk elevation. MS donned abdominal binder once pt EOB and compression stockings while pt was lying supine. C/o dizziness sitting EOB but subsided quickly. MinA+2 for STS. No complaints of dizziness while standing. MinA+1 for stand pivot to chair. Left in chair with all needs met, call bell in reach.   Deloria Fetch Mobility Specialist Please contact via SecureChat or Rehab office at [phone]

## 2023-12-25 DIAGNOSIS — R55 Syncope and collapse: Secondary | ICD-10-CM | POA: Diagnosis not present

## 2023-12-25 LAB — BASIC METABOLIC PANEL WITH GFR
Anion gap: 9 (ref 5–15)
BUN: 18 mg/dL (ref 8–23)
CO2: 25 mmol/L (ref 22–32)
Calcium: 8.6 mg/dL — ABNORMAL LOW (ref 8.9–10.3)
Chloride: 106 mmol/L (ref 98–111)
Creatinine, Ser: 1.41 mg/dL — ABNORMAL HIGH (ref 0.61–1.24)
GFR, Estimated: 51 mL/min — ABNORMAL LOW (ref >60)
Glucose, Bld: 105 mg/dL — ABNORMAL HIGH (ref 70–99)
Potassium: 4.8 mmol/L (ref 3.5–5.1)
Sodium: 140 mmol/L (ref 135–145)

## 2023-12-25 NOTE — Plan of Care (Signed)

## 2023-12-25 NOTE — Progress Notes (Signed)
 Physical Therapy Treatment Patient Details Name: [name] MRN: [id number] DOB: 1946-06-09 Today's Date: 12/25/2023   History of Present Illness 78 y.o. male presents to [facility] 12/21/23 with syncope and lethargy. Admitted with near syncopal episode. Multiple prior admits for similar episodes. PMHx: cirrhosis, atrial fibrillation on Xarelto , chronic HFpEF, chronic orthostatic hypotension, chronic anemia    PT Comments  Pt received in supine, agreeable to therapy session with good participation and improved tolerance for transfer and gait training with BLE compression and loosely fitted abdominal binder. Pt needing up to +2 modA for bed mobility using hospital bed features and minA for transfers from very elevated surface height to RW. Pt fatigues and needed seated break after standing orthostatic BP reading, but able to perform short household distance gait trial with close chair follow after resting, and pt motivated to sit up in recliner a couple hours post-exertion, RN notified.  Orthostatic Lying   BP- Lying 126/52  Pulse- Lying 74  Orthostatic Sitting  BP- Sitting 137/60  Pulse- Sitting 89  Orthostatic Standing at 0 minutes  BP- Standing at 0 minutes 120/71 "My legs are burning, I need to rest" after standing ~2 mins for reading.  Pulse- Standing at 0 minutes 96     If plan is discharge home, recommend the following: A lot of help with walking and/or transfers;A lot of help with bathing/dressing/bathroom;Assistance with cooking/housework;Assist for transportation;Help with stairs or ramp for entrance (+2 safety for amb)   Can travel by private vehicle     No  Equipment Recommendations  None recommended by PT    Recommendations for Other Services       Precautions / Restrictions Precautions Precautions: Fall;Other (comment) Recall of Precautions/Restrictions: Intact Precaution/Restrictions Comments: orthostatic hypotension Required Braces or Orthoses: Other Brace Other Brace:  abdominal binder, compression stockings; pt does not tolerate abd binder to be snugly fitted/correctly placed, can trial without binder next session but needs compression socks. Restrictions Weight Bearing Restrictions Per Provider Order: No     Mobility  Bed Mobility Overal bed mobility: Needs Assistance Bed Mobility: Supine to Sit     Supine to sit: Mod assist, +2 for safety/equipment, HOB elevated, +2 for physical assistance, Used rails     General bed mobility comments: pt requesting BLE and trunk support, discussed log roll but pt not able to tolerate fully rolling onto his R side 2/2 body habitus and discomfort; partial log roll technique    Transfers Overall transfer level: Needs assistance Equipment used: Rolling walker (2 wheels) Transfers: Sit to/from Stand, Bed to chair/wheelchair/BSC Sit to Stand: +2 safety/equipment, Min assist, From elevated surface           General transfer comment: light minA +2 safety for elevated bed>RW x2    Ambulation/Gait Ambulation/Gait assistance: Min assist, +2 safety/equipment Gait Distance (Feet): 25 Feet Assistive device: Rolling walker (2 wheels) (chair follow) Gait Pattern/deviations: Step-to pattern, Trunk flexed, Decreased stride length Gait velocity: grossly <0.3 m/s Gait velocity interpretation: <1.31 ft/sec, indicative of household ambulator   General Gait Details: BP more WFL and only symptoms pt expresses in standing/during gait trial are BLE "burning"/pain, pt able to progress standing/gait tolerance on RA with very close chair follow for safety and pt sitting quickly as he fatigues. Abd binder and compression socks in place throughout which seem to help today; cues for forward gaze/trunk extension throughout as pt tends to significant cervical flexion and needed reminder that this compromises his respiratory status.     Balance Overall balance assessment:  Needs assistance, Mild deficits observed, not formally  tested Sitting-balance support: No upper extremity supported, Feet supported Sitting balance-[name]: Fair     Standing balance support: Bilateral upper extremity supported, During functional activity, Reliant on assistive device for balance Standing balance-[name]: Poor Standing balance comment: reliant on RW and external support                            Communication Communication Communication: No apparent difficulties  Cognition Arousal: Alert Behavior During Therapy: WFL for tasks assessed/performed, Anxious   PT - Cognitive impairments: Safety/Judgement                         Following commands: Intact      Cueing Cueing Techniques: Verbal cues, Tactile cues  Exercises Other Exercises Other Exercises: cues for pursed-lip breathing with trunk and shoulder extension to work on improved pulmonary clearance and chest extension    General Comments General comments (skin integrity, edema, etc.): compression socks donned prior to transfer to EOB, pt has foam dressings over his heels but reports it is not painful when donning socks, only when sock "bites into" back of his L knee so kept L sock folded down ~1 inch on L side      Pertinent Vitals/Pain Pain Assessment Pain Assessment: Faces Faces Pain Scale: Hurts little more Pain Location: BLE (medial thighs/radiating down into his feet) with prolonged standing and with gait Pain Descriptors / Indicators: Discomfort, Grimacing, Burning Pain Intervention(s): Limited activity within patient's tolerance, Monitored during session, Repositioned    Home Living                          Prior Function            PT Goals (current goals can now be found in the care plan section) Acute Rehab PT Goals Patient Stated Goal: to get better PT Goal Formulation: With patient Time For Goal Achievement: 01/06/24 Progress towards PT goals: Progressing toward goals    Frequency    Min  2X/week      PT Plan      Co-evaluation              AM-PAC PT "6 Clicks" Mobility   Outcome Measure  Help needed turning from your back to your side while in a flat bed without using bedrails?: Total Help needed moving from lying on your back to sitting on the side of a flat bed without using bedrails?: A Lot (hospital bed is what pt uses at baseline) Help needed moving to and from a bed to a chair (including a wheelchair)?: A Lot Help needed standing up from a chair using your arms (e.g., wheelchair or bedside chair)?: A Lot Help needed to walk in hospital room?: A Lot (chair follow) Help needed climbing 3-5 steps with a railing? : Total 6 Click Score: 10    End of Session Equipment Utilized During Treatment: Gait belt;Other (comment) (abd binder when standing, bil compression socks) Activity Tolerance: Patient tolerated treatment well Patient left: in chair;with call bell/phone within reach;with chair alarm set;Other (comment) (legs reclined) Nurse Communication: Mobility status PT Visit Diagnosis: Unsteadiness on feet (R26.81);Other abnormalities of gait and mobility (R26.89);Muscle weakness (generalized) (M62.81)     Time: 1914-7829 PT Time Calculation (min) (ACUTE ONLY): 38 min  Charges:    $Gait Training: 8-22 mins $Therapeutic Activity: 23-37 mins PT General  Charges $$ ACUTE PT VISIT: 1 Visit                     Adelaide Pfefferkorn P., PTA Acute Rehabilitation Services Secure Chat Preferred 9a-5:30pm Office: [phone]    [name] [facility] 12/25/2023, 2:17 PM

## 2023-12-25 NOTE — Care Management Important Message (Signed)
 Important Message  Patient Details  Name: [name] MRN: [id number] Date of Birth: 10-26-1945   Important Message Given:  Yes - Medicare IM     Wynonia Hedges 12/25/2023, 3:25 PM

## 2023-12-25 NOTE — Progress Notes (Signed)
   12/25/23 1100  Orthostatic Lying   BP- Lying 126/52  Pulse- Lying 74  Orthostatic Sitting  BP- Sitting 137/60  Pulse- Sitting 89  Orthostatic Standing at 0 minutes  BP- Standing at 0 minutes 120/71  Pulse- Standing at 0 minutes 96   With knee-high BLE compression donned and abd binder donned once standing; c/o "legs burning" in standing but no other orthostatic symptoms; SpO2 92% and above on RA with activity.

## 2023-12-25 NOTE — Progress Notes (Signed)
 Frank NOTE    [name]  QIH:[id number] DOB: 07-13-1946 DOA: 12/21/2023 PCP: Cooper, [name] is a 78 y.o. male with past medical history significant for chronic diastolic congestive heart failure, paroxysmal atrial fibrillation on Xarelto , history of orthostatic hypotension on midodrine /Florinef , anemia of chronic medical disease, OSA on CPAP, history of cirrhosis, morbid obesity who presented to [facility] ED on 12/21/2023 by recommendation of cardiologist from MRI due to dizziness and near syncopal episode.  Patient with multiple hospitalizations over the last several months for similar episodes with ongoing orthostatic hypotension and near syncope.  Recently seen in outpatient cardiology office with concern of possible amyloidosis with cardiac MRI ordered.  After obtaining MRI, patient with difficulty getting off the table and was subsequently sent to the ED for further evaluation.  In the ED, temperature 98.1 F, HR 80, RR 17, BP 118/73, SpO2 99% on 2 L nasal cannula.  WBC 7.7, hemoglobin 14.2, platelet count 185.  Sodium 137, potassium 4.1, chloride 96, CO2 26, glucose 128, BUN 22, Cram 1.47.  AST 31, ALT 18, total bilirubin 1.0.  BNP 252.2.  High sensitive troponin 41> 39.  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  Cardiology was consulted.  Patient given 500 mL bolus of NS.  TRH consulted for admission for  further evaluation management of recurrent weakness, presyncope concerning for likely history of underlying chronic orthostatic hypotension.  Assessment & Plan:   Near syncopal episode Hx orthostatic hypotension Patient presenting to the ED by direction of cardiology for near syncopal episode, weakness while performing cardiac MRI yesterday.  Patient with multiple hospitalizations over the last 6 months for similar episodes.  Recently seen by cardiology in the outpatient office with concerns for possible amyloidosis and underwent cardiac MRI on 12/21/2023 that was negative.  Also no valvular issues noted on MRI.  Patient has known history of atrial fibrillation that is well-controlled and no other arrhythmias identified.  Seen by cardiology, Dr. Maximo Spar; with recommendation of up titration of midodrine  and fludrocortisone . -- Midodrine  increased to 15 mg p.o. TID by cardiology; reduced back to 10mg  TID per patient request (4/24) -- Fludrocortisone  increased to 0.1 mg p.o. daily -- Hold home Lasix  -- PT/OT recommend SNF placement, TOC following -- Cardiology signed off, recommend outpatient follow-up with Dr. Avanell Bob  Chronic diastolic congestive heart failure, compensated Recent TTE 11/08/2023 with LVEF 55%, LV with normal function, no regional wall motion normalities, indeterminate diastolic parameters, no aortic valve stenosis, IVC normal in size. -- Outpatient follow-up with cardiology  Tremors: Resolved Patient reports worsening tremors.  History of neuropathy.  Chronic microvascular ischemic changes noted on previous CT head.  MRI brain with and without contrast unrevealing other than chronic microvascular ischemic changes. -- Outpatient follow-up with neurology PRN  Paroxysmal atrial fibrillation -- Xarelto  20 mg p.o. daily  Cirrhosis, compensated -- Continue to hold home diuretic  Neuropathy -- Gabapentin  300 mg p.o. twice daily  Chronic hypoxic respiratory failure OSA -- On 2 L nasal  cannula at baseline -- Continue nocturnal CPAP  Morbid obesity, class III Body mass index is  50.88 kg/m.   DVT prophylaxis: rivaroxaban  (XARELTO ) tablet 20 mg Start: 12/22/23 1800 rivaroxaban  (XARELTO ) tablet 20 mg    Code Status: Do not attempt resuscitation (DNR) PRE-ARREST INTERVENTIONS DESIRED Family Communication: No family present at bedside this morning  Disposition Plan:  Level of care: Med-Surg Status is: Inpatient Remains inpatient appropriate because: Pending SNF placement      Consultants:  cardiology: Signed off 4/22  Procedures:  None  Antimicrobials:  none    Subjective: Patient seen examined bedside, lying in bed.  Eating breakfast.  "Fogginess/tremors" remain resolved.  Repeat orthostatic vital signs yesterday afternoon unremarkable.  Feels improved.  Desires to go to rehab in [place].  No other specific questions, concerns or complaints at this time. Denies headache, no visual changes, no chest pain, no palpitations, no shortness of breath, no abdominal pain, no fever/chills/night sweats, no nausea/vomiting/diarrhea, no focal weakness, no fatigue, no paresthesias.  No acute events overnight per nursing staff.  Objective: Vitals:   12/25/23 0500 12/25/23 0617 12/25/23 0619 12/25/23 0743  BP:  123/66 (!) 121/56 (!) 126/51  Pulse:  86 69 60  Resp:  18  17  Temp:    (!) 97.4 F (36.3 C)  TempSrc:    Oral  SpO2:  91% 96% 95%  Weight: (!) 151.8 kg     Height:        Intake/Output Summary (Last 24 hours) at 12/25/2023 1013 Last data filed at 12/25/2023 0700 Gross per 24 hour  Intake 1898.15 ml  Output 2300 ml  Net -401.85 ml   Filed Weights   12/23/23 0500 12/24/23 0341 12/25/23 0500  Weight: (!) (P) 146.4 kg (!) 149.6 kg (!) 151.8 kg    Examination:  Physical Exam: GEN: NAD, alert and oriented x 3, morbidly obese HEENT: NCAT, PERRL, EOMI, sclera clear, MMM PULM: CTAB w/o wheezes/crackles, normal respiratory effort, on 2 L nasal cannula which is  his baseline CV: RRR w/o M/G/R GI: abd soft, NTND, NABS, no R/G/M MSK: Trace bilateral lower extremity peripheral edema, moves all extremities independently, no tremors noted on physical exam today NEURO: CN II-XII intact, no focal deficits, sensation to light touch intact PSYCH: normal mood/affect Integumentary: No concerning rashes/lesions/wounds noted on exposed skin surfaces    Data Reviewed: I have personally reviewed following labs and imaging studies  CBC: Recent Labs  Lab 12/21/23 1600 12/22/23 0445  WBC 7.7 7.0  NEUTROABS 6.1  --   HGB 14.2 11.8*  HCT 43.7 36.7*  MCV 98.9 98.4  PLT 185 151   Basic Metabolic Panel: Recent Labs  Lab 12/21/23 1600 12/22/23 0445 12/23/23 0224 12/24/23 0631 12/25/23 0709  NA 137 135 137 136 140  K 4.1 3.6 4.8 3.9 4.8  CL 96* 97* 102 99 106  CO2 26 26 23 26 25   GLUCOSE 128* 103* 124* 99 105*  BUN 22 20 24* 21 18  CREATININE 1.47* 1.42* 1.61* 1.43* 1.41*  CALCIUM  9.7 9.0 8.7* 8.6* 8.6*  MG 2.1 2.0 2.3  --   --   PHOS  --  3.0  --   --   --    GFR: Estimated Creatinine Clearance: 63.2 mL/min (A) (by C-G formula based on SCr of 1.41 mg/dL (H)). Liver Function Tests: Recent Labs  Lab 12/21/23 1600  AST 31  ALT 18  ALKPHOS 86  BILITOT 1.0  PROT 7.6  ALBUMIN  3.5   No results for input(s): "LIPASE", "AMYLASE" in the last 168 hours. No results for input(s): "AMMONIA" in the last  168 hours. Coagulation Profile: No results for input(s): "INR", "PROTIME" in the last 168 hours. Cardiac Enzymes: No results for input(s): "CKTOTAL", "CKMB", "CKMBINDEX", "TROPONINI" in the last 168 hours. BNP (last 3 results) No results for input(s): "PROBNP" in the last 8760 hours. HbA1C: No results for input(s): "HGBA1C" in the last 72 hours. CBG: No results for input(s): "GLUCAP" in the last 168 hours. Lipid Profile: No results for input(s): "CHOL", "HDL", "LDLCALC", "TRIG", "CHOLHDL", "LDLDIRECT" in the last 72 hours. Thyroid  Function  Tests: No results for input(s): "TSH", "T4TOTAL", "FREET4", "T3FREE", "THYROIDAB" in the last 72 hours. Anemia Panel: No results for input(s): "VITAMINB12", "FOLATE", "FERRITIN", "TIBC", "IRON", "RETICCTPCT" in the last 72 hours. Sepsis Labs: Recent Labs  Lab 12/21/23 2028 12/21/23 2151  LATICACIDVEN 2.2* 1.7    Recent Results (from the past 240 hours)  MRSA Next Gen by PCR, Nasal     Status: Abnormal   Collection Time: 12/22/23  3:24 PM   Specimen: Nasal Mucosa; Nasal Swab  Result Value Ref Range Status   MRSA by PCR Next Gen DETECTED (A) NOT DETECTED Final    Comment: RESULT CALLED TO, READ BACK BY AND VERIFIED WITH: RN Lafonda Piety [phone] @[phone]  FH (NOTE) The GeneXpert MRSA Assay (FDA approved for NASAL specimens only), is one component of a comprehensive MRSA colonization surveillance program. It is not intended to diagnose MRSA infection nor to guide or monitor treatment for MRSA infections. Test performance is not FDA approved in patients less than 70 years old. Performed at [facility], [address], [place], [place] [address]          Radiology Studies: MR BRAIN W WO CONTRAST Result Date: 12/23/2023 CLINICAL DATA:  Parkinsonian syndrome EXAM: MRI HEAD WITHOUT AND WITH CONTRAST TECHNIQUE: Multiplanar, multiecho pulse sequences of the brain and surrounding structures were obtained without and with intravenous contrast. CONTRAST:  10mL GADAVIST  GADOBUTROL  1 MMOL/ML IV SOLN COMPARISON:  None Available. FINDINGS: Brain: No acute infarct, mass effect or extra-axial collection. No acute or chronic hemorrhage. There is multifocal hyperintense T2-weighted signal within the white matter. Generalized volume loss. The midline structures are normal. There is no abnormal contrast enhancement. Vascular: Normal flow voids. Skull and upper cervical spine: Normal calvarium and skull base. Visualized upper cervical spine and soft tissues are normal. Sinuses/Orbits:No paranasal sinus  fluid levels or advanced mucosal thickening. No mastoid or middle ear effusion. Normal orbits. IMPRESSION: 1. No acute intracranial abnormality. 2. Findings of chronic small vessel ischemia and volume loss. Electronically Signed   By: Juanetta Nordmann M.D.   On: 12/23/2023 19:58        Scheduled Meds:  Chlorhexidine  Gluconate Cloth  6 each Topical Daily   digoxin   0.25 mg Oral Daily   fludrocortisone   0.1 mg Oral Daily   gabapentin   300 mg Oral BID   midodrine   10 mg Oral TID WC   mupirocin  ointment  1 Application Nasal BID   pantoprazole   40 mg Oral Daily   rivaroxaban   20 mg Oral QPM   sodium chloride  flush  3 mL Intravenous Q12H   Continuous Infusions:     LOS: 2 days    Time spent: 45 minutes spent on 12/25/2023 caring for this patient face-to-face including chart review, ordering labs/tests, documenting, discussion with nursing staff, consultants, updating family and interview/physical exam    Rema Care [place], DO [facility] Available via Epic secure chat 7am-7pm After these hours, please refer to coverage provider listed on [email or web address] 12/25/2023, 10:13 AM

## 2023-12-25 NOTE — TOC Progression Note (Addendum)
 Transition of Care [facility]) - Progression Note    Patient Details  Name: [name] MRN: [id number] Date of Birth: September 26, 1945  Transition of Care [facility] CM/SW Contact  Amera Banos A [place], LCSW Phone Number: 12/25/2023, 3:59 PM  Clinical Narrative:     Update 1647: Auth approved Dates:  4/26-4/29  Plan Auth ID# [id number]  Provider and facility notified.   Pt's authorization pending for insurance.  [name]#[id number]   If pt's [name] gets approved, can DC tomorrow [facility].    TOC will continue to follow.   Expected Discharge Plan: Skilled Nursing Facility Barriers to Discharge: Continued Medical Work up, SNF Pending bed offer, English as a second language teacher  Expected Discharge Plan and Services In-house Referral: Clinical Social Work   Post Acute Care Choice: Skilled Nursing Facility Living arrangements for the past 2 months: Single Family Home                                       Social Determinants of Health (SDOH) Interventions SDOH Screenings   Food Insecurity: No Food Insecurity (12/22/2023)  Housing: Low Risk  (12/22/2023)  Transportation Needs: No Transportation Needs (12/22/2023)  Utilities: Not At Risk (12/22/2023)  Financial Resource Strain: Low Risk  (09/15/2023)   Received from [facility]  Physical Activity: Sufficiently Active (01/11/2023)   Received from [facility]  Social Connections: Moderately Isolated (12/22/2023)  Stress: No Stress Concern Present (10/08/2023)   Received from [facility]  Tobacco Use: Medium Risk (12/21/2023)    Readmission Risk Interventions    11/09/2023   10:58 AM  Readmission Risk Prevention Plan  Transportation Screening Complete  Home Care Screening Complete  Medication Review (RN CM) Complete

## 2023-12-26 DIAGNOSIS — R55 Syncope and collapse: Secondary | ICD-10-CM | POA: Diagnosis not present

## 2023-12-26 MED ORDER — HYDROCODONE-ACETAMINOPHEN 10-325 MG PO TABS: 1.0000 | ORAL_TABLET | Freq: Four times a day (QID) | ORAL | 0 refills | Status: DC | PRN

## 2023-12-26 MED ORDER — POLYETHYLENE GLYCOL 3350 17 G PO PACK: 17.0000 g | PACK | Freq: Every day | ORAL | Status: DC

## 2023-12-26 MED ORDER — FLUDROCORTISONE ACETATE 0.1 MG PO TABS
0.1000 mg | ORAL_TABLET | Freq: Every day | ORAL | Status: DC
Start: 2023-12-26 — End: 2024-02-11

## 2023-12-26 NOTE — Discharge Summary (Addendum)
 Physician Discharge Summary  [name]:[id number] DOB: [birth year] DOA: 12/21/2023  PCP: Medicine, [facility]  Admit date: 12/21/2023 Discharge date: 12/26/2023  Admitted From: Home Disposition: [facility]  Recommendations for Outpatient Follow-up:  Follow up with PCP in 1-2 weeks Outpatient follow-up with cardiology, Dr. Avanell Cooper Fludrocortisone  increased to 0.1 mg p.o. daily, may consider further up titration if needed. Continue to encourage use of compression stockings  Discharge Condition: Stable CODE STATUS: DNR Diet recommendation: Heart healthy diet  History of present illness:  [name] is a 78 y.o. male with past medical history significant for chronic diastolic congestive heart failure, paroxysmal atrial fibrillation on Xarelto , history of orthostatic hypotension on midodrine /Florinef , anemia of chronic medical disease, OSA on CPAP, history of cirrhosis, morbid obesity who presented to [facility] ED on 12/21/2023 by recommendation of cardiologist from MRI due to dizziness and near syncopal episode.  Patient with multiple hospitalizations over the last several months for similar episodes with ongoing orthostatic hypotension and near syncope.  Recently seen in outpatient cardiology office with concern of possible amyloidosis with cardiac MRI ordered.  After obtaining MRI, patient with difficulty getting off the table and was subsequently sent to the ED for further evaluation.   In the ED, temperature 98.1 F, HR 80, RR 17, BP 118/73, SpO2 99% on 2 L nasal cannula.  WBC 7.7, hemoglobin 14.2, platelet count 185.  Sodium 137, potassium 4.1, chloride 96, CO2 26, glucose 128, BUN 22, Cram 1.47.  AST 31, ALT 18, total bilirubin 1.0.  BNP 252.2.  High sensitive troponin 41> 39.  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  Cardiology was consulted.  Patient given 500 mL bolus of NS.  TRH consulted for admission for further evaluation management of recurrent weakness, presyncope concerning for likely history of underlying chronic orthostatic hypotension.  Hospital course:  Near syncopal episode Hx orthostatic hypotension Patient presenting to the ED by direction of cardiology for near syncopal episode, weakness while performing cardiac MRI yesterday.  Patient with multiple hospitalizations over the last 6 months for similar episodes.  Recently seen by cardiology in the outpatient office with concerns for possible amyloidosis and underwent cardiac MRI on 12/21/2023 that was negative.  Also no valvular issues noted on MRI.  Patient has known history of atrial fibrillation that is well-controlled and no other arrhythmias identified.  Seen by cardiology, Dr. Maximo Cooper; with recommendation of up titration of midodrine  and fludrocortisone .  Midodrine  was initially increased to 50 mg p.o. 3 times daily by cardiology but reduce back to 10 mg p.o. 3 times daily due to reported side effects from patient.  Fludrocortisone  increased to 0.1 mg p.o. daily.  Symptoms of orthostatic hypotension improved/resolved.  May consider further up titration of fludrocortisone  if needed.  Outpatient follow-up with PCP/cardiology, Dr. Avanell Cooper.   Chronic diastolic congestive heart failure, compensated Recent TTE 11/08/2023 with LVEF 55%, LV with normal function, no regional wall motion normalities, indeterminate diastolic parameters, no aortic valve stenosis, IVC  normal in size. Outpatient follow-up with cardiology   Tremors: Resolved Patient reports worsening tremors.  History of neuropathy.  Chronic microvascular ischemic  changes noted on previous CT head.  MRI brain with and without contrast unrevealing other than chronic microvascular ischemic changes. Outpatient follow-up with neurology PRN   Paroxysmal atrial fibrillation Xarelto  20 mg p.o. daily   Cirrhosis, compensated Stable, outpatient follow-up with GI as needed.   Neuropathy Gabapentin  300 mg p.o. twice daily   Chronic hypoxic respiratory failure OSA On 2 L nasal cannula at baseline, Continue nocturnal CPAP   Morbid obesity, class III Body mass index is 50.88 kg/m.  Discharge Diagnoses:  Principal Problem:   Near syncope Active Problems:   Chronic diastolic CHF (congestive heart failure) (HCC)   OSA on CPAP   Other cirrhosis of liver (HCC)   Paroxysmal atrial fibrillation (HCC)   Chronic respiratory failure with hypoxia (HCC)   Orthostatic hypotension dysautonomic syndrome    Discharge Instructions  Discharge Instructions     Call MD for:  difficulty breathing, headache or visual disturbances   Complete by: As directed    Call MD for:  extreme fatigue   Complete by: As directed    Call MD for:  persistant dizziness or light-headedness   Complete by: As directed    Call MD for:  persistant nausea and vomiting   Complete by: As directed    Call MD for:  severe uncontrolled pain   Complete by: As directed    Call MD for:  temperature >100.4   Complete by: As directed    Diet - low sodium heart healthy   Complete by: As directed    Increase activity slowly   Complete by: As directed       Allergies as of 12/26/2023       Reactions   Bee Venom Anaphylaxis, Other (See Comments)   Noted from [place]. honey bee venom   Nickel Rash, Other (See Comments)   "Breaks me out"        Medication List     STOP taking these medications    OVER THE  COUNTER MEDICATION       TAKE these medications    acetaminophen  500 MG tablet Commonly known as: TYLENOL  Take 1,000 mg by mouth as needed for mild pain (pain score 1-3) or headache.   alum & mag hydroxide-simeth 200-200-20 MG/5ML suspension Commonly known as: MAALOX/MYLANTA Take 15 mLs by mouth every 6 (six) hours as needed for indigestion or heartburn.   CENTRUM ADULTS PO Take 1 tablet by mouth daily.   digoxin  0.25 MG tablet Commonly known as: LANOXIN  Take 1 tablet (0.25 mg total) by mouth daily.   Ensure Max Protein Liqd Take 330 mLs (11 oz total) by mouth 2 (two) times daily. What changed:  how much to take when to take this reasons to take this   fludrocortisone  0.1 MG tablet Commonly known as: FLORINEF  Take 1 tablet (0.1 mg total) by mouth daily.   gabapentin  300 MG capsule Commonly known as: NEURONTIN  Take 1 capsule (300 mg total) by mouth 2 (two) times daily.   HYDROcodone -acetaminophen  10-325 MG tablet Commonly known as: NORCO Take 1 tablet by mouth every 6 (six) hours as needed for moderate pain (pain score 4-6).   levalbuterol  45 MCG/ACT inhaler Commonly known as: XOPENEX  HFA Inhale 2 puffs into the lungs every 6 (six) hours as needed for wheezing or shortness of breath.   melatonin 3 MG Tabs tablet Take 2 tablets (6 mg total) by mouth at bedtime.   midodrine  10 MG tablet Commonly known as: PROAMATINE  Take 1 tablet (10 mg total) by  mouth 3 (three) times daily with meals. What changed:  how much to take when to take this   OXYGEN Inhale 2 L into the lungs continuous.   pantoprazole  40 MG tablet Commonly known as: PROTONIX  Take 1 tablet (40 mg total) by mouth daily.   promethazine  25 MG tablet Commonly known as: PHENERGAN  Take 25 mg by mouth every 6 (six) hours as needed for nausea or vomiting.   Theratears 0.25 % Soln Generic drug: Carboxymethylcellulose Sodium Place 1-2 drops into both eyes as needed (Dry, irritated eyes.).   Xarelto   20 MG Tabs tablet Generic drug: rivaroxaban  Take 20 mg by mouth every evening.        Contact information for follow-up providers     Medicine, [facility]. Schedule an appointment as soon as possible for a visit in 1 week(s).   Specialty: Family Medicine        Elmyra Haggard, MD. Schedule an appointment as soon as possible for a visit.   Specialty: Cardiology Contact information: [address] [place] [address] [phone]              Contact information for after-discharge care     Destination     [facility] .   Service: Skilled Nursing Contact information: [address] [place]  [address] [phone]                    Allergies  Allergen Reactions   Bee Venom Anaphylaxis and Other (See Comments)    Noted from [facility]. honey bee venom   Nickel Rash and Other (See Comments)    "Breaks me out"    Consultations: Cardiology   Procedures/Studies: MR BRAIN W WO CONTRAST Result Date: 12/23/2023 CLINICAL DATA:  Parkinsonian syndrome EXAM: MRI HEAD WITHOUT AND WITH CONTRAST TECHNIQUE: Multiplanar, multiecho pulse sequences of the brain and surrounding structures were obtained without and with intravenous contrast. CONTRAST:  10mL GADAVIST  GADOBUTROL  1 MMOL/ML IV SOLN COMPARISON:  None Available. FINDINGS: Brain: No acute infarct, mass effect or extra-axial collection. No acute or chronic hemorrhage. There is multifocal hyperintense T2-weighted signal within the white matter. Generalized volume loss. The midline structures are normal. There is no abnormal contrast enhancement. Vascular: Normal flow voids. Skull and upper cervical spine: Normal calvarium and skull base. Visualized upper cervical spine and soft tissues are normal. Sinuses/Orbits:No paranasal sinus fluid levels or advanced mucosal thickening. No mastoid or middle ear effusion. Normal orbits. IMPRESSION: 1.  No acute intracranial abnormality. 2. Findings of chronic small vessel ischemia and volume loss. Electronically Signed   By: Juanetta Nordmann M.D.   On: 12/23/2023 19:58   MR CARDIAC MORPHOLOGY W WO CONTRAST Result Date: 12/22/2023 CLINICAL DATA:  Cardiac amyloidosis suspected, further testing rule out Amyloidosis COMPARISON: Echo 11/07/23 EXAM: MR CARDIA MORPHOLOGY WITHOUT AND WITH CONTRAST; MR CARDIAC VELOCITY FLOW MAPPING TECHNIQUE: The patient was scanned on a 1.5 Tesla Siemens magnet. A dedicated cardiac coil was used. Functional imaging was done using TrueFisp sequences. 2,3, and 4 chamber views were done to assess for RWMA's. Modified Simpson's rule using a short axis stack was used to calculate an ejection fraction on a dedicated work [employer]. The patient received 10mL GADAVIST  GADOBUTROL  1 MMOL/ML IV SOLN. After 10 minutes inversion recovery sequences were used to assess for infiltration and scar tissue. Phase contrast velocity encoded images obtained x 2. This examination is tailored for evaluation cardiac anatomy and function and  provides very limited assessment of noncardiac structures, which are accordingly not evaluated during interpretation. If there is clinical concern for extracardiac pathology, further evaluation with CT imaging should be considered. FINDINGS: LEFT VENTRICLE: Left ventricular chamber size: Normal. Left ventricular wall thickness: Moderately increased, concentric. Maximal wall thickness 15 mm in the base. Normal left ventricular systolic function. LVEF = 59% There are no regional wall motion abnormalities. No myocardial edema, T2 = 51 msec Normal first pass perfusion. There is no post contrast delayed myocardial enhancement. Normal T1 myocardial nulling kinetics suggest against a diagnosis of cardiac amyloidosis. ECV = 32%, nonspecific elevation. RIGHT VENTRICLE: Normal right ventricular chamber size. Increased right ventricular wall thickness. Normal right  ventricular systolic function. RVEF = 55% There are no regional wall motion abnormalities. No post contrast delayed myocardial enhancement. ATRIA: Left atrium: Mildly dilated Right atrium: Normal size Aorta: Sinus of Valsalva likely upper normal range for age and BSA, 37 x 39 x 39 mm sinus to sinus technique. PERICARDIUM: Normal pericardium.  No pericardial effusion. OTHER: Bilateral pleural effusions with fluid in the fissures. Dilated IVC MEASUREMENTS: Qp/Qs: 0.87 VALVES: Aortic valve regurgitation: trivial, regurgitant fraction 3% Pulmonary valve regurgitation: Trivial, regurgitant fraction 2% Mitral valve regurgitation: Trivial, regurgitant fraction 6% Tricuspid valve regurgitation: Mild, regurgitant fraction 14% Left ventricle: LV male LV EF: 59% (normal 49-79%) Absolute volumes: LV EDV: (Normal 95-215 mL) LV ESV: 44mL (Normal 25-85 mL) LV SV: 63mL (Normal 61-145 mL) CO: 4.4L/min (Normal 3.4-7.8 L/min) Indexed volumes: CI: 1.6L/min/sq-m (Normal 1.8-4.2 L/min/sq-m) LV EDV: 12mL/sq-m (Normal 50-108 mL/sq-m) LV ESV: 34mL/sq-m (Normal 11-47 mL/sq-m) LV SV: 41mL/sq-m (Normal 33-72 mL/sq-m) Right ventricle: RV male RV EF:  55% (normal 51-80%) Absolute volumes: RV EDV: (Normal 109-217 mL) RV ESV: 49mL (Normal 23-91 mL) RV SV: 59mL (Normal 71-141 mL) CO: 4.2L/min (Normal 2.8-8.8 L/min) Indexed volumes: CI: 1.52L/min/sq-m (Normal 1.7-4.2 L/min/sq-m) RV EDV: 36mL/sq-m (Normal 58-109 mL/sq-m) RV ESV: 35mL/sq-m (Normal 12-46 mL/sq-m) RV SV: 29mL/sq-m (Normal 38-71 mL/sq-m) IMPRESSION: 1. Normal left chamber size and function, LVEF 59% with moderate concentric LVH. No delayed myocardial enhancement. No myocardial edema. Normal T1 myocardial nulling. Findings do not suggest an infiltrative process. 2.  Normal right ventricular chamber size and function, RVEF 55%. 3.  No hemodynamically significant valvular heart disease. Electronically Signed   By: Grady Lawman M.D.   On: 12/22/2023 11:25   MR CARDIAC  VELOCITY FLOW MAP Result Date: 12/22/2023 CLINICAL DATA:  Cardiac amyloidosis suspected, further testing rule out Amyloidosis COMPARISON: Echo 11/07/23 EXAM: MR CARDIA MORPHOLOGY WITHOUT AND WITH CONTRAST; MR CARDIAC VELOCITY FLOW MAPPING TECHNIQUE: The patient was scanned on a 1.5 Tesla Siemens magnet. A dedicated cardiac coil was used. Functional imaging was done using TrueFisp sequences. 2,3, and 4 chamber views were done to assess for RWMA's. Modified Simpson's rule using a short axis stack was used to calculate an ejection fraction on a dedicated work [employer], [employer]. The patient received 10mL GADAVIST  GADOBUTROL  1 MMOL/ML IV SOLN. After 10 minutes inversion recovery sequences were used to assess for infiltration and scar tissue. Phase contrast velocity encoded images obtained x 2. This examination is tailored for evaluation cardiac anatomy and function and provides very limited assessment of noncardiac structures, which are accordingly not evaluated during interpretation. If there is clinical concern for extracardiac pathology, further evaluation with CT imaging should be considered. FINDINGS: LEFT VENTRICLE: Left ventricular chamber size: Normal. Left ventricular wall thickness: Moderately increased, concentric. Maximal wall thickness 15 mm in the base. Normal left ventricular systolic  function. LVEF = 59% There are no regional wall motion abnormalities. No myocardial edema, T2 = 51 msec Normal first pass perfusion. There is no post contrast delayed myocardial enhancement. Normal T1 myocardial nulling kinetics suggest against a diagnosis of cardiac amyloidosis. ECV = 32%, nonspecific elevation. RIGHT VENTRICLE: Normal right ventricular chamber size. Increased right ventricular wall thickness. Normal right ventricular systolic function. RVEF = 55% There are no regional wall motion abnormalities. No post contrast delayed myocardial enhancement. ATRIA: Left atrium: Mildly dilated Right atrium:  Normal size Aorta: Sinus of Valsalva likely upper normal range for age and BSA, 37 x 39 x 39 mm sinus to sinus technique. PERICARDIUM: Normal pericardium.  No pericardial effusion. OTHER: Bilateral pleural effusions with fluid in the fissures. Dilated IVC MEASUREMENTS: Qp/Qs: 0.87 VALVES: Aortic valve regurgitation: trivial, regurgitant fraction 3% Pulmonary valve regurgitation: Trivial, regurgitant fraction 2% Mitral valve regurgitation: Trivial, regurgitant fraction 6% Tricuspid valve regurgitation: Mild, regurgitant fraction 14% Left ventricle: LV male LV EF: 59% (normal 49-79%) Absolute volumes: LV EDV: (Normal 95-215 mL) LV ESV: 44mL (Normal 25-85 mL) LV SV: 63mL (Normal 61-145 mL) CO: 4.4L/min (Normal 3.4-7.8 L/min) Indexed volumes: CI: 1.6L/min/sq-m (Normal 1.8-4.2 L/min/sq-m) LV EDV: 52mL/sq-m (Normal 50-108 mL/sq-m) LV ESV: 42mL/sq-m (Normal 11-47 mL/sq-m) LV SV: 29mL/sq-m (Normal 33-72 mL/sq-m) Right ventricle: RV male RV EF:  55% (normal 51-80%) Absolute volumes: RV EDV: (Normal 109-217 mL) RV ESV: 49mL (Normal 23-91 mL) RV SV: 59mL (Normal 71-141 mL) CO: 4.2L/min (Normal 2.8-8.8 L/min) Indexed volumes: CI: 1.52L/min/sq-m (Normal 1.7-4.2 L/min/sq-m) RV EDV: 49mL/sq-m (Normal 58-109 mL/sq-m) RV ESV: 40mL/sq-m (Normal 12-46 mL/sq-m) RV SV: 61mL/sq-m (Normal 38-71 mL/sq-m) IMPRESSION: 1. Normal left chamber size and function, LVEF 59% with moderate concentric LVH. No delayed myocardial enhancement. No myocardial edema. Normal T1 myocardial nulling. Findings do not suggest an infiltrative process. 2.  Normal right ventricular chamber size and function, RVEF 55%. 3.  No hemodynamically significant valvular heart disease. Electronically Signed   By: Grady Lawman M.D.   On: 12/22/2023 11:25   MR CARDIAC VELOCITY FLOW MAP Result Date: 12/22/2023 CLINICAL DATA:  Cardiac amyloidosis suspected, further testing rule out Amyloidosis COMPARISON: Echo 11/07/23 EXAM: MR CARDIA MORPHOLOGY WITHOUT AND WITH  CONTRAST; MR CARDIAC VELOCITY FLOW MAPPING TECHNIQUE: The patient was scanned on a 1.5 Tesla Siemens magnet. A dedicated cardiac coil was used. Functional imaging was done using TrueFisp sequences. 2,3, and 4 chamber views were done to assess for RWMA's. Modified Simpson's rule using a short axis stack was used to calculate an ejection fraction on a dedicated work [employer], [employer]. The patient received 10mL GADAVIST  GADOBUTROL  1 MMOL/ML IV SOLN. After 10 minutes inversion recovery sequences were used to assess for infiltration and scar tissue. Phase contrast velocity encoded images obtained x 2. This examination is tailored for evaluation cardiac anatomy and function and provides very limited assessment of noncardiac structures, which are accordingly not evaluated during interpretation. If there is clinical concern for extracardiac pathology, further evaluation with CT imaging should be considered. FINDINGS: LEFT VENTRICLE: Left ventricular chamber size: Normal. Left ventricular wall thickness: Moderately increased, concentric. Maximal wall thickness 15 mm in the base. Normal left ventricular systolic function. LVEF = 59% There are no regional wall motion abnormalities. No myocardial edema, T2 = 51 msec Normal first pass perfusion. There is no post contrast delayed myocardial enhancement. Normal T1 myocardial nulling kinetics suggest against a diagnosis of cardiac amyloidosis. ECV = 32%, nonspecific elevation. RIGHT VENTRICLE: Normal right ventricular chamber size. Increased right ventricular  wall thickness. Normal right ventricular systolic function. RVEF = 55% There are no regional wall motion abnormalities. No post contrast delayed myocardial enhancement. ATRIA: Left atrium: Mildly dilated Right atrium: Normal size Aorta: Sinus of Valsalva likely upper normal range for age and BSA, 37 x 39 x 39 mm sinus to sinus technique. PERICARDIUM: Normal pericardium.  No pericardial effusion. OTHER: Bilateral  pleural effusions with fluid in the fissures. Dilated IVC MEASUREMENTS: Qp/Qs: 0.87 VALVES: Aortic valve regurgitation: trivial, regurgitant fraction 3% Pulmonary valve regurgitation: Trivial, regurgitant fraction 2% Mitral valve regurgitation: Trivial, regurgitant fraction 6% Tricuspid valve regurgitation: Mild, regurgitant fraction 14% Left ventricle: LV male LV EF: 59% (normal 49-79%) Absolute volumes: LV EDV: (Normal 95-215 mL) LV ESV: 44mL (Normal 25-85 mL) LV SV: 63mL (Normal 61-145 mL) CO: 4.4L/min (Normal 3.4-7.8 L/min) Indexed volumes: CI: 1.6L/min/sq-m (Normal 1.8-4.2 L/min/sq-m) LV EDV: 29mL/sq-m (Normal 50-108 mL/sq-m) LV ESV: 47mL/sq-m (Normal 11-47 mL/sq-m) LV SV: 31mL/sq-m (Normal 33-72 mL/sq-m) Right ventricle: RV male RV EF:  55% (normal 51-80%) Absolute volumes: RV EDV: (Normal 109-217 mL) RV ESV: 49mL (Normal 23-91 mL) RV SV: 59mL (Normal 71-141 mL) CO: 4.2L/min (Normal 2.8-8.8 L/min) Indexed volumes: CI: 1.52L/min/sq-m (Normal 1.7-4.2 L/min/sq-m) RV EDV: 61mL/sq-m (Normal 58-109 mL/sq-m) RV ESV: 43mL/sq-m (Normal 12-46 mL/sq-m) RV SV: 68mL/sq-m (Normal 38-71 mL/sq-m) IMPRESSION: 1. Normal left chamber size and function, LVEF 59% with moderate concentric LVH. No delayed myocardial enhancement. No myocardial edema. Normal T1 myocardial nulling. Findings do not suggest an infiltrative process. 2.  Normal right ventricular chamber size and function, RVEF 55%. 3.  No hemodynamically significant valvular heart disease. Electronically Signed   By: Grady Lawman M.D.   On: 12/22/2023 11:25   DG Chest Portable 1 View Result Date: 12/21/2023 CLINICAL DATA:  Hypoxia short of breath EXAM: PORTABLE CHEST 1 VIEW COMPARISON:  11/05/2023, 10/07/2018 FINDINGS: Mild cardiomegaly with aortic atherosclerosis. Chronic pleural changes and areas of scarring. No acute confluent airspace disease or pneumothorax. IMPRESSION: No active disease. Mild cardiomegaly. Chronic pleural changes and areas of  scarring. Electronically Signed   By: Esmeralda Hedge M.D.   On: 12/21/2023 22:11   CT ANGIO LOWER EXT BILAT W &/OR WO CONTRAST Result Date: 12/21/2023 CLINICAL DATA:  Bilateral toe discoloration EXAM: CT ANGIOGRAPHY OF ABDOMINAL AORTA WITH ILIOFEMORAL RUNOFF TECHNIQUE: Multidetector CT imaging of the abdomen, pelvis and lower extremities was performed using the standard protocol during bolus administration of intravenous contrast. Multiplanar CT image reconstructions and MIPs were obtained to evaluate the vascular anatomy. RADIATION DOSE REDUCTION: This exam was performed according to the departmental dose-optimization program which includes automated exposure control, adjustment of the mA and/or kV according to patient size and/or use of iterative reconstruction technique. CONTRAST:  OMNIPAQUE  IOHEXOL  350 MG/ML SOLN COMPARISON:  Chest CT 10/07/2018 FINDINGS: VASCULAR Aorta: Normal caliber aorta without aneurysm, dissection, vasculitis or significant stenosis. Moderate aortic atherosclerosis. Celiac: Mild stenosis of the proximal celiac artery but widely patent distal branches. No dissection, aneurysm or occlusion SMA: At least moderate focal stenosis of the proximal SMA about a cm distal to the origin. Distal vascular patency. No aneurysm or occlusion Renals: Single right and single left renal arteries. Calcified origins limits assessment for stenosis. No high-grade stenosis is seen. Suspect mild origin stenosis. IMA: Diminutive but patent. RIGHT Lower Extremity Inflow: Common, internal and external iliac arteries are patent without evidence of aneurysm, dissection, vasculitis or significant stenosis. Distal external iliac artery assessment limited by artifact from right hip hardware Outflow: Slightly limited assessment of right common  femoral artery due to artifact from right hip hardware. Superficial and deep femoral arteries appear patent. Mild to moderate diffuse disease without high-grade focal  stenosis or occlusion. Moderate disease of the popliteal artery without occlusion, aneurysm or high-grade stenosis. Runoff: Assessment of the posterior tibial artery is limited secondary to diffuse calcific disease. Definite patency of the anterior tibial and fibular arteries to the ankle, anterior tibial artery enhances at the dorsum of the foot. LEFT Lower Extremity Inflow: Common, internal and external iliac arteries are patent without evidence of aneurysm, dissection, vasculitis or significant stenosis. Moderate atherosclerosis. Partially obscured left common femoral artery from artifact arising from left hip hardware. Outflow: Left common femoral and deep femoral arteries appear grossly patent. Moderate focal stenosis of the proximal superficial femoral artery, series 6, image 287. This is about 6 cm distal to the origin of the SFA. Popliteal artery shows no significant stenosis, aneurysm or occlusion. Runoff: Assessment of the posterior tibial artery is limited due to diffuse calcific disease. Definite patency of the anterior tibial and fibular arteries to the ankle, anterior tibial artery enhances at the dorsum of the foot. Probable patency of posterior tibial artery to the level of the ankle. Veins: Suboptimally evaluated Review of the MIP images confirms the above findings. 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 2 suspected areas of pulmonary scarring in the lower lobes and left upper lobe. Cardiomegaly. Hepatobiliary: Cholecystectomy. No focal hepatic abnormality. Extrahepatic biliary dilatation, dilated common bile duct measuring up to 15 mm. Pancreas: Atrophic.  No inflammation Spleen: Possible small splenic calcification as may be seen with granuloma. Adrenals/Urinary Tract: Adrenal glands are normal. Kidneys are atrophic with cortical scarring. No hydronephrosis. Small kidney stones. Right  renal cyst for which no imaging follow-up is recommended. The bladder is obscured by artifact Stomach/Bowel: Gastric bypass changes. No acute bowel wall thickening or evidence for obstruction. Lymphatic: No suspicious lymph nodes Reproductive: Obscured by artifact Other: Negative for pelvic effusion or free air Musculoskeletal: Bilateral hip replacements with artifact. No definite acute osseous abnormality is seen. Multilevel degenerative changes. Mild atrophy of thigh and calf muscles. No focal fluid collections. IMPRESSION: VASCULAR 1. Moderate aortic atherosclerosis without dissection, aneurysm or acute occlusive disease. 2. Diffusely diseased lower extremity vessels. At least moderate focal stenosis of the left superficial femoral artery as above. 3. Limited assessment of the posterior tibial arteries secondary to diffuse calcific disease. Definite 2 vessel patency to the ankle via the anterior tibial and fibular arteries. Anterior tibial arteries enhance at the dorsum of the foot. 4. 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 Areas of scarring in the left base and left upper lobe. Cardiomegaly 2. No definite CT evidence for acute intra-abdominal or pelvic abnormality 3. Cholecystectomy. Extrahepatic biliary dilatation which may be correlated with LFTs 4. Nonobstructing kidney stones Electronically Signed   By: Esmeralda Hedge M.D.   On: 12/21/2023 22:10     Subjective: Patient seen examined bedside, lying in bed.  Eating breakfast.  No complaints this morning.  Received insurance authorization yesterday evening, discharging to SNF today.  Denies headache, no dizziness, no chest pain, palpitations, no shortness of breath, no abdominal pain, no fever/chills/night sweats, no nausea/vomiting/diarrhea, no focal weakness, no fatigue, no paresthesias.  No acute events  overnight per nursing staff.  Discharge Exam: Vitals:   12/26/23 1610 12/26/23 9604  BP: 122/61 (!) 131/56  Pulse: 78 69  Resp: 19 17  Temp: (!) 97.4 F (36.3 C) 98.2 F (36.8 C)  SpO2: 95% 90%   Vitals:   12/25/23 1449 12/25/23 1952 12/26/23 0509 12/26/23 0823  BP: (!) 122/52 (!) 129/58 122/61 (!) 131/56  Pulse: 68 71 78 69  Resp: 18 17 19 17   Temp: 97.9 F (36.6 C) 97.9 F (36.6 C) (!) 97.4 F (36.3 C) 98.2 F (36.8 C)  TempSrc: Oral Oral Oral Oral  SpO2: 98% 97% 95% 90%  Weight:      Height:        Physical Exam: GEN: NAD, alert and oriented x 3, morbidly obese HEENT: NCAT, PERRL, EOMI, sclera clear, MMM PULM: CTAB w/o wheezes/crackles, normal respiratory effort, on 2 L nasal cannula which is his baseline CV: RRR w/o M/G/R GI: abd soft, NTND, NABS, no R/G/M MSK: Trace bilateral lower extremity peripheral edema, moves all extremities independently, no tremors noted on physical exam today NEURO: CN II-XII intact, no focal deficits, sensation to light touch intact PSYCH: normal mood/affect Integumentary: No concerning rashes/lesions/wounds noted on exposed skin surfaces    The results of significant diagnostics from this hospitalization (including imaging, microbiology, ancillary and laboratory) are listed below for reference.     Microbiology: Recent Results (from the past 240 hours)  MRSA Next Gen by PCR, Nasal     Status: Abnormal   Collection Time: 12/22/23  3:24 PM   Specimen: Nasal Mucosa; Nasal Swab  Result Value Ref Range Status   MRSA by PCR Next Gen DETECTED (A) NOT DETECTED Final    Comment: RESULT CALLED TO, READ BACK BY AND VERIFIED WITH: RN Lafonda Piety [phone] @[phone]  FH (NOTE) The GeneXpert MRSA Assay (FDA approved for NASAL specimens only), is one component of a comprehensive MRSA colonization surveillance program. It is not intended to diagnose MRSA infection nor to guide or monitor treatment for MRSA infections. Test performance is not FDA  approved in patients less than 74 years old. Performed at [facility], [address] [place], [place] [address]      Labs: BNP (last 3 results) Recent Labs    10/25/23 1541 12/21/23 1600  BNP 148.0* 252.2*   Basic Metabolic Panel: Recent Labs  Lab 12/21/23 1600 12/22/23 0445 12/23/23 0224 12/24/23 0631 12/25/23 0709  NA 137 135 137 136 140  K 4.1 3.6 4.8 3.9 4.8  CL 96* 97* 102 99 106  CO2 26 26 23 26 25   GLUCOSE 128* 103* 124* 99 105*  BUN 22 20 24* 21 18  CREATININE 1.47* 1.42* 1.61* 1.43* 1.41*  CALCIUM  9.7 9.0 8.7* 8.6* 8.6*  MG 2.1 2.0 2.3  --   --   PHOS  --  3.0  --   --   --    Liver Function Tests: Recent Labs  Lab 12/21/23 1600  AST 31  ALT 18  ALKPHOS 86  BILITOT 1.0  PROT 7.6  ALBUMIN  3.5   No results for input(s): "LIPASE", "AMYLASE" in the last 168 hours. No results for input(s): "AMMONIA" in the last 168 hours. CBC: Recent Labs  Lab 12/21/23 1600 12/22/23 0445  WBC 7.7 7.0  NEUTROABS 6.1  --   HGB 14.2 11.8*  HCT 43.7 36.7*  MCV 98.9 98.4  PLT 185 151   Cardiac Enzymes: No results for input(s): "CKTOTAL", "CKMB", "CKMBINDEX", "TROPONINI" in the last 168 hours. BNP: Invalid input(s): "POCBNP" CBG: No results for input(s): "GLUCAP" in the last 168  hours. D-Dimer No results for input(s): "DDIMER" in the last 72 hours. Hgb A1c No results for input(s): "HGBA1C" in the last 72 hours. Lipid Profile No results for input(s): "CHOL", "HDL", "LDLCALC", "TRIG", "CHOLHDL", "LDLDIRECT" in the last 72 hours. Thyroid  function studies No results for input(s): "TSH", "T4TOTAL", "T3FREE", "THYROIDAB" in the last 72 hours.  Invalid input(s): "FREET3" Anemia work up No results for input(s): "VITAMINB12", "FOLATE", "FERRITIN", "TIBC", "IRON", "RETICCTPCT" in the last 72 hours. Urinalysis    Component Value Date/Time   COLORURINE YELLOW 11/06/2023 0810   APPEARANCEUR CLEAR 11/06/2023 0810   APPEARANCEUR Clear 01/18/2018 1539   LABSPEC  1.030 11/06/2023 0810   PHURINE 7.0 11/06/2023 0810   GLUCOSEU NEGATIVE 11/06/2023 0810   HGBUR NEGATIVE 11/06/2023 0810   BILIRUBINUR NEGATIVE 11/06/2023 0810   BILIRUBINUR Negative 01/18/2018 1539   KETONESUR NEGATIVE 11/06/2023 0810   PROTEINUR NEGATIVE 11/06/2023 0810   UROBILINOGEN 0.2 09/06/2012 0213   NITRITE NEGATIVE 11/06/2023 0810   LEUKOCYTESUR NEGATIVE 11/06/2023 0810   Sepsis Labs Recent Labs  Lab 12/21/23 1600 12/22/23 0445  WBC 7.7 7.0   Microbiology Recent Results (from the past 240 hours)  MRSA Next Gen by PCR, Nasal     Status: Abnormal   Collection Time: 12/22/23  3:24 PM   Specimen: Nasal Mucosa; Nasal Swab  Result Value Ref Range Status   MRSA by PCR Next Gen DETECTED (A) NOT DETECTED Final    Comment: RESULT CALLED TO, READ BACK BY AND VERIFIED WITH: RN Lafonda Piety [phone] @[phone]  FH (NOTE) The GeneXpert MRSA Assay (FDA approved for NASAL specimens only), is one component of a comprehensive MRSA colonization surveillance program. It is not intended to diagnose MRSA infection nor to guide or monitor treatment for MRSA infections. Test performance is not FDA approved in patients less than 47 years old. Performed at [facility], [address] [place], [place] [address]      Time coordinating discharge: Over 30 minutes  SIGNED:   Rema Care [place], DO  [facility] 12/26/2023, 10:41 AM

## 2023-12-26 NOTE — Progress Notes (Signed)
 Tried to call report to [facility] before pt left with PT. Receptionist transferred me to nurse but no one picked the phone up. PTAR aware.

## 2023-12-26 NOTE — Plan of Care (Signed)

## 2023-12-26 NOTE — TOC Transition Note (Signed)
 Transition of Care [facility]) - Discharge Note   Patient Details  Name: [name] MRN: [id number] Date of Birth: 1946-04-23  [facility] CM/SW Contact:  Jeffory Mings, [place] Phone Number: 12/26/2023, 10:23 AM   Clinical Narrative: Pt for dc to [facility] today. Spoke to [place] in admissions who confirmed they are prepared to admit pt to room 108. Pt aware of dc and reports agreeable. RN provided with number for report and PTAR arranged for transport. Pt's home CPAP in room and will need to go with pt to SNF, RN aware. SW signing off at dc.    Paullette Boston, MSW, LCSW [phone] (coverage)        Final next level of care: Skilled Nursing Facility Barriers to Discharge: Barriers Resolved   Patient Goals and CMS Choice Patient states their goals for this hospitalization and ongoing recovery are:: SNF CMS Medicare.gov Compare Post Acute Care list provided to:: Patient Choice offered to / list presented to : Patient [place] ownership interest in [facility].provided to:: Patient    Discharge Placement              Patient chooses bed at: Other - please specify in the comment section below: [facility]) Patient to be transferred to facility by: PTAR Name of family member notified: Pt to update famliy Patient and family notified of of transfer: 12/26/23  Discharge Plan and Services Additional resources added to the After Visit Summary for   In-house Referral: Clinical Social Work   Post Acute Care Choice: Skilled Nursing Facility                               Social Drivers of Health (SDOH) Interventions SDOH Screenings   Food Insecurity: No Food Insecurity (12/22/2023)  Housing: Low Risk  (12/22/2023)  Transportation Needs: No Transportation Needs (12/22/2023)  Utilities: Not At Risk (12/22/2023)  Financial Resource Strain: Low Risk  (09/15/2023)   Received from [facility]  Physical Activity: Sufficiently Active (01/11/2023)    Received from [facility]  Social Connections: Moderately Isolated (12/22/2023)  Stress: No Stress Concern Present (10/08/2023)   Received from [facility]  Tobacco Use: Medium Risk (12/21/2023)     Readmission Risk Interventions    11/09/2023   10:58 AM  Readmission Risk Prevention Plan  Transportation Screening Complete  Home Care Screening Complete  Medication Review (RN CM) Complete

## 2023-12-30 ENCOUNTER — Other Ambulatory Visit: Payer: Self-pay

## 2023-12-30 ENCOUNTER — Telehealth: Payer: Self-pay

## 2023-12-30 ENCOUNTER — Other Ambulatory Visit: Payer: Self-pay | Admitting: Radiology

## 2023-12-30 DIAGNOSIS — Z79899 Other long term (current) drug therapy: Secondary | ICD-10-CM

## 2023-12-30 DIAGNOSIS — I5032 Chronic diastolic (congestive) heart failure: Secondary | ICD-10-CM

## 2023-12-30 DIAGNOSIS — I482 Chronic atrial fibrillation, unspecified: Secondary | ICD-10-CM

## 2023-12-30 DIAGNOSIS — R768 Other specified abnormal immunological findings in serum: Secondary | ICD-10-CM

## 2023-12-30 NOTE — Telephone Encounter (Signed)
-----   Message from Ola Berger sent at 12/30/2023  4:22 PM EDT ----- Sounds like I will be seeing patient at some point   Would add BMET and Digoxin  level when seen ----- Message ----- From: Raliegh Burgess, [facility] Sent: 12/26/2023  11:40 AM EDT To: Elmyra Haggard, MD  Dr. Avanell Bob,   Mr Milum was admitted for near syncopal episode and plans are for discharge today. He is on digoxin  (PTA medication) and a digoxin  level was 2.0 this admission. It was held for a few days. I am not sure this is an accurate level. He she be contacting your office soon for an appointment. Consider checking a digoxin  level.  Thank you, Debria Fang

## 2023-12-30 NOTE — Telephone Encounter (Signed)
 Lab orders placed and will note on his upcoming OV notes.

## 2024-01-01 ENCOUNTER — Ambulatory Visit (HOSPITAL_COMMUNITY)

## 2024-01-11 ENCOUNTER — Inpatient Hospital Stay: Attending: Hematology | Admitting: Hematology

## 2024-01-11 DIAGNOSIS — E539 Vitamin B deficiency, unspecified: Secondary | ICD-10-CM

## 2024-01-11 DIAGNOSIS — R768 Other specified abnormal immunological findings in serum: Secondary | ICD-10-CM | POA: Diagnosis not present

## 2024-01-11 DIAGNOSIS — D538 Other specified nutritional anemias: Secondary | ICD-10-CM

## 2024-01-11 NOTE — Progress Notes (Signed)
 Virtual Visit via Telephone Note  I connected with [name] on 01/11/24 at  3:45 PM EDT by telephone and verified that I am speaking with the correct person using two identifiers.  Location: Patient: In the hospital at [facility] Provider: In the office   I discussed the limitations, risks, security and privacy concerns of performing an evaluation and management service by telephone and the availability of in person appointments. I also discussed with the patient that there may be a patient responsible charge related to this service. The patient expressed understanding and agreed to proceed.   History of Present Illness: He was last seen by me on 12/08/2023 at the request of Dr. Avanell Bob for elevated free light chains to workup for amyloidosis.  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   Observations/Objective: He reports that he is admitted to [facility] and will be discharged home later today.  As a result, he could not do the bone marrow biopsy previously ordered by us .  Assessment and Plan:  1.  Elevated free light chains: - We reviewed the 24-hour urine studies: Total protein was 126 mg.  Negative for UPEP and immunofixation. - LDH was normal.  Repeat kappa light chains were high at 67, lambda light chains 48.9, ratio of 1.37. - He could not do bone marrow biopsy as he was hospitalized. - We will schedule him for bone marrow biopsy, request amyloid stain and cytogenetics.  Will see him back after the biopsy.  2.  B12 deficiency/macrocytic anemia: -Ferritin was elevated at 715, saturation 29.  B12 and folic acid  were normal.  However methylmalonic acid was elevated at 430. - Recommend that he start taking B12 1 mg tablet daily. 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  It could also be a CHIP.  Follow Up Instructions:  RTC after bone marrow biopsy I  discussed the assessment and treatment plan with the patient. The patient was provided an opportunity to ask questions and all were answered. The patient agreed with the plan and demonstrated an understanding of the instructions.   The patient was advised to call back or seek an in-person evaluation if the symptoms worsen or if the condition fails to improve as anticipated.  I provided 20 minutes of non-face-to-face time during this encounter.   Paulett Boros, MD

## 2024-01-12 ENCOUNTER — Telehealth: Payer: Self-pay | Admitting: *Deleted

## 2024-01-12 NOTE — Telephone Encounter (Signed)
 Patient called and has elected Hospice care. BMBX cancelled as well as future appointments.  Dr. Katragadda made aware.

## 2024-01-15 ENCOUNTER — Other Ambulatory Visit (HOSPITAL_COMMUNITY): Payer: Self-pay

## 2024-01-15 ENCOUNTER — Telehealth: Payer: Self-pay | Admitting: Internal Medicine

## 2024-01-15 NOTE — Telephone Encounter (Signed)
 Per pt he is now in hospice and will not be coming for anymore appt. He doesn't want us  to call him for anymore appts

## 2024-01-18 ENCOUNTER — Ambulatory Visit: Admitting: Nurse Practitioner

## 2024-02-06 ENCOUNTER — Inpatient Hospital Stay (HOSPITAL_COMMUNITY)
Admission: EM | Admit: 2024-02-06 | Discharge: 2024-02-11 | DRG: 291 | Disposition: A | Discharge status: Hospice | Attending: Internal Medicine | Admitting: Internal Medicine

## 2024-02-06 ENCOUNTER — Emergency Department (HOSPITAL_COMMUNITY)

## 2024-02-06 DIAGNOSIS — I4821 Permanent atrial fibrillation: Secondary | ICD-10-CM | POA: Diagnosis present

## 2024-02-06 DIAGNOSIS — K7682 Hepatic encephalopathy: Secondary | ICD-10-CM | POA: Insufficient documentation

## 2024-02-06 DIAGNOSIS — J9621 Acute and chronic respiratory failure with hypoxia: Secondary | ICD-10-CM | POA: Diagnosis present

## 2024-02-06 DIAGNOSIS — Z515 Encounter for palliative care: Secondary | ICD-10-CM | POA: Diagnosis not present

## 2024-02-06 DIAGNOSIS — G9341 Metabolic encephalopathy: Secondary | ICD-10-CM | POA: Diagnosis present

## 2024-02-06 DIAGNOSIS — F1120 Opioid dependence, uncomplicated: Secondary | ICD-10-CM | POA: Diagnosis present

## 2024-02-06 DIAGNOSIS — Z7901 Long term (current) use of anticoagulants: Secondary | ICD-10-CM | POA: Diagnosis not present

## 2024-02-06 DIAGNOSIS — Z79899 Other long term (current) drug therapy: Secondary | ICD-10-CM

## 2024-02-06 DIAGNOSIS — N1831 Chronic kidney disease, stage 3a: Secondary | ICD-10-CM | POA: Diagnosis not present

## 2024-02-06 DIAGNOSIS — K7460 Unspecified cirrhosis of liver: Secondary | ICD-10-CM | POA: Diagnosis not present

## 2024-02-06 DIAGNOSIS — R001 Bradycardia, unspecified: Secondary | ICD-10-CM | POA: Diagnosis present

## 2024-02-06 DIAGNOSIS — I10 Essential (primary) hypertension: Secondary | ICD-10-CM

## 2024-02-06 DIAGNOSIS — I9589 Other hypotension: Secondary | ICD-10-CM | POA: Diagnosis present

## 2024-02-06 DIAGNOSIS — Z6841 Body Mass Index (BMI) 40.0 and over, adult: Secondary | ICD-10-CM | POA: Diagnosis not present

## 2024-02-06 DIAGNOSIS — Z87891 Personal history of nicotine dependence: Secondary | ICD-10-CM

## 2024-02-06 DIAGNOSIS — T460X5A Adverse effect of cardiac-stimulant glycosides and drugs of similar action, initial encounter: Secondary | ICD-10-CM | POA: Diagnosis present

## 2024-02-06 DIAGNOSIS — Z7189 Other specified counseling: Secondary | ICD-10-CM | POA: Diagnosis not present

## 2024-02-06 DIAGNOSIS — K219 Gastro-esophageal reflux disease without esophagitis: Secondary | ICD-10-CM | POA: Diagnosis not present

## 2024-02-06 DIAGNOSIS — Z96643 Presence of artificial hip joint, bilateral: Secondary | ICD-10-CM | POA: Diagnosis present

## 2024-02-06 DIAGNOSIS — I480 Paroxysmal atrial fibrillation: Secondary | ICD-10-CM | POA: Diagnosis present

## 2024-02-06 DIAGNOSIS — E66813 Obesity, class 3: Secondary | ICD-10-CM | POA: Diagnosis not present

## 2024-02-06 DIAGNOSIS — D638 Anemia in other chronic diseases classified elsewhere: Secondary | ICD-10-CM | POA: Diagnosis present

## 2024-02-06 DIAGNOSIS — K7290 Hepatic failure, unspecified without coma: Secondary | ICD-10-CM | POA: Insufficient documentation

## 2024-02-06 DIAGNOSIS — F32A Depression, unspecified: Secondary | ICD-10-CM | POA: Diagnosis not present

## 2024-02-06 DIAGNOSIS — E871 Hypo-osmolality and hyponatremia: Secondary | ICD-10-CM | POA: Diagnosis present

## 2024-02-06 DIAGNOSIS — M159 Polyosteoarthritis, unspecified: Secondary | ICD-10-CM | POA: Diagnosis present

## 2024-02-06 DIAGNOSIS — Z789 Other specified health status: Secondary | ICD-10-CM | POA: Diagnosis not present

## 2024-02-06 DIAGNOSIS — Z7952 Long term (current) use of systemic steroids: Secondary | ICD-10-CM | POA: Diagnosis not present

## 2024-02-06 DIAGNOSIS — I5033 Acute on chronic diastolic (congestive) heart failure: Principal | ICD-10-CM | POA: Diagnosis present

## 2024-02-06 DIAGNOSIS — Z66 Do not resuscitate: Secondary | ICD-10-CM | POA: Diagnosis present

## 2024-02-06 DIAGNOSIS — Z9884 Bariatric surgery status: Secondary | ICD-10-CM

## 2024-02-06 DIAGNOSIS — N1832 Chronic kidney disease, stage 3b: Secondary | ICD-10-CM | POA: Diagnosis present

## 2024-02-06 DIAGNOSIS — G8929 Other chronic pain: Secondary | ICD-10-CM | POA: Diagnosis present

## 2024-02-06 DIAGNOSIS — Z9109 Other allergy status, other than to drugs and biological substances: Secondary | ICD-10-CM

## 2024-02-06 DIAGNOSIS — Z91030 Bee allergy status: Secondary | ICD-10-CM

## 2024-02-06 DIAGNOSIS — I130 Hypertensive heart and chronic kidney disease with heart failure and stage 1 through stage 4 chronic kidney disease, or unspecified chronic kidney disease: Secondary | ICD-10-CM | POA: Diagnosis present

## 2024-02-06 DIAGNOSIS — J810 Acute pulmonary edema: Principal | ICD-10-CM

## 2024-02-06 DIAGNOSIS — Z811 Family history of alcohol abuse and dependence: Secondary | ICD-10-CM

## 2024-02-06 DIAGNOSIS — J9622 Acute and chronic respiratory failure with hypercapnia: Secondary | ICD-10-CM | POA: Diagnosis present

## 2024-02-06 DIAGNOSIS — N179 Acute kidney failure, unspecified: Secondary | ICD-10-CM

## 2024-02-06 DIAGNOSIS — N189 Chronic kidney disease, unspecified: Secondary | ICD-10-CM

## 2024-02-06 DIAGNOSIS — M47812 Spondylosis without myelopathy or radiculopathy, cervical region: Secondary | ICD-10-CM | POA: Diagnosis present

## 2024-02-06 LAB — BRAIN NATRIURETIC PEPTIDE: B Natriuretic Peptide: 782 pg/mL — ABNORMAL HIGH (ref 0.0–100.0)

## 2024-02-06 LAB — CBC WITH DIFFERENTIAL/PLATELET
Abs Immature Granulocytes: 0.02 10*3/uL (ref 0.00–0.07)
Basophils Absolute: 0 10*3/uL (ref 0.0–0.1)
Basophils Relative: 0 %
Eosinophils Absolute: 0.2 10*3/uL (ref 0.0–0.5)
Eosinophils Relative: 2 %
HCT: 35 % — ABNORMAL LOW (ref 39.0–52.0)
Hemoglobin: 10.8 g/dL — ABNORMAL LOW (ref 13.0–17.0)
Immature Granulocytes: 0 %
Lymphocytes Relative: 8 %
Lymphs Abs: 0.6 10*3/uL — ABNORMAL LOW (ref 0.7–4.0)
MCH: 32.2 pg (ref 26.0–34.0)
MCHC: 30.9 g/dL (ref 30.0–36.0)
MCV: 104.5 fL — ABNORMAL HIGH (ref 80.0–100.0)
Monocytes Absolute: 1.2 10*3/uL — ABNORMAL HIGH (ref 0.1–1.0)
Monocytes Relative: 14 %
Neutro Abs: 6.2 10*3/uL (ref 1.7–7.7)
Neutrophils Relative %: 76 %
Platelets: 134 10*3/uL — ABNORMAL LOW (ref 150–400)
RBC: 3.35 MIL/uL — ABNORMAL LOW (ref 4.22–5.81)
RDW: 15.9 % — ABNORMAL HIGH (ref 11.5–15.5)
WBC: 8.2 10*3/uL (ref 4.0–10.5)
nRBC: 0 % (ref 0.0–0.2)

## 2024-02-06 LAB — COMPREHENSIVE METABOLIC PANEL WITH GFR
ALT: 403 U/L — ABNORMAL HIGH (ref 0–44)
AST: 439 U/L — ABNORMAL HIGH (ref 15–41)
Albumin: 2.9 g/dL — ABNORMAL LOW (ref 3.5–5.0)
Alkaline Phosphatase: 103 U/L (ref 38–126)
Anion gap: 9 (ref 5–15)
BUN: 54 mg/dL — ABNORMAL HIGH (ref 8–23)
CO2: 32 mmol/L (ref 22–32)
Calcium: 8 mg/dL — ABNORMAL LOW (ref 8.9–10.3)
Chloride: 92 mmol/L — ABNORMAL LOW (ref 98–111)
Creatinine, Ser: 2.54 mg/dL — ABNORMAL HIGH (ref 0.61–1.24)
GFR, Estimated: 25 mL/min — ABNORMAL LOW (ref >60)
Glucose, Bld: 143 mg/dL — ABNORMAL HIGH (ref 70–99)
Potassium: 4.4 mmol/L (ref 3.5–5.1)
Sodium: 133 mmol/L — ABNORMAL LOW (ref 135–145)
Total Bilirubin: 0.6 mg/dL (ref 0.0–1.2)
Total Protein: 6.5 g/dL (ref 6.5–8.1)

## 2024-02-06 LAB — BLOOD GAS, ARTERIAL
Acid-Base Excess: 9.6 mmol/L — ABNORMAL HIGH (ref 0.0–2.0)
Bicarbonate: 38.5 mmol/L — ABNORMAL HIGH (ref 20.0–28.0)
Drawn by: 10555
O2 Saturation: 98.6 %
Patient temperature: 37
pCO2 arterial: 73 mmHg (ref 32–48)
pH, Arterial: 7.33 — ABNORMAL LOW (ref 7.35–7.45)
pO2, Arterial: 101 mmHg (ref 83–108)

## 2024-02-06 LAB — DIGOXIN LEVEL: Digoxin Level: 2.4 ng/mL — ABNORMAL HIGH (ref 0.8–2.0)

## 2024-02-06 LAB — TROPONIN I (HIGH SENSITIVITY): Troponin I (High Sensitivity): 218 ng/L (ref <18)

## 2024-02-06 MED ORDER — FUROSEMIDE 10 MG/ML IJ SOLN
40.0000 mg | INTRAMUSCULAR | Status: AC
  Administered 2024-02-06: 40 mg via INTRAVENOUS
  Filled 2024-02-06: qty 4

## 2024-02-06 MED ORDER — SODIUM CHLORIDE 0.9 % IV SOLN: INTRAVENOUS | Status: DC

## 2024-02-06 NOTE — Assessment & Plan Note (Addendum)
 Echocardiogram with preserved LV systolic function with EF 55%. Mild LVH, RV systolic function preserved, normal size LA and RA, with no significant valvular disease.   Patient not on diuretic therapy at home Plan to start patient on IV furosemide  60 mg IV bid Continue midodrine  and fludrocortisone  for blood pressure support.   Acute hypoxemic and hypercapnic respiratory failure due to acute cardiogenic pulmonary edema.  Complicated with acute metabolic encephalopathy.   Plan to continue diuresis Non invasive mechanical ventilation with Bipap 14/6 with Fi02 30% tidal volumes generated around 600 ml. Aspiration precautions.

## 2024-02-06 NOTE — ED Provider Notes (Signed)
 [name]   CSN: [id number] Arrival date & time: 02/06/24  1851     History {Add pertinent medical, surgical, social history, OB history to HPI:1} No chief complaint on file.   [name] is a 78 y.o. male.  HPI   This patient is a 78 year old male, he has a history of congestive heart failure on digoxin , he has a history of congestive heart failure, he is on midodrine  and Xarelto .  The patient has a medical history of being admitted to the hospital about a month ago with congestive heart failure at an outside hospital, he has paroxysmal atrial fibrillation and is followed by cardiology, the last echocardiogram was from March and showed that the patient had an ejection fraction of 55% but the images were considered to have "poor quality".  The patient presents with increasing shortness of breath over the last several days, he ran out of his diuretic about a week ago, he reports that he is on a diuretic however when I look at his medical history I do not see any diuretics listed in his medicines.  Evidently the patient is on 3-1/2 L of oxygen chronically, he has anemia of chronic disease and a history of some cirrhosis.  It was noted that he has chronic hypoxic respiratory failure due to diastolic heart failure exacerbations, he was transition to oral Lasix  at his hospitalization a month ago and was to be discharged on 80 mg of Lasix  daily.  Again the patient reports that he ran out about a week ago.  He endorses chronic lower extremity edema, he has not had any fevers or chills, no headache, no nausea or vomiting, he feels dry in the mouth and has made very little urine, paramedics noted that there was a small amount of very dark-colored urine when they arrived.  The first responders placed the patient on a nonrebreather, he was 95% but tachypneic, they transitioned him to CPAP with significant improvement in his work of  breathing  Home Medications Prior to Admission medications   Medication Sig Start Date End Date Taking? Authorizing Provider  acetaminophen  (TYLENOL ) 500 MG tablet Take 1,000 mg by mouth as needed for mild pain (pain score 1-3) or headache.    [provider]  alum & mag hydroxide-simeth (MAALOX/MYLANTA) 200-200-20 MG/5ML suspension Take 15 mLs by mouth every 6 (six) hours as needed for indigestion or heartburn.    [provider]  Carboxymethylcellulose Sodium (THERATEARS) 0.25 % SOLN Place 1-2 drops into both eyes as needed (Dry, irritated eyes.).    [provider]  digoxin  (LANOXIN ) 0.25 MG tablet Take 1 tablet (0.25 mg total) by mouth daily. 11/01/23   Justina Oman, MD  Ensure Max Protein (ENSURE MAX PROTEIN) LIQD Take 330 mLs (11 oz total) by mouth 2 (two) times daily. Patient taking differently: Take 11-22 oz by mouth 2 (two) times daily as needed. 12/07/18   Singh, Prashant K, MD  fludrocortisone  (FLORINEF ) 0.1 MG tablet Take 1 tablet (0.1 mg total) by mouth daily. 12/26/23   [place], Rema Care, DO  gabapentin  (NEURONTIN ) 300 MG capsule Take 1 capsule (300 mg total) by mouth 2 (two) times daily. Patient taking differently: Take 600-900 mg by mouth 2 (two) times daily. Take two capsules in the morning and three capsules at bedtime. 11/17/23   Gerald Kitty., NP  HYDROcodone -acetaminophen  (NORCO) 10-325 MG tablet Take 1 tablet by mouth every 6 (six) hours as needed for moderate  pain (pain score 4-6). 12/26/23   [place], Rema Care, DO  levalbuterol  (XOPENEX  HFA) 45 MCG/ACT inhaler Inhale 2 puffs into the lungs every 6 (six) hours as needed for wheezing or shortness of breath. 10/31/23 10/30/24  Justina Oman, MD  Melatonin 3 MG TABS Take 2 tablets (6 mg total) by mouth at bedtime. 09/11/18   Krishnan, Gokul, MD  midodrine  (PROAMATINE ) 10 MG tablet Take 1 tablet (10 mg total) by mouth 3 (three) times daily with meals. Patient taking differently: Take 20 mg by mouth 2 (two) times  daily. 11/17/23   Gerald Kitty., NP  Multiple Vitamins-Minerals (CENTRUM ADULTS PO) Take 1 tablet by mouth daily.    [provider]  OXYGEN Inhale 2 L into the lungs continuous.    [provider]  pantoprazole  (PROTONIX ) 40 MG tablet Take 1 tablet (40 mg total) by mouth daily. 09/02/18   Hongalgi, Anand D, MD  promethazine  (PHENERGAN ) 25 MG tablet Take 25 mg by mouth every 6 (six) hours as needed for nausea or vomiting. 09/22/23   [provider]  XARELTO  20 MG TABS tablet Take 20 mg by mouth every evening. 09/18/20   [provider]      Allergies    Bee venom and Nickel    Review of Systems   Review of Systems  All other systems reviewed and are negative.   Physical Exam Updated Vital Signs There were no vitals taken for this visit. Physical Exam Vitals and nursing Cooper reviewed.  Constitutional:      General: He is in acute distress.     Appearance: He is well-developed. He is ill-appearing.  HENT:     Head: Normocephalic and atraumatic.     Mouth/Throat:     Mouth: Mucous membranes are dry.     Pharynx: No oropharyngeal exudate.  Eyes:     General: No scleral icterus.       Right eye: No discharge.        Left eye: No discharge.     Conjunctiva/sclera: Conjunctivae normal.     Pupils: Pupils are equal, round, and reactive to light.  Neck:     Thyroid : No thyromegaly.     Vascular: No JVD.  Cardiovascular:     Rate and Rhythm: Regular rhythm. Tachycardia present.     Heart sounds: Normal heart sounds. No murmur heard.    No friction rub. No gallop.  Pulmonary:     Effort: Respiratory distress present.     Breath sounds: Rhonchi and rales present. No wheezing.  Abdominal:     General: Bowel sounds are normal. There is no distension.     Palpations: Abdomen is soft. There is no mass.     Tenderness: There is no abdominal tenderness.  Musculoskeletal:        General: No tenderness. Normal range of motion.     Cervical back:  Normal range of motion and neck supple.     Right lower leg: Edema present.     Left lower leg: Edema present.  Lymphadenopathy:     Cervical: No cervical adenopathy.  Skin:    General: Skin is warm and dry.     Findings: No erythema or rash.  Neurological:     General: No focal deficit present.     Mental Status: He is alert.     Coordination: Coordination normal.     Comments: The patient has no focal weakness but he is morbidly obese and unable to even move  himself over to the stretcher, he cannot sit up in bed, he cannot even roll onto his side without significant assistance.  His grips are equal, strength at plantarflexion and dorsiflexion is normal, cranial nerves III through XII are normal  Psychiatric:        Behavior: Behavior normal.     ED Results / Procedures / Treatments   Labs (all labs ordered are listed, but only abnormal results are displayed) Labs Reviewed - No data to display  EKG None  Radiology No results found.  Procedures Procedures  {Document cardiac monitor, telemetry assessment procedure when appropriate:1}  Medications Ordered in ED Medications - No data to display  ED Course/ Medical Decision Making/ A&P   {   Click here for ABCD2, HEART and other calculatorsREFRESH Cooper before signing :1}                              Medical Decision Making Amount and/or Complexity of Data Reviewed Labs: ordered. Radiology: ordered. ECG/medicine tests: ordered.    This patient presents to the ED for concern of increasing shortness of breath, this involves an extensive number of treatment options, and is a complaint that carries with it a high risk of complications and morbidity.  The differential diagnosis includes fluid retention, congestive heart failure, acute on chronic respiratory failure, hypercapnic respiratory failure, pneumonia, effusions, seems unlikely to be pulmonary embolism given that he is anticoagulated and reports he has not run out of his  anticoagulant, could be dig toxicity   Co morbidities / Chronic conditions that complicate the patient evaluation  Morbidly obese Acute on chronic respiratory failure Congestive heart failure   Additional history obtained:  Additional history obtained from EMR External records from outside source obtained and reviewed including medical record including recent admissions   Lab Tests:  I Ordered, and personally interpreted labs.  The pertinent results include:  ***   Imaging Studies ordered:  I ordered imaging studies including ***  I independently visualized and interpreted imaging which showed *** I agree with the radiologist interpretation   Cardiac Monitoring: / EKG:  The patient was maintained on a cardiac monitor.  I personally viewed and interpreted the cardiac monitored which showed an underlying rhythm of: ***   Problem List / ED Course / Critical interventions / Medication management  *** I ordered medication including ***   Reevaluation of the patient after these medicines showed that the patient *** I have reviewed the patients home medicines and have made adjustments as needed   Consultations Obtained:  I requested consultation with the ***,  and discussed lab and imaging findings as well as pertinent plan - they recommend: ***   Social Determinants of Health:  ***   Test / Admission - Considered:  ***   {Document critical care time when appropriate:1} {Document review of labs and clinical decision tools ie heart score, Chads2Vasc2 etc:1}  {Document your independent review of radiology images, and any outside records:1} {Document your discussion with family members, caretakers, and with consultants:1} {Document social determinants of health affecting pt's care:1} {Document your decision making why or why not admission, treatments were needed:1} Final Clinical Impression(s) / ED Diagnoses Final diagnoses:  None    Rx / DC Orders ED  Discharge Orders     None

## 2024-02-06 NOTE — Assessment & Plan Note (Signed)
 Patient with encephalopathy, likely due to hypercapnia.  No signs of asterixis or ascites.  Trend LFT.

## 2024-02-06 NOTE — H&P (Signed)
 History and Physical    Patient: [name]:[id number] DOB: 05-Oct-1945 DOA: 02/06/2024 DOS: the patient was seen and examined on 02/06/2024 PCP: Medicine, [facility] (Inactive)  Patient coming from: Home  Chief Complaint:  Chief Complaint  Patient presents with   Shortness of Breath   HPI: [name] is a 78 y.o. male with medical history significant of hepatic cirrhosis, atrial flutter, GERD, heart failure and obesity who presented with respiratory distress.   EMS was called due to respiratory distress. Patient was found with increase work of breathing, he was placed on non re-breather and then transitioned to Bipap. He was reported to be awake and alert.  Recent hospitalization at [facility] from 01/04/24 to 01/11/24 with heart failure exacerbation.   At the time of my examination, patient is on Bipap full face mask, he is not responsive to touch or voice, very somnolent, not able to take any history.   Review of Systems: unable to review all systems due to the inability of the patient to answer questions. 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   Past Surgical History:  Procedure Laterality Date   APPENDECTOMY     ARTERY BIOPSY Left 12/30/2017    Procedure: BIOPSY TEMPORAL ARTERY;  Surgeon: Boyce Byes, MD;  Location: [facility] ORS;  Service: General;  Laterality: Left;   CHOLECYSTECTOMY     COLONOSCOPY W/ POLYPECTOMY     ESOPHAGOGASTRODUODENOSCOPY (EGD) WITH PROPOFOL   07/06/2012   Procedure: ESOPHAGOGASTRODUODENOSCOPY (EGD) WITH PROPOFOL ;  Surgeon: Mathew Solomon, MD;  Location: [facility];  Service: Endoscopy;  Laterality: N/A;   ESOPHAGOSCOPY     EYE SURGERY     left eye- muscle repair   HERNIA REPAIR     ventral hernia   INCISION AND DRAINAGE HIP Left 03/17/2018   Procedure: IRRIGATION AND DEBRIDEMENT LEFT THIGH WOUND;  Surgeon: Liliane Rei, MD;  Location: [facility] ORS;  Service: Orthopedics;  Laterality: Left;   JOINT REPLACEMENT     bilateral hips.  Right broke and had to be re placed again   MASS EXCISION N/A 03/02/2015   Procedure: EXCISION ABDOMINAL WALL MASS;  Surgeon: Juanita Norlander, MD;  Location: [facility] OR;  Service: General;  Laterality: N/A;   TOTAL HIP REVISION Left 02/17/2018   Procedure: Left total hip arthroplasty revision;  Surgeon: Liliane Rei, MD;  Location: [facility] ORS;  Service: Orthopedics;  Laterality: Left;   VERTICAL BANDED GASTROPLASTY     Social History:  reports that he quit smoking about 39 years ago. His smoking use included cigarettes. He started smoking about 57 years ago. He has a 35.6 pack-year smoking history. He has never used smokeless tobacco. He reports that he does  not currently use alcohol . He reports that he does not use drugs.  Allergies  Allergen Reactions   Bee Venom Anaphylaxis and Other (See Comments)    Noted from [place]. honey bee venom   Nickel Rash and Other (See Comments)    "Breaks me out"    Family History  Problem Relation Age of Onset   Cancer Mother    Alcohol  abuse Father     Prior to Admission medications   Medication Sig Start Date End Date Taking? Authorizing Provider  acetaminophen  (TYLENOL ) 500 MG tablet Take 1,000 mg by mouth as needed for mild pain (pain score 1-3) or  headache.    [provider]  alum & mag hydroxide-simeth (MAALOX/MYLANTA) 200-200-20 MG/5ML suspension Take 15 mLs by mouth every 6 (six) hours as needed for indigestion or heartburn.    [provider]  Carboxymethylcellulose Sodium (THERATEARS) 0.25 % SOLN Place 1-2 drops into both eyes as needed (Dry, irritated eyes.).    [provider]  digoxin  (LANOXIN ) 0.25 MG tablet Take 1 tablet (0.25 mg total) by mouth daily. 11/01/23   Justina Oman, MD  Ensure Max Protein (ENSURE MAX PROTEIN) LIQD Take 330 mLs (11 oz total) by mouth 2 (two) times daily. Patient taking differently: Take 11-22 oz by mouth 2 (two) times daily as needed. 12/07/18   Singh, Prashant K, MD  fludrocortisone  (FLORINEF ) 0.1 MG tablet Take 1 tablet (0.1 mg total) by mouth daily. 12/26/23   [place], Rema Care, DO  gabapentin  (NEURONTIN ) 300 MG capsule Take 1 capsule (300 mg total) by mouth 2 (two) times daily. Patient taking differently: Take 600-900 mg by mouth 2 (two) times daily. Take two capsules in the morning and three capsules at bedtime. 11/17/23   Gerald Kitty., NP  HYDROcodone -acetaminophen  (NORCO) 10-325 MG tablet Take 1 tablet by mouth every 6 (six) hours as needed for moderate pain (pain score 4-6). 12/26/23   [place], Rema Care, DO  levalbuterol  (XOPENEX  HFA) 45 MCG/ACT inhaler Inhale 2 puffs into the lungs every 6 (six) hours as needed for wheezing or shortness of breath. 10/31/23 10/30/24  Justina Oman, MD  Melatonin 3 MG TABS Take 2 tablets (6 mg total) by mouth at bedtime. 09/11/18   Krishnan, Gokul, MD  midodrine  (PROAMATINE ) 10 MG tablet Take 1 tablet (10 mg total) by mouth 3 (three) times daily with meals. Patient taking differently: Take 20 mg by mouth 2 (two) times daily. 11/17/23   Gerald Kitty., NP  Multiple Vitamins-Minerals (CENTRUM ADULTS PO) Take 1 tablet by mouth daily.    [provider]  OXYGEN Inhale 2 L into the lungs continuous.    [provider]   pantoprazole  (PROTONIX ) 40 MG tablet Take 1 tablet (40 mg total) by mouth daily. 09/02/18   Hongalgi, Anand D, MD  promethazine  (PHENERGAN ) 25 MG tablet Take 25 mg by mouth every 6 (six) hours as needed for nausea or vomiting. 09/22/23   [provider]  XARELTO  20 MG TABS tablet Take 20 mg by mouth every evening. 09/18/20   [provider]    Physical Exam: Vitals:   02/06/24 1908 02/06/24 1909 02/06/24 1944 02/06/24 2210  BP: (!) 126/52     Pulse: 65  62 73  Resp: 18  18 20   Temp: 97.9 F (36.6 C)     TempSrc: Temporal     SpO2: 95%  95% 93%  Weight:  (!) 151.8 kg    Height:  5\' 8"  (1.727 m)  BP (!) 126/52 (BP Location: Left Arm)   Pulse 73   Temp 97.9 F (36.6 C) (Temporal)   Resp 20   Ht 5\' 8"  (1.727 m)   Wt (!) 151.8 kg   SpO2 93%   BMI 50.88 kg/m   Neurology patient is not responsive to touch or voice, deconditioned and ill looking appearing.  ENT with mild pallor. Full face mask bipap in place Respiratory with bilateral rales with no wheezing or rhonchi  Heart with S1 and S2 present and regular with no gallops, rubs or murmurs No JVD Abdomen protuberant but not distended, non tender Lower extremity edema ++ proximal and + distal, cold lower extremities.   Data Reviewed:   ABG 7.33/ 73/ 101/ 38.5/ 98.6%  Na 133, K 4.4 Cl 92 bicarbonate 32, glucose 143, bun 54 cr 2,54  AST 439 ALT 403  BNP 782  High sensitive troponin 218  Wbc 8,2 hgb 10,8 plt 134   Chest radiograph with hypoinflation, positive cardiomegaly with bilateral hilar vascular congestion, bilateral basal atelectasis.   EKG 67 bpm, normal axis, normal intervals, qtc 463, sinus rhythm, no significant ST segment changes, negative T wave lead II, III, aVF, V2 to V6. (New)    Assessment and Plan: * Acute on chronic diastolic CHF (congestive heart failure) (HCC) Echocardiogram with preserved LV systolic function with EF 55%. Mild LVH, RV systolic function preserved, normal size LA and  RA, with no significant valvular disease.   Patient not on diuretic therapy at home Plan to start patient on IV furosemide  60 mg IV bid Continue midodrine  and fludrocortisone  for blood pressure support.   Acute hypoxemic and hypercapnic respiratory failure due to acute cardiogenic pulmonary edema.  Complicated with acute metabolic encephalopathy.   Plan to continue diuresis Non invasive mechanical ventilation with Bipap 14/6 with Fi02 30% tidal volumes generated around 600 ml. Aspiration precautions.   Paroxysmal atrial fibrillation (HCC) Continue telemetry monitoring  Continue digoxin   Anticoagulation with rivaroxaban .   Hypertension Continue blood pressure monitoring.  Continue midodrine  and fludrocortisone    CKD stage 3a, GFR 45-59 ml/min (HCC) Hyponatremia.   Continue diuresis with furosemide   Follow up renal function and electrolytes in am Avoid  hypotension and nephrotoxic medications   Cirrhosis of liver (HCC) Patient with encephalopathy, likely due to hypercapnia.  No signs of asterixis or ascites.  Trend LFT.   Obesity, class 3 Calculated BMI is 50.8    Advance Care Planning:   Code Status: Do not attempt resuscitation (DNR) PRE-ARREST INTERVENTIONS DESIRED   Consults: none   Family Communication: no family at the bedside   Severity of Illness: The appropriate patient status for this patient is INPATIENT. Inpatient status is judged to be reasonable and necessary in order to provide the required intensity of service to ensure the patient's safety. The patient's presenting symptoms, physical exam findings, and initial radiographic and laboratory data in the context of their chronic comorbidities is felt to place them at high risk for further clinical deterioration. Furthermore, it is not anticipated that the patient will be medically stable for discharge from the hospital within 2 midnights of admission.   * I certify that at the point of admission it is my  clinical judgment that the patient will require inpatient hospital care spanning beyond 2 midnights from the point of admission due to high intensity of service, high risk for further deterioration and high frequency of surveillance required.*  Author: Albertus Alt, MD 02/06/2024 10:31 PM  For on call review  [email or web address]

## 2024-02-06 NOTE — Assessment & Plan Note (Signed)
 Hyponatremia.   Continue diuresis with furosemide   Follow up renal function and electrolytes in am Avoid  hypotension and nephrotoxic medications

## 2024-02-06 NOTE — ED Triage Notes (Signed)
 Pt comes by EMS for sob. Pt wears  at home, unknown liters. By the time EMS arrived, first response was there and pt was placed on nonrebreathing and maintained at 97% but was still struggling to breathe so pt was placed on bipap. Pt's felt relief in his breathing effort. A&Ox4. Hx of CHF  EMS heard Rales, BP 109/62 HR 68

## 2024-02-06 NOTE — Assessment & Plan Note (Signed)
 Continue blood pressure monitoring.  Continue midodrine  and fludrocortisone 

## 2024-02-06 NOTE — Assessment & Plan Note (Signed)
Calculated BMI is 50.8  ?

## 2024-02-06 NOTE — Assessment & Plan Note (Deleted)
Calculated BMI is 50.8  ?

## 2024-02-06 NOTE — ED Notes (Signed)
 Arterial Blood gas completed at 1926 and carried to lab. Gas drawn on 44 % nasal cannula. No result as of note.

## 2024-02-06 NOTE — Assessment & Plan Note (Deleted)
 Continue telemetry monitoring  Continue digoxin   Anticoagulation with rivaroxaban .

## 2024-02-06 NOTE — Assessment & Plan Note (Signed)
 Continue telemetry monitoring  Continue digoxin   Anticoagulation with rivaroxaban .

## 2024-02-07 ENCOUNTER — Other Ambulatory Visit: Payer: Self-pay

## 2024-02-07 DIAGNOSIS — I5033 Acute on chronic diastolic (congestive) heart failure: Secondary | ICD-10-CM

## 2024-02-07 DIAGNOSIS — J9621 Acute and chronic respiratory failure with hypoxia: Secondary | ICD-10-CM

## 2024-02-07 DIAGNOSIS — K7460 Unspecified cirrhosis of liver: Secondary | ICD-10-CM | POA: Diagnosis not present

## 2024-02-07 DIAGNOSIS — N1831 Chronic kidney disease, stage 3a: Secondary | ICD-10-CM | POA: Diagnosis not present

## 2024-02-07 DIAGNOSIS — I480 Paroxysmal atrial fibrillation: Secondary | ICD-10-CM

## 2024-02-07 LAB — CBC
HCT: 36.3 % — ABNORMAL LOW (ref 39.0–52.0)
Hemoglobin: 11.1 g/dL — ABNORMAL LOW (ref 13.0–17.0)
MCH: 31.4 pg (ref 26.0–34.0)
MCHC: 30.6 g/dL (ref 30.0–36.0)
MCV: 102.8 fL — ABNORMAL HIGH (ref 80.0–100.0)
Platelets: 123 10*3/uL — ABNORMAL LOW (ref 150–400)
RBC: 3.53 MIL/uL — ABNORMAL LOW (ref 4.22–5.81)
RDW: 15.9 % — ABNORMAL HIGH (ref 11.5–15.5)
WBC: 7.6 10*3/uL (ref 4.0–10.5)
nRBC: 0 % (ref 0.0–0.2)

## 2024-02-07 LAB — COMPREHENSIVE METABOLIC PANEL WITH GFR
ALT: 388 U/L — ABNORMAL HIGH (ref 0–44)
AST: 369 U/L — ABNORMAL HIGH (ref 15–41)
Albumin: 2.8 g/dL — ABNORMAL LOW (ref 3.5–5.0)
Alkaline Phosphatase: 92 U/L (ref 38–126)
Anion gap: 10 (ref 5–15)
BUN: 54 mg/dL — ABNORMAL HIGH (ref 8–23)
CO2: 31 mmol/L (ref 22–32)
Calcium: 8.1 mg/dL — ABNORMAL LOW (ref 8.9–10.3)
Chloride: 94 mmol/L — ABNORMAL LOW (ref 98–111)
Creatinine, Ser: 2.27 mg/dL — ABNORMAL HIGH (ref 0.61–1.24)
GFR, Estimated: 29 mL/min — ABNORMAL LOW (ref >60)
Glucose, Bld: 106 mg/dL — ABNORMAL HIGH (ref 70–99)
Potassium: 4.1 mmol/L (ref 3.5–5.1)
Sodium: 135 mmol/L (ref 135–145)
Total Bilirubin: 0.8 mg/dL (ref 0.0–1.2)
Total Protein: 6.2 g/dL — ABNORMAL LOW (ref 6.5–8.1)

## 2024-02-07 LAB — URINALYSIS, ROUTINE W REFLEX MICROSCOPIC
Bilirubin Urine: NEGATIVE
Glucose, UA: NEGATIVE mg/dL
Hgb urine dipstick: NEGATIVE
Ketones, ur: NEGATIVE mg/dL
Leukocytes,Ua: NEGATIVE
Nitrite: NEGATIVE
Protein, ur: NEGATIVE mg/dL
Specific Gravity, Urine: 1.01 (ref 1.005–1.030)
pH: 5 (ref 5.0–8.0)

## 2024-02-07 LAB — BLOOD GAS, VENOUS
Acid-Base Excess: 9.4 mmol/L — ABNORMAL HIGH (ref 0.0–2.0)
Bicarbonate: 36.1 mmol/L — ABNORMAL HIGH (ref 20.0–28.0)
Drawn by: 1517
O2 Saturation: 86.8 %
Patient temperature: 36.5
pCO2, Ven: 56 mmHg (ref 44–60)
pH, Ven: 7.42 (ref 7.25–7.43)
pO2, Ven: 50 mmHg — ABNORMAL HIGH (ref 32–45)

## 2024-02-07 MED ORDER — MIDODRINE HCL 5 MG PO TABS
10.0000 mg | ORAL_TABLET | Freq: Three times a day (TID) | ORAL | Status: DC
  Administered 2024-02-07 – 2024-02-10 (×9): 10 mg via ORAL
  Filled 2024-02-07 (×9): qty 2

## 2024-02-07 MED ORDER — ADULT MULTIVITAMIN W/MINERALS CH
1.0000 | ORAL_TABLET | Freq: Every day | ORAL | Status: DC
  Administered 2024-02-08 – 2024-02-10 (×3): 1 via ORAL
  Filled 2024-02-07 (×3): qty 1

## 2024-02-07 MED ORDER — RIVAROXABAN 20 MG PO TABS
20.0000 mg | ORAL_TABLET | Freq: Every evening | ORAL | Status: DC
  Administered 2024-02-07 – 2024-02-09 (×3): 20 mg via ORAL
  Filled 2024-02-07 (×3): qty 1

## 2024-02-07 MED ORDER — FLUDROCORTISONE ACETATE 0.1 MG PO TABS
0.1000 mg | ORAL_TABLET | Freq: Every day | ORAL | Status: DC
  Administered 2024-02-07 – 2024-02-10 (×4): 0.1 mg via ORAL
  Filled 2024-02-07 (×6): qty 1

## 2024-02-07 MED ORDER — DIGOXIN 125 MCG PO TABS: 0.2500 mg | ORAL_TABLET | Freq: Every day | ORAL | Status: DC

## 2024-02-07 MED ORDER — HYDROCODONE-ACETAMINOPHEN 10-325 MG PO TABS
1.0000 | ORAL_TABLET | Freq: Four times a day (QID) | ORAL | Status: DC | PRN
  Administered 2024-02-08 – 2024-02-09 (×2): 1 via ORAL
  Filled 2024-02-07 (×2): qty 1

## 2024-02-07 MED ORDER — ONDANSETRON HCL 4 MG PO TABS
4.0000 mg | ORAL_TABLET | Freq: Four times a day (QID) | ORAL | Status: DC | PRN
  Administered 2024-02-08: 4 mg via ORAL
  Filled 2024-02-07: qty 1

## 2024-02-07 MED ORDER — ACETAMINOPHEN 650 MG RE SUPP: 650.0000 mg | Freq: Four times a day (QID) | RECTAL | Status: DC | PRN

## 2024-02-07 MED ORDER — LORAZEPAM 0.5 MG PO TABS
0.5000 mg | ORAL_TABLET | Freq: Four times a day (QID) | ORAL | Status: DC | PRN
  Administered 2024-02-09 – 2024-02-11 (×2): 0.5 mg via ORAL
  Filled 2024-02-07 (×2): qty 1

## 2024-02-07 MED ORDER — PANTOPRAZOLE SODIUM 40 MG PO TBEC
40.0000 mg | DELAYED_RELEASE_TABLET | Freq: Every day | ORAL | Status: DC
  Administered 2024-02-07 – 2024-02-10 (×4): 40 mg via ORAL
  Filled 2024-02-07 (×4): qty 1

## 2024-02-07 MED ORDER — ONDANSETRON HCL 4 MG/2ML IJ SOLN: 4.0000 mg | Freq: Four times a day (QID) | INTRAMUSCULAR | Status: DC | PRN

## 2024-02-07 MED ORDER — ACETAMINOPHEN 325 MG PO TABS: 650.0000 mg | ORAL_TABLET | Freq: Four times a day (QID) | ORAL | Status: DC | PRN

## 2024-02-07 MED ORDER — GABAPENTIN 300 MG PO CAPS
300.0000 mg | ORAL_CAPSULE | Freq: Two times a day (BID) | ORAL | Status: DC
  Administered 2024-02-07 – 2024-02-10 (×8): 300 mg via ORAL
  Filled 2024-02-07 (×8): qty 1

## 2024-02-07 MED ORDER — FUROSEMIDE 10 MG/ML IJ SOLN
60.0000 mg | Freq: Two times a day (BID) | INTRAMUSCULAR | Status: DC
  Administered 2024-02-07 – 2024-02-11 (×9): 60 mg via INTRAVENOUS
  Filled 2024-02-07 (×9): qty 6

## 2024-02-07 NOTE — Progress Notes (Signed)
 PROGRESS NOTE   [name]  WJX:[id number] DOB: 04-Nov-1945 DOA: 02/06/2024 PCP: Medicine, [facility] (Inactive)   Chief Complaint  Patient presents with   Shortness of Breath   Level of care: Telemetry  Brief Admission History:  78 year old male with past medical history for HFpEF, morbid obesity, hepatic cirrhosis, GERD, PAF on rivaroxaban , frequent hospitalizations.  Recently discharged from [facility] after a prolonged stay from 5/5 - 01/11/2024.  He is currently on home hospice care.  He recently contacted his cardiologist and requested not to be scheduled for further visits as he has transition to hospice care at home.  He has chronic hypotension on midodrine .  He is on rivaroxaban  for anticoagulation.  He presented to the emergency department 02/06/2024 complaining of increasing shortness of breath.  He reported that he had run out of his diuretic about a week ago.  He apparently was discharged on 80 mg of Lasix  daily but ran out about 1 week ago.  He has had increasing edema in the lower extremities increasing shortness of breath and was placed on nonrebreather by EMS and subsequently ended up on BiPAP.  He is DNR/DNI.  He was found to have an abnormally elevated serum digoxin  level.     Assessment and Plan:  Acute on chronic HFpEF Echocardiogram with preserved LV systolic function with EF 55%. Mild LVH, RV systolic function preserved, normal size LA and RA, with no significant valvular disease.  Patient not on diuretic therapy at home because he reportedly ran out 1 week ago He is now on home hospice care  Continue IV furosemide  60 mg IV bid Continue midodrine  and fludrocortisone  for blood pressure support.   Acute hypoxemic and hypercapnic respiratory failure  cardiogenic pulmonary edema.  CO2 Narcosis - he has a markedly elevated pCO2 and needs continued bipap therapy to lower pCO2 levels  - Pt remains DNRDNI  - Aspiration precautions.   CKD stage 3a, GFR 45-59  ml/min Continue diuresis with furosemide   Follow up renal function and electrolytes in am Avoid  hypotension and nephrotoxic medications   Hyponatremia - resolved   Obesity, class 3 Calculated BMI is 50.8   Cirrhosis of liver  Elevated Transaminases Patient with encephalopathy, due to hypercapnia.  No signs of asterixis or ascites.  Trend LFT and recheck ammonia in AM    Paroxysmal atrial fibrillation  Continue telemetry monitoring  Continue digoxin   Anticoagulation with rivaroxaban .   Hypertension Continue blood pressure monitoring.  Continue midodrine  and fludrocortisone    DVT prophylaxis: rivaroxaban  Code Status: DNR Family Communication:  Disposition: pending palliative discussions, TBD   Consultants:  Palliative care Procedures:   Antimicrobials:    Subjective: Pt obtunded on bipap   Objective: Vitals:   02/07/24 0730 02/07/24 0800 02/07/24 1030 02/07/24 1500  BP: 90/70 94/73 (!) 109/46 (!) 110/43  Pulse: (!) 52 (!) 54 (!) 48 (!) 50  Resp: 13 18 (!) 21 15  Temp:      TempSrc:      SpO2: 100% 99% 100% 99%  Weight:      Height:        Intake/Output Summary (Last 24 hours) at 02/07/2024 1703 Last data filed at 02/07/2024 1355 Gross per 24 hour  Intake 1652.69 ml  Output 550 ml  Net 1102.69 ml   Filed Weights   02/06/24 1909  Weight: (!) 151.8 kg   Examination:  General exam: obtunded on bipap, pt is morbidly obese, appears acutely and chronically ill in respiratory distress.   Respiratory  system: diffuse bibasilar crackles, rales heard, moderate increased WOB. Cardiovascular system: bradycardic rate, normal S1 & S2 heard. mild JVD, murmurs, rubs, gallops or clicks. 2+  pedal edema. Gastrointestinal system: Abdomen is obese, distended, soft and nontender. No organomegaly or masses felt. Normal bowel sounds heard. Central nervous system: obtunded. No focal neurological deficits. Extremities: diffuse pretibial/peripheral edema. Skin: No rashes,  lesions or ulcers. Psychiatry: Judgement and insight unable to assess. Mood & affect unable to assess.   Data Reviewed: I have personally reviewed following labs and imaging studies  CBC: Recent Labs  Lab 02/06/24 1902 02/07/24 0410  WBC 8.2 7.6  NEUTROABS 6.2  --   HGB 10.8* 11.1*  HCT 35.0* 36.3*  MCV 104.5* 102.8*  PLT 134* 123*    Basic Metabolic Panel: Recent Labs  Lab 02/06/24 1902 02/07/24 0410  NA 133* 135  K 4.4 4.1  CL 92* 94*  CO2 32 31  GLUCOSE 143* 106*  BUN 54* 54*  CREATININE 2.54* 2.27*  CALCIUM  8.0* 8.1*    CBG: No results for input(s): "GLUCAP" in the last 168 hours.  No results found for this or any previous visit (from the past 240 hours).   Radiology Studies: DG Chest Port 1 View Result Date: 02/06/2024 CLINICAL DATA:  Short of breath EXAM: PORTABLE CHEST 1 VIEW COMPARISON:  12/21/2023 FINDINGS: Single frontal view of the chest demonstrates an enlarged cardiac silhouette. Lung volumes are diminished, with pulmonary vascular congestion and patchy bilateral airspace disease consistent with edema. Chronic left basilar scarring and pleural thickening. No pneumothorax. No acute bony abnormalities. IMPRESSION: 1. Constellation of findings most consistent with mild congestive heart failure. Electronically Signed   By: Bobbye Burrow M.D.   On: 02/06/2024 19:33    Scheduled Meds:  fludrocortisone   0.1 mg Oral Daily   furosemide   60 mg Intravenous BID   gabapentin   300 mg Oral BID   midodrine   10 mg Oral TID WC   [START ON 02/08/2024] multivitamin with minerals  1 tablet Oral Daily   pantoprazole   40 mg Oral Daily   rivaroxaban   20 mg Oral QPM   Continuous Infusions:   LOS: 1 day   Time spent: 58 mins  Frank Johnston Lincoln Renshaw, MD How to contact the [facility] Attending or Consulting provider 7A - 7P or covering provider during after hours 7P -7A, for this patient?  Check the care team in [facility] and look for a) attending/consulting TRH provider listed and b) the TRH  team listed Log into [email or web address] to find provider on call.  Locate the TRH provider you are looking for under [facility] and page to a number that you can be directly reached. If you still have difficulty reaching the provider, please page the [facility] (Director on Call) for the Hospitalists listed on amion for assistance.  02/07/2024, 5:03 PM

## 2024-02-07 NOTE — Progress Notes (Signed)
   02/07/24 2308  BiPAP/CPAP/SIPAP  BiPAP/CPAP/SIPAP Pt Type Adult  BiPAP/CPAP/SIPAP DREAMSTATIOND  Mask Type Full face mask  Dentures removed? Not applicable  Mask Size Medium  Respiratory Rate 17 breaths/min  IPAP 18 cmH20  EPAP 8 cmH2O  Flow Rate 4 lpm  Patient Home Machine No  Patient Home Mask No  Patient Home Tubing No  Auto Titrate No  Device Plugged into RED Power Outlet Yes  BiPAP/CPAP /SiPAP Vitals  Pulse Rate (!) 57  Resp 17  SpO2 95 %  Bilateral Breath Sounds Clear  MEWS Score/Color  MEWS Score 0  MEWS Score Color Marrie Sizer

## 2024-02-07 NOTE — Hospital Course (Signed)
 77 year old male with past medical history for HFpEF, morbid obesity, hepatic cirrhosis, GERD, Permanent afib on rivaroxaban , OSA on CPAP, morbid obesity status post Roux-en-Y bypass frequent hospitalizations.  Recently discharged from [facility] after a prolonged stay from 5/5 - 01/11/2024.  He is currently on home hospice care.  He recently contacted his cardiologist and requested not to be scheduled for further visits as he has transition to hospice care at home.  He has chronic hypotension on midodrine .  He presented to the emergency department 02/06/2024 complaining of increasing shortness of breath.  He reported that he had run out of his diuretic about a week ago.  He apparently was discharged on 80 mg of Lasix  daily but ran out about 1 week ago.  He has had increasing edema in the lower extremities increasing shortness of breath and was placed on nonrebreather by EMS and subsequently ended up on BiPAP.  He is DNR/DNI.  He was found to have an abnormally elevated serum digoxin  level 2.4.  His digoxin  was held with treatment.  The patient was started on IV furosemide  60 mg twice daily.  The patient expressed that he wanted to resume hospice care.  Palliative medicine was consulted.  After discussions with the patient and daughter, the patient was transition to full comfort measures.  TOC and social work assisted in transitioning the patient to the next venue hospice care.

## 2024-02-08 DIAGNOSIS — K7460 Unspecified cirrhosis of liver: Secondary | ICD-10-CM | POA: Diagnosis not present

## 2024-02-08 DIAGNOSIS — N1831 Chronic kidney disease, stage 3a: Secondary | ICD-10-CM | POA: Diagnosis not present

## 2024-02-08 DIAGNOSIS — J9621 Acute and chronic respiratory failure with hypoxia: Secondary | ICD-10-CM | POA: Diagnosis not present

## 2024-02-08 DIAGNOSIS — I5033 Acute on chronic diastolic (congestive) heart failure: Secondary | ICD-10-CM | POA: Diagnosis not present

## 2024-02-08 LAB — COMPREHENSIVE METABOLIC PANEL WITH GFR
ALT: 362 U/L — ABNORMAL HIGH (ref 0–44)
AST: 298 U/L — ABNORMAL HIGH (ref 15–41)
Albumin: 2.6 g/dL — ABNORMAL LOW (ref 3.5–5.0)
Alkaline Phosphatase: 85 U/L (ref 38–126)
Anion gap: 11 (ref 5–15)
BUN: 50 mg/dL — ABNORMAL HIGH (ref 8–23)
CO2: 30 mmol/L (ref 22–32)
Calcium: 8.1 mg/dL — ABNORMAL LOW (ref 8.9–10.3)
Chloride: 95 mmol/L — ABNORMAL LOW (ref 98–111)
Creatinine, Ser: 1.83 mg/dL — ABNORMAL HIGH (ref 0.61–1.24)
GFR, Estimated: 38 mL/min — ABNORMAL LOW (ref >60)
Glucose, Bld: 106 mg/dL — ABNORMAL HIGH (ref 70–99)
Potassium: 4 mmol/L (ref 3.5–5.1)
Sodium: 136 mmol/L (ref 135–145)
Total Bilirubin: 0.9 mg/dL (ref 0.0–1.2)
Total Protein: 5.8 g/dL — ABNORMAL LOW (ref 6.5–8.1)

## 2024-02-08 LAB — AMMONIA: Ammonia: 27 umol/L (ref 9–35)

## 2024-02-08 LAB — CBC
HCT: 33.6 % — ABNORMAL LOW (ref 39.0–52.0)
Hemoglobin: 10.2 g/dL — ABNORMAL LOW (ref 13.0–17.0)
MCH: 31.1 pg (ref 26.0–34.0)
MCHC: 30.4 g/dL (ref 30.0–36.0)
MCV: 102.4 fL — ABNORMAL HIGH (ref 80.0–100.0)
Platelets: 129 10*3/uL — ABNORMAL LOW (ref 150–400)
RBC: 3.28 MIL/uL — ABNORMAL LOW (ref 4.22–5.81)
RDW: 16.2 % — ABNORMAL HIGH (ref 11.5–15.5)
WBC: 6 10*3/uL (ref 4.0–10.5)
nRBC: 0 % (ref 0.0–0.2)

## 2024-02-08 LAB — BRAIN NATRIURETIC PEPTIDE: B Natriuretic Peptide: 578 pg/mL — ABNORMAL HIGH (ref 0.0–100.0)

## 2024-02-08 NOTE — Progress Notes (Signed)
   02/08/24 2233  BiPAP/CPAP/SIPAP  BiPAP/CPAP/SIPAP Pt Type Adult  BiPAP/CPAP/SIPAP DREAMSTATIOND  Mask Type Full face mask  Dentures removed? Not applicable  Mask Size Medium  Respiratory Rate 17 breaths/min  IPAP 18 cmH20  EPAP 8 cmH2O  Flow Rate 5 lpm  Patient Home Machine No  Patient Home Mask No  Patient Home Tubing No  Auto Titrate No  Device Plugged into RED Power Outlet Yes  BiPAP/CPAP /SiPAP Vitals  Pulse Rate (!) 56  Resp 17  SpO2 94 %  Bilateral Breath Sounds Clear;Diminished  MEWS Score/Color  MEWS Score 0  MEWS Score Color Green

## 2024-02-08 NOTE — Plan of Care (Signed)
   Problem: Education: Goal: Knowledge of General Education information will improve Description Including pain rating scale, medication(s)/side effects and non-pharmacologic comfort measures Outcome: Progressing   Problem: Education: Goal: Knowledge of General Education information will improve Description Including pain rating scale, medication(s)/side effects and non-pharmacologic comfort measures Outcome: Progressing

## 2024-02-08 NOTE — Progress Notes (Signed)
 Nurse at bedside,patient was yelling out that his sheets were wet, he had spilled his drink..Patient cleaned up and provided a clean sheet to place over him.Plan of care om going.[name]

## 2024-02-08 NOTE — Progress Notes (Signed)
 Nurse at bedside,patient c/o nausea,patient given zofran  4 mg's by mouth,per MAR  prn.Plan of care on going.

## 2024-02-08 NOTE — Progress Notes (Signed)
 Nurse at bedside,patient had a 6 beat run of V-TACH/V-FIB on telemetry,blood pressure was 111/83,heart rate was 60,oxygen saturation on 4 liters was 90,Dr [employer]Plan of care on going.

## 2024-02-08 NOTE — Progress Notes (Signed)
 PROGRESS NOTE   [name]  ZOX:[id number] DOB: 1946-02-12 DOA: 02/06/2024 PCP: Medicine, [facility] (Inactive)   Chief Complaint  Patient presents with   Shortness of Breath   Level of care: Telemetry  Brief Admission History:  78 year old male with past medical history for HFpEF, morbid obesity, hepatic cirrhosis, GERD, PAF on rivaroxaban , frequent hospitalizations.  Recently discharged from [facility] after a prolonged stay from 5/5 - 01/11/2024.  He is currently on home hospice care.  He recently contacted his cardiologist and requested not to be scheduled for further visits as he has transition to hospice care at home.  He has chronic hypotension on midodrine .  He is on rivaroxaban  for anticoagulation.  He presented to the emergency department 02/06/2024 complaining of increasing shortness of breath.  He reported that he had run out of his diuretic about a week ago.  He apparently was discharged on 80 mg of Lasix  daily but ran out about 1 week ago.  He has had increasing edema in the lower extremities increasing shortness of breath and was placed on nonrebreather by EMS and subsequently ended up on BiPAP.  He is DNR/DNI.  He was found to have an abnormally elevated serum digoxin  level.     Assessment and Plan:  Acute on chronic HFpEF Echocardiogram with preserved LV systolic function with EF 55%. Mild LVH, RV systolic function preserved, normal size LA and RA, with no significant valvular disease.  Patient not on diuretic therapy at home because he reportedly ran out 1 week ago and not sure he wants to continue further medical care and focus more on hospice treatment.  He is now on home hospice care and he confirmed with me on 6/9 that he wants to continue hospice treatment  Continue IV furosemide  60 mg IV bid but patient not sure he wants to continue this  Continue midodrine  and fludrocortisone  for blood pressure support.   Acute hypoxemic and hypercapnic respiratory failure   cardiogenic pulmonary edema.  CO2 Narcosis - resolved after bipap treatment - he presented with a markedly elevated pCO2 and needs continued bipap therapy to lower pCO2 levels  - Pt remains DNRDNI and pt not sure he wants to continue acute care treatment and want to resume home hospice care  - Aspiration precautions.   CKD stage 3a, GFR 45-59 ml/min Continue diuresis with furosemide   Follow up renal function and electrolytes in am Avoid hypotension and nephrotoxic medications   Hyponatremia - resolved   Obesity, class 3 Calculated BMI is 50.8   Cirrhosis of liver  Elevated Transaminases Patient with encephalopathy, due to hypercapnia.  No signs of asterixis or ascites.  He is on home hospice care now    Paroxysmal atrial fibrillation  Continue telemetry monitoring  Continue digoxin   Anticoagulation with rivaroxaban .   Hypertension Continue blood pressure monitoring.  Continue midodrine  and fludrocortisone    DVT prophylaxis: rivaroxaban  Code Status: DNR Family Communication:  Disposition: pending palliative discussions, anticipate discharge home with resumption of hospice care    Consultants:  Palliative care Procedures:   Antimicrobials:    Subjective: Pt says he lives alone, he says he wants to continue hospice care and not sure he wants further acute care.  He is agreeable to speaking with palliative care team about it.  He says he is not sure he wants to even try to get more fluid off of his lungs with further medical treatments.  He says he has everything he needs at home with equipment provided by  hospice.       Objective: Vitals:   02/07/24 2308 02/08/24 0120 02/08/24 0621 02/08/24 0853  BP:  (!) 106/49 (!) (P) 104/49 (!) 122/46  Pulse: (!) 57 (!) 52 (!) (P) 54 75  Resp: 17 20    Temp:  (!) 97.5 F (36.4 C) (P) 98.4 F (36.9 C) 98.6 F (37 C)  TempSrc:  Oral (P) Oral Oral  SpO2: 95% 94% (P) 97% 96%  Weight:      Height:        Intake/Output  Summary (Last 24 hours) at 02/08/2024 1610 Last data filed at 02/07/2024 2241 Gross per 24 hour  Intake 572.28 ml  Output 1500 ml  Net -927.72 ml   Filed Weights   02/06/24 1909 02/07/24 2241  Weight: (!) 151.8 kg (!) 164.6 kg   Examination:  General exam: awake, alert, oriented x 3,  pt is morbidly obese, appears acutely and chronically ill in respiratory distress.   Respiratory system: improved bibasilar crackles, rales heard, No increased WOB. Cardiovascular system: bradycardic rate, normal S1 & S2 heard. mild JVD, murmurs, rubs, gallops or clicks. 2+  pedal edema. Gastrointestinal system: Abdomen is obese, distended, soft and nontender. No organomegaly or masses felt. Normal bowel sounds heard. Central nervous system: obtunded. No focal neurological deficits. Extremities: diffuse pretibial/peripheral edema. Skin: No rashes, lesions or ulcers. Psychiatry: Judgement and insight unable to assess. Mood & affect unable to assess.   Data Reviewed: I have personally reviewed following labs and imaging studies  CBC: Recent Labs  Lab 02/06/24 1902 02/07/24 0410 02/08/24 0259  WBC 8.2 7.6 6.0  NEUTROABS 6.2  --   --   HGB 10.8* 11.1* 10.2*  HCT 35.0* 36.3* 33.6*  MCV 104.5* 102.8* 102.4*  PLT 134* 123* 129*    Basic Metabolic Panel: Recent Labs  Lab 02/06/24 1902 02/07/24 0410 02/08/24 0259  NA 133* 135 136  K 4.4 4.1 4.0  CL 92* 94* 95*  CO2 32 31 30  GLUCOSE 143* 106* 106*  BUN 54* 54* 50*  CREATININE 2.54* 2.27* 1.83*  CALCIUM  8.0* 8.1* 8.1*    CBG: No results for input(s): "GLUCAP" in the last 168 hours.  No results found for this or any previous visit (from the past 240 hours).   Radiology Studies: DG Chest Port 1 View Result Date: 02/06/2024 CLINICAL DATA:  Short of breath EXAM: PORTABLE CHEST 1 VIEW COMPARISON:  12/21/2023 FINDINGS: Single frontal view of the chest demonstrates an enlarged cardiac silhouette. Lung volumes are diminished, with pulmonary  vascular congestion and patchy bilateral airspace disease consistent with edema. Chronic left basilar scarring and pleural thickening. No pneumothorax. No acute bony abnormalities. IMPRESSION: 1. Constellation of findings most consistent with mild congestive heart failure. Electronically Signed   By: Bobbye Burrow M.D.   On: 02/06/2024 19:33    Scheduled Meds:  fludrocortisone   0.1 mg Oral Daily   furosemide   60 mg Intravenous BID   gabapentin   300 mg Oral BID   midodrine   10 mg Oral TID WC   multivitamin with minerals  1 tablet Oral Daily   pantoprazole   40 mg Oral Daily   rivaroxaban   20 mg Oral QPM   Continuous Infusions:   LOS: 2 days   Time spent: 55 mins  Amdrew Oboyle Lincoln Renshaw, MD How to contact the [facility] Attending or Consulting provider 7A - 7P or covering provider during after hours 7P -7A, for this patient?  Check the care team in [facility] and look for a) attending/consulting  TRH provider listed and b) the TRH team listed Log into [email or web address] to find provider on call.  Locate the TRH provider you are looking for under [facility] and page to a number that you can be directly reached. If you still have difficulty reaching the provider, please page the [facility] (Director on Call) for the Hospitalists listed on amion for assistance.  02/08/2024, 9:03 AM

## 2024-02-08 NOTE — TOC Initial Note (Signed)
 Transition of Care [facility]) - Initial/Assessment Note    Patient Details  Name: [name] MRN: [id number] Date of Birth: [birth year]  Transition of Care [facility]) CM/SW Contact:    Cyndie Dredge, LCSWA Phone Number: 02/08/2024, 12:20 PM  Clinical Narrative:                  Patient is at risk for readmission. Patient was admitted for Acute on chronic diastolic CHF (congestive heart failure) . Writer assessed patient at bedside. Patient shared that he lives alone, has no family support, has a walker, WC, and hospital bed at home. Also states that he has hospice at home through [facility]. Writer attempted to contact [facility] and no response back yet. TOC to follow.   Expected Discharge Plan: Home w Hospice Care Barriers to Discharge: Continued Medical Work up   Patient Goals and CMS Choice Patient states their goals for this hospitalization and ongoing recovery are:: return back home CMS Medicare.gov Compare Post Acute Care list provided to:: Patient        Expected Discharge Plan and Services     Post Acute Care Choice: Durable Medical Equipment, Hospice Living arrangements for the past 2 months: Single Family Home                 DME Arranged: N/A DME Agency: NA                  Prior Living Arrangements/Services Living arrangements for the past 2 months: Single Family Home Lives with:: Self Patient language and need for interpreter reviewed:: Yes Do you feel safe going back to the place where you live?: Yes      Need for Family Participation in Patient Care: No (Comment) Care giver support system in place?: Yes (comment) Current home services: DME Criminal Activity/Legal Involvement Pertinent to Current Situation/Hospitalization: No - Comment as needed  Activities of Daily Living   ADL Screening (condition at time of admission) Independently performs ADLs?: No Does the patient have a NEW difficulty with bathing/dressing/toileting/self-feeding that is expected to last  >3 days?: Yes (Initiates electronic notice to provider for possible OT consult) Does the patient have a NEW difficulty with getting in/out of bed, walking, or climbing stairs that is expected to last >3 days?: Yes (Initiates electronic notice to provider for possible PT consult) Does the patient have a NEW difficulty with communication that is expected to last >3 days?: No Is the patient deaf or have difficulty hearing?: No Does the patient have difficulty seeing, even when wearing glasses/contacts?: No Does the patient have difficulty concentrating, remembering, or making decisions?: No  Permission Sought/Granted      Share Information with NAME: [name]     Permission granted to share info w Relationship: patient     Emotional Assessment Appearance:: Appears stated age, Disheveled Attitude/Demeanor/Rapport: Engaged Affect (typically observed): Accepting Orientation: : Oriented to Self, Oriented to Place, Oriented to Situation Alcohol  / Substance Use: Not Applicable Psych Involvement: No (comment)  Admission diagnosis:  Hepatic encephalopathy (HCC) [K76.82] Liver failure (HCC) [K72.90] Acute pulmonary edema (HCC) [J81.0] Acute and chronic respiratory failure with hypoxia (HCC) [J96.21] Acute renal failure superimposed on chronic kidney disease, unspecified acute renal failure type, unspecified CKD stage (HCC) [N17.9, N18.9] Patient Active Problem List   Diagnosis Date Noted   Acute and chronic respiratory failure with hypoxia (HCC) 02/07/2024   Liver failure (HCC) 02/06/2024   Hepatic encephalopathy (HCC) 02/06/2024   Cirrhosis of liver (HCC) 02/06/2024   Obesity, class  3 02/06/2024   CKD stage 3a, GFR 45-59 ml/min (HCC) 02/06/2024   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   Chronic respiratory failure with hypoxia (HCC) 12/21/2023   Elevated serum immunoglobulin free light chains 12/08/2023   Generalized weakness 11/06/2023   Lactic acidosis 11/06/2023    Uncomplicated opioid dependence (HCC) 11/06/2023   Orthostatic hypotension 10/31/2023   COVID-19 virus infection 09/26/2020   Near syncope 09/25/2020   Macrocytosis 09/25/2020   Paroxysmal atrial fibrillation (HCC) 09/25/2020   Goals of care, counseling/discussion    Palliative care encounter    Hyperkalemia 10/01/2018   Other cirrhosis of liver (HCC) 10/01/2018   Chronic back pain 10/01/2018   Cellulitis of left hip    Recurrent 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 09/04/2018   Physical debility 09/04/2018   Pleural effusion, bilateral    Hypertensive urgency 08/17/2018   Pleural effusion on left 08/17/2018   Acute on chronic diastolic CHF (congestive heart failure) (HCC) 08/17/2018   OSA on CPAP 08/17/2018   Chronic atrial fibrillation (HCC)    Delirium    Depression    OSA (obstructive sleep apnea)    Obesity, Class III, BMI 40-49.9 (morbid obesity)    Atrial fibrillation with RVR (HCC) 04/16/2018   Morbid obesity with BMI of 50.0-59.9, adult (HCC) 04/16/2018   AKI (acute kidney injury) (HCC) 04/16/2018   Visual hallucination 04/16/2018   SIRS (systemic inflammatory response syndrome) (HCC) 04/16/2018   Anemia of chronic disease 04/16/2018   Left hip postoperative wound infection 03/15/2018   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 [place] [address] Phone: [phone] Fax:  [phone]     Social Drivers of Health (SDOH) Social History: SDOH Screenings   Food Insecurity: No Food Insecurity (02/07/2024)  Housing: Low Risk  (02/07/2024)  Transportation Needs: No Transportation Needs (02/07/2024)  Utilities: Not At Risk (02/07/2024)  Financial Resource Strain: Low Risk  (09/15/2023)   Received from [facility]  Physical Activity: Sufficiently Active (01/11/2023)   Received from [facility]  Social Connections: Moderately Isolated (02/07/2024)  Stress: No Stress Concern Present (10/08/2023)   Received from [facility]  Tobacco Use: Medium Risk (01/07/2024)   Received from [facility]   SDOH Interventions:     Readmission Risk Interventions    02/08/2024   12:12 PM 11/09/2023   10:58 AM  Readmission Risk Prevention Plan  Transportation Screening Complete Complete  Home Care Screening  Complete  Medication Review (RN CM)  Complete  Medication Review Oceanographer) Complete   HRI or Home Care Consult Complete   SW Recovery Care/Counseling Consult Complete   Palliative Care Screening Complete   Skilled Nursing Facility Not Applicable

## 2024-02-08 NOTE — Progress Notes (Signed)
 Nurse at bedside,New IV started 22 gauge left forearm,times one attempt,patient tolerated procedure well.Plan of care on going.

## 2024-02-08 NOTE — Progress Notes (Signed)
 Nurse at bedside,patient accidentally pulled out IV, catheter intact.Patient cleaned up. Plan of care on going.

## 2024-02-08 NOTE — Plan of Care (Signed)
  Problem: Education: Goal: Knowledge of General Education information will improve Description: Including pain rating scale, medication(s)/side effects and non-pharmacologic comfort measures Outcome: Progressing   Problem: Clinical Measurements: Goal: Will remain free from infection Outcome: Progressing Goal: Diagnostic test results will improve Outcome: Progressing Goal: Respiratory complications will improve Outcome: Progressing Goal: Cardiovascular complication will be avoided Outcome: Progressing   Problem: Coping: Goal: Level of anxiety will decrease Outcome: Progressing   Problem: Elimination: Goal: Will not experience complications related to bowel motility Outcome: Progressing Goal: Will not experience complications related to urinary retention Outcome: Progressing   Problem: Pain Managment: Goal: General experience of comfort will improve and/or be controlled Outcome: Progressing

## 2024-02-09 ENCOUNTER — Inpatient Hospital Stay (HOSPITAL_COMMUNITY)

## 2024-02-09 ENCOUNTER — Encounter (HOSPITAL_COMMUNITY): Payer: Self-pay | Admitting: Family Medicine

## 2024-02-09 DIAGNOSIS — Z515 Encounter for palliative care: Secondary | ICD-10-CM

## 2024-02-09 DIAGNOSIS — Z7189 Other specified counseling: Secondary | ICD-10-CM

## 2024-02-09 DIAGNOSIS — Z789 Other specified health status: Secondary | ICD-10-CM

## 2024-02-09 DIAGNOSIS — I480 Paroxysmal atrial fibrillation: Secondary | ICD-10-CM

## 2024-02-09 DIAGNOSIS — K7460 Unspecified cirrhosis of liver: Secondary | ICD-10-CM | POA: Diagnosis not present

## 2024-02-09 DIAGNOSIS — I5033 Acute on chronic diastolic (congestive) heart failure: Secondary | ICD-10-CM | POA: Diagnosis not present

## 2024-02-09 DIAGNOSIS — Z66 Do not resuscitate: Secondary | ICD-10-CM

## 2024-02-09 DIAGNOSIS — N1831 Chronic kidney disease, stage 3a: Secondary | ICD-10-CM | POA: Diagnosis not present

## 2024-02-09 DIAGNOSIS — J9621 Acute and chronic respiratory failure with hypoxia: Secondary | ICD-10-CM | POA: Diagnosis not present

## 2024-02-09 LAB — DIGOXIN LEVEL: Digoxin Level: 1.6 ng/mL (ref 0.8–2.0)

## 2024-02-09 LAB — BASIC METABOLIC PANEL WITH GFR
Anion gap: 10 (ref 5–15)
BUN: 51 mg/dL — ABNORMAL HIGH (ref 8–23)
CO2: 34 mmol/L — ABNORMAL HIGH (ref 22–32)
Calcium: 8.2 mg/dL — ABNORMAL LOW (ref 8.9–10.3)
Chloride: 94 mmol/L — ABNORMAL LOW (ref 98–111)
Creatinine, Ser: 1.8 mg/dL — ABNORMAL HIGH (ref 0.61–1.24)
GFR, Estimated: 38 mL/min — ABNORMAL LOW (ref >60)
Glucose, Bld: 116 mg/dL — ABNORMAL HIGH (ref 70–99)
Potassium: 4 mmol/L (ref 3.5–5.1)
Sodium: 138 mmol/L (ref 135–145)

## 2024-02-09 LAB — TROPONIN I (HIGH SENSITIVITY)
Troponin I (High Sensitivity): 94 ng/L — ABNORMAL HIGH (ref <18)
Troponin I (High Sensitivity): 86 ng/L — ABNORMAL HIGH (ref <18)

## 2024-02-09 LAB — BRAIN NATRIURETIC PEPTIDE: B Natriuretic Peptide: 734 pg/mL — ABNORMAL HIGH (ref 0.0–100.0)

## 2024-02-09 MED ORDER — NITROGLYCERIN 0.4 MG SL SUBL: 0.4000 mg | SUBLINGUAL_TABLET | SUBLINGUAL | Status: DC | PRN

## 2024-02-09 MED ORDER — OXYCODONE HCL 20 MG/ML PO CONC
2.5000 mg | Freq: Three times a day (TID) | ORAL | Status: DC
  Administered 2024-02-09 – 2024-02-10 (×3): 2.6 mg via ORAL
  Filled 2024-02-09 (×3): qty 0.5

## 2024-02-09 MED ORDER — NITROGLYCERIN 0.4 MG SL SUBL
SUBLINGUAL_TABLET | SUBLINGUAL | Status: AC
  Filled 2024-02-09: qty 1

## 2024-02-09 MED ORDER — NITROGLYCERIN 0.4 MG SL SUBL
0.4000 mg | SUBLINGUAL_TABLET | SUBLINGUAL | Status: AC | PRN
  Administered 2024-02-09 (×3): 0.4 mg via SUBLINGUAL
  Filled 2024-02-09: qty 1

## 2024-02-09 MED ORDER — ALUM & MAG HYDROXIDE-SIMETH 200-200-20 MG/5ML PO SUSP
30.0000 mL | Freq: Once | ORAL | Status: AC
  Administered 2024-02-09: 30 mL via ORAL
  Filled 2024-02-09: qty 30

## 2024-02-09 NOTE — Progress Notes (Signed)
 PROGRESS NOTE   [name]  QMV:[id number] DOB: [birth year] DOA: 02/06/2024 PCP: Medicine, [facility] (Inactive)   Chief Complaint  Patient presents with   Shortness of Breath   Level of care: Telemetry  Brief Admission History:  78 year old male with past medical history for HFpEF, morbid obesity, hepatic cirrhosis, GERD, PAF on rivaroxaban , frequent hospitalizations.  Recently discharged from [facility] after a prolonged stay from 5/5 - 01/11/2024.  He is currently on home hospice care.  He recently contacted his cardiologist and requested not to be scheduled for further visits as he has transition to hospice care at home.  He has chronic hypotension on midodrine .  He is on rivaroxaban  for anticoagulation.  He presented to the emergency department 02/06/2024 complaining of increasing shortness of breath.  He reported that he had run out of his diuretic about a week ago.  He apparently was discharged on 80 mg of Lasix  daily but ran out about 1 week ago.  He has had increasing edema in the lower extremities increasing shortness of breath and was placed on nonrebreather by EMS and subsequently ended up on BiPAP.  He is DNR/DNI.  He was found to have an abnormally elevated serum digoxin  level.     Assessment and Plan:  Acute on chronic HFpEF Echocardiogram with preserved LV systolic function with EF 55%. Mild LVH, RV systolic function preserved, normal size LA and RA, with no significant valvular disease.  Patient not on diuretic therapy at home because he reportedly ran out 1 week ago and not sure he wants to continue further medical care and focus more on hospice treatment.  He is now on home hospice care and he confirmed with me on 6/9 that he wants to continue home hospice treatment  Continue IV furosemide  60 mg IV bid today and hopefully home tomorrow to resume home hospice care.   Continue midodrine  and fludrocortisone  for blood pressure support.   Acute hypoxemic and  hypercapnic respiratory failure  cardiogenic pulmonary edema.  CO2 Narcosis - resolved after bipap treatment - he presented with a markedly elevated pCO2 and needs continued bipap therapy to lower pCO2 levels  - Pt remains DNRDNI and pt wants to resume home hospice care  - Aspiration precautions.  - he says he feels terrible today.  As above, continue IV diuresis and hopefully will feel well enough to return home tomorrow to resume hospice care  CKD stage 3a, GFR 45-59 ml/min Continue diuresis with furosemide   Follow up renal function and electrolytes in am Avoid hypotension and nephrotoxic medications   Digoxin  toxicity  - presented with elevated serum digoxin  levels - we have stopped giving it to allow for it to wash out - he has had symptoms from this and not reporting feeling terrible and having a tremor - he is having night time bradycardia spells  Hyponatremia - resolved   Obesity, class 3 Calculated BMI is 50.8   Cirrhosis of liver  Elevated Transaminases Patient with encephalopathy, due to hypercapnia.  No signs of asterixis or ascites.  He is on home hospice care now    Paroxysmal atrial fibrillation  Continue telemetry monitoring  He had been on digoxin  for this because he could not tolerate metoprolol  or other AV nodal blockers He is currently having bradycardic episodes and we will hold digoxin  to allow for it to wash out  Anticoagulation with rivaroxaban .   Hypertension Continue blood pressure monitoring.  Continue midodrine  and fludrocortisone    DVT prophylaxis: rivaroxaban  Code Status:  DNR Communication: long discussion with patient about his wishes and plan to return home with hospice support Disposition: pending palliative discussions, anticipate discharge home tomorrow with resumption of hospice care    Consultants:  Palliative care Procedures:   Antimicrobials:    Subjective: Pt says he feels "terrible" today.  He has had an intentional tremor  that affects his ability to use his phone.        Objective: Vitals:   02/09/24 0323 02/09/24 0547 02/09/24 0903 02/09/24 1104  BP: (!) 128/54 (!) 105/47 106/67 (!) 117/48  Pulse: (!) 49  (!) 59 68  Resp: (!) 22 20  19   Temp: 97.7 F (36.5 C) 97.6 F (36.4 C) 99.2 F (37.3 C) 98.5 F (36.9 C)  TempSrc: Oral Oral Oral Oral  SpO2: (!) 85% 100% 96% 96%  Weight:      Height:        Intake/Output Summary (Last 24 hours) at 02/09/2024 1153 Last data filed at 02/09/2024 0600 Gross per 24 hour  Intake 240 ml  Output 1450 ml  Net -1210 ml   Filed Weights   02/06/24 1909 02/07/24 2241  Weight: (!) 151.8 kg (!) 164.6 kg   Examination:  General exam: awake, alert, oriented x 3,  pt is morbidly obese, appears acutely and chronically ill in respiratory distress.   Respiratory system: improved bibasilar crackles, rales heard, No increased WOB. Cardiovascular system: bradycardic rate, normal S1 & S2 heard. mild JVD, murmurs, rubs, gallops or clicks. 2+  pedal edema. Gastrointestinal system: Abdomen is obese, distended, soft and nontender. No organomegaly or masses felt. Normal bowel sounds heard. Central nervous system: obtunded. No focal neurological deficits. Extremities: diffuse pretibial/peripheral edema. Skin: No rashes, lesions or ulcers. Psychiatry: Judgement and insight unable to assess. Mood & affect unable to assess.   Data Reviewed: I have personally reviewed following labs and imaging studies  CBC: Recent Labs  Lab 02/06/24 1902 02/07/24 0410 02/08/24 0259  WBC 8.2 7.6 6.0  NEUTROABS 6.2  --   --   HGB 10.8* 11.1* 10.2*  HCT 35.0* 36.3* 33.6*  MCV 104.5* 102.8* 102.4*  PLT 134* 123* 129*    Basic Metabolic Panel: Recent Labs  Lab 02/06/24 1902 02/07/24 0410 02/08/24 0259 02/09/24 0441  NA 133* 135 136 138  K 4.4 4.1 4.0 4.0  CL 92* 94* 95* 94*  CO2 32 31 30 34*  GLUCOSE 143* 106* 106* 116*  BUN 54* 54* 50* 51*  CREATININE 2.54* 2.27* 1.83* 1.80*   CALCIUM  8.0* 8.1* 8.1* 8.2*    CBG: No results for input(s): "GLUCAP" in the last 168 hours.  No results found for this or any previous visit (from the past 240 hours).   Radiology Studies: No results found.  Scheduled Meds:  fludrocortisone   0.1 mg Oral Daily   furosemide   60 mg Intravenous BID   gabapentin   300 mg Oral BID   midodrine   10 mg Oral TID WC   multivitamin with minerals  1 tablet Oral Daily   pantoprazole   40 mg Oral Daily   rivaroxaban   20 mg Oral QPM   Continuous Infusions:   LOS: 3 days   Time spent: 55 mins  Alyssia Heese Lincoln Renshaw, MD How to contact the [facility] Attending or Consulting provider 7A - 7P or covering provider during after hours 7P -7A, for this patient?  Check the care team in [facility] and look for a) attending/consulting TRH provider listed and b) the TRH team listed Log into [email or web address] to find  provider on call.  Locate the TRH provider you are looking for under [facility] and page to a number that you can be directly reached. If you still have difficulty reaching the provider, please page the [facility] (Director on Call) for the Hospitalists listed on amion for assistance.  02/09/2024, 11:53 AM

## 2024-02-09 NOTE — Progress Notes (Addendum)
 Nurse at bedside,patient given nitroglycerin 0.4 mg's sublingual times three for chest pain,per Dr Anselmo Bast orders. Patient still c/o of chest pain rated a 10, Patient also given ativan  0.5 mg's by mouth to help him relax,per MAR prn.Plan of care on going.

## 2024-02-09 NOTE — Progress Notes (Signed)
 Nurse at bedside,patient stated"The medication seem to help some now".Patient rating pain a 4 now..Plan of care on going.

## 2024-02-09 NOTE — Consult Note (Signed)
 Consultation Note Date: 02/09/2024   Patient Name: [name]  DOB: [birth year]  MRN: [id number]  Age / Sex: 78 y.o., male  PCP: Medicine, [facility] (Inactive) Referring Physician: Rayfield Cairo, MD  Reason for Consultation: Establishing goals of care  HPI/Patient Profile: 78 y.o. male  with past medical history of HFpEF, morbid obesity, hepatic cirrhosis, GERD, PAF on rivaroxaban , OSA on CPAP, arthritis, frequent hospitalizations. admitted on 02/06/2024 with increased shortness of breath and lower extremity edema.   Diagnosed with acute on chronic HFpEF with CO2 narcosis in the setting of not being on diuretic.  Initially required BiPAP but now weaned back down to supplemental O2 via nasal cannula.  Also noted to have abnormally elevated serum digoxin  level.  He is a hospice patient with recent enrollment.  Currently on IV diuretic.  PMT has been consulted to assist with goals of care conversation.  Clinical Assessment and Goals of Care:  I have reviewed medical records including EPIC notes, labs (independently reviewed) and imaging, outside hospital notes, external hospice notes, assessed the patient and then met with patient and Ex-wife Floretta Huron; she states she also has POA) to discuss diagnosis prognosis, GOC, EOL wishes, disposition and options.  I introduced Palliative Medicine as specialized medical care for people living with serious illness. It focuses on providing relief from the symptoms and stress of a serious illness. The goal is to improve quality of life for both the patient and the family.  We discussed a brief life review of the patient and then focused on their current illness.   I attempted to elicit values and goals of care important to the patient.    Medical History Review and Family/Patient Understanding:  Patient and family member appear to have a good understanding of his overall health status.  He  understands that he has recurrent issues with shortness of breath and that his digoxin  level was elevated.  We also discussed that he continues to struggle with volume overload despite being on optimal medical therapies.  He shares that even though he receives good care at the hospital it never fixes his underlying problem and seems understand that there is really not much we can do to fix the problem.  Social History: Lives at home alone with a paid caregiver who helps.  Unfortunately, this help has been somewhat fragmented.  He is requiring more more assistance at home.  He remains in the bed or can transfer to the recliner but is no longer ambulatory.  He admits he is having a lot more trouble managing at home.  Both he and family member feel that he needs additional care at home and are hopeful for transition to facility under hospice where he can receive 24-hour supervision.  Functional and Nutritional State: Largely bed/chair bound, able to transfer with some assistance, needing more help at home.  He has decreased appetite and also has trouble feeding himself due to increased spillage.  Palliative Symptoms: Shortness of breath which is ongoing.  He also reports occasional pain.  He also reports some visual hallucinations.  These are not particularly distressing for him.  Advance Directives: A detailed discussion regarding advanced directives was had.  See below.   Code Status: He confirms DNR status and states he would not want ventilatory support.  Therefore CODE STATUS to be DNR/DNI  Discussion: Spoke extensively with patient and family at bedside regarding his health and of acute on chronic heart failure in the setting of his other  chronic medical issues.  We discussed that despite optimal medical therapies, he continues to struggle greatly with shortness of breath and volume overload.  He readily shares that while he receives great care at the hospital it does not fix his underlying  problem.  He expresses a desire to avoid coming back to the hospital and more focus on a comfort approach.  He has followed by [facility] hospice as an outpatient.  He is from home but is needing more assistance.  His family is working with him and a Child psychotherapist to get Medicaid with the plan to transition to a facility under hospice.  Will likely discharge home once volume status is more stable with hospice support and ultimately transition to facility.  We also discussed his symptoms.  He states that he has tried morphine  in the past for his shortness of breath but that it made him really out of it.  He wants to be more functional and alert and prefers not to take morphine  for this reason.  A lower dose open opiate with this shortness of breath and pain he tolerates it.  CODE STATUS clarified and he states that he wishes to be a DNR/DNI clarifies that he would not want to be placed on a ventilator.  Discussed the importance of continued conversation with family and the medical providers regarding overall plan of care and treatment options, ensuring decisions are within the context of the patient's values and GOCs.   Questions and concerns were addressed. The family was encouraged to call with questions or concerns.  PMT will continue to support holistically.   PATIENT (ex-wife/reported- POA Floretta Huron)    SUMMARY OF RECOMMENDATIONS    OT consult for increased spillage with meals  Continue diuresis Scheduled opiates for shortness of breath and pain.  See below. Plan will be to discharge home with hospice support [name]) with ultimate goal of admission to a facility  Code Status/Advance Care Planning: DNR   Symptom Management:  Continue Tylenol  as needed Continue Norco as needed Continue Ativan  as needed Continue Zofran  as needed Continue Pantoprazole   Added oxycodone  2.6 mg 3 times daily for pain and shortness of breath   Prognosis:  < 6 months  Discharge Planning: Home with Hospice       Primary Diagnoses: Present on Admission:  Acute on chronic diastolic CHF (congestive heart failure) (HCC)  Hypertension  Depression  Gastroesophageal reflux disease  Paroxysmal atrial fibrillation (HCC)  Acute and chronic respiratory failure with hypoxia (HCC)    Physical Exam  Vital Signs: BP (!) 117/48 (BP Location: Left Arm)   Pulse 68   Temp 98.5 F (36.9 C) (Oral)   Resp 19   Ht 5\' 8"  (1.727 m)   Wt (!) 164.6 kg   SpO2 96%   BMI 55.18 kg/m  Pain Scale: 0-10 POSS *See Group Information*: S-Acceptable,Sleep, easy to arouse Pain Score: 0-No pain   SpO2: SpO2: 96 % O2 Device:SpO2: 96 % O2 Flow Rate: .O2 Flow Rate (L/min): 4 L/min   Palliative Assessment/Data: 40%    Total time: I spent 75 minutes in the care of the patient today in the above activities and documenting the encounter.   Render Carrie, NP  Palliative Medicine Team Team phone # [phone]  Thank you for allowing the Palliative Medicine Team to assist in the care of this patient. Please utilize secure chat with additional questions, if there is no response within 30 minutes please call the above phone number.  Palliative Medicine  Team providers are available by phone from 7am to 7pm daily and can be reached through the team cell phone.  Should this patient require assistance outside of these hours, please call the patient's attending physician.

## 2024-02-09 NOTE — Plan of Care (Signed)
   Problem: Education: Goal: Knowledge of General Education information will improve Description Including pain rating scale, medication(s)/side effects and non-pharmacologic comfort measures Outcome: Progressing   Problem: Education: Goal: Knowledge of General Education information will improve Description Including pain rating scale, medication(s)/side effects and non-pharmacologic comfort measures Outcome: Progressing

## 2024-02-09 NOTE — Progress Notes (Addendum)
 Nurse at bedside,patient c/o chest pain rated a 9,sharp,constant,patient given Hydrocodone  -acetaminophen  one tablet by mouth for pain per MAR prn.Blood pressure 96/66 heart rate 67.Dr Lincoln Renshaw notified.[place] Greaser of care on going.

## 2024-02-09 NOTE — Progress Notes (Signed)
 Patient Pulse 35-40 by pulse oximeter, manually confirmed with Radial and apical pulse, 50-55 on Telemetry, MD made aware, Digoxin  has been stopped earlier in the day, 12 leads EKG done, no new intermentions ordered by MD, will continue to monitor.

## 2024-02-10 DIAGNOSIS — E66813 Obesity, class 3: Secondary | ICD-10-CM | POA: Diagnosis not present

## 2024-02-10 DIAGNOSIS — Z66 Do not resuscitate: Secondary | ICD-10-CM | POA: Diagnosis not present

## 2024-02-10 DIAGNOSIS — N1831 Chronic kidney disease, stage 3a: Secondary | ICD-10-CM | POA: Diagnosis not present

## 2024-02-10 DIAGNOSIS — J9621 Acute and chronic respiratory failure with hypoxia: Secondary | ICD-10-CM | POA: Diagnosis not present

## 2024-02-10 DIAGNOSIS — I5033 Acute on chronic diastolic (congestive) heart failure: Secondary | ICD-10-CM | POA: Diagnosis not present

## 2024-02-10 DIAGNOSIS — Z515 Encounter for palliative care: Secondary | ICD-10-CM | POA: Diagnosis not present

## 2024-02-10 DIAGNOSIS — Z789 Other specified health status: Secondary | ICD-10-CM | POA: Diagnosis not present

## 2024-02-10 LAB — CBC
HCT: 36.5 % — ABNORMAL LOW (ref 39.0–52.0)
Hemoglobin: 11.2 g/dL — ABNORMAL LOW (ref 13.0–17.0)
MCH: 32.5 pg (ref 26.0–34.0)
MCHC: 30.7 g/dL (ref 30.0–36.0)
MCV: 105.8 fL — ABNORMAL HIGH (ref 80.0–100.0)
Platelets: 124 10*3/uL — ABNORMAL LOW (ref 150–400)
RBC: 3.45 MIL/uL — ABNORMAL LOW (ref 4.22–5.81)
RDW: 16.6 % — ABNORMAL HIGH (ref 11.5–15.5)
WBC: 7.2 10*3/uL (ref 4.0–10.5)
nRBC: 0 % (ref 0.0–0.2)

## 2024-02-10 LAB — BASIC METABOLIC PANEL WITH GFR
Anion gap: 9 (ref 5–15)
BUN: 44 mg/dL — ABNORMAL HIGH (ref 8–23)
CO2: 32 mmol/L (ref 22–32)
Calcium: 8.3 mg/dL — ABNORMAL LOW (ref 8.9–10.3)
Chloride: 94 mmol/L — ABNORMAL LOW (ref 98–111)
Creatinine, Ser: 1.66 mg/dL — ABNORMAL HIGH (ref 0.61–1.24)
GFR, Estimated: 42 mL/min — ABNORMAL LOW (ref >60)
Glucose, Bld: 116 mg/dL — ABNORMAL HIGH (ref 70–99)
Potassium: 4.1 mmol/L (ref 3.5–5.1)
Sodium: 135 mmol/L (ref 135–145)

## 2024-02-10 LAB — MAGNESIUM: Magnesium: 2.7 mg/dL — ABNORMAL HIGH (ref 1.7–2.4)

## 2024-02-10 MED ORDER — MORPHINE SULFATE (PF) 2 MG/ML IV SOLN
2.0000 mg | INTRAVENOUS | Status: DC | PRN
  Administered 2024-02-11: 2 mg via INTRAVENOUS
  Filled 2024-02-10: qty 1

## 2024-02-10 MED ORDER — SODIUM CHLORIDE 0.9% FLUSH: 3.0000 mL | INTRAVENOUS | Status: DC | PRN

## 2024-02-10 MED ORDER — OXYCODONE HCL 20 MG/ML PO CONC
5.0000 mg | Freq: Three times a day (TID) | ORAL | Status: DC
  Administered 2024-02-10 (×2): 5 mg via ORAL
  Filled 2024-02-10 (×2): qty 0.5

## 2024-02-10 MED ORDER — SODIUM CHLORIDE 0.9% FLUSH
3.0000 mL | Freq: Two times a day (BID) | INTRAVENOUS | Status: DC
  Administered 2024-02-10 (×2): 10 mL via INTRAVENOUS

## 2024-02-10 NOTE — Plan of Care (Signed)

## 2024-02-10 NOTE — Progress Notes (Signed)
 Daily Progress Note   Patient Name: [name]       Date: 02/10/2024 DOB: 12/23/1945  Age: 78 y.o. MRN#: [id number] Attending Physician: Demaris Fillers, MD Primary Care Physician: Medicine, [facility] (Inactive) Admit Date: 02/06/2024  Reason for Consultation/Follow-up: Establishing goals of care, symptom management  Subjective:  78 y.o. male  with past medical history of HFpEF, morbid obesity, hepatic cirrhosis, GERD, PAF on rivaroxaban , OSA on CPAP, arthritis, frequent hospitalizations. admitted on 02/06/2024 with increased shortness of breath and lower extremity edema.    Diagnosed with acute on chronic HFpEF with CO2 narcosis in the setting of not being on diuretic.  Initially required BiPAP but now weaned back down to supplemental O2 via nasal cannula.  Also noted to have abnormally elevated serum digoxin  level.  He is a hospice patient with recent enrollment.  Currently on IV diuretic.  Renal function relatively stable despite high-dose IV diuretics.  Troponin levels collected 02/09/2024 x 2 slightly elevated but downtrending.  Other labs reviewed and noncontributory  Today, when asked how patient is doing he states he is not doing well at all.  He is unable to specifically describe what is bothering him.  He did have some chest pain last night but denies any currently.  He feels his breathing is now better since he has been on IV diuretics.  When asked to further describe what is bothering him, he states that he just feels like nothing is getting better.  He feels his quality of life is poor and is tired of being sick.  He states that he has made peace with the George Kinder and is ready to die.  He states that he has had a very hard life and is ready to no longer be suffering.  He is interested in transitioning to full comfort care and  does not wish to pursue any further life-prolonging therapies including medications.  He is interested in focusing more on comfort and quality of life and is willing to try medications to help feel better.  We discussed what comfort care would look like including discontinuing noncomfort medicines and initiating and scheduling some comfort meds.  We discussed that given his goals of care are comfort and quality of life and he does not wish to pursue any further life-prolonging measures, transitioning to hospice at discharge remains very appropriate.  Location of where hospice services will take place at discharge is not clear at this time.  When asked how we can support him other than making the change to comfort care, he request that we pray for him and is interested in  spiritual care follow-up.  I also spoke at length with Floretta Huron and Leonce Ralph and provided an update.  They are very thankful for this update.  Both feel that transition to full comfort measures, attempt to optimize quality of life, and discontinuing any life-prolonging therapies are appropriate and they are agreement.  They continue to work towards getting him a safe disposition option.  His daughter Cain Castillo is working with social work (both at the hospital and at hospice) as well as APS, to obtain Medicaid in hopes of transitioning him to somewhere with 24-hour care as they are both very concerned about his ability to return home since he lives alone with paid but not continuous caregiver support.  Length of Stay: 4   Physical Exam Constitutional:      General: He is not in acute distress.    Appearance: He is ill-appearing. He is not toxic-appearing.  Pulmonary:     Effort: Pulmonary effort is normal. No tachypnea.  Skin:    General: Skin is warm and dry.     Capillary Refill: Capillary refill takes more than 3 seconds.  Neurological:     Mental Status: He is alert.     Comments: Has great difficulty managing phone,  bringing cup to mouth, and eating due to decreased hand coordination  Psychiatric:        Attention and Perception: Attention normal.             Vital Signs: BP (!) 122/49 (BP Location: Left Arm)   Pulse (!) 56   Temp 97.9 F (36.6 C)   Resp 20   Ht 5' 8 (1.727 m)   Wt (!) 164.6 kg   SpO2 96%   BMI 55.18 kg/m  SpO2: SpO2: 96 % O2 Device: O2 Device: CPAP O2 Flow Rate: O2 Flow Rate (L/min): 4 L/min      Palliative Assessment/Data: 40%   Palliative Care Assessment & Plan   Patient Profile/assessment:  77 year old male with numerous chronic medical problems being admitted for acute on chronic heart failure exacerbation.  Required BiPAP and IV diuresis.  Now on O2 via nasal cannula with BiPAP at night.  He has had recurrent hospitalizations and there is very little else from a medical perspective we can do to optimize his heart failure and prevent further exacerbations.  Felt to be at end-stage heart failure.  He was enrolled on hospice for end-of-life care for CHF prior to this hospitalization.  Tentative plan initially was to stabilize volume status enough to return home with hospice however, today patient repeatedly expresses a desire to be full comfort care and both daughter and ex-wife/reported POA report significant concerns about his ability to return home in his current status.  He also shares this concern.  Daughter continues to work with social work in the hopes of establishing some sort of residential care with hospice support upon discharge.  Given his chest pain last night and decision to no longer pursue life-prolonging therapies he may have a more rapid decline and may ultimately meet criteria for inpatient hospice.   Recommendations/Plan: Continue DNR/DNI Transition to full comfort measures Discontinue medications not aimed at comfort (Xarelto , midodrine , multivitamin, and fludrocortisone  discontinued) Adjust symptom meds for better symptom control.  See below for more  details. Continue to collaborate with social work to determine disposition options ultimately, goal is to discharge with hospice support although location where this care will be provided needs further clarification  OT consult ordered 02/09/2024 due to hand spasms and  increased spillage with meals PMT to reassess in 24 hours to assess symptom management and determine whether or not he would be appropriate for discharge to inpatient hospice Spiritual care consult Liberalize diet to regular no fluid restriction  Symptom management: Oxycodone  liquid increased from 2.6 mg 3 times daily to 5 mg 3 times daily Added morphine  2 mg  IV every 4 hours as needed pain, shortness of breath, or recurrent chest pain  Continue IV Lasix  as this is helping manage volume status Continue Tylenol  as needed Continue gabapentin  Continue Norco as needed Continue nitro as needed Continue Ativan  as needed Continue Zofran  as needed  Prognosis:  Unable to determine, need time for outcomes  Discharge Planning: To Be Determined  Care plan was discussed with attending, nursing staff, social work, Floretta Huron, and Leonce Ralph.  Extensive chart review was also completed including review of epic notes, labs (independently reviewed), imaging, EKG, vital signs, flowsheets, and medications.   Billing based on MDM: High  Problems Addressed: One acute or chronic illness or injury that poses a threat to life or bodily function  Amount and/or Complexity of Data: Category 2:Independent interpretation of a test performed by another physician/other qualified health care professional (not separately reported)  Risks: Parenteral controlled substances and Decision not to resuscitate or to de-escalate care because of poor prognosis          Render Carrie, NP  Palliative Medicine Team Team phone # [phone]  Thank you for allowing the Palliative Medicine Team to assist in the care of this patient. Please utilize  secure chat with additional questions, if there is no response within 30 minutes please call the above phone number.  Palliative Medicine Team providers are available by phone from 7am to 7pm daily and can be reached through the team cell phone.  Should this patient require assistance outside of these hours, please call the patient's attending physician.

## 2024-02-10 NOTE — Progress Notes (Signed)
 PROGRESS NOTE  [name] [name]:[id number] DOB: May 09, 1946 DOA: 02/06/2024 PCP: Medicine, [facility] (Inactive)  Brief History:  78 year old male with past medical history for HFpEF, morbid obesity, hepatic cirrhosis, GERD, Permanent afib on rivaroxaban , OSA on CPAP, morbid obesity status post Roux-en-Y bypass frequent hospitalizations.  Recently discharged from [facility] after a prolonged stay from 5/5 - 01/11/2024.  He is currently on home hospice care.  He recently contacted his cardiologist and requested not to be scheduled for further visits as he has transition to hospice care at home.  He has chronic hypotension on midodrine .  He presented to the emergency department 02/06/2024 complaining of increasing shortness of breath.  He reported that he had run out of his diuretic about a week ago.  He apparently was discharged on 80 mg of Lasix  daily but ran out about 1 week ago.  He has had increasing edema in the lower extremities increasing shortness of breath and was placed on nonrebreather by EMS and subsequently ended up on BiPAP.  He is DNR/DNI.  He was found to have an abnormally elevated serum digoxin  level 2.4.  His digoxin  was held with treatment.  The patient was started on IV furosemide  60 mg twice daily.  The patient expressed that he wanted to resume hospice care.  Palliative medicine was consulted.  After discussions with the patient and daughter, the patient was transition to full comfort measures.  TOC and social work assisted in transitioning the patient to the next venue hospice care.   Assessment/Plan: Acute on chronic HFpEF -11/07/23 Echo EF 55%. Mild LVH, RV systolic function preserved, normal size LA and RA, with no significant valvular disease.  -Patient not on diuretic therapy at home because he reportedly ran out 1 week ago and not sure he wants to continue further medical care and focus more on hospice treatment.  -He is now on home hospice care and he  confirmed with me on 6/9 that he wants to continue home hospice treatment  -Continue IV furosemide  60 mg IV bid today for symptom management -Initally on midodrine  and fludrocortisone  for blood pressure support>>now transition to comfort measures   Acute hypoxemic and hypercapnic respiratory failure  cardiogenic pulmonary edema.  - resolved after bipap treatment - he presented with 7.33/73/101/38 - Pt remains DNRDNI and pt wants to resume home hospice care  - Aspiration precautions.  - continue supplement oxygen for now   CKD stage 3b, -baseline creatinine 1.3-1.6   Digoxin  toxicity  - presented with elevated serum digoxin  levels - we have stopped giving it to allow for it to wash out - improved - he is having night time bradycardia spells   Hyponatremia - resolved   Chronic back pain/opioid dependence -Continue gabapentin  -PDMP reviewed -morphine  concentrate 02/01/24 -ativan  0.5 mg, 01/26/24  Obesity, class 3 Calculated BMI is 50.8    Cirrhosis of liver  Elevated Transaminases Due to hepatic congestion from CHF No signs of asterixis or ascites.  He is on home hospice care now     Permanent atrial fibrillation  Continue telemetry monitoring  He had been on digoxin  for this because he could not tolerate metoprolol  or other AV nodal blockers He is currently having bradycardic episodes and we will hold digoxin  to allow for it to wash out  Anticoagulation with rivaroxaban .  -now transitioned to comfort measures  Social -palliative medicine following -hopeful to transition to residential hospice if accepted -anticipated life expectancy days  to week        Family Communication:  no Family at bedside  Consultants:  palliative  Code Status:  FULL COMFORT  DVT Prophylaxis:  FULL COMFORT   Procedures: As Listed in Progress Note Above  Antibiotics: None      Subjective: Patient continues to horrible.  Complains of some shortness of breath.  He has some  nausea.  He denies any vomiting.  Denies any chest pain or abdominal pain.  Objective: Vitals:   02/09/24 1841 02/09/24 1846 02/09/24 2035 02/10/24 0433  BP: (!) 132/49 (!) 116/58 (!) 139/53 (!) 122/49  Pulse: 87 63 68 (!) 56  Resp:   19 20  Temp:   98.4 F (36.9 C) 97.9 F (36.6 C)  TempSrc:   Oral   SpO2: 93% 96% 96% 96%  Weight:      Height:        Intake/Output Summary (Last 24 hours) at 02/10/2024 1534 Last data filed at 02/10/2024 1300 Gross per 24 hour  Intake 480 ml  Output 1000 ml  Net -520 ml   Weight change:  Exam:  General:  Pt is alert, follows commands appropriately, not in acute distress HEENT: No icterus, No thrush, No neck mass, [place]/AT Cardiovascular: IRRR, S1/S2, no rubs, no gallops Respiratory: Bilateral rales.  No wheezing Abdomen: Soft/+BS, non tender, non distended, no guarding Extremities: 3 + LEedema, No lymphangitis, No petechiae, No rashes, no synovitis   Data Reviewed: I have personally reviewed following labs and imaging studies Basic Metabolic Panel: Recent Labs  Lab 02/06/24 1902 02/07/24 0410 02/08/24 0259 02/09/24 0441 02/10/24 0430  NA 133* 135 136 138 135  K 4.4 4.1 4.0 4.0 4.1  CL 92* 94* 95* 94* 94*  CO2 32 31 30 34* 32  GLUCOSE 143* 106* 106* 116* 116*  BUN 54* 54* 50* 51* 44*  CREATININE 2.54* 2.27* 1.83* 1.80* 1.66*  CALCIUM  8.0* 8.1* 8.1* 8.2* 8.3*  MG  --   --   --   --  2.7*   Liver Function Tests: Recent Labs  Lab 02/06/24 1902 02/07/24 0410 02/08/24 0259  AST 439* 369* 298*  ALT 403* 388* 362*  ALKPHOS 103 92 85  BILITOT 0.6 0.8 0.9  PROT 6.5 6.2* 5.8*  ALBUMIN  2.9* 2.8* 2.6*   No results for input(s): LIPASE, AMYLASE in the last 168 hours. Recent Labs  Lab 02/08/24 0259  AMMONIA 27   Coagulation Profile: No results for input(s): INR, PROTIME in the last 168 hours. CBC: Recent Labs  Lab 02/06/24 1902 02/07/24 0410 02/08/24 0259 02/10/24 0430  WBC 8.2 7.6 6.0 7.2  NEUTROABS 6.2  --   --    --   HGB 10.8* 11.1* 10.2* 11.2*  HCT 35.0* 36.3* 33.6* 36.5*  MCV 104.5* 102.8* 102.4* 105.8*  PLT 134* 123* 129* 124*   Cardiac Enzymes: No results for input(s): CKTOTAL, CKMB, CKMBINDEX, TROPONINI in the last 168 hours. BNP: Invalid input(s): POCBNP CBG: No results for input(s): GLUCAP in the last 168 hours. HbA1C: No results for input(s): HGBA1C in the last 72 hours. Urine analysis:    Component Value Date/Time   COLORURINE YELLOW 02/07/2024 0718   APPEARANCEUR CLEAR 02/07/2024 0718   APPEARANCEUR Clear 01/18/2018 1539   LABSPEC 1.010 02/07/2024 0718   PHURINE 5.0 02/07/2024 0718   GLUCOSEU NEGATIVE 02/07/2024 0718   HGBUR NEGATIVE 02/07/2024 0718   BILIRUBINUR NEGATIVE 02/07/2024 0718   BILIRUBINUR Negative 01/18/2018 1539   KETONESUR NEGATIVE 02/07/2024 0718   PROTEINUR NEGATIVE  02/07/2024 0718   UROBILINOGEN 0.2 09/06/2012 0213   NITRITE NEGATIVE 02/07/2024 0718   LEUKOCYTESUR NEGATIVE 02/07/2024 0718   Sepsis Labs: @LABRCNTIP (procalcitonin:4,lacticidven:4) )No results found for this or any previous visit (from the past 240 hours).   Scheduled Meds:  furosemide   60 mg Intravenous BID   gabapentin   300 mg Oral BID   oxyCODONE   5 mg Oral TID   pantoprazole   40 mg Oral Daily   sodium chloride  flush  3-10 mL Intravenous Q12H   Continuous Infusions:  Procedures/Studies: DG Chest Port 1 View Result Date: 02/09/2024 CLINICAL DATA:  Chest pain EXAM: PORTABLE CHEST 1 VIEW COMPARISON:  02/06/2024 FINDINGS: Cardiac shadow is enlarged but stable. Lungs are well aerated bilaterally. Increasing opacity along the minor fissure is noted likely representing atelectasis. Trapped fluid could not be totally excluded. Small effusions are seen bilaterally. 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 Patchy areas of opacity are noted in the left lung likely related to scarring. IMPRESSION: New increase in opacity along the right minor fissure which may be related  atelectasis. Chronic changes on the left are seen. Electronically Signed   By: Violeta Grey M.D.   On: 02/09/2024 19:09   DG Chest Port 1 View Result Date: 02/06/2024 CLINICAL DATA:  Short of breath EXAM: PORTABLE CHEST 1 VIEW COMPARISON:  12/21/2023 FINDINGS: Single frontal view of the chest demonstrates an enlarged cardiac silhouette. Lung volumes are diminished, with pulmonary vascular congestion and patchy bilateral airspace disease consistent with edema. Chronic left basilar scarring and pleural thickening. No pneumothorax. No acute bony abnormalities. IMPRESSION: 1. Constellation of findings most consistent with mild congestive heart failure. Electronically Signed   By: Bobbye Burrow M.D.   On: 02/06/2024 19:33    Demaris Fillers, DO  [facility]  If 7PM-7AM, please contact night-coverage [email or web address] Password [facility] 02/10/2024, 3:34 PM   LOS: 4 days

## 2024-02-11 ENCOUNTER — Inpatient Hospital Stay (HOSPITAL_COMMUNITY)
Admission: AD | Admit: 2024-02-11 | Discharge: 2024-02-16 | DRG: 951 | Disposition: A | Discharge status: Hospice | Source: Hospice | Attending: Internal Medicine | Admitting: Internal Medicine

## 2024-02-11 DIAGNOSIS — I5084 End stage heart failure: Secondary | ICD-10-CM | POA: Diagnosis present

## 2024-02-11 DIAGNOSIS — I130 Hypertensive heart and chronic kidney disease with heart failure and stage 1 through stage 4 chronic kidney disease, or unspecified chronic kidney disease: Secondary | ICD-10-CM | POA: Diagnosis present

## 2024-02-11 DIAGNOSIS — Z79899 Other long term (current) drug therapy: Secondary | ICD-10-CM

## 2024-02-11 DIAGNOSIS — Z66 Do not resuscitate: Secondary | ICD-10-CM | POA: Diagnosis present

## 2024-02-11 DIAGNOSIS — Z6841 Body Mass Index (BMI) 40.0 and over, adult: Secondary | ICD-10-CM | POA: Diagnosis not present

## 2024-02-11 DIAGNOSIS — G9341 Metabolic encephalopathy: Secondary | ICD-10-CM | POA: Diagnosis present

## 2024-02-11 DIAGNOSIS — Z7189 Other specified counseling: Secondary | ICD-10-CM

## 2024-02-11 DIAGNOSIS — Z515 Encounter for palliative care: Secondary | ICD-10-CM | POA: Diagnosis not present

## 2024-02-11 DIAGNOSIS — G8929 Other chronic pain: Secondary | ICD-10-CM | POA: Diagnosis present

## 2024-02-11 DIAGNOSIS — J9602 Acute respiratory failure with hypercapnia: Secondary | ICD-10-CM | POA: Diagnosis present

## 2024-02-11 DIAGNOSIS — J9621 Acute and chronic respiratory failure with hypoxia: Secondary | ICD-10-CM

## 2024-02-11 DIAGNOSIS — M549 Dorsalgia, unspecified: Secondary | ICD-10-CM | POA: Diagnosis present

## 2024-02-11 DIAGNOSIS — Z96643 Presence of artificial hip joint, bilateral: Secondary | ICD-10-CM | POA: Diagnosis present

## 2024-02-11 DIAGNOSIS — Z9884 Bariatric surgery status: Secondary | ICD-10-CM

## 2024-02-11 DIAGNOSIS — Z91048 Other nonmedicinal substance allergy status: Secondary | ICD-10-CM

## 2024-02-11 DIAGNOSIS — I9589 Other hypotension: Secondary | ICD-10-CM | POA: Diagnosis present

## 2024-02-11 DIAGNOSIS — E66813 Obesity, class 3: Secondary | ICD-10-CM | POA: Diagnosis present

## 2024-02-11 DIAGNOSIS — I4821 Permanent atrial fibrillation: Secondary | ICD-10-CM | POA: Diagnosis present

## 2024-02-11 DIAGNOSIS — K761 Chronic passive congestion of liver: Secondary | ICD-10-CM | POA: Diagnosis present

## 2024-02-11 DIAGNOSIS — I5033 Acute on chronic diastolic (congestive) heart failure: Secondary | ICD-10-CM

## 2024-02-11 DIAGNOSIS — N1831 Chronic kidney disease, stage 3a: Secondary | ICD-10-CM

## 2024-02-11 DIAGNOSIS — Z87891 Personal history of nicotine dependence: Secondary | ICD-10-CM | POA: Diagnosis not present

## 2024-02-11 DIAGNOSIS — M170 Bilateral primary osteoarthritis of knee: Secondary | ICD-10-CM | POA: Diagnosis present

## 2024-02-11 DIAGNOSIS — Z9049 Acquired absence of other specified parts of digestive tract: Secondary | ICD-10-CM

## 2024-02-11 DIAGNOSIS — J9601 Acute respiratory failure with hypoxia: Secondary | ICD-10-CM | POA: Diagnosis present

## 2024-02-11 DIAGNOSIS — Z91030 Bee allergy status: Secondary | ICD-10-CM

## 2024-02-11 DIAGNOSIS — Z79891 Long term (current) use of opiate analgesic: Secondary | ICD-10-CM

## 2024-02-11 DIAGNOSIS — F1120 Opioid dependence, uncomplicated: Secondary | ICD-10-CM | POA: Diagnosis present

## 2024-02-11 DIAGNOSIS — I509 Heart failure, unspecified: Secondary | ICD-10-CM

## 2024-02-11 DIAGNOSIS — N1832 Chronic kidney disease, stage 3b: Secondary | ICD-10-CM | POA: Diagnosis present

## 2024-02-11 DIAGNOSIS — E871 Hypo-osmolality and hyponatremia: Secondary | ICD-10-CM | POA: Diagnosis present

## 2024-02-11 DIAGNOSIS — R001 Bradycardia, unspecified: Secondary | ICD-10-CM | POA: Diagnosis not present

## 2024-02-11 DIAGNOSIS — T460X5A Adverse effect of cardiac-stimulant glycosides and drugs of similar action, initial encounter: Secondary | ICD-10-CM | POA: Diagnosis present

## 2024-02-11 DIAGNOSIS — Z9981 Dependence on supplemental oxygen: Secondary | ICD-10-CM

## 2024-02-11 MED ORDER — BIOTENE DRY MOUTH MT LIQD: 15.0000 mL | OROMUCOSAL | Status: DC | PRN

## 2024-02-11 MED ORDER — ONDANSETRON HCL 4 MG/2ML IJ SOLN
4.0000 mg | Freq: Four times a day (QID) | INTRAMUSCULAR | Status: AC | PRN
Start: 2024-02-11 — End: ?

## 2024-02-11 MED ORDER — HALOPERIDOL 0.5 MG PO TABS: 0.5000 mg | ORAL_TABLET | ORAL | Status: DC | PRN

## 2024-02-11 MED ORDER — HALOPERIDOL LACTATE 2 MG/ML PO CONC: 0.5000 mg | ORAL | Status: DC | PRN

## 2024-02-11 MED ORDER — FUROSEMIDE 40 MG PO TABS
60.0000 mg | ORAL_TABLET | Freq: Two times a day (BID) | ORAL | Status: DC
  Administered 2024-02-11 – 2024-02-15 (×4): 60 mg via ORAL
  Filled 2024-02-11 (×8): qty 1

## 2024-02-11 MED ORDER — HYDROCODONE-ACETAMINOPHEN 10-325 MG PO TABS: 1.0000 | ORAL_TABLET | Freq: Four times a day (QID) | ORAL | Status: DC | PRN

## 2024-02-11 MED ORDER — HALOPERIDOL LACTATE 5 MG/ML IJ SOLN
0.5000 mg | INTRAMUSCULAR | Status: DC | PRN
  Administered 2024-02-13: 0.5 mg via INTRAVENOUS
  Filled 2024-02-11: qty 1

## 2024-02-11 MED ORDER — ONDANSETRON 4 MG PO TBDP: 4.0000 mg | ORAL_TABLET | Freq: Four times a day (QID) | ORAL | Status: DC | PRN

## 2024-02-11 MED ORDER — FUROSEMIDE 10 MG/ML IJ SOLN: 60.0000 mg | Freq: Two times a day (BID) | INTRAMUSCULAR | Status: DC

## 2024-02-11 MED ORDER — ACETAMINOPHEN 325 MG PO TABS
650.0000 mg | ORAL_TABLET | Freq: Four times a day (QID) | ORAL | Status: AC | PRN
Start: 2024-02-11 — End: ?

## 2024-02-11 MED ORDER — LORAZEPAM 0.5 MG PO TABS: 0.5000 mg | ORAL_TABLET | Freq: Four times a day (QID) | ORAL | Status: DC | PRN

## 2024-02-11 MED ORDER — GLYCOPYRROLATE 0.2 MG/ML IJ SOLN
0.2000 mg | INTRAMUSCULAR | Status: DC | PRN
  Filled 2024-02-11: qty 1

## 2024-02-11 MED ORDER — ACETAMINOPHEN 650 MG RE SUPP: 650.0000 mg | Freq: Four times a day (QID) | RECTAL | Status: DC | PRN

## 2024-02-11 MED ORDER — HYDROMORPHONE HCL 1 MG/ML IJ SOLN
0.5000 mg | INTRAMUSCULAR | Status: DC | PRN
  Administered 2024-02-12 – 2024-02-16 (×15): 1 mg via INTRAVENOUS
  Filled 2024-02-11 (×15): qty 1

## 2024-02-11 MED ORDER — MORPHINE SULFATE (PF) 2 MG/ML IV SOLN: 2.0000 mg | INTRAVENOUS | Status: DC | PRN

## 2024-02-11 MED ORDER — POLYVINYL ALCOHOL 1.4 % OP SOLN
1.0000 [drp] | Freq: Four times a day (QID) | OPHTHALMIC | Status: DC | PRN
  Administered 2024-02-16: 1 [drp] via OPHTHALMIC
  Filled 2024-02-11: qty 15

## 2024-02-11 MED ORDER — GLYCOPYRROLATE 0.2 MG/ML IJ SOLN
0.2000 mg | INTRAMUSCULAR | Status: DC | PRN
  Administered 2024-02-16: 0.2 mg via SUBCUTANEOUS
  Filled 2024-02-11: qty 1

## 2024-02-11 MED ORDER — OXYCODONE HCL 20 MG/ML PO CONC
5.0000 mg | Freq: Three times a day (TID) | ORAL | Status: DC
  Administered 2024-02-11 – 2024-02-16 (×13): 5 mg via ORAL
  Filled 2024-02-11 (×15): qty 0.5

## 2024-02-11 MED ORDER — OXYCODONE HCL 20 MG/ML PO CONC: 5.0000 mg | Freq: Three times a day (TID) | ORAL | Status: DC

## 2024-02-11 MED ORDER — GLYCOPYRROLATE 1 MG PO TABS: 1.0000 mg | ORAL_TABLET | ORAL | Status: DC | PRN

## 2024-02-11 NOTE — Progress Notes (Signed)
 Daily Progress Note   Patient Name: [name]       Date: 02/11/2024 DOB: [birth year]  Age: 78 y.o. MRN#: [id number] Attending Physician: Demaris Fillers, MD Primary Care Physician: Medicine, [facility] (Inactive) Admit Date: 02/11/2024  Reason for Consultation/Follow-up: Establishing goals of care and Symptom management  Subjective:  78 y.o. male  with past medical history of HFpEF, morbid obesity, hepatic cirrhosis, GERD, PAF on rivaroxaban , OSA on CPAP, arthritis, frequent hospitalizations. admitted on 02/06/2024 with increased shortness of breath and lower extremity edema.    Diagnosed with acute on chronic HFpEF with CO2 narcosis in the setting of not being on diuretic.  Initially required BiPAP but now weaned back down to supplemental O2 via nasal cannula.  Also noted to have abnormally elevated serum digoxin  level.  He is a hospice patient with recent enrollment.  Currently on IV diuretic.  Renal function relatively stable despite high-dose IV diuretics (last assessed 02/10/2024).  Troponin levels collected 02/09/2024 x 2 slightly elevated but downtrending.    Today, patient appears more confused than at previous encounters.  He denies any pain, shortness of breath, nausea, chest pain.  He states that he just feels out of it.  He is also frustrated because he continues to struggle with loss of control in his hands.  He does request set up for his tray so that he can eat.   Length of Stay: 0   Physical Exam Constitutional:      Appearance: He is ill-appearing.  Pulmonary:     Effort: Pulmonary effort is normal. No respiratory distress.   Neurological:     Mental Status: He is alert. He is disoriented.     Comments: Appears acutely confused compared to yesterday, continues to struggle with hand weakness and spasms, had  a very hard time drinking/eating            Vital Signs: There were no vitals taken for this visit. SpO2:   O2 Device:   O2 Flow Rate:        Palliative Assessment/Data:40%   Palliative Care Assessment & Plan   Patient Profile/assessment:  78 year old male with numerous chronic medical problems being admitted for acute on chronic heart failure exacerbation. Required BiPAP and IV diuresis. Now on O2 via nasal cannula with BiPAP at night. He has had recurrent hospitalizations and there is very little else from a medical perspective we can do to optimize his heart failure and prevent further exacerbations. Felt to be at end-stage heart failure. He was enrolled on hospice for end-of-life care for CHF prior to this hospitalization.  Initially, the  plan was to stabilize volume status enough that he would be able to return home with hospice support.  However, patient expressed desire on 02/10/2024 to transition to comfort care.  Full comfort care initiated.  Both patient and family are concerned about his ability to remain return home.  Therefore, TOC reached out to the hospice managing patient and plan is to transition to [facility] hospice and remain inpatient at current location as no beds are available at residential hospice location.  Plan is for him to remain here until bed opens up.   Recommendations/Plan: Continue DNR/DNI/full comfort measures Adjust symptom medicines to determine if these are contributing to his confusion GIP through hospice to remain inpatient at [facility] until beds available. Collaborate with hospice team to ensure end-of-life care is optimized We will transition from IV Lasix  to oral Lasix  see below PMT to continue to follow  Symptom management: Continue Tylenol  as needed Continue Robinul  as needed Continue Haldol  as needed Continue Norco as needed Continue lorazepam  as needed Continue Zofran  as needed Continue oxycodone  5 mg 3 times daily Discontinue morphine   IV Initiate Dilaudid  0.5-1 mg IV as needed  Discontinue Lasix  IV  Initiate Lasix  60 mg p.o. twice daily   Prognosis: Likely weeks  Discharge Planning: Recommend GIP Admission (Hospice in Place)  Care plan was discussed with admitting provider, nursing staff, TOC team,and Floretta Huron. Called daughter Cain Castillo, no answer, left VM. Extensively reviewed epic notes, prior labs, flowsheets, and vital signs.    Billing based on MDM: High  Problems Addressed: One acute or chronic illness or injury that poses a threat to life or bodily function   Risks: Parenteral controlled substances EOL care         Render Carrie, NP  Palliative Medicine Team Team phone # [phone]  Thank you for allowing the Palliative Medicine Team to assist in the care of this patient. Please utilize secure chat with additional questions, if there is no response within 30 minutes please call the above phone number.  Palliative Medicine Team providers are available by phone from 7am to 7pm daily and can be reached through the team cell phone.  Should this patient require assistance outside of these hours, please call the patient's attending physician.

## 2024-02-11 NOTE — Discharge Summary (Signed)
 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  Recently discharged from [facility] after a prolonged stay from 5/5 - 01/11/2024.  He is currently on home hospice care.  He recently contacted his cardiologist and requested not to be scheduled for further visits as he has transition to hospice care at home.  He has chronic hypotension on midodrine .  He presented to the emergency department 02/06/2024 complaining of increasing shortness of breath.  He reported that he had run out of his diuretic about a week ago.  He apparently was discharged on 80 mg of Lasix  daily but ran out about 1 week ago.  He has had increasing edema in the lower extremities increasing shortness of breath and was placed on nonrebreather by EMS and subsequently ended up on BiPAP.  He is DNR/DNI.  He was found to have an abnormally elevated serum digoxin  level 2.4.  His digoxin  was held with treatment.  The patient was started on IV furosemide  60 mg twice daily.  The patient expressed that he wanted to resume hospice care.  Palliative medicine was consulted.  After discussions with the patient and daughter, the patient was transition to full comfort measures.  TOC and social work assisted in transitioning the patient to the next venue hospice care.  Assessment and Plan: * Acute on chronic diastolic CHF (congestive heart failure) (HCC) Echocardiogram with preserved LV systolic function with EF 55%. Mild LVH, RV systolic function preserved, normal size LA and RA, with no significant valvular  disease.   Patient not on diuretic therapy at home Plan to start patient on IV furosemide  60 mg IV bid Continue midodrine  and fludrocortisone  for blood pressure support.   Acute hypoxemic and hypercapnic respiratory failure due to acute cardiogenic pulmonary edema.  Complicated with acute metabolic encephalopathy.   Plan to continue diuresis Non invasive mechanical ventilation with Bipap 14/6 with Fi02 30% tidal volumes generated around 600 ml. Aspiration precautions.   Paroxysmal atrial fibrillation (HCC) Continue telemetry monitoring  Continue digoxin   Anticoagulation with rivaroxaban .   Hypertension Continue blood pressure monitoring.  Continue midodrine  and fludrocortisone    CKD stage 3a, GFR 45-59 ml/min (HCC) Hyponatremia.   Continue diuresis with furosemide   Follow up renal function and electrolytes in am Avoid  hypotension and nephrotoxic medications   Cirrhosis of liver (HCC) Patient with encephalopathy, likely due to hypercapnia.  No signs of asterixis or ascites.  Trend LFT.   Obesity, class 3 Calculated BMI is 50.8   Acute on chronic HFpEF -11/07/23 Echo EF 55%. Mild LVH, RV systolic function preserved, normal size LA and RA, with no significant valvular disease.  -Patient not on diuretic therapy at home because he reportedly ran out 1 week ago and not sure he wants to continue further medical care and focus more on hospice treatment.  -He is now on home hospice care and he confirmed with me on 6/9 that he wants to continue home hospice treatment  -Continue IV furosemide  60 mg IV bid today for symptom management -Initally on midodrine  and fludrocortisone  for blood pressure support>>now transition to comfort measures   Acute  hypoxemic and hypercapnic respiratory failure  cardiogenic pulmonary edema.  - resolved after bipap treatment - he presented with 7.33/73/101/38 - Pt remains DNRDNI and pt wants to resume home hospice care  - Aspiration precautions.  -  continue supplement oxygen for now   CKD stage 3b, -baseline creatinine 1.3-1.6   Digoxin  toxicity  - presented with elevated serum digoxin  levels - we have stopped giving it to allow for it to wash out - improved - he is having night time bradycardia spells   Hyponatremia - resolved    Chronic back pain/opioid dependence -Continue gabapentin  -PDMP reviewed -morphine  concentrate 02/01/24 -ativan  0.5 mg, 01/26/24   Obesity, class 3 Calculated BMI is 50.8    Cirrhosis of liver  Elevated Transaminases Due to hepatic congestion from CHF No signs of asterixis or ascites.  He is on home hospice care now     Permanent atrial fibrillation  Continue telemetry monitoring  He had been on digoxin  for this because he could not tolerate metoprolol  or other AV nodal blockers He is currently having bradycardic episodes and we will hold digoxin  to allow for it to wash out  Anticoagulation with rivaroxaban .  -now transitioned to comfort measures   Social -palliative medicine following -[facility] consulted -anticipated life expectancy days to a week -[facility] accepted patient for hospice care for end of life care -due to lack of bed availability, patient was transitioned to GIP status in the hospital           Consultants: palliative Procedures performed: none  Disposition: Hospice care Diet recommendation:  Comfort feeds DISCHARGE MEDICATION: Allergies as of 02/11/2024       Reactions   Bee Venom Anaphylaxis, Other (See Comments)   Noted from [place]. honey bee venom   Nickel Rash, Other (See Comments)   Breaks me out        Medication List     STOP taking these medications    busPIRone  5 MG tablet Commonly known as: BUSPAR    CENTRUM ADULTS PO   cyclobenzaprine  10 MG tablet Commonly known as: FLEXERIL    digoxin  0.25 MG tablet Commonly known as: LANOXIN    fludrocortisone  0.1 MG tablet Commonly known as: FLORINEF    furosemide  80 MG tablet Commonly  known as: LASIX  Replaced by: furosemide  10 MG/ML injection   gabapentin  300 MG capsule Commonly known as: NEURONTIN    levalbuterol  45 MCG/ACT inhaler Commonly known as: XOPENEX  HFA   midodrine  10 MG tablet Commonly known as: PROAMATINE    morphine  CONCENTRATE 10 mg / 0.5 ml concentrated solution Replaced by: morphine  (PF) 2 MG/ML injection   pantoprazole  40 MG tablet Commonly known as: PROTONIX    Xarelto  20 MG Tabs tablet Generic drug: rivaroxaban        TAKE these medications    acetaminophen  500 MG tablet Commonly known as: TYLENOL  Take 1,000 mg by mouth as needed for mild pain (pain score 1-3) or headache.   furosemide  10 MG/ML injection Commonly known as: LASIX  Inject 6 mLs (60 mg total) into the vein 2 (two) times daily. Replaces: furosemide  80 MG tablet   HYDROcodone -acetaminophen  10-325 MG tablet Commonly known as: NORCO Take 1 tablet by mouth every 6 (six) hours as needed for moderate pain (pain score 4-6).   LORazepam  0.5 MG tablet Commonly known as: ATIVAN  Take 0.5 mg by mouth every 6 (six) hours as needed.   melatonin 3 MG Tabs tablet Take 2 tablets (6 mg total) by mouth at bedtime.   morphine  (PF) 2 MG/ML injection Inject 1  mL (2 mg total) into the vein every 4 (four) hours as needed (chest pain). Replaces: morphine  CONCENTRATE 10 mg / 0.5 ml concentrated solution   oxyCODONE  20 MG/ML concentrated solution Commonly known as: ROXICODONE  INTENSOL Take 0.3 mLs (6 mg total) by mouth 3 (three) times daily.   OXYGEN Inhale 2 L into the lungs continuous.        Follow-up Information     [facility] Follow up.   Specialty: Hospice Why: Hospice will follow up after you are DC to continue services. Contact information: [address] [place]  [address] [phone]               Discharge Exam: Cleavon Curls Weights   02/06/24 1909 02/07/24 2241  Weight: (!) 151.8 kg (!) 164.6 kg   HEENT:  [place]/AT, No thrush, no  icterus CV:  RRR, no rub, no S3, no S4 Lung:  bilateral crackles.  No wheeze Abd:  soft/+BS, NT Ext:  2 + LE edema, no lymphangitis, no synovitis, no rash   Condition at discharge: stable  The results of significant diagnostics from this hospitalization (including imaging, microbiology, ancillary and laboratory) are listed below for reference.   Imaging Studies: DG Chest Port 1 View Result Date: 02/09/2024 CLINICAL DATA:  Chest pain EXAM: PORTABLE CHEST 1 VIEW COMPARISON:  02/06/2024 FINDINGS: Cardiac shadow is enlarged but stable. Lungs are well aerated bilaterally. Increasing opacity along the minor fissure is noted likely representing atelectasis. Trapped fluid could not be totally excluded. Small effusions are seen bilaterally. 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 Patchy areas of opacity are noted in the left lung likely related to scarring. IMPRESSION: New increase in opacity along the right minor fissure which may be related atelectasis. Chronic changes on the left are seen. Electronically Signed   By: Violeta Grey M.D.   On: 02/09/2024 19:09   DG Chest Port 1 View Result Date: 02/06/2024 CLINICAL DATA:  Short of breath EXAM: PORTABLE CHEST 1 VIEW COMPARISON:  12/21/2023 FINDINGS: Single frontal view of the chest demonstrates an enlarged cardiac silhouette. Lung volumes are diminished, with pulmonary vascular congestion and patchy bilateral airspace disease consistent with edema. Chronic left basilar scarring and pleural thickening. No pneumothorax. No acute bony abnormalities. IMPRESSION: 1. Constellation of findings most consistent with mild congestive heart failure. Electronically Signed   By: Bobbye Burrow M.D.   On: 02/06/2024 19:33    Microbiology: Results for orders placed or performed during the hospital encounter of 12/21/23  MRSA Next Gen by PCR, Nasal     Status: Abnormal   Collection Time: 12/22/23  3:24 PM   Specimen: Nasal Mucosa; Nasal Swab  Result Value Ref Range  Status   MRSA by PCR Next Gen DETECTED (A) NOT DETECTED Final    Comment: RESULT CALLED TO, READ BACK BY AND VERIFIED WITH: RN Lafonda Piety [id number] @[id number]  FH (NOTE) The GeneXpert MRSA Assay (FDA approved for NASAL specimens only), is one component of a comprehensive MRSA colonization surveillance program. It is not intended to diagnose MRSA infection nor to guide or monitor treatment for MRSA infections. Test performance is not FDA approved in patients less than 23 years old. Performed at [facility], [address], [place], [place] [address]     Labs: CBC: Recent Labs  Lab 02/06/24 1902 02/07/24 0410 02/08/24 0259 02/10/24 0430  WBC 8.2 7.6 6.0 7.2  NEUTROABS 6.2  --   --   --   HGB 10.8* 11.1* 10.2*  11.2*  HCT 35.0* 36.3* 33.6* 36.5*  MCV 104.5* 102.8* 102.4* 105.8*  PLT 134* 123* 129* 124*   Basic Metabolic Panel: Recent Labs  Lab 02/06/24 1902 02/07/24 0410 02/08/24 0259 02/09/24 0441 02/10/24 0430  NA 133* 135 136 138 135  K 4.4 4.1 4.0 4.0 4.1  CL 92* 94* 95* 94* 94*  CO2 32 31 30 34* 32  GLUCOSE 143* 106* 106* 116* 116*  BUN 54* 54* 50* 51* 44*  CREATININE 2.54* 2.27* 1.83* 1.80* 1.66*  CALCIUM  8.0* 8.1* 8.1* 8.2* 8.3*  MG  --   --   --   --  2.7*   Liver Function Tests: Recent Labs  Lab 02/06/24 1902 02/07/24 0410 02/08/24 0259  AST 439* 369* 298*  ALT 403* 388* 362*  ALKPHOS 103 92 85  BILITOT 0.6 0.8 0.9  PROT 6.5 6.2* 5.8*  ALBUMIN  2.9* 2.8* 2.6*   CBG: No results for input(s): GLUCAP in the last 168 hours.  Discharge time spent: greater than 30 minutes.  Signed: Demaris Fillers, MD [facility] 02/11/2024

## 2024-02-11 NOTE — TOC Initial Note (Signed)
 Transition of Care [facility]) - Initial/Assessment Note    Patient Details  Name: [name] MRN: [id number] Date of Birth: 02/22/1946  [facility] CM/SW Contact:    Cyndie Dredge, LCSWA Phone Number: 02/11/2024, 2:09 PM  Clinical Narrative:                   Patient chart was flipped to GIP since [facility] does not have any open beds at the moment.  Patient was already active at home with hospice , but does not have 24/7 care.Family is aware and on board with virtual hospice hospital bed. TOC to follow.   Expected Discharge Plan: Hospice Medical Facility Barriers to Discharge: Hospice Bed not available   Patient Goals and CMS Choice Patient states their goals for this hospitalization and ongoing recovery are:: DC to [facility] once a bed is open CMS Medicare.gov Compare Post Acute Care list provided to:: Patient Choice offered to / list presented to : Patient      Expected Discharge Plan and Services In-house Referral: Clinical Social Work Discharge Planning Services: CM Consult Post Acute Care Choice: Durable Medical Equipment, Hospice Living arrangements for the past 2 months: Single Family Home                 DME Arranged: N/A           HH Agency: [facility]        Prior Living Arrangements/Services Living arrangements for the past 2 months: Single Family Home Lives with:: Self Patient language and need for interpreter reviewed:: Yes Do you feel safe going back to the place where you live?: No   Does not have 24/7 help  Need for Family Participation in Patient Care: Yes (Comment) Care giver support system in place?: No (comment) Current home services: DME, Hospice Criminal Activity/Legal Involvement Pertinent to Current Situation/Hospitalization: No - Comment as needed  Activities of Daily Living      Permission Sought/Granted      Share Information with NAME: Darvin     Permission granted to share info w Relationship:  Patient     Emotional Assessment Appearance:: Appears stated age   Affect (typically observed): Appropriate Orientation: : Oriented to Self, Oriented to Place, Oriented to Situation Alcohol  / Substance Use: Not Applicable Psych Involvement: No (comment)  Admission diagnosis:  End of life care [Z51.5] Patient Active Problem List   Diagnosis Date Noted   End of life care 02/11/2024   Acute and chronic respiratory failure with hypoxia (HCC) 02/07/2024   Liver failure (HCC) 02/06/2024   Hepatic encephalopathy (HCC) 02/06/2024   Cirrhosis of liver (HCC) 02/06/2024   Obesity, class 3 02/06/2024   CKD stage 3a, GFR 45-59 ml/min (HCC) 02/06/2024   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   Chronic respiratory failure with hypoxia (HCC) 12/21/2023   Elevated serum immunoglobulin free light chains 12/08/2023   Generalized weakness 11/06/2023   Lactic acidosis 11/06/2023   Uncomplicated opioid dependence (HCC) 11/06/2023   Orthostatic hypotension 10/31/2023   COVID-19 virus infection 09/26/2020   Near syncope 09/25/2020   Macrocytosis 09/25/2020   Paroxysmal atrial fibrillation (HCC) 09/25/2020   Goals of care, counseling/discussion    Palliative care encounter    Hyperkalemia 10/01/2018   Other cirrhosis of liver (HCC) 10/01/2018   Chronic back pain 10/01/2018   Cellulitis of left hip    Recurrent 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 09/04/2018   Physical debility 09/04/2018   Pleural effusion, bilateral  Hypertensive urgency 08/17/2018   Pleural effusion on left 08/17/2018   Acute on chronic diastolic CHF (congestive heart failure) (HCC) 08/17/2018   OSA on CPAP 08/17/2018   Chronic atrial fibrillation (HCC)    Delirium    Depression    OSA (obstructive sleep apnea)    Obesity, Class III, BMI 40-49.9 (morbid obesity)    Atrial fibrillation with RVR (HCC) 04/16/2018   Morbid obesity with BMI of 50.0-59.9, adult (HCC) 04/16/2018   AKI (acute kidney injury) (HCC)  04/16/2018   Visual hallucination 04/16/2018   SIRS (systemic inflammatory response syndrome) (HCC) 04/16/2018   Anemia of chronic disease 04/16/2018   Left hip postoperative wound infection 03/15/2018   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   Femur fracture, left (HCC) 02/15/2018   Macrocytic anemia 02/15/2018   Femur fracture (HCC) 02/15/2018   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   Spondylosis of cervical region without myelopathy or radiculopathy 04/30/2017   Gastroesophageal reflux disease 02/10/2017   SOB (shortness of breath) 03/31/2014   Hypertension    Hyponatremia 03/18/2012   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HCC) 03/17/2012   PCP:  Medicine, [facility] (Inactive) Pharmacy:   [facility] [place], [place] - [address] [place] [address] Phone: [phone] Fax: [phone]     Social Drivers of Health (SDOH) Social History: SDOH Screenings   Food Insecurity: No Food Insecurity (02/07/2024)  Housing: Low Risk  (02/07/2024)  Transportation Needs: No Transportation Needs (02/07/2024)  Utilities: Not At Risk (02/07/2024)  Financial Resource Strain: Low Risk  (09/15/2023)   Received from [facility]  Physical Activity: Sufficiently Active (01/11/2023)   [facility]  Social Connections: Moderately Isolated (02/07/2024)  Stress: No Stress Concern Present (10/08/2023)   [facility] from [facility]  Tobacco Use: Medium Risk (02/09/2024)   SDOH Interventions:     Readmission Risk Interventions    02/11/2024    2:07 PM 02/11/2024    9:58 AM 02/10/2024   10:04 AM  Readmission Risk Prevention Plan  Transportation Screening Complete Complete Complete  Medication Review Oceanographer) Complete Complete Complete  HRI or Home Care Consult Complete Complete Complete  SW Recovery Care/Counseling Consult Complete Complete Complete  Palliative Care Screening  Complete Complete Complete  Skilled Nursing Facility Not Applicable Not Applicable Not Applicable

## 2024-02-11 NOTE — H&P (Addendum)
 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 Recently discharged from [facility] after a prolonged stay from 5/5 - 01/11/2024. He is currently on home hospice care. He recently contacted his cardiologist and requested not to be scheduled for further visits as he has transition to hospice care at home. He has chronic hypotension on midodrine . He presented to the emergency department 02/06/2024 complaining of increasing shortness of breath. He reported that he had run out of his diuretic about a week ago. He apparently was discharged on 80 mg of Lasix  daily but ran out about 1 week ago. He has had increasing edema in the lower extremities increasing shortness of breath and was placed on nonrebreather by EMS and subsequently ended up on BiPAP. He is DNR/DNI. He was found to have an abnormally elevated serum digoxin  level 2.4. His digoxin  was held with treatment. The patient was started on IV furosemide  60 mg twice daily.  He remained fluid overloaded and sob. The patient expressed that he wanted to resume hospice care. Palliative medicine was consulted. After discussions with the patient and daughter, the patient was transition to full comfort measures. TOC and social work assisted in transitioning the patient to the next venue hospice care.  -anticipated life expectancy days to a week -[facility] accepted patient for hospice care for end of life care -due to lack of bed availability, patient was transitioned to GIP status in the hospital  Review of  Systems: As mentioned in the history of present illness. All other systems reviewed and are negative. 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   Past Surgical History:  Procedure Laterality Date   APPENDECTOMY     ARTERY BIOPSY Left 12/30/2017   Procedure: BIOPSY TEMPORAL ARTERY;  Surgeon: Boyce Byes, MD;  Location: [facility];  Service: General;  Laterality: Left;   CHOLECYSTECTOMY     COLONOSCOPY W/ POLYPECTOMY     ESOPHAGOGASTRODUODENOSCOPY (EGD) WITH PROPOFOL   07/06/2012   Procedure: ESOPHAGOGASTRODUODENOSCOPY (EGD) WITH PROPOFOL ;  Surgeon: Mathew Solomon, MD;  Location: [facility];  Service: Endoscopy;  Laterality: N/A;   ESOPHAGOSCOPY     EYE SURGERY     left eye- muscle repair   HERNIA REPAIR     ventral hernia   INCISION AND DRAINAGE HIP Left  03/17/2018   Procedure: IRRIGATION AND DEBRIDEMENT LEFT THIGH WOUND;  Surgeon: Liliane Rei, MD;  Location: [facility];  Service: Orthopedics;  Laterality: Left;   JOINT REPLACEMENT     bilateral hips.  Right broke and had to be re placed again   MASS EXCISION N/A 03/02/2015   Procedure: EXCISION ABDOMINAL WALL MASS;  Surgeon: Juanita Norlander, MD;  Location: [facility];  Service: General;  Laterality: N/A;   TOTAL HIP REVISION Left 02/17/2018    Procedure: Left total hip arthroplasty revision;  Surgeon: Liliane Rei, MD;  Location: [facility];  Service: Orthopedics;  Laterality: Left;   VERTICAL BANDED GASTROPLASTY     Social History:  reports that he quit smoking about 39 years ago. His smoking use included cigarettes. He started smoking about 57 years ago. He has a 35.6 pack-year smoking history. He has never used smokeless tobacco. He reports that he does not currently use alcohol . He reports that he does not use drugs.  Allergies  Allergen Reactions   Bee Venom Anaphylaxis and Other (See Comments)    Noted from [place]. honey bee venom   Nickel Rash and Other (See Comments)    Breaks me out    Family History  Problem Relation Age of Onset   Cancer Mother    Alcohol  abuse Father     Prior to Admission medications   Medication Sig Start Date End Date Taking? Authorizing Provider  acetaminophen  (TYLENOL ) 500 MG tablet Take 1,000 mg by mouth as needed for mild pain (pain score 1-3) or headache.    [provider]  furosemide  (LASIX ) 10 MG/ML injection Inject 6 mLs (60 mg total) into the vein 2 (two) times daily. 02/11/24   Demaris Fillers, MD  HYDROcodone -acetaminophen  (NORCO) 10-325 MG tablet Take 1 tablet by mouth every 6 (six) hours as needed for moderate pain (pain score 4-6). 12/26/23   [place], Rema Care, DO  LORazepam  (ATIVAN ) 0.5 MG tablet Take 0.5 mg by mouth every 6 (six) hours as needed. 02/03/24   [provider]  Melatonin 3 MG TABS Take 2 tablets (6 mg total) by mouth at bedtime. 09/11/18   Krishnan, Gokul, MD  Morphine  Sulfate (MORPHINE , PF,) 2 MG/ML injection Inject 1 mL (2 mg total) into the vein every 4 (four) hours as needed (chest pain). 02/11/24   Demaris Fillers, MD  oxyCODONE  (ROXICODONE  INTENSOL) 20 MG/ML concentrated solution Take 0.3 mLs (6 mg total) by mouth 3 (three) times daily. 02/11/24   Demaris Fillers, MD  OXYGEN Inhale 2 L into the lungs continuous.    [provider]    Physical  Exam: GENERAL:  A&O x 2, NAD, well developed, cooperative, follows commands HEENT: [place]/AT, No thrush, No icterus, No oral ulcers Neck:  No neck mass, No meningismus, soft, supple CV: RRR, no S3, no S4, no rub, no JVD Lungs:  bilateral crackles.  No wheeze Abd: soft/NT +BS, nondistended Ext: 2 + LE edema, no lymphangitis, no cyanosis, no rashes Neuro:  CN II-XII intact, strength 4/5 in RUE, RLE, strength 4/5 LUE, LLE; sensation intact bilateral; no dysmetria; babinski equivocal  Data Reviewed: No new data  Assessment and Plan: * Acute on chronic diastolic CHF (congestive heart failure) (HCC) Echocardiogram with preserved LV systolic function with EF 55%. Mild LVH, RV systolic function preserved, normal size LA and RA, with no significant valvular disease.    Patient not on diuretic therapy at home Plan to start patient on IV furosemide  60 mg IV bid Continue midodrine  and  fludrocortisone  for blood pressure support.    Acute hypoxemic and hypercapnic respiratory failure due to acute cardiogenic pulmonary edema.  Complicated with acute metabolic encephalopathy.    Plan to continue diuresis Non invasive mechanical ventilation with Bipap 14/6 with Fi02 30% tidal volumes generated around 600 ml. Aspiration precautions.    Paroxysmal atrial fibrillation (HCC) Continue telemetry monitoring  Continue digoxin   Anticoagulation with rivaroxaban .    Hypertension Continue blood pressure monitoring.  Continue midodrine  and fludrocortisone     CKD stage 3a, GFR 45-59 ml/min (HCC) Hyponatremia.    Continue diuresis with furosemide   Follow up renal function and electrolytes in am Avoid  hypotension and nephrotoxic medications    Cirrhosis of liver (HCC) Patient with encephalopathy, likely due to hypercapnia.  No signs of asterixis or ascites.  Trend LFT.    Obesity, class 3 Calculated BMI is 50.8    Acute on chronic HFpEF -11/07/23 Echo EF 55%. Mild LVH, RV systolic function preserved,  normal size LA and RA, with no significant valvular disease.  -Patient not on diuretic therapy at home because he reportedly ran out 1 week ago and not sure he wants to continue further medical care and focus more on hospice treatment.  -He is now on home hospice care and he confirmed with me on 6/9 that he wants to continue home hospice treatment  -Continue IV furosemide  60 mg IV bid today for symptom management -Initally on midodrine  and fludrocortisone  for blood pressure support>>now transition to comfort measures   Acute hypoxemic and hypercapnic respiratory failure  cardiogenic pulmonary edema.  - resolved after bipap treatment - he presented with 7.33/73/101/38 - Pt remains DNRDNI and pt wants to resume home hospice care  - Aspiration precautions.  - continue supplement oxygen for now   CKD stage 3b, -baseline creatinine 1.3-1.6   Digoxin  toxicity  - presented with elevated serum digoxin  levels - we have stopped giving it to allow for it to wash out - improved - he is having night time bradycardia spells   Hyponatremia - resolved    Chronic back pain/opioid dependence -Continue gabapentin  -PDMP reviewed -morphine  concentrate 02/01/24 -ativan  0.5 mg, 01/26/24   Obesity, class 3 Calculated BMI is 50.8    Cirrhosis of liver  Elevated Transaminases Due to hepatic congestion from CHF No signs of asterixis or ascites.  He is on home hospice care now     Permanent atrial fibrillation  Continue telemetry monitoring  He had been on digoxin  for this because he could not tolerate metoprolol  or other AV nodal blockers He is currently having bradycardic episodes and we will hold digoxin  to allow for it to wash out  Anticoagulation with rivaroxaban .  -now transitioned to comfort measures   Social -palliative medicine following -[facility] hospice consulted -anticipated life expectancy days to a week -[facility] accepted patient for hospice care for end of life care -due to lack of  bed availability, patient was transitioned to GIP status in the hospital       Advance Care Planning: FULL COMFORT  Consults: pallliative  Family Communication: none present  Severity of Illness: The appropriate patient status for this patient is INPATIENT. Inpatient status is judged to be reasonable and necessary in order to provide the required intensity of service to ensure the patient's safety. The patient's presenting symptoms, physical exam findings, and initial radiographic and laboratory data in the context of their chronic comorbidities is felt to place them at high risk for further clinical deterioration. Furthermore, it is not anticipated that  the patient will be medically stable for discharge from the hospital within 2 midnights of admission.   * I certify that at the point of admission it is my clinical judgment that the patient will require inpatient hospital care spanning beyond 2 midnights from the point of admission due to high intensity of service, high risk for further deterioration and high frequency of surveillance required.*  Author: Demaris Fillers, MD 02/11/2024 10:35 AM  For on call review [email or web address].

## 2024-02-11 NOTE — Plan of Care (Signed)

## 2024-02-11 NOTE — TOC Progression Note (Signed)
 Transition of Care [facility]) - Progression Note    Patient Details  Name: [name] MRN: [id number] Date of Birth: May 28, 1946  Transition of Care [facility]) CM/SW Contact  Cyndie Dredge, [place] Phone Number: 02/11/2024, 10:00 AM  Clinical Narrative:     Writer spoke with patient hospice case manager, Sherline Distel, this morning about her visit yesterday. Sherline Distel shared that patient was accepted and will be GIP in hospital since [facility] currently do not have any beds. Writer updated MD, Nurse, NT, and [facility]. TOC to follow.   Expected Discharge Plan: Hospice Medical Facility Barriers to Discharge: Hospice Bed not available  Expected Discharge Plan and Services In-house Referral: Hospice / Palliative Care   Post Acute Care Choice: Durable Medical Equipment, Hospice Living arrangements for the past 2 months: Single Family Home                 DME Arranged: N/A DME Agency: NA         HH Agency: [facility]         Social Determinants of Health (SDOH) Interventions SDOH Screenings   Food Insecurity: No Food Insecurity (02/07/2024)  Housing: Low Risk  (02/07/2024)  Transportation Needs: No Transportation Needs (02/07/2024)  Utilities: Not At Risk (02/07/2024)  Financial Resource Strain: Low Risk  (09/15/2023)   Received from [facility]  Physical Activity: Sufficiently Active (01/11/2023)   Received from [facility]  Social Connections: Moderately Isolated (02/07/2024)  Stress: No Stress Concern Present (10/08/2023)   Received from [facility]  Tobacco Use: Medium Risk (02/09/2024)    Readmission Risk Interventions    02/11/2024    9:58 AM 02/10/2024   10:04 AM 02/09/2024   11:48 AM  Readmission Risk Prevention Plan  Transportation Screening Complete Complete Complete  Medication Review Oceanographer) Complete Complete Complete  HRI or Home Care Consult Complete Complete Complete  SW Recovery Care/Counseling Consult Complete Complete  Complete  Palliative Care Screening Complete Complete Complete  Skilled Nursing Facility Not Applicable Not Applicable Not Applicable

## 2024-02-11 NOTE — Hospital Course (Addendum)
 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 Recently discharged from [facility] after a prolonged stay from 5/5 - 01/11/2024. He is currently on home hospice care. He recently contacted his cardiologist and requested not to be scheduled for further visits as he has transition to hospice care at home. He has chronic hypotension on midodrine . He presented to the emergency department 02/06/2024 complaining of increasing shortness of breath. He reported that he had run out of his diuretic about a week ago. He apparently was discharged on 80 mg of Lasix  daily but ran out about 1 week ago. He has had increasing edema in the lower extremities increasing shortness of breath and was placed on nonrebreather by EMS and subsequently ended up on BiPAP. He is DNR/DNI. He was found to have an abnormally elevated serum digoxin  level 2.4. His digoxin  was held with treatment. The patient was started on IV furosemide  60 mg twice daily.  He remained fluid overloaded and sob. The patient expressed that he wanted to resume hospice care. Palliative medicine was consulted. After discussions with the patient and daughter, the patient was transition to full comfort measures. TOC and social work assisted in transitioning the patient to the next venue hospice care.  -anticipated life expectancy days to a week -[facility] accepted patient for hospice care for end of life care -due to lack of bed availability, patient was transitioned to GIP status in the hospital. The patient was managed with Intensol, IV dilaudid  and IV lasix  for his pain and sob.  When administered appropriately, the patient was comfortable without distress.  On 02/16/24, there was a bed available at [facility] and the patient was transitioned there.

## 2024-02-12 DIAGNOSIS — Z515 Encounter for palliative care: Secondary | ICD-10-CM | POA: Diagnosis not present

## 2024-02-12 NOTE — Progress Notes (Addendum)
 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 Recently discharged from [facility] after a prolonged stay from 5/5 - 01/11/2024. He is currently on home hospice care. He recently contacted his cardiologist and requested not to be scheduled for further visits as he has transition to hospice care at home. He has chronic hypotension on midodrine . He presented to the emergency department 02/06/2024 complaining of increasing shortness of breath. He reported that he had run out of his diuretic about a week ago. He apparently was discharged on 80 mg of Lasix  daily but ran out about 1 week ago. He has had increasing edema in the lower extremities increasing shortness of breath and was placed on nonrebreather by EMS and subsequently ended up on BiPAP. He is DNR/DNI. He was found to have an abnormally elevated serum digoxin  level 2.4. His digoxin  was held with treatment. The patient was started on IV furosemide  60 mg twice daily.  He remained fluid overloaded and sob. The patient expressed that he wanted to resume hospice care. Palliative medicine was consulted. After discussions with the patient and daughter, the patient was transition to full comfort measures. TOC and social work assisted in transitioning the patient to the next venue hospice care.  -anticipated life expectancy days to a week -[name] accepted patient for hospice care for end of life care -due to lack of bed availability, patient was transitioned to GIP status in the hospital   Assessment/Plan: Acute on chronic HFpEF -11/07/23 Echo EF 55%. Mild LVH, RV systolic function preserved, normal size LA and RA, with no significant valvular  disease.  -Patient not on diuretic therapy at home because he reportedly ran out 1 week ago and not sure he wants to continue further medical care and focus more on hospice treatment.  Continue midodrine  and fludrocortisone  for blood pressure support.  -pt remained fluid overloaded and dyspneic despite IV lasix  -GOC discussions took place with palliative medicine -pt ultimately transitioned to full comfort measures -He is now on home hospice care and he confirmed with me on 6/9 that he wants to continue home hospice treatment  -Continued IV furosemide  60 mg IV bid  for symptom management until family arrives -Initally on midodrine  and fludrocortisone  for blood pressure support>>now transition to comfort measures   Acute hypoxemic and hypercapnic respiratory failure  cardiogenic pulmonary edema.  - resolved after bipap treatment - he presented with 7.33/73/101/38 - Pt remains DNRDNI and pt wants to resume home hospice care  - Aspiration precautions.  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  He is on home hospice care now  Permanent atrial fibrillation  Initially on telemetry monitoring  He had been on digoxin  for this because he could not tolerate metoprolol  or other AV nodal blockers Was having bradycardic episodes and we will hold digoxin  to allow for it to wash out  -initially on  Anticoagulation with rivaroxaban .  -now transitioned to comfort measures   Social -palliative medicine following -hopeful to transition to residential hospice if accepted -anticipated life expectancy days to week           Family Communication:  ex-wife at bedside 6/13  Consultants:  palliative  Code Status:  FULL COMFORT  DVT Prophylaxis:  FULL COMFORT   Procedures: As Listed in Progress Note Above  Antibiotics: None      Subjective: Pt complains of some cp and sob.  Denies n/v/d.  No uncontrolled pain  Objective: Vitals:   02/11/24 2004 02/12/24 1138 02/12/24 1145 02/12/24 1231  BP: (!) 121/49 131/61  (!) 116/48  Pulse: 81 (!) 38 (!) 133 73  Resp: 12     Temp: (!) 97.5 F (36.4 C) (!) 97.4 F (36.3 C)  98.2 F (36.8 C)  TempSrc: Oral Oral  Oral  SpO2: 94% 100%  100%    Intake/Output Summary (Last 24 hours) at 02/12/2024 1719 Last data filed at 02/12/2024 1221 Gross per 24 hour  Intake 360 ml  Output 1050 ml  Net -690 ml   Weight change:  Exam:  General:  Pt is alert, intermittently follows commands appropriately, not in acute distress; somnolent HEENT: No icterus, No thrush, No neck mass, [place]/AT Cardiovascular: IRRR, S1/S2, no rubs, no gallops Respiratory: bilateral crackles Abdomen: Soft/+BS, non tender, non distended, no guarding Extremities: 2 + LE edema, No lymphangitis, No petechiae, No rashes, no synovitis   Data Reviewed: I have personally reviewed following labs and imaging studies Basic Metabolic Panel: Recent Labs  Lab 02/06/24 1902 02/07/24 0410 02/08/24 0259 02/09/24 0441 02/10/24 0430  NA 133* 135 136 138 135  K 4.4 4.1 4.0 4.0 4.1  CL 92* 94* 95* 94* 94*  CO2 32 31 30 34* 32  GLUCOSE 143* 106* 106* 116* 116*  BUN 54* 54* 50* 51* 44*  CREATININE 2.54* 2.27* 1.83* 1.80* 1.66*  CALCIUM  8.0* 8.1* 8.1* 8.2* 8.3*  MG  --   --   --   --  2.7*   Liver Function Tests: Recent Labs  Lab 02/06/24 1902 02/07/24 0410  02/08/24 0259  AST 439* 369* 298*  ALT 403* 388* 362*  ALKPHOS 103 92 85  BILITOT 0.6 0.8 0.9  PROT 6.5 6.2* 5.8*  ALBUMIN  2.9* 2.8* 2.6*   No results for input(s): LIPASE, AMYLASE in the last 168 hours. Recent Labs  Lab 02/08/24 0259  AMMONIA 27   Coagulation Profile: No results for input(s): INR, PROTIME in the last 168 hours. CBC: Recent Labs  Lab 02/06/24 1902 02/07/24 0410 02/08/24 0259 02/10/24 0430  WBC 8.2 7.6 6.0 7.2  NEUTROABS 6.2  --   --   --   HGB 10.8* 11.1* 10.2* 11.2*  HCT 35.0* 36.3* 33.6* 36.5*  MCV 104.5* 102.8* 102.4* 105.8*  PLT 134* 123* 129* 124*   Cardiac Enzymes: No results for input(s): CKTOTAL, CKMB, CKMBINDEX, TROPONINI in the last 168 hours. BNP: Invalid input(s): POCBNP CBG: No results for input(s): GLUCAP in the last 168 hours. HbA1C: No results for input(s): HGBA1C in the last 72 hours. Urine analysis:    Component Value Date/Time   COLORURINE YELLOW 02/07/2024 0718   APPEARANCEUR CLEAR 02/07/2024  0718   APPEARANCEUR Clear 01/18/2018 1539   LABSPEC 1.010 02/07/2024 0718   PHURINE 5.0 02/07/2024 0718   GLUCOSEU NEGATIVE 02/07/2024 0718   HGBUR NEGATIVE 02/07/2024 0718   BILIRUBINUR NEGATIVE 02/07/2024 0718   BILIRUBINUR Negative 01/18/2018 1539   KETONESUR NEGATIVE 02/07/2024 0718   PROTEINUR NEGATIVE 02/07/2024 0718   UROBILINOGEN 0.2 09/06/2012 0213   NITRITE NEGATIVE 02/07/2024 0718   LEUKOCYTESUR NEGATIVE 02/07/2024 0718   Sepsis Labs: @LABRCNTIP (procalcitonin:4,lacticidven:4) )No results found for this or any previous visit (from the past 240 hours).   Scheduled Meds:  furosemide   60 mg Oral BID   oxyCODONE   5 mg Oral TID   Continuous Infusions:  Procedures/Studies: DG Chest Port 1 View Result Date: 02/09/2024 CLINICAL DATA:  Chest pain EXAM: PORTABLE CHEST 1 VIEW COMPARISON:  02/06/2024 FINDINGS: Cardiac shadow is enlarged but stable. Lungs are well aerated bilaterally. Increasing opacity  along the minor fissure is noted likely representing atelectasis. Trapped fluid could not be totally excluded. Small effusions are seen bilaterally. 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 Patchy areas of opacity are noted in the left lung likely related to scarring. IMPRESSION: New increase in opacity along the right minor fissure which may be related atelectasis. Chronic changes on the left are seen. Electronically Signed   By: Violeta Grey M.D.   On: 02/09/2024 19:09   DG Chest Port 1 View Result Date: 02/06/2024 CLINICAL DATA:  Short of breath EXAM: PORTABLE CHEST 1 VIEW COMPARISON:  12/21/2023 FINDINGS: Single frontal view of the chest demonstrates an enlarged cardiac silhouette. Lung volumes are diminished, with pulmonary vascular congestion and patchy bilateral airspace disease consistent with edema. Chronic left basilar scarring and pleural thickening. No pneumothorax. No acute bony abnormalities. IMPRESSION: 1. Constellation of findings most consistent with mild congestive heart failure. Electronically Signed   By: Bobbye Burrow M.D.   On: 02/06/2024 19:33    Demaris Fillers, DO  [facility]  If 7PM-7AM, please contact night-coverage [email or web address] Password [email or web address] 02/12/2024, 5:19 PM   LOS: 1 day

## 2024-02-12 NOTE — Progress Notes (Addendum)
   02/12/24 1159  Spiritual Encounters  Type of Visit Initial  Care provided to: Patient  Referral source Chaplain team  Reason for visit Routine spiritual support  OnCall Visit No  Spiritual Framework  Presenting Themes Meaning/purpose/sources of inspiration;Values and beliefs;Significant life change  Interventions  Spiritual Care Interventions Made Established relationship of care and support;Compassionate presence;Prayer;Explored values/beliefs/practices/strengths  Intervention Outcomes  Outcomes Connection to spiritual care;Reduced anxiety;Reduced isolation  Spiritual Care Plan  Spiritual Care Issues Still Outstanding Chaplain will continue to follow   Other Notes: Pt understands to some degree that he is dying and states I am ready to meet Jesus.

## 2024-02-12 NOTE — Plan of Care (Signed)
  Problem: Coping: Goal: Level of anxiety will decrease Outcome: Not Applicable

## 2024-02-13 DIAGNOSIS — Z515 Encounter for palliative care: Secondary | ICD-10-CM | POA: Diagnosis not present

## 2024-02-13 MED ORDER — LORAZEPAM 2 MG/ML IJ SOLN
0.5000 mg | INTRAMUSCULAR | Status: DC | PRN
  Administered 2024-02-13 – 2024-02-16 (×5): 0.5 mg via INTRAVENOUS
  Filled 2024-02-13 (×5): qty 1

## 2024-02-13 NOTE — Progress Notes (Signed)
 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 Recently discharged from [facility] after a prolonged stay from 5/5 - 01/11/2024. He is currently on home hospice care. He recently contacted his cardiologist and requested not to be scheduled for further visits as he has transition to hospice care at home. He has chronic hypotension on midodrine . He presented to the emergency department 02/06/2024 complaining of increasing shortness of breath. He reported that he had run out of his diuretic about a week ago. He apparently was discharged on 80 mg of Lasix  daily but ran out about 1 week ago. He has had increasing edema in the lower extremities increasing shortness of breath and was placed on nonrebreather by EMS and subsequently ended up on BiPAP. He is DNR/DNI. He was found to have an abnormally elevated serum digoxin  level 2.4. His digoxin  was held with treatment. The patient was started on IV furosemide  60 mg twice daily.  He remained fluid overloaded and sob. The patient expressed that he wanted to resume hospice care. Palliative medicine was consulted. After discussions with the patient and daughter, the patient was transition to full comfort measures. TOC and social work assisted in transitioning the patient to the next venue hospice care.  -anticipated life expectancy days to a week -[name] accepted patient for hospice care for end of life care -due to lack of bed availability, patient was transitioned to GIP status in the hospital   Assessment/Plan:  Acute on chronic HFpEF -11/07/23 Echo EF 55%. Mild LVH, RV systolic function preserved, normal size LA and RA, with no significant valvular  disease.  -Patient not on diuretic therapy at home because he reportedly ran out 1 week ago and not sure he wants to continue further medical care and focus more on hospice treatment.  Continue midodrine  and fludrocortisone  for blood pressure support.  -pt remained fluid overloaded and dyspneic despite IV lasix  -GOC discussions took place with palliative medicine -pt ultimately transitioned to full comfort measures -He is now on home hospice care and he confirmed with me on 6/9 that he wants to continue home hospice treatment  -Continued IV furosemide  60 mg IV bid  for symptom management until family arrives -Initally on midodrine  and fludrocortisone  for blood pressure support>>now transition to comfort measures   Acute hypoxemic and hypercapnic respiratory failure  cardiogenic pulmonary edema.  - resolved after bipap treatment - he presented with 7.33/73/101/38 - Pt remains DNRDNI and pt wants to resume home hospice care  - Aspiration precautions.  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  He is on home hospice care now  Permanent atrial fibrillation  Initially on telemetry monitoring  He had been on digoxin  for this because he could not tolerate metoprolol  or other AV nodal blockers Was having bradycardic episodes and we will hold digoxin  to allow for it to wash out  -initially on  Anticoagulation with rivaroxaban .  -now transitioned to comfort measures   Social -palliative medicine following -hopeful to transition to residential hospice if accepted -anticipated life expectancy days to week                     Family Communication:  ex-wife at bedside 6/13   Consultants:  palliative   Code Status:  FULL COMFORT   DVT Prophylaxis:  FULL COMFORT     Procedures: As Listed in Progress Note Above   Antibiotics: None        Subjective: Pt is awake, but confused.  No vomiting or resp distress  Objective: Vitals:   02/12/24 1138 02/12/24 1145 02/12/24 1231 02/12/24 1924  BP: 131/61  (!) 116/48 124/73  Pulse: (!) 38 (!) 133 73 93  Resp:    10  Temp: (!) 97.4 F (36.3 C)  98.2 F (36.8 C) 98.2 F (36.8 C)  TempSrc: Oral  Oral Oral  SpO2: 100%  100% 96%    Intake/Output Summary (Last 24 hours) at 02/13/2024 1705 Last data filed at 02/13/2024 0919 Gross per 24 hour  Intake 480 ml  Output 350 ml  Net 130 ml   Weight change:  Exam:  General:  Pt is alert, does not follows commands appropriately, not in acute distress HEENT: No icterus, No thrush, No neck mass, [place]/AT Cardiovascular: RRR, S1/S2, no rubs, no gallops Respiratory: bilateral crackles.  No wheeze Abdomen: Soft/+BS, non tender, non distended, no guarding Extremities: 2 + LE edema, No lymphangitis, No petechiae, No rashes, no synovitis   Data Reviewed: I have personally reviewed following labs and imaging studies Basic Metabolic Panel: Recent Labs  Lab 02/06/24 1902 02/07/24 0410 02/08/24 0259 02/09/24 0441 02/10/24 0430  NA 133* 135 136 138 135  K 4.4 4.1 4.0 4.0 4.1  CL 92* 94* 95* 94* 94*  CO2 32 31 30 34* 32  GLUCOSE 143* 106* 106* 116* 116*  BUN 54* 54* 50* 51* 44*  CREATININE 2.54* 2.27* 1.83* 1.80* 1.66*  CALCIUM  8.0* 8.1* 8.1* 8.2* 8.3*  MG  --   --   --   --  2.7*   Liver Function Tests: Recent Labs  Lab 02/06/24 1902 02/07/24 0410 02/08/24 0259   AST 439* 369* 298*  ALT 403* 388* 362*  ALKPHOS 103 92 85  BILITOT 0.6 0.8 0.9  PROT 6.5 6.2* 5.8*  ALBUMIN  2.9* 2.8* 2.6*   No results for input(s): LIPASE, AMYLASE in the last 168 hours. Recent Labs  Lab 02/08/24 0259  AMMONIA 27   Coagulation Profile: No results for input(s): INR, PROTIME in the last 168 hours. CBC: Recent Labs  Lab 02/06/24 1902 02/07/24 0410 02/08/24 0259 02/10/24 0430  WBC 8.2 7.6 6.0 7.2  NEUTROABS 6.2  --   --   --   HGB 10.8* 11.1* 10.2* 11.2*  HCT 35.0* 36.3* 33.6* 36.5*  MCV 104.5* 102.8* 102.4* 105.8*  PLT 134* 123* 129* 124*   Cardiac Enzymes: No results for input(s): CKTOTAL, CKMB, CKMBINDEX, TROPONINI in the last 168 hours. BNP: Invalid input(s): POCBNP CBG: No results for input(s): GLUCAP in the last 168 hours. HbA1C: No results for input(s): HGBA1C in the last 72 hours. Urine analysis:  Component Value Date/Time   COLORURINE YELLOW 02/07/2024 0718   APPEARANCEUR CLEAR 02/07/2024 0718   APPEARANCEUR Clear 01/18/2018 1539   LABSPEC 1.010 02/07/2024 0718   PHURINE 5.0 02/07/2024 0718   GLUCOSEU NEGATIVE 02/07/2024 0718   HGBUR NEGATIVE 02/07/2024 0718   BILIRUBINUR NEGATIVE 02/07/2024 0718   BILIRUBINUR Negative 01/18/2018 1539   KETONESUR NEGATIVE 02/07/2024 0718   PROTEINUR NEGATIVE 02/07/2024 0718   UROBILINOGEN 0.2 09/06/2012 0213   NITRITE NEGATIVE 02/07/2024 0718   LEUKOCYTESUR NEGATIVE 02/07/2024 0718   Sepsis Labs: @LABRCNTIP (procalcitonin:4,lacticidven:4) )No results found for this or any previous visit (from the past 240 hours).   Scheduled Meds:  furosemide   60 mg Oral BID   oxyCODONE   5 mg Oral TID   Continuous Infusions:  Procedures/Studies: DG Chest Port 1 View Result Date: 02/09/2024 CLINICAL DATA:  Chest pain EXAM: PORTABLE CHEST 1 VIEW COMPARISON:  02/06/2024 FINDINGS: Cardiac shadow is enlarged but stable. Lungs are well aerated bilaterally. Increasing opacity along the minor  fissure is noted likely representing atelectasis. Trapped fluid could not be totally excluded. Small effusions are seen bilaterally. 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 Patchy areas of opacity are noted in the left lung likely related to scarring. IMPRESSION: New increase in opacity along the right minor fissure which may be related atelectasis. Chronic changes on the left are seen. Electronically Signed   By: Violeta Grey M.D.   On: 02/09/2024 19:09   DG Chest Port 1 View Result Date: 02/06/2024 CLINICAL DATA:  Short of breath EXAM: PORTABLE CHEST 1 VIEW COMPARISON:  12/21/2023 FINDINGS: Single frontal view of the chest demonstrates an enlarged cardiac silhouette. Lung volumes are diminished, with pulmonary vascular congestion and patchy bilateral airspace disease consistent with edema. Chronic left basilar scarring and pleural thickening. No pneumothorax. No acute bony abnormalities. IMPRESSION: 1. Constellation of findings most consistent with mild congestive heart failure. Electronically Signed   By: Bobbye Burrow M.D.   On: 02/06/2024 19:33    Demaris Fillers, DO  [facility]  If 7PM-7AM, please contact night-coverage [email or web address] Password [email or web address] 02/13/2024, 5:05 PM   LOS: 2 days

## 2024-02-13 NOTE — Plan of Care (Signed)
  Problem: Education: Goal: Knowledge of the prescribed therapeutic regimen will improve Outcome: Progressing   Problem: Coping: Goal: Ability to identify and develop effective coping behavior will improve Outcome: Progressing   Problem: Respiratory: Goal: Verbalizations of increased ease of respirations will increase 02/13/2024 1618 by Annis Baseman, RN Outcome: Progressing 02/13/2024 1617 by Annis Baseman, RN Outcome: Progressing   Problem: Role Relationship: Goal: Family's ability to cope with current situation will improve 02/13/2024 1618 by Annis Baseman, RN Outcome: Progressing 02/13/2024 1617 by Annis Baseman, RN Outcome: Progressing Goal: Ability to verbalize concerns, feelings, and thoughts to partner or family member will improve Outcome: Progressing    Managing symptoms of agitation, shortness of breath, and pain today. Family present. Patient minimally responsive and confused, pulling oxygen off at times and trying to pull off external urinary catheter. Giving meds as needed for agitation and pain.

## 2024-02-14 DIAGNOSIS — Z515 Encounter for palliative care: Secondary | ICD-10-CM | POA: Diagnosis not present

## 2024-02-14 NOTE — Progress Notes (Signed)
 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 Recently discharged from [facility] after a prolonged stay from 5/5 - 01/11/2024. He is currently on home hospice care. He recently contacted his cardiologist and requested not to be scheduled for further visits as he has transition to hospice care at home. He has chronic hypotension on midodrine . He presented to the emergency department 02/06/2024 complaining of increasing shortness of breath. He reported that he had run out of his diuretic about a week ago. He apparently was discharged on 80 mg of Lasix  daily but ran out about 1 week ago. He has had increasing edema in the lower extremities increasing shortness of breath and was placed on nonrebreather by EMS and subsequently ended up on BiPAP. He is DNR/DNI. He was found to have an abnormally elevated serum digoxin  level 2.4. His digoxin  was held with treatment. The patient was started on IV furosemide  60 mg twice daily.  He remained fluid overloaded and sob. The patient expressed that he wanted to resume hospice care. Palliative medicine was consulted. After discussions with the patient and daughter, the patient was transition to full comfort measures. TOC and social work assisted in transitioning the patient to the next venue hospice care.  -anticipated life expectancy days to a week -[name] accepted patient for hospice care for end of life care -due to lack of bed availability, patient was transitioned to GIP status in the hospital   Assessment/Plan:  Acute on chronic HFpEF -11/07/23 Echo EF 55%. Mild LVH, RV systolic function preserved, normal size LA and RA, with no significant valvular  disease.  -Patient not on diuretic therapy at home because he reportedly ran out 1 week ago and not sure he wants to continue further medical care and focus more on hospice treatment.  Continue midodrine  and fludrocortisone  for blood pressure support.  -pt remained fluid overloaded and dyspneic despite IV lasix  -GOC discussions took place with palliative medicine -pt ultimately transitioned to full comfort measures -He is now on home hospice care and he confirmed with me on 6/9 that he wants to continue home hospice treatment  -Continued IV furosemide  60 mg IV bid  for symptom management until family arrives -Initally on midodrine  and fludrocortisone  for blood pressure support>>now transition to comfort measures   Acute hypoxemic and hypercapnic respiratory failure  cardiogenic pulmonary edema.  - resolved after bipap treatment - he presented with 7.33/73/101/38 - Pt remains DNRDNI and pt wants to resume home hospice care  - Aspiration precautions.  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  He is on home hospice care now  Permanent atrial fibrillation  Initially on telemetry monitoring  He had been on digoxin  for this because he could not tolerate metoprolol  or other AV nodal blockers Was having bradycardic episodes and we will hold digoxin  to allow for it to wash out  -initially on  Anticoagulation with rivaroxaban .  -now transitioned to comfort measures   Social -palliative medicine following -hopeful to transition to residential hospice if accepted -anticipated life expectancy weeks                     Family Communication:  ex-wife at bedside 6/13   Consultants:  palliative   Code Status:  FULL COMFORT   DVT Prophylaxis:  FULL COMFORT     Procedures: As Listed in Progress Note Above   Antibiotics: None         Subjective: Pt is pleasantly confused.  Has some sob.  Denies cp, abd pain. Objective: Vitals:   02/12/24 1231 02/12/24 1924 02/14/24 0405 02/14/24 1300  BP: (!) 116/48 124/73 (!) 143/68 (!) 111/48  Pulse: 73 93 92 75  Resp:  10 16 16   Temp: 98.2 F (36.8 C) 98.2 F (36.8 C) 98.4 F (36.9 C) 98.5 F (36.9 C)  TempSrc: Oral Oral Oral Oral  SpO2: 100% 96% 100% 100%    Intake/Output Summary (Last 24 hours) at 02/14/2024 1651 Last data filed at 02/14/2024 1300 Gross per 24 hour  Intake 240 ml  Output 1475 ml  Net -1235 ml   Weight change:  Exam:  General:  Pt is alert, intermittently follows commands appropriately, not in acute distress HEENT: No icterus, No thrush, No neck mass, [place]/AT Cardiovascular: RRR, S1/S2, no rubs, no gallops Respiratory: bilateral crackles. No wheeze Abdomen: Soft/+BS, non tender, non distended, no guarding Extremities: 2 + LE edema, No lymphangitis, No petechiae, No rashes, no synovitis   Data Reviewed: I have personally reviewed following labs and imaging studies Basic Metabolic Panel: Recent Labs  Lab 02/08/24 0259 02/09/24 0441 02/10/24 0430  NA 136 138 135  K 4.0 4.0 4.1  CL 95* 94* 94*  CO2 30 34* 32  GLUCOSE 106* 116* 116*  BUN 50* 51* 44*  CREATININE 1.83* 1.80* 1.66*  CALCIUM  8.1* 8.2* 8.3*  MG  --   --  2.7*   Liver Function Tests: Recent Labs  Lab 02/08/24 0259  AST 298*  ALT 362*  ALKPHOS 85  BILITOT 0.9  PROT 5.8*  ALBUMIN  2.6*   No results for input(s):  LIPASE, AMYLASE in the last 168 hours. Recent Labs  Lab 02/08/24 0259  AMMONIA 27   Coagulation Profile: No results for input(s): INR, PROTIME in the last 168 hours. CBC: Recent Labs  Lab 02/08/24 0259 02/10/24 0430  WBC 6.0 7.2  HGB 10.2* 11.2*  HCT 33.6* 36.5*  MCV 102.4* 105.8*  PLT 129* 124*   Cardiac Enzymes: No results for input(s): CKTOTAL, CKMB, CKMBINDEX, TROPONINI in the last 168 hours. BNP: Invalid input(s): POCBNP CBG: No results for input(s): GLUCAP in the last 168 hours. HbA1C: No results for input(s): HGBA1C in the last 72 hours. Urine analysis:    Component Value Date/Time   COLORURINE YELLOW 02/07/2024 0718   APPEARANCEUR CLEAR 02/07/2024 0718   APPEARANCEUR Clear 01/18/2018 1539   LABSPEC 1.010 02/07/2024 0718   PHURINE 5.0 02/07/2024 0718   GLUCOSEU NEGATIVE 02/07/2024 0718   HGBUR NEGATIVE 02/07/2024 0718   BILIRUBINUR NEGATIVE 02/07/2024 0718   BILIRUBINUR Negative 01/18/2018 1539   KETONESUR NEGATIVE 02/07/2024 0718   PROTEINUR  NEGATIVE 02/07/2024 0718   UROBILINOGEN 0.2 09/06/2012 0213   NITRITE NEGATIVE 02/07/2024 0718   LEUKOCYTESUR NEGATIVE 02/07/2024 0718   Sepsis Labs: @LABRCNTIP (procalcitonin:4,lacticidven:4) )No results found for this or any previous visit (from the past 240 hours).   Scheduled Meds:  furosemide   60 mg Oral BID   oxyCODONE   5 mg Oral TID   Continuous Infusions:  Procedures/Studies: DG Chest Port 1 View Result Date: 02/09/2024 CLINICAL DATA:  Chest pain EXAM: PORTABLE CHEST 1 VIEW COMPARISON:  02/06/2024 FINDINGS: Cardiac shadow is enlarged but stable. Lungs are well aerated bilaterally. Increasing opacity along the minor fissure is noted likely representing atelectasis. Trapped fluid could not be totally excluded. Small effusions are seen bilaterally. 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 Patchy areas of opacity are noted in the left lung likely related to scarring. IMPRESSION: New  increase in opacity along the right minor fissure which may be related atelectasis. Chronic changes on the left are seen. Electronically Signed   By: Violeta Grey M.D.   On: 02/09/2024 19:09   DG Chest Port 1 View Result Date: 02/06/2024 CLINICAL DATA:  Short of breath EXAM: PORTABLE CHEST 1 VIEW COMPARISON:  12/21/2023 FINDINGS: Single frontal view of the chest demonstrates an enlarged cardiac silhouette. Lung volumes are diminished, with pulmonary vascular congestion and patchy bilateral airspace disease consistent with edema. Chronic left basilar scarring and pleural thickening. No pneumothorax. No acute bony abnormalities. IMPRESSION: 1. Constellation of findings most consistent with mild congestive heart failure. Electronically Signed   By: Bobbye Burrow M.D.   On: 02/06/2024 19:33    Demaris Fillers, DO  [facility]  If 7PM-7AM, please contact night-coverage [email or web address] Password [email or web address] 02/14/2024, 4:51 PM   LOS: 3 days

## 2024-02-14 NOTE — Plan of Care (Signed)
  Problem: Clinical Measurements: Goal: Quality of life will improve Outcome: Not Applicable   Problem: Clinical Measurements: Goal: Quality of life will improve Outcome: Not Applicable   Problem: Role Relationship: Goal: Family's ability to cope with current situation will improve Outcome: Not Applicable   Problem: Role Relationship: Goal: Ability to verbalize concerns, feelings, and thoughts to partner or family member will improve Outcome: Not Applicable

## 2024-02-15 MED ORDER — NYSTATIN 100000 UNIT/GM EX POWD
Freq: Three times a day (TID) | CUTANEOUS | Status: DC
  Administered 2024-02-15: 1 via TOPICAL
  Filled 2024-02-15: qty 15

## 2024-02-15 NOTE — Progress Notes (Signed)
 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 Recently discharged from [facility] after a prolonged stay from 5/5 - 01/11/2024. He is currently on home hospice care. He recently contacted his cardiologist and requested not to be scheduled for further visits as he has transition to hospice care at home. He has chronic hypotension on midodrine . He presented to the emergency department 02/06/2024 complaining of increasing shortness of breath. He reported that he had run out of his diuretic about a week ago. He apparently was discharged on 80 mg of Lasix  daily but ran out about 1 week ago. He has had increasing edema in the lower extremities increasing shortness of breath and was placed on nonrebreather by EMS and subsequently ended up on BiPAP. He is DNR/DNI. He was found to have an abnormally elevated serum digoxin  level 2.4. His digoxin  was held with treatment. The patient was started on IV furosemide  60 mg twice daily.  He remained fluid overloaded and sob. The patient expressed that he wanted to resume hospice care. Palliative medicine was consulted. After discussions with the patient and daughter, the patient was transition to full comfort measures. TOC and social work assisted in transitioning the patient to the next venue hospice care.  -anticipated life expectancy days to a week -[name] accepted patient for hospice care for end of life care -due to lack of bed availability, patient was transitioned to GIP status in the hospital   Assessment/Plan: Acute on chronic HFpEF -11/07/23 Echo EF 55%. Mild LVH, RV systolic function preserved, normal size LA and RA, with no significant valvular  disease.  -Patient not on diuretic therapy at home because he reportedly ran out 1 week ago and not sure he wants to continue further medical care and focus more on hospice treatment.  Continue midodrine  and fludrocortisone  for blood pressure support.  -pt remained fluid overloaded and dyspneic despite IV lasix  -GOC discussions took place with palliative medicine -pt ultimately transitioned to full comfort measures -He is now on home hospice care and he confirmed with me on 6/9 that he wants to continue home hospice treatment  -Continued IV furosemide  60 mg IV bid  for symptom management until family arrives -Initally on midodrine  and fludrocortisone  for blood pressure support>>now transition to comfort measures   Acute hypoxemic and hypercapnic respiratory failure  cardiogenic pulmonary edema.  - resolved after bipap treatment - he presented with 7.33/73/101/38 - Pt remains DNRDNI and pt wants to resume home hospice care  - Aspiration precautions.  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  He is on home hospice care now  Permanent atrial fibrillation  Initially on telemetry monitoring  He had been on digoxin  for this because he could not tolerate metoprolol  or other AV nodal blockers Was having bradycardic episodes and we will hold digoxin  to allow for it to wash out  -initially on  Anticoagulation with rivaroxaban .  -now transitioned to comfort measures   Social -palliative medicine following -hopeful to transition to residential hospice if accepted -anticipated life expectancy weeks                     Family Communication:  ex-wife at bedside 6/13   Consultants:  palliative   Code Status:  FULL COMFORT   DVT Prophylaxis:  FULL COMFORT     Procedures: As Listed in Progress Note Above   Antibiotics: None     Subjective:  Pt is more comfortable after appropriate medication.  No distress or uncontrolled pain Objective: Vitals:   02/14/24 0405 02/14/24 1300 02/14/24 2023 02/15/24 0324  BP: (!) 143/68 (!) 111/48 (!) 141/55 (!) 144/60  Pulse: 92 75 83 85  Resp: 16 16 17 16   Temp: 98.4 F (36.9 C) 98.5 F (36.9 C) (!) 97.5 F (36.4 C) 98.4 F (36.9 C)  TempSrc: Oral Oral Axillary Oral  SpO2: 100% 100% 100% 91%    Intake/Output Summary (Last 24 hours) at 02/15/2024 1712 Last data filed at 02/15/2024 1637 Gross per 24 hour  Intake --  Output 400 ml  Net -400 ml   Weight change:  Exam:  General:  Pt is alert, intermittently follows commands appropriately, not in acute distress HEENT: No icterus, No thrush, No neck mass, [place]/AT Cardiovascular: RRR, S1/S2, no rubs, no gallops Respiratory: bilateral crackles Abdomen: Soft/+BS, non tender, non distended, no guarding Extremities: 2 + LE edema, No lymphangitis, No petechiae, No rashes, no synovitis   Data Reviewed: I have personally reviewed following labs and imaging studies Basic Metabolic Panel: Recent Labs  Lab 02/09/24 0441 02/10/24 0430  NA 138 135  K 4.0 4.1  CL 94* 94*  CO2 34* 32  GLUCOSE 116* 116*  BUN 51* 44*  CREATININE 1.80* 1.66*  CALCIUM  8.2* 8.3*  MG  --  2.7*   Liver Function Tests: No results for input(s): AST, ALT, ALKPHOS, BILITOT, PROT, ALBUMIN  in the last 168 hours. No results for input(s): LIPASE, AMYLASE in the last 168 hours. No  results for input(s): AMMONIA in the last 168 hours. Coagulation Profile: No results for input(s): INR, PROTIME in the last 168 hours. CBC: Recent Labs  Lab 02/10/24 0430  WBC 7.2  HGB 11.2*  HCT 36.5*  MCV 105.8*  PLT 124*   Cardiac Enzymes: No results for input(s): CKTOTAL, CKMB, CKMBINDEX, TROPONINI in the last 168 hours. BNP: Invalid input(s): POCBNP CBG: No results for input(s): GLUCAP in the last 168 hours. HbA1C: No results for input(s): HGBA1C in the last 72 hours. Urine analysis:    Component Value Date/Time   COLORURINE YELLOW 02/07/2024 0718   APPEARANCEUR CLEAR 02/07/2024 0718   APPEARANCEUR Clear 01/18/2018 1539   LABSPEC 1.010 02/07/2024 0718   PHURINE 5.0 02/07/2024 0718   GLUCOSEU NEGATIVE 02/07/2024 0718   HGBUR NEGATIVE 02/07/2024 0718   BILIRUBINUR NEGATIVE 02/07/2024 0718   BILIRUBINUR Negative 01/18/2018 1539   KETONESUR NEGATIVE 02/07/2024 0718   PROTEINUR NEGATIVE 02/07/2024 0718   UROBILINOGEN 0.2 09/06/2012 0213   NITRITE NEGATIVE 02/07/2024 0718   LEUKOCYTESUR NEGATIVE 02/07/2024 0718   Sepsis Labs: @LABRCNTIP (procalcitonin:4,lacticidven:4) )No results found for this or any previous visit (from  the past 240 hours).   Scheduled Meds:  furosemide   60 mg Oral BID   nystatin    Topical TID   oxyCODONE   5 mg Oral TID   Continuous Infusions:  Procedures/Studies: DG Chest Port 1 View Result Date: 02/09/2024 CLINICAL DATA:  Chest pain EXAM: PORTABLE CHEST 1 VIEW COMPARISON:  02/06/2024 FINDINGS: Cardiac shadow is enlarged but stable. Lungs are well aerated bilaterally. Increasing opacity along the minor fissure is noted likely representing atelectasis. Trapped fluid could not be totally excluded. Small effusions are seen bilaterally. 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 Patchy areas of opacity are noted in the left lung likely related to scarring. IMPRESSION: New increase in opacity along the right minor fissure which may  be related atelectasis. Chronic changes on the left are seen. Electronically Signed   By: Violeta Grey M.D.   On: 02/09/2024 19:09   DG Chest Port 1 View Result Date: 02/06/2024 CLINICAL DATA:  Short of breath EXAM: PORTABLE CHEST 1 VIEW COMPARISON:  12/21/2023 FINDINGS: Single frontal view of the chest demonstrates an enlarged cardiac silhouette. Lung volumes are diminished, with pulmonary vascular congestion and patchy bilateral airspace disease consistent with edema. Chronic left basilar scarring and pleural thickening. No pneumothorax. No acute bony abnormalities. IMPRESSION: 1. Constellation of findings most consistent with mild congestive heart failure. Electronically Signed   By: Bobbye Burrow M.D.   On: 02/06/2024 19:33    Demaris Fillers, DO  [facility]  If 7PM-7AM, please contact night-coverage [email or web address] Password [email or web address] 02/15/2024, 5:12 PM   LOS: 4 days

## 2024-02-15 NOTE — Progress Notes (Signed)
 Pt has been increasingly restless today with periods of increased resp difficulty. Pain and anxiety meds have been given with increased frequency than previous shifts due to pt discomfort, resp difficulty and restlessness. Palliative care nurse updated on same. Hospice nurse in this am to evaluate pt, states still no available bed. Family members to bedside and updated on events of the day and pt condition and increased use of pain and anxiety meds to keep pt comfortable. Questions answered to their satisfaction. Pt with noted ITD under bilateral breasts and bilater groin (R>L), areas bleeding and very painful to touch. Areas cleaned and nystatin  powder applied, pt appeared more comfortable post application.

## 2024-02-16 DIAGNOSIS — Z7189 Other specified counseling: Secondary | ICD-10-CM

## 2024-02-16 DIAGNOSIS — Z515 Encounter for palliative care: Secondary | ICD-10-CM | POA: Diagnosis not present

## 2024-02-16 NOTE — Progress Notes (Signed)
 Spoke with pt's wife re current condition and plan for discharge to [facility] today. She states understanding.

## 2024-02-16 NOTE — TOC Transition Note (Signed)
 Transition of Care [facility]) - Discharge Note   Patient Details  Name: [name] MRN: [id number] Date of Birth: [birth year]  Transition of Care [facility]) CM/SW Contact:  Ander Katos, LCSW Phone Number: 02/16/2024, 11:21 AM   Clinical Narrative:  Bed is available at [facility] today. Pt's daughter notified and agreeable. Hospice will arrange EMS transport. RN given number to call report. D/C summary sent to [facility].      Final next level of care: Hospice Medical Facility Barriers to Discharge: Barriers Resolved   Patient Goals and CMS Choice Patient states their goals for this hospitalization and ongoing recovery are:: DC to [facility] once a bed is open CMS Medicare.gov Compare Post Acute Care list provided to:: Patient Choice offered to / list presented to : Adult Children      Discharge Placement                Patient to be transferred to facility by: [facility] Name of family member notified: daughter Patient and family notified of of transfer: 02/16/24  Discharge Plan and Services Additional resources added to the After Visit Summary for   In-house Referral: Clinical Social Work Discharge Planning Services: CM Consult Post Acute Care Choice: Durable Medical Equipment, Hospice          DME Arranged: N/A           HH Agency: [facility]        Social Drivers of Health (SDOH) Interventions SDOH Screenings   Food Insecurity: No Food Insecurity (02/12/2024)  Housing: Unknown (02/12/2024)  Transportation Needs: No Transportation Needs (02/12/2024)  Utilities: Not At Risk (02/12/2024)  Financial Resource Strain: Low Risk  (09/15/2023)   Received from [facility]  Physical Activity: Sufficiently Active (01/11/2023)   Received from [facility]  Social Connections: Moderately Isolated (02/12/2024)  Stress: No Stress Concern Present (10/08/2023)   Received from [facility]  Tobacco Use: Medium Risk (02/09/2024)     Readmission  Risk Interventions    02/15/2024   11:32 AM 02/12/2024   10:33 AM 02/11/2024    2:07 PM  Readmission Risk Prevention Plan  Transportation Screening Complete Complete Complete  Medication Review Oceanographer) Complete Complete Complete  HRI or Home Care Consult Complete Complete Complete  SW Recovery Care/Counseling Consult Complete Complete Complete  Palliative Care Screening Complete Complete Complete  Skilled Nursing Facility Not Applicable Not Applicable Not Applicable

## 2024-02-16 NOTE — Discharge Summary (Signed)
 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 Recently discharged from [facility] after a prolonged stay from 5/5 - 01/11/2024. He is currently on home hospice care. He recently contacted his cardiologist and requested not to be scheduled for further visits as he has transition to hospice care at home. He has chronic hypotension on midodrine . He presented to the emergency department 02/06/2024 complaining of increasing shortness of breath. He reported that he had run out of his diuretic about a week ago. He apparently was discharged on 80 mg of Lasix  daily but ran out about 1 week ago. He has had increasing edema in the lower extremities increasing shortness of breath and was placed on nonrebreather by EMS and subsequently ended up on BiPAP. He is DNR/DNI. He was found to have an abnormally elevated serum digoxin  level 2.4. His digoxin  was held with treatment. The patient was started on IV furosemide  60 mg twice daily.  He remained fluid overloaded and sob. The patient expressed that he wanted to resume hospice care. Palliative medicine was consulted. After discussions with the patient and daughter, the patient was transition to full comfort measures. TOC and social work assisted in transitioning the patient to the next venue hospice care.  -anticipated life expectancy days to a week -[facility] accepted patient for hospice care for end of life care -due to lack of bed availability, patient was transitioned to GIP status in the hospital. The patient was  managed with Intensol, IV dilaudid  and IV lasix  for his pain and sob.  When administered appropriately, the patient was comfortable without distress.  On 02/16/24, there was a bed available at [facility] and the patient was transitioned there.  Assessment and Plan: Acute on chronic HFpEF -11/07/23 Echo EF 55%. Mild LVH, RV systolic function preserved, normal size LA and RA, with no significant valvular disease.  -Patient not on diuretic therapy at home because he reportedly ran out 1 week ago and not sure he wants to continue further medical care and focus more on hospice treatment.  Continue midodrine  and fludrocortisone  for blood pressure support.  -pt remained fluid overloaded and dyspneic despite IV lasix  -GOC discussions took place with palliative medicine -pt ultimately transitioned to full comfort measures -He is now on home hospice care and he confirmed with me on 6/9 that he wants to continue home hospice treatment  -Continued IV furosemide  60 mg IV bid  for symptom management until family arrives -Initally on midodrine  and fludrocortisone  for blood pressure support>>now transition to comfort measures   Acute hypoxemic and hypercapnic respiratory failure  cardiogenic pulmonary edema.  - resolved after bipap treatment - he presented with 7.33/73/101/38 - Pt remains DNRDNI and pt wants to resume home hospice care  - Aspiration precautions.  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  He is on home hospice care now     Permanent atrial fibrillation  Initially on telemetry monitoring  He had been on digoxin  for this because he could not tolerate metoprolol  or other AV nodal blockers Was having bradycardic episodes and we will hold digoxin  to allow for it to wash out  -initially on Anticoagulation with rivaroxaban .  -now transitioned to comfort measures   Social -palliative medicine following -pt transitioned to comfort measures -pt accepted to residential hospice by [name], but no bed available -pt transitioned to inpatient hosice admission -anticipated life expectancy days to week -6/17 bed available at [facility]      Consultants: palliative Procedures performed: none  Disposition: Hospice care Diet recommendation:  Comfort feeds DISCHARGE MEDICATION: Allergies as of 02/16/2024       Reactions   Bee Venom Anaphylaxis, Other (See Comments)   Noted from . honey bee venom   Nickel Rash, Other (See Comments)   Breaks me out        Medication List     STOP taking these medications    furosemide  10 MG/ML injection Commonly known as: LASIX    HYDROcodone -acetaminophen  10-325 MG tablet Commonly known as: NORCO   LORazepam  0.5 MG tablet Commonly known as: ATIVAN    melatonin 3 MG Tabs tablet   morphine  (PF) 2 MG/ML injection   oxyCODONE  20 MG/ML concentrated solution Commonly known as: ROXICODONE  INTENSOL   OXYGEN       TAKE these medications    acetaminophen  500 MG tablet Commonly known as: TYLENOL  Take 1,000 mg by mouth as needed for mild pain (pain score 1-3) or headache.        Discharge Exam: HEENT:  [place]/AT, No thrush, no icterus CV:  RRR, no rub, no S3, no S4 Lung:  bilateral rales.  No wheeze Abd:  soft/+BS, NT Ext:  2 +LE  edema, no lymphangitis, no synovitis, no rash   Condition at discharge:  stable  The results of significant diagnostics from this hospitalization (including imaging, microbiology, ancillary and laboratory) are listed below for reference.   Imaging Studies: DG Chest Port 1 View Result Date: 02/09/2024 CLINICAL DATA:  Chest pain EXAM: PORTABLE CHEST 1 VIEW COMPARISON:  02/06/2024 FINDINGS: Cardiac shadow is enlarged but stable. Lungs are well aerated bilaterally. Increasing opacity along the minor fissure is noted likely representing atelectasis. Trapped fluid could not be totally excluded. Small effusions are seen bilaterally. 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 Patchy areas of opacity are noted in the left lung likely related to scarring. IMPRESSION: New increase in opacity along the right minor fissure which may be related atelectasis. Chronic changes on the left are seen. Electronically Signed   By: Violeta Grey M.D.   On: 02/09/2024 19:09   DG Chest Port 1 View Result Date: 02/06/2024 CLINICAL DATA:  Short of breath EXAM: PORTABLE CHEST 1 VIEW COMPARISON:  12/21/2023 FINDINGS: Single frontal view of the chest demonstrates an enlarged cardiac silhouette. Lung volumes are diminished, with pulmonary vascular congestion and patchy bilateral airspace disease consistent with edema. Chronic left basilar scarring and pleural thickening. No pneumothorax. No acute bony abnormalities. IMPRESSION: 1. Constellation of findings most consistent with  mild congestive heart failure. Electronically Signed   By: Bobbye Burrow M.D.   On: 02/06/2024 19:33    Microbiology: Results for orders placed or performed during the hospital encounter of 12/21/23  MRSA Next Gen by PCR, Nasal     Status: Abnormal   Collection Time: 12/22/23  3:24 PM   Specimen: Nasal Mucosa; Nasal Swab  Result Value Ref Range Status   MRSA by PCR Next Gen DETECTED (A) NOT DETECTED Final    Comment: RESULT CALLED TO, READ BACK BY AND VERIFIED WITH: RN Lafonda Piety [phone] @[id number]  FH (NOTE) The GeneXpert MRSA  Assay (FDA approved for NASAL specimens only), is one component of a comprehensive MRSA colonization surveillance program. It is not intended to diagnose MRSA infection nor to guide or monitor treatment for MRSA infections. Test performance is not FDA approved in patients less than 43 years old. Performed at [facility], [address], [place], [place] [address]     Labs: CBC: Recent Labs  Lab 02/10/24 0430  WBC 7.2  HGB 11.2*  HCT 36.5*  MCV 105.8*  PLT 124*   Basic Metabolic Panel: Recent Labs  Lab 02/10/24 0430  NA 135  K 4.1  CL 94*  CO2 32  GLUCOSE 116*  BUN 44*  CREATININE 1.66*  CALCIUM  8.3*  MG 2.7*   Liver Function Tests: No results for input(s): AST, ALT, ALKPHOS, BILITOT, PROT, ALBUMIN  in the last 168 hours. CBG: No results for input(s): GLUCAP in the last 168 hours.  Discharge time spent: greater than 30 minutes.  Signed: Demaris Fillers, MD [facility] 02/16/2024

## 2024-02-16 NOTE — Progress Notes (Signed)
 Palliative: Mr. Temme is lying quietly in bed.  He is full comfort care and appears to be comfortable, overall.  He does not attempt to interact with me in any meaningful way and clearly cannot make his basic needs known.  There is no family at bedside at this time.  Face-to-face discussion with bedside nursing staff related to patient condition, needs.  Mr. Romanello has been offered a bed at [facility] and will transfer today.  Symptom management needs discussed with bedside nursing staff.  Conference with attending, bedside nursing staff, transition of care team related to patient condition, needs, goals of care, disposition.  Plan: [facility] end-of-life, residential hospice at [facility].  DNR/goldenrod form completed and placed on chart.    25 minutes  Kaisa Wofford, NP Palliative medicine team Team phone [phone]

## 2024-02-16 NOTE — Progress Notes (Signed)
 Pt discharged to [facility] via [place]. Pt's belongings sent with patient.

## 2024-02-18 ENCOUNTER — Other Ambulatory Visit (HOSPITAL_COMMUNITY)

## 2024-02-25 ENCOUNTER — Ambulatory Visit: Admitting: Hematology

## 2024-03-01 DEATH — deceased

## 2024-03-02 ENCOUNTER — Ambulatory Visit: Admitting: Hematology
